# Patient Record
Sex: Male | Born: 1945 | Race: Black or African American | Hispanic: No | Marital: Married | Smoking: Former smoker
Health system: Southern US, Community
[De-identification: ages and names within clinical notes are randomized; demographics above are authoritative.]

## PROBLEM LIST (undated history)

## (undated) DIAGNOSIS — R61 Generalized hyperhidrosis: Secondary | ICD-10-CM

## (undated) DIAGNOSIS — R1013 Epigastric pain: Secondary | ICD-10-CM

## (undated) DIAGNOSIS — C61 Malignant neoplasm of prostate: Secondary | ICD-10-CM

## (undated) DIAGNOSIS — C169 Malignant neoplasm of stomach, unspecified: Secondary | ICD-10-CM

## (undated) DIAGNOSIS — M549 Dorsalgia, unspecified: Secondary | ICD-10-CM

## (undated) DIAGNOSIS — M1990 Unspecified osteoarthritis, unspecified site: Secondary | ICD-10-CM

## (undated) DIAGNOSIS — R112 Nausea with vomiting, unspecified: Secondary | ICD-10-CM

## (undated) DIAGNOSIS — N529 Male erectile dysfunction, unspecified: Secondary | ICD-10-CM

## (undated) DIAGNOSIS — K279 Peptic ulcer, site unspecified, unspecified as acute or chronic, without hemorrhage or perforation: Secondary | ICD-10-CM

## (undated) DIAGNOSIS — N138 Other obstructive and reflux uropathy: Secondary | ICD-10-CM

## (undated) DIAGNOSIS — D649 Anemia, unspecified: Secondary | ICD-10-CM

## (undated) DIAGNOSIS — F329 Major depressive disorder, single episode, unspecified: Secondary | ICD-10-CM

## (undated) DIAGNOSIS — Z923 Personal history of irradiation: Secondary | ICD-10-CM

## (undated) DIAGNOSIS — E7800 Pure hypercholesterolemia, unspecified: Secondary | ICD-10-CM

## (undated) DIAGNOSIS — R12 Heartburn: Secondary | ICD-10-CM

## (undated) DIAGNOSIS — R0902 Hypoxemia: Secondary | ICD-10-CM

## (undated) DIAGNOSIS — F419 Anxiety disorder, unspecified: Secondary | ICD-10-CM

## (undated) DIAGNOSIS — K219 Gastro-esophageal reflux disease without esophagitis: Secondary | ICD-10-CM

## (undated) DIAGNOSIS — I10 Essential (primary) hypertension: Secondary | ICD-10-CM

## (undated) DIAGNOSIS — Z9289 Personal history of other medical treatment: Secondary | ICD-10-CM

## (undated) DIAGNOSIS — IMO0002 Reserved for concepts with insufficient information to code with codable children: Secondary | ICD-10-CM

## (undated) DIAGNOSIS — R0600 Dyspnea, unspecified: Secondary | ICD-10-CM

## (undated) DIAGNOSIS — G4730 Sleep apnea, unspecified: Secondary | ICD-10-CM

## (undated) DIAGNOSIS — N401 Enlarged prostate with lower urinary tract symptoms: Secondary | ICD-10-CM

## (undated) DIAGNOSIS — F32A Depression, unspecified: Secondary | ICD-10-CM

## (undated) DIAGNOSIS — Z9889 Other specified postprocedural states: Secondary | ICD-10-CM

## (undated) DIAGNOSIS — K5900 Constipation, unspecified: Secondary | ICD-10-CM

## (undated) DIAGNOSIS — J189 Pneumonia, unspecified organism: Secondary | ICD-10-CM

## (undated) DIAGNOSIS — N189 Chronic kidney disease, unspecified: Secondary | ICD-10-CM

## (undated) HISTORY — DX: Unspecified osteoarthritis, unspecified site: M19.90

## (undated) HISTORY — DX: Reserved for concepts with insufficient information to code with codable children: IMO0002

## (undated) HISTORY — DX: Hypoxemia: R09.02

## (undated) HISTORY — DX: Malignant neoplasm of prostate: C61

## (undated) HISTORY — DX: Dorsalgia, unspecified: M54.9

## (undated) HISTORY — DX: Male erectile dysfunction, unspecified: N52.9

## (undated) HISTORY — DX: Other obstructive and reflux uropathy: N40.1

## (undated) HISTORY — DX: Major depressive disorder, single episode, unspecified: F32.9

## (undated) HISTORY — DX: Malignant neoplasm of stomach, unspecified: C16.9

## (undated) HISTORY — DX: Generalized hyperhidrosis: R61

## (undated) HISTORY — DX: Anxiety disorder, unspecified: F41.9

## (undated) HISTORY — PX: TONSILLECTOMY: SUR1361

## (undated) HISTORY — DX: Depression, unspecified: F32.A

## (undated) HISTORY — DX: Anemia, unspecified: D64.9

## (undated) HISTORY — DX: Heartburn: R12

## (undated) HISTORY — DX: Other obstructive and reflux uropathy: N13.8

## (undated) HISTORY — DX: Pure hypercholesterolemia, unspecified: E78.00

## (undated) HISTORY — DX: Essential (primary) hypertension: I10

---

## 2003-07-12 ENCOUNTER — Encounter: Admission: RE | Admit: 2003-07-12 | Discharge: 2003-07-12 | Payer: Self-pay | Admitting: Internal Medicine

## 2003-07-12 IMAGING — CR DG FOOT COMPLETE 3+V*R*
3 series · 3 of 3 positions shown · non-contrast
Comparison: none

CLINICAL DATA: Right foot pain in the region of the first metatarsal. 
 COMPLETE RIGHT FOOT 
 Three views of the right foot demonstrate mild inferior calcaneal spur formation.  No fracture or dislocation. 
 IMPRESSION
 Mild inferior calcaneal spur formation.

[view not recorded (1 of 3)]
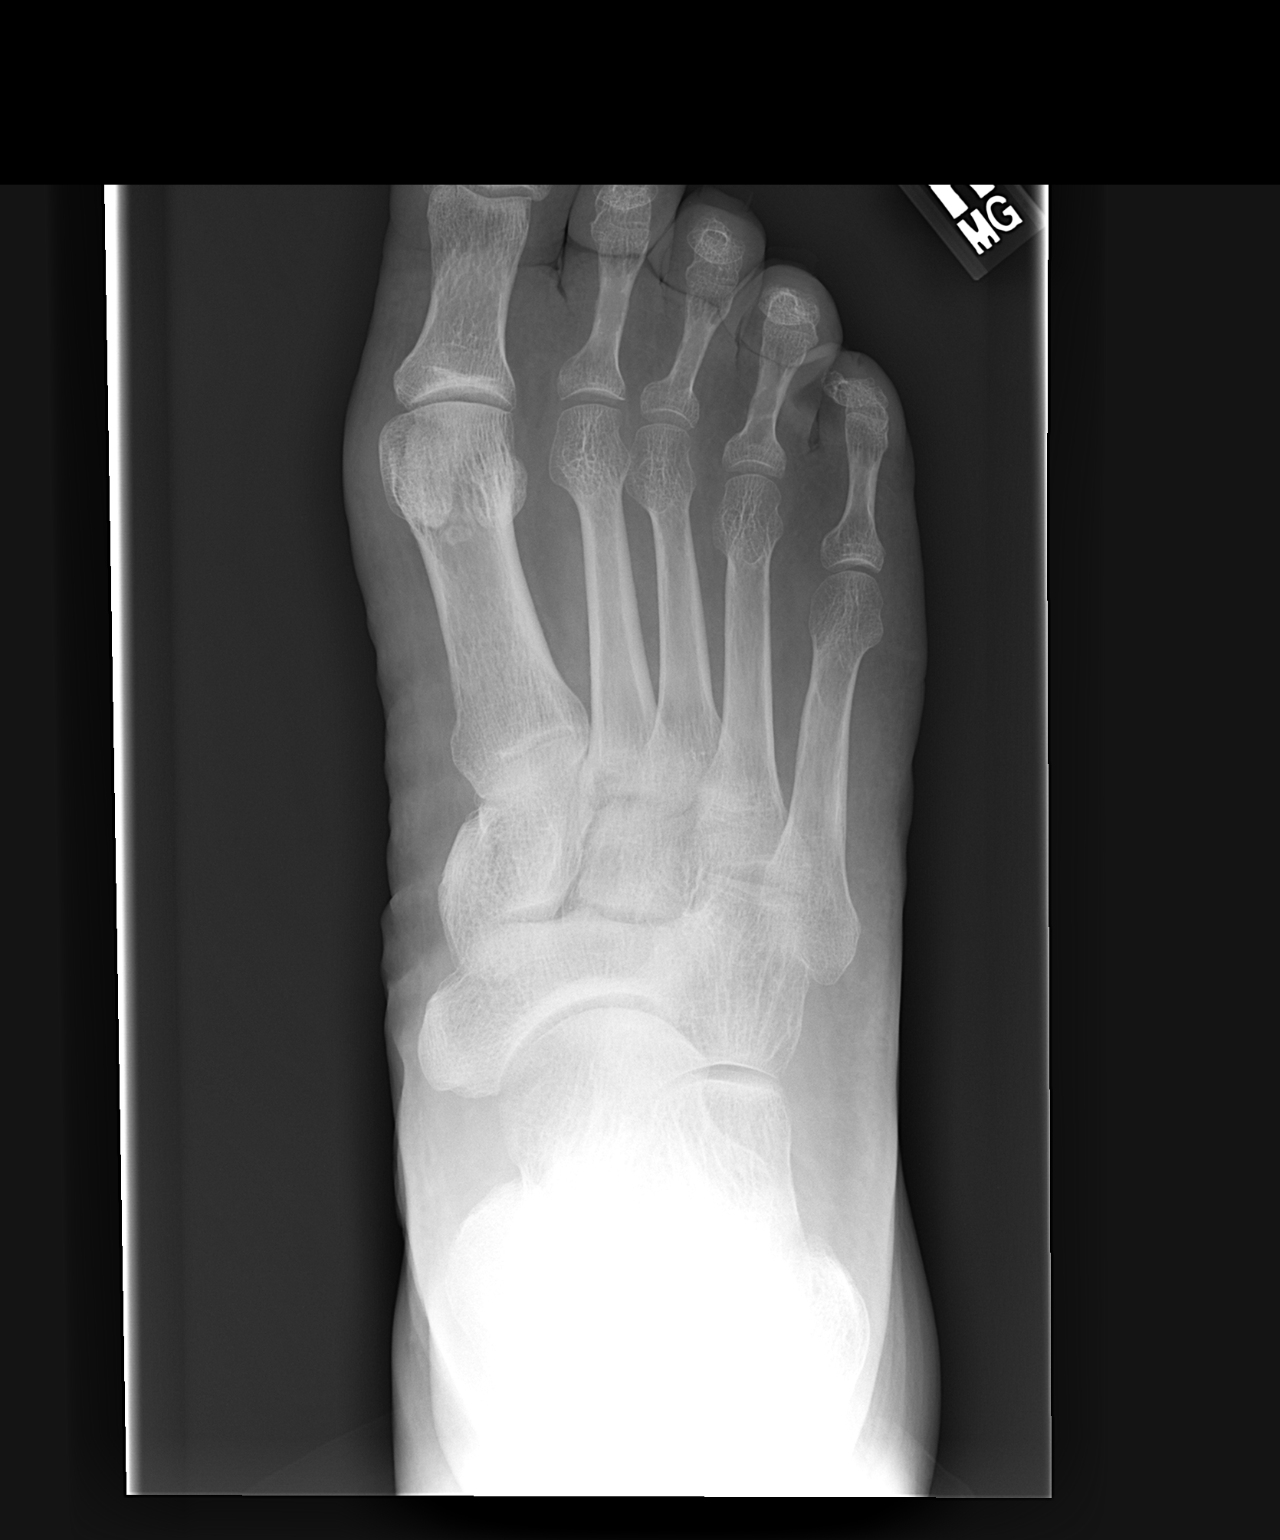

[view not recorded (2 of 3)]
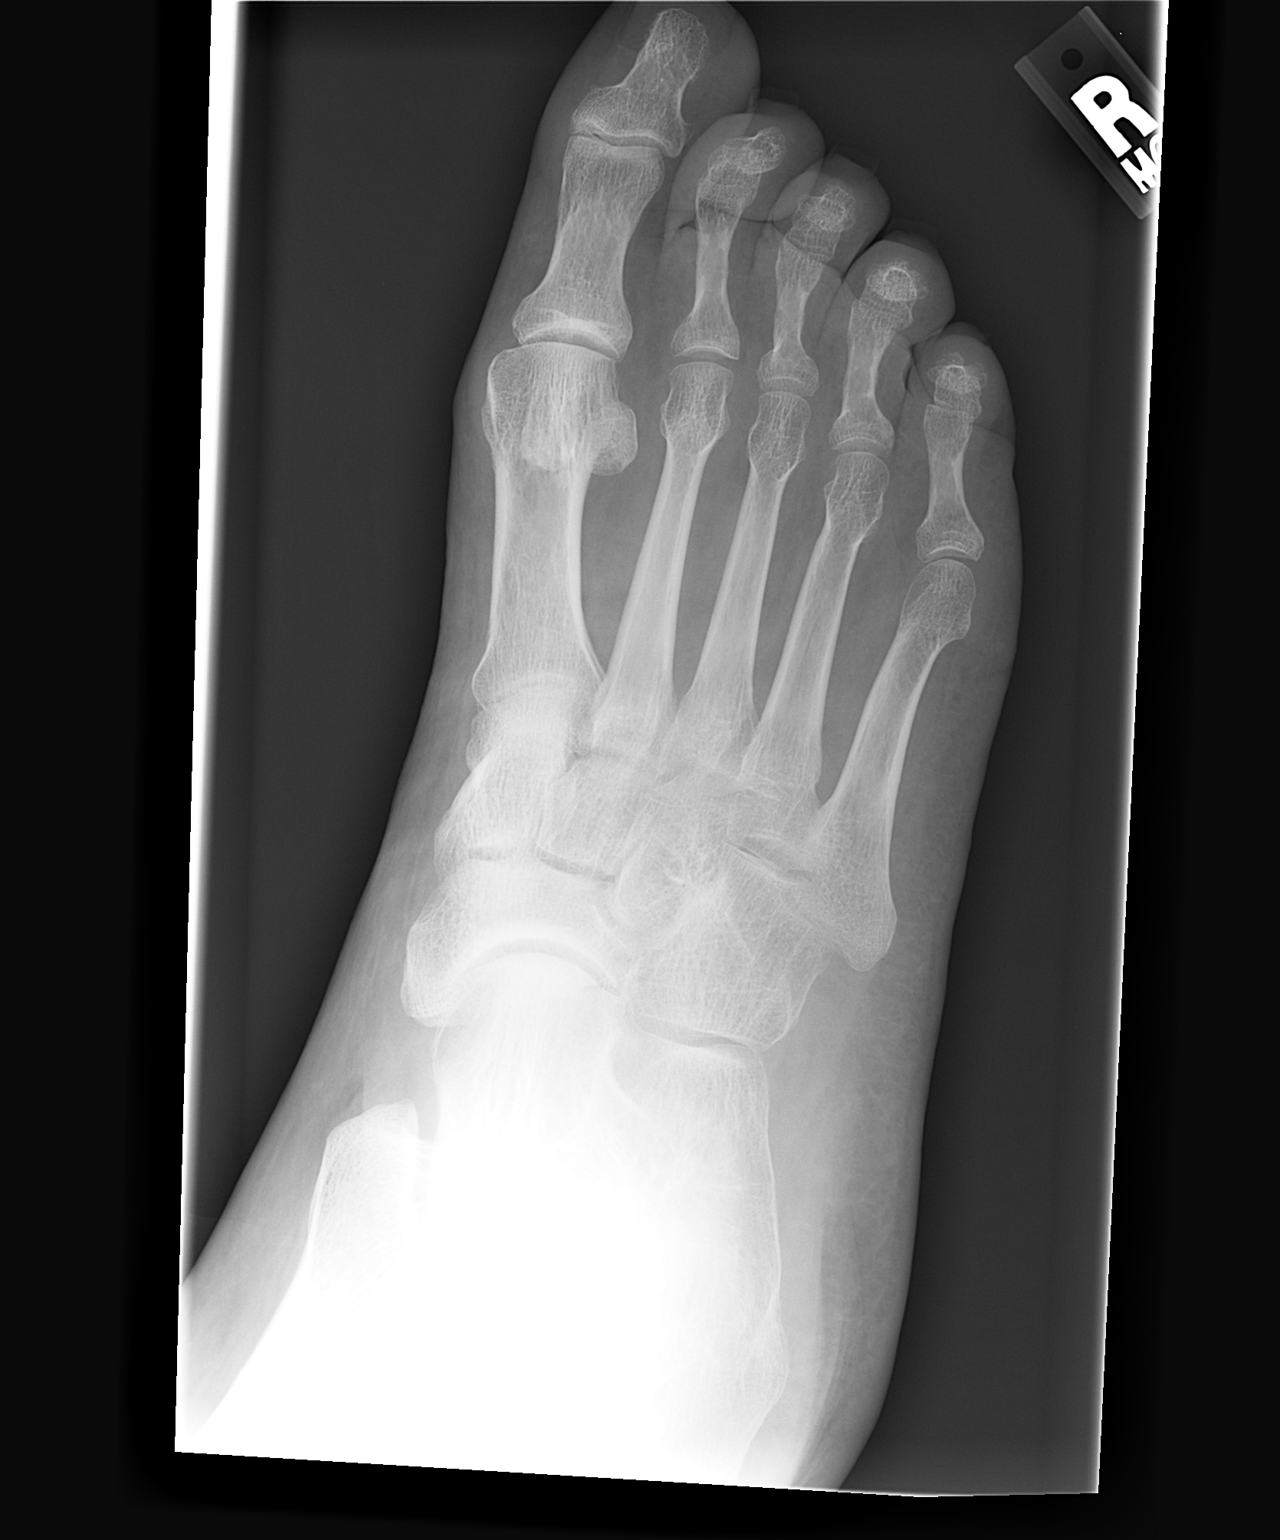

[view not recorded (3 of 3)]
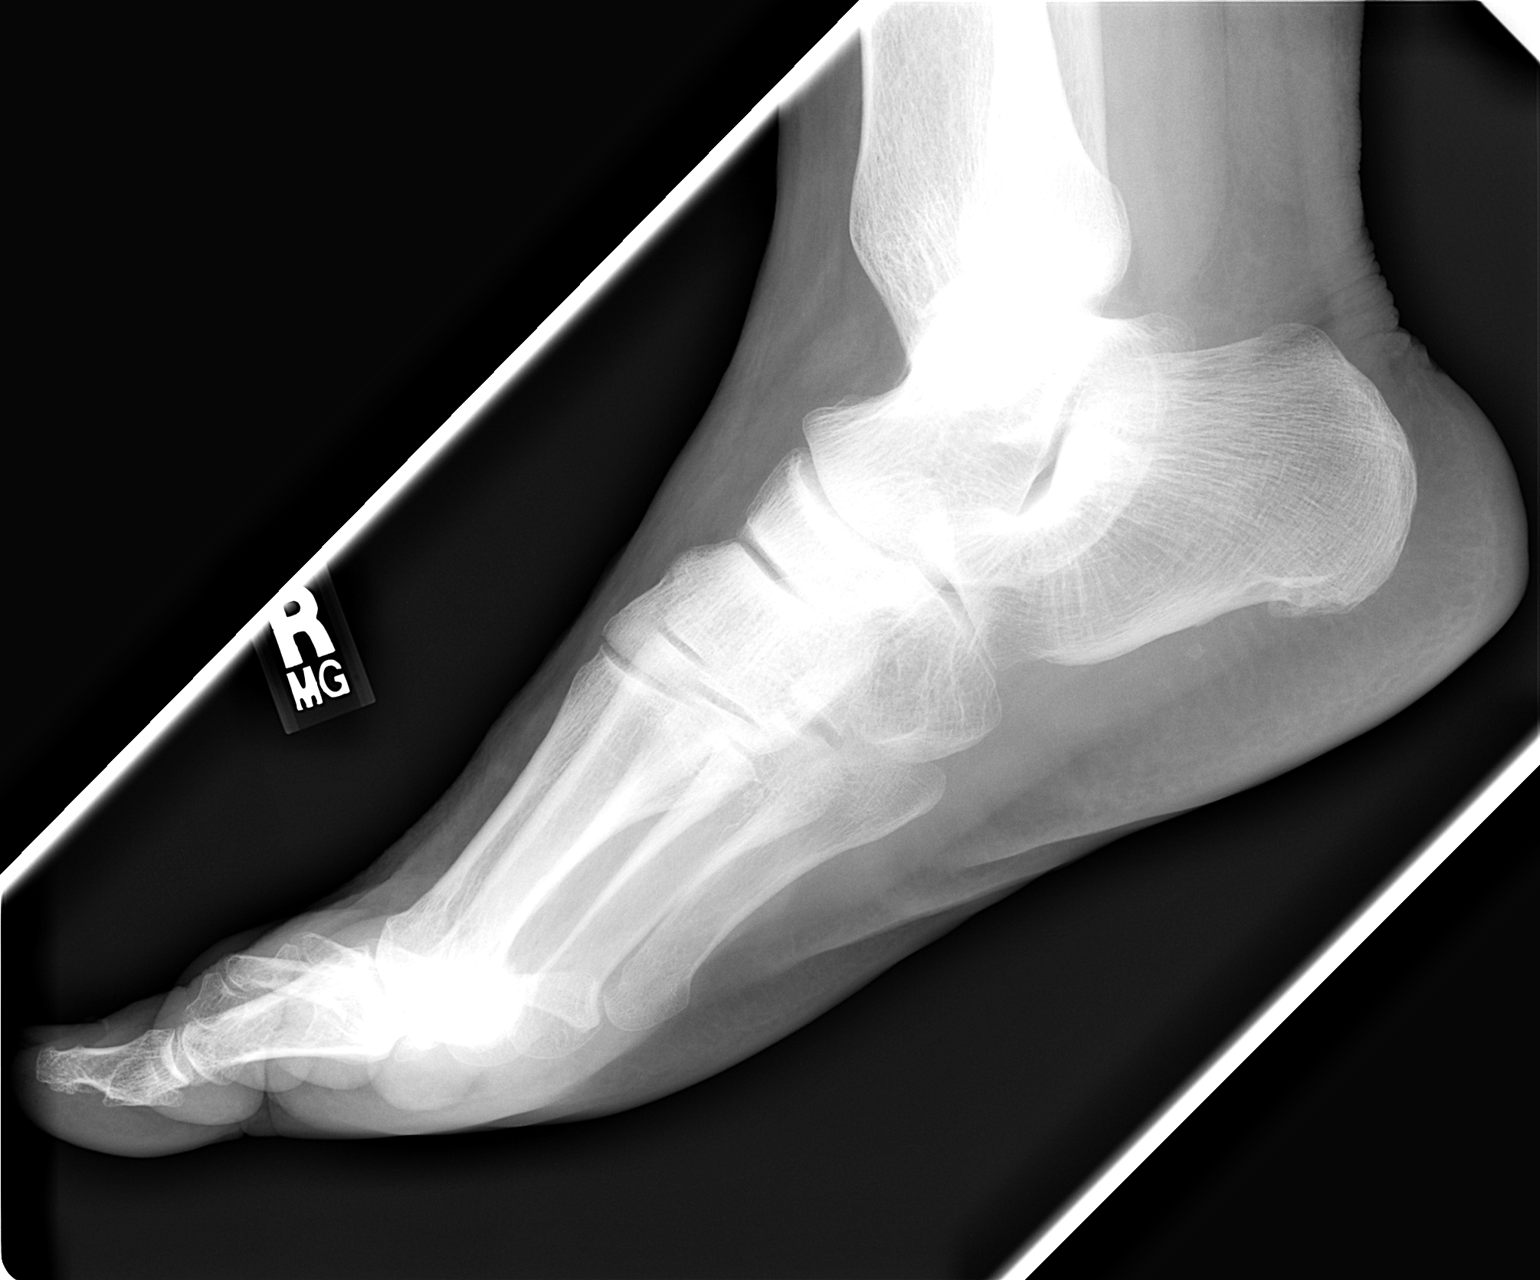

[3 of 3 positions shown; findings below may reference images not displayed]

## 2004-08-09 ENCOUNTER — Emergency Department (HOSPITAL_COMMUNITY): Admission: EM | Admit: 2004-08-09 | Discharge: 2004-08-09 | Payer: Self-pay | Admitting: Emergency Medicine

## 2004-08-09 IMAGING — CT CT HEAD W/O CM
1 of 2 series · 13 of 30 positions shown, 17 images · IV contrast (agent unspecified)
Comparison: None

CLINICAL DATA: Headache.
TECHNIQUE: 5mm collimated images were obtained from the base of the skull
through the vertex according to standard protocol without contrast.

HEAD CT WITHOUT CONTRAST:

[Series 2: brain · axial · 0.47mm/px · z∈[+178,+309]mm · 13 of 32 slices shown, 17 images]
[im 3/32  brain]
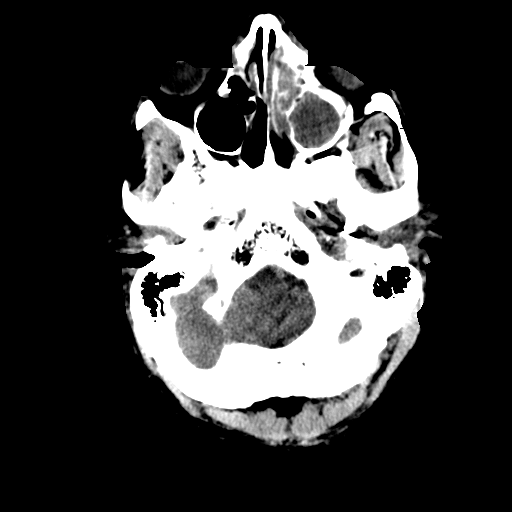
[im 3/32  bone]
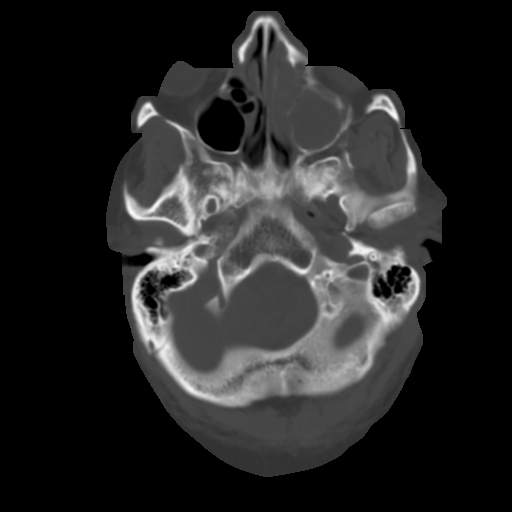
[im 5/32  brain]
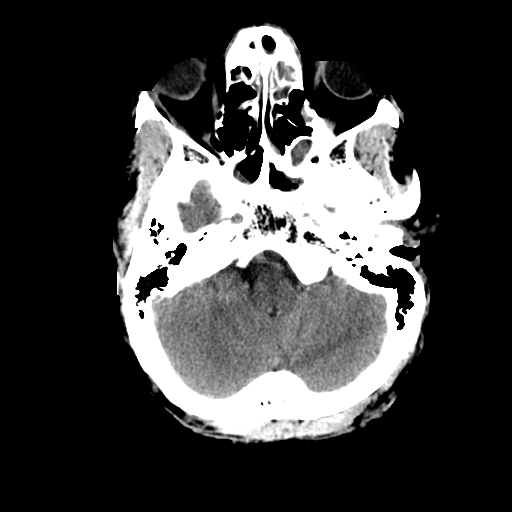
[im 7/32  brain]
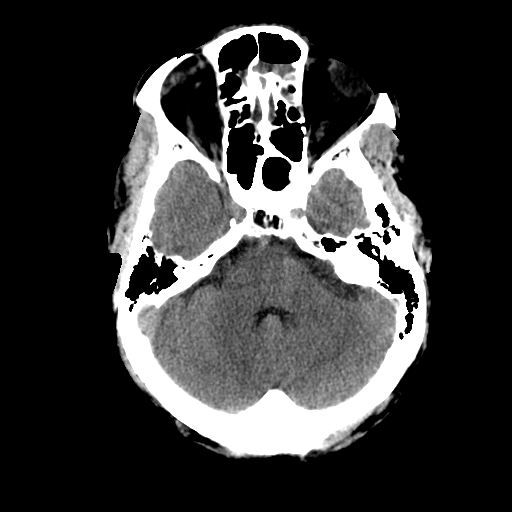
[im 9/32  brain]
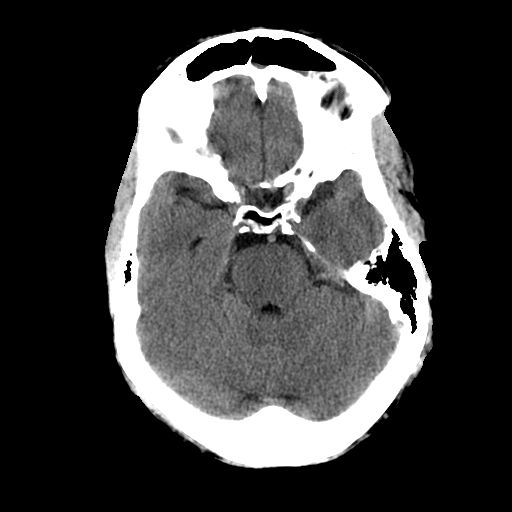
[im 12/32  brain]
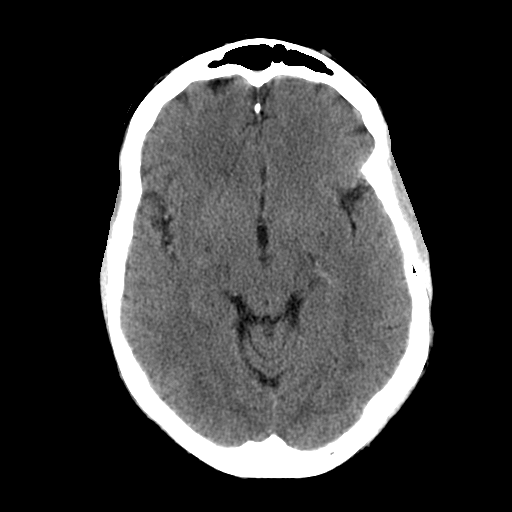
[im 12/32  bone]
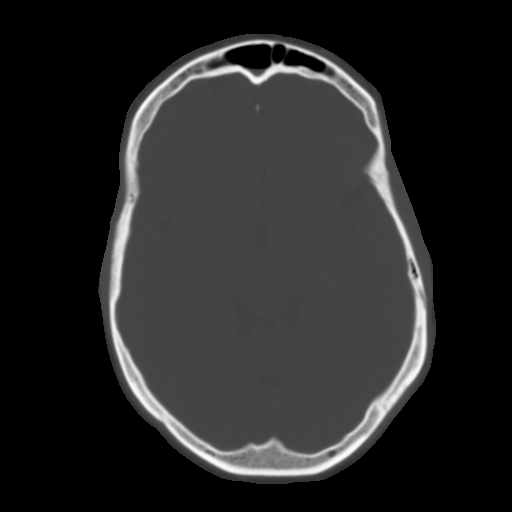
[im 14/32  brain]
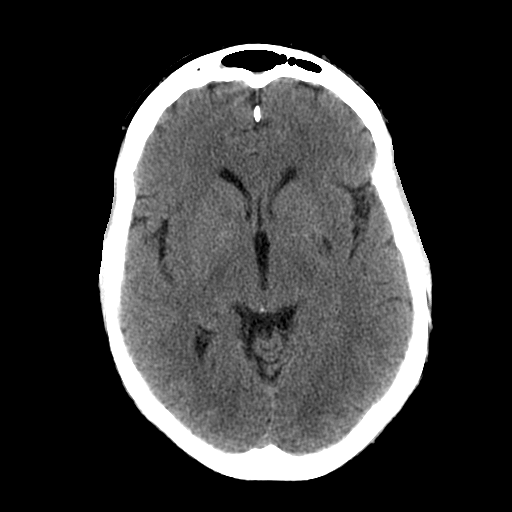
[im 16/32  brain]
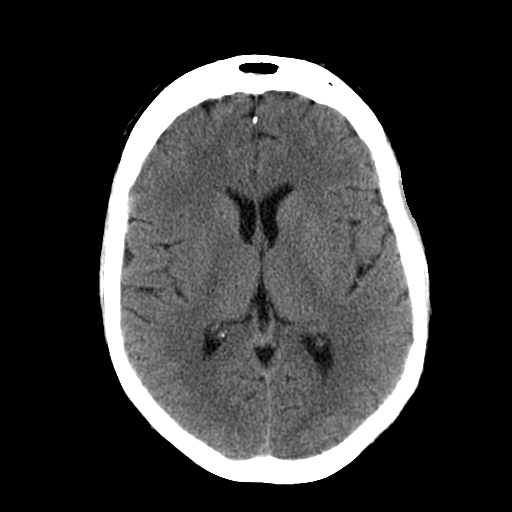
[im 18/32  brain]
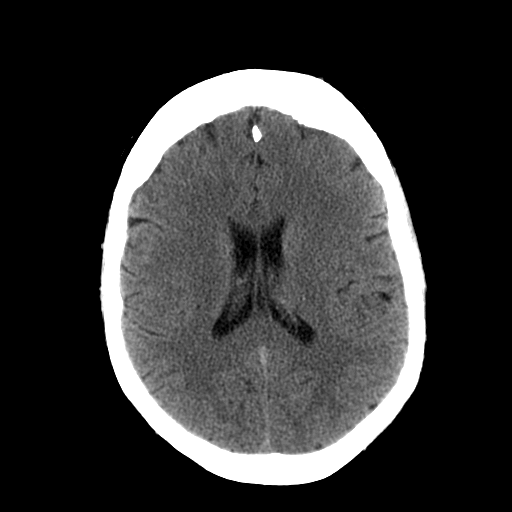
[im 20/32  brain]
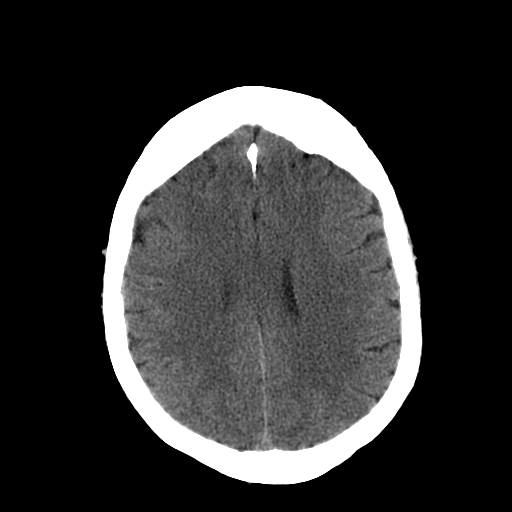
[im 20/32  bone]
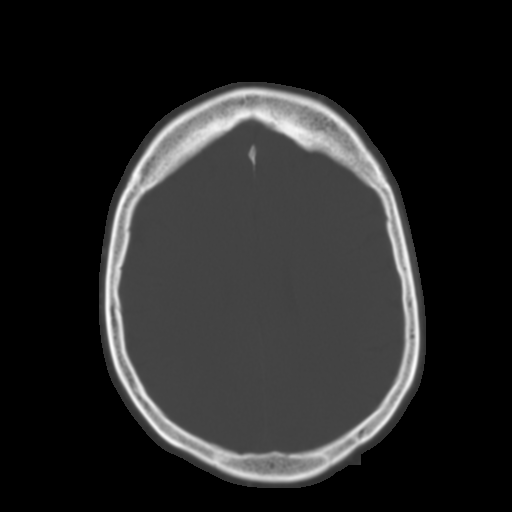
[im 23/32  brain]
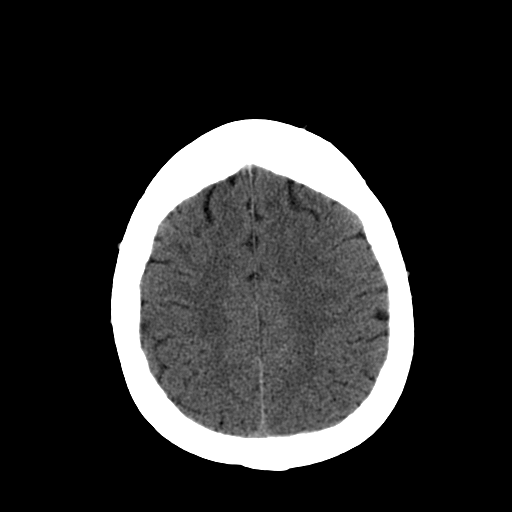
[im 25/32  brain]
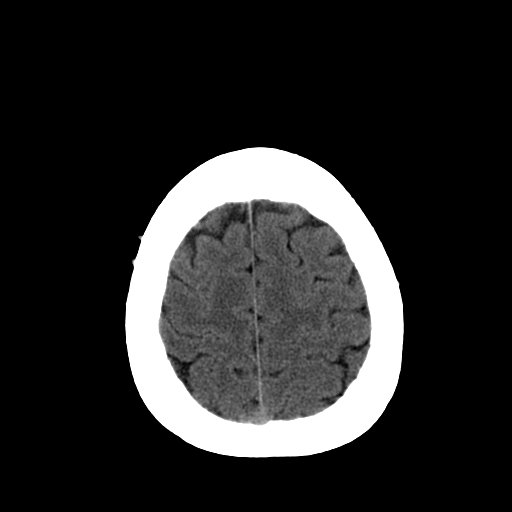
[im 27/32  brain]
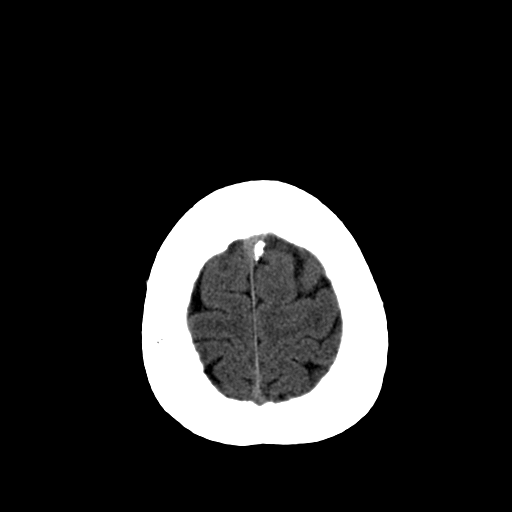
[im 29/32  brain]
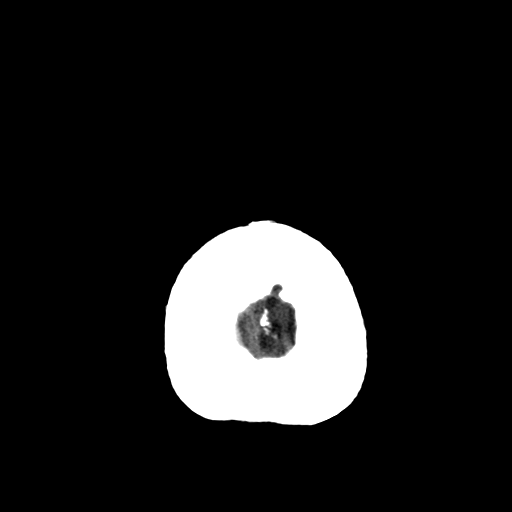
[im 29/32  bone]
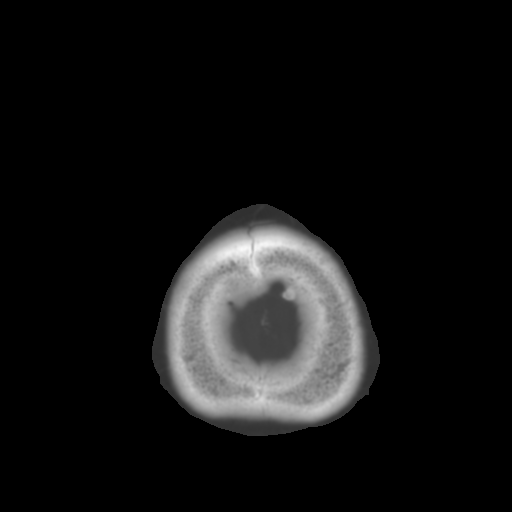

[13 of 30 positions shown; findings below may reference images not displayed]

FINDINGS: There is no evidence for acute hemorrhage, hydrocephalus, mass-effect
or abnormal extra-axial fluid collection.  No definite CT evidence for acute
ischemia. Bone windows demonstrate complete opacification of the visualized
portions of the left maxillary sinus and prominent mucosal thickening in the
left nasal airway. Sinus disease is seen in scattered ethmoid air cells and the
sphenoid sinuses and an air-fluid level is present in the frontal sinuses.
IMPRESSION: No acute intracranial abnormality. 

Substantial paranasal sinus disease with complete opacification of the
visualized left maxillary sinus and an air-fluid level in the frontal sinuses.
Features are consistent with an acute or chronic process.

## 2004-08-11 ENCOUNTER — Emergency Department (HOSPITAL_COMMUNITY): Admission: EM | Admit: 2004-08-11 | Discharge: 2004-08-11 | Payer: Self-pay | Admitting: Family Medicine

## 2005-12-12 ENCOUNTER — Emergency Department (HOSPITAL_COMMUNITY): Admission: EM | Admit: 2005-12-12 | Discharge: 2005-12-12 | Payer: Self-pay | Admitting: Family Medicine

## 2006-11-05 ENCOUNTER — Ambulatory Visit: Payer: Self-pay | Admitting: Gastroenterology

## 2006-11-23 ENCOUNTER — Ambulatory Visit: Payer: Self-pay | Admitting: Gastroenterology

## 2006-11-23 ENCOUNTER — Encounter: Payer: Self-pay | Admitting: Gastroenterology

## 2006-11-23 DIAGNOSIS — K648 Other hemorrhoids: Secondary | ICD-10-CM

## 2006-11-23 DIAGNOSIS — D131 Benign neoplasm of stomach: Secondary | ICD-10-CM | POA: Insufficient documentation

## 2006-11-23 DIAGNOSIS — K640 First degree hemorrhoids: Secondary | ICD-10-CM | POA: Insufficient documentation

## 2006-11-23 DIAGNOSIS — K2990 Gastroduodenitis, unspecified, without bleeding: Secondary | ICD-10-CM

## 2006-11-23 DIAGNOSIS — K2970 Gastritis, unspecified, without bleeding: Secondary | ICD-10-CM | POA: Insufficient documentation

## 2006-11-23 DIAGNOSIS — D126 Benign neoplasm of colon, unspecified: Secondary | ICD-10-CM | POA: Insufficient documentation

## 2006-11-24 HISTORY — PX: OTHER SURGICAL HISTORY: SHX169

## 2007-03-19 DIAGNOSIS — D509 Iron deficiency anemia, unspecified: Secondary | ICD-10-CM | POA: Insufficient documentation

## 2007-03-19 DIAGNOSIS — F329 Major depressive disorder, single episode, unspecified: Secondary | ICD-10-CM

## 2007-03-19 DIAGNOSIS — F3289 Other specified depressive episodes: Secondary | ICD-10-CM | POA: Insufficient documentation

## 2007-03-19 DIAGNOSIS — I10 Essential (primary) hypertension: Secondary | ICD-10-CM | POA: Insufficient documentation

## 2010-05-28 NOTE — Assessment & Plan Note (Signed)
[facility] OFFICE NOTE   CARLE, LAQUE                  MRN:          [id number]  DATE:11/05/2006                            DOB:          1945-11-26    REFERRING PHYSICIAN:  Berneta [name], M.D.   REASON FOR CONSULTATION:  Iron deficiency anemia and weight-loss.   HISTORY OF PRESENT ILLNESS:  Jacob [name] is a 64 year old African-  American male that I previously evaluated in 2005.  He underwent  screening colonoscopy in March of 2005, which showed multiple small  hyperplastic polyps.  He has noted occasional problems with gas and  bloating and some rectal itching that he attributes to hemorrhoids.  He  has lost about 30 pounds over the past eight months.  He also has noted  fatigue and was recently found to have a microcytic anemia with an MCV  of 74, hemoglobin of 9.3 and an iron saturation of 4%.  Fecal occult  blood tests are pending at the time of this dictation.  He notes no  melena, hematochezia, change in stool caliber, abdominal pain, nausea or  vomiting, dysphagia, or odynophagia.   FAMILY HISTORY:  Negative for colon cancer, colon polyps or inflammatory  bowel disease.   PAST MEDICAL HISTORY:  Hypertension  Depression.   CURRENT MEDICATIONS:  Listed on the chart, updated and reviewed.   MEDICATION ALLERGIES:  None known.   SOCIAL HISTORY:  Per the handwritten form.   REVIEW OF SYSTEMS:  Per the handwritten form.   PHYSICAL EXAM:  Well-developed, overweight, in no acute distress.  Height 6 feet 2, weight 288 pounds.  Blood pressure is 150/88, pulse 80  and regular.  HEENT EXAM:  Anicteric sclerae, oropharynx clear.  CHEST:  Clear to auscultation bilaterally.  CARDIAC:  Regular rate and rhythm without murmurs appreciated.  ABDOMEN:  Soft, nontender, nondistended.  Normoactive bowel sounds, no  palpable organomegaly, masses or hernias.  RECTAL EXAMINATION:  Deferred to time of  colonoscopy.  EXTREMITIES:  Without clubbing, cyanosis or edema.  NEUROLOGIC:  Alert and oriented times three.  Grossly nonfocal.   ASSESSMENT AND [name]:  Iron deficiency anemia and weight-loss.  Rule out  colorectal neoplasms, ulcer disease and other disorders.  Continue iron  replacement.  Risks, benefits, and  alternatives to colonoscopy with possible biopsy and possible  polypectomy and upper  endoscopy with possible biopsy were discussed with the patient.  He  consents to proceed.  The procedures will be scheduled electively.     Jacob [name]. Fuller Plan, MD, [facility]  Electronically Signed    MTS/MedQ  DD: 11/16/2006  DT: 11/16/2006  Job #: [id number]

## 2011-08-14 DIAGNOSIS — C61 Malignant neoplasm of prostate: Secondary | ICD-10-CM | POA: Insufficient documentation

## 2011-08-14 HISTORY — DX: Malignant neoplasm of prostate: C61

## 2011-08-14 HISTORY — PX: PROSTATE BIOPSY: SHX241

## 2011-09-24 ENCOUNTER — Encounter: Payer: Self-pay | Admitting: *Deleted

## 2011-09-25 ENCOUNTER — Ambulatory Visit
Admission: RE | Admit: 2011-09-25 | Discharge: 2011-09-25 | Disposition: A | Discharge status: Home / Self Care | Payer: Managed Care, Other (non HMO) | Source: Ambulatory Visit | Attending: Radiation Oncology | Admitting: Radiation Oncology

## 2011-09-25 ENCOUNTER — Encounter: Payer: Self-pay | Admitting: Radiation Oncology

## 2011-09-25 VITALS — BP 136/84 | HR 76 | Temp 98.8°F | Resp 20 | Wt 293.7 lb

## 2011-09-25 DIAGNOSIS — C61 Malignant neoplasm of prostate: Secondary | ICD-10-CM

## 2011-09-25 DIAGNOSIS — Z79899 Other long term (current) drug therapy: Secondary | ICD-10-CM | POA: Insufficient documentation

## 2011-09-25 DIAGNOSIS — I10 Essential (primary) hypertension: Secondary | ICD-10-CM | POA: Diagnosis not present

## 2011-09-25 DIAGNOSIS — E7800 Pure hypercholesterolemia, unspecified: Secondary | ICD-10-CM | POA: Insufficient documentation

## 2011-09-25 DIAGNOSIS — Z87891 Personal history of nicotine dependence: Secondary | ICD-10-CM | POA: Diagnosis not present

## 2011-09-25 DIAGNOSIS — Z510 Encounter for antineoplastic radiation therapy: Secondary | ICD-10-CM | POA: Diagnosis present

## 2011-09-25 DIAGNOSIS — Z800 Family history of malignant neoplasm of digestive organs: Secondary | ICD-10-CM | POA: Diagnosis not present

## 2011-09-25 NOTE — Progress Notes (Addendum)
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 male who is seen today for the courtesy of Dr. Janice Norrie for discussion of possible radiation therapy in the management of his stage TI C. intermediate risk adenocarcinoma prostate. His PSA was 4.46 August of 2012, rising to 5.38 by May 2013. He was sent by Dr. Dagmar Hait to Dr. Janice Norrie for further evaluation. The patient underwent ultrasound-guided biopsies on 08/14/2011. His then have Gleason 7 (3+4) involving 40% of one core from the right mid gland and Gleason 6 (3+3) involving 40% of one core from the right lateral base and 50% of one core from the right lateral mid gland. His gland volume is approximately 59 cc. He is doing reasonably well from a GU and GI standpoint. His I PSS score 7. He does have erectile dysfunction.  PREVIOUS RADIATION THERAPY: No   PAST MEDICAL HISTORY:  has a past medical history of Prostate cancer (08/14/11); Anemia; Hypertension; Depression; BPH with obstruction/lower urinary tract symptoms; ED (erectile dysfunction); Night sweats; Back pain; Heartburn; Ulcer; Hypercholesterolemia; Arthritis; and Anxiety.     PAST SURGICAL HISTORY:  Past Surgical History  Procedure Date  . Prostate biopsy 08/14/11    Adenocarcinoma/volume=58.51cc,gleason=3+3=6 & 3+4=7  . Colon polyps bx 11/24/06    colon,transverse and rectosigmoid polyps:tubular adenomas and hyperplastic polyps,no high grade dysplasia or malignancy   . Gastric bx 11/24/06    chronic active gastritis,with metaplasia and focal changaes of xanthelasma  . Duodenal bx 11/24/06    benign  . Tonsillectomy     66 years  old     FAMILY HISTORY: His father died following a stroke at age 50. His mother died with metastatic colon cancer date 27. No family history of prostate cancer.  SOCIAL HISTORY:  reports that he quit smoking about 6 months ago. His smoking use included Cigarettes. He has a 45 pack-year smoking history. He has never used smokeless tobacco. He reports that he drinks about 1.2 ounces of alcohol per week. He reports that he does not use illicit drugs. Married, 3 children, one stepchild. 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   ALLERGIES: Review of patient's allergies indicates no known allergies.   MEDICATIONS:  Current Outpatient Prescriptions  Medication Sig Dispense Refill  . allopurinol (ZYLOPRIM) 100 MG tablet Take 100 mg by mouth as needed.       Marland Kitchen buPROPion (WELLBUTRIN) 100 MG tablet Take 150 mg by mouth daily.       . diazepam (VALIUM) 10 MG tablet Take 10 mg by mouth every 6 (six) hours as needed. Takes q night to sleep      . furosemide (LASIX) 20 MG tablet Take 20 mg by mouth daily.       Marland Kitchen gabapentin (NEURONTIN) 800 MG tablet Take 800 mg by mouth daily.       . Multiple Vitamins-Iron (ONE-TABLET-DAILY/IRON) TABS Take 1 tablet by mouth daily.      . Multiple Vitamins-Minerals (MULTIVITAMIN PO) Take 1 tablet by mouth daily.      . Nebivolol HCl (BYSTOLIC) 20 MG TABS Take 20  mg by mouth daily. Takes 1/2 tab(10mg )      . valsartan (DIOVAN) 320 MG tablet Take 320 mg by mouth daily.      Marland Kitchen VERAPAMIL HCL ER, CO, PO Take 240 mg by mouth daily.      Marland Kitchen VITAMIN D, CHOLECALCIFEROL, PO Take 1 capsule by mouth daily.         REVIEW OF SYSTEMS:  Pertinent items are noted in HPI.    PHYSICAL EXAM:  weight is 293 lb 11.2 oz (133.221 kg). His temperature is 98.8 F (37.1 C). His blood pressure is 136/84 and his pulse is 76. His respiration is 20.   Head and neck examination: Grossly unremarkable. Nodes: Without palpable cervical or supraclavicular  lymphadenopathy. Chest: Lungs clear. Back: Without spinal or CVA tenderness. Heart: Regular rate and rhythm. Abdomen: Obese, without hepatomegaly. Genitalia: Unremarkable to inspection. Rectal: Prostate gland is slightly enlarged and is without focal induration or nodularity. Extremities: Without edema. Neurologic examination: Grossly nonfocal.   LABORATORY DATA:  No results found for this basename: WBC, HGB, HCT, MCV, PLT   No results found for this basename: NA, K, CL, CO2   No results found for this basename: ALT, AST, GGT, ALKPHOS, BILITOT   PSA 5.66 from 07/10/2011.   IMPRESSION: Stage TI C. intermediate risk adenocarcinoma prostate. I explained to the patient that his prognosis is related to his PSA level, stage, Gleason score. His PSA level and stage are favorable, however his Gleason score of 7 is of intermediate favorability. We discussed surgery versus close surveillance versus radiation therapy. Radiation therapy options include seed implantation alone versus 8 weeks of external beam/IMRT. Since he does have low volume Gleason 7, he would be a candidate for seed implantation alone. He is quite possible that he would require downsizing based on his current volume of 59 cc. We discussed having a CT arch study to make this determination. We also discussed external beam/IMRT which seemed more convenient for him despite having to come in for 8 weeks, 5 days a week. We discussed the potential acute and late toxicities of radiation therapy. After lengthy discussion, he wants to avoid surgery and is most interested in external beam/IMRT. I think this would be a reasonable choice. We talked about the need for placement of 3 gold seeds for image guidance/prostate localization, and we will kindly asked Dr. Janice Norrie to set this up for [place]. He then return for simulation/treatment planning. We also discussed having a comfortably full bladder during his course of treatment and also for his simulation/treatment  planning. Consent is signed today.   PLAN: As discussed above.   I spent 60 minutes minutes face to face with the patient and more than 50% of that time was spent in counseling and/or coordination of care.

## 2011-09-25 NOTE — Progress Notes (Signed)
Please see the Nurse Progress Note in the MD Initial Consult Encounter for this patient. 

## 2011-09-25 NOTE — Addendum Note (Signed)
Encounter addended by: Rexene Edison, MD on: 09/25/2011  8:55 AM<BR>     Documentation filed: Flowsheet VN, Notes Section

## 2011-09-25 NOTE — Progress Notes (Signed)
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

## 2011-09-29 ENCOUNTER — Telehealth: Payer: Self-pay | Admitting: *Deleted

## 2011-09-29 NOTE — Telephone Encounter (Signed)
xxxx 

## 2011-09-29 NOTE — Telephone Encounter (Signed)
Called patient to inform of gold seed placement for 11-06-11 at 9:30 am at Dr. Janice Norrie' Office, lvm for a return call.

## 2011-09-30 ENCOUNTER — Telehealth: Payer: Self-pay | Admitting: Gastroenterology

## 2011-09-30 NOTE — Telephone Encounter (Signed)
He can do colon/egd anytime between now and radiation seed implantation or 3 months afterward. Can check with his urologist and radiation oncologist to see if they have other advice or restrictions.

## 2011-09-30 NOTE — Telephone Encounter (Signed)
Dr. Fuller Plan the colon/endo recall is due in November.  Should the recall procedures be performed prior to seed radiation tx?

## 2011-09-30 NOTE — Telephone Encounter (Signed)
I have left a message for Jacob [name] to return my call

## 2011-10-01 NOTE — Telephone Encounter (Signed)
Jacob [name] is advised of Dr. Lynne Leader recommendations and to get clearance from urology and radiation oncology prior to scheduling the procedures.

## 2011-10-06 ENCOUNTER — Telehealth: Payer: Self-pay | Admitting: *Deleted

## 2011-10-06 NOTE — Telephone Encounter (Signed)
CALLED PATIENT TO INFORM THAT GOLD SEED PLACEMENT HAS BEEN MOVED UP TO 10-14-11 ARRIVAL TIME - 3:30 PM, LVM FOR A RETURN CALL

## 2011-10-10 ENCOUNTER — Telehealth: Payer: Self-pay | Admitting: *Deleted

## 2011-10-10 NOTE — Telephone Encounter (Signed)
Called patient to inform of sim appt. For 10-16-11 at 9:00 am, spoke with patient and he is aware of this appt.

## 2011-10-13 NOTE — Addendum Note (Signed)
Encounter addended by: Deirdre Evener, RN on: 10/13/2011  7:53 PM<BR>     Documentation filed: Charges VN

## 2011-10-16 ENCOUNTER — Ambulatory Visit: Payer: Managed Care, Other (non HMO) | Admitting: Radiation Oncology

## 2011-10-17 ENCOUNTER — Telehealth: Payer: Self-pay | Admitting: *Deleted

## 2011-10-17 NOTE — Telephone Encounter (Signed)
CALLED PATIENT TO REMIND OF APPT. FOR 10-20-11, LVM FOR A RETURN CALL.

## 2011-10-20 ENCOUNTER — Ambulatory Visit
Admission: RE | Admit: 2011-10-20 | Discharge: 2011-10-20 | Disposition: A | Discharge status: Home / Self Care | Payer: Managed Care, Other (non HMO) | Source: Ambulatory Visit | Attending: Radiation Oncology | Admitting: Radiation Oncology

## 2011-10-20 DIAGNOSIS — Z510 Encounter for antineoplastic radiation therapy: Secondary | ICD-10-CM | POA: Diagnosis not present

## 2011-10-20 DIAGNOSIS — C61 Malignant neoplasm of prostate: Secondary | ICD-10-CM

## 2011-10-20 NOTE — Progress Notes (Signed)
Simulation/treatment planning note: The patient was taken to the CT simulator. A Vac Loc custom immobilization device was constructed. A red rubber catheter was placed within the rectal vault. He was then catheterized and contrast instilled into the bladder/urethra. He was then scanned. I chose and isocenter along the Center of the prostate. I contoured the prostate, seminal vesicles, rectum, and bladder. Other avoidance structures including the femoral heads were contoured by dosimetry. I'm prescribing 7800 cGy in 40 sessions to the prostate PTV and 5600 cGy 40 sessions to his seminal vesicles. He is now ready for IMRT simulation/treatment planning.

## 2011-10-20 NOTE — Progress Notes (Signed)
Met with patient to discuss RO billing.  Patient had no concerns today. 

## 2011-10-21 ENCOUNTER — Encounter: Payer: Self-pay | Admitting: Radiation Oncology

## 2011-10-21 DIAGNOSIS — Z510 Encounter for antineoplastic radiation therapy: Secondary | ICD-10-CM | POA: Diagnosis not present

## 2011-10-21 NOTE — Progress Notes (Signed)
IMRT simulation/treatment planning note: The patient underwent IMRT simulation/treatment planning in the management of his carcinoma the prostate. IMRT was chosen to decrease the risk for both acute and late bladder and rectal toxicity compared to conventional or 3-D conformal radiation therapy. Dose volume histograms were obtained for the prostate and seminal vesicles in addition to avoidance structures including the rectum, bladder, and femoral heads. We met our avoidance structure goals, but had to compromise along the PTV margin posteriorly, still allowing the prostate to receive over 95% of the prescribed dose. Please see specific dose volume histograms in the electronic medical record. He will undergo daily KV imaging with a weekly cone beam CT scan to assess his bladder filling.

## 2011-10-27 ENCOUNTER — Encounter: Payer: Self-pay | Admitting: Gastroenterology

## 2011-11-03 ENCOUNTER — Ambulatory Visit
Admission: RE | Admit: 2011-11-03 | Discharge: 2011-11-03 | Disposition: A | Discharge status: Home / Self Care | Payer: Managed Care, Other (non HMO) | Source: Ambulatory Visit | Attending: Radiation Oncology | Admitting: Radiation Oncology

## 2011-11-03 VITALS — BP 159/93 | HR 72 | Temp 98.6°F | Wt 290.4 lb

## 2011-11-03 DIAGNOSIS — C61 Malignant neoplasm of prostate: Secondary | ICD-10-CM

## 2011-11-03 DIAGNOSIS — Z510 Encounter for antineoplastic radiation therapy: Secondary | ICD-10-CM | POA: Diagnosis not present

## 2011-11-03 NOTE — Progress Notes (Signed)
Weekly Management Note:  Site: Prostate Current Dose:  195  cGy Projected Dose: 7800  cGy  Narrative: The patient is seen today for routine under treatment assessment. CBCT/MVCT images/port films were reviewed. The chart was reviewed.   Excellent bladder filling today. No new GU or GI difficulties.  Physical Examination:  Filed Vitals:   11/03/11 1736  BP: 159/93  Pulse: 72  Temp: 98.6 F (37 C)  .  Weight: 290 lb 6.4 oz (131.725 kg). No change .  Impression: Tolerating radiation therapy well.  Plan: Continue radiation therapy as planned.

## 2011-11-03 NOTE — Progress Notes (Signed)
Please see associated weekly management note dictated earlier today.

## 2011-11-04 ENCOUNTER — Ambulatory Visit
Admission: RE | Admit: 2011-11-04 | Discharge: 2011-11-04 | Disposition: A | Discharge status: Home / Self Care | Payer: Managed Care, Other (non HMO) | Source: Ambulatory Visit | Attending: Radiation Oncology | Admitting: Radiation Oncology

## 2011-11-04 DIAGNOSIS — Z510 Encounter for antineoplastic radiation therapy: Secondary | ICD-10-CM | POA: Diagnosis not present

## 2011-11-04 DIAGNOSIS — C61 Malignant neoplasm of prostate: Secondary | ICD-10-CM

## 2011-11-04 NOTE — Progress Notes (Signed)
Post sim teaching, Radiation therapy and you book, my business card given, discussed signs/symptoms to report to staff/MD, sees MD/RN weekly and prn, teach back given

## 2011-11-05 ENCOUNTER — Ambulatory Visit
Admission: RE | Admit: 2011-11-05 | Discharge: 2011-11-05 | Disposition: A | Discharge status: Home / Self Care | Payer: Managed Care, Other (non HMO) | Source: Ambulatory Visit | Attending: Radiation Oncology | Admitting: Radiation Oncology

## 2011-11-05 ENCOUNTER — Ambulatory Visit: Payer: Managed Care, Other (non HMO)

## 2011-11-05 DIAGNOSIS — Z510 Encounter for antineoplastic radiation therapy: Secondary | ICD-10-CM | POA: Diagnosis not present

## 2011-11-06 ENCOUNTER — Ambulatory Visit: Payer: Managed Care, Other (non HMO)

## 2011-11-06 ENCOUNTER — Ambulatory Visit
Admission: RE | Admit: 2011-11-06 | Discharge: 2011-11-06 | Disposition: A | Discharge status: Home / Self Care | Payer: Managed Care, Other (non HMO) | Source: Ambulatory Visit | Attending: Radiation Oncology | Admitting: Radiation Oncology

## 2011-11-06 DIAGNOSIS — Z510 Encounter for antineoplastic radiation therapy: Secondary | ICD-10-CM | POA: Diagnosis not present

## 2011-11-07 ENCOUNTER — Ambulatory Visit: Payer: Managed Care, Other (non HMO)

## 2011-11-07 DIAGNOSIS — Z510 Encounter for antineoplastic radiation therapy: Secondary | ICD-10-CM | POA: Diagnosis not present

## 2011-11-10 ENCOUNTER — Ambulatory Visit
Admission: RE | Admit: 2011-11-10 | Discharge: 2011-11-10 | Disposition: A | Discharge status: Home / Self Care | Payer: Managed Care, Other (non HMO) | Source: Ambulatory Visit | Attending: Radiation Oncology | Admitting: Radiation Oncology

## 2011-11-10 ENCOUNTER — Encounter: Payer: Self-pay | Admitting: Radiation Oncology

## 2011-11-10 ENCOUNTER — Ambulatory Visit: Payer: Managed Care, Other (non HMO)

## 2011-11-10 VITALS — BP 134/80 | HR 74 | Temp 98.3°F | Resp 20 | Ht 74.0 in | Wt 290.7 lb

## 2011-11-10 DIAGNOSIS — Z510 Encounter for antineoplastic radiation therapy: Secondary | ICD-10-CM | POA: Diagnosis not present

## 2011-11-10 DIAGNOSIS — C61 Malignant neoplasm of prostate: Secondary | ICD-10-CM

## 2011-11-10 NOTE — Progress Notes (Signed)
Weekly Management Note:  Site: Prostate Current Dose:  1170  cGy Projected Dose: 7800  cGy  Narrative: The patient is seen today for routine under treatment assessment. CBCT/MVCT images/port films were reviewed. The chart was reviewed.   Bladder filling is satisfactory, but less than ideal. He did not feel that his bladder was particularly full today. No GU or GI difficulties.  Physical Examination:  Filed Vitals:   11/10/11 1107  BP: 134/80  Pulse: 74  Temp: 98.3 F (36.8 C)  Resp: 20  .  Weight: 290 lb 11.2 oz (131.861 kg). No change .  Impression: Tolerating radiation therapy well. We talked about improving his bladder filling.  Plan: Continue radiation therapy as planned.

## 2011-11-10 NOTE — Progress Notes (Signed)
Patient here weekly rad txs prostate,completed 6/40 , no issues stated patient, takes lasix, makes him go frequently 11:11 AM

## 2011-11-11 ENCOUNTER — Ambulatory Visit: Payer: Managed Care, Other (non HMO)

## 2011-11-11 ENCOUNTER — Ambulatory Visit
Admission: RE | Admit: 2011-11-11 | Discharge: 2011-11-11 | Disposition: A | Discharge status: Home / Self Care | Payer: Managed Care, Other (non HMO) | Source: Ambulatory Visit | Attending: Radiation Oncology | Admitting: Radiation Oncology

## 2011-11-11 DIAGNOSIS — Z510 Encounter for antineoplastic radiation therapy: Secondary | ICD-10-CM | POA: Diagnosis not present

## 2011-11-12 ENCOUNTER — Ambulatory Visit
Admission: RE | Admit: 2011-11-12 | Discharge: 2011-11-12 | Disposition: A | Discharge status: Home / Self Care | Payer: Managed Care, Other (non HMO) | Source: Ambulatory Visit | Attending: Radiation Oncology | Admitting: Radiation Oncology

## 2011-11-12 ENCOUNTER — Ambulatory Visit: Payer: Managed Care, Other (non HMO)

## 2011-11-12 DIAGNOSIS — Z510 Encounter for antineoplastic radiation therapy: Secondary | ICD-10-CM | POA: Diagnosis not present

## 2011-11-13 ENCOUNTER — Ambulatory Visit
Admission: RE | Admit: 2011-11-13 | Discharge: 2011-11-13 | Disposition: A | Discharge status: Home / Self Care | Payer: Managed Care, Other (non HMO) | Source: Ambulatory Visit | Attending: Radiation Oncology | Admitting: Radiation Oncology

## 2011-11-13 ENCOUNTER — Ambulatory Visit: Payer: Managed Care, Other (non HMO)

## 2011-11-13 DIAGNOSIS — Z510 Encounter for antineoplastic radiation therapy: Secondary | ICD-10-CM | POA: Diagnosis not present

## 2011-11-14 ENCOUNTER — Ambulatory Visit
Admission: RE | Admit: 2011-11-14 | Discharge: 2011-11-14 | Disposition: A | Discharge status: Home / Self Care | Payer: Managed Care, Other (non HMO) | Source: Ambulatory Visit | Attending: Radiation Oncology | Admitting: Radiation Oncology

## 2011-11-14 ENCOUNTER — Ambulatory Visit: Payer: Managed Care, Other (non HMO)

## 2011-11-14 DIAGNOSIS — Z510 Encounter for antineoplastic radiation therapy: Secondary | ICD-10-CM | POA: Diagnosis not present

## 2011-11-17 ENCOUNTER — Ambulatory Visit
Admission: RE | Admit: 2011-11-17 | Discharge: 2011-11-17 | Disposition: A | Discharge status: Home / Self Care | Payer: Managed Care, Other (non HMO) | Source: Ambulatory Visit | Attending: Radiation Oncology | Admitting: Radiation Oncology

## 2011-11-17 ENCOUNTER — Encounter: Payer: Self-pay | Admitting: Radiation Oncology

## 2011-11-17 ENCOUNTER — Ambulatory Visit: Payer: Managed Care, Other (non HMO)

## 2011-11-17 VITALS — BP 147/82 | HR 73 | Temp 97.8°F | Resp 20 | Wt 299.2 lb

## 2011-11-17 DIAGNOSIS — C61 Malignant neoplasm of prostate: Secondary | ICD-10-CM

## 2011-11-17 DIAGNOSIS — Z510 Encounter for antineoplastic radiation therapy: Secondary | ICD-10-CM | POA: Diagnosis not present

## 2011-11-17 NOTE — Progress Notes (Signed)
Weekly Management Note:  Site: Prostate Current Dose:  2145  cGy Projected Dose: 7800  cGy  Narrative: The patient is seen today for routine under treatment assessment. CBCT/MVCT images/port films were reviewed. The chart was reviewed.   Bladder filling is satisfactory. Some increasing urinary frequency with some loosening of his bowels over the weekend. He took 1 dose of Imodium.  Physical Examination:  Filed Vitals:   11/17/11 1119  BP: 147/82  Pulse: 73  Temp: 97.8 F (36.6 C)  Resp: 20  .  Weight: 299 lb 3.2 oz (135.716 kg). No change.  Impression: Tolerating radiation therapy well.  Plan: Continue radiation therapy as planned.

## 2011-11-17 NOTE — Progress Notes (Signed)
Patient here weekly rad txs prostate:11/40completed , increased frequency voiding , loose stools so took imodium , no c/o pain, eating well,drinking mostly water stated patient 11:21 AM

## 2011-11-18 ENCOUNTER — Ambulatory Visit
Admission: RE | Admit: 2011-11-18 | Discharge: 2011-11-18 | Disposition: A | Discharge status: Home / Self Care | Payer: Managed Care, Other (non HMO) | Source: Ambulatory Visit | Attending: Radiation Oncology | Admitting: Radiation Oncology

## 2011-11-18 ENCOUNTER — Ambulatory Visit: Payer: Managed Care, Other (non HMO)

## 2011-11-18 DIAGNOSIS — Z510 Encounter for antineoplastic radiation therapy: Secondary | ICD-10-CM | POA: Diagnosis not present

## 2011-11-19 ENCOUNTER — Ambulatory Visit
Admission: RE | Admit: 2011-11-19 | Discharge: 2011-11-19 | Disposition: A | Discharge status: Home / Self Care | Payer: Managed Care, Other (non HMO) | Source: Ambulatory Visit | Attending: Radiation Oncology | Admitting: Radiation Oncology

## 2011-11-19 ENCOUNTER — Ambulatory Visit: Payer: Managed Care, Other (non HMO)

## 2011-11-19 DIAGNOSIS — Z510 Encounter for antineoplastic radiation therapy: Secondary | ICD-10-CM | POA: Diagnosis not present

## 2011-11-20 ENCOUNTER — Ambulatory Visit
Admission: RE | Admit: 2011-11-20 | Discharge: 2011-11-20 | Disposition: A | Discharge status: Home / Self Care | Payer: Managed Care, Other (non HMO) | Source: Ambulatory Visit | Attending: Radiation Oncology | Admitting: Radiation Oncology

## 2011-11-20 ENCOUNTER — Other Ambulatory Visit: Payer: Self-pay | Admitting: Radiation Oncology

## 2011-11-20 ENCOUNTER — Encounter: Payer: Self-pay | Admitting: Radiation Oncology

## 2011-11-20 ENCOUNTER — Ambulatory Visit: Payer: Managed Care, Other (non HMO)

## 2011-11-20 VITALS — BP 149/83 | HR 83 | Temp 98.3°F | Resp 20

## 2011-11-20 DIAGNOSIS — Z510 Encounter for antineoplastic radiation therapy: Secondary | ICD-10-CM | POA: Diagnosis not present

## 2011-11-20 DIAGNOSIS — C61 Malignant neoplasm of prostate: Secondary | ICD-10-CM

## 2011-11-20 NOTE — Progress Notes (Signed)
patient had called and left voice message he was voiding every 1/2 hour, to see MD before treatment today, alert,oriented x3, no dysuria, "I just have to go void every 1/2 hour ", "had 2-3 good bowel movements this morning, no other c/o, "I've been drinking 2 of the 16 ounce bottles of water 1 hour before I come for treatments,  10:02 AM  10:01 AM

## 2011-11-20 NOTE — Progress Notes (Signed)
The patient is seen today prior to his radiation therapy treatment complaining of urinary frequency with a week urinary stream. He's had excellent bladder filling during his course of therapy. I suspect he has some degree of urinary outlet obstruction. I am prescribing tamsulosin 0.4 mg to take 1 daily, dispense #30 with 6 refills.

## 2011-11-21 ENCOUNTER — Telehealth: Payer: Self-pay | Admitting: *Deleted

## 2011-11-21 ENCOUNTER — Ambulatory Visit
Admission: RE | Admit: 2011-11-21 | Discharge: 2011-11-21 | Disposition: A | Discharge status: Home / Self Care | Payer: Managed Care, Other (non HMO) | Source: Ambulatory Visit | Attending: Radiation Oncology | Admitting: Radiation Oncology

## 2011-11-21 ENCOUNTER — Ambulatory Visit: Payer: Managed Care, Other (non HMO)

## 2011-11-21 DIAGNOSIS — Z510 Encounter for antineoplastic radiation therapy: Secondary | ICD-10-CM | POA: Diagnosis not present

## 2011-11-21 NOTE — Telephone Encounter (Signed)
error 

## 2011-11-24 ENCOUNTER — Ambulatory Visit
Admission: RE | Admit: 2011-11-24 | Discharge: 2011-11-24 | Disposition: A | Discharge status: Home / Self Care | Payer: Managed Care, Other (non HMO) | Source: Ambulatory Visit | Attending: Radiation Oncology | Admitting: Radiation Oncology

## 2011-11-24 ENCOUNTER — Ambulatory Visit: Payer: Managed Care, Other (non HMO)

## 2011-11-24 ENCOUNTER — Encounter: Payer: Self-pay | Admitting: Radiation Oncology

## 2011-11-24 VITALS — BP 154/82 | HR 89 | Temp 97.8°F | Resp 20 | Wt 293.6 lb

## 2011-11-24 DIAGNOSIS — C61 Malignant neoplasm of prostate: Secondary | ICD-10-CM

## 2011-11-24 DIAGNOSIS — Z510 Encounter for antineoplastic radiation therapy: Secondary | ICD-10-CM | POA: Diagnosis not present

## 2011-11-24 NOTE — Progress Notes (Signed)
Patient here weekly rad txs prostate:16/40 txs completed ,patient alert,oriented x3,patient states "Flomax hes helped a lot, gets up only 2x night to void":, not going every 1/2 hour anymore", holdong off on his fluid pill till after rad txs, took flomax pill this am already , sometimes gets constipated and then goes back on miralax 10:58 AM

## 2011-11-24 NOTE — Progress Notes (Signed)
Weekly Management Note:  Site: prostate Current Dose:  3120  cGy Projected Dose: 7800  cGy  Narrative: The patient is seen today for routine under treatment assessment. CBCT/MVCT images/port films were reviewed. The chart was reviewed.   Bladder filling is satisfactory. His urinary frequency and stream are improved with Flomax.  Physical Examination:  Filed Vitals:   11/24/11 1058  BP: 154/82  Pulse: 89  Temp: 97.8 F (36.6 C)  Resp: 20  .  Weight: 293 lb 9.6 oz (133.176 kg). No change.  Impression: Tolerating radiation therapy well.  Plan: Continue radiation therapy as planned.

## 2011-11-25 ENCOUNTER — Ambulatory Visit: Payer: Managed Care, Other (non HMO)

## 2011-11-25 ENCOUNTER — Ambulatory Visit
Admission: RE | Admit: 2011-11-25 | Discharge: 2011-11-25 | Disposition: A | Discharge status: Home / Self Care | Payer: Managed Care, Other (non HMO) | Source: Ambulatory Visit | Attending: Radiation Oncology | Admitting: Radiation Oncology

## 2011-11-25 DIAGNOSIS — Z510 Encounter for antineoplastic radiation therapy: Secondary | ICD-10-CM | POA: Diagnosis not present

## 2011-11-26 ENCOUNTER — Ambulatory Visit: Payer: Managed Care, Other (non HMO)

## 2011-11-26 ENCOUNTER — Ambulatory Visit
Admission: RE | Admit: 2011-11-26 | Discharge: 2011-11-26 | Disposition: A | Discharge status: Home / Self Care | Payer: Managed Care, Other (non HMO) | Source: Ambulatory Visit | Attending: Radiation Oncology | Admitting: Radiation Oncology

## 2011-11-26 DIAGNOSIS — Z510 Encounter for antineoplastic radiation therapy: Secondary | ICD-10-CM | POA: Diagnosis not present

## 2011-11-27 ENCOUNTER — Ambulatory Visit: Payer: Managed Care, Other (non HMO)

## 2011-11-27 ENCOUNTER — Ambulatory Visit
Admission: RE | Admit: 2011-11-27 | Discharge: 2011-11-27 | Disposition: A | Discharge status: Home / Self Care | Payer: Managed Care, Other (non HMO) | Source: Ambulatory Visit | Attending: Radiation Oncology | Admitting: Radiation Oncology

## 2011-11-27 DIAGNOSIS — Z510 Encounter for antineoplastic radiation therapy: Secondary | ICD-10-CM | POA: Diagnosis not present

## 2011-11-28 ENCOUNTER — Ambulatory Visit
Admission: RE | Admit: 2011-11-28 | Discharge: 2011-11-28 | Disposition: A | Discharge status: Home / Self Care | Payer: Managed Care, Other (non HMO) | Source: Ambulatory Visit | Attending: Radiation Oncology | Admitting: Radiation Oncology

## 2011-11-28 ENCOUNTER — Ambulatory Visit: Payer: Managed Care, Other (non HMO)

## 2011-11-28 DIAGNOSIS — Z510 Encounter for antineoplastic radiation therapy: Secondary | ICD-10-CM | POA: Diagnosis not present

## 2011-12-01 ENCOUNTER — Ambulatory Visit: Payer: Managed Care, Other (non HMO)

## 2011-12-01 ENCOUNTER — Encounter: Payer: Self-pay | Admitting: Radiation Oncology

## 2011-12-01 ENCOUNTER — Ambulatory Visit
Admission: RE | Admit: 2011-12-01 | Discharge: 2011-12-01 | Disposition: A | Discharge status: Home / Self Care | Payer: Managed Care, Other (non HMO) | Source: Ambulatory Visit | Attending: Radiation Oncology | Admitting: Radiation Oncology

## 2011-12-01 VITALS — BP 132/72 | HR 72 | Temp 97.7°F | Resp 20 | Wt 292.2 lb

## 2011-12-01 DIAGNOSIS — Z510 Encounter for antineoplastic radiation therapy: Secondary | ICD-10-CM | POA: Diagnosis not present

## 2011-12-01 DIAGNOSIS — C61 Malignant neoplasm of prostate: Secondary | ICD-10-CM

## 2011-12-01 NOTE — Progress Notes (Signed)
Weekly Management Note:  Site: Prostate Current Dose:  4095  cGy Projected Dose: 7800  cGy  Narrative: The patient is seen today for routine under treatment assessment. CBCT/MVCT images/port films were reviewed. The chart was reviewed.   Bladder filling is satisfactory today. He continues with his Flomax with a good result. No GI difficulties although he takes MiraLax when necessary constipation.  Physical Examination:  Filed Vitals:   12/01/11 1140  BP: 132/72  Pulse: 72  Temp: 97.7 F (36.5 C)  Resp: 20  .  Weight: 292 lb 3.2 oz (132.541 kg). No change.  Impression: Tolerating radiation therapy well.  Plan: Continue radiation therapy as planned.

## 2011-12-01 NOTE — Progress Notes (Signed)
Patient here  Weekly rad tx prostate:21/40 completed, no c/o pain, has some  frequency still, on flomax, no dysuria, 2x up at night, bowels occasionally constipated,takes miralax 11:39 AM

## 2011-12-02 ENCOUNTER — Ambulatory Visit
Admission: RE | Admit: 2011-12-02 | Discharge: 2011-12-02 | Disposition: A | Discharge status: Home / Self Care | Payer: Managed Care, Other (non HMO) | Source: Ambulatory Visit | Attending: Radiation Oncology | Admitting: Radiation Oncology

## 2011-12-02 ENCOUNTER — Ambulatory Visit: Payer: Managed Care, Other (non HMO)

## 2011-12-02 DIAGNOSIS — Z510 Encounter for antineoplastic radiation therapy: Secondary | ICD-10-CM | POA: Diagnosis not present

## 2011-12-03 ENCOUNTER — Ambulatory Visit
Admission: RE | Admit: 2011-12-03 | Discharge: 2011-12-03 | Disposition: A | Discharge status: Home / Self Care | Payer: Managed Care, Other (non HMO) | Source: Ambulatory Visit | Attending: Radiation Oncology | Admitting: Radiation Oncology

## 2011-12-03 ENCOUNTER — Ambulatory Visit: Payer: Managed Care, Other (non HMO)

## 2011-12-03 DIAGNOSIS — Z510 Encounter for antineoplastic radiation therapy: Secondary | ICD-10-CM | POA: Diagnosis not present

## 2011-12-04 ENCOUNTER — Ambulatory Visit
Admission: RE | Admit: 2011-12-04 | Discharge: 2011-12-04 | Disposition: A | Discharge status: Home / Self Care | Payer: Managed Care, Other (non HMO) | Source: Ambulatory Visit | Attending: Radiation Oncology | Admitting: Radiation Oncology

## 2011-12-04 ENCOUNTER — Ambulatory Visit: Payer: Managed Care, Other (non HMO)

## 2011-12-04 DIAGNOSIS — Z510 Encounter for antineoplastic radiation therapy: Secondary | ICD-10-CM | POA: Diagnosis not present

## 2011-12-05 ENCOUNTER — Ambulatory Visit
Admission: RE | Admit: 2011-12-05 | Discharge: 2011-12-05 | Disposition: A | Discharge status: Home / Self Care | Payer: Managed Care, Other (non HMO) | Source: Ambulatory Visit | Attending: Radiation Oncology | Admitting: Radiation Oncology

## 2011-12-05 ENCOUNTER — Ambulatory Visit: Payer: Managed Care, Other (non HMO)

## 2011-12-05 DIAGNOSIS — Z510 Encounter for antineoplastic radiation therapy: Secondary | ICD-10-CM | POA: Diagnosis not present

## 2011-12-06 ENCOUNTER — Ambulatory Visit
Admission: RE | Admit: 2011-12-06 | Discharge: 2011-12-06 | Disposition: A | Discharge status: Home / Self Care | Payer: Managed Care, Other (non HMO) | Source: Ambulatory Visit | Attending: Radiation Oncology | Admitting: Radiation Oncology

## 2011-12-06 DIAGNOSIS — Z510 Encounter for antineoplastic radiation therapy: Secondary | ICD-10-CM | POA: Diagnosis not present

## 2011-12-08 ENCOUNTER — Encounter: Payer: Self-pay | Admitting: Radiation Oncology

## 2011-12-08 ENCOUNTER — Ambulatory Visit: Payer: Managed Care, Other (non HMO)

## 2011-12-08 ENCOUNTER — Ambulatory Visit
Admission: RE | Admit: 2011-12-08 | Discharge: 2011-12-08 | Disposition: A | Discharge status: Home / Self Care | Payer: Managed Care, Other (non HMO) | Source: Ambulatory Visit | Attending: Radiation Oncology | Admitting: Radiation Oncology

## 2011-12-08 VITALS — BP 149/77 | HR 73 | Temp 98.1°F | Resp 20 | Wt 292.2 lb

## 2011-12-08 DIAGNOSIS — C61 Malignant neoplasm of prostate: Secondary | ICD-10-CM

## 2011-12-08 DIAGNOSIS — Z510 Encounter for antineoplastic radiation therapy: Secondary | ICD-10-CM | POA: Diagnosis not present

## 2011-12-08 NOTE — Progress Notes (Signed)
Weekly Management Note:  Site: Prostate Current Dose:  5265  cGy Projected Dose: 7800  cGy  Narrative: The patient is seen today for routine under treatment assessment. CBCT/MVCT images/port films were reviewed. The chart was reviewed.  He continues with his Flomax. Satisfactory bladder filling today. No significant change in his GU or GI habits.  Physical Examination:  Filed Vitals:   12/08/11 1121  BP: 149/77  Pulse: 73  Temp: 98.1 F (36.7 C)  Resp: 20  .  Weight: 292 lb 3.2 oz (132.541 kg). No change.  Impression: Tolerating radiation therapy well.  Plan: Continue radiation therapy as planned.

## 2011-12-08 NOTE — Progress Notes (Signed)
Patient  Here weekly rad txs prostate: 27/40 completed, takes his lasix 6-7pm, , bowel movements good ,takes miralax when constipated, takes flomax in mornings, no dysuria, gets up 2x night to void, no pain 11:20 AM

## 2011-12-09 ENCOUNTER — Ambulatory Visit: Payer: Managed Care, Other (non HMO)

## 2011-12-09 ENCOUNTER — Ambulatory Visit
Admission: RE | Admit: 2011-12-09 | Discharge: 2011-12-09 | Disposition: A | Discharge status: Home / Self Care | Payer: Managed Care, Other (non HMO) | Source: Ambulatory Visit | Attending: Radiation Oncology | Admitting: Radiation Oncology

## 2011-12-09 DIAGNOSIS — Z510 Encounter for antineoplastic radiation therapy: Secondary | ICD-10-CM | POA: Diagnosis not present

## 2011-12-10 ENCOUNTER — Ambulatory Visit
Admission: RE | Admit: 2011-12-10 | Discharge: 2011-12-10 | Disposition: A | Discharge status: Home / Self Care | Payer: Managed Care, Other (non HMO) | Source: Ambulatory Visit | Attending: Radiation Oncology | Admitting: Radiation Oncology

## 2011-12-10 ENCOUNTER — Ambulatory Visit: Payer: Managed Care, Other (non HMO)

## 2011-12-10 DIAGNOSIS — Z510 Encounter for antineoplastic radiation therapy: Secondary | ICD-10-CM | POA: Diagnosis not present

## 2011-12-12 ENCOUNTER — Ambulatory Visit: Payer: Managed Care, Other (non HMO)

## 2011-12-15 ENCOUNTER — Ambulatory Visit
Admission: RE | Admit: 2011-12-15 | Discharge: 2011-12-15 | Disposition: A | Discharge status: Home / Self Care | Payer: Managed Care, Other (non HMO) | Source: Ambulatory Visit | Attending: Radiation Oncology | Admitting: Radiation Oncology

## 2011-12-15 ENCOUNTER — Encounter: Payer: Self-pay | Admitting: Radiation Oncology

## 2011-12-15 ENCOUNTER — Ambulatory Visit: Payer: Managed Care, Other (non HMO)

## 2011-12-15 VITALS — BP 134/69 | HR 70 | Temp 97.8°F | Resp 20 | Wt 299.5 lb

## 2011-12-15 DIAGNOSIS — Z510 Encounter for antineoplastic radiation therapy: Secondary | ICD-10-CM | POA: Diagnosis not present

## 2011-12-15 DIAGNOSIS — C61 Malignant neoplasm of prostate: Secondary | ICD-10-CM

## 2011-12-15 NOTE — Progress Notes (Signed)
Weekly Management Note:  Site: Prostate Current Dose:  5850  cGy Projected Dose: 7800  cGy  Narrative: The patient is seen today for routine under treatment assessment. CBCT/MVCT images/port films were reviewed. The chart was reviewed.   Satisfactory bladder filling today. No new GU or GI difficulties. He does have nocturia x1-2.  Physical Examination:  Filed Vitals:   12/15/11 1122  BP: 134/69  Pulse: 70  Temp: 97.8 F (36.6 C)  Resp: 20  .  Weight: 299 lb 8 oz (135.852 kg). No change.  Impression: Tolerating radiation therapy well.  Plan: Continue radiation therapy as planned.

## 2011-12-15 NOTE — Progress Notes (Signed)
patient here weekly rad txs  Prostate:30/40completed, no dysuria, regular bowel movements, nocturia 1-2x On lasix, can't tell on urgency/frequency,takes this after rad txs 11:22 AM

## 2011-12-16 ENCOUNTER — Ambulatory Visit: Payer: Managed Care, Other (non HMO)

## 2011-12-16 ENCOUNTER — Ambulatory Visit
Admission: RE | Admit: 2011-12-16 | Discharge: 2011-12-16 | Disposition: A | Discharge status: Home / Self Care | Payer: Managed Care, Other (non HMO) | Source: Ambulatory Visit | Attending: Radiation Oncology | Admitting: Radiation Oncology

## 2011-12-16 DIAGNOSIS — Z510 Encounter for antineoplastic radiation therapy: Secondary | ICD-10-CM | POA: Diagnosis not present

## 2011-12-17 ENCOUNTER — Ambulatory Visit: Payer: Managed Care, Other (non HMO)

## 2011-12-17 ENCOUNTER — Ambulatory Visit
Admission: RE | Admit: 2011-12-17 | Discharge: 2011-12-17 | Disposition: A | Discharge status: Home / Self Care | Payer: Managed Care, Other (non HMO) | Source: Ambulatory Visit | Attending: Radiation Oncology | Admitting: Radiation Oncology

## 2011-12-17 DIAGNOSIS — Z510 Encounter for antineoplastic radiation therapy: Secondary | ICD-10-CM | POA: Diagnosis not present

## 2011-12-18 ENCOUNTER — Ambulatory Visit: Payer: Managed Care, Other (non HMO)

## 2011-12-18 ENCOUNTER — Ambulatory Visit
Admission: RE | Admit: 2011-12-18 | Discharge: 2011-12-18 | Disposition: A | Discharge status: Home / Self Care | Payer: Managed Care, Other (non HMO) | Source: Ambulatory Visit | Attending: Radiation Oncology | Admitting: Radiation Oncology

## 2011-12-18 DIAGNOSIS — Z510 Encounter for antineoplastic radiation therapy: Secondary | ICD-10-CM | POA: Diagnosis not present

## 2011-12-19 ENCOUNTER — Ambulatory Visit
Admission: RE | Admit: 2011-12-19 | Discharge: 2011-12-19 | Disposition: A | Discharge status: Home / Self Care | Payer: Managed Care, Other (non HMO) | Source: Ambulatory Visit | Attending: Radiation Oncology | Admitting: Radiation Oncology

## 2011-12-19 ENCOUNTER — Ambulatory Visit: Payer: Managed Care, Other (non HMO)

## 2011-12-19 DIAGNOSIS — Z510 Encounter for antineoplastic radiation therapy: Secondary | ICD-10-CM | POA: Diagnosis not present

## 2011-12-22 ENCOUNTER — Ambulatory Visit: Payer: Managed Care, Other (non HMO)

## 2011-12-22 ENCOUNTER — Ambulatory Visit
Admission: RE | Admit: 2011-12-22 | Discharge: 2011-12-22 | Disposition: A | Discharge status: Home / Self Care | Payer: Managed Care, Other (non HMO) | Source: Ambulatory Visit | Attending: Radiation Oncology | Admitting: Radiation Oncology

## 2011-12-22 ENCOUNTER — Encounter: Payer: Self-pay | Admitting: Radiation Oncology

## 2011-12-22 VITALS — BP 152/79 | HR 70 | Resp 18 | Wt 294.2 lb

## 2011-12-22 DIAGNOSIS — Z510 Encounter for antineoplastic radiation therapy: Secondary | ICD-10-CM | POA: Diagnosis not present

## 2011-12-22 DIAGNOSIS — C61 Malignant neoplasm of prostate: Secondary | ICD-10-CM

## 2011-12-22 NOTE — Progress Notes (Signed)
Weekly Management Note:  Site: Plus Current Dose:  6825  cGy Projected Dose: 7800  cGy  Narrative: The patient is seen today for routine under treatment assessment. CBCT/MVCT images/port films were reviewed. The chart was reviewed.   Filling is satisfactory. He does report fatigue and more urinary frequency along with slowing of the stream.  Physical Examination:  Filed Vitals:   12/22/11 1127  BP: 152/79  Pulse: 70  Resp: 18  .  Weight: 294 lb 3.2 oz (133.448 kg). No change.  Impression: Tolerating radiation therapy well.  Plan: Continue radiation therapy as planned.

## 2011-12-22 NOTE — Progress Notes (Signed)
Patient presents to the clinic today unaccompanied for PUT with Dr. Valere Dross. Patient alert and oriented to person, place, and time. No distress noted. Steady gait noted. Flat affect noted. Patient denies pain at this time. Patient reports fatigue is worse. Patient reports great fatigue the end of last week. Patient curious if Dr. Valere Dross would write him out of work Thursday and Friday of this week to allow him time to rest. Patient denies hematuria. Patient reports on average he gets up twice per night to void. Patient reports this is more frequent than prior to beginning treatment. Patient reports prior to treatment he only would get up once if at all. Patient reports his urine stream alternates between strong and weak. Patient denies burning or pain with urination or defecation. Patient denies diarrhea but, does report soft stools. Reported all findings to Dr. Valere Dross.

## 2011-12-23 ENCOUNTER — Ambulatory Visit: Payer: Managed Care, Other (non HMO)

## 2011-12-23 ENCOUNTER — Ambulatory Visit
Admission: RE | Admit: 2011-12-23 | Discharge: 2011-12-23 | Disposition: A | Discharge status: Home / Self Care | Payer: Managed Care, Other (non HMO) | Source: Ambulatory Visit | Attending: Radiation Oncology | Admitting: Radiation Oncology

## 2011-12-23 DIAGNOSIS — Z510 Encounter for antineoplastic radiation therapy: Secondary | ICD-10-CM | POA: Diagnosis not present

## 2011-12-24 ENCOUNTER — Ambulatory Visit
Admission: RE | Admit: 2011-12-24 | Discharge: 2011-12-24 | Disposition: A | Discharge status: Home / Self Care | Payer: Managed Care, Other (non HMO) | Source: Ambulatory Visit | Attending: Radiation Oncology | Admitting: Radiation Oncology

## 2011-12-24 ENCOUNTER — Ambulatory Visit: Payer: Managed Care, Other (non HMO)

## 2011-12-24 DIAGNOSIS — Z510 Encounter for antineoplastic radiation therapy: Secondary | ICD-10-CM | POA: Insufficient documentation

## 2011-12-24 DIAGNOSIS — C61 Malignant neoplasm of prostate: Secondary | ICD-10-CM | POA: Insufficient documentation

## 2011-12-24 DIAGNOSIS — R350 Frequency of micturition: Secondary | ICD-10-CM | POA: Insufficient documentation

## 2011-12-24 DIAGNOSIS — R39198 Other difficulties with micturition: Secondary | ICD-10-CM | POA: Insufficient documentation

## 2011-12-25 ENCOUNTER — Ambulatory Visit
Admission: RE | Admit: 2011-12-25 | Discharge: 2011-12-25 | Disposition: A | Discharge status: Home / Self Care | Payer: Managed Care, Other (non HMO) | Source: Ambulatory Visit | Attending: Radiation Oncology | Admitting: Radiation Oncology

## 2011-12-25 ENCOUNTER — Ambulatory Visit: Payer: Managed Care, Other (non HMO)

## 2011-12-26 ENCOUNTER — Ambulatory Visit
Admission: RE | Admit: 2011-12-26 | Discharge: 2011-12-26 | Disposition: A | Discharge status: Home / Self Care | Payer: Managed Care, Other (non HMO) | Source: Ambulatory Visit | Attending: Radiation Oncology | Admitting: Radiation Oncology

## 2011-12-26 ENCOUNTER — Ambulatory Visit: Payer: Managed Care, Other (non HMO)

## 2011-12-29 ENCOUNTER — Ambulatory Visit
Admission: RE | Admit: 2011-12-29 | Discharge: 2011-12-29 | Disposition: A | Discharge status: Home / Self Care | Payer: Managed Care, Other (non HMO) | Source: Ambulatory Visit | Attending: Radiation Oncology | Admitting: Radiation Oncology

## 2011-12-29 ENCOUNTER — Ambulatory Visit: Payer: Managed Care, Other (non HMO)

## 2011-12-29 ENCOUNTER — Encounter: Payer: Self-pay | Admitting: Radiation Oncology

## 2011-12-29 VITALS — BP 152/90 | HR 74 | Temp 98.4°F | Resp 20 | Wt 291.9 lb

## 2011-12-29 DIAGNOSIS — C61 Malignant neoplasm of prostate: Secondary | ICD-10-CM

## 2011-12-29 HISTORY — PX: INSERTION PROSTATE RADIATION SEED: SUR718

## 2011-12-29 NOTE — Progress Notes (Signed)
Weekly Management Note:  Site: Prostate  Current Dose:  7800  cGy Projected Dose: 7800  cGy  Narrative: The patient is seen today for routine under treatment assessment. CBCT/MVCT images/port films were reviewed. The chart was reviewed.   Bladder filling is excellent today. No new GU or GI difficulties. Radiation therapy is completed today.  Physical Examination:  Filed Vitals:   12/29/11 1128  BP: 152/90  Pulse: 74  Temp: 98.4 F (36.9 C)  Resp: 20  .  Weight: 291 lb 14.4 oz (132.405 kg). No change  Impression: Radiation therapy well tolerated.  Plan: Treatment completed, followup visit in one month.

## 2011-12-29 NOTE — Progress Notes (Signed)
On 11/03/2011 the patient underwent his first IMRT treatment in the management of his carcinoma the prostate. He was treated with 2 modulated arcs corresponding to one set of IMRT treatment devices, [id number].

## 2011-12-29 NOTE — Progress Notes (Signed)
[facility] End of Treatment Note  Name:Jacob [name]  Date: 12/29/2011 [id number] DOB:[birth year]   Status:outpatient    CC: Tivis Ringer, MD  Dr. Lowella Bandy  REFERRING PHYSICIAN:  Dr. Lowella Bandy    DIAGNOSIS:  Stage TI C. intermediate risk adenocarcinoma prostate  INDICATION FOR TREATMENT: Curative   TREATMENT DATES: 11/03/2011 through 12/29/2011                          SITE/DOSE:  Prostate 7800 cGy 40 sessions, seminal vesicles 5600 cGy 40 sessions                          BEAMS/ENERGY: 6 MV photons dual ARC VMAT IMRT   with daily image guidance.               NARRATIVE:  [name] tolerated his treatment reasonably well  although he developed slowing of his urinary stream with urinary frequency which improved with Flomax. No significant rectal toxicity.                       PLAN: Routine followup in one month. Patient instructed to call if questions or worsening complaints in interim.

## 2011-12-29 NOTE — Progress Notes (Signed)
Patient here weekly  Prostate rad txs, completed 40/40, no dysuria, took imodium ad for diarrhea which resolved, has some hesitancy, urgency  And frequency after taking his lasix no c/o pain,1 month f/u appt card given 11:30 AM

## 2011-12-30 ENCOUNTER — Encounter: Payer: Self-pay | Admitting: Gastroenterology

## 2011-12-30 ENCOUNTER — Ambulatory Visit: Payer: Managed Care, Other (non HMO)

## 2012-01-23 ENCOUNTER — Telehealth: Payer: Self-pay | Admitting: *Deleted

## 2012-01-23 NOTE — Telephone Encounter (Signed)
Pt had 3 seeds placed in his prostate in 10-13.  He finished all treatment 12-28-12.  Per Dr. Lynne Leader phone note on 09-30-11, pt is ok to have his procedure 3 months after seeds were placed.  His procedure in 02-09-12

## 2012-01-26 ENCOUNTER — Encounter: Payer: Self-pay | Admitting: Radiation Oncology

## 2012-01-26 ENCOUNTER — Telehealth: Payer: Self-pay | Admitting: *Deleted

## 2012-01-26 ENCOUNTER — Ambulatory Visit (AMBULATORY_SURGERY_CENTER): Payer: Managed Care, Other (non HMO) | Admitting: *Deleted

## 2012-01-26 ENCOUNTER — Encounter: Payer: Self-pay | Admitting: Gastroenterology

## 2012-01-26 VITALS — Ht 74.0 in | Wt 295.0 lb

## 2012-01-26 DIAGNOSIS — K2970 Gastritis, unspecified, without bleeding: Secondary | ICD-10-CM

## 2012-01-26 DIAGNOSIS — Z860100 Personal history of colon polyps, unspecified: Secondary | ICD-10-CM

## 2012-01-26 DIAGNOSIS — K319 Disease of stomach and duodenum, unspecified: Secondary | ICD-10-CM

## 2012-01-26 DIAGNOSIS — K2990 Gastroduodenitis, unspecified, without bleeding: Secondary | ICD-10-CM

## 2012-01-26 DIAGNOSIS — K31A0 Gastric intestinal metaplasia, unspecified: Secondary | ICD-10-CM

## 2012-01-26 DIAGNOSIS — K3189 Other diseases of stomach and duodenum: Secondary | ICD-10-CM

## 2012-01-26 MED ORDER — MOVIPREP 100 G PO SOLR: ORAL | Status: DC

## 2012-01-26 NOTE — Telephone Encounter (Signed)
Patient is for Recall Colon & EGD on 02-09-12. Prostate seeds placed 10-2011, and finished all radiation treatments on 12-29-11. He is for follow up office visit with Dr.Murray tomorrow, I asked him to make sure he is cleared for this per Dr.Stark note on 09-30-2011. Patient or office will call us before scheduled exam.

## 2012-01-26 NOTE — Progress Notes (Signed)
See phone note. Patient will call us back after he see's Dr.Murray tomorrow with clearance to have procedures.

## 2012-01-27 ENCOUNTER — Encounter: Payer: Self-pay | Admitting: Radiation Oncology

## 2012-01-27 ENCOUNTER — Ambulatory Visit
Admission: RE | Admit: 2012-01-27 | Discharge: 2012-01-27 | Disposition: A | Discharge status: Home / Self Care | Payer: Managed Care, Other (non HMO) | Source: Ambulatory Visit | Attending: Radiation Oncology | Admitting: Radiation Oncology

## 2012-01-27 VITALS — BP 136/72 | HR 73 | Temp 98.7°F | Ht 74.0 in | Wt 293.8 lb

## 2012-01-27 DIAGNOSIS — C61 Malignant neoplasm of prostate: Secondary | ICD-10-CM

## 2012-01-27 HISTORY — DX: Personal history of irradiation: Z92.3

## 2012-01-27 NOTE — Progress Notes (Signed)
CC: Dr. Lowella Bandy  Followup note: [name] returns today, approximately 1 month following completion of external beam/IMRT in the management of his stage TI C. intermediate risk adenocarcinoma prostate. He is without GU or GI difficulties. He still takes Flomax. He expects to have colonoscopy later this month, and also a followup visit with Dr. Janice Norrie.  Physical examination: Alert and oriented. Wt Readings from Last 3 Encounters:  01/27/12 293 lb 12.8 oz (133.267 kg)  01/26/12 295 lb (133.811 kg)  12/29/11 291 lb 14.4 oz (132.405 kg)   Temp Readings from Last 3 Encounters:  01/27/12 98.7 F (37.1 C)   12/29/11 98.4 F (36.9 C) Oral  12/15/11 97.8 F (36.6 C) Oral   BP Readings from Last 3 Encounters:  01/27/12 136/72  12/29/11 152/90  12/22/11 152/79   Pulse Readings from Last 3 Encounters:  01/27/12 73  12/29/11 74  12/22/11 70   Rectal examination not performed today.  Impression: Satisfactory progress. I told Mr. Belisle that he could try to discontinue his Flomax, and resume Flomax if he develops urinary obstructive symptoms.   Plan: He'll see Dr. Janice Norrie later this month. I asked that Dr. Janice Norrie keep me posted on his progress. Dr. Fuller Plan is aware that he's had radiation therapy/IMRT.

## 2012-01-27 NOTE — Progress Notes (Signed)
FU appt.  Denies any urinary burning.  Nocturia x1.  Denies any hesistancy, incontinence, rectal pain, nor loose/diarrheal stools.  VSS. Wants to know if he can take Movi-prep for his pending colonoscopy on 02/09/12. Has App.t with Dr. Janice Norrie on 02/11/12.  He reports he will scheduled to retire in March 2014.

## 2012-01-28 NOTE — Telephone Encounter (Signed)
Talked with pt.  Saw Dr. Valere Dross in office 1/14.  [place] for colonoscopy as scheduled.

## 2012-02-05 ENCOUNTER — Encounter: Payer: Self-pay | Admitting: *Deleted

## 2012-02-05 NOTE — Progress Notes (Signed)
[place] Psychosocial Distress Screening Clinical Social Work  Clinical Social Work was referred by distress screening protocol.  The patient scored a 6 on the Psychosocial Distress Thermometer which indicates moderate distress. Clinical Social Worker contacted patient to assess for distress and other psychosocial needs. [name] feels that he is doing well even though he continues to experience fatigue.  He does not feel that he has any emotional concerns at this time, but plans to contact CSW at a later time to talk further; patient currently at work.   Polo Riley, MSW, [place] Worker [facility] [phone]

## 2012-02-09 ENCOUNTER — Encounter: Payer: Self-pay | Admitting: Gastroenterology

## 2012-02-09 ENCOUNTER — Ambulatory Visit (AMBULATORY_SURGERY_CENTER): Payer: Managed Care, Other (non HMO) | Admitting: Gastroenterology

## 2012-02-09 VITALS — BP 150/93 | HR 63 | Temp 97.0°F | Resp 18 | Ht 74.0 in | Wt 295.0 lb

## 2012-02-09 DIAGNOSIS — Z860100 Personal history of colon polyps, unspecified: Secondary | ICD-10-CM

## 2012-02-09 DIAGNOSIS — Z1211 Encounter for screening for malignant neoplasm of colon: Secondary | ICD-10-CM

## 2012-02-09 DIAGNOSIS — D126 Benign neoplasm of colon, unspecified: Secondary | ICD-10-CM

## 2012-02-09 DIAGNOSIS — K2990 Gastroduodenitis, unspecified, without bleeding: Secondary | ICD-10-CM

## 2012-02-09 DIAGNOSIS — K2970 Gastritis, unspecified, without bleeding: Secondary | ICD-10-CM

## 2012-02-09 DIAGNOSIS — K2960 Other gastritis without bleeding: Secondary | ICD-10-CM

## 2012-02-09 MED ORDER — SODIUM CHLORIDE 0.9 % IV SOLN: 500.0000 mL | INTRAVENOUS | Status: DC

## 2012-02-09 NOTE — Progress Notes (Signed)
Called to room to assist during endoscopic procedure.  Patient ID and intended procedure confirmed with present staff. Received instructions for my participation in the procedure from the performing physician.  

## 2012-02-09 NOTE — Patient Instructions (Addendum)
YOU HAD AN ENDOSCOPIC PROCEDURE TODAY AT THE [facility]: Refer to the procedure report that was given to you for any specific questions about what was found during the examination.  If the procedure report does not answer your questions, please call your gastroenterologist to clarify.  If you requested that your care partner not be given the details of your procedure findings, then the procedure report has been included in a sealed envelope for you to review at your convenience later.  YOU SHOULD EXPECT: Some feelings of bloating in the abdomen. Passage of more gas than usual.  Walking can help get rid of the air that was put into your GI tract during the procedure and reduce the bloating. If you had a lower endoscopy (such as a colonoscopy or flexible sigmoidoscopy) you may notice spotting of blood in your stool or on the toilet paper. If you underwent a bowel prep for your procedure, then you may not have a normal bowel movement for a few days.  DIET: Your first meal following the procedure should be a light meal and then it is ok to progress to your normal diet.  A half-sandwich or bowl of soup is an example of a good first meal.  Heavy or fried foods are harder to digest and may make you feel nauseous or bloated.  Likewise meals heavy in dairy and vegetables can cause extra gas to form and this can also increase the bloating.  Drink plenty of fluids but you should avoid alcoholic beverages for 24 hours.  ACTIVITY: Your care partner should take you home directly after the procedure.  You should plan to take it easy, moving slowly for the rest of the day.  You can resume normal activity the day after the procedure however you should NOT DRIVE or use heavy machinery for 24 hours (because of the sedation medicines used during the test).    SYMPTOMS TO REPORT IMMEDIATELY: A gastroenterologist can be reached at any hour.  During normal business hours, 8:30 AM to 5:00 PM Monday through Friday,  call [phone].  After hours and on weekends, please call the GI answering service at [phone] who will take a message and have the physician on call contact you.   Following lower endoscopy (colonoscopy or flexible sigmoidoscopy):  Excessive amounts of blood in the stool  Significant tenderness or worsening of abdominal pains  Swelling of the abdomen that is new, acute  Fever of 100F or higher  Following upper endoscopy (EGD)  Vomiting of blood or coffee ground material  New chest pain or pain under the shoulder blades  Painful or persistently difficult swallowing  New shortness of breath  Fever of 100F or higher  Black, tarry-looking stools  FOLLOW UP: If any biopsies were taken you will be contacted by phone or by letter within the next 1-3 weeks.  Call your gastroenterologist if you have not heard about the biopsies in 3 weeks.  Our staff will call the home number listed on your records the next business day following your procedure to check on you and address any questions or concerns that you may have at that time regarding the information given to you following your procedure. This is a courtesy call and so if there is no answer at the home number and we have not heard from you through the emergency physician on call, we will assume that you have returned to your regular daily activities without incident.  SIGNATURES/CONFIDENTIALITY: You and/or your care   partner have signed paperwork which will be entered into your electronic medical record.  These signatures attest to the fact that that the information above on your After Visit Summary has been reviewed and is understood.  Full responsibility of the confidentiality of this discharge information lies with you and/or your care-partner.   Gastritis-handout given  Polyps-handout given  Hemorrhoids-handout given  Wait pathology results  Repeat colonoscopy in 5 years

## 2012-02-09 NOTE — Op Note (Signed)
[place]  [address] [place], [address]   ENDOSCOPY PROCEDURE REPORT  PATIENT: Jacob [name], Jacob [name]  MR#: [id number] BIRTHDATE: 12-05-1945 , 71  yrs. old GENDER: Male ENDOSCOPIST: Ladene Artist, MD, [facility] PROCEDURE DATE:  02/09/2012 PROCEDURE:  EGD w/ biopsy ASA CLASS:     Class II INDICATIONS:  F/U of gastric intestinal metaplasia. MEDICATIONS: There was residual sedation effect present from prior procedure and propofol (Diprivan) 200mg  IV TOPICAL ANESTHETIC: none DESCRIPTION OF PROCEDURE: After the risks benefits and alternatives of the procedure were thoroughly explained, informed consent was obtained.  The [facility] GIF-H180 [id number] endoscope was introduced through the mouth and advanced to the second portion of the duodenum without limitations.  The instrument was slowly withdrawn as the mucosa was fully examined.   STOMACH: Mild gastritis (inflammation) was found in the gastric body. It was granular and erythematous.  The stomach otherwise appeared normal. ESOPHAGUS: The mucosa of the esophagus appeared normal. DUODENUM: The duodenal mucosa showed no abnormalities in the bulb and second portion of the duodenum.  Retroflexed views revealed no abnormalities.    The scope was then withdrawn from the patient and the procedure completed.  COMPLICATIONS: There were no complications.  ENDOSCOPIC IMPRESSION: 1.   Gastritis (inflammation) was found in the gastric body  RECOMMENDATIONS: 1.  Await pathology results   eSigned:  Ladene Artist, MD, [facility] 02/09/2012 10:23 AM   [id number] [name], MD

## 2012-02-09 NOTE — Progress Notes (Signed)
Patient did not experience any of the following events: a burn prior to discharge; a fall within the facility; wrong site/side/patient/procedure/implant event; or a hospital transfer or hospital admission upon discharge from the facility. (G8907) Patient did not have preoperative order for IV antibiotic SSI prophylaxis. (G8918)  

## 2012-02-09 NOTE — Progress Notes (Signed)
VSS, Pt sleepy with Spont resp. O2 via n/c at 3 l/min. Pleased with MAC, Report to April RN. DRM

## 2012-02-09 NOTE — Op Note (Signed)
[place]  [address]. [place], [address]   COLONOSCOPY PROCEDURE REPORT  PATIENT: Jacob [name], Jacob [name]  MR#: [id number] BIRTHDATE: August 14, 1945 , 85  yrs. old GENDER: Male ENDOSCOPIST: Ladene Artist, MD, [facility] PROCEDURE DATE:  02/09/2012 PROCEDURE:   Colonoscopy with snare polypectomy and Colonoscopy with biopsy ASA CLASS:   Class II INDICATIONS:Patient's personal history of adenomatous colon polyps.  MEDICATIONS: MAC sedation, administered by CRNA and propofol (Diprivan) 300mg  IV DESCRIPTION OF PROCEDURE:   After the risks benefits and alternatives of the procedure were thoroughly explained, informed consent was obtained.  A digital rectal exam revealed no abnormalities of the rectum.   The [facility] CF-H180AL [id number]  endoscope was introduced through the anus and advanced to the cecum, which was identified by both the appendix and ileocecal valve. No adverse events experienced.   The quality of the prep was good, using MoviPrep  The instrument was then slowly withdrawn as the colon was fully examined.  COLON FINDINGS: Two sessile polyps ranging between 3-57mm in size were found in the transverse colon.  A polypectomy was performed with a cold snare.  The resection was complete and the polyp tissue was completely retrieved.   Two sessile polyps measuring 3 mm in size were found in the sigmoid colon.  A polypectomy was performed with cold forceps.  The resection was complete and the polyp tissue was completely retrieved.   The colon was otherwise normal.  There was no diverticulosis, inflammation, polyps or cancers unless previously stated.  Retroflexed views revealed moderate internal hemorrhoids. The time to cecum=1 minutes 16 seconds.  Withdrawal time=10 minutes 22 seconds.  The scope was withdrawn and the procedure completed.  COMPLICATIONS: There were no complications.  ENDOSCOPIC IMPRESSION: 1.   Two sessile polyps ranging between 3-7 mm in the  transverse colon; polypectomy performed with a cold snare 2.   Two sessile polyps measuring 3 mm in the sigmoid colon; polypectomy performed with cold forceps 3.   Moderate internal hemorrhoids  RECOMMENDATIONS: 1.  Await pathology results 2.  Repeat Colonoscopy in 5 years.   eSigned:  Ladene Artist, MD, [facility] 02/09/2012 10:15 AM   cc: Prince Solian, MD

## 2012-02-10 ENCOUNTER — Telehealth: Payer: Self-pay | Admitting: *Deleted

## 2012-02-10 NOTE — Telephone Encounter (Signed)
  Follow up Call-  Call back number 02/09/2012  Post procedure Call Back phone  # [phone]  Permission to leave phone message Yes     Patient questions:  Do you have a fever, pain , or abdominal swelling? no Pain Score  0 *  Have you tolerated food without any problems? yes  Have you been able to return to your normal activities? yes  Do you have any questions about your discharge instructions: Diet   no Medications  no Follow up visit  no  Do you have questions or concerns about your Care? no  Actions: * If pain score is 4 or above: No action needed, pain <4.

## 2012-02-12 ENCOUNTER — Encounter: Payer: Self-pay | Admitting: Gastroenterology

## 2012-02-16 ENCOUNTER — Telehealth: Payer: Self-pay | Admitting: Gastroenterology

## 2012-02-16 NOTE — Telephone Encounter (Signed)
Patient aware.  He will call back for any additional questions or concerns

## 2012-07-05 ENCOUNTER — Other Ambulatory Visit: Payer: Self-pay | Admitting: Radiation Oncology

## 2012-07-28 ENCOUNTER — Other Ambulatory Visit: Payer: Self-pay | Admitting: Radiation Oncology

## 2012-10-11 ENCOUNTER — Encounter: Payer: Self-pay | Admitting: *Deleted

## 2012-11-08 ENCOUNTER — Emergency Department (INDEPENDENT_AMBULATORY_CARE_PROVIDER_SITE_OTHER)
Admission: EM | Admit: 2012-11-08 | Discharge: 2012-11-08 | Disposition: A | Discharge status: Home / Self Care | Payer: Medicare Other | Source: Home / Self Care | Attending: Family Medicine | Admitting: Family Medicine

## 2012-11-08 ENCOUNTER — Encounter (HOSPITAL_COMMUNITY): Payer: Self-pay | Admitting: Emergency Medicine

## 2012-11-08 DIAGNOSIS — H8309 Labyrinthitis, unspecified ear: Secondary | ICD-10-CM

## 2012-11-08 DIAGNOSIS — H8303 Labyrinthitis, bilateral: Secondary | ICD-10-CM

## 2012-11-08 LAB — POCT I-STAT, CHEM 8
BUN: 25 mg/dL — ABNORMAL HIGH (ref 6–23)
Creatinine, Ser: 1.6 mg/dL — ABNORMAL HIGH (ref 0.50–1.35)
Glucose, Bld: 101 mg/dL — ABNORMAL HIGH (ref 70–99)
HCT: 43 % (ref 39.0–52.0)
Hemoglobin: 14.6 g/dL (ref 13.0–17.0)
Potassium: 4.1 mEq/L (ref 3.5–5.1)
TCO2: 26 mmol/L (ref 0–100)

## 2012-11-08 MED ORDER — MECLIZINE HCL 25 MG PO TABS: 25.0000 mg | ORAL_TABLET | Freq: Three times a day (TID) | ORAL | Status: DC | PRN

## 2012-11-08 NOTE — ED Notes (Signed)
Dizziness for 10 days.  Seen by dr Juventino Slovak prior to this nurse

## 2012-11-08 NOTE — ED Provider Notes (Signed)
CSN: [id number]     Arrival date & time 11/08/12  1549 History   First MD Initiated Contact with Patient 11/08/12 1624     Chief Complaint  Patient presents with  . Dizziness   (Consider location/radiation/quality/duration/timing/severity/associated sxs/prior Treatment) Patient is a 67 y.o. male presenting with neurologic complaint. The history is provided by the patient and the spouse.  Neurologic Problem This is a new problem. The current episode started more than 1 week ago (orthostatic sx when getting up to quickly or lying down to quickly. or turning head.). The problem has been resolved (currently resolved but had a spell this am.). Pertinent negatives include no chest pain, no abdominal pain and no headaches.    Past Medical History  Diagnosis Date  . Prostate cancer 08/14/11    Adenocarcinoma,gleason:3+3=6,& 3+4=7,PSA=5.66  . Anemia     hx iron deficiency  . Hypertension   . Depression   . BPH with obstruction/lower urinary tract symptoms   . ED (erectile dysfunction)   . Night sweats   . Back pain   . Heartburn   . Ulcer     peptic ulcer hx  . Hypercholesterolemia   . Arthritis     gout  . Anxiety   . History of radiation therapy 11/03/11-12/29/11    prostate   Past Surgical History  Procedure Laterality Date  . Prostate biopsy  08/14/11    Adenocarcinoma/volume=58.51cc,gleason=3+3=6 & 3+4=7  . Colon polyps bx  11/24/06    colon,transverse and rectosigmoid polyps:tubular adenomas and hyperplastic polyps,no high grade dysplasia or malignancy   . Gastric bx  11/24/06    chronic active gastritis,with metaplasia and focal changaes of xanthelasma  . Duodenal bx  11/24/06    benign  . Tonsillectomy      67 years old  . Insertion prostate radiation seed  12-29-11   Family History  Problem Relation Age of Onset  . Colon cancer Neg Hx    History  Substance Use Topics  . Smoking status: Former Smoker -- 1.50 packs/day for 30 years    Types: Cigarettes    Quit  date: 04/13/2000  . Smokeless tobacco: Never Used  . Alcohol Use: 3.6 oz/week    6 Cans of beer per week     Comment: 1-2 beers every other night    Review of Systems  Constitutional: Negative.   HENT: Positive for sinus pressure. Negative for tinnitus.   Respiratory: Negative for chest tightness.   Cardiovascular: Negative for chest pain and palpitations.  Gastrointestinal: Negative for abdominal pain.  Neurological: Positive for dizziness. Negative for numbness and headaches.    Allergies  Review of patient's allergies indicates no known allergies.  Home Medications   Current Outpatient Rx  Name  Route  Sig  Dispense  Refill  . furosemide (LASIX) 20 MG tablet   Oral   Take 80 mg by mouth daily.          . Nebivolol HCl (BYSTOLIC) 20 MG TABS   Oral   Take 20 mg by mouth daily. Takes 1/2 tab(10mg )         . VERAPAMIL HCL ER, CO, PO   Oral   Take 240 mg by mouth daily.         Marland Kitchen allopurinol (ZYLOPRIM) 100 MG tablet   Oral   Take 100 mg by mouth as needed.          Marland Kitchen buPROPion (WELLBUTRIN) 100 MG tablet   Oral   Take 150 mg by mouth  daily.          . diazepam (VALIUM) 10 MG tablet   Oral   Take 10 mg by mouth at bedtime as needed. Takes q night to sleep         . gabapentin (NEURONTIN) 800 MG tablet   Oral   Take 1,600 mg by mouth at bedtime.          . meclizine (ANTIVERT) 25 MG tablet   Oral   Take 1 tablet (25 mg total) by mouth 3 (three) times daily as needed for dizziness.   30 tablet   0   . Multiple Vitamins-Minerals (MULTIVITAMIN PO)   Oral   Take 1 tablet by mouth daily.         . tamsulosin (FLOMAX) 0.4 MG CAPS      TAKE 1 CAPSULE BY MOUTH DAILY   30 capsule   3     CYCLE FILL MEDICATION. Authorization is required f ...   . valsartan (DIOVAN) 320 MG tablet   Oral   Take 320 mg by mouth daily.          BP 168/96  Pulse 80  Temp(Src) 98.6 F (37 C) (Oral)  Resp 16  SpO2 96% Physical Exam  Nursing note and vitals  reviewed. Constitutional: He is oriented to person, place, and time. He appears well-developed and well-nourished.  HENT:  Head: Normocephalic.  Right Ear: External ear normal.  Left Ear: External ear normal.  Mouth/Throat: Oropharynx is clear and moist.  Eyes: Conjunctivae and EOM are normal. Pupils are equal, round, and reactive to light. Right eye exhibits no nystagmus. Left eye exhibits no nystagmus.  Neck: Normal range of motion. Neck supple.  Cardiovascular: Normal rate, regular rhythm, normal heart sounds and intact distal pulses.   Pulmonary/Chest: Effort normal and breath sounds normal.  Neurological: He is alert and oriented to person, place, and time.  Skin: Skin is warm and dry.    ED Course  Procedures (including critical care time) Labs Review Labs Reviewed  POCT I-STAT, CHEM 8 - Abnormal; Notable for the following:    BUN 25 (*)    Creatinine, Ser 1.60 (*)    Glucose, Bld 101 (*)    All other components within normal limits   Imaging Review No results found.    MDM  Orthostatic vs and istat 8 wnl.    Billy Fischer, MD 11/08/12 [id number]

## 2013-02-21 ENCOUNTER — Emergency Department (HOSPITAL_COMMUNITY)
Admission: EM | Admit: 2013-02-21 | Discharge: 2013-02-21 | Disposition: A | Discharge status: Home / Self Care | Payer: Medicare Other | Source: Home / Self Care | Attending: Family Medicine | Admitting: Family Medicine

## 2013-02-21 ENCOUNTER — Emergency Department (INDEPENDENT_AMBULATORY_CARE_PROVIDER_SITE_OTHER): Payer: Medicare Other

## 2013-02-21 ENCOUNTER — Encounter (HOSPITAL_COMMUNITY): Payer: Self-pay | Admitting: Emergency Medicine

## 2013-02-21 DIAGNOSIS — J069 Acute upper respiratory infection, unspecified: Secondary | ICD-10-CM

## 2013-02-21 IMAGING — CR DG CHEST 2V
2 series · 2 of 2 positions shown · non-contrast
Comparison: None.

CLINICAL DATA: Cough and congestion for 1 week.

EXAM:
CHEST  2 VIEW

[view not recorded (1 of 2)]
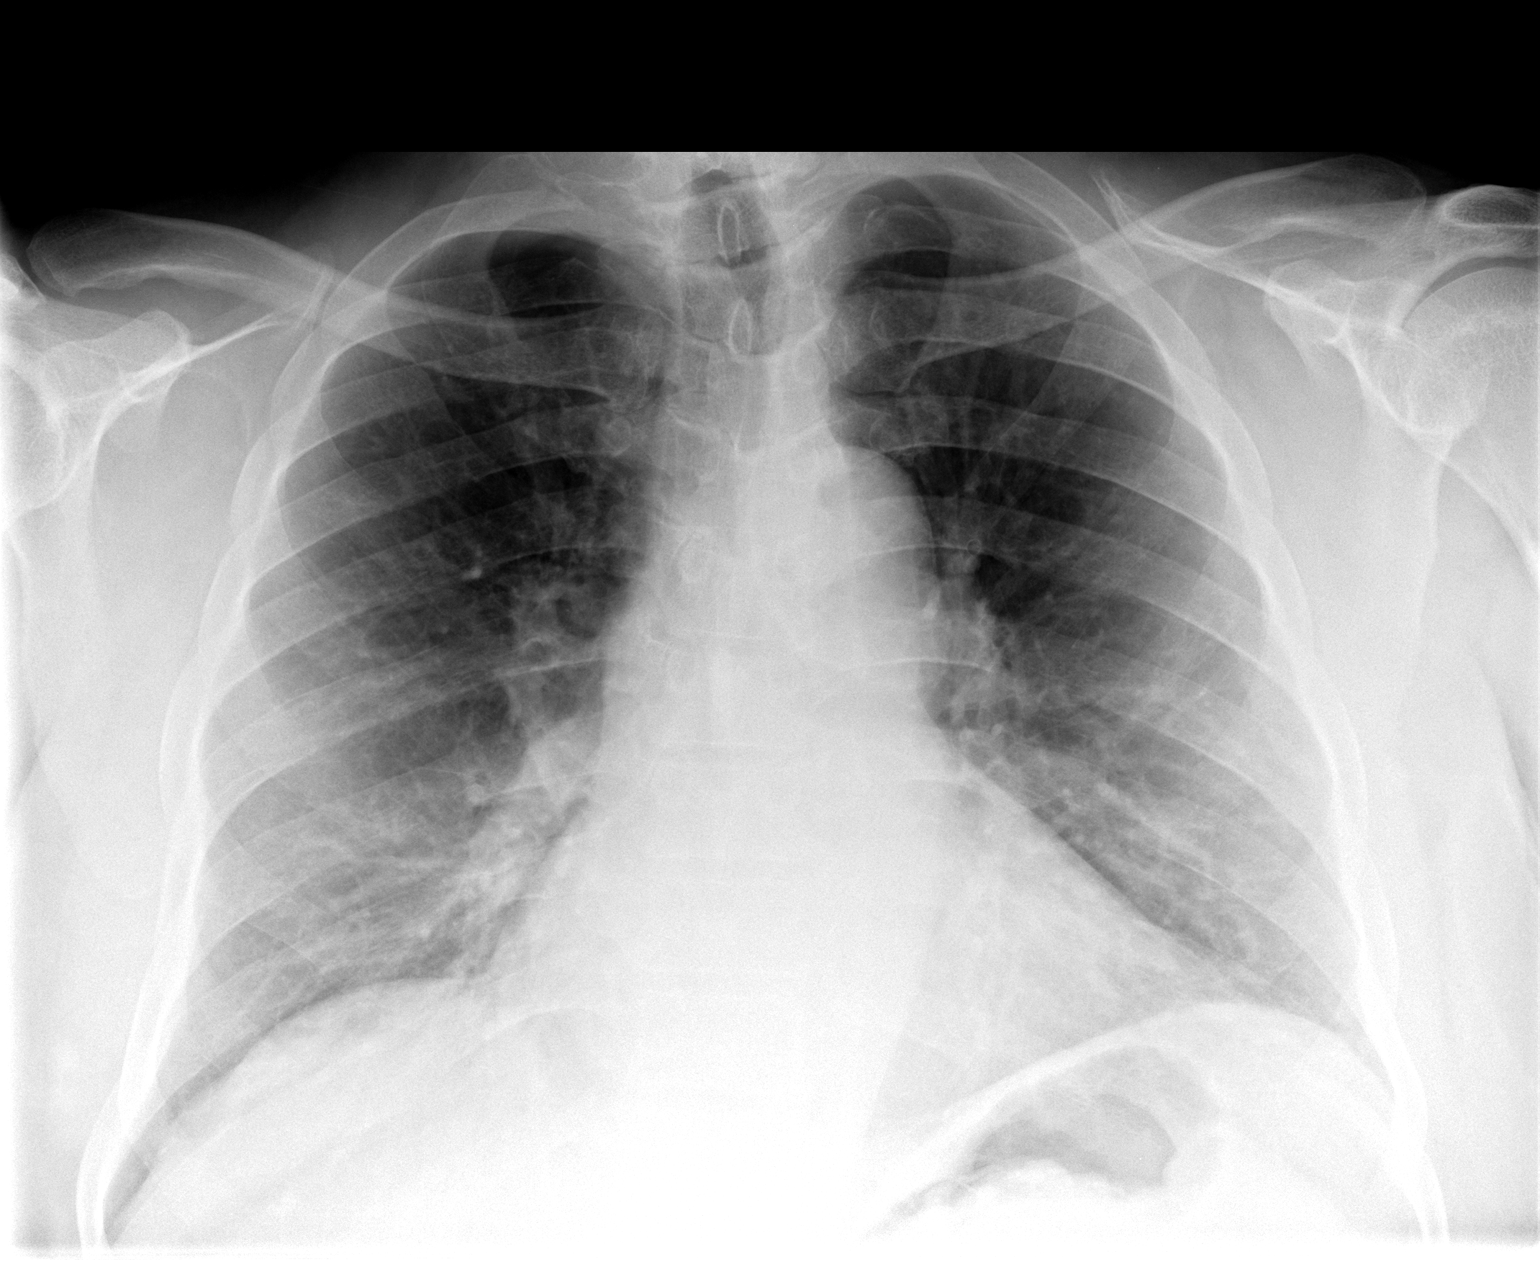

[view not recorded (2 of 2)]
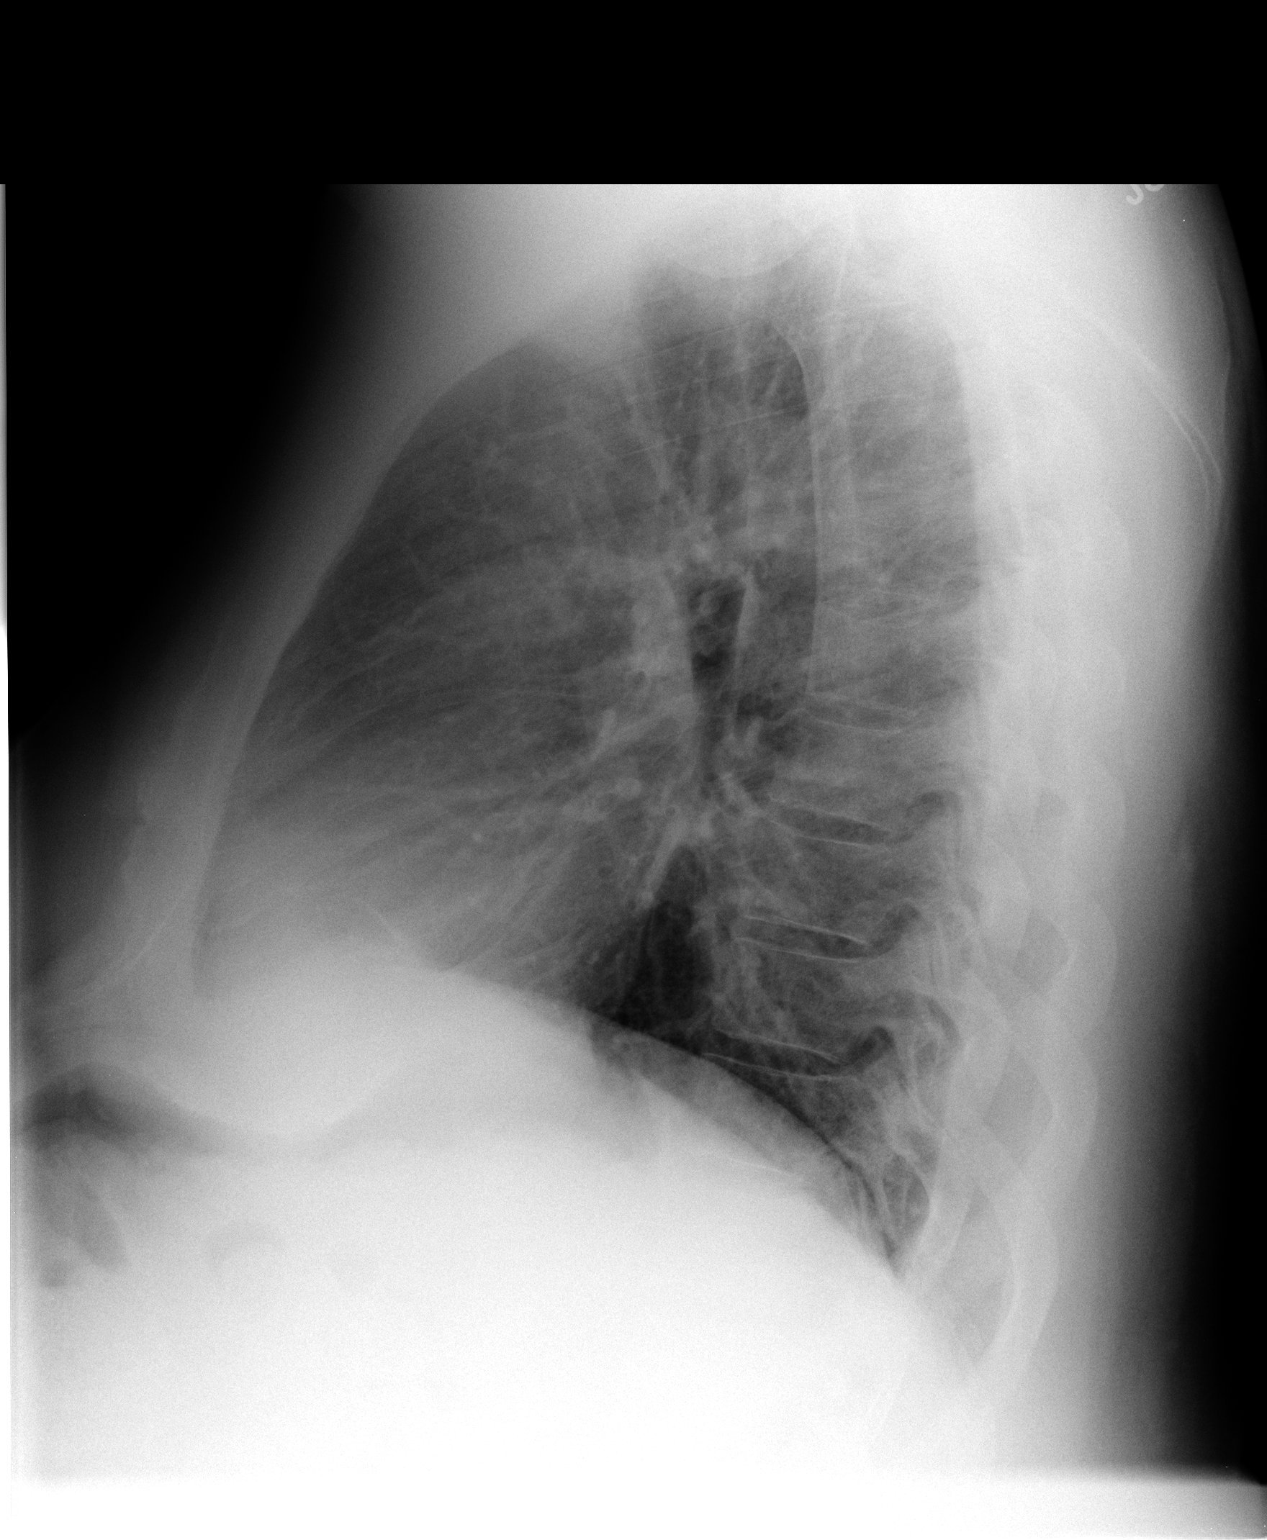

[2 of 2 positions shown; findings below may reference images not displayed]

FINDINGS: The lungs are clear. Heart size is new normal. No pneumothorax or
pleural effusion. No focal bony abnormality.
IMPRESSION: No acute disease.

## 2013-02-21 MED ORDER — MINOCYCLINE HCL 100 MG PO CAPS: 100.0000 mg | ORAL_CAPSULE | Freq: Two times a day (BID) | ORAL | Status: DC

## 2013-02-21 MED ORDER — IPRATROPIUM-ALBUTEROL 0.5-2.5 (3) MG/3ML IN SOLN
6.0000 mL | Freq: Once | RESPIRATORY_TRACT | Status: AC
  Administered 2013-02-21: 6 mL via RESPIRATORY_TRACT

## 2013-02-21 MED ORDER — IPRATROPIUM-ALBUTEROL 0.5-2.5 (3) MG/3ML IN SOLN
RESPIRATORY_TRACT | Status: AC
Start: 2013-02-21 — End: 2013-02-21
  Filled 2013-02-21: qty 6

## 2013-02-21 MED ORDER — METHYLPREDNISOLONE (PAK) 4 MG PO TABS: ORAL_TABLET | ORAL | Status: DC

## 2013-02-21 MED ORDER — ALBUTEROL SULFATE HFA 108 (90 BASE) MCG/ACT IN AERS: 2.0000 | INHALATION_SPRAY | RESPIRATORY_TRACT | Status: DC | PRN

## 2013-02-21 MED ORDER — HYDROCODONE-HOMATROPINE 5-1.5 MG/5ML PO SYRP: 5.0000 mL | ORAL_SOLUTION | Freq: Four times a day (QID) | ORAL | Status: DC | PRN

## 2013-02-21 NOTE — ED Notes (Signed)
C/o productive cough with sob x 1 wk.  Yellow sputum.  Denies fever, nausea vomiting and diarrhea.  No relief with otc meds.

## 2013-02-21 NOTE — ED Provider Notes (Signed)
CSN: [id number]     Arrival date & time 02/21/13  1448 History   First MD Initiated Contact with Patient 02/21/13 1614     Chief Complaint  Patient presents with  . Cough     (Consider location/radiation/quality/duration/timing/severity/associated sxs/prior Treatment) HPI Comments: 68 year old male presents complaining of cough and shortness of breath for one week. His cough has been productive of sputum. He has been taking is over-the-counter medications without relief. He has not had any fever, NVD, chest pain, pleuritic chest pain. No leg swelling. No recent travel, he has close contact with his wife has a similar illness  Patient is a 68 y.o. male presenting with cough.  Cough Associated symptoms: shortness of breath   Associated symptoms: no chest pain, no chills, no ear pain, no fever, no myalgias, no rash, no rhinorrhea and no sore throat     Past Medical History  Diagnosis Date  . Prostate cancer 08/14/11    Adenocarcinoma,gleason:3+3=6,& 3+4=7,PSA=5.66  . Anemia     hx iron deficiency  . Hypertension   . Depression   . BPH with obstruction/lower urinary tract symptoms   . ED (erectile dysfunction)   . Night sweats   . Back pain   . Heartburn   . Ulcer     peptic ulcer hx  . Hypercholesterolemia   . Arthritis     gout  . Anxiety   . History of radiation therapy 11/03/11-12/29/11    prostate   Past Surgical History  Procedure Laterality Date  . Prostate biopsy  08/14/11    Adenocarcinoma/volume=58.51cc,gleason=3+3=6 & 3+4=7  . Colon polyps bx  11/24/06    colon,transverse and rectosigmoid polyps:tubular adenomas and hyperplastic polyps,no high grade dysplasia or malignancy   . Gastric bx  11/24/06    chronic active gastritis,with metaplasia and focal changaes of xanthelasma  . Duodenal bx  11/24/06    benign  . Tonsillectomy      68 years old  . Insertion prostate radiation seed  12-29-11   Family History  Problem Relation Age of Onset  . Colon cancer Neg Hx     History  Substance Use Topics  . Smoking status: Former Smoker -- 1.50 packs/day for 30 years    Types: Cigarettes    Quit date: 04/13/2000  . Smokeless tobacco: Never Used  . Alcohol Use: 3.6 oz/week    6 Cans of beer per week     Comment: 1-2 beers every other night    Review of Systems  Constitutional: Negative for fever, chills and fatigue.  HENT: Positive for congestion. Negative for ear pain, postnasal drip, rhinorrhea, sinus pressure and sore throat.   Eyes: Negative for visual disturbance.  Respiratory: Positive for cough and shortness of breath.   Cardiovascular: Negative for chest pain, palpitations and leg swelling.  Gastrointestinal: Negative for nausea, vomiting, abdominal pain, diarrhea and constipation.  Genitourinary: Negative for dysuria, urgency, frequency and hematuria.  Musculoskeletal: Negative for arthralgias, myalgias, neck pain and neck stiffness.  Skin: Negative for rash.  Neurological: Negative for dizziness, weakness and light-headedness.      Allergies  Review of patient's allergies indicates no known allergies.  Home Medications   Current Outpatient Rx  Name  Route  Sig  Dispense  Refill  . allopurinol (ZYLOPRIM) 100 MG tablet   Oral   Take 100 mg by mouth as needed.          Marland Kitchen buPROPion (WELLBUTRIN) 100 MG tablet   Oral   Take 150 mg by mouth  daily.          . diazepam (VALIUM) 10 MG tablet   Oral   Take 10 mg by mouth at bedtime as needed. Takes q night to sleep         . furosemide (LASIX) 20 MG tablet   Oral   Take 80 mg by mouth daily.          . furosemide (LASIX) 80 MG tablet   Oral   Take 80 mg by mouth.         . gabapentin (NEURONTIN) 800 MG tablet   Oral   Take 1,600 mg by mouth at bedtime.          Marland Kitchen LOSARTAN POTASSIUM-HCTZ PO   Oral   Take by mouth.         . METOPROLOL TARTRATE PO   Oral   Take by mouth.         . OMEPRAZOLE PO   Oral   Take by mouth.         . VERAPAMIL HCL ER, CO,  PO   Oral   Take 240 mg by mouth daily.         Marland Kitchen albuterol (PROVENTIL HFA;VENTOLIN HFA) 108 (90 BASE) MCG/ACT inhaler   Inhalation   Inhale 2 puffs into the lungs every 4 (four) hours as needed for wheezing.   1 Inhaler   0   . HYDROcodone-homatropine (HYCODAN) 5-1.5 MG/5ML syrup   Oral   Take 5 mLs by mouth every 6 (six) hours as needed for cough.   120 mL   0   . meclizine (ANTIVERT) 25 MG tablet   Oral   Take 1 tablet (25 mg total) by mouth 3 (three) times daily as needed for dizziness.   30 tablet   0   . methylPREDNIsolone (MEDROL DOSPACK) 4 MG tablet      follow package directions   21 tablet   0   . minocycline (MINOCIN) 100 MG capsule   Oral   Take 1 capsule (100 mg total) by mouth 2 (two) times daily.   20 capsule   0   . Multiple Vitamins-Minerals (MULTIVITAMIN PO)   Oral   Take 1 tablet by mouth daily.         . Nebivolol HCl (BYSTOLIC) 20 MG TABS   Oral   Take 20 mg by mouth daily. Takes 1/2 tab(10mg )         . tamsulosin (FLOMAX) 0.4 MG CAPS      TAKE 1 CAPSULE BY MOUTH DAILY   30 capsule   3     CYCLE FILL MEDICATION. Authorization is required f ...   . valsartan (DIOVAN) 320 MG tablet   Oral   Take 320 mg by mouth daily.          BP 160/92  Pulse 79  Temp(Src) 98.2 F (36.8 C) (Oral)  Resp 16  SpO2 95% Physical Exam  Nursing note and vitals reviewed. Constitutional: He is oriented to person, place, and time. He appears well-developed and well-nourished. No distress.  HENT:  Head: Normocephalic and atraumatic.  Right Ear: External ear normal.  Left Ear: External ear normal.  Nose: Nose normal.  Mouth/Throat: Oropharynx is clear and moist. No oropharyngeal exudate.  Eyes: Conjunctivae are normal. Right eye exhibits no discharge. Left eye exhibits no discharge.  Neck: Normal range of motion. Neck supple.  Cardiovascular: Normal rate, regular rhythm and normal heart sounds.  Exam reveals no gallop and no  friction rub.   No  murmur heard. Pulmonary/Chest: Effort normal. Not tachypneic. No respiratory distress. He has no decreased breath sounds. He has wheezes (diffuse). He has no rhonchi. He has no rales.  Lymphadenopathy:    He has no cervical adenopathy.  Neurological: He is alert and oriented to person, place, and time. Coordination normal.  Skin: Skin is warm and dry. No rash noted. He is not diaphoretic.  Psychiatric: He has a normal mood and affect. Judgment normal.    ED Course  Procedures (including critical care time) Labs Review Labs Reviewed - No data to display Imaging Review Dg Chest 2 View  02/21/2013   CLINICAL DATA:  Cough and congestion for 1 week.  EXAM: CHEST  2 VIEW  COMPARISON:  None.  FINDINGS: The lungs are clear. Heart size is new normal. No pneumothorax or pleural effusion. No focal bony abnormality.  IMPRESSION: No acute disease.   Electronically Signed   By: Inge Rise M.D.   On: 02/21/2013 16:34    significant improvement symptomatically in to auscultation with a due now, still minimal wheezing in the bases bilaterally, no rales   MDM   Final diagnoses:  URI (upper respiratory infection)    No Radiographic evidence of pneumonia. However, with worsening course over a week, we'll still given antibiotic to cover for atypical infection. Followup when necessary   Meds ordered this encounter  Medications  . ipratropium-albuterol (DUONEB) 0.5-2.5 (3) MG/3ML nebulizer solution 6 mL    Sig:   . minocycline (MINOCIN) 100 MG capsule    Sig: Take 1 capsule (100 mg total) by mouth 2 (two) times daily.    Dispense:  20 capsule    Refill:  0    Order Specific Question:  Supervising Provider    Answer:  Billy Fischer [phone]  . methylPREDNIsolone (MEDROL DOSPACK) 4 MG tablet    Sig: follow package directions    Dispense:  21 tablet    Refill:  0    Order Specific Question:  Supervising Provider    Answer:  Billy Fischer [phone]  . albuterol (PROVENTIL HFA;VENTOLIN HFA) 108  (90 BASE) MCG/ACT inhaler    Sig: Inhale 2 puffs into the lungs every 4 (four) hours as needed for wheezing.    Dispense:  1 Inhaler    Refill:  0    Order Specific Question:  Supervising Provider    Answer:  Ihor Gully D [id number]  . HYDROcodone-homatropine (HYCODAN) 5-1.5 MG/5ML syrup    Sig: Take 5 mLs by mouth every 6 (six) hours as needed for cough.    Dispense:  120 mL    Refill:  0    Order Specific Question:  Supervising Provider    Answer:  Ihor Gully D [[id number]]        Liam Graham, PA-C 02/21/13 1710

## 2013-02-21 NOTE — Discharge Instructions (Signed)

## 2013-02-23 NOTE — ED Provider Notes (Signed)
Medical screening examination/treatment/procedure(s) were performed by resident physician or non-physician practitioner and as supervising physician I was immediately available for consultation/collaboration.   Pauline Good MD.   Billy Fischer, MD 02/23/13 [phone]

## 2013-03-03 ENCOUNTER — Other Ambulatory Visit: Payer: Self-pay | Admitting: Radiation Oncology

## 2013-03-16 ENCOUNTER — Encounter (HOSPITAL_COMMUNITY): Payer: Self-pay | Admitting: Emergency Medicine

## 2013-03-16 ENCOUNTER — Emergency Department (INDEPENDENT_AMBULATORY_CARE_PROVIDER_SITE_OTHER)
Admission: EM | Admit: 2013-03-16 | Discharge: 2013-03-16 | Disposition: A | Discharge status: Home / Self Care | Payer: Medicare Other | Source: Home / Self Care

## 2013-03-16 DIAGNOSIS — R198 Other specified symptoms and signs involving the digestive system and abdomen: Secondary | ICD-10-CM

## 2013-03-16 DIAGNOSIS — Z711 Person with feared health complaint in whom no diagnosis is made: Secondary | ICD-10-CM

## 2013-03-16 NOTE — ED Notes (Signed)
Reports his navel was larger this am . Denies pain . Reporting normal gas and bm's

## 2013-03-16 NOTE — ED Provider Notes (Signed)
CSN: [id number]     Arrival date & time 03/16/13  1803 History   First MD Initiated Contact with Patient 03/16/13 1856     Chief Complaint  Patient presents with  . Umbilical Hernia   (Consider location/radiation/quality/duration/timing/severity/associated sxs/prior Treatment) HPI Comments: 68 year old male presents with a concern regarding the appearance of his umbilicus. He noticed last night that it looked as though it was bulging. He denies any associated symptoms. It is not tender nor is it painful. He has no GI symptoms or abdominal pain whatsoever. He states he would not know it spares her less he had left. States he retired one year ago and since then has gained 10 pounds. He is a rather large, obese abdomen.   Past Medical History  Diagnosis Date  . Prostate cancer 08/14/11    Adenocarcinoma,gleason:3+3=6,& 3+4=7,PSA=5.66  . Anemia     hx iron deficiency  . Hypertension   . Depression   . BPH with obstruction/lower urinary tract symptoms   . ED (erectile dysfunction)   . Night sweats   . Back pain   . Heartburn   . Ulcer     peptic ulcer hx  . Hypercholesterolemia   . Arthritis     gout  . Anxiety   . History of radiation therapy 11/03/11-12/29/11    prostate   Past Surgical History  Procedure Laterality Date  . Prostate biopsy  08/14/11    Adenocarcinoma/volume=58.51cc,gleason=3+3=6 & 3+4=7  . Colon polyps bx  11/24/06    colon,transverse and rectosigmoid polyps:tubular adenomas and hyperplastic polyps,no high grade dysplasia or malignancy   . Gastric bx  11/24/06    chronic active gastritis,with metaplasia and focal changaes of xanthelasma  . Duodenal bx  11/24/06    benign  . Tonsillectomy      68 years old  . Insertion prostate radiation seed  12-29-11   Family History  Problem Relation Age of Onset  . Colon cancer Neg Hx    History  Substance Use Topics  . Smoking status: Former Smoker -- 1.50 packs/day for 30 years    Types: Cigarettes    Quit date:  04/13/2000  . Smokeless tobacco: Never Used  . Alcohol Use: 3.6 oz/week    6 Cans of beer per week     Comment: 1-2 beers every other night    Review of Systems  Constitutional: Negative.   Respiratory: Negative.   Cardiovascular: Negative.   Gastrointestinal: Negative.   Genitourinary: Negative.     Allergies  Review of patient's allergies indicates no known allergies.  Home Medications   Current Outpatient Rx  Name  Route  Sig  Dispense  Refill  . albuterol (PROVENTIL HFA;VENTOLIN HFA) 108 (90 BASE) MCG/ACT inhaler   Inhalation   Inhale 2 puffs into the lungs every 4 (four) hours as needed for wheezing.   1 Inhaler   0   . allopurinol (ZYLOPRIM) 100 MG tablet   Oral   Take 100 mg by mouth as needed.          Marland Kitchen buPROPion (WELLBUTRIN) 100 MG tablet   Oral   Take 150 mg by mouth daily.          . diazepam (VALIUM) 10 MG tablet   Oral   Take 10 mg by mouth at bedtime as needed. Takes q night to sleep         . furosemide (LASIX) 20 MG tablet   Oral   Take 80 mg by mouth daily.          Marland Kitchen  furosemide (LASIX) 80 MG tablet   Oral   Take 80 mg by mouth.         . gabapentin (NEURONTIN) 800 MG tablet   Oral   Take 1,600 mg by mouth at bedtime.          Marland Kitchen HYDROcodone-homatropine (HYCODAN) 5-1.5 MG/5ML syrup   Oral   Take 5 mLs by mouth every 6 (six) hours as needed for cough.   120 mL   0   . LOSARTAN POTASSIUM-HCTZ PO   Oral   Take by mouth.         . meclizine (ANTIVERT) 25 MG tablet   Oral   Take 1 tablet (25 mg total) by mouth 3 (three) times daily as needed for dizziness.   30 tablet   0   . methylPREDNIsolone (MEDROL DOSPACK) 4 MG tablet      follow package directions   21 tablet   0   . METOPROLOL TARTRATE PO   Oral   Take by mouth.         . minocycline (MINOCIN) 100 MG capsule   Oral   Take 1 capsule (100 mg total) by mouth 2 (two) times daily.   20 capsule   0   . Multiple Vitamins-Minerals (MULTIVITAMIN PO)    Oral   Take 1 tablet by mouth daily.         . Nebivolol HCl (BYSTOLIC) 20 MG TABS   Oral   Take 20 mg by mouth daily. Takes 1/2 tab(10mg )         . OMEPRAZOLE PO   Oral   Take by mouth.         . tamsulosin (FLOMAX) 0.4 MG CAPS capsule      TAKE 1 CAPSULE BY MOUTH DAILY   30 capsule   2     CYCLE FILL MEDICATION. Authorization is required f ...   . valsartan (DIOVAN) 320 MG tablet   Oral   Take 320 mg by mouth daily.         Marland Kitchen VERAPAMIL HCL ER, CO, PO   Oral   Take 240 mg by mouth daily.          BP 141/84  Pulse 76  Temp(Src) 98.2 F (36.8 C) (Oral)  Resp 16  SpO2 98% Physical Exam  Nursing note and vitals reviewed. Constitutional: He is oriented to person, place, and time. He appears well-developed and well-nourished. No distress.  Neck: Normal range of motion. Neck supple.  Cardiovascular: Normal rate.   Pulmonary/Chest: Effort normal. No respiratory distress.  Abdominal: Soft. Bowel sounds are normal. He exhibits no distension and no mass. There is no tenderness. There is no rebound and no guarding.  The umbilicus is slightly convex. It is soft and nontender. Deep palpation reveals a 1/2 cm ring within the abdominal wall. There is no palpable hernia. No pain with palpation. No discoloration. Plan sitting up in the exam there is an obvious diastasis recti. This is nontender or symptomatic.  Neurological: He is alert and oriented to person, place, and time. He exhibits normal muscle tone.  Skin: Skin is warm and dry.    ED Course  Procedures (including critical care time) Labs Review Labs Reviewed - No data to display Imaging Review No results found.   MDM   1. Physically well but worried     As of now there is no palpable hernia and he is completely asymptomatic. He will be given information on umbilical hernias in  with Lasix to watch for. Followup with your PCP for recheck. Her symptoms we discussed that may be problematic see your doctor or  the emergency department.    Janne Napoleon, NP 03/16/13 1935

## 2013-03-16 NOTE — Discharge Instructions (Signed)
Emergency Department Screening Exam A medical screening exam helps find the cause of your problem. This exam also helps determine if your problem requires treatment right away. Your exam has shown that you do not require immediate emergency treatment. It is safe for you to go to your caregiver's office or clinic for treatment. Plans may have been made for you to be seen by your regular caregiver today. Patients must be treated in an emergency department regardless of their ability to pay. You can decide to stay and receive continued treatment in the emergency department. Some insurance plans may not cover the cost of this service. In some, but not all, states in the [place] the hospital and/or your caregiver might bill you directly for your evaluation and treatment in the emergency department. If your condition worsens or changes in any way, return for re-evaluation or you may go to your caregiver's office or clinic for treatment. Document Released: 03/28/2008 Document Revised: 03/24/2011 Document Reviewed: 03/28/2008 [facility] Patient Information 2014 [place], [place].

## 2013-03-17 NOTE — ED Provider Notes (Signed)
Medical screening examination/treatment/procedure(s) were performed by a resident physician or non-physician practitioner and as the supervising physician I was immediately available for consultation/collaboration.  Lynne Leader, MD    Gregor Hams, MD 03/17/13 [phone]

## 2013-05-25 ENCOUNTER — Other Ambulatory Visit: Payer: Self-pay | Admitting: Radiation Oncology

## 2013-07-26 DIAGNOSIS — R5383 Other fatigue: Secondary | ICD-10-CM | POA: Insufficient documentation

## 2013-08-16 ENCOUNTER — Other Ambulatory Visit: Payer: Self-pay | Admitting: Radiation Oncology

## 2013-08-30 DIAGNOSIS — N182 Chronic kidney disease, stage 2 (mild): Secondary | ICD-10-CM | POA: Insufficient documentation

## 2013-09-02 ENCOUNTER — Encounter: Payer: Self-pay | Admitting: Neurology

## 2013-09-02 ENCOUNTER — Ambulatory Visit (INDEPENDENT_AMBULATORY_CARE_PROVIDER_SITE_OTHER): Payer: Medicare Other | Admitting: Neurology

## 2013-09-02 VITALS — BP 131/81 | HR 76 | Ht 74.75 in | Wt 292.0 lb

## 2013-09-02 DIAGNOSIS — Z9989 Dependence on other enabling machines and devices: Principal | ICD-10-CM

## 2013-09-02 DIAGNOSIS — Z91199 Patient's noncompliance with other medical treatment and regimen due to unspecified reason: Secondary | ICD-10-CM | POA: Insufficient documentation

## 2013-09-02 DIAGNOSIS — Z9119 Patient's noncompliance with other medical treatment and regimen: Secondary | ICD-10-CM

## 2013-09-02 DIAGNOSIS — G4733 Obstructive sleep apnea (adult) (pediatric): Secondary | ICD-10-CM

## 2013-09-02 NOTE — Progress Notes (Signed)
SLEEP MEDICINE CLINIC   Provider:  Larey Seat, M D  Referring Provider: Tivis Ringer, MD Primary Care Physician:  Jacob Ringer, MD  Chief Complaint  Patient presents with  . New Evaluation    Room 10  . Sleep consult    HPI:  Jacob [name] is a african american , right handed, married  68 y.o. male and  seen here as a referral  from Dr. Dagmar [name] for evaluation of sleep apnea ,treatment options.    The patient  had chiefly complained about not being able to sleep without a sleep aid.  He had a first sleep study in 2005,  And was told he slept fine during the study, but  This was not the case at home.  He would only sleep about 2 hours wake up and then often stays up for a long time the comorbidity was hypertension. He was counseled on weight loss ,  considered obese with a body mass index of 37 at the time of the study and endorsed the Epworth sleepiness scale 15 points. His next diagnostic study was an ambulatory sleep study -home sleep test -dated 08-20-12 , which demonstrated apnea only at an index of 8 per hour and 02 saturation nadir of 78%.  Jacob [name] underwent a sleep study at the [facility] as of Summer last year. This titration  study was dated 10-11-12 and interpreted by a sleep medicine specialist Jacob [name].  The impression was, that the patient had obstructive sleep apnea and required 11 cm water pressure to allow REM and non-REM sleep in the supine and lateral recumbent position. Patient was fitted with a full face mask and has not been able to tolerate this well he often takes his mask of or "loses' it  in the middle of the night- seems to take it off when not aware of it,  in addition he has a full. Mustache and chin beard and a full face mask is not likely to achieve a good seal.   He tried a nasal pillow, but lost this at night ,too. It "fits too easy ", is too lose. He is now frustrated, and looking for a sleep clinic closer to his home.    By now, his data do suggest a low compliance and he may need to undergo a new sleep study in order to have services covered.  The AHI of 8 as seen on his HST is mild, and I would concentrate on seeing him evaluated in the lab over night, possible SPLIT. This was Jacob [name] intention as well.    The patient is a former smoker but lives with a smoker, is continuously exposed to passive smoke.   The patient usually goes to bed between 1 and 2 AM, he takes trazodone, gabapentin  and melatonin to achieve a lower sleep latency. He would usually need about 20 minutes to fall asleep. He will stay asleep for about 4 hours, has one or 2 we'll bathroom breaks at night. He rises in the morning absent between 9 and 10 AM. The patient wakes up spontaneously does not need to rely on alarm usually. He is now retired. This over all nocturnal sleep time is estimated  6-7 hours. He rarely naps in daytime. When he naps, its a 30 minute power nap. 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 He leaves the house infrequently . 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 Retired in  March 2014 .         Review of Systems: Out of a complete 14 system review, the patient complains of only the following symptoms, and all other reviewed systems are negative. Fatigue, sleep quality, unable to tolerate CPAP.  Epworth score  9 , Fatigue severity score  40 , depression score 12    History   Social History  . Marital Status: Married    Spouse Name: Jacob [name]    Number of Children: 3  . Years of Education: 12   Occupational History  .  Jacob [name]   Social History Main Topics  . Smoking status: Former Smoker -- 1.50 packs/day for 30 years    Types: Cigarettes    Quit date: 04/13/2001  . Smokeless tobacco: Never Used  . Alcohol Use: 3.6 oz/week    6 Cans of beer per week     Comment: 1-2 beers every other night  . Drug Use: No  . Sexual Activity: Not Currently   Other Topics  Concern  . Not on file   Social History Narrative   Patient is married Jacob [name]) and lives at home with his wife and one child.   Patient has three children.   Patient is retired.   Patient has a high school education.   Patient is ambi-dextrous.   Patient drinks two cups of coffee about three times a week.     Family History  Problem Relation Age of Onset  . Colon cancer Neg Hx     Past Medical History  Diagnosis Date  . Prostate cancer 08/14/11    Adenocarcinoma,gleason:3+3=6,& 3+4=7,PSA=5.66  . Anemia     hx iron deficiency  . Hypertension   . Depression   . BPH with obstruction/lower urinary tract symptoms   . ED (erectile dysfunction)   . Night sweats   . Back pain   . Heartburn   . Ulcer     peptic ulcer hx  . Hypercholesterolemia   . Arthritis     gout  . Anxiety   . History of radiation therapy 11/03/11-12/29/11    prostate    Past Surgical History  Procedure Laterality Date  . Prostate biopsy  08/14/11    Adenocarcinoma/volume=58.51cc,gleason=3+3=6 & 3+4=7  . Colon polyps bx  11/24/06    colon,transverse and rectosigmoid polyps:tubular adenomas and hyperplastic polyps,no high grade dysplasia or malignancy   . Gastric bx  11/24/06    chronic active gastritis,with metaplasia and focal changaes of xanthelasma  . Duodenal bx  11/24/06    benign  . Tonsillectomy      69 years old  . Insertion prostate radiation seed  12-29-11    Current Outpatient Prescriptions  Medication Sig Dispense Refill  . allopurinol (ZYLOPRIM) 100 MG tablet Take 100 mg by mouth as needed.       Marland Kitchen buPROPion (WELLBUTRIN) 100 MG tablet Take 150 mg by mouth daily.       . furosemide (LASIX) 80 MG tablet Take 80 mg by mouth. Taking 1/2 tablet      . gabapentin (NEURONTIN) 800 MG tablet Take 1,600 mg by mouth at bedtime.       Marland Kitchen LOSARTAN POTASSIUM-HCTZ PO Take by mouth.      . Melatonin 10 MG TABS Take 1 tablet by mouth daily.      . Nebivolol HCl (BYSTOLIC) 20 MG TABS Take 20 mg by  mouth daily. Takes 1/2 tab(10mg )      . OMEPRAZOLE PO Take by mouth.      Marland Kitchen  tamsulosin (FLOMAX) 0.4 MG CAPS capsule TAKE 1 CAPSULE BY MOUTH DAILY  30 capsule  0  . VALERIAN PO Take 1 tablet by mouth at bedtime.      . valsartan (DIOVAN) 320 MG tablet Take 320 mg by mouth daily.      Marland Kitchen VERAPAMIL HCL ER, CO, PO Take 240 mg by mouth daily.       No current facility-administered medications for this visit.    Allergies as of 09/02/2013  . (No Known Allergies)    Vitals: BP 131/81  Pulse 76  Ht 6' 2.75" (1.899 m)  Wt 292 lb (132.45 kg)  BMI 36.73 kg/m2 Last Weight:  Wt Readings from Last 1 Encounters:  09/02/13 292 lb (132.45 kg)       Last Height:   Ht Readings from Last 1 Encounters:  09/02/13 6' 2.75" (1.899 m)    Physical exam:  General: The patient is awake, alert and appears not in acute distress. The patient is well groomed. Head: Normocephalic, atraumatic. Neck is supple. Mallampati 4,  neck circumference: .18.25 Nasal airflow restricted , TMJ is  Not  evident . Retrognathia is not seen. Dentures- a dental device cannot work in this patient.  Cardiovascular:  Regular rate and rhythm, without  murmurs or carotid bruit, and without distended neck veins. Respiratory: Lungs are clear to auscultation. Skin:  Without evidence of edema, or rash Trunk: BMI is  elevated and the patient  has normal posture.  Neurologic exam : The patient is awake and alert, oriented to place and time.   Memory subjective  described as intact. There is a normal attention span & concentration ability.  Speech is fluent with  dysphonia or aphasia. Mood and affect are depressed. The patient appears psychomotor slowed , delayed in reaction time, paperwork took him 30 minutes , etc.  Cranial nerves: Pupils are equal and briskly reactive to light. Funduscopic exam without  evidence of pallor or edema. Extraocular movements  in vertical and horizontal planes intact and without nystagmus. Visual fields by  finger perimetry are intact. Hearing to finger rub intact.  Facial sensation intact to fine touch. Facial motor strength is symmetric and tongue and uvula move midline.  Motor exam:   Normal tone ,muscle bulk and symmetric ,strength in all extremities.  Sensory:  Fine touch, pinprick and vibration were tested in all extremities. Proprioception is normal.  Coordination: Rapid alternating movements , Finger-to-nose maneuver  normal without evidence of ataxia, dysmetria or tremor.  Gait and station: Patient walks without assistive device and is able unassisted to climb up to the exam table. Strength within normal limits. Stance is stable and normal.  Deep tendon reflexes: in the  upper and lower extremities are attenuated, but symmetric . Babinski maneuver downgoing.   Assessment:  After physical and neurologic examination, review of laboratory studies, imaging, neurophysiology testing and pre-existing records, assessment is  1) patient with high BMI, neck size and Mallampati 4, large tongue- all risk factors for apnea.  Yet, I am not convinced that his HST reflected the degree of apnea in a valid fashion. An AHI of 8 could be treated with a dental device if no associated REM desaturation were noted.   2) the cognitive impairment  I witnessed here may be an overhang effect fromTrazodone.  The patient forgot his CPAP machine in his car , was asked to bring it, but didn't correlate this to bringing it into the clinic 3) overweight.  4) depression , High GDS score  of 12 , clinical depression is evident.     The patient was advised of the nature of the diagnosed sleep disorder , the treatment options and risks for general a health and wellness arising from not treating the condition. Visit duration was 60 minutes. education was 30 minutes.   Plan:  Treatment plan and additional workup :  SPLIT study , repeat needed- as his CPAP machine was not used for 3 month- non compliance rules of CMS and  [employer] are similar.  Try nasal mask or Pillow , CO2 retention check, BMI is 35. Advise patient to bring his medications to the lab.  Patient wears dentures , is there a way to fit his mask without , please.  Split at AHI 10 and score at 4 % , get some supine sleep, please. patient is a later sleeper.       Asencion Partridge Shelvia Fojtik MD  09/02/2013

## 2013-09-02 NOTE — Patient Instructions (Signed)
Depression Depression refers to feeling sad, low, down in the dumps, blue, gloomy, or empty. In general, there are two kinds of depression: 1. Normal sadness or normal grief. This kind of depression is one that we all feel from time to time after upsetting life experiences, such as the loss of a job or the ending of a relationship. This kind of depression is considered normal, is short lived, and resolves within a few days to 2 weeks. Depression experienced after the loss of a loved one (bereavement) often lasts longer than 2 weeks but normally gets better with time. 2. Clinical depression. This kind of depression lasts longer than normal sadness or normal grief or interferes with your ability to function at home, at work, and in school. It also interferes with your personal relationships. It affects almost every aspect of your life. Clinical depression is an illness. Symptoms of depression can also be caused by conditions other than those mentioned above, such as:  Physical illness. Some physical illnesses, including underactive thyroid gland (hypothyroidism), severe anemia, specific types of cancer, diabetes, uncontrolled seizures, heart and lung problems, strokes, and chronic pain are commonly associated with symptoms of depression.  Side effects of some prescription medicine. In some people, certain types of medicine can cause symptoms of depression.  Substance abuse. Abuse of alcohol and illicit drugs can cause symptoms of depression. SYMPTOMS Symptoms of normal sadness and normal grief include the following:  Feeling sad or crying for short periods of time.  Not caring about anything (apathy).  Difficulty sleeping or sleeping too much.  No longer able to enjoy the things you used to enjoy.  Desire to be by oneself all the time (social isolation).  Lack of energy or motivation.  Difficulty concentrating or remembering.  Change in appetite or weight.  Restlessness or  agitation. Symptoms of clinical depression include the same symptoms of normal sadness or normal grief and also the following symptoms:  Feeling sad or crying all the time.  Feelings of guilt or worthlessness.  Feelings of hopelessness or helplessness.  Thoughts of suicide or the desire to harm yourself (suicidal ideation).  Loss of touch with reality (psychotic symptoms). Seeing or hearing things that are not real (hallucinations) or having false beliefs about your life or the people around you (delusions and paranoia). DIAGNOSIS  The diagnosis of clinical depression is usually based on how bad the symptoms are and how long they have lasted. Your health care provider will also ask you questions about your medical history and substance use to find out if physical illness, use of prescription medicine, or substance abuse is causing your depression. Your health care provider may also order blood tests. TREATMENT  Often, normal sadness and normal grief do not require treatment. However, sometimes antidepressant medicine is given for bereavement to ease the depressive symptoms until they resolve. The treatment for clinical depression depends on how bad the symptoms are but often includes antidepressant medicine, counseling with a mental health professional, or both. Your health care provider will help to determine what treatment is best for you. Depression caused by physical illness usually goes away with appropriate medical treatment of the illness. If prescription medicine is causing depression, talk with your health care provider about stopping the medicine, decreasing the dose, or changing to another medicine. Depression caused by the abuse of alcohol or illicit drugs goes away when you stop using these substances. Some adults need professional help in order to stop drinking or using drugs. SEEK IMMEDIATE MEDICAL   CARE IF:  You have thoughts about hurting yourself or others.  You lose touch  with reality (have psychotic symptoms).  You are taking medicine for depression and have a serious side effect. FOR MORE INFORMATION  National Alliance on Mental Illness: www.nami.[employer] of [employer]: https://carter.com/ Document Released: 12/28/1999 Document Revised: 05/16/2013 Document Reviewed: 03/31/2011 [facility] Patient Information 2015 [place], [place]. This information is not intended to replace advice given to you by your health care provider. Make sure you discuss any questions you have with your health care provider. Sleep Apnea Sleep apnea is disorder that affects a person's sleep. A person with sleep apnea has abnormal pauses in their breathing when they sleep. It is hard for them to get a good sleep. This makes a person tired during the day. It also can lead to other physical problems. There are three types of sleep apnea. One type is when breathing stops for a short time because your airway is blocked (obstructive sleep apnea). Another type is when the brain sometimes fails to give the normal signal to breathe to the muscles that control your breathing (central sleep apnea). The third type is a combination of the other two types. HOME CARE  Do not sleep on your back. Try to sleep on your side.  Take all medicine as told by your doctor.  Avoid alcohol, calming medicines (sedatives), and depressant drugs.  Try to lose weight if you are overweight. Talk to your doctor about a healthy weight goal. Your doctor may have you use a device that helps to open your airway. It can help you get the air that you need. It is called a positive airway pressure (PAP) device. There are three types of PAP devices:  Continuous positive airway pressure (CPAP) device.  Nasal expiratory positive airway pressure (EPAP) device.  Bilevel positive airway pressure (BPAP) device. MAKE SURE YOU:  Understand these instructions.  Will watch your condition.  Will get help right away  if you are not doing well or get worse. Document Released: 10/09/2007 Document Revised: 12/17/2011 Document Reviewed: 05/03/2011 [facility] Patient Information 2015 [place], [place]. This information is not intended to replace advice given to you by your health care provider. Make sure you discuss any questions you have with your health care provider.

## 2013-09-25 ENCOUNTER — Encounter: Payer: Self-pay | Admitting: Neurology

## 2013-09-25 ENCOUNTER — Ambulatory Visit (INDEPENDENT_AMBULATORY_CARE_PROVIDER_SITE_OTHER): Payer: Medicare Other

## 2013-09-25 VITALS — BP 143/93

## 2013-09-25 DIAGNOSIS — Z9989 Dependence on other enabling machines and devices: Principal | ICD-10-CM

## 2013-09-25 DIAGNOSIS — G4733 Obstructive sleep apnea (adult) (pediatric): Secondary | ICD-10-CM

## 2013-09-25 DIAGNOSIS — G4710 Hypersomnia, unspecified: Secondary | ICD-10-CM

## 2013-09-25 DIAGNOSIS — R0902 Hypoxemia: Secondary | ICD-10-CM

## 2013-10-05 ENCOUNTER — Telehealth: Payer: Self-pay | Admitting: *Deleted

## 2013-10-05 NOTE — Telephone Encounter (Signed)
Called and left patient a detailed message indicating that no sleep apnea was observed during his overnight sleep study.  I informed him that a follow up with a pulmonologist had been recommended and that Dr. Montez Morita could coordinate that referral for him if he does not already see a pulmonologist.  Patient was informed that we would send him a copy of the doctors report and a copy would be provided to Dr. Prince Solian.  Patient was informed if he had further questions to call out office.

## 2013-10-12 ENCOUNTER — Encounter: Payer: Self-pay | Admitting: Gastroenterology

## 2013-11-04 ENCOUNTER — Ambulatory Visit (INDEPENDENT_AMBULATORY_CARE_PROVIDER_SITE_OTHER): Payer: Medicare Other | Admitting: Pulmonary Disease

## 2013-11-04 ENCOUNTER — Encounter: Payer: Self-pay | Admitting: Pulmonary Disease

## 2013-11-04 ENCOUNTER — Encounter (INDEPENDENT_AMBULATORY_CARE_PROVIDER_SITE_OTHER): Payer: Self-pay

## 2013-11-04 ENCOUNTER — Ambulatory Visit (INDEPENDENT_AMBULATORY_CARE_PROVIDER_SITE_OTHER)
Admission: RE | Admit: 2013-11-04 | Discharge: 2013-11-04 | Disposition: A | Discharge status: Home / Self Care | Payer: Medicare Other | Source: Ambulatory Visit | Attending: Pulmonary Disease | Admitting: Pulmonary Disease

## 2013-11-04 VITALS — BP 122/82 | HR 71 | Temp 98.2°F | Ht 74.0 in | Wt 295.2 lb

## 2013-11-04 DIAGNOSIS — G4734 Idiopathic sleep related nonobstructive alveolar hypoventilation: Secondary | ICD-10-CM

## 2013-11-04 IMAGING — CR DG CHEST 2V
2 series · 2 of 2 positions shown · non-contrast
Comparison: [DATE]

CLINICAL DATA: 67-year-old male with a history of hypertension.
History of smoking.

EXAM:
CHEST - 2 VIEW

[view not recorded (1 of 2)]
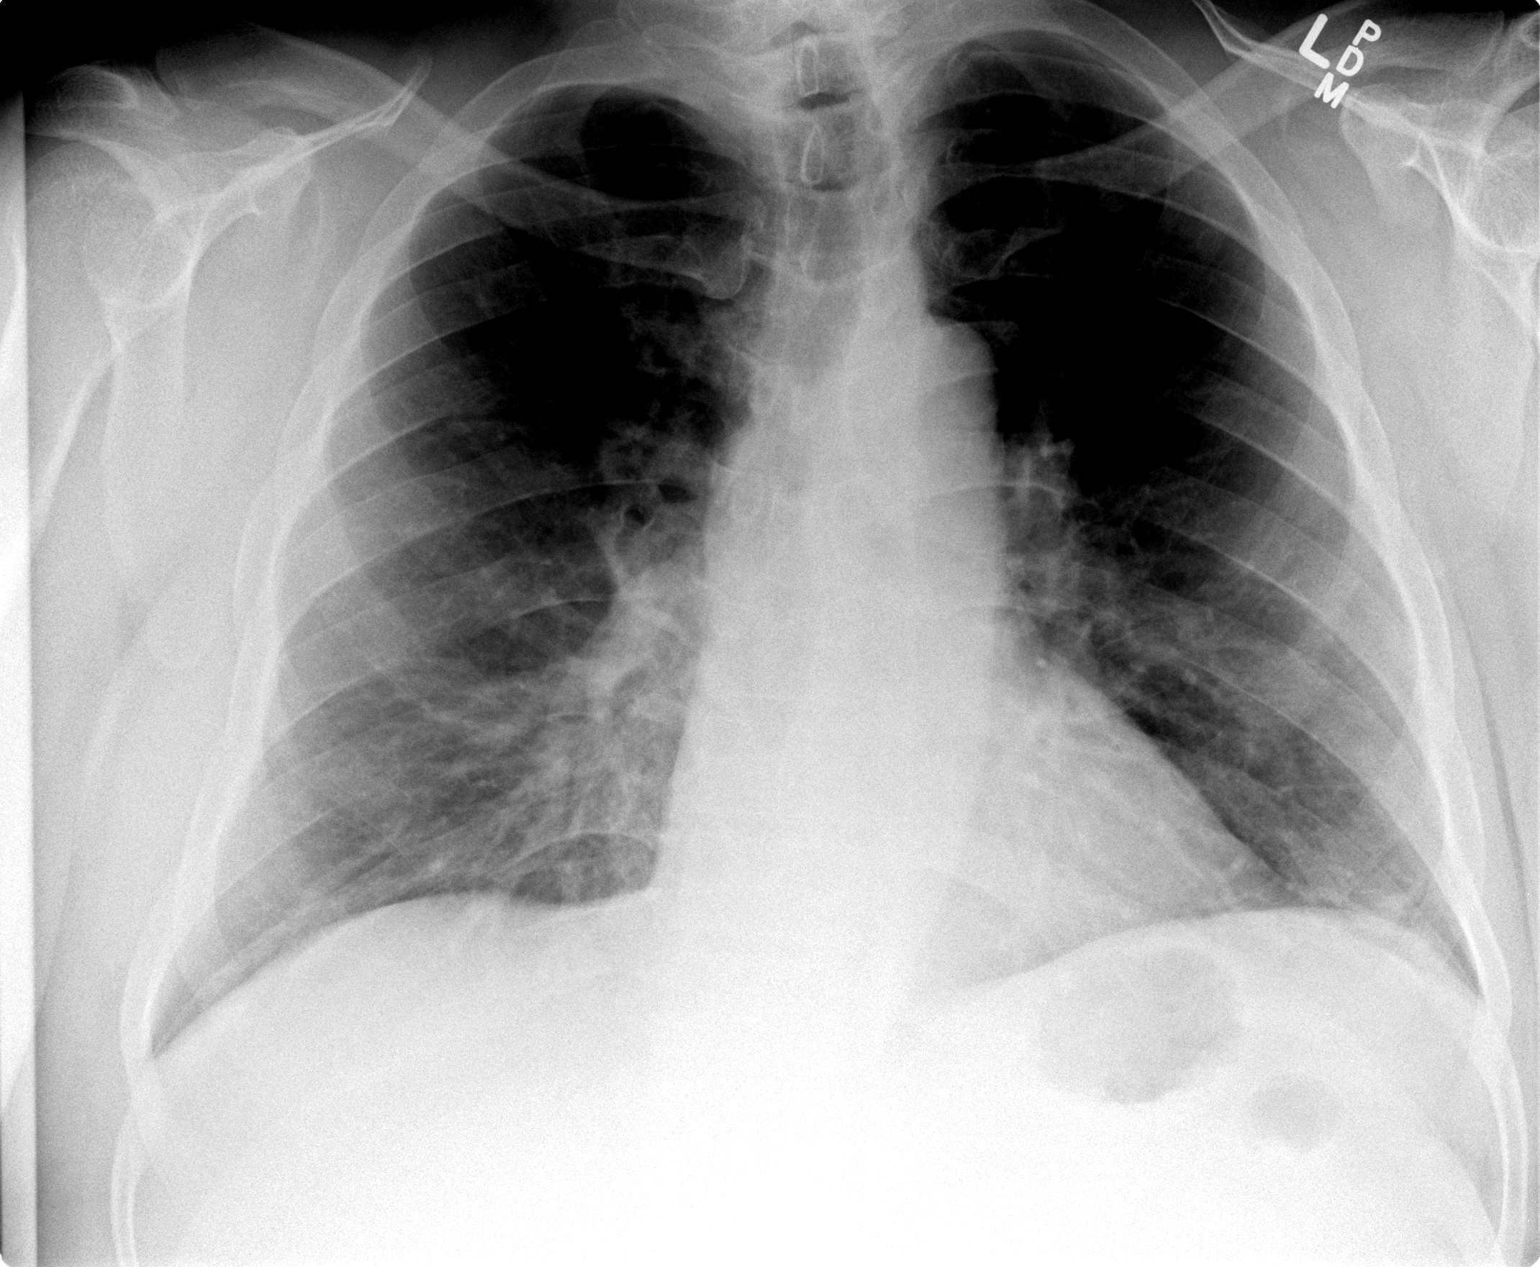

[view not recorded (2 of 2)]
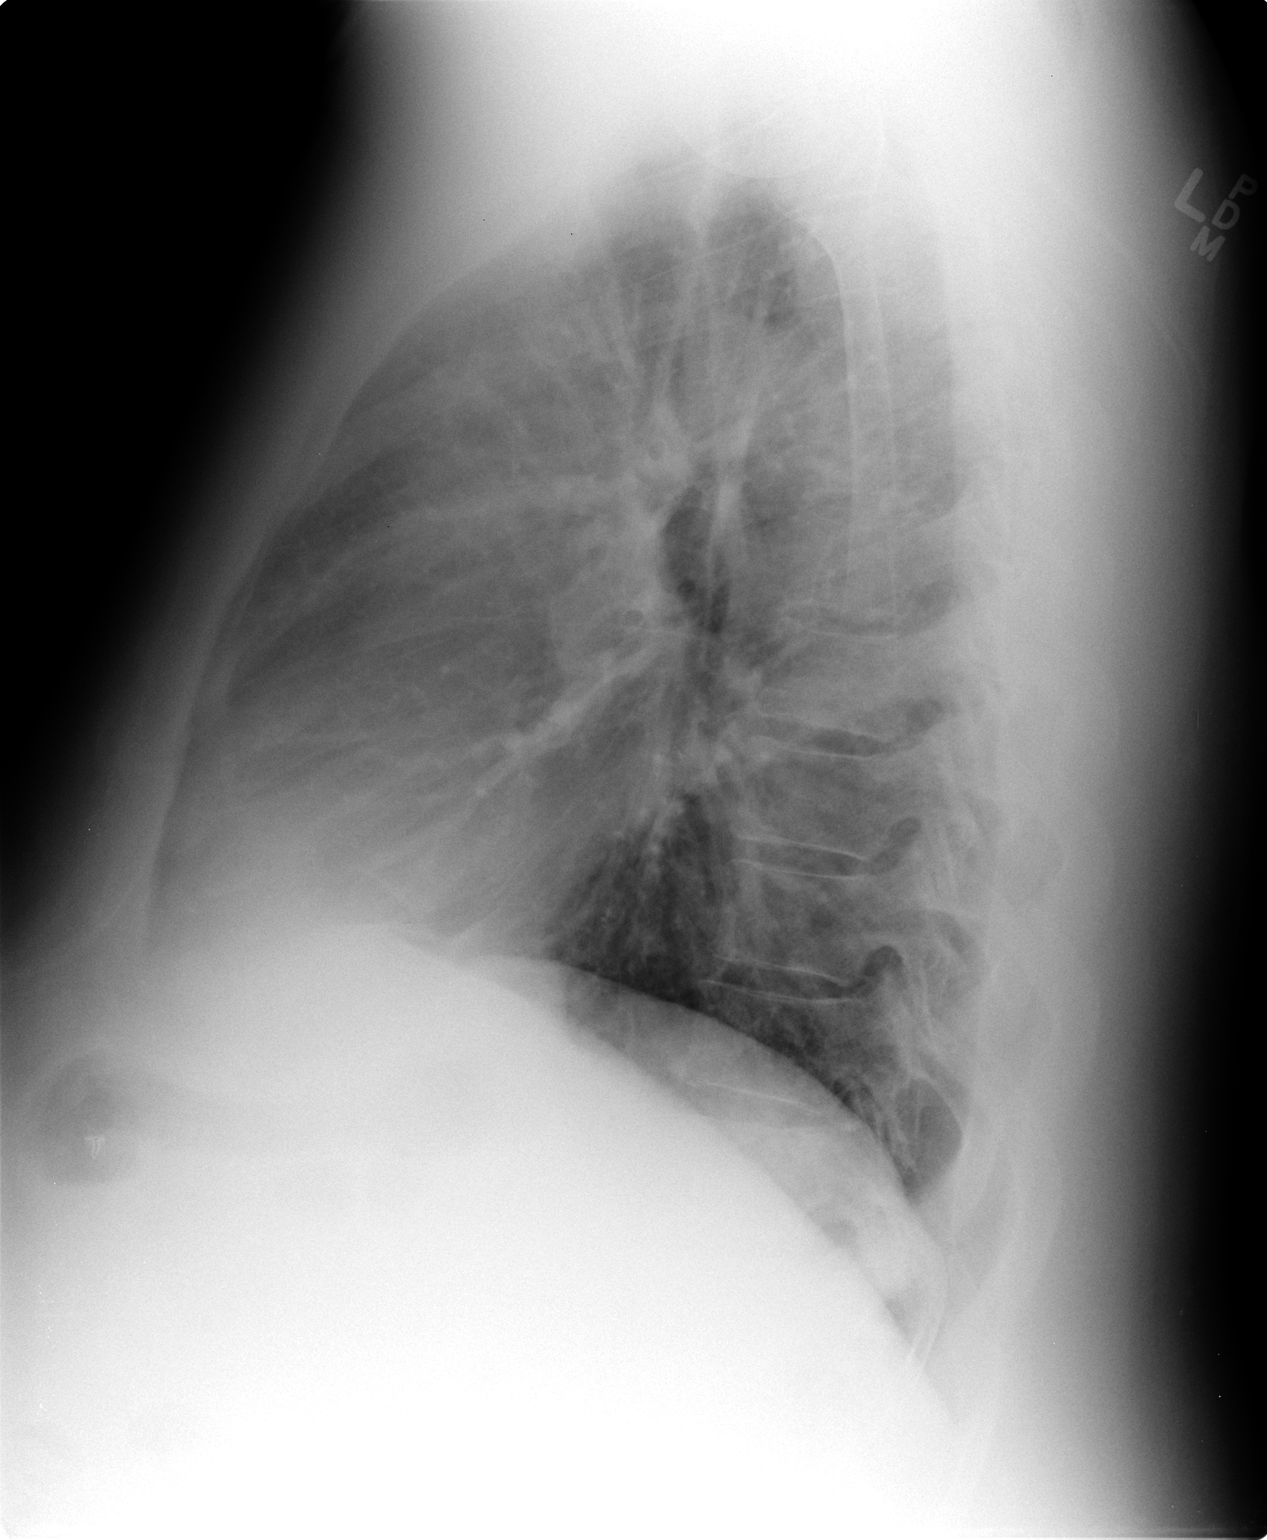

[2 of 2 positions shown; findings below may reference images not displayed]

FINDINGS: Cardiomediastinal silhouette projects within normal limits in size
and contour. No confluent airspace disease, pneumothorax, or pleural
effusion. Mildly coarsening of interstitial markings. These are
favored to be chronic given their presents on the comparison.

No displaced fracture.

Unremarkable appearance of the upper abdomen.
IMPRESSION: No radiographic evidence of acute cardiopulmonary disease.

## 2013-11-04 NOTE — Patient Instructions (Signed)
Stop cpap for now, and will start on oxygen during sleep at 2 liters Will check chest xray today, and call you with results. Will schedule for breathing studies in the next 2-3 weeks, and see you back the same day to review. Work on weight loss, since this is contributing to your sleeping issue at night

## 2013-11-04 NOTE — Assessment & Plan Note (Signed)
The patient has significant nocturnal hypoxemia documented by his recent sleep study, but surprisingly he does not have obstructive sleep apnea. Even his study from a year ago showed only minimal OSA, with an AHI of only 8 events per hour. I suspect his oxygen desaturation and possibly his sleep disruption is related to an underlying breathing issues such as COPD, or possibly related to a thoracic restrictive disorder related to his morbid obesity and possibly hypoventilation. His end-tidal CO2 did increase during the sleep study, however it was not calibrated with an arterial blood gas to be accurate. I do not think it is significant enough to need bilevel for noninvasive positive pressure ventilation. I do think he needs to have pulmonary function studies given his long smoking history to exclude the possibility of obstructive lung disease. He is also describing an element of significant insomnia related to poor sleep hygiene and caring for his ill wife. I have encouraged him to work aggressively on weight loss, since this may help his sleep issues significantly.

## 2013-11-04 NOTE — Progress Notes (Signed)
Subjective:    Patient ID: Jacob [name], male    DOB: July 10, 1945, 68 y.o.   MRN: [id number]  HPI The patient is a 68 year old male who I've been asked to see for management of obstructive sleep apnea. He underwent a home sleep test in July 2014 that showed minimal OSA, with an AHI of only 8 events per hour. He ultimately underwent a CPAP titration study that showed an optimal pressure of 11 cm. He had wanted to change his CPAP treatment to the [facility] area with a local home care company, and recently underwent a formal NPS G. in September of this year, which showed an AHI of only 3 of events per hour. This is not consistent with the diagnosis of obstructive sleep apnea. He did have oxygen desaturation as low as 81%, and also had an increase in his end tidal CO2. However, this was not calibrated with an arterial blood gas. The patient has had sleeping issues for years, but currently it seems to be more related to poor sleep hygiene and insomnia related to caring for his ill spouse.  When he is trying to wear CPAP most recently, he will pull it off after 3 hours or so for unknown reasons. The patient has a long history of smoking, but has not done so since 2002. He tells me that he has never had pulmonary function studies, and his last chest x-ray was a few years ago. He describes more fatigue with exertional activities than actual shortness of breath.  He also complains of a chronic cough with white foamy mucus production for years.   Sleep Questionnaire What time do you typically go to bed?( Between what hours) 1-2AM 1-2AM at 1417 on 11/04/13 by Inge Rise, CMA How long does it take you to fall asleep? 30 min 30 min at 1417 on 11/04/13 by Inge Rise, CMA How many times during the night do you wake up? 2 2 at 1417 on 11/04/13 by Inge Rise, CMA What time do you get out of bed to start your day? 0830 0830 at 1417 on 11/04/13 by Inge Rise, CMA Do you drive or operate heavy  machinery in your occupation? No No at 1417 on 11/04/13 by Inge Rise, CMA How much has your weight changed (up or down) over the past two years? (In pounds) 10 lb (4.536 kg) 10 lb (4.536 kg) at 1417 on 11/04/13 by Inge Rise, CMA Have you ever had a sleep study before? Yes Yes at 1417 on 11/04/13 by Inge Rise, CMA If yes, [facility] [place] [facility] [place] [facility] [place] and [facility] at 1417 on 11/04/13 by Inge Rise, CMA If yes, date of study? 09/2012 and 09/2013 09/2012 and 09/2013 at 1417 on 11/04/13 by Inge Rise, CMA Do you currently use CPAP? Yes Yes at 1417 on 11/04/13 by Inge Rise, Colon If so, what pressure? not sure not sure at 1417 on 11/04/13 by Inge Rise, CMA Do you wear oxygen at any time? No No at 1417 on 11/04/13 by Inge Rise, CMA   Review of Systems  Constitutional: Negative for fever and unexpected weight change.  HENT: Negative for congestion, dental problem, ear pain, nosebleeds, postnasal drip, rhinorrhea, sinus pressure, sneezing, sore throat and trouble swallowing.   Eyes: Negative for redness and itching.  Respiratory: Negative for cough, chest tightness, shortness of breath and wheezing.   Cardiovascular: Negative for palpitations and leg  swelling.  Gastrointestinal: Negative for nausea and vomiting.  Genitourinary: Negative for dysuria.  Musculoskeletal: Negative for joint swelling.  Skin: Negative for rash.  Neurological: Negative for headaches.  Hematological: Does not bruise/bleed easily.  Psychiatric/Behavioral: Negative for dysphoric mood. The patient is not nervous/anxious.        Objective:   Physical Exam Constitutional:  Morbidly obese, no acute distress  HENT:  Nares patent without discharge  Oropharynx without exudate, palate and uvula are mildly elongated  Eyes:  Perrla, eomi, no scleral icterus  Neck:  No JVD, no TMG  Cardiovascular:  Normal rate, regular rhythm, no rubs or gallops.  No  murmurs        Intact distal pulses but decreased  Pulmonary :  Mildly decreased breath sounds, no stridor or respiratory distress   No rales, rhonchi, or wheezing  Abdominal:  Soft, nondistended, bowel sounds present.  No tenderness noted.   Musculoskeletal:  + lower extremity edema noted.  Lymph Nodes:  No cervical lymphadenopathy noted  Skin:  No cyanosis noted  Neurologic:  Alert, appropriate, moves all 4 extremities without obvious deficit.         Assessment & Plan:

## 2013-11-07 ENCOUNTER — Telehealth: Payer: Self-pay | Admitting: Pulmonary Disease

## 2013-11-07 NOTE — Telephone Encounter (Signed)
You do not have to have a titration study if there is NO sleep apnea.  [facility], please find me a dme who will not make this poor pt have an ONO.  Nothing changes in 40 days vs 30 days!!

## 2013-11-07 NOTE — Telephone Encounter (Signed)
Let the pt know and ok to do.

## 2013-11-07 NOTE — Telephone Encounter (Signed)
Called [facility] and spoke with Iris States that per Medicare, qualifying sats have to retrieved within 30 days of O2 order being placed. Sleep Study testing was done outside the 30 day window. Test 09/25/13, order placed 11/04/13 Per Iris, if using sleep study there has to be a noted desat from a titration study, not a PSG.  Also, they need another Resp Dx other than Hypoxemia (this is no longer covered by Medicare)  Dr Gwenette Greet, the patient will need an ONO and different res dx in order for pt to be approved for O2

## 2013-11-07 NOTE — Telephone Encounter (Signed)
[place] I WOULD LOVE TO DO THAT BUT THIS PT IS MEDICARE AND IT HAS A 30DAY WINDOW SATS HAVE TO BE WITHIN THE 30DAYS NOT 40 PT WILL HAVE TO HAVE ONO SORRY Joellen Jersey

## 2013-11-08 NOTE — Telephone Encounter (Signed)
Spoke to pt he is aware of ono to be done Joellen Jersey

## 2013-11-08 NOTE — Telephone Encounter (Signed)
i need an order put in for overnight oximetry for [facility] thanks Joellen Jersey

## 2013-11-08 NOTE — Telephone Encounter (Signed)
Noted by triage Nothing further needed; will sign off.

## 2013-11-17 ENCOUNTER — Ambulatory Visit (HOSPITAL_COMMUNITY)
Admission: RE | Admit: 2013-11-17 | Discharge: 2013-11-17 | Disposition: A | Discharge status: Home / Self Care | Payer: Medicare Other | Source: Ambulatory Visit | Attending: Pulmonary Disease | Admitting: Pulmonary Disease

## 2013-11-17 ENCOUNTER — Ambulatory Visit: Payer: Medicare Other | Admitting: Pulmonary Disease

## 2013-11-17 DIAGNOSIS — R0902 Hypoxemia: Secondary | ICD-10-CM | POA: Diagnosis present

## 2013-11-17 DIAGNOSIS — Z87891 Personal history of nicotine dependence: Secondary | ICD-10-CM | POA: Diagnosis not present

## 2013-11-17 DIAGNOSIS — G4734 Idiopathic sleep related nonobstructive alveolar hypoventilation: Secondary | ICD-10-CM

## 2013-11-17 LAB — PULMONARY FUNCTION TEST
DL/VA % PRED: 97 %
DL/VA: 4.71 ml/min/mmHg/L
DLCO UNC % PRED: 72 %
DLCO UNC: 27.3 ml/min/mmHg
FEF 25-75 Post: 4.14 L/sec
FEF 25-75 Pre: 3.65 L/sec
FEF2575-%Change-Post: 13 %
FEF2575-%PRED-PRE: 123 %
FEF2575-%Pred-Post: 139 %
FEV1-%Change-Post: 2 %
FEV1-%Pred-Post: 93 %
FEV1-%Pred-Pre: 91 %
FEV1-Post: 3.22 L
FEV1-Pre: 3.14 L
FEV1FVC-%Change-Post: 3 %
FEV1FVC-%Pred-Pre: 106 %
FEV6-%CHANGE-POST: 0 %
FEV6-%PRED-PRE: 88 %
FEV6-%Pred-Post: 87 %
FEV6-POST: 3.77 L
FEV6-Pre: 3.8 L
FEV6FVC-%CHANGE-POST: 0 %
FEV6FVC-%PRED-POST: 104 %
FEV6FVC-%Pred-Pre: 103 %
FVC-%CHANGE-POST: -1 %
FVC-%PRED-POST: 84 %
FVC-%PRED-PRE: 84 %
FVC-Post: 3.78 L
FVC-Pre: 3.81 L
PRE FEV1/FVC RATIO: 82 %
Post FEV1/FVC ratio: 85 %
Post FEV6/FVC ratio: 100 %
Pre FEV6/FVC Ratio: 100 %
RV % pred: 84 %
RV: 2.22 L
TLC % pred: 75 %
TLC: 5.91 L

## 2013-11-17 MED ORDER — ALBUTEROL SULFATE (2.5 MG/3ML) 0.083% IN NEBU
2.5000 mg | INHALATION_SOLUTION | Freq: Once | RESPIRATORY_TRACT | Status: AC
  Administered 2013-11-17: 2.5 mg via RESPIRATORY_TRACT

## 2013-11-25 ENCOUNTER — Ambulatory Visit (INDEPENDENT_AMBULATORY_CARE_PROVIDER_SITE_OTHER): Payer: Medicare Other | Admitting: Pulmonary Disease

## 2013-11-25 ENCOUNTER — Encounter: Payer: Self-pay | Admitting: Pulmonary Disease

## 2013-11-25 VITALS — BP 136/82 | HR 68 | Temp 98.6°F | Ht 74.0 in | Wt 296.4 lb

## 2013-11-25 DIAGNOSIS — G4734 Idiopathic sleep related nonobstructive alveolar hypoventilation: Secondary | ICD-10-CM

## 2013-11-25 NOTE — Progress Notes (Signed)
   Subjective:    Patient ID: Jacob [name], male    DOB: May 01, 1945, 68 y.o.   MRN: [id number]  HPI The patient comes in today for follow-up of his recent pulmonary function studies. He was found to have no airflow obstruction, minimal restriction, and a mild reduction in diffusion capacity that corrects with alveolar volume adjustment. I have reviewed the studies with him in detail, and answered all of his questions. He tells me that he still has not received his nocturnal oxygen.   Review of Systems  Constitutional: Negative for fever and unexpected weight change.  HENT: Negative for congestion, dental problem, ear pain, nosebleeds, postnasal drip, rhinorrhea, sinus pressure, sneezing, sore throat and trouble swallowing.   Eyes: Negative for redness and itching.  Respiratory: Positive for cough and wheezing. Negative for chest tightness and shortness of breath.   Cardiovascular: Negative for palpitations and leg swelling.  Gastrointestinal: Negative for nausea and vomiting.  Genitourinary: Negative for dysuria.  Musculoskeletal: Negative for joint swelling.  Skin: Negative for rash.  Neurological: Negative for headaches.  Hematological: Does not bruise/bleed easily.  Psychiatric/Behavioral: Negative for dysphoric mood. The patient is not nervous/anxious.        Objective:   Physical Exam Obese male in no acute distress Nose without purulence or discharge noted Neck without lymphadenopathy or thyromegaly Chest totally clear to auscultation, no wheezing Cardiac exam with regular rate and rhythm Lower extremities with mild edema, no cyanosis Alert and oriented, moves all 4 extremities.       Assessment & Plan:

## 2013-11-25 NOTE — Patient Instructions (Signed)
Your breathing studies are basically normal.  I suspect your oxygen level falls at night while sleeping because of your weight  Will order your overnight oxygen test again.  If you do not hear from them in by next week, please let us know.  Work on weight loss. followup with me again as needed

## 2013-11-25 NOTE — Assessment & Plan Note (Signed)
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 Currently, the insurance company will not let us use his sleep study, and therefore he will need an overnight oximetry so that we can get him started on nocturnal oxygen. The patient has no significant findings on his chest x-ray, and his pulmonary function test showed minimal restriction related to his centripetal obesity. Surprisingly, there is no airflow obstruction. I suspect his nocturnal desaturation is related to hypoaeration which results in V/Q mismatching.  I stressed to him the importance of aggressive weight loss.

## 2013-11-28 ENCOUNTER — Telehealth: Payer: Self-pay | Admitting: Pulmonary Disease

## 2013-11-28 ENCOUNTER — Other Ambulatory Visit: Payer: Self-pay | Admitting: Pulmonary Disease

## 2013-11-28 DIAGNOSIS — G4734 Idiopathic sleep related nonobstructive alveolar hypoventilation: Secondary | ICD-10-CM

## 2013-11-28 NOTE — Telephone Encounter (Signed)
Let pt know that his oxygen study showed a drop in his level.  Orders have been sent to dme for nocturnal oxygen.

## 2013-11-28 NOTE — Telephone Encounter (Signed)
Download received and placed in [place] look at. Please advise thanks 

## 2013-11-28 NOTE — Telephone Encounter (Signed)
I have not received pt ONO results. [facility] and she will fax to me in triage. Will await fax

## 2013-11-29 NOTE — Telephone Encounter (Signed)
Pt informed of ONO results per Dr Gwenette Greet.

## 2014-03-29 ENCOUNTER — Other Ambulatory Visit: Payer: Self-pay | Admitting: Radiation Oncology

## 2014-04-03 ENCOUNTER — Encounter: Payer: Self-pay | Admitting: Pulmonary Disease

## 2014-06-19 ENCOUNTER — Other Ambulatory Visit: Payer: Self-pay | Admitting: Radiation Oncology

## 2015-04-04 ENCOUNTER — Other Ambulatory Visit: Payer: Self-pay | Admitting: Internal Medicine

## 2015-04-04 DIAGNOSIS — D649 Anemia, unspecified: Secondary | ICD-10-CM

## 2015-04-04 DIAGNOSIS — N189 Chronic kidney disease, unspecified: Secondary | ICD-10-CM

## 2015-04-04 DIAGNOSIS — I159 Secondary hypertension, unspecified: Secondary | ICD-10-CM

## 2015-04-09 ENCOUNTER — Ambulatory Visit
Admission: RE | Admit: 2015-04-09 | Discharge: 2015-04-09 | Disposition: A | Discharge status: Home / Self Care | Payer: Medicare Other | Source: Ambulatory Visit | Attending: Internal Medicine | Admitting: Internal Medicine

## 2015-04-09 ENCOUNTER — Encounter: Payer: Self-pay | Admitting: Oncology

## 2015-04-09 DIAGNOSIS — I159 Secondary hypertension, unspecified: Secondary | ICD-10-CM

## 2015-04-09 DIAGNOSIS — N189 Chronic kidney disease, unspecified: Secondary | ICD-10-CM

## 2015-04-09 DIAGNOSIS — D649 Anemia, unspecified: Secondary | ICD-10-CM

## 2015-04-09 IMAGING — US US RENAL
1 series · 13 of 25 positions shown · non-contrast
Comparison: CT scan of the abdomen and pelvis of [DATE]

CLINICAL DATA: Chronic renal insufficiency, hypertension, anemia.
No pain or hematuria

EXAM:
RENAL / URINARY TRACT ULTRASOUND COMPLETE

[Series 1: us renal · 0.28mm/px · 13 of 36 slices shown]
[im 1/36]
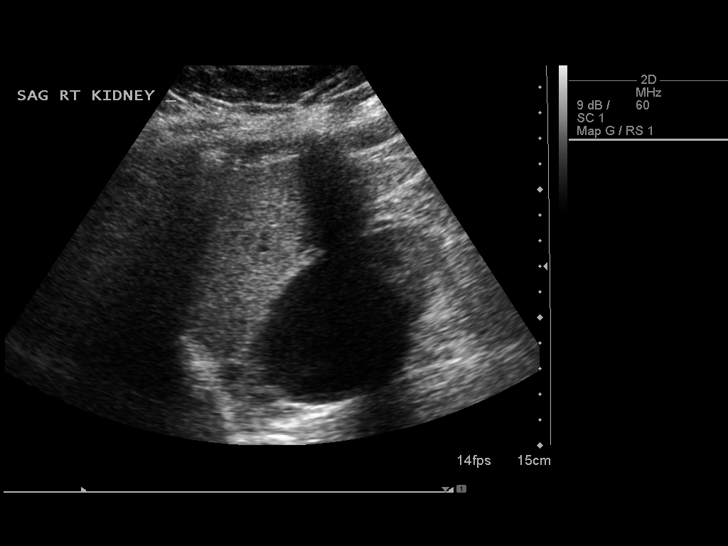
[im 3/36]
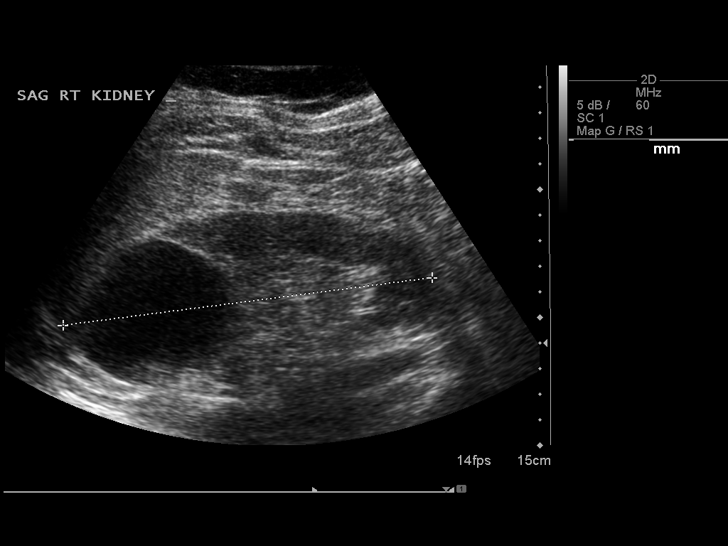
[im 6/36]
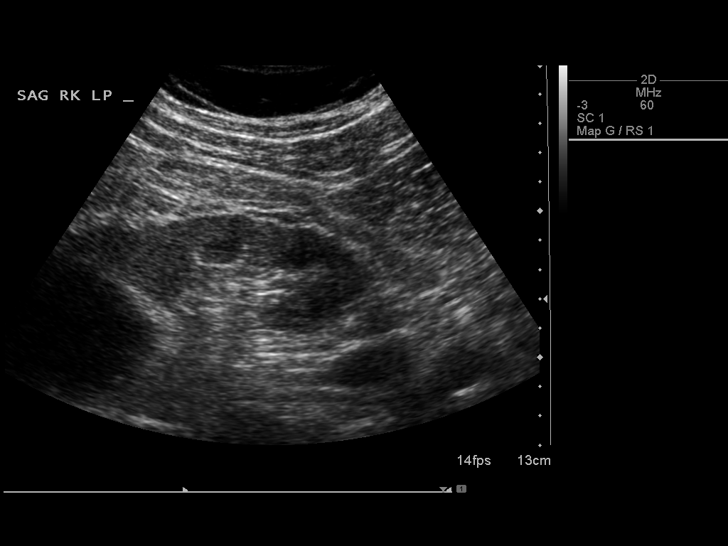
[im 9/36]
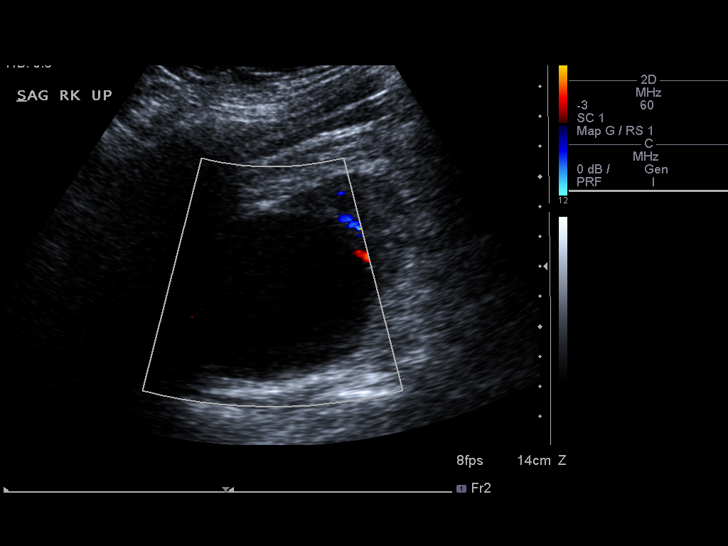
[im 12/36]
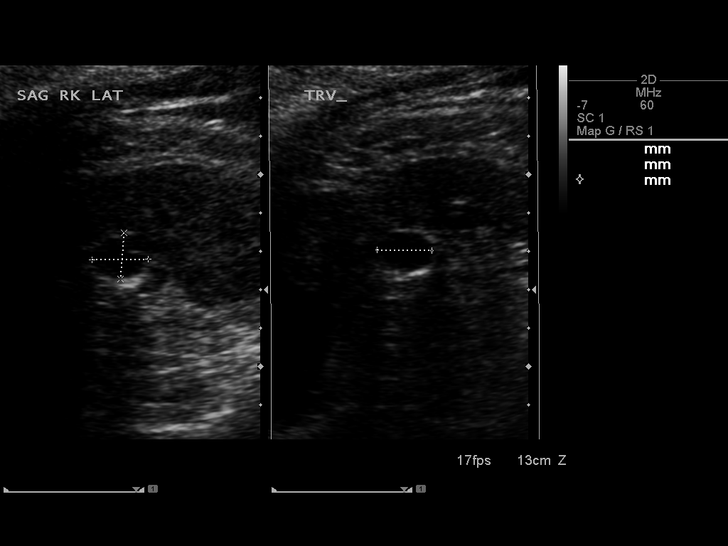
[im 15/36]
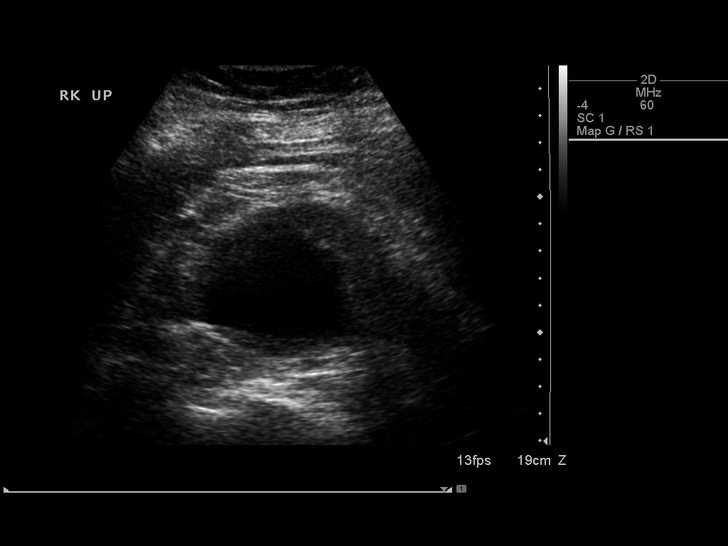
[im 18/36]
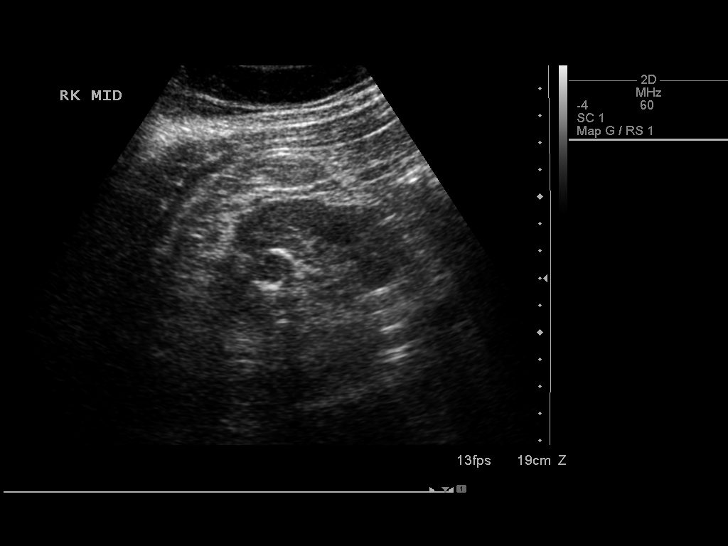
[im 21/36]
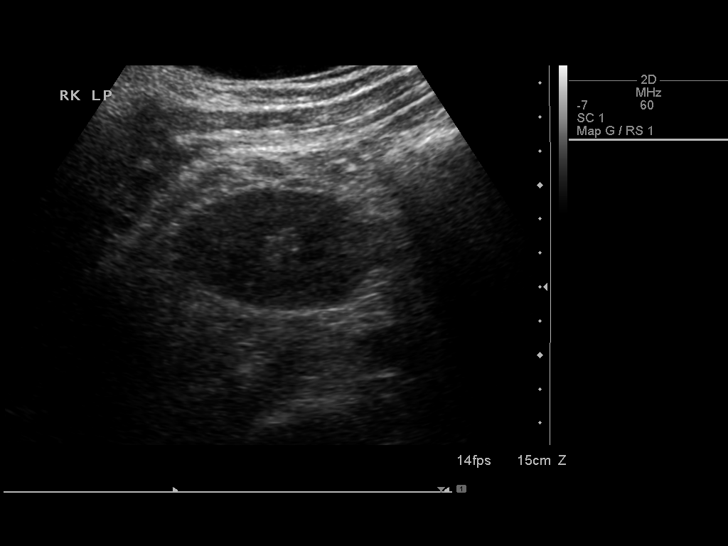
[im 24/36]
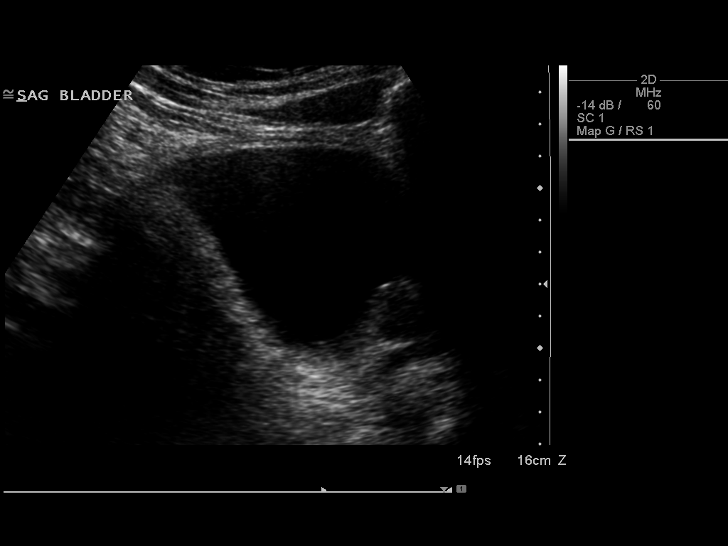
[im 27/36]
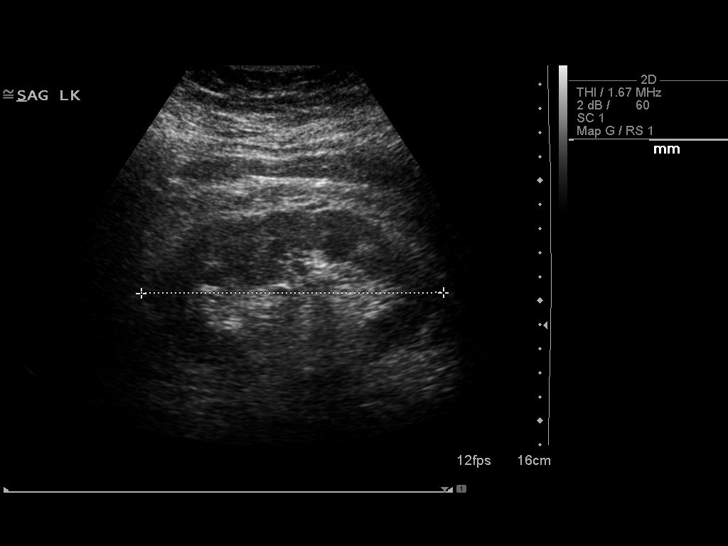
[im 30/36]
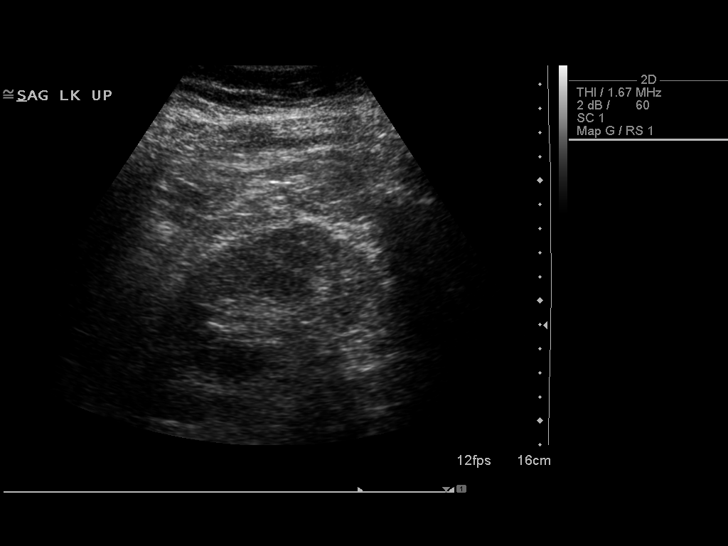
[im 33/36]
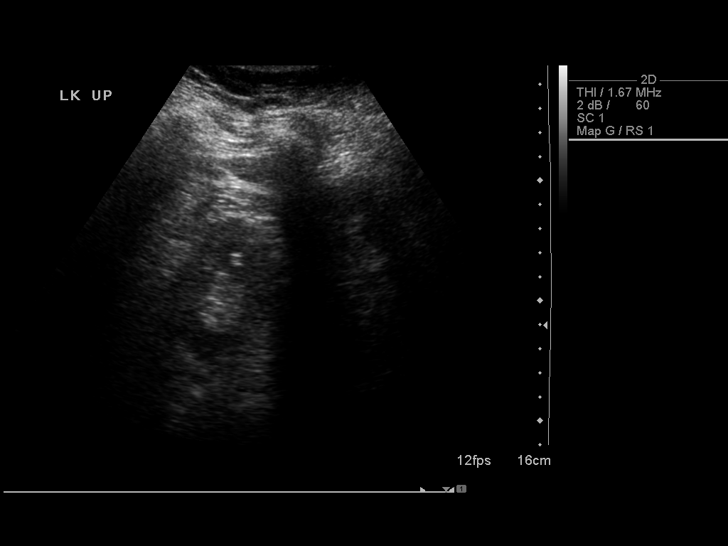
[im 36/36]
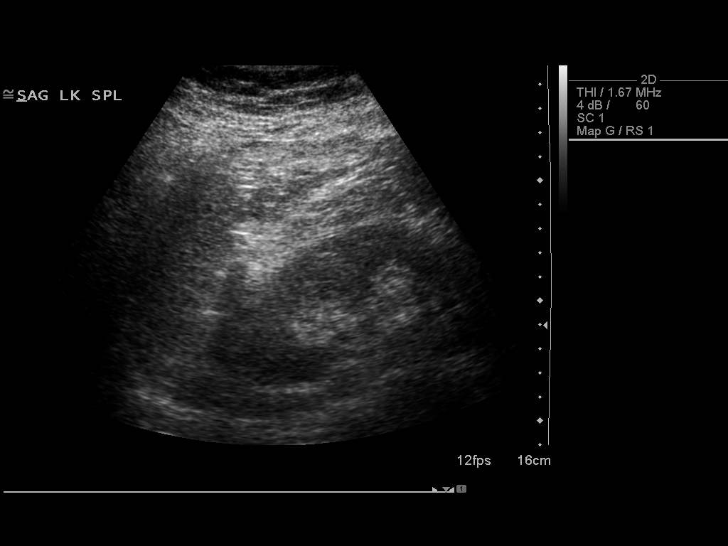

[13 of 25 positions shown; findings below may reference images not displayed]

FINDINGS: Right Kidney:

Length: 15.2 cm. The renal cortical echotexture is normal. There is
a dominant upper pole cyst measuring 6.3 x 5.7 by 7.2 cm. There is a
midpole cystic structure with echogenic walls measuring 1.5 x 1.2 x
1.4 cm. This likely corresponds to curvilinear calcification
observed on the previous CT scan. There is a probable 8 mm diameter
lower pole cortical cyst which appears stable.

Left Kidney:

Length: 13.2 cm. Echogenicity within normal limits. No mass or
hydronephrosis visualized.

Bladder:

Appears normal for degree of bladder distention.
IMPRESSION: 1. Complex cystic structure in the midpole of the right kidney
exhibiting some mural calcification measuring 1.5 x 1.2 x 1.4 cm. It
does not appear to have changed significantly since the previous
study.
2. Dominant upper pole simple appearing cyst measuring up to 7.2 cm
in diameter which has increased in size since the previous study.
Stable 8 mm diameter lower pole cortical cyst.
3. The left kidney and urinary bladder are unremarkable.

## 2015-04-13 ENCOUNTER — Ambulatory Visit (HOSPITAL_BASED_OUTPATIENT_CLINIC_OR_DEPARTMENT_OTHER): Payer: Medicare Other

## 2015-04-13 ENCOUNTER — Telehealth: Payer: Self-pay | Admitting: Oncology

## 2015-04-13 ENCOUNTER — Ambulatory Visit (HOSPITAL_BASED_OUTPATIENT_CLINIC_OR_DEPARTMENT_OTHER): Payer: Medicare Other | Admitting: Oncology

## 2015-04-13 VITALS — BP 129/67 | HR 85 | Temp 97.7°F | Resp 18 | Ht 74.0 in

## 2015-04-13 DIAGNOSIS — N189 Chronic kidney disease, unspecified: Secondary | ICD-10-CM | POA: Diagnosis not present

## 2015-04-13 DIAGNOSIS — Z8546 Personal history of malignant neoplasm of prostate: Secondary | ICD-10-CM

## 2015-04-13 DIAGNOSIS — D631 Anemia in chronic kidney disease: Secondary | ICD-10-CM

## 2015-04-13 LAB — CBC WITH DIFFERENTIAL/PLATELET
BASO%: 0.2 % (ref 0.0–2.0)
BASOS ABS: 0 10*3/uL (ref 0.0–0.1)
EOS%: 5.2 % (ref 0.0–7.0)
Eosinophils Absolute: 0.3 10*3/uL (ref 0.0–0.5)
HCT: 26.3 % — ABNORMAL LOW (ref 38.4–49.9)
HEMOGLOBIN: 8.4 g/dL — AB (ref 13.0–17.1)
LYMPH%: 15.6 % (ref 14.0–49.0)
MCH: 31.5 pg (ref 27.2–33.4)
MCHC: 31.9 g/dL — ABNORMAL LOW (ref 32.0–36.0)
MCV: 98.5 fL — ABNORMAL HIGH (ref 79.3–98.0)
MONO#: 0.5 10*3/uL (ref 0.1–0.9)
MONO%: 9.1 % (ref 0.0–14.0)
NEUT%: 69.9 % (ref 39.0–75.0)
NEUTROS ABS: 4 10*3/uL (ref 1.5–6.5)
Platelets: 209 10*3/uL (ref 140–400)
RBC: 2.67 10*6/uL — ABNORMAL LOW (ref 4.20–5.82)
RDW: 14.5 % (ref 11.0–14.6)
WBC: 5.7 10*3/uL (ref 4.0–10.3)
lymph#: 0.9 10*3/uL (ref 0.9–3.3)

## 2015-04-13 LAB — FERRITIN: FERRITIN: 46 ng/mL (ref 22–316)

## 2015-04-13 LAB — COMPREHENSIVE METABOLIC PANEL
ALT: 13 U/L (ref 0–55)
AST: 21 U/L (ref 5–34)
Albumin: 3.2 g/dL — ABNORMAL LOW (ref 3.5–5.0)
Alkaline Phosphatase: 46 U/L (ref 40–150)
Anion Gap: 7 mEq/L (ref 3–11)
BUN: 45.1 mg/dL — AB (ref 7.0–26.0)
CALCIUM: 9.4 mg/dL (ref 8.4–10.4)
CHLORIDE: 108 meq/L (ref 98–109)
CO2: 24 mEq/L (ref 22–29)
Creatinine: 2.9 mg/dL — ABNORMAL HIGH (ref 0.7–1.3)
EGFR: 24 mL/min/{1.73_m2} — AB (ref >90)
Glucose: 104 mg/dl (ref 70–140)
POTASSIUM: 4.8 meq/L (ref 3.5–5.1)
SODIUM: 139 meq/L (ref 136–145)
Total Bilirubin: <0.3 mg/dL (ref 0.20–1.20)
Total Protein: 7.1 g/dL (ref 6.4–8.3)

## 2015-04-13 LAB — IRON AND TIBC
%SAT: 10 % — ABNORMAL LOW (ref 20–55)
IRON: 35 ug/dL — AB (ref 42–163)
TIBC: 349 ug/dL (ref 202–409)
UIBC: 314 ug/dL (ref 117–376)

## 2015-04-13 LAB — CHCC SMEAR

## 2015-04-13 NOTE — Consult Note (Signed)
Reason for Referral: Anemia.   HPI: 70 year old gentleman currently of [place] where he lived the majority of his life. He is a pleasant gentleman diagnosed with prostate cancer in 2013 he had a Gleason score 3+3 = 6 as well as a another pattern 3+4 = 7. His PSA was 5.66 with staging of T1c. He received definitive therapy with external beam/IMRT completed in December 2013. He also has history of hypertension and hyperlipidemia as well as renal insufficiency. He have noted recently is slight fatigue and occasional exertional dyspnea and his evaluation by his primary care provider; Dr. Dagmar Hait showed his hemoglobin to be 9.2 with MCV 101.8. His platelet count was normal at 236 and his total white cell count is normal at 6.9. It his iron studies all within normal range including serum iron of 69 and saturation of 23%. No ferritin noted at this time. His creatinine is elevated at 2.1 with a creatinine clearance about 38 mL/m. He elected also within normal range including total protein, albumin and electrolytes. Based on these findings he was referred to me for evaluation. Clinically, he reported symptoms of fatigue, tiredness and occasional exertional dyspnea. He denied any bleeding such as hematochezia, melena. He denied any epistaxis. He is up to date on his colonoscopy. He is able to attend to most activities of daily living including driving among other things. He is not debilitated by his anemia.  He does not report any headaches, blurry vision, syncope or seizures. He does not report any fevers, chills, sweats or weight loss. Does not report any chest pain, palpitation, orthopnea or leg edema. He does not report any cough, wheezing or hemoptysis. He does not report any nausea, vomiting, abdominal pain, hematochezia or melena. He does not report any frequency, urgency or hesitancy. He does not report any skeletal complaints of arthralgias or myalgias. Remaining review of systems unremarkable.   Past Medical  History  Diagnosis Date  . Prostate cancer 08/14/11    Adenocarcinoma,gleason:3+3=6,& 3+4=7,PSA=5.66  . Anemia     hx iron deficiency  . Hypertension   . Depression   . BPH with obstruction/lower urinary tract symptoms   . ED (erectile dysfunction)   . Night sweats   . Back pain   . Heartburn   . Ulcer     peptic ulcer hx  . Hypercholesterolemia   . Arthritis     gout  . Anxiety   . History of radiation therapy 11/03/11-12/29/11    prostate  :  Past Surgical History  Procedure Laterality Date  . Prostate biopsy  08/14/11    Adenocarcinoma/volume=58.51cc,gleason=3+3=6 & 3+4=7  . Colon polyps bx  11/24/06    colon,transverse and rectosigmoid polyps:tubular adenomas and hyperplastic polyps,no high grade dysplasia or malignancy   . Gastric bx  11/24/06    chronic active gastritis,with metaplasia and focal changaes of xanthelasma  . Duodenal bx  11/24/06    benign  . Tonsillectomy      70 years old  . Insertion prostate radiation seed  12-29-11  :   Current outpatient prescriptions:  .  allopurinol (ZYLOPRIM) 100 MG tablet, Take 100 mg by mouth as needed. , Disp: , Rfl:  .  buPROPion (WELLBUTRIN) 100 MG tablet, Take 150 mg by mouth daily. , Disp: , Rfl:  .  diazepam (VALIUM) 5 MG tablet, Take 5 mg by mouth at bedtime., Disp: , Rfl:  .  furosemide (LASIX) 80 MG tablet, Take 80 mg by mouth. Taking 1/2 tablet, Disp: , Rfl:  .  gabapentin (NEURONTIN) 800 MG tablet, Take 800 mg by mouth at bedtime. , Disp: , Rfl:  .  LOSARTAN POTASSIUM-HCTZ PO, Take by mouth daily. , Disp: , Rfl:  .  Melatonin 10 MG TABS, Take 1 tablet by mouth daily., Disp: , Rfl:  .  Nebivolol HCl (BYSTOLIC) 20 MG TABS, Take 20 mg by mouth daily. , Disp: , Rfl:  .  OMEPRAZOLE PO, Take 20 mg by mouth daily. , Disp: , Rfl:  .  tamsulosin (FLOMAX) 0.4 MG CAPS capsule, TAKE 1 CAPSULE BY MOUTH DAILY, Disp: 30 capsule, Rfl: 2 .  VALERIAN PO, Take 1 tablet by mouth at bedtime., Disp: , Rfl:  .  valsartan (DIOVAN) 320  MG tablet, Take 320 mg by mouth daily., Disp: , Rfl:  .  VERAPAMIL HCL ER, CO, PO, Take 240 mg by mouth daily., Disp: , Rfl: :  No Known Allergies:  Family History  Problem Relation Age of Onset  . Colon cancer Neg Hx   . Alcohol abuse Brother 50  . Alcohol abuse Father   :  Social History   Social History  . Marital Status: Married    Spouse Name: [name]  . Number of Children: 3  . Years of Education: 12   Occupational History  . retired Public house manager   Social History Main Topics  . Smoking status: Former Smoker -- 1.50 packs/day for 30 years    Types: Cigarettes    Quit date: 04/13/2000  . Smokeless tobacco: Never Used  . Alcohol Use: 3.6 oz/week    6 Cans of beer per week     Comment: 1-2 beers every other night  . Drug Use: No  . Sexual Activity: Not Currently   Other Topics Concern  . Not on file   Social History Narrative   Patient is married [name]) and lives at home with his wife and one child.   Patient has three children.   Patient is retired.   Patient has a high school education.   Patient is ambi-dextrous.   Patient drinks two cups of coffee about three times a week.   :  Pertinent items are noted in HPI.  Exam: Blood pressure 129/67, pulse 85, temperature 97.7 F (36.5 C), temperature source Oral, resp. rate 18, height 6' 2"  (1.88 m), SpO2 95 %.  ECOG 1 General appearance: alert and cooperative.Appeared without distress. Head: Normocephalic, without obvious abnormality Nose: Nares normal. Septum midline. Mucosa normal. No drainage or sinus tenderness. Throat: No oral ulcers or lesions. No thrush noted. Neck: no adenopathy. Without hepatomegaly. Back: negative Resp: clear to auscultation bilaterally no rhonchi, wheezes or dullness to percussion. Chest wall: no tenderness Cardio: regular rate and rhythm, S1, S2 normal, no murmur, click, rub or gallop GI: soft, non-tender; bowel sounds normal; no masses,  no organomegaly Extremities:  extremities normal, atraumatic, no cyanosis or edema Pulses: 2+ and symmetric Skin: Skin color, texture, turgor normal. No rashes or lesions Lymph nodes: Cervical, supraclavicular, and axillary nodes normal.  CBC    Component Value Date/Time   HGB 14.6 11/08/2012 1649   HCT 43.0 11/08/2012 1649      Chemistry      Component Value Date/Time   NA 141 11/08/2012 1649   K 4.1 11/08/2012 1649   CL 105 11/08/2012 1649   BUN 25* 11/08/2012 1649   CREATININE 1.60* 11/08/2012 1649   No results found for: CALCIUM, ALKPHOS, AST, ALT, BILITOT     US Renal  04/09/2015  CLINICAL DATA:  Chronic renal insufficiency, hypertension, anemia. No pain or hematuria EXAM: RENAL / URINARY TRACT ULTRASOUND COMPLETE COMPARISON:  CT scan of the abdomen and pelvis of Jun 07, 2014 FINDINGS: Right Kidney: Length: 15.2 cm. The renal cortical echotexture is normal. There is a dominant upper pole cyst measuring 6.3 x 5.7 by 7.2 cm. There is a midpole cystic structure with echogenic walls measuring 1.5 x 1.2 x 1.4 cm. This likely corresponds to curvilinear calcification observed on the previous CT scan. There is a probable 8 mm diameter lower pole cortical cyst which appears stable. Left Kidney: Length: 13.2 cm. Echogenicity within normal limits. No mass or hydronephrosis visualized. Bladder: Appears normal for degree of bladder distention. IMPRESSION: 1. Complex cystic structure in the midpole of the right kidney exhibiting some mural calcification measuring 1.5 x 1.2 x 1.4 cm. It does not appear to have changed significantly since the previous study. 2. Dominant upper pole simple appearing cyst measuring up to 7.2 cm in diameter which has increased in size since the previous study. Stable 8 mm diameter lower pole cortical cyst. 3. The left kidney and urinary bladder are unremarkable. Electronically Signed   By: David  Martinique M.D.   On: 04/09/2015 14:06    Assessment and Plan:   70 year old gentleman with the following  issues:  1. Anemia diagnosed in March 2017 after presenting with a hemoglobin of 9.2 and slightly elevated MCV. His hemoglobin today is 8.4 with an MCV of 98.5 which is slightly elevated. The differential diagnosis was discussed today with the patient.  Anemia of renal disease is a major contributing factor. He has creatinine clearance of less than 40 mL/m which is certainly contributing. Other causes which include anemia of bone marrow suppression related to previous radiation, anemia of chronic disease, as well as vitamin deficiencies such as M76, folic acid. Other conditions such as MDS, plasma cell disorder are also a possibility but less likely.  To evaluate this finding completely, I will obtain a serum protein electrophoresis, vitamin H20, folic acid, and erythropoietin level, peripheral smear as well as a repeat creatinine. Depending on these findings will dictate the next approach. A bone marrow biopsy could be a possibility as the likelihood of a plasma cell disorder or MDS is high.  If we are dealing with anemia of renal failure, growth factor support in the form of Aranesp will be necessary. Risks and benefits of this approach was discussed and is agreeable to proceed if this is the case. We'll arrange to start Aranesp if his erythropoietin is inappropriately low.  All his questions answered and he satisfaction.  2. Prostate cancer: Appears to be in remission at this time followed by urology.  3. Renal insufficiency: Likely related to long-standing hypertension and less likely a plasma cell disorder.  4. Follow-up: Will be in the next few weeks to discuss the results of his workup.

## 2015-04-13 NOTE — Progress Notes (Signed)
Please see consult note.  

## 2015-04-13 NOTE — Telephone Encounter (Signed)
Patient sent back to lab and given avs report and appointments for April.

## 2015-04-14 LAB — ERYTHROPOIETIN: Erythropoietin: 46.2 m[IU]/mL — ABNORMAL HIGH (ref 2.6–18.5)

## 2015-04-14 LAB — FOLATE: Folate: 14.3 ng/mL (ref >3.0)

## 2015-04-14 LAB — VITAMIN B12: Vitamin B12: 583 pg/mL (ref 211–946)

## 2015-04-17 LAB — MULTIPLE MYELOMA PANEL, SERUM
ALPHA 1: 0.3 g/dL (ref 0.0–0.4)
ALPHA2 GLOB SERPL ELPH-MCNC: 0.7 g/dL (ref 0.4–1.0)
Albumin SerPl Elph-Mcnc: 3.1 g/dL (ref 2.9–4.4)
Albumin/Glob SerPl: 1 (ref 0.7–1.7)
B-Globulin SerPl Elph-Mcnc: 1.2 g/dL (ref 0.7–1.3)
GAMMA GLOB SERPL ELPH-MCNC: 1.1 g/dL (ref 0.4–1.8)
GLOBULIN, TOTAL: 3.3 g/dL (ref 2.2–3.9)
IGA/IMMUNOGLOBULIN A, SERUM: 346 mg/dL (ref 61–437)
IGM (IMMUNOGLOBIN M), SRM: 30 mg/dL (ref 20–172)
IgG, Qn, Serum: 1157 mg/dL (ref 700–1600)
M Protein SerPl Elph-Mcnc: 0.3 g/dL — ABNORMAL HIGH
Total Protein: 6.4 g/dL (ref 6.0–8.5)

## 2015-04-30 ENCOUNTER — Telehealth: Payer: Self-pay | Admitting: Oncology

## 2015-04-30 NOTE — Telephone Encounter (Signed)
Call day - lab/fu/inj moved to 12 pm on 4/21. Spoke with patient he is aware.

## 2015-05-03 ENCOUNTER — Other Ambulatory Visit: Payer: Self-pay | Admitting: *Deleted

## 2015-05-03 DIAGNOSIS — D509 Iron deficiency anemia, unspecified: Secondary | ICD-10-CM

## 2015-05-04 ENCOUNTER — Ambulatory Visit (HOSPITAL_BASED_OUTPATIENT_CLINIC_OR_DEPARTMENT_OTHER): Payer: Medicare Other

## 2015-05-04 ENCOUNTER — Ambulatory Visit (HOSPITAL_COMMUNITY)
Admission: RE | Admit: 2015-05-04 | Discharge: 2015-05-04 | Disposition: A | Discharge status: Home / Self Care | Payer: Medicare Other | Source: Ambulatory Visit | Attending: Oncology | Admitting: Oncology

## 2015-05-04 ENCOUNTER — Other Ambulatory Visit: Payer: Self-pay | Admitting: Medical Oncology

## 2015-05-04 ENCOUNTER — Ambulatory Visit: Payer: Medicare Other

## 2015-05-04 ENCOUNTER — Ambulatory Visit (HOSPITAL_BASED_OUTPATIENT_CLINIC_OR_DEPARTMENT_OTHER): Payer: Medicare Other | Admitting: Oncology

## 2015-05-04 ENCOUNTER — Telehealth: Payer: Self-pay | Admitting: Oncology

## 2015-05-04 ENCOUNTER — Other Ambulatory Visit (HOSPITAL_BASED_OUTPATIENT_CLINIC_OR_DEPARTMENT_OTHER): Payer: Medicare Other

## 2015-05-04 VITALS — BP 126/59 | HR 78 | Temp 98.2°F | Resp 18 | Ht 74.0 in | Wt 281.9 lb

## 2015-05-04 VITALS — BP 107/56 | HR 63 | Temp 97.9°F | Resp 18

## 2015-05-04 DIAGNOSIS — D509 Iron deficiency anemia, unspecified: Secondary | ICD-10-CM | POA: Diagnosis not present

## 2015-05-04 DIAGNOSIS — D631 Anemia in chronic kidney disease: Secondary | ICD-10-CM | POA: Diagnosis not present

## 2015-05-04 DIAGNOSIS — N189 Chronic kidney disease, unspecified: Secondary | ICD-10-CM | POA: Diagnosis present

## 2015-05-04 DIAGNOSIS — Z8546 Personal history of malignant neoplasm of prostate: Secondary | ICD-10-CM | POA: Diagnosis not present

## 2015-05-04 LAB — CBC WITH DIFFERENTIAL/PLATELET
BASO%: 0.2 % (ref 0.0–2.0)
Basophils Absolute: 0 10*3/uL (ref 0.0–0.1)
EOS%: 4.5 % (ref 0.0–7.0)
Eosinophils Absolute: 0.3 10*3/uL (ref 0.0–0.5)
HEMATOCRIT: 19.3 % — AB (ref 38.4–49.9)
HGB: 6 g/dL — CL (ref 13.0–17.1)
LYMPH#: 1.1 10*3/uL (ref 0.9–3.3)
LYMPH%: 17.9 % (ref 14.0–49.0)
MCH: 30.3 pg (ref 27.2–33.4)
MCHC: 31.1 g/dL — ABNORMAL LOW (ref 32.0–36.0)
MCV: 97.5 fL (ref 79.3–98.0)
MONO#: 0.5 10*3/uL (ref 0.1–0.9)
MONO%: 8.5 % (ref 0.0–14.0)
NEUT%: 68.9 % (ref 39.0–75.0)
NEUTROS ABS: 4.3 10*3/uL (ref 1.5–6.5)
PLATELETS: 218 10*3/uL (ref 140–400)
RBC: 1.98 10*6/uL — AB (ref 4.20–5.82)
RDW: 14.9 % — ABNORMAL HIGH (ref 11.0–14.6)
WBC: 6.2 10*3/uL (ref 4.0–10.3)

## 2015-05-04 LAB — PREPARE RBC (CROSSMATCH)

## 2015-05-04 LAB — ABO/RH: ABO/RH(D): O POS

## 2015-05-04 MED ORDER — ACETAMINOPHEN 325 MG PO TABS
ORAL_TABLET | ORAL | Status: AC
  Filled 2015-05-04: qty 2

## 2015-05-04 MED ORDER — DIPHENHYDRAMINE HCL 25 MG PO CAPS
ORAL_CAPSULE | ORAL | Status: AC
  Filled 2015-05-04: qty 1

## 2015-05-04 MED ORDER — ACETAMINOPHEN 325 MG PO TABS
650.0000 mg | ORAL_TABLET | Freq: Once | ORAL | Status: AC
  Administered 2015-05-04: 650 mg via ORAL

## 2015-05-04 MED ORDER — DIPHENHYDRAMINE HCL 25 MG PO CAPS
25.0000 mg | ORAL_CAPSULE | Freq: Once | ORAL | Status: AC
  Administered 2015-05-04: 25 mg via ORAL

## 2015-05-04 MED ORDER — SODIUM CHLORIDE 0.9 % IV SOLN
250.0000 mL | Freq: Once | INTRAVENOUS | Status: AC
  Administered 2015-05-04: 250 mL via INTRAVENOUS

## 2015-05-04 NOTE — Patient Instructions (Signed)

## 2015-05-04 NOTE — Telephone Encounter (Signed)
Gave and printed appt sched and avs for pt for April and May °

## 2015-05-04 NOTE — Progress Notes (Signed)
Hematology and Oncology Follow Up Visit  Jacob [name] [id number] 06-23-1945 70 y.o. 05/04/2015 12:44 PM Jacob Cedar, MD   Principle Diagnosis: 70 year old gentleman with normocytic, normochromic anemia diagnosed March 2017. He presented with a hemoglobin of 9.2 and worsening renal insufficiency with a creatinine of 2.9 creatinine clearance 44 mL/m.  He also was diagnosed with prostate cancer in 2013 with a Gleason score 3+4 = 7 and a PSA 5.66 stage TIc.   Prior Therapy: He is status post radiation therapy for definitive treatment for his prostate cancer. Therapy concluded in December 2013.  Current therapy: Supportive measures including packed red cell transfusion and IV iron.  Interim History: Jacob [name] presents today for a follow-up visit. Since the last visit, he is reporting more fatigue, tiredness and decline in his energy. He also reported exertional dyspnea. He did not report any chest pain or angina with exertion. He is still able to drive and attends to activities of daily living. He denied any hematochezia or melena. He denied any change in his bowel habits. Did not any hematuria or hemoptysis.  He does not report any headaches, blurry vision, syncope or seizures. He does not report any fevers, chills, sweats or weight loss. Does not report any chest pain, palpitation, orthopnea or leg edema. He does not report any cough, wheezing or hemoptysis. He does not report any nausea, vomiting, abdominal pain, hematochezia or melena. He does not report any frequency, urgency or hesitancy. He does not report any skeletal complaints of arthralgias or myalgias. Remaining review of systems unremarkable.   Medications: I have reviewed the patient's current medications.  Current Outpatient Prescriptions  Medication Sig Dispense Refill  . allopurinol (ZYLOPRIM) 100 MG tablet Take 100 mg by mouth as needed.     Marland Kitchen buPROPion (WELLBUTRIN) 100 MG tablet Take 150 mg  by mouth daily.     . diazepam (VALIUM) 5 MG tablet Take 5 mg by mouth at bedtime.    . furosemide (LASIX) 80 MG tablet Take 80 mg by mouth. Taking 1/2 tablet    . gabapentin (NEURONTIN) 800 MG tablet Take 800 mg by mouth at bedtime.     Marland Kitchen LOSARTAN POTASSIUM-HCTZ PO Take by mouth daily.     . Melatonin 10 MG TABS Take 1 tablet by mouth daily.    . Nebivolol HCl (BYSTOLIC) 20 MG TABS Take 20 mg by mouth daily.     Marland Kitchen OMEPRAZOLE PO Take 20 mg by mouth daily.     . tamsulosin (FLOMAX) 0.4 MG CAPS capsule TAKE 1 CAPSULE BY MOUTH DAILY 30 capsule 2  . VALERIAN PO Take 1 tablet by mouth at bedtime.    . valsartan (DIOVAN) 320 MG tablet Take 320 mg by mouth daily.    . Verapamil HCl CR 300 MG CP24 Take 1 tablet by mouth daily.     No current facility-administered medications for this visit.     Allergies: No Known Allergies  Past Medical History, Surgical history, Social history, and Family History were reviewed and updated.   Physical Exam: Blood pressure 126/59, pulse 78, temperature 98.2 F (36.8 C), temperature source Oral, resp. rate 18, height 6\' 2"  (1.88 m), weight 281 lb 14.4 oz (127.869 kg), SpO2 100 %. ECOG: 1 General appearance: alert and cooperative Head: Normocephalic, without obvious abnormality Neck: no adenopathy Lymph nodes: Cervical, supraclavicular, and axillary nodes normal. Heart:regular rate and rhythm, S1, S2 normal, no murmur, click, rub or gallop Lung:chest clear, no wheezing, rales, normal symmetric air entry Abdomin:  soft, non-tender, without masses or organomegaly EXT:no erythema, induration, or nodules   Lab Results: Lab Results  Component Value Date   WBC 6.2 05/04/2015   HGB 6.0* 05/04/2015   HCT 19.3* 05/04/2015   MCV 97.5 05/04/2015   PLT 218 05/04/2015     Chemistry      Component Value Date/Time   NA 139 04/13/2015 1128   NA 141 11/08/2012 1649   K 4.8 04/13/2015 1128   K 4.1 11/08/2012 1649   CL 105 11/08/2012 1649   CO2 24 04/13/2015  1128   BUN 45.1* 04/13/2015 1128   BUN 25* 11/08/2012 1649   CREATININE 2.9* 04/13/2015 1128   CREATININE 1.60* 11/08/2012 1649      Component Value Date/Time   CALCIUM 9.4 04/13/2015 1128   ALKPHOS 46 04/13/2015 1128   AST 21 04/13/2015 1128   ALT 13 04/13/2015 1128   BILITOT <0.30 04/13/2015 1128      Results for Jacob [name], Jacob [name] (MRN [id number]) as of 05/04/2015 12:25  Ref. Range 04/13/2015 11:28  IFE 1 Unknown Comment  Globulin, Total Latest Ref Range: 2.2-3.9 g/dL 3.3  B-Globulin SerPl Elph-Mcnc Latest Ref Range: 0.7-1.3 g/dL 1.2  IgG (Immunoglobin G), Serum Latest Ref Range: [phone] mg/dL 1,157  IgM, Qn, Serum Latest Ref Range: 20-172 mg/dL 30  M Protein SerPl Elph-Mcnc Latest Ref Range: Not Observed g/dL 0.3 (H)   Results for Jacob [name], Jacob [name] (MRN [id number]) as of 05/04/2015 12:25  Ref. Range 04/13/2015 11:28  Iron Latest Ref Range: 42-163 ug/dL 35 (L)  UIBC Latest Ref Range: 117-376 ug/dL 314  TIBC Latest Ref Range: 202-409 ug/dL 349  %SAT Latest Ref Range: 20-55 % 10 (L)  Ferritin Latest Ref Range: 22-316 ng/ml 46  Folate Latest Ref Range: >3.0 ng/mL 14.3  Results for Jacob [name], Jacob [name] (MRN [id number]) as of 05/04/2015 12:25  Ref. Range 04/13/2015 11:28  Erythropoietin Latest Ref Range: 2.6-18.5 mIU/mL 46.2 (H)     Impression and Plan:  70 year old woman with the following issues:  1. Normocytic, normochromic anemia presented initially with a hemoglobin of 9.2 now has drifted down to 6.0. His MCV is normal at 97 but does have an elevated RDW.   His workup so far revealed an M spike on his serum protein electrophoresis of 0.3 g/dL. 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 His IgG level is normal. He has normal quantitative immunoglobulins otherwise. His kidney function is abnormal with a creatinine clearance of around 24 mL/m. His erythropoietin 46.2 which is inappropriate for his level of anemia. His iron studies do  show iron deficiency with iron level of 35, saturation of 10%. Ferritin is 46.  From a management standpoint, he is symptomatic from his anemia so the first up will be to proceed with transfusion. Risks and benefits of transfusion 2 units of packed red cells were reviewed and he is agreeable.  The second step would be to replace his depleted iron stores with intravenous iron. We will schedule that in the next week or so to replace his iron stores in preparation for possible growth factor support.  I do not think his anemia is related to a plasma cell disorder but his abnormal SPEP necessitate a bone marrow examination to rule out infiltrative plasma cell disorder in the bone marrow. His M spike is rather low and likely his anemia is related to renal insufficiency. However, it is important step in determining the etiology of his anemia to rule out plasma cell disorder as well as  myelodysplasia. The risks and benefits of bone marrow examination was discussed and is agreeable to proceed. We will schedule that in the near future.  Further management of his anemia will be depending on the etiology. If we're dealing with anemia of renal disease, grossly factor support in the form of Procrit or Aranesp would be appropriate after adequate replacement of his iron stores. This will be started in the future.  2. Prostate cancer: He is status post radiation therapy which can contribute to bone marrow suppression and contribute to his anemia. Infiltrative prostate cancer into the bone marrow is extremely unlikely at this time.  3. Follow-up in the next few weeks after his workup is complete.   Zola Button, MD 4/21/201712:44 PM

## 2015-05-05 ENCOUNTER — Ambulatory Visit (HOSPITAL_BASED_OUTPATIENT_CLINIC_OR_DEPARTMENT_OTHER): Payer: Medicare Other

## 2015-05-05 VITALS — BP 128/65 | HR 56 | Temp 97.5°F | Resp 18

## 2015-05-05 DIAGNOSIS — N189 Chronic kidney disease, unspecified: Secondary | ICD-10-CM

## 2015-05-05 DIAGNOSIS — D631 Anemia in chronic kidney disease: Secondary | ICD-10-CM

## 2015-05-05 MED ORDER — SODIUM CHLORIDE 0.9 % IV SOLN
250.0000 mL | Freq: Once | INTRAVENOUS | Status: AC
  Administered 2015-05-05: 250 mL via INTRAVENOUS

## 2015-05-05 MED ORDER — ACETAMINOPHEN 325 MG PO TABS
650.0000 mg | ORAL_TABLET | Freq: Once | ORAL | Status: AC
  Administered 2015-05-05: 650 mg via ORAL

## 2015-05-05 MED ORDER — DIPHENHYDRAMINE HCL 25 MG PO CAPS
25.0000 mg | ORAL_CAPSULE | Freq: Once | ORAL | Status: AC
  Administered 2015-05-05: 25 mg via ORAL

## 2015-05-05 MED ORDER — ACETAMINOPHEN 325 MG PO TABS
ORAL_TABLET | ORAL | Status: AC
  Filled 2015-05-05: qty 2

## 2015-05-05 MED ORDER — DIPHENHYDRAMINE HCL 25 MG PO CAPS
ORAL_CAPSULE | ORAL | Status: AC
  Filled 2015-05-05: qty 1

## 2015-05-05 NOTE — Patient Instructions (Signed)

## 2015-05-07 ENCOUNTER — Other Ambulatory Visit: Payer: Self-pay | Admitting: Radiology

## 2015-05-07 ENCOUNTER — Other Ambulatory Visit: Payer: Self-pay | Admitting: Physician Assistant

## 2015-05-07 LAB — TYPE AND SCREEN
ABO/RH(D): O POS
ANTIBODY SCREEN: NEGATIVE
Unit division: 0
Unit division: 0

## 2015-05-08 ENCOUNTER — Other Ambulatory Visit: Payer: Self-pay | Admitting: Radiology

## 2015-05-09 ENCOUNTER — Ambulatory Visit (HOSPITAL_COMMUNITY)
Admission: RE | Admit: 2015-05-09 | Discharge: 2015-05-09 | Disposition: A | Discharge status: Home / Self Care | Payer: Medicare Other | Source: Ambulatory Visit | Attending: Oncology | Admitting: Oncology

## 2015-05-09 ENCOUNTER — Encounter (HOSPITAL_COMMUNITY): Payer: Self-pay

## 2015-05-09 DIAGNOSIS — N529 Male erectile dysfunction, unspecified: Secondary | ICD-10-CM | POA: Insufficient documentation

## 2015-05-09 DIAGNOSIS — D469 Myelodysplastic syndrome, unspecified: Secondary | ICD-10-CM | POA: Insufficient documentation

## 2015-05-09 DIAGNOSIS — N401 Enlarged prostate with lower urinary tract symptoms: Secondary | ICD-10-CM | POA: Diagnosis not present

## 2015-05-09 DIAGNOSIS — D72822 Plasmacytosis: Secondary | ICD-10-CM | POA: Insufficient documentation

## 2015-05-09 DIAGNOSIS — Z8546 Personal history of malignant neoplasm of prostate: Secondary | ICD-10-CM | POA: Insufficient documentation

## 2015-05-09 DIAGNOSIS — Z86010 Personal history of colonic polyps: Secondary | ICD-10-CM | POA: Insufficient documentation

## 2015-05-09 DIAGNOSIS — N189 Chronic kidney disease, unspecified: Secondary | ICD-10-CM | POA: Diagnosis present

## 2015-05-09 DIAGNOSIS — C9000 Multiple myeloma not having achieved remission: Secondary | ICD-10-CM | POA: Insufficient documentation

## 2015-05-09 DIAGNOSIS — M109 Gout, unspecified: Secondary | ICD-10-CM | POA: Diagnosis not present

## 2015-05-09 DIAGNOSIS — D649 Anemia, unspecified: Secondary | ICD-10-CM | POA: Diagnosis not present

## 2015-05-09 DIAGNOSIS — F329 Major depressive disorder, single episode, unspecified: Secondary | ICD-10-CM | POA: Diagnosis not present

## 2015-05-09 DIAGNOSIS — F419 Anxiety disorder, unspecified: Secondary | ICD-10-CM | POA: Insufficient documentation

## 2015-05-09 DIAGNOSIS — D631 Anemia in chronic kidney disease: Secondary | ICD-10-CM | POA: Diagnosis present

## 2015-05-09 DIAGNOSIS — M1990 Unspecified osteoarthritis, unspecified site: Secondary | ICD-10-CM | POA: Insufficient documentation

## 2015-05-09 DIAGNOSIS — Z8711 Personal history of peptic ulcer disease: Secondary | ICD-10-CM | POA: Diagnosis not present

## 2015-05-09 DIAGNOSIS — D509 Iron deficiency anemia, unspecified: Secondary | ICD-10-CM | POA: Insufficient documentation

## 2015-05-09 DIAGNOSIS — I10 Essential (primary) hypertension: Secondary | ICD-10-CM | POA: Diagnosis not present

## 2015-05-09 DIAGNOSIS — E7800 Pure hypercholesterolemia, unspecified: Secondary | ICD-10-CM | POA: Diagnosis not present

## 2015-05-09 DIAGNOSIS — Z923 Personal history of irradiation: Secondary | ICD-10-CM | POA: Diagnosis not present

## 2015-05-09 LAB — BONE MARROW EXAM

## 2015-05-09 LAB — PROTIME-INR
INR: 1.04 (ref 0.00–1.49)
Prothrombin Time: 13.8 seconds (ref 11.6–15.2)

## 2015-05-09 LAB — CBC
HCT: 21.7 % — ABNORMAL LOW (ref 39.0–52.0)
Hemoglobin: 7 g/dL — ABNORMAL LOW (ref 13.0–17.0)
MCH: 29.9 pg (ref 26.0–34.0)
MCHC: 32.3 g/dL (ref 30.0–36.0)
MCV: 92.7 fL (ref 78.0–100.0)
PLATELETS: 243 10*3/uL (ref 150–400)
RBC: 2.34 MIL/uL — ABNORMAL LOW (ref 4.22–5.81)
RDW: 15.3 % (ref 11.5–15.5)
WBC: 8.1 10*3/uL (ref 4.0–10.5)

## 2015-05-09 LAB — APTT: aPTT: 36 seconds (ref 24–37)

## 2015-05-09 IMAGING — CT CT BIOPSY
1 series · 2 of 4 positions shown, 5 images · non-contrast
Comparison: none

INDICATION: 69-year-old male with anemia and fatigue

[Series 4: (hospital) 6.0 b30f · axial · 1.15mm/px · z∈[+1216,+1216]mm · 2 of 4 slices shown, 5 images]
[im 2/4  soft-tissue]
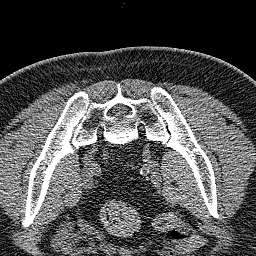
[im 2/4  lung]
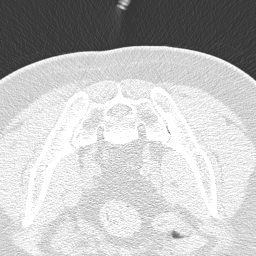
[im 2/4  bone]
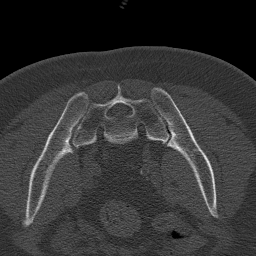
[im 3/4  soft-tissue]
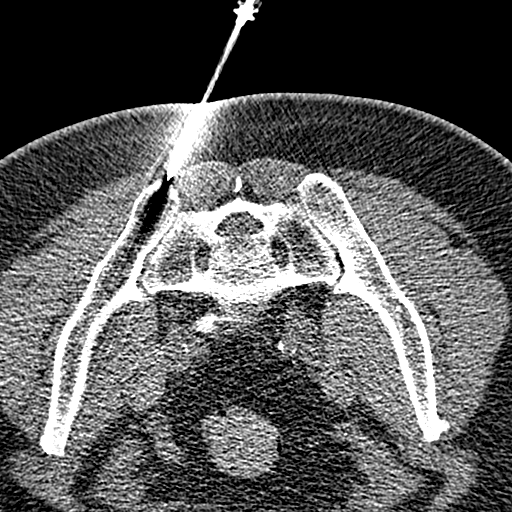
[im 3/4  lung]
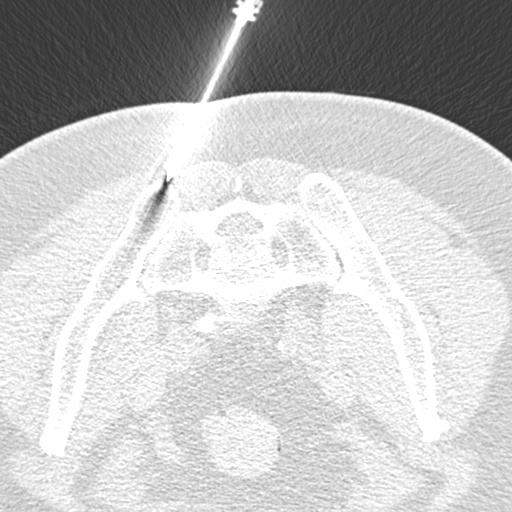

[2 of 4 positions shown; findings below may reference images not displayed]

EXAM:
CT GUIDED BONE MARROW ASPIRATION AND CORE BIOPSY

MEDICATIONS:
None.

ANESTHESIA/SEDATION:
Moderate (conscious) sedation was employed during this procedure. A
total of 2 milligrams versed and 50 micrograms fentanyl were
administered intravenously. The patient's level of consciousness and
vital signs were monitored continuously by radiology nursing
throughout the procedure under my direct supervision.

Total monitored sedation time: 10 minutes

FLUOROSCOPY TIME:  None

COMPLICATIONS:
None immediate.

Estimated blood loss: <25 mL

PROCEDURE:
Informed written consent was obtained from the patient after a
thorough discussion of the procedural risks, benefits and
alternatives. All questions were addressed. Maximal Sterile Barrier
Technique was utilized including caps, mask, sterile gowns, sterile
gloves, sterile drape, hand hygiene and skin antiseptic. A timeout
was performed prior to the initiation of the procedure.

The patient was positioned prone and non-contrast localization CT
was performed of the pelvis to demonstrate the iliac marrow spaces.

Maximal barrier sterile technique utilized including caps, mask,
sterile gowns, sterile gloves, large sterile drape, hand hygiene,
and betadine prep.

Under sterile conditions and local anesthesia, an 11 gauge coaxial
bone biopsy needle was advanced into the right iliac marrow space
using the on Control drill. Needle position was confirmed with CT
imaging. Initially, bone marrow aspiration was performed. Next, the
11 gauge outer cannula was utilized to obtain a right iliac bone
marrow core biopsy. Needle was removed. Hemostasis was obtained with
compression. The patient tolerated the procedure well. Samples were
prepared with the cytotechnologist.
IMPRESSION: CT-guided right iliac bone marrow biopsy.

## 2015-05-09 MED ORDER — FENTANYL CITRATE (PF) 100 MCG/2ML IJ SOLN
INTRAMUSCULAR | Status: AC | PRN
  Administered 2015-05-09 (×2): 25 ug via INTRAVENOUS

## 2015-05-09 MED ORDER — MIDAZOLAM HCL 2 MG/2ML IJ SOLN
INTRAMUSCULAR | Status: AC | PRN
  Administered 2015-05-09: 1 mg via INTRAVENOUS
  Administered 2015-05-09 (×2): 0.5 mg via INTRAVENOUS

## 2015-05-09 MED ORDER — HYDROCODONE-ACETAMINOPHEN 5-325 MG PO TABS
1.0000 | ORAL_TABLET | ORAL | Status: DC | PRN
  Filled 2015-05-09: qty 2

## 2015-05-09 MED ORDER — MIDAZOLAM HCL 2 MG/2ML IJ SOLN
INTRAMUSCULAR | Status: AC
  Filled 2015-05-09: qty 4

## 2015-05-09 MED ORDER — SODIUM CHLORIDE 0.9 % IV SOLN
INTRAVENOUS | Status: DC
  Administered 2015-05-09: 08:00:00 via INTRAVENOUS

## 2015-05-09 MED ORDER — FENTANYL CITRATE (PF) 100 MCG/2ML IJ SOLN
INTRAMUSCULAR | Status: AC
  Filled 2015-05-09: qty 2

## 2015-05-09 NOTE — Sedation Documentation (Signed)
Patient denies pain and is resting comfortably.  

## 2015-05-09 NOTE — H&P (Signed)
Chief Complaint: anemia  Referring Physician:Dr. Zola Button  Supervising Physician: Jacqulynn Cadet  HPI: Jacob [name] is an 70 y.o. male who has been followed by Dr. Alen Blew for anemia.  He has had some changes in the lambda chain and the IgG monoclonal proteins.  Due to his persistent anemia and feelings of fatigue, a request has been made for a bone marrow biopsy.  The patient presents today with no new complaints.  Past Medical History:  Past Medical History  Diagnosis Date  . Prostate cancer ([place]) 08/14/11    Adenocarcinoma,gleason:3+3=6,& 3+4=7,PSA=5.66  . Anemia     hx iron deficiency  . Hypertension   . Depression   . BPH with obstruction/lower urinary tract symptoms   . ED (erectile dysfunction)   . Night sweats   . Back pain   . Heartburn   . Ulcer     peptic ulcer hx  . Hypercholesterolemia   . Arthritis     gout  . Anxiety   . History of radiation therapy 11/03/11-12/29/11    prostate    Past Surgical History:  Past Surgical History  Procedure Laterality Date  . Prostate biopsy  08/14/11    Adenocarcinoma/volume=58.51cc,gleason=3+3=6 & 3+4=7  . Colon polyps bx  11/24/06    colon,transverse and rectosigmoid polyps:tubular adenomas and hyperplastic polyps,no high grade dysplasia or malignancy   . Gastric bx  11/24/06    chronic active gastritis,with metaplasia and focal changaes of xanthelasma  . Duodenal bx  11/24/06    benign  . Tonsillectomy      70 years old  . Insertion prostate radiation seed  12-29-11    Family History:  Family History  Problem Relation Age of Onset  . Colon cancer Neg Hx   . Alcohol abuse Brother 50  . Alcohol abuse Father     Social History:  reports that he quit smoking about 15 years ago. His smoking use included Cigarettes. He has a 45 pack-year smoking history. He has never used smokeless tobacco. He reports that he drinks about 3.6 oz of alcohol per week. He reports that he does not use illicit  drugs.  Allergies: No Known Allergies  Medications:   Medication List    ASK your doctor about these medications        allopurinol 100 MG tablet  Commonly known as:  ZYLOPRIM  Take 100 mg by mouth as needed.     buPROPion 100 MG tablet  Commonly known as:  WELLBUTRIN  Take 150 mg by mouth daily.     BYSTOLIC 20 MG Tabs  Generic drug:  Nebivolol HCl  Take 20 mg by mouth daily.     diazepam 5 MG tablet  Commonly known as:  VALIUM  Take 5 mg by mouth at bedtime.     furosemide 80 MG tablet  Commonly known as:  LASIX  Take 80 mg by mouth. Taking 1/2 tablet     gabapentin 800 MG tablet  Commonly known as:  NEURONTIN  Take 800 mg by mouth at bedtime.     LOSARTAN POTASSIUM-HCTZ PO  Take by mouth daily.     Melatonin 10 MG Tabs  Take 1 tablet by mouth daily.     OMEPRAZOLE PO  Take 20 mg by mouth daily.     tamsulosin 0.4 MG Caps capsule  Commonly known as:  FLOMAX  TAKE 1 CAPSULE BY MOUTH DAILY     VALERIAN PO  Take 1 tablet by mouth at bedtime.  valsartan 320 MG tablet  Commonly known as:  DIOVAN  Take 320 mg by mouth daily.     Verapamil HCl CR 300 MG Cp24  Take 1 tablet by mouth daily.        Please HPI for pertinent positives, otherwise complete 10 system ROS negative.  Mallampati Score: MD Evaluation Airway: WNL Heart: WNL Abdomen: WNL Chest/ Lungs: WNL ASA  Classification: 3 Mallampati/Airway Score: Two  Physical Exam: BP 130/77 mmHg  Pulse 74  Temp(Src) 98.8 F (37.1 C) (Oral)  Resp 18  Ht _0  (1.88 m)  Wt 282 lb (127.914 kg)  BMI 36.19 kg/m2  SpO2 99% Body mass index is 36.19 kg/(m^2). General: pleasant, WD, WN black male who is laying in bed in NAD HEENT: head is normocephalic, atraumatic.  Sclera are noninjected.  PERRL.  Ears and nose without any masses or lesions.  Mouth is pink and moist Heart: regular, rate, and rhythm.  Normal s1,s2. No obvious murmurs, gallops, or rubs noted.  Palpable radial and pedal pulses  bilaterally Lungs: CTAB, no wheezes, rhonchi, or rales noted.  Respiratory effort nonlabored Abd: soft, NT, ND, +BS, no masses, hernias, or organomegaly Psych: A&Ox3 with an appropriate affect.   Labs: Results for orders placed or performed during the hospital encounter of 05/09/15 (from the past 48 hour(s))  APTT upon arrival     Status: None   Collection Time: 05/09/15  7:35 AM  Result Value Ref Range   aPTT 36 24 - 37 seconds  CBC upon arrival     Status: Abnormal   Collection Time: 05/09/15  7:35 AM  Result Value Ref Range   WBC 8.1 4.0 - 10.5 K/uL   RBC 2.34 (L) 4.22 - 5.81 MIL/uL   Hemoglobin 7.0 (L) 13.0 - 17.0 g/dL   HCT 21.7 (L) 39.0 - 52.0 %   MCV 92.7 78.0 - 100.0 fL   MCH 29.9 26.0 - 34.0 pg   MCHC 32.3 30.0 - 36.0 g/dL   RDW 15.3 11.5 - 15.5 %   Platelets 243 150 - 400 K/uL  Protime-INR upon arrival     Status: None   Collection Time: 05/09/15  7:35 AM  Result Value Ref Range   Prothrombin Time 13.8 11.6 - 15.2 seconds   INR 1.04 0.00 - 1.49    Imaging: No results found.  Assessment/Plan 1. Anemia -will plan to proceed with a BMBX today -labs and vitals have been reviewed and are ok to proceed. -Risks and Benefits discussed with the patient including, but not limited to bleeding, infection, damage to adjacent structures or low yield requiring additional tests. All of the patient's questions were answered, patient is agreeable to proceed. Consent signed and in chart.    Thank you for this interesting consult.  I greatly enjoyed meeting Becket Hinderliter and look forward to participating in their care.  A copy of this report was sent to the requesting provider on this date.  Electronically Signed: Henreitta Cea 05/09/2015, 9:16 AM   I spent a total of  30 Minutes  outpatient in face to face in clinical consultation, greater than 50% of which was counseling/coordinating care for BMBX, anemia

## 2015-05-09 NOTE — Procedures (Signed)
Interventional Radiology Procedure Note  Procedure: CT guided aspirate and core biopsy of right iliac bone Complications: None Recommendations: - Bedrest supine x 1 hrs - Hydrocodone PRN  Pain - Follow biopsy results  Signed,  Cliffton Spradley K. Chayna Surratt, MD   

## 2015-05-09 NOTE — Discharge Instructions (Signed)
Bone Marrow Aspiration and Bone Marrow Biopsy, Care After °Refer to this sheet in the next few weeks. These instructions provide you with information about caring for yourself after your procedure. Your health care provider may also give you more specific instructions. Your treatment has been planned according to current medical practices, but problems sometimes occur. Call your health care provider if you have any problems or questions after your procedure. °WHAT TO EXPECT AFTER THE PROCEDURE °After your procedure, it is common to have: °· Soreness or tenderness around the puncture site. °· Bruising. °HOME CARE INSTRUCTIONS °· Take medicines only as directed by your health care provider. °· Follow your health care provider's instructions about: °¨ Puncture site care. °¨ Bandage (dressing) changes and removal. °· Bathe and shower as directed by your health care provider. °· Check your puncture site every day for signs of infection. Watch for: °¨ Redness, swelling, or pain. °¨ Fluid, blood, or pus. °· Return to your normal activities as directed by your health care provider. °· Keep all follow-up visits as directed by your health care provider. This is important. °SEEK MEDICAL CARE IF: °· You have a fever. °· You have uncontrollable bleeding. °· You have redness, swelling, or pain at the site of your puncture. °· You have fluid, blood, or pus coming from your puncture site. °  °This information is not intended to replace advice given to you by your health care provider. Make sure you discuss any questions you have with your health care provider. °  °Document Released: 07/19/2004 Document Revised: 05/16/2014 Document Reviewed: 12/21/2013 °Elsevier Interactive Patient Education ©2016 [employer] °Moderate Conscious Sedation, Adult, Care After °Refer to this sheet in the next few weeks. These instructions provide you with information on caring for yourself after your procedure. Your health care provider may also give  you more specific instructions. Your treatment has been planned according to current medical practices, but problems sometimes occur. Call your health care provider if you have any problems or questions after your procedure. °WHAT TO EXPECT AFTER THE PROCEDURE  °After your procedure: °· You may feel sleepy, clumsy, and have poor balance for several hours. °· Vomiting may occur if you eat too soon after the procedure. °HOME CARE INSTRUCTIONS °· Do not participate in any activities where you could become injured for at least 24 hours. Do not: °¨ Drive. °¨ Swim. °¨ Ride a bicycle. °¨ Operate heavy machinery. °¨ Cook. °¨ Use power tools. °¨ Climb ladders. °¨ Work from a high place. °· Do not make important decisions or sign legal documents until you are improved. °· If you vomit, drink water, juice, or soup when you can drink without vomiting. Make sure you have little or no nausea before eating solid foods. °· Only take over-the-counter or prescription medicines for pain, discomfort, or fever as directed by your health care provider. °· Make sure you and your family fully understand everything about the medicines given to you, including what side effects may occur. °· You should not drink alcohol, take sleeping pills, or take medicines that cause drowsiness for at least 24 hours. °· If you smoke, do not smoke without supervision. °· If you are feeling better, you may resume normal activities 24 hours after you were sedated. °· Keep all appointments with your health care provider. °SEEK MEDICAL CARE IF: °· Your skin is pale or bluish in color. °· You continue to feel nauseous or vomit. °· Your pain is getting worse and is not helped by medicine. °·   You have bleeding or swelling. °· You are still sleepy or feeling clumsy after 24 hours. °SEEK IMMEDIATE MEDICAL CARE IF: °· You develop a rash. °· You have difficulty breathing. °· You develop any type of allergic problem. °· You have a fever. °MAKE SURE YOU: °· Understand  these instructions. °· Will watch your condition. °· Will get help right away if you are not doing well or get worse. °  °This information is not intended to replace advice given to you by your health care provider. Make sure you discuss any questions you have with your health care provider. °  °Document Released: 10/20/2012 Document Revised: 01/20/2014 Document Reviewed: 10/20/2012 °Elsevier Interactive Patient Education ©2016 [employer] ° ° °

## 2015-05-11 ENCOUNTER — Ambulatory Visit (HOSPITAL_BASED_OUTPATIENT_CLINIC_OR_DEPARTMENT_OTHER): Payer: Medicare Other

## 2015-05-11 VITALS — BP 112/67 | HR 62 | Temp 98.2°F | Resp 18

## 2015-05-11 DIAGNOSIS — N189 Chronic kidney disease, unspecified: Secondary | ICD-10-CM | POA: Diagnosis not present

## 2015-05-11 DIAGNOSIS — D631 Anemia in chronic kidney disease: Secondary | ICD-10-CM | POA: Diagnosis not present

## 2015-05-11 MED ORDER — SODIUM CHLORIDE 0.9 % IV SOLN
Freq: Once | INTRAVENOUS | Status: AC
  Administered 2015-05-11: 13:00:00 via INTRAVENOUS

## 2015-05-11 MED ORDER — SODIUM CHLORIDE 0.9 % IV SOLN
510.0000 mg | Freq: Once | INTRAVENOUS | Status: AC
  Administered 2015-05-11: 510 mg via INTRAVENOUS
  Filled 2015-05-11: qty 17

## 2015-05-11 NOTE — Patient Instructions (Signed)

## 2015-05-11 NOTE — Progress Notes (Signed)
ONLY Feraheme today, no Aranesp per Dr. Alen Blew.

## 2015-05-18 ENCOUNTER — Ambulatory Visit (HOSPITAL_BASED_OUTPATIENT_CLINIC_OR_DEPARTMENT_OTHER): Payer: Medicare Other

## 2015-05-18 VITALS — BP 118/66 | HR 69 | Temp 99.0°F | Resp 18

## 2015-05-18 DIAGNOSIS — N189 Chronic kidney disease, unspecified: Secondary | ICD-10-CM | POA: Diagnosis not present

## 2015-05-18 DIAGNOSIS — D631 Anemia in chronic kidney disease: Secondary | ICD-10-CM | POA: Diagnosis not present

## 2015-05-18 MED ORDER — SODIUM CHLORIDE 0.9 % IV SOLN
510.0000 mg | Freq: Once | INTRAVENOUS | Status: AC
  Administered 2015-05-18: 510 mg via INTRAVENOUS
  Filled 2015-05-18: qty 17

## 2015-05-18 MED ORDER — SODIUM CHLORIDE 0.9 % IV SOLN
Freq: Once | INTRAVENOUS | Status: AC
  Administered 2015-05-18: 13:00:00 via INTRAVENOUS

## 2015-05-18 NOTE — Patient Instructions (Signed)

## 2015-05-22 LAB — TISSUE HYBRIDIZATION (BONE MARROW)-NCBH

## 2015-05-22 LAB — CHROMOSOME ANALYSIS, BONE MARROW

## 2015-05-24 ENCOUNTER — Ambulatory Visit (HOSPITAL_BASED_OUTPATIENT_CLINIC_OR_DEPARTMENT_OTHER): Payer: Medicare Other

## 2015-05-24 ENCOUNTER — Telehealth: Payer: Self-pay | Admitting: Oncology

## 2015-05-24 ENCOUNTER — Encounter: Payer: Self-pay | Admitting: Oncology

## 2015-05-24 ENCOUNTER — Other Ambulatory Visit (HOSPITAL_BASED_OUTPATIENT_CLINIC_OR_DEPARTMENT_OTHER): Payer: Medicare Other

## 2015-05-24 ENCOUNTER — Ambulatory Visit (HOSPITAL_BASED_OUTPATIENT_CLINIC_OR_DEPARTMENT_OTHER): Payer: Medicare Other | Admitting: Oncology

## 2015-05-24 VITALS — BP 123/62 | HR 74 | Temp 98.3°F | Resp 19 | Ht 74.0 in | Wt 278.3 lb

## 2015-05-24 DIAGNOSIS — D631 Anemia in chronic kidney disease: Secondary | ICD-10-CM

## 2015-05-24 DIAGNOSIS — D509 Iron deficiency anemia, unspecified: Secondary | ICD-10-CM | POA: Diagnosis not present

## 2015-05-24 DIAGNOSIS — Z8546 Personal history of malignant neoplasm of prostate: Secondary | ICD-10-CM

## 2015-05-24 DIAGNOSIS — N189 Chronic kidney disease, unspecified: Secondary | ICD-10-CM | POA: Diagnosis not present

## 2015-05-24 LAB — CBC WITH DIFFERENTIAL/PLATELET
BASO%: 0.3 % (ref 0.0–2.0)
Basophils Absolute: 0 10*3/uL (ref 0.0–0.1)
EOS%: 4.5 % (ref 0.0–7.0)
Eosinophils Absolute: 0.3 10*3/uL (ref 0.0–0.5)
HCT: 24.8 % — ABNORMAL LOW (ref 38.4–49.9)
HGB: 7.7 g/dL — ABNORMAL LOW (ref 13.0–17.1)
LYMPH%: 17.1 % (ref 14.0–49.0)
MCH: 30.8 pg (ref 27.2–33.4)
MCHC: 31 g/dL — AB (ref 32.0–36.0)
MCV: 99.2 fL — ABNORMAL HIGH (ref 79.3–98.0)
MONO#: 0.5 10*3/uL (ref 0.1–0.9)
MONO%: 6.2 % (ref 0.0–14.0)
NEUT#: 5.4 10*3/uL (ref 1.5–6.5)
NEUT%: 71.9 % (ref 39.0–75.0)
Platelets: 218 10*3/uL (ref 140–400)
RBC: 2.5 10*6/uL — ABNORMAL LOW (ref 4.20–5.82)
RDW: 19.1 % — ABNORMAL HIGH (ref 11.0–14.6)
WBC: 7.5 10*3/uL (ref 4.0–10.3)
lymph#: 1.3 10*3/uL (ref 0.9–3.3)
nRBC: 0 % (ref 0–0)

## 2015-05-24 MED ORDER — DARBEPOETIN ALFA 300 MCG/0.6ML IJ SOSY
300.0000 ug | PREFILLED_SYRINGE | Freq: Once | INTRAMUSCULAR | Status: AC
  Administered 2015-05-24: 300 ug via SUBCUTANEOUS
  Filled 2015-05-24: qty 0.6

## 2015-05-24 NOTE — Progress Notes (Signed)
Hematology and Oncology Follow Up Visit  Jacob [name] [id number] 11-24-1945 70 y.o. 05/24/2015 1:12 PM Jacob Cedar, MD   Principle Diagnosis: 70 year old gentleman with normocytic, normochromic anemia diagnosed March 2017. His anemia related to renal insufficiency with creatinine of 2.9 creatinine clearance 44 mL/m.  He also was diagnosed with prostate cancer in 2013 with a Gleason score 3+4 = 7 and a PSA 5.66 stage TIc.   Prior Therapy:  He is status post radiation therapy for definitive treatment for his prostate cancer. Therapy concluded in December 2013. He is status post packed red cell transfusions in April 2017. He is status post bone marrow biopsy in April 2017. Results did not show any plasma cell disorder or myelodysplasia. He is status post IV iron infusion completed in April 2017.   Current therapy: Aranesp 300 g injection every 2 weeks to get his hemoglobin to 10.  Interim History: Jacob [name] presents today for a follow-up visit. Since the last visit, he is doing slightly better. He received IV iron infusion as well as packed red cell transfusion in April 2017. His performance status and exercise tolerance have improved since that therapy. He also underwent bone marrow biopsy and tolerated the procedure well. He denied any hematochezia or melena. He denied any change in his bowel habits. Did not any hematuria or hemoptysis. He denied any shortness of breath or dyspnea on exertion but still have some fatigue at times. He still able to drive and attends to activities of daily living.  He does not report any headaches, blurry vision, syncope or seizures. He does not report any fevers, chills, sweats or weight loss. Does not report any chest pain, palpitation, orthopnea or leg edema. He does not report any cough, wheezing or hemoptysis. He does not report any nausea, vomiting, abdominal pain, hematochezia or melena. He does not report any  frequency, urgency or hesitancy. He does not report any skeletal complaints of arthralgias or myalgias. Remaining review of systems unremarkable.   Medications: I have reviewed the patient's current medications.  Current Outpatient Prescriptions  Medication Sig Dispense Refill  . allopurinol (ZYLOPRIM) 100 MG tablet Take 100 mg by mouth as needed.     Marland Kitchen allopurinol (ZYLOPRIM) 300 MG tablet     . buPROPion (WELLBUTRIN XL) 300 MG 24 hr tablet     . buPROPion (WELLBUTRIN) 100 MG tablet Take 150 mg by mouth daily.     . diazepam (VALIUM) 5 MG tablet Take 5 mg by mouth at bedtime.    . furosemide (LASIX) 80 MG tablet Take 80 mg by mouth. Taking 1/2 tablet    . gabapentin (NEURONTIN) 800 MG tablet Take 800 mg by mouth at bedtime.     Marland Kitchen LOSARTAN POTASSIUM-HCTZ PO Take by mouth daily.     . Melatonin 10 MG TABS Take 1 tablet by mouth daily.    . Nebivolol HCl (BYSTOLIC) 20 MG TABS Take 20 mg by mouth daily.     Marland Kitchen OMEPRAZOLE PO Take 20 mg by mouth daily.     . tamsulosin (FLOMAX) 0.4 MG CAPS capsule TAKE 1 CAPSULE BY MOUTH DAILY 30 capsule 2  . traZODone (DESYREL) 50 MG tablet     . VALERIAN PO Take 1 tablet by mouth at bedtime.    . valsartan (DIOVAN) 320 MG tablet Take 320 mg by mouth daily.    . Verapamil HCl CR 300 MG CP24 Take 1 tablet by mouth daily.     No current facility-administered medications for this  visit.     Allergies: No Known Allergies  Past Medical History, Surgical history, Social history, and Family History were reviewed and updated.   Physical Exam: Blood pressure 123/62, pulse 74, temperature 98.3 F (36.8 C), temperature source Oral, resp. rate 19, height _0  (1.88 m), weight 278 lb 4.8 oz (126.236 kg), SpO2 98 %. ECOG: 1 General appearance: alert and cooperative appeared without distress. Head: Normocephalic, without obvious abnormality oral ulcers or lesions. Neck: no adenopathy Lymph nodes: Cervical, supraclavicular, and axillary nodes normal. Heart:regular  rate and rhythm, S1, S2 normal, no murmur, click, rub or gallop Lung:chest clear, no wheezing, rales, normal symmetric air entry Abdomin: soft, non-tender, without masses or organomegaly no rebound or guarding. EXT:no erythema, induration, or nodules   Lab Results: Lab Results  Component Value Date   WBC 7.5 05/24/2015   HGB 7.7* 05/24/2015   HCT 24.8* 05/24/2015   MCV 99.2* 05/24/2015   PLT 218 05/24/2015     Chemistry      Component Value Date/Time   NA 139 04/13/2015 1128   NA 141 11/08/2012 1649   K 4.8 04/13/2015 1128   K 4.1 11/08/2012 1649   CL 105 11/08/2012 1649   CO2 24 04/13/2015 1128   BUN 45.1* 04/13/2015 1128   BUN 25* 11/08/2012 1649   CREATININE 2.9* 04/13/2015 1128   CREATININE 1.60* 11/08/2012 1649      Component Value Date/Time   CALCIUM 9.4 04/13/2015 1128   ALKPHOS 46 04/13/2015 1128   AST 21 04/13/2015 1128   ALT 13 04/13/2015 1128   BILITOT <0.30 04/13/2015 1128        Impression and Plan:  70 year old woman with the following issues:  1. Normocytic, normochromic anemia presented initially with a hemoglobin of 9.2 now has drifted down to 6.0. His MCV is normal at 97 but does have an elevated RDW. His hemoglobin drifted down to 6 and required paclitaxel transfusion.  His workup has been completed at this time and his bone marrow biopsy was discussed today. He does not appear to have any bone marrow disease including myelodysplasia or plasma cell disorder. His increased plasma cells in the bone marrow appear to be reactive in nature.  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 His erythropoietin is inappropriately slightly elevated and his iron stores have been repleted with intravenous iron. Risks and benefits of agonist therapy was discussed today and is agreeable to proceed. Complications such as hypertension, thrombosis and injection-related complications were reviewed and he is ready  to proceed.  The plan is to proceed with an Aranesp 300 g every 2 weeks to get his hemoglobin up to 10. We will continue supportive transfusion as needed in the meantime.   2. Prostate cancer: He is status post radiation therapy which can contribute to bone marrow suppression and contribute to his anemia.   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 We will continue to monitor his M spike which is very minuscule at this time in the future.  4. Renal insufficiency: Followed by his primary care provider and my require nephrology follow-up in the future.  5. Follow-up: He will receive injections every 2 weeks and I will assess his response in 6 weeks.   Zola Button, MD 5/11/20171:12 PM

## 2015-05-24 NOTE — Telephone Encounter (Signed)
Gave and printed appt shced and avs for pt for May and JUNE

## 2015-06-01 ENCOUNTER — Encounter (HOSPITAL_COMMUNITY): Payer: Self-pay

## 2015-06-07 ENCOUNTER — Other Ambulatory Visit (HOSPITAL_BASED_OUTPATIENT_CLINIC_OR_DEPARTMENT_OTHER): Payer: Medicare Other

## 2015-06-07 ENCOUNTER — Ambulatory Visit (HOSPITAL_BASED_OUTPATIENT_CLINIC_OR_DEPARTMENT_OTHER): Payer: Medicare Other

## 2015-06-07 DIAGNOSIS — N189 Chronic kidney disease, unspecified: Secondary | ICD-10-CM

## 2015-06-07 DIAGNOSIS — D631 Anemia in chronic kidney disease: Secondary | ICD-10-CM

## 2015-06-07 LAB — COMPREHENSIVE METABOLIC PANEL
ALT: 12 U/L (ref 0–55)
AST: 18 U/L (ref 5–34)
Albumin: 3 g/dL — ABNORMAL LOW (ref 3.5–5.0)
Alkaline Phosphatase: 39 U/L — ABNORMAL LOW (ref 40–150)
Anion Gap: 6 mEq/L (ref 3–11)
BUN: 28.3 mg/dL — AB (ref 7.0–26.0)
CHLORIDE: 112 meq/L — AB (ref 98–109)
CO2: 21 meq/L — AB (ref 22–29)
CREATININE: 1.9 mg/dL — AB (ref 0.7–1.3)
Calcium: 9.2 mg/dL (ref 8.4–10.4)
EGFR: 40 mL/min/{1.73_m2} — ABNORMAL LOW (ref >90)
Glucose: 117 mg/dl (ref 70–140)
Potassium: 4.6 mEq/L (ref 3.5–5.1)
Sodium: 140 mEq/L (ref 136–145)
TOTAL PROTEIN: 6.3 g/dL — AB (ref 6.4–8.3)
Total Bilirubin: 0.3 mg/dL (ref 0.20–1.20)

## 2015-06-07 LAB — CBC WITH DIFFERENTIAL/PLATELET
BASO%: 0.2 % (ref 0.0–2.0)
BASOS ABS: 0 10*3/uL (ref 0.0–0.1)
EOS ABS: 0.4 10*3/uL (ref 0.0–0.5)
EOS%: 6.5 % (ref 0.0–7.0)
HCT: 24.4 % — ABNORMAL LOW (ref 38.4–49.9)
HGB: 7.7 g/dL — ABNORMAL LOW (ref 13.0–17.1)
LYMPH%: 14.6 % (ref 14.0–49.0)
MCH: 31 pg (ref 27.2–33.4)
MCHC: 31.6 g/dL — AB (ref 32.0–36.0)
MCV: 98.4 fL — AB (ref 79.3–98.0)
MONO#: 0.5 10*3/uL (ref 0.1–0.9)
MONO%: 9.5 % (ref 0.0–14.0)
NEUT#: 3.9 10*3/uL (ref 1.5–6.5)
NEUT%: 69.2 % (ref 39.0–75.0)
Platelets: 187 10*3/uL (ref 140–400)
RBC: 2.48 10*6/uL — AB (ref 4.20–5.82)
RDW: 17 % — ABNORMAL HIGH (ref 11.0–14.6)
WBC: 5.7 10*3/uL (ref 4.0–10.3)
lymph#: 0.8 10*3/uL — ABNORMAL LOW (ref 0.9–3.3)

## 2015-06-07 MED ORDER — DARBEPOETIN ALFA 300 MCG/0.6ML IJ SOSY
300.0000 ug | PREFILLED_SYRINGE | Freq: Once | INTRAMUSCULAR | Status: AC
  Administered 2015-06-07: 300 ug via SUBCUTANEOUS
  Filled 2015-06-07: qty 0.6

## 2015-06-07 NOTE — Patient Instructions (Signed)
Darbepoetin Alfa injection What is this medicine? DARBEPOETIN ALFA (dar be POE e tin AL fa) helps your body make more red blood cells. It is used to treat anemia caused by chronic kidney failure and chemotherapy. This medicine may be used for other purposes; ask your health care provider or pharmacist if you have questions. What should I tell my health care provider before I take this medicine? They need to know if you have any of these conditions: -blood clotting disorders or history of blood clots -cancer patient not on chemotherapy -cystic fibrosis -heart disease, such as angina, heart failure, or a history of a heart attack -hemoglobin level of 12 g/dL or greater -high blood pressure -low levels of folate, iron, or vitamin B12 -seizures -an unusual or allergic reaction to darbepoetin, erythropoietin, albumin, hamster proteins, latex, other medicines, foods, dyes, or preservatives -pregnant or trying to get pregnant -breast-feeding How should I use this medicine? This medicine is for injection into a vein or under the skin. It is usually given by a health care professional in a hospital or clinic setting. If you get this medicine at home, you will be taught how to prepare and give this medicine. Do not shake the solution before you withdraw a dose. Use exactly as directed. Take your medicine at regular intervals. Do not take your medicine more often than directed. It is important that you put your used needles and syringes in a special sharps container. Do not put them in a trash can. If you do not have a sharps container, call your pharmacist or healthcare provider to get one. Talk to your pediatrician regarding the use of this medicine in children. While this medicine may be used in children as young as 1 year for selected conditions, precautions do apply. Overdosage: If you think you have taken too much of this medicine contact a poison control center or emergency room at once. NOTE:  This medicine is only for you. Do not share this medicine with others. What if I miss a dose? If you miss a dose, take it as soon as you can. If it is almost time for your next dose, take only that dose. Do not take double or extra doses. What may interact with this medicine? Do not take this medicine with any of the following medications: -epoetin alfa This list may not describe all possible interactions. Give your health care provider a list of all the medicines, herbs, non-prescription drugs, or dietary supplements you use. Also tell them if you smoke, drink alcohol, or use illegal drugs. Some items may interact with your medicine. What should I watch for while using this medicine? Visit your prescriber or health care professional for regular checks on your progress and for the needed blood tests and blood pressure measurements. It is especially important for the doctor to make sure your hemoglobin level is in the desired range, to limit the risk of potential side effects and to give you the best benefit. Keep all appointments for any recommended tests. Check your blood pressure as directed. Ask your doctor what your blood pressure should be and when you should contact him or her. As your body makes more red blood cells, you may need to take iron, folic acid, or vitamin B supplements. Ask your doctor or health care provider which products are right for you. If you have kidney disease continue dietary restrictions, even though this medication can make you feel better. Talk with your doctor or health care professional about the   foods you eat and the vitamins that you take. What side effects may I notice from receiving this medicine? Side effects that you should report to your doctor or health care professional as soon as possible: -allergic reactions like skin rash, itching or hives, swelling of the face, lips, or tongue -breathing problems -changes in vision -chest pain -confusion, trouble speaking  or understanding -feeling faint or lightheaded, falls -high blood pressure -muscle aches or pains -pain, swelling, warmth in the leg -rapid weight gain -severe headaches -sudden numbness or weakness of the face, arm or leg -trouble walking, dizziness, loss of balance or coordination -seizures (convulsions) -swelling of the ankles, feet, hands -unusually weak or tired Side effects that usually do not require medical attention (report to your doctor or health care professional if they continue or are bothersome): -diarrhea -fever, chills (flu-like symptoms) -headaches -nausea, vomiting -redness, stinging, or swelling at site where injected This list may not describe all possible side effects. Call your doctor for medical advice about side effects. You may report side effects to FDA at 1-800-FDA-1088. Where should I keep my medicine? Keep out of the reach of children. Store in a refrigerator between 2 and 8 degrees C (36 and 46 degrees F). Do not freeze. Do not shake. Throw away any unused portion if using a single-dose vial. Throw away any unused medicine after the expiration date. NOTE: This sheet is a summary. It may not cover all possible information. If you have questions about this medicine, talk to your doctor, pharmacist, or health care provider.    2016, Elsevier/Gold Standard. (2007-12-14 10:23:57)  

## 2015-06-21 ENCOUNTER — Ambulatory Visit (HOSPITAL_COMMUNITY)
Admission: RE | Admit: 2015-06-21 | Discharge: 2015-06-21 | Disposition: A | Discharge status: Home / Self Care | Payer: Medicare Other | Source: Ambulatory Visit | Attending: Oncology | Admitting: Oncology

## 2015-06-21 ENCOUNTER — Ambulatory Visit (HOSPITAL_BASED_OUTPATIENT_CLINIC_OR_DEPARTMENT_OTHER): Payer: Medicare Other

## 2015-06-21 ENCOUNTER — Other Ambulatory Visit (HOSPITAL_BASED_OUTPATIENT_CLINIC_OR_DEPARTMENT_OTHER): Payer: Medicare Other

## 2015-06-21 ENCOUNTER — Other Ambulatory Visit: Payer: Self-pay | Admitting: *Deleted

## 2015-06-21 VITALS — BP 136/68 | HR 73 | Temp 98.3°F | Resp 20

## 2015-06-21 DIAGNOSIS — D649 Anemia, unspecified: Secondary | ICD-10-CM | POA: Diagnosis present

## 2015-06-21 DIAGNOSIS — N189 Chronic kidney disease, unspecified: Secondary | ICD-10-CM | POA: Diagnosis not present

## 2015-06-21 DIAGNOSIS — D631 Anemia in chronic kidney disease: Secondary | ICD-10-CM

## 2015-06-21 LAB — CBC WITH DIFFERENTIAL/PLATELET
BASO%: 1 % (ref 0.0–2.0)
BASOS ABS: 0.1 10*3/uL (ref 0.0–0.1)
EOS%: 9.5 % — AB (ref 0.0–7.0)
Eosinophils Absolute: 0.5 10*3/uL (ref 0.0–0.5)
HCT: 24.9 % — ABNORMAL LOW (ref 38.4–49.9)
HGB: 7.8 g/dL — ABNORMAL LOW (ref 13.0–17.1)
LYMPH#: 1 10*3/uL (ref 0.9–3.3)
LYMPH%: 16.9 % (ref 14.0–49.0)
MCH: 29.5 pg (ref 27.2–33.4)
MCHC: 31.3 g/dL — AB (ref 32.0–36.0)
MCV: 94.4 fL (ref 79.3–98.0)
MONO#: 0.7 10*3/uL (ref 0.1–0.9)
MONO%: 12.9 % (ref 0.0–14.0)
NEUT#: 3.4 10*3/uL (ref 1.5–6.5)
NEUT%: 59.7 % (ref 39.0–75.0)
Platelets: 246 10*3/uL (ref 140–400)
RBC: 2.63 10*6/uL — AB (ref 4.20–5.82)
RDW: 17.7 % — ABNORMAL HIGH (ref 11.0–14.6)
WBC: 5.7 10*3/uL (ref 4.0–10.3)

## 2015-06-21 MED ORDER — DARBEPOETIN ALFA 300 MCG/0.6ML IJ SOSY
300.0000 ug | PREFILLED_SYRINGE | Freq: Once | INTRAMUSCULAR | Status: AC
  Administered 2015-06-21: 300 ug via SUBCUTANEOUS
  Filled 2015-06-21: qty 0.6

## 2015-06-21 NOTE — Patient Instructions (Signed)
Darbepoetin Alfa injection What is this medicine? DARBEPOETIN ALFA (dar be POE e tin AL fa) helps your body make more red blood cells. It is used to treat anemia caused by chronic kidney failure and chemotherapy. This medicine may be used for other purposes; ask your health care provider or pharmacist if you have questions. What should I tell my health care provider before I take this medicine? They need to know if you have any of these conditions: -blood clotting disorders or history of blood clots -cancer patient not on chemotherapy -cystic fibrosis -heart disease, such as angina, heart failure, or a history of a heart attack -hemoglobin level of 12 g/dL or greater -high blood pressure -low levels of folate, iron, or vitamin B12 -seizures -an unusual or allergic reaction to darbepoetin, erythropoietin, albumin, hamster proteins, latex, other medicines, foods, dyes, or preservatives -pregnant or trying to get pregnant -breast-feeding How should I use this medicine? This medicine is for injection into a vein or under the skin. It is usually given by a health care professional in a hospital or clinic setting. If you get this medicine at home, you will be taught how to prepare and give this medicine. Do not shake the solution before you withdraw a dose. Use exactly as directed. Take your medicine at regular intervals. Do not take your medicine more often than directed. It is important that you put your used needles and syringes in a special sharps container. Do not put them in a trash can. If you do not have a sharps container, call your pharmacist or healthcare provider to get one. Talk to your pediatrician regarding the use of this medicine in children. While this medicine may be used in children as young as 1 year for selected conditions, precautions do apply. Overdosage: If you think you have taken too much of this medicine contact a poison control center or emergency room at once. NOTE:  This medicine is only for you. Do not share this medicine with others. What if I miss a dose? If you miss a dose, take it as soon as you can. If it is almost time for your next dose, take only that dose. Do not take double or extra doses. What may interact with this medicine? Do not take this medicine with any of the following medications: -epoetin alfa This list may not describe all possible interactions. Give your health care provider a list of all the medicines, herbs, non-prescription drugs, or dietary supplements you use. Also tell them if you smoke, drink alcohol, or use illegal drugs. Some items may interact with your medicine. What should I watch for while using this medicine? Visit your prescriber or health care professional for regular checks on your progress and for the needed blood tests and blood pressure measurements. It is especially important for the doctor to make sure your hemoglobin level is in the desired range, to limit the risk of potential side effects and to give you the best benefit. Keep all appointments for any recommended tests. Check your blood pressure as directed. Ask your doctor what your blood pressure should be and when you should contact him or her. As your body makes more red blood cells, you may need to take iron, folic acid, or vitamin B supplements. Ask your doctor or health care provider which products are right for you. If you have kidney disease continue dietary restrictions, even though this medication can make you feel better. Talk with your doctor or health care professional about the   foods you eat and the vitamins that you take. What side effects may I notice from receiving this medicine? Side effects that you should report to your doctor or health care professional as soon as possible: -allergic reactions like skin rash, itching or hives, swelling of the face, lips, or tongue -breathing problems -changes in vision -chest pain -confusion, trouble speaking  or understanding -feeling faint or lightheaded, falls -high blood pressure -muscle aches or pains -pain, swelling, warmth in the leg -rapid weight gain -severe headaches -sudden numbness or weakness of the face, arm or leg -trouble walking, dizziness, loss of balance or coordination -seizures (convulsions) -swelling of the ankles, feet, hands -unusually weak or tired Side effects that usually do not require medical attention (report to your doctor or health care professional if they continue or are bothersome): -diarrhea -fever, chills (flu-like symptoms) -headaches -nausea, vomiting -redness, stinging, or swelling at site where injected This list may not describe all possible side effects. Call your doctor for medical advice about side effects. You may report side effects to FDA at 1-800-FDA-1088. Where should I keep my medicine? Keep out of the reach of children. Store in a refrigerator between 2 and 8 degrees C (36 and 46 degrees F). Do not freeze. Do not shake. Throw away any unused portion if using a single-dose vial. Throw away any unused medicine after the expiration date. NOTE: This sheet is a summary. It may not cover all possible information. If you have questions about this medicine, talk to your doctor, pharmacist, or health care provider.    2016, Elsevier/Gold Standard. (2007-12-14 10:23:57)  

## 2015-06-22 ENCOUNTER — Ambulatory Visit (HOSPITAL_BASED_OUTPATIENT_CLINIC_OR_DEPARTMENT_OTHER): Payer: Medicare Other

## 2015-06-22 VITALS — BP 129/74 | HR 62 | Temp 97.9°F | Resp 17

## 2015-06-22 DIAGNOSIS — D631 Anemia in chronic kidney disease: Secondary | ICD-10-CM

## 2015-06-22 DIAGNOSIS — N189 Chronic kidney disease, unspecified: Secondary | ICD-10-CM

## 2015-06-22 DIAGNOSIS — D649 Anemia, unspecified: Secondary | ICD-10-CM | POA: Diagnosis not present

## 2015-06-22 LAB — PREPARE RBC (CROSSMATCH)

## 2015-06-22 MED ORDER — DIPHENHYDRAMINE HCL 25 MG PO CAPS
ORAL_CAPSULE | ORAL | Status: AC
  Filled 2015-06-22: qty 1

## 2015-06-22 MED ORDER — SODIUM CHLORIDE 0.9 % IV SOLN
250.0000 mL | Freq: Once | INTRAVENOUS | Status: AC
  Administered 2015-06-22: 250 mL via INTRAVENOUS

## 2015-06-22 MED ORDER — ACETAMINOPHEN 325 MG PO TABS
650.0000 mg | ORAL_TABLET | Freq: Once | ORAL | Status: AC
  Administered 2015-06-22: 650 mg via ORAL

## 2015-06-22 MED ORDER — DIPHENHYDRAMINE HCL 25 MG PO CAPS
25.0000 mg | ORAL_CAPSULE | Freq: Once | ORAL | Status: AC
  Administered 2015-06-22: 25 mg via ORAL

## 2015-06-22 MED ORDER — ACETAMINOPHEN 325 MG PO TABS
ORAL_TABLET | ORAL | Status: AC
  Filled 2015-06-22: qty 2

## 2015-06-22 NOTE — Patient Instructions (Signed)

## 2015-06-25 LAB — TYPE AND SCREEN
ABO/RH(D): O POS
Antibody Screen: NEGATIVE
UNIT DIVISION: 0
UNIT DIVISION: 0

## 2015-07-05 ENCOUNTER — Ambulatory Visit (HOSPITAL_BASED_OUTPATIENT_CLINIC_OR_DEPARTMENT_OTHER): Payer: Medicare Other

## 2015-07-05 ENCOUNTER — Telehealth: Payer: Self-pay | Admitting: Oncology

## 2015-07-05 ENCOUNTER — Other Ambulatory Visit (HOSPITAL_BASED_OUTPATIENT_CLINIC_OR_DEPARTMENT_OTHER): Payer: Medicare Other

## 2015-07-05 ENCOUNTER — Ambulatory Visit (HOSPITAL_BASED_OUTPATIENT_CLINIC_OR_DEPARTMENT_OTHER): Payer: Medicare Other | Admitting: Oncology

## 2015-07-05 VITALS — BP 135/62 | HR 77 | Temp 98.5°F | Resp 18 | Ht 74.0 in | Wt 280.6 lb

## 2015-07-05 DIAGNOSIS — D509 Iron deficiency anemia, unspecified: Secondary | ICD-10-CM

## 2015-07-05 DIAGNOSIS — D631 Anemia in chronic kidney disease: Secondary | ICD-10-CM | POA: Diagnosis not present

## 2015-07-05 DIAGNOSIS — N189 Chronic kidney disease, unspecified: Secondary | ICD-10-CM

## 2015-07-05 LAB — CBC WITH DIFFERENTIAL/PLATELET
BASO%: 0.8 % (ref 0.0–2.0)
BASOS ABS: 0 10*3/uL (ref 0.0–0.1)
EOS%: 5.6 % (ref 0.0–7.0)
Eosinophils Absolute: 0.4 10*3/uL (ref 0.0–0.5)
HEMATOCRIT: 26.9 % — AB (ref 38.4–49.9)
HEMOGLOBIN: 8.5 g/dL — AB (ref 13.0–17.1)
LYMPH#: 0.8 10*3/uL — AB (ref 0.9–3.3)
LYMPH%: 12.8 % — ABNORMAL LOW (ref 14.0–49.0)
MCH: 29.3 pg (ref 27.2–33.4)
MCHC: 31.6 g/dL — AB (ref 32.0–36.0)
MCV: 92.8 fL (ref 79.3–98.0)
MONO#: 0.6 10*3/uL (ref 0.1–0.9)
MONO%: 8.9 % (ref 0.0–14.0)
NEUT#: 4.6 10*3/uL (ref 1.5–6.5)
NEUT%: 71.9 % (ref 39.0–75.0)
PLATELETS: 239 10*3/uL (ref 140–400)
RBC: 2.89 10*6/uL — ABNORMAL LOW (ref 4.20–5.82)
RDW: 18.2 % — AB (ref 11.0–14.6)
WBC: 6.4 10*3/uL (ref 4.0–10.3)

## 2015-07-05 MED ORDER — SODIUM CHLORIDE 0.9 % IV SOLN: Freq: Once | INTRAVENOUS | Status: DC

## 2015-07-05 MED ORDER — DARBEPOETIN ALFA 300 MCG/0.6ML IJ SOSY
300.0000 ug | PREFILLED_SYRINGE | Freq: Once | INTRAMUSCULAR | Status: AC
  Administered 2015-07-05: 300 ug via SUBCUTANEOUS
  Filled 2015-07-05: qty 0.6

## 2015-07-05 NOTE — Progress Notes (Signed)
Hematology and Oncology Follow Up Visit  Jacob [name] [id number] [birth year] 70 y.o. 07/05/2015 1:32 PM [name], MD   Principle Diagnosis: 70 year old gentleman with normocytic, normochromic anemia diagnosed March 2017. His anemia is due to renal insufficiency with creatinine of 2.9 creatinine clearance 44 mL/m.  He also was diagnosed with prostate cancer in 2013 with a Gleason score 3+4 = 7 and a PSA 5.66 stage TIc.   Prior Therapy:  He is status post radiation therapy for definitive treatment for his prostate cancer. Therapy concluded in December 2013. He is status post packed red cell transfusions in April 2017. He is status post bone marrow biopsy in April 2017. Results did not show any plasma cell disorder or myelodysplasia. He is status post IV iron infusion completed in April 2017.   Current therapy: Aranesp 300 g injection every 2 weeks to get his hemoglobin to 10.  Interim History: Jacob [name] presents today for a follow-up visit. Since the last visit, he is reporting some improvement in his overall exercise tolerance. He received packed red cell transfusions and currently on Aranesp. He denied any complications related to those. His performance status and exercise tolerance have improved since the last visit. He denied any hematochezia or melena. He denied any change in his bowel habits. Did not any hematuria or hemoptysis. He denied any shortness of breath or dyspnea on exertion but still have some fatigue at times. He still able to drive and attends to activities of daily living.  He does not report any headaches, blurry vision, syncope or seizures. He does not report any fevers, chills, sweats or weight loss. Does not report any chest pain, palpitation, orthopnea or leg edema. He does not report any cough, wheezing or hemoptysis. He does not report any nausea, vomiting, abdominal pain, hematochezia or melena. He does not report any frequency,  urgency or hesitancy. He does not report any skeletal complaints of arthralgias or myalgias. Remaining review of systems unremarkable.   Medications: I have reviewed the patient's current medications.  Current Outpatient Prescriptions  Medication Sig Dispense Refill  . allopurinol (ZYLOPRIM) 300 MG tablet     . buPROPion (WELLBUTRIN XL) 300 MG 24 hr tablet     . diazepam (VALIUM) 5 MG tablet Take 5 mg by mouth at bedtime.    . furosemide (LASIX) 80 MG tablet Take 80 mg by mouth. Taking 1/2 tablet    . gabapentin (NEURONTIN) 800 MG tablet Take 800 mg by mouth at bedtime.     Marland Kitchen LOSARTAN POTASSIUM-HCTZ PO Take by mouth daily.     . Melatonin 10 MG TABS Take 1 tablet by mouth daily.    . Nebivolol HCl (BYSTOLIC) 20 MG TABS Take 20 mg by mouth daily.     Marland Kitchen OMEPRAZOLE PO Take 20 mg by mouth daily.     . tamsulosin (FLOMAX) 0.4 MG CAPS capsule TAKE 1 CAPSULE BY MOUTH DAILY 30 capsule 2  . traZODone (DESYREL) 50 MG tablet     . valsartan (DIOVAN) 320 MG tablet Take 320 mg by mouth daily.    . Verapamil HCl CR 300 MG CP24 Take 1 tablet by mouth daily.     No current facility-administered medications for this visit.     Allergies: No Known Allergies  Past Medical History, Surgical history, Social history, and Family History were reviewed and updated.   Physical Exam: Blood pressure 135/62, pulse 77, temperature 98.5 F (36.9 C), temperature source Oral, resp. rate 18, height 6' 2"  (1.88  m), weight 280 lb 9.6 oz (127.279 kg), SpO2 96 %. ECOG: 1 General appearance: Pleasant-appearing gentleman without distress. Head: Normocephalic, without obvious abnormality no oral thrush noted. Neck: no adenopathy Lymph nodes: Cervical, supraclavicular, and axillary nodes normal. Heart:regular rate and rhythm, S1, S2 normal, no murmur, click, rub or gallop Lung:chest clear, no wheezing, rales, normal symmetric air entry Abdomin: soft, non-tender, without masses or organomegaly no shifting dullness or  ascites. EXT:no erythema, induration, or nodules   Lab Results: Lab Results  Component Value Date   WBC 6.4 07/05/2015   HGB 8.5* 07/05/2015   HCT 26.9* 07/05/2015   MCV 92.8 07/05/2015   PLT 239 07/05/2015     Chemistry      Component Value Date/Time   NA 140 06/07/2015 1314   NA 141 11/08/2012 1649   K 4.6 06/07/2015 1314   K 4.1 11/08/2012 1649   CL 105 11/08/2012 1649   CO2 21* 06/07/2015 1314   BUN 28.3* 06/07/2015 1314   BUN 25* 11/08/2012 1649   CREATININE 1.9* 06/07/2015 1314   CREATININE 1.60* 11/08/2012 1649      Component Value Date/Time   CALCIUM 9.2 06/07/2015 1314   ALKPHOS 39* 06/07/2015 1314   AST 18 06/07/2015 1314   ALT 12 06/07/2015 1314   BILITOT 0.30 06/07/2015 1314        Impression and Plan:  70 year old woman with the following issues:  1. Normocytic, normochromic anemia presented initially with a hemoglobin of 9.2 now has drifted down to 6.0. His MCV is normal at 97 but does have an elevated RDW. His hemoglobin drifted down to 6 and required Red cell transfusion.  His workup has been completed at this time and his bone marrow biopsy did not show any evidence of myelodysplasia or plasma cell disorder.  His anemia is related to renal insufficiency as well as iron deficiency. He is status post IV iron replacement and iron studies will be repeated in 2 weeks. He will continue to receive Aranesp every 2 weeks to keep his hemoglobin above 10. He will require periodic packed red cell transfusion as well. He is asymptomatic at this time with a hemoglobin of 8.5 and we'll hold off on any transfusion today.  2. Prostate cancer: He is status post radiation therapy which can contribute to bone marrow suppression and contribute to his anemia.   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 We will monitor this and repeat protein studies in 6 months.  4. Renal insufficiency: Followed by his primary  care provider and my require nephrology follow-up in the future.  5. Follow-up: He will receive injections every 2 weeks and I will assess his response in 8 weeks.   Zola Button, MD 6/22/20171:32 PM

## 2015-07-05 NOTE — Telephone Encounter (Signed)
per pof to sch pt appt-gave pt copy of avs °

## 2015-07-05 NOTE — Patient Instructions (Signed)
Darbepoetin Alfa injection What is this medicine? DARBEPOETIN ALFA (dar be POE e tin AL fa) helps your body make more red blood cells. It is used to treat anemia caused by chronic kidney failure and chemotherapy. This medicine may be used for other purposes; ask your health care provider or pharmacist if you have questions. What should I tell my health care provider before I take this medicine? They need to know if you have any of these conditions: -blood clotting disorders or history of blood clots -cancer patient not on chemotherapy -cystic fibrosis -heart disease, such as angina, heart failure, or a history of a heart attack -hemoglobin level of 12 g/dL or greater -high blood pressure -low levels of folate, iron, or vitamin B12 -seizures -an unusual or allergic reaction to darbepoetin, erythropoietin, albumin, hamster proteins, latex, other medicines, foods, dyes, or preservatives -pregnant or trying to get pregnant -breast-feeding How should I use this medicine? This medicine is for injection into a vein or under the skin. It is usually given by a health care professional in a hospital or clinic setting. If you get this medicine at home, you will be taught how to prepare and give this medicine. Do not shake the solution before you withdraw a dose. Use exactly as directed. Take your medicine at regular intervals. Do not take your medicine more often than directed. It is important that you put your used needles and syringes in a special sharps container. Do not put them in a trash can. If you do not have a sharps container, call your pharmacist or healthcare provider to get one. Talk to your pediatrician regarding the use of this medicine in children. While this medicine may be used in children as young as 1 year for selected conditions, precautions do apply. Overdosage: If you think you have taken too much of this medicine contact a poison control center or emergency room at once. NOTE:  This medicine is only for you. Do not share this medicine with others. What if I miss a dose? If you miss a dose, take it as soon as you can. If it is almost time for your next dose, take only that dose. Do not take double or extra doses. What may interact with this medicine? Do not take this medicine with any of the following medications: -epoetin alfa This list may not describe all possible interactions. Give your health care provider a list of all the medicines, herbs, non-prescription drugs, or dietary supplements you use. Also tell them if you smoke, drink alcohol, or use illegal drugs. Some items may interact with your medicine. What should I watch for while using this medicine? Visit your prescriber or health care professional for regular checks on your progress and for the needed blood tests and blood pressure measurements. It is especially important for the doctor to make sure your hemoglobin level is in the desired range, to limit the risk of potential side effects and to give you the best benefit. Keep all appointments for any recommended tests. Check your blood pressure as directed. Ask your doctor what your blood pressure should be and when you should contact him or her. As your body makes more red blood cells, you may need to take iron, folic acid, or vitamin B supplements. Ask your doctor or health care provider which products are right for you. If you have kidney disease continue dietary restrictions, even though this medication can make you feel better. Talk with your doctor or health care professional about the   foods you eat and the vitamins that you take. What side effects may I notice from receiving this medicine? Side effects that you should report to your doctor or health care professional as soon as possible: -allergic reactions like skin rash, itching or hives, swelling of the face, lips, or tongue -breathing problems -changes in vision -chest pain -confusion, trouble speaking  or understanding -feeling faint or lightheaded, falls -high blood pressure -muscle aches or pains -pain, swelling, warmth in the leg -rapid weight gain -severe headaches -sudden numbness or weakness of the face, arm or leg -trouble walking, dizziness, loss of balance or coordination -seizures (convulsions) -swelling of the ankles, feet, hands -unusually weak or tired Side effects that usually do not require medical attention (report to your doctor or health care professional if they continue or are bothersome): -diarrhea -fever, chills (flu-like symptoms) -headaches -nausea, vomiting -redness, stinging, or swelling at site where injected This list may not describe all possible side effects. Call your doctor for medical advice about side effects. You may report side effects to FDA at 1-800-FDA-1088. Where should I keep my medicine? Keep out of the reach of children. Store in a refrigerator between 2 and 8 degrees C (36 and 46 degrees F). Do not freeze. Do not shake. Throw away any unused portion if using a single-dose vial. Throw away any unused medicine after the expiration date. NOTE: This sheet is a summary. It may not cover all possible information. If you have questions about this medicine, talk to your doctor, pharmacist, or health care provider.    2016, Elsevier/Gold Standard. (2007-12-14 10:23:57)  

## 2015-07-19 ENCOUNTER — Ambulatory Visit (HOSPITAL_BASED_OUTPATIENT_CLINIC_OR_DEPARTMENT_OTHER): Payer: Medicare Other

## 2015-07-19 ENCOUNTER — Other Ambulatory Visit (HOSPITAL_BASED_OUTPATIENT_CLINIC_OR_DEPARTMENT_OTHER): Payer: Medicare Other

## 2015-07-19 ENCOUNTER — Ambulatory Visit (HOSPITAL_COMMUNITY)
Admission: RE | Admit: 2015-07-19 | Discharge: 2015-07-19 | Disposition: A | Discharge status: Home / Self Care | Payer: Medicare Other | Source: Ambulatory Visit | Attending: Oncology | Admitting: Oncology

## 2015-07-19 VITALS — BP 124/60 | HR 71 | Temp 98.7°F | Resp 18

## 2015-07-19 DIAGNOSIS — N189 Chronic kidney disease, unspecified: Secondary | ICD-10-CM

## 2015-07-19 DIAGNOSIS — D631 Anemia in chronic kidney disease: Secondary | ICD-10-CM

## 2015-07-19 DIAGNOSIS — D649 Anemia, unspecified: Secondary | ICD-10-CM | POA: Insufficient documentation

## 2015-07-19 DIAGNOSIS — D509 Iron deficiency anemia, unspecified: Secondary | ICD-10-CM | POA: Diagnosis not present

## 2015-07-19 LAB — COMPREHENSIVE METABOLIC PANEL
ALBUMIN: 3.1 g/dL — AB (ref 3.5–5.0)
ALT: 17 U/L (ref 0–55)
ANION GAP: 7 meq/L (ref 3–11)
AST: 22 U/L (ref 5–34)
Alkaline Phosphatase: 47 U/L (ref 40–150)
BILIRUBIN TOTAL: 0.34 mg/dL (ref 0.20–1.20)
BUN: 36.1 mg/dL — AB (ref 7.0–26.0)
CALCIUM: 9.4 mg/dL (ref 8.4–10.4)
CHLORIDE: 109 meq/L (ref 98–109)
CO2: 23 mEq/L (ref 22–29)
CREATININE: 2.5 mg/dL — AB (ref 0.7–1.3)
EGFR: 30 mL/min/{1.73_m2} — ABNORMAL LOW (ref >90)
Glucose: 92 mg/dl (ref 70–140)
Potassium: 5.3 mEq/L — ABNORMAL HIGH (ref 3.5–5.1)
Sodium: 138 mEq/L (ref 136–145)
TOTAL PROTEIN: 6.7 g/dL (ref 6.4–8.3)

## 2015-07-19 LAB — CBC WITH DIFFERENTIAL/PLATELET
BASO%: 0.3 % (ref 0.0–2.0)
Basophils Absolute: 0 10*3/uL (ref 0.0–0.1)
EOS%: 4.5 % (ref 0.0–7.0)
Eosinophils Absolute: 0.3 10*3/uL (ref 0.0–0.5)
HEMATOCRIT: 21.4 % — AB (ref 38.4–49.9)
HEMOGLOBIN: 6.6 g/dL — AB (ref 13.0–17.1)
LYMPH#: 1.1 10*3/uL (ref 0.9–3.3)
LYMPH%: 16.8 % (ref 14.0–49.0)
MCH: 28.2 pg (ref 27.2–33.4)
MCHC: 30.8 g/dL — AB (ref 32.0–36.0)
MCV: 91.5 fL (ref 79.3–98.0)
MONO#: 0.6 10*3/uL (ref 0.1–0.9)
MONO%: 9.8 % (ref 0.0–14.0)
NEUT%: 68.6 % (ref 39.0–75.0)
NEUTROS ABS: 4.3 10*3/uL (ref 1.5–6.5)
NRBC: 0 % (ref 0–0)
PLATELETS: 248 10*3/uL (ref 140–400)
RBC: 2.34 10*6/uL — ABNORMAL LOW (ref 4.20–5.82)
RDW: 16.8 % — AB (ref 11.0–14.6)
WBC: 6.2 10*3/uL (ref 4.0–10.3)

## 2015-07-19 LAB — IRON AND TIBC
%SAT: 4 % — ABNORMAL LOW (ref 20–55)
IRON: 16 ug/dL — AB (ref 42–163)
TIBC: 368 ug/dL (ref 202–409)
UIBC: 351 ug/dL (ref 117–376)

## 2015-07-19 LAB — FERRITIN: FERRITIN: 16 ng/mL — AB (ref 22–316)

## 2015-07-19 MED ORDER — DARBEPOETIN ALFA 300 MCG/0.6ML IJ SOSY
300.0000 ug | PREFILLED_SYRINGE | Freq: Once | INTRAMUSCULAR | Status: AC
  Administered 2015-07-19: 300 ug via SUBCUTANEOUS
  Filled 2015-07-19: qty 0.6

## 2015-07-19 NOTE — Patient Instructions (Signed)
Darbepoetin Alfa injection What is this medicine? DARBEPOETIN ALFA (dar be POE e tin AL fa) helps your body make more red blood cells. It is used to treat anemia caused by chronic kidney failure and chemotherapy. This medicine may be used for other purposes; ask your health care provider or pharmacist if you have questions. What should I tell my health care provider before I take this medicine? They need to know if you have any of these conditions: -blood clotting disorders or history of blood clots -cancer patient not on chemotherapy -cystic fibrosis -heart disease, such as angina, heart failure, or a history of a heart attack -hemoglobin level of 12 g/dL or greater -high blood pressure -low levels of folate, iron, or vitamin B12 -seizures -an unusual or allergic reaction to darbepoetin, erythropoietin, albumin, hamster proteins, latex, other medicines, foods, dyes, or preservatives -pregnant or trying to get pregnant -breast-feeding How should I use this medicine? This medicine is for injection into a vein or under the skin. It is usually given by a health care professional in a hospital or clinic setting. If you get this medicine at home, you will be taught how to prepare and give this medicine. Do not shake the solution before you withdraw a dose. Use exactly as directed. Take your medicine at regular intervals. Do not take your medicine more often than directed. It is important that you put your used needles and syringes in a special sharps container. Do not put them in a trash can. If you do not have a sharps container, call your pharmacist or healthcare provider to get one. Talk to your pediatrician regarding the use of this medicine in children. While this medicine may be used in children as young as 1 year for selected conditions, precautions do apply. Overdosage: If you think you have taken too much of this medicine contact a poison control center or emergency room at once. NOTE:  This medicine is only for you. Do not share this medicine with others. What if I miss a dose? If you miss a dose, take it as soon as you can. If it is almost time for your next dose, take only that dose. Do not take double or extra doses. What may interact with this medicine? Do not take this medicine with any of the following medications: -epoetin alfa This list may not describe all possible interactions. Give your health care provider a list of all the medicines, herbs, non-prescription drugs, or dietary supplements you use. Also tell them if you smoke, drink alcohol, or use illegal drugs. Some items may interact with your medicine. What should I watch for while using this medicine? Visit your prescriber or health care professional for regular checks on your progress and for the needed blood tests and blood pressure measurements. It is especially important for the doctor to make sure your hemoglobin level is in the desired range, to limit the risk of potential side effects and to give you the best benefit. Keep all appointments for any recommended tests. Check your blood pressure as directed. Ask your doctor what your blood pressure should be and when you should contact him or her. As your body makes more red blood cells, you may need to take iron, folic acid, or vitamin B supplements. Ask your doctor or health care provider which products are right for you. If you have kidney disease continue dietary restrictions, even though this medication can make you feel better. Talk with your doctor or health care professional about the   foods you eat and the vitamins that you take. What side effects may I notice from receiving this medicine? Side effects that you should report to your doctor or health care professional as soon as possible: -allergic reactions like skin rash, itching or hives, swelling of the face, lips, or tongue -breathing problems -changes in vision -chest pain -confusion, trouble speaking  or understanding -feeling faint or lightheaded, falls -high blood pressure -muscle aches or pains -pain, swelling, warmth in the leg -rapid weight gain -severe headaches -sudden numbness or weakness of the face, arm or leg -trouble walking, dizziness, loss of balance or coordination -seizures (convulsions) -swelling of the ankles, feet, hands -unusually weak or tired Side effects that usually do not require medical attention (report to your doctor or health care professional if they continue or are bothersome): -diarrhea -fever, chills (flu-like symptoms) -headaches -nausea, vomiting -redness, stinging, or swelling at site where injected This list may not describe all possible side effects. Call your doctor for medical advice about side effects. You may report side effects to FDA at 1-800-FDA-1088. Where should I keep my medicine? Keep out of the reach of children. Store in a refrigerator between 2 and 8 degrees C (36 and 46 degrees F). Do not freeze. Do not shake. Throw away any unused portion if using a single-dose vial. Throw away any unused medicine after the expiration date. NOTE: This sheet is a summary. It may not cover all possible information. If you have questions about this medicine, talk to your doctor, pharmacist, or health care provider.    2016, Elsevier/Gold Standard. (2007-12-14 10:23:57)  

## 2015-07-19 NOTE — Progress Notes (Signed)
Patient's hgb is 6.6.  Will receive aranesp today and 2 units p-rbc's tomorrow. Orders in. Note to dr Hazeline Junker desk

## 2015-07-19 NOTE — Progress Notes (Signed)
Hgb = 6.6 today.  Patient received Aranesp 300 mcg today. Patient told to come in for 2 units of PRBCs tomorrow (07/20/15) at 8:00 am.  Patient verified understanding.

## 2015-07-20 ENCOUNTER — Ambulatory Visit (HOSPITAL_BASED_OUTPATIENT_CLINIC_OR_DEPARTMENT_OTHER): Payer: Medicare Other

## 2015-07-20 VITALS — BP 119/69 | HR 72 | Temp 97.9°F | Resp 17

## 2015-07-20 DIAGNOSIS — N189 Chronic kidney disease, unspecified: Secondary | ICD-10-CM

## 2015-07-20 DIAGNOSIS — D631 Anemia in chronic kidney disease: Secondary | ICD-10-CM | POA: Diagnosis not present

## 2015-07-20 DIAGNOSIS — D649 Anemia, unspecified: Secondary | ICD-10-CM | POA: Diagnosis not present

## 2015-07-20 LAB — PREPARE RBC (CROSSMATCH)

## 2015-07-20 MED ORDER — ACETAMINOPHEN 325 MG PO TABS
ORAL_TABLET | ORAL | Status: AC
Start: 2015-07-20 — End: 2015-07-20
  Filled 2015-07-20: qty 2

## 2015-07-20 MED ORDER — ACETAMINOPHEN 325 MG PO TABS
650.0000 mg | ORAL_TABLET | Freq: Once | ORAL | Status: AC
  Administered 2015-07-20: 650 mg via ORAL

## 2015-07-20 MED ORDER — SODIUM CHLORIDE 0.9 % IV SOLN
250.0000 mL | Freq: Once | INTRAVENOUS | Status: AC
  Administered 2015-07-20: 250 mL via INTRAVENOUS

## 2015-07-20 MED ORDER — DIPHENHYDRAMINE HCL 25 MG PO CAPS
ORAL_CAPSULE | ORAL | Status: AC
  Filled 2015-07-20: qty 1

## 2015-07-20 MED ORDER — DIPHENHYDRAMINE HCL 25 MG PO CAPS
25.0000 mg | ORAL_CAPSULE | Freq: Once | ORAL | Status: AC
  Administered 2015-07-20: 25 mg via ORAL

## 2015-07-20 NOTE — Patient Instructions (Signed)

## 2015-07-23 LAB — TYPE AND SCREEN
ABO/RH(D): O POS
Antibody Screen: NEGATIVE
UNIT DIVISION: 0
Unit division: 0

## 2015-08-01 ENCOUNTER — Other Ambulatory Visit: Payer: Self-pay | Admitting: *Deleted

## 2015-08-01 DIAGNOSIS — C61 Malignant neoplasm of prostate: Secondary | ICD-10-CM

## 2015-08-01 MED ORDER — DARBEPOETIN ALFA 300 MCG/0.6ML IJ SOSY: 300.0000 ug | PREFILLED_SYRINGE | Freq: Once | INTRAMUSCULAR | Status: DC

## 2015-08-02 ENCOUNTER — Other Ambulatory Visit: Payer: Self-pay | Admitting: *Deleted

## 2015-08-02 ENCOUNTER — Ambulatory Visit (HOSPITAL_BASED_OUTPATIENT_CLINIC_OR_DEPARTMENT_OTHER): Payer: Medicare Other

## 2015-08-02 ENCOUNTER — Other Ambulatory Visit (HOSPITAL_BASED_OUTPATIENT_CLINIC_OR_DEPARTMENT_OTHER): Payer: Medicare Other

## 2015-08-02 VITALS — BP 144/71 | HR 70 | Temp 98.4°F | Resp 18

## 2015-08-02 DIAGNOSIS — D631 Anemia in chronic kidney disease: Secondary | ICD-10-CM | POA: Diagnosis not present

## 2015-08-02 DIAGNOSIS — N184 Chronic kidney disease, stage 4 (severe): Secondary | ICD-10-CM

## 2015-08-02 DIAGNOSIS — D509 Iron deficiency anemia, unspecified: Secondary | ICD-10-CM

## 2015-08-02 DIAGNOSIS — N189 Chronic kidney disease, unspecified: Secondary | ICD-10-CM | POA: Diagnosis not present

## 2015-08-02 LAB — CBC WITH DIFFERENTIAL/PLATELET
BASO%: 1 % (ref 0.0–2.0)
BASOS ABS: 0.1 10*3/uL (ref 0.0–0.1)
EOS ABS: 0.3 10*3/uL (ref 0.0–0.5)
EOS%: 5.2 % (ref 0.0–7.0)
HCT: 24.7 % — ABNORMAL LOW (ref 38.4–49.9)
HEMOGLOBIN: 7.8 g/dL — AB (ref 13.0–17.1)
LYMPH%: 15.1 % (ref 14.0–49.0)
MCH: 27.8 pg (ref 27.2–33.4)
MCHC: 31.6 g/dL — ABNORMAL LOW (ref 32.0–36.0)
MCV: 88.2 fL (ref 79.3–98.0)
MONO#: 0.5 10*3/uL (ref 0.1–0.9)
MONO%: 10.2 % (ref 0.0–14.0)
NEUT%: 68.5 % (ref 39.0–75.0)
NEUTROS ABS: 3.7 10*3/uL (ref 1.5–6.5)
PLATELETS: 214 10*3/uL (ref 140–400)
RBC: 2.79 10*6/uL — ABNORMAL LOW (ref 4.20–5.82)
RDW: 17 % — AB (ref 11.0–14.6)
WBC: 5.4 10*3/uL (ref 4.0–10.3)
lymph#: 0.8 10*3/uL — ABNORMAL LOW (ref 0.9–3.3)

## 2015-08-02 MED ORDER — DARBEPOETIN ALFA 300 MCG/0.6ML IJ SOSY
300.0000 ug | PREFILLED_SYRINGE | Freq: Once | INTRAMUSCULAR | Status: AC
Start: 2015-08-02 — End: 2015-08-02
  Administered 2015-08-02: 300 ug via SUBCUTANEOUS
  Filled 2015-08-02: qty 0.6

## 2015-08-02 NOTE — Patient Instructions (Signed)
Darbepoetin Alfa injection What is this medicine? DARBEPOETIN ALFA (dar be POE e tin AL fa) helps your body make more red blood cells. It is used to treat anemia caused by chronic kidney failure and chemotherapy. This medicine may be used for other purposes; ask your health care provider or pharmacist if you have questions. What should I tell my health care provider before I take this medicine? They need to know if you have any of these conditions: -blood clotting disorders or history of blood clots -cancer patient not on chemotherapy -cystic fibrosis -heart disease, such as angina, heart failure, or a history of a heart attack -hemoglobin level of 12 g/dL or greater -high blood pressure -low levels of folate, iron, or vitamin B12 -seizures -an unusual or allergic reaction to darbepoetin, erythropoietin, albumin, hamster proteins, latex, other medicines, foods, dyes, or preservatives -pregnant or trying to get pregnant -breast-feeding How should I use this medicine? This medicine is for injection into a vein or under the skin. It is usually given by a health care professional in a hospital or clinic setting. If you get this medicine at home, you will be taught how to prepare and give this medicine. Do not shake the solution before you withdraw a dose. Use exactly as directed. Take your medicine at regular intervals. Do not take your medicine more often than directed. It is important that you put your used needles and syringes in a special sharps container. Do not put them in a trash can. If you do not have a sharps container, call your pharmacist or healthcare provider to get one. Talk to your pediatrician regarding the use of this medicine in children. While this medicine may be used in children as young as 1 year for selected conditions, precautions do apply. Overdosage: If you think you have taken too much of this medicine contact a poison control center or emergency room at once. NOTE:  This medicine is only for you. Do not share this medicine with others. What if I miss a dose? If you miss a dose, take it as soon as you can. If it is almost time for your next dose, take only that dose. Do not take double or extra doses. What may interact with this medicine? Do not take this medicine with any of the following medications: -epoetin alfa This list may not describe all possible interactions. Give your health care provider a list of all the medicines, herbs, non-prescription drugs, or dietary supplements you use. Also tell them if you smoke, drink alcohol, or use illegal drugs. Some items may interact with your medicine. What should I watch for while using this medicine? Visit your prescriber or health care professional for regular checks on your progress and for the needed blood tests and blood pressure measurements. It is especially important for the doctor to make sure your hemoglobin level is in the desired range, to limit the risk of potential side effects and to give you the best benefit. Keep all appointments for any recommended tests. Check your blood pressure as directed. Ask your doctor what your blood pressure should be and when you should contact him or her. As your body makes more red blood cells, you may need to take iron, folic acid, or vitamin B supplements. Ask your doctor or health care provider which products are right for you. If you have kidney disease continue dietary restrictions, even though this medication can make you feel better. Talk with your doctor or health care professional about the   foods you eat and the vitamins that you take. What side effects may I notice from receiving this medicine? Side effects that you should report to your doctor or health care professional as soon as possible: -allergic reactions like skin rash, itching or hives, swelling of the face, lips, or tongue -breathing problems -changes in vision -chest pain -confusion, trouble speaking  or understanding -feeling faint or lightheaded, falls -high blood pressure -muscle aches or pains -pain, swelling, warmth in the leg -rapid weight gain -severe headaches -sudden numbness or weakness of the face, arm or leg -trouble walking, dizziness, loss of balance or coordination -seizures (convulsions) -swelling of the ankles, feet, hands -unusually weak or tired Side effects that usually do not require medical attention (report to your doctor or health care professional if they continue or are bothersome): -diarrhea -fever, chills (flu-like symptoms) -headaches -nausea, vomiting -redness, stinging, or swelling at site where injected This list may not describe all possible side effects. Call your doctor for medical advice about side effects. You may report side effects to FDA at 1-800-FDA-1088. Where should I keep my medicine? Keep out of the reach of children. Store in a refrigerator between 2 and 8 degrees C (36 and 46 degrees F). Do not freeze. Do not shake. Throw away any unused portion if using a single-dose vial. Throw away any unused medicine after the expiration date. NOTE: This sheet is a summary. It may not cover all possible information. If you have questions about this medicine, talk to your doctor, pharmacist, or health care provider.    2016, Elsevier/Gold Standard. (2007-12-14 10:23:57)  

## 2015-08-14 ENCOUNTER — Encounter: Payer: Self-pay | Admitting: *Deleted

## 2015-08-16 ENCOUNTER — Other Ambulatory Visit (HOSPITAL_BASED_OUTPATIENT_CLINIC_OR_DEPARTMENT_OTHER): Payer: Medicare Other

## 2015-08-16 ENCOUNTER — Other Ambulatory Visit: Payer: Self-pay | Admitting: Oncology

## 2015-08-16 ENCOUNTER — Ambulatory Visit (HOSPITAL_BASED_OUTPATIENT_CLINIC_OR_DEPARTMENT_OTHER): Payer: Medicare Other

## 2015-08-16 DIAGNOSIS — D631 Anemia in chronic kidney disease: Secondary | ICD-10-CM

## 2015-08-16 DIAGNOSIS — N189 Chronic kidney disease, unspecified: Secondary | ICD-10-CM

## 2015-08-16 LAB — CBC WITH DIFFERENTIAL/PLATELET
BASO%: 0.5 % (ref 0.0–2.0)
Basophils Absolute: 0 10*3/uL (ref 0.0–0.1)
EOS%: 5.6 % (ref 0.0–7.0)
Eosinophils Absolute: 0.4 10*3/uL (ref 0.0–0.5)
HEMATOCRIT: 23.9 % — AB (ref 38.4–49.9)
HGB: 7.2 g/dL — ABNORMAL LOW (ref 13.0–17.1)
LYMPH%: 15.5 % (ref 14.0–49.0)
MCH: 26.4 pg — AB (ref 27.2–33.4)
MCHC: 30.1 g/dL — AB (ref 32.0–36.0)
MCV: 87.5 fL (ref 79.3–98.0)
MONO#: 0.8 10*3/uL (ref 0.1–0.9)
MONO%: 12 % (ref 0.0–14.0)
NEUT%: 66.4 % (ref 39.0–75.0)
NEUTROS ABS: 4.3 10*3/uL (ref 1.5–6.5)
Platelets: 228 10*3/uL (ref 140–400)
RBC: 2.73 10*6/uL — AB (ref 4.20–5.82)
RDW: 15.7 % — ABNORMAL HIGH (ref 11.0–14.6)
WBC: 6.4 10*3/uL (ref 4.0–10.3)
lymph#: 1 10*3/uL (ref 0.9–3.3)
nRBC: 0 % (ref 0–0)

## 2015-08-16 MED ORDER — DARBEPOETIN ALFA 300 MCG/0.6ML IJ SOSY
300.0000 ug | PREFILLED_SYRINGE | Freq: Once | INTRAMUSCULAR | Status: AC
  Administered 2015-08-16: 300 ug via SUBCUTANEOUS
  Filled 2015-08-16: qty 0.6

## 2015-08-16 NOTE — Progress Notes (Signed)
Pt's Hgb of 7.2 reviewed with Dr. Alen Blew. No blood orders given. Pt is to be be given Aranesp 300 as per routine. Pt denies difficulty breathing, or overt  Lack of energy.

## 2015-08-30 ENCOUNTER — Other Ambulatory Visit (HOSPITAL_BASED_OUTPATIENT_CLINIC_OR_DEPARTMENT_OTHER): Payer: Medicare Other

## 2015-08-30 ENCOUNTER — Ambulatory Visit (HOSPITAL_BASED_OUTPATIENT_CLINIC_OR_DEPARTMENT_OTHER): Payer: Medicare Other

## 2015-08-30 ENCOUNTER — Other Ambulatory Visit: Payer: Self-pay | Admitting: *Deleted

## 2015-08-30 ENCOUNTER — Ambulatory Visit (HOSPITAL_COMMUNITY)
Admission: RE | Admit: 2015-08-30 | Discharge: 2015-08-30 | Disposition: A | Discharge status: Home / Self Care | Payer: Medicare Other | Source: Ambulatory Visit | Attending: Oncology | Admitting: Oncology

## 2015-08-30 VITALS — BP 128/64 | HR 77 | Temp 98.8°F | Resp 22

## 2015-08-30 DIAGNOSIS — D631 Anemia in chronic kidney disease: Secondary | ICD-10-CM | POA: Diagnosis not present

## 2015-08-30 DIAGNOSIS — D509 Iron deficiency anemia, unspecified: Secondary | ICD-10-CM | POA: Diagnosis not present

## 2015-08-30 DIAGNOSIS — N189 Chronic kidney disease, unspecified: Secondary | ICD-10-CM

## 2015-08-30 DIAGNOSIS — D649 Anemia, unspecified: Secondary | ICD-10-CM

## 2015-08-30 DIAGNOSIS — D62 Acute posthemorrhagic anemia: Secondary | ICD-10-CM

## 2015-08-30 LAB — CBC WITH DIFFERENTIAL/PLATELET
BASO%: 0.8 % (ref 0.0–2.0)
Basophils Absolute: 0 10*3/uL (ref 0.0–0.1)
EOS%: 5.7 % (ref 0.0–7.0)
Eosinophils Absolute: 0.3 10*3/uL (ref 0.0–0.5)
HEMATOCRIT: 18.1 % — AB (ref 38.4–49.9)
HGB: 5.5 g/dL — CL (ref 13.0–17.1)
LYMPH%: 14.7 % (ref 14.0–49.0)
MCH: 26.1 pg — ABNORMAL LOW (ref 27.2–33.4)
MCHC: 30.4 g/dL — AB (ref 32.0–36.0)
MCV: 85.8 fL (ref 79.3–98.0)
MONO#: 0.6 10*3/uL (ref 0.1–0.9)
MONO%: 10.5 % (ref 0.0–14.0)
NEUT%: 68.3 % (ref 39.0–75.0)
NEUTROS ABS: 4 10*3/uL (ref 1.5–6.5)
PLATELETS: 261 10*3/uL (ref 140–400)
RBC: 2.11 10*6/uL — AB (ref 4.20–5.82)
RDW: 18 % — ABNORMAL HIGH (ref 11.0–14.6)
WBC: 5.9 10*3/uL (ref 4.0–10.3)
lymph#: 0.9 10*3/uL (ref 0.9–3.3)

## 2015-08-30 MED ORDER — DARBEPOETIN ALFA 300 MCG/0.6ML IJ SOSY
300.0000 ug | PREFILLED_SYRINGE | Freq: Once | INTRAMUSCULAR | Status: AC
  Administered 2015-08-30: 300 ug via SUBCUTANEOUS
  Filled 2015-08-30: qty 0.6

## 2015-08-30 NOTE — Progress Notes (Signed)
Pt's Hgb at  5.5, MD notified, injection of Aranesp gieven per order. Pt is to receive blood transfusion at [facility] on Saturday. Pt Instructed To Leave Blue Blood ID Band ON.  Pt  Instructed to report to ER at once if having difficulty breathing, chest pain, dizziness,  Pressure at chest, jaw numbness or Left arm tingling sensation. Pt verbalized understanding of instructions.

## 2015-08-30 NOTE — Patient Instructions (Signed)
Darbepoetin Alfa injection What is this medicine? DARBEPOETIN ALFA (dar be POE e tin AL fa) helps your body make more red blood cells. It is used to treat anemia caused by chronic kidney failure and chemotherapy. This medicine may be used for other purposes; ask your health care provider or pharmacist if you have questions. What should I tell my health care provider before I take this medicine? They need to know if you have any of these conditions: -blood clotting disorders or history of blood clots -cancer patient not on chemotherapy -cystic fibrosis -heart disease, such as angina, heart failure, or a history of a heart attack -hemoglobin level of 12 g/dL or greater -high blood pressure -low levels of folate, iron, or vitamin B12 -seizures -an unusual or allergic reaction to darbepoetin, erythropoietin, albumin, hamster proteins, latex, other medicines, foods, dyes, or preservatives -pregnant or trying to get pregnant -breast-feeding How should I use this medicine? This medicine is for injection into a vein or under the skin. It is usually given by a health care professional in a hospital or clinic setting. If you get this medicine at home, you will be taught how to prepare and give this medicine. Do not shake the solution before you withdraw a dose. Use exactly as directed. Take your medicine at regular intervals. Do not take your medicine more often than directed. It is important that you put your used needles and syringes in a special sharps container. Do not put them in a trash can. If you do not have a sharps container, call your pharmacist or healthcare provider to get one. Talk to your pediatrician regarding the use of this medicine in children. While this medicine may be used in children as young as 1 year for selected conditions, precautions do apply. Overdosage: If you think you have taken too much of this medicine contact a poison control center or emergency room at once. NOTE:  This medicine is only for you. Do not share this medicine with others. What if I miss a dose? If you miss a dose, take it as soon as you can. If it is almost time for your next dose, take only that dose. Do not take double or extra doses. What may interact with this medicine? Do not take this medicine with any of the following medications: -epoetin alfa This list may not describe all possible interactions. Give your health care provider a list of all the medicines, herbs, non-prescription drugs, or dietary supplements you use. Also tell them if you smoke, drink alcohol, or use illegal drugs. Some items may interact with your medicine. What should I watch for while using this medicine? Visit your prescriber or health care professional for regular checks on your progress and for the needed blood tests and blood pressure measurements. It is especially important for the doctor to make sure your hemoglobin level is in the desired range, to limit the risk of potential side effects and to give you the best benefit. Keep all appointments for any recommended tests. Check your blood pressure as directed. Ask your doctor what your blood pressure should be and when you should contact him or her. As your body makes more red blood cells, you may need to take iron, folic acid, or vitamin B supplements. Ask your doctor or health care provider which products are right for you. If you have kidney disease continue dietary restrictions, even though this medication can make you feel better. Talk with your doctor or health care professional about the   foods you eat and the vitamins that you take. What side effects may I notice from receiving this medicine? Side effects that you should report to your doctor or health care professional as soon as possible: -allergic reactions like skin rash, itching or hives, swelling of the face, lips, or tongue -breathing problems -changes in vision -chest pain -confusion, trouble speaking  or understanding -feeling faint or lightheaded, falls -high blood pressure -muscle aches or pains -pain, swelling, warmth in the leg -rapid weight gain -severe headaches -sudden numbness or weakness of the face, arm or leg -trouble walking, dizziness, loss of balance or coordination -seizures (convulsions) -swelling of the ankles, feet, hands -unusually weak or tired Side effects that usually do not require medical attention (report to your doctor or health care professional if they continue or are bothersome): -diarrhea -fever, chills (flu-like symptoms) -headaches -nausea, vomiting -redness, stinging, or swelling at site where injected This list may not describe all possible side effects. Call your doctor for medical advice about side effects. You may report side effects to FDA at 1-800-FDA-1088. Where should I keep my medicine? Keep out of the reach of children. Store in a refrigerator between 2 and 8 degrees C (36 and 46 degrees F). Do not freeze. Do not shake. Throw away any unused portion if using a single-dose vial. Throw away any unused medicine after the expiration date. NOTE: This sheet is a summary. It may not cover all possible information. If you have questions about this medicine, talk to your doctor, pharmacist, or health care provider.    2016, Elsevier/Gold Standard. (2007-12-14 10:23:57)  

## 2015-08-31 ENCOUNTER — Other Ambulatory Visit: Payer: Self-pay | Admitting: Medical Oncology

## 2015-09-01 ENCOUNTER — Ambulatory Visit: Payer: Medicare Other

## 2015-09-01 ENCOUNTER — Other Ambulatory Visit: Payer: Self-pay | Admitting: *Deleted

## 2015-09-01 DIAGNOSIS — D509 Iron deficiency anemia, unspecified: Secondary | ICD-10-CM | POA: Diagnosis not present

## 2015-09-01 DIAGNOSIS — D649 Anemia, unspecified: Secondary | ICD-10-CM

## 2015-09-01 LAB — PREPARE RBC (CROSSMATCH)

## 2015-09-01 MED ORDER — SODIUM CHLORIDE 0.9 % IV SOLN: 250.0000 mL | Freq: Once | INTRAVENOUS | Status: DC

## 2015-09-01 NOTE — Patient Instructions (Signed)

## 2015-09-03 LAB — TYPE AND SCREEN
ABO/RH(D): O POS
Antibody Screen: NEGATIVE
UNIT DIVISION: 0
Unit division: 0

## 2015-09-13 ENCOUNTER — Encounter (HOSPITAL_COMMUNITY): Payer: Medicare Other

## 2015-09-13 ENCOUNTER — Ambulatory Visit (HOSPITAL_BASED_OUTPATIENT_CLINIC_OR_DEPARTMENT_OTHER): Payer: Medicare Other | Admitting: Oncology

## 2015-09-13 ENCOUNTER — Other Ambulatory Visit: Payer: Self-pay | Admitting: *Deleted

## 2015-09-13 ENCOUNTER — Ambulatory Visit (HOSPITAL_BASED_OUTPATIENT_CLINIC_OR_DEPARTMENT_OTHER): Payer: Medicare Other

## 2015-09-13 ENCOUNTER — Other Ambulatory Visit (HOSPITAL_BASED_OUTPATIENT_CLINIC_OR_DEPARTMENT_OTHER): Payer: Medicare Other

## 2015-09-13 VITALS — BP 115/65 | HR 74 | Temp 98.5°F | Resp 18 | Ht 74.0 in | Wt 289.6 lb

## 2015-09-13 DIAGNOSIS — N189 Chronic kidney disease, unspecified: Secondary | ICD-10-CM | POA: Diagnosis not present

## 2015-09-13 DIAGNOSIS — D631 Anemia in chronic kidney disease: Secondary | ICD-10-CM

## 2015-09-13 DIAGNOSIS — D62 Acute posthemorrhagic anemia: Secondary | ICD-10-CM

## 2015-09-13 DIAGNOSIS — D509 Iron deficiency anemia, unspecified: Secondary | ICD-10-CM

## 2015-09-13 DIAGNOSIS — Z8546 Personal history of malignant neoplasm of prostate: Secondary | ICD-10-CM

## 2015-09-13 LAB — CBC WITH DIFFERENTIAL/PLATELET
BASO%: 0.7 % (ref 0.0–2.0)
Basophils Absolute: 0 10*3/uL (ref 0.0–0.1)
EOS%: 4.9 % (ref 0.0–7.0)
Eosinophils Absolute: 0.3 10*3/uL (ref 0.0–0.5)
HEMATOCRIT: 21.6 % — AB (ref 38.4–49.9)
HGB: 6.5 g/dL — CL (ref 13.0–17.1)
LYMPH#: 0.9 10*3/uL (ref 0.9–3.3)
LYMPH%: 14.8 % (ref 14.0–49.0)
MCH: 26.3 pg — ABNORMAL LOW (ref 27.2–33.4)
MCHC: 30.1 g/dL — AB (ref 32.0–36.0)
MCV: 87.4 fL (ref 79.3–98.0)
MONO#: 0.6 10*3/uL (ref 0.1–0.9)
MONO%: 9.1 % (ref 0.0–14.0)
NEUT#: 4.5 10*3/uL (ref 1.5–6.5)
NEUT%: 70.5 % (ref 39.0–75.0)
Platelets: 228 10*3/uL (ref 140–400)
RBC: 2.47 10*6/uL — ABNORMAL LOW (ref 4.20–5.82)
RDW: 19.8 % — AB (ref 11.0–14.6)
WBC: 6.4 10*3/uL (ref 4.0–10.3)

## 2015-09-13 MED ORDER — DIPHENHYDRAMINE HCL 25 MG PO CAPS
25.0000 mg | ORAL_CAPSULE | Freq: Once | ORAL | Status: AC
  Administered 2015-09-13: 25 mg via ORAL

## 2015-09-13 MED ORDER — SODIUM CHLORIDE 0.9 % IV SOLN
250.0000 mL | Freq: Once | INTRAVENOUS | Status: AC
  Administered 2015-09-13: 250 mL via INTRAVENOUS

## 2015-09-13 MED ORDER — ACETAMINOPHEN 325 MG PO TABS
ORAL_TABLET | ORAL | Status: AC
  Filled 2015-09-13: qty 2

## 2015-09-13 MED ORDER — DARBEPOETIN ALFA 300 MCG/0.6ML IJ SOSY
300.0000 ug | PREFILLED_SYRINGE | Freq: Once | INTRAMUSCULAR | Status: AC
  Administered 2015-09-13: 300 ug via SUBCUTANEOUS
  Filled 2015-09-13: qty 0.6

## 2015-09-13 MED ORDER — DIPHENHYDRAMINE HCL 25 MG PO CAPS
ORAL_CAPSULE | ORAL | Status: AC
  Filled 2015-09-13: qty 1

## 2015-09-13 MED ORDER — ACETAMINOPHEN 325 MG PO TABS
650.0000 mg | ORAL_TABLET | Freq: Once | ORAL | Status: AC
Start: 2015-09-13 — End: 2015-09-13
  Administered 2015-09-13: 650 mg via ORAL

## 2015-09-13 NOTE — Patient Instructions (Signed)
Darbepoetin Alfa injection What is this medicine? DARBEPOETIN ALFA (dar be POE e tin AL fa) helps your body make more red blood cells. It is used to treat anemia caused by chronic kidney failure and chemotherapy. This medicine may be used for other purposes; ask your health care provider or pharmacist if you have questions. What should I tell my health care provider before I take this medicine? They need to know if you have any of these conditions: -blood clotting disorders or history of blood clots -cancer patient not on chemotherapy -cystic fibrosis -heart disease, such as angina, heart failure, or a history of a heart attack -hemoglobin level of 12 g/dL or greater -high blood pressure -low levels of folate, iron, or vitamin B12 -seizures -an unusual or allergic reaction to darbepoetin, erythropoietin, albumin, hamster proteins, latex, other medicines, foods, dyes, or preservatives -pregnant or trying to get pregnant -breast-feeding How should I use this medicine? This medicine is for injection into a vein or under the skin. It is usually given by a health care professional in a hospital or clinic setting. If you get this medicine at home, you will be taught how to prepare and give this medicine. Do not shake the solution before you withdraw a dose. Use exactly as directed. Take your medicine at regular intervals. Do not take your medicine more often than directed. It is important that you put your used needles and syringes in a special sharps container. Do not put them in a trash can. If you do not have a sharps container, call your pharmacist or healthcare provider to get one. Talk to your pediatrician regarding the use of this medicine in children. While this medicine may be used in children as young as 1 year for selected conditions, precautions do apply. Overdosage: If you think you have taken too much of this medicine contact a poison control center or emergency room at once. NOTE:  This medicine is only for you. Do not share this medicine with others. What if I miss a dose? If you miss a dose, take it as soon as you can. If it is almost time for your next dose, take only that dose. Do not take double or extra doses. What may interact with this medicine? Do not take this medicine with any of the following medications: -epoetin alfa This list may not describe all possible interactions. Give your health care provider a list of all the medicines, herbs, non-prescription drugs, or dietary supplements you use. Also tell them if you smoke, drink alcohol, or use illegal drugs. Some items may interact with your medicine. What should I watch for while using this medicine? Visit your prescriber or health care professional for regular checks on your progress and for the needed blood tests and blood pressure measurements. It is especially important for the doctor to make sure your hemoglobin level is in the desired range, to limit the risk of potential side effects and to give you the best benefit. Keep all appointments for any recommended tests. Check your blood pressure as directed. Ask your doctor what your blood pressure should be and when you should contact him or her. As your body makes more red blood cells, you may need to take iron, folic acid, or vitamin B supplements. Ask your doctor or health care provider which products are right for you. If you have kidney disease continue dietary restrictions, even though this medication can make you feel better. Talk with your doctor or health care professional about the   foods you eat and the vitamins that you take. What side effects may I notice from receiving this medicine? Side effects that you should report to your doctor or health care professional as soon as possible: -allergic reactions like skin rash, itching or hives, swelling of the face, lips, or tongue -breathing problems -changes in vision -chest pain -confusion, trouble speaking  or understanding -feeling faint or lightheaded, falls -high blood pressure -muscle aches or pains -pain, swelling, warmth in the leg -rapid weight gain -severe headaches -sudden numbness or weakness of the face, arm or leg -trouble walking, dizziness, loss of balance or coordination -seizures (convulsions) -swelling of the ankles, feet, hands -unusually weak or tired Side effects that usually do not require medical attention (report to your doctor or health care professional if they continue or are bothersome): -diarrhea -fever, chills (flu-like symptoms) -headaches -nausea, vomiting -redness, stinging, or swelling at site where injected This list may not describe all possible side effects. Call your doctor for medical advice about side effects. You may report side effects to FDA at 1-800-FDA-1088. Where should I keep my medicine? Keep out of the reach of children. Store in a refrigerator between 2 and 8 degrees C (36 and 46 degrees F). Do not freeze. Do not shake. Throw away any unused portion if using a single-dose vial. Throw away any unused medicine after the expiration date. NOTE: This sheet is a summary. It may not cover all possible information. If you have questions about this medicine, talk to your doctor, pharmacist, or health care provider.    2016, Elsevier/Gold Standard. (2007-12-14 10:23:57)  

## 2015-09-13 NOTE — Progress Notes (Signed)
Hematology and Oncology Follow Up Visit  Jacob [name] [id number] 06-Aug-1945 70 y.o. 09/13/2015 1:42 PM Jacob Cedar, MD   Principle Diagnosis: 70 year old gentleman with normocytic, normochromic anemia diagnosed March 2017. His anemia is due to renal insufficiency with creatinine of 2.9 creatinine clearance 44 mL/m.  He also was diagnosed with prostate cancer in 2013 with a Gleason score 3+4 = 7 and a PSA 5.66 stage TIc.   Prior Therapy:  He is status post radiation therapy for definitive treatment for his prostate cancer. Therapy concluded in December 2013. He is status post packed red cell transfusions in April 2017. He is status post bone marrow biopsy in April 2017. Results did not show any plasma cell disorder or myelodysplasia. He is status post IV iron infusion completed in April 2017.   Current therapy: Aranesp 300 g injection every 2 weeks to get his hemoglobin to 10.  Interim History: Jacob [name] presents today for a follow-up visit. Since the last visit, he is reporting a decline in his energy and dyspnea on exertion. He did receive 2 units of packed red cell transfusion 2 weeks ago with improvement in his symptoms. Any pathological fractures or bone pain. He denied any falls or syncope. He denied any hematochezia or melena. He denied any change in his bowel habits. Did not any hematuria or hemoptysis.   He continues to be on Aranesp without any major complications. He is not able to work but able to attend to her activities of daily living is able to drive short distances but limited by his anemia and unable to work.  He does not report any headaches, blurry vision, syncope or seizures. He does not report any fevers, chills, sweats or weight loss. Does not report any chest pain, palpitation, orthopnea or leg edema. He does not report any cough, wheezing or hemoptysis. He does not report any nausea, vomiting, abdominal pain, hematochezia or  melena. He does not report any frequency, urgency or hesitancy. He does not report any skeletal complaints of arthralgias or myalgias. Remaining review of systems unremarkable.   Medications: I have reviewed the patient's current medications.  Current Outpatient Prescriptions  Medication Sig Dispense Refill  . allopurinol (ZYLOPRIM) 300 MG tablet     . buPROPion (WELLBUTRIN XL) 300 MG 24 hr tablet     . diazepam (VALIUM) 5 MG tablet Take 5 mg by mouth at bedtime.    . furosemide (LASIX) 80 MG tablet Take 80 mg by mouth. Taking 1/2 tablet    . gabapentin (NEURONTIN) 800 MG tablet Take 800 mg by mouth at bedtime.     Marland Kitchen LOSARTAN POTASSIUM-HCTZ PO Take by mouth daily.     . Melatonin 10 MG TABS Take 1 tablet by mouth daily.    . Nebivolol HCl (BYSTOLIC) 20 MG TABS Take 20 mg by mouth daily.     Marland Kitchen OMEPRAZOLE PO Take 20 mg by mouth daily.     . tamsulosin (FLOMAX) 0.4 MG CAPS capsule TAKE 1 CAPSULE BY MOUTH DAILY 30 capsule 2  . traZODone (DESYREL) 50 MG tablet     . valsartan (DIOVAN) 320 MG tablet Take 320 mg by mouth daily.    . Verapamil HCl CR 300 MG CP24 Take 1 tablet by mouth daily.     No current facility-administered medications for this visit.      Allergies: No Known Allergies  Past Medical History, Surgical history, Social history, and Family History were reviewed and updated.   Physical Exam: Blood pressure  115/65, pulse 74, temperature 98.5 F (36.9 C), temperature source Oral, resp. rate 18, height 6' 2"  (1.88 m), weight 289 lb 9.6 oz (131.4 kg), SpO2 100 %. ECOG: 1 General appearance: Alert, awake gentleman without distress.. Head: Normocephalic, without obvious abnormality no oral ulcers or lesions. Neck: no adenopathy Lymph nodes: Cervical, supraclavicular, and axillary nodes normal. Heart:regular rate and rhythm, S1, S2 normal, no murmur, click, rub or gallop Lung:chest clear, no wheezing, rales, normal symmetric air entry Abdomin: soft, non-tender, without masses  or organomegaly no shifting dullness or ascites. EXT:no erythema, induration, or nodules   Lab Results: Lab Results  Component Value Date   WBC 6.4 09/13/2015   HGB 6.5 (LL) 09/13/2015   HCT 21.6 (L) 09/13/2015   MCV 87.4 09/13/2015   PLT 228 09/13/2015     Chemistry      Component Value Date/Time   NA 138 07/19/2015 1327   K 5.3 (H) 07/19/2015 1327   CL 105 11/08/2012 1649   CO2 23 07/19/2015 1327   BUN 36.1 (H) 07/19/2015 1327   CREATININE 2.5 (H) 07/19/2015 1327      Component Value Date/Time   CALCIUM 9.4 07/19/2015 1327   ALKPHOS 47 07/19/2015 1327   AST 22 07/19/2015 1327   ALT 17 07/19/2015 1327   BILITOT 0.34 07/19/2015 1327        Impression and Plan:  70 year old woman with the following issues:  1. Normocytic, normochromic anemia presented initially with a hemoglobin of 9.2 now has drifted down to 6.0. His MCV is normal at 97 but does have an elevated RDW. His hemoglobin drifted down to 6 and required Red cell transfusion.  His workup has been completed at this time and his bone marrow biopsy did not show any evidence of myelodysplasia and showed slightly increase of his plasma cells suggestive of plasma cell disorder.  His anemia is related to renal insufficiency as well as iron deficiency. Despite aggressive factor support and aggressive IV iron infusion his hemoglobin remains low.  The plan is to proceed with another 2 units of packed red cell transfusion this week and replace his iron stores again. I will repeat his serum protein electrophoresis make sure this is not progressing and not contributing to his anemia.  2. Prostate cancer: He is status post radiation therapy which can contribute to bone marrow suppression and contribute to his anemia.   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 I will repeat his serum protein electrophoresis to ensure that he is not progressing to myeloma which may be  contributing to his anemia.  4. Renal insufficiency: Followed by his primary care provider and my require nephrology follow-up in the future.  5. Follow-up: He will receive injections every 2 weeks and I will assess his response in 6 weeks.   Zola Button, MD 8/31/20171:42 PM

## 2015-09-13 NOTE — Patient Instructions (Signed)
Blood Transfusion, Care After °Refer to this sheet in the next few weeks. These instructions provide you with information about caring for yourself after your procedure. Your health care provider may also give you more specific instructions. Your treatment has been planned according to current medical practices, but problems sometimes occur. Call your health care provider if you have any problems or questions after your procedure. °WHAT TO EXPECT AFTER THE PROCEDURE °After your procedure, it is common to have: °· Bruising and soreness at the IV site. °· Chills or fever. °· Headache. °HOME CARE INSTRUCTIONS °· Take medicines only as directed by your health care provider. Ask your health care provider if you can take an over-the-counter pain reliever in case you have a fever or headache a day or two after your transfusion. °· Return to your normal activities as directed by your health care provider. °SEEK MEDICAL CARE IF:  °· You develop redness or irritation at your IV site. °· You have persistent fever, chills, or headache. °· Your urine is darker than normal. °· Your urine turns pink, red, or brown.   °· The white part of your eye turns yellow (jaundice).   °· You feel weak after doing your normal activities.   °SEEK IMMEDIATE MEDICAL CARE IF:  °· You have trouble breathing. °· You have fever and chills along with: °¨ Anxiety. °¨ Chest or back pain. °¨ Flushed skin. °¨ Clammy skin. °¨ A rapid heartbeat. °¨ Nausea. °  °This information is not intended to replace advice given to you by your health care provider. Make sure you discuss any questions you have with your health care provider. °  °Document Released: 01/20/2014 Document Reviewed: 01/20/2014 °Elsevier Interactive Patient Education ©2016 Elsevier Inc. ° °

## 2015-09-14 ENCOUNTER — Telehealth: Payer: Self-pay | Admitting: Oncology

## 2015-09-14 ENCOUNTER — Ambulatory Visit (HOSPITAL_COMMUNITY)
Admission: RE | Admit: 2015-09-14 | Discharge: 2015-09-14 | Disposition: A | Discharge status: Home / Self Care | Payer: Medicare Other | Source: Ambulatory Visit | Attending: Oncology | Admitting: Oncology

## 2015-09-14 ENCOUNTER — Other Ambulatory Visit: Payer: Self-pay | Admitting: *Deleted

## 2015-09-14 DIAGNOSIS — D509 Iron deficiency anemia, unspecified: Secondary | ICD-10-CM | POA: Diagnosis present

## 2015-09-14 DIAGNOSIS — D649 Anemia, unspecified: Secondary | ICD-10-CM | POA: Insufficient documentation

## 2015-09-14 LAB — PREPARE RBC (CROSSMATCH)

## 2015-09-14 MED ORDER — ACETAMINOPHEN 325 MG PO TABS
650.0000 mg | ORAL_TABLET | Freq: Once | ORAL | Status: AC
  Administered 2015-09-14: 650 mg via ORAL
  Filled 2015-09-14: qty 2

## 2015-09-14 MED ORDER — DIPHENHYDRAMINE HCL 25 MG PO CAPS
25.0000 mg | ORAL_CAPSULE | Freq: Once | ORAL | Status: AC
  Administered 2015-09-14: 25 mg via ORAL
  Filled 2015-09-14: qty 1

## 2015-09-14 MED ORDER — SODIUM CHLORIDE 0.9 % IV SOLN
250.0000 mL | Freq: Once | INTRAVENOUS | Status: AC
  Administered 2015-09-14: 250 mL via INTRAVENOUS

## 2015-09-14 NOTE — Telephone Encounter (Signed)
Patient stopped by after infusion for scehduled. Appointments complete per 8/31 los. Patient given schedule for September and October

## 2015-09-14 NOTE — Progress Notes (Signed)
Patient ID: Jacob [name], male   DOB: [birth year], 70 y.o.   MRN: [id number] Provider: Zola Button MD  Associated Diagnosis: Symptomatic anemia (D64.9)  Procedure: Transfusion of 1 unit of PRBC via PIV.  Patient tolerated transfusion well. No reactions Went over discharge instructions and copy given to patient. Alert, oriented and ambulatory at time of discharge.

## 2015-09-14 NOTE — Discharge Instructions (Signed)
Blood Transfusion, Care After  These instructions give you information about caring for yourself after your procedure. Your doctor may also give you more specific instructions. Call your doctor if you have any problems or questions after your procedure.   HOME CARE   Take medicines only as told by your doctor. Ask your doctor if you can take an over-the-counter pain reliever if you have a fever or headache a day or two after your procedure.   Return to your normal activities as told by your doctor.  GET HELP IF:    You develop redness or irritation at your IV site.   You have a fever, chills, or a headache that does not go away.   Your pee (urine) is darker than normal.   Your urine turns:    Pink.    Red.    Brown.   The white part of your eye turns yellow (jaundice).   You feel weak after doing your normal activities.  GET HELP RIGHT AWAY IF:    You have trouble breathing.   You have fever and chills and you also have:    Anxiety.    Chest or back pain.    Flushed or pink skin.    Clammy or sweaty skin.    A fast heartbeat.    A sick feeling in your stomach (nausea).     This information is not intended to replace advice given to you by your health care provider. Make sure you discuss any questions you have with your health care provider.     Document Released: 01/20/2014 Document Reviewed: 01/20/2014  Elsevier Interactive Patient Education 2016 [employer]vier Inc.

## 2015-09-17 LAB — TYPE AND SCREEN
ABO/RH(D): O POS
Antibody Screen: NEGATIVE
Unit division: 0
Unit division: 0

## 2015-09-27 ENCOUNTER — Other Ambulatory Visit: Payer: Medicare Other

## 2015-09-27 ENCOUNTER — Ambulatory Visit (HOSPITAL_COMMUNITY)
Admission: RE | Admit: 2015-09-27 | Discharge: 2015-09-27 | Disposition: A | Discharge status: Home / Self Care | Payer: Medicare Other | Source: Ambulatory Visit | Attending: Oncology | Admitting: Oncology

## 2015-09-27 ENCOUNTER — Other Ambulatory Visit: Payer: Self-pay | Admitting: Medical Oncology

## 2015-09-27 ENCOUNTER — Ambulatory Visit (HOSPITAL_BASED_OUTPATIENT_CLINIC_OR_DEPARTMENT_OTHER): Payer: Medicare Other

## 2015-09-27 ENCOUNTER — Ambulatory Visit: Payer: Medicare Other

## 2015-09-27 ENCOUNTER — Other Ambulatory Visit: Payer: Self-pay | Admitting: *Deleted

## 2015-09-27 VITALS — BP 128/71 | HR 74 | Temp 97.8°F | Resp 18

## 2015-09-27 DIAGNOSIS — N189 Chronic kidney disease, unspecified: Secondary | ICD-10-CM | POA: Diagnosis not present

## 2015-09-27 DIAGNOSIS — D631 Anemia in chronic kidney disease: Secondary | ICD-10-CM

## 2015-09-27 DIAGNOSIS — D509 Iron deficiency anemia, unspecified: Secondary | ICD-10-CM | POA: Diagnosis not present

## 2015-09-27 DIAGNOSIS — D649 Anemia, unspecified: Secondary | ICD-10-CM

## 2015-09-27 LAB — COMPREHENSIVE METABOLIC PANEL
ALBUMIN: 2.8 g/dL — AB (ref 3.5–5.0)
ALK PHOS: 49 U/L (ref 40–150)
ALT: 12 U/L (ref 0–55)
ANION GAP: 8 meq/L (ref 3–11)
AST: 18 U/L (ref 5–34)
BUN: 28.3 mg/dL — ABNORMAL HIGH (ref 7.0–26.0)
CALCIUM: 9.1 mg/dL (ref 8.4–10.4)
CHLORIDE: 111 meq/L — AB (ref 98–109)
CO2: 20 mEq/L — ABNORMAL LOW (ref 22–29)
CREATININE: 2.1 mg/dL — AB (ref 0.7–1.3)
EGFR: 36 mL/min/{1.73_m2} — ABNORMAL LOW (ref >90)
Glucose: 106 mg/dl (ref 70–140)
POTASSIUM: 4.7 meq/L (ref 3.5–5.1)
Sodium: 139 mEq/L (ref 136–145)
Total Bilirubin: 0.36 mg/dL (ref 0.20–1.20)
Total Protein: 6.5 g/dL (ref 6.4–8.3)

## 2015-09-27 LAB — IRON AND TIBC
%SAT: 7 % — ABNORMAL LOW (ref 20–55)
Iron: 27 ug/dL — ABNORMAL LOW (ref 42–163)
TIBC: 364 ug/dL (ref 202–409)
UIBC: 337 ug/dL (ref 117–376)

## 2015-09-27 LAB — CBC WITH DIFFERENTIAL/PLATELET
BASO%: 0.2 % (ref 0.0–2.0)
Basophils Absolute: 0 10*3/uL (ref 0.0–0.1)
EOS%: 5 % (ref 0.0–7.0)
Eosinophils Absolute: 0.3 10*3/uL (ref 0.0–0.5)
HCT: 22.9 % — ABNORMAL LOW (ref 38.4–49.9)
HGB: 6.9 g/dL — CL (ref 13.0–17.1)
LYMPH%: 13.5 % — AB (ref 14.0–49.0)
MCH: 27.4 pg (ref 27.2–33.4)
MCHC: 30.1 g/dL — ABNORMAL LOW (ref 32.0–36.0)
MCV: 90.9 fL (ref 79.3–98.0)
MONO#: 0.6 10*3/uL (ref 0.1–0.9)
MONO%: 10.7 % (ref 0.0–14.0)
NEUT%: 70.6 % (ref 39.0–75.0)
NEUTROS ABS: 4 10*3/uL (ref 1.5–6.5)
Platelets: 257 10*3/uL (ref 140–400)
RBC: 2.52 10*6/uL — AB (ref 4.20–5.82)
RDW: 18.8 % — ABNORMAL HIGH (ref 11.0–14.6)
WBC: 5.6 10*3/uL (ref 4.0–10.3)
lymph#: 0.8 10*3/uL — ABNORMAL LOW (ref 0.9–3.3)

## 2015-09-27 LAB — FERRITIN: FERRITIN: 16 ng/mL — AB (ref 22–316)

## 2015-09-27 MED ORDER — DARBEPOETIN ALFA 300 MCG/0.6ML IJ SOSY: 300.0000 ug | PREFILLED_SYRINGE | Freq: Once | INTRAMUSCULAR | Status: DC

## 2015-09-27 MED ORDER — SODIUM CHLORIDE 0.9 % IV SOLN
Freq: Once | INTRAVENOUS | Status: AC
  Administered 2015-09-27: 09:00:00 via INTRAVENOUS

## 2015-09-27 MED ORDER — SODIUM CHLORIDE 0.9 % IV SOLN
510.0000 mg | Freq: Once | INTRAVENOUS | Status: AC
  Administered 2015-09-27: 510 mg via INTRAVENOUS
  Filled 2015-09-27: qty 17

## 2015-09-27 NOTE — Progress Notes (Signed)
Aranesp injection given in Infusion Room

## 2015-09-27 NOTE — Patient Instructions (Signed)

## 2015-09-27 NOTE — Progress Notes (Signed)
Pt arrived today with Hgb 6.9. Experiencing increased fatigue but no CP or SOB. Dr. Eldridge Scot wants to give Feraheme today and 2 units of blood ordered for Saturday. Aranesp postponed b/c it cannot be given with feraheme per Dr. Alen Blew and pharmacy. Pt VSS. Pt agreed to plan and willing to come in on Saturday for 2U of blood and Aranesp.

## 2015-09-28 LAB — PREPARE RBC (CROSSMATCH)

## 2015-09-29 ENCOUNTER — Ambulatory Visit: Payer: Medicare Other

## 2015-09-29 DIAGNOSIS — D649 Anemia, unspecified: Secondary | ICD-10-CM

## 2015-09-29 MED ORDER — ACETAMINOPHEN 325 MG PO TABS
650.0000 mg | ORAL_TABLET | Freq: Once | ORAL | Status: AC
  Administered 2015-09-29: 650 mg via ORAL

## 2015-09-29 MED ORDER — SODIUM CHLORIDE 0.9 % IV SOLN
250.0000 mL | Freq: Once | INTRAVENOUS | Status: AC
  Administered 2015-09-29: 250 mL via INTRAVENOUS

## 2015-09-29 MED ORDER — ACETAMINOPHEN 325 MG PO TABS
ORAL_TABLET | ORAL | Status: AC
  Filled 2015-09-29: qty 2

## 2015-09-29 MED ORDER — DIPHENHYDRAMINE HCL 25 MG PO CAPS
25.0000 mg | ORAL_CAPSULE | Freq: Once | ORAL | Status: AC
  Administered 2015-09-29: 25 mg via ORAL

## 2015-09-29 MED ORDER — DIPHENHYDRAMINE HCL 25 MG PO CAPS
ORAL_CAPSULE | ORAL | Status: AC
  Filled 2015-09-29: qty 1

## 2015-09-29 NOTE — Patient Instructions (Signed)

## 2015-09-30 LAB — TYPE AND SCREEN
ABO/RH(D): O POS
Antibody Screen: NEGATIVE
UNIT DIVISION: 0
Unit division: 0

## 2015-10-02 LAB — MULTIPLE MYELOMA PANEL, SERUM
ALBUMIN SERPL ELPH-MCNC: 3 g/dL (ref 2.9–4.4)
ALPHA 1: 0.3 g/dL (ref 0.0–0.4)
Albumin/Glob SerPl: 1.1 (ref 0.7–1.7)
Alpha2 Glob SerPl Elph-Mcnc: 0.6 g/dL (ref 0.4–1.0)
B-GLOBULIN SERPL ELPH-MCNC: 1.1 g/dL (ref 0.7–1.3)
GAMMA GLOB SERPL ELPH-MCNC: 0.9 g/dL (ref 0.4–1.8)
GLOBULIN, TOTAL: 2.9 g/dL (ref 2.2–3.9)
IgA, Qn, Serum: 286 mg/dL (ref 61–437)
IgG, Qn, Serum: 1042 mg/dL (ref 700–1600)
IgM, Qn, Serum: 26 mg/dL (ref 20–172)
M PROTEIN SERPL ELPH-MCNC: 0.2 g/dL — AB
Total Protein: 5.9 g/dL — ABNORMAL LOW (ref 6.0–8.5)

## 2015-10-04 ENCOUNTER — Ambulatory Visit (HOSPITAL_BASED_OUTPATIENT_CLINIC_OR_DEPARTMENT_OTHER): Payer: Medicare Other

## 2015-10-04 VITALS — BP 113/64 | HR 61 | Temp 98.5°F | Resp 18

## 2015-10-04 DIAGNOSIS — D631 Anemia in chronic kidney disease: Secondary | ICD-10-CM | POA: Diagnosis not present

## 2015-10-04 DIAGNOSIS — N189 Chronic kidney disease, unspecified: Secondary | ICD-10-CM

## 2015-10-04 MED ORDER — SODIUM CHLORIDE 0.9 % IV SOLN
510.0000 mg | Freq: Once | INTRAVENOUS | Status: AC
  Administered 2015-10-04: 510 mg via INTRAVENOUS
  Filled 2015-10-04: qty 17

## 2015-10-04 MED ORDER — SODIUM CHLORIDE 0.9 % IV SOLN
Freq: Once | INTRAVENOUS | Status: AC
  Administered 2015-10-04: 09:00:00 via INTRAVENOUS

## 2015-10-04 NOTE — Patient Instructions (Signed)

## 2015-10-11 ENCOUNTER — Other Ambulatory Visit (HOSPITAL_BASED_OUTPATIENT_CLINIC_OR_DEPARTMENT_OTHER): Payer: Medicare Other

## 2015-10-11 ENCOUNTER — Ambulatory Visit (HOSPITAL_BASED_OUTPATIENT_CLINIC_OR_DEPARTMENT_OTHER): Payer: Medicare Other

## 2015-10-11 VITALS — BP 133/73 | HR 79 | Temp 98.3°F | Resp 18

## 2015-10-11 DIAGNOSIS — N189 Chronic kidney disease, unspecified: Secondary | ICD-10-CM | POA: Diagnosis not present

## 2015-10-11 DIAGNOSIS — D631 Anemia in chronic kidney disease: Secondary | ICD-10-CM | POA: Diagnosis not present

## 2015-10-11 LAB — CBC WITH DIFFERENTIAL/PLATELET
BASO%: 0.4 % (ref 0.0–2.0)
Basophils Absolute: 0 10*3/uL (ref 0.0–0.1)
EOS ABS: 0.3 10*3/uL (ref 0.0–0.5)
EOS%: 4.8 % (ref 0.0–7.0)
HEMATOCRIT: 25.6 % — AB (ref 38.4–49.9)
HEMOGLOBIN: 8.2 g/dL — AB (ref 13.0–17.1)
LYMPH#: 0.8 10*3/uL — AB (ref 0.9–3.3)
LYMPH%: 13.5 % — ABNORMAL LOW (ref 14.0–49.0)
MCH: 30.3 pg (ref 27.2–33.4)
MCHC: 32.1 g/dL (ref 32.0–36.0)
MCV: 94.2 fL (ref 79.3–98.0)
MONO#: 0.7 10*3/uL (ref 0.1–0.9)
MONO%: 12.5 % (ref 0.0–14.0)
NEUT%: 68.8 % (ref 39.0–75.0)
NEUTROS ABS: 4.1 10*3/uL (ref 1.5–6.5)
PLATELETS: 196 10*3/uL (ref 140–400)
RBC: 2.71 10*6/uL — ABNORMAL LOW (ref 4.20–5.82)
RDW: 23.6 % — AB (ref 11.0–14.6)
WBC: 6 10*3/uL (ref 4.0–10.3)

## 2015-10-11 MED ORDER — DARBEPOETIN ALFA 300 MCG/0.6ML IJ SOSY
300.0000 ug | PREFILLED_SYRINGE | Freq: Once | INTRAMUSCULAR | Status: AC
  Administered 2015-10-11: 300 ug via SUBCUTANEOUS
  Filled 2015-10-11: qty 0.6

## 2015-10-11 NOTE — Patient Instructions (Addendum)
Darbepoetin Alfa injection What is this medicine? DARBEPOETIN ALFA (dar be POE e tin AL fa) helps your body make more red blood cells. It is used to treat anemia caused by chronic kidney failure and chemotherapy. This medicine may be used for other purposes; ask your health care provider or pharmacist if you have questions. What should I tell my health care provider before I take this medicine? They need to know if you have any of these conditions: -blood clotting disorders or history of blood clots -cancer patient not on chemotherapy -cystic fibrosis -heart disease, such as angina, heart failure, or a history of a heart attack -hemoglobin level of 12 g/dL or greater -high blood pressure -low levels of folate, iron, or vitamin B12 -seizures -an unusual or allergic reaction to darbepoetin, erythropoietin, albumin, hamster proteins, latex, other medicines, foods, dyes, or preservatives -pregnant or trying to get pregnant -breast-feeding How should I use this medicine? This medicine is for injection into a vein or under the skin. It is usually given by a health care professional in a hospital or clinic setting. If you get this medicine at home, you will be taught how to prepare and give this medicine. Do not shake the solution before you withdraw a dose. Use exactly as directed. Take your medicine at regular intervals. Do not take your medicine more often than directed. It is important that you put your used needles and syringes in a special sharps container. Do not put them in a trash can. If you do not have a sharps container, call your pharmacist or healthcare provider to get one. Talk to your pediatrician regarding the use of this medicine in children. While this medicine may be used in children as young as 1 year for selected conditions, precautions do apply. Overdosage: If you think you have taken too much of this medicine contact a poison control center or emergency room at once. NOTE:  This medicine is only for you. Do not share this medicine with others. What if I miss a dose? If you miss a dose, take it as soon as you can. If it is almost time for your next dose, take only that dose. Do not take double or extra doses. What may interact with this medicine? Do not take this medicine with any of the following medications: -epoetin alfa This list may not describe all possible interactions. Give your health care provider a list of all the medicines, herbs, non-prescription drugs, or dietary supplements you use. Also tell them if you smoke, drink alcohol, or use illegal drugs. Some items may interact with your medicine. What should I watch for while using this medicine? Visit your prescriber or health care professional for regular checks on your progress and for the needed blood tests and blood pressure measurements. It is especially important for the doctor to make sure your hemoglobin level is in the desired range, to limit the risk of potential side effects and to give you the best benefit. Keep all appointments for any recommended tests. Check your blood pressure as directed. Ask your doctor what your blood pressure should be and when you should contact him or her. As your body makes more red blood cells, you may need to take iron, folic acid, or vitamin B supplements. Ask your doctor or health care provider which products are right for you. If you have kidney disease continue dietary restrictions, even though this medication can make you feel better. Talk with your doctor or health care professional about the   foods you eat and the vitamins that you take. What side effects may I notice from receiving this medicine? Side effects that you should report to your doctor or health care professional as soon as possible: -allergic reactions like skin rash, itching or hives, swelling of the face, lips, or tongue -breathing problems -changes in vision -chest pain -confusion, trouble speaking  or understanding -feeling faint or lightheaded, falls -high blood pressure -muscle aches or pains -pain, swelling, warmth in the leg -rapid weight gain -severe headaches -sudden numbness or weakness of the face, arm or leg -trouble walking, dizziness, loss of balance or coordination -seizures (convulsions) -swelling of the ankles, feet, hands -unusually weak or tired Side effects that usually do not require medical attention (report to your doctor or health care professional if they continue or are bothersome): -diarrhea -fever, chills (flu-like symptoms) -headaches -nausea, vomiting -redness, stinging, or swelling at site where injected This list may not describe all possible side effects. Call your doctor for medical advice about side effects. You may report side effects to FDA at 1-800-FDA-1088. Where should I keep my medicine? Keep out of the reach of children. Store in a refrigerator between 2 and 8 degrees C (36 and 46 degrees F). Do not freeze. Do not shake. Throw away any unused portion if using a single-dose vial. Throw away any unused medicine after the expiration date. NOTE: This sheet is a summary. It may not cover all possible information. If you have questions about this medicine, talk to your doctor, pharmacist, or health care provider.    2016, Elsevier/Gold Standard. (2007-12-14 10:23:57)  

## 2015-10-25 ENCOUNTER — Other Ambulatory Visit (HOSPITAL_BASED_OUTPATIENT_CLINIC_OR_DEPARTMENT_OTHER): Payer: Medicare Other

## 2015-10-25 ENCOUNTER — Telehealth: Payer: Self-pay | Admitting: Oncology

## 2015-10-25 ENCOUNTER — Ambulatory Visit (HOSPITAL_BASED_OUTPATIENT_CLINIC_OR_DEPARTMENT_OTHER): Payer: Medicare Other | Admitting: Oncology

## 2015-10-25 ENCOUNTER — Ambulatory Visit (HOSPITAL_BASED_OUTPATIENT_CLINIC_OR_DEPARTMENT_OTHER): Payer: Medicare Other

## 2015-10-25 VITALS — BP 142/68 | HR 73 | Temp 98.8°F | Resp 18 | Ht 74.0 in | Wt 272.7 lb

## 2015-10-25 DIAGNOSIS — Z8546 Personal history of malignant neoplasm of prostate: Secondary | ICD-10-CM | POA: Diagnosis not present

## 2015-10-25 DIAGNOSIS — N189 Chronic kidney disease, unspecified: Secondary | ICD-10-CM

## 2015-10-25 DIAGNOSIS — D631 Anemia in chronic kidney disease: Secondary | ICD-10-CM

## 2015-10-25 DIAGNOSIS — D509 Iron deficiency anemia, unspecified: Secondary | ICD-10-CM

## 2015-10-25 LAB — CBC WITH DIFFERENTIAL/PLATELET
BASO%: 0.1 % (ref 0.0–2.0)
BASOS ABS: 0 10*3/uL (ref 0.0–0.1)
EOS ABS: 0.4 10*3/uL (ref 0.0–0.5)
EOS%: 4.8 % (ref 0.0–7.0)
HCT: 27.5 % — ABNORMAL LOW (ref 38.4–49.9)
HEMOGLOBIN: 8.4 g/dL — AB (ref 13.0–17.1)
LYMPH%: 11.6 % — AB (ref 14.0–49.0)
MCH: 29.6 pg (ref 27.2–33.4)
MCHC: 30.5 g/dL — ABNORMAL LOW (ref 32.0–36.0)
MCV: 96.8 fL (ref 79.3–98.0)
MONO#: 0.8 10*3/uL (ref 0.1–0.9)
MONO%: 10.1 % (ref 0.0–14.0)
NEUT#: 5.5 10*3/uL (ref 1.5–6.5)
NEUT%: 73.4 % (ref 39.0–75.0)
PLATELETS: 216 10*3/uL (ref 140–400)
RBC: 2.84 10*6/uL — ABNORMAL LOW (ref 4.20–5.82)
RDW: 18.8 % — AB (ref 11.0–14.6)
WBC: 7.4 10*3/uL (ref 4.0–10.3)
lymph#: 0.9 10*3/uL (ref 0.9–3.3)

## 2015-10-25 MED ORDER — DARBEPOETIN ALFA 300 MCG/0.6ML IJ SOSY
300.0000 ug | PREFILLED_SYRINGE | Freq: Once | INTRAMUSCULAR | Status: AC
  Administered 2015-10-25: 300 ug via SUBCUTANEOUS
  Filled 2015-10-25: qty 0.6

## 2015-10-25 NOTE — Patient Instructions (Signed)
Darbepoetin Alfa injection What is this medicine? DARBEPOETIN ALFA (dar be POE e tin AL fa) helps your body make more red blood cells. It is used to treat anemia caused by chronic kidney failure and chemotherapy. This medicine may be used for other purposes; ask your health care provider or pharmacist if you have questions. What should I tell my health care provider before I take this medicine? They need to know if you have any of these conditions: -blood clotting disorders or history of blood clots -cancer patient not on chemotherapy -cystic fibrosis -heart disease, such as angina, heart failure, or a history of a heart attack -hemoglobin level of 12 g/dL or greater -high blood pressure -low levels of folate, iron, or vitamin B12 -seizures -an unusual or allergic reaction to darbepoetin, erythropoietin, albumin, hamster proteins, latex, other medicines, foods, dyes, or preservatives -pregnant or trying to get pregnant -breast-feeding How should I use this medicine? This medicine is for injection into a vein or under the skin. It is usually given by a health care professional in a hospital or clinic setting. If you get this medicine at home, you will be taught how to prepare and give this medicine. Do not shake the solution before you withdraw a dose. Use exactly as directed. Take your medicine at regular intervals. Do not take your medicine more often than directed. It is important that you put your used needles and syringes in a special sharps container. Do not put them in a trash can. If you do not have a sharps container, call your pharmacist or healthcare provider to get one. Talk to your pediatrician regarding the use of this medicine in children. While this medicine may be used in children as young as 1 year for selected conditions, precautions do apply. Overdosage: If you think you have taken too much of this medicine contact a poison control center or emergency room at once. NOTE:  This medicine is only for you. Do not share this medicine with others. What if I miss a dose? If you miss a dose, take it as soon as you can. If it is almost time for your next dose, take only that dose. Do not take double or extra doses. What may interact with this medicine? Do not take this medicine with any of the following medications: -epoetin alfa This list may not describe all possible interactions. Give your health care provider a list of all the medicines, herbs, non-prescription drugs, or dietary supplements you use. Also tell them if you smoke, drink alcohol, or use illegal drugs. Some items may interact with your medicine. What should I watch for while using this medicine? Visit your prescriber or health care professional for regular checks on your progress and for the needed blood tests and blood pressure measurements. It is especially important for the doctor to make sure your hemoglobin level is in the desired range, to limit the risk of potential side effects and to give you the best benefit. Keep all appointments for any recommended tests. Check your blood pressure as directed. Ask your doctor what your blood pressure should be and when you should contact him or her. As your body makes more red blood cells, you may need to take iron, folic acid, or vitamin B supplements. Ask your doctor or health care provider which products are right for you. If you have kidney disease continue dietary restrictions, even though this medication can make you feel better. Talk with your doctor or health care professional about the   foods you eat and the vitamins that you take. What side effects may I notice from receiving this medicine? Side effects that you should report to your doctor or health care professional as soon as possible: -allergic reactions like skin rash, itching or hives, swelling of the face, lips, or tongue -breathing problems -changes in vision -chest pain -confusion, trouble speaking  or understanding -feeling faint or lightheaded, falls -high blood pressure -muscle aches or pains -pain, swelling, warmth in the leg -rapid weight gain -severe headaches -sudden numbness or weakness of the face, arm or leg -trouble walking, dizziness, loss of balance or coordination -seizures (convulsions) -swelling of the ankles, feet, hands -unusually weak or tired Side effects that usually do not require medical attention (report to your doctor or health care professional if they continue or are bothersome): -diarrhea -fever, chills (flu-like symptoms) -headaches -nausea, vomiting -redness, stinging, or swelling at site where injected This list may not describe all possible side effects. Call your doctor for medical advice about side effects. You may report side effects to FDA at 1-800-FDA-1088. Where should I keep my medicine? Keep out of the reach of children. Store in a refrigerator between 2 and 8 degrees C (36 and 46 degrees F). Do not freeze. Do not shake. Throw away any unused portion if using a single-dose vial. Throw away any unused medicine after the expiration date. NOTE: This sheet is a summary. It may not cover all possible information. If you have questions about this medicine, talk to your doctor, pharmacist, or health care provider.    2016, Elsevier/Gold Standard. (2007-12-14 10:23:57)  

## 2015-10-25 NOTE — Telephone Encounter (Signed)
Gv pt appts thru 12/20/15.

## 2015-10-25 NOTE — Progress Notes (Signed)
Hematology and Oncology Follow Up Visit  Jacob [name] [id number] 1945/11/20 70 y.o. 10/25/2015 10:02 AM Jacob Cedar, MD   Principle Diagnosis: 70 year old gentleman with normocytic, normochromic anemia diagnosed March 2017. His anemia is due to renal insufficiency with creatinine of 2.9 creatinine clearance 44 mL/m.  He also was diagnosed with prostate cancer in 2013 with a Gleason score 3+4 = 7 and a PSA 5.66 stage TIc.   Prior Therapy:  He is status post radiation therapy for definitive treatment for his prostate cancer. Therapy concluded in December 2013. He is status post packed red cell transfusions in April 2017. He is status post bone marrow biopsy in April 2017. Results did not show any plasma cell disorder or myelodysplasia. He is status post IV iron infusion completed in April 2017. This was repeated in September 2017.   Current therapy: Aranesp 300 g injection every 2 weeks to get his hemoglobin to 10.  Interim History: Jacob [name] presents today for a follow-up visit. Since the last visit, he reports feeling reasonably well. He received packed red cell transfusion 4 weeks ago that was followed by IV iron infusion and his hemoglobin have not dropped since. His hemoglobin maintained around 8.4 range without any other symptoms. He denied any chest pain, dyspnea on exertion or genitourinary bleeding. He denied any hematochezia or melena.  He continues to be on Aranesp without any major complications. He is not able to work but able to attend to her activities of daily living is able to drive short distances. He reports he is able to work further than it has been in the future.  He does not report any headaches, blurry vision, syncope or seizures. He does not report any fevers, chills, sweats or weight loss. Does not report any chest pain, palpitation, orthopnea or leg edema. He does not report any cough, wheezing or hemoptysis. He does not report  any nausea, vomiting, abdominal pain, hematochezia or melena. He does not report any frequency, urgency or hesitancy. He does not report any skeletal complaints of arthralgias or myalgias. Remaining review of systems unremarkable.   Medications: I have reviewed the patient's current medications.  Current Outpatient Prescriptions  Medication Sig Dispense Refill  . allopurinol (ZYLOPRIM) 300 MG tablet     . buPROPion (WELLBUTRIN XL) 300 MG 24 hr tablet     . diazepam (VALIUM) 5 MG tablet Take 5 mg by mouth at bedtime.    . furosemide (LASIX) 80 MG tablet Take 80 mg by mouth. Taking 1/2 tablet    . gabapentin (NEURONTIN) 800 MG tablet Take 800 mg by mouth at bedtime.     Marland Kitchen LOSARTAN POTASSIUM-HCTZ PO Take by mouth daily.     . Melatonin 10 MG TABS Take 1 tablet by mouth daily.    . Nebivolol HCl (BYSTOLIC) 20 MG TABS Take 20 mg by mouth daily.     Marland Kitchen OMEPRAZOLE PO Take 20 mg by mouth daily.     . tamsulosin (FLOMAX) 0.4 MG CAPS capsule TAKE 1 CAPSULE BY MOUTH DAILY 30 capsule 2  . traZODone (DESYREL) 50 MG tablet     . valsartan (DIOVAN) 320 MG tablet Take 320 mg by mouth daily.    . Verapamil HCl CR 300 MG CP24 Take 1 tablet by mouth daily.     No current facility-administered medications for this visit.      Allergies: No Known Allergies  Past Medical History, Surgical history, Social history, and Family History were reviewed and updated.  Physical Exam: Blood pressure (!) 142/68, pulse 73, temperature 98.8 F (37.1 C), temperature source Oral, resp. rate 18, height _0  (1.88 m), weight 272 lb 11.2 oz (123.7 kg), SpO2 98 %. ECOG: 1 General appearance: Well-appearing gentleman without distress. Head: Normocephalic, without obvious abnormality no oral thrush noted. Neck: no adenopathy Lymph nodes: Cervical, supraclavicular, and axillary nodes normal. Heart:regular rate and rhythm, S1, S2 normal, no murmur, click, rub or gallop Lung:chest clear, no wheezing, rales, normal symmetric  air entry Abdomin: soft, non-tender, without masses or organomegaly no rebound or guarding. EXT:no erythema, induration, or nodules   Lab Results: Lab Results  Component Value Date   WBC 7.4 10/25/2015   HGB 8.4 (L) 10/25/2015   HCT 27.5 (L) 10/25/2015   MCV 96.8 10/25/2015   PLT 216 10/25/2015     Chemistry      Component Value Date/Time   NA 139 09/27/2015 0834   K 4.7 09/27/2015 0834   CL 105 11/08/2012 1649   CO2 20 (L) 09/27/2015 0834   BUN 28.3 (H) 09/27/2015 0834   CREATININE 2.1 (H) 09/27/2015 0834      Component Value Date/Time   CALCIUM 9.1 09/27/2015 0834   ALKPHOS 49 09/27/2015 0834   AST 18 09/27/2015 0834   ALT 12 09/27/2015 0834   BILITOT 0.36 09/27/2015 0834        Impression and Plan:  70 year old woman with the following issues:  1. Normocytic, normochromic anemia presented initially with a hemoglobin of 9.2 now has drifted down to 6.0. His MCV is normal at 97 but does have an elevated RDW. His anemia is related to renal insufficiency as well as iron deficiency.  His workup has been completed at this time and his bone marrow biopsy did not show any evidence of myelodysplasia and showed slightly increase of his plasma cells suggestive of plasma cell disorder.  He is currently receiving Aranesp every 2 weeks to keep his hemoglobin above 10. We will also receive intravenous iron intermittently. He does require packet cell transfusion periodically if his hemoglobin drops below 8.  2. Prostate cancer: He is status post radiation therapy which can contribute to bone marrow suppression and contribute to his anemia.   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 His last protein studies obtained in September 2017 showed no major changes and will be repeated annually.  4. Renal insufficiency: Followed by his primary care provider and my require nephrology follow-up in the future.  5. Follow-up: He will  receive injections every 2 weeks and I will assess his response in 8 weeks.   [facility], MD 10/12/201710:02 AM

## 2015-10-26 ENCOUNTER — Observation Stay (HOSPITAL_COMMUNITY)
Admission: EM | Admit: 2015-10-26 | Discharge: 2015-10-29 | Disposition: A | Discharge status: Home / Self Care | Payer: Medicare Other | Attending: Family Medicine | Admitting: Family Medicine

## 2015-10-26 ENCOUNTER — Encounter (HOSPITAL_COMMUNITY): Payer: Self-pay

## 2015-10-26 DIAGNOSIS — Z86010 Personal history of colonic polyps: Secondary | ICD-10-CM | POA: Diagnosis not present

## 2015-10-26 DIAGNOSIS — Z923 Personal history of irradiation: Secondary | ICD-10-CM | POA: Diagnosis not present

## 2015-10-26 DIAGNOSIS — M1990 Unspecified osteoarthritis, unspecified site: Secondary | ICD-10-CM | POA: Diagnosis not present

## 2015-10-26 DIAGNOSIS — D509 Iron deficiency anemia, unspecified: Secondary | ICD-10-CM | POA: Diagnosis not present

## 2015-10-26 DIAGNOSIS — M109 Gout, unspecified: Secondary | ICD-10-CM | POA: Insufficient documentation

## 2015-10-26 DIAGNOSIS — Z8711 Personal history of peptic ulcer disease: Secondary | ICD-10-CM | POA: Diagnosis not present

## 2015-10-26 DIAGNOSIS — F419 Anxiety disorder, unspecified: Secondary | ICD-10-CM | POA: Diagnosis not present

## 2015-10-26 DIAGNOSIS — N529 Male erectile dysfunction, unspecified: Secondary | ICD-10-CM | POA: Insufficient documentation

## 2015-10-26 DIAGNOSIS — K922 Gastrointestinal hemorrhage, unspecified: Secondary | ICD-10-CM | POA: Diagnosis present

## 2015-10-26 DIAGNOSIS — Z23 Encounter for immunization: Secondary | ICD-10-CM | POA: Insufficient documentation

## 2015-10-26 DIAGNOSIS — K279 Peptic ulcer, site unspecified, unspecified as acute or chronic, without hemorrhage or perforation: Secondary | ICD-10-CM | POA: Insufficient documentation

## 2015-10-26 DIAGNOSIS — C163 Malignant neoplasm of pyloric antrum: Principal | ICD-10-CM | POA: Insufficient documentation

## 2015-10-26 DIAGNOSIS — N401 Enlarged prostate with lower urinary tract symptoms: Secondary | ICD-10-CM | POA: Diagnosis not present

## 2015-10-26 DIAGNOSIS — I77811 Abdominal aortic ectasia: Secondary | ICD-10-CM | POA: Insufficient documentation

## 2015-10-26 DIAGNOSIS — C61 Malignant neoplasm of prostate: Secondary | ICD-10-CM | POA: Diagnosis present

## 2015-10-26 DIAGNOSIS — Z87891 Personal history of nicotine dependence: Secondary | ICD-10-CM | POA: Insufficient documentation

## 2015-10-26 DIAGNOSIS — I700 Atherosclerosis of aorta: Secondary | ICD-10-CM | POA: Insufficient documentation

## 2015-10-26 DIAGNOSIS — Z8546 Personal history of malignant neoplasm of prostate: Secondary | ICD-10-CM | POA: Diagnosis not present

## 2015-10-26 DIAGNOSIS — N183 Chronic kidney disease, stage 3 (moderate): Secondary | ICD-10-CM | POA: Insufficient documentation

## 2015-10-26 DIAGNOSIS — K3189 Other diseases of stomach and duodenum: Secondary | ICD-10-CM

## 2015-10-26 DIAGNOSIS — F329 Major depressive disorder, single episode, unspecified: Secondary | ICD-10-CM | POA: Diagnosis not present

## 2015-10-26 DIAGNOSIS — K5909 Other constipation: Secondary | ICD-10-CM | POA: Insufficient documentation

## 2015-10-26 DIAGNOSIS — M47816 Spondylosis without myelopathy or radiculopathy, lumbar region: Secondary | ICD-10-CM | POA: Diagnosis not present

## 2015-10-26 DIAGNOSIS — D631 Anemia in chronic kidney disease: Secondary | ICD-10-CM

## 2015-10-26 DIAGNOSIS — E7800 Pure hypercholesterolemia, unspecified: Secondary | ICD-10-CM | POA: Diagnosis not present

## 2015-10-26 DIAGNOSIS — N189 Chronic kidney disease, unspecified: Secondary | ICD-10-CM

## 2015-10-26 DIAGNOSIS — I129 Hypertensive chronic kidney disease with stage 1 through stage 4 chronic kidney disease, or unspecified chronic kidney disease: Secondary | ICD-10-CM | POA: Insufficient documentation

## 2015-10-26 DIAGNOSIS — I10 Essential (primary) hypertension: Secondary | ICD-10-CM | POA: Diagnosis present

## 2015-10-26 HISTORY — DX: Peptic ulcer, site unspecified, unspecified as acute or chronic, without hemorrhage or perforation: K27.9

## 2015-10-26 HISTORY — DX: Epigastric pain: R10.13

## 2015-10-26 LAB — CBC WITH DIFFERENTIAL/PLATELET
Basophils Absolute: 0 10*3/uL (ref 0.0–0.1)
Basophils Relative: 0 %
Eosinophils Absolute: 0.2 10*3/uL (ref 0.0–0.7)
Eosinophils Relative: 3 %
HCT: 24.2 % — ABNORMAL LOW (ref 39.0–52.0)
Hemoglobin: 7.8 g/dL — ABNORMAL LOW (ref 13.0–17.0)
Lymphocytes Relative: 12 %
Lymphs Abs: 0.8 10*3/uL (ref 0.7–4.0)
MCH: 30.4 pg (ref 26.0–34.0)
MCHC: 32.2 g/dL (ref 30.0–36.0)
MCV: 94.2 fL (ref 78.0–100.0)
Monocytes Absolute: 0.5 10*3/uL (ref 0.1–1.0)
Monocytes Relative: 8 %
Neutro Abs: 5 10*3/uL (ref 1.7–7.7)
Neutrophils Relative %: 77 %
Platelets: 228 10*3/uL (ref 150–400)
RBC: 2.57 MIL/uL — ABNORMAL LOW (ref 4.22–5.81)
RDW: 18.3 % — ABNORMAL HIGH (ref 11.5–15.5)
WBC: 6.5 10*3/uL (ref 4.0–10.5)

## 2015-10-26 LAB — BASIC METABOLIC PANEL
Anion gap: 4 — ABNORMAL LOW (ref 5–15)
BUN: 43 mg/dL — ABNORMAL HIGH (ref 6–20)
CO2: 24 mmol/L (ref 22–32)
Calcium: 9 mg/dL (ref 8.9–10.3)
Chloride: 108 mmol/L (ref 101–111)
Creatinine, Ser: 1.86 mg/dL — ABNORMAL HIGH (ref 0.61–1.24)
GFR calc Af Amer: 41 mL/min — ABNORMAL LOW (ref >60)
GFR calc non Af Amer: 35 mL/min — ABNORMAL LOW (ref >60)
Glucose, Bld: 108 mg/dL — ABNORMAL HIGH (ref 65–99)
Potassium: 4.8 mmol/L (ref 3.5–5.1)
Sodium: 136 mmol/L (ref 135–145)

## 2015-10-26 LAB — HEMOGLOBIN: Hemoglobin: 7.2 g/dL — ABNORMAL LOW (ref 13.0–17.0)

## 2015-10-26 LAB — POC OCCULT BLOOD, ED: Fecal Occult Bld: POSITIVE — AB

## 2015-10-26 LAB — HEMATOCRIT: HEMATOCRIT: 23.2 % — AB (ref 39.0–52.0)

## 2015-10-26 MED ORDER — NEBIVOLOL HCL 20 MG PO TABS
20.0000 mg | ORAL_TABLET | Freq: Every day | ORAL | Status: DC
Start: 2015-10-26 — End: 2015-10-26

## 2015-10-26 MED ORDER — LOPERAMIDE HCL 2 MG PO CAPS
2.0000 mg | ORAL_CAPSULE | Freq: Once | ORAL | Status: AC
  Administered 2015-10-26: 2 mg via ORAL
  Filled 2015-10-26: qty 1

## 2015-10-26 MED ORDER — TAMSULOSIN HCL 0.4 MG PO CAPS
0.4000 mg | ORAL_CAPSULE | Freq: Every day | ORAL | Status: DC
  Administered 2015-10-27 – 2015-10-29 (×3): 0.4 mg via ORAL
  Filled 2015-10-26 (×3): qty 1

## 2015-10-26 MED ORDER — GLUCERNA SHAKE PO LIQD: 237.0000 mL | Freq: Once | ORAL | Status: DC

## 2015-10-26 MED ORDER — DEXTROMETHORPHAN POLISTIREX ER 30 MG/5ML PO SUER
30.0000 mg | Freq: Four times a day (QID) | ORAL | Status: DC | PRN
  Filled 2015-10-26: qty 5

## 2015-10-26 MED ORDER — BUPROPION HCL ER (XL) 150 MG PO TB24
300.0000 mg | ORAL_TABLET | Freq: Every day | ORAL | Status: DC
  Administered 2015-10-27 – 2015-10-29 (×3): 300 mg via ORAL
  Filled 2015-10-26 (×3): qty 2

## 2015-10-26 MED ORDER — TRAZODONE HCL 50 MG PO TABS
50.0000 mg | ORAL_TABLET | Freq: Every day | ORAL | Status: DC
Start: 2015-10-26 — End: 2015-10-29
  Administered 2015-10-26 – 2015-10-28 (×3): 50 mg via ORAL
  Filled 2015-10-26 (×3): qty 1

## 2015-10-26 MED ORDER — PANTOPRAZOLE SODIUM 40 MG PO TBEC
40.0000 mg | DELAYED_RELEASE_TABLET | Freq: Two times a day (BID) | ORAL | Status: DC
  Administered 2015-10-26 – 2015-10-27 (×2): 40 mg via ORAL
  Filled 2015-10-26 (×2): qty 1

## 2015-10-26 MED ORDER — SODIUM CHLORIDE 0.9 % IV SOLN: 250.0000 mL | INTRAVENOUS | Status: DC | PRN

## 2015-10-26 MED ORDER — ALLOPURINOL 300 MG PO TABS
300.0000 mg | ORAL_TABLET | Freq: Every day | ORAL | Status: DC
  Administered 2015-10-27 – 2015-10-29 (×3): 300 mg via ORAL
  Filled 2015-10-26 (×3): qty 1

## 2015-10-26 MED ORDER — SODIUM CHLORIDE 0.9% FLUSH: 3.0000 mL | INTRAVENOUS | Status: DC | PRN

## 2015-10-26 MED ORDER — GLUCERNA SHAKE PO LIQD
237.0000 mL | Freq: Once | ORAL | Status: AC
  Administered 2015-10-26: 237 mL via ORAL
  Filled 2015-10-26: qty 237

## 2015-10-26 MED ORDER — PANTOPRAZOLE SODIUM 40 MG PO TBEC
40.0000 mg | DELAYED_RELEASE_TABLET | Freq: Once | ORAL | Status: AC
  Administered 2015-10-26: 40 mg via ORAL
  Filled 2015-10-26: qty 1

## 2015-10-26 MED ORDER — GABAPENTIN 400 MG PO CAPS
400.0000 mg | ORAL_CAPSULE | Freq: Every day | ORAL | Status: DC
  Administered 2015-10-26 – 2015-10-28 (×3): 400 mg via ORAL
  Filled 2015-10-26: qty 1
  Filled 2015-10-26: qty 2
  Filled 2015-10-26: qty 1

## 2015-10-26 MED ORDER — NEBIVOLOL HCL 10 MG PO TABS
20.0000 mg | ORAL_TABLET | Freq: Every day | ORAL | Status: DC
  Administered 2015-10-27 – 2015-10-29 (×3): 20 mg via ORAL
  Filled 2015-10-26 (×3): qty 2

## 2015-10-26 MED ORDER — BISMUTH SUBSALICYLATE 262 MG/15ML PO SUSP: 30.0000 mL | Freq: Four times a day (QID) | ORAL | Status: DC | PRN

## 2015-10-26 MED ORDER — SODIUM CHLORIDE 0.9% FLUSH
3.0000 mL | Freq: Two times a day (BID) | INTRAVENOUS | Status: DC
  Administered 2015-10-27: 3 mL via INTRAVENOUS

## 2015-10-26 MED ORDER — VERAPAMIL HCL ER 180 MG PO TBCR
300.0000 mg | EXTENDED_RELEASE_TABLET | Freq: Every day | ORAL | Status: DC
  Administered 2015-10-26 – 2015-10-28 (×3): 300 mg via ORAL
  Filled 2015-10-26 (×3): qty 1

## 2015-10-26 MED ORDER — VERAPAMIL HCL ER 300 MG PO CP24: 300.0000 mg | ORAL_CAPSULE | Freq: Every day | ORAL | Status: DC

## 2015-10-26 MED ORDER — LATANOPROST 0.005 % OP SOLN
1.0000 [drp] | Freq: Every day | OPHTHALMIC | Status: DC
  Administered 2015-10-26 – 2015-10-28 (×3): 1 [drp] via OPHTHALMIC
  Filled 2015-10-26: qty 2.5

## 2015-10-26 MED ORDER — MELATONIN 10 MG PO TABS: 1.0000 | ORAL_TABLET | Freq: Every day | ORAL | Status: DC

## 2015-10-26 MED ORDER — ACETAMINOPHEN 500 MG PO TABS
1000.0000 mg | ORAL_TABLET | Freq: Four times a day (QID) | ORAL | Status: DC | PRN
  Administered 2015-10-28: 1000 mg via ORAL
  Filled 2015-10-26: qty 2

## 2015-10-26 MED ORDER — DM-DOXYLAMINE-ACETAMINOPHEN 30-12.5-1000 MG/30ML PO LIQD: 5.0000 mL | Freq: Four times a day (QID) | ORAL | Status: DC | PRN

## 2015-10-26 MED ORDER — FUROSEMIDE 40 MG PO TABS
40.0000 mg | ORAL_TABLET | Freq: Every day | ORAL | Status: DC
  Administered 2015-10-28 – 2015-10-29 (×2): 40 mg via ORAL
  Filled 2015-10-26 (×3): qty 1

## 2015-10-26 MED ORDER — SODIUM CHLORIDE 0.9% FLUSH
3.0000 mL | Freq: Two times a day (BID) | INTRAVENOUS | Status: DC
  Administered 2015-10-26 – 2015-10-28 (×4): 3 mL via INTRAVENOUS

## 2015-10-26 MED ORDER — DIAZEPAM 5 MG PO TABS
5.0000 mg | ORAL_TABLET | Freq: Every day | ORAL | Status: DC
  Administered 2015-10-26 – 2015-10-28 (×3): 5 mg via ORAL
  Filled 2015-10-26 (×3): qty 1

## 2015-10-26 NOTE — H&P (Signed)
History and Physical    Jacob [name]:[id number] DOB: [birth year] DOA: 10/26/2015  PCP: Tivis Ringer, MD  Patient coming from:  home   Chief Complaint:   Dark stools  HPI: Jacob [name] is a 70 y.o. male with medical history significant of CKD, chronic iron def anemia, PUD comes in with one day of melanotic stools.  Pt normally has darker stools (he is on iron infusions along with aranesp with hematology) but this am he had a large very dark smelly BM then subsequent 4 more looser stools that were also dark.  For the last day he has had worsening epigastric abdominal pain,  He has h/o PUD in the past with last scoping done in 2014.  He has not vomited.  He reports on/off epigastric pain for months, relieved with tums sometimes.  He takes otc prilosec but has not taken this in about a week.  No fevers.  His hgb has been around 8 despite infusions/aranesp for several months.  He sometimes requires blood transfusions.  Pt denies any weakness, dizziness, sob.  Pt referred for admission for possible GIB.  [name] called GI on call also for consultation for possible EGD.  Review of Systems: As per HPI otherwise 10 point review of systems negative.   Past Medical History:  Diagnosis Date  . Anemia    hx iron deficiency  . Anxiety   . Arthritis    gout  . Back pain   . BPH with obstruction/lower urinary tract symptoms   . Depression   . ED (erectile dysfunction)   . Epigastric pain   . Heartburn   . History of radiation therapy 11/03/11-12/29/11   prostate  . Hypercholesterolemia   . Hypertension   . Night sweats   . Prostate cancer ([place]) 08/14/11   Adenocarcinoma,gleason:3+3=6,& 3+4=7,PSA=5.66  . PUD (peptic ulcer disease)   . Ulcer ([place])    peptic ulcer hx    Past Surgical History:  Procedure Laterality Date  . colon polyps bx  11/24/06   colon,transverse and rectosigmoid polyps:tubular adenomas and hyperplastic polyps,no high grade dysplasia or malignancy     . duodenal bx  11/24/06   benign  . gastric bx  11/24/06   chronic active gastritis,with metaplasia and focal changaes of xanthelasma  . INSERTION PROSTATE RADIATION SEED  12-29-11  . PROSTATE BIOPSY  08/14/11   Adenocarcinoma/volume=58.51cc,gleason=3+3=6 & 3+4=7  . TONSILLECTOMY     70 years old     reports that he quit smoking about 15 years ago. His smoking use included Cigarettes. He has a 45.00 pack-year smoking history. He has never used smokeless tobacco. He reports that he drinks about 3.6 oz of alcohol per week . He reports that he does not use drugs.  No Known Allergies  Family History  Problem Relation Age of Onset  . Alcohol abuse Father   . Alcohol abuse Brother 50  . Colon cancer Neg Hx     Prior to Admission medications   Medication Sig Start Date End Date Taking? Authorizing Provider  allopurinol (ZYLOPRIM) 300 MG tablet Take 300 mg by mouth daily.  05/10/15  Yes Historical Provider, MD  bismuth subsalicylate (PEPTO BISMOL) 262 MG/15ML suspension Take 30 mLs by mouth every 6 (six) hours as needed for indigestion or diarrhea or loose stools.   Yes Historical Provider, MD  buPROPion (WELLBUTRIN XL) 300 MG 24 hr tablet Take 300 mg by mouth daily.  05/10/15  Yes Historical Provider, MD  diazepam (VALIUM) 5 MG tablet Take  5 mg by mouth at bedtime.   Yes Historical Provider, MD  DM-Doxylamine-Acetaminophen (TYLENOL COUGH/SORE THROAT) 30-12.05-998 MG/30ML LIQD Take 5-10 mLs by mouth every 6 (six) hours as needed (for cough).   Yes Historical Provider, MD  feeding supplement, GLUCERNA SHAKE, (GLUCERNA SHAKE) LIQD Take 237 mLs by mouth once.   Yes Historical Provider, MD  furosemide (LASIX) 80 MG tablet Take 40 mg by mouth daily.    Yes Historical Provider, MD  gabapentin (NEURONTIN) 800 MG tablet Take 400-800 mg by mouth at bedtime.    Yes Historical Provider, MD  influenza vac recom quadrivalent (FLUBLOK) 0.5 ML injection Inject 0.5 mLs into the muscle once.   Yes Historical  Provider, MD  latanoprost (XALATAN) 0.005 % ophthalmic solution Place 1 drop into both eyes at bedtime.   Yes Historical Provider, MD  loperamide (IMODIUM A-D) 2 MG tablet Take 2 mg by mouth once.   Yes Historical Provider, MD  Melatonin 10 MG TABS Take 1 tablet by mouth at bedtime.    Yes Historical Provider, MD  Nebivolol HCl (BYSTOLIC) 20 MG TABS Take 20 mg by mouth daily.    Yes Historical Provider, MD  omeprazole (PRILOSEC) 20 MG capsule Take 20 mg by mouth daily.   Yes Historical Provider, MD  OXYGEN Inhale 2 L into the lungs at bedtime as needed (for shortness of breath).   Yes Historical Provider, MD  oxymetazoline (VICKS SINEX MOISTURIZING) 0.05 % nasal spray Place 1 spray into both nostrils 2 (two) times daily as needed for congestion.   Yes Historical Provider, MD  tamsulosin (FLOMAX) 0.4 MG CAPS capsule TAKE 1 CAPSULE BY MOUTH DAILY 06/19/14  Yes Arloa Koh, MD  traZODone (DESYREL) 50 MG tablet Take 50 mg by mouth at bedtime.  05/10/15  Yes Historical Provider, MD  valsartan (DIOVAN) 320 MG tablet Take 320 mg by mouth daily.   Yes Historical Provider, MD  Verapamil HCl CR 300 MG CP24 Take 300 mg by mouth at bedtime.  04/01/15  Yes Historical Provider, MD    Physical Exam: Vitals:   10/26/15 1507 10/26/15 1708 10/26/15 1815 10/26/15 1935  BP:  128/71 106/60 115/70  Pulse:  75 72 77  Resp:  18 18 18   Temp:      SpO2: 95% 98% 100% 100%    Constitutional: NAD, calm, comfortable Vitals:   10/26/15 1507 10/26/15 1708 10/26/15 1815 10/26/15 1935  BP:  128/71 106/60 115/70  Pulse:  75 72 77  Resp:  18 18 18   Temp:      SpO2: 95% 98% 100% 100%   Eyes: PERRL, lids and conjunctivae normal ENMT: Mucous membranes are moist. Posterior pharynx clear of any exudate or lesions.Normal dentition.  Neck: normal, supple, no masses, no thyromegaly Respiratory: clear to auscultation bilaterally, no wheezing, no crackles. Normal respiratory effort. No accessory muscle use.  Cardiovascular:  Regular rate and rhythm, no murmurs / rubs / gallops. No extremity edema. 2+ pedal pulses. No carotid bruits.  Abdomen: no tenderness, no masses palpated. No hepatosplenomegaly. Bowel sounds positive.  Musculoskeletal: no clubbing / cyanosis. No joint deformity upper and lower extremities. Good ROM, no contractures. Normal muscle tone.  Skin: no rashes, lesions, ulcers. No induration Neurologic: CN 2-12 grossly intact. Sensation intact, DTR normal. Strength 5/5 in all 4.  Psychiatric: Normal judgment and insight. Alert and oriented x 3. Normal mood.    Labs on Admission: I have personally reviewed following labs and imaging studies  CBC:  Recent Labs Lab 10/25/15 0925 10/26/15  1551  WBC 7.4 6.5  NEUTROABS 5.5 5.0  HGB 8.4* 7.8*  HCT 27.5* 24.2*  MCV 96.8 94.2  PLT 216 646   Basic Metabolic Panel:  Recent Labs Lab 10/26/15 1551  NA 136  K 4.8  CL 108  CO2 24  GLUCOSE 108*  BUN 43*  CREATININE 1.86*  CALCIUM 9.0   GFR: Estimated Creatinine Clearance: 52.4 mL/min (by C-G formula based on SCr of 1.86 mg/dL (H)).    Radiological Exams on Admission: No results found.   Assessment/Plan 70 yo male with h/o CKD/anemia comes in with melena, heme positive stools with epigastric pain and h/o PUD  Principal Problem:   R/o chronic Gastrointestinal hemorrhage- place on protonix 40mg  po bid.  GI consulted.  Keep npo after midnight for possible EGD in the am.  Serial h/h, hold off on transfusing at this time.  Pt has had no BM or evidence of active bleeding for over 6 hours since prior to arrival.   Active Problems:   Iron deficiency anemia- noted   Essential hypertension- stable   Prostate cancer ([place])- in remission   CKD - cr at baseline     DVT prophylaxis:  scds Code Status:    full Family Communication:  Son at bedside Disposition Plan:  Per day team Consults called:   GI Admission status:  admit   Yossef Gilkison A MD [facility]  If 7PM-7AM, please  contact night-coverage [email or web address] Password [email or web address]  10/26/2015, 8:13 PM

## 2015-10-26 NOTE — Progress Notes (Signed)
   Patient of Dr. Fuller Plan - being admitted with abd pain and black stools - suspected melena Not much change in Hgb  Will see tomorrow  Can keep NPO after midnight - not sure I would scope him tomorrow - we will see him when able or sooner if clinical scenario demands  [name], MD, [facility] Gastroenterology [phone] (pager) [phone] after 5 PM, weekends and holidays  10/26/2015 7:16 PM

## 2015-10-26 NOTE — ED Notes (Signed)
Bed: WA18 Expected date:  Expected time:  Means of arrival:  Comments: 

## 2015-10-26 NOTE — ED Triage Notes (Signed)
Per EMS pt c/o loose dark stools onset of 9 am today. Pt has prostate cancer and has been on medication Darbepoetin Alfa for his anemia. Side effect is diarrhea however pt states being on this medication for about 6 months but diarrhea just started today. Pt denies abdominal pain, rectal bleeding or any urinary symptoms.

## 2015-10-26 NOTE — ED Provider Notes (Signed)
[facility] DEPT Provider Note   CSN: [id number] Arrival date & time: 10/26/15  1443     History   Chief Complaint Chief Complaint  Patient presents with  . Diarrhea    HPI Enrigue Willmore is a 70 y.o. male.  HPI   70 year old male presents today with complaints of diarrhea. Patient has a history of normocytic, normal chromic anemia diagnosed in March 2017. His anemia is due to renal insufficiency with a creatinine of 2.9. Patient was also diagnosed with prostate cancer status post radiation. Patient has had transfusions in April and September 2017 due to anemia. Pt currently taking Aranesp q 2 weeks. Pt reports that this morning He had a normal bowel movement that was dark. He reports over the last 2-3 months he's had really dark BMs. He notes following that he had approximately 4 episodes of watery diarrhea that was also very dark. He denies any bright red blood in his bowel movements. Patient notes some minor abdominal cramping in the epigastric region, but notes this is past and has no abdominal discomfort. Patient notes he hasn't had a bowel movement 3-4 hours. Patient notes that he took Pepto-Bismol this morning and Kaopectate yesterday. He reports that he does not use these on a regular basis.   Past Medical History:  Diagnosis Date  . Anemia    hx iron deficiency  . Anxiety   . Arthritis    gout  . Back pain   . BPH with obstruction/lower urinary tract symptoms   . Depression   . ED (erectile dysfunction)   . Epigastric pain   . Heartburn   . History of radiation therapy 11/03/11-12/29/11   prostate  . Hypercholesterolemia   . Hypertension   . Night sweats   . Prostate cancer ([place]) 08/14/11   Adenocarcinoma,gleason:3+3=6,& 3+4=7,PSA=5.66  . PUD (peptic ulcer disease)   . Ulcer ([place])    peptic ulcer hx    Patient Active Problem List   Diagnosis Date Noted  . GI bleed 10/26/2015  . Gastrointestinal hemorrhage   . PUD (peptic ulcer disease)   . Anemia in  chronic kidney disease 04/13/2015  . Nocturnal hypoxemia 11/04/2013  . Noncompliance with treatment 09/02/2013  . Prostate cancer ([place]) 08/14/2011  . Iron deficiency anemia 03/19/2007  . DEPRESSION 03/19/2007  . Essential hypertension 03/19/2007  . NEOPLASM, BENIGN, STOMACH 11/23/2006  . POLYP, COLON 11/23/2006  . INTERNAL HEMORRHOIDS 11/23/2006  . GASTRITIS 11/23/2006    Past Surgical History:  Procedure Laterality Date  . colon polyps bx  11/24/06   colon,transverse and rectosigmoid polyps:tubular adenomas and hyperplastic polyps,no high grade dysplasia or malignancy   . duodenal bx  11/24/06   benign  . gastric bx  11/24/06   chronic active gastritis,with metaplasia and focal changaes of xanthelasma  . INSERTION PROSTATE RADIATION SEED  12-29-11  . PROSTATE BIOPSY  08/14/11   Adenocarcinoma/volume=58.51cc,gleason=3+3=6 & 3+4=7  . TONSILLECTOMY     70 years old       Home Medications    Prior to Admission medications   Medication Sig Start Date End Date Taking? Authorizing Provider  allopurinol (ZYLOPRIM) 300 MG tablet Take 300 mg by mouth daily.  05/10/15  Yes Historical Provider, MD  bismuth subsalicylate (PEPTO BISMOL) 262 MG/15ML suspension Take 30 mLs by mouth every 6 (six) hours as needed for indigestion or diarrhea or loose stools.   Yes Historical Provider, MD  buPROPion (WELLBUTRIN XL) 300 MG 24 hr tablet Take 300 mg by mouth daily.  05/10/15  Yes Historical Provider, MD  diazepam (VALIUM) 5 MG tablet Take 5 mg by mouth at bedtime.   Yes Historical Provider, MD  DM-Doxylamine-Acetaminophen (TYLENOL COUGH/SORE THROAT) 30-12.05-998 MG/30ML LIQD Take 5-10 mLs by mouth every 6 (six) hours as needed (for cough).   Yes Historical Provider, MD  feeding supplement, GLUCERNA SHAKE, (GLUCERNA SHAKE) LIQD Take 237 mLs by mouth once.   Yes Historical Provider, MD  furosemide (LASIX) 80 MG tablet Take 40 mg by mouth daily.    Yes Historical Provider, MD  gabapentin (NEURONTIN) 800  MG tablet Take 400-800 mg by mouth at bedtime.    Yes Historical Provider, MD  influenza vac recom quadrivalent (FLUBLOK) 0.5 ML injection Inject 0.5 mLs into the muscle once.   Yes Historical Provider, MD  latanoprost (XALATAN) 0.005 % ophthalmic solution Place 1 drop into both eyes at bedtime.   Yes Historical Provider, MD  loperamide (IMODIUM A-D) 2 MG tablet Take 2 mg by mouth once.   Yes Historical Provider, MD  Melatonin 10 MG TABS Take 1 tablet by mouth at bedtime.    Yes Historical Provider, MD  Nebivolol HCl (BYSTOLIC) 20 MG TABS Take 20 mg by mouth daily.    Yes Historical Provider, MD  omeprazole (PRILOSEC) 20 MG capsule Take 20 mg by mouth daily.   Yes Historical Provider, MD  OXYGEN Inhale 2 L into the lungs at bedtime as needed (for shortness of breath).   Yes Historical Provider, MD  oxymetazoline (VICKS SINEX MOISTURIZING) 0.05 % nasal spray Place 1 spray into both nostrils 2 (two) times daily as needed for congestion.   Yes Historical Provider, MD  tamsulosin (FLOMAX) 0.4 MG CAPS capsule TAKE 1 CAPSULE BY MOUTH DAILY 06/19/14  Yes Arloa Koh, MD  traZODone (DESYREL) 50 MG tablet Take 50 mg by mouth at bedtime.  05/10/15  Yes Historical Provider, MD  valsartan (DIOVAN) 320 MG tablet Take 320 mg by mouth daily.   Yes Historical Provider, MD  Verapamil HCl CR 300 MG CP24 Take 300 mg by mouth at bedtime.  04/01/15  Yes Historical Provider, MD    Family History Family History  Problem Relation Age of Onset  . Alcohol abuse Father   . Alcohol abuse Brother 50  . Colon cancer Neg Hx     Social History Social History  Substance Use Topics  . Smoking status: Former Smoker    Packs/day: 1.50    Years: 30.00    Types: Cigarettes    Quit date: 04/13/2000  . Smokeless tobacco: Never Used  . Alcohol use 3.6 oz/week    6 Cans of beer per week     Comment: 1-2 beers every other night     Allergies   Review of patient's allergies indicates no known allergies.   Review of  Systems Review of Systems  All other systems reviewed and are negative.   Physical Exam Updated Vital Signs BP 116/69   Pulse 78   Temp 98.1 F (36.7 C)   Resp 18   SpO2 98%   Physical Exam  Constitutional: He is oriented to person, place, and time. He appears well-developed and well-nourished.  HENT:  Head: Normocephalic and atraumatic.  Eyes: Conjunctivae are normal. Pupils are equal, round, and reactive to light. Right eye exhibits no discharge. Left eye exhibits no discharge. No scleral icterus.  Neck: Normal range of motion. No JVD present. No tracheal deviation present.  Pulmonary/Chest: Effort normal. No stridor.  Abdominal: Soft. He exhibits no distension and no mass.  There is no tenderness. There is no rebound and no guarding. No hernia.  Genitourinary:  Genitourinary Comments: Black stool, no bright red blood  Neurological: He is alert and oriented to person, place, and time. Coordination normal.  Psychiatric: He has a normal mood and affect. His behavior is normal. Judgment and thought content normal.  Nursing note and vitals reviewed.   ED Treatments / Results  Labs (all labs ordered are listed, but only abnormal results are displayed) Labs Reviewed  CBC WITH DIFFERENTIAL/PLATELET - Abnormal; Notable for the following:       Result Value   RBC 2.57 (*)    Hemoglobin 7.8 (*)    HCT 24.2 (*)    RDW 18.3 (*)    All other components within normal limits  BASIC METABOLIC PANEL - Abnormal; Notable for the following:    Glucose, Bld 108 (*)    BUN 43 (*)    Creatinine, Ser 1.86 (*)    GFR calc non Af Amer 35 (*)    GFR calc Af Amer 41 (*)    Anion gap 4 (*)    All other components within normal limits  POC OCCULT BLOOD, ED - Abnormal; Notable for the following:    Fecal Occult Bld POSITIVE (*)    All other components within normal limits  TYPE AND SCREEN    EKG  EKG Interpretation None       Radiology No results found.  Procedures Procedures  (including critical care time)  Medications Ordered in ED Medications  pantoprazole (PROTONIX) EC tablet 40 mg (40 mg Oral Given 10/26/15 1859)     Initial Impression / Assessment and Plan / ED Course  I have reviewed the triage vital signs and the nursing notes.  Pertinent labs & imaging results that were available during my care of the patient were reviewed by me and considered in my medical decision making (see chart for details).  Clinical Course     Final Clinical Impressions(s) / ED Diagnoses   Final diagnoses:  Gastrointestinal hemorrhage, unspecified gastrointestinal hemorrhage type    Labs:  Imaging:  Consults:  Therapeutics:  Discharge Meds:   Assessment/Plan:    70 year old male presents today with GI bleed. This is likely upper GI, has had dark stools for several months with several episodes of dark diarrhea today. Patients hemoglobin 7.8 down from 8.4 yesterday. Patient's vital signs remained stable. Due to patient's low hemoglobin and likely GI source Hospital consult at for admission. Gastroenterology consultation, I spoke with Dr. Carlean Purl who would evaluate the patient tomorrow for endoscopy.   New Prescriptions New Prescriptions   No medications on file     Okey Regal, PA-C 10/26/15 2036    Isla Pence, MD 10/27/15 [phone]

## 2015-10-27 ENCOUNTER — Encounter (HOSPITAL_COMMUNITY)
Admission: EM | Disposition: A | Discharge status: Home / Self Care | Payer: Self-pay | Source: Home / Self Care | Attending: Emergency Medicine

## 2015-10-27 ENCOUNTER — Observation Stay (HOSPITAL_COMMUNITY): Payer: Medicare Other

## 2015-10-27 ENCOUNTER — Encounter (HOSPITAL_COMMUNITY): Payer: Self-pay | Admitting: Internal Medicine

## 2015-10-27 DIAGNOSIS — R710 Precipitous drop in hematocrit: Secondary | ICD-10-CM | POA: Diagnosis not present

## 2015-10-27 DIAGNOSIS — K922 Gastrointestinal hemorrhage, unspecified: Secondary | ICD-10-CM | POA: Diagnosis not present

## 2015-10-27 DIAGNOSIS — N183 Chronic kidney disease, stage 3 (moderate): Secondary | ICD-10-CM | POA: Diagnosis not present

## 2015-10-27 DIAGNOSIS — I10 Essential (primary) hypertension: Secondary | ICD-10-CM | POA: Diagnosis not present

## 2015-10-27 DIAGNOSIS — D509 Iron deficiency anemia, unspecified: Secondary | ICD-10-CM

## 2015-10-27 DIAGNOSIS — K921 Melena: Secondary | ICD-10-CM

## 2015-10-27 DIAGNOSIS — K319 Disease of stomach and duodenum, unspecified: Secondary | ICD-10-CM | POA: Diagnosis not present

## 2015-10-27 HISTORY — PX: ESOPHAGOGASTRODUODENOSCOPY: SHX5428

## 2015-10-27 LAB — CBC
HEMATOCRIT: 21.7 % — AB (ref 39.0–52.0)
HEMATOCRIT: 24.3 % — AB (ref 39.0–52.0)
HEMOGLOBIN: 7.3 g/dL — AB (ref 13.0–17.0)
Hemoglobin: 7.2 g/dL — ABNORMAL LOW (ref 13.0–17.0)
MCH: 31.3 pg (ref 26.0–34.0)
MCH: 30.2 pg (ref 26.0–34.0)
MCHC: 33.2 g/dL (ref 30.0–36.0)
MCHC: 30 g/dL (ref 30.0–36.0)
MCV: 94.3 fL (ref 78.0–100.0)
MCV: 100.4 fL — AB (ref 78.0–100.0)
PLATELETS: 192 10*3/uL (ref 150–400)
Platelets: 188 10*3/uL (ref 150–400)
RBC: 2.3 MIL/uL — ABNORMAL LOW (ref 4.22–5.81)
RBC: 2.42 MIL/uL — ABNORMAL LOW (ref 4.22–5.81)
RDW: 18.1 % — AB (ref 11.5–15.5)
RDW: 18.2 % — ABNORMAL HIGH (ref 11.5–15.5)
WBC: 5.8 10*3/uL (ref 4.0–10.5)
WBC: 5.6 10*3/uL (ref 4.0–10.5)

## 2015-10-27 LAB — PROTIME-INR
INR: 1.1
Prothrombin Time: 14.2 seconds (ref 11.4–15.2)

## 2015-10-27 LAB — PREPARE RBC (CROSSMATCH)

## 2015-10-27 IMAGING — CT CT ABD-PELV W/O CM
2 of 4 series · 16 of 46 positions shown, 18 images · non-contrast
Comparison: CT abdomen pelvis [DATE]

CLINICAL DATA: Patient with chronic renal disease. Melanotic
stools.

EXAM:
CT CHEST, ABDOMEN AND PELVIS WITHOUT CONTRAST
TECHNIQUE: Multidetector CT imaging of the chest, abdomen and pelvis was
performed following the standard protocol without IV contrast.

[Series 2: cap w/o w/o st · axial · non-contrast · 0.98mm/px · z∈[-702,-98]mm · 13 of 143 slices shown, 15 images]
[im 11/143  soft-tissue]
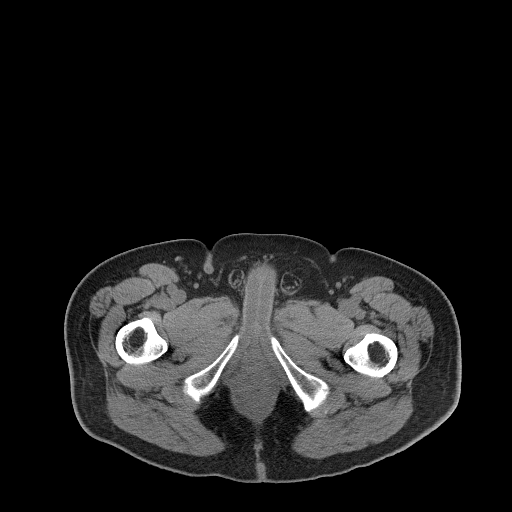
[im 11/143  bone]
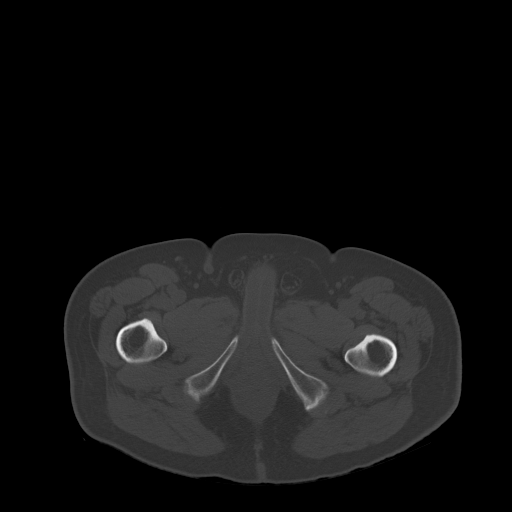
[im 21/143  soft-tissue]
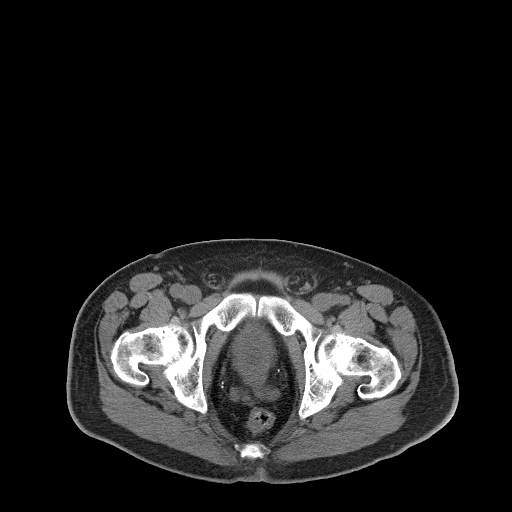
[im 31/143  soft-tissue]
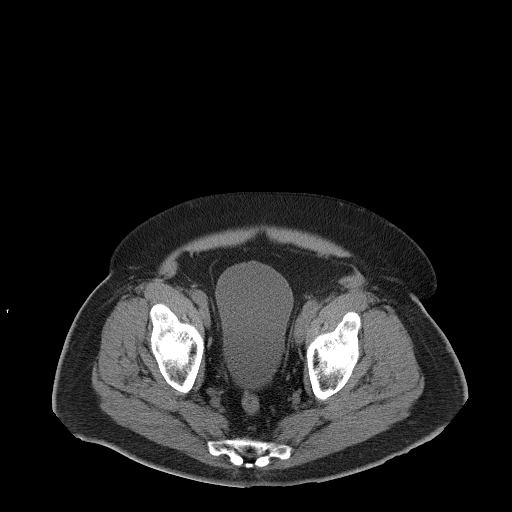
[im 41/143  soft-tissue]
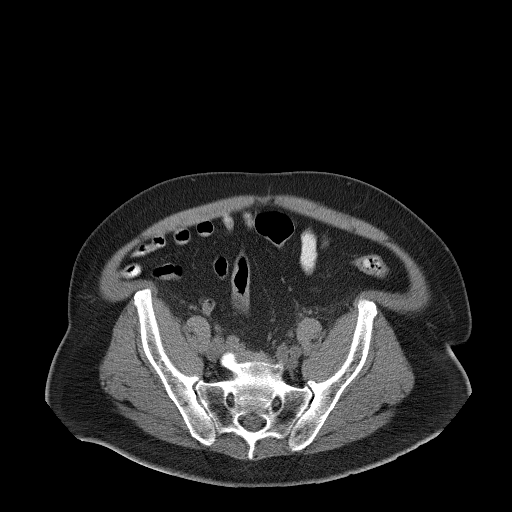
[im 51/143  soft-tissue]
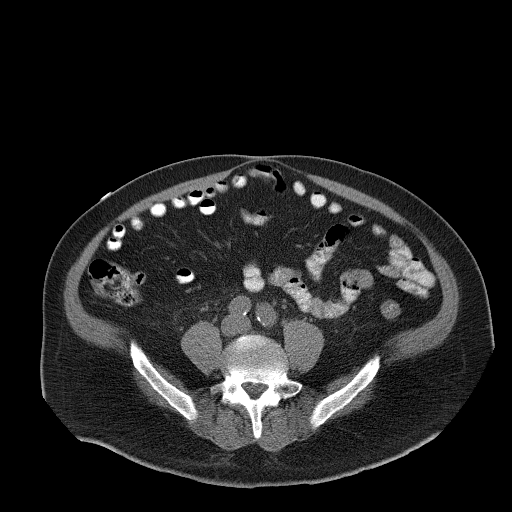
[im 61/143  soft-tissue]
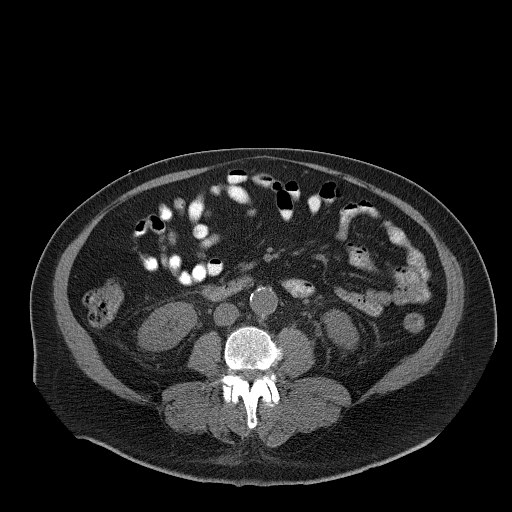
[im 72/143  soft-tissue]
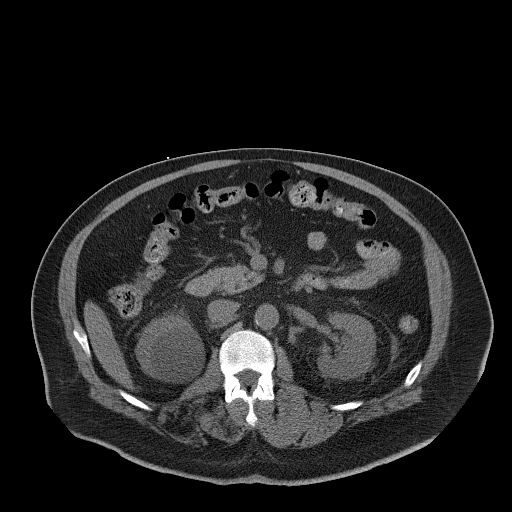
[im 82/143  soft-tissue]
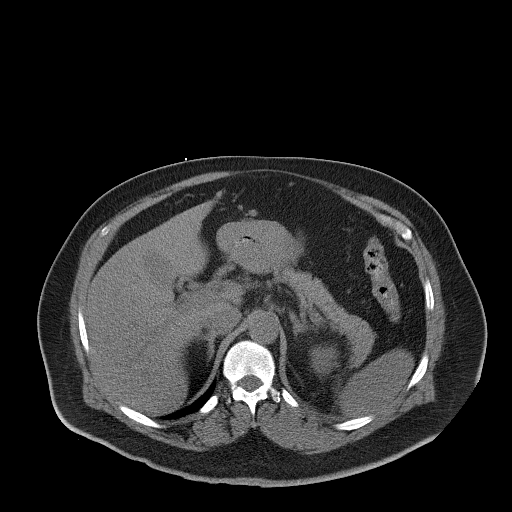
[im 92/143  soft-tissue]
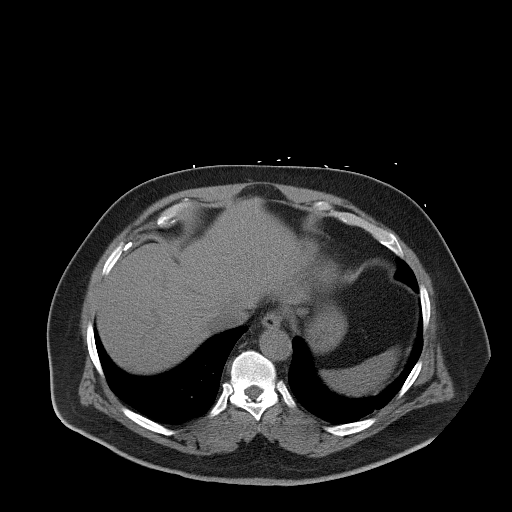
[im 92/143  bone]
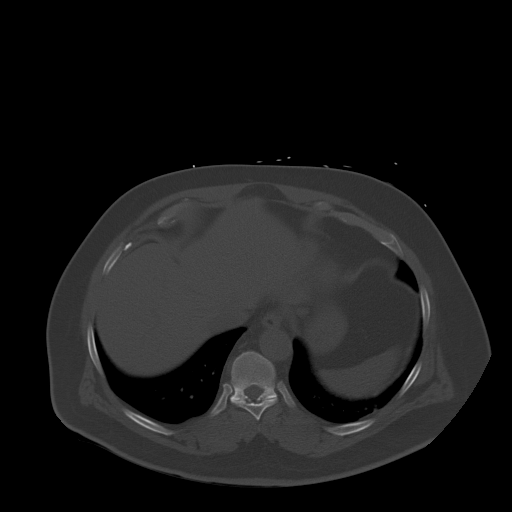
[im 102/143  soft-tissue]
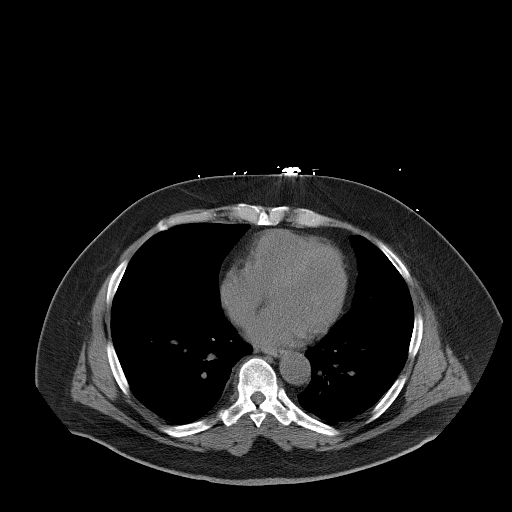
[im 112/143  soft-tissue]
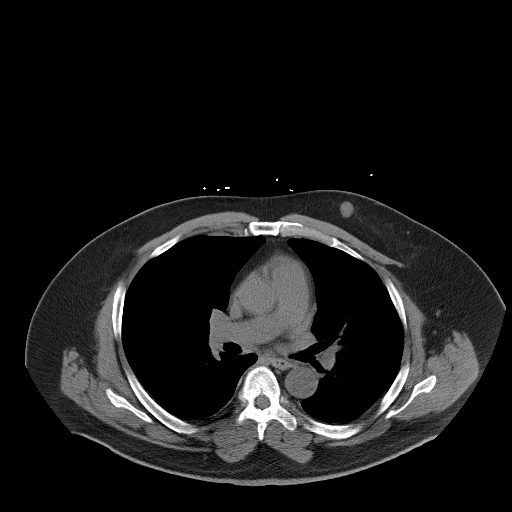
[im 122/143  soft-tissue]
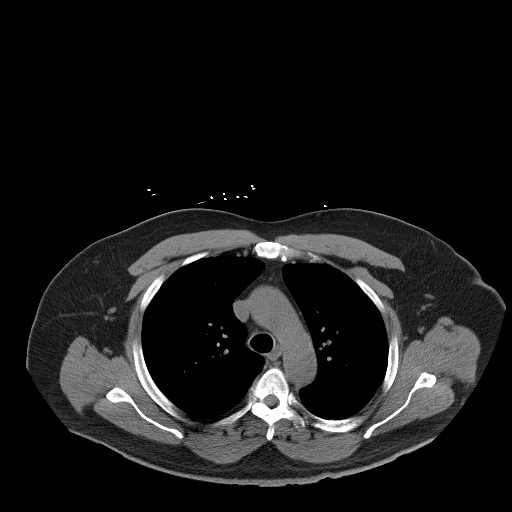
[im 132/143  soft-tissue]
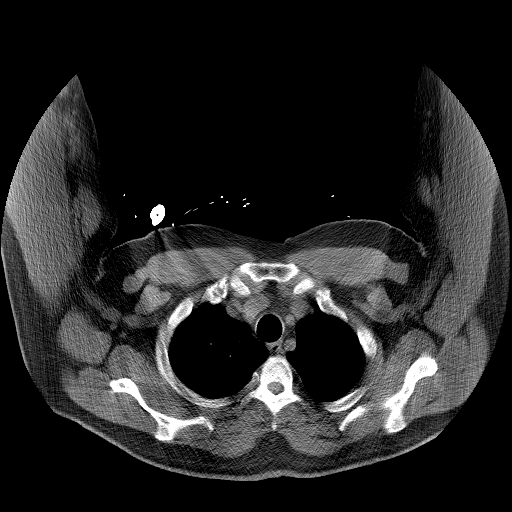

[Series 602: <mpr thick range> · coronal · 1.40mm/px · 3 of 179 slices shown]
[im 60/179  soft-tissue]
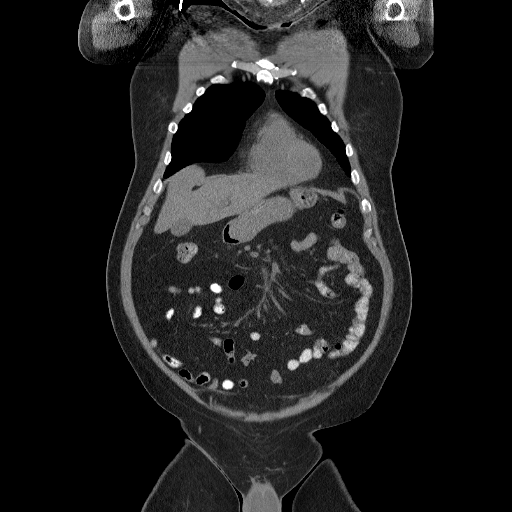
[im 80/179  soft-tissue]
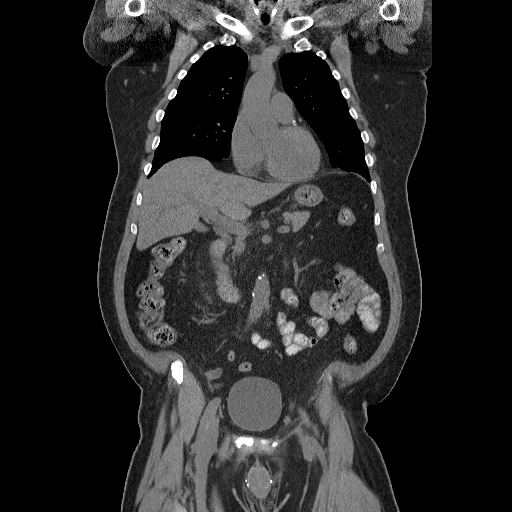
[im 99/179  soft-tissue]
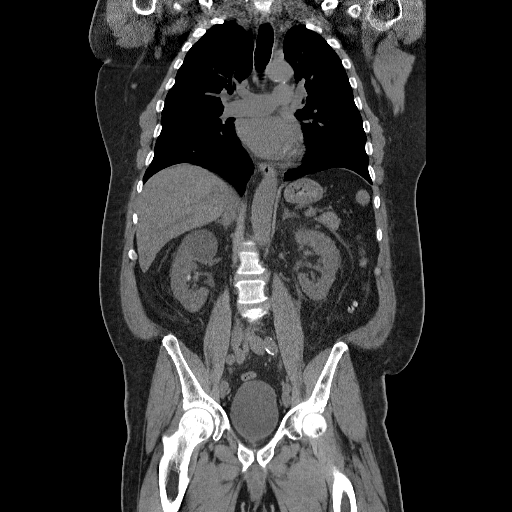

[16 of 46 positions shown; findings below may reference images not displayed]

FINDINGS: CT CHEST FINDINGS

Cardiovascular: Normal heart size. Coronary artery vascular
calcifications. Low attenuation of the blood pool suggestive of
anemia.

Mediastinum/Nodes: No enlarged axillary, mediastinal or hilar
lymphadenopathy. Esophagus is unremarkable.

Lungs/Pleura: Central airways are patent. Minimal atelectasis within
the lower lobes bilaterally. No large area pulmonary consolidation.
No pleural effusion or pneumothorax.

Musculoskeletal: No aggressive or acute appearing osseous lesions.
There is a nonspecific 1.6 cm rounded mass within the subcutaneous
fat overlying the left chest wall (image 32; series 2).

CT ABDOMEN PELVIS FINDINGS

Hepatobiliary: Liver is normal in size and contour. Gallbladder is
unremarkable.

Pancreas: Unremarkable

Spleen: Unremarkable

Adrenals/Urinary Tract: Normal adrenal glands. Grossly unchanged
bilateral perinephric fat stranding. Unchanged 6.2 cm cyst within
the superior pole of the right kidney. Unchanged low-attenuation
lesion within the interpolar region of the right kidney with smooth
rim calcification. No hydronephrosis. No nephroureterolithiasis.
Urinary bladder is unremarkable.

Stomach/Bowel: Descending and sigmoid colonic diverticulosis. No CT
evidence for acute diverticulitis. The appendix is normal. There is
suggestion of circumferential wall thickening of the gastric antrum
without surrounding fat stranding. No evidence for bowel
obstruction.

Vascular/Lymphatic: Infrarenal abdominal aortic ectasia measuring
2.9 cm. Peripheral calcified atherosclerotic plaque of the abdominal
aorta. No retroperitoneal lymphadenopathy.

Reproductive: Prostate unremarkable.

Other: Left-greater-than-right bilateral fat containing inguinal
hernias.

Musculoskeletal: Lumbar spine degenerative changes. No aggressive or
acute appearing osseous lesions.
IMPRESSION: Circumferential wall thickening of the gastric antrum which is
nonspecific and may be secondary to an infectious/inflammatory
process or gastric malignancy/mass. Recommend correlation with upper
endoscopy.

Nonspecific mass within the subcutaneous fat overlying the left
chest wall. Recommend dedicated evaluation physical exam and
ultrasound as clinically indicated.

Aortic atherosclerosis.

These results will be called to the ordering clinician or
representative by the Radiologist Assistant, and communication
documented in the PACS or zVision Dashboard.

## 2015-10-27 SURGERY — EGD (ESOPHAGOGASTRODUODENOSCOPY)
Anesthesia: Moderate Sedation

## 2015-10-27 MED ORDER — FENTANYL CITRATE (PF) 100 MCG/2ML IJ SOLN
INTRAMUSCULAR | Status: AC
  Filled 2015-10-27: qty 2

## 2015-10-27 MED ORDER — SODIUM CHLORIDE 0.9 % IV SOLN
Freq: Once | INTRAVENOUS | Status: AC
  Administered 2015-10-27: 18:00:00 via INTRAVENOUS

## 2015-10-27 MED ORDER — PANTOPRAZOLE SODIUM 40 MG PO TBEC
40.0000 mg | DELAYED_RELEASE_TABLET | Freq: Every day | ORAL | Status: DC
Start: 2015-10-28 — End: 2015-10-29
  Administered 2015-10-28 – 2015-10-29 (×2): 40 mg via ORAL
  Filled 2015-10-27 (×2): qty 1

## 2015-10-27 MED ORDER — MIDAZOLAM HCL 5 MG/ML IJ SOLN
INTRAMUSCULAR | Status: AC
  Filled 2015-10-27: qty 2

## 2015-10-27 MED ORDER — MIDAZOLAM HCL 10 MG/2ML IJ SOLN
INTRAMUSCULAR | Status: DC | PRN
  Administered 2015-10-27: 1 mg via INTRAVENOUS
  Administered 2015-10-27 (×2): 2 mg via INTRAVENOUS

## 2015-10-27 MED ORDER — SODIUM CHLORIDE 0.9 % IV SOLN
INTRAVENOUS | Status: DC
  Administered 2015-10-27: 500 mL via INTRAVENOUS

## 2015-10-27 MED ORDER — FENTANYL CITRATE (PF) 100 MCG/2ML IJ SOLN
INTRAMUSCULAR | Status: DC | PRN
  Administered 2015-10-27 (×2): 25 ug via INTRAVENOUS

## 2015-10-27 MED ORDER — IOPAMIDOL (ISOVUE-300) INJECTION 61%
30.0000 mL | Freq: Once | INTRAVENOUS | Status: AC | PRN
  Administered 2015-10-27: 30 mL via ORAL

## 2015-10-27 MED ORDER — BUTAMBEN-TETRACAINE-BENZOCAINE 2-2-14 % EX AERO
INHALATION_SPRAY | CUTANEOUS | Status: DC | PRN
  Administered 2015-10-27: 2 via TOPICAL

## 2015-10-27 NOTE — Care Management Note (Signed)
Case Management Note  Patient Details  Name: Jacob [name] MRN: [id number] Date of Birth: [birth year]  Subjective/Objective:    Gastrointestinal hemorrhage,                Action/Plan: Discharge Planning: NCM spoke to pt and wife at bedside. Pt states he is independent at home. He reports he does have [employer] benefits. He goes to the [place]. Pt to follow up with [employer] to see if he has secondary coverage.    PCP Prince Solian MD  Expected Discharge Date:                 Expected Discharge Plan:  Home/Self Care  In-House Referral:  NA  Discharge planning Services  CM Consult  Post Acute Care Choice:  NA Choice offered to:  NA  DME Arranged:  N/A DME Agency:  NA  HH Arranged:  NA HH Agency:  NA  Status of Service:  Completed, signed off  If discussed at [place] of Stay Meetings, dates discussed:    Additional Comments:  Erenest Rasher, RN 10/27/2015, 6:26 PM

## 2015-10-27 NOTE — Care Management Obs Status (Signed)
[facility] NOTIFICATION   Patient Details  Name: Lou Loewe MRN: [id number] Date of Birth: July 13, 1945   Medicare Observation Status Notification Given:  Yes    Erenest Rasher, RN 10/27/2015, 6:32 PM

## 2015-10-27 NOTE — Op Note (Addendum)
[facility] Patient Name: Jacob [name] Procedure Date: 10/27/2015 MRN: [id number] Attending MD: [name] , MD Date of Birth: 02-22-1945 CSN: [id number] Age: 70 Admit Type: Inpatient Procedure:                Upper GI endoscopy Indications:              Melena Providers:                [name], MD, Cleda Daub, RN, Corliss Parish, Technician Referring MD:              Medicines:                Midazolam 5 mg IV, Fentanyl 50 micrograms IV,                            Cetacaine spray Complications:            No immediate complications. Estimated Blood Loss:     Estimated blood loss was minimal. Procedure:                Pre-Anesthesia Assessment:                           - Prior to the procedure, a History and Physical                            was performed, and patient medications and                            allergies were reviewed. The patient's tolerance of                            previous anesthesia was also reviewed. The risks                            and benefits of the procedure and the sedation                            options and risks were discussed with the patient.                            All questions were answered, and informed consent                            was obtained. Prior Anticoagulants: The patient has                            taken no previous anticoagulant or antiplatelet                            agents. ASA Grade Assessment: III - A patient with  severe systemic disease. After reviewing the risks                            and benefits, the patient was deemed in                            satisfactory condition to undergo the procedure.                           After obtaining informed consent, the endoscope was                            passed under direct vision. Throughout the                            procedure, the patient's blood  pressure, pulse, and                            oxygen saturations were monitored continuously. The                            Endoscope was introduced through the mouth, and                            advanced to the second part of duodenum. The upper                            GI endoscopy was accomplished without difficulty.                            The patient tolerated the procedure well. Scope In: Scope Out: Findings:      A large, fungating and ulcerated, partially circumferential (involving       one-half of the lumen circumference) mass with no bleeding and stigmata       of recent bleeding was found in the gastric antrum and on the posterior       wall of the gastric antrum. Biopsies were taken with a cold forceps for       histology. Verification of patient identification for the specimen was       done. Estimated blood loss was minimal.      The exam was otherwise without abnormality.      The cardia and gastric fundus were normal on retroflexion. 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 Biopsied.                           - The examination was otherwise normal.                           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 The following parameters were       monitored: oxygen saturation, heart rate, blood pressure, respiratory       rate, EKG, adequacy of pulmonary ventilation, and response to care.       Total physician intraservice time was 13 minutes. Recommendation:           - Await pathology results.                           - Return patient to hospital ward for ongoing care.                           - Resume  previous diet.                           - Continue present medications.                           -                           WILL GO AHEAD AND IMAGE CHEST/ABD/PELVIS W/ CT                            WHILE WAITING FOR PATHOLOGY                           CC: TO DRS. [place] START. FIRAS SHADAD, RAVI [name] Procedure Code(s):        --- Professional ---                           [phone], Esophagogastroduodenoscopy, flexible,                            transoral; with biopsy, single or multiple                           G0500, Moderate sedation services provided by the                            same physician or other qualified health care                            professional performing a gastrointestinal                            endoscopic service that sedation supports,                            requiring the presence of an independent trained  observer to assist in the monitoring of the                            patient's level of consciousness and physiological                            status; initial 15 minutes of intra-service time;                            patient age 59 years or older (additional time may                            be reported with [phone], as appropriate) Diagnosis Code(s):        --- Professional ---                           D49.0, Neoplasm of unspecified behavior of                            digestive system                           K92.1, Melena (includes Hematochezia) CPT copyright 2016 American Medical Association. All rights reserved. The codes documented in this report are preliminary and upon coder review may  be revised to meet current compliance requirements. [name], MD 10/27/2015 1:37:20 PM This report has been signed electronically. Number of Addenda: 0

## 2015-10-27 NOTE — Progress Notes (Signed)
PROGRESS NOTE    Jacob [name]  IOX:[id number]  DOB: 08-20-1945  DOA: 10/26/2015 PCP: Jacob Ringer, MD Outpatient Specialists:   Hospital course:  HPI: Jacob [name] is a 70 y.o. male with medical history significant of CKD, chronic iron def anemia, PUD comes in with one day of melanotic stools.  Pt normally has darker stools (he is on iron infusions along with aranesp with hematology) but this am he had a large very dark smelly BM then subsequent 4 more looser stools that were also dark.  For the last day he has had worsening epigastric abdominal pain,  He has h/o PUD in the past with last scoping done in 2014.  He has not vomited.  He reports on/off epigastric pain for months, relieved with tums sometimes.  He takes otc prilosec but has not taken this in about a week.  No fevers.  His hgb has been around 8 despite infusions/aranesp for several months.  He sometimes requires blood transfusions.  Pt denies any weakness, dizziness, sob.  Pt referred for admission for possible GIB.  Jacob [name] called GI on call also for consultation for possible EGD.  Assessment & Plan:   Principal Problem:   Gastrointestinal hemorrhage Active Problems:   Iron deficiency anemia   Essential hypertension   Prostate cancer ([place])   Anemia in chronic kidney disease   GI bleed   GI hemorrhage - Appreciate GI consultation - Pt to have endoscopy later today.    Anemia- multifactorial from iron deficiency, chronic blood loss, CKD - Hg held stable since admission at 7.2.  Following.  GI eval later today.    Essential Hypertension - stable on home meds, following.  History of prostate cancer - in remission  CKD - stage 3 - stable at baseline  DVT prophylaxis: SCDs Code Status: full Family Communication: son updated  Disposition Plan: TBD   Consultants:  GI   Procedures:  Pending   Subjective: Pt says he gets weak with ambulation, otherwise no further black BMs.    Objective: Vitals:   10/26/15 2108 10/26/15 2120 10/27/15 0439 10/27/15 0629  BP: 119/76 (!) 141/68 (!) 97/51 111/63  Pulse: 76 79 66 64  Resp: 18 16 16    Temp:  98.6 F (37 C) 98.6 F (37 C)   TempSrc:  Oral Oral   SpO2: 98% 100% 95%   Weight:  119.9 kg (264 lb 5.3 oz)    Height:  6\' 2"  (1.88 m)      Intake/Output Summary (Last 24 hours) at 10/27/15 0756 Last data filed at 10/27/15 0748  Gross per 24 hour  Intake              120 ml  Output             1025 ml  Net             -905 ml   Filed Weights   10/26/15 2120  Weight: 119.9 kg (264 lb 5.3 oz)    Exam:  General exam: awake, alert, no distress, cooperative Respiratory system: Clear. No increased work of breathing. Cardiovascular system: S1 & S2 heard.  Gastrointestinal system: Abdomen is nondistended, soft and nontender. Normal bowel sounds heard. Central nervous system: Alert and oriented. No focal neurological deficits. Extremities: no CCE.  Data Reviewed: Basic Metabolic Panel:  Recent Labs Lab 10/26/15 1551  NA 136  K 4.8  CL 108  CO2 24  GLUCOSE 108*  BUN 43*  CREATININE 1.86*  CALCIUM 9.0  Liver Function Tests: No results for input(s): AST, ALT, ALKPHOS, BILITOT, PROT, ALBUMIN in the last 168 hours. No results for input(s): LIPASE, AMYLASE in the last 168 hours. No results for input(s): AMMONIA in the last 168 hours. CBC:  Recent Labs Lab 10/25/15 0925 10/26/15 1551 10/26/15 2319 10/27/15 0541  WBC 7.4 6.5  --  5.8  NEUTROABS 5.5 5.0  --   --   HGB 8.4* 7.8* 7.2* 7.2*  HCT 27.5* 24.2* 23.2* 21.7*  MCV 96.8 94.2  --  94.3  PLT 216 228  --  192   Cardiac Enzymes: No results for input(s): CKTOTAL, CKMB, CKMBINDEX, TROPONINI in the last 168 hours. CBG (last 3)  No results for input(s): GLUCAP in the last 72 hours. No results found for this or any previous visit (from the past 240 hour(s)).   Studies: No results found.   Scheduled Meds: . allopurinol  300 mg Oral Daily  .  buPROPion  300 mg Oral Daily  . diazepam  5 mg Oral QHS  . furosemide  40 mg Oral Daily  . gabapentin  400-800 mg Oral QHS  . latanoprost  1 drop Both Eyes QHS  . nebivolol  20 mg Oral Daily  . pantoprazole  40 mg Oral BID  . sodium chloride flush  3 mL Intravenous Q12H  . sodium chloride flush  3 mL Intravenous Q12H  . tamsulosin  0.4 mg Oral Daily  . traZODone  50 mg Oral QHS  . verapamil  300 mg Oral QHS   Continuous Infusions:   Principal Problem:   Gastrointestinal hemorrhage Active Problems:   Iron deficiency anemia   Essential hypertension   Prostate cancer ([facility])   Anemia in chronic kidney disease   GI bleed   Time spent:   Jacob Brakeman, MD, FAAFP [facility] Pager [phone]  If 7PM-7AM, please contact night-coverage [email or web address] Password [email or web address] 10/27/2015, 7:56 AM    LOS: 0 days

## 2015-10-27 NOTE — Consult Note (Signed)
Consultation  Referring Provider:     [facility] Dr. Shanon Brow Primary Care Physician:  Tivis Ringer, MD Primary Gastroenterologist:      Fuller Plan   Reason for Consultation:     GI bleeding     Impression / Plan:   Melena and 1 g decrease in Hgb  Diarrhea - dark - ? If black stools before or after Pepto bismol - he is heme + Epigastric pain Anemia of chronic disease - kidney failure - also iron tx chronic low iron sat  Will evaluate w/ EGD - ? Gastroenteritis/diarrhea syndrome and black stools from Pepto bismol or a low-grade acute UGI bleed? The risks and benefits as well as alternatives of endoscopic procedure(s) have been discussed and reviewed. All questions answered. The patient agrees to proceed.         HPI:   Jacob [name] is a 70 y.o. male in [facility] until few days pre-admit when he started having abdominal pain in epigastrium off and on helped somewhat by Tums. Diarrhea turned dark black 1 d PTA - somewhere in theer he began [place]. Heme + in ED. CBC Latest Ref Rng & Units 10/27/2015 10/26/2015 10/26/2015  WBC 4.0 - 10.5 K/uL 5.8 - 6.5  Hemoglobin 13.0 - 17.0 g/dL 7.2(L) 7.2(L) 7.8(L)  Hematocrit 39.0 - 52.0 % 21.7(L) 23.2(L) 24.2(L)  Platelets 150 - 400 K/uL 192 - 228   No vomiting, ? Mild nausea.  Was taking his omeprazole and no ASA/NSAIDS. Feels ok this am  EGD 2014 intestinal metaplasia in antrum as before Colonoscopy 2014 - 2 hyperplastic polyps max 7 mm  Past Medical History:  Diagnosis Date  . Anemia    hx iron deficiency  . Anxiety   . Arthritis    gout  . Back pain   . BPH with obstruction/lower urinary tract symptoms   . Depression   . ED (erectile dysfunction)   . Epigastric pain   . Heartburn   . History of radiation therapy 11/03/11-12/29/11   prostate  . Hypercholesterolemia   . Hypertension   . Night sweats   . Prostate cancer ([place]) 08/14/11   Adenocarcinoma,gleason:3+3=6,& 3+4=7,PSA=5.66  . PUD (peptic ulcer disease)   . Ulcer  ([place])    peptic ulcer hx    Past Surgical History:  Procedure Laterality Date  . colon polyps bx  11/24/06   colon,transverse and rectosigmoid polyps:tubular adenomas and hyperplastic polyps,no high grade dysplasia or malignancy   . duodenal bx  11/24/06   benign  . gastric bx  11/24/06   chronic active gastritis,with metaplasia and focal changaes of xanthelasma  . INSERTION PROSTATE RADIATION SEED  12-29-11  . PROSTATE BIOPSY  08/14/11   Adenocarcinoma/volume=58.51cc,gleason=3+3=6 & 3+4=7  . TONSILLECTOMY     70 years old    Family History  Problem Relation Age of Onset  . Alcohol abuse Father   . Alcohol abuse Brother 50  . Colon cancer Neg Hx     Social History  Substance Use Topics  . Smoking status: Former Smoker    Packs/day: 1.50    Years: 30.00    Types: Cigarettes    Quit date: 04/13/2000  . Smokeless tobacco: Never Used  . Alcohol use 3.6 oz/week    6 Cans of beer per week     Comment: 1-2 beers every other night    Prior to Admission medications   Medication Sig Start Date End Date Taking? Authorizing Provider  allopurinol (ZYLOPRIM) 300 MG tablet Take 300 mg  by mouth daily.  05/10/15  Yes Historical Provider, MD  bismuth subsalicylate (PEPTO BISMOL) 262 MG/15ML suspension Take 30 mLs by mouth every 6 (six) hours as needed for indigestion or diarrhea or loose stools.   Yes Historical Provider, MD  buPROPion (WELLBUTRIN XL) 300 MG 24 hr tablet Take 300 mg by mouth daily.  05/10/15  Yes Historical Provider, MD  diazepam (VALIUM) 5 MG tablet Take 5 mg by mouth at bedtime.   Yes Historical Provider, MD  DM-Doxylamine-Acetaminophen (TYLENOL COUGH/SORE THROAT) 30-12.05-998 MG/30ML LIQD Take 5-10 mLs by mouth every 6 (six) hours as needed (for cough).   Yes Historical Provider, MD  feeding supplement, GLUCERNA SHAKE, (GLUCERNA SHAKE) LIQD Take 237 mLs by mouth once.   Yes Historical Provider, MD  furosemide (LASIX) 80 MG tablet Take 40 mg by mouth daily.    Yes Historical  Provider, MD  gabapentin (NEURONTIN) 800 MG tablet Take 400-800 mg by mouth at bedtime.    Yes Historical Provider, MD  influenza vac recom quadrivalent (FLUBLOK) 0.5 ML injection Inject 0.5 mLs into the muscle once.   Yes Historical Provider, MD  latanoprost (XALATAN) 0.005 % ophthalmic solution Place 1 drop into both eyes at bedtime.   Yes Historical Provider, MD  loperamide (IMODIUM A-D) 2 MG tablet Take 2 mg by mouth once.   Yes Historical Provider, MD  Melatonin 10 MG TABS Take 1 tablet by mouth at bedtime.    Yes Historical Provider, MD  Nebivolol HCl (BYSTOLIC) 20 MG TABS Take 20 mg by mouth daily.    Yes Historical Provider, MD  omeprazole (PRILOSEC) 20 MG capsule Take 20 mg by mouth daily.   Yes Historical Provider, MD  OXYGEN Inhale 2 L into the lungs at bedtime as needed (for shortness of breath).   Yes Historical Provider, MD  oxymetazoline (VICKS SINEX MOISTURIZING) 0.05 % nasal spray Place 1 spray into both nostrils 2 (two) times daily as needed for congestion.   Yes Historical Provider, MD  tamsulosin (FLOMAX) 0.4 MG CAPS capsule TAKE 1 CAPSULE BY MOUTH DAILY 06/19/14  Yes Arloa Koh, MD  traZODone (DESYREL) 50 MG tablet Take 50 mg by mouth at bedtime.  05/10/15  Yes Historical Provider, MD  valsartan (DIOVAN) 320 MG tablet Take 320 mg by mouth daily.   Yes Historical Provider, MD  Verapamil HCl CR 300 MG CP24 Take 300 mg by mouth at bedtime.  04/01/15  Yes Historical Provider, MD    Current Facility-Administered Medications  Medication Dose Route Frequency Provider Last Rate Last Dose  . 0.9 %  sodium chloride infusion  250 mL Intravenous PRN Phillips Grout, MD      . 0.9 %  sodium chloride infusion   Intravenous Continuous [name], MD      . dextromethorphan (DELSYM) 30 MG/5ML liquid 30 mg  30 mg Oral Q6H PRN Phillips Grout, MD       And  . acetaminophen (TYLENOL) tablet 1,000 mg  1,000 mg Oral Q6H PRN Phillips Grout, MD      . allopurinol (ZYLOPRIM) tablet 300 mg  300  mg Oral Daily Phillips Grout, MD   300 mg at 10/27/15 0902  . bismuth subsalicylate (PEPTO BISMOL) 262 MG/15ML suspension 30 mL  30 mL Oral Q6H PRN Phillips Grout, MD      . buPROPion (WELLBUTRIN XL) 24 hr tablet 300 mg  300 mg Oral Daily Phillips Grout, MD   300 mg at 10/27/15 0900  . diazepam (  VALIUM) tablet 5 mg  5 mg Oral QHS Phillips Grout, MD   5 mg at 10/26/15 2253  . furosemide (LASIX) tablet 40 mg  40 mg Oral Daily Phillips Grout, MD      . gabapentin (NEURONTIN) capsule 400-800 mg  400-800 mg Oral QHS Phillips Grout, MD   400 mg at 10/26/15 2252  . latanoprost (XALATAN) 0.005 % ophthalmic solution 1 drop  1 drop Both Eyes QHS Phillips Grout, MD   1 drop at 10/26/15 2307  . nebivolol (BYSTOLIC) tablet 20 mg  20 mg Oral Daily Phillips Grout, MD   20 mg at 10/27/15 0900  . pantoprazole (PROTONIX) EC tablet 40 mg  40 mg Oral BID Phillips Grout, MD   40 mg at 10/27/15 0902  . sodium chloride flush (NS) 0.9 % injection 3 mL  3 mL Intravenous Q12H Phillips Grout, MD      . sodium chloride flush (NS) 0.9 % injection 3 mL  3 mL Intravenous Q12H Phillips Grout, MD   3 mL at 10/27/15 0902  . sodium chloride flush (NS) 0.9 % injection 3 mL  3 mL Intravenous PRN Phillips Grout, MD      . tamsulosin (FLOMAX) capsule 0.4 mg  0.4 mg Oral Daily Phillips Grout, MD   0.4 mg at 10/27/15 0902  . traZODone (DESYREL) tablet 50 mg  50 mg Oral QHS Phillips Grout, MD   50 mg at 10/26/15 2252  . verapamil (CALAN-SR) CR tablet 300 mg  300 mg Oral QHS Phillips Grout, MD   300 mg at 10/26/15 2253    Allergies as of 10/26/2015  . (No Known Allergies)     Review of Systems:    This is positive for those things mentioned in the HPI, also positive for weakness All other review of systems are negative.       Physical Exam:  Vital signs in last 24 hours: Temp:  [98.1 F (36.7 C)-98.6 F (37 C)] 98.6 F (37 C) (10/14 0439) Pulse Rate:  [64-80] 68 (10/14 0849) Resp:  [16-18] 16 (10/14 0439) BP:  (97-141)/(51-83) 119/59 (10/14 0849) SpO2:  [95 %-100 %] 95 % (10/14 0439) Weight:  [264 lb 5.3 oz (119.9 kg)] 264 lb 5.3 oz (119.9 kg) (10/13 2120) Last BM Date: 10/26/15  General:  Well-developed, well-nourished and in no acute distress Eyes:  anicteric. Neck:   supple w/o thyromegaly or mass.  Lungs: Clear to auscultation bilaterally. Heart:  S1S2, no rubs, murmurs, gallops. Abdomen:  soft, non-tender, no hepatosplenomegaly,  or mass and BS+. Small reducible umbilical hernia and + diastasis recti   Extremities:   no edema, clubbing Skin   no rash. Neuro:  A&O x 3.  Psych:  appropriate mood and  Affect.   Data Reviewed:   LAB RESULTS:  Recent Labs  10/25/15 0925 10/26/15 1551 10/26/15 2319 10/27/15 0541  WBC 7.4 6.5  --  5.8  HGB 8.4* 7.8* 7.2* 7.2*  HCT 27.5* 24.2* 23.2* 21.7*  PLT 216 228  --  192   BMET  Recent Labs  10/26/15 1551  NA 136  K 4.8  CL 108  CO2 24  GLUCOSE 108*  BUN 43*  CREATININE 1.86*  CALCIUM 9.0    PREVIOUS ENDOSCOPIES:            Multiple see HPI   Thanks   LOS: 0 days   @Shakeila Pfarr  Simonne Maffucci, MD, [facility] @  10/27/2015,  9:59 AM

## 2015-10-28 DIAGNOSIS — N183 Chronic kidney disease, stage 3 (moderate): Secondary | ICD-10-CM | POA: Diagnosis not present

## 2015-10-28 DIAGNOSIS — I10 Essential (primary) hypertension: Secondary | ICD-10-CM | POA: Diagnosis not present

## 2015-10-28 DIAGNOSIS — K319 Disease of stomach and duodenum, unspecified: Secondary | ICD-10-CM | POA: Diagnosis not present

## 2015-10-28 DIAGNOSIS — D509 Iron deficiency anemia, unspecified: Secondary | ICD-10-CM | POA: Diagnosis not present

## 2015-10-28 DIAGNOSIS — K5900 Constipation, unspecified: Secondary | ICD-10-CM

## 2015-10-28 DIAGNOSIS — K922 Gastrointestinal hemorrhage, unspecified: Secondary | ICD-10-CM | POA: Diagnosis not present

## 2015-10-28 DIAGNOSIS — K3189 Other diseases of stomach and duodenum: Secondary | ICD-10-CM

## 2015-10-28 LAB — CBC
HEMATOCRIT: 26.1 % — AB (ref 39.0–52.0)
Hemoglobin: 8.4 g/dL — ABNORMAL LOW (ref 13.0–17.0)
MCH: 29.5 pg (ref 26.0–34.0)
MCHC: 32.2 g/dL (ref 30.0–36.0)
MCV: 91.6 fL (ref 78.0–100.0)
PLATELETS: 194 10*3/uL (ref 150–400)
RBC: 2.85 MIL/uL — ABNORMAL LOW (ref 4.22–5.81)
RDW: 19 % — AB (ref 11.5–15.5)
WBC: 6 10*3/uL (ref 4.0–10.5)

## 2015-10-28 LAB — HEMOGLOBIN AND HEMATOCRIT, BLOOD
HCT: 29.5 % — ABNORMAL LOW (ref 39.0–52.0)
Hemoglobin: 9.6 g/dL — ABNORMAL LOW (ref 13.0–17.0)

## 2015-10-28 LAB — CEA: CEA: 100.9 ng/mL — AB (ref 0.0–4.7)

## 2015-10-28 LAB — PREPARE RBC (CROSSMATCH)

## 2015-10-28 MED ORDER — ENSURE ENLIVE PO LIQD
237.0000 mL | Freq: Two times a day (BID) | ORAL | Status: DC
  Administered 2015-10-28 – 2015-10-29 (×2): 237 mL via ORAL

## 2015-10-28 MED ORDER — POLYETHYLENE GLYCOL 3350 17 G PO PACK
17.0000 g | PACK | Freq: Every day | ORAL | Status: DC
  Administered 2015-10-28 – 2015-10-29 (×2): 17 g via ORAL
  Filled 2015-10-28 (×2): qty 1

## 2015-10-28 MED ORDER — SODIUM CHLORIDE 0.9 % IV SOLN
Freq: Once | INTRAVENOUS | Status: AC
  Administered 2015-10-28: 09:00:00 via INTRAVENOUS

## 2015-10-28 NOTE — Progress Notes (Signed)
PROGRESS NOTE   Jacob [name]  GXQ:[id number]  DOB: [birth year]  DOA: 10/26/2015 PCP: Tivis Ringer, MD   Hospital course:  HPI: Jacob [name] is a 70 y.o. male with medical history significant of CKD, chronic iron def anemia, PUD comes in with one day of melanotic stools.  Pt normally has darker stools (he is on iron infusions along with aranesp with hematology) but this am he had a large very dark smelly BM then subsequent 4 more looser stools that were also dark.  For the last day he has had worsening epigastric abdominal pain,  He has h/o PUD in the past with last scoping done in 2014.  He has not vomited.  He reports on/off epigastric pain for months, relieved with tums sometimes.  He takes otc prilosec but has not taken this in about a week.  No fevers.  His hgb has been around 8 despite infusions/aranesp for several months.  He sometimes requires blood transfusions.  Pt denies any weakness, dizziness, sob.  Pt referred for admission for possible GIB.  [name] called GI on call also for consultation for possible EGD.  Assessment & Plan:   Principal Problem:   Gastrointestinal hemorrhage Active Problems:   Iron deficiency anemia   Essential hypertension   Prostate cancer ([place])   Anemia in chronic kidney disease   GI bleed   GI hemorrhage - Appreciate GI consultation - Pt had upper endoscopy and findings concerning for gastric malignancy (biopsies pending). Surveillance CT reviewed see results below.      Anemia- multifactorial from iron deficiency, chronic blood loss, CKD - Pt is s/p 2 units PRBC, Hg 8.4 this morning, will order 1 additional unit of PRBC today. Appreciate GI eval.      Essential Hypertension - stable on home meds, following.  History of prostate cancer - in remission.  Chronic constipation- adding miralax daily.  CKD - stage 3 - stable at baseline.   DVT prophylaxis: SCDs Code Status: full Disposition Plan: TBD  Consultants:  GI    Procedures:  EGD 10/14   Subjective: Pt says he has not had a BM since being admitted.   Objective: Vitals:   10/27/15 2102 10/27/15 2135 10/28/15 0015 10/28/15 0610  BP: 133/76 131/78 115/69 113/62  Pulse: 72 70 70 62  Resp: 16 16 16 18   Temp: 98.4 F (36.9 C) 98.2 F (36.8 C) 98.4 F (36.9 C) 98.2 F (36.8 C)  TempSrc: Oral Oral Oral Oral  SpO2: 100% 98% 97% 94%  Weight:      Height:        Intake/Output Summary (Last 24 hours) at 10/28/15 0819 Last data filed at 10/28/15 0600  Gross per 24 hour  Intake           841.17 ml  Output                0 ml  Net           841.17 ml   Filed Weights   10/26/15 2120  Weight: 119.9 kg (264 lb 5.3 oz)    Exam:  General exam: awake, alert, no distress, cooperative Respiratory system: Clear. No increased work of breathing. Cardiovascular system: S1 & S2 heard.  Gastrointestinal system: Abdomen is nondistended, soft and nontender. Normal bowel sounds heard. Central nervous system: Alert and oriented. No focal neurological deficits. Extremities: no CCE.  Data Reviewed: Basic Metabolic Panel:  Recent Labs Lab 10/26/15 1551  NA 136  K 4.8  CL 108  CO2 24  GLUCOSE 108*  BUN 43*  CREATININE 1.86*  CALCIUM 9.0   Liver Function Tests: No results for input(s): AST, ALT, ALKPHOS, BILITOT, PROT, ALBUMIN in the last 168 hours. No results for input(s): LIPASE, AMYLASE in the last 168 hours. No results for input(s): AMMONIA in the last 168 hours. CBC:  Recent Labs Lab 10/25/15 0925 10/26/15 1551 10/26/15 2319 10/27/15 0541 10/27/15 1510 10/28/15 0448  WBC 7.4 6.5  --  5.8 5.6 6.0  NEUTROABS 5.5 5.0  --   --   --   --   HGB 8.4* 7.8* 7.2* 7.2* 7.3* 8.4*  HCT 27.5* 24.2* 23.2* 21.7* 24.3* 26.1*  MCV 96.8 94.2  --  94.3 100.4* 91.6  PLT 216 228  --  192 188 194   Cardiac Enzymes: No results for input(s): CKTOTAL, CKMB, CKMBINDEX, TROPONINI in the last 168 hours. CBG (last 3)  No results for input(s): GLUCAP  in the last 72 hours. No results found for this or any previous visit (from the past 240 hour(s)).   Studies: Ct Abdomen Pelvis Wo Contrast  Result Date: 10/27/2015 CLINICAL DATA:  Patient with chronic renal disease. Melanotic stools. EXAM: CT CHEST, ABDOMEN AND PELVIS WITHOUT CONTRAST TECHNIQUE: Multidetector CT imaging of the chest, abdomen and pelvis was performed following the standard protocol without IV contrast. COMPARISON:  CT abdomen pelvis 06/07/2014 FINDINGS: CT CHEST FINDINGS Cardiovascular: Normal heart size. Coronary artery vascular calcifications. Low attenuation of the blood pool suggestive of anemia. Mediastinum/Nodes: No enlarged axillary, mediastinal or hilar lymphadenopathy. Esophagus is unremarkable. Lungs/Pleura: Central airways are patent. Minimal atelectasis within the lower lobes bilaterally. No large area pulmonary consolidation. No pleural effusion or pneumothorax. Musculoskeletal: No aggressive or acute appearing osseous lesions. There is a nonspecific 1.6 cm rounded mass within the subcutaneous fat overlying the left chest wall (image 32; series 2). CT ABDOMEN PELVIS FINDINGS Hepatobiliary: Liver is normal in size and contour. Gallbladder is unremarkable. Pancreas: Unremarkable Spleen: Unremarkable Adrenals/Urinary Tract: Normal adrenal glands. Grossly unchanged bilateral perinephric fat stranding. Unchanged 6.2 cm cyst within the superior pole of the right kidney. Unchanged low-attenuation lesion within the interpolar region of the right kidney with smooth rim calcification. No hydronephrosis. No nephroureterolithiasis. Urinary bladder is unremarkable. Stomach/Bowel: Descending and sigmoid colonic diverticulosis. No CT evidence for acute diverticulitis. The appendix is normal. There is suggestion of circumferential wall thickening of the gastric antrum without surrounding fat stranding. No evidence for bowel obstruction. Vascular/Lymphatic: Infrarenal abdominal aortic ectasia  measuring 2.9 cm. Peripheral calcified atherosclerotic plaque of the abdominal aorta. No retroperitoneal lymphadenopathy. Reproductive: Prostate unremarkable. Other: Left-greater-than-right bilateral fat containing inguinal hernias. Musculoskeletal: Lumbar spine degenerative changes. No aggressive or acute appearing osseous lesions. IMPRESSION: Circumferential wall thickening of the gastric antrum which is nonspecific and may be secondary to an infectious/inflammatory process or gastric malignancy/mass. Recommend correlation with upper endoscopy. Nonspecific mass within the subcutaneous fat overlying the left chest wall. Recommend dedicated evaluation physical exam and ultrasound as clinically indicated. Aortic atherosclerosis. These results will be called to the ordering clinician or representative by the Radiologist Assistant, and communication documented in the PACS or zVision Dashboard. Electronically Signed   By: Lovey Newcomer M.D.   On: 10/27/2015 18:55   Ct Chest Wo Contrast  Result Date: 10/27/2015 CLINICAL DATA:  Patient with chronic renal disease. Melanotic stools. EXAM: CT CHEST, ABDOMEN AND PELVIS WITHOUT CONTRAST TECHNIQUE: Multidetector CT imaging of the chest, abdomen and pelvis was performed following the standard protocol without IV contrast. COMPARISON:  CT abdomen  pelvis 06/07/2014 FINDINGS: CT CHEST FINDINGS Cardiovascular: Normal heart size. Coronary artery vascular calcifications. Low attenuation of the blood pool suggestive of anemia. Mediastinum/Nodes: No enlarged axillary, mediastinal or hilar lymphadenopathy. Esophagus is unremarkable. Lungs/Pleura: Central airways are patent. Minimal atelectasis within the lower lobes bilaterally. No large area pulmonary consolidation. No pleural effusion or pneumothorax. Musculoskeletal: No aggressive or acute appearing osseous lesions. There is a nonspecific 1.6 cm rounded mass within the subcutaneous fat overlying the left chest wall (image 32;  series 2). CT ABDOMEN PELVIS FINDINGS Hepatobiliary: Liver is normal in size and contour. Gallbladder is unremarkable. Pancreas: Unremarkable Spleen: Unremarkable Adrenals/Urinary Tract: Normal adrenal glands. Grossly unchanged bilateral perinephric fat stranding. Unchanged 6.2 cm cyst within the superior pole of the right kidney. Unchanged low-attenuation lesion within the interpolar region of the right kidney with smooth rim calcification. No hydronephrosis. No nephroureterolithiasis. Urinary bladder is unremarkable. Stomach/Bowel: Descending and sigmoid colonic diverticulosis. No CT evidence for acute diverticulitis. The appendix is normal. There is suggestion of circumferential wall thickening of the gastric antrum without surrounding fat stranding. No evidence for bowel obstruction. Vascular/Lymphatic: Infrarenal abdominal aortic ectasia measuring 2.9 cm. Peripheral calcified atherosclerotic plaque of the abdominal aorta. No retroperitoneal lymphadenopathy. Reproductive: Prostate unremarkable. Other: Left-greater-than-right bilateral fat containing inguinal hernias. Musculoskeletal: Lumbar spine degenerative changes. No aggressive or acute appearing osseous lesions. IMPRESSION: Circumferential wall thickening of the gastric antrum which is nonspecific and may be secondary to an infectious/inflammatory process or gastric malignancy/mass. Recommend correlation with upper endoscopy. Nonspecific mass within the subcutaneous fat overlying the left chest wall. Recommend dedicated evaluation physical exam and ultrasound as clinically indicated. Aortic atherosclerosis. These results will be called to the ordering clinician or representative by the Radiologist Assistant, and communication documented in the PACS or zVision Dashboard. Electronically Signed   By: Lovey Newcomer M.D.   On: 10/27/2015 18:55   Scheduled Meds: . sodium chloride   Intravenous Once  . allopurinol  300 mg Oral Daily  . buPROPion  300 mg Oral  Daily  . diazepam  5 mg Oral QHS  . furosemide  40 mg Oral Daily  . gabapentin  400-800 mg Oral QHS  . latanoprost  1 drop Both Eyes QHS  . nebivolol  20 mg Oral Daily  . pantoprazole  40 mg Oral QAC breakfast  . polyethylene glycol  17 g Oral Daily  . sodium chloride flush  3 mL Intravenous Q12H  . sodium chloride flush  3 mL Intravenous Q12H  . tamsulosin  0.4 mg Oral Daily  . traZODone  50 mg Oral QHS  . verapamil  300 mg Oral QHS   Continuous Infusions:   Principal Problem:   Gastrointestinal hemorrhage Active Problems:   Iron deficiency anemia   Essential hypertension   Prostate cancer ([facility])   Anemia in chronic kidney disease   GI bleed  Time spent:   Irwin Brakeman, MD, FAAFP [facility] Pager [phone]  If 7PM-7AM, please contact night-coverage [email or web address] Password [email or web address] 10/28/2015, 8:19 AM    LOS: 0 days

## 2015-10-28 NOTE — Progress Notes (Signed)
Initial Nutrition Assessment  DOCUMENTATION CODES:   Obesity unspecified  INTERVENTION:   Provide Ensure Enlive po BID, each supplement provides 350 kcal and 20 grams of protein Encourage PO intake RD to continue to monitor  NUTRITION DIAGNOSIS:   Increased nutrient needs related to acute illness as evidenced by estimated needs.  GOAL:   Patient will meet greater than or equal to 90% of their needs  MONITOR:   PO intake, Supplement acceptance, Labs, Weight trends, I & O's  REASON FOR ASSESSMENT:   Malnutrition Screening Tool    ASSESSMENT:   70 y.o. male with medical history significant of CKD, chronic iron def anemia, PUD comes in with one day of melanotic stools.  Pt normally has darker stools (he is on iron infusions along with aranesp with hematology) but this am he had a large very dark smelly BM then subsequent 4 more looser stools that were also dark.  For the last day he has had worsening epigastric abdominal pain,  He has h/o PUD in the past with last scoping done in 2014.  He has not vomited.  Patient in room eating lunch tray. Observed ~80% of lunch tray. Pt states his appetite has improved since admission. He states his appetite has decreased over the past month or so. 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 Pt tried a protein supplement during the past month given his weight loss but was unable to state what brand he was drinking. He is willing to try Ensure supplements, RD to order. Per chart review, pt has lost 25 lb since 8/31 (9% wt loss x 1.5 months ,significant for time frame). Nutrition focused physical exam shows no sign of depletion of muscle mass or body fat.  Labs reviewed. Medications: Lasix tablet daily, Miralax daily  Diet Order:  DIET SOFT Room service appropriate? Yes; Fluid consistency: Thin  Skin:  Reviewed, no issues  Last BM:  10/13  Height:   Ht Readings from Last 1 Encounters:  10/26/15 6\' 2"  (1.88 m)    Weight:   Wt Readings from Last  1 Encounters:  10/26/15 264 lb 5.3 oz (119.9 kg)    Ideal Body Weight:  86.3 kg  BMI:  Body mass index is 33.94 kg/m.  Estimated Nutritional Needs:   Kcal:  3612-2449  Protein:  105-115g  Fluid:  2.3L/day  EDUCATION NEEDS:   Education needs addressed  Clayton Bibles, MS, RD, LDN Pager: [phone] After Hours Pager: [phone]

## 2015-10-28 NOTE — Progress Notes (Signed)
   Reviewed situation with patient and wife re: gastric mass suspicious for cancer.  Unenhanced CT survey is negative for mets but lack of IV contrast limits.  Wife tells me she is legally blind and needs his help at home.  He may not really need to stay much longer - he is established in cancer center with Dr. Alen Blew - so think he could go tomorrow and get outpatient appt Dr. Alen Blew or other oncologist if necessary and go from there.  Could need an EUS which we can arrange outpatient.  Do not think he is an up front surgical candidate unless it would be diagnostic staging laparoscopy first.  [name], MD, [facility] [phone] (pager) [phone] after 5 PM, weekends and holidays  10/28/2015 5:52 PM

## 2015-10-28 NOTE — Progress Notes (Signed)
Patient ambulated 180 feet with only standby assist. Tolerated well. Denied SOB or dizziness. While walking back to his room pt stated "it's starting to get to my legs." Pt able to walk back to his room with no assist.

## 2015-10-28 NOTE — Plan of Care (Signed)
Problem: Physical Regulation: Goal: Ability to maintain clinical measurements within normal limits will improve Outcome: Progressing Hgb increased to 9.6 after 3 units PRBCs since admission

## 2015-10-29 ENCOUNTER — Encounter (HOSPITAL_COMMUNITY): Payer: Self-pay | Admitting: Internal Medicine

## 2015-10-29 ENCOUNTER — Telehealth: Payer: Self-pay

## 2015-10-29 DIAGNOSIS — K922 Gastrointestinal hemorrhage, unspecified: Secondary | ICD-10-CM | POA: Diagnosis not present

## 2015-10-29 DIAGNOSIS — I10 Essential (primary) hypertension: Secondary | ICD-10-CM | POA: Diagnosis not present

## 2015-10-29 DIAGNOSIS — K319 Disease of stomach and duodenum, unspecified: Secondary | ICD-10-CM | POA: Diagnosis not present

## 2015-10-29 DIAGNOSIS — N183 Chronic kidney disease, stage 3 (moderate): Secondary | ICD-10-CM | POA: Diagnosis not present

## 2015-10-29 DIAGNOSIS — D500 Iron deficiency anemia secondary to blood loss (chronic): Secondary | ICD-10-CM

## 2015-10-29 LAB — BASIC METABOLIC PANEL
ANION GAP: 5 (ref 5–15)
BUN: 29 mg/dL — ABNORMAL HIGH (ref 6–20)
CHLORIDE: 109 mmol/L (ref 101–111)
CO2: 24 mmol/L (ref 22–32)
Calcium: 9.4 mg/dL (ref 8.9–10.3)
Creatinine, Ser: 1.92 mg/dL — ABNORMAL HIGH (ref 0.61–1.24)
GFR, EST AFRICAN AMERICAN: 39 mL/min — AB (ref >60)
GFR, EST NON AFRICAN AMERICAN: 34 mL/min — AB (ref >60)
Glucose, Bld: 113 mg/dL — ABNORMAL HIGH (ref 65–99)
POTASSIUM: 4.6 mmol/L (ref 3.5–5.1)
SODIUM: 138 mmol/L (ref 135–145)

## 2015-10-29 LAB — CBC
HCT: 27.3 % — ABNORMAL LOW (ref 39.0–52.0)
HEMOGLOBIN: 8.7 g/dL — AB (ref 13.0–17.0)
MCH: 29.1 pg (ref 26.0–34.0)
MCHC: 31.9 g/dL (ref 30.0–36.0)
MCV: 91.3 fL (ref 78.0–100.0)
PLATELETS: 196 10*3/uL (ref 150–400)
RBC: 2.99 MIL/uL — AB (ref 4.22–5.81)
RDW: 18.7 % — ABNORMAL HIGH (ref 11.5–15.5)
WBC: 6.6 10*3/uL (ref 4.0–10.5)

## 2015-10-29 LAB — PREPARE RBC (CROSSMATCH)

## 2015-10-29 MED ORDER — SODIUM CHLORIDE 0.9 % IV SOLN
Freq: Once | INTRAVENOUS | Status: AC
  Administered 2015-10-29: 09:00:00 via INTRAVENOUS

## 2015-10-29 NOTE — Discharge Summary (Signed)
Physician Discharge Summary  Jacob [name] LDJ:[id number] DOB: 01/10/1946 DOA: 10/26/2015  PCP: Tivis Ringer, MD  Admit date: 10/26/2015 Discharge date: 10/29/2015  Admitted From: Home Disposition:  Home  Recommendations for Outpatient Follow-up:  1. Follow up final pathology results from gastric biopsy 2. Please obtain CBC in 4-5 days on followup  3. Please follow up with hematology  Discharge Condition: Stable CODE STATUS: FULL  Brief/Interim Summary: HPI: Jacob Williamsonis a 70 y.o.malewith medical history significant of CKD, chronic iron def anemia, PUD comes in with one day of melanotic stools. Pt normally has darker stools (he is on iron infusions along with aranesp with hematology) but this am he had a large very dark smelly BM then subsequent 4 more looser stools that were also dark. For the last day he has had worsening epigastric abdominal pain, He has h/o PUD in the past with last scoping done in 2014. He has not vomited. He reports on/off epigastric pain for months, relieved with tums sometimes. He takes otc prilosec but has not taken this in about a week. No fevers. His hgb has been around 8 despite infusions/aranesp for several months. He sometimes requires blood transfusions. Pt denies any weakness, dizziness, sob. Pt referred for admission for possible GIB. Jacob [name] called GI on call also for consultation for possible EGD.  Assessment & Plan:   Principal Problem:   Gastrointestinal hemorrhage suspected from gastric cancer (biopsies pending) Active Problems:   Iron deficiency anemia   Essential hypertension   Prostate cancer ([place])   Anemia in chronic kidney disease   GI bleed   GI hemorrhage - Appreciate GI consultation - Pt had upper endoscopy and findings concerning for gastric malignancy (biopsies pending). Surveillance CT reviewed see results below.      Anemia- chronic blood loss, CKD - Pt is s/p 4 units PRBC in hospital. Recheck  hemoglobin outpatient with hematologist. Pt to follow up with Dr. Alen [name] in 4-5 days.       Essential Hypertension - stable, did not resume diovan at discharge as he had soft BPs.  Reassess outpatient.   History of prostate cancer - in remission.  Chronic constipation- resume home stool softeners.   CKD - stage 3 - stable at baseline.   DVT prophylaxis: SCDs Code Status: full Disposition Plan: Home   Consultants:  GI   Discharge Diagnoses:  Principal Problem:   Gastrointestinal hemorrhage Active Problems:   Iron deficiency anemia   Essential hypertension   Prostate cancer ([place])   Anemia in chronic kidney disease   GI bleed   Gastric mass  Discharge Instructions  Discharge Instructions    Increase activity slowly    Complete by:  As directed        Medication List    STOP taking these medications   bismuth subsalicylate 030 SP/23RA suspension Commonly known as:  PEPTO BISMOL   loperamide 2 MG tablet Commonly known as:  IMODIUM A-D   TYLENOL COUGH/SORE THROAT 30-12.05-998 MG/30ML Liqd Generic drug:  DM-Doxylamine-Acetaminophen   valsartan 320 MG tablet Commonly known as:  DIOVAN   VICKS SINEX MOISTURIZING 0.05 % nasal spray Generic drug:  oxymetazoline     TAKE these medications   allopurinol 300 MG tablet Commonly known as:  ZYLOPRIM Take 300 mg by mouth daily.   buPROPion 300 MG 24 hr tablet Commonly known as:  WELLBUTRIN XL Take 300 mg by mouth daily.   BYSTOLIC 20 MG Tabs Generic drug:  Nebivolol HCl Take 20 mg by mouth daily.  diazepam 5 MG tablet Commonly known as:  VALIUM Take 5 mg by mouth at bedtime.   feeding supplement (GLUCERNA SHAKE) Liqd Take 237 mLs by mouth once.   furosemide 80 MG tablet Commonly known as:  LASIX Take 40 mg by mouth daily.   gabapentin 800 MG tablet Commonly known as:  NEURONTIN Take 400-800 mg by mouth at bedtime.   influenza vac recom quadrivalent 0.5 ML injection Commonly known as:   FLUBLOK Inject 0.5 mLs into the muscle once.   latanoprost 0.005 % ophthalmic solution Commonly known as:  XALATAN Place 1 drop into both eyes at bedtime.   Melatonin 10 MG Tabs Take 1 tablet by mouth at bedtime.   omeprazole 20 MG capsule Commonly known as:  PRILOSEC Take 20 mg by mouth daily.   OXYGEN Inhale 2 L into the lungs at bedtime as needed (for shortness of breath).   tamsulosin 0.4 MG Caps capsule Commonly known as:  FLOMAX TAKE 1 CAPSULE BY MOUTH DAILY   traZODone 50 MG tablet Commonly known as:  DESYREL Take 50 mg by mouth at bedtime.   Verapamil HCl CR 300 MG Cp24 Take 300 mg by mouth at bedtime.      Follow-up Information    SHADAD,FIRAS, MD. Schedule an appointment as soon as possible for a visit in 4 day(s).   Specialty:  Oncology Why:  Hospital Follow Up Contact information: [address] [place] [address] [phone]          No Known Allergies  Procedures/Studies: Ct Abdomen Pelvis Wo Contrast  Result Date: 10/27/2015 CLINICAL DATA:  Patient with chronic renal disease. Melanotic stools. EXAM: CT CHEST, ABDOMEN AND PELVIS WITHOUT CONTRAST TECHNIQUE: Multidetector CT imaging of the chest, abdomen and pelvis was performed following the standard protocol without IV contrast. COMPARISON:  CT abdomen pelvis 06/07/2014 FINDINGS: CT CHEST FINDINGS Cardiovascular: Normal heart size. Coronary artery vascular calcifications. Low attenuation of the blood pool suggestive of anemia. Mediastinum/Nodes: No enlarged axillary, mediastinal or hilar lymphadenopathy. Esophagus is unremarkable. Lungs/Pleura: Central airways are patent. Minimal atelectasis within the lower lobes bilaterally. No large area pulmonary consolidation. No pleural effusion or pneumothorax. Musculoskeletal: No aggressive or acute appearing osseous lesions. There is a nonspecific 1.6 cm rounded mass within the subcutaneous fat overlying the left chest wall (image 32; series 2). CT ABDOMEN  PELVIS FINDINGS Hepatobiliary: Liver is normal in size and contour. Gallbladder is unremarkable. Pancreas: Unremarkable Spleen: Unremarkable Adrenals/Urinary Tract: Normal adrenal glands. Grossly unchanged bilateral perinephric fat stranding. Unchanged 6.2 cm cyst within the superior pole of the right kidney. Unchanged low-attenuation lesion within the interpolar region of the right kidney with smooth rim calcification. No hydronephrosis. No nephroureterolithiasis. Urinary bladder is unremarkable. Stomach/Bowel: Descending and sigmoid colonic diverticulosis. No CT evidence for acute diverticulitis. The appendix is normal. There is suggestion of circumferential wall thickening of the gastric antrum without surrounding fat stranding. No evidence for bowel obstruction. Vascular/Lymphatic: Infrarenal abdominal aortic ectasia measuring 2.9 cm. Peripheral calcified atherosclerotic plaque of the abdominal aorta. No retroperitoneal lymphadenopathy. Reproductive: Prostate unremarkable. Other: Left-greater-than-right bilateral fat containing inguinal hernias. Musculoskeletal: Lumbar spine degenerative changes. No aggressive or acute appearing osseous lesions. IMPRESSION: Circumferential wall thickening of the gastric antrum which is nonspecific and may be secondary to an infectious/inflammatory process or gastric malignancy/mass. Recommend correlation with upper endoscopy. Nonspecific mass within the subcutaneous fat overlying the left chest wall. Recommend dedicated evaluation physical exam and ultrasound as clinically indicated. Aortic atherosclerosis. These results will be called to the ordering clinician  or representative by the Radiologist Assistant, and communication documented in the PACS or zVision Dashboard. Electronically Signed   By: Lovey Newcomer M.D.   On: 10/27/2015 18:55   Ct Chest Wo Contrast  Result Date: 10/27/2015 CLINICAL DATA:  Patient with chronic renal disease. Melanotic stools. EXAM: CT CHEST,  ABDOMEN AND PELVIS WITHOUT CONTRAST TECHNIQUE: Multidetector CT imaging of the chest, abdomen and pelvis was performed following the standard protocol without IV contrast. COMPARISON:  CT abdomen pelvis 06/07/2014 FINDINGS: CT CHEST FINDINGS Cardiovascular: Normal heart size. Coronary artery vascular calcifications. Low attenuation of the blood pool suggestive of anemia. Mediastinum/Nodes: No enlarged axillary, mediastinal or hilar lymphadenopathy. Esophagus is unremarkable. Lungs/Pleura: Central airways are patent. Minimal atelectasis within the lower lobes bilaterally. No large area pulmonary consolidation. No pleural effusion or pneumothorax. Musculoskeletal: No aggressive or acute appearing osseous lesions. There is a nonspecific 1.6 cm rounded mass within the subcutaneous fat overlying the left chest wall (image 32; series 2). CT ABDOMEN PELVIS FINDINGS Hepatobiliary: Liver is normal in size and contour. Gallbladder is unremarkable. Pancreas: Unremarkable Spleen: Unremarkable Adrenals/Urinary Tract: Normal adrenal glands. Grossly unchanged bilateral perinephric fat stranding. Unchanged 6.2 cm cyst within the superior pole of the right kidney. Unchanged low-attenuation lesion within the interpolar region of the right kidney with smooth rim calcification. No hydronephrosis. No nephroureterolithiasis. Urinary bladder is unremarkable. Stomach/Bowel: Descending and sigmoid colonic diverticulosis. No CT evidence for acute diverticulitis. The appendix is normal. There is suggestion of circumferential wall thickening of the gastric antrum without surrounding fat stranding. No evidence for bowel obstruction. Vascular/Lymphatic: Infrarenal abdominal aortic ectasia measuring 2.9 cm. Peripheral calcified atherosclerotic plaque of the abdominal aorta. No retroperitoneal lymphadenopathy. Reproductive: Prostate unremarkable. Other: Left-greater-than-right bilateral fat containing inguinal hernias. Musculoskeletal: Lumbar  spine degenerative changes. No aggressive or acute appearing osseous lesions. IMPRESSION: Circumferential wall thickening of the gastric antrum which is nonspecific and may be secondary to an infectious/inflammatory process or gastric malignancy/mass. Recommend correlation with upper endoscopy. Nonspecific mass within the subcutaneous fat overlying the left chest wall. Recommend dedicated evaluation physical exam and ultrasound as clinically indicated. Aortic atherosclerosis. These results will be called to the ordering clinician or representative by the Radiologist Assistant, and communication documented in the PACS or zVision Dashboard. Electronically Signed   By: Lovey Newcomer M.D.   On: 10/27/2015 18:55     Subjective: Pt without complaints,  He ambulated without problems with nurse.  Wants to go home to care for his wife who is blind.  Says he will follow up with Dr. Osker Mason as we have recommended.   Discharge Exam: Vitals:   10/29/15 0839 10/29/15 0913  BP: 124/68 123/72  Pulse: 65 66  Resp: 18 16  Temp: 98.3 F (36.8 C) 97.5 F (36.4 C)   Vitals:   10/28/15 2104 10/29/15 0523 10/29/15 0839 10/29/15 0913  BP: 135/76 108/61 124/68 123/72  Pulse: 76 63 65 66  Resp: 20 18 18 16   Temp: 98.5 F (36.9 C) 98.3 F (36.8 C) 98.3 F (36.8 C) 97.5 F (36.4 C)  TempSrc: Oral Oral Oral Oral  SpO2: 96% 96% 95% 98%  Weight:      Height:       General: Pt is alert, awake, not in acute distress Cardiovascular: RRR, S1/S2 +, no rubs, no gallops Respiratory: CTA bilaterally, no wheezing, no rhonchi Abdominal: Soft, NT, ND, bowel sounds + Extremities: no edema, no cyanosis  The results of significant diagnostics from this hospitalization (including imaging, microbiology, ancillary and laboratory) are listed below  for reference.     Microbiology: No results found for this or any previous visit (from the past 240 hour(s)).   Labs: BNP (last 3 results) No results for input(s): BNP in the  last 8760 hours. Basic Metabolic Panel:  Recent Labs Lab 10/26/15 1551 10/29/15 0457  NA 136 138  K 4.8 4.6  CL 108 109  CO2 24 24  GLUCOSE 108* 113*  BUN 43* 29*  CREATININE 1.86* 1.92*  CALCIUM 9.0 9.4   Liver Function Tests: No results for input(s): AST, ALT, ALKPHOS, BILITOT, PROT, ALBUMIN in the last 168 hours. No results for input(s): LIPASE, AMYLASE in the last 168 hours. No results for input(s): AMMONIA in the last 168 hours. CBC:  Recent Labs Lab 10/25/15 0925 10/26/15 1551  10/27/15 0541 10/27/15 1510 10/28/15 0448 10/28/15 1419 10/29/15 0457  WBC 7.4 6.5  --  5.8 5.6 6.0  --  6.6  NEUTROABS 5.5 5.0  --   --   --   --   --   --   HGB 8.4* 7.8*  < > 7.2* 7.3* 8.4* 9.6* 8.7*  HCT 27.5* 24.2*  < > 21.7* 24.3* 26.1* 29.5* 27.3*  MCV 96.8 94.2  --  94.3 100.4* 91.6  --  91.3  PLT 216 228  --  192 188 194  --  196  < > = values in this interval not displayed. Cardiac Enzymes: No results for input(s): CKTOTAL, CKMB, CKMBINDEX, TROPONINI in the last 168 hours. BNP: Invalid input(s): POCBNP CBG: No results for input(s): GLUCAP in the last 168 hours. D-Dimer No results for input(s): DDIMER in the last 72 hours. Hgb A1c No results for input(s): HGBA1C in the last 72 hours. Lipid Profile No results for input(s): CHOL, HDL, LDLCALC, TRIG, CHOLHDL, LDLDIRECT in the last 72 hours. Thyroid function studies No results for input(s): TSH, T4TOTAL, T3FREE, THYROIDAB in the last 72 hours.  Invalid input(s): FREET3 Anemia work up No results for input(s): VITAMINB12, FOLATE, FERRITIN, TIBC, IRON, RETICCTPCT in the last 72 hours. Urinalysis No results found for: COLORURINE, APPEARANCEUR, LABSPEC, [place], GLUCOSEU, HGBUR, BILIRUBINUR, KETONESUR, PROTEINUR, UROBILINOGEN, NITRITE, LEUKOCYTESUR Sepsis Labs Invalid input(s): PROCALCITONIN,  WBC,  LACTICIDVEN Microbiology No results found for this or any previous visit (from the past 240 hour(s)).  Time coordinating  discharge: 31 minutes  SIGNED:  Irwin Brakeman, MD  [facility] 10/29/2015, 10:42 AM Pager   If 7PM-7AM, please contact night-coverage [email or web address] Password [email or web address]

## 2015-10-29 NOTE — Telephone Encounter (Signed)
EGD report faxed to Dr Berneta Sages for his records. Fax # [phone].

## 2015-10-29 NOTE — Discharge Instructions (Signed)
Gastrointestinal Bleeding °Gastrointestinal bleeding is bleeding somewhere along the path that food travels through the body (digestive tract). This path is anywhere between the mouth and the opening of the butt (anus). You may have blood in your throw up (vomit) or in your poop (stools). If there is a lot of bleeding, you may need to stay in the hospital. °HOME CARE °· Only take medicine as told by your doctor. °· Eat foods with fiber such as whole grains, fruits, and vegetables. You can also try eating 1 to 3 prunes a day. °· Drink enough fluids to keep your pee (urine) clear or pale yellow. °GET HELP RIGHT AWAY IF:  °· Your bleeding gets worse. °· You feel dizzy, weak, or you pass out (faint). °· You have bad cramps in your back or belly (abdomen). °· You have large blood clumps (clots) in your poop. °· Your problems are getting worse. °MAKE SURE YOU:  °· Understand these instructions. °· Will watch your condition. °· Will get help right away if you are not doing well or get worse. °  °This information is not intended to replace advice given to you by your health care provider. Make sure you discuss any questions you have with your health care provider. °  °Document Released: 10/09/2007 Document Revised: 12/17/2011 Document Reviewed: 06/19/2014 °Elsevier Interactive Patient Education ©2016 [employer] ° °

## 2015-10-29 NOTE — Progress Notes (Signed)
Spoke with Dr. Wynetta Emery, plan is to give patient one more u prbcs and then have him follow up outpatient with Dr. Alen Blew at the cancer center later this week. We will sign off. Please let us know if we may be of any further assistance. Ellouise Newer, PA-C

## 2015-10-29 NOTE — Telephone Encounter (Signed)
-----   Message from [name], MD sent at 10/27/2015  1:48 PM EDT ----- Regarding: fax please Please fax egd report to dr. Joyce Copa

## 2015-10-30 ENCOUNTER — Telehealth: Payer: Self-pay | Admitting: *Deleted

## 2015-10-30 LAB — TYPE AND SCREEN
ABO/RH(D): O POS
Antibody Screen: NEGATIVE
Unit division: 0
Unit division: 0
Unit division: 0
Unit division: 0

## 2015-10-30 NOTE — Progress Notes (Signed)
I told him biopsies confirm stomach cancer. He will be seeing Dr. Alen Blew 10/20 at 130 PM Am ccing Dr. Ardis Hughs to be aware in case EUS is necessary  Am ccing Merceda Elks re: GI oncology conference

## 2015-10-30 NOTE — Telephone Encounter (Signed)
Please let him know that we will see him on 1/20 1:30 pm.

## 2015-10-30 NOTE — Telephone Encounter (Signed)
"  I need an appointment within the next 3 - 4 days with Dr.Shadad according to my hospital discharge instructions.  A tumor was found in my stomach.  I can be reached at home [phone]."

## 2015-10-30 NOTE — Telephone Encounter (Signed)
Dr Alen Blew to see patient 11/02/15 @ 1:30. Note to schedulers

## 2015-10-30 NOTE — Progress Notes (Signed)
I explained about EUS and that we would be calling to set it up

## 2015-10-31 ENCOUNTER — Other Ambulatory Visit: Payer: Self-pay

## 2015-10-31 DIAGNOSIS — C169 Malignant neoplasm of stomach, unspecified: Secondary | ICD-10-CM

## 2015-11-01 NOTE — Telephone Encounter (Signed)
No appointment scheduled.  This nurse sent second message to scheduling.

## 2015-11-02 ENCOUNTER — Encounter (HOSPITAL_COMMUNITY): Payer: Self-pay | Admitting: *Deleted

## 2015-11-02 ENCOUNTER — Ambulatory Visit (HOSPITAL_BASED_OUTPATIENT_CLINIC_OR_DEPARTMENT_OTHER): Payer: Medicare Other | Admitting: Oncology

## 2015-11-02 VITALS — BP 124/71 | HR 72 | Temp 98.0°F | Resp 18 | Ht 74.0 in | Wt 265.4 lb

## 2015-11-02 DIAGNOSIS — C163 Malignant neoplasm of pyloric antrum: Secondary | ICD-10-CM | POA: Diagnosis not present

## 2015-11-02 DIAGNOSIS — D631 Anemia in chronic kidney disease: Secondary | ICD-10-CM | POA: Diagnosis not present

## 2015-11-02 DIAGNOSIS — Z8546 Personal history of malignant neoplasm of prostate: Secondary | ICD-10-CM

## 2015-11-02 DIAGNOSIS — D509 Iron deficiency anemia, unspecified: Secondary | ICD-10-CM | POA: Diagnosis not present

## 2015-11-02 DIAGNOSIS — N189 Chronic kidney disease, unspecified: Secondary | ICD-10-CM | POA: Diagnosis not present

## 2015-11-02 NOTE — Progress Notes (Signed)
Hematology and Oncology Follow Up Visit  Raimundo Khiev [id number] 08/20/1945 70 y.o. 11/02/2015 1:27 PM [name],RAVISANKAR R, MDAvva, Ravisankar, MD   Principle Diagnosis:   70-year-old gentleman with: 1.Normocytic, normochromic anemia diagnosed March 2017. His anemia is due to renal insufficiency with creatinine of 2.9 creatinine clearance 44 mL/m.  2. Prostate cancer in 2013 with a Gleason score 3+4 = 7 and a PSA 5.66 stage TIc.  3. 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   Prior Therapy:  He is status post radiation therapy for definitive treatment for his prostate cancer. Therapy concluded in December 2013. He is status post packed red cell transfusions in April 2017. He is status post bone marrow biopsy in April 2017. Results did not show any plasma cell disorder or myelodysplasia. He is status post IV iron infusion completed in April 2017. This was repeated in September 2017.   Current therapy: Aranesp 300 g injection every 2 weeks to get his hemoglobin to 10.  Interim History: Mr. Trindade presents today for a follow-up visit. Since the last visit, he was hospitalized on 10/27/2015 after presenting with melena. His workup revealed positive Hemoccult testing and an elevated CEA of 100.9. CT scan of the abdomen and pelvis on 10/27/2015 showed a and total mass suspicious for malignancy. He underwent an endoscopy and a biopsy which confirmed the presence of adenocarcinoma. He is scheduled to have an EUS next week.  Since his discharge, he normal reporting any melena, hematochezia or epigastric pain. He has resumed all activities of daily living without any decline. He denied any nausea, vomiting or abdominal discomfort. He presented with a hemoglobin of 7.2 and received packed red cell transfusion and his hemoglobin upon discharge was 8.7.  He does not report any headaches, blurry vision, syncope or seizures. He does not report any fevers, chills, sweats  or weight loss. Does not report any chest pain, palpitation, orthopnea or leg edema. He does not report any cough, wheezing or hemoptysis. He does not report any frequency, urgency or hesitancy. He does not report any skeletal complaints of arthralgias or myalgias. Remaining review of systems unremarkable.   Medications: I have reviewed the patient's current medications.  Current Outpatient Prescriptions  Medication Sig Dispense Refill  . allopurinol (ZYLOPRIM) 300 MG tablet Take 300 mg by mouth daily.     . buPROPion (WELLBUTRIN XL) 300 MG 24 hr tablet Take 300 mg by mouth daily.     . diazepam (VALIUM) 5 MG tablet Take 5 mg by mouth at bedtime.    . feeding supplement, GLUCERNA SHAKE, (GLUCERNA SHAKE) LIQD Take 237 mLs by mouth once.    . furosemide (LASIX) 80 MG tablet Take 40 mg by mouth daily.     . gabapentin (NEURONTIN) 800 MG tablet Take 400-800 mg by mouth at bedtime.     . influenza vac recom quadrivalent (FLUBLOK) 0.5 ML injection Inject 0.5 mLs into the muscle once.    . latanoprost (XALATAN) 0.005 % ophthalmic solution Place 1 drop into both eyes at bedtime.    . Melatonin 10 MG TABS Take 1 tablet by mouth at bedtime.     . Nebivolol HCl (BYSTOLIC) 20 MG TABS Take 20 mg by mouth daily.     . omeprazole (PRILOSEC) 20 MG capsule Take 20 mg by mouth daily.    . OXYGEN Inhale 2 L into the lungs at bedtime as needed (for shortness of breath).    . tamsulosin (FLOMAX) 0.4 MG CAPS capsule TAKE 1 CAPSULE   BY MOUTH DAILY 30 capsule 2  . traZODone (DESYREL) 50 MG tablet Take 50 mg by mouth at bedtime.     . Verapamil HCl CR 300 MG CP24 Take 300 mg by mouth at bedtime.      No current facility-administered medications for this visit.      Allergies: No Known Allergies  Past Medical History, Surgical history, Social history, and Family History were reviewed and updated.   Physical Exam: Blood pressure 124/71, pulse 72, temperature 98 F (36.7 C), temperature source Oral, resp. rate  18, height 6' 2" (1.88 m), weight 265 lb 6.4 oz (120.4 kg), SpO2 97 %. ECOG: 1 General appearance: Alert, awake gentleman without distress.. Head: Normocephalic, without obvious abnormality no oral thrush noted. Neck: no adenopathy Lymph nodes: Cervical, supraclavicular, and axillary nodes normal. Heart:regular rate and rhythm, S1, S2 normal, no murmur, click, rub or gallop Lung:chest clear, no wheezing, rales, normal symmetric air entry Abdomin: soft, non-tender, without masses or organomegaly no shifting dullness or ascites.. EXT:no erythema, induration, or nodules   Lab Results: Lab Results  Component Value Date   WBC 6.6 10/29/2015   HGB 8.7 (L) 10/29/2015   HCT 27.3 (L) 10/29/2015   MCV 91.3 10/29/2015   PLT 196 10/29/2015     Chemistry      Component Value Date/Time   NA 138 10/29/2015 0457   NA 139 09/27/2015 0834   K 4.6 10/29/2015 0457   K 4.7 09/27/2015 0834   CL 109 10/29/2015 0457   CO2 24 10/29/2015 0457   CO2 20 (L) 09/27/2015 0834   BUN 29 (H) 10/29/2015 0457   BUN 28.3 (H) 09/27/2015 0834   CREATININE 1.92 (H) 10/29/2015 0457   CREATININE 2.1 (H) 09/27/2015 0834      Component Value Date/Time   CALCIUM 9.4 10/29/2015 0457   CALCIUM 9.1 09/27/2015 0834   ALKPHOS 49 09/27/2015 0834   AST 18 09/27/2015 0834   ALT 12 09/27/2015 0834   BILITOT 0.36 09/27/2015 0834     EXAM: CT CHEST, ABDOMEN AND PELVIS WITHOUT CONTRAST  TECHNIQUE: Multidetector CT imaging of the chest, abdomen and pelvis was performed following the standard protocol without IV contrast.  COMPARISON:  CT abdomen pelvis 06/07/2014  FINDINGS: CT CHEST FINDINGS  Cardiovascular: Normal heart size. Coronary artery vascular calcifications. Low attenuation of the blood pool suggestive of anemia.  Mediastinum/Nodes: No enlarged axillary, mediastinal or hilar lymphadenopathy. Esophagus is unremarkable.  Lungs/Pleura: Central airways are patent. Minimal atelectasis within the  lower lobes bilaterally. No large area pulmonary consolidation. No pleural effusion or pneumothorax.  Musculoskeletal: No aggressive or acute appearing osseous lesions. There is a nonspecific 1.6 cm rounded mass within the subcutaneous fat overlying the left chest wall (image 32; series 2).  CT ABDOMEN PELVIS FINDINGS  Hepatobiliary: Liver is normal in size and contour. Gallbladder is unremarkable.  Pancreas: Unremarkable  Spleen: Unremarkable  Adrenals/Urinary Tract: Normal adrenal glands. Grossly unchanged bilateral perinephric fat stranding. Unchanged 6.2 cm cyst within the superior pole of the right kidney. Unchanged low-attenuation lesion within the interpolar region of the right kidney with smooth rim calcification. No hydronephrosis. No nephroureterolithiasis. Urinary bladder is unremarkable.  Stomach/Bowel: Descending and sigmoid colonic diverticulosis. No CT evidence for acute diverticulitis. The appendix is normal. There is suggestion of circumferential wall thickening of the gastric antrum without surrounding fat stranding. No evidence for bowel obstruction.  Vascular/Lymphatic: Infrarenal abdominal aortic ectasia measuring 2.9 cm. Peripheral calcified atherosclerotic plaque of the abdominal aorta. No retroperitoneal lymphadenopathy.  Reproductive:   Prostate unremarkable.  Other: Left-greater-than-right bilateral fat containing inguinal hernias.  Musculoskeletal: Lumbar spine degenerative changes. No aggressive or acute appearing osseous lesions.  IMPRESSION: Circumferential wall thickening of the gastric antrum which is nonspecific and may be secondary to an infectious/inflammatory process or gastric malignancy/mass. Recommend correlation with upper endoscopy.  Nonspecific mass within the subcutaneous fat overlying the left chest wall. Recommend dedicated evaluation physical exam and ultrasound as clinically indicated.   Impression and  Plan:  69-year-old woman with the following issues:  1. Normocytic, normochromic anemia presented initially with a hemoglobin of 9.2 now has drifted down to 6.0. His MCV is normal at 97 but does have an elevated RDW. His anemia is related to renal insufficiency as well as iron deficiency.   He is currently receiving Aranesp every 2 weeks to keep his hemoglobin above 10. We will also receive intravenous iron intermittently. He does require packet cell transfusion periodically if his hemoglobin drops below 8.  His anemia could also be related to chronic blood losses from his newly found gastric adenocarcinoma.  2. Antral mass that is biopsy proven to be adenocarcinoma. He is scheduled to have an EUS and a PET scan will be scheduled as well to complete the staging work up. The natural course of this disease was discussed today with the patient as well as different treatment strategies. Before concrete plan is formulated, he will require complete staging workup including EUS and PET scan. Treatment strategy would include surgical resection and possibly chemotherapy with radiation. He will be treated entire multidisciplinary fashion once his staging workup is complete.  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 His last protein studies obtained in September 2017 showed no major changes and will be repeated annually.  4. Renal insufficiency: Followed by his primary care provider and my require nephrology follow-up in the future.  5. Follow-up: He follow-up in 2 weeks after completing his workup.   Le Faulcon, MD 10/20/20171:27 PM 

## 2015-11-07 ENCOUNTER — Encounter (HOSPITAL_COMMUNITY): Payer: Self-pay | Admitting: Anesthesiology

## 2015-11-07 NOTE — Anesthesia Preprocedure Evaluation (Signed)
Anesthesia Evaluation  Patient identified by MRN, date of birth, ID band Patient awake    Reviewed: Allergy & Precautions, NPO status , Patient's Chart, lab work & pertinent test results  Airway Mallampati: II  TM Distance: >3 FB Neck ROM: Full    Dental no notable dental hx.    Pulmonary neg pulmonary ROS, former smoker,    Pulmonary exam normal breath sounds clear to auscultation       Cardiovascular hypertension, Normal cardiovascular exam Rhythm:Regular Rate:Normal     Neuro/Psych negative neurological ROS  negative psych ROS   GI/Hepatic Neg liver ROS, PUD,   Endo/Other  negative endocrine ROS  Renal/GU Renal disease  negative genitourinary   Musculoskeletal  (+) Arthritis ,   Abdominal (+) + obese,   Peds negative pediatric ROS (+)  Hematology  (+) anemia ,   Anesthesia Other Findings   Reproductive/Obstetrics negative OB ROS                             Anesthesia Physical Anesthesia Plan  ASA: III  Anesthesia Plan: MAC   Post-op Pain Management:    Induction: Intravenous  Airway Management Planned: Natural Airway  Additional Equipment:   Intra-op Plan:   Post-operative Plan:   Informed Consent: I have reviewed the patients History and Physical, chart, labs and discussed the procedure including the risks, benefits and alternatives for the proposed anesthesia with the patient or authorized representative who has indicated his/her understanding and acceptance.   Dental advisory given  Plan Discussed with: CRNA  Anesthesia Plan Comments:         Anesthesia Quick Evaluation

## 2015-11-08 ENCOUNTER — Encounter (HOSPITAL_COMMUNITY)
Admission: RE | Disposition: A | Discharge status: Home / Self Care | Payer: Self-pay | Source: Ambulatory Visit | Attending: Gastroenterology

## 2015-11-08 ENCOUNTER — Ambulatory Visit (HOSPITAL_COMMUNITY): Payer: Medicare Other | Admitting: Anesthesiology

## 2015-11-08 ENCOUNTER — Ambulatory Visit (HOSPITAL_COMMUNITY)
Admission: RE | Admit: 2015-11-08 | Discharge: 2015-11-08 | Disposition: A | Discharge status: Home / Self Care | Payer: Medicare Other | Source: Ambulatory Visit | Attending: Gastroenterology | Admitting: Gastroenterology

## 2015-11-08 ENCOUNTER — Encounter (HOSPITAL_COMMUNITY): Payer: Self-pay | Admitting: Anesthesiology

## 2015-11-08 ENCOUNTER — Other Ambulatory Visit: Payer: Medicare Other

## 2015-11-08 ENCOUNTER — Ambulatory Visit: Payer: Medicare Other

## 2015-11-08 DIAGNOSIS — C169 Malignant neoplasm of stomach, unspecified: Secondary | ICD-10-CM

## 2015-11-08 DIAGNOSIS — I77811 Abdominal aortic ectasia: Secondary | ICD-10-CM | POA: Diagnosis not present

## 2015-11-08 DIAGNOSIS — I700 Atherosclerosis of aorta: Secondary | ICD-10-CM | POA: Diagnosis not present

## 2015-11-08 DIAGNOSIS — C163 Malignant neoplasm of pyloric antrum: Secondary | ICD-10-CM

## 2015-11-08 DIAGNOSIS — Z79899 Other long term (current) drug therapy: Secondary | ICD-10-CM | POA: Insufficient documentation

## 2015-11-08 DIAGNOSIS — Z8546 Personal history of malignant neoplasm of prostate: Secondary | ICD-10-CM | POA: Insufficient documentation

## 2015-11-08 DIAGNOSIS — D472 Monoclonal gammopathy: Secondary | ICD-10-CM | POA: Insufficient documentation

## 2015-11-08 DIAGNOSIS — D509 Iron deficiency anemia, unspecified: Secondary | ICD-10-CM | POA: Diagnosis not present

## 2015-11-08 DIAGNOSIS — K5730 Diverticulosis of large intestine without perforation or abscess without bleeding: Secondary | ICD-10-CM | POA: Diagnosis not present

## 2015-11-08 DIAGNOSIS — Z87891 Personal history of nicotine dependence: Secondary | ICD-10-CM | POA: Insufficient documentation

## 2015-11-08 DIAGNOSIS — K3189 Other diseases of stomach and duodenum: Secondary | ICD-10-CM | POA: Diagnosis not present

## 2015-11-08 DIAGNOSIS — Z9981 Dependence on supplemental oxygen: Secondary | ICD-10-CM | POA: Diagnosis not present

## 2015-11-08 DIAGNOSIS — K4020 Bilateral inguinal hernia, without obstruction or gangrene, not specified as recurrent: Secondary | ICD-10-CM | POA: Insufficient documentation

## 2015-11-08 DIAGNOSIS — M1990 Unspecified osteoarthritis, unspecified site: Secondary | ICD-10-CM | POA: Insufficient documentation

## 2015-11-08 DIAGNOSIS — N289 Disorder of kidney and ureter, unspecified: Secondary | ICD-10-CM | POA: Insufficient documentation

## 2015-11-08 DIAGNOSIS — Z923 Personal history of irradiation: Secondary | ICD-10-CM | POA: Diagnosis not present

## 2015-11-08 HISTORY — PX: EUS: SHX5427

## 2015-11-08 SURGERY — UPPER ENDOSCOPIC ULTRASOUND (EUS) RADIAL
Anesthesia: Monitor Anesthesia Care

## 2015-11-08 MED ORDER — PROPOFOL 10 MG/ML IV BOLUS
INTRAVENOUS | Status: AC
  Filled 2015-11-08: qty 40

## 2015-11-08 MED ORDER — LACTATED RINGERS IV SOLN
INTRAVENOUS | Status: DC | PRN
  Administered 2015-11-08: 07:00:00 via INTRAVENOUS

## 2015-11-08 MED ORDER — PROPOFOL 10 MG/ML IV BOLUS
INTRAVENOUS | Status: DC | PRN
  Administered 2015-11-08 (×4): 20 mg via INTRAVENOUS
  Administered 2015-11-08 (×2): 40 mg via INTRAVENOUS
  Administered 2015-11-08 (×2): 10 mg via INTRAVENOUS
  Administered 2015-11-08: 40 mg via INTRAVENOUS

## 2015-11-08 MED ORDER — LACTATED RINGERS IV SOLN
INTRAVENOUS | Status: DC
  Administered 2015-11-08: 1000 mL via INTRAVENOUS

## 2015-11-08 MED ORDER — SODIUM CHLORIDE 0.9 % IV SOLN: INTRAVENOUS | Status: DC

## 2015-11-08 NOTE — Op Note (Addendum)
[facility] Patient Name: Jacob [name] Procedure Date: 11/08/2015 MRN: [id number] Attending MD: [name] , MD Date of Birth: 1945-02-26 CSN: [id number] Age: 70 Admit Type: Outpatient Procedure:                Upper EUS Indications:              Pre-treatment staging of gastric adenocarcinoma Providers:                [name], MD, Laverta Baltimore RN, RN,                            William Dalton, Technician Referring MD:             Silvano Rusk, MD; Zola Button, MD Medicines:                Monitored Anesthesia Care Complications:            No immediate complications. Estimated blood loss:                            None. Estimated Blood Loss:     Estimated blood loss: none. Procedure:                Pre-Anesthesia Assessment:                           - Prior to the procedure, a History and Physical                            was performed, and patient medications and                            allergies were reviewed. The patient's tolerance of                            previous anesthesia was also reviewed. The risks                            and benefits of the procedure and the sedation                            options and risks were discussed with the patient.                            All questions were answered, and informed consent                            was obtained. Prior Anticoagulants: The patient has                            taken no previous anticoagulant or antiplatelet                            agents. ASA Grade Assessment: III - A patient with  severe systemic disease. After reviewing the risks                            and benefits, the patient was deemed in                            satisfactory condition to undergo the procedure.                           After obtaining informed consent, the endoscope was                            passed under direct vision. Throughout the                         procedure, the patient's blood pressure, pulse, and                            oxygen saturations were monitored continuously. The                            was introduced through the mouth, and advanced to                            the duodenal bulb. The upper EUS was accomplished                            without difficulty. The patient tolerated the                            procedure well. Scope In: Scope Out: Findings:      Endoscopic Finding :      1. There was a large, fungating and ulcerated, partially circumferential       (involving two-thirds of the lumen circumference) mass with no bleeding       was found in the gastric antrum extending into the proximal duodenal       bulb. The mass was 6-7cm in length. Previous biopsies proved       adenocarcinoma.      2. Medium amount of retained solid food in the stomach.      Endosonographic Finding :      1. The distal gastric mass above correlates with a hypoechoic and       heterogenous lesion which measured 25 mm in thickness. The       endosonographic borders were poorly-defined. There was sonographic       evidence suggesting invasion into the serosa (Layer 5, uT3).      2. There were three small (3-45mm) but suspicious perigastric lymphnodes       near the mass. These are presumed to be positive for malignant       involvement. (uN2)      3. No ascites.      4. Limited view of left lobe of liver, pancreas, spleen were normal [facility]) Impression:               - [id number] non-circumerfential, distal gastric  adenocarcinoma which extends into the proximal                            duodenal bulb. The mass is 6-7cm in cranial caudal                            length and 2.5cm in maximal thickness.                           - EUS is not optimal for assessing malignant                            involvement of perigastric adenopathy, but the                            nodes that I saw  are suspicious for malignant                            involvement. Moderate Sedation:      N/A- Per Anesthesia Care Recommendation:           - Discharge patient to home (ambulatory).                           - He may benefit from neoadjuvant therapy prior to                            surgical attempt. Procedure Code(s):        --- Professional ---                           [phone], Esophagogastroduodenoscopy, flexible,                            transoral; with endoscopic ultrasound examination                            limited to the esophagus, stomach or duodenum, and                            adjacent structures Diagnosis Code(s):        --- Professional ---                           C16.3, Malignant neoplasm of pyloric antrum                           K31.89, Other diseases of stomach and duodenum CPT copyright 2016 [employer]. All rights reserved. The codes documented in this report are preliminary and upon coder review may  be revised to meet current compliance requirements. [name], MD 11/08/2015 8:06:35 AM This report has been signed electronically. Number of Addenda: 0

## 2015-11-08 NOTE — Transfer of Care (Signed)
Immediate Anesthesia Transfer of Care Note  Patient: Jacob [name]  Procedure(s) Performed: Procedure(s): UPPER ENDOSCOPIC ULTRASOUND (EUS) RADIAL (N/A)  Patient Location: PACU and Endoscopy Unit  Anesthesia Type:MAC  Level of Consciousness: awake, oriented, patient cooperative, lethargic and responds to stimulation  Airway & Oxygen Therapy: Patient Spontanous Breathing and Patient connected to nasal cannula oxygen  Post-op Assessment: Report given to RN, Post -op Vital signs reviewed and stable and Patient moving all extremities  Post vital signs: Reviewed and stable  Last Vitals:  Vitals:   11/08/15 0658  BP: (!) 145/80  Pulse: 76  Resp: 18  Temp: 36.8 C    Last Pain:  Vitals:   11/08/15 0658  TempSrc: Oral         Complications: No apparent anesthesia complications

## 2015-11-08 NOTE — Interval H&P Note (Signed)
History and Physical Interval Note:  11/08/2015 7:18 AM  Jacob [name]  has presented today for surgery, with the diagnosis of gastric adenocarcinoma staging  The various methods of treatment have been discussed with the patient and family. After consideration of risks, benefits and other options for treatment, the patient has consented to  Procedure(s): UPPER ENDOSCOPIC ULTRASOUND (EUS) RADIAL (N/A) as a surgical intervention .  The patient's history has been reviewed, patient examined, no change in status, stable for surgery.  I have reviewed the patient's chart and labs.  Questions were answered to the patient's satisfaction.     [name]

## 2015-11-08 NOTE — Anesthesia Postprocedure Evaluation (Signed)
Anesthesia Post Note  Patient: Janthony Holleman  Procedure(s) Performed: Procedure(s) (LRB): UPPER ENDOSCOPIC ULTRASOUND (EUS) RADIAL (N/A)  Patient location during evaluation: PACU Anesthesia Type: MAC Level of consciousness: awake and alert Pain management: pain level controlled Vital Signs Assessment: post-procedure vital signs reviewed and stable Respiratory status: spontaneous breathing, nonlabored ventilation, respiratory function stable and patient connected to nasal cannula oxygen Cardiovascular status: stable and blood pressure returned to baseline Anesthetic complications: no    Last Vitals:  Vitals:   11/08/15 0810 11/08/15 0820  BP: (!) 148/86 (!) 152/78  Pulse:    Resp:    Temp:      Last Pain:  Vitals:   11/08/15 0758  TempSrc: Oral                 Jahmal Dunavant J

## 2015-11-08 NOTE — H&P (View-Only) (Signed)
Hematology and Oncology Follow Up Visit  Jacob [name] [id number] 06/24/1945 70 y.o. 11/02/2015 1:27 PM [name],RAVISANKAR R, MDAvva, Ravisankar, MD   Principle Diagnosis:   70-year-old gentleman with: 1.Normocytic, normochromic anemia diagnosed March 2017. His anemia is due to renal insufficiency with creatinine of 2.9 creatinine clearance 44 mL/m.  2. Prostate cancer in 2013 with a Gleason score 3+4 = 7 and a PSA 5.66 stage TIc.  3. 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   Prior Therapy:  He is status post radiation therapy for definitive treatment for his prostate cancer. Therapy concluded in December 2013. He is status post packed red cell transfusions in April 2017. He is status post bone marrow biopsy in April 2017. Results did not show any plasma cell disorder or myelodysplasia. He is status post IV iron infusion completed in April 2017. This was repeated in September 2017.   Current therapy: Aranesp 300 g injection every 2 weeks to get his hemoglobin to 10.  Interim History: Jacob [name] presents today for a follow-up visit. Since the last visit, he was hospitalized on 10/27/2015 after presenting with melena. His workup revealed positive Hemoccult testing and an elevated CEA of 100.9. CT scan of the abdomen and pelvis on 10/27/2015 showed a and total mass suspicious for malignancy. He underwent an endoscopy and a biopsy which confirmed the presence of adenocarcinoma. He is scheduled to have an EUS next week.  Since his discharge, he normal reporting any melena, hematochezia or epigastric pain. He has resumed all activities of daily living without any decline. He denied any nausea, vomiting or abdominal discomfort. He presented with a hemoglobin of 7.2 and received packed red cell transfusion and his hemoglobin upon discharge was 8.7.  He does not report any headaches, blurry vision, syncope or seizures. He does not report any fevers, chills, sweats  or weight loss. Does not report any chest pain, palpitation, orthopnea or leg edema. He does not report any cough, wheezing or hemoptysis. He does not report any frequency, urgency or hesitancy. He does not report any skeletal complaints of arthralgias or myalgias. Remaining review of systems unremarkable.   Medications: I have reviewed the patient's current medications.  Current Outpatient Prescriptions  Medication Sig Dispense Refill  . allopurinol (ZYLOPRIM) 300 MG tablet Take 300 mg by mouth daily.     . buPROPion (WELLBUTRIN XL) 300 MG 24 hr tablet Take 300 mg by mouth daily.     . diazepam (VALIUM) 5 MG tablet Take 5 mg by mouth at bedtime.    . feeding supplement, GLUCERNA SHAKE, (GLUCERNA SHAKE) LIQD Take 237 mLs by mouth once.    . furosemide (LASIX) 80 MG tablet Take 40 mg by mouth daily.     . gabapentin (NEURONTIN) 800 MG tablet Take 400-800 mg by mouth at bedtime.     . influenza vac recom quadrivalent (FLUBLOK) 0.5 ML injection Inject 0.5 mLs into the muscle once.    . latanoprost (XALATAN) 0.005 % ophthalmic solution Place 1 drop into both eyes at bedtime.    . Melatonin 10 MG TABS Take 1 tablet by mouth at bedtime.     . Nebivolol HCl (BYSTOLIC) 20 MG TABS Take 20 mg by mouth daily.     . omeprazole (PRILOSEC) 20 MG capsule Take 20 mg by mouth daily.    . OXYGEN Inhale 2 L into the lungs at bedtime as needed (for shortness of breath).    . tamsulosin (FLOMAX) 0.4 MG CAPS capsule TAKE 1 CAPSULE   BY MOUTH DAILY 30 capsule 2  . traZODone (DESYREL) 50 MG tablet Take 50 mg by mouth at bedtime.     . Verapamil HCl CR 300 MG CP24 Take 300 mg by mouth at bedtime.      No current facility-administered medications for this visit.      Allergies: No Known Allergies  Past Medical History, Surgical history, Social history, and Family History were reviewed and updated.   Physical Exam: Blood pressure 124/71, pulse 72, temperature 98 F (36.7 C), temperature source Oral, resp. rate  18, height 6' 2" (1.88 m), weight 265 lb 6.4 oz (120.4 kg), SpO2 97 %. ECOG: 1 General appearance: Alert, awake gentleman without distress.. Head: Normocephalic, without obvious abnormality no oral thrush noted. Neck: no adenopathy Lymph nodes: Cervical, supraclavicular, and axillary nodes normal. Heart:regular rate and rhythm, S1, S2 normal, no murmur, click, rub or gallop Lung:chest clear, no wheezing, rales, normal symmetric air entry Abdomin: soft, non-tender, without masses or organomegaly no shifting dullness or ascites.. EXT:no erythema, induration, or nodules   Lab Results: Lab Results  Component Value Date   WBC 6.6 10/29/2015   HGB 8.7 (L) 10/29/2015   HCT 27.3 (L) 10/29/2015   MCV 91.3 10/29/2015   PLT 196 10/29/2015     Chemistry      Component Value Date/Time   NA 138 10/29/2015 0457   NA 139 09/27/2015 0834   K 4.6 10/29/2015 0457   K 4.7 09/27/2015 0834   CL 109 10/29/2015 0457   CO2 24 10/29/2015 0457   CO2 20 (L) 09/27/2015 0834   BUN 29 (H) 10/29/2015 0457   BUN 28.3 (H) 09/27/2015 0834   CREATININE 1.92 (H) 10/29/2015 0457   CREATININE 2.1 (H) 09/27/2015 0834      Component Value Date/Time   CALCIUM 9.4 10/29/2015 0457   CALCIUM 9.1 09/27/2015 0834   ALKPHOS 49 09/27/2015 0834   AST 18 09/27/2015 0834   ALT 12 09/27/2015 0834   BILITOT 0.36 09/27/2015 0834     EXAM: CT CHEST, ABDOMEN AND PELVIS WITHOUT CONTRAST  TECHNIQUE: Multidetector CT imaging of the chest, abdomen and pelvis was performed following the standard protocol without IV contrast.  COMPARISON:  CT abdomen pelvis 06/07/2014  FINDINGS: CT CHEST FINDINGS  Cardiovascular: Normal heart size. Coronary artery vascular calcifications. Low attenuation of the blood pool suggestive of anemia.  Mediastinum/Nodes: No enlarged axillary, mediastinal or hilar lymphadenopathy. Esophagus is unremarkable.  Lungs/Pleura: Central airways are patent. Minimal atelectasis within the  lower lobes bilaterally. No large area pulmonary consolidation. No pleural effusion or pneumothorax.  Musculoskeletal: No aggressive or acute appearing osseous lesions. There is a nonspecific 1.6 cm rounded mass within the subcutaneous fat overlying the left chest wall (image 32; series 2).  CT ABDOMEN PELVIS FINDINGS  Hepatobiliary: Liver is normal in size and contour. Gallbladder is unremarkable.  Pancreas: Unremarkable  Spleen: Unremarkable  Adrenals/Urinary Tract: Normal adrenal glands. Grossly unchanged bilateral perinephric fat stranding. Unchanged 6.2 cm cyst within the superior pole of the right kidney. Unchanged low-attenuation lesion within the interpolar region of the right kidney with smooth rim calcification. No hydronephrosis. No nephroureterolithiasis. Urinary bladder is unremarkable.  Stomach/Bowel: Descending and sigmoid colonic diverticulosis. No CT evidence for acute diverticulitis. The appendix is normal. There is suggestion of circumferential wall thickening of the gastric antrum without surrounding fat stranding. No evidence for bowel obstruction.  Vascular/Lymphatic: Infrarenal abdominal aortic ectasia measuring 2.9 cm. Peripheral calcified atherosclerotic plaque of the abdominal aorta. No retroperitoneal lymphadenopathy.  Reproductive:   Prostate unremarkable.  Other: Left-greater-than-right bilateral fat containing inguinal hernias.  Musculoskeletal: Lumbar spine degenerative changes. No aggressive or acute appearing osseous lesions.  IMPRESSION: Circumferential wall thickening of the gastric antrum which is nonspecific and may be secondary to an infectious/inflammatory process or gastric malignancy/mass. Recommend correlation with upper endoscopy.  Nonspecific mass within the subcutaneous fat overlying the left chest wall. Recommend dedicated evaluation physical exam and ultrasound as clinically indicated.   Impression and  Plan:  69-year-old woman with the following issues:  1. Normocytic, normochromic anemia presented initially with a hemoglobin of 9.2 now has drifted down to 6.0. His MCV is normal at 97 but does have an elevated RDW. His anemia is related to renal insufficiency as well as iron deficiency.   He is currently receiving Aranesp every 2 weeks to keep his hemoglobin above 10. We will also receive intravenous iron intermittently. He does require packet cell transfusion periodically if his hemoglobin drops below 8.  His anemia could also be related to chronic blood losses from his newly found gastric adenocarcinoma.  2. Antral mass that is biopsy proven to be adenocarcinoma. He is scheduled to have an EUS and a PET scan will be scheduled as well to complete the staging work up. The natural course of this disease was discussed today with the patient as well as different treatment strategies. Before concrete plan is formulated, he will require complete staging workup including EUS and PET scan. Treatment strategy would include surgical resection and possibly chemotherapy with radiation. He will be treated entire multidisciplinary fashion once his staging workup is complete.  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 His last protein studies obtained in September 2017 showed no major changes and will be repeated annually.  4. Renal insufficiency: Followed by his primary care provider and my require nephrology follow-up in the future.  5. Follow-up: He follow-up in 2 weeks after completing his workup.   ,, MD 10/20/20171:27 PM 

## 2015-11-08 NOTE — Discharge Instructions (Signed)
YOU HAD AN ENDOSCOPIC PROCEDURE TODAY: Refer to the procedure report and other information in the discharge instructions given to you for any specific questions about what was found during the examination. If this information does not answer your questions, please call [place] office at [phone] to clarify.  ° °YOU SHOULD EXPECT: Some feelings of bloating in the abdomen. Passage of more gas than usual. Walking can help get rid of the air that was put into your GI tract during the procedure and reduce the bloating. If you had a lower endoscopy (such as a colonoscopy or flexible sigmoidoscopy) you may notice spotting of blood in your stool or on the toilet paper. Some abdominal soreness may be present for a day or two, also. ° °DIET: Your first meal following the procedure should be a light meal and then it is ok to progress to your normal diet. A half-sandwich or bowl of soup is an example of a good first meal. Heavy or fried foods are harder to digest and may make you feel nauseous or bloated. Drink plenty of fluids but you should avoid alcoholic beverages for 24 hours. If you had a esophageal dilation, please see attached instructions for diet.   ° °ACTIVITY: Your care partner should take you home directly after the procedure. You should plan to take it easy, moving slowly for the rest of the day. You can resume normal activity the day after the procedure however YOU SHOULD NOT DRIVE, use power tools, machinery or perform tasks that involve climbing or major physical exertion for 24 hours (because of the sedation medicines used during the test).  ° °SYMPTOMS TO REPORT IMMEDIATELY: °A gastroenterologist can be reached at any hour. Please call [phone]  for any of the following symptoms:  °Following lower endoscopy (colonoscopy, flexible sigmoidoscopy) °Excessive amounts of blood in the stool  °Significant tenderness, worsening of abdominal pains  °Swelling of the abdomen that is new, acute  °Fever of 100° or  higher  °Following upper endoscopy (EGD, EUS, ERCP, esophageal dilation) °Vomiting of blood or coffee ground material  °New, significant abdominal pain  °New, significant chest pain or pain under the shoulder blades  °Painful or persistently difficult swallowing  °New shortness of breath  °Black, tarry-looking or red, bloody stools ° °FOLLOW UP:  °If any biopsies were taken you will be contacted by phone or by letter within the next 1-3 weeks. Call [phone]  if you have not heard about the biopsies in 3 weeks.  °Please also call with any specific questions about appointments or follow up tests. ° °

## 2015-11-09 ENCOUNTER — Encounter (HOSPITAL_COMMUNITY): Payer: Self-pay | Admitting: Gastroenterology

## 2015-11-12 ENCOUNTER — Ambulatory Visit (HOSPITAL_COMMUNITY)
Admission: RE | Admit: 2015-11-12 | Discharge: 2015-11-12 | Disposition: A | Discharge status: Home / Self Care | Payer: Medicare Other | Source: Ambulatory Visit | Attending: Oncology | Admitting: Oncology

## 2015-11-12 DIAGNOSIS — C163 Malignant neoplasm of pyloric antrum: Secondary | ICD-10-CM

## 2015-11-12 DIAGNOSIS — I2510 Atherosclerotic heart disease of native coronary artery without angina pectoris: Secondary | ICD-10-CM | POA: Diagnosis not present

## 2015-11-12 DIAGNOSIS — I723 Aneurysm of iliac artery: Secondary | ICD-10-CM | POA: Diagnosis not present

## 2015-11-12 DIAGNOSIS — I700 Atherosclerosis of aorta: Secondary | ICD-10-CM | POA: Insufficient documentation

## 2015-11-12 LAB — GLUCOSE, CAPILLARY: Glucose-Capillary: 93 mg/dL (ref 65–99)

## 2015-11-12 IMAGING — CT NM PET TUM IMG INITIAL (PI) SKULL BASE T - THIGH
8 series · 25 of 25 positions shown · non-contrast
Comparison: CT chest abdomen pelvis [DATE].

CLINICAL DATA: Initial treatment strategy for gastric cancer.

EXAM:
NUCLEAR MEDICINE PET SKULL BASE TO THIGH
TECHNIQUE: 12.9 mCi F-18 FDG was injected intravenously. Full-ring PET imaging
was performed from the skull base to thigh after the radiotracer. CT
data was obtained and used for attenuation correction and anatomic
localization.
FASTING BLOOD GLUCOSE:  Value: 93 mg/dl

[Series 3: pet sk_thigh ac · axial · 5.0mm · 4.07mm/px · z∈[-1380,-448]mm · 5 of 234 slices shown]
[im 1/234]
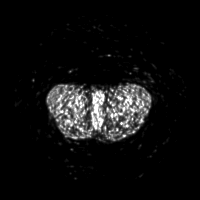
[im 59/234]
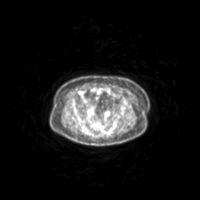
[im 117/234]
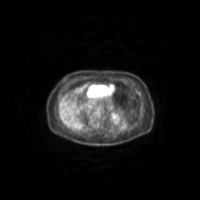
[im 175/234]
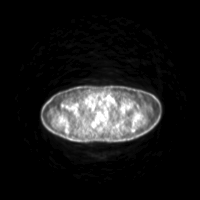
[im 234/234]
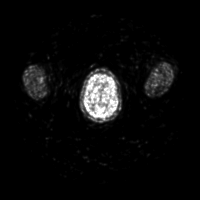

[Series 4: ct sk_thigh 5.0 hd_fov · axial · 5.0mm · 1.03mm/px · z∈[-1380,-448]mm · 5 of 234 slices shown]
[im 1/234]
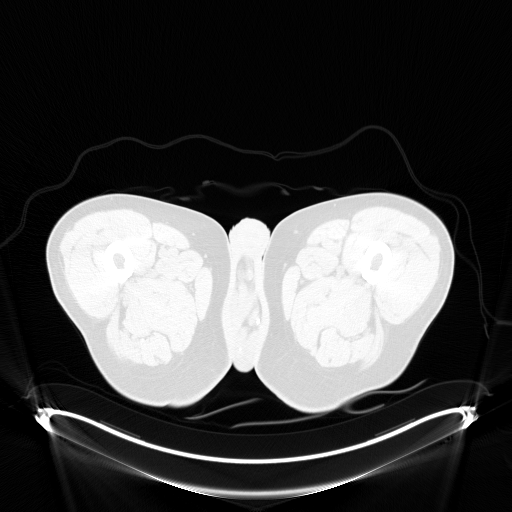
[im 59/234]
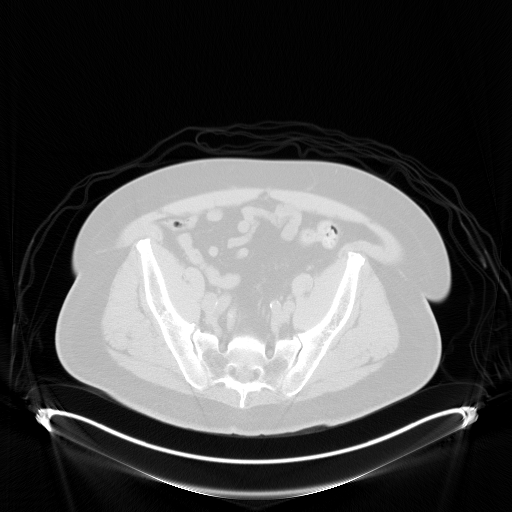
[im 117/234]
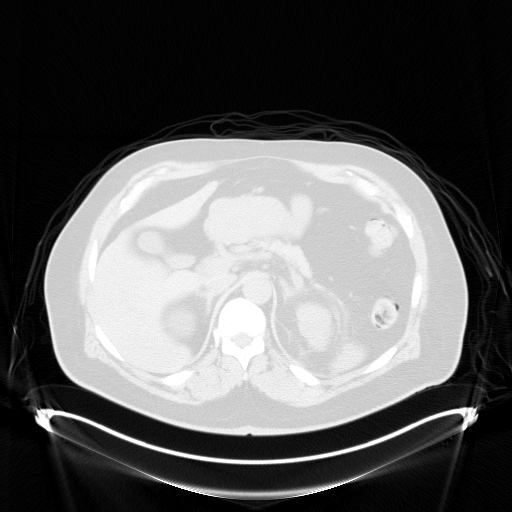
[im 175/234]
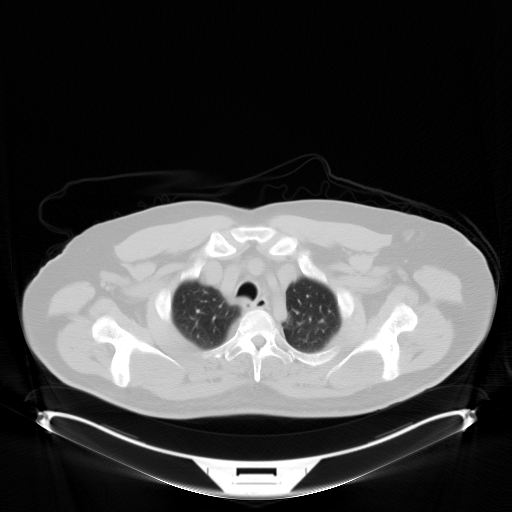
[im 234/234  brain]
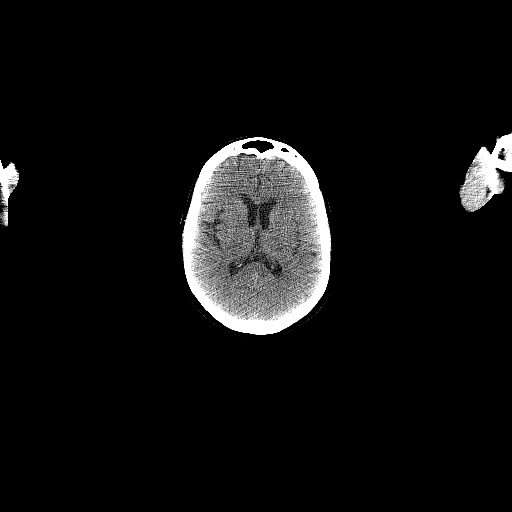

[Series 7: pet sk_thigh nac · axial · 5.0mm · 4.07mm/px · z∈[-1380,-448]mm · 5 of 234 slices shown]
[im 1/234]
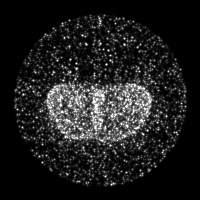
[im 59/234]
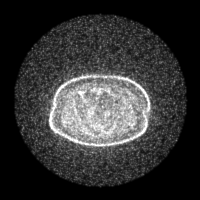
[im 117/234]
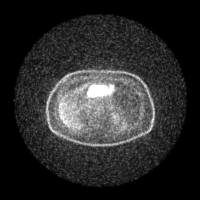
[im 175/234]
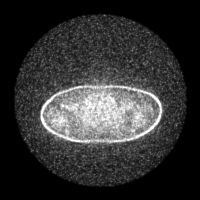
[im 234/234]
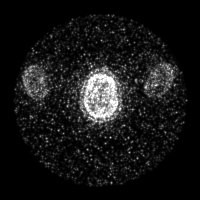

[Series 8: ct sk_thigh 5.0 b70f lung_bone · axial · 5.0mm · 0.68mm/px · 1 of 57 slices shown]
[im 1/57  bone]
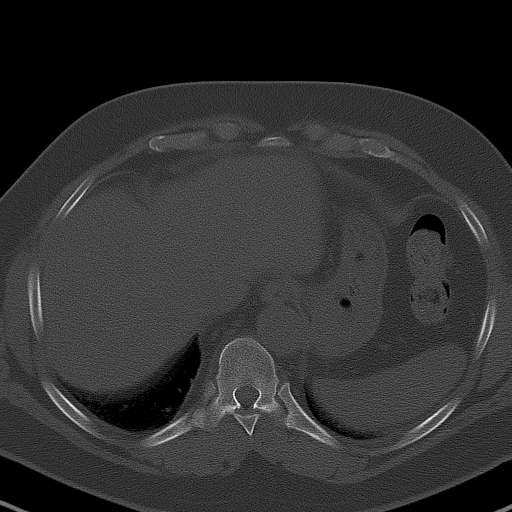

[Series 604: range-ct sk_thigh 5.0 hd_fov-cor-<alpha range> · 2 of 80 slices shown]
[im 1/80]
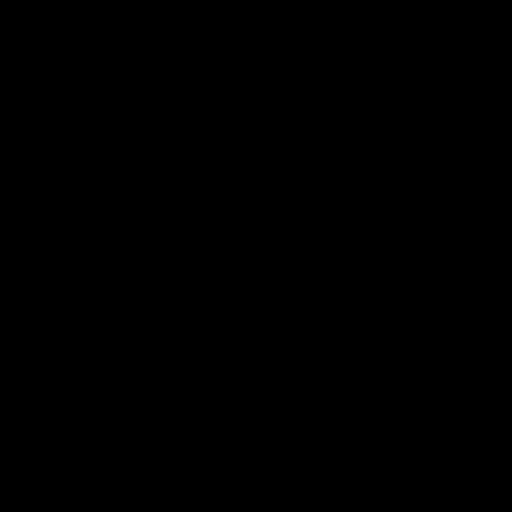
[im 80/80]
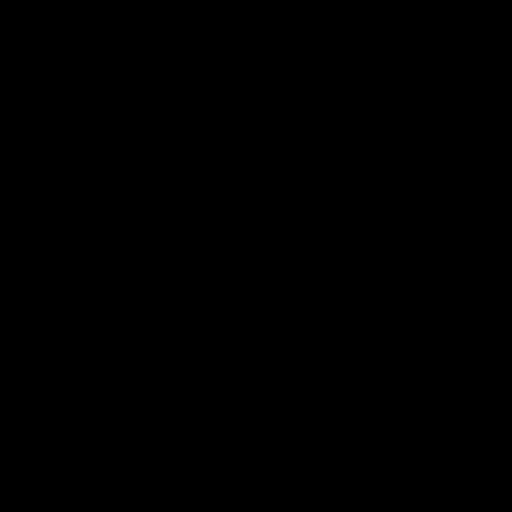

[Series 605: mip collection · coronal · 1.93mm/px · 1 of 32 slices shown]
[im 1/32]
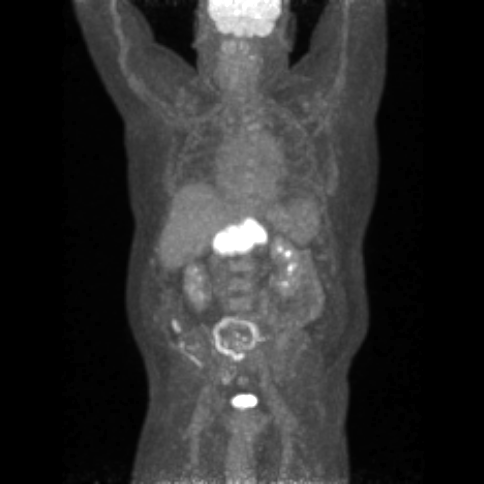

[Series 606: range-ct sk_thigh 5.0 hd_fov-tra-<alpha range> · 5 of 221 slices shown]
[im 1/221]
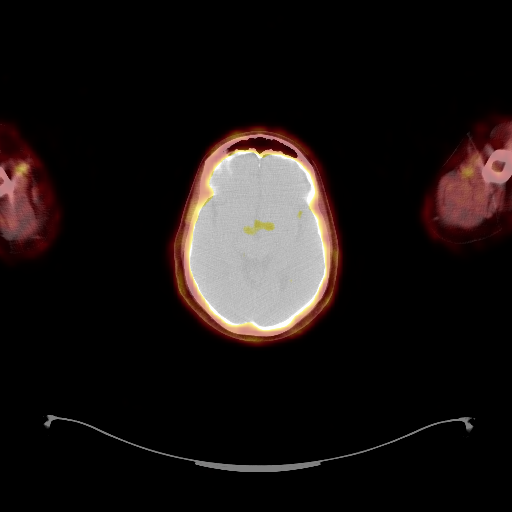
[im 56/221]
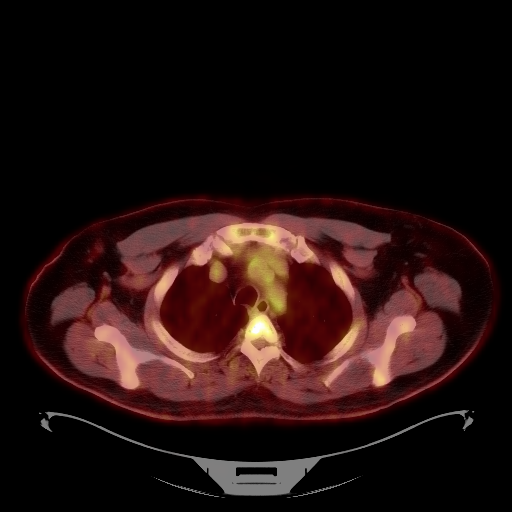
[im 111/221]
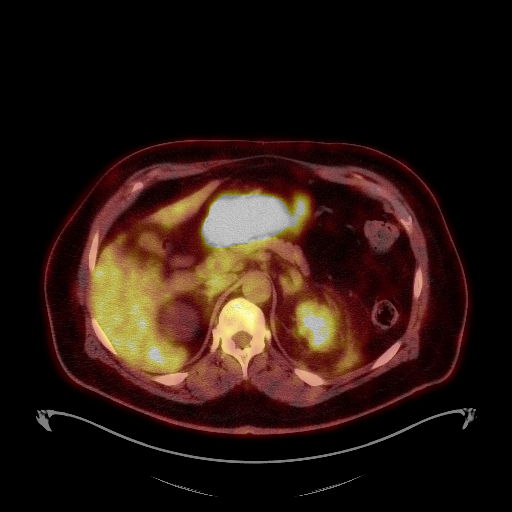
[im 166/221]
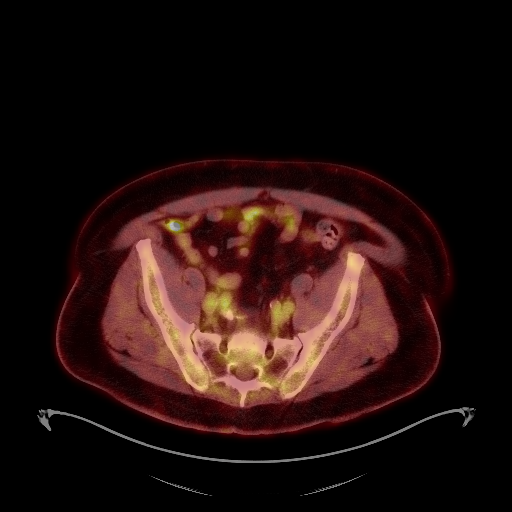
[im 221/221]
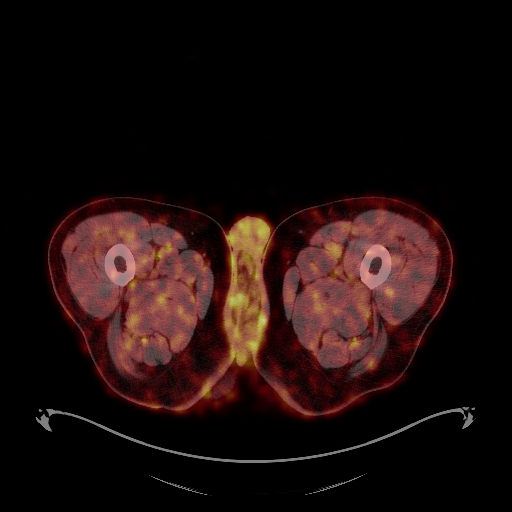

[Series 1032: results mm oncology reading · 0.94mm/px · 1 of 2 slices shown]
[im 1/2]
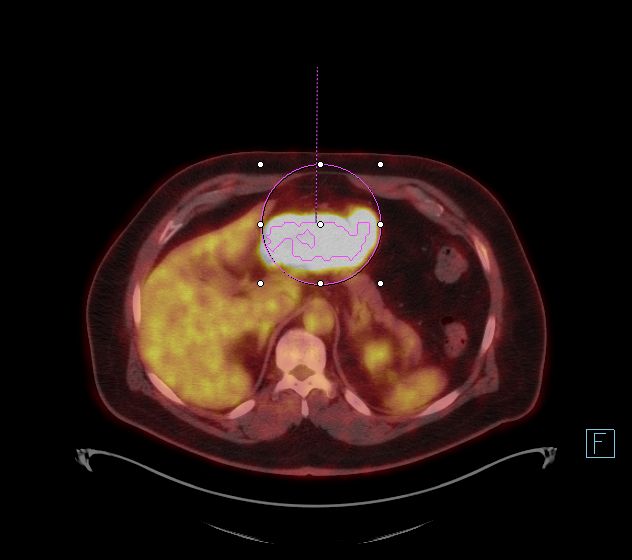

[25 of 25 positions shown; findings below may reference images not displayed]

FINDINGS: NECK

No hypermetabolic lymph nodes in the neck. CT images show no acute
findings.

CHEST

No hypermetabolic mediastinal, hilar or axillary lymph nodes. No
hypermetabolic pulmonary nodules. Coronary artery calcification. No
pericardial or pleural effusion. 1.5 cm soft tissue nodule in the
anteromedial left chest wall (CT image 84) does not show abnormal
metabolism above blood pool.

ABDOMEN/PELVIS

Marked thickening of the gastric antrum is difficult to measure but
has an SUV max of 49.7. 12 mm lymph node adjacent to the proximal
duodenum (CT image 124) has an SUV max of 5.4. No abnormal
hypermetabolism in the liver, adrenal glands, spleen or pancreas. No
additional hypermetabolic lymph nodes. Liver, gallbladder and
adrenal glands are unremarkable. Low-attenuation lesions in the
kidneys measure up to 6.3 cm on the right and are difficult to
further characterize without post-contrast imaging. A 1.8 cm
low-attenuation lesion in the lower pole right kidney has peripheral
calcification. Spleen, pancreas, stomach and bowel are otherwise
grossly unremarkable. Atherosclerotic calcification of the arterial
vasculature. Right common iliac artery measures 2.2 cm and left
common iliac artery measures 2.1 cm. No free fluid. Bilateral
inguinal hernias contain fat. Brachytherapy seeds are seen in the
prostate.

SKELETON

No abnormal osseous hypermetabolism. Degenerative changes are seen
in the spine.
IMPRESSION: 1. Markedly hypermetabolic mass in the gastric antrum with a
hypermetabolic lymph node adjacent to the proximal duodenum, most
indicative of malignancy. No distant metastatic disease.
2. Aortic atherosclerosis ([3T]-170.0). Coronary artery
calcification. Bilateral common iliac artery aneurysms.
3. Soft tissue nodule in the anteromedial left chest wall does not
exhibit malignant range hypermetabolism.

## 2015-11-12 MED ORDER — FLUDEOXYGLUCOSE F - 18 (FDG) INJECTION
12.8600 | Freq: Once | INTRAVENOUS | Status: AC | PRN
  Administered 2015-11-12: 12.86 via INTRAVENOUS

## 2015-11-13 ENCOUNTER — Telehealth: Payer: Self-pay | Admitting: *Deleted

## 2015-11-13 NOTE — Telephone Encounter (Signed)
FYI "Could I get Dr Alen Blew to give me the results of the radiology test.  I'm worrying and would like a call.  I was in the hospital recently for problems with my stomach."

## 2015-11-14 ENCOUNTER — Telehealth: Payer: Self-pay | Admitting: *Deleted

## 2015-11-14 NOTE — Telephone Encounter (Signed)
The results of the scan and GI tumor board discussion was shared with the patient today over the phone.

## 2015-11-14 NOTE — Telephone Encounter (Signed)
Oncology Nurse Navigator Documentation  Oncology Nurse Navigator Flowsheets 11/14/2015  Navigator Location [facility]  Navigator Encounter Type Telephone  Telephone Outgoing Call;Appt Confirmation/Clarification  Abnormal Finding Date 10/27/2015  Confirmed Diagnosis Date 10/27/2015  Spoke with patient and provided new patient appointment for 11/16/15 at 0945 in [facility] [place] to see Dr. Barry Dienes. His case was discussed in tumor board today and his cancer is potentially resectable after neoadjuvant chemotherapy. He will also need a port. He is aware of location of [place] [place] service. Informed him to check in with the front desk and let them know he is here to see the surgeon and navigator will come escort him to exam room.

## 2015-11-16 ENCOUNTER — Encounter: Payer: Self-pay | Admitting: *Deleted

## 2015-11-16 ENCOUNTER — Ambulatory Visit: Payer: Medicare Other | Attending: General Surgery | Admitting: Physical Therapy

## 2015-11-16 ENCOUNTER — Other Ambulatory Visit: Payer: Self-pay | Admitting: General Surgery

## 2015-11-16 DIAGNOSIS — C169 Malignant neoplasm of stomach, unspecified: Secondary | ICD-10-CM

## 2015-11-16 DIAGNOSIS — M6281 Muscle weakness (generalized): Secondary | ICD-10-CM | POA: Insufficient documentation

## 2015-11-16 DIAGNOSIS — R262 Difficulty in walking, not elsewhere classified: Secondary | ICD-10-CM

## 2015-11-16 DIAGNOSIS — R293 Abnormal posture: Secondary | ICD-10-CM

## 2015-11-16 NOTE — Therapy (Addendum)
[facility] [address], [place], [place], [address] Phone: [phone]   Fax:  [phone]  Physical Therapy Evaluation  Patient Details  Name: Jacob [name] MRN: [id number] Date of Birth: 06-04-1945 Referring Provider: Dr. Atha Starks  Encounter Date: 11/16/2015      PT End of Session - 11/16/15 1228    Visit Number 1   Number of Visits 10   Date for PT Re-Evaluation 01/11/16   Authorization Type KX modifier needed at visit 15    PT Start Time 1119   PT Stop Time 1201   PT Time Calculation (min) 42 min   Activity Tolerance Patient tolerated treatment well;No increased pain   Behavior During Therapy WFL for tasks assessed/performed      Past Medical History:  Diagnosis Date  . Anemia    hx iron deficiency  . Anxiety   . Arthritis    gout  . Back pain   . BPH with obstruction/lower urinary tract symptoms   . Depression   . ED (erectile dysfunction)   . Epigastric pain   . Heartburn   . History of radiation therapy 11/03/11-12/29/11   prostate  . Hypercholesterolemia   . Hypertension   . Night sweats   . Prostate cancer ([place]) 08/14/11   Adenocarcinoma,gleason:3+3=6,& 3+4=7,PSA=5.66  . PUD (peptic ulcer disease)   . Ulcer ([place])    peptic ulcer hx    Past Surgical History:  Procedure Laterality Date  . colon polyps bx  11/24/06   colon,transverse and rectosigmoid polyps:tubular adenomas and hyperplastic polyps,no high grade dysplasia or malignancy   . duodenal bx  11/24/06   benign  . ESOPHAGOGASTRODUODENOSCOPY N/A 10/27/2015   Procedure: ESOPHAGOGASTRODUODENOSCOPY (EGD);  Surgeon: [name], MD;  Location: [facility];  Service: Endoscopy;  Laterality: N/A;  . EUS N/A 11/08/2015   Procedure: UPPER ENDOSCOPIC ULTRASOUND (EUS) RADIAL;  Surgeon: [name], MD;  Location: [facility];  Service: Endoscopy;  Laterality: N/A;  . gastric bx  11/24/06   chronic active gastritis,with  metaplasia and focal changaes of xanthelasma  . INSERTION PROSTATE RADIATION SEED  12-29-11  . PROSTATE BIOPSY  08/14/11   Adenocarcinoma/volume=58.51cc,gleason=3+3=6 & 3+4=7  . TONSILLECTOMY     70 years old    There were no vitals filed for this visit.       Subjective Assessment - 11/16/15 1207    Subjective Pt presents with gastric cancer and recent onset of instability.  Wife is in room with patient during evaluation.  Pt states he has not fallen but has noticed being a little less steady lately.  He reports that he does not walk very much unless he is going to the grocery store and then he uses the cart to lean on.  Pt was referred to PT by Dr. Atha Starks for eval and treat of impairments in order to ensure the best outcomes post surgery. Pt was seen by PT for a low complexity eval.   Patient is accompained by: Family member   Limitations Walking   How long can you walk comfortably? No increased pain with walking, but feels less steady   Patient Stated Goals To feel stronger and more steady   Currently in Pain? No/denies            [facility] PT Assessment - 11/16/15 0001      Assessment   Medical Diagnosis gastric cancer   Referring Provider Dr. 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     Cognition   Overall Cognitive Status Within Functional Limits for tasks assessed   Attention Focused     Observation/Other Assessments-Edema    Edema --  distended abdomen     Strength   Overall Strength Within functional limits for tasks performed  5/5 BLE     Transfers   Five time sit to stand comments  22.3 s     Ambulation/Gait   Ambulation/Gait Yes   Ambulation/Gait Assistance 7: Independent    Assistive device None   Gait Pattern Trendelenburg;Lateral trunk lean to right;Lateral trunk lean to left;Decreased trunk rotation   Ambulation Surface Level     Standardized Balance Assessment   Five times sit to stand comments  22.3 sec     Berg Balance Test   Sit to Stand Able to stand without using hands and stabilize independently   Standing Unsupported Able to stand safely 2 minutes   Sitting with Back Unsupported but Feet Supported on Floor or Stool Able to sit safely and securely 2 minutes   Stand to Sit Sits safely with minimal use of hands   Transfers Able to transfer safely, minor use of hands   Standing Unsupported with Eyes Closed Able to stand 10 seconds safely   Standing Ubsupported with Feet Together Able to place feet together independently and stand 1 minute safely   From Standing, Reach Forward with Outstretched Arm Can reach forward >5 cm safely (2")   From Standing Position, Pick up Object from Floor Able to pick up shoe safely and easily   From Standing Position, Turn to Look Behind Over each Shoulder Turn sideways only but maintains balance   Turn 360 Degrees Able to turn 360 degrees safely but slowly   Standing Unsupported, Alternately Place Feet on Step/Stool Able to stand independently and complete 8 steps >20 seconds   Standing Unsupported, One Foot in Front Able to plae foot ahead of the other independently and hold 30 seconds   Standing on One Leg Able to lift leg independently and hold equal to or more than 3 seconds   Total Score 46   Berg comment: Pt has significant to high fall risk                           PT Education - 11/16/15 1226    Education provided Yes   Education Details reviewed initial HEP and energy conservation techiques handouts   Person(s) Educated Patient;Spouse   Methods Explanation;Demonstration;Verbal cues;Handout   Comprehension Verbalized understanding;Returned demonstration          PT Short Term Goals -  11/16/15 1237      PT SHORT TERM GOAL #1   Title pt will be independent with initial HEP   Time 2   Period Weeks   Status New     PT SHORT TERM GOAL #2   Title Pt will demonstrate improved gait with increased stride length, decreased Trendelenburg, and able to demonstrate some arm swing.   Time 4   Period Weeks   Status New           PT Long Term Goals -  11/16/15 1239      PT LONG TERM GOAL #1   Title Pt will be independent with final HEP   Time 8   Period Weeks   Status New     PT LONG TERM GOAL #2   Title Pt will improve Berg balance score to >50   Time 8   Period Weeks   Status New     PT LONG TERM GOAL #3   Title Patient will increase postural strength and endurance in order to be able to walk around grocery store without leaning onto the cart.   Time 8   Period Weeks   Status New     PT LONG TERM GOAL #4   Title Pt will report >50% improvement of his stability   Time 8   Period Weeks   Status New               Plan - 11/16/15 1230    Clinical Impression Statement Pt presents with a moderate/high fall risk according to berg balance screening.  Pt also demonstrates some weakness apparent in gait abnormalities which show him to have some hip tightness and weakness in hip and core.  Pt demonstrates rounding forwards of the shoulders.  Pt also reports lack of endurance as he needs to lean more on the grocery cart as he does the grocery shopping.  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  PT will address increased strength and endurance as well as ways to improve efficiency of gait and posture and other energy conservation techniques.   Rehab Potential Good   Clinical Impairments Affecting Rehab Potential cancer treatments (chemo), ability to make it to PT appointments   PT Frequency 2x /  week   PT Duration 8 weeks   PT Treatment/Interventions ADLs/Self Care Home Management;Moist Heat;Therapeutic activities;Therapeutic exercise;Gait training;Balance training;Neuromuscular re-education;Patient/family education;Manual techniques   PT Next Visit Plan review HEP, add glute med strengthening and progress balance as tolerated   PT Home Exercise Plan single leg standing, tandem standing   Consulted and Agree with Plan of Care Patient;Family member/caregiver   Family Member Consulted wife      Patient will benefit from skilled therapeutic intervention in order to improve the following deficits and impairments:  Abnormal gait, Difficulty walking, Decreased balance, Decreased mobility, Decreased strength, Decreased activity tolerance  Visit Diagnosis: Muscle weakness (generalized) - Plan: PT plan of care cert/re-cert  Difficulty in walking, not elsewhere classified - Plan: PT plan of care cert/re-cert  Abnormal posture - Plan: PT plan of care cert/re-cert      G-Codes - 68/12/75 1247    Functional Limitation Mobility: Walking and moving around   Mobility: Walking and Moving Around Current Status [phone]) At least 20 percent but less than 40 percent impaired, limited or restricted   Mobility: Walking and Moving Around Goal Status [phone]) At least 1 percent but less than 20 percent impaired, limited or restricted       Problem List Patient Active Problem List   Diagnosis Date Noted  . Gastric adenocarcinoma ([place])   . Gastric mass   . GI bleed 10/26/2015  . Gastrointestinal hemorrhage   . PUD (peptic ulcer disease)   . 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 Nocturnal hypoxemia 11/04/2013  . Noncompliance with treatment 09/02/2013  . Prostate  cancer ([place]) 08/14/2011  . Iron deficiency anemia 03/19/2007  . DEPRESSION 03/19/2007  . Essential hypertension 03/19/2007  . NEOPLASM, BENIGN, STOMACH 11/23/2006  . POLYP, COLON 11/23/2006  . INTERNAL HEMORRHOIDS  11/23/2006  . GASTRITIS 11/23/2006    Zannie Cove, PT 11/16/2015, 1:03 PM  [facility] [address], [place], [place], [address] Phone: [phone]   Fax:  [phone]  Name: Aiven Kampe MRN: [id number] Date of Birth: June 13, 1945  PHYSICAL THERAPY DISCHARGE SUMMARY  Visits from Start of Care: 1  Current functional level related to goals / functional outcomes: See above.   Remaining deficits: See above.    Education / Equipment: HEP Plan:                                                    Patient goals were not met. Patient is being discharged due to not returning since the last visit.  Thank you for the referral. Earlie Counts, PT 03/17/17 2:30 PM  ?????

## 2015-11-16 NOTE — Patient Instructions (Signed)
Care Plan Summary- 11/16/2015 Name: Jacob [name]    DOB:  1945-03-09 Your Medical Team: Medical Oncologist:  Dr. Zola Button Radiation Oncologist:   Surgeon:   Dr. [name] Type of Cancer: Gastric Cancer-Pyloric antrum Stage/Grade:  Undetermined *Exact staging of your cancer is based on size of the tumor, depth of invasion, involvement of lymph nodes or not, and whether or not the cancer has spread beyond the primary site   Recommendations: Based on information available as of today's consult. Recommendations may change depending on the results of further tests or exams. 1) Neoadjuvant chemotherapy, then. 2) Surgery  3) Referral to dietician to improve nutrition Next Steps: 1) See Dr. Alen Blew as scheduled 2) Dr. Marlowe Aschoff office will call you with the port appointment (11/14 or 11/15) 3) Work on high protein/high calorie diet Questions? Merceda Elks, RN, BSN at [phone]. Manuela Schwartz is your Oncology Nurse Navigator and is available to assist you while you're receiving your medical care at [facility].

## 2015-11-16 NOTE — Progress Notes (Signed)
[facility] Psychosocial Distress Screening Clinical Social Work  Clinical Social Work met with pt and his wife at [facility] to introduce self, explain role of CSW and to review the distress screening protocol.  The patient scored a 2 on the Psychosocial Distress Thermometer which indicates no distress. Clinical Social Worker reviewed distress screen to assess for distress and other psychosocial needs. Both pt and wife have vision challenges and do not drive. 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 CSW inquired if they needed larger type for info sheets and pt declined. They report to have 3 sons and 1 daughter that come to their home to assist and help with transportation. Pt reports to "not walk real good" and was going to follow up with PT today. Wife was asking about help in the home after pt's surgery as he is her caregiver. After further exploration, family can assist. However, pt may need additional support or higher level of care based on PT eval at that time due to limited support in home.   CSW explored coping with pt and he reports he has struggled with depression for many years. He has seen psychiatrist in the past and reports his Wellbutrin works well for him. Pt reports he is quiet and does not have lots of friends. He often watches TV for fun and does not appear to engage in other activities to aide in his coping. CSW reviewed common emotions, coping techniques and support programs to assist.  Pt and wife appreciated support and were made aware their regular CSW is Polo Riley. They plan to reach out as needed.   ONCBCN DISTRESS SCREENING 11/16/2015  Screening Type Initial Screening  Distress experienced in past week (1-10) 2  Practical problem type Food  Emotional problem type Depression;Nervousness/Anxiety;Adjusting to illness;Boredom;Adjusting to appearance changes  Spiritual/Religous concerns type Facing my mortality  Information Concerns Type  Lack of info about treatment;Lack of info about maintaining fitness  Physical Problem type Getting around;Constipation/diarrhea;Loss of appetitie  Physician notified of physical symptoms Yes  Referral to clinical social work Yes  Referral to dietition Yes  Referral to support programs Yes    Clinical Social Worker follow up needed: No.  If yes, follow up plan:  Loren Racer, [place]  [facility] Phone: [phone] Fax: [phone]

## 2015-11-16 NOTE — H&P (Signed)
Jacob [name] 11/16/2015 11:00 AM Location: [facility] Patient #: [id number] DOB: 02/27/1945 Undefined / Language: [name] / Race: Black or African American Male   History of Present Illness [name] MD; 11/16/2015 11:29 AM) The patient is a 70 year old male who presents with gastric cancer. This is a 70 year old male referred by Dr. Alen Blew with a new diagnosis of gastric cancer. He has been being followed for prostate cancer. That was diagnosed in 2013. He received radiation treatment as definitive therapy for that. He was seen to be anemic and early 2017 and had a bone marrow biopsy. 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 He got iron infusion in April and September. He was hospitalized on October 14 after presenting to the emergency department with melena. EGD was performed which showed a partially circumferential large antral mass. Biopsies were positive for adenocarcinoma. Endoscopic ultrasound was also performed which did not show any evidence of linitis plastica. It was felt to be an uT3N2 cancer.  The patient has noticed a decrease in his appetite over the last 3 months. He has lost somewhere between 15 and 20 pounds during that time period. He intermittently will have some diarrhea. He has been very strong for many years and has not had to have any significant medical involvement other than his prostate cancer until this year. He has started feeling weak and shaky over the past few weeks.  Other than his personal history of prostate cancer, the only cancer history he is aware of in his family is some type of stomach cancer in his mother. He does not know that it was in the organ of the stomach, but does know is was something in her abdomen.   Pathology Diagnosis Stomach, biopsy, antrum mass - ADENOCARCINOMA  EGD A large, fungating and ulcerated, partially circumferential (involving one-half of the lumen circumference) mass with no bleeding and  stigmata of recent bleeding was found in the gastric antrum and on the posterior wall of the gastric antrum. Biopsies were taken with a cold forceps for histology. Verification of patient identification for the specimen was done. Estimated blood loss was minimal.  EUS 1. The distal gastric mass above correlates with a hypoechoic and heterogenous lesion which measured 25 mm in thickness. The endosonographic borders were poorly-defined. There was sonographic evidence suggesting invasion into the serosa (Layer 5, uT3). 2. There were three small (3-40m) but suspicious perigastric lymphnodes near the mass. These are presumed to be positive for malignant involvement. (uN2) 3. No ascites. 4. Limited view of left lobe of liver, pancreas, spleen were normal (uMX) - uT3N2MX non-circumerfential, distal gastric adenocarcinoma which extends into the proximal duodenal bulb. The mass is 6-7cm in cranial caudal length and 2.5cm in maximal thickness. - EUS is not optimal for assessing malignant involvement of perigastric adenopathy, but the nodes that I saw are suspicious for malignant involvement.  PET IMPRESSION: 1. Markedly hypermetabolic mass in the gastric antrum with a hypermetabolic lymph node adjacent to the proximal duodenum, most indicative of malignancy. No distant metastatic disease. 2. Aortic atherosclerosis (ICD10-170.0). Coronary artery calcification. Bilateral common iliac artery aneurysms. 3. Soft tissue nodule in the anteromedial left chest wall does not exhibit malignant range hypermetabolism  CT C/A/P IMPRESSION: Circumferential wall thickening of the gastric antrum which is nonspecific and may be secondary to an infectious/inflammatory process or gastric malignancy/mass. Recommend correlation with upper endoscopy.  Nonspecific mass within the subcutaneous fat overlying the left chest wall. Recommend dedicated evaluation physical exam and ultrasound as  clinically  indicated.  Aortic atherosclerosis.  LABS:  BMET Cr 1.92, GFR 39 CBC HCT 27.3 CEA 100.9  Medications Allopurinol Wellbutrin XL Diazepam Lasix Gabapentin Xalatan eye drops Nebivolol prilosec flomax desyrel verapamil    Other Problems (Jericho Cieslik, MD; 11/16/2015 11:27 AM) Anxiety Disorder Back Pain Depression Enlarged Prostate Gastroesophageal Reflux Disease High blood pressure Home Oxygen Use Hypercholesterolemia Sleep Apnea  Past Surgical History (Karyl Sharrar, MD; 11/16/2015 11:27 AM) Tonsillectomy  Diagnostic Studies History (Stellar Gensel, MD; 11/16/2015 11:27 AM) Colonoscopy 1-5 years ago  Social History (Treshon Stannard, MD; 11/16/2015 11:27 AM) Alcohol use Moderate alcohol use. Caffeine use Carbonated beverages. Illicit drug use Remotely quit drug use. Tobacco use Former smoker.  Family History (Alexy Heldt, MD; 11/16/2015 11:27 AM) Alcohol Abuse Father. Hypertension Brother, Sister. Malignant Neoplasm Of Pancreas Mother.    Review of Systems (Judas Mohammad MD; 11/16/2015 11:27 AM) General Present- Night Sweats and Weight Loss. Not Present- Appetite Loss, Chills, Fatigue, Fever and Weight Gain. Skin Not Present- Change in Wart/Mole, Dryness, Hives, Jaundice, New Lesions, Non-Healing Wounds, Rash and Ulcer. HEENT Present- Wears glasses/contact lenses. Not Present- Earache, Hearing Loss, Hoarseness, Nose Bleed, Oral Ulcers, Ringing in the Ears, Seasonal Allergies, Sinus Pain, Sore Throat, Visual Disturbances and Yellow Eyes. Breast Not Present- Breast Mass, Breast Pain, Nipple Discharge and Skin Changes. Cardiovascular Not Present- Chest Pain, Difficulty Breathing Lying Down, Leg Cramps, Palpitations, Rapid Heart Rate, Shortness of Breath and Swelling of Extremities. Gastrointestinal Present- Chronic diarrhea and Indigestion. Not Present- Abdominal Pain, Bloating, Bloody Stool, Change in Bowel Habits, Constipation, Difficulty Swallowing,  Excessive gas, Gets full quickly at meals, Hemorrhoids, Nausea, Rectal Pain and Vomiting. Male Genitourinary Not Present- Blood in Urine, Change in Urinary Stream, Frequency, Impotence, Nocturia, Painful Urination, Urgency and Urine Leakage. Musculoskeletal Present- Back Pain. Not Present- Joint Pain, Joint Stiffness, Muscle Pain, Muscle Weakness and Swelling of Extremities. Neurological Present- Weakness. Not Present- Decreased Memory, Fainting, Headaches, Numbness, Seizures, Tingling, Tremor and Trouble walking. Psychiatric Present- Depression. Not Present- Anxiety, Bipolar, Change in Sleep Pattern, Fearful and Frequent crying. Endocrine Present- Excessive Hunger. Not Present- Cold Intolerance, Hair Changes, Heat Intolerance, Hot flashes and New Diabetes. Hematology Not Present- Blood Thinners, Easy Bruising, Excessive bleeding, Gland problems, HIV and Persistent Infections.  Vitals (Araminta Zorn MD; 11/16/2015 11:08 AM) 11/16/2015 11:07 AM Weight: 264.2 lb Temp.: 99.02F  Pulse: 70 (Regular)  P.OX: 96% (Room air) BP: 118/63 (Sitting, Left Arm, Standard)       Physical Exam (Markanthony Gedney MD; 11/16/2015 11:30 AM) General Mental Status-Alert. General Appearance-Consistent with stated age. Hydration-Well hydrated. Voice-Normal.  Head and Neck Head-normocephalic, atraumatic with no lesions or palpable masses. Trachea-midline. Thyroid Gland Characteristics - normal size and consistency.  Eye Eyeball - Bilateral-Extraocular movements intact. Sclera/Conjunctiva - Bilateral-No scleral icterus.  Chest and Lung Exam Chest and lung exam reveals -quiet, even and easy respiratory effort with no use of accessory muscles and on auscultation, normal breath sounds, no adventitious sounds and normal vocal resonance. Inspection Chest Wall - Normal. Back - normal.  Cardiovascular Cardiovascular examination reveals -normal heart sounds, regular rate and rhythm with no  murmurs and normal pedal pulses bilaterally.  Abdomen Inspection Inspection of the abdomen reveals - Note: small umbilical hernia. Palpation/Percussion Palpation and Percussion of the abdomen reveal - Soft, Non Tender, No Rebound tenderness, No Rigidity (guarding) and No hepatosplenomegaly. Auscultation Auscultation of the abdomen reveals - Bowel sounds normal.  Neurologic Neurologic evaluation reveals -alert and oriented x 3 with no impairment of recent or remote memory. Mental Status-Normal.    Musculoskeletal Global Assessment -Note: no gross deformities.  Normal Exam - Left-Upper Extremity Strength Normal and Lower Extremity Strength Normal. Normal Exam - Right-Upper Extremity Strength Normal and Lower Extremity Strength Normal.  Lymphatic Head & Neck  General Head & Neck Lymphatics: Bilateral - Description - Normal. Axillary  General Axillary Region: Bilateral - Description - Normal. Tenderness - Non Tender. Femoral & Inguinal  Generalized Femoral & Inguinal Lymphatics: Bilateral - Description - No Generalized lymphadenopathy.    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 Because of the size of tumor in the presence of the small lymph nodes, I recommend neoadjuvant chemotherapy. We discussed him at our tumor board and he is going to receive FOLFOX. He has a borderline performance status and we are going to send him to physical therapy and nutrition to support his ability to receive chemotherapy. I will place a Port-A-Cath. Following chemotherapy, our plan would be to do diagnostic laparoscopy and probable distal gastrectomy. He will also receive restaging studies after chemotherapy. He has been strong for most of his life, I think the recent anemia and weight loss are the primary source of his decreased performance status. I suspect  that we can improve this by preoperative treatment. He will get a nutritional consult next week when he comes back to see Dr. Shadad. He is seeing physical therapy today. Patient is in agreement with the plan. His wife is present at this visit as well.  I reviewed the risks of surgery including bleeding, infection, pneumothorax, port malfunction, blood clot, possible need for additional procedures or surgeries.  60 min spent in evaluation, examination, counseling, and coordination of care. >50% spent in counseling. Current Plans Pt Education - ccs port insertion education Pt Education - Stomach Cancer (Gastric Cancer): cancer Referred to Genetic Counseling, for evaluation and follow up (Medical Genetics). Routine. 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 HISTORY OF PROSTATE CANCER (Z85.46) Impression: referral to genetic counseling with two primary cancers. CHRONIC KIDNEY DISEASE (N18.9) HYPERTENSION, ESSENTIAL (I10) Impression: appears well controlled.    Signed by Markise Haymer, MD (11/16/2015 11:38 AM)  

## 2015-11-16 NOTE — Patient Instructions (Signed)
Tandem Stance    Can hold onto counter if not feeling steady. Right foot in front of left, heel touching toe both feet "straight ahead". Stand on Foot Triangle of Support with both feet. Balance in this position _30__ seconds. Do with left foot in front of right. 3 times each  Copyright  VHI. All rights reserved.    Stance: single leg on floor. Raise leg. Hold _15__ seconds. Repeat with other leg. __3_ reps per set, _1__ sets per day .  Can hold onto counter to keep steady  Copyright  VHI. All rights reserved.    Tips for Energy Conservation for Activities of Daily Living . Plan ahead to avoid rushing. . Sit down to bathe and dry off. Wear a terry robe instead of drying off. . Use a shower/bath organizer to decrease leaning and reaching. . Use extension handles on sponges and brushes. Susa Simmonds grab rails in the bathroom or use an elevated toilet seat. Hoyle Barr out clothes and toiletries before dressing. . Minimize leaning over to put on clothes and shoes. Bring your foot to your knee to apply socks and shoes. . Wear comfortable shoes and low-heeled, slip on shoes. Wear button front shirts rather than pullovers. Housekeeping . Schedule household tasks throughout the week. . Do housework sitting down when possible. . Delegate heavy housework, shopping, laundry and child care when possible. . Drag or slide objects rather than lifting. . Sit when ironing and take rest periods. . Stop working before becoming overly tired. Shopping . Organize list by aisle. . Use a grocery cart for support. Marland Kitchen Shop at less busy times. . Ask for help with getting to the car. Meal Preparation . Use convenience and easy-to-prepare foods. . Use small appliances that take less effort to use. Marland Kitchen Prepare meals sitting down. . Soak dishes instead of scrubbing and let dishes air dry. . Prepare double portions and freeze half. Child Care . Plan activities that can be done sitting down, such as drawing  pictures, playing games, reading, and computer games. . Encourage children to climb up onto your lap or into the highchair instead of being lifted. . Make a game of the household chores so that children will want to help. . Delegate child care when possible.  [facility]; [address] [place], [place] [address]

## 2015-11-16 NOTE — Progress Notes (Signed)
Oncology Nurse Navigator Documentation  Oncology Nurse Navigator Flowsheets 11/16/2015  Navigator Location [facility]  Navigator Encounter Type Clinic/[facility]C  Telephone -  Abnormal Finding Date -  Confirmed Diagnosis Date -  Multidisiplinary Clinic Date 11/16/2015  Patient Visit Type Surgery  Treatment Phase Pre-Tx/Tx Discussion  Barriers/Navigation Needs Education  Education Newly Diagnosed Cancer Education;Preparing for Upcoming Surgery/ Treatment  Interventions Coordination of Care;Education;Referrals  Referrals Nutrition/dietician--LOS sent to scheduler for asap referral  Education Method Verbal;Written  Support Groups/Services GI Support Group;[facility];Other--[facility] Support services  Acuity Level 2  Time Spent with Patient 83  Met with patient and wife during new patient visit. Explained the role of the GI Nurse Navigator and provided New Patient Packet with information on: 1. Gastric cancer--anatomy of stomach, PAC information 2. Support groups 3. Advanced Directives 4. Fall Safety Plan Answered questions, reviewed current treatment plan using TEACH back and provided emotional support. Provided copy of current treatment plan. He will see Dr. Alen Blew next week. Dr. Barry Dienes will be placing a port for him on 11/14 or 11/15.   Merceda Elks, RN, BSN GI Oncology [place]

## 2015-11-21 ENCOUNTER — Telehealth: Payer: Self-pay | Admitting: Genetic Counselor

## 2015-11-21 NOTE — Telephone Encounter (Signed)
Added genetics to his appointment schedule, lt vm for pt to call and confirm appt.

## 2015-11-22 ENCOUNTER — Telehealth: Payer: Self-pay | Admitting: Oncology

## 2015-11-22 ENCOUNTER — Telehealth: Payer: Self-pay | Admitting: *Deleted

## 2015-11-22 ENCOUNTER — Other Ambulatory Visit (HOSPITAL_BASED_OUTPATIENT_CLINIC_OR_DEPARTMENT_OTHER): Payer: Medicare Other

## 2015-11-22 ENCOUNTER — Ambulatory Visit (HOSPITAL_BASED_OUTPATIENT_CLINIC_OR_DEPARTMENT_OTHER): Payer: Medicare Other | Admitting: Oncology

## 2015-11-22 ENCOUNTER — Ambulatory Visit (HOSPITAL_COMMUNITY)
Admission: RE | Admit: 2015-11-22 | Discharge: 2015-11-22 | Disposition: A | Discharge status: Home / Self Care | Payer: Medicare Other | Source: Ambulatory Visit | Attending: Oncology | Admitting: Oncology

## 2015-11-22 ENCOUNTER — Ambulatory Visit: Payer: Medicare Other | Admitting: Nurse Practitioner

## 2015-11-22 ENCOUNTER — Other Ambulatory Visit: Payer: Self-pay | Admitting: *Deleted

## 2015-11-22 VITALS — BP 125/61 | HR 74 | Temp 97.8°F | Resp 18 | Ht 74.0 in | Wt 260.9 lb

## 2015-11-22 DIAGNOSIS — D509 Iron deficiency anemia, unspecified: Secondary | ICD-10-CM

## 2015-11-22 DIAGNOSIS — C169 Malignant neoplasm of stomach, unspecified: Secondary | ICD-10-CM | POA: Diagnosis not present

## 2015-11-22 DIAGNOSIS — D649 Anemia, unspecified: Secondary | ICD-10-CM

## 2015-11-22 DIAGNOSIS — N189 Chronic kidney disease, unspecified: Secondary | ICD-10-CM

## 2015-11-22 DIAGNOSIS — Z8546 Personal history of malignant neoplasm of prostate: Secondary | ICD-10-CM

## 2015-11-22 DIAGNOSIS — D631 Anemia in chronic kidney disease: Secondary | ICD-10-CM | POA: Diagnosis not present

## 2015-11-22 LAB — CBC WITH DIFFERENTIAL/PLATELET
BASO%: 0.2 % (ref 0.0–2.0)
Basophils Absolute: 0 10*3/uL (ref 0.0–0.1)
EOS%: 2.8 % (ref 0.0–7.0)
Eosinophils Absolute: 0.2 10*3/uL (ref 0.0–0.5)
HEMATOCRIT: 23.1 % — AB (ref 38.4–49.9)
HGB: 7.3 g/dL — ABNORMAL LOW (ref 13.0–17.1)
LYMPH#: 0.7 10*3/uL — AB (ref 0.9–3.3)
LYMPH%: 10.6 % — ABNORMAL LOW (ref 14.0–49.0)
MCH: 29.7 pg (ref 27.2–33.4)
MCHC: 31.6 g/dL — AB (ref 32.0–36.0)
MCV: 93.9 fL (ref 79.3–98.0)
MONO#: 0.6 10*3/uL (ref 0.1–0.9)
MONO%: 9.1 % (ref 0.0–14.0)
NEUT%: 77.3 % — AB (ref 39.0–75.0)
NEUTROS ABS: 5 10*3/uL (ref 1.5–6.5)
Platelets: 192 10*3/uL (ref 140–400)
RBC: 2.46 10*6/uL — ABNORMAL LOW (ref 4.20–5.82)
RDW: 18 % — AB (ref 11.0–14.6)
WBC: 6.5 10*3/uL (ref 4.0–10.3)

## 2015-11-22 LAB — PREPARE RBC (CROSSMATCH)

## 2015-11-22 MED ORDER — LIDOCAINE-PRILOCAINE 2.5-2.5 % EX CREA: 1.0000 "application " | TOPICAL_CREAM | CUTANEOUS | 0 refills | Status: DC | PRN

## 2015-11-22 MED ORDER — PROCHLORPERAZINE MALEATE 10 MG PO TABS: 10.0000 mg | ORAL_TABLET | Freq: Four times a day (QID) | ORAL | 0 refills | Status: DC | PRN

## 2015-11-22 NOTE — Telephone Encounter (Signed)
Per Dr. Alen Blew, patient needs two units of blood for a hemoglobin of 7.3. This nurse has arranged for him to go to [facility] tomorrow, 11/23/15 at 9:00 am for the transfusion. This message was left on his home answering machine and cell phone. Instructed patient to call [phone] and let us know that he got this message.

## 2015-11-22 NOTE — Progress Notes (Signed)
Hematology and Oncology Follow Up Visit  Jacob [name] [id number] [birth year] 70 y.o. 11/22/2015 12:29 PM Jacob Cedar, MD   Principle Diagnosis:   70 year old gentleman with: 1.Normocytic, normochromic anemia diagnosed March 2017. His anemia is due to renal insufficiency with creatinine of 2.9 creatinine clearance 44 mL/m.  2. Prostate cancer in 2013 with a Gleason score 3+4 = 7 and a PSA 5.66 stage TIc.  3. 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 His staging by EUS showed T3 N2 disease without any evidence of metastatic disease.   Prior Therapy:  He is status post radiation therapy for definitive treatment for his prostate cancer. Therapy concluded in December 2013. He is status post packed red cell transfusions in April 2017. He is status post bone marrow biopsy in April 2017. Results did not show any plasma cell disorder or myelodysplasia. He is status post IV iron infusion completed in April 2017. This was repeated in September 2017.   Current therapy:  Aranesp 300 g injection every 2 weeks to get his hemoglobin to 10. He also receives intermittent packed red cell transfusions. Under consideration to start neoadjuvant chemotherapy before consideration for surgical resection.  Interim History: [name] presents today for a follow-up visit. Since the last visit, he underwent EUS to complete his staging also underwent a PET CT scan. He reports feeling reasonably well without any recent complaints. He does not report any abdominal pain, dyspepsia, hematochezia or melena. He denied any excessive fatigue or tiredness. Although he is continuing to lose weight.   He does not report any headaches, blurry vision, syncope or seizures. He does not report any fevers, chills, sweats or weight loss. Does not report any chest pain, palpitation, orthopnea or leg edema. He does not report any cough, wheezing or hemoptysis. He does not  report any frequency, urgency or hesitancy. He does not report any skeletal complaints of arthralgias or myalgias. Remaining review of systems unremarkable.   Medications: I have reviewed the patient's current medications.  Current Outpatient Prescriptions  Medication Sig Dispense Refill  . acetaminophen (TYLENOL) 500 MG tablet Take 1,000 mg by mouth daily as needed for moderate pain or headache.    . allopurinol (ZYLOPRIM) 300 MG tablet Take 300 mg by mouth daily.     Marland Kitchen buPROPion (WELLBUTRIN XL) 300 MG 24 hr tablet Take 300 mg by mouth daily.     . calcium carbonate (TUMS - DOSED IN MG ELEMENTAL CALCIUM) 500 MG chewable tablet Chew 3 tablets by mouth 2 (two) times daily as needed for indigestion or heartburn.    . diazepam (VALIUM) 5 MG tablet Take 5 mg by mouth at bedtime as needed (sleep).     . feeding supplement, GLUCERNA SHAKE, (GLUCERNA SHAKE) LIQD Take 237 mLs by mouth daily.     . furosemide (LASIX) 80 MG tablet Take 40 mg by mouth daily.     Marland Kitchen gabapentin (NEURONTIN) 800 MG tablet Take 400 mg by mouth at bedtime.     . influenza vac recom quadrivalent (FLUBLOK) 0.5 ML injection Inject 0.5 mLs into the muscle once.    . latanoprost (XALATAN) 0.005 % ophthalmic solution Place 1 drop into both eyes at bedtime.    . Melatonin 10 MG TABS Take 1 tablet by mouth at bedtime as needed (sleep).     . Nebivolol HCl (BYSTOLIC) 20 MG TABS Take 20 mg by mouth daily.     Marland Kitchen omeprazole (PRILOSEC) 20 MG capsule Take 20 mg by mouth daily.    Marland Kitchen  OXYGEN Inhale 2 L into the lungs at bedtime as needed (for shortness of breath).    . tamsulosin (FLOMAX) 0.4 MG CAPS capsule TAKE 1 CAPSULE BY MOUTH DAILY 30 capsule 2  . traZODone (DESYREL) 50 MG tablet Take 50 mg by mouth at bedtime.     . Verapamil HCl CR 300 MG CP24 Take 300 mg by mouth at bedtime.     . lidocaine-prilocaine (EMLA) cream Apply 1 application topically as needed. Apply to port before chemotherapy. 30 g 0  . prochlorperazine (COMPAZINE) 10 MG  tablet Take 1 tablet (10 mg total) by mouth every 6 (six) hours as needed for nausea or vomiting. 30 tablet 0   No current facility-administered medications for this visit.      Allergies: No Known Allergies  Past Medical History, Surgical history, Social history, and Family History were reviewed and updated.   Physical Exam: Blood pressure 125/61, pulse 74, temperature 97.8 F (36.6 C), temperature source Oral, resp. rate 18, height _0  (1.88 m), weight 260 lb 14.4 oz (118.3 kg), SpO2 99 %. ECOG: 1 General appearance: Well-appearing gentleman without distress. Head: Normocephalic, without obvious abnormality no oral thrush noted. Neck: no adenopathy Lymph nodes: Cervical, supraclavicular, and axillary nodes normal. Heart:regular rate and rhythm, S1, S2 normal, no murmur, click, rub or gallop Lung:chest clear, no wheezing, rales, normal symmetric air entry Abdomin: soft, non-tender, without masses or organomegaly no rebound or guarding. EXT:no erythema, induration, or nodules   Lab Results: Lab Results  Component Value Date   WBC 6.5 11/22/2015   HGB 7.3 (L) 11/22/2015   HCT 23.1 (L) 11/22/2015   MCV 93.9 11/22/2015   PLT 192 11/22/2015     Chemistry      Component Value Date/Time   NA 138 10/29/2015 0457   NA 139 09/27/2015 0834   K 4.6 10/29/2015 0457   K 4.7 09/27/2015 0834   CL 109 10/29/2015 0457   CO2 24 10/29/2015 0457   CO2 20 (L) 09/27/2015 0834   BUN 29 (H) 10/29/2015 0457   BUN 28.3 (H) 09/27/2015 0834   CREATININE 1.92 (H) 10/29/2015 0457   CREATININE 2.1 (H) 09/27/2015 0834      Component Value Date/Time   CALCIUM 9.4 10/29/2015 0457   CALCIUM 9.1 09/27/2015 0834   ALKPHOS 49 09/27/2015 0834   AST 18 09/27/2015 0834   ALT 12 09/27/2015 0834   BILITOT 0.36 09/27/2015 0834     EXAM: NUCLEAR MEDICINE PET SKULL BASE TO THIGH  TECHNIQUE: 12.9 mCi F-18 FDG was injected intravenously. Full-ring PET imaging was performed from the skull base to  thigh after the radiotracer. CT data was obtained and used for attenuation correction and anatomic localization.  FASTING BLOOD GLUCOSE:  Value: 93 mg/dl  COMPARISON:  CT chest abdomen pelvis 10/27/2015.  FINDINGS: NECK  No hypermetabolic lymph nodes in the neck. CT images show no acute findings.  CHEST  No hypermetabolic mediastinal, hilar or axillary lymph nodes. No hypermetabolic pulmonary nodules. Coronary artery calcification. No pericardial or pleural effusion. 1.5 cm soft tissue nodule in the anteromedial left chest wall (CT image 84) does not show abnormal metabolism above blood pool.  ABDOMEN/PELVIS  Marked thickening of the gastric antrum is difficult to measure but has an SUV max of 49.7. 12 mm lymph node adjacent to the proximal duodenum (CT image 124) has an SUV max of 5.4. No abnormal hypermetabolism in the liver, adrenal glands, spleen or pancreas. No additional hypermetabolic lymph nodes. Liver, gallbladder and adrenal  glands are unremarkable. Low-attenuation lesions in the kidneys measure up to 6.3 cm on the right and are difficult to further characterize without post-contrast imaging. A 1.8 cm low-attenuation lesion in the lower pole right kidney has peripheral calcification. Spleen, pancreas, stomach and bowel are otherwise grossly unremarkable. Atherosclerotic calcification of the arterial vasculature. Right common iliac artery measures 2.2 cm and left common iliac artery measures 2.1 cm. No free fluid. Bilateral inguinal hernias contain fat. Brachytherapy seeds are seen in the prostate.  SKELETON  No abnormal osseous hypermetabolism. Degenerative changes are seen in the spine.  IMPRESSION: 1. Markedly hypermetabolic mass in the gastric antrum with a hypermetabolic lymph node adjacent to the proximal duodenum, most indicative of malignancy. No distant metastatic disease. 2. Aortic atherosclerosis (ICD10-170.0). Coronary  artery calcification. Bilateral common iliac artery aneurysms. 3. Soft tissue nodule in the anteromedial left chest wall does not exhibit malignant range hypermetabolism.     Impression and Plan:  70 year old woman with the following issues:  1. Normocytic, normochromic anemia presented initially with a hemoglobin of 9.2 now has drifted down to 6.0. His MCV is normal at 97 but does have an elevated RDW. His anemia is related to renal insufficiency as well as iron deficiency.   He is currently receiving Aranesp every 2 weeks to keep his hemoglobin above 10. We will also receive intravenous iron intermittently. He does require packet cell transfusion periodically if his hemoglobin drops below 8.  In anticipation for further therapy he will have 2 units of packed red cell transfusion immediate future.  2. Antral mass that is biopsy proven to be adenocarcinoma. He is status post EUS and PET CT scan which confirmed a staging of about T3 N2 disease. No evidence of distant metastasis noted.  His case was discussed and the GI tumor Board and he is a reasonable candidate for neoadjuvant chemotherapy and consideration for surgical resection. Different regimens were reviewed and FOLFOX utilizing infusional 5-FU, leucovorin and Oxalliplatin may be a reasonable option.  Risks and benefits of chemotherapy were reviewed complications include nausea, vomiting, myelosuppression and peripheral neuropathy are the major ones. Infusion related complications were also reviewed. I anticipate he will require 2 months of therapy which is about 4 cycles and repeat imaging before surgical considerations. He will be referred for chemotherapy education class.   Patient's already been evaluated by Dr. Barry Dienes and steps made towards boosting his nutritional status in anticipation for possible surgery.  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 His last protein  studies obtained in September 2017 showed no major changes and will be repeated annually.  4. Renal insufficiency: His creatinine remains stable at this time.  5. Antiemetics: Prescription for Compazine as made available to the patient today.  6. IV access: Port-A-Cath will be inserted by Dr. Barry Dienes next week and prescription for EMLA cream it available to the patient.  7. Follow-up: He follow-up in one week to start this first cycle of chemotherapy.   [place], MD 11/9/201712:29 PM

## 2015-11-22 NOTE — Telephone Encounter (Signed)
This RN called patient again to remind him of his appointment at [facility] tomorrow at 9:00 am. Patient verbalized understanding.

## 2015-11-22 NOTE — Telephone Encounter (Signed)
Per LOS I have scheduled appts ans notified the scheduler

## 2015-11-22 NOTE — Telephone Encounter (Signed)
Per Dr. Alen Blew, patient needs two units of blood for a hemoglobin of 7.3. I have arranged for him to go to [facility] tomorrow, 11/23/15 at 9:00 am for the transfusion. This message was left on his cell phone and home answering machine. Instructed patient to call [phone] and let us know that he got this message.

## 2015-11-22 NOTE — Telephone Encounter (Signed)
LVM confirming future scheduled appointments.

## 2015-11-23 ENCOUNTER — Encounter: Payer: Self-pay | Admitting: Pharmacist

## 2015-11-23 ENCOUNTER — Ambulatory Visit (HOSPITAL_COMMUNITY)
Admission: RE | Admit: 2015-11-23 | Discharge: 2015-11-23 | Disposition: A | Discharge status: Home / Self Care | Payer: Medicare Other | Source: Ambulatory Visit | Attending: Oncology | Admitting: Oncology

## 2015-11-23 DIAGNOSIS — C169 Malignant neoplasm of stomach, unspecified: Secondary | ICD-10-CM | POA: Diagnosis not present

## 2015-11-23 DIAGNOSIS — D649 Anemia, unspecified: Secondary | ICD-10-CM

## 2015-11-23 LAB — PREPARE RBC (CROSSMATCH)

## 2015-11-23 MED ORDER — SODIUM CHLORIDE 0.9 % IV SOLN
250.0000 mL | Freq: Once | INTRAVENOUS | Status: AC
  Administered 2015-11-23: 250 mL via INTRAVENOUS

## 2015-11-23 MED ORDER — ACETAMINOPHEN 325 MG PO TABS
650.0000 mg | ORAL_TABLET | Freq: Once | ORAL | Status: AC
  Administered 2015-11-23: 650 mg via ORAL
  Filled 2015-11-23: qty 2

## 2015-11-23 MED ORDER — DIPHENHYDRAMINE HCL 25 MG PO CAPS
50.0000 mg | ORAL_CAPSULE | Freq: Once | ORAL | Status: AC
  Administered 2015-11-23: 25 mg via ORAL
  Filled 2015-11-23: qty 2

## 2015-11-23 NOTE — Discharge Instructions (Signed)
Blood Transfusion, Care After  These instructions give you information about caring for yourself after your procedure. Your doctor may also give you more specific instructions. Call your doctor if you have any problems or questions after your procedure.   HOME CARE   Take medicines only as told by your doctor. Ask your doctor if you can take an over-the-counter pain reliever if you have a fever or headache a day or two after your procedure.   Return to your normal activities as told by your doctor.  GET HELP IF:    You develop redness or irritation at your IV site.   You have a fever, chills, or a headache that does not go away.   Your pee (urine) is darker than normal.   Your urine turns:    Pink.    Red.    Brown.   The white part of your eye turns yellow (jaundice).   You feel weak after doing your normal activities.  GET HELP RIGHT AWAY IF:    You have trouble breathing.   You have fever and chills and you also have:    Anxiety.    Chest or back pain.    Flushed or pink skin.    Clammy or sweaty skin.    A fast heartbeat.    A sick feeling in your stomach (nausea).     This information is not intended to replace advice given to you by your health care provider. Make sure you discuss any questions you have with your health care provider.     Document Released: 01/20/2014 Document Reviewed: 01/20/2014  Elsevier Interactive Patient Education 2016 [employer]vier Inc.

## 2015-11-23 NOTE — Progress Notes (Signed)
Patient ID: Jacob [name], male   DOB: Apr 03, 1945, 70 y.o.   MRN: [id number]    Provider: Zola Button MD  Associated Diagnosis:Anemia, unspecified type (D64.9)  Procedure: Transfusion of 2 units PRBC via PIV as ordered.  Patient tolerated transfusion well. No reactions. Went over discharge instructions and copy given to patient. Alert, oriented and ambulatory at time of discharge.

## 2015-11-25 LAB — TYPE AND SCREEN
ABO/RH(D): O POS
Antibody Screen: NEGATIVE
UNIT DIVISION: 0
Unit division: 0

## 2015-11-26 ENCOUNTER — Other Ambulatory Visit: Payer: Medicare Other

## 2015-11-26 ENCOUNTER — Encounter (HOSPITAL_COMMUNITY)
Admission: RE | Admit: 2015-11-26 | Discharge: 2015-11-26 | Disposition: A | Discharge status: Home / Self Care | Payer: Medicare Other | Source: Ambulatory Visit | Attending: General Surgery | Admitting: General Surgery

## 2015-11-26 ENCOUNTER — Other Ambulatory Visit: Payer: Self-pay | Admitting: General Surgery

## 2015-11-26 ENCOUNTER — Ambulatory Visit (HOSPITAL_COMMUNITY): Payer: Medicare Other | Admitting: Emergency Medicine

## 2015-11-26 ENCOUNTER — Encounter (HOSPITAL_COMMUNITY): Payer: Self-pay

## 2015-11-26 DIAGNOSIS — C163 Malignant neoplasm of pyloric antrum: Secondary | ICD-10-CM | POA: Insufficient documentation

## 2015-11-26 DIAGNOSIS — D500 Iron deficiency anemia secondary to blood loss (chronic): Secondary | ICD-10-CM | POA: Diagnosis not present

## 2015-11-26 DIAGNOSIS — Z8546 Personal history of malignant neoplasm of prostate: Secondary | ICD-10-CM | POA: Insufficient documentation

## 2015-11-26 DIAGNOSIS — Z01812 Encounter for preprocedural laboratory examination: Secondary | ICD-10-CM | POA: Diagnosis not present

## 2015-11-26 DIAGNOSIS — N189 Chronic kidney disease, unspecified: Secondary | ICD-10-CM | POA: Insufficient documentation

## 2015-11-26 DIAGNOSIS — E440 Moderate protein-calorie malnutrition: Secondary | ICD-10-CM | POA: Insufficient documentation

## 2015-11-26 DIAGNOSIS — I129 Hypertensive chronic kidney disease with stage 1 through stage 4 chronic kidney disease, or unspecified chronic kidney disease: Secondary | ICD-10-CM | POA: Insufficient documentation

## 2015-11-26 HISTORY — DX: Constipation, unspecified: K59.00

## 2015-11-26 HISTORY — DX: Personal history of other medical treatment: Z92.89

## 2015-11-26 HISTORY — DX: Chronic kidney disease, unspecified: N18.9

## 2015-11-26 LAB — COMPREHENSIVE METABOLIC PANEL
ALBUMIN: 3 g/dL — AB (ref 3.5–5.0)
ALT: 22 U/L (ref 17–63)
AST: 31 U/L (ref 15–41)
Alkaline Phosphatase: 43 U/L (ref 38–126)
Anion gap: 4 — ABNORMAL LOW (ref 5–15)
BUN: 26 mg/dL — AB (ref 6–20)
CHLORIDE: 107 mmol/L (ref 101–111)
CO2: 25 mmol/L (ref 22–32)
CREATININE: 2.03 mg/dL — AB (ref 0.61–1.24)
Calcium: 9.6 mg/dL (ref 8.9–10.3)
GFR calc Af Amer: 37 mL/min — ABNORMAL LOW (ref >60)
GFR, EST NON AFRICAN AMERICAN: 32 mL/min — AB (ref >60)
GLUCOSE: 85 mg/dL (ref 65–99)
POTASSIUM: 6.1 mmol/L — AB (ref 3.5–5.1)
Sodium: 136 mmol/L (ref 135–145)
Total Bilirubin: 0.3 mg/dL (ref 0.3–1.2)
Total Protein: 6.1 g/dL — ABNORMAL LOW (ref 6.5–8.1)

## 2015-11-26 LAB — CBC
HCT: 28 % — ABNORMAL LOW (ref 39.0–52.0)
Hemoglobin: 8.8 g/dL — ABNORMAL LOW (ref 13.0–17.0)
MCH: 29.1 pg (ref 26.0–34.0)
MCHC: 31.4 g/dL (ref 30.0–36.0)
MCV: 92.7 fL (ref 78.0–100.0)
PLATELETS: 217 10*3/uL (ref 150–400)
RBC: 3.02 MIL/uL — AB (ref 4.22–5.81)
RDW: 17.1 % — AB (ref 11.5–15.5)
WBC: 7.3 10*3/uL (ref 4.0–10.5)

## 2015-11-26 MED ORDER — CEFAZOLIN SODIUM-DEXTROSE 2-4 GM/100ML-% IV SOLN
2.0000 g | INTRAVENOUS | Status: DC
  Filled 2015-11-26: qty 100

## 2015-11-26 NOTE — Progress Notes (Signed)
Hemoglobin is 8.8, Potassium 6.1 and Creatine is 2.03.  I called these labs to [facility] [place].  Raquel Sarna informed Dr Barry Dienes, who said that they would recheck labs in am and surgery may be cancelled.

## 2015-11-26 NOTE — Anesthesia Preprocedure Evaluation (Deleted)
Anesthesia Evaluation  Patient identified by MRN, date of birth, ID band Patient awake    Reviewed: Allergy & Precautions, H&P , NPO status , Patient's Chart, lab work & pertinent test results, reviewed documented beta blocker date and time   Airway Mallampati: II  TM Distance: >3 FB Neck ROM: Full    Dental no notable dental hx. (+) Edentulous Upper, Edentulous Lower, Dental Advisory Given   Pulmonary neg pulmonary ROS, former smoker,    Pulmonary exam normal breath sounds clear to auscultation       Cardiovascular hypertension, Pt. on medications and Pt. on home beta blockers  Rhythm:Regular Rate:Normal     Neuro/Psych Anxiety Depression negative neurological ROS  negative psych ROS   GI/Hepatic Neg liver ROS, PUD,   Endo/Other  negative endocrine ROS  Renal/GU Renal InsufficiencyRenal disease  negative genitourinary   Musculoskeletal  (+) Arthritis , Osteoarthritis,    Abdominal   Peds  Hematology negative hematology ROS (+) anemia ,   Anesthesia Other Findings   Reproductive/Obstetrics negative OB ROS                            Anesthesia Physical Anesthesia Plan  ASA: III  Anesthesia Plan: General   Post-op Pain Management:    Induction: Intravenous  Airway Management Planned: LMA  Additional Equipment:   Intra-op Plan:   Post-operative Plan: Extubation in OR  Informed Consent: I have reviewed the patients History and Physical, chart, labs and discussed the procedure including the risks, benefits and alternatives for the proposed anesthesia with the patient or authorized representative who has indicated his/her understanding and acceptance.   Dental advisory given  Plan Discussed with: CRNA  Anesthesia Plan Comments:         Anesthesia Quick Evaluation

## 2015-11-26 NOTE — Pre-Procedure Instructions (Signed)
    Ryun Velez  11/26/2015     Your procedure is scheduled on Tuesday, November 14.  Report to [facility] Admitting at 0530 AM                 Your surgery or procedure is scheduled for 7:30 AM    Call this number if you have problems the morning of [phone]   Remember:  Do not eat food or drink liquids after midnight.  Take these medicines the morning of surgery with A SIP OF WATER :buPROPion (WELLBUTRIN XL), Nebivolol HCl (BYSTOLIC), tamsulosin (FLOMAX).  May use eye drops.               May take Tylenol if needed.   Do not wear jewelry, make-up or nail polish.  Do not wear lotions, powders, or perfumes, or deodorant.  Men may shave face and neck.  Do not bring valuables to the hospital.  [facility] is not responsible for any belongings or valuables.  Contacts, dentures or bridgework may not be worn into surgery.  Leave your suitcase in the car.  After surgery it may be brought to your room.  For patients admitted to the hospital, discharge time will be determined by your treatment team.  Patients discharged the day of surgery will not be allowed to drive home.   Special instructions:  Review  [facility] - Preparing For Surgery.  Please read over the following fact sheets that you were given. [facility]- Preparing For Surgery and Patient Instructions for Mupirocin Application, Incentive Spirometry, Pain Booklet

## 2015-11-27 ENCOUNTER — Other Ambulatory Visit: Payer: Self-pay

## 2015-11-27 ENCOUNTER — Encounter (HOSPITAL_COMMUNITY)
Admission: RE | Disposition: A | Discharge status: Home / Self Care | Payer: Self-pay | Source: Ambulatory Visit | Attending: Emergency Medicine

## 2015-11-27 ENCOUNTER — Emergency Department (HOSPITAL_COMMUNITY)
Admission: EM | Admit: 2015-11-27 | Discharge: 2015-11-27 | Discharge status: Absent without leave | Payer: Medicare Other

## 2015-11-27 ENCOUNTER — Emergency Department (HOSPITAL_COMMUNITY)
Admission: RE | Admit: 2015-11-27 | Discharge: 2015-11-27 | Disposition: A | Discharge status: Home / Self Care | Payer: Medicare Other | Source: Ambulatory Visit | Attending: Emergency Medicine | Admitting: Emergency Medicine

## 2015-11-27 ENCOUNTER — Encounter (HOSPITAL_COMMUNITY): Payer: Self-pay | Admitting: Urology

## 2015-11-27 DIAGNOSIS — N189 Chronic kidney disease, unspecified: Secondary | ICD-10-CM | POA: Insufficient documentation

## 2015-11-27 DIAGNOSIS — I129 Hypertensive chronic kidney disease with stage 1 through stage 4 chronic kidney disease, or unspecified chronic kidney disease: Secondary | ICD-10-CM | POA: Insufficient documentation

## 2015-11-27 DIAGNOSIS — Z87891 Personal history of nicotine dependence: Secondary | ICD-10-CM | POA: Insufficient documentation

## 2015-11-27 DIAGNOSIS — E875 Hyperkalemia: Secondary | ICD-10-CM

## 2015-11-27 DIAGNOSIS — R600 Localized edema: Secondary | ICD-10-CM | POA: Insufficient documentation

## 2015-11-27 DIAGNOSIS — Z8546 Personal history of malignant neoplasm of prostate: Secondary | ICD-10-CM | POA: Diagnosis not present

## 2015-11-27 DIAGNOSIS — Z85028 Personal history of other malignant neoplasm of stomach: Secondary | ICD-10-CM | POA: Insufficient documentation

## 2015-11-27 LAB — CBC WITH DIFFERENTIAL/PLATELET
BASOS PCT: 0 %
Basophils Absolute: 0 10*3/uL (ref 0.0–0.1)
EOS ABS: 0.2 10*3/uL (ref 0.0–0.7)
EOS PCT: 3 %
HCT: 26.7 % — ABNORMAL LOW (ref 39.0–52.0)
Hemoglobin: 8.3 g/dL — ABNORMAL LOW (ref 13.0–17.0)
Lymphocytes Relative: 17 %
Lymphs Abs: 1.1 10*3/uL (ref 0.7–4.0)
MCH: 29 pg (ref 26.0–34.0)
MCHC: 31.1 g/dL (ref 30.0–36.0)
MCV: 93.4 fL (ref 78.0–100.0)
MONO ABS: 0.6 10*3/uL (ref 0.1–1.0)
MONOS PCT: 9 %
NEUTROS PCT: 71 %
Neutro Abs: 4.8 10*3/uL (ref 1.7–7.7)
PLATELETS: 187 10*3/uL (ref 150–400)
RBC: 2.86 MIL/uL — ABNORMAL LOW (ref 4.22–5.81)
RDW: 17.3 % — AB (ref 11.5–15.5)
WBC: 6.8 10*3/uL (ref 4.0–10.5)

## 2015-11-27 LAB — BASIC METABOLIC PANEL
ANION GAP: 4 — AB (ref 5–15)
BUN: 28 mg/dL — ABNORMAL HIGH (ref 6–20)
CALCIUM: 9.3 mg/dL (ref 8.9–10.3)
CHLORIDE: 111 mmol/L (ref 101–111)
CO2: 23 mmol/L (ref 22–32)
CREATININE: 2.09 mg/dL — AB (ref 0.61–1.24)
GFR calc Af Amer: 35 mL/min — ABNORMAL LOW (ref >60)
GFR calc non Af Amer: 30 mL/min — ABNORMAL LOW (ref >60)
GLUCOSE: 80 mg/dL (ref 65–99)
Potassium: 5.3 mmol/L — ABNORMAL HIGH (ref 3.5–5.1)
Sodium: 138 mmol/L (ref 135–145)

## 2015-11-27 LAB — COMPREHENSIVE METABOLIC PANEL
ALBUMIN: 2.9 g/dL — AB (ref 3.5–5.0)
ALT: 20 U/L (ref 17–63)
ANION GAP: 4 — AB (ref 5–15)
AST: 28 U/L (ref 15–41)
Alkaline Phosphatase: 41 U/L (ref 38–126)
BUN: 32 mg/dL — ABNORMAL HIGH (ref 6–20)
CO2: 24 mmol/L (ref 22–32)
Calcium: 9.3 mg/dL (ref 8.9–10.3)
Chloride: 108 mmol/L (ref 101–111)
Creatinine, Ser: 2.15 mg/dL — ABNORMAL HIGH (ref 0.61–1.24)
GFR calc Af Amer: 34 mL/min — ABNORMAL LOW (ref >60)
GFR calc non Af Amer: 29 mL/min — ABNORMAL LOW (ref >60)
GLUCOSE: 83 mg/dL (ref 65–99)
POTASSIUM: 6 mmol/L — AB (ref 3.5–5.1)
SODIUM: 136 mmol/L (ref 135–145)
Total Bilirubin: 0.6 mg/dL (ref 0.3–1.2)
Total Protein: 5.8 g/dL — ABNORMAL LOW (ref 6.5–8.1)

## 2015-11-27 LAB — PROTIME-INR
INR: 1.06
PROTHROMBIN TIME: 13.9 s (ref 11.4–15.2)

## 2015-11-27 LAB — POCT I-STAT 4, (NA,K, GLUC, HGB,HCT)
Glucose, Bld: 81 mg/dL (ref 65–99)
HEMATOCRIT: 25 % — AB (ref 39.0–52.0)
HEMOGLOBIN: 8.5 g/dL — AB (ref 13.0–17.0)
Potassium: 6.2 mmol/L — ABNORMAL HIGH (ref 3.5–5.1)
Sodium: 139 mmol/L (ref 135–145)

## 2015-11-27 LAB — ABO/RH: ABO/RH(D): O POS

## 2015-11-27 LAB — TYPE AND SCREEN
ABO/RH(D): O POS
ANTIBODY SCREEN: NEGATIVE

## 2015-11-27 LAB — APTT: APTT: 33 s (ref 24–36)

## 2015-11-27 LAB — LIPASE, BLOOD: Lipase: 49 U/L (ref 11–51)

## 2015-11-27 SURGERY — INSERTION, TUNNELED CENTRAL VENOUS DEVICE, WITH PORT
Anesthesia: General

## 2015-11-27 MED ORDER — CHLORHEXIDINE GLUCONATE CLOTH 2 % EX PADS: 6.0000 | MEDICATED_PAD | Freq: Once | CUTANEOUS | Status: DC

## 2015-11-27 MED ORDER — DEXTROSE 50 % IV SOLN
50.0000 mL | Freq: Once | INTRAVENOUS | Status: AC
  Administered 2015-11-27: 50 mL via INTRAVENOUS
  Filled 2015-11-27: qty 50

## 2015-11-27 MED ORDER — CEFAZOLIN SODIUM-DEXTROSE 2-4 GM/100ML-% IV SOLN
INTRAVENOUS | Status: AC
  Filled 2015-11-27: qty 100

## 2015-11-27 MED ORDER — SODIUM CHLORIDE 0.9 % IV SOLN
1.0000 g | Freq: Once | INTRAVENOUS | Status: AC
  Administered 2015-11-27: 1 g via INTRAVENOUS
  Filled 2015-11-27: qty 10

## 2015-11-27 MED ORDER — FENTANYL CITRATE (PF) 100 MCG/2ML IJ SOLN
INTRAMUSCULAR | Status: AC
  Filled 2015-11-27: qty 2

## 2015-11-27 MED ORDER — SODIUM CHLORIDE 0.9 % IV BOLUS (SEPSIS)
500.0000 mL | Freq: Once | INTRAVENOUS | Status: AC
  Administered 2015-11-27: 500 mL via INTRAVENOUS

## 2015-11-27 MED ORDER — INSULIN ASPART 100 UNIT/ML IV SOLN: 10.0000 [IU] | Freq: Once | INTRAVENOUS | Status: DC

## 2015-11-27 MED ORDER — PROPOFOL 10 MG/ML IV BOLUS
INTRAVENOUS | Status: AC
  Filled 2015-11-27: qty 20

## 2015-11-27 MED ORDER — SODIUM POLYSTYRENE SULFONATE 15 GM/60ML PO SUSP
30.0000 g | Freq: Once | ORAL | Status: AC
  Administered 2015-11-27: 30 g via RECTAL
  Filled 2015-11-27: qty 120

## 2015-11-27 MED ORDER — INSULIN ASPART 100 UNIT/ML IV SOLN
5.0000 [IU] | Freq: Once | INTRAVENOUS | Status: AC
  Administered 2015-11-27: 5 [IU] via INTRAVENOUS
  Filled 2015-11-27: qty 1

## 2015-11-27 NOTE — ED Triage Notes (Signed)
Pt was sent down from per op for a potassium level of 6.2. Pt was having a port placed for stomach cancer today and has had a potassium greater than 6 and creatine of 2 per nurse that transported pt to ED.

## 2015-11-27 NOTE — ED Provider Notes (Signed)
[facility] DEPT Provider Note   CSN: [id number] Arrival date & time: 11/27/15  0810     History   Chief Complaint Chief Complaint  Patient presents with  . Abnormal Lab    HPI Jacob [name] is a 70 y.o. male.  HPI Patient with history of gastric cancer and history of upper GI bleed presents today for port placement. Lab work showed elevated potassium. Patient does have history of chronic kidney disease but is not on hemodialysis. States he has ongoing melena but this is unchanged for the past few months. Denies any nausea or vomiting. Denies any pain including chest pain or abdominal pain at this time. Does not believe there is been any recent changes to his medications. Past Medical History:  Diagnosis Date  . Anemia    hx iron deficiency  . Anxiety   . Arthritis    gout  . Back pain   . BPH with obstruction/lower urinary tract symptoms   . Chronic kidney disease    "they said I do"  . Constipation   . Depression   . ED (erectile dysfunction)   . Epigastric pain   . Heartburn   . History of blood transfusion   . History of radiation therapy 11/03/11-12/29/11   prostate  . Hypercholesterolemia   . Hypertension   . Night sweats   . Prostate cancer ([place]) 08/14/11   Adenocarcinoma,gleason:3+3=6,& 3+4=7,PSA=5.66  . PUD (peptic ulcer disease)   . Ulcer ([place])    peptic ulcer hx    Patient Active Problem List   Diagnosis Date Noted  . Gastric adenocarcinoma ([place])   . Gastric mass   . GI bleed 10/26/2015  . Gastrointestinal hemorrhage   . PUD (peptic ulcer disease)   . 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 Nocturnal hypoxemia 11/04/2013  . Noncompliance with treatment 09/02/2013  . Prostate cancer ([place]) 08/14/2011  . Iron deficiency anemia 03/19/2007  . DEPRESSION 03/19/2007  . Essential hypertension 03/19/2007  . NEOPLASM, BENIGN, STOMACH 11/23/2006  . POLYP, COLON 11/23/2006  . INTERNAL HEMORRHOIDS 11/23/2006  . GASTRITIS 11/23/2006     Past Surgical History:  Procedure Laterality Date  . colon polyps bx  11/24/06   colon,transverse and rectosigmoid polyps:tubular adenomas and hyperplastic polyps,no high grade dysplasia or malignancy   . duodenal bx  11/24/06   benign  . ESOPHAGOGASTRODUODENOSCOPY N/A 10/27/2015   Procedure: ESOPHAGOGASTRODUODENOSCOPY (EGD);  Surgeon: [name], MD;  Location: [facility];  Service: Endoscopy;  Laterality: N/A;  . EUS N/A 11/08/2015   Procedure: UPPER ENDOSCOPIC ULTRASOUND (EUS) RADIAL;  Surgeon: [name], MD;  Location: [facility];  Service: Endoscopy;  Laterality: N/A;  . gastric bx  11/24/06   chronic active gastritis,with metaplasia and focal changaes of xanthelasma  . INSERTION PROSTATE RADIATION SEED  12-29-11  . PROSTATE BIOPSY  08/14/11   Adenocarcinoma/volume=58.51cc,gleason=3+3=6 & 3+4=7  . TONSILLECTOMY     70 years old       Home Medications    Prior to Admission medications   Medication Sig Start Date End Date Taking? Authorizing Provider  acetaminophen (TYLENOL) 500 MG tablet Take 1,000 mg by mouth daily as needed for moderate pain or headache.   Yes Historical Provider, MD  allopurinol (ZYLOPRIM) 300 MG tablet Take 300 mg by mouth daily.  05/10/15  Yes Historical Provider, MD  buPROPion (WELLBUTRIN XL) 300 MG 24 hr tablet Take 300 mg by mouth daily.  05/10/15  Yes Historical Provider, MD  calcium carbonate (TUMS - DOSED IN  MG ELEMENTAL CALCIUM) 500 MG chewable tablet Chew 3 tablets by mouth 2 (two) times daily as needed for indigestion or heartburn.   Yes Historical Provider, MD  diazepam (VALIUM) 5 MG tablet Take 5 mg by mouth at bedtime as needed (sleep).    Yes Historical Provider, MD  feeding supplement, GLUCERNA SHAKE, (GLUCERNA SHAKE) LIQD Take 237 mLs by mouth daily.    Yes Historical Provider, MD  furosemide (LASIX) 80 MG tablet Take 40 mg by mouth daily.    Yes Historical Provider, MD  gabapentin (NEURONTIN) 800 MG tablet Take 400 mg by mouth  at bedtime.    Yes Historical Provider, MD  latanoprost (XALATAN) 0.005 % ophthalmic solution Place 1 drop into both eyes at bedtime.   Yes Historical Provider, MD  Nebivolol HCl (BYSTOLIC) 20 MG TABS Take 20 mg by mouth daily.    Yes Historical Provider, MD  omeprazole (PRILOSEC) 20 MG capsule Take 20 mg by mouth daily.   Yes Historical Provider, MD  tamsulosin (FLOMAX) 0.4 MG CAPS capsule TAKE 1 CAPSULE BY MOUTH DAILY 06/19/14  Yes Arloa Koh, MD  traZODone (DESYREL) 50 MG tablet Take 50 mg by mouth at bedtime.  05/10/15  Yes Historical Provider, MD  Verapamil HCl CR 300 MG CP24 Take 300 mg by mouth at bedtime.  04/01/15  Yes Historical Provider, MD  influenza vac recom quadrivalent (FLUBLOK) 0.5 ML injection Inject 0.5 mLs into the muscle once.    Historical Provider, MD  lidocaine-prilocaine (EMLA) cream Apply 1 application topically as needed. Apply to port before chemotherapy. 11/22/15   Wyatt Portela, MD  Melatonin 10 MG TABS Take 1 tablet by mouth at bedtime as needed (sleep).     Historical Provider, MD  OXYGEN Inhale 2 L into the lungs at bedtime as needed (for shortness of breath).    Historical Provider, MD  prochlorperazine (COMPAZINE) 10 MG tablet Take 1 tablet (10 mg total) by mouth every 6 (six) hours as needed for nausea or vomiting. 11/22/15   Wyatt Portela, MD    Family History Family History  Problem Relation Age of Onset  . Alcohol abuse Father   . Alcohol abuse Brother 50  . Colon cancer Neg Hx     Social History Social History  Substance Use Topics  . Smoking status: Former Smoker    Packs/day: 1.50    Years: 30.00    Types: Cigarettes    Quit date: 04/13/2000  . Smokeless tobacco: Never Used  . Alcohol use 0.6 oz/week    1 Cans of beer per week     Comment: 1-2 beers every other night     Allergies   Patient has no known allergies.   Review of Systems Review of Systems  Constitutional: Negative for chills, fatigue and fever.  Respiratory: Negative for  shortness of breath.   Cardiovascular: Negative for chest pain.  Gastrointestinal: Negative for abdominal pain, constipation, diarrhea, nausea and vomiting.  Genitourinary: Negative for dysuria and frequency.  Musculoskeletal: Negative for back pain, myalgias, neck pain and neck stiffness.  Skin: Negative for rash and wound.  Neurological: Negative for dizziness, weakness, light-headedness, numbness and headaches.  All other systems reviewed and are negative.    Physical Exam Updated Vital Signs BP 129/70   Pulse 69   Temp 98.4 F (36.9 C) (Oral)   Resp 21   Ht 6\' 2"  (1.88 m)   Wt 260 lb (117.9 kg)   SpO2 97%   BMI 33.38 kg/m  Physical Exam  Constitutional: He is oriented to person, place, and time. He appears well-developed and well-nourished.  HENT:  Head: Normocephalic and atraumatic.  Mouth/Throat: Oropharynx is clear and moist. No oropharyngeal exudate.  Eyes: EOM are normal. Pupils are equal, round, and reactive to light.  Neck: Normal range of motion. Neck supple.  Cardiovascular: Normal rate and regular rhythm.   Pulmonary/Chest: Effort normal and breath sounds normal. No respiratory distress. He has no wheezes. He has no rales. He exhibits no tenderness.  Abdominal: Soft. Bowel sounds are normal. There is no tenderness. There is no rebound and no guarding.  Musculoskeletal: Normal range of motion. He exhibits edema. He exhibits no tenderness.  1+ bilateral lower extremity pitting edema.  Neurological: He is alert and oriented to person, place, and time.  Skin: Skin is warm and dry. Capillary refill takes less than 2 seconds. No rash noted. No erythema.  Psychiatric: He has a normal mood and affect. His behavior is normal.  Nursing note and vitals reviewed.    ED Treatments / Results  Labs (all labs ordered are listed, but only abnormal results are displayed) Labs Reviewed  CBC WITH DIFFERENTIAL/PLATELET - Abnormal; Notable for the following:       Result  Value   RBC 2.86 (*)    Hemoglobin 8.3 (*)    HCT 26.7 (*)    RDW 17.3 (*)    All other components within normal limits  COMPREHENSIVE METABOLIC PANEL - Abnormal; Notable for the following:    Potassium 6.0 (*)    BUN 32 (*)    Creatinine, Ser 2.15 (*)    Total Protein 5.8 (*)    Albumin 2.9 (*)    GFR calc non Af Amer 29 (*)    GFR calc Af Amer 34 (*)    Anion gap 4 (*)    All other components within normal limits  BASIC METABOLIC PANEL - Abnormal; Notable for the following:    Potassium 5.3 (*)    BUN 28 (*)    Creatinine, Ser 2.09 (*)    GFR calc non Af Amer 30 (*)    GFR calc Af Amer 35 (*)    Anion gap 4 (*)    All other components within normal limits  POCT I-STAT 4, (NA,K, GLUC, HGB,HCT) - Abnormal; Notable for the following:    Potassium 6.2 (*)    HCT 25.0 (*)    Hemoglobin 8.5 (*)    All other components within normal limits  LIPASE, BLOOD  PROTIME-INR  APTT  TYPE AND SCREEN  ABO/RH    EKG  EKG Interpretation None       Radiology No results found.  Procedures Procedures (including critical care time)  Medications Ordered in ED Medications  sodium chloride 0.9 % bolus 500 mL (0 mLs Intravenous Stopped 11/27/15 1256)  sodium chloride 0.9 % bolus 500 mL (0 mLs Intravenous Stopped 11/27/15 1256)  dextrose 50 % solution 50 mL (50 mLs Intravenous Given 11/27/15 1015)  calcium gluconate 1 g in sodium chloride 0.9 % 100 mL IVPB (0 g Intravenous Stopped 11/27/15 1256)  sodium polystyrene (KAYEXALATE) 15 GM/60ML suspension 30 g (30 g Rectal Given 11/27/15 1015)  insulin aspart (novoLOG) injection 5 Units (5 Units Intravenous Given 11/27/15 1015)     Initial Impression / Assessment and Plan / ED Course  I have reviewed the triage vital signs and the nursing notes.  Pertinent labs & imaging results that were available during my care of the patient  were reviewed by me and considered in my medical decision making (see chart for details).  Clinical Course      Repeat potassium is 5.3. Vital signs stable. We'll discharge home to follow-up with private physician to 3 days to have potassium rechecked. Return precautions given.   Final Clinical Impressions(s) / ED Diagnoses   Final diagnoses:  Hyperkalemia    New Prescriptions New Prescriptions   No medications on file     Julianne Rice, MD 11/27/15 1520

## 2015-11-27 NOTE — ED Notes (Signed)
Attempted to obtain BMP through IV but unable to draw back blood.  Contacted phlebotomy for blood draw request.

## 2015-11-27 NOTE — Progress Notes (Signed)
Pt potassium 6.2 this AM, Dr. Ola Spurr notified, Dr. Barry Dienes aware. Pt surgery cancelled and pt transported to ER per Dr. Marlowe Aschoff order.

## 2015-11-27 NOTE — Consult Note (Signed)
[facility] Consult Note     Date: 11/27/2015                  Patient Name:  Jacob [name]  MRN: [id number]  DOB: [birth year]  Age / Sex: 70 y.o., male         PCP: Tivis Ringer, MD                 Service Requesting Consult: Gen Surg/ ED                  Reason for Consult: hyperkalemia            Chief Complaint: hyperkalemia  HPI: Pt is a 53M with a PMH significant for recently diagnosed gastric adenocarcinoma and CKD G3b; labile creatinines of 1.6-2.9, and iron deficiency anemia who is now seen in consultation at the request of Dr. Barry Dienes of Gen Surg for evaluation and management of hyperkalemia.  Pt was supposed to have a port placed to start chemo for gastric cancer and was noted to have a K of 6.2.    He has been thirsty for several days.  Has not been drinking a lot of water; has been actually drinking exceesive amounts of orange juice (whole jug in one day) and tomato juice.  Had previously been on valsartan but this has been stopped for one month.    Feeling "fine" today.  Past Medical History:  Diagnosis Date  . Anemia    hx iron deficiency  . Anxiety   . Arthritis    gout  . Back pain   . BPH with obstruction/lower urinary tract symptoms   . Chronic kidney disease    "they said I do"  . Constipation   . Depression   . ED (erectile dysfunction)   . Epigastric pain   . Heartburn   . History of blood transfusion   . History of radiation therapy 11/03/11-12/29/11   prostate  . Hypercholesterolemia   . Hypertension   . Night sweats   . Prostate cancer ([place]) 08/14/11   Adenocarcinoma,gleason:3+3=6,& 3+4=7,PSA=5.66  . PUD (peptic ulcer disease)   . Ulcer ([place])    peptic ulcer hx    Past Surgical History:  Procedure Laterality Date  . colon polyps bx  11/24/06   colon,transverse and rectosigmoid polyps:tubular adenomas and hyperplastic polyps,no high grade dysplasia or malignancy   . duodenal bx  11/24/06   benign  .  ESOPHAGOGASTRODUODENOSCOPY N/A 10/27/2015   Procedure: ESOPHAGOGASTRODUODENOSCOPY (EGD);  Surgeon: [name], MD;  Location: [facility];  Service: Endoscopy;  Laterality: N/A;  . EUS N/A 11/08/2015   Procedure: UPPER ENDOSCOPIC ULTRASOUND (EUS) RADIAL;  Surgeon: [name], MD;  Location: [facility];  Service: Endoscopy;  Laterality: N/A;  . gastric bx  11/24/06   chronic active gastritis,with metaplasia and focal changaes of xanthelasma  . INSERTION PROSTATE RADIATION SEED  12-29-11  . PROSTATE BIOPSY  08/14/11   Adenocarcinoma/volume=58.51cc,gleason=3+3=6 & 3+4=7  . TONSILLECTOMY     70 years old    Family History  Problem Relation Age of Onset  . Alcohol abuse Father   . Alcohol abuse Brother 50  . Colon cancer Neg Hx    Social History:  reports that he quit smoking about 15 years ago. His smoking use included Cigarettes. He has a 45.00 pack-year smoking history. He has never used smokeless tobacco. He reports that he drinks about 0.6 oz of alcohol per week . He reports that he does not  use drugs.  Allergies: No Known Allergies   (Not in a hospital admission)  Results for orders placed or performed during the hospital encounter of 11/27/15 (from the past 48 hour(s))  I-STAT 4, (NA,K, GLUC, HGB,HCT)     Status: Abnormal   Collection Time: 11/27/15  7:05 AM  Result Value Ref Range   Sodium 139 135 - 145 mmol/L   Potassium 6.2 (H) 3.5 - 5.1 mmol/L   Glucose, Bld 81 65 - 99 mg/dL   HCT 25.0 (L) 39.0 - 52.0 %   Hemoglobin 8.5 (L) 13.0 - 17.0 g/dL  CBC with Differential/Platelet     Status: Abnormal   Collection Time: 11/27/15  8:25 AM  Result Value Ref Range   WBC 6.8 4.0 - 10.5 K/uL   RBC 2.86 (L) 4.22 - 5.81 MIL/uL   Hemoglobin 8.3 (L) 13.0 - 17.0 g/dL   HCT 26.7 (L) 39.0 - 52.0 %   MCV 93.4 78.0 - 100.0 fL   MCH 29.0 26.0 - 34.0 pg   MCHC 31.1 30.0 - 36.0 g/dL   RDW 17.3 (H) 11.5 - 15.5 %   Platelets 187 150 - 400 K/uL   Neutrophils Relative % 71 %    Neutro Abs 4.8 1.7 - 7.7 K/uL   Lymphocytes Relative 17 %   Lymphs Abs 1.1 0.7 - 4.0 K/uL   Monocytes Relative 9 %   Monocytes Absolute 0.6 0.1 - 1.0 K/uL   Eosinophils Relative 3 %   Eosinophils Absolute 0.2 0.0 - 0.7 K/uL   Basophils Relative 0 %   Basophils Absolute 0.0 0.0 - 0.1 K/uL  Protime-INR     Status: None   Collection Time: 11/27/15  8:25 AM  Result Value Ref Range   Prothrombin Time 13.9 11.4 - 15.2 seconds   INR 1.06   APTT     Status: None   Collection Time: 11/27/15  8:25 AM  Result Value Ref Range   aPTT 33 24 - 36 seconds   No results found.  ROS: all other systems reviewed and are negative.  Blood pressure 134/86, pulse 69, temperature 98.4 F (36.9 C), temperature source Oral, resp. rate 19, height 6\' 2"  (1.88 m), weight 117.9 kg (260 lb), SpO2 98 %. Physical Exam   GEN: NAD HEENT: NECK EOMI, PERRL, dry MM PULM: CTAB no c/w/r CV RRR, no peaked T waves on monitor ABD soft, TTP epigastrum, no rebound/ guarding EXT: trace LE edema NEURO: AAO x 3  Assessment/Plan Pt is a 35M with a PMH significant for recently diagnosed gastric adenocarcinoma and CKD G3b; labile creatinines of 1.6-2.9, and iron deficiency anemia p/w hyperkalemia.  1.  Hyperkalemia: likely a combination of mild vol depletion, excessive potassium intake from orange and tomato juice, and chronic blood loss from gastric adenocarcinoma.   - would give IV NS bolus of at least 500 cc, allow to drink to thirst if not going to be NPO.  If he will be NPO still, give 1L NS. - recommend treating with Ca gluconate 1 g IV  - recommend giving PR kayexelate (would avoid PO in setting of gastric cancer) to eliminate K; insulin/dextrose would not eliminate excess K - low potassium diet - recheck K in 4-6 hours from giving treatment; if normalized, can discharge from a nephrology perspective with close PCP or nephrology followup with repeat labs in ~3-4 days (I will notify pt's primary nephrologist he is  here)  2.  CKD: labile creatinines and fluctuates with volume status.   -  would recommend holding Lasix when not eating/ drinking appropriately - not on ARB  3.  Gastric cancer: still will need port placed.     Madelon Lips, MD [place] [phone] 11/27/2015, 8:58 AM

## 2015-11-27 NOTE — Progress Notes (Signed)
Patient ID: Jacob [name], male   DOB: 28-Jun-1945, 70 y.o.   MRN: [id number] Pt had K 6.1 in pre op yesterday.  Was encouraged to drink more, but K this AM 6.2.  Has chronic renal insufficiency seen by Dr. Joelyn Oms in September.  HCT was up to 27-29 and was 28 in pre op yesterday, but looking back he had a HCT of 23.1 5 days ago.  Got 2 units of blood at the cancer center last week.    Discussed briefly with medicine and renal.  Possible need for admit, but may be able to be hydrated and get kayexalate and go home.  If so, may be able to reschedule port this week.    Will send to ED for assessment and tx of hyperkalemia/dehydration.

## 2015-11-28 ENCOUNTER — Ambulatory Visit: Payer: Medicare Other | Admitting: Nutrition

## 2015-11-28 ENCOUNTER — Ambulatory Visit (HOSPITAL_BASED_OUTPATIENT_CLINIC_OR_DEPARTMENT_OTHER): Payer: Medicare Other | Admitting: Genetic Counselor

## 2015-11-28 ENCOUNTER — Other Ambulatory Visit: Payer: Medicare Other

## 2015-11-28 ENCOUNTER — Encounter: Payer: Self-pay | Admitting: Genetic Counselor

## 2015-11-28 ENCOUNTER — Other Ambulatory Visit (HOSPITAL_BASED_OUTPATIENT_CLINIC_OR_DEPARTMENT_OTHER): Payer: Medicare Other

## 2015-11-28 ENCOUNTER — Ambulatory Visit: Payer: Medicare Other

## 2015-11-28 DIAGNOSIS — C169 Malignant neoplasm of stomach, unspecified: Secondary | ICD-10-CM | POA: Diagnosis not present

## 2015-11-28 DIAGNOSIS — C61 Malignant neoplasm of prostate: Secondary | ICD-10-CM | POA: Diagnosis not present

## 2015-11-28 DIAGNOSIS — D126 Benign neoplasm of colon, unspecified: Secondary | ICD-10-CM | POA: Diagnosis not present

## 2015-11-28 LAB — CBC WITH DIFFERENTIAL/PLATELET
BASO%: 0.6 % (ref 0.0–2.0)
BASOS ABS: 0 10*3/uL (ref 0.0–0.1)
EOS%: 3.7 % (ref 0.0–7.0)
Eosinophils Absolute: 0.2 10*3/uL (ref 0.0–0.5)
HCT: 26.6 % — ABNORMAL LOW (ref 38.4–49.9)
HGB: 8.4 g/dL — ABNORMAL LOW (ref 13.0–17.1)
LYMPH%: 10.5 % — AB (ref 14.0–49.0)
MCH: 29.1 pg (ref 27.2–33.4)
MCHC: 31.6 g/dL — AB (ref 32.0–36.0)
MCV: 92.2 fL (ref 79.3–98.0)
MONO#: 0.5 10*3/uL (ref 0.1–0.9)
MONO%: 7.9 % (ref 0.0–14.0)
NEUT#: 5.1 10*3/uL (ref 1.5–6.5)
NEUT%: 77.3 % — AB (ref 39.0–75.0)
PLATELETS: 209 10*3/uL (ref 140–400)
RBC: 2.89 10*6/uL — AB (ref 4.20–5.82)
RDW: 18 % — ABNORMAL HIGH (ref 11.0–14.6)
WBC: 6.5 10*3/uL (ref 4.0–10.3)
lymph#: 0.7 10*3/uL — ABNORMAL LOW (ref 0.9–3.3)

## 2015-11-28 LAB — COMPREHENSIVE METABOLIC PANEL
ALT: 25 U/L (ref 0–55)
ANION GAP: 7 meq/L (ref 3–11)
AST: 35 U/L — ABNORMAL HIGH (ref 5–34)
Albumin: 2.8 g/dL — ABNORMAL LOW (ref 3.5–5.0)
Alkaline Phosphatase: 51 U/L (ref 40–150)
BUN: 24.5 mg/dL (ref 7.0–26.0)
CALCIUM: 9.2 mg/dL (ref 8.4–10.4)
CHLORIDE: 108 meq/L (ref 98–109)
CO2: 23 meq/L (ref 22–29)
Creatinine: 1.9 mg/dL — ABNORMAL HIGH (ref 0.7–1.3)
EGFR: 40 mL/min/{1.73_m2} — AB (ref >90)
Glucose: 89 mg/dl (ref 70–140)
POTASSIUM: 4.9 meq/L (ref 3.5–5.1)
Sodium: 137 mEq/L (ref 136–145)
Total Bilirubin: 0.36 mg/dL (ref 0.20–1.20)
Total Protein: 6.3 g/dL — ABNORMAL LOW (ref 6.4–8.3)

## 2015-11-28 NOTE — Progress Notes (Signed)
REFERRING PROVIDER: Prince Solian, MD [name], [name], MD  PRIMARY PROVIDER:  Tivis Ringer, MD  PRIMARY REASON FOR VISIT:  1. Gastric adenocarcinoma ([place])   2. Prostate cancer ([place])   3. Benign neoplasm of colon, unspecified part of colon      HISTORY OF PRESENT ILLNESS:   Jacob [name], a 70 y.o. male, was seen for a [facility] cancer genetics consultation at the request of Dr. Alen [name] due to a personal and family history of cancer.  Jacob [name] presents to clinic today to discuss the possibility of a hereditary predisposition to cancer, genetic testing, and to further clarify his future cancer risks, as well as potential cancer risks for family members.   In 2013, at the age of 85, Jacob [name] was diagnosed with prostate cancer.  The Gleason score was 7.  In 2017, at the age of 77, Jacob [name] was diagnosed with stomach cancer.  He was supposed to receive a port last week, but he had elevated potassium levels.  He was treated with a blood transfusion on 11/23/2015 due to anemia.  This was with packed RBC.  Jacob [name] has a history of colon polyps, with a colonoscopy in 2008 having 10 tubular adenoma's in the colon, transverse and rectosigmoid area, and >30 smaller, hyperplastic appearing rectal and distal sigmoid colon polyps.     CANCER HISTORY:   No history exists.       Past Medical History:  Diagnosis Date  . Anemia    hx iron deficiency  . Anxiety   . Arthritis    gout  . Back pain   . BPH with obstruction/lower urinary tract symptoms   . Chronic kidney disease    "they said I do"  . Constipation   . Depression   . ED (erectile dysfunction)   . Epigastric pain   . Heartburn   . History of blood transfusion   . History of radiation therapy 11/03/11-12/29/11   prostate  . Hypercholesterolemia   . Hypertension   . Night sweats   . Prostate cancer ([place]) 08/14/11   Adenocarcinoma,gleason:3+3=6,&  3+4=7,PSA=5.66  . PUD (peptic ulcer disease)   . Stomach cancer ([place])   . Ulcer ([place])    peptic ulcer hx    Past Surgical History:  Procedure Laterality Date  . colon polyps bx  11/24/06   colon,transverse and rectosigmoid polyps:tubular adenomas and hyperplastic polyps,no high grade dysplasia or malignancy   . duodenal bx  11/24/06   benign  . ESOPHAGOGASTRODUODENOSCOPY N/A 10/27/2015   Procedure: ESOPHAGOGASTRODUODENOSCOPY (EGD);  Surgeon: [name], MD;  Location: [facility];  Service: Endoscopy;  Laterality: N/A;  . EUS N/A 11/08/2015   Procedure: UPPER ENDOSCOPIC ULTRASOUND (EUS) RADIAL;  Surgeon: [name], MD;  Location: [facility];  Service: Endoscopy;  Laterality: N/A;  . gastric bx  11/24/06   chronic active gastritis,with metaplasia and focal changaes of xanthelasma  . INSERTION PROSTATE RADIATION SEED  12-29-11  . PROSTATE BIOPSY  08/14/11   Adenocarcinoma/volume=58.51cc,gleason=3+3=6 & 3+4=7  . TONSILLECTOMY     70 years old    Social History   Social History  . Marital status: Married    Spouse name: [name]  . Number of children: 3  . Years of education: 12   Occupational History  . retired Public house manager   Social History Main Topics  . Smoking status: Former Smoker    Packs/day: 1.50    Years: 30.00  Types: Cigarettes    Quit date: 04/13/2000  . Smokeless tobacco: Never Used  . Alcohol use 0.6 oz/week    1 Cans of beer per week     Comment: 1-2 beers every other night  . Drug use: No  . Sexual activity: Not Currently   Other Topics Concern  . None   Social History Narrative   Patient is married Loss adjuster, chartered) and lives at home with his wife and one child.   Patient has three children.   Patient is retired.   Patient has a high school education.   Patient is ambi-dextrous.   Patient drinks two cups of coffee about three times a week.      FAMILY HISTORY:  We obtained a detailed, 4-generation family history.  Significant diagnoses  are listed below: Family History  Problem Relation Age of Onset  . Cancer Mother     NOS  . Alcohol abuse Father   . Alcohol abuse Brother 50  . Cancer Paternal Aunt     NOS  . Colon cancer Neg Hx     The patient has three sons who are cancer free. He has four full brothers and a full sister, and a maternal half brother who are cancer free.  His mother may have died from cancer.  She had a brother and sister, and th patient is not aware of them having cancer.  His father died of a heart attack.  He had four brothers and two sisters.  One sister may have had cancer.  Patient's maternal ancestors are of African American descent, and paternal ancestors are of African American descent. There is no reported Ashkenazi Jewish ancestry. There Is no known consanguinity.  GENETIC COUNSELING ASSESSMENT: Jacob [name] is a 70 y.o. male with a personal history of multiple colon polyps, and prostate and gastric cancer which is somewhat suggestive of a hereditary cancer syndromes and predisposition to cancer. We, therefore, discussed and recommended the following at today's visit.   DISCUSSION: We discussed that about 5-10% of prostate cancer and about 3-5% of gastric cancer is due to hereditary cancer syndromes.  When we see multiple colon polyps and gastric cancer we lean toward Lynch syndrome.  Prostate cancer can also be seen in Lynch syndrome.  There are other hereditary colon cancer syndromes that are associated with other genes.  We reviewed the characteristics, features and inheritance patterns of hereditary cancer syndromes. We also discussed genetic testing, including the appropriate family members to test, the process of testing, insurance coverage and turn-around-time for results. We discussed the implications of a negative, positive and/or variant of uncertain significant result. We recommended Jacob [name] pursue genetic testing for the colorectal cancer gene panel. The Colorectal Cancer Panel  offered by [facility] includes sequencing and/or duplication/deletion testing of the following 19 genes: APC, ATM, AXIN2, BMPR1A, CDH1, CHEK2, EPCAM, MLH1, MSH2, MSH6, MUTYH, PMS2, POLD1, POLE, PTEN, SCG5/GREM1, SMAD4, STK11, and TP53.   Based on Jacob [name] personal and family history of cancer, he meets medical criteria for genetic testing. Despite that he meets criteria, he may still have an out of pocket cost. We discussed that if his out of pocket cost for testing is over $100, the laboratory will call and confirm whether he wants to proceed with testing.  If the out of pocket cost of testing is less than $100 he will be billed by the genetic testing laboratory.   PLAN: After considering the risks, benefits, and limitations, Jacob [name]  provided informed consent to pursue  genetic testing and the blood sample was sent to [facility] for analysis of the colorectal cancer panel. Results should be available within approximately 2-3 weeks' time, at which point they will be disclosed by telephone to Jacob [name], as will any additional recommendations warranted by these results. Jacob [name] will receive a summary of his genetic counseling visit and a copy of his results once available. This information will also be available in Epic. We encouraged Jacob [name] to remain in contact with cancer genetics annually so that we can continuously update the family history and inform him of any changes in cancer genetics and testing that may be of benefit for his family. Jacob [name] questions were answered to his satisfaction today. Our contact information was provided should additional questions or concerns arise.  Lastly, we encouraged Jacob [name] to remain in contact with cancer genetics annually so that we can continuously update the family history and inform him of any changes in cancer genetics and testing that may be of benefit for this family.   Mr.  [name] questions were  answered to his satisfaction today. Our contact information was provided should additional questions or concerns arise. Thank you for the referral and allowing [place] to share in the care of your patient.   Jacob [name] P. Florene Glen, [place], [facility] Certified Genetic Counselor [email or web address] phone: [phone]  The patient was seen for a total of 45 minutes in face-to-face genetic counseling.  This patient was discussed with Drs. Magrinat, Lindi Adie and/or Burr Medico who agrees with the above.    _______________________________________________________________________ For Office Staff:  Number of people involved in session: 1 Was an Intern/ student involved with case: no

## 2015-11-28 NOTE — Progress Notes (Signed)
70 year old male diagnosed with gastric cancer in October 2017.  He is a patient of Dr. Alen Blew.  Past medical history includes prostate cancer in 2013, PUD, renal insufficiency, hypertension, radiation therapy, depression, and anxiety.  Medications include Wellbutrin, Tums, Lasix, Prilosec, and Compazine.  Labs include potassium 5.3, BUN 28, creatinine 2.09, and albumin 2.9.  Height: 6 feet 2 inches. Weight: 260.9 pounds on November 9. Usual body weight: 280 pounds June 2017. BMI: 33.5.  Patient endorses a 20 pound weight loss since June 2017. He has abdominal pain. Reports he typically does not consume breakfast but eats lunch and dinner and snacks on foods like cake chips. Patient denies nausea and vomiting but reports occasional constipation/diarrhea. Reports he drinks one Glucerna every day because this is the supplement his wife consumes  Nutrition diagnosis:  Unintentional weight loss related to gastric cancer as evidenced by 20 pound weight loss in 5 months.  Intervention: Educated patient to consume 3 small meals a day with snacks in between. Provided fact sheets on increasing calories and protein.  Encouraged patient to consume Ensure Plus once daily. Provided samples and coupons. Educated patient to consume increased water. Questions were answered.  Teach back method used.  Fact sheets were provided and contact information was given.  Monitoring, evaluation, goals:  Patient will tolerate increased calories and protein to promote weight maintenance.  Next visit: Wednesday, December 13, during infusion.  **Disclaimer: This note was dictated with voice recognition software. Similar sounding words can inadvertently be transcribed and this note may contain transcription errors which may not have been corrected upon publication of note.**

## 2015-11-29 ENCOUNTER — Other Ambulatory Visit: Payer: Self-pay | Admitting: General Surgery

## 2015-12-03 NOTE — Progress Notes (Signed)
Informed patient to follow preop instructions dated 11/26/2015. Verbalized understanding and reports he still had instructions. Patient reports that he had CHG at home requested he [place] it tonight and in the morning before surgery

## 2015-12-04 ENCOUNTER — Ambulatory Visit (HOSPITAL_COMMUNITY): Payer: Medicare Other | Admitting: Certified Registered Nurse Anesthetist

## 2015-12-04 ENCOUNTER — Encounter (HOSPITAL_COMMUNITY): Payer: Self-pay | Admitting: *Deleted

## 2015-12-04 ENCOUNTER — Ambulatory Visit (HOSPITAL_COMMUNITY): Payer: Medicare Other

## 2015-12-04 ENCOUNTER — Ambulatory Visit (HOSPITAL_COMMUNITY)
Admission: RE | Admit: 2015-12-04 | Discharge: 2015-12-04 | Disposition: A | Discharge status: Home / Self Care | Payer: Medicare Other | Source: Ambulatory Visit | Attending: General Surgery | Admitting: General Surgery

## 2015-12-04 ENCOUNTER — Encounter (HOSPITAL_COMMUNITY)
Admission: RE | Disposition: A | Discharge status: Home / Self Care | Payer: Self-pay | Source: Ambulatory Visit | Attending: General Surgery

## 2015-12-04 DIAGNOSIS — E440 Moderate protein-calorie malnutrition: Secondary | ICD-10-CM | POA: Insufficient documentation

## 2015-12-04 DIAGNOSIS — Z8546 Personal history of malignant neoplasm of prostate: Secondary | ICD-10-CM | POA: Diagnosis not present

## 2015-12-04 DIAGNOSIS — I2510 Atherosclerotic heart disease of native coronary artery without angina pectoris: Secondary | ICD-10-CM | POA: Diagnosis not present

## 2015-12-04 DIAGNOSIS — I700 Atherosclerosis of aorta: Secondary | ICD-10-CM | POA: Diagnosis not present

## 2015-12-04 DIAGNOSIS — C169 Malignant neoplasm of stomach, unspecified: Secondary | ICD-10-CM | POA: Diagnosis present

## 2015-12-04 DIAGNOSIS — D500 Iron deficiency anemia secondary to blood loss (chronic): Secondary | ICD-10-CM | POA: Diagnosis not present

## 2015-12-04 DIAGNOSIS — Z452 Encounter for adjustment and management of vascular access device: Secondary | ICD-10-CM | POA: Insufficient documentation

## 2015-12-04 DIAGNOSIS — N189 Chronic kidney disease, unspecified: Secondary | ICD-10-CM | POA: Insufficient documentation

## 2015-12-04 DIAGNOSIS — I129 Hypertensive chronic kidney disease with stage 1 through stage 4 chronic kidney disease, or unspecified chronic kidney disease: Secondary | ICD-10-CM | POA: Diagnosis not present

## 2015-12-04 DIAGNOSIS — I723 Aneurysm of iliac artery: Secondary | ICD-10-CM | POA: Insufficient documentation

## 2015-12-04 DIAGNOSIS — Z95828 Presence of other vascular implants and grafts: Secondary | ICD-10-CM

## 2015-12-04 DIAGNOSIS — Z419 Encounter for procedure for purposes other than remedying health state, unspecified: Secondary | ICD-10-CM

## 2015-12-04 HISTORY — PX: PORTACATH PLACEMENT: SHX2246

## 2015-12-04 LAB — BASIC METABOLIC PANEL
ANION GAP: 5 (ref 5–15)
BUN: 23 mg/dL — AB (ref 6–20)
CALCIUM: 9.1 mg/dL (ref 8.9–10.3)
CO2: 22 mmol/L (ref 22–32)
CREATININE: 1.79 mg/dL — AB (ref 0.61–1.24)
Chloride: 109 mmol/L (ref 101–111)
GFR calc Af Amer: 43 mL/min — ABNORMAL LOW (ref >60)
GFR, EST NON AFRICAN AMERICAN: 37 mL/min — AB (ref >60)
GLUCOSE: 89 mg/dL (ref 65–99)
Potassium: 4.2 mmol/L (ref 3.5–5.1)
Sodium: 136 mmol/L (ref 135–145)

## 2015-12-04 LAB — CBC WITH DIFFERENTIAL/PLATELET
BASOS ABS: 0 10*3/uL (ref 0.0–0.1)
BASOS PCT: 0 %
EOS ABS: 0.2 10*3/uL (ref 0.0–0.7)
EOS PCT: 3 %
HCT: 24.7 % — ABNORMAL LOW (ref 39.0–52.0)
Hemoglobin: 7.8 g/dL — ABNORMAL LOW (ref 13.0–17.0)
Lymphocytes Relative: 10 %
Lymphs Abs: 0.7 10*3/uL (ref 0.7–4.0)
MCH: 29 pg (ref 26.0–34.0)
MCHC: 31.6 g/dL (ref 30.0–36.0)
MCV: 91.8 fL (ref 78.0–100.0)
MONO ABS: 0.8 10*3/uL (ref 0.1–1.0)
MONOS PCT: 11 %
NEUTROS ABS: 5.5 10*3/uL (ref 1.7–7.7)
Neutrophils Relative %: 76 %
PLATELETS: 220 10*3/uL (ref 150–400)
RBC: 2.69 MIL/uL — ABNORMAL LOW (ref 4.22–5.81)
RDW: 16.6 % — AB (ref 11.5–15.5)
WBC: 7.2 10*3/uL (ref 4.0–10.5)

## 2015-12-04 IMAGING — CR DG CHEST 1V PORT
1 series · 1 of 1 positions shown · non-contrast
Comparison: [DATE]

CLINICAL DATA: Port-A-Cath

EXAM:
PORTABLE CHEST 1 VIEW

[portable]
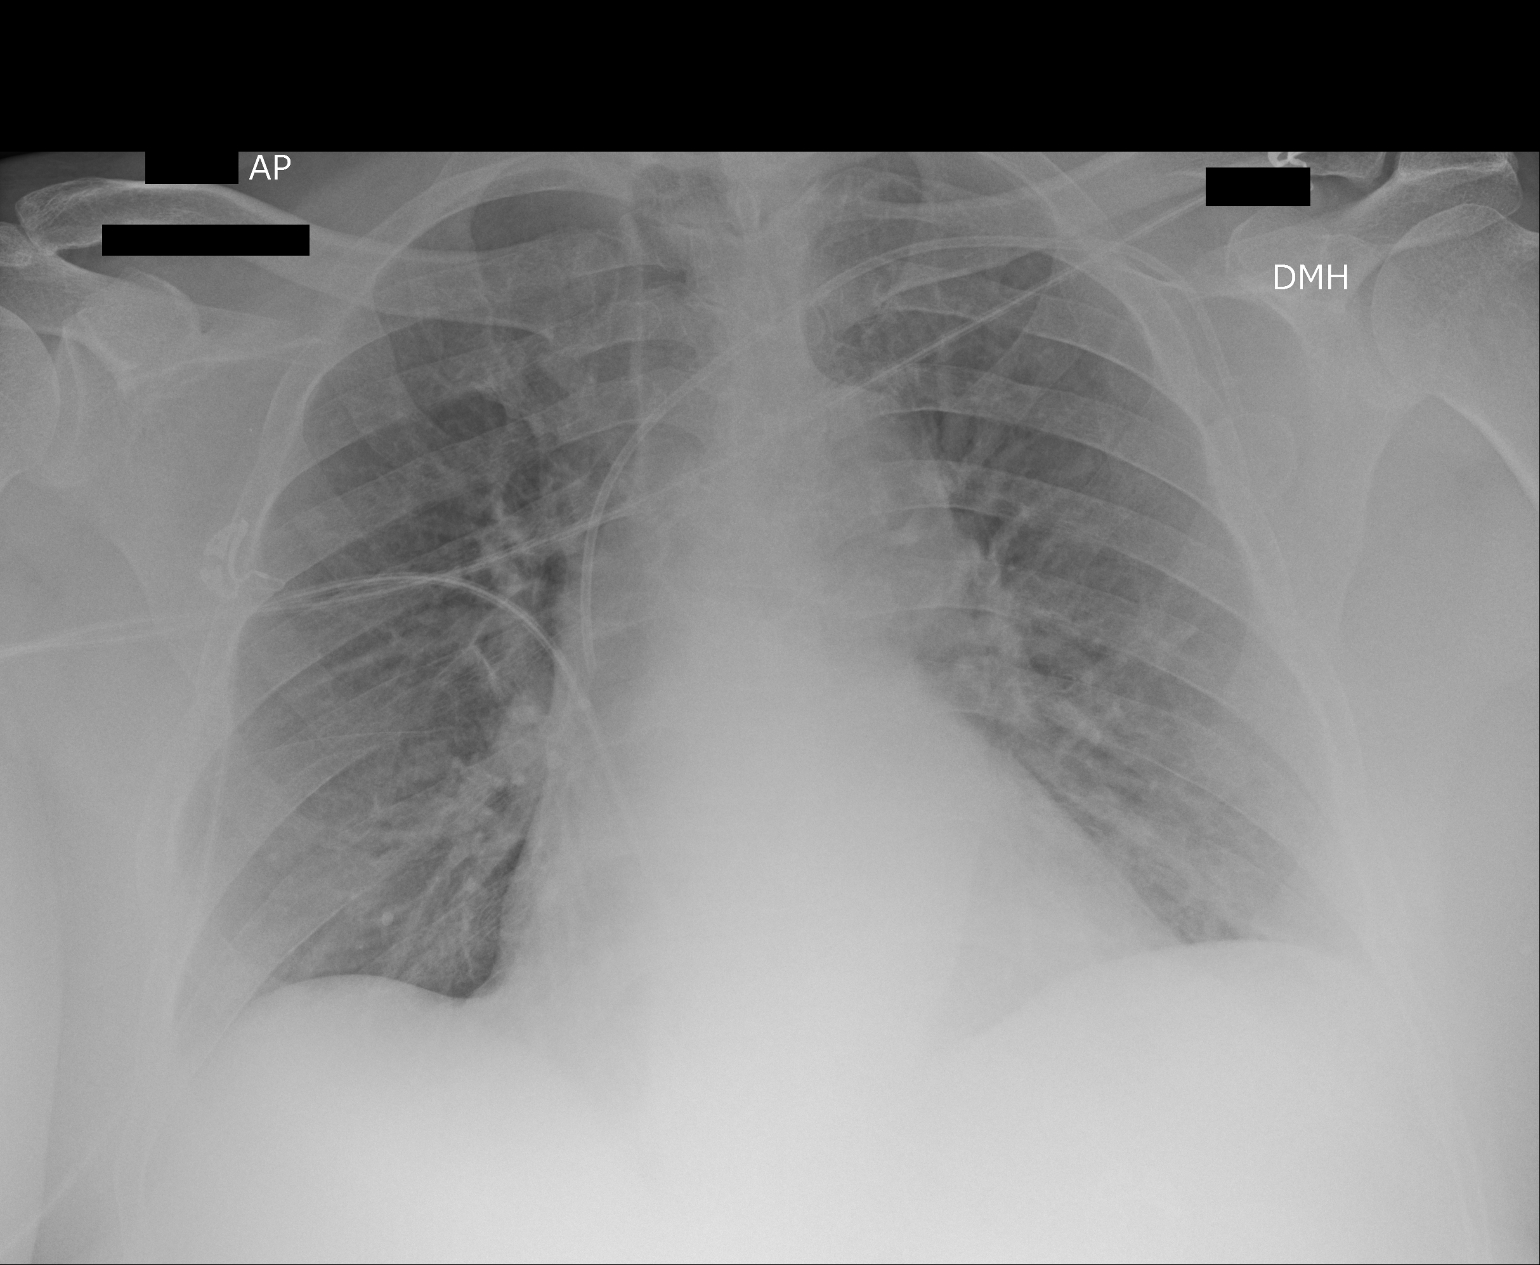

[1 of 1 positions shown; findings below may reference images not displayed]

FINDINGS: A left subclavian vein Port-A-Cath has been placed. The tip is in
the distal SVC. There is no pneumothorax. The heart is moderately
enlarged. Normal vascularity. No consolidation or mass.
IMPRESSION: Left subclavian vein Port-A-Cath placement with its tip in the SVC
and no pneumothorax.

## 2015-12-04 SURGERY — INSERTION, TUNNELED CENTRAL VENOUS DEVICE, WITH PORT
Anesthesia: General | Site: Chest | Laterality: Left

## 2015-12-04 MED ORDER — LIDOCAINE HCL 1 % IJ SOLN
INTRAMUSCULAR | Status: DC | PRN
  Administered 2015-12-04: 30 mL

## 2015-12-04 MED ORDER — SODIUM CHLORIDE 0.9 % IV SOLN
INTRAVENOUS | Status: DC
  Administered 2015-12-04: 07:00:00 via INTRAVENOUS

## 2015-12-04 MED ORDER — FENTANYL CITRATE (PF) 100 MCG/2ML IJ SOLN: 25.0000 ug | INTRAMUSCULAR | Status: DC | PRN

## 2015-12-04 MED ORDER — ROCURONIUM BROMIDE 10 MG/ML (PF) SYRINGE
PREFILLED_SYRINGE | INTRAVENOUS | Status: AC
Start: 2015-12-04 — End: 2015-12-04
  Filled 2015-12-04: qty 10

## 2015-12-04 MED ORDER — SODIUM CHLORIDE 0.9% FLUSH: 3.0000 mL | INTRAVENOUS | Status: DC | PRN

## 2015-12-04 MED ORDER — LIDOCAINE 2% (20 MG/ML) 5 ML SYRINGE
INTRAMUSCULAR | Status: DC | PRN
  Administered 2015-12-04: 100 mg via INTRAVENOUS

## 2015-12-04 MED ORDER — OXYCODONE HCL 5 MG/5ML PO SOLN: 5.0000 mg | Freq: Once | ORAL | Status: DC | PRN

## 2015-12-04 MED ORDER — CHLORHEXIDINE GLUCONATE CLOTH 2 % EX PADS: 6.0000 | MEDICATED_PAD | Freq: Once | CUTANEOUS | Status: DC

## 2015-12-04 MED ORDER — PROPOFOL 10 MG/ML IV BOLUS
INTRAVENOUS | Status: AC
  Filled 2015-12-04: qty 20

## 2015-12-04 MED ORDER — CEFAZOLIN SODIUM-DEXTROSE 2-4 GM/100ML-% IV SOLN
2.0000 g | INTRAVENOUS | Status: AC
  Administered 2015-12-04: 2 g via INTRAVENOUS
  Filled 2015-12-04: qty 100

## 2015-12-04 MED ORDER — ACETAMINOPHEN 650 MG RE SUPP: 650.0000 mg | RECTAL | Status: DC | PRN

## 2015-12-04 MED ORDER — OXYCODONE HCL 5 MG PO TABS: 5.0000 mg | ORAL_TABLET | ORAL | Status: DC | PRN

## 2015-12-04 MED ORDER — SODIUM CHLORIDE 0.9 % IV SOLN: 250.0000 mL | INTRAVENOUS | Status: DC | PRN

## 2015-12-04 MED ORDER — ONDANSETRON HCL 4 MG/2ML IJ SOLN
INTRAMUSCULAR | Status: AC
Start: 2015-12-04 — End: 2015-12-04
  Filled 2015-12-04: qty 2

## 2015-12-04 MED ORDER — EPHEDRINE SULFATE-NACL 50-0.9 MG/10ML-% IV SOSY
PREFILLED_SYRINGE | INTRAVENOUS | Status: DC | PRN
  Administered 2015-12-04 (×2): 5 mg via INTRAVENOUS

## 2015-12-04 MED ORDER — 0.9 % SODIUM CHLORIDE (POUR BTL) OPTIME
TOPICAL | Status: DC | PRN
  Administered 2015-12-04: 1000 mL

## 2015-12-04 MED ORDER — LIDOCAINE 2% (20 MG/ML) 5 ML SYRINGE
INTRAMUSCULAR | Status: AC
  Filled 2015-12-04: qty 5

## 2015-12-04 MED ORDER — FENTANYL CITRATE (PF) 100 MCG/2ML IJ SOLN
INTRAMUSCULAR | Status: DC | PRN
  Administered 2015-12-04: 100 ug via INTRAVENOUS

## 2015-12-04 MED ORDER — OXYCODONE HCL 5 MG PO TABS: 5.0000 mg | ORAL_TABLET | Freq: Once | ORAL | Status: DC | PRN

## 2015-12-04 MED ORDER — DEXAMETHASONE SODIUM PHOSPHATE 10 MG/ML IJ SOLN
INTRAMUSCULAR | Status: AC
  Filled 2015-12-04: qty 1

## 2015-12-04 MED ORDER — SODIUM CHLORIDE 0.9% FLUSH: 3.0000 mL | Freq: Two times a day (BID) | INTRAVENOUS | Status: DC

## 2015-12-04 MED ORDER — BUPIVACAINE HCL (PF) 0.5 % IJ SOLN
INTRAMUSCULAR | Status: DC | PRN
  Administered 2015-12-04: 30 mL

## 2015-12-04 MED ORDER — ACETAMINOPHEN 325 MG PO TABS: 650.0000 mg | ORAL_TABLET | ORAL | Status: DC | PRN

## 2015-12-04 MED ORDER — LIDOCAINE HCL (PF) 1 % IJ SOLN
INTRAMUSCULAR | Status: AC
Start: 2015-12-04 — End: 2015-12-04
  Filled 2015-12-04: qty 30

## 2015-12-04 MED ORDER — SODIUM CHLORIDE 0.9 % IV SOLN
INTRAVENOUS | Status: DC | PRN
  Administered 2015-12-04: 08:00:00

## 2015-12-04 MED ORDER — EPHEDRINE 5 MG/ML INJ
INTRAVENOUS | Status: AC
  Filled 2015-12-04: qty 10

## 2015-12-04 MED ORDER — HEPARIN SOD (PORK) LOCK FLUSH 100 UNIT/ML IV SOLN
INTRAVENOUS | Status: DC | PRN
  Administered 2015-12-04: 500 [IU]

## 2015-12-04 MED ORDER — PROPOFOL 10 MG/ML IV BOLUS
INTRAVENOUS | Status: DC | PRN
  Administered 2015-12-04: 200 mg via INTRAVENOUS

## 2015-12-04 MED ORDER — DEXAMETHASONE SODIUM PHOSPHATE 10 MG/ML IJ SOLN
INTRAMUSCULAR | Status: DC | PRN
  Administered 2015-12-04: 10 mg via INTRAVENOUS

## 2015-12-04 MED ORDER — HEPARIN SOD (PORK) LOCK FLUSH 100 UNIT/ML IV SOLN
INTRAVENOUS | Status: AC
  Filled 2015-12-04: qty 5

## 2015-12-04 MED ORDER — OXYCODONE HCL 5 MG PO TABS: 5.0000 mg | ORAL_TABLET | Freq: Four times a day (QID) | ORAL | 0 refills | Status: DC | PRN

## 2015-12-04 MED ORDER — ONDANSETRON HCL 4 MG/2ML IJ SOLN
INTRAMUSCULAR | Status: DC | PRN
  Administered 2015-12-04: 4 mg via INTRAVENOUS

## 2015-12-04 MED ORDER — FENTANYL CITRATE (PF) 250 MCG/5ML IJ SOLN
INTRAMUSCULAR | Status: AC
  Filled 2015-12-04: qty 5

## 2015-12-04 MED ORDER — BUPIVACAINE HCL (PF) 0.5 % IJ SOLN
INTRAMUSCULAR | Status: AC
  Filled 2015-12-04: qty 30

## 2015-12-04 SURGICAL SUPPLY — 53 items
ADH SKN CLS APL DERMABOND .7 (GAUZE/BANDAGES/DRESSINGS) ×1
BAG DECANTER FOR FLEXI CONT (MISCELLANEOUS) ×2 IMPLANT
BLADE SURG 11 STRL SS (BLADE) ×2 IMPLANT
BLADE SURG 15 STRL LF DISP TIS (BLADE) ×1 IMPLANT
BLADE SURG 15 STRL SS (BLADE)
CANISTER SUCTION 2500CC (MISCELLANEOUS) IMPLANT
CHLORAPREP W/TINT 10.5 ML (MISCELLANEOUS) ×2 IMPLANT
COVER SURGICAL LIGHT HANDLE (MISCELLANEOUS) ×2 IMPLANT
COVER TRANSDUCER ULTRASND GEL (DRAPE) IMPLANT
CRADLE DONUT ADULT HEAD (MISCELLANEOUS) ×2 IMPLANT
DECANTER SPIKE VIAL GLASS SM (MISCELLANEOUS) ×4 IMPLANT
DERMABOND ADVANCED (GAUZE/BANDAGES/DRESSINGS) ×1
DERMABOND ADVANCED .7 DNX12 (GAUZE/BANDAGES/DRESSINGS) ×1 IMPLANT
DRAPE C-ARM 42X72 X-RAY (DRAPES) ×2 IMPLANT
DRAPE CHEST BREAST 15X10 FENES (DRAPES) ×1 IMPLANT
DRAPE UTILITY XL STRL (DRAPES) ×3 IMPLANT
DRAPE WARM FLUID 44X44 (DRAPE) IMPLANT
ELECT COATED BLADE 2.86 ST (ELECTRODE) ×2 IMPLANT
ELECT REM PT RETURN 9FT ADLT (ELECTROSURGICAL) ×2
ELECTRODE REM PT RTRN 9FT ADLT (ELECTROSURGICAL) ×1 IMPLANT
GAUZE SPONGE 4X4 16PLY XRAY LF (GAUZE/BANDAGES/DRESSINGS) ×2 IMPLANT
GEL ULTRASOUND 20GR AQUASONIC (MISCELLANEOUS) IMPLANT
GLOVE BIO SURGEON STRL SZ 6 (GLOVE) ×2 IMPLANT
GLOVE BIO SURGEON STRL SZ7 (GLOVE) ×2 IMPLANT
GLOVE BIOGEL PI IND STRL 6.5 (GLOVE) ×1 IMPLANT
GLOVE BIOGEL PI IND STRL 8 (GLOVE) IMPLANT
GLOVE BIOGEL PI INDICATOR 6.5 (GLOVE) ×1
GLOVE BIOGEL PI INDICATOR 8 (GLOVE) ×1
GLOVE INDICATOR 7.0 STRL GRN (GLOVE) ×4 IMPLANT
GOWN STRL REUS W/ TWL LRG LVL3 (GOWN DISPOSABLE) ×1 IMPLANT
GOWN STRL REUS W/TWL 2XL LVL3 (GOWN DISPOSABLE) ×2 IMPLANT
GOWN STRL REUS W/TWL LRG LVL3 (GOWN DISPOSABLE) ×4
KIT BASIN OR (CUSTOM PROCEDURE TRAY) ×2 IMPLANT
KIT PORT POWER 8FR ISP CVUE (Catheter) ×1 IMPLANT
KIT ROOM TURNOVER OR (KITS) ×2 IMPLANT
NDL HYPO 25GX1X1/2 BEV (NEEDLE) ×1 IMPLANT
NEEDLE HYPO 25GX1X1/2 BEV (NEEDLE) ×2 IMPLANT
NS IRRIG 1000ML POUR BTL (IV SOLUTION) ×2 IMPLANT
PACK GENERAL/GYN (CUSTOM PROCEDURE TRAY) ×1 IMPLANT
PACK SURGICAL SETUP 50X90 (CUSTOM PROCEDURE TRAY) ×1 IMPLANT
PAD ARMBOARD 7.5X6 YLW CONV (MISCELLANEOUS) ×2 IMPLANT
PENCIL BUTTON HOLSTER BLD 10FT (ELECTRODE) ×1 IMPLANT
SUT MON AB 4-0 PC3 18 (SUTURE) ×2 IMPLANT
SUT PROLENE 2 0 SH DA (SUTURE) ×4 IMPLANT
SUT VIC AB 3-0 SH 27 (SUTURE) ×2
SUT VIC AB 3-0 SH 27X BRD (SUTURE) ×1 IMPLANT
SYR 20ML ECCENTRIC (SYRINGE) ×4 IMPLANT
SYR 5ML LUER SLIP (SYRINGE) ×2 IMPLANT
SYR CONTROL 10ML LL (SYRINGE) ×2 IMPLANT
TOWEL OR 17X24 6PK STRL BLUE (TOWEL DISPOSABLE) ×2 IMPLANT
TOWEL OR 17X26 10 PK STRL BLUE (TOWEL DISPOSABLE) ×1 IMPLANT
TUBE CONNECTING 12X1/4 (SUCTIONS) IMPLANT
YANKAUER SUCT BULB TIP NO VENT (SUCTIONS) IMPLANT

## 2015-12-04 NOTE — Anesthesia Procedure Notes (Signed)
Procedure Name: LMA Insertion Date/Time: 12/04/2015 8:44 AM Performed by: Noralyn Pick D Pre-anesthesia Checklist: Patient identified, Emergency Drugs available, Suction available and Patient being monitored Patient Re-evaluated:Patient Re-evaluated prior to inductionOxygen Delivery Method: Circle system utilized Preoxygenation: Pre-oxygenation with 100% oxygen Intubation Type: IV induction Ventilation: Mask ventilation without difficulty LMA Size: 5.0 Tube type: Oral Number of attempts: 1 Placement Confirmation: ETT inserted through vocal cords under direct vision,  positive ETCO2 and breath sounds checked- equal and bilateral Tube secured with: Tape Dental Injury: Teeth and Oropharynx as per pre-operative assessment

## 2015-12-04 NOTE — Interval H&P Note (Signed)
History and Physical Interval Note:  12/04/2015 8:24 AM  Jacob [name]  has presented today for surgery, with the diagnosis of GASTRIC CANCER  The various methods of treatment have been discussed with the patient and family. After consideration of risks, benefits and other options for treatment, the patient has consented to  Procedure(s): INSERTION PORT-A-CATH (N/A) as a surgical intervention .  The patient's history has been reviewed, patient examined, no change in status, stable for surgery.  I have reviewed the patient's chart and labs.  Questions were answered to the patient's satisfaction.     Kashina Mecum

## 2015-12-04 NOTE — Op Note (Signed)
PREOPERATIVE DIAGNOSIS:  Gastric cancer     POSTOPERATIVE DIAGNOSIS:  Same     PROCEDURE: Left subclavian port placement, Bard ClearVue  Power Port, MRI safe, 8-French.      SURGEON:  [name], MD      ANESTHESIA:  General and local   FINDINGS:  Good venous return, easy flush, and tip of the catheter and   SVC 27 cm.      SPECIMEN:  None.      ESTIMATED BLOOD LOSS:  Minimal.      COMPLICATIONS:  None known.      PROCEDURE:  Pt was identified in the holding area and taken to   the operating room, where patient was placed supine on the operating room   table.  General anesthesia was induced.  Patient's arms were tucked and the upper   chest and neck were prepped and draped in sterile fashion.  Time-out was   performed according to the surgical safety check list.  When all was   correct, we continued.   Local anesthetic was administered over this   area at the angle of the clavicle.  The vein was accessed with 2 passes of the needle. There was good venous return and the wire passed easily with no ectopy.   Fluoroscopy was used to confirm that the wire was in the vena cava.      The patient was placed back level and the area for the pocket was anethetized   with local anesthetic.  A 3-cm transverse incision was made with a #15   blade.  Cautery was used to divide the subcutaneous tissues down to the   pectoralis muscle.  An Army-Navy retractor was used to elevate the skin   while a pocket was created on top of the pectoralis fascia.  The port   was placed into the pocket to confirm that it was of adequate size.  The   catheter was preattached to the port.  The port was then secured to the   pectoralis fascia with four 2-0 Prolene sutures.  These were clamped and   not tied down yet.    The catheter was tunneled through to the wire exit   site.  The catheter was placed along the wire to determine what length it should be to be in the SVC.  The catheter was cut at 27 cm.  The  tunneler sheath and dilator were passed over the wire and the dilator and wire were removed.  The catheter was advanced through the tunneler sheath and the tunneler sheath was pulled away.  Care was taken to keep the catheter in the tunneler sheath as this occurred. This was advanced and the tunneler sheath was removed.  There was good venous   return and easy flush of the catheter.  The Prolene sutures were tied   down to the pectoral fascia.  The skin was reapproximated using 3-0   Vicryl interrupted deep dermal sutures.    Fluoroscopy was used to re-confirm good position of the catheter.  The skin   was then closed using 4-0 Monocryl in a subcuticular fashion.  The port was flushed with concentrated heparin flush as well.  The wounds were then cleaned, dried, and dressed with Dermabond.  The patient was awakened from anesthesia and taken to the PACU in stable condition.  Needle, sponge, and instrument counts were correct.               [name], MD

## 2015-12-04 NOTE — Anesthesia Postprocedure Evaluation (Addendum)
Anesthesia Post Note  Patient: Jacob [name]  Procedure(s) Performed: Procedure(s) (LRB): INSERTION PORT-A-CATH (Left)  Patient location during evaluation: PACU Anesthesia Type: General Level of consciousness: awake Pain management: pain level controlled Vital Signs Assessment: post-procedure vital signs reviewed and stable Respiratory status: spontaneous breathing Cardiovascular status: stable Postop Assessment: no signs of nausea or vomiting Anesthetic complications: no    Last Vitals:  Vitals:   12/04/15 1043 12/04/15 1050  BP: 131/78 128/81  Pulse: 65 70  Resp: 15 16  Temp:      Last Pain:  Vitals:   12/04/15 0943  TempSrc:   PainSc: 0-No pain                 Jahnessa Vanduyn

## 2015-12-04 NOTE — Discharge Instructions (Addendum)
[facility] °Office Phone Number [phone] ° ° POST OP INSTRUCTIONS ° °Always review your discharge instruction sheet given to you by the facility where your surgery was performed. ° °IF YOU HAVE DISABILITY OR FAMILY LEAVE FORMS, YOU MUST BRING THEM TO THE OFFICE FOR PROCESSING.  DO NOT GIVE THEM TO YOUR DOCTOR. ° °1. A prescription for pain medication may be given to you upon discharge.  Take your pain medication as prescribed, if needed.  If narcotic pain medicine is not needed, then you may take acetaminophen (Tylenol) or ibuprofen (Advil) as needed. °2. Take your usually prescribed medications unless otherwise directed °3. If you need a refill on your pain medication, please contact your pharmacy.  They will contact our office to request authorization.  Prescriptions will not be filled after 5pm or on week-ends. °4. You should eat very light the first 24 hours after surgery, such as soup, crackers, pudding, etc.  Resume your normal diet the day after surgery °5. It is common to experience some constipation if taking pain medication after surgery.  Increasing fluid intake and taking a stool softener will usually help or prevent this problem from occurring.  A mild laxative (Milk of Magnesia or Miralax) should be taken according to package directions if there are no bowel movements after 48 hours. °6. You may shower in 48 hours.  The surgical glue will flake off in 2-3 weeks.   °7. ACTIVITIES:  No strenuous activity or heavy lifting for 1 week.   °a. You may drive when you no longer are taking prescription pain medication, you can comfortably wear a seatbelt, and you can safely maneuver your car and apply brakes. °b. RETURN TO WORK:  __________to be determined._______________ °You should see your doctor in the office for a follow-up appointment approximately three-four weeks after your surgery.   ° °WHEN TO CALL YOUR DOCTOR: °1. Fever over 101.0 °2. Nausea and/or vomiting. °3. Extreme swelling  or bruising. °4. Continued bleeding from incision. °5. Increased pain, redness, or drainage from the incision. ° °The clinic staff is available to answer your questions during regular business hours.  Please don’t hesitate to call and ask to speak to one of the nurses for clinical concerns.  If you have a medical emergency, go to the nearest emergency room or call 911.  A surgeon from [facility] is always on call at the hospital. ° °For further questions, please visit [email or web address]  ° °

## 2015-12-04 NOTE — Anesthesia Preprocedure Evaluation (Addendum)
Anesthesia Evaluation  Patient identified by MRN, date of birth, ID band  Reviewed: Allergy & Precautions, NPO status , Patient's Chart, lab work & pertinent test results  History of Anesthesia Complications Negative for: history of anesthetic complications  Airway Mallampati: II  TM Distance: >3 FB Neck ROM: Full    Dental  (+) Dental Advisory Given   Pulmonary former smoker,    breath sounds clear to auscultation       Cardiovascular hypertension, Pt. on medications and Pt. on home beta blockers  Rhythm:Regular     Neuro/Psych PSYCHIATRIC DISORDERS Anxiety Depression negative neurological ROS     GI/Hepatic PUD,   Endo/Other    Renal/GU Renal InsufficiencyRenal disease     Musculoskeletal  (+) Arthritis ,   Abdominal   Peds  Hematology  (+) anemia ,   Anesthesia Other Findings   Reproductive/Obstetrics                            Anesthesia Physical Anesthesia Plan  ASA: III  Anesthesia Plan: General   Post-op Pain Management:    Induction: Intravenous  Airway Management Planned: LMA  Additional Equipment: None  Intra-op Plan:   Post-operative Plan: Extubation in OR  Informed Consent: I have reviewed the patients History and Physical, chart, labs and discussed the procedure including the risks, benefits and alternatives for the proposed anesthesia with the patient or authorized representative who has indicated his/her understanding and acceptance.   Dental advisory given  Plan Discussed with: CRNA, Anesthesiologist and Surgeon  Anesthesia Plan Comments:        Anesthesia Quick Evaluation

## 2015-12-04 NOTE — H&P (View-Only) (Signed)
Jacob [name] 11/16/2015 11:00 AM Location: [facility] Patient #: [id number] DOB: Feb 15, 1945 Undefined / Language: Jacob [name] / Race: Black or African American Male   History of Present Illness Jacob Klein MD; 11/16/2015 11:29 AM) The patient is a 70 year old male who presents with gastric cancer. This is a 70 year old male referred by Dr. Alen [name] with a new diagnosis of gastric cancer. He has been being followed for prostate cancer. That was diagnosed in 2013. He received radiation treatment as definitive therapy for that. He was seen to be anemic and early 2017 and had a bone marrow biopsy. 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 He got iron infusion in April and September. He was hospitalized on October 14 after presenting to the emergency department with melena. EGD was performed which showed a partially circumferential large antral mass. Biopsies were positive for adenocarcinoma. Endoscopic ultrasound was also performed which did not show any evidence of linitis plastica. It was felt to be an uT3N2 cancer.  The patient has noticed a decrease in his appetite over the last 3 months. He has lost somewhere between 15 and 20 pounds during that time period. He intermittently will have some diarrhea. He has been very strong for many years and has not had to have any significant medical involvement other than his prostate cancer until this year. He has started feeling weak and shaky over the past few weeks.  Other than his personal history of prostate cancer, the only cancer history he is aware of in his family is some type of stomach cancer in his mother. He does not know that it was in the organ of the stomach, but does know is was something in her abdomen.   Pathology Diagnosis Stomach, biopsy, antrum mass - ADENOCARCINOMA  EGD A large, fungating and ulcerated, partially circumferential (involving one-half of the lumen circumference) mass with no bleeding and  stigmata of recent bleeding was found in the gastric antrum and on the posterior wall of the gastric antrum. Biopsies were taken with a cold forceps for histology. Verification of patient identification for the specimen was done. Estimated blood loss was minimal.  EUS 1. The distal gastric mass above correlates with a hypoechoic and heterogenous lesion which measured 25 mm in thickness. The endosonographic borders were poorly-defined. There was sonographic evidence suggesting invasion into the serosa (Layer 5, uT3). 2. There were three small (3-75m) but suspicious perigastric lymphnodes near the mass. These are presumed to be positive for malignant involvement. (uN2) 3. No ascites. 4. Limited view of left lobe of liver, pancreas, spleen were normal (uMX) - uT3N2MX non-circumerfential, distal gastric adenocarcinoma which extends into the proximal duodenal bulb. The mass is 6-7cm in cranial caudal length and 2.5cm in maximal thickness. - EUS is not optimal for assessing malignant involvement of perigastric adenopathy, but the nodes that I saw are suspicious for malignant involvement.  PET IMPRESSION: 1. Markedly hypermetabolic mass in the gastric antrum with a hypermetabolic lymph node adjacent to the proximal duodenum, most indicative of malignancy. No distant metastatic disease. 2. Aortic atherosclerosis (ICD10-170.0). Coronary artery calcification. Bilateral common iliac artery aneurysms. 3. Soft tissue nodule in the anteromedial left chest wall does not exhibit malignant range hypermetabolism  CT C/A/P IMPRESSION: Circumferential wall thickening of the gastric antrum which is nonspecific and may be secondary to an infectious/inflammatory process or gastric malignancy/mass. Recommend correlation with upper endoscopy.  Nonspecific mass within the subcutaneous fat overlying the left chest wall. Recommend dedicated evaluation physical exam and ultrasound as  clinically  indicated.  Aortic atherosclerosis.  LABS:  BMET Cr 1.92, GFR 39 CBC HCT 27.3 CEA 100.9  Medications Allopurinol Wellbutrin XL Diazepam Lasix Gabapentin Xalatan eye drops Nebivolol prilosec flomax desyrel verapamil    Other Problems ( , MD; 11/16/2015 11:27 AM) Anxiety Disorder Back Pain Depression Enlarged Prostate Gastroesophageal Reflux Disease High blood pressure Home Oxygen Use Hypercholesterolemia Sleep Apnea  Past Surgical History ( , MD; 11/16/2015 11:27 AM) Tonsillectomy  Diagnostic Studies History ( , MD; 11/16/2015 11:27 AM) Colonoscopy 1-5 years ago  Social History ( , MD; 11/16/2015 11:27 AM) Alcohol use Moderate alcohol use. Caffeine use Carbonated beverages. Illicit drug use Remotely quit drug use. Tobacco use Former smoker.  Family History ( , MD; 11/16/2015 11:27 AM) Alcohol Abuse Father. Hypertension Brother, Sister. Malignant Neoplasm Of Pancreas Mother.    Review of Systems (  MD; 11/16/2015 11:27 AM) General Present- Night Sweats and Weight Loss. Not Present- Appetite Loss, Chills, Fatigue, Fever and Weight Gain. Skin Not Present- Change in Wart/Mole, Dryness, Hives, Jaundice, New Lesions, Non-Healing Wounds, Rash and Ulcer. HEENT Present- Wears glasses/contact lenses. Not Present- Earache, Hearing Loss, Hoarseness, Nose Bleed, Oral Ulcers, Ringing in the Ears, Seasonal Allergies, Sinus Pain, Sore Throat, Visual Disturbances and Yellow Eyes. Breast Not Present- Breast Mass, Breast Pain, Nipple Discharge and Skin Changes. Cardiovascular Not Present- Chest Pain, Difficulty Breathing Lying Down, Leg Cramps, Palpitations, Rapid Heart Rate, Shortness of Breath and Swelling of Extremities. Gastrointestinal Present- Chronic diarrhea and Indigestion. Not Present- Abdominal Pain, Bloating, Bloody Stool, Change in Bowel Habits, Constipation, Difficulty Swallowing,  Excessive gas, Gets full quickly at meals, Hemorrhoids, Nausea, Rectal Pain and Vomiting. Male Genitourinary Not Present- Blood in Urine, Change in Urinary Stream, Frequency, Impotence, Nocturia, Painful Urination, Urgency and Urine Leakage. Musculoskeletal Present- Back Pain. Not Present- Joint Pain, Joint Stiffness, Muscle Pain, Muscle Weakness and Swelling of Extremities. Neurological Present- Weakness. Not Present- Decreased Memory, Fainting, Headaches, Numbness, Seizures, Tingling, Tremor and Trouble walking. Psychiatric Present- Depression. Not Present- Anxiety, Bipolar, Change in Sleep Pattern, Fearful and Frequent crying. Endocrine Present- Excessive Hunger. Not Present- Cold Intolerance, Hair Changes, Heat Intolerance, Hot flashes and New Diabetes. Hematology Not Present- Blood Thinners, Easy Bruising, Excessive bleeding, Gland problems, HIV and Persistent Infections.  Vitals (  MD; 11/16/2015 11:08 AM) 11/16/2015 11:07 AM Weight: 264.2 lb Temp.: 99.02F  Pulse: 70 (Regular)  P.OX: 96% (Room air) BP: 118/63 (Sitting, Left Arm, Standard)       Physical Exam (  MD; 11/16/2015 11:30 AM) General Mental Status-Alert. General Appearance-Consistent with stated age. Hydration-Well hydrated. Voice-Normal.  Head and Neck Head-normocephalic, atraumatic with no lesions or palpable masses. Trachea-midline. Thyroid Gland Characteristics - normal size and consistency.  Eye Eyeball - Bilateral-Extraocular movements intact. Sclera/Conjunctiva - Bilateral-No scleral icterus.  Chest and Lung Exam Chest and lung exam reveals -quiet, even and easy respiratory effort with no use of accessory muscles and on auscultation, normal breath sounds, no adventitious sounds and normal vocal resonance. Inspection Chest Wall - Normal. Back - normal.  Cardiovascular Cardiovascular examination reveals -normal heart sounds, regular rate and rhythm with no  murmurs and normal pedal pulses bilaterally.  Abdomen Inspection Inspection of the abdomen reveals - Note: small umbilical hernia. Palpation/Percussion Palpation and Percussion of the abdomen reveal - Soft, Non Tender, No Rebound tenderness, No Rigidity (guarding) and No hepatosplenomegaly. Auscultation Auscultation of the abdomen reveals - Bowel sounds normal.  Neurologic Neurologic evaluation reveals -alert and oriented x 3 with no impairment of recent or remote memory. Mental Status-Normal.    Musculoskeletal Global Assessment -Note: no gross deformities.  Normal Exam - Left-Upper Extremity Strength Normal and Lower Extremity Strength Normal. Normal Exam - Right-Upper Extremity Strength Normal and Lower Extremity Strength Normal.  Lymphatic Head & Neck  General Head & Neck Lymphatics: Bilateral - Description - Normal. Axillary  General Axillary Region: Bilateral - Description - Normal. Tenderness - Non Tender. Femoral & Inguinal  Generalized Femoral & Inguinal Lymphatics: Bilateral - Description - No Generalized lymphadenopathy.    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 Because of the size of tumor in the presence of the small lymph nodes, I recommend neoadjuvant chemotherapy. We discussed him at our tumor board and he is going to receive FOLFOX. He has a borderline performance status and we are going to send him to physical therapy and nutrition to support his ability to receive chemotherapy. I will place a Port-A-Cath. Following chemotherapy, our plan would be to do diagnostic laparoscopy and probable distal gastrectomy. He will also receive restaging studies after chemotherapy. He has been strong for most of his life, I think the recent anemia and weight loss are the primary source of his decreased performance status. I suspect  that we can improve this by preoperative treatment. He will get a nutritional consult next week when he comes back to see Dr. Shadad. He is seeing physical therapy today. Patient is in agreement with the plan. His wife is present at this visit as well.  I reviewed the risks of surgery including bleeding, infection, pneumothorax, port malfunction, blood clot, possible need for additional procedures or surgeries.  60 min spent in evaluation, examination, counseling, and coordination of care. >50% spent in counseling. Current Plans Pt Education - ccs port insertion education Pt Education - Stomach Cancer (Gastric Cancer): cancer Referred to Genetic Counseling, for evaluation and follow up (Medical Genetics). Routine. 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 HISTORY OF PROSTATE CANCER (Z85.46) Impression: referral to genetic counseling with two primary cancers. CHRONIC KIDNEY DISEASE (N18.9) HYPERTENSION, ESSENTIAL (I10) Impression: appears well controlled.    Signed by  , MD (11/16/2015 11:38 AM)  

## 2015-12-04 NOTE — Transfer of Care (Signed)
Immediate Anesthesia Transfer of Care Note  Patient: Jacob [name]  Procedure(s) Performed: Procedure(s): INSERTION PORT-A-CATH (Left)  Patient Location: PACU  Anesthesia Type:General  Level of Consciousness: awake, alert  and oriented  Airway & Oxygen Therapy: Patient Spontanous Breathing and Patient connected to face mask oxygen  Post-op Assessment: Report given to RN and Post -op Vital signs reviewed and stable  Post vital signs: Reviewed and stable  Last Vitals:  Vitals:   12/04/15 0636  BP: (!) 145/77  Pulse: 77  Resp: 18  Temp: 36.7 C    Last Pain:  Vitals:   12/04/15 0636  TempSrc: Oral         Complications: No apparent anesthesia complications

## 2015-12-05 ENCOUNTER — Encounter (HOSPITAL_COMMUNITY): Payer: Self-pay | Admitting: General Surgery

## 2015-12-05 ENCOUNTER — Other Ambulatory Visit: Payer: Medicare Other

## 2015-12-05 ENCOUNTER — Ambulatory Visit: Payer: Medicare Other

## 2015-12-12 ENCOUNTER — Other Ambulatory Visit (HOSPITAL_BASED_OUTPATIENT_CLINIC_OR_DEPARTMENT_OTHER): Payer: Medicare Other

## 2015-12-12 ENCOUNTER — Encounter: Payer: Self-pay | Admitting: *Deleted

## 2015-12-12 ENCOUNTER — Ambulatory Visit (HOSPITAL_BASED_OUTPATIENT_CLINIC_OR_DEPARTMENT_OTHER): Payer: Medicare Other

## 2015-12-12 ENCOUNTER — Other Ambulatory Visit: Payer: Self-pay | Admitting: *Deleted

## 2015-12-12 ENCOUNTER — Telehealth: Payer: Self-pay | Admitting: Oncology

## 2015-12-12 ENCOUNTER — Encounter: Payer: Self-pay | Admitting: Oncology

## 2015-12-12 ENCOUNTER — Ambulatory Visit (HOSPITAL_BASED_OUTPATIENT_CLINIC_OR_DEPARTMENT_OTHER): Payer: Medicare Other | Admitting: Oncology

## 2015-12-12 VITALS — BP 122/63 | HR 77 | Temp 98.1°F | Resp 18 | Ht 74.0 in | Wt 268.1 lb

## 2015-12-12 DIAGNOSIS — C163 Malignant neoplasm of pyloric antrum: Secondary | ICD-10-CM

## 2015-12-12 DIAGNOSIS — D509 Iron deficiency anemia, unspecified: Secondary | ICD-10-CM | POA: Diagnosis not present

## 2015-12-12 DIAGNOSIS — Z8546 Personal history of malignant neoplasm of prostate: Secondary | ICD-10-CM

## 2015-12-12 DIAGNOSIS — D649 Anemia, unspecified: Secondary | ICD-10-CM

## 2015-12-12 DIAGNOSIS — D631 Anemia in chronic kidney disease: Secondary | ICD-10-CM

## 2015-12-12 DIAGNOSIS — C169 Malignant neoplasm of stomach, unspecified: Secondary | ICD-10-CM

## 2015-12-12 DIAGNOSIS — Z5111 Encounter for antineoplastic chemotherapy: Secondary | ICD-10-CM

## 2015-12-12 DIAGNOSIS — N289 Disorder of kidney and ureter, unspecified: Secondary | ICD-10-CM | POA: Diagnosis not present

## 2015-12-12 LAB — CBC WITH DIFFERENTIAL/PLATELET
BASO%: 0.7 % (ref 0.0–2.0)
Basophils Absolute: 0 10*3/uL (ref 0.0–0.1)
EOS ABS: 0.2 10*3/uL (ref 0.0–0.5)
EOS%: 3.1 % (ref 0.0–7.0)
HEMATOCRIT: 22.7 % — AB (ref 38.4–49.9)
HEMOGLOBIN: 7.3 g/dL — AB (ref 13.0–17.1)
LYMPH#: 0.7 10*3/uL — AB (ref 0.9–3.3)
LYMPH%: 9.4 % — ABNORMAL LOW (ref 14.0–49.0)
MCH: 29.6 pg (ref 27.2–33.4)
MCHC: 32 g/dL (ref 32.0–36.0)
MCV: 92.5 fL (ref 79.3–98.0)
MONO#: 0.7 10*3/uL (ref 0.1–0.9)
MONO%: 9.1 % (ref 0.0–14.0)
NEUT%: 77.7 % — ABNORMAL HIGH (ref 39.0–75.0)
NEUTROS ABS: 5.7 10*3/uL (ref 1.5–6.5)
Platelets: 216 10*3/uL (ref 140–400)
RBC: 2.45 10*6/uL — ABNORMAL LOW (ref 4.20–5.82)
RDW: 17.9 % — AB (ref 11.0–14.6)
WBC: 7.3 10*3/uL (ref 4.0–10.3)

## 2015-12-12 LAB — COMPREHENSIVE METABOLIC PANEL
ALBUMIN: 2.8 g/dL — AB (ref 3.5–5.0)
ALK PHOS: 53 U/L (ref 40–150)
ALT: 10 U/L (ref 0–55)
AST: 16 U/L (ref 5–34)
Anion Gap: 7 mEq/L (ref 3–11)
BILIRUBIN TOTAL: 0.28 mg/dL (ref 0.20–1.20)
BUN: 26.1 mg/dL — AB (ref 7.0–26.0)
CO2: 24 mEq/L (ref 22–29)
CREATININE: 2 mg/dL — AB (ref 0.7–1.3)
Calcium: 9.4 mg/dL (ref 8.4–10.4)
Chloride: 109 mEq/L (ref 98–109)
EGFR: 38 mL/min/{1.73_m2} — ABNORMAL LOW (ref >90)
GLUCOSE: 118 mg/dL (ref 70–140)
Potassium: 4.7 mEq/L (ref 3.5–5.1)
SODIUM: 139 meq/L (ref 136–145)
TOTAL PROTEIN: 6.3 g/dL — AB (ref 6.4–8.3)

## 2015-12-12 MED ORDER — PALONOSETRON HCL INJECTION 0.25 MG/5ML
INTRAVENOUS | Status: AC
  Filled 2015-12-12: qty 5

## 2015-12-12 MED ORDER — SODIUM CHLORIDE 0.9 % IV SOLN
2400.0000 mg/m2 | INTRAVENOUS | Status: DC
  Administered 2015-12-12: 6000 mg via INTRAVENOUS
  Filled 2015-12-12: qty 120

## 2015-12-12 MED ORDER — FLUOROURACIL CHEMO INJECTION 2.5 GM/50ML
400.0000 mg/m2 | Freq: Once | INTRAVENOUS | Status: AC
  Administered 2015-12-12: 1000 mg via INTRAVENOUS
  Filled 2015-12-12: qty 20

## 2015-12-12 MED ORDER — DEXTROSE 5 % IV SOLN
Freq: Once | INTRAVENOUS | Status: AC
  Administered 2015-12-12: 10:00:00 via INTRAVENOUS

## 2015-12-12 MED ORDER — OXALIPLATIN CHEMO INJECTION 100 MG/20ML
81.0000 mg/m2 | Freq: Once | INTRAVENOUS | Status: AC
  Administered 2015-12-12: 200 mg via INTRAVENOUS
  Filled 2015-12-12: qty 40

## 2015-12-12 MED ORDER — DEXAMETHASONE SODIUM PHOSPHATE 10 MG/ML IJ SOLN
INTRAMUSCULAR | Status: AC
  Filled 2015-12-12: qty 1

## 2015-12-12 MED ORDER — LEUCOVORIN CALCIUM INJECTION 350 MG
400.0000 mg/m2 | Freq: Once | INTRAVENOUS | Status: AC
  Administered 2015-12-12: 996 mg via INTRAVENOUS
  Filled 2015-12-12: qty 49.8

## 2015-12-12 MED ORDER — PALONOSETRON HCL INJECTION 0.25 MG/5ML
0.2500 mg | Freq: Once | INTRAVENOUS | Status: AC
  Administered 2015-12-12: 0.25 mg via INTRAVENOUS

## 2015-12-12 MED ORDER — DEXAMETHASONE SODIUM PHOSPHATE 10 MG/ML IJ SOLN
10.0000 mg | Freq: Once | INTRAMUSCULAR | Status: AC
  Administered 2015-12-12: 10 mg via INTRAVENOUS

## 2015-12-12 NOTE — Patient Instructions (Signed)
[facility] Discharge Instructions for Patients Receiving Chemotherapy  Today you received the following chemotherapy agents:  Leucovorin, Oxaliplatin, Fluorouracil  To help prevent nausea and vomiting after your treatment, we encourage you to take your nausea medication as prescribed.   If you develop nausea and vomiting that is not controlled by your nausea medication, call the clinic.   BELOW ARE SYMPTOMS THAT SHOULD BE REPORTED IMMEDIATELY:  *FEVER GREATER THAN 100.5 F  *CHILLS WITH OR WITHOUT FEVER  NAUSEA AND VOMITING THAT IS NOT CONTROLLED WITH YOUR NAUSEA MEDICATION  *UNUSUAL SHORTNESS OF BREATH  *UNUSUAL BRUISING OR BLEEDING  TENDERNESS IN MOUTH AND THROAT WITH OR WITHOUT PRESENCE OF ULCERS  *URINARY PROBLEMS  *BOWEL PROBLEMS  UNUSUAL RASH Items with * indicate a potential emergency and should be followed up as soon as possible.  Feel free to call the clinic you have any questions or concerns. The clinic phone number is [phone].  Please show the [place] at check-in to the Emergency Department and triage nurse.  Fluorouracil, 5-FU injection What is this medicine? FLUOROURACIL, 5-FU (flure oh YOOR a sil) is a chemotherapy drug. It slows the growth of cancer cells. This medicine is used to treat many types of cancer like breast cancer, colon or rectal cancer, pancreatic cancer, and stomach cancer. This medicine may be used for other purposes; ask your health care provider or pharmacist if you have questions. COMMON BRAND NAME(S): Adrucil What should I tell my health care provider before I take this medicine? They need to know if you have any of these conditions: -blood disorders -dihydropyrimidine dehydrogenase (DPD) deficiency -infection (especially a virus infection such as chickenpox, cold sores, or herpes) -kidney disease -liver disease -malnourished, poor nutrition -recent or ongoing radiation therapy -an unusual or allergic  reaction to fluorouracil, other chemotherapy, other medicines, foods, dyes, or preservatives -pregnant or trying to get pregnant -breast-feeding How should I use this medicine? This drug is given as an infusion or injection into a vein. It is administered in a hospital or clinic by a specially trained health care professional. Talk to your pediatrician regarding the use of this medicine in children. Special care may be needed. Overdosage: If you think you have taken too much of this medicine contact a poison control center or emergency room at once. NOTE: This medicine is only for you. Do not share this medicine with others. What if I miss a dose? It is important not to miss your dose. Call your doctor or health care professional if you are unable to keep an appointment. What may interact with this medicine? -allopurinol -cimetidine -dapsone -digoxin -hydroxyurea -leucovorin -levamisole -medicines for seizures like ethotoin, fosphenytoin, phenytoin -medicines to increase blood counts like filgrastim, pegfilgrastim, sargramostim -medicines that treat or prevent blood clots like warfarin, enoxaparin, and dalteparin -methotrexate -metronidazole -pyrimethamine -some other chemotherapy drugs like busulfan, cisplatin, estramustine, vinblastine -trimethoprim -trimetrexate -vaccines Talk to your doctor or health care professional before taking any of these medicines: -acetaminophen -aspirin -ibuprofen -ketoprofen -naproxen This list may not describe all possible interactions. Give your health care provider a list of all the medicines, herbs, non-prescription drugs, or dietary supplements you use. Also tell them if you smoke, drink alcohol, or use illegal drugs. Some items may interact with your medicine. What should I watch for while using this medicine? Visit your doctor for checks on your progress. This drug may make you feel generally unwell. This is not uncommon, as chemotherapy can  affect healthy cells as well  as cancer cells. Report any side effects. Continue your course of treatment even though you feel ill unless your doctor tells you to stop. In some cases, you may be given additional medicines to help with side effects. Follow all directions for their use. Call your doctor or health care professional for advice if you get a fever, chills or sore throat, or other symptoms of a cold or flu. Do not treat yourself. This drug decreases your body's ability to fight infections. Try to avoid being around people who are sick. This medicine may increase your risk to bruise or bleed. Call your doctor or health care professional if you notice any unusual bleeding. Be careful brushing and flossing your teeth or using a toothpick because you may get an infection or bleed more easily. If you have any dental work done, tell your dentist you are receiving this medicine. Avoid taking products that contain aspirin, acetaminophen, ibuprofen, naproxen, or ketoprofen unless instructed by your doctor. These medicines may hide a fever. Do not become pregnant while taking this medicine. Women should inform their doctor if they wish to become pregnant or think they might be pregnant. There is a potential for serious side effects to an unborn child. Talk to your health care professional or pharmacist for more information. Do not breast-feed an infant while taking this medicine. Men should inform their doctor if they wish to father a child. This medicine may lower sperm counts. Do not treat diarrhea with over the counter products. Contact your doctor if you have diarrhea that lasts more than 2 days or if it is severe and watery. This medicine can make you more sensitive to the sun. Keep out of the sun. If you cannot avoid being in the sun, wear protective clothing and use sunscreen. Do not use sun lamps or tanning beds/booths. What side effects may I notice from receiving this medicine? Side effects that  you should report to your doctor or health care professional as soon as possible: -allergic reactions like skin rash, itching or hives, swelling of the face, lips, or tongue -low blood counts - this medicine may decrease the number of white blood cells, red blood cells and platelets. You may be at increased risk for infections and bleeding. -signs of infection - fever or chills, cough, sore throat, pain or difficulty passing urine -signs of decreased platelets or bleeding - bruising, pinpoint red spots on the skin, black, tarry stools, blood in the urine -signs of decreased red blood cells - unusually weak or tired, fainting spells, lightheadedness -breathing problems -changes in vision -chest pain -mouth sores -nausea and vomiting -pain, swelling, redness at site where injected -pain, tingling, numbness in the hands or feet -redness, swelling, or sores on hands or feet -stomach pain -unusual bleeding Side effects that usually do not require medical attention (report to your doctor or health care professional if they continue or are bothersome): -changes in finger or toe nails -diarrhea -dry or itchy skin -hair loss -headache -loss of appetite -sensitivity of eyes to the light -stomach upset -unusually teary eyes This list may not describe all possible side effects. Call your doctor for medical advice about side effects. You may report side effects to FDA at 1-800-FDA-1088. Where should I keep my medicine? This drug is given in a hospital or clinic and will not be stored at home. NOTE: This sheet is a summary. It may not cover all possible information. If you have questions about this medicine, talk to your doctor, pharmacist,  or health care provider.  2017 [employer]/Gold Standard (2007-05-05 13:53:16) Oxaliplatin Injection What is this medicine? OXALIPLATIN (ox AL i PLA tin) is a chemotherapy drug. It targets fast dividing cells, like cancer cells, and causes these cells to die.  This medicine is used to treat cancers of the colon and rectum, and many other cancers. This medicine may be used for other purposes; ask your health care provider or pharmacist if you have questions. COMMON BRAND NAME(S): Eloxatin What should I tell my health care provider before I take this medicine? They need to know if you have any of these conditions: -kidney disease -an unusual or allergic reaction to oxaliplatin, other chemotherapy, other medicines, foods, dyes, or preservatives -pregnant or trying to get pregnant -breast-feeding How should I use this medicine? This drug is given as an infusion into a vein. It is administered in a hospital or clinic by a specially trained health care professional. Talk to your pediatrician regarding the use of this medicine in children. Special care may be needed. Overdosage: If you think you have taken too much of this medicine contact a poison control center or emergency room at once. NOTE: This medicine is only for you. Do not share this medicine with others. What if I miss a dose? It is important not to miss a dose. Call your doctor or health care professional if you are unable to keep an appointment. What may interact with this medicine? -medicines to increase blood counts like filgrastim, pegfilgrastim, sargramostim -probenecid -some antibiotics like amikacin, gentamicin, neomycin, polymyxin B, streptomycin, tobramycin -zalcitabine Talk to your doctor or health care professional before taking any of these medicines: -acetaminophen -aspirin -ibuprofen -ketoprofen -naproxen This list may not describe all possible interactions. Give your health care provider a list of all the medicines, herbs, non-prescription drugs, or dietary supplements you use. Also tell them if you smoke, drink alcohol, or use illegal drugs. Some items may interact with your medicine. What should I watch for while using this medicine? Your condition will be monitored  carefully while you are receiving this medicine. You will need important blood work done while you are taking this medicine. This medicine can make you more sensitive to cold. Do not drink cold drinks or use ice. Cover exposed skin before coming in contact with cold temperatures or cold objects. When out in cold weather wear warm clothing and cover your mouth and nose to warm the air that goes into your lungs. Tell your doctor if you get sensitive to the cold. This drug may make you feel generally unwell. This is not uncommon, as chemotherapy can affect healthy cells as well as cancer cells. Report any side effects. Continue your course of treatment even though you feel ill unless your doctor tells you to stop. In some cases, you may be given additional medicines to help with side effects. Follow all directions for their use. Call your doctor or health care professional for advice if you get a fever, chills or sore throat, or other symptoms of a cold or flu. Do not treat yourself. This drug decreases your body's ability to fight infections. Try to avoid being around people who are sick. This medicine may increase your risk to bruise or bleed. Call your doctor or health care professional if you notice any unusual bleeding. Be careful brushing and flossing your teeth or using a toothpick because you may get an infection or bleed more easily. If you have any dental work done, tell your dentist you are receiving  this medicine. Avoid taking products that contain aspirin, acetaminophen, ibuprofen, naproxen, or ketoprofen unless instructed by your doctor. These medicines may hide a fever. Do not become pregnant while taking this medicine. Women should inform their doctor if they wish to become pregnant or think they might be pregnant. There is a potential for serious side effects to an unborn child. Talk to your health care professional or pharmacist for more information. Do not breast-feed an infant while taking  this medicine. Call your doctor or health care professional if you get diarrhea. Do not treat yourself. What side effects may I notice from receiving this medicine? Side effects that you should report to your doctor or health care professional as soon as possible: -allergic reactions like skin rash, itching or hives, swelling of the face, lips, or tongue -low blood counts - This drug may decrease the number of white blood cells, red blood cells and platelets. You may be at increased risk for infections and bleeding. -signs of infection - fever or chills, cough, sore throat, pain or difficulty passing urine -signs of decreased platelets or bleeding - bruising, pinpoint red spots on the skin, black, tarry stools, nosebleeds -signs of decreased red blood cells - unusually weak or tired, fainting spells, lightheadedness -breathing problems -chest pain, pressure -cough -diarrhea -jaw tightness -mouth sores -nausea and vomiting -pain, swelling, redness or irritation at the injection site -pain, tingling, numbness in the hands or feet -problems with balance, talking, walking -redness, blistering, peeling or loosening of the skin, including inside the mouth -trouble passing urine or change in the amount of urine Side effects that usually do not require medical attention (report to your doctor or health care professional if they continue or are bothersome): -changes in vision -constipation -hair loss -loss of appetite -metallic taste in the mouth or changes in taste -stomach pain This list may not describe all possible side effects. Call your doctor for medical advice about side effects. You may report side effects to FDA at 1-800-FDA-1088. Where should I keep my medicine? This drug is given in a hospital or clinic and will not be stored at home. NOTE: This sheet is a summary. It may not cover all possible information. If you have questions about this medicine, talk to your doctor, pharmacist,  or health care provider.  2017 [employer]/Gold Standard (2007-07-27 17:22:47) Leucovorin injection What is this medicine? LEUCOVORIN (loo koe VOR in) is used to prevent or treat the harmful effects of some medicines. This medicine is used to treat anemia caused by a low amount of folic acid in the body. It is also used with 5-fluorouracil (5-FU) to treat colon cancer. This medicine may be used for other purposes; ask your health care provider or pharmacist if you have questions. What should I tell my health care provider before I take this medicine? They need to know if you have any of these conditions: -anemia from low levels of vitamin B-12 in the blood -an unusual or allergic reaction to leucovorin, folic acid, other medicines, foods, dyes, or preservatives -pregnant or trying to get pregnant -breast-feeding How should I use this medicine? This medicine is for injection into a muscle or into a vein. It is given by a health care professional in a hospital or clinic setting. Talk to your pediatrician regarding the use of this medicine in children. Special care may be needed. Overdosage: If you think you have taken too much of this medicine contact a poison control center or emergency room at once. NOTE:  This medicine is only for you. Do not share this medicine with others. What if I miss a dose? This does not apply. What may interact with this medicine? -capecitabine -fluorouracil -phenobarbital -phenytoin -primidone -trimethoprim-sulfamethoxazole This list may not describe all possible interactions. Give your health care provider a list of all the medicines, herbs, non-prescription drugs, or dietary supplements you use. Also tell them if you smoke, drink alcohol, or use illegal drugs. Some items may interact with your medicine. What should I watch for while using this medicine? Your condition will be monitored carefully while you are receiving this medicine. This medicine may increase  the side effects of 5-fluorouracil, 5-FU. Tell your doctor or health care professional if you have diarrhea or mouth sores that do not get better or that get worse. What side effects may I notice from receiving this medicine? Side effects that you should report to your doctor or health care professional as soon as possible: -allergic reactions like skin rash, itching or hives, swelling of the face, lips, or tongue -breathing problems -fever, infection -mouth sores -unusual bleeding or bruising -unusually weak or tired Side effects that usually do not require medical attention (report to your doctor or health care professional if they continue or are bothersome): -constipation or diarrhea -loss of appetite -nausea, vomiting This list may not describe all possible side effects. Call your doctor for medical advice about side effects. You may report side effects to FDA at 1-800-FDA-1088. Where should I keep my medicine? This drug is given in a hospital or clinic and will not be stored at home. NOTE: This sheet is a summary. It may not cover all possible information. If you have questions about this medicine, talk to your doctor, pharmacist, or health care provider.  2017 [employer]/Gold Standard (2007-07-06 16:50:29)

## 2015-12-12 NOTE — Progress Notes (Signed)
Hematology and Oncology Follow Up Visit  Jacob [name] [id number] 1945-05-04 70 y.o. 12/12/2015 9:01 AM Jacob Cedar, MD   Principle Diagnosis:   70 year old gentleman with: 1.Normocytic, normochromic anemia diagnosed March 2017. His anemia is due to renal insufficiency with creatinine of 2.9 creatinine clearance 44 mL/m.  2. Prostate cancer in 2013 with a Gleason score 3+4 = 7 and a PSA 5.66 stage TIc.  3. 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 His staging by EUS showed T3 N2 disease without any evidence of metastatic disease.   Prior Therapy:  He is status post radiation therapy for definitive treatment for his prostate cancer. Therapy concluded in December 2013. He is status post packed red cell transfusions in April 2017. He is status post bone marrow biopsy in April 2017. Results did not show any plasma cell disorder or myelodysplasia. He is status post IV iron infusion completed in April 2017. This was repeated in September 2017.   Current therapy:  FOLFOX chemotherapy with cycle 1 to be given on 12/12/2015. The plan is to receive 4 cycles neoadjuvant many for potential surgical resection.  Interim History: Jacob [name] presents today for a follow-up visit. Since the last visit, he had a Port-A-Cath placed after slight delay because of hyperkalemia. Port-A-Cath insertion was done without complications and his potassium have normalized. He is ready to proceed with chemotherapy. He reports he is eating well and have gained weight since last visit. He denied any abdominal pain or dyspepsia. He denied any hematochezia or melena.   He does not report any headaches, blurry vision, syncope or seizures. He does not report any fevers, chills, sweats or weight loss. Does not report any chest pain, palpitation, orthopnea or leg edema. He does not report any cough, wheezing or hemoptysis. He does not report any frequency, urgency or  hesitancy. He does not report any skeletal complaints of arthralgias or myalgias. Remaining review of systems unremarkable.   Medications: I have reviewed the patient's current medications.  Current Outpatient Prescriptions  Medication Sig Dispense Refill  . acetaminophen (TYLENOL) 500 MG tablet Take 1,000 mg by mouth daily as needed for moderate pain or headache.    . allopurinol (ZYLOPRIM) 300 MG tablet Take 300 mg by mouth daily.     Marland Kitchen buPROPion (WELLBUTRIN XL) 300 MG 24 hr tablet Take 300 mg by mouth daily.     . calcium carbonate (TUMS - DOSED IN MG ELEMENTAL CALCIUM) 500 MG chewable tablet Chew 3 tablets by mouth 2 (two) times daily as needed for indigestion or heartburn.    . diazepam (VALIUM) 5 MG tablet Take 5 mg by mouth at bedtime as needed (sleep).     . feeding supplement, GLUCERNA SHAKE, (GLUCERNA SHAKE) LIQD Take 237 mLs by mouth daily.     . furosemide (LASIX) 80 MG tablet Take 40 mg by mouth daily.     Marland Kitchen gabapentin (NEURONTIN) 800 MG tablet Take 400 mg by mouth at bedtime.     Marland Kitchen latanoprost (XALATAN) 0.005 % ophthalmic solution Place 1 drop into both eyes at bedtime.    . lidocaine-prilocaine (EMLA) cream Apply 1 application topically as needed. Apply to port before chemotherapy. 30 g 0  . Melatonin 10 MG TABS Take 1 tablet by mouth at bedtime as needed (sleep).     . Nebivolol HCl (BYSTOLIC) 20 MG TABS Take 20 mg by mouth daily.     Marland Kitchen omeprazole (PRILOSEC) 20 MG capsule Take 20 mg by mouth daily.    Marland Kitchen  oxyCODONE (OXY IR/ROXICODONE) 5 MG immediate release tablet Take 1-2 tablets (5-10 mg total) by mouth every 6 (six) hours as needed for moderate pain, severe pain or breakthrough pain. 15 tablet 0  . OXYGEN Inhale 2 L into the lungs at bedtime as needed (for shortness of breath).    . prochlorperazine (COMPAZINE) 10 MG tablet Take 1 tablet (10 mg total) by mouth every 6 (six) hours as needed for nausea or vomiting. 30 tablet 0  . tamsulosin (FLOMAX) 0.4 MG CAPS capsule TAKE 1  CAPSULE BY MOUTH DAILY 30 capsule 2  . traZODone (DESYREL) 50 MG tablet Take 50 mg by mouth at bedtime.     . Verapamil HCl CR 300 MG CP24 Take 300 mg by mouth at bedtime.      No current facility-administered medications for this visit.      Allergies:  Allergies  Allergen Reactions  . No Known Allergies     Past Medical History, Surgical history, Social history, and Family History were reviewed and updated.   Physical Exam: Blood pressure 122/63, pulse 77, temperature 98.1 F (36.7 C), temperature source Oral, resp. rate 18, height _0  (1.88 m), weight 268 lb 1.6 oz (121.6 kg), SpO2 97 %. ECOG: 1 General appearance: Alert, awake gentleman appeared without distress. Head: Normocephalic, without obvious abnormality no oral ulcers or lesions. Neck: no adenopathy Lymph nodes: Cervical, supraclavicular, and axillary nodes normal. Heart:regular rate and rhythm, S1, S2 normal, no murmur, click, rub or gallop Lung:chest clear, no wheezing, rales, normal symmetric air entry Abdomin: soft, non-tender, without masses or organomegaly no shifting dullness or ascites. EXT:no erythema, induration, or nodules   Lab Results: Lab Results  Component Value Date   WBC 7.3 12/12/2015   HGB 7.3 (L) 12/12/2015   HCT 22.7 (L) 12/12/2015   MCV 92.5 12/12/2015   PLT 216 12/12/2015     Chemistry      Component Value Date/Time   NA 136 12/04/2015 0646   NA 137 11/28/2015 1143   K 4.2 12/04/2015 0646   K 4.9 11/28/2015 1143   CL 109 12/04/2015 0646   CO2 22 12/04/2015 0646   CO2 23 11/28/2015 1143   BUN 23 (H) 12/04/2015 0646   BUN 24.5 11/28/2015 1143   CREATININE 1.79 (H) 12/04/2015 0646   CREATININE 1.9 (H) 11/28/2015 1143      Component Value Date/Time   CALCIUM 9.1 12/04/2015 0646   CALCIUM 9.2 11/28/2015 1143   ALKPHOS 51 11/28/2015 1143   AST 35 (H) 11/28/2015 1143   ALT 25 11/28/2015 1143   BILITOT 0.36 11/28/2015 1143      IMPRESSION: 1. Markedly hypermetabolic mass  in the gastric antrum with a hypermetabolic lymph node adjacent to the proximal duodenum, most indicative of malignancy. No distant metastatic disease. 2. Aortic atherosclerosis (ICD10-170.0). Coronary artery calcification. Bilateral common iliac artery aneurysms. 3. Soft tissue nodule in the anteromedial left chest wall does not exhibit malignant range hypermetabolism.     Impression and Plan:  70 year old woman with the following issues:  1. Normocytic, normochromic anemia presented initially with a hemoglobin of 9.2 now has drifted down to 6.0. His MCV is normal at 97 but does have an elevated RDW. His anemia is related to renal insufficiency as well as iron deficiency.   He will continue to receive supportive transfusion for the time being. His hemoglobin is 7.3 and I anticipate he will need transfusion before the end of the week.  2. Antral mass that is biopsy proven  to be adenocarcinoma. He is status post EUS and PET CT scan which confirmed a staging of about T3 N2 disease. No evidence of distant metastasis noted.  His case was discussed and the GI tumor Board and he is a reasonable candidate for neoadjuvant chemotherapy and consideration for surgical resection.   Risks and benefits of FOLFOX chemotherapy was reviewed with the patient again today. Nausea, vomiting, myelosuppression and peripheral neuropathy other major side effects and were highlighted today. He is agreeable to proceed. The plan is to proceed with total 4 cycles before possible resection if he has an excellent response to therapy.   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 His last protein studies obtained in September 2017 showed no major changes and will be repeated annually.  4. Renal insufficiency: His creatinine remains stable at this time.  5. Antiemetics: Prescription for Compazine as made available to the patient today.  6. IV access:  Port-A-Cath inserted without complications.  7. Follow-up: He follow-up in 2 weeks for cycle 2 of therapy.   [facility], MD 11/29/20179:01 AM

## 2015-12-12 NOTE — Progress Notes (Signed)
Oncology Nurse Navigator Documentation  Oncology Nurse Navigator Flowsheets 12/12/2015  Navigator Location [facility]  Navigator Encounter Type Treatment  Telephone -  Abnormal Finding Date -  Confirmed Diagnosis Date -  Multidisiplinary Clinic Date -  Treatment Initiated Date 12/12/2015  Patient Visit Type MedOnc  Treatment Phase First Chemo Tx--FOLFOX  Barriers/Navigation Needs Education  Education  Symptom Management--discussed diet and management of cold sensitivity. Reviewed proper application of EMLA cream at next treatment  Interventions Education  Referrals -  Education Method Verbal;Teach-back  Support Groups/Services -  Acuity Level 1  Time Spent with Patient 15  Provided contact information for navigator with any questions or navigation needs.

## 2015-12-12 NOTE — Progress Notes (Signed)
Per Dr. Alen Blew, [facility] to treat with Creatinine of 2.0. Patient scheduled for blood transfusion on Saturday for Hgb of 7.3.

## 2015-12-12 NOTE — Progress Notes (Signed)
Met w/ pt regarding copay assistance.  I applied to the [employer] and faxed completed application along w/ required docs needed for processing today.  Also discussed the [place], went over what it covers and gave him an expense sheet.  Pt is approved for the $400 [place].

## 2015-12-12 NOTE — Telephone Encounter (Signed)
Message sent to chemo scheduler to be added. Appointments scheduled per 12/12/15 los. A copy of the AVS report and appointment schedule was given to patient, per 12/12/15 los. °

## 2015-12-14 ENCOUNTER — Ambulatory Visit (HOSPITAL_COMMUNITY)
Admission: RE | Admit: 2015-12-14 | Discharge: 2015-12-14 | Disposition: A | Discharge status: Home / Self Care | Payer: Medicare Other | Source: Ambulatory Visit | Attending: Oncology | Admitting: Oncology

## 2015-12-14 ENCOUNTER — Ambulatory Visit (HOSPITAL_BASED_OUTPATIENT_CLINIC_OR_DEPARTMENT_OTHER): Payer: Medicare Other

## 2015-12-14 DIAGNOSIS — C169 Malignant neoplasm of stomach, unspecified: Secondary | ICD-10-CM | POA: Diagnosis not present

## 2015-12-14 DIAGNOSIS — D649 Anemia, unspecified: Secondary | ICD-10-CM | POA: Diagnosis not present

## 2015-12-14 DIAGNOSIS — D630 Anemia in neoplastic disease: Secondary | ICD-10-CM | POA: Diagnosis present

## 2015-12-14 MED ORDER — DIPHENHYDRAMINE HCL 25 MG PO CAPS
25.0000 mg | ORAL_CAPSULE | Freq: Once | ORAL | Status: AC
  Administered 2015-12-14: 25 mg via ORAL

## 2015-12-14 MED ORDER — SODIUM CHLORIDE 0.9% FLUSH
10.0000 mL | INTRAVENOUS | Status: AC | PRN
  Administered 2015-12-14: 10 mL
  Filled 2015-12-14: qty 10

## 2015-12-14 MED ORDER — HEPARIN SOD (PORK) LOCK FLUSH 100 UNIT/ML IV SOLN
500.0000 [IU] | Freq: Every day | INTRAVENOUS | Status: AC | PRN
  Administered 2015-12-14: 500 [IU]
  Filled 2015-12-14: qty 5

## 2015-12-14 MED ORDER — DIPHENHYDRAMINE HCL 25 MG PO CAPS
ORAL_CAPSULE | ORAL | Status: AC
  Filled 2015-12-14: qty 1

## 2015-12-14 MED ORDER — ACETAMINOPHEN 325 MG PO TABS
ORAL_TABLET | ORAL | Status: AC
  Filled 2015-12-14: qty 2

## 2015-12-14 MED ORDER — ACETAMINOPHEN 325 MG PO TABS
650.0000 mg | ORAL_TABLET | Freq: Once | ORAL | Status: AC
  Administered 2015-12-14: 650 mg via ORAL

## 2015-12-14 NOTE — Patient Instructions (Signed)
Blood Transfusion , Adult A blood transfusion is a procedure in which you receive donated blood, including plasma, platelets, and red blood cells, through an IV tube. You may need a blood transfusion because of illness, surgery, or injury. The blood may come from a donor. You may also be able to donate blood for yourself (autologous blood donation) before a surgery if you know that you might require a blood transfusion. The blood given in a transfusion is made up of different types of cells. You may receive:  Red blood cells. These carry oxygen to the cells in the body.  White blood cells. These help you fight infections.  Platelets. These help your blood to clot.  Plasma. This is the liquid part of your blood and it helps with fluid imbalances. If you have hemophilia or another clotting disorder, you may also receive other types of blood products. Tell a health care provider about:  Any allergies you have.  All medicines you are taking, including vitamins, herbs, eye drops, creams, and over-the-counter medicines.  Any problems you or family members have had with anesthetic medicines.  Any blood disorders you have.  Any surgeries you have had.  Any medical conditions you have, including any recent fever or cold symptoms.  Whether you are pregnant or may be pregnant.  Any previous reactions you have had during a blood transfusion. What are the risks? Generally, this is a safe procedure. However, problems may occur, including:  Having an allergic reaction to something in the donated blood. Hives and itching may be symptoms of this type of reaction.  Fever. This may be a reaction to the white blood cells in the transfused blood. Nausea or chest pain may accompany a fever.  Iron overload. This can happen from having many transfusions.  Transfusion-related acute lung injury (TRALI). This is a rare reaction that causes lung damage. The cause is not known.TRALI can occur within hours  of a transfusion or several days later.  Sudden (acute) or delayed hemolytic reactions. This happens if your blood does not match the cells in your transfusion. Your body's defense system (immune system) may try to attack the new cells. This complication is rare. The symptoms include fever, chills, nausea, and low back pain or chest pain.  Infection or disease transmission. This is rare. What happens before the procedure?  You will have a blood test to determine your blood type. This is necessary to know what kind of blood your body will accept and to match it to the donor blood.  If you are going to have a planned surgery, you may be able to do an autologous blood donation. This may be done in case you need to have a transfusion.  If you have had an allergic reaction to a transfusion in the past, you may be given medicine to help prevent a reaction. This medicine may be given to you by mouth or through an IV tube.  You will have your temperature, blood pressure, and pulse monitored before the transfusion.  Follow instructions from your health care provider about eating and drinking restrictions.  Ask your health care provider about:  Changing or stopping your regular medicines. This is especially important if you are taking diabetes medicines or blood thinners.  Taking medicines such as aspirin and ibuprofen. These medicines can thin your blood. Do not take these medicines before your procedure if your health care provider instructs you not to. What happens during the procedure?  An IV tube will be   inserted into one of your veins.  The bag of donated blood will be attached to your IV tube. The blood will then enter through your vein.  Your temperature, blood pressure, and pulse will be monitored regularly during the transfusion. This monitoring is done to detect early signs of a transfusion reaction.  If you have any signs or symptoms of a reaction, your transfusion will be stopped and  you may be given medicine.  When the transfusion is complete, your IV tube will be removed.  Pressure may be applied to the IV site for a few minutes.  A bandage (dressing) will be applied. The procedure may vary among health care providers and hospitals. What happens after the procedure?  Your temperature, blood pressure, heart rate, breathing rate, and blood oxygen level will be monitored often.  Your blood may be tested to see how you are responding to the transfusion.  You may be warmed with fluids or blankets to maintain a normal body temperature. Summary  A blood transfusion is a procedure in which you receive donated blood, including plasma, platelets, and red blood cells, through an IV tube.  Your temperature, blood pressure, and pulse will be monitored before, during, and after the transfusion.  Your blood may be tested after the transfusion to see how your body has responded. This information is not intended to replace advice given to you by your health care provider. Make sure you discuss any questions you have with your health care provider. Document Released: 12/28/1999 Document Revised: 09/27/2015 Document Reviewed: 09/27/2015 Elsevier Interactive Patient Education  2017 Elsevier Inc.  

## 2015-12-15 LAB — PREPARE RBC (CROSSMATCH)

## 2015-12-17 LAB — TYPE AND SCREEN
BLOOD PRODUCT EXPIRATION DATE: 201712072359
Blood Product Expiration Date: 201712072359
ISSUE DATE / TIME: 201712011136
ISSUE DATE / TIME: 201712011136
UNIT TYPE AND RH: 5100
Unit Type and Rh: 5100

## 2015-12-19 ENCOUNTER — Encounter: Payer: Self-pay | Admitting: Genetic Counselor

## 2015-12-19 ENCOUNTER — Telehealth: Payer: Self-pay | Admitting: Genetic Counselor

## 2015-12-19 ENCOUNTER — Ambulatory Visit: Payer: Self-pay | Admitting: Genetic Counselor

## 2015-12-19 DIAGNOSIS — Z1379 Encounter for other screening for genetic and chromosomal anomalies: Secondary | ICD-10-CM

## 2015-12-19 DIAGNOSIS — D126 Benign neoplasm of colon, unspecified: Secondary | ICD-10-CM

## 2015-12-19 DIAGNOSIS — C61 Malignant neoplasm of prostate: Secondary | ICD-10-CM

## 2015-12-19 DIAGNOSIS — C169 Malignant neoplasm of stomach, unspecified: Secondary | ICD-10-CM

## 2015-12-19 NOTE — Telephone Encounter (Signed)
Revealed negative genetic testing.  Discussed that there was a VUS identified.  We still treat this as a negative test.  We would not change his medical management or test other family members based on the VUS.  Patient voiced understanding.

## 2015-12-19 NOTE — Progress Notes (Signed)
HPI: [name] was previously seen in the [facility] clinic due to a personal and family history of cancer and concerns regarding a hereditary predisposition to cancer. Please refer to our prior cancer genetics clinic note for more information regarding [name] medical, social and family histories, and our assessment and recommendations, at the time. [name] recent genetic test results were disclosed to him, as were recommendations warranted by these results. These results and recommendations are discussed in more detail below.  FAMILY HISTORY:  We obtained a detailed, 4-generation family history.  Significant diagnoses are listed below: Family History  Problem Relation Age of Onset  . Cancer Mother     NOS  . Alcohol abuse Father   . Alcohol abuse Brother 50  . Cancer Paternal Aunt     NOS  . Colon cancer Neg Hx     The patient has three sons who are cancer free. He has four full brothers and a full sister, and a maternal half brother who are cancer free.  His mother may have died from cancer.  She had a brother and sister, and th patient is not aware of them having cancer.  His father died of a heart attack.  He had four brothers and two sisters.  One sister may have had cancer.  Patient's maternal ancestors are of African American descent, and paternal ancestors are of African American descent. There is no reported Ashkenazi Jewish ancestry. There Is no known consanguinity.  GENETIC TEST RESULTS: Genetic testing reported out on December 14, 2015 through the Colorectal cancer panel found no deleterious mutations.  The Colorectal Cancer Panel offered by [facility] includes sequencing and/or duplication/deletion testing of the following 19 genes: APC, ATM, AXIN2, BMPR1A, CDH1, CHEK2, EPCAM, MLH1, MSH2, MSH6, MUTYH, PMS2, POLD1, POLE, PTEN, SCG5/GREM1, SMAD4, STK11, and TP53. Negative genetic testing for the MSH2 inversion analysis ([place] inversion).  The test  report has been scanned into EPIC and is located under the Molecular Pathology section of the Results Review tab.   We discussed with [name] that since the current genetic testing is not perfect, it is possible there may be a gene mutation in one of these genes that current testing cannot detect, but that chance is small. We also discussed, that it is possible that another gene that has not yet been discovered, or that we have not yet tested, is responsible for the cancer diagnoses in the family, and it is, therefore, important to remain in touch with cancer genetics in the future so that we can continue to offer [name] the most up to date genetic testing.   Genetic testing did detect a Variant of Unknown Significance in the MSH6 gene called [id number].  At this time, it is unknown if this variant is associated with increased cancer risk or if this is a normal finding, but most variants such as this get reclassified to being inconsequential. It should not be used to make medical management decisions. With time, we suspect the lab will determine the significance of this variant, if any. If we do learn more about it, we will try to contact [name] to discuss it further. However, it is important to stay in touch with [place] periodically and keep the address and phone number up to date.   CANCER SCREENING RECOMMENDATIONS: This result is reassuring and indicates that [name] likely does not have an increased risk for a future cancer due to a mutation in one of these genes. This  normal test also suggests that [name] cancer was most likely not due to an inherited predisposition associated with one of these genes.  Most cancers happen by chance and this negative test suggests that his cancer falls into this category.  We, therefore, recommended he continue to follow the cancer management and screening guidelines provided by his oncology and primary healthcare provider.    RECOMMENDATIONS FOR FAMILY MEMBERS: Women in this family might be at some increased risk of developing cancer, over the general population risk, simply due to the family history of cancer. We recommended women in this family have a yearly mammogram beginning at age 50, or 74 years younger than the earliest onset of cancer, an annual clinical breast exam, and perform monthly breast self-exams. Women in this family should also have a gynecological exam as recommended by their primary provider. All family members should have a colonoscopy by age 28.  FOLLOW-UP: Lastly, we discussed with [name] that cancer genetics is a rapidly advancing field and it is possible that new genetic tests will be appropriate for him and/or his family members in the future. We encouraged him to remain in contact with cancer genetics on an annual basis so we can update his personal and family histories and let him know of advances in cancer genetics that may benefit this family.   Our contact number was provided. [name] questions were answered to his satisfaction, and he knows he is welcome to call us at anytime with additional questions or concerns.   Roma Kayser, MS, [facility] Certified Genetic Counselor [email or web address]

## 2015-12-20 ENCOUNTER — Ambulatory Visit: Payer: Medicare Other | Admitting: Oncology

## 2015-12-20 ENCOUNTER — Other Ambulatory Visit: Payer: Medicare Other

## 2015-12-20 ENCOUNTER — Ambulatory Visit: Payer: Medicare Other

## 2015-12-24 ENCOUNTER — Encounter: Payer: Self-pay | Admitting: Oncology

## 2015-12-24 NOTE — Progress Notes (Signed)
Pt is approved w/ the [employer] from 12/12/15 to 12/11/16 w/ a 60 day look back period for $4,000.  Emailed copy of approval to Pearletha Furl, Arrie Aran and Elmyra Ricks in the billing department and to HIM to be scanned in pt's chart.

## 2015-12-26 ENCOUNTER — Ambulatory Visit (HOSPITAL_BASED_OUTPATIENT_CLINIC_OR_DEPARTMENT_OTHER): Payer: Medicare Other

## 2015-12-26 ENCOUNTER — Ambulatory Visit (HOSPITAL_BASED_OUTPATIENT_CLINIC_OR_DEPARTMENT_OTHER): Payer: Medicare Other | Admitting: Oncology

## 2015-12-26 ENCOUNTER — Other Ambulatory Visit (HOSPITAL_BASED_OUTPATIENT_CLINIC_OR_DEPARTMENT_OTHER): Payer: Medicare Other

## 2015-12-26 ENCOUNTER — Ambulatory Visit: Payer: Medicare Other | Admitting: Nutrition

## 2015-12-26 ENCOUNTER — Telehealth: Payer: Self-pay | Admitting: Oncology

## 2015-12-26 VITALS — BP 136/72 | HR 66 | Temp 98.1°F | Resp 18 | Ht 74.0 in | Wt 267.0 lb

## 2015-12-26 DIAGNOSIS — C163 Malignant neoplasm of pyloric antrum: Secondary | ICD-10-CM

## 2015-12-26 DIAGNOSIS — N289 Disorder of kidney and ureter, unspecified: Secondary | ICD-10-CM | POA: Diagnosis not present

## 2015-12-26 DIAGNOSIS — Z5111 Encounter for antineoplastic chemotherapy: Secondary | ICD-10-CM | POA: Diagnosis not present

## 2015-12-26 DIAGNOSIS — D649 Anemia, unspecified: Secondary | ICD-10-CM | POA: Diagnosis not present

## 2015-12-26 DIAGNOSIS — D509 Iron deficiency anemia, unspecified: Secondary | ICD-10-CM | POA: Diagnosis not present

## 2015-12-26 DIAGNOSIS — C169 Malignant neoplasm of stomach, unspecified: Secondary | ICD-10-CM | POA: Diagnosis not present

## 2015-12-26 DIAGNOSIS — Z8546 Personal history of malignant neoplasm of prostate: Secondary | ICD-10-CM

## 2015-12-26 LAB — COMPREHENSIVE METABOLIC PANEL
ALT: 12 U/L (ref 0–55)
AST: 21 U/L (ref 5–34)
Albumin: 2.8 g/dL — ABNORMAL LOW (ref 3.5–5.0)
Alkaline Phosphatase: 47 U/L (ref 40–150)
Anion Gap: 6 mEq/L (ref 3–11)
BUN: 17.3 mg/dL (ref 7.0–26.0)
CHLORIDE: 110 meq/L — AB (ref 98–109)
CO2: 23 meq/L (ref 22–29)
CREATININE: 1.8 mg/dL — AB (ref 0.7–1.3)
Calcium: 9.2 mg/dL (ref 8.4–10.4)
EGFR: 43 mL/min/{1.73_m2} — ABNORMAL LOW (ref >90)
GLUCOSE: 90 mg/dL (ref 70–140)
Potassium: 4.5 mEq/L (ref 3.5–5.1)
SODIUM: 138 meq/L (ref 136–145)
Total Bilirubin: 0.38 mg/dL (ref 0.20–1.20)
Total Protein: 6.1 g/dL — ABNORMAL LOW (ref 6.4–8.3)

## 2015-12-26 LAB — CBC WITH DIFFERENTIAL/PLATELET
BASO%: 0 % (ref 0.0–2.0)
Basophils Absolute: 0 10*3/uL (ref 0.0–0.1)
EOS%: 5 % (ref 0.0–7.0)
Eosinophils Absolute: 0.2 10*3/uL (ref 0.0–0.5)
HCT: 26.3 % — ABNORMAL LOW (ref 38.4–49.9)
HGB: 8.5 g/dL — ABNORMAL LOW (ref 13.0–17.1)
LYMPH%: 18.6 % (ref 14.0–49.0)
MCH: 29.1 pg (ref 27.2–33.4)
MCHC: 32.3 g/dL (ref 32.0–36.0)
MCV: 90.1 fL (ref 79.3–98.0)
MONO#: 0.5 10*3/uL (ref 0.1–0.9)
MONO%: 12.8 % (ref 0.0–14.0)
NEUT#: 2.4 10*3/uL (ref 1.5–6.5)
NEUT%: 63.6 % (ref 39.0–75.0)
Platelets: 119 10*3/uL — ABNORMAL LOW (ref 140–400)
RBC: 2.92 10*6/uL — AB (ref 4.20–5.82)
RDW: 15.8 % — ABNORMAL HIGH (ref 11.0–14.6)
WBC: 3.8 10*3/uL — AB (ref 4.0–10.3)
lymph#: 0.7 10*3/uL — ABNORMAL LOW (ref 0.9–3.3)

## 2015-12-26 MED ORDER — FLUOROURACIL CHEMO INJECTION 2.5 GM/50ML
400.0000 mg/m2 | Freq: Once | INTRAVENOUS | Status: AC
  Administered 2015-12-26: 1000 mg via INTRAVENOUS
  Filled 2015-12-26: qty 20

## 2015-12-26 MED ORDER — PALONOSETRON HCL INJECTION 0.25 MG/5ML
0.2500 mg | Freq: Once | INTRAVENOUS | Status: AC
  Administered 2015-12-26: 0.25 mg via INTRAVENOUS

## 2015-12-26 MED ORDER — LEUCOVORIN CALCIUM INJECTION 350 MG
400.0000 mg/m2 | Freq: Once | INTRAVENOUS | Status: AC
  Administered 2015-12-26: 996 mg via INTRAVENOUS
  Filled 2015-12-26: qty 49.8

## 2015-12-26 MED ORDER — OXALIPLATIN CHEMO INJECTION 100 MG/20ML
81.0000 mg/m2 | Freq: Once | INTRAVENOUS | Status: AC
  Administered 2015-12-26: 200 mg via INTRAVENOUS
  Filled 2015-12-26: qty 40

## 2015-12-26 MED ORDER — PALONOSETRON HCL INJECTION 0.25 MG/5ML
INTRAVENOUS | Status: AC
Start: 2015-12-26 — End: 2015-12-26
  Filled 2015-12-26: qty 5

## 2015-12-26 MED ORDER — DEXAMETHASONE SODIUM PHOSPHATE 10 MG/ML IJ SOLN
INTRAMUSCULAR | Status: AC
  Filled 2015-12-26: qty 1

## 2015-12-26 MED ORDER — DEXTROSE 5 % IV SOLN
Freq: Once | INTRAVENOUS | Status: AC
  Administered 2015-12-26: 11:00:00 via INTRAVENOUS

## 2015-12-26 MED ORDER — FLUOROURACIL CHEMO INJECTION 5 GM/100ML
2400.0000 mg/m2 | INTRAVENOUS | Status: DC
  Administered 2015-12-26: 6000 mg via INTRAVENOUS
  Filled 2015-12-26: qty 120

## 2015-12-26 MED ORDER — DEXAMETHASONE SODIUM PHOSPHATE 10 MG/ML IJ SOLN
10.0000 mg | Freq: Once | INTRAMUSCULAR | Status: AC
  Administered 2015-12-26: 10 mg via INTRAVENOUS

## 2015-12-26 NOTE — Patient Instructions (Signed)
[facility] Discharge Instructions for Patients Receiving Chemotherapy  Today you received the following chemotherapy agents Oxaliplatin, Adrucil, Leucovorin.   To help prevent nausea and vomiting after your treatment, we encourage you to take your nausea medication as directed.  NO ZOFRAN FOR 3 DAYS.   If you develop nausea and vomiting that is not controlled by your nausea medication, call the clinic.   BELOW ARE SYMPTOMS THAT SHOULD BE REPORTED IMMEDIATELY:  *FEVER GREATER THAN 100.5 F  *CHILLS WITH OR WITHOUT FEVER  NAUSEA AND VOMITING THAT IS NOT CONTROLLED WITH YOUR NAUSEA MEDICATION  *UNUSUAL SHORTNESS OF BREATH  *UNUSUAL BRUISING OR BLEEDING  TENDERNESS IN MOUTH AND THROAT WITH OR WITHOUT PRESENCE OF ULCERS  *URINARY PROBLEMS  *BOWEL PROBLEMS  UNUSUAL RASH Items with * indicate a potential emergency and should be followed up as soon as possible.  Feel free to call the clinic you have any questions or concerns. The clinic phone number is [phone].  Please show the [facility] at check-in to the Emergency Department and triage nurse.

## 2015-12-26 NOTE — Progress Notes (Signed)
Nutrition follow-up completed with patient diagnosed with gastric cancer. Patient reports he feels pretty good. Weight improved and documented as 267 pounds on December 13. Patient reports he has not been drinking any oral nutrition supplements but enjoys them when he has some. Reports he drank prune juice to help with constipation.  Nutrition diagnosis: Unintentional weight loss improved.  Intervention: Educated patient to continue small frequent meals and snacks with high protein foods. Educated patient that he does not require oral nutrition supplements unless his oral intake is decreased. Encouraged patient to increase water consumption. Teach back method was used.  Monitoring, evaluation, goals:  Patient will tolerate adequate calories and protein to promote weight maintenance.  Next visit: Wednesday, January 10, during infusion.  **Disclaimer: This note was dictated with voice recognition software. Similar sounding words can inadvertently be transcribed and this note may contain transcription errors which may not have been corrected upon publication of note.**

## 2015-12-26 NOTE — Progress Notes (Signed)
Per Dr. Alen Blew, it's OK to treat today with a creatinine of 1.8. Treating nurse notified.

## 2015-12-26 NOTE — Telephone Encounter (Signed)
Appointments complete per 12/13 los - patient aware and will get printout in infsuion.

## 2015-12-26 NOTE — Progress Notes (Signed)
Hematology and Oncology Follow Up Visit  Jacob [name] [id number] 13-Apr-1945 70 y.o. 12/26/2015 9:30 AM Jacob Cedar, MD   Principle Diagnosis:   70 year old gentleman with: 1.Normocytic, normochromic anemia diagnosed March 2017. His anemia is due to renal insufficiency with creatinine of 2.9 creatinine clearance 44 mL/m.  2. Prostate cancer in 2013 with a Gleason score 3+4 = 7 and a PSA 5.66 stage TIc.  3. 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 His staging by EUS showed T3 N2 disease without any evidence of metastatic disease.   Prior Therapy:  He is status post radiation therapy for definitive treatment for his prostate cancer. Therapy concluded in December 2013. He is status post packed red cell transfusions in April 2017. He is status post bone marrow biopsy in April 2017. Results did not show any plasma cell disorder or myelodysplasia. He is status post IV iron infusion completed in April 2017. This was repeated in September 2017.   Current therapy:  FOLFOX chemotherapy with cycle 1 to be given on 12/12/2015.  He is here for cycle 2 of therapy.  Interim History: Jacob [name] presents today for a follow-up visit. Since the last visit, he received his first cycle of chemotherapy without complications. He denied any nausea, vomiting or peripheral neuropathy. His appetite has improved and his weight is stable. He denied any abdominal pain or dyspepsia. He denied any hematochezia or melena. He continues to show reasonable performance status and attends to activities of daily living. He received 2 units of packed red cell transfusion and will do so after each cycle.   He does not report any headaches, blurry vision, syncope or seizures. He does not report any fevers, chills, sweats or weight loss. Does not report any chest pain, palpitation, orthopnea or leg edema. He does not report any cough, wheezing or hemoptysis. He does not  report any frequency, urgency or hesitancy. He does not report any skeletal complaints of arthralgias or myalgias. Remaining review of systems unremarkable.   Medications: I have reviewed the patient's current medications.  Current Outpatient Prescriptions  Medication Sig Dispense Refill  . acetaminophen (TYLENOL) 500 MG tablet Take 1,000 mg by mouth daily as needed for moderate pain or headache.    . allopurinol (ZYLOPRIM) 300 MG tablet Take 300 mg by mouth daily.     Marland Kitchen buPROPion (WELLBUTRIN XL) 300 MG 24 hr tablet Take 300 mg by mouth daily.     . calcium carbonate (TUMS - DOSED IN MG ELEMENTAL CALCIUM) 500 MG chewable tablet Chew 3 tablets by mouth 2 (two) times daily as needed for indigestion or heartburn.    . diazepam (VALIUM) 5 MG tablet Take 5 mg by mouth at bedtime as needed (sleep).     . feeding supplement, GLUCERNA SHAKE, (GLUCERNA SHAKE) LIQD Take 237 mLs by mouth daily.     . furosemide (LASIX) 80 MG tablet Take 40 mg by mouth daily.     Marland Kitchen gabapentin (NEURONTIN) 800 MG tablet Take 400 mg by mouth at bedtime.     Marland Kitchen latanoprost (XALATAN) 0.005 % ophthalmic solution Place 1 drop into both eyes at bedtime.    . lidocaine-prilocaine (EMLA) cream Apply 1 application topically as needed. Apply to port before chemotherapy. 30 g 0  . Melatonin 10 MG TABS Take 1 tablet by mouth at bedtime as needed (sleep).     . Nebivolol HCl (BYSTOLIC) 20 MG TABS Take 20 mg by mouth daily.     Marland Kitchen omeprazole (PRILOSEC)  20 MG capsule Take 20 mg by mouth daily.    Marland Kitchen oxyCODONE (OXY IR/ROXICODONE) 5 MG immediate release tablet Take 1-2 tablets (5-10 mg total) by mouth every 6 (six) hours as needed for moderate pain, severe pain or breakthrough pain. 15 tablet 0  . OXYGEN Inhale 2 L into the lungs at bedtime as needed (for shortness of breath).    . prochlorperazine (COMPAZINE) 10 MG tablet Take 1 tablet (10 mg total) by mouth every 6 (six) hours as needed for nausea or vomiting. 30 tablet 0  . tamsulosin  (FLOMAX) 0.4 MG CAPS capsule TAKE 1 CAPSULE BY MOUTH DAILY 30 capsule 2  . traZODone (DESYREL) 50 MG tablet Take 50 mg by mouth at bedtime.     . Verapamil HCl CR 300 MG CP24 Take 300 mg by mouth at bedtime.      No current facility-administered medications for this visit.      Allergies:  No Known Allergies  Past Medical History, Surgical history, Social history, and Family History were reviewed and updated.   Physical Exam: Blood pressure 136/72, pulse 66, temperature 98.1 F (36.7 C), temperature source Oral, resp. rate 18, height 6' 2"  (1.88 m), weight 267 lb (121.1 kg), SpO2 99 %. ECOG: 1 General appearance: Alert, oriented gentleman without distress. Appeared well. Head: Normocephalic, without obvious abnormality no oral thrush note. Neck: no adenopathy Lymph nodes: Cervical, supraclavicular, and axillary nodes normal. Heart:regular rate and rhythm, S1, S2 normal, no murmur, click, rub or gallop Lung:chest clear, no wheezing, rales, normal symmetric air entry Abdomin: soft, non-tender, without masses or organomegaly no rebound or guarding. EXT:no erythema, induration, or nodules   Lab Results: Lab Results  Component Value Date   WBC 3.8 (L) 12/26/2015   HGB 8.5 (L) 12/26/2015   HCT 26.3 (L) 12/26/2015   MCV 90.1 12/26/2015   PLT 119 (L) 12/26/2015     Chemistry      Component Value Date/Time   NA 139 12/12/2015 0817   K 4.7 12/12/2015 0817   CL 109 12/04/2015 0646   CO2 24 12/12/2015 0817   BUN 26.1 (H) 12/12/2015 0817   CREATININE 2.0 (H) 12/12/2015 0817      Component Value Date/Time   CALCIUM 9.4 12/12/2015 0817   ALKPHOS 53 12/12/2015 0817   AST 16 12/12/2015 0817   ALT 10 12/12/2015 0817   BILITOT 0.28 12/12/2015 0817      IMPRESSION: 1. Markedly hypermetabolic mass in the gastric antrum with a hypermetabolic lymph node adjacent to the proximal duodenum, most indicative of malignancy. No distant metastatic disease. 2. Aortic atherosclerosis  (ICD10-170.0). Coronary artery calcification. Bilateral common iliac artery aneurysms. 3. Soft tissue nodule in the anteromedial left chest wall does not exhibit malignant range hypermetabolism.     Impression and Plan:  70 year old woman with the following issues:  1. Normocytic, normochromic anemia presented initially with a hemoglobin of 9.2 now has drifted down to 6.0. His MCV is normal at 97 but does have an elevated RDW. His anemia is related to renal insufficiency as well as iron deficiency.   He will continue to receive supportive transfusion after each cycle of chemotherapy in preparation for possible surgical resection.  2. Antral mass that is biopsy proven to be adenocarcinoma. He is status post EUS and PET CT scan which confirmed a staging of about T3 N2 disease. No evidence of distant metastasis noted.  His case was discussed and the GI tumor Board and he is a reasonable candidate for neoadjuvant  chemotherapy and consideration for surgical resection.    He is currently receiving FOLFOX and tolerated the first cycle well. The plan is to proceed with cycle 2 without any dose reduction or delay. Repeat imaging studies will be arranged after 4 cycles.  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 His last protein studies obtained in September 2017 showed no major changes and will be repeated annually.  4. Renal insufficiency: His creatinine remains stable at this time. no major changes noted in his kidney function.  5. Antiemetics: Prescription for Compazine as made available to the patient today.  6. IV access: Port-A-Cath inserted without complications.  7. Follow-up: He follow-up in 2 weeks for cycle 3 of therapy.   [facility], MD 12/13/20179:30 AM

## 2015-12-28 ENCOUNTER — Ambulatory Visit (HOSPITAL_BASED_OUTPATIENT_CLINIC_OR_DEPARTMENT_OTHER): Payer: Medicare Other

## 2015-12-28 DIAGNOSIS — D649 Anemia, unspecified: Secondary | ICD-10-CM | POA: Diagnosis not present

## 2015-12-28 DIAGNOSIS — C169 Malignant neoplasm of stomach, unspecified: Secondary | ICD-10-CM | POA: Diagnosis not present

## 2015-12-28 LAB — PREPARE RBC (CROSSMATCH)

## 2015-12-28 MED ORDER — DIPHENHYDRAMINE HCL 25 MG PO CAPS
25.0000 mg | ORAL_CAPSULE | Freq: Once | ORAL | Status: AC
  Administered 2015-12-28: 25 mg via ORAL

## 2015-12-28 MED ORDER — SODIUM CHLORIDE 0.9 % IV SOLN
250.0000 mL | Freq: Once | INTRAVENOUS | Status: AC
  Administered 2015-12-28: 250 mL via INTRAVENOUS

## 2015-12-28 MED ORDER — HEPARIN SOD (PORK) LOCK FLUSH 100 UNIT/ML IV SOLN
500.0000 [IU] | Freq: Every day | INTRAVENOUS | Status: AC | PRN
  Administered 2015-12-28: 500 [IU]
  Filled 2015-12-28: qty 5

## 2015-12-28 MED ORDER — ACETAMINOPHEN 325 MG PO TABS
650.0000 mg | ORAL_TABLET | Freq: Once | ORAL | Status: AC
  Administered 2015-12-28: 650 mg via ORAL

## 2015-12-28 MED ORDER — ACETAMINOPHEN 325 MG PO TABS
ORAL_TABLET | ORAL | Status: AC
  Filled 2015-12-28: qty 2

## 2015-12-28 MED ORDER — DIPHENHYDRAMINE HCL 25 MG PO CAPS
ORAL_CAPSULE | ORAL | Status: AC
  Filled 2015-12-28: qty 1

## 2015-12-28 MED ORDER — SODIUM CHLORIDE 0.9% FLUSH
10.0000 mL | INTRAVENOUS | Status: AC | PRN
  Administered 2015-12-28: 10 mL
  Filled 2015-12-28: qty 10

## 2015-12-28 NOTE — Patient Instructions (Signed)
Blood Transfusion , Adult A blood transfusion is a procedure in which you receive donated blood, including plasma, platelets, and red blood cells, through an IV tube. You may need a blood transfusion because of illness, surgery, or injury. The blood may come from a donor. You may also be able to donate blood for yourself (autologous blood donation) before a surgery if you know that you might require a blood transfusion. The blood given in a transfusion is made up of different types of cells. You may receive:  Red blood cells. These carry oxygen to the cells in the body.  White blood cells. These help you fight infections.  Platelets. These help your blood to clot.  Plasma. This is the liquid part of your blood and it helps with fluid imbalances. If you have hemophilia or another clotting disorder, you may also receive other types of blood products. Tell a health care provider about:  Any allergies you have.  All medicines you are taking, including vitamins, herbs, eye drops, creams, and over-the-counter medicines.  Any problems you or family members have had with anesthetic medicines.  Any blood disorders you have.  Any surgeries you have had.  Any medical conditions you have, including any recent fever or cold symptoms.  Whether you are pregnant or may be pregnant.  Any previous reactions you have had during a blood transfusion. What are the risks? Generally, this is a safe procedure. However, problems may occur, including:  Having an allergic reaction to something in the donated blood. Hives and itching may be symptoms of this type of reaction.  Fever. This may be a reaction to the white blood cells in the transfused blood. Nausea or chest pain may accompany a fever.  Iron overload. This can happen from having many transfusions.  Transfusion-related acute lung injury (TRALI). This is a rare reaction that causes lung damage. The cause is not known.TRALI can occur within hours  of a transfusion or several days later.  Sudden (acute) or delayed hemolytic reactions. This happens if your blood does not match the cells in your transfusion. Your body's defense system (immune system) may try to attack the new cells. This complication is rare. The symptoms include fever, chills, nausea, and low back pain or chest pain.  Infection or disease transmission. This is rare. What happens before the procedure?  You will have a blood test to determine your blood type. This is necessary to know what kind of blood your body will accept and to match it to the donor blood.  If you are going to have a planned surgery, you may be able to do an autologous blood donation. This may be done in case you need to have a transfusion.  If you have had an allergic reaction to a transfusion in the past, you may be given medicine to help prevent a reaction. This medicine may be given to you by mouth or through an IV tube.  You will have your temperature, blood pressure, and pulse monitored before the transfusion.  Follow instructions from your health care provider about eating and drinking restrictions.  Ask your health care provider about:  Changing or stopping your regular medicines. This is especially important if you are taking diabetes medicines or blood thinners.  Taking medicines such as aspirin and ibuprofen. These medicines can thin your blood. Do not take these medicines before your procedure if your health care provider instructs you not to. What happens during the procedure?  An IV tube will be   inserted into one of your veins.  The bag of donated blood will be attached to your IV tube. The blood will then enter through your vein.  Your temperature, blood pressure, and pulse will be monitored regularly during the transfusion. This monitoring is done to detect early signs of a transfusion reaction.  If you have any signs or symptoms of a reaction, your transfusion will be stopped and  you may be given medicine.  When the transfusion is complete, your IV tube will be removed.  Pressure may be applied to the IV site for a few minutes.  A bandage (dressing) will be applied. The procedure may vary among health care providers and hospitals. What happens after the procedure?  Your temperature, blood pressure, heart rate, breathing rate, and blood oxygen level will be monitored often.  Your blood may be tested to see how you are responding to the transfusion.  You may be warmed with fluids or blankets to maintain a normal body temperature. Summary  A blood transfusion is a procedure in which you receive donated blood, including plasma, platelets, and red blood cells, through an IV tube.  Your temperature, blood pressure, and pulse will be monitored before, during, and after the transfusion.  Your blood may be tested after the transfusion to see how your body has responded. This information is not intended to replace advice given to you by your health care provider. Make sure you discuss any questions you have with your health care provider. Document Released: 12/28/1999 Document Revised: 09/27/2015 Document Reviewed: 09/27/2015 Elsevier Interactive Patient Education  2017 Elsevier Inc.  

## 2015-12-31 LAB — TYPE AND SCREEN
ABO/RH(D): O POS
Antibody Screen: NEGATIVE
UNIT DIVISION: 0
UNIT DIVISION: 0

## 2016-01-09 ENCOUNTER — Other Ambulatory Visit (HOSPITAL_BASED_OUTPATIENT_CLINIC_OR_DEPARTMENT_OTHER): Payer: Medicare Other

## 2016-01-09 ENCOUNTER — Ambulatory Visit (HOSPITAL_BASED_OUTPATIENT_CLINIC_OR_DEPARTMENT_OTHER): Payer: Medicare Other | Admitting: Oncology

## 2016-01-09 ENCOUNTER — Telehealth: Payer: Self-pay | Admitting: Oncology

## 2016-01-09 ENCOUNTER — Telehealth: Payer: Self-pay | Admitting: *Deleted

## 2016-01-09 ENCOUNTER — Ambulatory Visit (HOSPITAL_BASED_OUTPATIENT_CLINIC_OR_DEPARTMENT_OTHER): Payer: Medicare Other

## 2016-01-09 VITALS — BP 150/85 | HR 69 | Temp 98.3°F | Resp 18 | Ht 74.0 in | Wt 262.7 lb

## 2016-01-09 DIAGNOSIS — Z5111 Encounter for antineoplastic chemotherapy: Secondary | ICD-10-CM | POA: Diagnosis not present

## 2016-01-09 DIAGNOSIS — N289 Disorder of kidney and ureter, unspecified: Secondary | ICD-10-CM | POA: Diagnosis not present

## 2016-01-09 DIAGNOSIS — C169 Malignant neoplasm of stomach, unspecified: Secondary | ICD-10-CM

## 2016-01-09 DIAGNOSIS — C163 Malignant neoplasm of pyloric antrum: Secondary | ICD-10-CM | POA: Diagnosis not present

## 2016-01-09 DIAGNOSIS — D6959 Other secondary thrombocytopenia: Secondary | ICD-10-CM

## 2016-01-09 DIAGNOSIS — D509 Iron deficiency anemia, unspecified: Secondary | ICD-10-CM | POA: Diagnosis not present

## 2016-01-09 DIAGNOSIS — D649 Anemia, unspecified: Secondary | ICD-10-CM

## 2016-01-09 DIAGNOSIS — Z8546 Personal history of malignant neoplasm of prostate: Secondary | ICD-10-CM

## 2016-01-09 LAB — CBC WITH DIFFERENTIAL/PLATELET
BASO%: 0.2 % (ref 0.0–2.0)
BASOS ABS: 0 10*3/uL (ref 0.0–0.1)
EOS ABS: 0.1 10*3/uL (ref 0.0–0.5)
EOS%: 2.1 % (ref 0.0–7.0)
HEMATOCRIT: 30.5 % — AB (ref 38.4–49.9)
HEMOGLOBIN: 10.1 g/dL — AB (ref 13.0–17.1)
LYMPH#: 0.7 10*3/uL — AB (ref 0.9–3.3)
LYMPH%: 16.4 % (ref 14.0–49.0)
MCH: 29 pg (ref 27.2–33.4)
MCHC: 33.1 g/dL (ref 32.0–36.0)
MCV: 87.6 fL (ref 79.3–98.0)
MONO#: 0.7 10*3/uL (ref 0.1–0.9)
MONO%: 16 % — AB (ref 0.0–14.0)
NEUT%: 65.3 % (ref 39.0–75.0)
NEUTROS ABS: 2.8 10*3/uL (ref 1.5–6.5)
PLATELETS: 95 10*3/uL — AB (ref 140–400)
RBC: 3.48 10*6/uL — ABNORMAL LOW (ref 4.20–5.82)
RDW: 16.7 % — AB (ref 11.0–14.6)
WBC: 4.3 10*3/uL (ref 4.0–10.3)

## 2016-01-09 LAB — COMPREHENSIVE METABOLIC PANEL
ALBUMIN: 3.1 g/dL — AB (ref 3.5–5.0)
ALK PHOS: 49 U/L (ref 40–150)
ALT: 19 U/L (ref 0–55)
ANION GAP: 7 meq/L (ref 3–11)
AST: 35 U/L — ABNORMAL HIGH (ref 5–34)
BILIRUBIN TOTAL: 0.53 mg/dL (ref 0.20–1.20)
BUN: 20.2 mg/dL (ref 7.0–26.0)
CALCIUM: 9.8 mg/dL (ref 8.4–10.4)
CO2: 24 mEq/L (ref 22–29)
Chloride: 108 mEq/L (ref 98–109)
Creatinine: 1.8 mg/dL — ABNORMAL HIGH (ref 0.7–1.3)
EGFR: 45 mL/min/{1.73_m2} — AB (ref >90)
GLUCOSE: 99 mg/dL (ref 70–140)
POTASSIUM: 4.7 meq/L (ref 3.5–5.1)
SODIUM: 140 meq/L (ref 136–145)
TOTAL PROTEIN: 6.6 g/dL (ref 6.4–8.3)

## 2016-01-09 MED ORDER — OXALIPLATIN CHEMO INJECTION 100 MG/20ML
80.0000 mg/m2 | Freq: Once | INTRAVENOUS | Status: AC
  Administered 2016-01-09: 200 mg via INTRAVENOUS
  Filled 2016-01-09: qty 40

## 2016-01-09 MED ORDER — LEUCOVORIN CALCIUM INJECTION 350 MG
400.0000 mg/m2 | Freq: Once | INTRAVENOUS | Status: AC
  Administered 2016-01-09: 996 mg via INTRAVENOUS
  Filled 2016-01-09: qty 49.8

## 2016-01-09 MED ORDER — PALONOSETRON HCL INJECTION 0.25 MG/5ML
INTRAVENOUS | Status: AC
  Filled 2016-01-09: qty 5

## 2016-01-09 MED ORDER — PALONOSETRON HCL INJECTION 0.25 MG/5ML
0.2500 mg | Freq: Once | INTRAVENOUS | Status: AC
  Administered 2016-01-09: 0.25 mg via INTRAVENOUS

## 2016-01-09 MED ORDER — DEXAMETHASONE SODIUM PHOSPHATE 10 MG/ML IJ SOLN
10.0000 mg | Freq: Once | INTRAMUSCULAR | Status: AC
  Administered 2016-01-09: 10 mg via INTRAVENOUS

## 2016-01-09 MED ORDER — DEXAMETHASONE SODIUM PHOSPHATE 10 MG/ML IJ SOLN
INTRAMUSCULAR | Status: AC
  Filled 2016-01-09: qty 1

## 2016-01-09 MED ORDER — SODIUM CHLORIDE 0.9 % IV SOLN
2400.0000 mg/m2 | INTRAVENOUS | Status: DC
  Administered 2016-01-09: 6000 mg via INTRAVENOUS
  Filled 2016-01-09: qty 120

## 2016-01-09 MED ORDER — FLUOROURACIL CHEMO INJECTION 2.5 GM/50ML
400.0000 mg/m2 | Freq: Once | INTRAVENOUS | Status: AC
  Administered 2016-01-09: 1000 mg via INTRAVENOUS
  Filled 2016-01-09: qty 20

## 2016-01-09 MED ORDER — DEXTROSE 5 % IV SOLN
Freq: Once | INTRAVENOUS | Status: AC
  Administered 2016-01-09: 10:00:00 via INTRAVENOUS

## 2016-01-09 NOTE — Patient Instructions (Signed)
[facility] Discharge Instructions for Patients Receiving Chemotherapy  Today you received the following chemotherapy agents oxaliplatin/lecovorin/florouracil  To help prevent nausea and vomiting after your treatment, we encourage you to take your nausea medication as directed  If you develop nausea and vomiting that is not controlled by your nausea medication, call the clinic.   BELOW ARE SYMPTOMS THAT SHOULD BE REPORTED IMMEDIATELY:  *FEVER GREATER THAN 100.5 F  *CHILLS WITH OR WITHOUT FEVER  NAUSEA AND VOMITING THAT IS NOT CONTROLLED WITH YOUR NAUSEA MEDICATION  *UNUSUAL SHORTNESS OF BREATH  *UNUSUAL BRUISING OR BLEEDING  TENDERNESS IN MOUTH AND THROAT WITH OR WITHOUT PRESENCE OF ULCERS  *URINARY PROBLEMS  *BOWEL PROBLEMS  UNUSUAL RASH Items with * indicate a potential emergency and should be followed up as soon as possible.  Feel free to call the clinic you have any questions or concerns. The clinic phone number is [phone].

## 2016-01-09 NOTE — Progress Notes (Signed)
Hematology and Oncology Follow Up Visit  Jacob [name] [id number] 1945-02-20 70 y.o. 01/09/2016 8:43 AM [name], MD   Principle Diagnosis:   70 year old gentleman with: 1.Normocytic, normochromic anemia diagnosed March 2017. His anemia is due to renal insufficiency with creatinine of 2.9 creatinine clearance 44 mL/m.  2. Prostate cancer in 2013 with a Gleason score 3+4 = 7 and a PSA 5.66 stage TIc.  3. 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 His staging by EUS showed T3 N2 disease without any evidence of metastatic disease.   Prior Therapy:  He is status post radiation therapy for definitive treatment for his prostate cancer. Therapy concluded in December 2013. He is status post packed red cell transfusions in April 2017. He is status post bone marrow biopsy in April 2017. Results did not show any plasma cell disorder or myelodysplasia. He is status post IV iron infusion completed in April 2017. This was repeated in September 2017.   Current therapy:  FOLFOX chemotherapy with cycle 1 to be given on 12/12/2015.  He is here for cycle 3 of therapy.  Interim History:  [name] presents today for a follow-up visit. Since the last visit, he continues to tolerate systemic chemotherapy without complications. He denied any nausea, vomiting or peripheral neuropathy. His appetite has improved but did lose a few pounds since last visit. He denied any abdominal pain or dyspepsia. He denied any hematochezia or melena. He continues to show reasonable performance status and attends to activities of daily living. He does report unsteadiness with ambulation although did not have any falls or syncope.   He does not report any headaches, blurry vision, syncope or seizures. He does not report any fevers, chills, sweats or weight loss. Does not report any chest pain, palpitation, orthopnea or leg edema. He does not report any cough, wheezing  or hemoptysis. He does not report any frequency, urgency or hesitancy. He does not report any skeletal complaints of arthralgias or myalgias. Remaining review of systems unremarkable.   Medications: I have reviewed the patient's current medications.  Current Outpatient Prescriptions  Medication Sig Dispense Refill  . acetaminophen (TYLENOL) 500 MG tablet Take 1,000 mg by mouth daily as needed for moderate pain or headache.    . allopurinol (ZYLOPRIM) 300 MG tablet Take 300 mg by mouth daily.     Marland Kitchen buPROPion (WELLBUTRIN XL) 300 MG 24 hr tablet Take 300 mg by mouth daily.     . calcium carbonate (TUMS - DOSED IN MG ELEMENTAL CALCIUM) 500 MG chewable tablet Chew 3 tablets by mouth 2 (two) times daily as needed for indigestion or heartburn.    . diazepam (VALIUM) 5 MG tablet Take 5 mg by mouth at bedtime as needed (sleep).     . feeding supplement, GLUCERNA SHAKE, (GLUCERNA SHAKE) LIQD Take 237 mLs by mouth daily.     . furosemide (LASIX) 80 MG tablet Take 40 mg by mouth daily.     Marland Kitchen gabapentin (NEURONTIN) 800 MG tablet Take 400 mg by mouth at bedtime.     Marland Kitchen latanoprost (XALATAN) 0.005 % ophthalmic solution Place 1 drop into both eyes at bedtime.    . lidocaine-prilocaine (EMLA) cream Apply 1 application topically as needed. Apply to port before chemotherapy. 30 g 0  . Melatonin 10 MG TABS Take 1 tablet by mouth at bedtime as needed (sleep).     . Nebivolol HCl (BYSTOLIC) 20 MG TABS Take 20 mg by mouth daily.     Marland Kitchen  omeprazole (PRILOSEC) 20 MG capsule Take 20 mg by mouth daily.    Marland Kitchen oxyCODONE (OXY IR/ROXICODONE) 5 MG immediate release tablet Take 1-2 tablets (5-10 mg total) by mouth every 6 (six) hours as needed for moderate pain, severe pain or breakthrough pain. 15 tablet 0  . OXYGEN Inhale 2 L into the lungs at bedtime as needed (for shortness of breath).    . prochlorperazine (COMPAZINE) 10 MG tablet Take 1 tablet (10 mg total) by mouth every 6 (six) hours as needed for nausea or vomiting. 30  tablet 0  . tamsulosin (FLOMAX) 0.4 MG CAPS capsule TAKE 1 CAPSULE BY MOUTH DAILY 30 capsule 2  . traZODone (DESYREL) 50 MG tablet Take 50 mg by mouth at bedtime.     . Verapamil HCl CR 300 MG CP24 Take 300 mg by mouth at bedtime.      No current facility-administered medications for this visit.      Allergies:  No Known Allergies  Past Medical History, Surgical history, Social history, and Family History were reviewed and updated.   Physical Exam: Blood pressure (!) 150/85, pulse 69, temperature 98.3 F (36.8 C), temperature source Oral, resp. rate 18, height 6' 2"  (1.88 m), weight 262 lb 11.2 oz (119.2 kg), SpO2 100 %. ECOG: 1 General appearance: Alert, awake gentleman without distress. Head: Normocephalic, without obvious abnormality no oral ulcers or lesions. Neck: no adenopathy Lymph nodes: Cervical, supraclavicular, and axillary nodes normal. Heart:regular rate and rhythm, S1, S2 normal, no murmur, click, rub or gallop Lung:chest clear, no wheezing, rales, normal symmetric air entry Abdomin: soft, non-tender, without masses or organomegaly no shifting dose or ascites. EXT:no erythema, induration, or nodules   Lab Results: Lab Results  Component Value Date   WBC 4.3 01/09/2016   HGB 10.1 (L) 01/09/2016   HCT 30.5 (L) 01/09/2016   MCV 87.6 01/09/2016   PLT 95 (L) 01/09/2016     Chemistry      Component Value Date/Time   NA 138 12/26/2015 0853   K 4.5 12/26/2015 0853   CL 109 12/04/2015 0646   CO2 23 12/26/2015 0853   BUN 17.3 12/26/2015 0853   CREATININE 1.8 (H) 12/26/2015 0853      Component Value Date/Time   CALCIUM 9.2 12/26/2015 0853   ALKPHOS 47 12/26/2015 0853   AST 21 12/26/2015 0853   ALT 12 12/26/2015 0853   BILITOT 0.38 12/26/2015 0853          Impression and Plan:  70 year old woman with the following issues:  1. Normocytic, normochromic anemia presented initially with a hemoglobin of 9.2 now has drifted down to 6.0. His MCV is normal  at 97 but does have an elevated RDW. His anemia is related to renal insufficiency as well as iron deficiency.   He will continue to receive supportive transfusion after each cycle of chemotherapy in preparation for possible surgical resection. His hemoglobin today appears adequate and does not require any transfusion. He'll likely require transfusion after the next treatment.  2. Antral mass that is biopsy proven to be adenocarcinoma. He is status post EUS and PET CT scan which confirmed a staging of about T3 N2 disease. No evidence of distant metastasis noted.  His case was discussed and the GI tumor Board and he is a reasonable candidate for neoadjuvant chemotherapy and consideration for surgical resection.    He is currently receiving FOLFOX and tolerated the first 2 cycles without complications. The plan is to proceed with cycle 3 without any dose  reduction or delay. The plan is to proceed with total of 4 cycles and repeat PET scan at that time.  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 His last protein studies obtained in September 2017 showed no major changes and will be repeated annually.  4. Renal insufficiency: His creatinine remains stable at this time. no major changes noted in his kidney function.  5. Antiemetics: Prescription for Compazine as made available to the patient today.  6. IV access: Port-A-Cath is visualized without complications.  7. 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 Oxaliplatin will be given as long as his platelet count above 9000.  8. Follow-up: He follow-up in 2 weeks for cycle 4 of therapy.   [facility], MD 12/27/20178:43 AM

## 2016-01-09 NOTE — Progress Notes (Signed)
Ok to treat with SCR 1.8 per Dr. Alen Blew

## 2016-01-09 NOTE — Telephone Encounter (Signed)
Per LOS I have scheduled appts and notified the scheduler 

## 2016-01-09 NOTE — Telephone Encounter (Signed)
Appointments scheduled per 12/27 LOS. Patient given AVS report and calendars with future scheduled appointments. °

## 2016-01-11 ENCOUNTER — Ambulatory Visit (HOSPITAL_BASED_OUTPATIENT_CLINIC_OR_DEPARTMENT_OTHER): Payer: Medicare Other

## 2016-01-11 VITALS — BP 132/77 | HR 66 | Temp 98.4°F | Resp 18

## 2016-01-11 DIAGNOSIS — C169 Malignant neoplasm of stomach, unspecified: Secondary | ICD-10-CM

## 2016-01-11 MED ORDER — HEPARIN SOD (PORK) LOCK FLUSH 100 UNIT/ML IV SOLN
500.0000 [IU] | Freq: Once | INTRAVENOUS | Status: AC | PRN
  Administered 2016-01-11: 500 [IU]
  Filled 2016-01-11: qty 5

## 2016-01-11 MED ORDER — SODIUM CHLORIDE 0.9% FLUSH
10.0000 mL | INTRAVENOUS | Status: DC | PRN
  Administered 2016-01-11: 10 mL
  Filled 2016-01-11: qty 10

## 2016-01-15 ENCOUNTER — Ambulatory Visit (HOSPITAL_COMMUNITY)
Admission: RE | Admit: 2016-01-15 | Discharge: 2016-01-15 | Disposition: A | Discharge status: Home / Self Care | Payer: Medicare Other | Source: Ambulatory Visit | Attending: Oncology | Admitting: Oncology

## 2016-01-23 ENCOUNTER — Telehealth: Payer: Self-pay | Admitting: Oncology

## 2016-01-23 ENCOUNTER — Ambulatory Visit (HOSPITAL_BASED_OUTPATIENT_CLINIC_OR_DEPARTMENT_OTHER): Payer: Medicare Other | Admitting: Oncology

## 2016-01-23 ENCOUNTER — Other Ambulatory Visit (HOSPITAL_BASED_OUTPATIENT_CLINIC_OR_DEPARTMENT_OTHER): Payer: Medicare Other

## 2016-01-23 ENCOUNTER — Ambulatory Visit: Payer: Medicare Other | Admitting: Nutrition

## 2016-01-23 ENCOUNTER — Ambulatory Visit (HOSPITAL_BASED_OUTPATIENT_CLINIC_OR_DEPARTMENT_OTHER): Payer: Medicare Other

## 2016-01-23 VITALS — BP 147/81 | HR 78 | Temp 98.3°F | Resp 18 | Ht 74.0 in | Wt 263.3 lb

## 2016-01-23 DIAGNOSIS — C169 Malignant neoplasm of stomach, unspecified: Secondary | ICD-10-CM

## 2016-01-23 DIAGNOSIS — N289 Disorder of kidney and ureter, unspecified: Secondary | ICD-10-CM

## 2016-01-23 DIAGNOSIS — Z5111 Encounter for antineoplastic chemotherapy: Secondary | ICD-10-CM

## 2016-01-23 DIAGNOSIS — C163 Malignant neoplasm of pyloric antrum: Secondary | ICD-10-CM

## 2016-01-23 DIAGNOSIS — D509 Iron deficiency anemia, unspecified: Secondary | ICD-10-CM

## 2016-01-23 DIAGNOSIS — D649 Anemia, unspecified: Secondary | ICD-10-CM

## 2016-01-23 DIAGNOSIS — Z8546 Personal history of malignant neoplasm of prostate: Secondary | ICD-10-CM

## 2016-01-23 LAB — CBC WITH DIFFERENTIAL/PLATELET
BASO%: 1.8 % (ref 0.0–2.0)
Basophils Absolute: 0.1 10*3/uL (ref 0.0–0.1)
EOS ABS: 0.1 10*3/uL (ref 0.0–0.5)
EOS%: 3.7 % (ref 0.0–7.0)
HCT: 31 % — ABNORMAL LOW (ref 38.4–49.9)
HEMOGLOBIN: 10.1 g/dL — AB (ref 13.0–17.1)
LYMPH#: 0.6 10*3/uL — AB (ref 0.9–3.3)
LYMPH%: 15.6 % (ref 14.0–49.0)
MCH: 28.8 pg (ref 27.2–33.4)
MCHC: 32.6 g/dL (ref 32.0–36.0)
MCV: 88.3 fL (ref 79.3–98.0)
MONO#: 0.7 10*3/uL (ref 0.1–0.9)
MONO%: 18 % — ABNORMAL HIGH (ref 0.0–14.0)
NEUT%: 60.9 % (ref 39.0–75.0)
NEUTROS ABS: 2.4 10*3/uL (ref 1.5–6.5)
PLATELETS: 97 10*3/uL — AB (ref 140–400)
RBC: 3.51 10*6/uL — ABNORMAL LOW (ref 4.20–5.82)
RDW: 18.2 % — AB (ref 11.0–14.6)
WBC: 4 10*3/uL (ref 4.0–10.3)

## 2016-01-23 LAB — COMPREHENSIVE METABOLIC PANEL
ALT: 23 U/L (ref 0–55)
ANION GAP: 9 meq/L (ref 3–11)
AST: 33 U/L (ref 5–34)
Albumin: 3 g/dL — ABNORMAL LOW (ref 3.5–5.0)
Alkaline Phosphatase: 49 U/L (ref 40–150)
BILIRUBIN TOTAL: 0.55 mg/dL (ref 0.20–1.20)
BUN: 16.5 mg/dL (ref 7.0–26.0)
CHLORIDE: 108 meq/L (ref 98–109)
CO2: 23 meq/L (ref 22–29)
CREATININE: 1.8 mg/dL — AB (ref 0.7–1.3)
Calcium: 9.9 mg/dL (ref 8.4–10.4)
EGFR: 42 mL/min/{1.73_m2} — ABNORMAL LOW (ref >90)
GLUCOSE: 109 mg/dL (ref 70–140)
Potassium: 4.5 mEq/L (ref 3.5–5.1)
SODIUM: 140 meq/L (ref 136–145)
TOTAL PROTEIN: 6.6 g/dL (ref 6.4–8.3)

## 2016-01-23 MED ORDER — PALONOSETRON HCL INJECTION 0.25 MG/5ML
0.2500 mg | Freq: Once | INTRAVENOUS | Status: AC
  Administered 2016-01-23: 0.25 mg via INTRAVENOUS

## 2016-01-23 MED ORDER — DEXTROSE 5 % IV SOLN
Freq: Once | INTRAVENOUS | Status: AC
  Administered 2016-01-23: 10:00:00 via INTRAVENOUS

## 2016-01-23 MED ORDER — SODIUM CHLORIDE 0.9 % IV SOLN
2400.0000 mg/m2 | INTRAVENOUS | Status: DC
  Administered 2016-01-23: 6000 mg via INTRAVENOUS
  Filled 2016-01-23: qty 120

## 2016-01-23 MED ORDER — PALONOSETRON HCL INJECTION 0.25 MG/5ML
INTRAVENOUS | Status: AC
  Filled 2016-01-23: qty 5

## 2016-01-23 MED ORDER — LEUCOVORIN CALCIUM INJECTION 350 MG
400.0000 mg/m2 | Freq: Once | INTRAVENOUS | Status: AC
  Administered 2016-01-23: 996 mg via INTRAVENOUS
  Filled 2016-01-23: qty 49.8

## 2016-01-23 MED ORDER — OXALIPLATIN CHEMO INJECTION 100 MG/20ML
80.0000 mg/m2 | Freq: Once | INTRAVENOUS | Status: AC
  Administered 2016-01-23: 200 mg via INTRAVENOUS
  Filled 2016-01-23: qty 40

## 2016-01-23 MED ORDER — DEXAMETHASONE SODIUM PHOSPHATE 10 MG/ML IJ SOLN
10.0000 mg | Freq: Once | INTRAMUSCULAR | Status: AC
Start: 2016-01-23 — End: 2016-01-23
  Administered 2016-01-23: 10 mg via INTRAVENOUS

## 2016-01-23 MED ORDER — DEXAMETHASONE SODIUM PHOSPHATE 10 MG/ML IJ SOLN
INTRAMUSCULAR | Status: AC
Start: 2016-01-23 — End: 2016-01-23
  Filled 2016-01-23: qty 1

## 2016-01-23 MED ORDER — FLUOROURACIL CHEMO INJECTION 2.5 GM/50ML
400.0000 mg/m2 | Freq: Once | INTRAVENOUS | Status: AC
  Administered 2016-01-23: 1000 mg via INTRAVENOUS
  Filled 2016-01-23: qty 20

## 2016-01-23 NOTE — Progress Notes (Signed)
Hematology and Oncology Follow Up Visit  Jacob [name] [id number] [birth year] 71 y.o. 01/23/2016 9:35 AM Jacob Cedar, MD   Principle Diagnosis:   71 year old gentleman with:  1.Normocytic, normochromic anemia diagnosed March 2017. His anemia is due to renal insufficiency with creatinine of 2.9 creatinine clearance 44 mL/m.  2. Prostate cancer in 2013 with a Gleason score 3+4 = 7 and a PSA 5.66 stage TIc.  3. 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 His staging by EUS showed T3 N2 disease without any evidence of metastatic disease.   Prior Therapy:  He is status post radiation therapy for definitive treatment for his prostate cancer. Therapy concluded in December 2013. He is status post packed red cell transfusions in April 2017. He is status post bone marrow biopsy in April 2017. Results did not show any plasma cell disorder or myelodysplasia. He is status post IV iron infusion completed in April 2017. This was repeated in September 2017.   Current therapy:  FOLFOX chemotherapy with cycle 1 to be given on 12/12/2015.  He is here for cycle 4 of therapy.  Interim History:  Jacob [name] presents today for a follow-up visit. Since the last visit, he reports no major changes in his health. He is reporting some increased fatigue and tiredness associated with chemotherapy. He denied any nausea, vomiting or peripheral neuropathy. His appetite has remained unchanged and his weight is stable. He denied any abdominal pain or dyspepsia. He denied any hematochezia or melena. He denied any falls or syncope. He has not required any transfusion in the last 4 weeks.   He does not report any headaches, blurry vision, syncope or seizures. He does not report any fevers, chills, sweats or weight loss. Does not report any chest pain, palpitation, orthopnea or leg edema. He does not report any cough, wheezing or hemoptysis. He does not report any  frequency, urgency or hesitancy. He does not report any skeletal complaints of arthralgias or myalgias. Remaining review of systems unremarkable.   Medications: I have reviewed the patient's current medications.  Current Outpatient Prescriptions  Medication Sig Dispense Refill  . acetaminophen (TYLENOL) 500 MG tablet Take 1,000 mg by mouth daily as needed for moderate pain or headache.    . allopurinol (ZYLOPRIM) 300 MG tablet Take 300 mg by mouth daily.     Marland Kitchen buPROPion (WELLBUTRIN XL) 300 MG 24 hr tablet Take 300 mg by mouth daily.     . calcium carbonate (TUMS - DOSED IN MG ELEMENTAL CALCIUM) 500 MG chewable tablet Chew 3 tablets by mouth 2 (two) times daily as needed for indigestion or heartburn.    . diazepam (VALIUM) 5 MG tablet Take 5 mg by mouth at bedtime as needed (sleep).     . feeding supplement, GLUCERNA SHAKE, (GLUCERNA SHAKE) LIQD Take 237 mLs by mouth daily.     . furosemide (LASIX) 80 MG tablet Take 40 mg by mouth daily.     Marland Kitchen gabapentin (NEURONTIN) 800 MG tablet Take 400 mg by mouth at bedtime.     Marland Kitchen latanoprost (XALATAN) 0.005 % ophthalmic solution Place 1 drop into both eyes at bedtime.    . lidocaine-prilocaine (EMLA) cream Apply 1 application topically as needed. Apply to port before chemotherapy. 30 g 0  . Melatonin 10 MG TABS Take 1 tablet by mouth at bedtime as needed (sleep).     . Nebivolol HCl (BYSTOLIC) 20 MG TABS Take 20 mg by mouth daily.     Marland Kitchen omeprazole (PRILOSEC)  20 MG capsule Take 20 mg by mouth daily.    Marland Kitchen oxyCODONE (OXY IR/ROXICODONE) 5 MG immediate release tablet Take 1-2 tablets (5-10 mg total) by mouth every 6 (six) hours as needed for moderate pain, severe pain or breakthrough pain. 15 tablet 0  . OXYGEN Inhale 2 L into the lungs at bedtime as needed (for shortness of breath).    . prochlorperazine (COMPAZINE) 10 MG tablet Take 1 tablet (10 mg total) by mouth every 6 (six) hours as needed for nausea or vomiting. 30 tablet 0  . tamsulosin (FLOMAX) 0.4 MG  CAPS capsule TAKE 1 CAPSULE BY MOUTH DAILY 30 capsule 2  . traZODone (DESYREL) 50 MG tablet Take 50 mg by mouth at bedtime.     . Verapamil HCl CR 300 MG CP24 Take 300 mg by mouth at bedtime.      No current facility-administered medications for this visit.      Allergies:  No Known Allergies  Past Medical History, Surgical history, Social history, and Family History were reviewed and updated.   Physical Exam: Blood pressure (!) 147/81, pulse 78, temperature 98.3 F (36.8 C), temperature source Oral, resp. rate 18, height 6' 2" (1.88 m), weight 263 lb 4.8 oz (119.4 kg), SpO2 95 %. ECOG: 1 General appearance: Chronically ill-appearing gentleman appeared comfortable. Head: Normocephalic, without obvious abnormality no oral thrush noted. Neck: no adenopathy Lymph nodes: Cervical, supraclavicular, and axillary nodes normal. Heart:regular rate and rhythm, S1, S2 normal, no murmur, click, rub or gallop Lung:chest clear, no wheezing, rales, normal symmetric air entry Abdomin: soft, non-tender, without masses or organomegaly no rebound or guarding. EXT:no erythema, induration, or nodules   Lab Results: Lab Results  Component Value Date   WBC 4.0 01/23/2016   HGB 10.1 (L) 01/23/2016   HCT 31.0 (L) 01/23/2016   MCV 88.3 01/23/2016   PLT 97 (L) 01/23/2016     Chemistry      Component Value Date/Time   NA 140 01/09/2016 0817   K 4.7 01/09/2016 0817   CL 109 12/04/2015 0646   CO2 24 01/09/2016 0817   BUN 20.2 01/09/2016 0817   CREATININE 1.8 (H) 01/09/2016 0817      Component Value Date/Time   CALCIUM 9.8 01/09/2016 0817   ALKPHOS 49 01/09/2016 0817   AST 35 (H) 01/09/2016 0817   ALT 19 01/09/2016 0817   BILITOT 0.53 01/09/2016 0817          Impression and Plan:  71 year old woman with the following issues:  1. Normocytic, normochromic anemia presented initially with a hemoglobin of 9.2 now has drifted down to 6.0. His MCV is normal at 97 but does have an elevated  RDW. His anemia is related to renal insufficiency as well as iron deficiency.  His hemoglobin appears stable at this time and will require transfusion as needed to keep his hemoglobin above 10.  2. Antral mass that is biopsy proven to be adenocarcinoma. He is status post EUS and PET CT scan which confirmed a staging of about T3 N2 disease. No evidence of distant metastasis noted.   He is currently receiving FOLFOX and tolerated the first 3 cycles without complications.   The plan is to proceed with cycle 4 without any dose reduction or delay. He will have a staging workup after cycle 4 to determine the extent of his tumor and plan for curative surgical resection possibly after that.  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 His  last protein studies obtained in September 2017 showed no major changes and will be repeated annually.  4. Renal insufficiency: His creatinine remains stable at this time. no major changes noted in his kidney function.  5. Antiemetics: Prescription for Compazine is available to him. Nausea has not been an issue at this time.  6. IV access: Port-A-Cath is visualized without complications.  7. 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 Oxaliplatin will be given as long as his platelet count above 90000.  8. Follow-up: He follow-up in 2 weeks for cycle 5 of therapy if needed after repeat imaging studies.   [facility], MD 1/10/20189:35 AM

## 2016-01-23 NOTE — Telephone Encounter (Signed)
[facility] called to schedule PET scan appointment before 1/24 Follow up appointment. The only availability at the moment is on 1/25 at 10 am however, the manager for radiology, Kenney Houseman is working to schedule an appointment before 1/24 if possible.

## 2016-01-23 NOTE — Progress Notes (Signed)
OK to treat per MD [facility] with PLT and Creatinine values today

## 2016-01-23 NOTE — Telephone Encounter (Signed)
Called patient to inform him of New PET scan appointment Date and time for 1/15 @ 7AM. Informed patient that he cannot have anything at all food, beverages, gum, insulin/meds, etc. After midnight.

## 2016-01-23 NOTE — Progress Notes (Signed)
Nutrition follow-up completed with patient diagnosed with gastric cancer. Patient denies nutrition impact symptoms. Weight decreased slightly and documented as 263.3 pounds down from 267 pounds December 13. Continues to drink prune juice for constipation.  Nutrition diagnosis: Unintentional weight loss continues.  Intervention: Enforced the importance of small frequent meals and snacks with adequate calories and protein to maintain lean muscle mass. Recommended patient continue increased water consumption. Teach back method used.  Monitoring, evaluation, goals:  Patient will tolerate adequate calories and protein for weight maintenance/maintain lean body mass.  Next visit: Scheduled as needed.  **Disclaimer: This note was dictated with voice recognition software. Similar sounding words can inadvertently be transcribed and this note may contain transcription errors which may not have been corrected upon publication of note.**

## 2016-01-25 ENCOUNTER — Ambulatory Visit (HOSPITAL_BASED_OUTPATIENT_CLINIC_OR_DEPARTMENT_OTHER): Payer: Medicare Other

## 2016-01-25 VITALS — BP 138/86 | HR 66 | Temp 98.6°F | Resp 18

## 2016-01-25 DIAGNOSIS — D631 Anemia in chronic kidney disease: Secondary | ICD-10-CM | POA: Diagnosis not present

## 2016-01-25 DIAGNOSIS — Z452 Encounter for adjustment and management of vascular access device: Secondary | ICD-10-CM | POA: Diagnosis not present

## 2016-01-25 DIAGNOSIS — N189 Chronic kidney disease, unspecified: Secondary | ICD-10-CM

## 2016-01-25 MED ORDER — HEPARIN SOD (PORK) LOCK FLUSH 100 UNIT/ML IV SOLN
500.0000 [IU] | Freq: Once | INTRAVENOUS | Status: AC | PRN
  Administered 2016-01-25: 500 [IU]
  Filled 2016-01-25: qty 5

## 2016-01-25 MED ORDER — DARBEPOETIN ALFA 300 MCG/0.6ML IJ SOSY: 300.0000 ug | PREFILLED_SYRINGE | Freq: Once | INTRAMUSCULAR | Status: DC

## 2016-01-25 MED ORDER — SODIUM CHLORIDE 0.9 % IJ SOLN
10.0000 mL | INTRAMUSCULAR | Status: DC | PRN
  Administered 2016-01-25: 10 mL
  Filled 2016-01-25: qty 10

## 2016-01-25 NOTE — Patient Instructions (Signed)

## 2016-02-06 ENCOUNTER — Other Ambulatory Visit (HOSPITAL_BASED_OUTPATIENT_CLINIC_OR_DEPARTMENT_OTHER): Payer: Medicare Other

## 2016-02-06 ENCOUNTER — Telehealth: Payer: Self-pay | Admitting: Oncology

## 2016-02-06 ENCOUNTER — Ambulatory Visit (HOSPITAL_BASED_OUTPATIENT_CLINIC_OR_DEPARTMENT_OTHER): Payer: Medicare Other | Admitting: Oncology

## 2016-02-06 ENCOUNTER — Ambulatory Visit: Payer: Medicare Other

## 2016-02-06 ENCOUNTER — Encounter: Payer: Medicare Other | Admitting: Nutrition

## 2016-02-06 VITALS — BP 171/88 | HR 68 | Temp 99.6°F | Resp 18 | Ht 74.0 in | Wt 269.3 lb

## 2016-02-06 DIAGNOSIS — D6959 Other secondary thrombocytopenia: Secondary | ICD-10-CM

## 2016-02-06 DIAGNOSIS — C163 Malignant neoplasm of pyloric antrum: Secondary | ICD-10-CM

## 2016-02-06 DIAGNOSIS — N289 Disorder of kidney and ureter, unspecified: Secondary | ICD-10-CM

## 2016-02-06 DIAGNOSIS — C169 Malignant neoplasm of stomach, unspecified: Secondary | ICD-10-CM

## 2016-02-06 DIAGNOSIS — D509 Iron deficiency anemia, unspecified: Secondary | ICD-10-CM

## 2016-02-06 DIAGNOSIS — D649 Anemia, unspecified: Secondary | ICD-10-CM | POA: Diagnosis not present

## 2016-02-06 DIAGNOSIS — Z8546 Personal history of malignant neoplasm of prostate: Secondary | ICD-10-CM

## 2016-02-06 LAB — CBC WITH DIFFERENTIAL/PLATELET
BASO%: 0.4 % (ref 0.0–2.0)
BASOS ABS: 0 10*3/uL (ref 0.0–0.1)
EOS%: 2.9 % (ref 0.0–7.0)
Eosinophils Absolute: 0.1 10*3/uL (ref 0.0–0.5)
HCT: 29.4 % — ABNORMAL LOW (ref 38.4–49.9)
HGB: 9.5 g/dL — ABNORMAL LOW (ref 13.0–17.1)
LYMPH%: 23.5 % (ref 14.0–49.0)
MCH: 29.1 pg (ref 27.2–33.4)
MCHC: 32.3 g/dL (ref 32.0–36.0)
MCV: 90.2 fL (ref 79.3–98.0)
MONO#: 0.7 10*3/uL (ref 0.1–0.9)
MONO%: 14.9 % — AB (ref 0.0–14.0)
NEUT#: 2.7 10*3/uL (ref 1.5–6.5)
NEUT%: 58.3 % (ref 39.0–75.0)
Platelets: 60 10*3/uL — ABNORMAL LOW (ref 140–400)
RBC: 3.26 10*6/uL — ABNORMAL LOW (ref 4.20–5.82)
RDW: 19.8 % — AB (ref 11.0–14.6)
WBC: 4.6 10*3/uL (ref 4.0–10.3)
lymph#: 1.1 10*3/uL (ref 0.9–3.3)

## 2016-02-06 LAB — COMPREHENSIVE METABOLIC PANEL
ALT: 21 U/L (ref 0–55)
AST: 31 U/L (ref 5–34)
Albumin: 2.9 g/dL — ABNORMAL LOW (ref 3.5–5.0)
Alkaline Phosphatase: 49 U/L (ref 40–150)
Anion Gap: 9 mEq/L (ref 3–11)
BUN: 17.6 mg/dL (ref 7.0–26.0)
CHLORIDE: 111 meq/L — AB (ref 98–109)
CO2: 22 meq/L (ref 22–29)
Calcium: 10.3 mg/dL (ref 8.4–10.4)
Creatinine: 1.8 mg/dL — ABNORMAL HIGH (ref 0.7–1.3)
EGFR: 44 mL/min/{1.73_m2} — ABNORMAL LOW (ref >90)
GLUCOSE: 111 mg/dL (ref 70–140)
POTASSIUM: 4.6 meq/L (ref 3.5–5.1)
SODIUM: 142 meq/L (ref 136–145)
Total Bilirubin: 0.6 mg/dL (ref 0.20–1.20)
Total Protein: 6.4 g/dL (ref 6.4–8.3)

## 2016-02-06 NOTE — Telephone Encounter (Signed)
Gave patient avs report and appointments for February.  °

## 2016-02-06 NOTE — Progress Notes (Signed)
Hematology and Oncology Follow Up Visit  Jacob [name] [id number] 01-01-1946 71 y.o. 02/06/2016 8:57 AM Jacob Cedar, MD   Principle Diagnosis:   71 year old gentleman with:  1.Normocytic, normochromic anemia diagnosed March 2017. His anemia is due to renal insufficiency with creatinine of 2.9 creatinine clearance 44 mL/m.  2. Prostate cancer in 2013 with a Gleason score 3+4 = 7 and a PSA 5.66 stage TIc.  3. 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 His staging by EUS showed T3 N2 disease without any evidence of metastatic disease.   Prior Therapy:  He is status post radiation therapy for definitive treatment for his prostate cancer. Therapy concluded in December 2013. He is status post packed red cell transfusions in April 2017. He is status post bone marrow biopsy in April 2017. Results did not show any plasma cell disorder or myelodysplasia. He is status post IV iron infusion completed in April 2017. This was repeated in September 2017.   Current therapy:  FOLFOX chemotherapy with cycle 1 to be given on 12/12/2015.  He is here for cycle 5 of therapy.  Interim History:  Jacob [name] presents today for a follow-up visit. Since the last visit, he had any major complaints. He continues to tolerate chemotherapy without any major complications. He denied any nausea, vomiting or peripheral neuropathy. His appetite is improved and gained more weight. He denied any abdominal pain or dyspepsia. He denied any hematochezia or melena. He is still reporting some dizziness and unsteadiness and unable to ambulate long distances. Did not report any falls or syncope. He is limited in his mobility.   He does not report any headaches, blurry vision, syncope or seizures. He does not report any fevers, chills, sweats or weight loss. Does not report any chest pain, palpitation, orthopnea or leg edema. He does not report any cough, wheezing or  hemoptysis. He does not report any frequency, urgency or hesitancy. He does not report any skeletal complaints of arthralgias or myalgias. Remaining review of systems unremarkable.   Medications: I have reviewed the patient's current medications.  Current Outpatient Prescriptions  Medication Sig Dispense Refill  . acetaminophen (TYLENOL) 500 MG tablet Take 1,000 mg by mouth daily as needed for moderate pain or headache.    . allopurinol (ZYLOPRIM) 300 MG tablet Take 300 mg by mouth daily.     Marland Kitchen buPROPion (WELLBUTRIN XL) 300 MG 24 hr tablet Take 300 mg by mouth daily.     . calcium carbonate (TUMS - DOSED IN MG ELEMENTAL CALCIUM) 500 MG chewable tablet Chew 3 tablets by mouth 2 (two) times daily as needed for indigestion or heartburn.    . diazepam (VALIUM) 5 MG tablet Take 5 mg by mouth at bedtime as needed (sleep).     . feeding supplement, GLUCERNA SHAKE, (GLUCERNA SHAKE) LIQD Take 237 mLs by mouth daily.     . furosemide (LASIX) 80 MG tablet Take 40 mg by mouth daily.     Marland Kitchen gabapentin (NEURONTIN) 800 MG tablet Take 400 mg by mouth at bedtime.     Marland Kitchen latanoprost (XALATAN) 0.005 % ophthalmic solution Place 1 drop into both eyes at bedtime.    . lidocaine-prilocaine (EMLA) cream Apply 1 application topically as needed. Apply to port before chemotherapy. 30 g 0  . Melatonin 10 MG TABS Take 1 tablet by mouth at bedtime as needed (sleep).     . Nebivolol HCl (BYSTOLIC) 20 MG TABS Take 20 mg by mouth daily.     Marland Kitchen  omeprazole (PRILOSEC) 20 MG capsule Take 20 mg by mouth daily.    Marland Kitchen oxyCODONE (OXY IR/ROXICODONE) 5 MG immediate release tablet Take 1-2 tablets (5-10 mg total) by mouth every 6 (six) hours as needed for moderate pain, severe pain or breakthrough pain. 15 tablet 0  . OXYGEN Inhale 2 L into the lungs at bedtime as needed (for shortness of breath).    . prochlorperazine (COMPAZINE) 10 MG tablet Take 1 tablet (10 mg total) by mouth every 6 (six) hours as needed for nausea or vomiting. 30 tablet  0  . tamsulosin (FLOMAX) 0.4 MG CAPS capsule TAKE 1 CAPSULE BY MOUTH DAILY 30 capsule 2  . traZODone (DESYREL) 50 MG tablet Take 50 mg by mouth at bedtime.     . Verapamil HCl CR 300 MG CP24 Take 300 mg by mouth at bedtime.      No current facility-administered medications for this visit.      Allergies:  No Known Allergies  Past Medical History, Surgical history, Social history, and Family History were reviewed and updated.   Physical Exam: Blood pressure (!) 171/88, pulse 68, temperature 99.6 F (37.6 C), temperature source Oral, resp. rate 18, height 6' 2" (1.88 m), weight 269 lb 4.8 oz (122.2 kg), SpO2 94 %. ECOG: 1 General appearance: Alert, awake comfortable gentleman appeared without distress. Head: Normocephalic, without obvious abnormality no oral ulcers or lesions. Neck: no adenopathy Lymph nodes: Cervical, supraclavicular, and axillary nodes normal. Heart:regular rate and rhythm, S1, S2 normal, no murmur, click, rub or gallop Lung:chest clear, no wheezing, rales, normal symmetric air entry Abdomin: soft, non-tender, without masses or organomegaly no shifting dullness or ascites. EXT:no erythema, induration, or nodules   Lab Results: Lab Results  Component Value Date   WBC 4.6 02/06/2016   HGB 9.5 (L) 02/06/2016   HCT 29.4 (L) 02/06/2016   MCV 90.2 02/06/2016   PLT 60 (L) 02/06/2016     Chemistry      Component Value Date/Time   NA 140 01/23/2016 0905   K 4.5 01/23/2016 0905   CL 109 12/04/2015 0646   CO2 23 01/23/2016 0905   BUN 16.5 01/23/2016 0905   CREATININE 1.8 (H) 01/23/2016 0905      Component Value Date/Time   CALCIUM 9.9 01/23/2016 0905   ALKPHOS 49 01/23/2016 0905   AST 33 01/23/2016 0905   ALT 23 01/23/2016 0905   BILITOT 0.55 01/23/2016 0905          Impression and Plan:  71 year old woman with the following issues:  1. Normocytic, normochromic anemia presented initially with a hemoglobin of 9.2 now has drifted down to 6.0. His  MCV is normal at 97 but does have an elevated RDW. His anemia is related to renal insufficiency as well as iron deficiency.  His hemoglobin Continues to be close to 10 and does not require transfusion at this time.  2. Antral mass that is biopsy proven to be adenocarcinoma. He is status post EUS and PET CT scan which confirmed a staging of about T3 N2 disease. No evidence of distant metastasis noted.   He is currently receiving FOLFOX and tolerated the first 4 cycles without complications.   The plan is to obtain PET scan which is scheduled on 02/07/2016 and determine whether more chemotherapy is needed. If his tumor is adequately controls, primary surgical therapy might be his next up.  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 His last protein studies obtained  in September 2017 showed no major changes and will be repeated annually.  4. Renal insufficiency: His creatinine remains stable at this time. no major changes noted in his kidney function.  5. Antiemetics: Prescription for Compazine is available to him. No reported issues since last visit.  6. IV access: Port-A-Cath is visualized without complications.  7. 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 Chemotherapy will be withheld today given his thrombocytopenia..  8. Follow-up: He follow-up in 2 weeks for cycle 5 of therapy if needed after repeat imaging studies.   ,, MD 1/24/20188:57 AM 

## 2016-02-07 ENCOUNTER — Ambulatory Visit (HOSPITAL_COMMUNITY)
Admission: RE | Admit: 2016-02-07 | Discharge: 2016-02-07 | Disposition: A | Discharge status: Home / Self Care | Payer: Medicare Other | Source: Ambulatory Visit | Attending: Oncology | Admitting: Oncology

## 2016-02-07 DIAGNOSIS — C169 Malignant neoplasm of stomach, unspecified: Secondary | ICD-10-CM | POA: Diagnosis present

## 2016-02-07 DIAGNOSIS — N281 Cyst of kidney, acquired: Secondary | ICD-10-CM | POA: Insufficient documentation

## 2016-02-07 DIAGNOSIS — J320 Chronic maxillary sinusitis: Secondary | ICD-10-CM | POA: Insufficient documentation

## 2016-02-07 DIAGNOSIS — I700 Atherosclerosis of aorta: Secondary | ICD-10-CM | POA: Diagnosis not present

## 2016-02-07 DIAGNOSIS — J323 Chronic sphenoidal sinusitis: Secondary | ICD-10-CM | POA: Diagnosis not present

## 2016-02-07 DIAGNOSIS — I517 Cardiomegaly: Secondary | ICD-10-CM | POA: Insufficient documentation

## 2016-02-07 LAB — GLUCOSE, CAPILLARY: Glucose-Capillary: 109 mg/dL — ABNORMAL HIGH (ref 65–99)

## 2016-02-07 IMAGING — CT NM PET TUM IMG RESTAG (PS) SKULL BASE T - THIGH
8 series · 25 of 25 positions shown · non-contrast
Comparison: [DATE]

CLINICAL DATA: Subsequent treatment strategy for gastric
adenocarcinoma.

EXAM:
NUCLEAR MEDICINE PET SKULL BASE TO THIGH
TECHNIQUE: 13.5 mCi F-18 FDG was injected intravenously. Full-ring PET imaging
was performed from the skull base to thigh after the radiotracer. CT
data was obtained and used for attenuation correction and anatomic
localization.
FASTING BLOOD GLUCOSE:  Value: 109 mg/dl

[Series 3: pet sk_thigh ac · axial · 5.0mm · 4.07mm/px · z∈[-838,+86]mm · 6 of 232 slices shown]
[im 1/232]
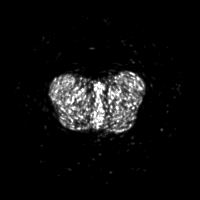
[im 47/232]
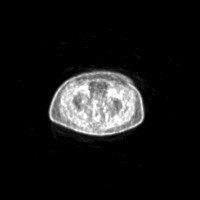
[im 93/232]
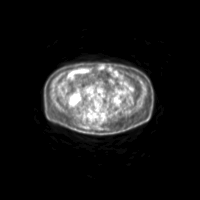
[im 139/232]
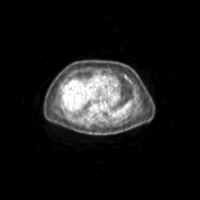
[im 185/232]
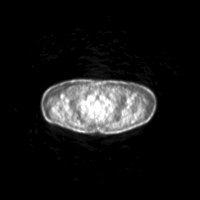
[im 232/232]
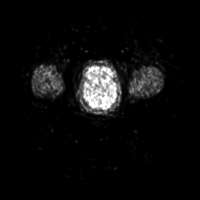

[Series 4: ct sk_thigh 5.0 b31f · axial · 5.0mm · 0.98mm/px · z∈[-838,+86]mm · 5 of 232 slices shown]
[im 1/232]
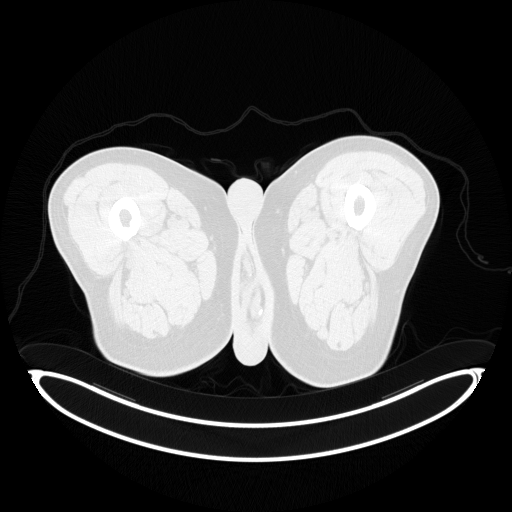
[im 58/232]
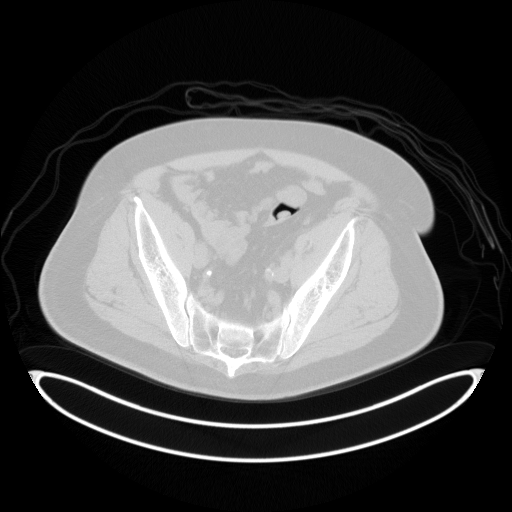
[im 116/232]
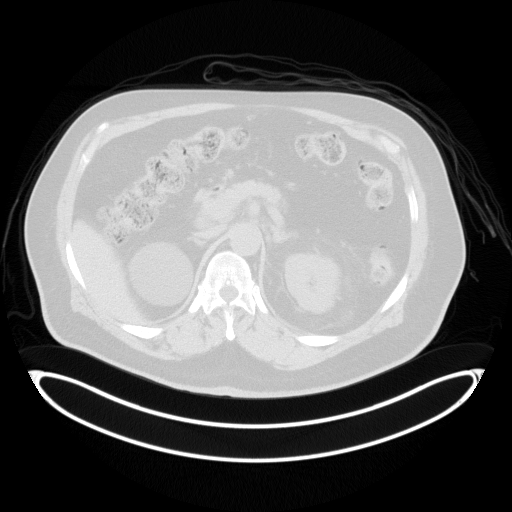
[im 174/232]
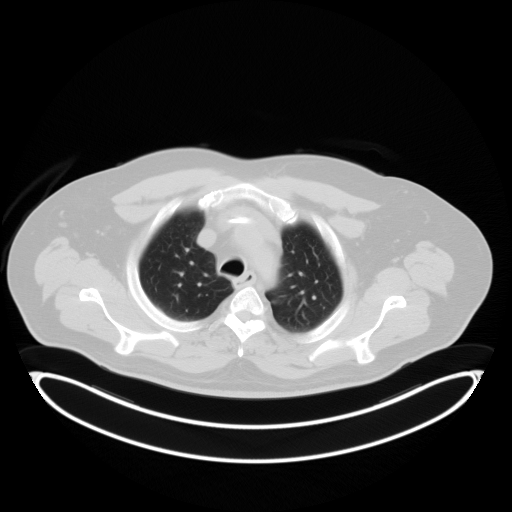
[im 232/232  brain]
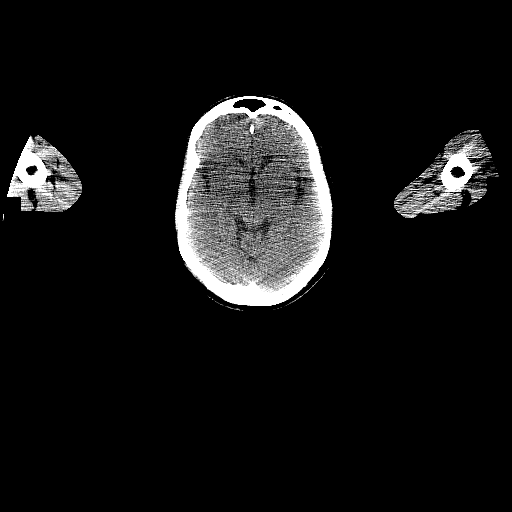

[Series 7: pet sk_thigh nac · axial · 5.0mm · 4.07mm/px · z∈[-838,+86]mm · 5 of 232 slices shown]
[im 1/232]
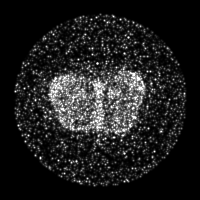
[im 58/232]
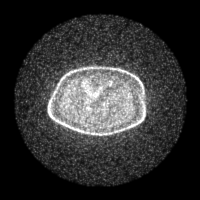
[im 116/232]
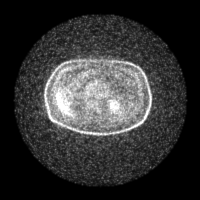
[im 174/232]
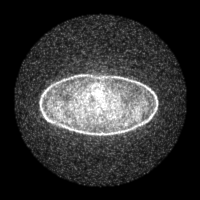
[im 232/232]
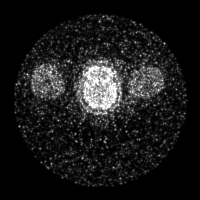

[Series 8: ct sk_thigh 5.0 b70f (id)_bone · axial · 5.0mm · 0.64mm/px · 1 of 59 slices shown]
[im 1/59  bone]
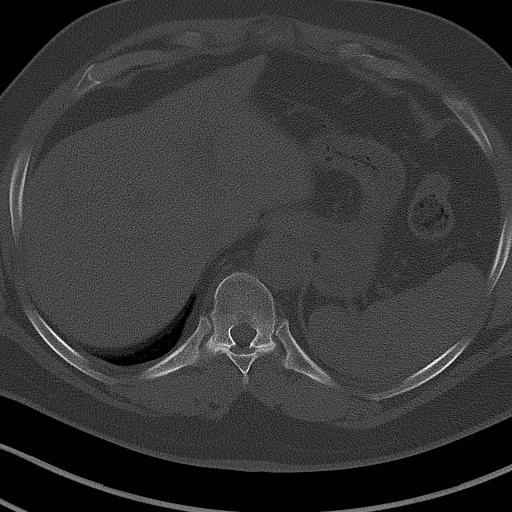

[Series 604: range-ct sk_thigh 5.0 (id)<alpha range> · 1 of 56 slices shown (1 of 2)]
[im 1/56]
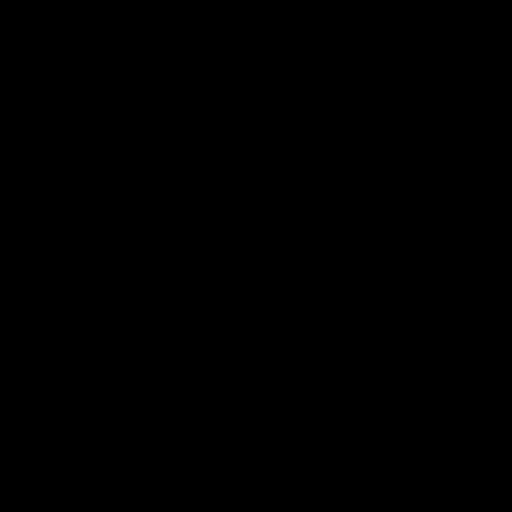

[Series 605: mip collection · coronal · 1.92mm/px · 1 of 32 slices shown]
[im 1/32]
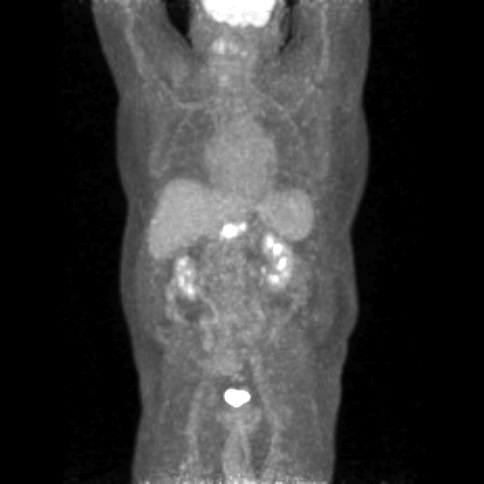

[Series 606: range-ct sk_thigh 5.0 (id)<alpha range> · 5 of 223 slices shown (2 of 2)]
[im 1/223]
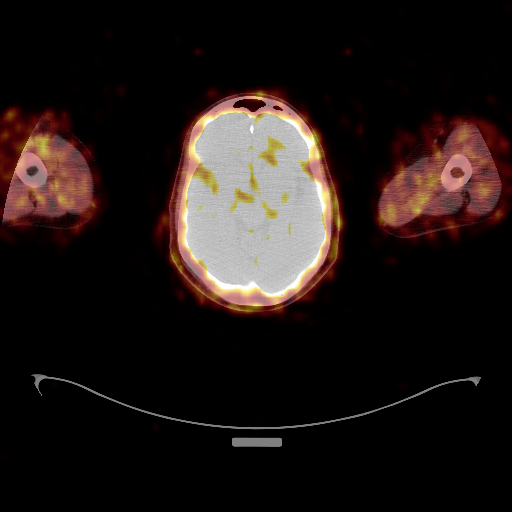
[im 56/223]
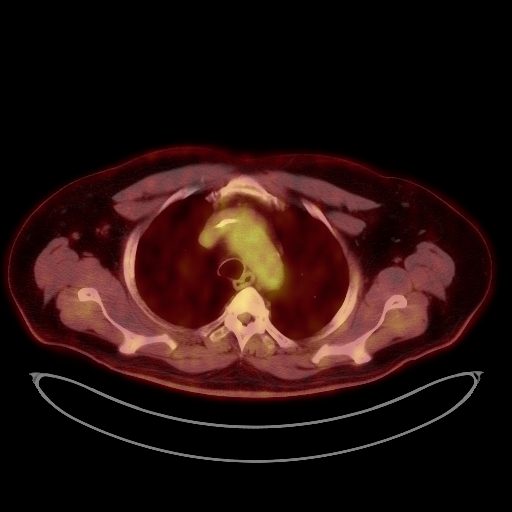
[im 112/223]
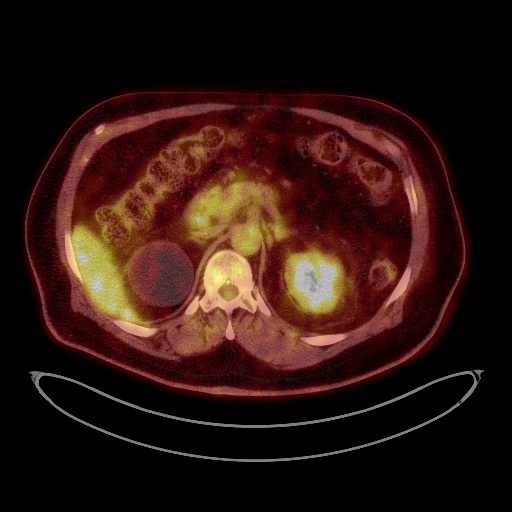
[im 167/223]
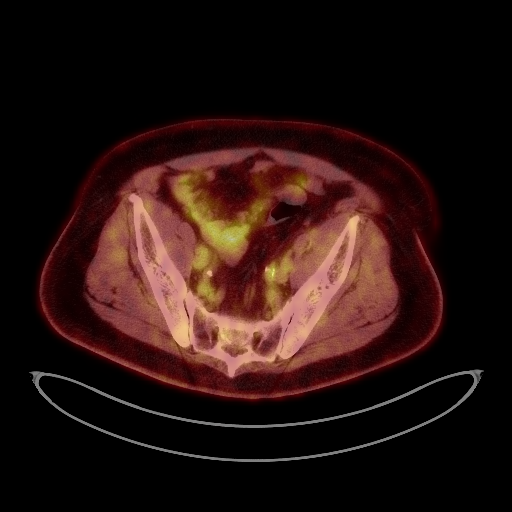
[im 223/223]
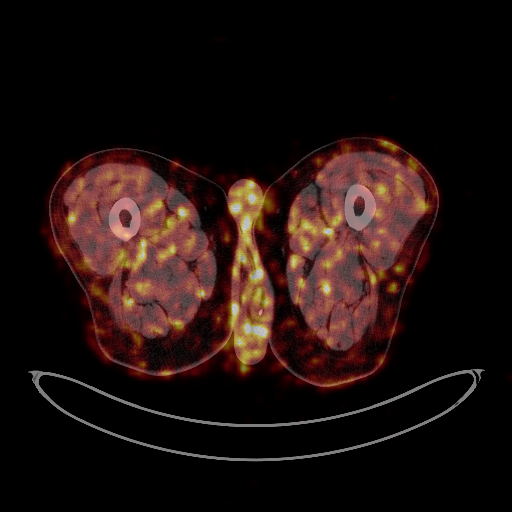

[Series 1079: results mm oncology reading · 5.0mm · 1.04mm/px · 1 of 5 slices shown]
[im 1/5]
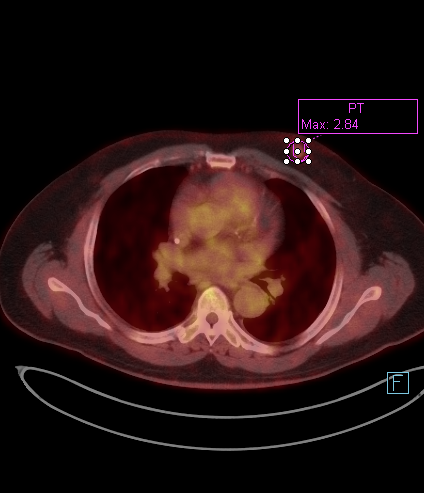

[25 of 25 positions shown; findings below may reference images not displayed]

FINDINGS: NECK

No hypermetabolic lymph nodes in the neck.

Chronic bilateral maxillary and bilateral sphenoid sinusitis.

CHEST

No hypermetabolic mediastinal or hilar nodes. No suspicious
pulmonary nodules on the CT scan.

Coronary and aortic arch atherosclerotic calcification. Stable
by 1.6 cm structure (image 75/4) in the subcutaneous tissues the
left breast is faintly metabolic with maximum SUV 2.8, likely a
sebaceous cyst or similar benign lesion. Mild cardiomegaly.

ABDOMEN/PELVIS

The hypermetabolic activity in the stomach associated with the
antral mass is significantly reduced in size compared to the prior
exam, currently about 5.7 by 3.0 by 2.6 cm (volume = 23 cm^3) and
with maximum SUV 27.7, previously when measured in the same fashion
this measures 11.4 by 6.5 by 4.4 cm (volume = 170 cm^3) and with
maximum SUV 49.7. And indistinct portacaval node measures about
cm in short axis on image 116/4, previously 1.4 cm, and not
currently hypermetabolic.

Photopenic right kidney upper pole cyst.

Aortoiliac atherosclerotic vascular disease. Fiducials are noted
along the margins of the prostate gland.

There is a rim calcified lesion posteriorly in the right mid kidney
shown on image [DATE] corresponding hypermetabolic activity, this
lesion measures about 1.7 cm in diameter based on the calcification.
Not appreciably changed from prior and not changed from [DATE],
previously classified Bosniak category 2.

SKELETON

No focal hypermetabolic activity to suggest skeletal metastasis.
IMPRESSION: 1. Reduction in size and activity associated with the mass of the
gastric antrum. Size measurements are obtained by the region of
abnormal SUV, rather than a direct anatomic measurement of the
lesion, and as such may not be highly accurate, but seen to indicate
that the lesion measures about 14% of the volume that it previously
measured. SUV decreased from 49.7 to 27.7.
2. No regional adenopathy identified.
3. Other imaging findings of potential clinical significance:
Chronic bilateral maxillary and sphenoid sinusitis. Coronary and
aortic arch atherosclerosis. Right renal cysts including a Bosniak
category 2 cyst. Aortoiliac atherosclerotic vascular disease. Mild
cardiomegaly.
4. Stable small nodular structure in the subcutaneous tissues the
left breast is faintly metabolic with maximum SUV 2.8, likely a
sebaceous cyst or similar benign lesion.

## 2016-02-07 MED ORDER — FLUDEOXYGLUCOSE F - 18 (FDG) INJECTION
13.5000 | Freq: Once | INTRAVENOUS | Status: AC | PRN
  Administered 2016-02-07: 13.5 via INTRAVENOUS

## 2016-02-14 ENCOUNTER — Ambulatory Visit (HOSPITAL_COMMUNITY)
Admission: RE | Admit: 2016-02-14 | Discharge: 2016-02-14 | Disposition: A | Discharge status: Home / Self Care | Payer: Medicare Other | Source: Ambulatory Visit | Attending: Oncology | Admitting: Oncology

## 2016-02-20 ENCOUNTER — Other Ambulatory Visit (HOSPITAL_BASED_OUTPATIENT_CLINIC_OR_DEPARTMENT_OTHER): Payer: Medicare Other

## 2016-02-20 ENCOUNTER — Telehealth: Payer: Self-pay | Admitting: *Deleted

## 2016-02-20 ENCOUNTER — Ambulatory Visit (HOSPITAL_BASED_OUTPATIENT_CLINIC_OR_DEPARTMENT_OTHER): Payer: Medicare Other

## 2016-02-20 ENCOUNTER — Ambulatory Visit (HOSPITAL_BASED_OUTPATIENT_CLINIC_OR_DEPARTMENT_OTHER): Payer: Medicare Other | Admitting: Oncology

## 2016-02-20 VITALS — BP 143/79 | HR 72 | Temp 98.4°F | Resp 18 | Ht 74.0 in | Wt 274.7 lb

## 2016-02-20 DIAGNOSIS — Z5111 Encounter for antineoplastic chemotherapy: Secondary | ICD-10-CM

## 2016-02-20 DIAGNOSIS — D649 Anemia, unspecified: Secondary | ICD-10-CM

## 2016-02-20 DIAGNOSIS — D509 Iron deficiency anemia, unspecified: Secondary | ICD-10-CM | POA: Diagnosis not present

## 2016-02-20 DIAGNOSIS — C169 Malignant neoplasm of stomach, unspecified: Secondary | ICD-10-CM

## 2016-02-20 DIAGNOSIS — C163 Malignant neoplasm of pyloric antrum: Secondary | ICD-10-CM | POA: Diagnosis not present

## 2016-02-20 DIAGNOSIS — N289 Disorder of kidney and ureter, unspecified: Secondary | ICD-10-CM

## 2016-02-20 LAB — COMPREHENSIVE METABOLIC PANEL
ALK PHOS: 46 U/L (ref 40–150)
ALT: 20 U/L (ref 0–55)
ANION GAP: 6 meq/L (ref 3–11)
AST: 30 U/L (ref 5–34)
Albumin: 2.9 g/dL — ABNORMAL LOW (ref 3.5–5.0)
BUN: 20.5 mg/dL (ref 7.0–26.0)
CO2: 25 mEq/L (ref 22–29)
CREATININE: 1.7 mg/dL — AB (ref 0.7–1.3)
Calcium: 9.6 mg/dL (ref 8.4–10.4)
Chloride: 107 mEq/L (ref 98–109)
EGFR: 46 mL/min/{1.73_m2} — AB (ref >90)
Glucose: 92 mg/dl (ref 70–140)
Potassium: 4.7 mEq/L (ref 3.5–5.1)
SODIUM: 139 meq/L (ref 136–145)
Total Bilirubin: 0.6 mg/dL (ref 0.20–1.20)
Total Protein: 6.9 g/dL (ref 6.4–8.3)

## 2016-02-20 LAB — CBC WITH DIFFERENTIAL/PLATELET
BASO%: 1.2 % (ref 0.0–2.0)
Basophils Absolute: 0.1 10*3/uL (ref 0.0–0.1)
EOS ABS: 0.3 10*3/uL (ref 0.0–0.5)
EOS%: 3.8 % (ref 0.0–7.0)
HCT: 28.5 % — ABNORMAL LOW (ref 38.4–49.9)
HGB: 9.4 g/dL — ABNORMAL LOW (ref 13.0–17.1)
LYMPH%: 13.8 % — AB (ref 14.0–49.0)
MCH: 31.2 pg (ref 27.2–33.4)
MCHC: 32.9 g/dL (ref 32.0–36.0)
MCV: 94.8 fL (ref 79.3–98.0)
MONO#: 0.8 10*3/uL (ref 0.1–0.9)
MONO%: 11.8 % (ref 0.0–14.0)
NEUT#: 4.5 10*3/uL (ref 1.5–6.5)
NEUT%: 69.4 % (ref 39.0–75.0)
PLATELETS: 131 10*3/uL — AB (ref 140–400)
RBC: 3.01 10*6/uL — AB (ref 4.20–5.82)
RDW: 26.2 % — ABNORMAL HIGH (ref 11.0–14.6)
WBC: 6.5 10*3/uL (ref 4.0–10.3)
lymph#: 0.9 10*3/uL (ref 0.9–3.3)

## 2016-02-20 MED ORDER — SODIUM CHLORIDE 0.9 % IV SOLN
2400.0000 mg/m2 | INTRAVENOUS | Status: DC
  Administered 2016-02-20: 6000 mg via INTRAVENOUS
  Filled 2016-02-20: qty 120

## 2016-02-20 MED ORDER — DEXAMETHASONE SODIUM PHOSPHATE 10 MG/ML IJ SOLN
INTRAMUSCULAR | Status: AC
  Filled 2016-02-20: qty 1

## 2016-02-20 MED ORDER — OXALIPLATIN CHEMO INJECTION 100 MG/20ML
81.0000 mg/m2 | Freq: Once | INTRAVENOUS | Status: AC
  Administered 2016-02-20: 200 mg via INTRAVENOUS
  Filled 2016-02-20: qty 40

## 2016-02-20 MED ORDER — DEXAMETHASONE SODIUM PHOSPHATE 10 MG/ML IJ SOLN
10.0000 mg | Freq: Once | INTRAMUSCULAR | Status: AC
  Administered 2016-02-20: 10 mg via INTRAVENOUS

## 2016-02-20 MED ORDER — DEXTROSE 5 % IV SOLN
Freq: Once | INTRAVENOUS | Status: AC
  Administered 2016-02-20: 11:00:00 via INTRAVENOUS

## 2016-02-20 MED ORDER — PALONOSETRON HCL INJECTION 0.25 MG/5ML
0.2500 mg | Freq: Once | INTRAVENOUS | Status: AC
  Administered 2016-02-20: 0.25 mg via INTRAVENOUS

## 2016-02-20 MED ORDER — FLUOROURACIL CHEMO INJECTION 2.5 GM/50ML
400.0000 mg/m2 | Freq: Once | INTRAVENOUS | Status: AC
  Administered 2016-02-20: 1000 mg via INTRAVENOUS
  Filled 2016-02-20: qty 20

## 2016-02-20 MED ORDER — LEUCOVORIN CALCIUM INJECTION 350 MG
400.0000 mg/m2 | Freq: Once | INTRAVENOUS | Status: AC
  Administered 2016-02-20: 996 mg via INTRAVENOUS
  Filled 2016-02-20: qty 49.8

## 2016-02-20 MED ORDER — PALONOSETRON HCL INJECTION 0.25 MG/5ML
INTRAVENOUS | Status: AC
  Filled 2016-02-20: qty 5

## 2016-02-20 NOTE — Progress Notes (Signed)
Hematology and Oncology Follow Up Visit  Jacob [name] [id number] 03-31-1945 71 y.o. 02/20/2016 9:25 AM [name], MD   Principle Diagnosis:   71 year old gentleman with:  1.Normocytic, normochromic anemia diagnosed March 2017. His anemia is due to renal insufficiency with creatinine of 2.9 creatinine clearance 44 mL/m.  2. Prostate cancer in 2013 with a Gleason score 3+4 = 7 and a PSA 5.66 stage TIc.  3. 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 His staging by EUS showed T3 N2 disease without any evidence of metastatic disease.   Prior Therapy:  He is status post radiation therapy for definitive treatment for his prostate cancer. Therapy concluded in December 2013. He is status post packed red cell transfusions in April 2017. He is status post bone marrow biopsy in April 2017. Results did not show any plasma cell disorder or myelodysplasia. He is status post IV iron infusion completed in April 2017. This was repeated in September 2017.   Current therapy:  FOLFOX chemotherapy with cycle 1 to be given on 12/12/2015.  He is here for cycle 5 of therapy.  Interim History:  [name] presents today for a follow-up visit. Since the last visit, his chemotherapy cycle was delayed because of thrombocytopenia. He denied any bleeding including hematochezia or melena. He continues to tolerate chemotherapy on previous occasions without complications. He denied any nausea, vomiting or peripheral neuropathy. His appetite is improved and gained more weight. He denied any abdominal pain or dyspepsia. He denied any hematochezia or melena. He is still reporting some dizziness and unsteadiness and unable to ambulate long distances. He still not able to drive and was brought in today by his son.   He does not report any headaches, blurry vision, syncope or seizures. He does not report any fevers, chills, sweats or weight loss. Does not report any  chest pain, palpitation, orthopnea or leg edema. He does not report any cough, wheezing or hemoptysis. He does not report any frequency, urgency or hesitancy. He does not report any skeletal complaints of arthralgias or myalgias. Remaining review of systems unremarkable.   Medications: I have reviewed the patient's current medications.  Current Outpatient Prescriptions  Medication Sig Dispense Refill  . acetaminophen (TYLENOL) 500 MG tablet Take 1,000 mg by mouth daily as needed for moderate pain or headache.    . allopurinol (ZYLOPRIM) 300 MG tablet Take 300 mg by mouth daily.     Marland Kitchen buPROPion (WELLBUTRIN XL) 300 MG 24 hr tablet Take 300 mg by mouth daily.     . calcium carbonate (TUMS - DOSED IN MG ELEMENTAL CALCIUM) 500 MG chewable tablet Chew 3 tablets by mouth 2 (two) times daily as needed for indigestion or heartburn.    . diazepam (VALIUM) 5 MG tablet Take 5 mg by mouth at bedtime as needed (sleep).     . feeding supplement, GLUCERNA SHAKE, (GLUCERNA SHAKE) LIQD Take 237 mLs by mouth daily.     . furosemide (LASIX) 80 MG tablet Take 40 mg by mouth daily.     Marland Kitchen gabapentin (NEURONTIN) 800 MG tablet Take 400 mg by mouth at bedtime.     Marland Kitchen latanoprost (XALATAN) 0.005 % ophthalmic solution Place 1 drop into both eyes at bedtime.    . lidocaine-prilocaine (EMLA) cream Apply 1 application topically as needed. Apply to port before chemotherapy. 30 g 0  . Melatonin 10 MG TABS Take 1 tablet by mouth at bedtime as needed (sleep).     . Nebivolol HCl (BYSTOLIC)  20 MG TABS Take 20 mg by mouth daily.     Marland Kitchen omeprazole (PRILOSEC) 20 MG capsule Take 20 mg by mouth daily.    Marland Kitchen oxyCODONE (OXY IR/ROXICODONE) 5 MG immediate release tablet Take 1-2 tablets (5-10 mg total) by mouth every 6 (six) hours as needed for moderate pain, severe pain or breakthrough pain. 15 tablet 0  . OXYGEN Inhale 2 L into the lungs at bedtime as needed (for shortness of breath).    . prochlorperazine (COMPAZINE) 10 MG tablet Take 1  tablet (10 mg total) by mouth every 6 (six) hours as needed for nausea or vomiting. 30 tablet 0  . tamsulosin (FLOMAX) 0.4 MG CAPS capsule TAKE 1 CAPSULE BY MOUTH DAILY 30 capsule 2  . traZODone (DESYREL) 50 MG tablet Take 50 mg by mouth at bedtime.     . Verapamil HCl CR 300 MG CP24 Take 300 mg by mouth at bedtime.      No current facility-administered medications for this visit.      Allergies:  No Known Allergies  Past Medical History, Surgical history, Social history, and Family History were reviewed and updated.   Physical Exam: Blood pressure (!) 143/79, pulse 72, temperature 98.4 F (36.9 C), temperature source Oral, resp. rate 18, height _0  (1.88 m), weight 274 lb 11.2 oz (124.6 kg), SpO2 97 %. ECOG: 1 General appearance: Well-appearing gentleman appeared without distress. Head: Normocephalic, without obvious abnormality no oral thrush noted. Neck: no adenopathy Lymph nodes: Cervical, supraclavicular, and axillary nodes normal. Heart:regular rate and rhythm, S1, S2 normal, no murmur, click, rub or gallop Lung:chest clear, no wheezing, rales, normal symmetric air entry Abdomin: soft, non-tender, without masses or organomegaly no rebound or guarding. EXT:no erythema, induration, or nodules   Lab Results: Lab Results  Component Value Date   WBC 6.5 02/20/2016   HGB 9.4 (L) 02/20/2016   HCT 28.5 (L) 02/20/2016   MCV 94.8 02/20/2016   PLT 131 (L) 02/20/2016     Chemistry      Component Value Date/Time   NA 142 02/06/2016 0824   K 4.6 02/06/2016 0824   CL 109 12/04/2015 0646   CO2 22 02/06/2016 0824   BUN 17.6 02/06/2016 0824   CREATININE 1.8 (H) 02/06/2016 0824      Component Value Date/Time   CALCIUM 10.3 02/06/2016 0824   ALKPHOS 49 02/06/2016 0824   AST 31 02/06/2016 0824   ALT 21 02/06/2016 0824   BILITOT 0.60 02/06/2016 0824      EXAM: CT CHEST, ABDOMEN AND PELVIS WITHOUT CONTRAST  TECHNIQUE: Multidetector CT imaging of the chest, abdomen and  pelvis was performed following the standard protocol without IV contrast.  COMPARISON:  CT abdomen pelvis 06/07/2014  FINDINGS: CT CHEST FINDINGS  Cardiovascular: Normal heart size. Coronary artery vascular calcifications. Low attenuation of the blood pool suggestive of anemia.  Mediastinum/Nodes: No enlarged axillary, mediastinal or hilar lymphadenopathy. Esophagus is unremarkable.  Lungs/Pleura: Central airways are patent. Minimal atelectasis within the lower lobes bilaterally. No large area pulmonary consolidation. No pleural effusion or pneumothorax.  Musculoskeletal: No aggressive or acute appearing osseous lesions. There is a nonspecific 1.6 cm rounded mass within the subcutaneous fat overlying the left chest wall (image 32; series 2).  CT ABDOMEN PELVIS FINDINGS  Hepatobiliary: Liver is normal in size and contour. Gallbladder is unremarkable.  Pancreas: Unremarkable  Spleen: Unremarkable  Adrenals/Urinary Tract: Normal adrenal glands. Grossly unchanged bilateral perinephric fat stranding. Unchanged 6.2 cm cyst within the superior pole of the right  kidney. Unchanged low-attenuation lesion within the interpolar region of the right kidney with smooth rim calcification. No hydronephrosis. No nephroureterolithiasis. Urinary bladder is unremarkable.  Stomach/Bowel: Descending and sigmoid colonic diverticulosis. No CT evidence for acute diverticulitis. The appendix is normal. There is suggestion of circumferential wall thickening of the gastric antrum without surrounding fat stranding. No evidence for bowel obstruction.  Vascular/Lymphatic: Infrarenal abdominal aortic ectasia measuring 2.9 cm. Peripheral calcified atherosclerotic plaque of the abdominal aorta. No retroperitoneal lymphadenopathy.  Reproductive: Prostate unremarkable.  Other: Left-greater-than-right bilateral fat containing inguinal hernias.  Musculoskeletal: Lumbar spine  degenerative changes. No aggressive or acute appearing osseous lesions.  IMPRESSION: Circumferential wall thickening of the gastric antrum which is nonspecific and may be secondary to an infectious/inflammatory process or gastric malignancy/mass. Recommend correlation with upper endoscopy.  Nonspecific mass within the subcutaneous fat overlying the left chest wall. Recommend dedicated evaluation physical exam and ultrasound as clinically indicated.  Aortic atherosclerosis.  These results will be called to the ordering clinician or representative by the Radiologist Assistant, and communication documented in the PACS or zVision Dashboard.    Impression and Plan:  71 year old woman with the following issues:  1. Normocytic, normochromic anemia presented initially with a hemoglobin of 9.2 now has drifted down to 6.0. His MCV is normal at 97 but does have an elevated RDW. His anemia is related to renal insufficiency as well as iron deficiency.  His hemoglobin is close to 10 and does not require transfusion at this time.  2. Antral mass that is biopsy proven to be adenocarcinoma. He is status post EUS and PET CT scan which confirmed a staging of about T3 N2 disease. No evidence of distant metastasis noted.   He is currently receiving FOLFOX and tolerated the first 4 cycles without complications.   PET CT scan obtained on 02/07/2016 which showed excellent response to therapy but clearly with residual tumor. The plan is to proceed with the next cycle of chemotherapy at this time. I will refer him back to Dr. Barry Dienes for evaluation for primary surgical therapy at this time. He has tolerated chemotherapy reasonably well but she is started to develop cumulative toxicities including thrombocytopenia and required delayed chemotherapy.  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 His last protein studies obtained in September 2017  showed no major changes and will be repeated annually.  4. Renal insufficiency: His creatinine remains stable at this time. no major changes noted in his kidney function.  5. Antiemetics: Prescription for Compazine is available to him. Nonetheless reported since last visit.  6. IV access: Port-A-Cath is visualized without complications.  7. 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 Platelet count recovered after withholding chemotherapy.  8. Follow-up: He follow-up in 3 weeks to recheck his hemoglobin and transfuse as needed.   [facility], MD 2/7/20189:25 AM

## 2016-02-20 NOTE — Telephone Encounter (Signed)
Per 2/7 LOS and staff message I have scheduled appt and gave calendar. Notified the scheduler 

## 2016-02-20 NOTE — Progress Notes (Signed)
Per Dr. Hazeline Junker office note,  Creatinine/renal function stable at this time.

## 2016-02-20 NOTE — Patient Instructions (Signed)
[facility] Discharge Instructions for Patients Receiving Chemotherapy  Today you received the following chemotherapy agents oxaliplatin/leucovorin/florouracil   To help prevent nausea and vomiting after your treatment, we encourage you to take your nausea medication as directed.   If you develop nausea and vomiting that is not controlled by your nausea medication, call the clinic.   BELOW ARE SYMPTOMS THAT SHOULD BE REPORTED IMMEDIATELY:  *FEVER GREATER THAN 100.5 F  *CHILLS WITH OR WITHOUT FEVER  NAUSEA AND VOMITING THAT IS NOT CONTROLLED WITH YOUR NAUSEA MEDICATION  *UNUSUAL SHORTNESS OF BREATH  *UNUSUAL BRUISING OR BLEEDING  TENDERNESS IN MOUTH AND THROAT WITH OR WITHOUT PRESENCE OF ULCERS  *URINARY PROBLEMS  *BOWEL PROBLEMS  UNUSUAL RASH Items with * indicate a potential emergency and should be followed up as soon as possible.  Feel free to call the clinic you have any questions or concerns. The clinic phone number is [phone].  

## 2016-02-20 NOTE — Telephone Encounter (Signed)
Cancelled blood on Friday 02-22-16, called home left message on answering machine.

## 2016-02-22 ENCOUNTER — Ambulatory Visit (HOSPITAL_BASED_OUTPATIENT_CLINIC_OR_DEPARTMENT_OTHER): Payer: Medicare Other

## 2016-02-22 ENCOUNTER — Encounter: Payer: Medicare Other | Admitting: Nutrition

## 2016-02-22 VITALS — BP 124/72 | HR 64 | Temp 97.9°F | Resp 18

## 2016-02-22 DIAGNOSIS — C169 Malignant neoplasm of stomach, unspecified: Secondary | ICD-10-CM

## 2016-02-22 MED ORDER — HEPARIN SOD (PORK) LOCK FLUSH 100 UNIT/ML IV SOLN
500.0000 [IU] | Freq: Once | INTRAVENOUS | Status: AC | PRN
Start: 2016-02-22 — End: 2016-02-22
  Administered 2016-02-22: 500 [IU]
  Filled 2016-02-22: qty 5

## 2016-02-22 MED ORDER — SODIUM CHLORIDE 0.9% FLUSH
10.0000 mL | INTRAVENOUS | Status: DC | PRN
  Administered 2016-02-22: 10 mL
  Filled 2016-02-22: qty 10

## 2016-02-22 NOTE — Progress Notes (Signed)
bolused remaining amount  of 4.1 mL

## 2016-02-22 NOTE — Patient Instructions (Signed)

## 2016-02-26 ENCOUNTER — Other Ambulatory Visit: Payer: Self-pay | Admitting: General Surgery

## 2016-03-10 ENCOUNTER — Other Ambulatory Visit: Payer: Self-pay | Admitting: General Surgery

## 2016-03-12 ENCOUNTER — Telehealth: Payer: Self-pay | Admitting: Oncology

## 2016-03-12 ENCOUNTER — Other Ambulatory Visit (HOSPITAL_BASED_OUTPATIENT_CLINIC_OR_DEPARTMENT_OTHER): Payer: Medicare Other

## 2016-03-12 ENCOUNTER — Ambulatory Visit (HOSPITAL_BASED_OUTPATIENT_CLINIC_OR_DEPARTMENT_OTHER): Payer: Medicare Other | Admitting: Oncology

## 2016-03-12 VITALS — BP 139/76 | HR 64 | Temp 98.2°F | Resp 18 | Ht 74.0 in | Wt 274.2 lb

## 2016-03-12 DIAGNOSIS — D509 Iron deficiency anemia, unspecified: Secondary | ICD-10-CM

## 2016-03-12 DIAGNOSIS — D649 Anemia, unspecified: Secondary | ICD-10-CM

## 2016-03-12 DIAGNOSIS — N289 Disorder of kidney and ureter, unspecified: Secondary | ICD-10-CM

## 2016-03-12 DIAGNOSIS — C163 Malignant neoplasm of pyloric antrum: Secondary | ICD-10-CM

## 2016-03-12 DIAGNOSIS — C169 Malignant neoplasm of stomach, unspecified: Secondary | ICD-10-CM

## 2016-03-12 DIAGNOSIS — Z8546 Personal history of malignant neoplasm of prostate: Secondary | ICD-10-CM

## 2016-03-12 LAB — CBC WITH DIFFERENTIAL/PLATELET
BASO%: 0.2 % (ref 0.0–2.0)
Basophils Absolute: 0 10*3/uL (ref 0.0–0.1)
EOS%: 5.2 % (ref 0.0–7.0)
Eosinophils Absolute: 0.2 10*3/uL (ref 0.0–0.5)
HCT: 28.3 % — ABNORMAL LOW (ref 38.4–49.9)
HEMOGLOBIN: 9.1 g/dL — AB (ref 13.0–17.1)
LYMPH%: 22.9 % (ref 14.0–49.0)
MCH: 31.1 pg (ref 27.2–33.4)
MCHC: 32.2 g/dL (ref 32.0–36.0)
MCV: 96.6 fL (ref 79.3–98.0)
MONO#: 1 10*3/uL — AB (ref 0.1–0.9)
MONO%: 25.1 % — ABNORMAL HIGH (ref 0.0–14.0)
NEUT%: 46.6 % (ref 39.0–75.0)
NEUTROS ABS: 1.9 10*3/uL (ref 1.5–6.5)
PLATELETS: 181 10*3/uL (ref 140–400)
RBC: 2.93 10*6/uL — AB (ref 4.20–5.82)
RDW: 22.2 % — AB (ref 11.0–14.6)
WBC: 4.1 10*3/uL (ref 4.0–10.3)
lymph#: 0.9 10*3/uL (ref 0.9–3.3)

## 2016-03-12 LAB — COMPREHENSIVE METABOLIC PANEL
ALT: 21 U/L (ref 0–55)
AST: 30 U/L (ref 5–34)
Albumin: 2.9 g/dL — ABNORMAL LOW (ref 3.5–5.0)
Alkaline Phosphatase: 48 U/L (ref 40–150)
Anion Gap: 8 mEq/L (ref 3–11)
BILIRUBIN TOTAL: 0.42 mg/dL (ref 0.20–1.20)
BUN: 20.7 mg/dL (ref 7.0–26.0)
CO2: 21 meq/L — AB (ref 22–29)
CREATININE: 1.6 mg/dL — AB (ref 0.7–1.3)
Calcium: 9.9 mg/dL (ref 8.4–10.4)
Chloride: 110 mEq/L — ABNORMAL HIGH (ref 98–109)
EGFR: 50 mL/min/{1.73_m2} — ABNORMAL LOW (ref >90)
GLUCOSE: 96 mg/dL (ref 70–140)
Potassium: 4.5 mEq/L (ref 3.5–5.1)
SODIUM: 138 meq/L (ref 136–145)
TOTAL PROTEIN: 6.9 g/dL (ref 6.4–8.3)

## 2016-03-12 NOTE — Telephone Encounter (Signed)
Gave patient avs report and appointments for April. Per patient he is no longer receiving injections.

## 2016-03-12 NOTE — Telephone Encounter (Signed)
Patient also given existing appointment for March.

## 2016-03-12 NOTE — Progress Notes (Signed)
Hematology and Oncology Follow Up Visit  Jacob [name] [id number] February 07, 1945 71 y.o. 03/12/2016 10:29 AM Jacob Cedar, MD   Principle Diagnosis:  71 year old gentleman with:  1.Normocytic, normochromic anemia diagnosed March 2017. His anemia is due to renal insufficiency with creatinine of 2.9 creatinine clearance 44 mL/m.  2. Prostate cancer in 2013 with a Gleason score 3+4 = 7 and a PSA 5.66 stage TIc.  3. 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 His staging by EUS showed T3 N2 disease without any evidence of metastatic disease.   Prior Therapy:  He is status post radiation therapy for definitive treatment for his prostate cancer. Therapy concluded in December 2013. He is status post packed red cell transfusions in April 2017. He is status post bone marrow biopsy in April 2017. Results did not show any plasma cell disorder or myelodysplasia. He is status post IV iron infusion completed in April 2017. This was repeated in September 2017. FOLFOX chemotherapy with cycle 1 to be given on 12/12/2015.  He is S/P cycle 5 of therapy given on 02/20/2016.  Current therapy: He is under evaluation for primary surgical therapy.   Interim History:  Jacob [name] presents today for a follow-up visit. Since the last visit, he completed neoadjuvant chemotherapy with total 5 cycles of FOLFOX completed 10/19/2016. He tolerated chemotherapy well without any delayed complications. He denied any nausea, fatigue or peripheral neuropathy. He is able to ambulate without any major difficulties and had not reported any falls or syncope. He was evaluated by Jacob [name] is scheduled to have surgery in the next 2 weeks.   He does not report any headaches, blurry vision, syncope or seizures. He does not report any fevers, chills, sweats or weight loss. Does not report any chest pain, palpitation, orthopnea or leg edema. He does not report any cough, wheezing or  hemoptysis. He does not report any frequency, urgency or hesitancy. He does not report any skeletal complaints of arthralgias or myalgias. Remaining review of systems unremarkable.   Medications: I have reviewed the patient's current medications.  Current Outpatient Prescriptions  Medication Sig Dispense Refill  . acetaminophen (TYLENOL) 500 MG tablet Take 1,000 mg by mouth daily as needed for moderate pain or headache.    . allopurinol (ZYLOPRIM) 300 MG tablet Take 300 mg by mouth daily.     Marland Kitchen buPROPion (WELLBUTRIN XL) 300 MG 24 hr tablet Take 300 mg by mouth daily.     . calcium carbonate (TUMS - DOSED IN MG ELEMENTAL CALCIUM) 500 MG chewable tablet Chew 3 tablets by mouth 2 (two) times daily as needed for indigestion or heartburn.    . diazepam (VALIUM) 5 MG tablet Take 5 mg by mouth at bedtime as needed (sleep).     . feeding supplement, GLUCERNA SHAKE, (GLUCERNA SHAKE) LIQD Take 237 mLs by mouth daily.     . furosemide (LASIX) 80 MG tablet Take 40 mg by mouth daily.     Marland Kitchen gabapentin (NEURONTIN) 800 MG tablet Take 400 mg by mouth at bedtime.     Marland Kitchen latanoprost (XALATAN) 0.005 % ophthalmic solution Place 1 drop into both eyes at bedtime.    . lidocaine-prilocaine (EMLA) cream Apply 1 application topically as needed. Apply to port before chemotherapy. 30 g 0  . Melatonin 10 MG TABS Take 1 tablet by mouth at bedtime as needed (sleep).     . Nebivolol HCl (BYSTOLIC) 20 MG TABS Take 20 mg by mouth daily.     Marland Kitchen  omeprazole (PRILOSEC) 20 MG capsule Take 20 mg by mouth daily.    Marland Kitchen oxyCODONE (OXY IR/ROXICODONE) 5 MG immediate release tablet Take 1-2 tablets (5-10 mg total) by mouth every 6 (six) hours as needed for moderate pain, severe pain or breakthrough pain. 15 tablet 0  . OXYGEN Inhale 2 L into the lungs at bedtime as needed (for shortness of breath).    . prochlorperazine (COMPAZINE) 10 MG tablet Take 1 tablet (10 mg total) by mouth every 6 (six) hours as needed for nausea or vomiting. 30 tablet  0  . tamsulosin (FLOMAX) 0.4 MG CAPS capsule TAKE 1 CAPSULE BY MOUTH DAILY 30 capsule 2  . traZODone (DESYREL) 50 MG tablet Take 50 mg by mouth at bedtime.     . Verapamil HCl CR 300 MG CP24 Take 300 mg by mouth at bedtime.      No current facility-administered medications for this visit.      Allergies:  No Known Allergies  Past Medical History, Surgical history, Social history, and Family History were reviewed and updated.   Physical Exam: Blood pressure 139/76, pulse 64, temperature 98.2 F (36.8 C), temperature source Oral, resp. rate 18, height 6' 2"  (1.88 m), weight 274 lb 3.2 oz (124.4 kg), SpO2 97 %. ECOG: 1 General appearance: Alert, awake gentleman without distress. Head: Normocephalic, without obvious abnormality no oral ulcers or lesions. Neck: no adenopathy Lymph nodes: Cervical, supraclavicular, and axillary nodes normal. Heart:regular rate and rhythm, S1, S2 normal, no murmur, click, rub or gallop Lung:chest clear, no wheezing, rales, normal symmetric air entry Abdomin: soft, non-tender, without masses or organomegaly no shifting dullness or ascites. EXT:no erythema, induration, or nodules   Lab Results: Lab Results  Component Value Date   WBC 4.1 03/12/2016   HGB 9.1 (L) 03/12/2016   HCT 28.3 (L) 03/12/2016   MCV 96.6 03/12/2016   PLT 181 03/12/2016     Chemistry      Component Value Date/Time   NA 139 02/20/2016 0853   K 4.7 02/20/2016 0853   CL 109 12/04/2015 0646   CO2 25 02/20/2016 0853   BUN 20.5 02/20/2016 0853   CREATININE 1.7 (H) 02/20/2016 0853      Component Value Date/Time   CALCIUM 9.6 02/20/2016 0853   ALKPHOS 46 02/20/2016 0853   AST 30 02/20/2016 0853   ALT 20 02/20/2016 0853   BILITOT 0.60 02/20/2016 0853       Impression and Plan:  71 year old woman with the following issues:  1. Normocytic, normochromic anemia presented initially with a hemoglobin of 9.2 now has drifted down to 6.0. His MCV is normal at 97 but does have  an elevated RDW. His anemia is related to renal insufficiency as well as iron deficiency.  His hemoglobin is stable at this time and does not require packed red cell transfusion.  2. Antral mass that is biopsy proven to be adenocarcinoma. He is status post EUS and PET CT scan which confirmed a staging of about T3 N2 disease. No evidence of distant metastasis noted.  He is S/P 5 cycles of FOLFOX and tolerated well.  PET CT scan obtained on 02/07/2016 which showed excellent response to therapy but clearly with residual tumor.   He is planning to have primary surgical therapy scheduled by Jacob [name] on 03/25/2016. Following his surgery, we'll arrange follow-up at that time to assess whether he will need adjuvant chemotherapy after that.   3. 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 His last protein studies obtained in September 2017 showed no major changes and will be repeated annually.  4. Renal insufficiency: His creatinine remains stable at this time.   5. IV access: Port-A-Cath remains in place at the time being.  7. Thrombocytopenia: This is chemotherapy related she has resolved at this time.  8. Follow-up: One month after surgery to assess his recovery.   [facility], MD 2/28/201810:29 AM

## 2016-03-13 ENCOUNTER — Ambulatory Visit (HOSPITAL_COMMUNITY)
Admission: RE | Admit: 2016-03-13 | Discharge: 2016-03-13 | Disposition: A | Discharge status: Home / Self Care | Payer: Medicare Other | Source: Ambulatory Visit | Attending: Oncology | Admitting: Oncology

## 2016-03-19 ENCOUNTER — Encounter (HOSPITAL_COMMUNITY)
Admission: RE | Admit: 2016-03-19 | Discharge: 2016-03-19 | Disposition: A | Discharge status: Home / Self Care | Payer: Medicare Other | Source: Ambulatory Visit | Attending: General Surgery | Admitting: General Surgery

## 2016-03-19 ENCOUNTER — Encounter (HOSPITAL_COMMUNITY): Payer: Self-pay

## 2016-03-19 DIAGNOSIS — Z01812 Encounter for preprocedural laboratory examination: Secondary | ICD-10-CM | POA: Insufficient documentation

## 2016-03-19 DIAGNOSIS — C169 Malignant neoplasm of stomach, unspecified: Secondary | ICD-10-CM | POA: Insufficient documentation

## 2016-03-19 HISTORY — DX: Gastro-esophageal reflux disease without esophagitis: K21.9

## 2016-03-19 HISTORY — DX: Sleep apnea, unspecified: G47.30

## 2016-03-19 LAB — BASIC METABOLIC PANEL WITH GFR
Anion gap: 8 (ref 5–15)
BUN: 19 mg/dL (ref 6–20)
CO2: 22 mmol/L (ref 22–32)
Calcium: 9.5 mg/dL (ref 8.9–10.3)
Chloride: 106 mmol/L (ref 101–111)
Creatinine, Ser: 1.71 mg/dL — ABNORMAL HIGH (ref 0.61–1.24)
GFR calc Af Amer: 45 mL/min — ABNORMAL LOW
GFR calc non Af Amer: 39 mL/min — ABNORMAL LOW
Glucose, Bld: 92 mg/dL (ref 65–99)
Potassium: 4.6 mmol/L (ref 3.5–5.1)
Sodium: 136 mmol/L (ref 135–145)

## 2016-03-19 LAB — CBC
HCT: 30.2 % — ABNORMAL LOW (ref 39.0–52.0)
Hemoglobin: 9.6 g/dL — ABNORMAL LOW (ref 13.0–17.0)
MCH: 31.1 pg (ref 26.0–34.0)
MCHC: 31.8 g/dL (ref 30.0–36.0)
MCV: 97.7 fL (ref 78.0–100.0)
PLATELETS: 175 10*3/uL (ref 150–400)
RBC: 3.09 MIL/uL — ABNORMAL LOW (ref 4.22–5.81)
RDW: 20.9 % — AB (ref 11.5–15.5)
WBC: 6.9 10*3/uL (ref 4.0–10.5)

## 2016-03-19 LAB — URINALYSIS, ROUTINE W REFLEX MICROSCOPIC
Bacteria, UA: NONE SEEN
Bilirubin Urine: NEGATIVE
Glucose, UA: NEGATIVE mg/dL
Hgb urine dipstick: NEGATIVE
Ketones, ur: NEGATIVE mg/dL
Leukocytes, UA: NEGATIVE
Nitrite: NEGATIVE
Protein, ur: 100 mg/dL — AB
Specific Gravity, Urine: 1.017 (ref 1.005–1.030)
Squamous Epithelial / HPF: NONE SEEN
pH: 5 (ref 5.0–8.0)

## 2016-03-19 NOTE — Pre-Procedure Instructions (Signed)
Jacob [name]  03/19/2016      [place], [facility] [address] [place] [address] Phone: [phone] Fax: [phone]    Your procedure is scheduled on 03-25-2016  Tuesday   Report to [facility] Admitting at 7:00 A.M.   Call this number if you have problems the morning of surgery:  [phone]   Remember:  Do not eat food or drink liquids after midnight.   Take these medicines the morning of surgery with A SIP OF WATER Tylenol if needed,allopurinol(Zyloprim),Bupropion(Wellbutrin XL),Nebivolol(Bystolic),Prochlorperazine(Compazine) if needed,Tamsulosin(Flomax),   TWO HOURS PRIOR TO ARRIVAL AT SHORT STAY FOR SURGERY DRINK ONE BOX OF BOOST  5:00 am           STOP ASPIRIN,ANTIINFLAMATORIES (IBUPROFEN,ALEVE,MOTRIN,ADVIL,GOODY'S POWDERS),HERBAL SUPPLEMENTS,FISH OIL,AND VITAMINS 5-7 DAYS PRIOR TO SURGERY                  Do not wear jewelry, .  Do not wear lotions, powders, or perfumes, or deoderant.  Do not shave 48 hours prior to surgery.  Men may shave face and neck.   Do not bring valuables to the hospital.  [facility] is not responsible for any belongings or valuables.  Contacts, dentures or bridgework may not be worn into surgery.  Leave your suitcase in the car.  After surgery it may be brought to your room.  For patients admitted to the hospital, discharge time will be determined by your treatment team.  Patients discharged the day of surgery will not be allowed to drive home.     Special Instructions: [facility] - Preparing for Surgery  Before surgery, you can play an important role.  Because skin is not sterile, your skin needs to be as free of germs as possible.  You can reduce the number of germs on you skin by washing with CHG (chlorahexidine gluconate) soap before surgery.  CHG is an antiseptic cleaner which kills germs and bonds with the skin to continue killing germs even after  washing.  Please DO NOT use if you have an allergy to CHG or antibacterial soaps.  If your skin becomes reddened/irritated stop using the CHG and inform your nurse when you arrive at Short Stay.  Do not shave (including legs and underarms) for at least 48 hours prior to the first CHG shower.  You may shave your face.  Please follow these instructions carefully:   1.  Shower with CHG Soap the night before surgery and the   morning of Surgery.  2.  If you choose to wash your hair, wash your hair first as usual with your normal shampoo.  3.  After you shampoo, rinse your hair and body thoroughly to remove the  Shampoo.  4.  Use CHG as you would any other liquid soap.  You can apply chg directly  to the skin and wash gently with scrungie or a clean washcloth.  5.  Apply the CHG Soap to your body ONLY FROM THE NECK DOWN.   Do not use on open wounds or open sores.  Avoid contact with your eyes,  ears, mouth and genitals (private parts).  Wash genitals (private parts) with your normal soap.  6.  Wash thoroughly, paying special attention to the area where your surgery will be performed.  7.  Thoroughly rinse your body with warm water from the neck down.  8.  DO NOT shower/wash with your normal soap after using and rinsing o  the  CHG Soap.  9.  Pat yourself dry with a clean towel.            10.  Wear clean pajamas.            11.  Place clean sheets on your bed the night of your first shower and do not sleep with pets.  Day of Surgery  Do not apply any lotions/deodorants the morning of surgery.  Please wear clean clothes to the hospital/surgery center.

## 2016-03-24 MED ORDER — DEXTROSE 5 % IV SOLN
3.0000 g | INTRAVENOUS | Status: AC
  Administered 2016-03-25: 3 g via INTRAVENOUS
  Filled 2016-03-24: qty 3000

## 2016-03-25 ENCOUNTER — Inpatient Hospital Stay (HOSPITAL_COMMUNITY): Payer: Medicare Other | Admitting: Certified Registered Nurse Anesthetist

## 2016-03-25 ENCOUNTER — Encounter (HOSPITAL_COMMUNITY)
Admission: RE | Disposition: A | Discharge status: Home Health | Payer: Self-pay | Source: Ambulatory Visit | Attending: General Surgery

## 2016-03-25 ENCOUNTER — Encounter (HOSPITAL_COMMUNITY): Payer: Self-pay | Admitting: *Deleted

## 2016-03-25 ENCOUNTER — Inpatient Hospital Stay (HOSPITAL_COMMUNITY)
Admission: RE | Admit: 2016-03-25 | Discharge: 2016-04-07 | DRG: 327 | Disposition: A | Discharge status: Home Health | Payer: Medicare Other | Source: Ambulatory Visit | Attending: General Surgery | Admitting: General Surgery

## 2016-03-25 DIAGNOSIS — D62 Acute posthemorrhagic anemia: Secondary | ICD-10-CM | POA: Diagnosis not present

## 2016-03-25 DIAGNOSIS — N401 Enlarged prostate with lower urinary tract symptoms: Secondary | ICD-10-CM | POA: Diagnosis present

## 2016-03-25 DIAGNOSIS — N179 Acute kidney failure, unspecified: Secondary | ICD-10-CM | POA: Diagnosis present

## 2016-03-25 DIAGNOSIS — Z9221 Personal history of antineoplastic chemotherapy: Secondary | ICD-10-CM | POA: Diagnosis not present

## 2016-03-25 DIAGNOSIS — T451X5A Adverse effect of antineoplastic and immunosuppressive drugs, initial encounter: Secondary | ICD-10-CM | POA: Diagnosis present

## 2016-03-25 DIAGNOSIS — Z87891 Personal history of nicotine dependence: Secondary | ICD-10-CM

## 2016-03-25 DIAGNOSIS — R627 Adult failure to thrive: Secondary | ICD-10-CM | POA: Diagnosis present

## 2016-03-25 DIAGNOSIS — K219 Gastro-esophageal reflux disease without esophagitis: Secondary | ICD-10-CM | POA: Diagnosis present

## 2016-03-25 DIAGNOSIS — T85598A Other mechanical complication of other gastrointestinal prosthetic devices, implants and grafts, initial encounter: Secondary | ICD-10-CM

## 2016-03-25 DIAGNOSIS — Z923 Personal history of irradiation: Secondary | ICD-10-CM | POA: Diagnosis not present

## 2016-03-25 DIAGNOSIS — K9413 Enterostomy malfunction: Secondary | ICD-10-CM

## 2016-03-25 DIAGNOSIS — G4733 Obstructive sleep apnea (adult) (pediatric): Secondary | ICD-10-CM | POA: Diagnosis present

## 2016-03-25 DIAGNOSIS — M1990 Unspecified osteoarthritis, unspecified site: Secondary | ICD-10-CM | POA: Diagnosis present

## 2016-03-25 DIAGNOSIS — Y833 Surgical operation with formation of external stoma as the cause of abnormal reaction of the patient, or of later complication, without mention of misadventure at the time of the procedure: Secondary | ICD-10-CM | POA: Diagnosis not present

## 2016-03-25 DIAGNOSIS — M109 Gout, unspecified: Secondary | ICD-10-CM | POA: Diagnosis present

## 2016-03-25 DIAGNOSIS — C163 Malignant neoplasm of pyloric antrum: Secondary | ICD-10-CM | POA: Diagnosis present

## 2016-03-25 DIAGNOSIS — K30 Functional dyspepsia: Secondary | ICD-10-CM | POA: Diagnosis present

## 2016-03-25 DIAGNOSIS — Z800 Family history of malignant neoplasm of digestive organs: Secondary | ICD-10-CM

## 2016-03-25 DIAGNOSIS — E7800 Pure hypercholesterolemia, unspecified: Secondary | ICD-10-CM | POA: Diagnosis present

## 2016-03-25 DIAGNOSIS — R112 Nausea with vomiting, unspecified: Secondary | ICD-10-CM | POA: Diagnosis present

## 2016-03-25 DIAGNOSIS — N138 Other obstructive and reflux uropathy: Secondary | ICD-10-CM | POA: Diagnosis present

## 2016-03-25 DIAGNOSIS — I129 Hypertensive chronic kidney disease with stage 1 through stage 4 chronic kidney disease, or unspecified chronic kidney disease: Secondary | ICD-10-CM | POA: Diagnosis present

## 2016-03-25 DIAGNOSIS — D631 Anemia in chronic kidney disease: Secondary | ICD-10-CM | POA: Diagnosis present

## 2016-03-25 DIAGNOSIS — D630 Anemia in neoplastic disease: Secondary | ICD-10-CM | POA: Diagnosis present

## 2016-03-25 DIAGNOSIS — F329 Major depressive disorder, single episode, unspecified: Secondary | ICD-10-CM | POA: Diagnosis present

## 2016-03-25 DIAGNOSIS — Z8546 Personal history of malignant neoplasm of prostate: Secondary | ICD-10-CM | POA: Diagnosis not present

## 2016-03-25 DIAGNOSIS — E860 Dehydration: Secondary | ICD-10-CM | POA: Diagnosis present

## 2016-03-25 DIAGNOSIS — D500 Iron deficiency anemia secondary to blood loss (chronic): Secondary | ICD-10-CM | POA: Diagnosis present

## 2016-03-25 DIAGNOSIS — D696 Thrombocytopenia, unspecified: Secondary | ICD-10-CM | POA: Diagnosis present

## 2016-03-25 DIAGNOSIS — D6481 Anemia due to antineoplastic chemotherapy: Secondary | ICD-10-CM | POA: Diagnosis present

## 2016-03-25 DIAGNOSIS — Z79899 Other long term (current) drug therapy: Secondary | ICD-10-CM | POA: Diagnosis not present

## 2016-03-25 DIAGNOSIS — N183 Chronic kidney disease, stage 3 (moderate): Secondary | ICD-10-CM | POA: Diagnosis present

## 2016-03-25 DIAGNOSIS — F419 Anxiety disorder, unspecified: Secondary | ICD-10-CM | POA: Diagnosis present

## 2016-03-25 HISTORY — PX: LAPAROSCOPIC GASTRECTOMY: SHX5894

## 2016-03-25 HISTORY — PX: GASTRECTOMY: SHX58

## 2016-03-25 HISTORY — PX: GASTROSTOMY: SHX5249

## 2016-03-25 HISTORY — PX: LAPAROSCOPY: SHX197

## 2016-03-25 LAB — PREPARE RBC (CROSSMATCH)

## 2016-03-25 LAB — CBC
HEMATOCRIT: 28 % — AB (ref 39.0–52.0)
Hemoglobin: 9 g/dL — ABNORMAL LOW (ref 13.0–17.0)
MCH: 32.4 pg (ref 26.0–34.0)
MCHC: 32.1 g/dL (ref 30.0–36.0)
MCV: 100.7 fL — AB (ref 78.0–100.0)
PLATELETS: 164 10*3/uL (ref 150–400)
RBC: 2.78 MIL/uL — AB (ref 4.22–5.81)
RDW: 20.8 % — ABNORMAL HIGH (ref 11.5–15.5)
WBC: 16.7 10*3/uL — AB (ref 4.0–10.5)

## 2016-03-25 LAB — CREATININE, SERUM
Creatinine, Ser: 1.77 mg/dL — ABNORMAL HIGH (ref 0.61–1.24)
GFR calc non Af Amer: 37 mL/min — ABNORMAL LOW (ref >60)
GFR, EST AFRICAN AMERICAN: 43 mL/min — AB (ref >60)

## 2016-03-25 SURGERY — LAPAROSCOPY, DIAGNOSTIC
Anesthesia: General | Site: Abdomen

## 2016-03-25 MED ORDER — BUPIVACAINE ON-Q PAIN PUMP (FOR ORDER SET NO CHG)
INJECTION | Status: AC
  Filled 2016-03-25: qty 1

## 2016-03-25 MED ORDER — LIDOCAINE-EPINEPHRINE (PF) 1 %-1:200000 IJ SOLN
INTRAMUSCULAR | Status: DC | PRN
  Administered 2016-03-25: 4 mL via INTRAMUSCULAR

## 2016-03-25 MED ORDER — LIDOCAINE-EPINEPHRINE (PF) 1 %-1:200000 IJ SOLN
INTRAMUSCULAR | Status: AC
  Filled 2016-03-25: qty 30

## 2016-03-25 MED ORDER — DEXAMETHASONE SODIUM PHOSPHATE 10 MG/ML IJ SOLN
INTRAMUSCULAR | Status: AC
  Filled 2016-03-25: qty 1

## 2016-03-25 MED ORDER — HYDROMORPHONE 1 MG/ML IV SOLN
INTRAVENOUS | Status: DC
  Administered 2016-03-25: 0.6 mg via INTRAVENOUS
  Administered 2016-03-25: 13:00:00 via INTRAVENOUS
  Administered 2016-03-26: 0.8 mg via INTRAVENOUS
  Administered 2016-03-26: 1.8 mg via INTRAVENOUS
  Administered 2016-03-26: 1.2 mg via INTRAVENOUS
  Administered 2016-03-26: 1 mg via INTRAVENOUS
  Administered 2016-03-26 (×2): 0.8 mg via INTRAVENOUS
  Administered 2016-03-27: 0.6 mg via INTRAVENOUS
  Administered 2016-03-27: 0.4 mg via INTRAVENOUS
  Administered 2016-03-27: 1.2 mg via INTRAVENOUS
  Administered 2016-03-27: 1.4 mg via INTRAVENOUS
  Administered 2016-03-27: 1.8 mg via INTRAVENOUS
  Administered 2016-03-27 – 2016-03-28 (×2): 5 mg via INTRAVENOUS
  Administered 2016-03-28: 16:00:00 via INTRAVENOUS
  Administered 2016-03-28: 6.4 mL via INTRAVENOUS
  Administered 2016-03-28: 1.6 mg via INTRAVENOUS
  Administered 2016-03-28: 2.65 mg via INTRAVENOUS
  Administered 2016-03-28: 8 mg via INTRAVENOUS
  Administered 2016-03-29: 0.599 mg via INTRAVENOUS
  Administered 2016-03-29 (×2): 0.8 mg via INTRAVENOUS
  Filled 2016-03-25: qty 25

## 2016-03-25 MED ORDER — HYDRALAZINE HCL 20 MG/ML IJ SOLN: 20.0000 mg | INTRAMUSCULAR | Status: DC | PRN

## 2016-03-25 MED ORDER — CHLORHEXIDINE GLUCONATE CLOTH 2 % EX PADS: 6.0000 | MEDICATED_PAD | Freq: Once | CUTANEOUS | Status: DC

## 2016-03-25 MED ORDER — FENTANYL CITRATE (PF) 100 MCG/2ML IJ SOLN: 25.0000 ug | INTRAMUSCULAR | Status: DC | PRN

## 2016-03-25 MED ORDER — STERILE WATER FOR IRRIGATION IR SOLN
Status: DC | PRN
  Administered 2016-03-25: 1000 mL

## 2016-03-25 MED ORDER — FUROSEMIDE 10 MG/ML IJ SOLN
40.0000 mg | Freq: Every day | INTRAMUSCULAR | Status: AC
  Administered 2016-03-26 – 2016-03-28 (×3): 40 mg via INTRAVENOUS
  Filled 2016-03-25 (×3): qty 4

## 2016-03-25 MED ORDER — PANTOPRAZOLE SODIUM 40 MG IV SOLR
40.0000 mg | Freq: Every day | INTRAVENOUS | Status: DC
  Administered 2016-03-25 – 2016-03-29 (×5): 40 mg via INTRAVENOUS
  Filled 2016-03-25 (×5): qty 40

## 2016-03-25 MED ORDER — HYDROMORPHONE HCL 1 MG/ML IJ SOLN
INTRAMUSCULAR | Status: DC | PRN
  Administered 2016-03-25 (×2): .5 mg via INTRAVENOUS

## 2016-03-25 MED ORDER — METHOCARBAMOL 1000 MG/10ML IJ SOLN
500.0000 mg | Freq: Four times a day (QID) | INTRAVENOUS | Status: DC | PRN
  Administered 2016-03-30 – 2016-03-31 (×2): 500 mg via INTRAVENOUS
  Filled 2016-03-25 (×4): qty 5

## 2016-03-25 MED ORDER — OXYCODONE HCL 5 MG/5ML PO SOLN: 5.0000 mg | Freq: Once | ORAL | Status: DC | PRN

## 2016-03-25 MED ORDER — ROCURONIUM BROMIDE 100 MG/10ML IV SOLN
INTRAVENOUS | Status: DC | PRN
  Administered 2016-03-25: 30 mg via INTRAVENOUS
  Administered 2016-03-25: 50 mg via INTRAVENOUS
  Administered 2016-03-25: 20 mg via INTRAVENOUS

## 2016-03-25 MED ORDER — OXYCODONE HCL 5 MG PO TABS: 5.0000 mg | ORAL_TABLET | Freq: Once | ORAL | Status: DC | PRN

## 2016-03-25 MED ORDER — BUPIVACAINE 0.25 % ON-Q PUMP DUAL CATH 300 ML
INJECTION | Status: DC | PRN
  Administered 2016-03-25: 300 mL

## 2016-03-25 MED ORDER — ONDANSETRON HCL 4 MG/2ML IJ SOLN
4.0000 mg | Freq: Four times a day (QID) | INTRAMUSCULAR | Status: DC | PRN
  Administered 2016-04-02 – 2016-04-03 (×2): 4 mg via INTRAVENOUS
  Filled 2016-03-25 (×2): qty 2

## 2016-03-25 MED ORDER — ONDANSETRON HCL 4 MG/2ML IJ SOLN: 4.0000 mg | Freq: Four times a day (QID) | INTRAMUSCULAR | Status: DC | PRN

## 2016-03-25 MED ORDER — DIPHENHYDRAMINE HCL 12.5 MG/5ML PO ELIX
12.5000 mg | ORAL_SOLUTION | Freq: Four times a day (QID) | ORAL | Status: DC | PRN
  Administered 2016-03-31 – 2016-04-03 (×2): 12.5 mg via ORAL
  Filled 2016-03-25 (×2): qty 10

## 2016-03-25 MED ORDER — SUCCINYLCHOLINE CHLORIDE 20 MG/ML IJ SOLN
INTRAMUSCULAR | Status: DC | PRN
  Administered 2016-03-25: 140 mg via INTRAVENOUS

## 2016-03-25 MED ORDER — HYDROMORPHONE HCL 2 MG/ML IJ SOLN
0.5000 mg | INTRAMUSCULAR | Status: DC | PRN
  Administered 2016-03-29: 1 mg via INTRAVENOUS
  Administered 2016-03-30: 0.5 mg via INTRAVENOUS
  Administered 2016-03-31 – 2016-04-02 (×4): 1 mg via INTRAVENOUS
  Filled 2016-03-25 (×6): qty 1

## 2016-03-25 MED ORDER — ONDANSETRON 4 MG PO TBDP
4.0000 mg | ORAL_TABLET | Freq: Four times a day (QID) | ORAL | Status: DC | PRN
Start: 2016-03-25 — End: 2016-04-07

## 2016-03-25 MED ORDER — BUPIVACAINE 0.25 % ON-Q PUMP DUAL CATH 300 ML
300.0000 mL | INJECTION | Status: DC
  Filled 2016-03-25: qty 300

## 2016-03-25 MED ORDER — SODIUM CHLORIDE 0.9 % IR SOLN
Status: DC | PRN
  Administered 2016-03-25: 1000 mL

## 2016-03-25 MED ORDER — FENTANYL CITRATE (PF) 100 MCG/2ML IJ SOLN
INTRAMUSCULAR | Status: AC
  Filled 2016-03-25: qty 4

## 2016-03-25 MED ORDER — CEFAZOLIN SODIUM-DEXTROSE 2-4 GM/100ML-% IV SOLN
2.0000 g | Freq: Three times a day (TID) | INTRAVENOUS | Status: AC
  Administered 2016-03-25: 2 g via INTRAVENOUS
  Filled 2016-03-25: qty 100

## 2016-03-25 MED ORDER — DILTIAZEM HCL 100 MG IV SOLR
5.0000 mg/h | INTRAVENOUS | Status: DC
  Filled 2016-03-25: qty 100

## 2016-03-25 MED ORDER — METOPROLOL TARTRATE 5 MG/5ML IV SOLN
5.0000 mg | Freq: Four times a day (QID) | INTRAVENOUS | Status: DC
  Administered 2016-03-25: 5 mg via INTRAVENOUS
  Filled 2016-03-25: qty 5

## 2016-03-25 MED ORDER — DIPHENHYDRAMINE HCL 50 MG/ML IJ SOLN: 12.5000 mg | Freq: Four times a day (QID) | INTRAMUSCULAR | Status: DC | PRN

## 2016-03-25 MED ORDER — DIPHENHYDRAMINE HCL 50 MG/ML IJ SOLN
12.5000 mg | Freq: Four times a day (QID) | INTRAMUSCULAR | Status: DC | PRN
  Administered 2016-03-30: 12.5 mg via INTRAVENOUS
  Filled 2016-03-25: qty 1

## 2016-03-25 MED ORDER — ONDANSETRON HCL 4 MG/2ML IJ SOLN
INTRAMUSCULAR | Status: AC
  Filled 2016-03-25: qty 4

## 2016-03-25 MED ORDER — LORAZEPAM BOLUS VIA INFUSION
0.5000 mg | Freq: Every evening | INTRAVENOUS | Status: DC | PRN
  Filled 2016-03-25: qty 1

## 2016-03-25 MED ORDER — METOPROLOL TARTRATE 5 MG/5ML IV SOLN
7.5000 mg | Freq: Four times a day (QID) | INTRAVENOUS | Status: DC
  Administered 2016-03-26 – 2016-03-28 (×10): 7.5 mg via INTRAVENOUS
  Filled 2016-03-25 (×11): qty 10

## 2016-03-25 MED ORDER — FENTANYL CITRATE (PF) 100 MCG/2ML IJ SOLN
INTRAMUSCULAR | Status: DC | PRN
  Administered 2016-03-25: 100 ug via INTRAVENOUS
  Administered 2016-03-25 (×2): 50 ug via INTRAVENOUS
  Administered 2016-03-25: 100 ug via INTRAVENOUS
  Administered 2016-03-25 (×2): 50 ug via INTRAVENOUS

## 2016-03-25 MED ORDER — SODIUM CHLORIDE 0.9% FLUSH: 9.0000 mL | INTRAVENOUS | Status: DC | PRN

## 2016-03-25 MED ORDER — ACETAMINOPHEN 10 MG/ML IV SOLN
1000.0000 mg | Freq: Four times a day (QID) | INTRAVENOUS | Status: AC
  Administered 2016-03-25 – 2016-03-26 (×4): 1000 mg via INTRAVENOUS
  Filled 2016-03-25 (×4): qty 100

## 2016-03-25 MED ORDER — SUGAMMADEX SODIUM 200 MG/2ML IV SOLN
INTRAVENOUS | Status: DC | PRN
  Administered 2016-03-25: 300 mg via INTRAVENOUS

## 2016-03-25 MED ORDER — BUPIVACAINE HCL (PF) 0.25 % IJ SOLN
INTRAMUSCULAR | Status: AC
  Filled 2016-03-25: qty 30

## 2016-03-25 MED ORDER — SODIUM CHLORIDE 0.9 % IR SOLN
Status: DC | PRN
  Administered 2016-03-25 (×2): 1000 mL

## 2016-03-25 MED ORDER — SODIUM CHLORIDE 0.9 % IV SOLN
INTRAVENOUS | Status: DC
  Administered 2016-03-25: 08:00:00 via INTRAVENOUS

## 2016-03-25 MED ORDER — LIDOCAINE 2% (20 MG/ML) 5 ML SYRINGE
INTRAMUSCULAR | Status: AC
  Filled 2016-03-25: qty 10

## 2016-03-25 MED ORDER — ONDANSETRON HCL 4 MG/2ML IJ SOLN: 4.0000 mg | Freq: Once | INTRAMUSCULAR | Status: DC | PRN

## 2016-03-25 MED ORDER — LATANOPROST 0.005 % OP SOLN: 1.0000 [drp] | Freq: Every day | OPHTHALMIC | Status: DC

## 2016-03-25 MED ORDER — HYDROMORPHONE 1 MG/ML IV SOLN
INTRAVENOUS | Status: AC
  Filled 2016-03-25: qty 25

## 2016-03-25 MED ORDER — NALOXONE HCL 0.4 MG/ML IJ SOLN: 0.4000 mg | INTRAMUSCULAR | Status: DC | PRN

## 2016-03-25 MED ORDER — LACTATED RINGERS IV SOLN
INTRAVENOUS | Status: DC | PRN
  Administered 2016-03-25: 11:00:00 via INTRAVENOUS

## 2016-03-25 MED ORDER — DIPHENHYDRAMINE HCL 12.5 MG/5ML PO ELIX: 12.5000 mg | ORAL_SOLUTION | Freq: Four times a day (QID) | ORAL | Status: DC | PRN

## 2016-03-25 MED ORDER — LIDOCAINE HCL (CARDIAC) 20 MG/ML IV SOLN
INTRAVENOUS | Status: DC | PRN
  Administered 2016-03-25: 100 mg via INTRAVENOUS

## 2016-03-25 MED ORDER — HYDROMORPHONE HCL 1 MG/ML IJ SOLN
INTRAMUSCULAR | Status: AC
  Filled 2016-03-25: qty 1

## 2016-03-25 MED ORDER — HEPARIN SODIUM (PORCINE) 5000 UNIT/ML IJ SOLN
5000.0000 [IU] | Freq: Three times a day (TID) | INTRAMUSCULAR | Status: DC
  Administered 2016-03-25 – 2016-03-31 (×19): 5000 [IU] via SUBCUTANEOUS
  Filled 2016-03-25 (×19): qty 1

## 2016-03-25 MED ORDER — PROPOFOL 10 MG/ML IV BOLUS
INTRAVENOUS | Status: DC | PRN
  Administered 2016-03-25: 180 mg via INTRAVENOUS

## 2016-03-25 MED ORDER — DILTIAZEM HCL 25 MG/5ML IV SOLN: 20.0000 mg | Freq: Four times a day (QID) | INTRAVENOUS | Status: DC

## 2016-03-25 MED ORDER — POTASSIUM CHLORIDE IN NACL 20-0.9 MEQ/L-% IV SOLN
INTRAVENOUS | Status: DC
  Administered 2016-03-25 – 2016-03-26 (×2): via INTRAVENOUS
  Filled 2016-03-25 (×2): qty 1000

## 2016-03-25 SURGICAL SUPPLY — 89 items
ADH SKN CLS APL DERMABOND .7 (GAUZE/BANDAGES/DRESSINGS)
BAG URINE DRAINAGE (UROLOGICAL SUPPLIES) ×1 IMPLANT
BLADE CLIPPER SURG (BLADE) ×1 IMPLANT
CANISTER SUCT 3000ML PPV (MISCELLANEOUS) ×3 IMPLANT
CATH KIT ON-Q SILVERSOAK 7.5 (CATHETERS) IMPLANT
CATH KIT ON-Q SILVERSOAK 7.5IN (CATHETERS) ×4 IMPLANT
CHLORAPREP W/TINT 26ML (MISCELLANEOUS) ×2 IMPLANT
CLIP LIGATING HEM O LOK PURPLE (MISCELLANEOUS) ×1 IMPLANT
CLIP LIGATING HEMO O LOK GREEN (MISCELLANEOUS) ×1 IMPLANT
CLIP LIGATING HEMOLOK MED (MISCELLANEOUS) ×1 IMPLANT
CLIP TI LARGE 6 (CLIP) ×1 IMPLANT
CLIP TI MEDIUM 24 (CLIP) IMPLANT
CLIP TI MEDIUM 6 (CLIP) ×1 IMPLANT
COVER SURGICAL LIGHT HANDLE (MISCELLANEOUS) ×2 IMPLANT
DECANTER SPIKE VIAL GLASS SM (MISCELLANEOUS) ×2 IMPLANT
DERMABOND ADVANCED (GAUZE/BANDAGES/DRESSINGS)
DERMABOND ADVANCED .7 DNX12 (GAUZE/BANDAGES/DRESSINGS) ×1 IMPLANT
DRAIN PENROSE 1/2X36 STERILE (WOUND CARE) IMPLANT
DRAPE LAPAROSCOPIC ABDOMINAL (DRAPES) ×2 IMPLANT
DRAPE UTILITY XL STRL (DRAPES) ×2 IMPLANT
DRAPE WARM FLUID 44X44 (DRAPE) ×2 IMPLANT
ELECT BLADE 6.5 EXT (BLADE) ×1 IMPLANT
ELECT REM PT RETURN 9FT ADLT (ELECTROSURGICAL) ×2
ELECTRODE REM PT RTRN 9FT ADLT (ELECTROSURGICAL) ×1 IMPLANT
GAUZE SPONGE 4X4 12PLY STRL (GAUZE/BANDAGES/DRESSINGS) ×1 IMPLANT
GLOVE BIO SURGEON STRL SZ 6 (GLOVE) ×2 IMPLANT
GLOVE BIO SURGEON STRL SZ7 (GLOVE) ×2 IMPLANT
GLOVE BIO SURGEON STRL SZ8 (GLOVE) ×1 IMPLANT
GLOVE BIOGEL PI IND STRL 6.5 (GLOVE) ×1 IMPLANT
GLOVE BIOGEL PI IND STRL 7.5 (GLOVE) IMPLANT
GLOVE BIOGEL PI IND STRL 8.5 (GLOVE) IMPLANT
GLOVE BIOGEL PI INDICATOR 6.5 (GLOVE) ×2
GLOVE BIOGEL PI INDICATOR 7.5 (GLOVE) ×1
GLOVE BIOGEL PI INDICATOR 8.5 (GLOVE) ×1
GLOVE ECLIPSE 6.5 STRL STRAW (GLOVE) ×1 IMPLANT
GLOVE ECLIPSE 7.5 STRL STRAW (GLOVE) ×1 IMPLANT
GOWN STRL REUS W/ TWL LRG LVL3 (GOWN DISPOSABLE) ×2 IMPLANT
GOWN STRL REUS W/ TWL XL LVL3 (GOWN DISPOSABLE) IMPLANT
GOWN STRL REUS W/TWL 2XL LVL3 (GOWN DISPOSABLE) ×3 IMPLANT
GOWN STRL REUS W/TWL LRG LVL3 (GOWN DISPOSABLE) ×4
GOWN STRL REUS W/TWL XL LVL3 (GOWN DISPOSABLE) ×2
KIT BASIN OR (CUSTOM PROCEDURE TRAY) ×2 IMPLANT
KIT ROOM TURNOVER OR (KITS) ×2 IMPLANT
L-HOOK LAP DISP 36CM (ELECTROSURGICAL) ×2
LHOOK LAP DISP 36CM (ELECTROSURGICAL) ×1 IMPLANT
NDL HYPO 25GX1X1/2 BEV (NEEDLE) IMPLANT
NEEDLE HYPO 25GX1X1/2 BEV (NEEDLE) ×2 IMPLANT
NS IRRIG 1000ML POUR BTL (IV SOLUTION) ×3 IMPLANT
PACK GENERAL/GYN (CUSTOM PROCEDURE TRAY) ×2 IMPLANT
PAD ARMBOARD 7.5X6 YLW CONV (MISCELLANEOUS) ×4 IMPLANT
PENCIL BUTTON HOLSTER BLD 10FT (ELECTRODE) ×1 IMPLANT
RELOAD PROXIMATE 75MM BLUE (ENDOMECHANICALS) ×2 IMPLANT
RELOAD STAPLE 75 3.8 BLU REG (ENDOMECHANICALS) IMPLANT
RELOAD STAPLER LINEAR PROX 30 (STAPLE) ×1 IMPLANT
SCISSORS LAP 5X35 DISP (ENDOMECHANICALS) IMPLANT
SET IRRIG TUBING LAPAROSCOPIC (IRRIGATION / IRRIGATOR) ×1 IMPLANT
SHEARS FOC LG CVD HARMONIC 17C (MISCELLANEOUS) ×1 IMPLANT
SLEEVE ENDOPATH XCEL 5M (ENDOMECHANICALS) ×2 IMPLANT
SPECIMEN JAR LARGE (MISCELLANEOUS) ×1 IMPLANT
SPECIMEN JAR X LARGE (MISCELLANEOUS) ×1 IMPLANT
SPONGE LAP 18X18 X RAY DECT (DISPOSABLE) ×3 IMPLANT
STAPLER PROXIMATE 75MM BLUE (STAPLE) ×1 IMPLANT
STAPLER RELOAD LINEAR PROX 30 (STAPLE) ×2
STAPLER RELOADABLE 30 BLU REG (STAPLE) IMPLANT
STAPLER VISISTAT 35W (STAPLE) ×2 IMPLANT
SUCTION POOLE TIP (SUCTIONS) ×1 IMPLANT
SUT ETHILON 2 0 FS 18 (SUTURE) ×4 IMPLANT
SUT MNCRL AB 4-0 PS2 18 (SUTURE) ×1 IMPLANT
SUT PDS AB 1 TP1 96 (SUTURE) ×2 IMPLANT
SUT PDS AB 3-0 SH 27 (SUTURE) ×4 IMPLANT
SUT SILK 2 0 SH CR/8 (SUTURE) ×3 IMPLANT
SUT SILK 2 0 TIES 10X30 (SUTURE) ×2 IMPLANT
SUT SILK 3 0 SH CR/8 (SUTURE) ×1 IMPLANT
SUT SILK 3 0 TIES 10X30 (SUTURE) ×1 IMPLANT
SYR CONTROL 10ML LL (SYRINGE) ×1 IMPLANT
SYRINGE IRR TOOMEY STRL 70CC (SYRINGE) ×1 IMPLANT
TOWEL OR 17X24 6PK STRL BLUE (TOWEL DISPOSABLE) ×1 IMPLANT
TOWEL OR 17X26 10 PK STRL BLUE (TOWEL DISPOSABLE) ×2 IMPLANT
TRAY FOLEY CATH 14FRSI W/METER (CATHETERS) ×1 IMPLANT
TRAY FOLEY W/METER SILVER 16FR (SET/KITS/TRAYS/PACK) ×1 IMPLANT
TRAY LAPAROSCOPIC MC (CUSTOM PROCEDURE TRAY) ×1 IMPLANT
TROCAR XCEL BLUNT TIP 100MML (ENDOMECHANICALS) IMPLANT
TROCAR XCEL NON-BLD 11X100MML (ENDOMECHANICALS) IMPLANT
TROCAR XCEL NON-BLD 5MMX100MML (ENDOMECHANICALS) ×2 IMPLANT
TUBE MOSS GAS 18FR (TUBING) ×1 IMPLANT
TUBING INSUFFLATION (TUBING) ×2 IMPLANT
TUNNELER SHEATH ON-Q 16GX12 DP (PAIN MANAGEMENT) ×1 IMPLANT
WATER STERILE IRR 1000ML POUR (IV SOLUTION) ×1 IMPLANT
YANKAUER SUCT BULB TIP NO VENT (SUCTIONS) ×1 IMPLANT

## 2016-03-25 NOTE — H&P (Signed)
Jacob [name] 03/10/2016 12:03 PM Location: [facility] Patient #: [id number] DOB: 10-10-1945 Married / Language: English / Race: Black or African American Male   History of Present Illness [name] MD; 03/21/2016 12:12 PM) The patient is a 71 year old male who presents for a follow-up for Gastric cancer. [name] referred by Dr. Alen Blew with a diagnosis of gastric cancer found October 2017. He was been being followed for prostate cancer diagnosed in 2013. He received radiation treatment as definitive therapy for that. He was seen to be anemic and early 2017 and had a bone marrow biopsy. 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 He got iron infusion in April and September. He was hospitalized on October 14 after presenting to the emergency department with melena. EGD was performed which showed a partially circumferential large antral mass. Biopsies were positive for adenocarcinoma. Endoscopic ultrasound was also performed which did not show any evidence of linitis plastica. It was staged as a uT3N2 cancer. He did have some weight loss of 15-20 pounds during that time.   Family history includes some type of "stomach cancer" in his mother. He does not know that it was in the organ of the stomach, but does know is was something in her abdomen.  He presents for follow up after FOLFOX neoadjuvant chemotherapy. He had some thrombocytopenia and had a delay for a cycle. His appetite improved while on chemotherapy. His dizziness has still been a problem. He has not had neuropathy. Restaging scans do not show metastatic disease.   Restaging PET 02/07/2016 IMPRESSION: 1. Reduction in size and activity associated with the mass of the gastric antrum. Size measurements are obtained by the region of abnormal SUV, rather than a direct anatomic measurement of the lesion, and as such may not be highly accurate, but seen to indicate that the lesion measures  about 14% of the volume that it previously measured. SUV decreased from 49.7 to 27.7. 2. No regional adenopathy identified. 3. 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 Right renal cysts including a Bosniak category 2 cyst. Aortoiliac atherosclerotic vascular disease. Mild cardiomegaly. 4. Stable small nodular structure in the subcutaneous tissues the left breast is faintly metabolic with maximum SUV 2.8, likely a sebaceous cyst or similar benign lesion.    Allergies Malachy Moan, [place]; 03/10/2016 12:04 PM) No Known Allergies 03/10/2016  Medication History Malachy Moan, [place]; 03/10/2016 12:04 PM) Allopurinol (300MG Tablet, Oral) Active. BuPROPion HCl ER (XL) (300MG Tablet ER 24HR, Oral) Active. DiazePAM (5MG Tablet, Oral) Active. Furosemide (80MG Tablet, Oral) Active. Gabapentin (800MG Tablet, Oral) Active. Bystolic (35WY Tablet, Oral) Active. Tamsulosin HCl (0.4MG Capsule, Oral) Active. TraZODone HCl (50MG Tablet, Oral) Active. Verapamil HCl ER (300MG Capsule ER 24HR, Oral) Active. Glucerna 1.0 Cal (Oral) Specific strength unknown - Active. Xalatan (0.005% Solution, Ophthalmic) Active. Melatonin ER (10MG Tablet ER, Oral) Active. Omeprazole (Oral) Specific strength unknown - Active. Medications Reconciled    Review of Systems [name] MD; 03/21/2016 12:12 PM) All other systems negative  Vitals Malachy Moan RMA; 03/10/2016 12:04 PM) 03/10/2016 12:04 PM Weight: 268 lb Height: 74in Body Surface Area: 2.46 m Body Mass Index: 34.41 kg/m  Temp.: 98.6F  Pulse: 67 (Regular)  BP: 160/80 (Sitting, Left Arm, Standard)       Physical Exam [name] MD; 03/21/2016 12:12 PM) General Mental Status-Alert. General Appearance-Consistent with stated age. Hydration-Well hydrated. Voice-Normal.  Head and  Neck Head-normocephalic, atraumatic  with no lesions or palpable masses.  Eye Sclera/Conjunctiva - Bilateral-No scleral icterus.  Chest and Lung Exam Chest and lung exam reveals -quiet, even and easy respiratory effort with no use of accessory muscles. Inspection Chest Wall - Normal. Back - normal.  Breast - Did not examine.  Cardiovascular Cardiovascular examination reveals -normal pedal pulses bilaterally. Note: regular rate and rhythm  Abdomen Inspection-Inspection Normal. Palpation/Percussion Palpation and Percussion of the abdomen reveal - Soft, Non Tender, No Rebound tenderness, No Rigidity (guarding) and No hepatosplenomegaly.  Peripheral Vascular Upper Extremity Inspection - Bilateral - Normal - No Clubbing, No Cyanosis, No Edema, Pulses Intact. Lower Extremity Palpation - Edema - Bilateral - No edema.  Neurologic Neurologic evaluation reveals -alert and oriented x 3 with no impairment of recent or remote memory. Mental Status-Normal.  Musculoskeletal Global Assessment -Note: no gross deformities.  Normal Exam - Left-Upper Extremity Strength Normal and Lower Extremity Strength Normal. Normal Exam - Right-Upper Extremity Strength Normal and Lower Extremity Strength Normal.  Lymphatic Head & Neck  General Head & Neck Lymphatics: Bilateral - Description - Normal. Axillary  General Axillary Region: Bilateral - Description - Normal. Tenderness - Non Tender.    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 Current Plans Started Iron (Ferrous Sulfate) 142 (45 Fe)MG, 2 (two) Tablet bid, #120, 03/10/2016, Ref. x2. 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  Will set up for dx laparoscopy, distal gastrectomy, feeding tube. Discussed surgery extensively. Reviewed risks  including bleeding, infection, damage to adjacent structures, heart/lung complications, delayed gastric emptying, death, blood clot, etc.  Reviewed post op restrictions and recovery time as well as time in hospital.  28 min spent in evaluation, examination, counseling, and coordination of care. >50% spent in counseling. Current Plans Schedule for Surgery   Signed by [name], MD (03/21/2016 12:16 PM)

## 2016-03-25 NOTE — Progress Notes (Signed)
Received pt from PACU, drowsy, responsive. Abd dressings dry and intact. G-J tube connected to drainage bag, w/ scant bloody output noted. Instructed pt how to use PCA for pain.

## 2016-03-25 NOTE — Anesthesia Postprocedure Evaluation (Signed)
Anesthesia Post Note  Patient: Saed Hudlow  Procedure(s) Performed: Procedure(s) (LRB): DIAGNOSTIC LAPAROSCOPY (N/A) INSERTION OF FEEDING TUBE (N/A) DISTAL GASTRECTOMY (N/A)  Patient location during evaluation: PACU Anesthesia Type: General Level of consciousness: awake, awake and alert and oriented Pain management: pain level controlled Vital Signs Assessment: post-procedure vital signs reviewed and stable Respiratory status: spontaneous breathing, nonlabored ventilation, respiratory function stable and patient connected to nasal cannula oxygen Cardiovascular status: blood pressure returned to baseline Anesthetic complications: no       Last Vitals:  Vitals:   03/25/16 1412 03/25/16 1449  BP: 140/82 140/80  Pulse: 73 72  Resp: 13   Temp:  36.5 C    Last Pain:  Vitals:   03/25/16 1449  TempSrc: Oral  PainSc:                  Lorian Yaun COKER

## 2016-03-25 NOTE — Transfer of Care (Signed)
Immediate Anesthesia Transfer of Care Note  Patient: Jacob [name]  Procedure(s) Performed: Procedure(s): DIAGNOSTIC LAPAROSCOPY (N/A) INSERTION OF FEEDING TUBE (N/A) DISTAL GASTRECTOMY (N/A)  Patient Location: PACU  Anesthesia Type:General  Level of Consciousness: lethargic and responds to stimulation, resting comfortably  Airway & Oxygen Therapy: Patient Spontanous Breathing and Patient connected to nasal cannula oxygen  Post-op Assessment: Report given to RN and Post -op Vital signs reviewed and stable  Post vital signs: Reviewed and stable  Last Vitals:  Vitals:   03/25/16 0719 03/25/16 1229  BP: (!) 160/84   Pulse: 76   Resp: 18   Temp: 36.9 C 36.6 C    Last Pain:  Vitals:   03/25/16 0719  TempSrc: Oral         Complications: No apparent anesthesia complications

## 2016-03-25 NOTE — Anesthesia Preprocedure Evaluation (Addendum)
Anesthesia Evaluation  Patient identified by MRN, date of birth, ID band Patient awake    Reviewed: Allergy & Precautions, NPO status , Patient's Chart, lab work & pertinent test results  Airway Mallampati: II  TM Distance: >3 FB Neck ROM: Full    Dental  (+) Edentulous Upper   Pulmonary former smoker,    breath sounds clear to auscultation       Cardiovascular hypertension,  Rhythm:Regular Rate:Normal     Neuro/Psych    GI/Hepatic   Endo/Other    Renal/GU      Musculoskeletal   Abdominal (+) + obese,   Peds  Hematology   Anesthesia Other Findings   Reproductive/Obstetrics                           Anesthesia Physical Anesthesia Plan  ASA: III  Anesthesia Plan: General   Post-op Pain Management:    Induction: Intravenous  Airway Management Planned: Oral ETT  Additional Equipment:   Intra-op Plan:   Post-operative Plan: Extubation in OR  Informed Consent: I have reviewed the patients History and Physical, chart, labs and discussed the procedure including the risks, benefits and alternatives for the proposed anesthesia with the patient or authorized representative who has indicated his/her understanding and acceptance.     Plan Discussed with: CRNA and Anesthesiologist  Anesthesia Plan Comments: ( Gastric cancer Chronic anemia Renal insufficiency Cr. 1.71 Hypertension  Plan GA with oral ETT  Roberts Gaudy)       Anesthesia Quick Evaluation

## 2016-03-25 NOTE — Anesthesia Procedure Notes (Signed)
Procedure Name: Intubation Date/Time: 03/25/2016 10:03 AM Performed by: Salli Quarry Zettie Gootee Pre-anesthesia Checklist: Patient identified, Emergency Drugs available, Suction available and Patient being monitored Patient Re-evaluated:Patient Re-evaluated prior to inductionOxygen Delivery Method: Circle System Utilized Preoxygenation: Pre-oxygenation with 100% oxygen Intubation Type: IV induction and Cricoid Pressure applied Ventilation: Mask ventilation without difficulty and Oral airway inserted - appropriate to patient size Laryngoscope Size: Mac and 4 Grade View: Grade I Tube type: Oral Tube size: 7.5 mm Number of attempts: 1 Airway Equipment and Method: Stylet and Oral airway Placement Confirmation: ETT inserted through vocal cords under direct vision,  positive ETCO2 and breath sounds checked- equal and bilateral Secured at: 24 cm Tube secured with: Tape Dental Injury: Teeth and Oropharynx as per pre-operative assessment

## 2016-03-25 NOTE — Op Note (Signed)
PRE-OPERATIVE DIAGNOSIS: adenocarcinoma of the gastric antrum, uT3N2cM0, s/p neoadjuvant chemotherapy.    POST-OPERATIVE DIAGNOSIS:  Same  PROCEDURE:  Procedure(s): Diagnostic laparoscopy, distal gastrectomy with Billroth 2 reconstruction and gastrojejunostomy tube  SURGEON:  Surgeon(s): [name], MD  ASSISTANT:   Judyann Munson, RNFA  ANESTHESIA:   local and general  DRAINS: OnQ pain pump , Moss GJ tube, 18 Fr  LOCAL MEDICATIONS USED:  BUPIVICAINE  and LIDOCAINE   SPECIMEN:  Source of Specimen:  distal stomach  DISPOSITION OF SPECIMEN:  PATHOLOGY  COUNTS:  YES  DICTATION: .Dragon Dictation  PLAN OF CARE: Admit to inpatient   PATIENT DISPOSITION:  PACU - hemodynamically stable.  FINDINGS:  No evidence of carcinomatosis, 1 cm hemangioma in left lateral segment, mass in antrum posteriorly, several enlarged LN.    EBL: 300 mL  PROCEDURE:    The patient was identified in the holding area and was taken to the OR where he was placed supine on the operating room table.  General anesthesia was induced.  The left arm was tucked, and foley catheter was placed.  Abdomen was prepped and draped in sterile fashion.  Timeout was performed according to the surgical safety checklist.  When all was correct, we continued.    The patient was rotated to the left and placed into reverse trendelenburg position.  A 5 mm incision was made at the costal margin and a 5 mm Optiview trocar was placed under direct visualization.  Pneumoperitoneum was achieved to a pressure of 15 mm Hg.  The abdomen was examined with the camera and no evidence of carcinomatosis was seen.  An upper midline incision was made in the abdomen and the abdomen was then manually explored.  The stomach was mobile, and the pelvis and undersurface of the liver was palpated.  Again, no carcinomatosis was identified.    The Bookwalter was used to assist with visualization.  The stomach was mobilized.  The omentum was taken off  the colon and the lesser sac was entered.  This was difficult as the omentum was very large and fused in part to the mesocolon.  The duodenum was Kocherized.  The Harmonic was used to take down the gastroepiploics and the edge of the lesser curve and greater curve were exposed.  Two blue loads of the GIA 75 mm stapler were used to divide the stomach.   The harmonic was then used to dissect superiorly and inferiorly.  Once the duodenal bulb was identified, it was divided with a blue load of the TA 30.  The specimen side was transected with a #10 blade.  The margins were grossly negative and 3-5 cm from the mucosal margins.     The specimen was sent to pathology.  The staple line of the duodenum and the stomach were oversewn with 3-0 PDS suture.  An appropriate site for a GJ tube was identified and an incision made with the Bovie.  The feeding tube was advanced through the abdominal wall.  Two purse string sutures were made on the anterior stomach.    The gastrojejunostomy was then performed.  This was antecolic and retrogastric.  The jejunum was secured to the posterior wall of the stomach with two 3-0 silk sutures.  The stomach and jejunum were opened, and the 75 mm stapler was used to create a stapled linear anastamosis.  The gastrotomy was performed an the feeding tube threaded into the stomach.  The balloon was inflated in the stomach and the feeding portion advanced into  the jejunum.   The defect was closed with two 3-0 PDS sutures in connell fashion.  The stomach was pexed to the abdominal wall posteriorly with five 2-0 PDS sutures.  The flange of the GJ tube was secured to the abdominal wall with 2-0 nylon.    The abdomen was then irrigated with saline and water.  A sponge count was performed.  The OnQ tunnelers were placed on either side of the incision in the preperitoneal space.  The fascia was then closed with two #1 looped PDS sutures.  The skin was irrigated and closed with staples.  The OnQ  catheters were advanced into the tunneler sheaths and the sheaths were removed.  The abdomen was then cleaned, dried, and dressed with dry sterile dressings.    Needle, sponge, and instrument counts were correct x 2.  The patient was allowed to emerge from anesthesia and taken to the PACU in stable condition.

## 2016-03-26 ENCOUNTER — Encounter (HOSPITAL_COMMUNITY): Payer: Self-pay | Admitting: General Surgery

## 2016-03-26 LAB — CBC
HCT: 26.2 % — ABNORMAL LOW (ref 39.0–52.0)
Hemoglobin: 8.3 g/dL — ABNORMAL LOW (ref 13.0–17.0)
MCH: 31.8 pg (ref 26.0–34.0)
MCHC: 31.7 g/dL (ref 30.0–36.0)
MCV: 100.4 fL — AB (ref 78.0–100.0)
PLATELETS: 169 10*3/uL (ref 150–400)
RBC: 2.61 MIL/uL — AB (ref 4.22–5.81)
RDW: 20.6 % — ABNORMAL HIGH (ref 11.5–15.5)
WBC: 15.2 10*3/uL — ABNORMAL HIGH (ref 4.0–10.5)

## 2016-03-26 LAB — BASIC METABOLIC PANEL
Anion gap: 5 (ref 5–15)
BUN: 28 mg/dL — AB (ref 6–20)
CHLORIDE: 111 mmol/L (ref 101–111)
CO2: 22 mmol/L (ref 22–32)
Calcium: 8.7 mg/dL — ABNORMAL LOW (ref 8.9–10.3)
Creatinine, Ser: 1.94 mg/dL — ABNORMAL HIGH (ref 0.61–1.24)
GFR calc Af Amer: 39 mL/min — ABNORMAL LOW (ref >60)
GFR, EST NON AFRICAN AMERICAN: 33 mL/min — AB (ref >60)
GLUCOSE: 99 mg/dL (ref 65–99)
POTASSIUM: 5.6 mmol/L — AB (ref 3.5–5.1)
Sodium: 138 mmol/L (ref 135–145)

## 2016-03-26 LAB — GLUCOSE, CAPILLARY: Glucose-Capillary: 88 mg/dL (ref 65–99)

## 2016-03-26 MED ORDER — ORAL CARE MOUTH RINSE
15.0000 mL | Freq: Two times a day (BID) | OROMUCOSAL | Status: DC
  Administered 2016-03-26 – 2016-04-07 (×23): 15 mL via OROMUCOSAL

## 2016-03-26 MED ORDER — ALBUMIN HUMAN 5 % IV SOLN
25.0000 g | Freq: Once | INTRAVENOUS | Status: AC
  Administered 2016-03-26: 25 g via INTRAVENOUS
  Filled 2016-03-26: qty 500

## 2016-03-26 MED ORDER — JEVITY 1.5 CAL/FIBER PO LIQD
1000.0000 mL | ORAL | Status: DC
  Administered 2016-03-26: 1000 mL
  Filled 2016-03-26 (×3): qty 1000

## 2016-03-26 MED ORDER — SODIUM CHLORIDE 0.9 % IV SOLN
INTRAVENOUS | Status: DC
  Administered 2016-03-26 – 2016-03-28 (×4): via INTRAVENOUS

## 2016-03-26 MED ORDER — VITAL HIGH PROTEIN PO LIQD: 1000.0000 mL | ORAL | Status: DC

## 2016-03-26 NOTE — Progress Notes (Signed)
Initial Nutrition Assessment  DOCUMENTATION CODES:   Obesity unspecified  INTERVENTION:   -Initiate trickle feeds of Jevity 1.5 @ 10 ml/hr via j-port   Tube feeding regimen provides 360 kcal (18% of needs), 15 grams of protein, and 274 ml of H2O.   When pt is able to tolerate advancement of TF, recommend:  -Increase by 10 ml every 12 hours to goal rate of 50 ml/hr.   60 ml Prostat BID.    Tube feeding regimen provides 2200 kcal (100% of needs), 137 grams of protein, and 912 ml of H2O.   NUTRITION DIAGNOSIS:   Inadequate oral intake related to altered GI function as evidenced by NPO status.  GOAL:   Patient will meet greater than or equal to 90% of their needs  MONITOR:   Labs, Weight trends, TF tolerance, Skin, I & O's  REASON FOR ASSESSMENT:   Consult Enteral/tube feeding initiation and management  ASSESSMENT:   [name] referred by Dr. Alen Blew with a diagnosis of gastric cancer found October 2017. He was been being followed for prostate cancer diagnosed in 2013. He received radiation treatment as definitive therapy for that. He was seen to be anemic and early 2017 and had a bone marrow biopsy. 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   Pt admitted with gastric cancer.  S/p Procedure(s) on 03/25/16: Diagnostic laparoscopy, distal gastrectomy with Billroth 2 reconstruction and gastrojejunostomy tube  Spoke with pt at bedside, who reports he is feeling well after surgery. He reports good appetite PTA, generally consuming 5-6 small meals daily (such as sandwiches). He denies any recent changes to intake over the past several months. Pt shares his UBW is around 260#. He denies any recent wt loss, however, shares that he has experienced a 30# intentional wt loss over the past few years since retirement, which he attributes to cessation of drinking beer and eating more healthfully.   Nutrition-Focused physical exam completed. Findings are no fat  depletion, no muscle depletion, and no edema.   Reviewed surgeon notes from this AM; plan for g-port to gravity and start trickle feeds via j-port at 10 ml/hr. Pt aware of plan.   Labs reviewed.  Diet Order:  Diet NPO time specified  Skin:   (closed abdominal incision)  Last BM:  PTA  Height:   Ht Readings from Last 1 Encounters:  03/25/16 _0  (1.88 m)    Weight:   Wt Readings from Last 1 Encounters:  03/25/16 274 lb (124.3 kg)    Ideal Body Weight:  86.4 kg  BMI:  Body mass index is 35.18 kg/m.  Estimated Nutritional Needs:   Kcal:  2000-2200  Protein:  125-150 grams  Fluid:  1.9-2.1 L  EDUCATION NEEDS:   No education needs identified at this time  Jacob [name] A. Jimmye Norman, RD, LDN, CDE Pager: [phone] After hours Pager: [phone]

## 2016-03-26 NOTE — Progress Notes (Signed)
1 Day Post-Op  Subjective: Sore.  No n/v.  BP ok overnight.    Objective: Vital signs in last 24 hours: Temp:  [97.7 F (36.5 C)-98.4 F (36.9 C)] 98.4 F (36.9 C) (03/14 0614) Pulse Rate:  [68-81] 78 (03/14 0614) Resp:  [11-20] 20 (03/14 0752) BP: (131-154)/(74-86) 131/74 (03/14 0614) SpO2:  [91 %-100 %] 97 % (03/14 0752) Weight:  [124.3 kg (274 lb)] 124.3 kg (274 lb) (03/13 2212)    Intake/Output from previous day: 03/13 0701 - 03/14 0700 In: 2200 [I.V.:2200] Out: 1710 [Urine:1410; Blood:300] Intake/Output this shift: No intake/output data recorded.  General appearance: alert, cooperative and no distress Resp: breathing comfortably GI: soft, non distended, approp tender.    Lab Results:   Recent Labs  03/25/16 1552  WBC 16.7*  HGB 9.0*  HCT 28.0*  PLT 164   BMET  Recent Labs  03/25/16 1552  CREATININE 1.77*   PT/INR No results for input(s): LABPROT, INR in the last 72 hours. ABG No results for input(s): PHART, HCO3 in the last 72 hours.  Invalid input(s): PCO2, PO2  Studies/Results: No results found.  Anti-infectives: Anti-infectives    Start     Dose/Rate Route Frequency Ordered Stop   03/25/16 1800  ceFAZolin (ANCEF) IVPB 2g/100 mL premix     2 g 200 mL/hr over 30 Minutes Intravenous Every 8 hours 03/25/16 1450 03/25/16 1824   03/25/16 0830  ceFAZolin (ANCEF) 3 g in dextrose 5 % 50 mL IVPB     3 g 130 mL/hr over 30 Minutes Intravenous To ShortStay Surgical 03/24/16 1205 03/25/16 1012      Assessment/Plan: s/p Procedure(s): DIAGNOSTIC LAPAROSCOPY (N/A) INSERTION OF FEEDING TUBE (N/A) DISTAL GASTRECTOMY (N/A) leave foley until tomorrow.  NPO/G tube to gravity. Nutrition consult Start tube feeds later today at 10 ml/hr. IV metoprolol and hydralazine PRN for BP.  IV lasix as replacement for home PO. Slight increase in creatinine.  Add albumin.   Await pathology   LOS: 1 day    [facility] 03/26/2016

## 2016-03-27 LAB — GLUCOSE, CAPILLARY
GLUCOSE-CAPILLARY: 99 mg/dL (ref 65–99)
GLUCOSE-CAPILLARY: 100 mg/dL — AB (ref 65–99)
GLUCOSE-CAPILLARY: 90 mg/dL (ref 65–99)
GLUCOSE-CAPILLARY: 104 mg/dL — AB (ref 65–99)
Glucose-Capillary: 113 mg/dL — ABNORMAL HIGH (ref 65–99)

## 2016-03-27 LAB — CBC
HCT: 24.3 % — ABNORMAL LOW (ref 39.0–52.0)
HEMOGLOBIN: 7.6 g/dL — AB (ref 13.0–17.0)
MCH: 31.9 pg (ref 26.0–34.0)
MCHC: 31.3 g/dL (ref 30.0–36.0)
MCV: 102.1 fL — ABNORMAL HIGH (ref 78.0–100.0)
PLATELETS: 151 10*3/uL (ref 150–400)
RBC: 2.38 MIL/uL — ABNORMAL LOW (ref 4.22–5.81)
RDW: 21 % — ABNORMAL HIGH (ref 11.5–15.5)
WBC: 9.9 10*3/uL (ref 4.0–10.5)

## 2016-03-27 LAB — BASIC METABOLIC PANEL
Anion gap: 5 (ref 5–15)
BUN: 29 mg/dL — AB (ref 6–20)
CALCIUM: 9.1 mg/dL (ref 8.9–10.3)
CHLORIDE: 112 mmol/L — AB (ref 101–111)
CO2: 23 mmol/L (ref 22–32)
CREATININE: 1.88 mg/dL — AB (ref 0.61–1.24)
GFR, EST AFRICAN AMERICAN: 40 mL/min — AB (ref >60)
GFR, EST NON AFRICAN AMERICAN: 35 mL/min — AB (ref >60)
Glucose, Bld: 90 mg/dL (ref 65–99)
Potassium: 5 mmol/L (ref 3.5–5.1)
SODIUM: 140 mmol/L (ref 135–145)

## 2016-03-27 MED ORDER — SODIUM BICARBONATE 650 MG PO TABS
650.0000 mg | ORAL_TABLET | Freq: Once | ORAL | Status: DC
  Filled 2016-03-27: qty 1

## 2016-03-27 MED ORDER — PANCRELIPASE (LIP-PROT-AMYL) 12000-38000 UNITS PO CPEP
12000.0000 [IU] | ORAL_CAPSULE | Freq: Once | ORAL | Status: DC
  Filled 2016-03-27: qty 1

## 2016-03-27 NOTE — Progress Notes (Addendum)
2 Days Post-Op  Subjective: Had n/v yesterday.  J tube clogged for unclear reasons.    Objective: Vital signs in last 24 hours: Temp:  [97.6 F (36.4 C)-98.7 F (37.1 C)] 98.2 F (36.8 C) (03/15 0505) Pulse Rate:  [86-96] 96 (03/15 0505) Resp:  [16-20] 16 (03/15 0835) BP: (150-162)/(78-96) 158/78 (03/15 0505) SpO2:  [1 %-100 %] 94 % (03/15 0835) Weight:  [123 kg (271 lb 2.7 oz)] 123 kg (271 lb 2.7 oz) (03/15 0505) Last BM Date: 03/24/16  Intake/Output from previous day: 03/14 0701 - 03/15 0700 In: 1150 [I.V.:1150] Out: 3700 [Urine:3375; Drains:325] Intake/Output this shift: No intake/output data recorded.  General appearance: alert, cooperative and no distress Resp: breathing comfortably GI: soft, non distended, approp tender.    Lab Results:   Recent Labs  03/26/16 0808 03/27/16 0417  WBC 15.2* 9.9  HGB 8.3* 7.6*  HCT 26.2* 24.3*  PLT 169 151   BMET  Recent Labs  03/26/16 0808 03/27/16 0417  NA 138 140  K 5.6* 5.0  CL 111 112*  CO2 22 23  GLUCOSE 99 90  BUN 28* 29*  CREATININE 1.94* 1.88*  CALCIUM 8.7* 9.1   PT/INR No results for input(s): LABPROT, INR in the last 72 hours. ABG No results for input(s): PHART, HCO3 in the last 72 hours.  Invalid input(s): PCO2, PO2  Studies/Results: No results found.  Anti-infectives: Anti-infectives    Start     Dose/Rate Route Frequency Ordered Stop   03/25/16 1800  ceFAZolin (ANCEF) IVPB 2g/100 mL premix     2 g 200 mL/hr over 30 Minutes Intravenous Every 8 hours 03/25/16 1450 03/25/16 1824   03/25/16 0830  ceFAZolin (ANCEF) 3 g in dextrose 5 % 50 mL IVPB     3 g 130 mL/hr over 30 Minutes Intravenous To ShortStay Surgical 03/24/16 1205 03/25/16 1012      Assessment/Plan: s/p Procedure(s): DIAGNOSTIC LAPAROSCOPY (N/A) INSERTION OF FEEDING TUBE (N/A) DISTAL GASTRECTOMY (N/A) leave foley until tomorrow.  Class III chronic kidney disease.  NPO/G tube to gravity. Nutrition consult Start tube feeds  later today at 10 ml/hr if we can get J tube unclogged.   IV metoprolol and hydralazine PRN for BP.  IV lasix as replacement for home PO. Creatinine stable. Await pathology.   LOS: 2 days    [facility] 03/27/2016

## 2016-03-27 NOTE — Progress Notes (Signed)
Tube feeding heldf because of clog in J port,unable to flush and irrigate  per day shift,  tube feeding pump continues to indicate clog in tubing.

## 2016-03-27 NOTE — Progress Notes (Signed)
1100 J tube is clogged. Starting to do the unclogging protocol with Creon. First flush of lukewarm water, the side of the tube broke. Dr Barry Dienes notified, tape applied. Tube feeding on hold.

## 2016-03-28 LAB — BASIC METABOLIC PANEL
ANION GAP: 5 (ref 5–15)
BUN: 33 mg/dL — ABNORMAL HIGH (ref 6–20)
CALCIUM: 9 mg/dL (ref 8.9–10.3)
CO2: 23 mmol/L (ref 22–32)
Chloride: 112 mmol/L — ABNORMAL HIGH (ref 101–111)
Creatinine, Ser: 2 mg/dL — ABNORMAL HIGH (ref 0.61–1.24)
GFR calc non Af Amer: 32 mL/min — ABNORMAL LOW (ref >60)
GFR, EST AFRICAN AMERICAN: 37 mL/min — AB (ref >60)
GLUCOSE: 98 mg/dL (ref 65–99)
POTASSIUM: 4.7 mmol/L (ref 3.5–5.1)
SODIUM: 140 mmol/L (ref 135–145)

## 2016-03-28 LAB — GLUCOSE, CAPILLARY
GLUCOSE-CAPILLARY: 103 mg/dL — AB (ref 65–99)
GLUCOSE-CAPILLARY: 108 mg/dL — AB (ref 65–99)
GLUCOSE-CAPILLARY: 111 mg/dL — AB (ref 65–99)
GLUCOSE-CAPILLARY: 113 mg/dL — AB (ref 65–99)
Glucose-Capillary: 138 mg/dL — ABNORMAL HIGH (ref 65–99)
Glucose-Capillary: 141 mg/dL — ABNORMAL HIGH (ref 65–99)

## 2016-03-28 LAB — CBC
HCT: 23.7 % — ABNORMAL LOW (ref 39.0–52.0)
Hemoglobin: 7.5 g/dL — ABNORMAL LOW (ref 13.0–17.0)
MCH: 31.8 pg (ref 26.0–34.0)
MCHC: 31.6 g/dL (ref 30.0–36.0)
MCV: 100.4 fL — AB (ref 78.0–100.0)
Platelets: 159 10*3/uL (ref 150–400)
RBC: 2.36 MIL/uL — AB (ref 4.22–5.81)
RDW: 20.2 % — ABNORMAL HIGH (ref 11.5–15.5)
WBC: 13.1 10*3/uL — AB (ref 4.0–10.5)

## 2016-03-28 MED ORDER — VERAPAMIL HCL ER 300 MG PO CP24: 300.0000 mg | ORAL_CAPSULE | Freq: Every day | ORAL | Status: DC

## 2016-03-28 MED ORDER — GABAPENTIN 800 MG PO TABS
800.0000 mg | ORAL_TABLET | Freq: Every day | ORAL | Status: DC
  Filled 2016-03-28: qty 1

## 2016-03-28 MED ORDER — FUROSEMIDE 40 MG PO TABS
40.0000 mg | ORAL_TABLET | ORAL | Status: DC
  Administered 2016-03-28 – 2016-04-07 (×5): 40 mg via ORAL
  Filled 2016-03-28 (×7): qty 1

## 2016-03-28 MED ORDER — NEBIVOLOL HCL 10 MG PO TABS
20.0000 mg | ORAL_TABLET | Freq: Every day | ORAL | Status: DC
Start: 2016-03-28 — End: 2016-04-07
  Administered 2016-03-28 – 2016-04-07 (×10): 20 mg via ORAL
  Filled 2016-03-28 (×11): qty 2

## 2016-03-28 MED ORDER — TRAZODONE HCL 50 MG PO TABS
50.0000 mg | ORAL_TABLET | Freq: Every day | ORAL | Status: DC
Start: 2016-03-28 — End: 2016-04-07
  Administered 2016-03-28 – 2016-04-06 (×10): 50 mg via ORAL
  Filled 2016-03-28 (×10): qty 1

## 2016-03-28 MED ORDER — VERAPAMIL HCL ER 180 MG PO TBCR
300.0000 mg | EXTENDED_RELEASE_TABLET | Freq: Every day | ORAL | Status: DC
  Administered 2016-03-28 – 2016-04-06 (×10): 300 mg via ORAL
  Filled 2016-03-28 (×11): qty 1

## 2016-03-28 MED ORDER — GABAPENTIN 400 MG PO CAPS
800.0000 mg | ORAL_CAPSULE | Freq: Every day | ORAL | Status: DC
  Administered 2016-03-28 – 2016-04-06 (×10): 800 mg via ORAL
  Filled 2016-03-28 (×10): qty 2

## 2016-03-28 MED ORDER — BUPROPION HCL ER (XL) 150 MG PO TB24
300.0000 mg | ORAL_TABLET | Freq: Every day | ORAL | Status: DC
  Administered 2016-03-28 – 2016-04-07 (×10): 300 mg via ORAL
  Filled 2016-03-28 (×11): qty 2

## 2016-03-28 MED ORDER — ALLOPURINOL 300 MG PO TABS
300.0000 mg | ORAL_TABLET | Freq: Every day | ORAL | Status: DC
  Administered 2016-03-28 – 2016-04-07 (×10): 300 mg via ORAL
  Filled 2016-03-28 (×11): qty 1

## 2016-03-28 MED ORDER — TAMSULOSIN HCL 0.4 MG PO CAPS
0.4000 mg | ORAL_CAPSULE | Freq: Every day | ORAL | Status: DC
  Administered 2016-03-28 – 2016-04-07 (×10): 0.4 mg via ORAL
  Filled 2016-03-28 (×11): qty 1

## 2016-03-28 NOTE — Progress Notes (Signed)
Temperature (100.3) rechecked, incentive spirometry encouraged.

## 2016-03-28 NOTE — Progress Notes (Signed)
Patient sitting up in chair watching TV.  States that pain is 2/10, patient has Dilaudid PCA pump.  O2 level stable, no signs or symptoms of distress noted.

## 2016-03-28 NOTE — Progress Notes (Signed)
3 Days Post-Op  Subjective: J tube side port blew out in attempts to unclog.  Pt denies nausea/vomiting.  Pain well controlled.  No flatus yet.    Objective: Vital signs in last 24 hours: Temp:  [97 F (36.1 C)-100.9 F (38.3 C)] 98.8 F (37.1 C) (03/16 1049) Pulse Rate:  [97-106] 100 (03/16 1049) Resp:  [11-20] 11 (03/16 0923) BP: (137-175)/(78-96) 164/96 (03/16 1049) SpO2:  [93 %-97 %] 96 % (03/16 1049) Weight:  [120.8 kg (266 lb 5.1 oz)] 120.8 kg (266 lb 5.1 oz) (03/16 0449) Last BM Date: 03/24/16  Intake/Output from previous day: 03/15 0701 - 03/16 0700 In: 600 [I.V.:600] Out: 2775 [Urine:2550; Drains:225] Intake/Output this shift: Total I/O In: -  Out: 475 [Urine:375; Drains:100]  General appearance: alert, cooperative and no distress Resp: breathing comfortably GI: soft, non distended, approp tender.   G tube slightly bilious.  Low volume.  Lab Results:   Recent Labs  03/27/16 0417 03/28/16 0528  WBC 9.9 13.1*  HGB 7.6* 7.5*  HCT 24.3* 23.7*  PLT 151 159   BMET  Recent Labs  03/27/16 0417 03/28/16 0528  NA 140 140  K 5.0 4.7  CL 112* 112*  CO2 23 23  GLUCOSE 90 98  BUN 29* 33*  CREATININE 1.88* 2.00*  CALCIUM 9.1 9.0   PT/INR No results for input(s): LABPROT, INR in the last 72 hours. ABG No results for input(s): PHART, HCO3 in the last 72 hours.  Invalid input(s): PCO2, PO2  Studies/Results: No results found.  Anti-infectives: Anti-infectives    Start     Dose/Rate Route Frequency Ordered Stop   03/25/16 1800  ceFAZolin (ANCEF) IVPB 2g/100 mL premix     2 g 200 mL/hr over 30 Minutes Intravenous Every 8 hours 03/25/16 1450 03/25/16 1824   03/25/16 0830  ceFAZolin (ANCEF) 3 g in dextrose 5 % 50 mL IVPB     3 g 130 mL/hr over 30 Minutes Intravenous To ShortStay Surgical 03/24/16 1205 03/25/16 1012      Assessment/Plan: s/p Procedure(s): DIAGNOSTIC LAPAROSCOPY (N/A) INSERTION OF FEEDING TUBE (N/A) DISTAL GASTRECTOMY (N/A)  Class  III chronic kidney disease.  Clamp g tube. Clears  Will have to wait to switch out feeding tube for 2 weeks post op. Start oral BP meds today.   D/c foley  Creatinine up a bit more.    Await pathology.   LOS: 3 days    [facility] 03/28/2016

## 2016-03-29 LAB — GLUCOSE, CAPILLARY
GLUCOSE-CAPILLARY: 110 mg/dL — AB (ref 65–99)
GLUCOSE-CAPILLARY: 135 mg/dL — AB (ref 65–99)
Glucose-Capillary: 109 mg/dL — ABNORMAL HIGH (ref 65–99)
Glucose-Capillary: 132 mg/dL — ABNORMAL HIGH (ref 65–99)
Glucose-Capillary: 135 mg/dL — ABNORMAL HIGH (ref 65–99)
Glucose-Capillary: 97 mg/dL (ref 65–99)

## 2016-03-29 LAB — BPAM RBC
BLOOD PRODUCT EXPIRATION DATE: 201804062359
Blood Product Expiration Date: 201804062359
Unit Type and Rh: 5100
Unit Type and Rh: 5100

## 2016-03-29 LAB — URINALYSIS, ROUTINE W REFLEX MICROSCOPIC
Bacteria, UA: NONE SEEN
Bilirubin Urine: NEGATIVE
GLUCOSE, UA: NEGATIVE mg/dL
HGB URINE DIPSTICK: NEGATIVE
KETONES UR: NEGATIVE mg/dL
LEUKOCYTES UA: NEGATIVE
Nitrite: NEGATIVE
PROTEIN: 100 mg/dL — AB
Specific Gravity, Urine: 1.016 (ref 1.005–1.030)
Squamous Epithelial / LPF: NONE SEEN
pH: 5 (ref 5.0–8.0)

## 2016-03-29 LAB — TYPE AND SCREEN
ABO/RH(D): O POS
Antibody Screen: NEGATIVE
UNIT DIVISION: 0
Unit division: 0

## 2016-03-29 LAB — BASIC METABOLIC PANEL
ANION GAP: 4 — AB (ref 5–15)
BUN: 36 mg/dL — ABNORMAL HIGH (ref 6–20)
CALCIUM: 9.2 mg/dL (ref 8.9–10.3)
CO2: 24 mmol/L (ref 22–32)
CREATININE: 2.1 mg/dL — AB (ref 0.61–1.24)
Chloride: 111 mmol/L (ref 101–111)
GFR, EST AFRICAN AMERICAN: 35 mL/min — AB (ref >60)
GFR, EST NON AFRICAN AMERICAN: 30 mL/min — AB (ref >60)
GLUCOSE: 118 mg/dL — AB (ref 65–99)
Potassium: 4.2 mmol/L (ref 3.5–5.1)
Sodium: 139 mmol/L (ref 135–145)

## 2016-03-29 LAB — CBC
HCT: 21.9 % — ABNORMAL LOW (ref 39.0–52.0)
Hemoglobin: 7.1 g/dL — ABNORMAL LOW (ref 13.0–17.0)
MCH: 32.6 pg (ref 26.0–34.0)
MCHC: 32.4 g/dL (ref 30.0–36.0)
MCV: 100.5 fL — ABNORMAL HIGH (ref 78.0–100.0)
PLATELETS: 156 10*3/uL (ref 150–400)
RBC: 2.18 MIL/uL — ABNORMAL LOW (ref 4.22–5.81)
RDW: 20.3 % — AB (ref 11.5–15.5)
WBC: 11.1 10*3/uL — AB (ref 4.0–10.5)

## 2016-03-29 LAB — IRON AND TIBC
Iron: 12 ug/dL — ABNORMAL LOW (ref 45–182)
Saturation Ratios: 4 % — ABNORMAL LOW (ref 17.9–39.5)
TIBC: 274 ug/dL (ref 250–450)
UIBC: 262 ug/dL

## 2016-03-29 LAB — CREATININE, URINE, RANDOM: CREATININE, URINE: 149.46 mg/dL

## 2016-03-29 LAB — PREPARE RBC (CROSSMATCH)

## 2016-03-29 LAB — SODIUM, URINE, RANDOM: Sodium, Ur: 47 mmol/L

## 2016-03-29 LAB — FERRITIN: Ferritin: 305 ng/mL (ref 24–336)

## 2016-03-29 MED ORDER — SODIUM CHLORIDE 0.9% FLUSH
10.0000 mL | INTRAVENOUS | Status: DC | PRN
  Administered 2016-03-30 – 2016-04-01 (×2): 10 mL
  Filled 2016-03-29 (×2): qty 40

## 2016-03-29 MED ORDER — DIPHENHYDRAMINE HCL 50 MG/ML IJ SOLN
25.0000 mg | Freq: Once | INTRAMUSCULAR | Status: AC
  Administered 2016-03-29: 25 mg via INTRAVENOUS
  Filled 2016-03-29: qty 1

## 2016-03-29 MED ORDER — OXYCODONE HCL 5 MG PO TABS
5.0000 mg | ORAL_TABLET | ORAL | Status: DC | PRN
  Administered 2016-03-29 – 2016-04-06 (×12): 5 mg via ORAL
  Filled 2016-03-29 (×12): qty 1

## 2016-03-29 MED ORDER — SODIUM CHLORIDE 0.9 % IV SOLN
Freq: Once | INTRAVENOUS | Status: AC
  Administered 2016-03-29: 16:00:00 via INTRAVENOUS

## 2016-03-29 NOTE — Progress Notes (Signed)
4 Days Post-Op  Subjective: Patient sitting up in chair - minimal discomfort; good spirits G-tube was apparently never clamped yesterday - still to straight drain Tolerating clears, but most of it seems to be coming out of his G-tube Not really using much of his PCA  Objective: Vital signs in last 24 hours: Temp:  [98.4 F (36.9 C)-99.5 F (37.5 C)] 99.5 F (37.5 C) (03/17 0553) Pulse Rate:  [84-108] 84 (03/17 0553) Resp:  [11-20] 19 (03/17 0553) BP: (127-164)/(69-96) 127/69 (03/17 0553) SpO2:  [95 %-97 %] 96 % (03/17 0553) FiO2 (%):  [12 %] 12 % (03/16 1200) Weight:  [119.7 kg (263 lb 14.3 oz)] 119.7 kg (263 lb 14.3 oz) (03/17 0500) Last BM Date: 03/24/16  Intake/Output from previous day: 03/16 0701 - 03/17 0700 In: 850 [P.O.:200; I.V.:650] Out: 2750 [Urine:1400; Drains:1350] Intake/Output this shift: No intake/output data recorded.  General appearance: alert, cooperative and no distress Resp: clear to auscultation bilaterally Cardio: regular rate and rhythm, S1, S2 normal, no murmur, click, rub or gallop GI: soft; minimally distended; + BS; g-tube with clear green-tinted drainage Incision c/d/i  Lab Results:   Recent Labs  03/28/16 0528 03/29/16 0503  WBC 13.1* 11.1*  HGB 7.5* 7.1*  HCT 23.7* 21.9*  PLT 159 156   BMET  Recent Labs  03/28/16 0528 03/29/16 0503  NA 140 139  K 4.7 4.2  CL 112* 111  CO2 23 24  GLUCOSE 98 118*  BUN 33* 36*  CREATININE 2.00* 2.10*  CALCIUM 9.0 9.2   PT/INR No results for input(s): LABPROT, INR in the last 72 hours. ABG No results for input(s): PHART, HCO3 in the last 72 hours.  Invalid input(s): PCO2, PO2  Studies/Results: No results found.  Anti-infectives: Anti-infectives    Start     Dose/Rate Route Frequency Ordered Stop   03/25/16 1800  ceFAZolin (ANCEF) IVPB 2g/100 mL premix     2 g 200 mL/hr over 30 Minutes Intravenous Every 8 hours 03/25/16 1450 03/25/16 1824   03/25/16 0830  ceFAZolin (ANCEF) 3 g in  dextrose 5 % 50 mL IVPB     3 g 130 mL/hr over 30 Minutes Intravenous To ShortStay Surgical 03/24/16 1205 03/25/16 1012      Assessment/Plan: s/p Procedure(s): DIAGNOSTIC LAPAROSCOPY (N/A) INSERTION OF FEEDING TUBE (N/A) DISTAL GASTRECTOMY (N/A)   POD 4 distal gastrectomy with Billroth 2 gastrojejunostomy for antral cancer, s/p neoadjuvant chemotherapy. Gastrojejunostomy tube - j-tube portion clogged and non-functional.Will have to wait until 2 weeks to change out tube.  Clamp G tube port 03/29/16 and starting clears. Will D/C PCA and start PRN PO pain meds with IV back-up Renal consult to evaluate increasing creatinine and baseline chronic kidney disease May need transfusion soon if Hgb continues to slowly decrease - will recheck tomorrow.     LOS: 4 days    Jacob Lorenzetti K. 03/29/2016

## 2016-03-29 NOTE — Consult Note (Signed)
Reason for Consult: Acute kidney injury on chronic kidney disease stage III Referring Physician: Donnie Mesa M.D. ([facility])  HPI:  71 year old African-American man with past medical history significant for hypertension for more than 30 years, obesity, obstructive sleep apnea, dyslipidemia, gout and history of prostate cancer status post XRT in 2013 with chronic kidney disease stage III at baseline (creatinine usually 1.6-1.8 and periodically as high as 2.0). He follows up with Dr. Joelyn Oms (last seen September, 2017) for his chronic kidney disease.  He was diagnosed with gastric cancer in October 2017 and underwent FOLFOX neoadjuvant chemotherapy with reduction in size and activity of the mass and no evidence of metastasis in January and 3 days ago, underwent diagnostic laparoscopy with distal gastrectomy and Billroth II reconstruction with GJ tube placement. The jejunal tube portion apparently is clogged and nonfunctional and will be replaced in about 2 weeks.  Concern is raised with his gradually rising creatinine. His urine output has been fair at best at 1.4 L over the past 24 hours and he is net -2.0 liters since admission. His hemoglobin has decreased from 9.0 on admission to 7.1 3 days postop.  Past Medical History:  Diagnosis Date  . Anemia    hx iron deficiency  . Anxiety   . Arthritis    gout  . Back pain   . BPH with obstruction/lower urinary tract symptoms   . Chronic kidney disease    "they said I do"  . Constipation   . Depression   . ED (erectile dysfunction)   . Epigastric pain   . GERD (gastroesophageal reflux disease)   . Heartburn   . History of blood transfusion   . History of radiation therapy 11/03/11-12/29/11   prostate  . Hypercholesterolemia   . Hypertension   . Night sweats   . Prostate cancer ([place]) 08/14/11   Adenocarcinoma,gleason:3+3=6,& 3+4=7,PSA=5.66  . PUD (peptic ulcer disease)   . Sleep apnea    does not use Cpap but does use O2 @ 2l   . Stomach  cancer ([place])   . Ulcer ([place])    peptic ulcer hx    Past Surgical History:  Procedure Laterality Date  . colon polyps bx  11/24/06   colon,transverse and rectosigmoid polyps:tubular adenomas and hyperplastic polyps,no high grade dysplasia or malignancy   . duodenal bx  11/24/06   benign  . ESOPHAGOGASTRODUODENOSCOPY N/A 10/27/2015   Procedure: ESOPHAGOGASTRODUODENOSCOPY (EGD);  Surgeon: [name], MD;  Location: [facility];  Service: Endoscopy;  Laterality: N/A;  . EUS N/A 11/08/2015   Procedure: UPPER ENDOSCOPIC ULTRASOUND (EUS) RADIAL;  Surgeon: [name], MD;  Location: [facility];  Service: Endoscopy;  Laterality: N/A;  . GASTRECTOMY N/A 03/25/2016   Procedure: DISTAL GASTRECTOMY;  Surgeon: [name], MD;  Location: [facility];  Service: General;  Laterality: N/A;  . gastric bx  11/24/06   chronic active gastritis,with metaplasia and focal changaes of xanthelasma  . GASTROSTOMY N/A 03/25/2016   Procedure: INSERTION OF FEEDING TUBE;  Surgeon: [name], MD;  Location: [facility];  Service: General;  Laterality: N/A;  . INSERTION PROSTATE RADIATION SEED  12-29-11  . LAPAROSCOPIC GASTRECTOMY  03/25/2016   Diagnostic laparoscopy, distal gastrectomy with Billroth 2 reconstruction and gastrojejunostomy tube  . LAPAROSCOPY N/A 03/25/2016   Procedure: DIAGNOSTIC LAPAROSCOPY;  Surgeon: [name], MD;  Location: [facility];  Service: General;  Laterality: N/A;  . PORTACATH PLACEMENT Left 12/04/2015   Procedure: INSERTION PORT-A-CATH;  Surgeon: [name], MD;  Location: [facility];  Service: General;  Laterality: Left;  . PROSTATE BIOPSY  08/14/11   Adenocarcinoma/volume=58.51cc,gleason=3+3=6 & 3+4=7  . TONSILLECTOMY     71 years old    Family History  Problem Relation Age of Onset  . Cancer Mother     NOS  . Alcohol abuse Father   . Alcohol abuse Brother 50  . Cancer Paternal Aunt     NOS  . Colon cancer Neg Hx     Social History:  reports that he quit smoking about 15 years  ago. His smoking use included Cigarettes. He has a 45.00 pack-year smoking history. He has never used smokeless tobacco. He reports that he drinks about 0.6 oz of alcohol per week . He reports that he does not use drugs.  Allergies:  Allergies  Allergen Reactions  . No Known Allergies     Medications:  Scheduled: . allopurinol  300 mg Oral Daily  . buPROPion  300 mg Oral Daily  . furosemide  40 mg Oral QODAY  . gabapentin  800 mg Oral QHS  . heparin  5,000 Units Subcutaneous Q8H  . lipase/protease/amylase  12,000 Units Oral Once   And  . sodium bicarbonate  650 mg Oral Once  . mouth rinse  15 mL Mouth Rinse BID  . nebivolol  20 mg Oral Daily  . pantoprazole (PROTONIX) IV  40 mg Intravenous QHS  . tamsulosin  0.4 mg Oral Daily  . traZODone  50 mg Oral QHS  . verapamil  300 mg Oral QHS    BMP Latest Ref Rng & Units 03/29/2016 03/28/2016 03/27/2016  Glucose 65 - 99 mg/dL 118(H) 98 90  BUN 6 - 20 mg/dL 36(H) 33(H) 29(H)  Creatinine 0.61 - 1.24 mg/dL 2.10(H) 2.00(H) 1.88(H)  Sodium 135 - 145 mmol/L 139 140 140  Potassium 3.5 - 5.1 mmol/L 4.2 4.7 5.0  Chloride 101 - 111 mmol/L 111 112(H) 112(H)  CO2 22 - 32 mmol/L 24 23 23   Calcium 8.9 - 10.3 mg/dL 9.2 9.0 9.1   CBC Latest Ref Rng & Units 03/29/2016 03/28/2016 03/27/2016  WBC 4.0 - 10.5 K/uL 11.1(H) 13.1(H) 9.9  Hemoglobin 13.0 - 17.0 g/dL 7.1(L) 7.5(L) 7.6(L)  Hematocrit 39.0 - 52.0 % 21.9(L) 23.7(L) 24.3(L)  Platelets 150 - 400 K/uL 156 159 151     No results found.  ROS Blood pressure 127/69, pulse 84, temperature 99.5 F (37.5 C), temperature source Oral, resp. rate (!) 21, height 6\' 2"  (1.88 m), weight 119.7 kg (263 lb 14.3 oz), SpO2 96 %. Physical Exam  Nursing note and vitals reviewed. Constitutional: He is oriented to person, place, and time. He appears well-developed and well-nourished. No distress.  HENT:  Head: Normocephalic and atraumatic.  Dry oral mucosa  Eyes: EOM are normal. Pupils are equal, round, and  reactive to light. No scleral icterus.  Neck: Normal range of motion. Neck supple. No JVD present. No thyromegaly present.  Cardiovascular: Normal rate, regular rhythm and normal heart sounds.  Exam reveals no friction rub.   No murmur heard. Respiratory:  Poor inspiratory effort with diminished breath sounds over bases. No distinct rales or rhonchi  GI: Soft. He exhibits distension.  Minimally distended. Gastric tube in situ with greenish tinge fluid  Musculoskeletal: Normal range of motion. He exhibits no edema.  Neurological: He is alert and oriented to person, place, and time. No cranial nerve deficit.  Skin: Skin is warm and dry. No rash noted. No erythema.  Psychiatric: He has a normal mood and affect.  Assessment/Plan: 1.Acute kidney injury on chronic kidney disease stage III: This is most likely hemodynamically mediated given that negative fluid balance with limited intake and current GI/insensible losses. Restarted back on furosemide 40 mg every other day yesterday and medications reviewed-not on any additional agents impairing renal blood flow. He is on maintenance fluid with normal saline 50 mL per hour and I would recommend 1 unit packed red cell transfusion that I have ordered to try and improve oncotic pressure/intravascular volume/renal blood flow. I will check urinalysis and urine electrolytes today to further corroborate my suspicion. 2. Status post diagnostic laparoscopy and distal gastrectomy for gastric adenocarcinoma: Management per surgery. 3. Anemia: Secondary to gastric adenocarcinoma and possibly chronic kidney disease. Check iron studies today and give 1 unit PRBC transfusion. 4. Hypertension: Blood pressure is well controlled at this time, continue to follow  Loralei Radcliffe K. 03/29/2016, 12:29 PM

## 2016-03-30 LAB — HEMOGLOBIN AND HEMATOCRIT, BLOOD
HEMATOCRIT: 25.6 % — AB (ref 39.0–52.0)
Hemoglobin: 8.3 g/dL — ABNORMAL LOW (ref 13.0–17.0)

## 2016-03-30 LAB — CBC
HCT: 22 % — ABNORMAL LOW (ref 39.0–52.0)
HEMOGLOBIN: 7.1 g/dL — AB (ref 13.0–17.0)
MCH: 32 pg (ref 26.0–34.0)
MCHC: 32.3 g/dL (ref 30.0–36.0)
MCV: 99.1 fL (ref 78.0–100.0)
PLATELETS: 167 10*3/uL (ref 150–400)
RBC: 2.22 MIL/uL — ABNORMAL LOW (ref 4.22–5.81)
RDW: 20.3 % — ABNORMAL HIGH (ref 11.5–15.5)
WBC: 9.7 10*3/uL (ref 4.0–10.5)

## 2016-03-30 LAB — BASIC METABOLIC PANEL
Anion gap: 7 (ref 5–15)
BUN: 31 mg/dL — AB (ref 6–20)
CHLORIDE: 109 mmol/L (ref 101–111)
CO2: 23 mmol/L (ref 22–32)
CREATININE: 1.92 mg/dL — AB (ref 0.61–1.24)
Calcium: 9.1 mg/dL (ref 8.9–10.3)
GFR calc Af Amer: 39 mL/min — ABNORMAL LOW (ref >60)
GFR, EST NON AFRICAN AMERICAN: 34 mL/min — AB (ref >60)
GLUCOSE: 103 mg/dL — AB (ref 65–99)
Potassium: 3.8 mmol/L (ref 3.5–5.1)
Sodium: 139 mmol/L (ref 135–145)

## 2016-03-30 LAB — GLUCOSE, CAPILLARY
GLUCOSE-CAPILLARY: 95 mg/dL (ref 65–99)
GLUCOSE-CAPILLARY: 82 mg/dL (ref 65–99)
GLUCOSE-CAPILLARY: 85 mg/dL (ref 65–99)
Glucose-Capillary: 108 mg/dL — ABNORMAL HIGH (ref 65–99)
Glucose-Capillary: 112 mg/dL — ABNORMAL HIGH (ref 65–99)

## 2016-03-30 LAB — PREPARE RBC (CROSSMATCH)

## 2016-03-30 MED ORDER — PANTOPRAZOLE SODIUM 40 MG PO TBEC
40.0000 mg | DELAYED_RELEASE_TABLET | Freq: Every day | ORAL | Status: DC
  Administered 2016-03-30 – 2016-04-06 (×8): 40 mg via ORAL
  Filled 2016-03-30 (×8): qty 1

## 2016-03-30 MED ORDER — SODIUM CHLORIDE 0.9 % IV SOLN
Freq: Once | INTRAVENOUS | Status: AC
  Administered 2016-03-30: 08:00:00 via INTRAVENOUS

## 2016-03-30 NOTE — Progress Notes (Signed)
Dilaudid 1mg  wasted in sharps container.

## 2016-03-30 NOTE — Progress Notes (Signed)
5 Days Post-Op  Subjective: Resting comfortably No nausea - tolerating clears No BM yet. Appreciate Renal consult - 1 u PRBC transfused yesterday with no change in Hgb - Creatinine slightly improved  Objective: Vital signs in last 24 hours: Temp:  [98.9 F (37.2 C)-100 F (37.8 C)] 99.9 F (37.7 C) (03/18 0401) Pulse Rate:  [88-92] 88 (03/18 0401) Resp:  [18-20] 19 (03/18 0401) BP: (137-163)/(68-84) 138/68 (03/18 0401) SpO2:  [93 %-99 %] 99 % (03/18 0401) Weight:  [119.3 kg (263 lb 1.7 oz)] 119.3 kg (263 lb 1.7 oz) (03/18 0401) Last BM Date: 03/24/16  Intake/Output from previous day: 03/17 0701 - 03/18 0700 In: 1267 [P.O.:600; I.V.:400; Blood:267] Out: 900 [Urine:900] Intake/Output this shift: No intake/output data recorded.  General appearance: alert, cooperative and no distress Resp: clear to auscultation bilaterally Cardio: regular rate and rhythm, S1, S2 normal, no murmur, click, rub or gallop GI: soft; minimally distended; + BS; g-tube clamped; Incision c/d/I; some greenish drainage around G-tube site Skin cleaned - dressing changed  Lab Results:   Recent Labs  03/29/16 0503 03/30/16 0408  WBC 11.1* 9.7  HGB 7.1* 7.1*  HCT 21.9* 22.0*  PLT 156 167   BMET  Recent Labs  03/29/16 0503 03/30/16 0408  NA 139 139  K 4.2 3.8  CL 111 109  CO2 24 23  GLUCOSE 118* 103*  BUN 36* 31*  CREATININE 2.10* 1.92*  CALCIUM 9.2 9.1   PT/INR No results for input(s): LABPROT, INR in the last 72 hours. ABG No results for input(s): PHART, HCO3 in the last 72 hours.  Invalid input(s): PCO2, PO2  Studies/Results: No results found.  Anti-infectives: Anti-infectives    Start     Dose/Rate Route Frequency Ordered Stop   03/25/16 1800  ceFAZolin (ANCEF) IVPB 2g/100 mL premix     2 g 200 mL/hr over 30 Minutes Intravenous Every 8 hours 03/25/16 1450 03/25/16 1824   03/25/16 0830  ceFAZolin (ANCEF) 3 g in dextrose 5 % 50 mL IVPB     3 g 130 mL/hr over 30 Minutes  Intravenous To ShortStay Surgical 03/24/16 1205 03/25/16 1012      Assessment/Plan:  POD 5 distal gastrectomy with Billroth 2 gastrojejunostomy for antral cancer, s/p neoadjuvant chemotherapy. Gastrojejunostomy tube - j-tube portion clogged and non-functional.Will have to wait until 2 weeks to change out tube.  Clamp G tube port 03/29/16 and starting clears.  Some leak around G-tube site - will observe Will D/C PCA and start PRN PO pain meds with IV back-up Renal consult to evaluate increasing creatinine and baseline chronic kidney disease Will transfuse 2 u PRBC today to try to push hgb above 10. Encourage ambulation   LOS: 5 days     Aviah Sorci K. 03/30/2016

## 2016-03-30 NOTE — Progress Notes (Signed)
Patient ID: Jacob [name], male   DOB: 16-Feb-1945, 71 y.o.   MRN: [id number]  [facility] Progress Note   Assessment/ Plan:   1.Acute kidney injury on chronic kidney disease stage III: Suspected to be hemodynamically mediated following surgery/increased insensible losses and diminished oral intake. Continue alternate day furosemide and discontinue intravenous fluids based on physical exam findings. 2. Status post diagnostic laparoscopy and distal gastrectomy for gastric adenocarcinoma: Management per surgery. 3. Anemia: Secondary to gastric adenocarcinoma and possibly chronic kidney disease. Underwent 1 unit PRBC transfusion yesterday with no change in hemoglobin-2 more units PRBCs ordered by surgery today 4. Hypertension: Blood pressure is well controlled at this time, continue to follow  Subjective:   Reports to be feeling fair and concerned about his low hemoglobin    Objective:   BP 136/70   Pulse 73   Temp 98.7 F (37.1 C) (Oral)   Resp 18   Ht 6\' 2"  (1.88 m)   Wt 119.3 kg (263 lb 1.7 oz)   SpO2 95%   BMI 33.78 kg/m   Intake/Output Summary (Last 24 hours) at 03/30/16 1048 Last data filed at 03/30/16 0700  Gross per 24 hour  Intake              667 ml  Output              500 ml  Net              167 ml   Weight change: -0.355 kg (-12.5 oz)  Physical Exam: [id number] resting in bed, watching television CVS: Pulse regular rhythm, normal rate, 3/6 ejection systolic murmur over apex Resp: Diminished breath sounds over bases-poor inspiratory effort Abd: Soft, slightly distended, G-tube drainage noted Ext: Trace ankle edema  Imaging: No results found.  Labs: BMET  Recent Labs Lab 03/25/16 1552 03/26/16 0808 03/27/16 0417 03/28/16 0528 03/29/16 0503 03/30/16 0408  NA  --  138 140 140 139 139  K  --  5.6* 5.0 4.7 4.2 3.8  CL  --  111 112* 112* 111 109  CO2  --  22 23 23 24 23   GLUCOSE  --  99 90 98 118* 103*  BUN  --  28* 29* 33* 36*  31*  CREATININE 1.77* 1.94* 1.88* 2.00* 2.10* 1.92*  CALCIUM  --  8.7* 9.1 9.0 9.2 9.1   CBC  Recent Labs Lab 03/27/16 0417 03/28/16 0528 03/29/16 0503 03/30/16 0408  WBC 9.9 13.1* 11.1* 9.7  HGB 7.6* 7.5* 7.1* 7.1*  HCT 24.3* 23.7* 21.9* 22.0*  MCV 102.1* 100.4* 100.5* 99.1  PLT 151 159 156 167    Medications:    . sodium chloride   Intravenous Once  . allopurinol  300 mg Oral Daily  . buPROPion  300 mg Oral Daily  . furosemide  40 mg Oral QODAY  . gabapentin  800 mg Oral QHS  . heparin  5,000 Units Subcutaneous Q8H  . mouth rinse  15 mL Mouth Rinse BID  . nebivolol  20 mg Oral Daily  . pantoprazole (PROTONIX) IV  40 mg Intravenous QHS  . tamsulosin  0.4 mg Oral Daily  . traZODone  50 mg Oral QHS  . verapamil  300 mg Oral QHS      Elmarie Shiley, MD 03/30/2016, 10:48 AM

## 2016-03-30 NOTE — Progress Notes (Signed)
Was called into room from NT stating she needed help. Upon entering, I saw patient standing up with a steady stream of liquids coming from abdomen at the G/J tube insertion site onto floor. There was a pool of fluid on the floor and this happened right after he consumed his clear liquid breakfast tray. It seemed like what was coming out was exactly what he consumed. The color was greenish/brown, I changed his gauze around the G/J tube and there looked like small particles of pills coming out along with clear greenish fluid. I notified MD of what happened, he said okay make him NPO and hook the foley cath bag back up to the tube. Notified patient to now not eat/drink anything, he understood.

## 2016-03-31 LAB — BPAM RBC
BLOOD PRODUCT EXPIRATION DATE: 201804062359
BLOOD PRODUCT EXPIRATION DATE: 201804092359
Blood Product Expiration Date: 201804072359
ISSUE DATE / TIME: 201803180941
ISSUE DATE / TIME: 201803171557
ISSUE DATE / TIME: 201803181311
UNIT TYPE AND RH: 5100
Unit Type and Rh: 5100
Unit Type and Rh: 5100

## 2016-03-31 LAB — TYPE AND SCREEN
ABO/RH(D): O POS
Antibody Screen: NEGATIVE
UNIT DIVISION: 0
UNIT DIVISION: 0
Unit division: 0

## 2016-03-31 LAB — GLUCOSE, CAPILLARY
GLUCOSE-CAPILLARY: 82 mg/dL (ref 65–99)
GLUCOSE-CAPILLARY: 90 mg/dL (ref 65–99)
GLUCOSE-CAPILLARY: 89 mg/dL (ref 65–99)
Glucose-Capillary: 93 mg/dL (ref 65–99)

## 2016-03-31 NOTE — Progress Notes (Addendum)
6 Days Post-Op  Subjective: Pt had clears yesterday and did not have nausea/vomiting, but he did have significant leakage of stomach contents around G tube.   Objective: Vital signs in last 24 hours: Temp:  [98.8 F (37.1 C)-99.4 F (37.4 C)] 98.8 F (37.1 C) (03/19 1558) Pulse Rate:  [73-88] 75 (03/19 1558) Resp:  [18-19] 18 (03/19 0548) BP: (133-172)/(77-91) 172/91 (03/19 1558) SpO2:  [97 %-98 %] 97 % (03/19 1558) Last BM Date: 03/24/16  Intake/Output from previous day: 03/18 0701 - 03/19 0700 In: 712 [Blood:662] Out: 2050 [Urine:900; Drains:1150] Intake/Output this shift: Total I/O In: 330 [P.O.:220; IV Piggyback:110] Out: 300 [Urine:300]  General appearance: alert, cooperative and no distress Resp: breathing comfortably GI: soft; non distended.  Bilious output around feeding tube.  Pt with tenderness when balloon attempted to be inflated.    Lab Results:   Recent Labs  03/29/16 0503 03/30/16 0408 03/30/16 2120  WBC 11.1* 9.7  --   HGB 7.1* 7.1* 8.3*  HCT 21.9* 22.0* 25.6*  PLT 156 167  --    BMET  Recent Labs  03/29/16 0503 03/30/16 0408  NA 139 139  K 4.2 3.8  CL 111 109  CO2 24 23  GLUCOSE 118* 103*  BUN 36* 31*  CREATININE 2.10* 1.92*  CALCIUM 9.2 9.1   PT/INR No results for input(s): LABPROT, INR in the last 72 hours. ABG No results for input(s): PHART, HCO3 in the last 72 hours.  Invalid input(s): PCO2, PO2  Studies/Results: No results found.  Anti-infectives: Anti-infectives    Start     Dose/Rate Route Frequency Ordered Stop   03/25/16 1800  ceFAZolin (ANCEF) IVPB 2g/100 mL premix     2 g 200 mL/hr over 30 Minutes Intravenous Every 8 hours 03/25/16 1450 03/25/16 1824   03/25/16 0830  ceFAZolin (ANCEF) 3 g in dextrose 5 % 50 mL IVPB     3 g 130 mL/hr over 30 Minutes Intravenous To ShortStay Surgical 03/24/16 1205 03/25/16 1012      Assessment/Plan:  POD 6 distal gastrectomy with Billroth 2 gastrojejunostomy for antral cancer,  s/p neoadjuvant chemotherapy. Gastrojejunostomy tube - j-tube portion clogged and non-functional.Will have to wait until 2 weeks to change out tube.  Will try full liquids with tube clamped and then open to gravity after around 30 minutes.   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  Complicated by acute blood loss anemia.   Discussed with IR possibility for manipulation of tube to get either current tube in farther in order to blow up retention balloon more, or change out of tube. They will evaluate and target manipulation later in week.     LOS: 6 days     [facility] 03/31/2016

## 2016-03-31 NOTE — Progress Notes (Addendum)
Nutrition Follow-up  DOCUMENTATION CODES:   Obesity unspecified  INTERVENTION:   -RD will follow for resumption of TF; recommend:   Initiate trickle feeds of Jevity 1.5 @ 10 ml/hr via j-port  And increase (per MD discretion) by 10 ml every 12 hours to goal rate of 50 ml/hr.   60 ml Prostat BID.    Tube feeding regimen provides 2200 kcal (100% of needs), 137 grams of protein, and 912 ml of H2O.   -Mighty Shake II TID with meals  NUTRITION DIAGNOSIS:   Inadequate oral intake related to altered GI function as evidenced by  (G/J tube dependent).  Ongoing  GOAL:   Patient will meet greater than or equal to 90% of their needs  Progressing  MONITOR:   PO intake, Supplement acceptance, Labs, Weight trends, Skin, I & O's, Diet advancement, TF tolerance  REASON FOR ASSESSMENT:   Consult Enteral/tube feeding initiation and management  ASSESSMENT:   [name] referred by Dr. Alen Blew with a diagnosis of gastric cancer found October 2017. He was been being followed for prostate cancer diagnosed in 2013. He received radiation treatment as definitive therapy for that. He was seen to be anemic and early 2017 and had a bone marrow biopsy. 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   S/p Procedure(s) on 03/25/16: Diagnostic laparoscopy, distal gastrectomy with Billroth 2 reconstruction and gastrojejunostomy tube  Case discussed with RN; j-tube became clogged on 03/27/16 and was unable to successfully unclog. Pt was started on clear liquids on 03/30/16 and RN reported fluid (that appeared to be look food contents) started leaking around incision and j-tube site after consuming lunch. Pt with g-tube to gravity. Orders to clamp g-tube 30 minutes after meals and then hook back to suction. Per RN, unable to replace j-tube until 2 weeks post-op.   Labs reviewed: CBGS: 85-95.   Diet Order:  DIET SOFT Room service appropriate? Yes; Fluid consistency: Thin  Skin:    (closed abdominal incision)  Last BM:  04/01/16  Height:   Ht Readings from Last 1 Encounters:  03/25/16 6' 2"  (1.88 m)    Weight:   Wt Readings from Last 1 Encounters:  03/30/16 263 lb 1.7 oz (119.3 kg)    Ideal Body Weight:  86.4 kg  BMI:  Body mass index is 33.78 kg/m.  Estimated Nutritional Needs:   Kcal:  2000-2200  Protein:  125-150 grams  Fluid:  1.9-2.1 L  EDUCATION NEEDS:   No education needs identified at this time  Cashton Hosley A. Jimmye Norman, RD, LDN, CDE Pager: [phone] After hours Pager: [phone]

## 2016-03-31 NOTE — Care Management Important Message (Signed)
Important Message  Patient Details  Name: Jacob [name] MRN: [id number] Date of Birth: 1945-08-07   Medicare Important Message Given:  Yes    Thi Klich Montine Circle 03/31/2016, 12:34 PM

## 2016-03-31 NOTE — Progress Notes (Signed)
Patient OOB ambulatory 1 unit lap this morning; patient tolerated well. G-J tube dressing changed twice this shift due to soiling; 434ml shift total output noted.

## 2016-03-31 NOTE — Progress Notes (Signed)
Patient ID: Jacob [name], male   DOB: 04-Nov-1945, 71 y.o.   MRN: [id number]  [facility] Progress Note   Assessment/ Plan:   1.Acute kidney injury on chronic kidney disease stage III: Suspected to be hemodynamically mediated following surgery/increased insensible losses and diminished oral intake. Continue alternate day furosemide and discontinue intravenous fluids based on physical exam findings. Creatinine improved from yesterday  2. Status post diagnostic laparoscopy and distal gastrectomy for gastric adenocarcinoma: POD #6  Management per surgery. He thinks he will need to stay inpatient for another week  3. Anemia: Secondary to gastric adenocarcinoma and possibly chronic kidney disease. Underwent 1 unit PRBC transfusion 3/17 with no change in hemoglobin-2 more units PRBCs ordered by surgery yesterday- hgb slightly better 4. Hypertension: Blood pressure is well controlled at this time, continue to follow- bystolic and verapamil 5. Need to be careful with pretty high doses of neurontin/robaxin/oxy/ativan  Subjective:   Reports to be feeling fair - good UOP , creatinine down to close to baseline   Objective:   BP 138/77 (BP Location: Left Arm)   Pulse 73   Temp 99.4 F (37.4 C) (Oral)   Resp 18   Ht 6\' 2"  (1.88 m)   Wt 119.3 kg (263 lb 1.7 oz)   SpO2 97%   BMI 33.78 kg/m   Intake/Output Summary (Last 24 hours) at 03/31/16 1138 Last data filed at 03/31/16 1022  Gross per 24 hour  Intake             1042 ml  Output             2050 ml  Net            -1008 ml   Weight change:   Physical Exam: [id number] resting in bed, watching television CVS: Pulse regular rhythm, normal rate, 3/6 ejection systolic murmur over apex Resp: Diminished breath sounds over bases-poor inspiratory effort Abd: Soft, slightly distended, G-tube drainage noted Ext: no edema  Imaging: No results found.  Labs: BMET  Recent Labs Lab 03/25/16 1552 03/26/16 0808  03/27/16 0417 03/28/16 0528 03/29/16 0503 03/30/16 0408  NA  --  138 140 140 139 139  K  --  5.6* 5.0 4.7 4.2 3.8  CL  --  111 112* 112* 111 109  CO2  --  22 23 23 24 23   GLUCOSE  --  99 90 98 118* 103*  BUN  --  28* 29* 33* 36* 31*  CREATININE 1.77* 1.94* 1.88* 2.00* 2.10* 1.92*  CALCIUM  --  8.7* 9.1 9.0 9.2 9.1   CBC  Recent Labs Lab 03/27/16 0417 03/28/16 0528 03/29/16 0503 03/30/16 0408 03/30/16 2120  WBC 9.9 13.1* 11.1* 9.7  --   HGB 7.6* 7.5* 7.1* 7.1* 8.3*  HCT 24.3* 23.7* 21.9* 22.0* 25.6*  MCV 102.1* 100.4* 100.5* 99.1  --   PLT 151 159 156 167  --     Medications:    . allopurinol  300 mg Oral Daily  . buPROPion  300 mg Oral Daily  . furosemide  40 mg Oral QODAY  . gabapentin  800 mg Oral QHS  . heparin  5,000 Units Subcutaneous Q8H  . mouth rinse  15 mL Mouth Rinse BID  . nebivolol  20 mg Oral Daily  . pantoprazole  40 mg Oral QHS  . tamsulosin  0.4 mg Oral Daily  . traZODone  50 mg Oral QHS  . verapamil  300 mg Oral QHS  Jacob [name] A  03/31/2016, 11:38 AM

## 2016-04-01 ENCOUNTER — Inpatient Hospital Stay (HOSPITAL_COMMUNITY): Payer: Medicare Other

## 2016-04-01 ENCOUNTER — Encounter (HOSPITAL_COMMUNITY): Payer: Self-pay | Admitting: General Surgery

## 2016-04-01 HISTORY — PX: IR GENERIC HISTORICAL: IMG1180011

## 2016-04-01 LAB — RENAL FUNCTION PANEL
ALBUMIN: 2.2 g/dL — AB (ref 3.5–5.0)
Anion gap: 9 (ref 5–15)
BUN: 24 mg/dL — AB (ref 6–20)
CO2: 22 mmol/L (ref 22–32)
CREATININE: 1.69 mg/dL — AB (ref 0.61–1.24)
Calcium: 9.6 mg/dL (ref 8.9–10.3)
Chloride: 108 mmol/L (ref 101–111)
GFR calc Af Amer: 46 mL/min — ABNORMAL LOW (ref >60)
GFR, EST NON AFRICAN AMERICAN: 39 mL/min — AB (ref >60)
GLUCOSE: 81 mg/dL (ref 65–99)
PHOSPHORUS: 3 mg/dL (ref 2.5–4.6)
Potassium: 3.8 mmol/L (ref 3.5–5.1)
SODIUM: 139 mmol/L (ref 135–145)

## 2016-04-01 LAB — PROTIME-INR
INR: 1.11
PROTHROMBIN TIME: 14.3 s (ref 11.4–15.2)

## 2016-04-01 LAB — GLUCOSE, CAPILLARY
GLUCOSE-CAPILLARY: 97 mg/dL (ref 65–99)
Glucose-Capillary: 102 mg/dL — ABNORMAL HIGH (ref 65–99)
Glucose-Capillary: 108 mg/dL — ABNORMAL HIGH (ref 65–99)
Glucose-Capillary: 82 mg/dL (ref 65–99)
Glucose-Capillary: 82 mg/dL (ref 65–99)

## 2016-04-01 IMAGING — XA IR REPLACE G/J TUBE W/ FLUORO
6 series · 6 of 6 positions shown · non-contrast
Comparison: none

INDICATION: History of distal gastrectomy with Billroth 2 reconstitution.
Request for a tube exchange.

[Series 1: fl (-) angio · 1 of 1 slices shown (1 of 4)]
[im 1/1]
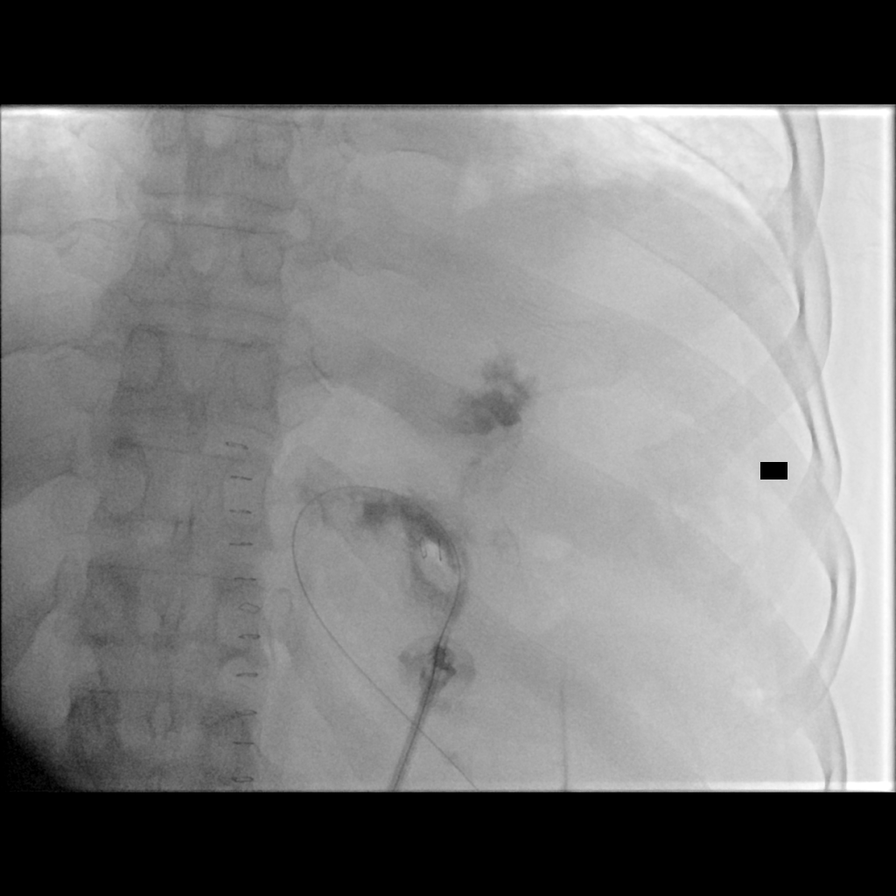

[Series 2: fl (-) angio · 1 of 1 slices shown (2 of 4)]
[im 1/1]
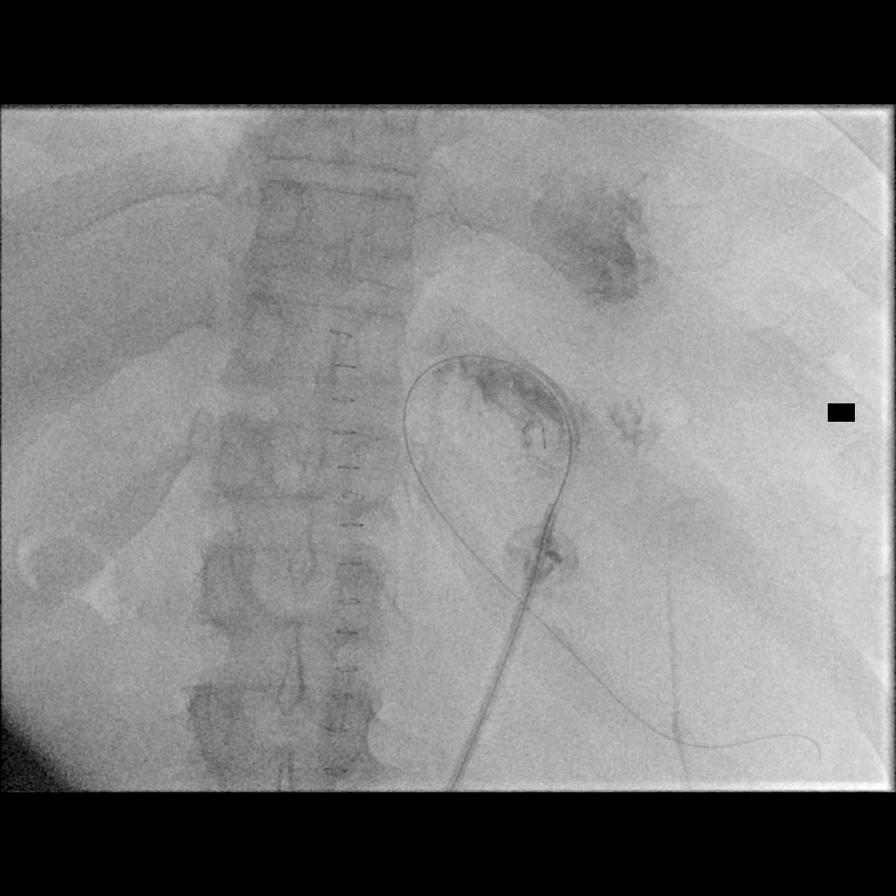

[Series 3: fl (-) angio · 1 of 1 slices shown (3 of 4)]
[im 1/1]
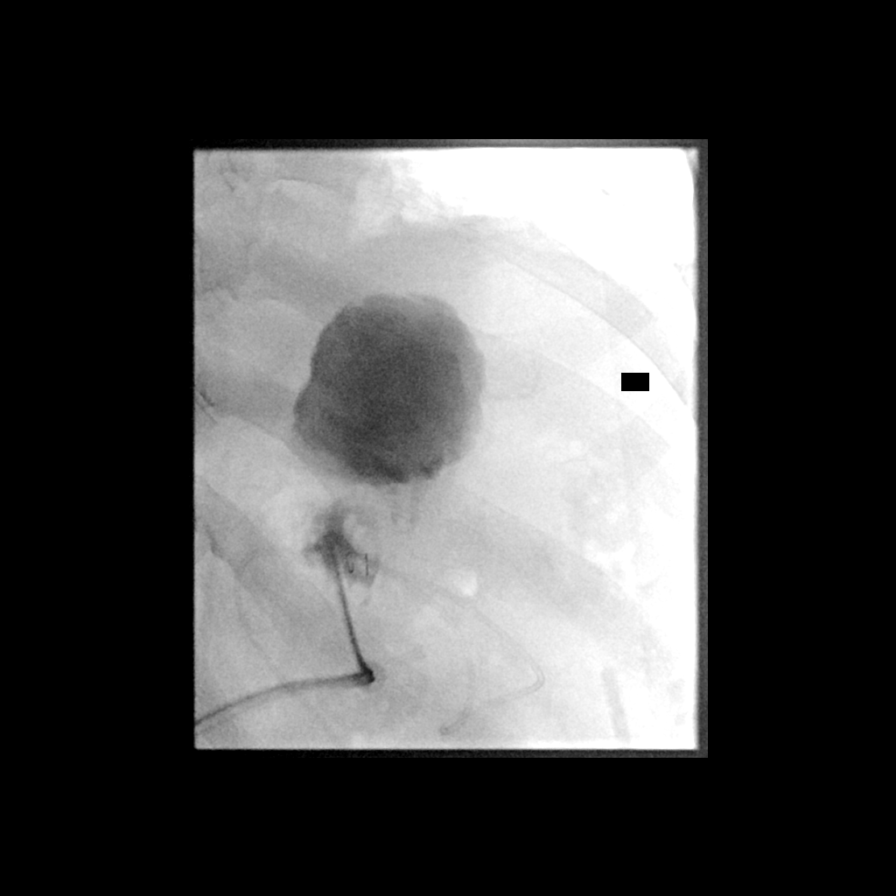

[Series 4: single · 1 of 1 slices shown (1 of 2)]
[im 1/1]
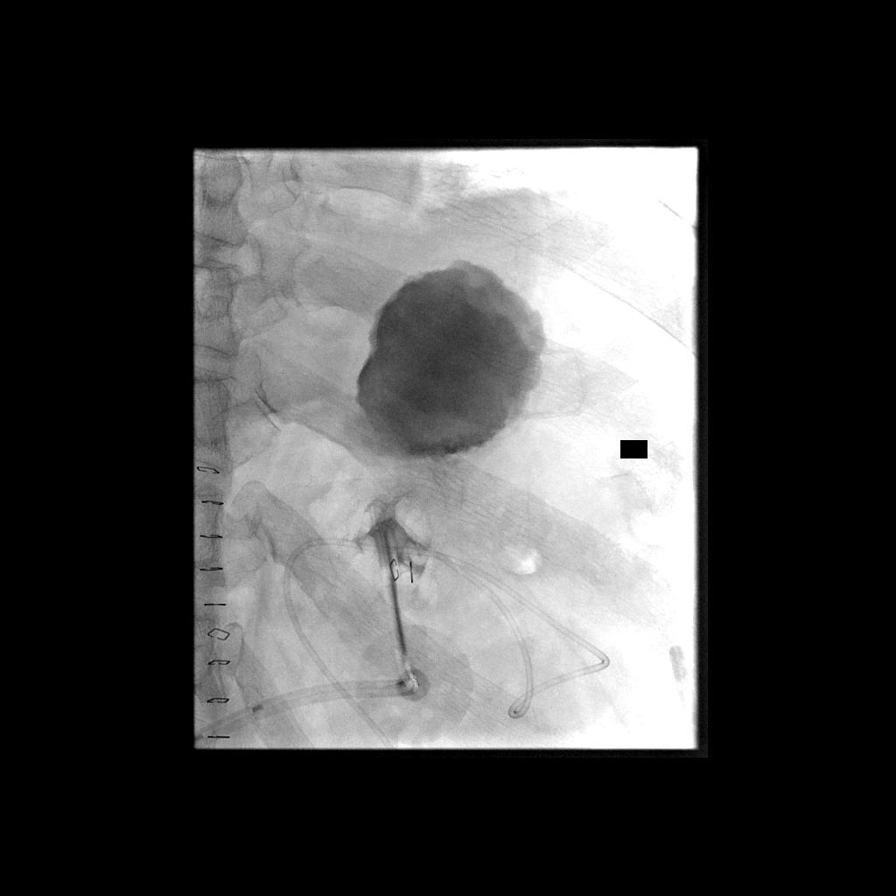

[Series 5: fl (-) angio · 1 of 1 slices shown (4 of 4)]
[im 1/1]
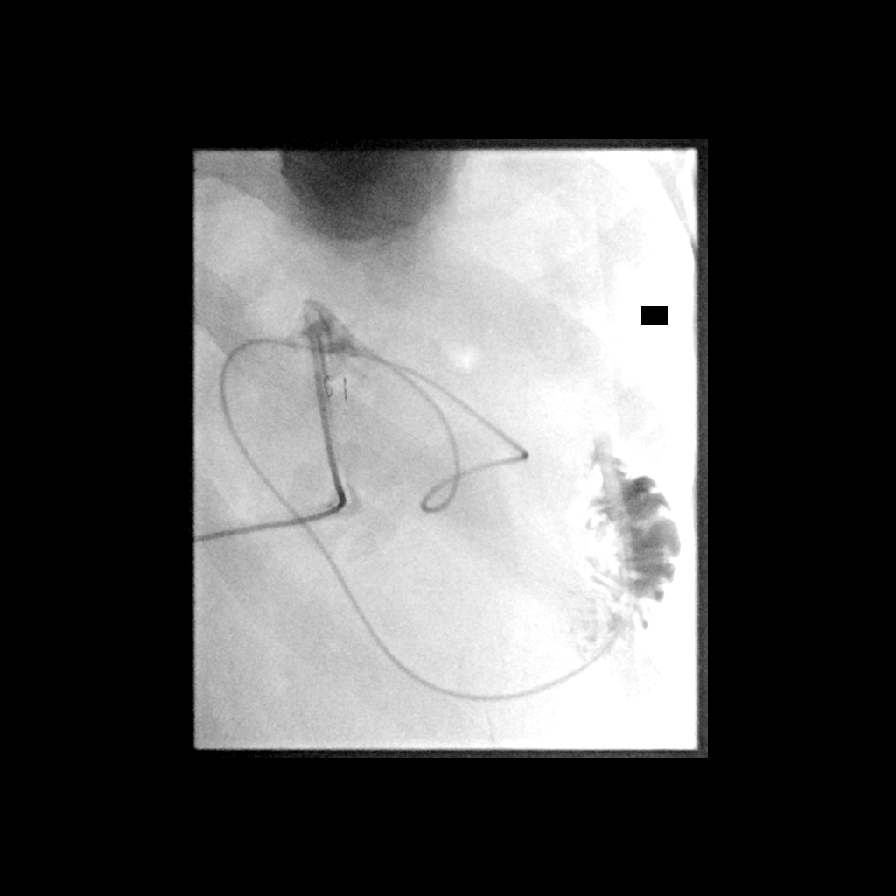

[Series 6: single · 1 of 1 slices shown (2 of 2)]
[im 1/1]
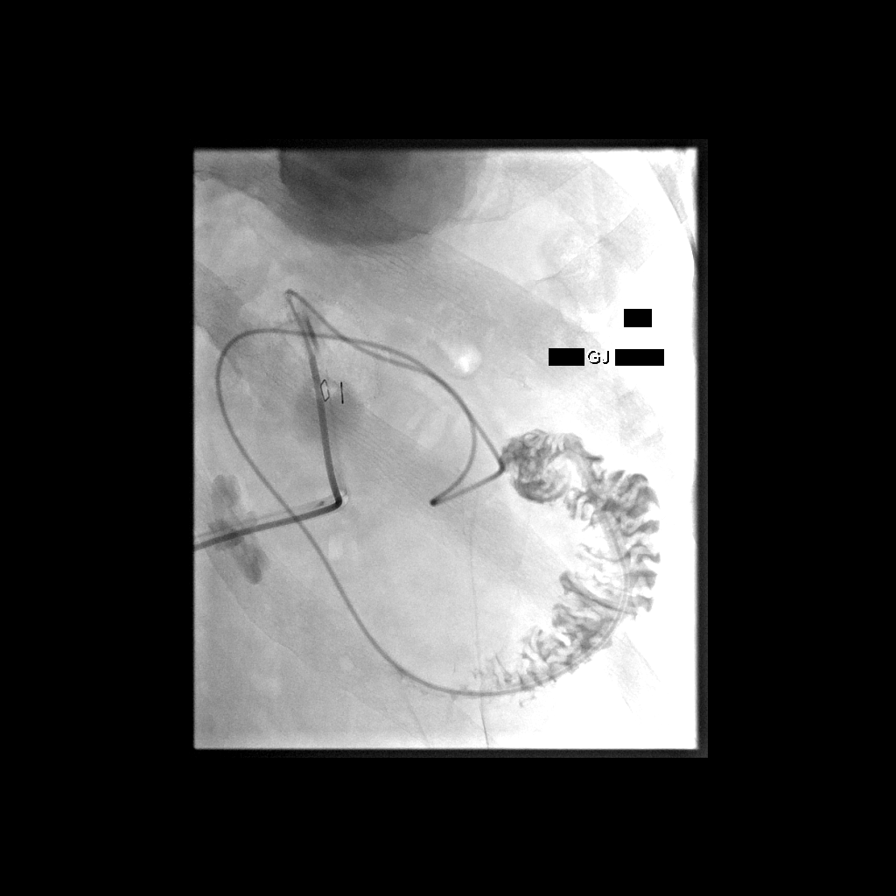

[6 of 6 positions shown; findings below may reference images not displayed]

EXAM:
GASTROJEJUNAL CATHETER REPLACEMENT WITH FLUOROSCOPY

MEDICATIONS:
None

ANESTHESIA/SEDATION:
None

CONTRAST:  25 mL [WK]

FLUOROSCOPY TIME:  Fluoroscopy Time: 2 minutes and 24 seconds, 21
mGy

COMPLICATIONS:
None immediate.

PROCEDURE:
Informed written consent was obtained from the patient after a
thorough discussion of the procedural risks, benefits and
alternatives. All questions were addressed. Maximal Sterile Barrier
Technique was utilized including caps, mask, sterile gowns, sterile
gloves, sterile drape, hand hygiene and skin antiseptic. A timeout
was performed prior to the initiation of the procedure.

The GJ tube catheter was prepped and draped in a sterile fashion.
Glidewire was able to be advanced through the existing jejunal
catheter. The skin retention sutures were removed. The existing tube
was removed over the wire. A new 18 French GJ feeding tube was
advanced over the wire. The distal aspect of the tube advanced into
the small bowel. Proximal portion of the jejunal catheter was coiled
in the stomach. The retention balloon was inflated with 8 mL of
dilute contrast in the stomach. Contrast was injected through the
gastric and jejunal lumens to confirm appropriate placement. Both
lumens were then flushed with saline.
FINDINGS: The new jejunal tube is in the small bowel.
IMPRESSION: Successful exchange of the GJ feeding tube.  Tube is ready for use.

## 2016-04-01 MED ORDER — HEPARIN SODIUM (PORCINE) 5000 UNIT/ML IJ SOLN
5000.0000 [IU] | Freq: Three times a day (TID) | INTRAMUSCULAR | Status: DC
  Administered 2016-04-02 – 2016-04-07 (×15): 5000 [IU] via SUBCUTANEOUS
  Filled 2016-04-01 (×12): qty 1

## 2016-04-01 MED ORDER — IOPAMIDOL (ISOVUE-300) INJECTION 61%
INTRAVENOUS | Status: AC
  Administered 2016-04-01: 25 mL
  Filled 2016-04-01: qty 50

## 2016-04-01 NOTE — Consult Note (Signed)
Chief Complaint: GJ tube dysfunction  Referring Physician:Dr. [name]  Supervising Physician: Markus Daft  Patient Status: [facility] - In-pt  HPI: Jacob [name] is an 71 y.o. male who was diagnosed with antral cancer and underwent a distal gastrectomy with Billroth 2 gastrojejunostomy 7 days ago.  Apparently on POD 1, the j-tube portion became clogged. The RN tried to unclog it and at that time blew out the side wall of the j-tube.  Unfortunately, it has been leaking gastric contents since that time.  Different things have been done to try and salvage the tube, but with no avail.  We have been asked to the patient to consider a GJ tube exchange.  Past Medical History:  Past Medical History:  Diagnosis Date  . Anemia    hx iron deficiency  . Anxiety   . Arthritis    gout  . Back pain   . BPH with obstruction/lower urinary tract symptoms   . Chronic kidney disease    "they said I do"  . Constipation   . Depression   . ED (erectile dysfunction)   . Epigastric pain   . GERD (gastroesophageal reflux disease)   . Heartburn   . History of blood transfusion   . History of radiation therapy 11/03/11-12/29/11   prostate  . Hypercholesterolemia   . Hypertension   . Night sweats   . 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 PUD (peptic ulcer disease)   . Sleep apnea    does not use Cpap but does use O2 @ 2l   . Stomach cancer ([place])   . Ulcer ([place])    peptic ulcer hx    Past Surgical History:  Past Surgical History:  Procedure Laterality Date  . colon polyps bx  11/24/06   colon,transverse and rectosigmoid polyps:tubular adenomas and hyperplastic polyps,no high grade dysplasia or malignancy   . duodenal bx  11/24/06   benign  . ESOPHAGOGASTRODUODENOSCOPY N/A 10/27/2015   Procedure: ESOPHAGOGASTRODUODENOSCOPY (EGD);  Surgeon: [name], MD;  Location: [facility];  Service: Endoscopy;  Laterality: N/A;  . EUS N/A 11/08/2015     Procedure: UPPER ENDOSCOPIC ULTRASOUND (EUS) RADIAL;  Surgeon: [name], MD;  Location: [facility];  Service: Endoscopy;  Laterality: N/A;  . GASTRECTOMY N/A 03/25/2016   Procedure: DISTAL GASTRECTOMY;  Surgeon: [name], MD;  Location: [place];  Service: General;  Laterality: N/A;  . gastric bx  11/24/06   chronic active gastritis,with metaplasia and focal changaes of xanthelasma  . GASTROSTOMY N/A 03/25/2016   Procedure: INSERTION OF FEEDING TUBE;  Surgeon: [name], MD;  Location: [place];  Service: General;  Laterality: N/A;  . INSERTION PROSTATE RADIATION SEED  12-29-11  . LAPAROSCOPIC GASTRECTOMY  03/25/2016   Diagnostic laparoscopy, distal gastrectomy with Billroth 2 reconstruction and gastrojejunostomy tube  . LAPAROSCOPY N/A 03/25/2016   Procedure: DIAGNOSTIC LAPAROSCOPY;  Surgeon: [name], MD;  Location: [place];  Service: General;  Laterality: N/A;  . PORTACATH PLACEMENT Left 12/04/2015   Procedure: INSERTION PORT-A-CATH;  Surgeon: [name], MD;  Location: [place];  Service: General;  Laterality: Left;  . PROSTATE BIOPSY  08/14/11   Adenocarcinoma/volume=58.51cc,gleason=3+3=6 & 3+4=7  . TONSILLECTOMY     71 years old    Family History:  Family History  Problem Relation Age of Onset  . Cancer Mother     NOS  . Alcohol abuse Father   . Alcohol abuse Brother 50  . Cancer Paternal Aunt  NOS  . Colon cancer Neg Hx     Social History:  reports that he quit smoking about 15 years ago. His smoking use included Cigarettes. He has a 45.00 pack-year smoking history. He has never used smokeless tobacco. He reports that he drinks about 0.6 oz of alcohol per week . He reports that he does not use drugs.  Allergies:  Allergies  Allergen Reactions  . No Known Allergies     Medications: Medications reviewed in epic  Please HPI for pertinent positives, otherwise complete 10 system ROS negative.  Mallampati Score: MD Evaluation Airway: WNL Heart:  WNL Abdomen: WNL Chest/ Lungs: WNL ASA  Classification: 3 Mallampati/Airway Score: One  Physical Exam: BP 133/72 (BP Location: Left Arm)   Pulse 74   Temp 99.5 F (37.5 C) (Oral)   Resp 18   Ht 6' 2"  (1.88 m)   Wt 263 lb 1.7 oz (119.3 kg)   SpO2 95%   BMI 33.78 kg/m  Body mass index is 33.78 kg/m. General: pleasant, WD, WN black male who is laying in bed in NAD HEENT: head is normocephalic, atraumatic.  Sclera are noninjected.  PERRL.  Ears and nose without any masses or lesions.  Mouth is pink and moist Heart: regular, rate, and rhythm.  Normal s1,s2. No obvious murmurs, gallops, or rubs noted.  Palpable radial pulses bilaterally Lungs: CTAB, no wheezes, rhonchi, or rales noted.  Respiratory effort nonlabored Abd: soft, appropriately tender,  Midline incision with staples intact, ND, +BS, no masses, hernias, or organomegaly.  GJ tube in place with copious dressings in place to help with gastric leakage. Psych: A&Ox3 with an appropriate affect.   Labs: Results for orders placed or performed during the hospital encounter of 03/25/16 (from the past 48 hour(s))  Glucose, capillary     Status: None   Collection Time: 03/30/16 12:21 PM  Result Value Ref Range   Glucose-Capillary 95 65 - 99 mg/dL  Glucose, capillary     Status: None   Collection Time: 03/30/16  4:04 PM  Result Value Ref Range   Glucose-Capillary 82 65 - 99 mg/dL  Glucose, capillary     Status: None   Collection Time: 03/30/16  7:52 PM  Result Value Ref Range   Glucose-Capillary 85 65 - 99 mg/dL  Hemoglobin and hematocrit, blood     Status: Abnormal   Collection Time: 03/30/16  9:20 PM  Result Value Ref Range   Hemoglobin 8.3 (L) 13.0 - 17.0 g/dL   HCT 25.6 (L) 39.0 - 52.0 %  Glucose, capillary     Status: None   Collection Time: 03/31/16  8:02 AM  Result Value Ref Range   Glucose-Capillary 82 65 - 99 mg/dL  Glucose, capillary     Status: None   Collection Time: 03/31/16 12:32 PM  Result Value Ref Range    Glucose-Capillary 90 65 - 99 mg/dL   Comment 1 Notify RN   Glucose, capillary     Status: None   Collection Time: 03/31/16  4:36 PM  Result Value Ref Range   Glucose-Capillary 89 65 - 99 mg/dL   Comment 1 Notify RN   Glucose, capillary     Status: None   Collection Time: 03/31/16 10:09 PM  Result Value Ref Range   Glucose-Capillary 93 65 - 99 mg/dL  Glucose, capillary     Status: Abnormal   Collection Time: 04/01/16 12:07 AM  Result Value Ref Range   Glucose-Capillary 108 (H) 65 - 99 mg/dL  Renal function  panel     Status: Abnormal   Collection Time: 04/01/16  4:07 AM  Result Value Ref Range   Sodium 139 135 - 145 mmol/L   Potassium 3.8 3.5 - 5.1 mmol/L   Chloride 108 101 - 111 mmol/L   CO2 22 22 - 32 mmol/L   Glucose, Bld 81 65 - 99 mg/dL   BUN 24 (H) 6 - 20 mg/dL   Creatinine, Ser 1.69 (H) 0.61 - 1.24 mg/dL   Calcium 9.6 8.9 - 10.3 mg/dL   Phosphorus 3.0 2.5 - 4.6 mg/dL   Albumin 2.2 (L) 3.5 - 5.0 g/dL   GFR calc non Af Amer 39 (L) >60 mL/min   GFR calc Af Amer 46 (L) >60 mL/min    Comment: (NOTE) The eGFR has been calculated using the CKD EPI equation. This calculation has not been validated in all clinical situations. eGFR's persistently <60 mL/min signify possible Chronic Kidney Disease.    Anion gap 9 5 - 15  Glucose, capillary     Status: None   Collection Time: 04/01/16  8:08 AM  Result Value Ref Range   Glucose-Capillary 82 65 - 99 mg/dL   Comment 1 Notify RN     Imaging: No results found.  Assessment/Plan 1. GJ tube malfunction -The patient has a Moss GJ tube, 53F in place. -I have made the patient NPO and held his blood thinners.  If we can proceed with this exchange today we will.  If timing does not allow, given his late NPO, then we will plan to do this tomorrow. -I have spoken with Dr. Barry Dienes.  Ideally, if able, she would like a new GJ tube.  If we are unable to thread the J-tube at the time, then just a g-tube is ok for now.  I have relayed this to  Dr. Anselm Pancoast. -labs and vitals reviewed. -Risks and Benefits discussed with the patient including, but not limited to bleeding, infection, peritonitis, or damage to adjacent structures. All of the patient's questions were answered, patient is agreeable to proceed. Consent signed and in chart.  Thank you for this interesting consult.  I greatly enjoyed meeting Harrison Hnat and look forward to participating in their care.  A copy of this report was sent to the requesting provider on this date.  Electronically Signed: Henreitta Cea 04/01/2016, 11:01 AM   I spent a total of 40 Minutes    in face to face in clinical consultation, greater than 50% of which was counseling/coordinating care for GJ tube dysfunction

## 2016-04-01 NOTE — Progress Notes (Signed)
Patient ID: Jacob [name], male   DOB: Oct 21, 1945, 71 y.o.   MRN: [id number]  [facility] Progress Note   Assessment/ Plan:   1.Acute kidney injury on chronic kidney disease stage III: Suspected to be hemodynamically mediated following surgery/increased insensible losses and diminished oral intake. Continue alternate day furosemide and discontinue intravenous fluids based on physical exam findings. Creatinine improved from yesterday again- down to his baseline 2. Status post diagnostic laparoscopy and distal gastrectomy for gastric adenocarcinoma: POD #7  Management per surgery. He thinks he will need to stay inpatient for another week ? 3. Anemia: Secondary to gastric adenocarcinoma and possibly chronic kidney disease. Underwent 1 unit PRBC transfusion 3/17 with no change in hemoglobin-2 more units PRBCs ordered by surgery 3/19- hgb slightly better 4. Hypertension: Blood pressure is well controlled at this time, continue to follow- bystolic and verapamil 5. Need to be careful with pretty high doses of neurontin/robaxin/oxy/ativan  Kidney function back down to baseline- renal will sign off - he will get appropriate renal follow up after hospiitalizaion  Subjective:   Reports to be feeling fair - good UOP , creatinine down to close to baseline   Objective:   BP (!) 144/80 (BP Location: Right Arm)   Pulse 76   Temp 98.7 F (37.1 C) (Oral)   Resp 18   Ht 6\' 2"  (1.88 m)   Wt 119.3 kg (263 lb 1.7 oz)   SpO2 96%   BMI 33.78 kg/m   Intake/Output Summary (Last 24 hours) at 04/01/16 1606 Last data filed at 04/01/16 0920  Gross per 24 hour  Intake              428 ml  Output             3000 ml  Net            -2572 ml   Weight change:   Physical Exam: QMG:[id number] resting in bed, watching television CVS: Pulse regular rhythm, normal rate, 3/6 ejection systolic murmur over apex Resp: Diminished breath sounds over bases-poor inspiratory effort Abd: Soft, slightly  distended, G-tube drainage noted Ext: no edema  Imaging: No results found.  Labs: BMET  Recent Labs Lab 03/26/16 0808 03/27/16 0417 03/28/16 0528 03/29/16 0503 03/30/16 0408 04/01/16 0407  NA 138 140 140 139 139 139  K 5.6* 5.0 4.7 4.2 3.8 3.8  CL 111 112* 112* 111 109 108  CO2 22 23 23 24 23 22   GLUCOSE 99 90 98 118* 103* 81  BUN 28* 29* 33* 36* 31* 24*  CREATININE 1.94* 1.88* 2.00* 2.10* 1.92* 1.69*  CALCIUM 8.7* 9.1 9.0 9.2 9.1 9.6  PHOS  --   --   --   --   --  3.0   CBC  Recent Labs Lab 03/27/16 0417 03/28/16 0528 03/29/16 0503 03/30/16 0408 03/30/16 2120  WBC 9.9 13.1* 11.1* 9.7  --   HGB 7.6* 7.5* 7.1* 7.1* 8.3*  HCT 24.3* 23.7* 21.9* 22.0* 25.6*  MCV 102.1* 100.4* 100.5* 99.1  --   PLT 151 159 156 167  --     Medications:    . allopurinol  300 mg Oral Daily  . buPROPion  300 mg Oral Daily  . furosemide  40 mg Oral QODAY  . gabapentin  800 mg Oral QHS  . [START ON 04/02/2016] heparin  5,000 Units Subcutaneous Q8H  . mouth rinse  15 mL Mouth Rinse BID  . nebivolol  20 mg Oral Daily  . pantoprazole  40 mg Oral QHS  . tamsulosin  0.4 mg Oral Daily  . traZODone  50 mg Oral QHS  . verapamil  300 mg Oral QHS      Jacob [name] A  04/01/2016, 4:06 PM

## 2016-04-01 NOTE — Progress Notes (Signed)
7 Days Post-Op  Subjective: PT doing well today.  States that he tol CLD well  Objective: Vital signs in last 24 hours: Temp:  [98 F (36.7 C)-99.5 F (37.5 C)] 99.5 F (37.5 C) (03/20 0529) Pulse Rate:  [74-82] 74 (03/20 0529) Resp:  [18] 18 (03/20 0529) BP: (133-172)/(72-91) 133/72 (03/20 0529) SpO2:  [95 %-97 %] 95 % (03/20 0529) Last BM Date: 03/24/16  Intake/Output from previous day: 03/19 0701 - 03/20 0700 In: 700 [P.O.:580; I.V.:10; IV Piggyback:110] Out: 3300 [Urine:2000; Drains:1300] Intake/Output this shift: No intake/output data recorded.  General appearance: alert and cooperative Cardio: regular rate and rhythm, S1, S2 normal, no murmur, click, rub or gallop GI: soft, non-tender; bowel sounds normal; no masses,  no organomegaly and Gtube bilious output  Lab Results:   Recent Labs  03/30/16 0408 03/30/16 2120  WBC 9.7  --   HGB 7.1* 8.3*  HCT 22.0* 25.6*  PLT 167  --    BMET  Recent Labs  03/30/16 0408 04/01/16 0407  NA 139 139  K 3.8 3.8  CL 109 108  CO2 23 22  GLUCOSE 103* 81  BUN 31* 24*  CREATININE 1.92* 1.69*  CALCIUM 9.1 9.6   PT/INR No results for input(s): LABPROT, INR in the last 72 hours. ABG No results for input(s): PHART, HCO3 in the last 72 hours.  Invalid input(s): PCO2, PO2  Studies/Results: No results found.  Anti-infectives: Anti-infectives    Start     Dose/Rate Route Frequency Ordered Stop   03/25/16 1800  ceFAZolin (ANCEF) IVPB 2g/100 mL premix     2 g 200 mL/hr over 30 Minutes Intravenous Every 8 hours 03/25/16 1450 03/25/16 1824   03/25/16 0830  ceFAZolin (ANCEF) 3 g in dextrose 5 % 50 mL IVPB     3 g 130 mL/hr over 30 Minutes Intravenous To ShortStay Surgical 03/24/16 1205 03/25/16 1012      Assessment/Plan: s/p Procedure(s): DIAGNOSTIC LAPAROSCOPY (N/A) INSERTION OF FEEDING TUBE (N/A) DISTAL GASTRECTOMY (N/A) -cont' FLD today.  If con't to tol PO well will adv diet -Await IR and repositioning of G/J  tube -mobilize -IS   LOS: 7 days    Rosario Jacks., [facility] 04/01/2016

## 2016-04-01 NOTE — Procedures (Signed)
Successful exchange of 18 Fr GJ tube.  J-tube is coiled in stomach but the tip is in the small bowel and ready to be used.

## 2016-04-02 LAB — GLUCOSE, CAPILLARY
GLUCOSE-CAPILLARY: 112 mg/dL — AB (ref 65–99)
GLUCOSE-CAPILLARY: 87 mg/dL (ref 65–99)
GLUCOSE-CAPILLARY: 88 mg/dL (ref 65–99)
Glucose-Capillary: 87 mg/dL (ref 65–99)
Glucose-Capillary: 104 mg/dL — ABNORMAL HIGH (ref 65–99)
Glucose-Capillary: 103 mg/dL — ABNORMAL HIGH (ref 65–99)

## 2016-04-02 MED ORDER — PRO-STAT SUGAR FREE PO LIQD
30.0000 mL | Freq: Every day | ORAL | Status: DC
  Administered 2016-04-02 – 2016-04-07 (×6): 30 mL
  Filled 2016-04-02 (×6): qty 30

## 2016-04-02 MED ORDER — JEVITY 1.5 CAL/FIBER PO LIQD
1000.0000 mL | ORAL | Status: DC
  Filled 2016-04-02 (×2): qty 1000

## 2016-04-02 MED ORDER — JEVITY 1.5 CAL/FIBER PO LIQD
1000.0000 mL | ORAL | Status: DC
  Administered 2016-04-02 – 2016-04-06 (×3): 1000 mL
  Filled 2016-04-02 (×9): qty 1000

## 2016-04-02 MED ORDER — HYDROMORPHONE HCL 1 MG/ML IJ SOLN
0.5000 mg | INTRAMUSCULAR | Status: DC | PRN
  Administered 2016-04-03: 1 mg via INTRAVENOUS
  Filled 2016-04-02: qty 1

## 2016-04-02 MED ORDER — JEVITY 1.5 CAL/FIBER PO LIQD
1000.0000 mL | ORAL | Status: DC
  Filled 2016-04-02: qty 1000

## 2016-04-02 NOTE — Progress Notes (Signed)
8 Days Post-Op  Subjective: Pt doing well this AM G/J tube replaced yesterday per IR   Objective: Vital signs in last 24 hours: Temp:  [97.9 F (36.6 C)-98.7 F (37.1 C)] 97.9 F (36.6 C) (03/20 1933) Pulse Rate:  [70-77] 77 (03/20 1933) Resp:  [18-19] 19 (03/20 1933) BP: (140-168)/(80-88) 168/88 (03/20 2328) SpO2:  [96 %-99 %] 99 % (03/20 1933) Last BM Date: 04/01/16  Intake/Output from previous day: 03/20 0701 - 03/21 0700 In: 1548 [P.O.:1478] Out: 1326 [Urine:851; Drains:475] Intake/Output this shift: No intake/output data recorded.  General appearance: alert and cooperative Cardio: regular rate and rhythm, S1, S2 normal, no murmur, click, rub or gallop GI: soft, non-tender; bowel sounds normal; no masses,  no organomegaly and Gtube in place and min drainage around tube  Lab Results:   Recent Labs  03/30/16 2120  HGB 8.3*  HCT 25.6*   BMET  Recent Labs  04/01/16 0407  NA 139  K 3.8  CL 108  CO2 22  GLUCOSE 81  BUN 24*  CREATININE 1.69*  CALCIUM 9.6   PT/INR  Recent Labs  04/01/16 1133  LABPROT 14.3  INR 1.11   ABG No results for input(s): PHART, HCO3 in the last 72 hours.  Invalid input(s): PCO2, PO2  Studies/Results: Ir Gj Tube Change  Result Date: 04/01/2016 INDICATION: History of distal gastrectomy with Billroth 2 reconstitution. Patient has a GJ feeding tube. The jejunal tube is not functioning. Request for a tube exchange. EXAM: GASTROJEJUNAL CATHETER REPLACEMENT WITH FLUOROSCOPY MEDICATIONS: None ANESTHESIA/SEDATION: None CONTRAST:  25 mL Isovue-300 FLUOROSCOPY TIME:  Fluoroscopy Time: 2 minutes and 24 seconds, 21 mGy COMPLICATIONS: None immediate. PROCEDURE: Informed written consent was obtained from the patient after a thorough discussion of the procedural risks, benefits and alternatives. All questions were addressed. Maximal Sterile Barrier Technique was utilized including caps, mask, sterile gowns, sterile gloves, sterile drape, hand  hygiene and skin antiseptic. A timeout was performed prior to the initiation of the procedure. The GJ tube catheter was prepped and draped in a sterile fashion. Glidewire was able to be advanced through the existing jejunal catheter. The skin retention sutures were removed. The existing tube was removed over the wire. A new 18 French GJ feeding tube was advanced over the wire. The distal aspect of the tube advanced into the small bowel. Proximal portion of the jejunal catheter was coiled in the stomach. The retention balloon was inflated with 8 mL of dilute contrast in the stomach. Contrast was injected through the gastric and jejunal lumens to confirm appropriate placement. Both lumens were then flushed with saline. FINDINGS: The new jejunal tube is in the small bowel. IMPRESSION: Successful exchange of the GJ feeding tube.  Tube is ready for use. Electronically Signed   By: Markus Daft M.D.   On: 04/01/2016 16:56    Anti-infectives: Anti-infectives    Start     Dose/Rate Route Frequency Ordered Stop   03/25/16 1800  ceFAZolin (ANCEF) IVPB 2g/100 mL premix     2 g 200 mL/hr over 30 Minutes Intravenous Every 8 hours 03/25/16 1450 03/25/16 1824   03/25/16 0830  ceFAZolin (ANCEF) 3 g in dextrose 5 % 50 mL IVPB     3 g 130 mL/hr over 30 Minutes Intravenous To ShortStay Surgical 03/24/16 1205 03/25/16 1012      Assessment/Plan: s/p Procedure(s): DIAGNOSTIC LAPAROSCOPY (N/A) INSERTION OF FEEDING TUBE (N/A) DISTAL GASTRECTOMY (N/A) -Will adv diet and restart TFs -Pt will require home TFs -ambulate -Hopefully home in  next 1-2d  LOS: 8 days    Rosario Jacks., [facility] 04/02/2016

## 2016-04-02 NOTE — Progress Notes (Signed)
Clarified with [place], dietician, feedings to start tonight 04/02/2016 @ 2200 and to run over 12 hours

## 2016-04-02 NOTE — Care Management Note (Signed)
Case Management Note  Patient Details  Name: Jacob [name] MRN: [id number] Date of Birth: 1945/08/25  Subjective/Objective:                    Action/Plan: Confirmed face sheet information with patient.  Patient lives with wife and has adult children checking on them daily.   Explained bedside nurse will begin teaching here before discharge.   Spoke with [employer] from [facility]. [facility] will deliver tube feeding and pump to patient's home and providing teaching at delivery and a nutritionist with [facility] will follow tube tubing , no need for [name] for tube feeds.   Patient aware of above.   Expected Discharge Date:                  Expected Discharge Plan:  Home/Self Care  In-House Referral:     Discharge planning Services  CM Consult  Post Acute Care Choice:  Durable Medical Equipment Choice offered to:  Patient  DME Arranged:  Tube feeding pump, Tube feeding DME Agency:  [place]:    [place] Agency:     Status of Service:  Completed, signed off  If discussed at [place] of Stay Meetings, dates discussed:    Additional Comments:  Marilu Favre, RN 04/02/2016, 8:43 AM

## 2016-04-02 NOTE — Progress Notes (Addendum)
Nutrition Follow-up  DOCUMENTATION CODES:   Obesity unspecified  INTERVENTION:   -Initiate Jevity 1.5 @ 20 ml/hr via j-port and increase by 10 ml every 4 hours to goal rate of 60 ml/hr over 12 hour period.   30 ml Prostat (or equivalent) daily.    Tube feeding regimen provides 1180 kcal (59% of needs), 61 grams of protein, and 547 ml of H2O.   NUTRITION DIAGNOSIS:   Inadequate oral intake related to altered GI function as evidenced by  (G/J tube dependent).  Progressing  GOAL:   Patient will meet greater than or equal to 90% of their needs  Progressing  MONITOR:   PO intake, Supplement acceptance, Labs, Weight trends, Skin, I & O's, Diet advancement, TF tolerance  REASON FOR ASSESSMENT:   Consult Enteral/tube feeding initiation and management  ASSESSMENT:   [name] referred by Dr. Alen Blew with a diagnosis of gastric cancer found October 2017. He was been being followed for prostate cancer diagnosed in 2013. He received radiation treatment as definitive therapy for that. He was seen to be anemic and early 2017 and had a bone marrow biopsy. 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   S/p Procedure(s) on 03/25/16: Diagnostic laparoscopy, distal gastrectomy with Billroth 2 reconstruction and gastrojejunostomy tube  3/15- j-tube clogged; unable to successfully unclog 3/20- underwent G-J tube exchange by IR  G-port to gravity; noted 75 ml output over the past 24 hours per doc flowsheets.   Spoke with pt at bedside, who was sitting in recliner chair at time of visit. He shares that he is not particularly hungry and does not recall eating breakfast this AM. Pt advanced to soft diet this AM. Noted meal completion 75-100%. When questioned about tolerance of full liquids, pt stated "I ate it, but couldn't eat it all".   Per surgery notes, plan to d/c home on TF. Pt will be followed by [place]. MD requesting TF meet 50% of pt needs. Will  initiate nocturnal feedings to enhance pt mobility and quality of life. Recommend feeding regimen will provide 59% of estimated kcal needs and 51% of protein needs at goal rate.   Labs reviewed.   Diet Order:  DIET SOFT Room service appropriate? Yes; Fluid consistency: Thin  Skin:   (closed abdominal incision)  Last BM:  04/01/16  Height:   Ht Readings from Last 1 Encounters:  03/25/16 _0  (1.88 m)    Weight:   Wt Readings from Last 1 Encounters:  03/30/16 263 lb 1.7 oz (119.3 kg)    Ideal Body Weight:  86.4 kg  BMI:  Body mass index is 33.78 kg/m.  Estimated Nutritional Needs:   Kcal:  2000-2200  Protein:  125-150 grams  Fluid:  1.9-2.1 L  EDUCATION NEEDS:   No education needs identified at this time  Vibhav Waddill A. Jimmye Norman, RD, LDN, CDE Pager: [phone] After hours Pager: [phone]

## 2016-04-03 LAB — GLUCOSE, CAPILLARY
GLUCOSE-CAPILLARY: 102 mg/dL — AB (ref 65–99)
Glucose-Capillary: 103 mg/dL — ABNORMAL HIGH (ref 65–99)
Glucose-Capillary: 107 mg/dL — ABNORMAL HIGH (ref 65–99)
Glucose-Capillary: 110 mg/dL — ABNORMAL HIGH (ref 65–99)
Glucose-Capillary: 117 mg/dL — ABNORMAL HIGH (ref 65–99)
Glucose-Capillary: 120 mg/dL — ABNORMAL HIGH (ref 65–99)

## 2016-04-03 MED ORDER — METOCLOPRAMIDE HCL 5 MG/ML IJ SOLN
10.0000 mg | Freq: Four times a day (QID) | INTRAMUSCULAR | Status: DC
  Administered 2016-04-03 – 2016-04-07 (×17): 10 mg via INTRAVENOUS
  Filled 2016-04-03 (×17): qty 2

## 2016-04-03 MED ORDER — ALUM & MAG HYDROXIDE-SIMETH 200-200-20 MG/5ML PO SUSP
15.0000 mL | Freq: Once | ORAL | Status: AC
  Administered 2016-04-03: 15 mL via ORAL
  Filled 2016-04-03: qty 30

## 2016-04-03 NOTE — Progress Notes (Signed)
Unable to start tube feeding at 2200 due to clogged J tube. On call Dr Ninfa Linden made aware with no new orders.

## 2016-04-03 NOTE — Progress Notes (Signed)
9 Days Post-Op  Subjective: Pt had some n/v last night with TF and they were stopped. Pt tol lunch but was full fast  Objective: Vital signs in last 24 hours: Temp:  [97.5 F (36.4 C)-98.3 F (36.8 C)] 98.1 F (36.7 C) (03/22 0426) Pulse Rate:  [68-88] 88 (03/22 0426) Resp:  [10-19] 10 (03/22 0426) BP: (148-150)/(74-92) 150/77 (03/22 0426) SpO2:  [97 %-98 %] 98 % (03/22 0426) Weight:  [115.5 kg (254 lb 10.1 oz)] 115.5 kg (254 lb 10.1 oz) (03/22 0426) Last BM Date: 04/01/16  Intake/Output from previous day: 03/21 0701 - 03/22 0700 In: 512 [P.O.:322; NG/GT:110] Out: 500 [Urine:325; Drains:175] Intake/Output this shift: No intake/output data recorded.  General appearance: alert and cooperative Cardio: regular rate and rhythm, S1, S2 normal, no murmur, click, rub or gallop GI: soft, non-tender; bowel sounds normal; no masses,  no organomegaly  Lab Results:  No results for input(s): WBC, HGB, HCT, PLT in the last 72 hours. BMET  Recent Labs  04/01/16 0407  NA 139  K 3.8  CL 108  CO2 22  GLUCOSE 81  BUN 24*  CREATININE 1.69*  CALCIUM 9.6   PT/INR  Recent Labs  04/01/16 1133  LABPROT 14.3  INR 1.11   ABG No results for input(s): PHART, HCO3 in the last 72 hours.  Invalid input(s): PCO2, PO2  Studies/Results: Ir Gj Tube Change  Result Date: 04/01/2016 INDICATION: History of distal gastrectomy with Billroth 2 reconstitution. Patient has a GJ feeding tube. The jejunal tube is not functioning. Request for a tube exchange. EXAM: GASTROJEJUNAL CATHETER REPLACEMENT WITH FLUOROSCOPY MEDICATIONS: None ANESTHESIA/SEDATION: None CONTRAST:  25 mL Isovue-300 FLUOROSCOPY TIME:  Fluoroscopy Time: 2 minutes and 24 seconds, 21 mGy COMPLICATIONS: None immediate. PROCEDURE: Informed written consent was obtained from the patient after a thorough discussion of the procedural risks, benefits and alternatives. All questions were addressed. Maximal Sterile Barrier Technique was utilized  including caps, mask, sterile gowns, sterile gloves, sterile drape, hand hygiene and skin antiseptic. A timeout was performed prior to the initiation of the procedure. The GJ tube catheter was prepped and draped in a sterile fashion. Glidewire was able to be advanced through the existing jejunal catheter. The skin retention sutures were removed. The existing tube was removed over the wire. A new 18 French GJ feeding tube was advanced over the wire. The distal aspect of the tube advanced into the small bowel. Proximal portion of the jejunal catheter was coiled in the stomach. The retention balloon was inflated with 8 mL of dilute contrast in the stomach. Contrast was injected through the gastric and jejunal lumens to confirm appropriate placement. Both lumens were then flushed with saline. FINDINGS: The new jejunal tube is in the small bowel. IMPRESSION: Successful exchange of the GJ feeding tube.  Tube is ready for use. Electronically Signed   By: Markus Daft M.D.   On: 04/01/2016 16:56    Anti-infectives: Anti-infectives    Start     Dose/Rate Route Frequency Ordered Stop   03/25/16 1800  ceFAZolin (ANCEF) IVPB 2g/100 mL premix     2 g 200 mL/hr over 30 Minutes Intravenous Every 8 hours 03/25/16 1450 03/25/16 1824   03/25/16 0830  ceFAZolin (ANCEF) 3 g in dextrose 5 % 50 mL IVPB     3 g 130 mL/hr over 30 Minutes Intravenous To ShortStay Surgical 03/24/16 1205 03/25/16 1012      Assessment/Plan: s/p Procedure(s): DIAGNOSTIC LAPAROSCOPY (N/A) INSERTION OF FEEDING TUBE (N/A) DISTAL GASTRECTOMY (N/A) Will  add Reglan to his regimen  Mobilize in hall with assistance Will reattempt TFs overnight as ordered and see if tolerates it better with reglan.    LOS: 9 days    Rosario Jacks., [facility] 04/03/2016

## 2016-04-03 NOTE — Progress Notes (Signed)
TF was started per order @20 /mLx 4hrs, and pt tolerated well without any N/V or pain. After 4hrs TF was increased to 30/mL and after a hour pt started to have N/V and pain in their abdomen, so TF was stop. RN will continue to monitor pt for N/V , and pain.

## 2016-04-03 NOTE — Evaluation (Signed)
Physical Therapy Evaluation Patient Details Name: Jacob [name] MRN: [id number] DOB: 02/07/1945 Today's Date: 04/03/2016   History of Present Illness  Pt admitted Adenocarcinoma of stomach with distal gastrectomy,stomach drain. Also got  feeding tube.PMH:  STage 3 kidney disease, HTN.    Clinical Impression  Pt admitted with above diagnosis. Pt currently with functional limitations due to the deficits listed below (see PT Problem List). Pt was able to ambulate with RW with good safety overall.  Will follow acutely.  Pt will benefit from skilled PT to increase their independence and safety with mobility to allow discharge to the venue listed below.      Follow Up Recommendations Home health PT;Supervision/Assistance - 24 hour    Equipment Recommendations  Rolling walker with 5" wheels (Needs Tall RW)    Recommendations for Other Services       Precautions / Restrictions Precautions Precautions: Fall Restrictions Weight Bearing Restrictions: No      Mobility  Bed Mobility Overal bed mobility: Independent                Transfers Overall transfer level: Needs assistance Equipment used: Rolling walker (2 wheeled) Transfers: Sit to/from Stand Sit to Stand: Supervision            Ambulation/Gait Ambulation/Gait assistance: Min guard Ambulation Distance (Feet): 380 Feet Assistive device: Rolling walker (2 wheeled) Gait Pattern/deviations: Step-through pattern;Decreased stride length;Wide base of support   Gait velocity interpretation: Below normal speed for age/gender General Gait Details: Cues to stay close to RW as pt pushes too far at times.  Pt steady overall withRW.   Stairs            Wheelchair Mobility    Modified Rankin (Stroke Patients Only)       Balance Overall balance assessment: Needs assistance Sitting-balance support: No upper extremity supported;Feet supported Sitting balance-Leahy Scale: Fair     Standing balance support:  Bilateral upper extremity supported;During functional activity Standing balance-Leahy Scale: Poor Standing balance comment: relies on RW for support                             Pertinent Vitals/Pain Pain Assessment: No/denies pain  VSS  Home Living Family/patient expects to be discharged to:: Private residence Living Arrangements: Spouse/significant other Available Help at Discharge: Family Type of Home: House Home Access: Stairs to enter Entrance Stairs-Rails: Right;Left;Can reach both Technical brewer of Steps: 4 Home Layout: One level Home Equipment: Bedside commode      Prior Function Level of Independence: Independent               Hand Dominance        Extremity/Trunk Assessment   Upper Extremity Assessment Upper Extremity Assessment: Defer to OT evaluation    Lower Extremity Assessment Lower Extremity Assessment: Generalized weakness    Cervical / Trunk Assessment Cervical / Trunk Assessment: Normal  Communication   Communication: No difficulties  Cognition Arousal/Alertness: Awake/alert Behavior During Therapy: WFL for tasks assessed/performed Overall Cognitive Status: Within Functional Limits for tasks assessed                      General Comments      Exercises     Assessment/Plan    PT Assessment Patient needs continued PT services  PT Problem List Decreased activity tolerance;Decreased balance;Decreased mobility;Decreased safety awareness;Decreased knowledge of precautions;Cardiopulmonary status limiting activity       PT Treatment Interventions DME instruction;Gait training;Stair  training;Functional mobility training;Therapeutic activities;Therapeutic exercise;Balance training;Patient/family education    PT Goals (Current goals can be found in the Care Plan section)  Acute Rehab PT Goals Patient Stated Goal: to go home PT Goal Formulation: With patient Time For Goal Achievement: 04/10/16 Potential to  Achieve Goals: Good    Frequency Min 3X/week   Barriers to discharge Decreased caregiver support (wife cant help pt as she uses walker)      Co-evaluation               End of Session Equipment Utilized During Treatment: Gait belt Activity Tolerance: Patient limited by fatigue Patient left: in chair;with call bell/phone within reach;with chair alarm set Nurse Communication: Mobility status PT Visit Diagnosis: Unsteadiness on feet (R26.81);Muscle weakness (generalized) (M62.81)         Time: 3570-1779 PT Time Calculation (min) (ACUTE ONLY): 15 min   Charges:   PT Evaluation $PT Eval Moderate Complexity: 1 Procedure     PT G Codes:         Trenten Watchman F Merril Nagy 09-Apr-2016, 2:09 PM [facility] Acute Rehabilitation [phone] (pager)

## 2016-04-04 LAB — GLUCOSE, CAPILLARY
GLUCOSE-CAPILLARY: 98 mg/dL (ref 65–99)
GLUCOSE-CAPILLARY: 106 mg/dL — AB (ref 65–99)
GLUCOSE-CAPILLARY: 96 mg/dL (ref 65–99)
Glucose-Capillary: 119 mg/dL — ABNORMAL HIGH (ref 65–99)
Glucose-Capillary: 83 mg/dL (ref 65–99)
Glucose-Capillary: 96 mg/dL (ref 65–99)

## 2016-04-04 LAB — RENAL FUNCTION PANEL
ALBUMIN: 2.2 g/dL — AB (ref 3.5–5.0)
ANION GAP: 9 (ref 5–15)
BUN: 28 mg/dL — ABNORMAL HIGH (ref 6–20)
CALCIUM: 9.5 mg/dL (ref 8.9–10.3)
CHLORIDE: 105 mmol/L (ref 101–111)
CO2: 22 mmol/L (ref 22–32)
CREATININE: 1.98 mg/dL — AB (ref 0.61–1.24)
GFR calc Af Amer: 38 mL/min — ABNORMAL LOW (ref >60)
GFR, EST NON AFRICAN AMERICAN: 32 mL/min — AB (ref >60)
Glucose, Bld: 89 mg/dL (ref 65–99)
PHOSPHORUS: 3.3 mg/dL (ref 2.5–4.6)
Potassium: 4.1 mmol/L (ref 3.5–5.1)
Sodium: 136 mmol/L (ref 135–145)

## 2016-04-04 MED ORDER — SODIUM BICARBONATE 650 MG PO TABS
650.0000 mg | ORAL_TABLET | Freq: Once | ORAL | Status: AC
  Administered 2016-04-04: 650 mg via ORAL
  Filled 2016-04-04: qty 1

## 2016-04-04 MED ORDER — PANCRELIPASE (LIP-PROT-AMYL) 12000-38000 UNITS PO CPEP
12000.0000 [IU] | ORAL_CAPSULE | Freq: Once | ORAL | Status: AC
  Administered 2016-04-04: 12000 [IU] via ORAL
  Filled 2016-04-04: qty 1

## 2016-04-04 NOTE — Progress Notes (Signed)
10 Days Post-Op  Subjective: Con't with Jtube problems.  TFs were not able to be started last night due to clogged Jtube.  Pt only had a few hours of TFs after it was changed by IR and has had nothing else since then. Pt o/w tol soft diet well with no n/v  Objective: Vital signs in last 24 hours: Temp:  [98.1 F (36.7 C)-99 F (37.2 C)] 98.6 F (37 C) (03/23 0435) Pulse Rate:  [70-81] 70 (03/23 0435) Resp:  [17-18] 18 (03/23 0435) BP: (128-156)/(71-90) 128/71 (03/23 0435) SpO2:  [95 %-99 %] 97 % (03/23 0435) Weight:  [111 kg (244 lb 11.4 oz)] 111 kg (244 lb 11.4 oz) (03/23 0435) Last BM Date: 04/01/16  Intake/Output from previous day: 03/22 0701 - 03/23 0700 In: 840 [P.O.:780] Out: 1800 [Urine:1295; Drains:505] Intake/Output this shift: No intake/output data recorded.  General appearance: alert and cooperative Cardio: regular rate and rhythm, S1, S2 normal, no murmur, click, rub or gallop GI: soft, non-tender; bowel sounds normal; no masses,  no organomegaly and Jtube in place  Lab Results:  No results for input(s): WBC, HGB, HCT, PLT in the last 72 hours. BMET  Recent Labs  04/04/16 0442  NA 136  K 4.1  CL 105  CO2 22  GLUCOSE 89  BUN 28*  CREATININE 1.98*  CALCIUM 9.5   PT/INR  Recent Labs  04/01/16 1133  LABPROT 14.3  INR 1.11   ABG No results for input(s): PHART, HCO3 in the last 72 hours.  Invalid input(s): PCO2, PO2  Studies/Results: No results found.  Anti-infectives: Anti-infectives    Start     Dose/Rate Route Frequency Ordered Stop   03/25/16 1800  ceFAZolin (ANCEF) IVPB 2g/100 mL premix     2 g 200 mL/hr over 30 Minutes Intravenous Every 8 hours 03/25/16 1450 03/25/16 1824   03/25/16 0830  ceFAZolin (ANCEF) 3 g in dextrose 5 % 50 mL IVPB     3 g 130 mL/hr over 30 Minutes Intravenous To ShortStay Surgical 03/24/16 1205 03/25/16 1012      Assessment/Plan: s/p Procedure(s): DIAGNOSTIC LAPAROSCOPY (N/A) INSERTION OF FEEDING TUBE  (N/A) DISTAL GASTRECTOMY (N/A) WIll ask IR to eval tube and possible change tube if unable to cannulate as patient will need to sent home with TFs. MObilize with PT.  Will order [place] soon if can tolerate 1-2 nights with TFs without n/v  LOS: 10 days    Rosario Jacks., [facility] 04/04/2016

## 2016-04-04 NOTE — Care Management Important Message (Signed)
Important Message  Patient Details  Name: Jacob [name] MRN: [id number] Date of Birth: 11-12-1945   Medicare Important Message Given:  Yes    [name] [address] 04/04/2016, 10:32 AM

## 2016-04-04 NOTE — Care Management Note (Addendum)
Case Management Note  Patient Details  Name: Mazin Emma MRN: [id number] Date of Birth: 1945/06/30  Subjective/Objective:                    Action/Plan:   Expected Discharge Date:                  Expected Discharge Plan:  [place]  In-House Referral:     Discharge planning Services  CM Consult  Post Acute Care Choice:  Durable Medical Equipment Choice offered to:  Patient  DME Arranged:  Tube feeding pump, Tube feeding DME Agency:  [place]:  PT, RN [facility] Agency:  [place]  Status of Service:  Completed, signed off  If discussed at [place] of Stay Meetings, dates discussed:    Additional Comments:  Marilu Favre, RN 04/04/2016, 10:44 AM

## 2016-04-05 ENCOUNTER — Encounter (HOSPITAL_COMMUNITY): Payer: Self-pay | Admitting: Diagnostic Radiology

## 2016-04-05 ENCOUNTER — Inpatient Hospital Stay (HOSPITAL_COMMUNITY): Payer: Medicare Other

## 2016-04-05 HISTORY — PX: IR GENERIC HISTORICAL: IMG1180011

## 2016-04-05 LAB — GLUCOSE, CAPILLARY
GLUCOSE-CAPILLARY: 93 mg/dL (ref 65–99)
GLUCOSE-CAPILLARY: 82 mg/dL (ref 65–99)
GLUCOSE-CAPILLARY: 100 mg/dL — AB (ref 65–99)
Glucose-Capillary: 110 mg/dL — ABNORMAL HIGH (ref 65–99)
Glucose-Capillary: 110 mg/dL — ABNORMAL HIGH (ref 65–99)
Glucose-Capillary: 96 mg/dL (ref 65–99)

## 2016-04-05 IMAGING — XA IR REPLACE G/J TUBE W/ FLUORO
1 series · 6 of 6 positions shown · non-contrast
Comparison: none

INDICATION: History of distal pancreatectomy and Billroth 2 reconstruction.
Patient has a GJ tube for tube feeds and gastric decompression.
Recurrent occlusion of the jejunal tube. Patient continues to need
tube feeds.

[Series 300: ir gastric tube perc chg w/o img guide · non-contrast · 6 of 6 slices shown]
[im 1/6]
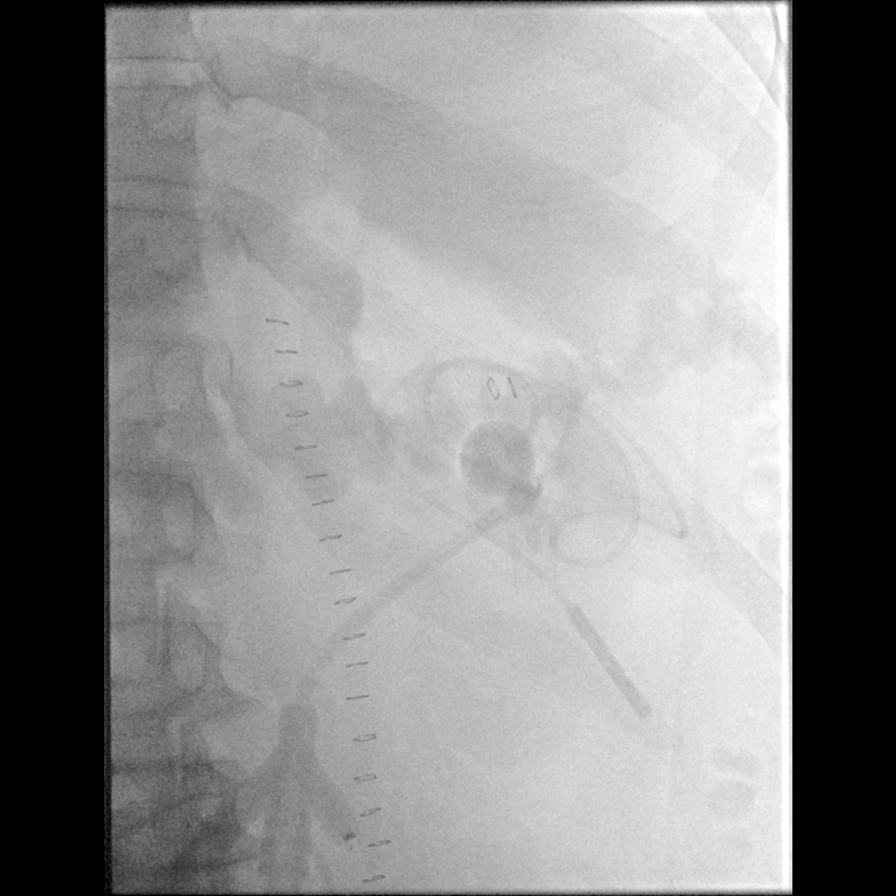
[im 2/6]
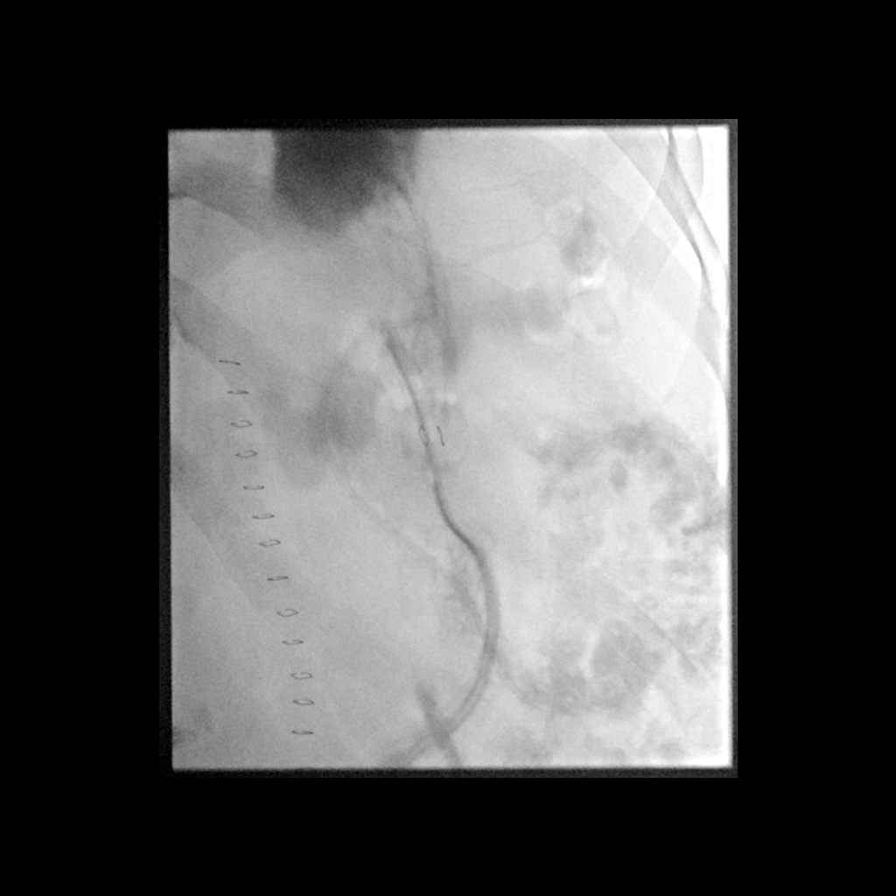
[im 3/6]
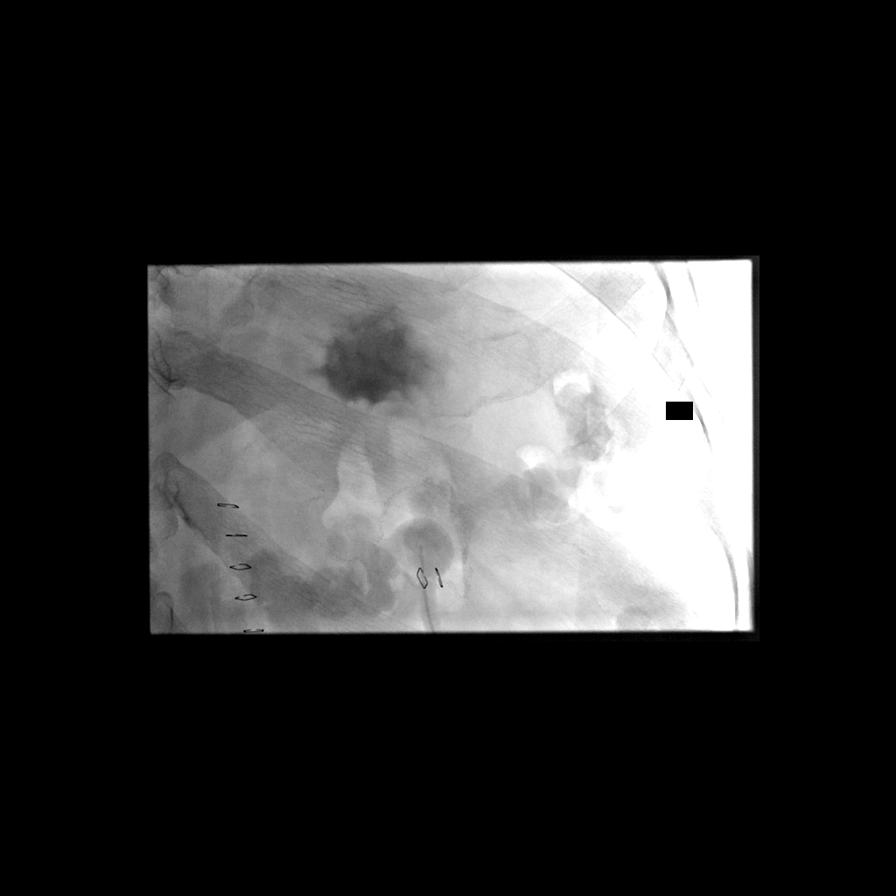
[im 4/6]
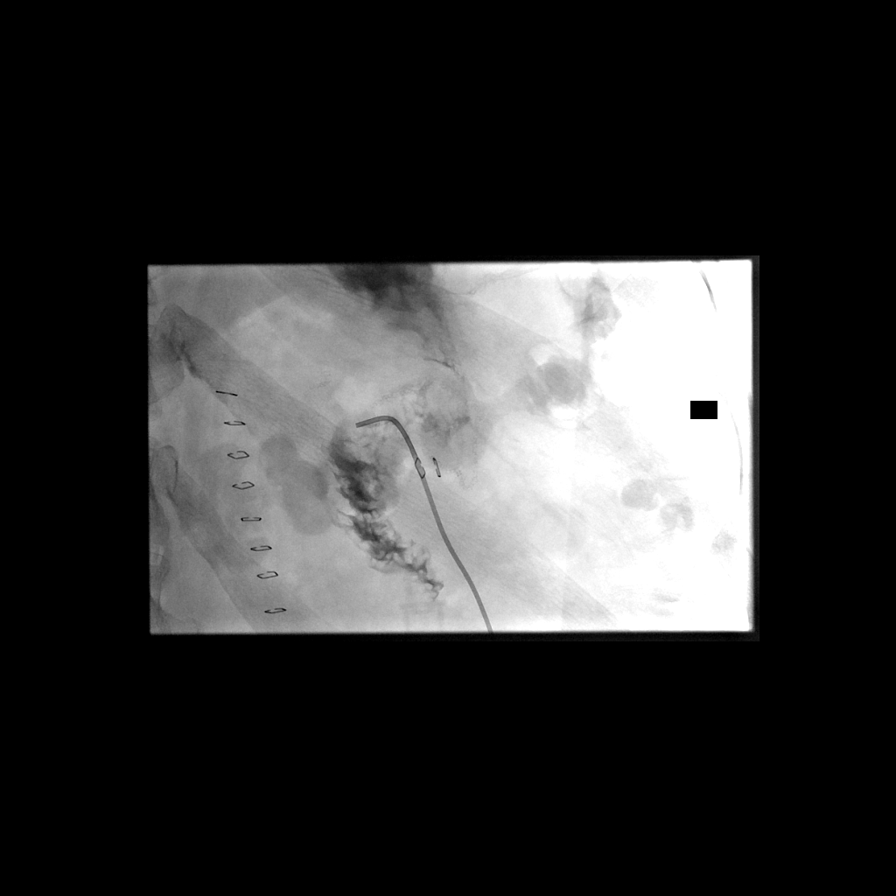
[im 5/6]
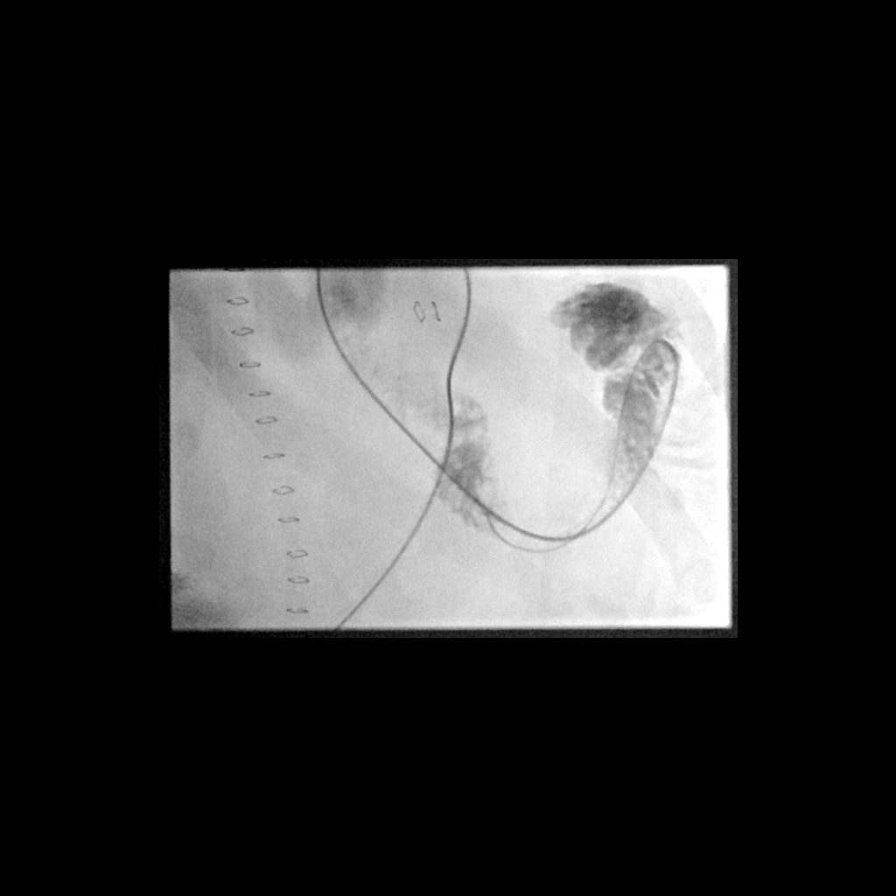
[im 6/6]
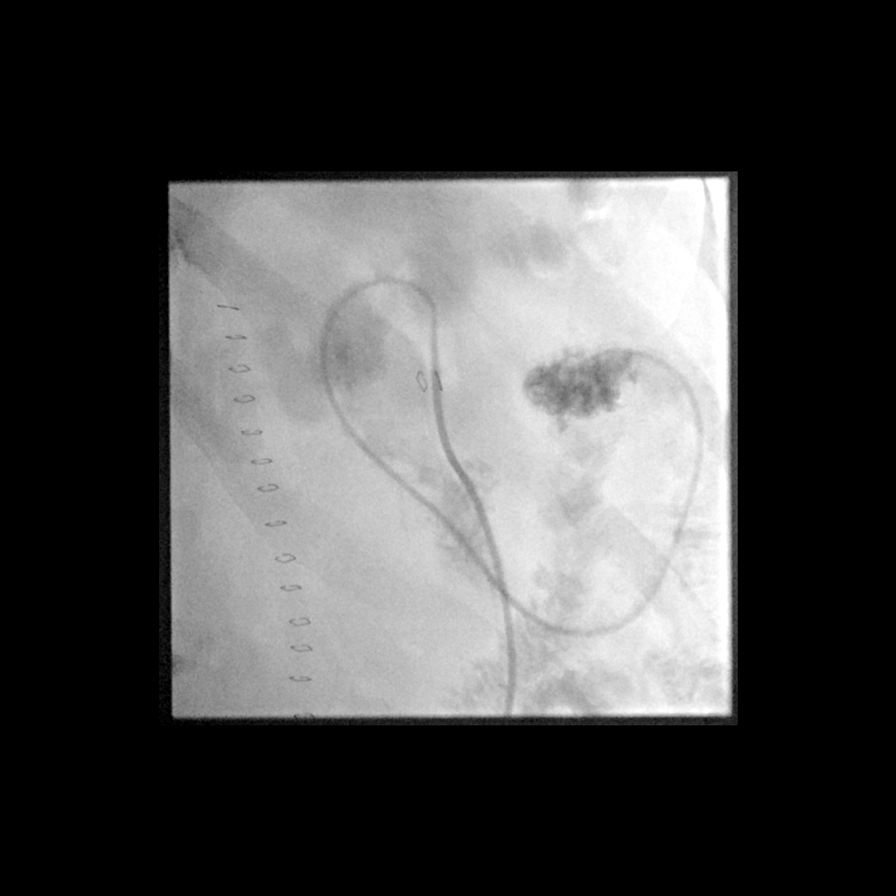

[6 of 6 positions shown; findings below may reference images not displayed]

EXAM:
EXCHANGE OF GJ TUBE WITH FLUOROSCOPY

MEDICATIONS:
None

ANESTHESIA/SEDATION:
None

CONTRAST:  30 mL [XR] - administered into the gastric lumen.

FLUOROSCOPY TIME:  Fluoroscopy Time: 9 minutes 18 seconds (61 mGy).

COMPLICATIONS:
None immediate.

PROCEDURE:
Informed written consent was obtained from the patient after a
thorough discussion of the procedural risks, benefits and
alternatives. All questions were addressed. Maximal Sterile Barrier
Technique was utilized including caps, mask, sterile gowns, sterile
gloves, sterile drape, hand hygiene and skin antiseptic. A timeout
was performed prior to the initiation of the procedure.

The jejunal tube was completely occluded and unable to flush
anything through it. The retention balloon was decompressed and the
gastric portion of the tube was removed. Immediately, a large amount
of dark reddish colored fluid was draining from the stomach. The
jejunal tube was partially pulled out and then cut. A Glidewire was
successfully advanced through the jejunal tube with much difficulty.
The tube was occluded with tube feeds. However, the jejunal tube
retracted into the stomach during wire placement. Five French
catheter was advanced in the stomach and contrast injection
demonstrated gastric distention and the small bowel anastomosis was
not easily identified. Therefore, an 18 French gastrostomy tube was
temporally placed for decompression. 350 mL of dark gastric fluid
was removed. After stomach decompression, the jejunal anastomosis
was easily visualized. Five French Kumpe catheter was advanced into
the small bowel. Bentson wire was placed. A 20 French GJ tube was
selected. The end of the tube was cut off in order to shorten the
length. The tube was easily advanced over the Bentson wire in the
tip was well positioned in the jejunum. Contrast injection confirmed
placement in the small bowel. The retention balloon was inflated
with 7 mL of dilute contrast. Gastric lumen was injected to confirm
placement in the stomach.

Fluoroscopic images were taken and saved for this procedure.
IMPRESSION: Successful replacement of the GJ feeding tube with fluoroscopy. The
jejunal tube is well positioned in the small bowel.

The stomach was markedly distended. Approximately 350 mL of fluid
was aspirated from the stomach during this procedure.

## 2016-04-05 MED ORDER — IOPAMIDOL (ISOVUE-300) INJECTION 61%
INTRAVENOUS | Status: AC
  Administered 2016-04-05: 30 mL
  Filled 2016-04-05: qty 50

## 2016-04-05 NOTE — Progress Notes (Signed)
Received pt back from IR, post G-J tube replacement. Connected G port to dependent drainage bag, J port flushed and tube feeding restarted at 71ml/hr.

## 2016-04-05 NOTE — Procedures (Signed)
  Pre-operative Diagnosis: Occluded J-tube.  History partial gastrectomy and needs GJ tube for decompression and tube feeds       Post-operative Diagnosis:  Occluded J-tube.  History partial gastrectomy and needs GJ tube for decompression and tube feeds   Indications: Occluded feeding tube  Procedure:   Findings: J-tube was occluded with tube feeds.  Marked stomach distention, removed 350 ml of fluid from stomach.  Placed new 20 Fr GJ tube.  J-tube is in small bowel and well positioned.    Complications: None     EBL: Minimal  Plan: May use tube for decompression and tube feeds.

## 2016-04-05 NOTE — Progress Notes (Signed)
[facility] Progress Note  11 Days Post-Op  Subjective: No acute events overnight. Pt having flatus and had a BM yesterday morning. No nausea or vomiting. Anxious to go home.  Objective: Vital signs in last 24 hours: Temp:  [98.1 F (36.7 C)-99 F (37.2 C)] 99 F (37.2 C) (03/24 0407) Pulse Rate:  [74-81] 74 (03/24 0407) Resp:  [18-19] 18 (03/24 0407) BP: (141-152)/(75-85) 141/75 (03/24 0407) SpO2:  [95 %-97 %] 95 % (03/24 0407) Weight:  [244 lb 7.8 oz (110.9 kg)] 244 lb 7.8 oz (110.9 kg) (03/24 0407) Last BM Date: 04/04/16  Intake/Output from previous day: 03/23 0701 - 03/24 0700 In: 120 [P.O.:120] Out: 2700 [Urine:1400; Drains:1300] Intake/Output this shift: No intake/output data recorded.  PE: Gen:  Alert, NAD, pleasant, cooperative, well appearing Card:  RRR, no M/G/R heard Pulm:  CTA, no W/R/R, effort normal Abd: Soft, not distended, +BS, incision with staples C/D/I without drainage or surrounding erythema, JG tube in place Skin: no rashes noted, warm and dry  Lab Results:  No results for input(s): WBC, HGB, HCT, PLT in the last 72 hours. BMET  Recent Labs  04/04/16 0442  NA 136  K 4.1  CL 105  CO2 22  GLUCOSE 89  BUN 28*  CREATININE 1.98*  CALCIUM 9.5   PT/INR No results for input(s): LABPROT, INR in the last 72 hours. CMP     Component Value Date/Time   NA 136 04/04/2016 0442   NA 138 03/12/2016 1002   K 4.1 04/04/2016 0442   K 4.5 03/12/2016 1002   CL 105 04/04/2016 0442   CO2 22 04/04/2016 0442   CO2 21 (L) 03/12/2016 1002   GLUCOSE 89 04/04/2016 0442   GLUCOSE 96 03/12/2016 1002   BUN 28 (H) 04/04/2016 0442   BUN 20.7 03/12/2016 1002   CREATININE 1.98 (H) 04/04/2016 0442   CREATININE 1.6 (H) 03/12/2016 1002   CALCIUM 9.5 04/04/2016 0442   CALCIUM 9.9 03/12/2016 1002   PROT 6.9 03/12/2016 1002   ALBUMIN 2.2 (L) 04/04/2016 0442   ALBUMIN 2.9 (L) 03/12/2016 1002   AST 30 03/12/2016 1002   ALT 21 03/12/2016 1002   ALKPHOS  48 03/12/2016 1002   BILITOT 0.42 03/12/2016 1002   GFRNONAA 32 (L) 04/04/2016 0442   GFRAA 38 (L) 04/04/2016 0442   Lipase     Component Value Date/Time   LIPASE 49 11/27/2015 0825       Studies/Results: No results found.  Anti-infectives: Anti-infectives    Start     Dose/Rate Route Frequency Ordered Stop   03/25/16 1800  ceFAZolin (ANCEF) IVPB 2g/100 mL premix     2 g 200 mL/hr over 30 Minutes Intravenous Every 8 hours 03/25/16 1450 03/25/16 1824   03/25/16 0830  ceFAZolin (ANCEF) 3 g in dextrose 5 % 50 mL IVPB     3 g 130 mL/hr over 30 Minutes Intravenous To ShortStay Surgical 03/24/16 1205 03/25/16 1012       Assessment/Plan s/p Procedure(s): DIAGNOSTIC LAPAROSCOPY (N/A) INSERTION OF FEEDING TUBE (N/A) DISTAL GASTRECTOMY (N/A)  Consulted IR yesterday and asked them to eval tube and change tube if unable to cannulate as patient will need to sent home with TFs. They stated they would likely not have time yesterday and if not will see pt today. Mobilize with PT. [place] soon if can tolerate 1-2 nights with TFs without n/v   LOS: 11 days    Kalman Drape , [facility] 04/05/2016, 7:49 AM  Pager: [phone] Consults: [phone] Mon-Fri 7:00 am-4:30 pm Sat-Sun 7:00 am-11:30 am

## 2016-04-06 LAB — BASIC METABOLIC PANEL
ANION GAP: 9 (ref 5–15)
BUN: 27 mg/dL — ABNORMAL HIGH (ref 6–20)
CO2: 22 mmol/L (ref 22–32)
Calcium: 9.7 mg/dL (ref 8.9–10.3)
Chloride: 102 mmol/L (ref 101–111)
Creatinine, Ser: 2.12 mg/dL — ABNORMAL HIGH (ref 0.61–1.24)
GFR calc Af Amer: 35 mL/min — ABNORMAL LOW (ref >60)
GFR calc non Af Amer: 30 mL/min — ABNORMAL LOW (ref >60)
GLUCOSE: 103 mg/dL — AB (ref 65–99)
POTASSIUM: 4.2 mmol/L (ref 3.5–5.1)
SODIUM: 133 mmol/L — AB (ref 135–145)

## 2016-04-06 LAB — GLUCOSE, CAPILLARY
GLUCOSE-CAPILLARY: 110 mg/dL — AB (ref 65–99)
GLUCOSE-CAPILLARY: 104 mg/dL — AB (ref 65–99)
GLUCOSE-CAPILLARY: 107 mg/dL — AB (ref 65–99)
Glucose-Capillary: 104 mg/dL — ABNORMAL HIGH (ref 65–99)
Glucose-Capillary: 118 mg/dL — ABNORMAL HIGH (ref 65–99)

## 2016-04-06 LAB — CBC
HCT: 28.6 % — ABNORMAL LOW (ref 39.0–52.0)
Hemoglobin: 9.2 g/dL — ABNORMAL LOW (ref 13.0–17.0)
MCH: 30.4 pg (ref 26.0–34.0)
MCHC: 32.2 g/dL (ref 30.0–36.0)
MCV: 94.4 fL (ref 78.0–100.0)
PLATELETS: 274 10*3/uL (ref 150–400)
RBC: 3.03 MIL/uL — AB (ref 4.22–5.81)
RDW: 17.9 % — ABNORMAL HIGH (ref 11.5–15.5)
WBC: 7.5 10*3/uL (ref 4.0–10.5)

## 2016-04-06 NOTE — Progress Notes (Signed)
Had been following Jacob [name] at a distance from a renal standpoint.  He has a history of CKD with creatinine runs mostly from the 1.6-1.8 range- occasionally into the 2's.  He had some A on CRF this hosp which trended better but now seems to be trending up again.  I think he is still in a reasonable range as far as kidney function and think the elevations is due to procedures and just recovering from major surgery.  I see plan is for discharge soon.  I will make sure has appropriate follow up from [place].  If concerns during rest of admission, please call   Bertin Inabinet A

## 2016-04-06 NOTE — Progress Notes (Signed)
J port flushed with water w/o difficulty every 4hrs.

## 2016-04-06 NOTE — Progress Notes (Signed)
[facility] Progress Note  12 Days Post-Op  Subjective: Pt tolerated tube feeds last night. No nausea or vomiting, fever or chills. No acute events overnight.   Objective: Vital signs in last 24 hours: Temp:  [98.8 F (37.1 C)-100.5 F (38.1 C)] 99.5 F (37.5 C) (03/25 0416) Pulse Rate:  [80-89] 80 (03/25 0416) Resp:  [18] 18 (03/25 0416) BP: (136-155)/(75-86) 136/86 (03/25 0416) SpO2:  [92 %-95 %] 94 % (03/25 0416) Last BM Date: 04/05/16  Intake/Output from previous day: 03/24 0701 - 03/25 0700 In: 84 [P.O.:170] Out: 2825 [Urine:575; Drains:2250] Intake/Output this shift: Total I/O In: -  Out: 525 [Urine:225; Drains:300]  PE: Gen:  Alert, NAD, pleasant, cooperative, well appearing Card:  RRR, no M/G/R heard Pulm:  CTA, no W/R/R, effort normal Abd: Soft, not distended, +BS, incision with staples C/D/I without drainage or surrounding erythema, JG tube in place Skin: no rashes noted, warm and dry  Lab Results:   Recent Labs  04/06/16 0434  WBC 7.5  HGB 9.2*  HCT 28.6*  PLT 274   BMET  Recent Labs  04/04/16 0442 04/06/16 0434  NA 136 133*  K 4.1 4.2  CL 105 102  CO2 22 22  GLUCOSE 89 103*  BUN 28* 27*  CREATININE 1.98* 2.12*  CALCIUM 9.5 9.7   PT/INR No results for input(s): LABPROT, INR in the last 72 hours. CMP     Component Value Date/Time   NA 133 (L) 04/06/2016 0434   NA 138 03/12/2016 1002   K 4.2 04/06/2016 0434   K 4.5 03/12/2016 1002   CL 102 04/06/2016 0434   CO2 22 04/06/2016 0434   CO2 21 (L) 03/12/2016 1002   GLUCOSE 103 (H) 04/06/2016 0434   GLUCOSE 96 03/12/2016 1002   BUN 27 (H) 04/06/2016 0434   BUN 20.7 03/12/2016 1002   CREATININE 2.12 (H) 04/06/2016 0434   CREATININE 1.6 (H) 03/12/2016 1002   CALCIUM 9.7 04/06/2016 0434   CALCIUM 9.9 03/12/2016 1002   PROT 6.9 03/12/2016 1002   ALBUMIN 2.2 (L) 04/04/2016 0442   ALBUMIN 2.9 (L) 03/12/2016 1002   AST 30 03/12/2016 1002   ALT 21 03/12/2016 1002   ALKPHOS  48 03/12/2016 1002   BILITOT 0.42 03/12/2016 1002   GFRNONAA 30 (L) 04/06/2016 0434   GFRAA 35 (L) 04/06/2016 0434   Lipase     Component Value Date/Time   LIPASE 49 11/27/2015 0825       Studies/Results: Ir Gj Tube Change  Result Date: 04/05/2016 INDICATION: History of distal pancreatectomy and Billroth 2 reconstruction. Patient has a GJ tube for tube feeds and gastric decompression. Recurrent occlusion of the jejunal tube. Patient continues to need tube feeds. EXAM: EXCHANGE OF GJ TUBE WITH FLUOROSCOPY MEDICATIONS: None ANESTHESIA/SEDATION: None CONTRAST:  30 mL Isovue-300 - administered into the gastric lumen. FLUOROSCOPY TIME:  Fluoroscopy Time: 9 minutes 18 seconds (61 mGy). COMPLICATIONS: None immediate. PROCEDURE: Informed written consent was obtained from the patient after a thorough discussion of the procedural risks, benefits and alternatives. All questions were addressed. Maximal Sterile Barrier Technique was utilized including caps, mask, sterile gowns, sterile gloves, sterile drape, hand hygiene and skin antiseptic. A timeout was performed prior to the initiation of the procedure. The jejunal tube was completely occluded and unable to flush anything through it. The retention balloon was decompressed and the gastric portion of the tube was removed. Immediately, a large amount of dark reddish colored fluid was draining from the stomach. The jejunal tube  was partially pulled out and then cut. A Glidewire was successfully advanced through the jejunal tube with much difficulty. The tube was occluded with tube feeds. However, the jejunal tube retracted into the stomach during wire placement. Five French catheter was advanced in the stomach and contrast injection demonstrated gastric distention and the small bowel anastomosis was not easily identified. Therefore, an 2 French gastrostomy tube was temporally placed for decompression. 350 mL of dark gastric fluid was removed. After stomach  decompression, the jejunal anastomosis was easily visualized. Five [place] Kumpe catheter was advanced into the small bowel. Bentson wire was placed. A 20 French GJ tube was selected. The end of the tube was cut off in order to shorten the length. The tube was easily advanced over the Bentson wire in the tip was well positioned in the jejunum. Contrast injection confirmed placement in the small bowel. The retention balloon was inflated with 7 mL of dilute contrast. Gastric lumen was injected to confirm placement in the stomach. Fluoroscopic images were taken and saved for this procedure. IMPRESSION: Successful replacement of the GJ feeding tube with fluoroscopy. The jejunal tube is well positioned in the small bowel. The stomach was markedly distended. Approximately 350 mL of fluid was aspirated from the stomach during this procedure. Electronically Signed   By: Markus Daft M.D.   On: 04/05/2016 14:08    Anti-infectives: Anti-infectives    Start     Dose/Rate Route Frequency Ordered Stop   03/25/16 1800  ceFAZolin (ANCEF) IVPB 2g/100 mL premix     2 g 200 mL/hr over 30 Minutes Intravenous Every 8 hours 03/25/16 1450 03/25/16 1824   03/25/16 0830  ceFAZolin (ANCEF) 3 g in dextrose 5 % 50 mL IVPB     3 g 130 mL/hr over 30 Minutes Intravenous To ShortStay Surgical 03/24/16 1205 03/25/16 1012       Assessment/Plan s/p Procedure(s): DIAGNOSTIC LAPAROSCOPY (N/A) INSERTION OF FEEDING TUBE (N/A) DISTAL GASTRECTOMY (N/A)  IR replaced tube yesterday, tolerated tube feeds overnight. If tolerates again tonight pt can be discharged tomorrow. Appreciate IR's help with this pt Mobilize with PT. HHPT   LOS: 12 days    Kalman Drape , [facility] 04/06/2016, 8:48 AM Pager: [phone] Consults: [phone] Mon-Fri 7:00 am-4:30 pm Sat-Sun 7:00 am-11:30 am

## 2016-04-07 LAB — GLUCOSE, CAPILLARY
GLUCOSE-CAPILLARY: 103 mg/dL — AB (ref 65–99)
Glucose-Capillary: 104 mg/dL — ABNORMAL HIGH (ref 65–99)
Glucose-Capillary: 117 mg/dL — ABNORMAL HIGH (ref 65–99)
Glucose-Capillary: 135 mg/dL — ABNORMAL HIGH (ref 65–99)

## 2016-04-07 MED ORDER — JEVITY 1.5 CAL/FIBER PO LIQD: 1000.0000 mL | ORAL | 12 refills | Status: DC

## 2016-04-07 MED ORDER — OXYCODONE HCL 5 MG PO TABS: 5.0000 mg | ORAL_TABLET | ORAL | 0 refills | Status: DC | PRN

## 2016-04-07 MED ORDER — HEPARIN SOD (PORK) LOCK FLUSH 100 UNIT/ML IV SOLN
500.0000 [IU] | INTRAVENOUS | Status: AC | PRN
  Administered 2016-04-07: 500 [IU]

## 2016-04-07 NOTE — Progress Notes (Signed)
Pt stated he feels a little weak.  Silvestre Gunner, [place]

## 2016-04-07 NOTE — Progress Notes (Signed)
Patient discharged to home with instructions and prescription, advance home care nurse will meet the family at home to give the tube feeding instructions, all equipment sent with patient.

## 2016-04-07 NOTE — Progress Notes (Signed)
qPhysical Therapy Treatment Patient Details Name: Jacob [name] MRN: [id number] DOB: 1945/01/27 Today's Date: 04/07/2016    History of Present Illness Pt admitted Adenocarcinoma of stomach with distal gastrectomy,stomach drain. Also got  feeding tube.PMH:  STage 3 kidney disease, HTN.      PT Comments    Pt admitted with above diagnosis. Pt currently with functional limitations due to balance and endurance deficits. Pt was able to ambulate with min guard assist.  When pt fatigues, he loses his posture and needs cues to stand tall as well as cues for safety with RW.  Pt is progressing.   Pt will benefit from skilled PT to increase their independence and safety with mobility to allow discharge to the venue listed below.     Follow Up Recommendations  Home health PT;Supervision/Assistance - 24 hour     Equipment Recommendations  Rolling walker with 5" wheels (Needs Tall RW)    Recommendations for Other Services       Precautions / Restrictions Precautions Precautions: Fall Restrictions Weight Bearing Restrictions: No    Mobility  Bed Mobility Overal bed mobility: Needs Assistance Bed Mobility: Supine to Sit     Supine to sit: Supervision     General bed mobility comments: Took incr time.  Pt states he didnt get up over weekend and he is stiff.   Transfers Overall transfer level: Needs assistance Equipment used: Rolling walker (2 wheeled) Transfers: Sit to/from Stand Sit to Stand: Supervision            Ambulation/Gait Ambulation/Gait assistance: Min guard Ambulation Distance (Feet): 220 Feet Assistive device: Rolling walker (2 wheeled) Gait Pattern/deviations: Step-through pattern;Decreased stride length;Wide base of support   Gait velocity interpretation: Below normal speed for age/gender General Gait Details: Cues to stay close to RW as pt pushes too far at times.  Pt steady overall with RW except he needs incr assist and cues when he fatigues.     Stairs            Wheelchair Mobility    Modified Rankin (Stroke Patients Only)       Balance Overall balance assessment: Needs assistance Sitting-balance support: No upper extremity supported;Feet supported Sitting balance-Leahy Scale: Fair     Standing balance support: Bilateral upper extremity supported;During functional activity Standing balance-Leahy Scale: Poor Standing balance comment: relies on RW for support                            Cognition Arousal/Alertness: Awake/alert Behavior During Therapy: WFL for tasks assessed/performed Overall Cognitive Status: Within Functional Limits for tasks assessed                                        Exercises General Exercises - Lower Extremity Ankle Circles/Pumps: AROM;Both;10 reps;Supine Long Arc Quad: AROM;Both;10 reps;Seated    General Comments        Pertinent Vitals/Pain Pain Assessment: No/denies pain  VSS  Home Living                      Prior Function            PT Goals (current goals can now be found in the care plan section) Progress towards PT goals: Progressing toward goals    Frequency    Min 3X/week      PT Plan Current plan remains appropriate  Co-evaluation             End of Session Equipment Utilized During Treatment: Gait belt Activity Tolerance: Patient limited by fatigue Patient left: in chair;with call bell/phone within reach;with chair alarm set Nurse Communication: Mobility status PT Visit Diagnosis: Unsteadiness on feet (R26.81);Muscle weakness (generalized) (M62.81)     Time: 2409-7353 PT Time Calculation (min) (ACUTE ONLY): 13 min  Charges:  $Gait Training: 8-22 mins                    G Codes:       Jacob [name],PT Acute Rehabilitation [phone] (pager)    Jacob [name] 04/07/2016, 11:46 AM

## 2016-04-07 NOTE — Progress Notes (Signed)
[facility] Progress Note  13 Days Post-Op  Subjective: Pt feeling good. Ready to go home. No nausea, vomiting, fever or chills overnight. Tolerating diet and tube feeds.   Objective: Vital signs in last 24 hours: Temp:  [98.1 F (36.7 C)-99.5 F (37.5 C)] 98.4 F (36.9 C) (03/26 1346) Pulse Rate:  [76-88] 83 (03/26 1346) Resp:  [18-19] 18 (03/26 0402) BP: (137-150)/(76-84) 137/84 (03/26 1346) SpO2:  [97 %] 97 % (03/26 1346) Weight:  [246 lb 0.5 oz (111.6 kg)] 246 lb 0.5 oz (111.6 kg) (03/26 0003) Last BM Date: 04/06/16  Intake/Output from previous day: 03/25 0701 - 03/26 0700 In: 180  Out: 3075 [Urine:1225; Drains:1850] Intake/Output this shift: Total I/O In: 87 [P.O.:60] Out: 300 [Urine:300]  PE: Gen: Alert, NAD, pleasant, cooperative, well appearing Card: RRR, no M/G/R heard Pulm: CTA, no W/R/R, effort normal Abd: Soft, not distended,+BS, incision with staples C/D/I without drainage or surrounding erythema, JG tube in place, mild TTP in LUQ Skin: no rashes noted, warm and dry  Lab Results:   Recent Labs  04/06/16 0434  WBC 7.5  HGB 9.2*  HCT 28.6*  PLT 274   BMET  Recent Labs  04/06/16 0434  NA 133*  K 4.2  CL 102  CO2 22  GLUCOSE 103*  BUN 27*  CREATININE 2.12*  CALCIUM 9.7   PT/INR No results for input(s): LABPROT, INR in the last 72 hours. CMP     Component Value Date/Time   NA 133 (L) 04/06/2016 0434   NA 138 03/12/2016 1002   K 4.2 04/06/2016 0434   K 4.5 03/12/2016 1002   CL 102 04/06/2016 0434   CO2 22 04/06/2016 0434   CO2 21 (L) 03/12/2016 1002   GLUCOSE 103 (H) 04/06/2016 0434   GLUCOSE 96 03/12/2016 1002   BUN 27 (H) 04/06/2016 0434   BUN 20.7 03/12/2016 1002   CREATININE 2.12 (H) 04/06/2016 0434   CREATININE 1.6 (H) 03/12/2016 1002   CALCIUM 9.7 04/06/2016 0434   CALCIUM 9.9 03/12/2016 1002   PROT 6.9 03/12/2016 1002   ALBUMIN 2.2 (L) 04/04/2016 0442   ALBUMIN 2.9 (L) 03/12/2016 1002   AST 30 03/12/2016  1002   ALT 21 03/12/2016 1002   ALKPHOS 48 03/12/2016 1002   BILITOT 0.42 03/12/2016 1002   GFRNONAA 30 (L) 04/06/2016 0434   GFRAA 35 (L) 04/06/2016 0434   Lipase     Component Value Date/Time   LIPASE 49 11/27/2015 0825       Studies/Results: No results found.  Anti-infectives: Anti-infectives    Start     Dose/Rate Route Frequency Ordered Stop   03/25/16 1800  ceFAZolin (ANCEF) IVPB 2g/100 mL premix     2 g 200 mL/hr over 30 Minutes Intravenous Every 8 hours 03/25/16 1450 03/25/16 1824   03/25/16 0830  ceFAZolin (ANCEF) 3 g in dextrose 5 % 50 mL IVPB     3 g 130 mL/hr over 30 Minutes Intravenous To ShortStay Surgical 03/24/16 1205 03/25/16 1012       Assessment/Plan s/p Procedure(s): DIAGNOSTIC LAPAROSCOPY (N/A) INSERTION OF FEEDING TUBE (N/A) DISTAL GASTRECTOMY (N/A)  IR replaced tube saturday, tolerating tube feeds. Mobilize with PT. HHPT Discharge today   LOS: 13 days    Kalman Drape , [facility] 04/07/2016, 4:00 PM Pager: [phone] Consults: [phone] Mon-Fri 7:00 am-4:30 pm Sat-Sun 7:00 am-11:30 am

## 2016-04-07 NOTE — Care Management Note (Addendum)
Case Management Note  Patient Details  Name: Jacob [name] MRN: [id number] Date of Birth: Apr 15, 1945  Subjective/Objective:                    Action/Plan:   Expected Discharge Date:  04/07/16               Expected Discharge Plan:  [place]  In-House Referral:     Discharge planning Services  CM Consult  Post Acute Care Choice:  Durable Medical Equipment Choice offered to:  Patient  DME Arranged:  Tube feeding pump, Tube feeding DME Agency:  [place]:  PT, RN [facility] Agency:  [place]  Status of Service:  Completed, signed off  If discussed at [place] of Stay Meetings, dates discussed:    Additional Comments:  Marilu Favre, RN 04/07/2016, 4:09 PM

## 2016-04-08 ENCOUNTER — Encounter (HOSPITAL_COMMUNITY): Payer: Self-pay

## 2016-04-08 ENCOUNTER — Telehealth: Payer: Self-pay | Admitting: *Deleted

## 2016-04-08 ENCOUNTER — Inpatient Hospital Stay (HOSPITAL_COMMUNITY)
Admission: EM | Admit: 2016-04-08 | Discharge: 2016-04-21 | DRG: 391 | Disposition: A | Discharge status: Skilled Nursing Facility | Payer: Medicare Other | Attending: General Surgery | Admitting: General Surgery

## 2016-04-08 ENCOUNTER — Emergency Department (HOSPITAL_COMMUNITY): Payer: Medicare Other

## 2016-04-08 DIAGNOSIS — Y833 Surgical operation with formation of external stoma as the cause of abnormal reaction of the patient, or of later complication, without mention of misadventure at the time of the procedure: Secondary | ICD-10-CM | POA: Diagnosis present

## 2016-04-08 DIAGNOSIS — K910 Vomiting following gastrointestinal surgery: Principal | ICD-10-CM | POA: Diagnosis present

## 2016-04-08 DIAGNOSIS — N179 Acute kidney failure, unspecified: Secondary | ICD-10-CM | POA: Diagnosis present

## 2016-04-08 DIAGNOSIS — Z87891 Personal history of nicotine dependence: Secondary | ICD-10-CM

## 2016-04-08 DIAGNOSIS — R110 Nausea: Secondary | ICD-10-CM

## 2016-04-08 DIAGNOSIS — C169 Malignant neoplasm of stomach, unspecified: Secondary | ICD-10-CM | POA: Diagnosis present

## 2016-04-08 DIAGNOSIS — R112 Nausea with vomiting, unspecified: Secondary | ICD-10-CM | POA: Diagnosis present

## 2016-04-08 DIAGNOSIS — Z809 Family history of malignant neoplasm, unspecified: Secondary | ICD-10-CM

## 2016-04-08 DIAGNOSIS — Z9889 Other specified postprocedural states: Secondary | ICD-10-CM | POA: Diagnosis present

## 2016-04-08 DIAGNOSIS — N183 Chronic kidney disease, stage 3 (moderate): Secondary | ICD-10-CM | POA: Diagnosis present

## 2016-04-08 DIAGNOSIS — N138 Other obstructive and reflux uropathy: Secondary | ICD-10-CM | POA: Diagnosis present

## 2016-04-08 DIAGNOSIS — Z86010 Personal history of colonic polyps: Secondary | ICD-10-CM

## 2016-04-08 DIAGNOSIS — R627 Adult failure to thrive: Secondary | ICD-10-CM

## 2016-04-08 DIAGNOSIS — F419 Anxiety disorder, unspecified: Secondary | ICD-10-CM | POA: Diagnosis present

## 2016-04-08 DIAGNOSIS — K219 Gastro-esophageal reflux disease without esophagitis: Secondary | ICD-10-CM | POA: Diagnosis present

## 2016-04-08 DIAGNOSIS — K55069 Acute infarction of intestine, part and extent unspecified: Secondary | ICD-10-CM

## 2016-04-08 DIAGNOSIS — N401 Enlarged prostate with lower urinary tract symptoms: Secondary | ICD-10-CM | POA: Diagnosis present

## 2016-04-08 DIAGNOSIS — E860 Dehydration: Secondary | ICD-10-CM | POA: Diagnosis present

## 2016-04-08 DIAGNOSIS — Z931 Gastrostomy status: Secondary | ICD-10-CM

## 2016-04-08 DIAGNOSIS — Z79899 Other long term (current) drug therapy: Secondary | ICD-10-CM

## 2016-04-08 DIAGNOSIS — F329 Major depressive disorder, single episode, unspecified: Secondary | ICD-10-CM | POA: Diagnosis present

## 2016-04-08 HISTORY — DX: Nausea with vomiting, unspecified: R11.2

## 2016-04-08 HISTORY — DX: Other specified postprocedural states: Z98.890

## 2016-04-08 LAB — CBC WITH DIFFERENTIAL/PLATELET
BASOS PCT: 0 %
Basophils Absolute: 0 10*3/uL (ref 0.0–0.1)
EOS ABS: 0 10*3/uL (ref 0.0–0.7)
Eosinophils Relative: 0 %
HCT: 30.7 % — ABNORMAL LOW (ref 39.0–52.0)
HEMOGLOBIN: 10 g/dL — AB (ref 13.0–17.0)
LYMPHS ABS: 0.9 10*3/uL (ref 0.7–4.0)
Lymphocytes Relative: 9 %
MCH: 30.3 pg (ref 26.0–34.0)
MCHC: 32.6 g/dL (ref 30.0–36.0)
MCV: 93 fL (ref 78.0–100.0)
Monocytes Absolute: 1.1 10*3/uL — ABNORMAL HIGH (ref 0.1–1.0)
Monocytes Relative: 11 %
NEUTROS ABS: 7.8 10*3/uL — AB (ref 1.7–7.7)
NEUTROS PCT: 80 %
Platelets: 268 10*3/uL (ref 150–400)
RBC: 3.3 MIL/uL — AB (ref 4.22–5.81)
RDW: 17.4 % — ABNORMAL HIGH (ref 11.5–15.5)
WBC: 9.8 10*3/uL (ref 4.0–10.5)

## 2016-04-08 LAB — COMPREHENSIVE METABOLIC PANEL
ALK PHOS: 46 U/L (ref 38–126)
ALT: 24 U/L (ref 17–63)
AST: 42 U/L — AB (ref 15–41)
Albumin: 2.8 g/dL — ABNORMAL LOW (ref 3.5–5.0)
Anion gap: 10 (ref 5–15)
BUN: 39 mg/dL — AB (ref 6–20)
CALCIUM: 10.1 mg/dL (ref 8.9–10.3)
CHLORIDE: 104 mmol/L (ref 101–111)
CO2: 16 mmol/L — ABNORMAL LOW (ref 22–32)
CREATININE: 2.69 mg/dL — AB (ref 0.61–1.24)
GFR, EST AFRICAN AMERICAN: 26 mL/min — AB (ref >60)
GFR, EST NON AFRICAN AMERICAN: 22 mL/min — AB (ref >60)
Glucose, Bld: 102 mg/dL — ABNORMAL HIGH (ref 65–99)
Potassium: 4.3 mmol/L (ref 3.5–5.1)
Sodium: 130 mmol/L — ABNORMAL LOW (ref 135–145)
Total Bilirubin: 0.5 mg/dL (ref 0.3–1.2)
Total Protein: 7.2 g/dL (ref 6.5–8.1)

## 2016-04-08 LAB — LIPASE, BLOOD: Lipase: 128 U/L — ABNORMAL HIGH (ref 11–51)

## 2016-04-08 LAB — I-STAT CG4 LACTIC ACID, ED: Lactic Acid, Venous: 0.37 mmol/L — ABNORMAL LOW (ref 0.5–1.9)

## 2016-04-08 IMAGING — CT CT ABD-PELV W/O CM
2 of 4 series · 10 of 46 positions shown, 11 images · non-contrast
Comparison: PET/CT performed [DATE]

CLINICAL DATA: Acute onset of generalized abdominal pain. Initial
encounter.

EXAM:
CT ABDOMEN AND PELVIS WITHOUT CONTRAST
TECHNIQUE: Multidetector CT imaging of the abdomen and pelvis was performed
following the standard protocol without IV contrast.

[Series 201: routine, idose (2) · axial · 0.78mm/px · z∈[+2,+407]mm · 7 of 97 slices shown, 8 images]
[im 8/97  soft-tissue]
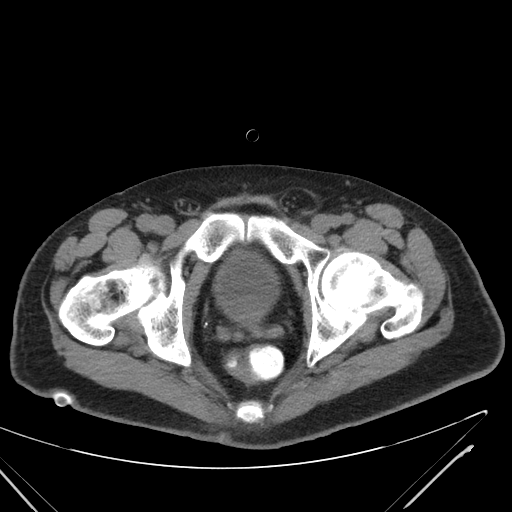
[im 8/97  bone]
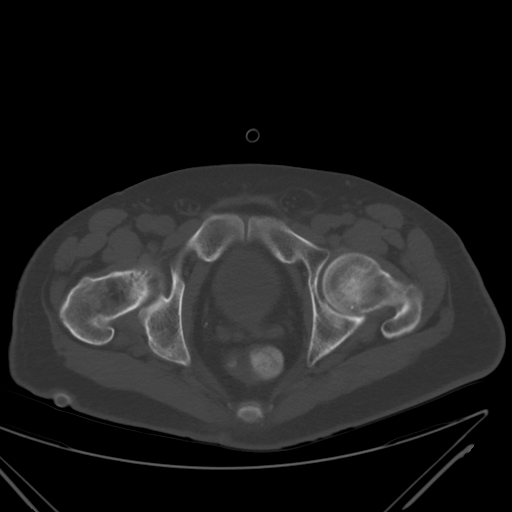
[im 23/97  soft-tissue]
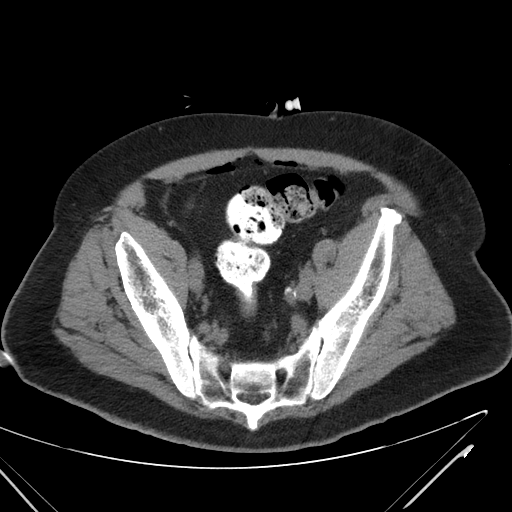
[im 34/97  soft-tissue]
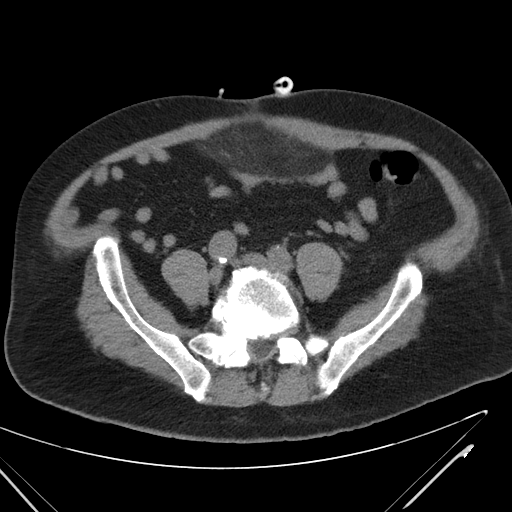
[im 49/97  soft-tissue]
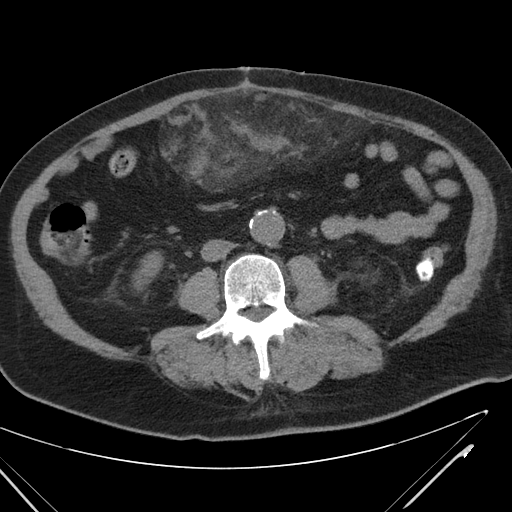
[im 63/97  soft-tissue]
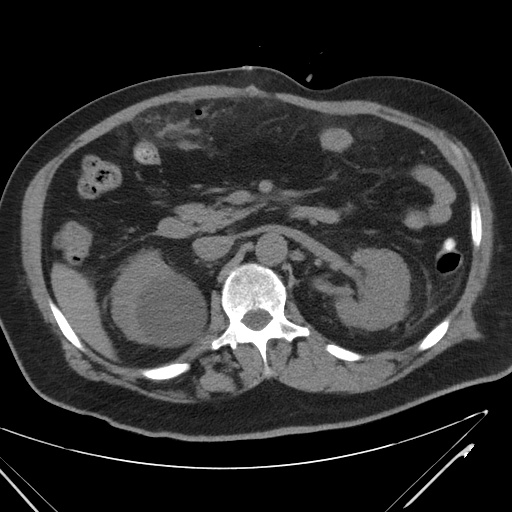
[im 74/97  soft-tissue]
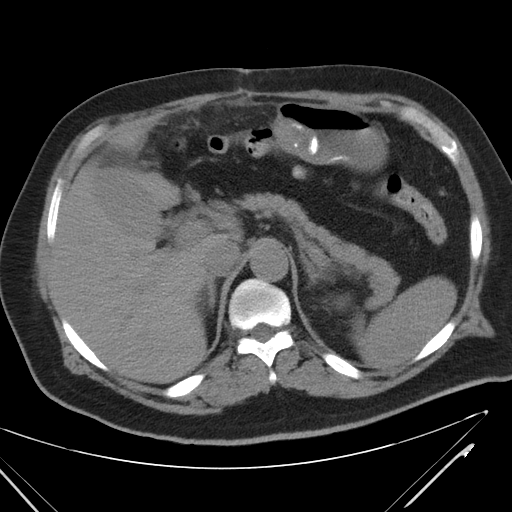
[im 89/97  soft-tissue]
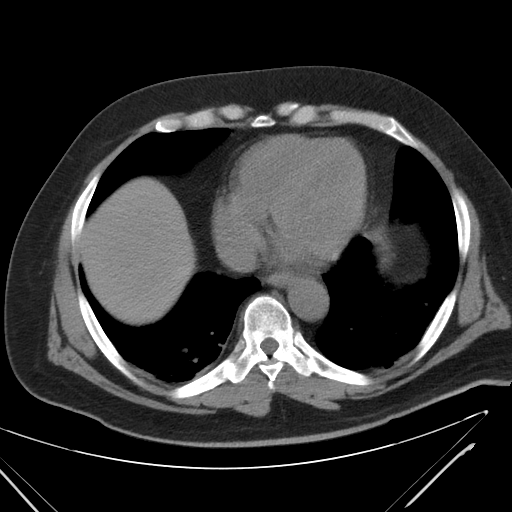

[Series 203: coronals, idose (2) · coronal · 0.45mm/px · 3 of 119 slices shown]
[im 40/119  soft-tissue]
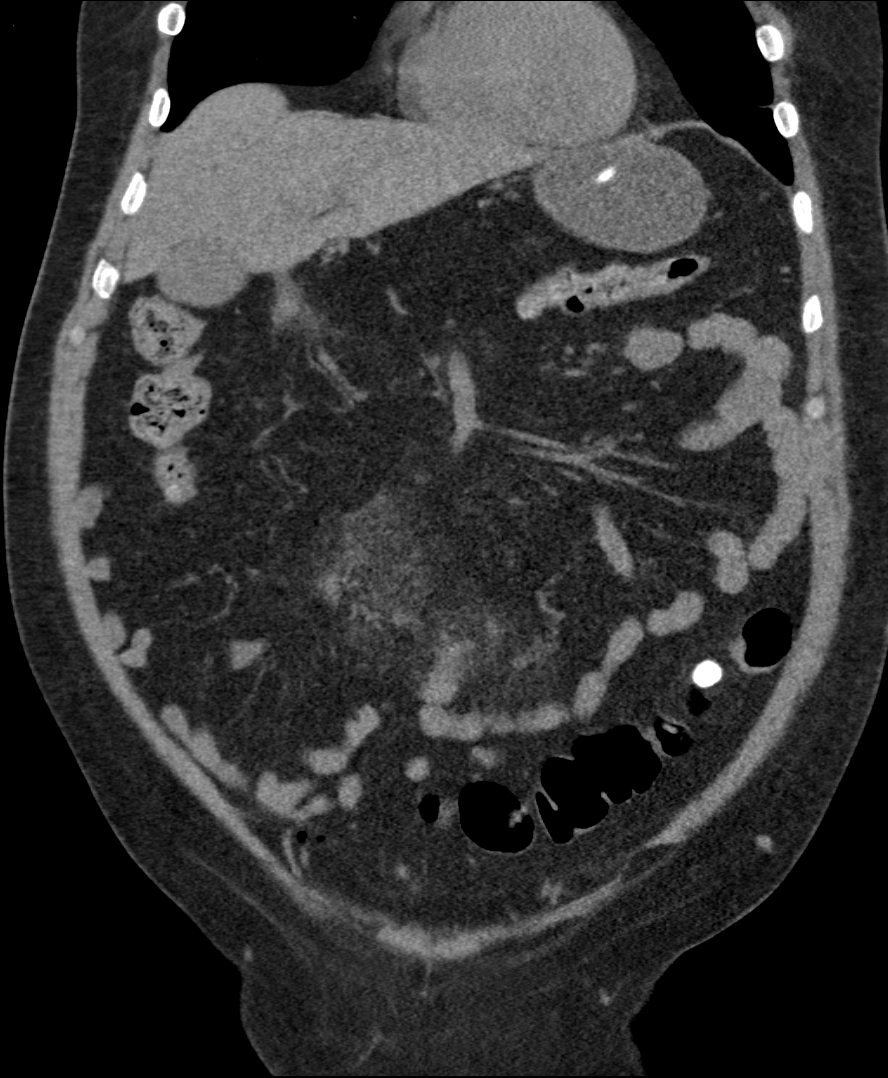
[im 53/119  soft-tissue]
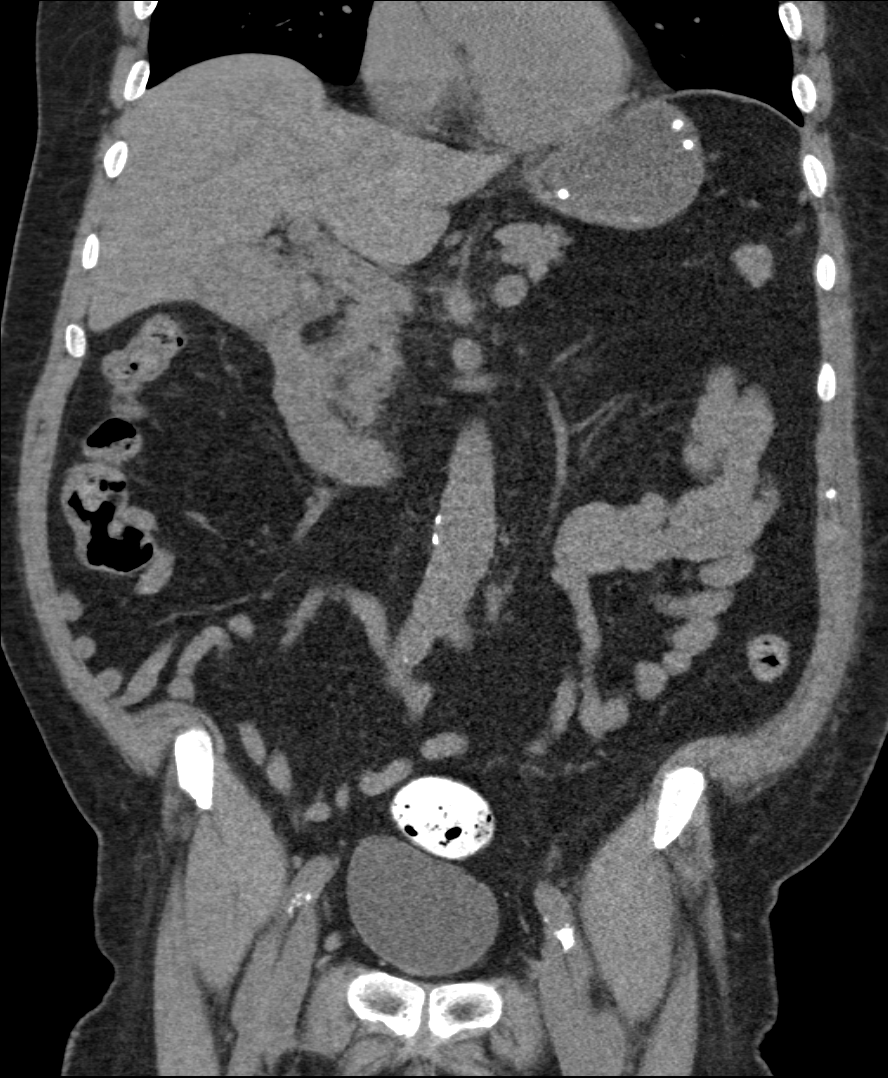
[im 66/119  soft-tissue]
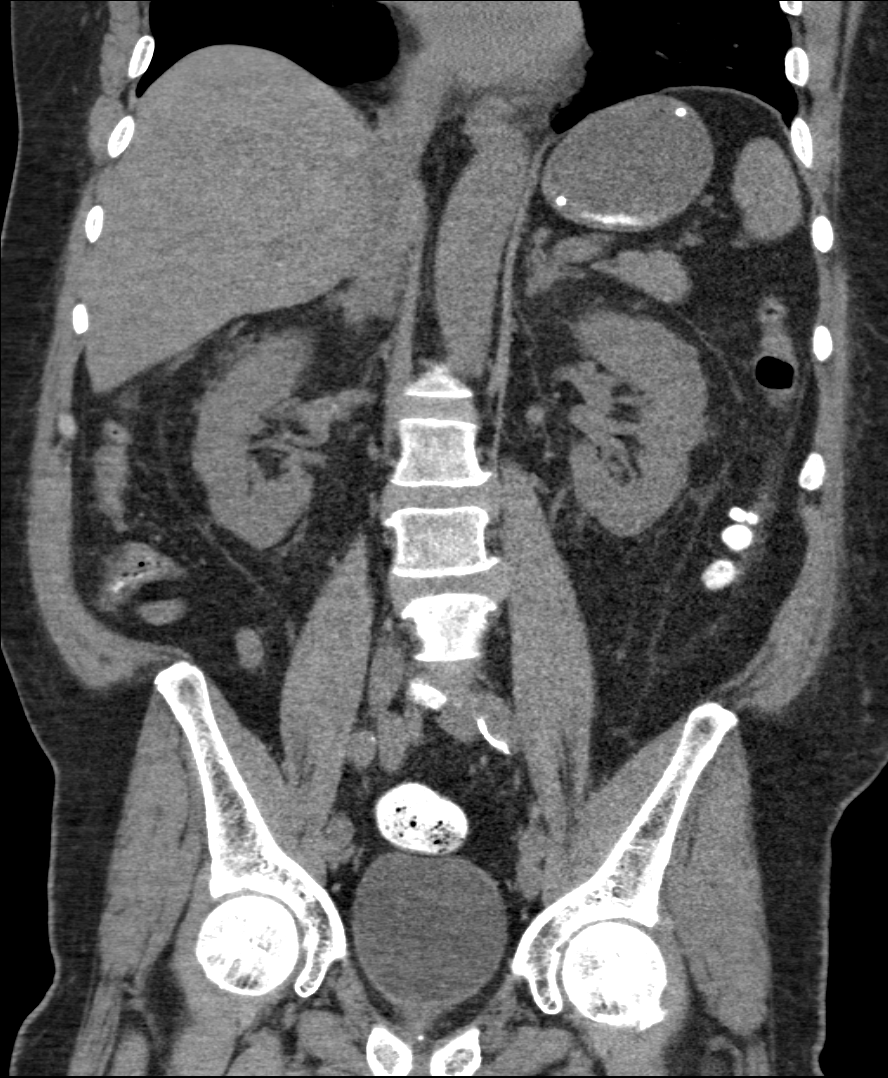

[10 of 46 positions shown; findings below may reference images not displayed]

FINDINGS: Lower chest: Mild bibasilar atelectasis is noted. Mild calcification
is seen at the mitral valve.

Hepatobiliary: The liver is unremarkable in appearance. The
gallbladder is unremarkable in appearance. The common bile duct
remains normal in caliber.

Pancreas: The pancreas is within normal limits.

Spleen: The spleen is unremarkable in appearance.

Adrenals/Urinary Tract: The adrenal glands are unremarkable in
appearance. The kidneys are within normal limits. There is no
evidence of hydronephrosis. No renal or ureteral stones are
identified. No perinephric stranding is seen.

Stomach/Bowel: The patient's G-tube is noted coiled within the
stomach. The patient is status post partial gastrectomy. The distal
duodenum is unremarkable in appearance. No focal small bowel
abnormalities are seen. The small bowel is largely decompressed.

The appendix is normal in caliber, without evidence of appendicitis.
Scattered diverticulosis is noted along the descending and proximal
sigmoid colon, without evidence of diverticulitis.

Vascular/Lymphatic: Scattered calcification is seen along the
abdominal aorta and its branches. The abdominal aorta is otherwise
grossly unremarkable. The inferior vena cava is grossly
unremarkable. No retroperitoneal lymphadenopathy is seen. No pelvic
sidewall lymphadenopathy is identified.

Reproductive: The bladder is mildly distended and contains trace
air, raising question for recent Foley catheter placement. The
prostate is borderline enlarged, measuring 4.9 cm in transverse
dimension.

Other: A large 22.0 x 8.0 x 15.7 cm area of edema is noted within
the omentum, with associated fluid and scattered foci of air,
concerning for a very large acute omental infarct.

Musculoskeletal: No acute osseous abnormalities are identified. The
visualized musculature is unremarkable in appearance.
IMPRESSION: 1. Large 22.0 x 8.0 x 15.7 cm area of edema within the omentum, with
associated fluid and scattered foci of air, concerning for a very
large acute evolving omental infarct.
2. Status post partial gastrectomy. Remaining stomach is grossly
unremarkable. G-tube is noted coiled within the stomach.
3. Borderline enlarged prostate.
4. Scattered aortic atherosclerosis.
5. Mild bibasilar atelectasis.
6. Mild calcification at the mitral valve.
7. Scattered diverticulosis along the descending and proximal
sigmoid colon, without evidence of diverticulitis.

## 2016-04-08 MED ORDER — SODIUM CHLORIDE 0.9 % IV BOLUS (SEPSIS)
1000.0000 mL | Freq: Once | INTRAVENOUS | Status: AC
  Administered 2016-04-08: 1000 mL via INTRAVENOUS

## 2016-04-08 NOTE — ED Triage Notes (Signed)
1/2 of stomach removed for Stomach CA and was d/c'd yesterday no issues no pain no s/s of infection family and pt are concerned due to pt being cared for by wife who is legally blind pt can eat PO but pt states that he is not hungry pt here for eval of failure of to thrive

## 2016-04-08 NOTE — ED Provider Notes (Signed)
[facility] DEPT Provider Note   CSN: [id number] Arrival date & time: 04/08/16  1911     History   Chief Complaint Chief Complaint  Patient presents with  . Failure To Thrive    pt sent in for failure to thrive after being discharged a few days ago post having surgery on abdomen     HPI Viliami Whittlesey is a 71 y.o. male.  HPI  71 year old male with history of adenocarcinoma of the gastric antrum, recently hospitalized with general surgery for distal gastrectomy with Billroth II reconstruction and GJ tube insertion, who presents to the ED with vomiting, abdominal pain, and diarrhea. Patient states that he had an episode of vomiting yesterday when discharged from the hospital, and has not vomited since, but states that "I haven't really eaten anything since I've gotten home because I don't have an appetite." States that he's had 2 episodes of semisolid diarrhea daily since the surgery that does not seem to be getting better. Reports generalized abdominal pain that is improving but is still distressing to him.  Both the patient and his family at bedside expressed concern that they're unable to adequately care for him at home. They state that they thought that they would be able to administer tube feeds and help him with his activities of daily living, but after getting home have realized that the patient requires more care than they are able to safely provide.    Past Medical History:  Diagnosis Date  . Anemia    hx iron deficiency  . Anxiety   . Arthritis    gout  . Back pain   . BPH with obstruction/lower urinary tract symptoms   . Chronic kidney disease    "they said I do"  . Constipation   . Depression   . ED (erectile dysfunction)   . Epigastric pain   . GERD (gastroesophageal reflux disease)   . Heartburn   . History of blood transfusion   . History of radiation therapy 11/03/11-12/29/11   prostate  . Hypercholesterolemia   . Hypertension   . Night sweats   .  Prostate cancer ([place]) 08/14/11   Adenocarcinoma,gleason:3+3=6,& 3+4=7,PSA=5.66  . PUD (peptic ulcer disease)   . Sleep apnea    does not use Cpap but does use O2 @ 2l   . Stomach cancer ([place])   . Ulcer ([place])    peptic ulcer hx    Patient Active Problem List   Diagnosis Date Noted  . 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 Genetic testing 12/19/2015  . Stomach cancer ([place])   . Gastric adenocarcinoma ([place])   . Gastric mass   . GI bleed 10/26/2015  . Gastrointestinal hemorrhage   . PUD (peptic ulcer disease)   . 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 Nocturnal hypoxemia 11/04/2013  . Noncompliance with treatment 09/02/2013  . Prostate cancer ([place]) 08/14/2011  . Iron deficiency anemia 03/19/2007  . DEPRESSION 03/19/2007  . Essential hypertension 03/19/2007  . NEOPLASM, BENIGN, STOMACH 11/23/2006  . POLYP, COLON 11/23/2006  . INTERNAL HEMORRHOIDS 11/23/2006  . GASTRITIS 11/23/2006    Past Surgical History:  Procedure Laterality Date  . colon polyps bx  11/24/06   colon,transverse and rectosigmoid polyps:tubular adenomas and hyperplastic polyps,no high grade dysplasia or malignancy   . duodenal bx  11/24/06   benign  . ESOPHAGOGASTRODUODENOSCOPY N/A 10/27/2015   Procedure: ESOPHAGOGASTRODUODENOSCOPY (EGD);  Surgeon: [name], MD;  Location: [facility];  Service: Endoscopy;  Laterality: N/A;  .  EUS N/A 11/08/2015   Procedure: UPPER ENDOSCOPIC ULTRASOUND (EUS) RADIAL;  Surgeon: [name], MD;  Location: [facility];  Service: Endoscopy;  Laterality: N/A;  . GASTRECTOMY N/A 03/25/2016   Procedure: DISTAL GASTRECTOMY;  Surgeon: [name], MD;  Location: [facility];  Service: General;  Laterality: N/A;  . gastric bx  11/24/06   chronic active gastritis,with metaplasia and focal changaes of xanthelasma  . GASTROSTOMY N/A 03/25/2016   Procedure: INSERTION OF FEEDING TUBE;  Surgeon: [name], MD;  Location: [facility];  Service: General;   Laterality: N/A;  . INSERTION PROSTATE RADIATION SEED  12-29-11  . IR GENERIC HISTORICAL  04/01/2016   IR GJ TUBE CHANGE 04/01/2016 Markus Daft, MD [facility]  . IR GENERIC HISTORICAL  04/05/2016   IR GJ TUBE CHANGE 04/05/2016 Markus Daft, MD [facility]  . LAPAROSCOPIC GASTRECTOMY  03/25/2016   Diagnostic laparoscopy, distal gastrectomy with Billroth 2 reconstruction and gastrojejunostomy tube  . LAPAROSCOPY N/A 03/25/2016   Procedure: DIAGNOSTIC LAPAROSCOPY;  Surgeon: [name], MD;  Location: [facility];  Service: General;  Laterality: N/A;  . PORTACATH PLACEMENT Left 12/04/2015   Procedure: INSERTION PORT-A-CATH;  Surgeon: [name], MD;  Location: [facility];  Service: General;  Laterality: Left;  . PROSTATE BIOPSY  08/14/11   Adenocarcinoma/volume=58.51cc,gleason=3+3=6 & 3+4=7  . TONSILLECTOMY     71 years old       Home Medications    Prior to Admission medications   Medication Sig Start Date End Date Taking? Authorizing Provider  acetaminophen (TYLENOL) 500 MG tablet Take 1,000 mg by mouth daily as needed for moderate pain or headache.   Yes Historical Provider, MD  allopurinol (ZYLOPRIM) 300 MG tablet Take 300 mg by mouth daily.  05/10/15  Yes Historical Provider, MD  buPROPion (WELLBUTRIN XL) 300 MG 24 hr tablet Take 300 mg by mouth daily.  05/10/15  Yes Historical Provider, MD  calcium carbonate (TUMS - DOSED IN MG ELEMENTAL CALCIUM) 500 MG chewable tablet Chew 3 tablets by mouth 2 (two) times daily as needed for indigestion or heartburn.   Yes Historical Provider, MD  diazepam (VALIUM) 5 MG tablet Take 5 mg by mouth at bedtime as needed (sleep).    Yes Historical Provider, MD  doxylamine, Sleep, (GNP SLEEP AID) 25 MG tablet Take 25-50 mg by mouth at bedtime as needed for sleep.   Yes Historical Provider, MD  furosemide (LASIX) 80 MG tablet Take 40 mg by mouth every other day.    Yes Historical Provider, MD  gabapentin (NEURONTIN) 800 MG tablet Take 800 mg by mouth at bedtime.    Yes  Historical Provider, MD  lidocaine-prilocaine (EMLA) cream Apply 1 application topically as needed. Apply to port before chemotherapy. Patient taking differently: Apply 1 application topically daily as needed (prior to port access). Apply to port before chemotherapy. 11/22/15  Yes Wyatt Portela, MD  Melatonin 10 MG TABS Take 1 tablet by mouth at bedtime as needed (sleep).    Yes Historical Provider, MD  Nebivolol HCl (BYSTOLIC) 20 MG TABS Take 20 mg by mouth daily.    Yes Historical Provider, MD  Nutritional Supplements (FEEDING SUPPLEMENT, JEVITY 1.5 CAL/FIBER,) LIQD Place 1,000 mLs into feeding tube continuous. 60 mL/hr over 18 hours. 04/07/16  Yes Jessica L Focht, PA  oxyCODONE (OXY IR/ROXICODONE) 5 MG immediate release tablet Take 1 tablet (5 mg total) by mouth every 4 (four) hours as needed for moderate pain. 04/07/16  Yes Kalman Drape, PA  prochlorperazine (COMPAZINE) 10  MG tablet Take 1 tablet (10 mg total) by mouth every 6 (six) hours as needed for nausea or vomiting. 11/22/15  Yes Wyatt Portela, MD  tamsulosin (FLOMAX) 0.4 MG CAPS capsule TAKE 1 CAPSULE BY MOUTH DAILY 06/19/14  Yes Arloa Koh, MD  traZODone (DESYREL) 50 MG tablet Take 50 mg by mouth at bedtime.  05/10/15  Yes Historical Provider, MD  Verapamil HCl CR 300 MG CP24 Take 300 mg by mouth at bedtime.  04/01/15  Yes Historical Provider, MD  oxyCODONE (OXY IR/ROXICODONE) 5 MG immediate release tablet Take 1-2 tablets (5-10 mg total) by mouth every 6 (six) hours as needed for moderate pain, severe pain or breakthrough pain. Patient not taking: Reported on 03/14/2016 12/04/15   [name], MD  OXYGEN Inhale 2 L into the lungs at bedtime as needed (for shortness of breath).    Historical Provider, MD    Family History Family History  Problem Relation Age of Onset  . Cancer Mother     NOS  . Alcohol abuse Father   . Alcohol abuse Brother 50  . Cancer Paternal Aunt     NOS  . Colon cancer Neg Hx     Social History Social  History  Substance Use Topics  . Smoking status: Former Smoker    Packs/day: 1.50    Years: 30.00    Types: Cigarettes    Quit date: 04/13/2000  . Smokeless tobacco: Never Used  . Alcohol use 0.6 oz/week    1 Cans of beer per week     Comment: 1-2 beers every other night     Allergies   No known allergies   Review of Systems Review of Systems  Constitutional: Positive for appetite change. Negative for chills and fever.  HENT: Negative for congestion.   Eyes: Negative for visual disturbance.  Respiratory: Negative for cough and shortness of breath.   Cardiovascular: Negative for chest pain.  Gastrointestinal: Positive for abdominal pain, diarrhea, nausea and vomiting. Negative for abdominal distention, blood in stool and constipation.  Genitourinary: Negative for difficulty urinating and hematuria.  Musculoskeletal: Negative for arthralgias and back pain.  Skin: Negative for color change and rash.  Neurological: Negative for dizziness, light-headedness and headaches.  Psychiatric/Behavioral: Negative for agitation, behavioral problems and confusion.     Physical Exam Updated Vital Signs BP (!) 142/81 (BP Location: Right Arm)   Pulse 75   Resp 16   Ht 6' (1.829 m)   Wt 111.6 kg   SpO2 95%   BMI 33.36 kg/m   Vitals:   04/08/16 2225 04/08/16 2230 04/08/16 2300 04/08/16 2350  BP: 140/86 (!) 141/84 136/82 (!) 142/81  Pulse:  80 80 75  Resp: 16 16  16   SpO2:  95% 97% 95%  Weight:      Height:         Physical Exam  Constitutional: He is oriented to person, place, and time. He appears well-developed and well-nourished.  HENT:  Head: Normocephalic and atraumatic.  Mouth/Throat: Oropharynx is clear and moist.  Eyes: Conjunctivae are normal.  Neck: Normal range of motion. Neck supple.  Cardiovascular: Normal rate, regular rhythm, normal heart sounds and intact distal pulses.   No murmur heard. Pulmonary/Chest: Effort normal and breath sounds normal. No respiratory  distress.  Abdominal: Soft. Bowel sounds are normal. He exhibits no distension. There is tenderness (generalized). There is no guarding.  Midline surgical incision is healing well, no surrounding erythema or drainage. GJ tube with green/yellow drainage in the  bag  Musculoskeletal: He exhibits no edema.  Neurological: He is alert and oriented to person, place, and time. He exhibits normal muscle tone.  Skin: Skin is warm and dry.  Psychiatric: He has a normal mood and affect.  Nursing note and vitals reviewed.    ED Treatments / Results  Labs (all labs ordered are listed, but only abnormal results are displayed) Labs Reviewed  CBC WITH DIFFERENTIAL/PLATELET - Abnormal; Notable for the following:       Result Value   RBC 3.30 (*)    Hemoglobin 10.0 (*)    HCT 30.7 (*)    RDW 17.4 (*)    Neutro Abs 7.8 (*)    Monocytes Absolute 1.1 (*)    All other components within normal limits  COMPREHENSIVE METABOLIC PANEL - Abnormal; Notable for the following:    Sodium 130 (*)    CO2 16 (*)    Glucose, Bld 102 (*)    BUN 39 (*)    Creatinine, Ser 2.69 (*)    Albumin 2.8 (*)    AST 42 (*)    GFR calc non Af Amer 22 (*)    GFR calc Af Amer 26 (*)    All other components within normal limits  LIPASE, BLOOD - Abnormal; Notable for the following:    Lipase 128 (*)    All other components within normal limits  I-STAT CG4 LACTIC ACID, ED - Abnormal; Notable for the following:    Lactic Acid, Venous 0.37 (*)    All other components within normal limits    EKG  EKG Interpretation None       Radiology Ct Abdomen Pelvis Wo Contrast  Result Date: 04/08/2016 CLINICAL DATA:  Acute onset of generalized abdominal pain. Initial encounter. EXAM: CT ABDOMEN AND PELVIS WITHOUT CONTRAST TECHNIQUE: Multidetector CT imaging of the abdomen and pelvis was performed following the standard protocol without IV contrast. COMPARISON:  PET/CT performed 02/07/2016 FINDINGS: Lower chest: Mild bibasilar  atelectasis is noted. Mild calcification is seen at the mitral valve. Hepatobiliary: The liver is unremarkable in appearance. The gallbladder is unremarkable in appearance. The common bile duct remains normal in caliber. Pancreas: The pancreas is within normal limits. Spleen: The spleen is unremarkable in appearance. Adrenals/Urinary Tract: The adrenal glands are unremarkable in appearance. The kidneys are within normal limits. There is no evidence of hydronephrosis. No renal or ureteral stones are identified. No perinephric stranding is seen. Stomach/Bowel: The patient's G-tube is noted coiled within the stomach. The patient is status post partial gastrectomy. The distal duodenum is unremarkable in appearance. No focal small bowel abnormalities are seen. The small bowel is largely decompressed. The appendix is normal in caliber, without evidence of appendicitis. Scattered diverticulosis is noted along the descending and proximal sigmoid colon, without evidence of diverticulitis. Vascular/Lymphatic: Scattered calcification is seen along the abdominal aorta and its branches. The abdominal aorta is otherwise grossly unremarkable. The inferior vena cava is grossly unremarkable. No retroperitoneal lymphadenopathy is seen. No pelvic sidewall lymphadenopathy is identified. Reproductive: The bladder is mildly distended and contains trace air, raising question for recent Foley catheter placement. The prostate is borderline enlarged, measuring 4.9 cm in transverse dimension. 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 Musculoskeletal: No acute osseous abnormalities are identified. The visualized musculature is unremarkable in appearance. IMPRESSION: 1. 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 2. Status post partial gastrectomy. Remaining stomach is grossly unremarkable. G-tube is noted coiled within the stomach. 3. Borderline enlarged prostate. 4. Scattered aortic atherosclerosis. 5. Mild bibasilar atelectasis. 6. Mild calcification at the mitral valve. 7. Scattered diverticulosis along the descending and proximal sigmoid colon, without evidence of diverticulitis. Electronically Signed   By: Garald Balding M.D.   On: 04/08/2016 23:48    Procedures Procedures (including critical care time)  Medications Ordered in ED Medications  sodium chloride 0.9 % bolus 1,000 mL (0 mLs Intravenous Stopped 04/08/16 2351)  sodium chloride 0.9 % bolus 1,000 mL (1,000 mLs Intravenous New Bag/Given 04/08/16 2351)     Initial Impression / Assessment and Plan / ED Course  I have reviewed the triage vital signs and the nursing notes.  Pertinent labs & imaging results that were available during my care of the patient were reviewed by me and considered in my medical decision making (see chart for details).     Hemodynamically stable. Patient endorses generalized abdominal tenderness, but states that this is improving since the surgery. He continues to endorse diarrhea and vomiting. CT abdomen pelvis without contrast obtained, that shows a very large evolving omental infarct. CBC with no leukocytosis, hemoglobin improved from recent baseline, and no lactic acidosis. BMP reveals steadily increasing creatinine, with creatinine of 2.69 today from 2.16 at discharge. Suspect that the patient is dry as he reports poor oral intake. 2 L of normal saline given.  Findings discussed with general surgery, who will be consulting and providing recommendations.   Care of patient overseen by my attending, Dr. Jeneen Rinks.  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

## 2016-04-08 NOTE — Telephone Encounter (Signed)
This is related to his surgery and they need to contact Dr. Barry Dienes ASAP or take him to the ED.

## 2016-04-08 NOTE — Telephone Encounter (Signed)
"  This is  Warden/ranger with [facility] [phone]).  Calling to get help for home placement for this patient.  Was discharged home yesterday after laparoscopic surgery and should not be home.   Symptoms are extreme weakness, has a new G-Tube that's draining, having pain, nausea, vomiting and the family can't take care of him.  Since cancer diagnosis he has not seen primary provider.  I can ask surgeon but they surgeons don't really do this.

## 2016-04-08 NOTE — Telephone Encounter (Signed)
Called Arbie Cookey RN, provided these orders.

## 2016-04-09 ENCOUNTER — Encounter (HOSPITAL_COMMUNITY): Payer: Self-pay | Admitting: Surgery

## 2016-04-09 DIAGNOSIS — C169 Malignant neoplasm of stomach, unspecified: Secondary | ICD-10-CM | POA: Diagnosis present

## 2016-04-09 DIAGNOSIS — Z9889 Other specified postprocedural states: Secondary | ICD-10-CM | POA: Diagnosis present

## 2016-04-09 DIAGNOSIS — F329 Major depressive disorder, single episode, unspecified: Secondary | ICD-10-CM | POA: Diagnosis present

## 2016-04-09 DIAGNOSIS — N138 Other obstructive and reflux uropathy: Secondary | ICD-10-CM | POA: Diagnosis present

## 2016-04-09 DIAGNOSIS — K55069 Acute infarction of intestine, part and extent unspecified: Secondary | ICD-10-CM | POA: Diagnosis present

## 2016-04-09 DIAGNOSIS — E860 Dehydration: Secondary | ICD-10-CM | POA: Diagnosis present

## 2016-04-09 DIAGNOSIS — R627 Adult failure to thrive: Secondary | ICD-10-CM | POA: Diagnosis present

## 2016-04-09 DIAGNOSIS — R112 Nausea with vomiting, unspecified: Secondary | ICD-10-CM | POA: Diagnosis present

## 2016-04-09 DIAGNOSIS — K219 Gastro-esophageal reflux disease without esophagitis: Secondary | ICD-10-CM | POA: Diagnosis present

## 2016-04-09 DIAGNOSIS — Y833 Surgical operation with formation of external stoma as the cause of abnormal reaction of the patient, or of later complication, without mention of misadventure at the time of the procedure: Secondary | ICD-10-CM | POA: Diagnosis present

## 2016-04-09 DIAGNOSIS — F419 Anxiety disorder, unspecified: Secondary | ICD-10-CM | POA: Diagnosis present

## 2016-04-09 DIAGNOSIS — Z86010 Personal history of colonic polyps: Secondary | ICD-10-CM | POA: Diagnosis not present

## 2016-04-09 DIAGNOSIS — Z79899 Other long term (current) drug therapy: Secondary | ICD-10-CM | POA: Diagnosis not present

## 2016-04-09 DIAGNOSIS — K910 Vomiting following gastrointestinal surgery: Secondary | ICD-10-CM | POA: Diagnosis present

## 2016-04-09 DIAGNOSIS — N179 Acute kidney failure, unspecified: Secondary | ICD-10-CM | POA: Diagnosis present

## 2016-04-09 DIAGNOSIS — Z87891 Personal history of nicotine dependence: Secondary | ICD-10-CM | POA: Diagnosis not present

## 2016-04-09 DIAGNOSIS — Z931 Gastrostomy status: Secondary | ICD-10-CM | POA: Diagnosis not present

## 2016-04-09 DIAGNOSIS — N183 Chronic kidney disease, stage 3 (moderate): Secondary | ICD-10-CM | POA: Diagnosis present

## 2016-04-09 DIAGNOSIS — N401 Enlarged prostate with lower urinary tract symptoms: Secondary | ICD-10-CM | POA: Diagnosis present

## 2016-04-09 DIAGNOSIS — Z809 Family history of malignant neoplasm, unspecified: Secondary | ICD-10-CM | POA: Diagnosis not present

## 2016-04-09 HISTORY — DX: Other specified postprocedural states: Z98.890

## 2016-04-09 HISTORY — DX: Other specified postprocedural states: R11.2

## 2016-04-09 LAB — CBC
HEMATOCRIT: 28.3 % — AB (ref 39.0–52.0)
HEMOGLOBIN: 9.6 g/dL — AB (ref 13.0–17.0)
MCH: 31.4 pg (ref 26.0–34.0)
MCHC: 33.9 g/dL (ref 30.0–36.0)
MCV: 92.5 fL (ref 78.0–100.0)
Platelets: 247 10*3/uL (ref 150–400)
RBC: 3.06 MIL/uL — ABNORMAL LOW (ref 4.22–5.81)
RDW: 17.8 % — ABNORMAL HIGH (ref 11.5–15.5)
WBC: 9.3 10*3/uL (ref 4.0–10.5)

## 2016-04-09 LAB — CREATININE, SERUM
Creatinine, Ser: 2.48 mg/dL — ABNORMAL HIGH (ref 0.61–1.24)
GFR calc Af Amer: 29 mL/min — ABNORMAL LOW (ref >60)
GFR calc non Af Amer: 25 mL/min — ABNORMAL LOW (ref >60)

## 2016-04-09 MED ORDER — ALLOPURINOL 300 MG PO TABS
300.0000 mg | ORAL_TABLET | Freq: Every day | ORAL | Status: DC
  Administered 2016-04-09 – 2016-04-21 (×13): 300 mg via ORAL
  Filled 2016-04-09 (×13): qty 1

## 2016-04-09 MED ORDER — VERAPAMIL HCL ER 180 MG PO TBCR
300.0000 mg | EXTENDED_RELEASE_TABLET | Freq: Every day | ORAL | Status: DC
  Administered 2016-04-09 – 2016-04-20 (×12): 300 mg via ORAL
  Filled 2016-04-09 (×12): qty 1

## 2016-04-09 MED ORDER — OSMOLITE 1.5 CAL PO LIQD
1000.0000 mL | ORAL | Status: DC
  Administered 2016-04-09 – 2016-04-17 (×9): 1000 mL
  Filled 2016-04-09 (×15): qty 1000

## 2016-04-09 MED ORDER — ONDANSETRON 4 MG PO TBDP
4.0000 mg | ORAL_TABLET | Freq: Four times a day (QID) | ORAL | Status: DC | PRN
  Administered 2016-04-13 – 2016-04-15 (×3): 4 mg via ORAL
  Filled 2016-04-09 (×3): qty 1

## 2016-04-09 MED ORDER — VERAPAMIL HCL ER 300 MG PO CP24: 300.0000 mg | ORAL_CAPSULE | Freq: Every day | ORAL | Status: DC

## 2016-04-09 MED ORDER — GABAPENTIN 400 MG PO CAPS
800.0000 mg | ORAL_CAPSULE | Freq: Every day | ORAL | Status: DC
  Administered 2016-04-09 – 2016-04-20 (×10): 800 mg via ORAL
  Filled 2016-04-09 (×13): qty 2

## 2016-04-09 MED ORDER — ONDANSETRON HCL 4 MG/2ML IJ SOLN
4.0000 mg | Freq: Four times a day (QID) | INTRAMUSCULAR | Status: DC | PRN
  Administered 2016-04-12 – 2016-04-15 (×3): 4 mg via INTRAVENOUS
  Filled 2016-04-09 (×3): qty 2

## 2016-04-09 MED ORDER — DIPHENHYDRAMINE HCL 50 MG/ML IJ SOLN
12.5000 mg | Freq: Four times a day (QID) | INTRAMUSCULAR | Status: DC | PRN
  Administered 2016-04-14: 12.5 mg via INTRAVENOUS
  Filled 2016-04-09: qty 1

## 2016-04-09 MED ORDER — NEBIVOLOL HCL 10 MG PO TABS
20.0000 mg | ORAL_TABLET | Freq: Every day | ORAL | Status: DC
  Administered 2016-04-09 – 2016-04-21 (×13): 20 mg via ORAL
  Filled 2016-04-09 (×13): qty 2

## 2016-04-09 MED ORDER — BUPROPION HCL ER (XL) 300 MG PO TB24
300.0000 mg | ORAL_TABLET | Freq: Every day | ORAL | Status: DC
  Administered 2016-04-09 – 2016-04-21 (×12): 300 mg via ORAL
  Filled 2016-04-09 (×13): qty 1

## 2016-04-09 MED ORDER — DIPHENHYDRAMINE HCL 12.5 MG/5ML PO ELIX
12.5000 mg | ORAL_SOLUTION | Freq: Four times a day (QID) | ORAL | Status: DC | PRN
  Filled 2016-04-09: qty 5

## 2016-04-09 MED ORDER — ENOXAPARIN SODIUM 40 MG/0.4ML ~~LOC~~ SOLN
40.0000 mg | SUBCUTANEOUS | Status: DC
  Administered 2016-04-09 – 2016-04-15 (×7): 40 mg via SUBCUTANEOUS
  Filled 2016-04-09 (×7): qty 0.4

## 2016-04-09 MED ORDER — MORPHINE SULFATE (PF) 4 MG/ML IV SOLN
2.0000 mg | INTRAVENOUS | Status: DC | PRN
  Administered 2016-04-09 – 2016-04-10 (×5): 2 mg via INTRAVENOUS
  Filled 2016-04-09 (×4): qty 1

## 2016-04-09 MED ORDER — DIAZEPAM 5 MG PO TABS: 5.0000 mg | ORAL_TABLET | Freq: Every evening | ORAL | Status: DC | PRN

## 2016-04-09 MED ORDER — TRAZODONE HCL 50 MG PO TABS
50.0000 mg | ORAL_TABLET | Freq: Every day | ORAL | Status: DC
  Administered 2016-04-09 – 2016-04-20 (×11): 50 mg via ORAL
  Filled 2016-04-09 (×12): qty 1

## 2016-04-09 MED ORDER — POTASSIUM CHLORIDE IN NACL 20-0.9 MEQ/L-% IV SOLN
INTRAVENOUS | Status: DC
  Administered 2016-04-09 – 2016-04-10 (×5): via INTRAVENOUS
  Filled 2016-04-09 (×5): qty 1000

## 2016-04-09 MED ORDER — PRO-STAT SUGAR FREE PO LIQD
30.0000 mL | Freq: Two times a day (BID) | ORAL | Status: DC
  Administered 2016-04-09 – 2016-04-19 (×18): 30 mL
  Filled 2016-04-09 (×20): qty 30

## 2016-04-09 MED ORDER — OXYCODONE HCL 5 MG PO TABS
5.0000 mg | ORAL_TABLET | ORAL | Status: DC | PRN
  Administered 2016-04-09 – 2016-04-18 (×7): 5 mg via ORAL
  Filled 2016-04-09 (×7): qty 1

## 2016-04-09 MED ORDER — PIVOT 1.5 CAL PO LIQD
1000.0000 mL | ORAL | Status: DC
  Administered 2016-04-09: 1000 mL
  Filled 2016-04-09 (×2): qty 1000

## 2016-04-09 MED ORDER — TAMSULOSIN HCL 0.4 MG PO CAPS
0.4000 mg | ORAL_CAPSULE | Freq: Every day | ORAL | Status: DC
Start: 2016-04-09 — End: 2016-04-21
  Administered 2016-04-09 – 2016-04-21 (×13): 0.4 mg via ORAL
  Filled 2016-04-09 (×13): qty 1

## 2016-04-09 NOTE — ED Provider Notes (Signed)
Pt seen and examined. D/W Dr. Adela Glimpse. Pt with recent admit for distal gastric resection. GJ tube placed. DC'd  Yesterday. Family has had difficulty "caring for patient" at home. Vomited yesterday after Dc. Has drainage from g tube site. Worsening AP today.  W/U shows slight SKI above baseline. CT with + Omental infarct. Discussed with Dr. Gershon Crane, Pt will be evaluated IN ER.   Tanna Furry, MD 04/09/16 [phone]

## 2016-04-09 NOTE — Progress Notes (Signed)
Removed 20 staples from midline abdominal incision per order. When changing G/J-tube dressing, noticed that there are 2 staples near the tube underneath the drain sponge. RN did not remove those 2 staples, as unsure if MD wanted them removed or not.

## 2016-04-09 NOTE — Progress Notes (Signed)
Subjective: Feeling a bit better, but still having a lot of reflux.    Objective: Vital signs in last 24 hours: Temp:  [98.7 F (37.1 C)-100.2 F (37.9 C)] 98.7 F (37.1 C) (03/28 1400) Pulse Rate:  [75-86] 77 (03/28 1400) Resp:  [16-18] 18 (03/28 1400) BP: (134-149)/(68-92) 139/68 (03/28 1400) SpO2:  [94 %-99 %] 98 % (03/28 1400) Weight:  [111.6 kg (246 lb)] 111.6 kg (246 lb) (03/27 1916) Last BM Date: 04/08/16  Intake/Output from previous day: 03/27 0701 - 03/28 0700 In: 2565.8 [P.O.:90; I.V.:291.7; NG/GT:124.2; IV Piggyback:2000] Out: 1490 [Urine:300; Drains:290] Intake/Output this shift: Total I/O In: 1000 [I.V.:1000] Out: 800 [Urine:300; Drains:500]  General appearance: alert, cooperative and no distress GI: incision c/d/i.  staples in, no erythema.  tube functional. G tube to gravity, J tube to feeding. No evidence of tube feeds in gastric contents.   Extremities: extremities normal, atraumatic, no cyanosis or edema  Lab Results:   Recent Labs  04/08/16 2219 04/09/16 0348  WBC 9.8 9.3  HGB 10.0* 9.6*  HCT 30.7* 28.3*  PLT 268 247   BMET  Recent Labs  04/08/16 2219 04/09/16 0348  NA 130*  --   K 4.3  --   CL 104  --   CO2 16*  --   GLUCOSE 102*  --   BUN 39*  --   CREATININE 2.69* 2.48*  CALCIUM 10.1  --    PT/INR No results for input(s): LABPROT, INR in the last 72 hours. ABG No results for input(s): PHART, HCO3 in the last 72 hours.  Invalid input(s): PCO2, PO2  Studies/Results: Ct Abdomen Pelvis Wo Contrast  Result Date: 04/08/2016 CLINICAL DATA:  Acute onset of generalized abdominal pain. Initial encounter. EXAM: CT ABDOMEN AND PELVIS WITHOUT CONTRAST TECHNIQUE: Multidetector CT imaging of the abdomen and pelvis was performed following the standard protocol without IV contrast. COMPARISON:  PET/CT performed 02/07/2016 FINDINGS: Lower chest: Mild bibasilar atelectasis is noted. Mild calcification is seen at the mitral valve. Hepatobiliary:  The liver is unremarkable in appearance. The gallbladder is unremarkable in appearance. The common bile duct remains normal in caliber. Pancreas: The pancreas is within normal limits. Spleen: The spleen is unremarkable in appearance. Adrenals/Urinary Tract: The adrenal glands are unremarkable in appearance. The kidneys are within normal limits. There is no evidence of hydronephrosis. No renal or ureteral stones are identified. No perinephric stranding is seen. Stomach/Bowel: The patient's G-tube is noted coiled within the stomach. The patient is status post partial gastrectomy. The distal duodenum is unremarkable in appearance. No focal small bowel abnormalities are seen. The small bowel is largely decompressed. The appendix is normal in caliber, without evidence of appendicitis. Scattered diverticulosis is noted along the descending and proximal sigmoid colon, without evidence of diverticulitis. Vascular/Lymphatic: Scattered calcification is seen along the abdominal aorta and its branches. The abdominal aorta is otherwise grossly unremarkable. The inferior vena cava is grossly unremarkable. No retroperitoneal lymphadenopathy is seen. No pelvic sidewall lymphadenopathy is identified. Reproductive: The bladder is mildly distended and contains trace air, raising question for recent Foley catheter placement. The prostate is borderline enlarged, measuring 4.9 cm in transverse dimension. 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 Musculoskeletal: No acute osseous abnormalities are identified. The visualized musculature is unremarkable in appearance. IMPRESSION: 1. Large 22.0 x 8.0 x 15.7 cm area of edema within the omentum,  with associated fluid and scattered foci of air, concerning for a very large acute evolving omental infarct. 2. Status post partial gastrectomy. Remaining stomach is  grossly unremarkable. G-tube is noted coiled within the stomach. 3. Borderline enlarged prostate. 4. Scattered aortic atherosclerosis. 5. Mild bibasilar atelectasis. 6. Mild calcification at the mitral valve. 7. Scattered diverticulosis along the descending and proximal sigmoid colon, without evidence of diverticulitis. Electronically Signed   By: Garald Balding M.D.   On: 04/08/2016 23:48    Anti-infectives: Anti-infectives    None      Assessment/Plan: s/p distal gastrectomy with B2 reconstruction and gastrojejunostomy tube, readmitted for acute kidney injury  Dehydration.  Continue IV fluids today.  Continue tube feeds. Advance to goal. Keep G tube to gravity.  Palliative clear liquids.     LOS: 0 days    [facility] 04/09/2016

## 2016-04-09 NOTE — H&P (Signed)
Jacob [name] is an 71 y.o. male.   Chief Complaint: Nausea, dehydration HPI: 71 yo male - patient of Dr. 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  He had a G-J tube placed at surgery.  There have been problems with the tube post-op and the tube was changed recently.  He was discharged on 04/07/16 with a functioning tube, but had persistent nausea at home.  He had some vomiting, diarrhea, and some abdominal pain.  The family is concerned that they are not able to manage him at home, so they returned to the ED.  He may need SNF placement.  Past Medical History:  Diagnosis Date  . Anemia    hx iron deficiency  . Anxiety   . Arthritis    gout  . Back pain   . BPH with obstruction/lower urinary tract symptoms   . Chronic kidney disease    "they said I do"  . Constipation   . Depression   . ED (erectile dysfunction)   . Epigastric pain   . GERD (gastroesophageal reflux disease)   . Heartburn   . History of blood transfusion   . History of radiation therapy 11/03/11-12/29/11   prostate  . Hypercholesterolemia   . Hypertension   . Night sweats   . Post-operative nausea and vomiting 04/09/2016  . Prostate cancer ([place]) 08/14/11   Adenocarcinoma,gleason:3+3=6,& 3+4=7,PSA=5.66  . PUD (peptic ulcer disease)   . Sleep apnea    does not use Cpap but does use O2 @ 2l   . Stomach cancer ([place])   . Ulcer ([place])    peptic ulcer hx    Past Surgical History:  Procedure Laterality Date  . colon polyps bx  11/24/06   colon,transverse and rectosigmoid polyps:tubular adenomas and hyperplastic polyps,no high grade dysplasia or malignancy   . duodenal bx  11/24/06   benign  . ESOPHAGOGASTRODUODENOSCOPY N/A 10/27/2015   Procedure: ESOPHAGOGASTRODUODENOSCOPY (EGD);  Surgeon: [name], MD;  Location: [facility];  Service: Endoscopy;  Laterality: N/A;  . EUS N/A 11/08/2015   Procedure: UPPER ENDOSCOPIC ULTRASOUND (EUS) RADIAL;  Surgeon: [name], MD;   Location: [facility];  Service: Endoscopy;  Laterality: N/A;  . GASTRECTOMY N/A 03/25/2016   Procedure: DISTAL GASTRECTOMY;  Surgeon: [name], MD;  Location: [place];  Service: General;  Laterality: N/A;  . gastric bx  11/24/06   chronic active gastritis,with metaplasia and focal changaes of xanthelasma  . GASTROSTOMY N/A 03/25/2016   Procedure: INSERTION OF FEEDING TUBE;  Surgeon: [name], MD;  Location: [place];  Service: General;  Laterality: N/A;  . INSERTION PROSTATE RADIATION SEED  12-29-11  . IR GENERIC HISTORICAL  04/01/2016   IR GJ TUBE CHANGE 04/01/2016 Markus Daft, MD [facility]  . IR GENERIC HISTORICAL  04/05/2016   IR GJ TUBE CHANGE 04/05/2016 Markus Daft, MD [facility]  . LAPAROSCOPIC GASTRECTOMY  03/25/2016   Diagnostic laparoscopy, distal gastrectomy with Billroth 2 reconstruction and gastrojejunostomy tube  . LAPAROSCOPY N/A 03/25/2016   Procedure: DIAGNOSTIC LAPAROSCOPY;  Surgeon: [name], MD;  Location: [place];  Service: General;  Laterality: N/A;  . PORTACATH PLACEMENT Left 12/04/2015   Procedure: INSERTION PORT-A-CATH;  Surgeon: [name], MD;  Location: [place];  Service: General;  Laterality: Left;  . PROSTATE BIOPSY  08/14/11   Adenocarcinoma/volume=58.51cc,gleason=3+3=6 & 3+4=7  . TONSILLECTOMY     71 years old    Family History  Problem Relation Age of Onset  . Cancer Mother  NOS  . Alcohol abuse Father   . Alcohol abuse Brother 50  . Cancer Paternal Aunt     NOS  . Colon cancer Neg Hx    Social History:  reports that he quit smoking about 16 years ago. His smoking use included Cigarettes. He has a 45.00 pack-year smoking history. He has never used smokeless tobacco. He reports that he drinks about 0.6 oz of alcohol per week . He reports that he does not use drugs.  Allergies:  Allergies  Allergen Reactions  . No Known Allergies     Prior to Admission medications   Medication Sig Start Date End Date Taking? Authorizing Provider   acetaminophen (TYLENOL) 500 MG tablet Take 1,000 mg by mouth daily as needed for moderate pain or headache.   Yes Historical Provider, MD  allopurinol (ZYLOPRIM) 300 MG tablet Take 300 mg by mouth daily.  05/10/15  Yes Historical Provider, MD  buPROPion (WELLBUTRIN XL) 300 MG 24 hr tablet Take 300 mg by mouth daily.  05/10/15  Yes Historical Provider, MD  calcium carbonate (TUMS - DOSED IN MG ELEMENTAL CALCIUM) 500 MG chewable tablet Chew 3 tablets by mouth 2 (two) times daily as needed for indigestion or heartburn.   Yes Historical Provider, MD  diazepam (VALIUM) 5 MG tablet Take 5 mg by mouth at bedtime as needed (sleep).    Yes Historical Provider, MD  doxylamine, Sleep, (GNP SLEEP AID) 25 MG tablet Take 25-50 mg by mouth at bedtime as needed for sleep.   Yes Historical Provider, MD  furosemide (LASIX) 80 MG tablet Take 40 mg by mouth every other day.    Yes Historical Provider, MD  gabapentin (NEURONTIN) 800 MG tablet Take 800 mg by mouth at bedtime.    Yes Historical Provider, MD  lidocaine-prilocaine (EMLA) cream Apply 1 application topically as needed. Apply to port before chemotherapy. Patient taking differently: Apply 1 application topically daily as needed (prior to port access). Apply to port before chemotherapy. 11/22/15  Yes Wyatt Portela, MD  Melatonin 10 MG TABS Take 1 tablet by mouth at bedtime as needed (sleep).    Yes Historical Provider, MD  Nebivolol HCl (BYSTOLIC) 20 MG TABS Take 20 mg by mouth daily.    Yes Historical Provider, MD  Nutritional Supplements (FEEDING SUPPLEMENT, JEVITY 1.5 CAL/FIBER,) LIQD Place 1,000 mLs into feeding tube continuous. 60 mL/hr over 18 hours. 04/07/16  Yes Jessica L Focht, PA  oxyCODONE (OXY IR/ROXICODONE) 5 MG immediate release tablet Take 1 tablet (5 mg total) by mouth every 4 (four) hours as needed for moderate pain. 04/07/16  Yes Kalman Drape, PA  prochlorperazine (COMPAZINE) 10 MG tablet Take 1 tablet (10 mg total) by mouth every 6 (six) hours  as needed for nausea or vomiting. 11/22/15  Yes Wyatt Portela, MD  tamsulosin (FLOMAX) 0.4 MG CAPS capsule TAKE 1 CAPSULE BY MOUTH DAILY 06/19/14  Yes Arloa Koh, MD  traZODone (DESYREL) 50 MG tablet Take 50 mg by mouth at bedtime.  05/10/15  Yes Historical Provider, MD  Verapamil HCl CR 300 MG CP24 Take 300 mg by mouth at bedtime.  04/01/15  Yes Historical Provider, MD  oxyCODONE (OXY IR/ROXICODONE) 5 MG immediate release tablet Take 1-2 tablets (5-10 mg total) by mouth every 6 (six) hours as needed for moderate pain, severe pain or breakthrough pain. Patient not taking: Reported on 03/14/2016 12/04/15   [name], MD  OXYGEN Inhale 2 L into the lungs at bedtime as needed (for shortness of  breath).    Historical Provider, MD     Results for orders placed or performed during the hospital encounter of 04/08/16 (from the past 48 hour(s))  CBC with Differential     Status: Abnormal   Collection Time: 04/08/16 10:19 PM  Result Value Ref Range   WBC 9.8 4.0 - 10.5 K/uL   RBC 3.30 (L) 4.22 - 5.81 MIL/uL   Hemoglobin 10.0 (L) 13.0 - 17.0 g/dL   HCT 30.7 (L) 39.0 - 52.0 %   MCV 93.0 78.0 - 100.0 fL   MCH 30.3 26.0 - 34.0 pg   MCHC 32.6 30.0 - 36.0 g/dL   RDW 17.4 (H) 11.5 - 15.5 %   Platelets 268 150 - 400 K/uL   Neutrophils Relative % 80 %   Neutro Abs 7.8 (H) 1.7 - 7.7 K/uL   Lymphocytes Relative 9 %   Lymphs Abs 0.9 0.7 - 4.0 K/uL   Monocytes Relative 11 %   Monocytes Absolute 1.1 (H) 0.1 - 1.0 K/uL   Eosinophils Relative 0 %   Eosinophils Absolute 0.0 0.0 - 0.7 K/uL   Basophils Relative 0 %   Basophils Absolute 0.0 0.0 - 0.1 K/uL  Comprehensive metabolic panel     Status: Abnormal   Collection Time: 04/08/16 10:19 PM  Result Value Ref Range   Sodium 130 (L) 135 - 145 mmol/L   Potassium 4.3 3.5 - 5.1 mmol/L   Chloride 104 101 - 111 mmol/L   CO2 16 (L) 22 - 32 mmol/L   Glucose, Bld 102 (H) 65 - 99 mg/dL   BUN 39 (H) 6 - 20 mg/dL   Creatinine, Ser 2.69 (H) 0.61 - 1.24 mg/dL    Calcium 10.1 8.9 - 10.3 mg/dL   Total Protein 7.2 6.5 - 8.1 g/dL   Albumin 2.8 (L) 3.5 - 5.0 g/dL   AST 42 (H) 15 - 41 U/L   ALT 24 17 - 63 U/L   Alkaline Phosphatase 46 38 - 126 U/L   Total Bilirubin 0.5 0.3 - 1.2 mg/dL   GFR calc non Af Amer 22 (L) >60 mL/min   GFR calc Af Amer 26 (L) >60 mL/min    Comment: (NOTE) The eGFR has been calculated using the CKD EPI equation. This calculation has not been validated in all clinical situations. eGFR's persistently <60 mL/min signify possible Chronic Kidney Disease.    Anion gap 10 5 - 15  Lipase, blood     Status: Abnormal   Collection Time: 04/08/16 10:19 PM  Result Value Ref Range   Lipase 128 (H) 11 - 51 U/L  I-Stat CG4 Lactic Acid, ED     Status: Abnormal   Collection Time: 04/08/16 11:58 PM  Result Value Ref Range   Lactic Acid, Venous 0.37 (L) 0.5 - 1.9 mmol/L   Ct Abdomen Pelvis Wo Contrast  Result Date: 04/08/2016 CLINICAL DATA:  Acute onset of generalized abdominal pain. Initial encounter. EXAM: CT ABDOMEN AND PELVIS WITHOUT CONTRAST TECHNIQUE: Multidetector CT imaging of the abdomen and pelvis was performed following the standard protocol without IV contrast. COMPARISON:  PET/CT performed 02/07/2016 FINDINGS: Lower chest: Mild bibasilar atelectasis is noted. Mild calcification is seen at the mitral valve. Hepatobiliary: The liver is unremarkable in appearance. The gallbladder is unremarkable in appearance. The common bile duct remains normal in caliber. Pancreas: The pancreas is within normal limits. Spleen: The spleen is unremarkable in appearance. Adrenals/Urinary Tract: The adrenal glands are unremarkable in appearance. The kidneys are within normal limits. There is  no evidence of hydronephrosis. No renal or ureteral stones are identified. No perinephric stranding is seen. Stomach/Bowel: The patient's G-tube is noted coiled within the stomach. The patient is status post partial gastrectomy. The distal duodenum is unremarkable in  appearance. No focal small bowel abnormalities are seen. The small bowel is largely decompressed. The appendix is normal in caliber, without evidence of appendicitis. Scattered diverticulosis is noted along the descending and proximal sigmoid colon, without evidence of diverticulitis. Vascular/Lymphatic: Scattered calcification is seen along the abdominal aorta and its branches. The abdominal aorta is otherwise grossly unremarkable. The inferior vena cava is grossly unremarkable. No retroperitoneal lymphadenopathy is seen. No pelvic sidewall lymphadenopathy is identified. Reproductive: The bladder is mildly distended and contains trace air, raising question for recent Foley catheter placement. The prostate is borderline enlarged, measuring 4.9 cm in transverse dimension. 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 Musculoskeletal: No acute osseous abnormalities are identified. The visualized musculature is unremarkable in appearance. IMPRESSION: 1. 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 2. Status post partial gastrectomy. Remaining stomach is grossly unremarkable. G-tube is noted coiled within the stomach. 3. Borderline enlarged prostate. 4. Scattered aortic atherosclerosis. 5. Mild bibasilar atelectasis. 6. Mild calcification at the mitral valve. 7. Scattered diverticulosis along the descending and proximal sigmoid colon, without evidence of diverticulitis. Electronically Signed   By: Garald Balding M.D.   On: 04/08/2016 23:48    Review of Systems  Constitutional: Negative for weight loss.  HENT: Negative for ear discharge, ear pain, hearing loss and tinnitus.   Eyes: Negative for blurred vision, double vision, photophobia and pain.  Respiratory:  Negative for cough, sputum production and shortness of breath.   Cardiovascular: Negative for chest pain.  Gastrointestinal: Positive for abdominal pain, diarrhea, nausea and vomiting.  Genitourinary: Negative for dysuria, flank pain, frequency and urgency.  Musculoskeletal: Negative for back pain, falls, joint pain, myalgias and neck pain.  Neurological: Negative for dizziness, tingling, sensory change, focal weakness, loss of consciousness and headaches.  Endo/Heme/Allergies: Does not bruise/bleed easily.  Psychiatric/Behavioral: Negative for depression, memory loss and substance abuse. The patient is not nervous/anxious.     Blood pressure (!) 142/81, pulse 75, resp. rate 16, height 6' (1.829 m), weight 111.6 kg (246 lb), SpO2 95 %. Physical Exam  WDWN in NAD Eyes:  Pupils equal, round; sclera anicteric HENT:  Oral mucosa moist; good dentition  Neck:  No masses palpated, no thyromegaly Lungs:  CTA bilaterally; normal respiratory effort CV:  Regular rate and rhythm; no murmurs; extremities well-perfused with no edema Abd:  +bowel sounds, soft, mild generalized tenderness; healing midline incision. G-J tube intact with greenish drainage in bag Skin:  Warm, dry; no sign of jaundice Psychiatric - alert and oriented x 4; calm mood and affect  Assessment/Plan Post-op nausea, vomiting, mild dehydration after gastric resection  Will admit to Dr. Barry Dienes at [facility] for IV hydration Will explore possible SNF placement until family is able to care for the patient at home.  Maia Petties., MD 04/09/2016, 12:22 AM

## 2016-04-09 NOTE — ED Notes (Signed)
[facility] called for transport. 

## 2016-04-09 NOTE — Progress Notes (Signed)
Initial Nutrition Assessment  DOCUMENTATION CODES:   Obesity unspecified  INTERVENTION:  Tube feeds via PEJ- Change to Osmolite 1.5 @ goal rate of 34m/hr.   Prostat BID via PEJ  Regimen provides 2360kcal/day, 120g protein, 0 fiber, and 10930mfree water  Recommend water flushes of 20064mvery 4 hours   NUTRITION DIAGNOSIS:   Inadequate oral intake related to altered GI function as evidenced by other (see comment), meal completion < 25% (patient with PEJ on tube feeds).  GOAL:   Patient will meet greater than or equal to 90% of their needs  MONITOR:   PO intake, TF tolerance, Labs, Weight trends  REASON FOR ASSESSMENT:   Malnutrition Screening Tool    ASSESSMENT:   [age] male - patient of Dr. 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  He had a G-J tube placed at surgery.  There have been problems with the tube post-op and the tube was changed recently.  He was discharged on 04/07/16 with a functioning tube, but had persistent nausea at home.  He had some vomiting, diarrhea, and some abdominal pain.  The family is concerned that they are not able to manage him at home, so they returned to the ED.  He may need SNF placement.   Met with pt in room today. Pt confused; RD unable to obtain nutrition related history. Pt s/p gastric resection with PEJ tube placed 3/15 and then revised 3/20 r/t the tube clogging. Per chart, pt has lost 24lbs(9%) in 3 weeks which is severe. Pt on clear liquid diet but reports abdominal discomfort and not eating or drinking by mouth. [facility] there are some concerns of increased feelings of fullness and abdominal discomfort. Pt initiated on [facility] this admit. RD will change tube feeds over to Osmolite 1.5. Osmolite is lower in fiber and will be a better formula for pt to discharge home with. Will start tube feeds over 24hr time frame; if pt tolerates can cycle down to 14 hr overnight feedings to  encourage increased oral intake during the day. RD will continue to follow for nutritional needs.   Medications reviewed and include: allopurinol, lovenox, NaCl w/ KCl, morphine, oxycodone  Labs reviewed: Na 130(L), BUN 39(H), creat 2.48(H), alb 2.8(L), Lipase 128(H), AST 42(H) Hgb 9.6(L), Hct 28.3(L)  Nutrition-Focused physical exam completed. Findings are no fat depletion, no muscle depletion, and mild edema.   Diet Order:  Diet clear liquid Room service appropriate? Yes  Skin:  Wound (see comment) (recent abdominal incision)  Last BM:  3/27  Height:   Ht Readings from Last 1 Encounters:  04/08/16 6' (1.829 m)    Weight:   Wt Readings from Last 1 Encounters:  04/08/16 246 lb (111.6 kg)    Ideal Body Weight:  80.9 kg  BMI:  Body mass index is 33.36 kg/m.  Estimated Nutritional Needs:   Kcal:  2200-2500kcal/day   Protein:  123-145g/day   Fluid:  >2.2L/day   EDUCATION NEEDS:   No education needs identified at this time  CasKoleen DistanceD, LDN Pager #- [phone]

## 2016-04-10 LAB — BASIC METABOLIC PANEL
ANION GAP: 4 — AB (ref 5–15)
BUN: 31 mg/dL — ABNORMAL HIGH (ref 6–20)
CALCIUM: 9.6 mg/dL (ref 8.9–10.3)
CO2: 16 mmol/L — ABNORMAL LOW (ref 22–32)
CREATININE: 2.03 mg/dL — AB (ref 0.61–1.24)
Chloride: 115 mmol/L — ABNORMAL HIGH (ref 101–111)
GFR calc non Af Amer: 32 mL/min — ABNORMAL LOW (ref >60)
GFR, EST AFRICAN AMERICAN: 37 mL/min — AB (ref >60)
Glucose, Bld: 143 mg/dL — ABNORMAL HIGH (ref 65–99)
Potassium: 5 mmol/L (ref 3.5–5.1)
SODIUM: 135 mmol/L (ref 135–145)

## 2016-04-10 MED ORDER — DEXTROSE-NACL 5-0.45 % IV SOLN
INTRAVENOUS | Status: DC
  Administered 2016-04-10 – 2016-04-12 (×6): via INTRAVENOUS

## 2016-04-10 MED ORDER — SODIUM CHLORIDE 0.9% FLUSH
10.0000 mL | INTRAVENOUS | Status: DC | PRN
  Administered 2016-04-11 – 2016-04-19 (×6): 10 mL
  Filled 2016-04-10 (×6): qty 40

## 2016-04-11 LAB — BASIC METABOLIC PANEL
ANION GAP: 3 — AB (ref 5–15)
BUN: 31 mg/dL — ABNORMAL HIGH (ref 6–20)
CHLORIDE: 116 mmol/L — AB (ref 101–111)
CO2: 16 mmol/L — AB (ref 22–32)
Calcium: 9.1 mg/dL (ref 8.9–10.3)
Creatinine, Ser: 1.65 mg/dL — ABNORMAL HIGH (ref 0.61–1.24)
GFR calc non Af Amer: 40 mL/min — ABNORMAL LOW (ref >60)
GFR, EST AFRICAN AMERICAN: 47 mL/min — AB (ref >60)
GLUCOSE: 229 mg/dL — AB (ref 65–99)
POTASSIUM: 5.1 mmol/L (ref 3.5–5.1)
Sodium: 135 mmol/L (ref 135–145)

## 2016-04-11 LAB — CBC
HEMATOCRIT: 24.6 % — AB (ref 39.0–52.0)
HEMOGLOBIN: 8.2 g/dL — AB (ref 13.0–17.0)
MCH: 31.7 pg (ref 26.0–34.0)
MCHC: 33.3 g/dL (ref 30.0–36.0)
MCV: 95 fL (ref 78.0–100.0)
Platelets: 186 10*3/uL (ref 150–400)
RBC: 2.59 MIL/uL — AB (ref 4.22–5.81)
RDW: 17.9 % — AB (ref 11.5–15.5)
WBC: 6.1 10*3/uL (ref 4.0–10.5)

## 2016-04-11 MED ORDER — MORPHINE SULFATE (PF) 2 MG/ML IV SOLN
2.0000 mg | INTRAVENOUS | Status: DC | PRN
  Administered 2016-04-11 – 2016-04-12 (×2): 2 mg via INTRAVENOUS
  Filled 2016-04-11 (×2): qty 1

## 2016-04-11 NOTE — Care Management Note (Signed)
Case Management Note  Patient Details  Name: Jacob [name] MRN: [id number] Date of Birth: June 20, 1945  Subjective/Objective:  71 y/o m admitted w/p/o nausea. From home.                  Action/Plan:d/c plan home.   Expected Discharge Date:                  Expected Discharge Plan:  Home/Self Care  In-House Referral:     Discharge planning Services  CM Consult  Post Acute Care Choice:    Choice offered to:     DME Arranged:    DME Agency:     HH Arranged:    [place] Agency:     Status of Service:  Completed, signed off  If discussed at [employer] of Stay Meetings, dates discussed:    Additional Comments:  Dessa Phi, RN 04/11/2016, 1:22 PM

## 2016-04-11 NOTE — Progress Notes (Signed)
  Subjective: Feeling a bit better, did not tolerate clamping trial  Objective: Vital signs in last 24 hours: Temp:  [98.1 F (36.7 C)-98.9 F (37.2 C)] 98.2 F (36.8 C) (03/30 0503) Pulse Rate:  [66-76] 73 (03/30 0503) Resp:  [16] 16 (03/30 0503) BP: (116-136)/(67-75) 132/74 (03/30 0503) SpO2:  [96 %-98 %] 96 % (03/30 0503) Last BM Date: 04/10/16 (Per pt report)  Intake/Output from previous day: 03/29 0701 - 03/30 0700 In: 4505 [P.O.:740; I.V.:2235; NG/GT:1380] Out: 2825 [Urine:1475; Emesis/NG output:500; Drains:850] Intake/Output this shift: Total I/O In: -  Out: 350 [Urine:350]  General appearance: alert, cooperative and no distress GI: incision c/d/i.   no erythema.  tube functional. G tube to gravity, J tube to feeding. No evidence of tube feeds in gastric contents.   Extremities: extremities normal, atraumatic, no cyanosis or edema  Lab Results:   Recent Labs  04/09/16 0348 04/11/16 0505  WBC 9.3 6.1  HGB 9.6* 8.2*  HCT 28.3* 24.6*  PLT 247 186   BMET  Recent Labs  04/10/16 0842 04/11/16 0505  NA 135 135  K 5.0 5.1  CL 115* 116*  CO2 16* 16*  GLUCOSE 143* 229*  BUN 31* 31*  CREATININE 2.03* 1.65*  CALCIUM 9.6 9.1   PT/INR No results for input(s): LABPROT, INR in the last 72 hours. ABG No results for input(s): PHART, HCO3 in the last 72 hours.  Invalid input(s): PCO2, PO2  Studies/Results: No results found.  Anti-infectives: Anti-infectives    None      Assessment/Plan: s/p distal gastrectomy with B2 reconstruction and gastrojejunostomy tube, readmitted for acute kidney injury  Dehydration.  Continue IV fluids today.  Continue tube feeds. Advance to goal. Keep G tube to gravity.  Palliative clear liquids.     LOS: 2 days    Latima Hamza C. 09/06/537

## 2016-04-12 MED ORDER — FREE WATER
200.0000 mL | Freq: Four times a day (QID) | Status: DC
  Administered 2016-04-12 – 2016-04-14 (×8): 200 mL

## 2016-04-12 NOTE — Progress Notes (Signed)
  Subjective: No changes from yesterday.  Did no ambulate  Objective: Vital signs in last 24 hours: Temp:  [97.7 F (36.5 C)-98.7 F (37.1 C)] 98 F (36.7 C) (03/31 0511) Pulse Rate:  [72-76] 76 (03/31 0511) Resp:  [16] 16 (03/31 0511) BP: (140-148)/(81-87) 146/82 (03/31 0511) SpO2:  [98 %-100 %] 98 % (03/31 0511) Last BM Date: 04/11/16  Intake/Output from previous day: 03/30 0701 - 03/31 0700 In: 9614.5 [P.O.:300; I.V.:7449.5; VO/HK:0677] Out: 2950 [Urine:1600; Drains:1350] Intake/Output this shift: No intake/output data recorded.  General appearance: alert, cooperative and no distress GI: incision c/d/i.   no erythema.  tube functional. G tube to gravity, J tube to feeding. No evidence of tube feeds in gastric contents.   Extremities: extremities normal, atraumatic, no cyanosis or edema  Lab Results:   Recent Labs  04/11/16 0505  WBC 6.1  HGB 8.2*  HCT 24.6*  PLT 186   BMET  Recent Labs  04/10/16 0842 04/11/16 0505  NA 135 135  K 5.0 5.1  CL 115* 116*  CO2 16* 16*  GLUCOSE 143* 229*  BUN 31* 31*  CREATININE 2.03* 1.65*  CALCIUM 9.6 9.1   PT/INR No results for input(s): LABPROT, INR in the last 72 hours. ABG No results for input(s): PHART, HCO3 in the last 72 hours.  Invalid input(s): PCO2, PO2  Studies/Results: No results found.  Anti-infectives: Anti-infectives    None      Assessment/Plan: s/p distal gastrectomy with B2 reconstruction and gastrojejunostomy tube, readmitted for acute kidney injury  Dehydration.  Decrease IV fluids today.  Continue tube feeds. Add free water bolus to J tube.   Recheck labs in AM Keep G tube to gravity.  Palliative clear liquids.     LOS: 3 days    Orit Sanville C. 0/34/0352

## 2016-04-13 LAB — BASIC METABOLIC PANEL
Anion gap: 4 — ABNORMAL LOW (ref 5–15)
BUN: 19 mg/dL (ref 6–20)
CALCIUM: 9.7 mg/dL (ref 8.9–10.3)
CHLORIDE: 112 mmol/L — AB (ref 101–111)
CO2: 16 mmol/L — ABNORMAL LOW (ref 22–32)
Creatinine, Ser: 1.49 mg/dL — ABNORMAL HIGH (ref 0.61–1.24)
GFR, EST AFRICAN AMERICAN: 53 mL/min — AB (ref >60)
GFR, EST NON AFRICAN AMERICAN: 46 mL/min — AB (ref >60)
Glucose, Bld: 105 mg/dL — ABNORMAL HIGH (ref 65–99)
POTASSIUM: 4.9 mmol/L (ref 3.5–5.1)
SODIUM: 132 mmol/L — AB (ref 135–145)

## 2016-04-13 NOTE — Progress Notes (Signed)
  Subjective: No changes from yesterday.  Feels ok  Objective: Vital signs in last 24 hours: Temp:  [97.7 F (36.5 C)-98.3 F (36.8 C)] 98.3 F (36.8 C) (04/01 0500) Pulse Rate:  [69-75] 69 (04/01 0500) Resp:  [14-18] 16 (04/01 0500) BP: (130-143)/(77-85) 140/77 (04/01 0500) SpO2:  [99 %-100 %] 99 % (04/01 0500) Last BM Date: 04/11/16  Intake/Output from previous day: 03/31 0701 - 04/01 0700 In: 4830.4 [P.O.:440; I.V.:1939.4; NG/GT:2451] Out: 2500 [Urine:1425; Drains:1075] Intake/Output this shift: No intake/output data recorded.  General appearance: alert, cooperative and no distress GI: incision c/d/i.   no erythema.  tube functional. G tube to gravity, J tube to feeding. No evidence of tube feeds in gastric contents.   Extremities: extremities normal, atraumatic, no cyanosis or edema  Lab Results:   Recent Labs  04/11/16 0505  WBC 6.1  HGB 8.2*  HCT 24.6*  PLT 186   BMET  Recent Labs  04/11/16 0505 04/13/16 0500  NA 135 132*  K 5.1 4.9  CL 116* 112*  CO2 16* 16*  GLUCOSE 229* 105*  BUN 31* 19  CREATININE 1.65* 1.49*  CALCIUM 9.1 9.7   PT/INR No results for input(s): LABPROT, INR in the last 72 hours. ABG No results for input(s): PHART, HCO3 in the last 72 hours.  Invalid input(s): PCO2, PO2  Studies/Results: No results found.  Anti-infectives: Anti-infectives    None      Assessment/Plan: s/p distal gastrectomy with B2 reconstruction and gastrojejunostomy tube, readmitted for acute kidney injury  Dehydration.  Saline lock IV fluids today.  Continue tube feeds and free water bolus to J tube.   Recheck labs in AM Keep G tube to gravity.  Palliative clear liquids.     LOS: 4 days    Ronnette Rump C. 09/17/766

## 2016-04-13 NOTE — Evaluation (Signed)
Physical Therapy Evaluation Patient Details Name: Jacob [name] MRN: [id number] DOB: 31-Mar-1945 Today's Date: 04/13/2016   History of Present Illness  Pt recently admitted Adenocarcinoma of stomach with distal gastrectomy,stomach drain. Also got  feeding tube.  Pt readmitted at suggestion of HHRN 2* FTT and with AKI and dehydration. PMH:  STage 3 kidney disease, HTN.    Clinical Impression  Pt admitted as above and presenting with functional mobility limitations 2* generalized weakness, nausea and mild ambulatory balance deficits.  Dependent on acute stay progress, pt could benefit from follow up rehab at SNF level to maximize IND and safety prior to return home with limited assist.    Follow Up Recommendations SNF;Home health PT    Equipment Recommendations  None recommended by PT    Recommendations for Other Services       Precautions / Restrictions Precautions Precautions: Fall Restrictions Weight Bearing Restrictions: No      Mobility  Bed Mobility Overal bed mobility: Needs Assistance Bed Mobility: Supine to Sit     Supine to sit: Supervision     General bed mobility comments: Increased time with assist to manage lines  Transfers Overall transfer level: Needs assistance Equipment used: Rolling walker (2 wheeled) Transfers: Sit to/from Stand Sit to Stand: Min guard         General transfer comment: to steady with initial standing  Ambulation/Gait Ambulation/Gait assistance: Min guard Ambulation Distance (Feet): 5 Feet Assistive device: Rolling walker (2 wheeled) Gait Pattern/deviations: Step-to pattern;Decreased step length - right;Decreased step length - left;Shuffle     General Gait Details: pt side stepped up side of bed - ltd by increasing nausea with mobility.  RN aware  Stairs            Wheelchair Mobility    Modified Rankin (Stroke Patients Only)       Balance Overall balance assessment: Needs assistance Sitting-balance  support: No upper extremity supported;Feet supported Sitting balance-Leahy Scale: Good     Standing balance support: Bilateral upper extremity supported;During functional activity Standing balance-Leahy Scale: Fair                               Pertinent Vitals/Pain Pain Assessment: No/denies pain    Home Living Family/patient expects to be discharged to:: Skilled nursing facility Living Arrangements: Spouse/significant other Available Help at Discharge: Family Type of Home: House Home Access: Stairs to enter Entrance Stairs-Rails: Right;Left;Can reach both Technical brewer of Steps: 4 Home Layout: One level Home Equipment: Bedside commode;Walker - 2 wheels      Prior Function Level of Independence: Needs assistance   Gait / Transfers Assistance Needed: RW as needed  ADL's / Homemaking Assistance Needed: Pt spouse if legally blind and uses a walker so very limited in ability to assist        Hand Dominance        Extremity/Trunk Assessment   Upper Extremity Assessment Upper Extremity Assessment: Overall WFL for tasks assessed    Lower Extremity Assessment Lower Extremity Assessment: Generalized weakness       Communication   Communication: No difficulties  Cognition Arousal/Alertness: Awake/alert Behavior During Therapy: WFL for tasks assessed/performed Overall Cognitive Status: Within Functional Limits for tasks assessed                                        General Comments  Exercises     Assessment/Plan    PT Assessment Patient needs continued PT services  PT Problem List Decreased strength;Decreased activity tolerance;Decreased balance;Decreased mobility;Decreased knowledge of use of DME       PT Treatment Interventions DME instruction;Gait training;Stair training;Functional mobility training;Therapeutic activities;Therapeutic exercise;Balance training;Patient/family education    PT Goals (Current goals  can be found in the Care Plan section)  Acute Rehab PT Goals Patient Stated Goal: Less nausea, regain IND PT Goal Formulation: With patient Time For Goal Achievement: 04/10/16 Potential to Achieve Goals: Good    Frequency Min 3X/week   Barriers to discharge Decreased caregiver support Spouse is legally blind and on walker    Co-evaluation               End of Session   Activity Tolerance: Patient limited by fatigue;Other (comment) (nausea) Patient left: in bed;with call bell/phone within reach Nurse Communication: Mobility status (nausea) PT Visit Diagnosis: Unsteadiness on feet (R26.81)    Time: 7517-0017 PT Time Calculation (min) (ACUTE ONLY): 32 min   Charges:   PT Evaluation $PT Eval Low Complexity: 1 Procedure PT Treatments $Therapeutic Activity: 8-22 mins   PT G Codes:         Turner Kunzman 04/13/2016, 1:53 PM

## 2016-04-14 LAB — BASIC METABOLIC PANEL
Anion gap: 4 — ABNORMAL LOW (ref 5–15)
BUN: 22 mg/dL — ABNORMAL HIGH (ref 6–20)
CALCIUM: 10.1 mg/dL (ref 8.9–10.3)
CO2: 17 mmol/L — ABNORMAL LOW (ref 22–32)
Chloride: 111 mmol/L (ref 101–111)
Creatinine, Ser: 1.6 mg/dL — ABNORMAL HIGH (ref 0.61–1.24)
GFR calc Af Amer: 49 mL/min — ABNORMAL LOW (ref >60)
GFR, EST NON AFRICAN AMERICAN: 42 mL/min — AB (ref >60)
GLUCOSE: 107 mg/dL — AB (ref 65–99)
Potassium: 4.9 mmol/L (ref 3.5–5.1)
SODIUM: 132 mmol/L — AB (ref 135–145)

## 2016-04-14 MED ORDER — FREE WATER
100.0000 mL | Freq: Four times a day (QID) | Status: DC
  Administered 2016-04-14 – 2016-04-17 (×9): 100 mL

## 2016-04-14 NOTE — Progress Notes (Signed)
qPhysical Therapy Treatment Patient Details Name: Jacob [name] MRN: [id number] DOB: 03/04/1945 Today's Date: 04/14/2016    History of Present Illness Pt recently admitted Adenocarcinoma of stomach with distal gastrectomy,stomach drain. Also got  feeding tube.  Pt readmitted at suggestion of HHRN 2* FTT and with AKI and dehydration. PMH:  STage 3 kidney disease, HTN.      PT Comments    The patient  Reports not feeling well, is nauseated. Did ambulate x 47' w/ RW. needed recliner to sit and rest, c/o feeling weak.. Assisted back into bed.  Follow Up Recommendations  SNF     Equipment Recommendations  None recommended by PT    Recommendations for Other Services       Precautions / Restrictions Precautions Precautions: Fall Precaution Comments: PEG with drain and TF Restrictions Weight Bearing Restrictions: No    Mobility  Bed Mobility Overal bed mobility: Needs Assistance Bed Mobility: Supine to Sit     Supine to sit: Supervision  Sit to supine: supervision     General bed mobility comments: Increased time with assist to manage lines  Transfers Overall transfer level: Needs assistance Equipment used: Rolling walker (2 wheeled) Transfers: Sit to/from Stand Sit to Stand: Min guard         General transfer comment: to steady with initial standing  Ambulation/Gait Ambulation/Gait assistance: Min guard Ambulation Distance (Feet): 80 Feet Assistive device: Rolling walker (2 wheeled) Gait Pattern/deviations: Step-through pattern     General Gait Details: slow speed and c/o nausea   Stairs            Wheelchair Mobility    Modified Rankin (Stroke Patients Only)       Balance                                            Cognition Arousal/Alertness: Awake/alert                                            Exercises      General Comments        Pertinent Vitals/Pain Pain Assessment: No/denies pain     Home Living                      Prior Function            PT Goals (current goals can now be found in the care plan section) Progress towards PT goals: Progressing toward goals    Frequency    Min 3X/week      PT Plan Current plan remains appropriate    Co-evaluation             End of Session Equipment Utilized During Treatment: Gait belt Activity Tolerance: Patient limited by fatigue;Other (comment) Patient left: in bed;with call bell/phone within reach, bed alarm on Nurse Communication: Mobility status PT Visit Diagnosis: Unsteadiness on feet (R26.81)     Time: 7681-1572 PT Time Calculation (min) (ACUTE ONLY): 25 min  Charges:  $Gait Training: 23-37 mins                    G CodesTresa Endo PT [phone]   Claretha Cooper 04/14/2016, 10:42 AM
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
## 2016-04-14 NOTE — Care Management Note (Signed)
Case Management Note  Patient Details  Name: Jacob [name] MRN: [id number] Date of Birth: 1945/08/31  Subjective/Objective: Transferred to 5W-has Gastrojejunostomy tube-surgery following. PT-recc SNF.CSW following for SNF.                   Action/Plan:d/c plan SNF.   Expected Discharge Date:                  Expected Discharge Plan:  Skilled Nursing Facility  In-House Referral:  Clinical Social Work  Discharge planning Services  CM Consult  Post Acute Care Choice:    Choice offered to:     DME Arranged:    DME Agency:     HH Arranged:    [place] Agency:     Status of Service:  In process, will continue to follow  If discussed at Long Length of Stay Meetings, dates discussed:    Additional Comments:  Dessa Phi, RN 04/14/2016, 1:05 PM

## 2016-04-14 NOTE — Progress Notes (Addendum)
  Progress Note: General Surgery Service   Subjective: +BM today, nauseated overnight  Objective: Vital signs in last 24 hours: Temp:  [98 F (36.7 C)-98.7 F (37.1 C)] 98 F (36.7 C) (04/02 0544) Pulse Rate:  [66-70] 70 (04/02 0544) Resp:  [15-16] 16 (04/02 0544) BP: (139-152)/(73-85) 147/77 (04/02 0544) SpO2:  [98 %-100 %] 98 % (04/02 0544) Last BM Date: 04/14/16  Intake/Output from previous day: 04/01 0701 - 04/02 0700 In: 1739 [P.O.:660; NG/GT:1079] Out: 2200 [Urine:1350; Drains:850] Intake/Output this shift: Total I/O In: 0  Out: 500 [Urine:300; Drains:200]  Lungs: CTAB  Cardiovascular: RRR  Abd: soft, NT, ND, incisions c/d/I, clear drainage in bag  Extremities: no edema  Neuro: slow, AOx4  Lab Results: CBC  No results for input(s): WBC, HGB, HCT, PLT in the last 72 hours. BMET  Recent Labs  04/13/16 0500 04/14/16 0449  NA 132* 132*  K 4.9 4.9  CL 112* 111  CO2 16* 17*  GLUCOSE 105* 107*  BUN 19 22*  CREATININE 1.49* 1.60*  CALCIUM 9.7 10.1   PT/INR No results for input(s): LABPROT, INR in the last 72 hours. ABG No results for input(s): PHART, HCO3 in the last 72 hours.  Invalid input(s): PCO2, PO2  Studies/Results:  Anti-infectives: Anti-infectives    None      Medications: Scheduled Meds: . allopurinol  300 mg Oral Daily  . buPROPion  300 mg Oral Daily  . enoxaparin (LOVENOX) injection  40 mg Subcutaneous Q24H  . feeding supplement (PRO-STAT SUGAR FREE 64)  30 mL Per Tube BID  . free water  100 mL Per Tube Q6H  . gabapentin  800 mg Oral QHS  . nebivolol  20 mg Oral Daily  . tamsulosin  0.4 mg Oral Daily  . traZODone  50 mg Oral QHS  . verapamil  300 mg Oral QHS   Continuous Infusions: . feeding supplement (OSMOLITE 1.5 CAL) 1,000 mL (04/13/16 1940)   PRN Meds:.diazepam, diphenhydrAMINE **OR** diphenhydrAMINE, morphine injection, ondansetron **OR** ondansetron (ZOFRAN) IV, oxyCODONE, sodium chloride  flush  Assessment/Plan: Patient Active Problem List   Diagnosis Date Noted  . Post-operative nausea and vomiting 04/09/2016  . 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 Genetic testing 12/19/2015  . Stomach cancer ([place])   . Gastric adenocarcinoma ([place])   . Gastric mass   . GI bleed 10/26/2015  . Gastrointestinal hemorrhage   . PUD (peptic ulcer disease)   . 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 Nocturnal hypoxemia 11/04/2013  . Noncompliance with treatment 09/02/2013  . Prostate cancer ([place]) 08/14/2011  . Iron deficiency anemia 03/19/2007  . DEPRESSION 03/19/2007  . Essential hypertension 03/19/2007  . NEOPLASM, BENIGN, STOMACH 11/23/2006  . POLYP, COLON 11/23/2006  . INTERNAL HEMORRHOIDS 11/23/2006  . GASTRITIS 11/23/2006  hyponatremia noted today, creatinine slightly increased  CT scan shows J portion of GJ tube in fundus of stomach -decrease free water flow today -recheck BMP in am -clamp G tube portion due to dislodgement of J tube portion to limit fluid loss     LOS: 5 days   Mickeal Skinner, MD Pg# [phone] [facility]

## 2016-04-14 NOTE — Progress Notes (Signed)
Initial Nutrition Assessment  DOCUMENTATION CODES:   Obesity unspecified  INTERVENTION:  Tube feeds via PEJ- Continue Osmolite 1.5 @ goal rate of 64ml/hr.   Prostat BID via PEJ  Regimen provides 2360kcal/day, 120g protein, 0 fiber, and 1081ml free water  Water flushes of 111ml every 6 hours per MD   NUTRITION DIAGNOSIS:   Inadequate oral intake related to altered GI function as evidenced by other (see comment), meal completion < 25% (patient with PEJ on tube feeds).  GOAL:   Patient will meet greater than or equal to 90% of their needs  MONITOR:   PO intake, TF tolerance, Labs, Weight trends  ASSESSMENT:   71 yo male - patient of Dr. 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  He had a G-J tube placed at surgery.  There have been problems with the tube post-op and the tube was changed recently.  He was discharged on 04/07/16 with a functioning tube, but had persistent nausea at home.  He had some vomiting, diarrhea, and some abdominal pain.  The family is concerned that they are not able to manage him at home, so they returned to the ED.  He may need SNF placement.   Pt continues to do well on tube feeds. Pt complaining of nausea overnight but no vomiting. Pt had BM today. Pt initiated on water flushes at 237ml every six hours on 3/31. Pt became hyponatremic and flushes were reduced to 183ml every six hours today. Per MD note, "CT scan shows J portion of GJ tube in fundus of stomach". Plan is to clamp G-tube to limit fluid loses. Plan is for this patient to eventually cycle down to nocturnal feeds x 14 hrs per pt and family request to stimulate appetite and give pt freedom during the day. RD will hold off on cycling tube feeds d/t dislodgment of J-tube and pt with nausea. Pt eating 0% of clear liquid diet but is drinking some water. RD will continue to monitor and will cycle tube feeds when appropriate.    Medications reviewed and include: allopurinol,  lovenox  Labs reviewed: Na 132(L), BUN 22(H), creat 1.6(H) Hgb 8.2(L), Hct 24.6(L)  Diet Order:  Diet clear liquid Room service appropriate? Yes  Skin:  Wound (see comment) (recent abdominal incision)  Last BM:  4/2  Height:   Ht Readings from Last 1 Encounters:  04/08/16 6' (1.829 m)    Weight:   Wt Readings from Last 1 Encounters:  04/08/16 246 lb (111.6 kg)    Ideal Body Weight:  80.9 kg  BMI:  Body mass index is 33.36 kg/m.  Estimated Nutritional Needs:   Kcal:  2200-2500kcal/day   Protein:  123-145g/day   Fluid:  >2.2L/day   EDUCATION NEEDS:   No education needs identified at this time  Koleen Distance, RD, LDN Pager #[phone]

## 2016-04-15 LAB — BASIC METABOLIC PANEL
Anion gap: 3 — ABNORMAL LOW (ref 5–15)
BUN: 26 mg/dL — AB (ref 6–20)
CHLORIDE: 111 mmol/L (ref 101–111)
CO2: 19 mmol/L — ABNORMAL LOW (ref 22–32)
CREATININE: 1.59 mg/dL — AB (ref 0.61–1.24)
Calcium: 10.2 mg/dL (ref 8.9–10.3)
GFR calc Af Amer: 49 mL/min — ABNORMAL LOW (ref >60)
GFR, EST NON AFRICAN AMERICAN: 42 mL/min — AB (ref >60)
GLUCOSE: 136 mg/dL — AB (ref 65–99)
Potassium: 4.9 mmol/L (ref 3.5–5.1)
SODIUM: 133 mmol/L — AB (ref 135–145)

## 2016-04-15 MED ORDER — KCL IN DEXTROSE-NACL 20-5-0.45 MEQ/L-%-% IV SOLN
INTRAVENOUS | Status: DC
  Administered 2016-04-15: 20:00:00 via INTRAVENOUS
  Filled 2016-04-15 (×2): qty 1000

## 2016-04-15 MED ORDER — MORPHINE SULFATE (PF) 4 MG/ML IV SOLN
2.0000 mg | INTRAVENOUS | Status: DC | PRN
  Administered 2016-04-15: 4 mg via INTRAVENOUS
  Filled 2016-04-15 (×2): qty 1

## 2016-04-15 NOTE — Progress Notes (Signed)
Dr. Kieth Brightly aware via phone pt recently nauseated. Zofran given IV. Md ordered to keep GT clamped x 2 hours and resume TF as ordered.

## 2016-04-15 NOTE — Progress Notes (Signed)
Nutrition Brief Follow-up  Per surgery note, patient now to receive TF via G-tube portion of G-J tube. J-tube portion was clogged. Osmolite 1.5 to continue at 60 ml/hr. 30 ml Prostat BID.  Will monitor for tolerance before initiating night feeds.   If nutrition issues arise, please consult RD.   Clayton Bibles, MS, RD, LDN Pager: [phone] After Hours Pager: [phone]

## 2016-04-15 NOTE — Progress Notes (Signed)
Dr. Lucia Gaskins aware via phone pt nauseated and having abd pain in lower lt side. Pt medicated. Md ordered to clamp GT overnight and begin IVF's. See new orders entered into EPIC.

## 2016-04-15 NOTE — NC FL2 (Signed)
[place] LEVEL OF CARE SCREENING TOOL     IDENTIFICATION  Patient Name: Jacob [name] Birthdate: 08-17-1945 Sex: male Admission Date (Current Location): 04/08/2016  [facility] and [place] Number:  Herbalist and Address:  [facility],  [place], [place]      Provider Number: [id number]  Attending Physician Name and Address:  [name], MD  Relative Name and Phone Number:    [place]    Current Level of Care: Hospital Recommended Level of Care: [place] Prior Approval Number:    Date Approved/Denied:   PASRR Number:    [id number]  Discharge Plan: SNF    Current Diagnoses: Patient Active Problem List   Diagnosis Date Noted  . Post-operative nausea and vomiting 04/09/2016  . 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 Genetic testing 12/19/2015  . Stomach cancer ([place])   . Gastric adenocarcinoma ([place])   . Gastric mass   . GI bleed 10/26/2015  . Gastrointestinal hemorrhage   . PUD (peptic ulcer disease)   . 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 Nocturnal hypoxemia 11/04/2013  . Noncompliance with treatment 09/02/2013  . Prostate cancer ([place]) 08/14/2011  . Iron deficiency anemia 03/19/2007  . DEPRESSION 03/19/2007  . Essential hypertension 03/19/2007  . NEOPLASM, BENIGN, STOMACH 11/23/2006  . POLYP, COLON 11/23/2006  . INTERNAL HEMORRHOIDS 11/23/2006  . GASTRITIS 11/23/2006    Orientation RESPIRATION BLADDER Height & Weight     Self, Time, Situation, Place   (1 L) Continent Weight: 246 lb (111.6 kg) Height:  6' (182.9 cm)  BEHAVIORAL SYMPTOMS/MOOD NEUROLOGICAL BOWEL NUTRITION STATUS      Continent Diet (Tube feeds via PEJ- Change to Osmolite 1.5 @ goal rate of 72ml/hr. )  AMBULATORY STATUS COMMUNICATION OF NEEDS Skin   Extensive Assist Verbally Normal                       Personal Care Assistance Level of Assistance   Bathing, Feeding, Dressing Bathing Assistance: Limited assistance   Dressing Assistance: Limited assistance     Functional Limitations Info  Sight, Hearing, Speech Sight Info: Adequate Hearing Info: Adequate Speech Info: Adequate    SPECIAL CARE FACTORS FREQUENCY  PT (By licensed PT)     PT Frequency: 5              Contractures Contractures Info: Not present    Additional Factors Info  Code Status, Allergies Code Status Info: Fullcode Allergies Info: No Known Allergies           Current Medications (04/15/2016):  This is the current hospital active medication list Current Facility-Administered Medications  Medication Dose Route Frequency Provider Last Rate Last Dose  . allopurinol (ZYLOPRIM) tablet 300 mg  300 mg Oral Daily Donnie Mesa, MD   300 mg at 04/14/16 0917  . buPROPion (WELLBUTRIN XL) 24 hr tablet 300 mg  300 mg Oral Daily Donnie Mesa, MD   300 mg at 04/13/16 1054  . diazepam (VALIUM) tablet 5 mg  5 mg Oral QHS PRN Donnie Mesa, MD      . diphenhydrAMINE (BENADRYL) 12.5 MG/5ML elixir 12.5 mg  12.5 mg Oral Q6H PRN Donnie Mesa, MD       Or  . diphenhydrAMINE (BENADRYL) injection 12.5 mg  12.5 mg Intravenous Q6H PRN Donnie Mesa, MD   12.5 mg at 04/14/16 0917  . enoxaparin (LOVENOX) injection 40 mg  40 mg Subcutaneous Q24H Rodman Key  Tsuei, MD   40 mg at 04/14/16 0918  . feeding supplement (OSMOLITE 1.5 CAL) liquid 1,000 mL  1,000 mL Per Tube Continuous [name], MD 60 mL/hr at 04/15/16 0600 1,000 mL at 04/15/16 0600  . feeding supplement (PRO-STAT SUGAR FREE 64) liquid 30 mL  30 mL Per Tube BID [name], MD   30 mL at 04/13/16 2339  . free water 100 mL  100 mL Per Tube Q6H Mickeal Skinner, MD   100 mL at 04/15/16 0600  . gabapentin (NEURONTIN) capsule 800 mg  800 mg Oral QHS Donnie Mesa, MD   800 mg at 04/12/16 2156  . morphine 4 MG/ML injection 2-4 mg  2-4 mg Intravenous Q2H PRN [name], MD      . nebivolol (BYSTOLIC) tablet 20 mg  20  mg Oral Daily Donnie Mesa, MD   20 mg at 04/14/16 0917  . ondansetron (ZOFRAN-ODT) disintegrating tablet 4 mg  4 mg Oral Q6H PRN Donnie Mesa, MD   4 mg at 04/15/16 0535   Or  . ondansetron [facility]) injection 4 mg  4 mg Intravenous Q6H PRN Donnie Mesa, MD   4 mg at 04/12/16 2145  . oxyCODONE (Oxy IR/ROXICODONE) immediate release tablet 5 mg  5 mg Oral Q4H PRN Donnie Mesa, MD   5 mg at 04/15/16 0535  . sodium chloride flush (NS) 0.9 % injection 10-40 mL  10-40 mL Intracatheter PRN [name], MD   10 mL at 04/15/16 0403  . tamsulosin (FLOMAX) capsule 0.4 mg  0.4 mg Oral Daily Donnie Mesa, MD   0.4 mg at 04/14/16 0917  . traZODone (DESYREL) tablet 50 mg  50 mg Oral QHS Donnie Mesa, MD   50 mg at 04/14/16 2348  . verapamil (CALAN-SR) CR tablet 300 mg  300 mg Oral QHS [name], MD   300 mg at 04/14/16 2349     Discharge Medications: Please see discharge summary for a list of discharge medications.  Relevant Imaging Results:  Relevant Lab Results:   Additional Information [phone]  Lia Hopping, LCSW

## 2016-04-15 NOTE — Progress Notes (Signed)
CSW discussed facility option with patient, spouse and daughter. Family is agreeable to [facility], the facility says it is close to their home. CSW confirmed placement with Florentina Jenny at [facility]. Facility will be able to manage patient feeding tubes.  Patient family has requested patient be transport by [facility]. CSW will continue to follow and assist with discharge, once medically stable.   Kathrin Greathouse, Latanya Presser, MSW Clinical Social Worker 5E and Psychiatric Service Line [phone] 04/15/2016  2:04 PM

## 2016-04-15 NOTE — Progress Notes (Signed)
Notified Dr. Zella Richer that nurse was unable to flush with free water this am, feeding continues with no problems and was flushed at midnight by nurse with no problem. Dr Zella Richer stated to keep running and inform Dr Kieth Brightly in am.

## 2016-04-15 NOTE — Progress Notes (Signed)
  Progress Note: General Surgery Service   Subjective: Nausea slightly improved, J tube clogged now, G tube left to drainage yesterday  Objective: Vital signs in last 24 hours: Temp:  [98.1 F (36.7 C)-98.5 F (36.9 C)] 98.1 F (36.7 C) (04/03 0515) Pulse Rate:  [70-79] 79 (04/03 0515) Resp:  [16-17] 17 (04/03 0515) BP: (129-141)/(76-82) 129/80 (04/03 0515) SpO2:  [92 %-100 %] 94 % (04/03 0515) Last BM Date: 04/14/16  Intake/Output from previous day: 04/02 0701 - 04/03 0700 In: 370 [P.O.:120; I.V.:10; NG/GT:240] Out: 2250 [Urine:1200; Drains:1050] Intake/Output this shift: No intake/output data recorded.  Lungs: CTAB  Cardiovascular: RRR  Abd: soft, NT, ND  Extremities: no edema  Neuro: AOx4  Lab Results: CBC  No results for input(s): WBC, HGB, HCT, PLT in the last 72 hours. BMET  Recent Labs  04/14/16 0449 04/15/16 0400  NA 132* 133*  K 4.9 4.9  CL 111 111  CO2 17* 19*  GLUCOSE 107* 136*  BUN 22* 26*  CREATININE 1.60* 1.59*  CALCIUM 10.1 10.2   PT/INR No results for input(s): LABPROT, INR in the last 72 hours. ABG No results for input(s): PHART, HCO3 in the last 72 hours.  Invalid input(s): PCO2, PO2  Studies/Results:  Anti-infectives: Anti-infectives    None      Medications: Scheduled Meds: . allopurinol  300 mg Oral Daily  . buPROPion  300 mg Oral Daily  . enoxaparin (LOVENOX) injection  40 mg Subcutaneous Q24H  . feeding supplement (PRO-STAT SUGAR FREE 64)  30 mL Per Tube BID  . free water  100 mL Per Tube Q6H  . gabapentin  800 mg Oral QHS  . nebivolol  20 mg Oral Daily  . tamsulosin  0.4 mg Oral Daily  . traZODone  50 mg Oral QHS  . verapamil  300 mg Oral QHS   Continuous Infusions: . feeding supplement (OSMOLITE 1.5 CAL) 1,000 mL (04/15/16 0600)   PRN Meds:.diazepam, diphenhydrAMINE **OR** diphenhydrAMINE, morphine injection, ondansetron **OR** ondansetron (ZOFRAN) IV, oxyCODONE, sodium chloride  flush  Assessment/Plan: Patient Active Problem List   Diagnosis Date Noted  . Post-operative nausea and vomiting 04/09/2016  . 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 Genetic testing 12/19/2015  . Stomach cancer ([place])   . Gastric adenocarcinoma ([place])   . Gastric mass   . GI bleed 10/26/2015  . Gastrointestinal hemorrhage   . PUD (peptic ulcer disease)   . 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 Nocturnal hypoxemia 11/04/2013  . Noncompliance with treatment 09/02/2013  . Prostate cancer ([place]) 08/14/2011  . Iron deficiency anemia 03/19/2007  . DEPRESSION 03/19/2007  . Essential hypertension 03/19/2007  . NEOPLASM, BENIGN, STOMACH 11/23/2006  . POLYP, COLON 11/23/2006  . INTERNAL HEMORRHOIDS 11/23/2006  . GASTRITIS 11/23/2006   -ok to attempt to flush J tube -start feedings through G tube at previous rate with no plans to vent unless nauseated    LOS: 6 days   Mickeal Skinner, MD Pg# [phone] [facility]

## 2016-04-16 ENCOUNTER — Inpatient Hospital Stay (HOSPITAL_COMMUNITY): Payer: Medicare Other

## 2016-04-16 LAB — BASIC METABOLIC PANEL
Anion gap: 5 (ref 5–15)
BUN: 34 mg/dL — AB (ref 6–20)
CHLORIDE: 107 mmol/L (ref 101–111)
CO2: 19 mmol/L — ABNORMAL LOW (ref 22–32)
CREATININE: 1.82 mg/dL — AB (ref 0.61–1.24)
Calcium: 10 mg/dL (ref 8.9–10.3)
GFR calc non Af Amer: 36 mL/min — ABNORMAL LOW (ref >60)
GFR, EST AFRICAN AMERICAN: 42 mL/min — AB (ref >60)
Glucose, Bld: 126 mg/dL — ABNORMAL HIGH (ref 65–99)
Potassium: 5.3 mmol/L — ABNORMAL HIGH (ref 3.5–5.1)
SODIUM: 131 mmol/L — AB (ref 135–145)

## 2016-04-16 LAB — CREATININE, SERUM
CREATININE: 1.83 mg/dL — AB (ref 0.61–1.24)
GFR calc non Af Amer: 36 mL/min — ABNORMAL LOW (ref >60)
GFR, EST AFRICAN AMERICAN: 41 mL/min — AB (ref >60)

## 2016-04-16 IMAGING — DX DG ABD PORTABLE 1V
2 series · 2 of 2 positions shown · non-contrast
Comparison: CT [DATE].

CLINICAL DATA: Nausea.

EXAM:
PORTABLE ABDOMEN - 1 VIEW

[abdomen kub (1 of 2)]
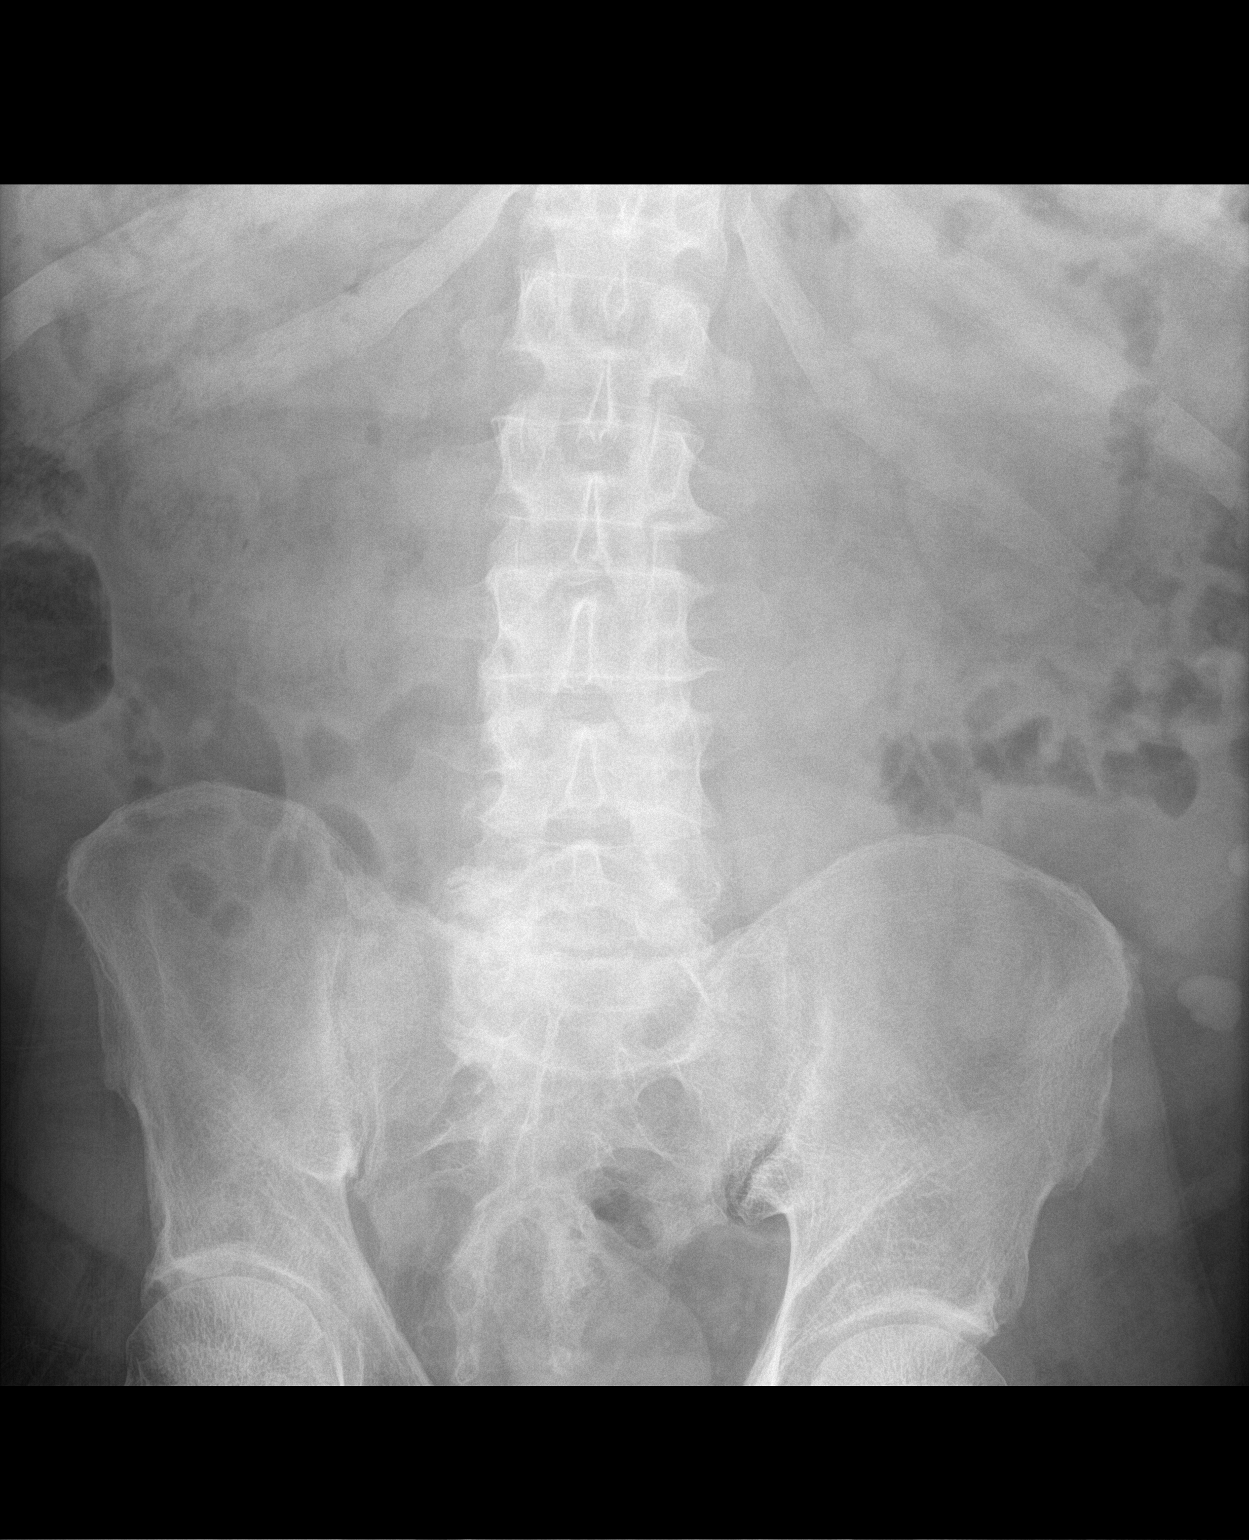

[abdomen kub (2 of 2)]
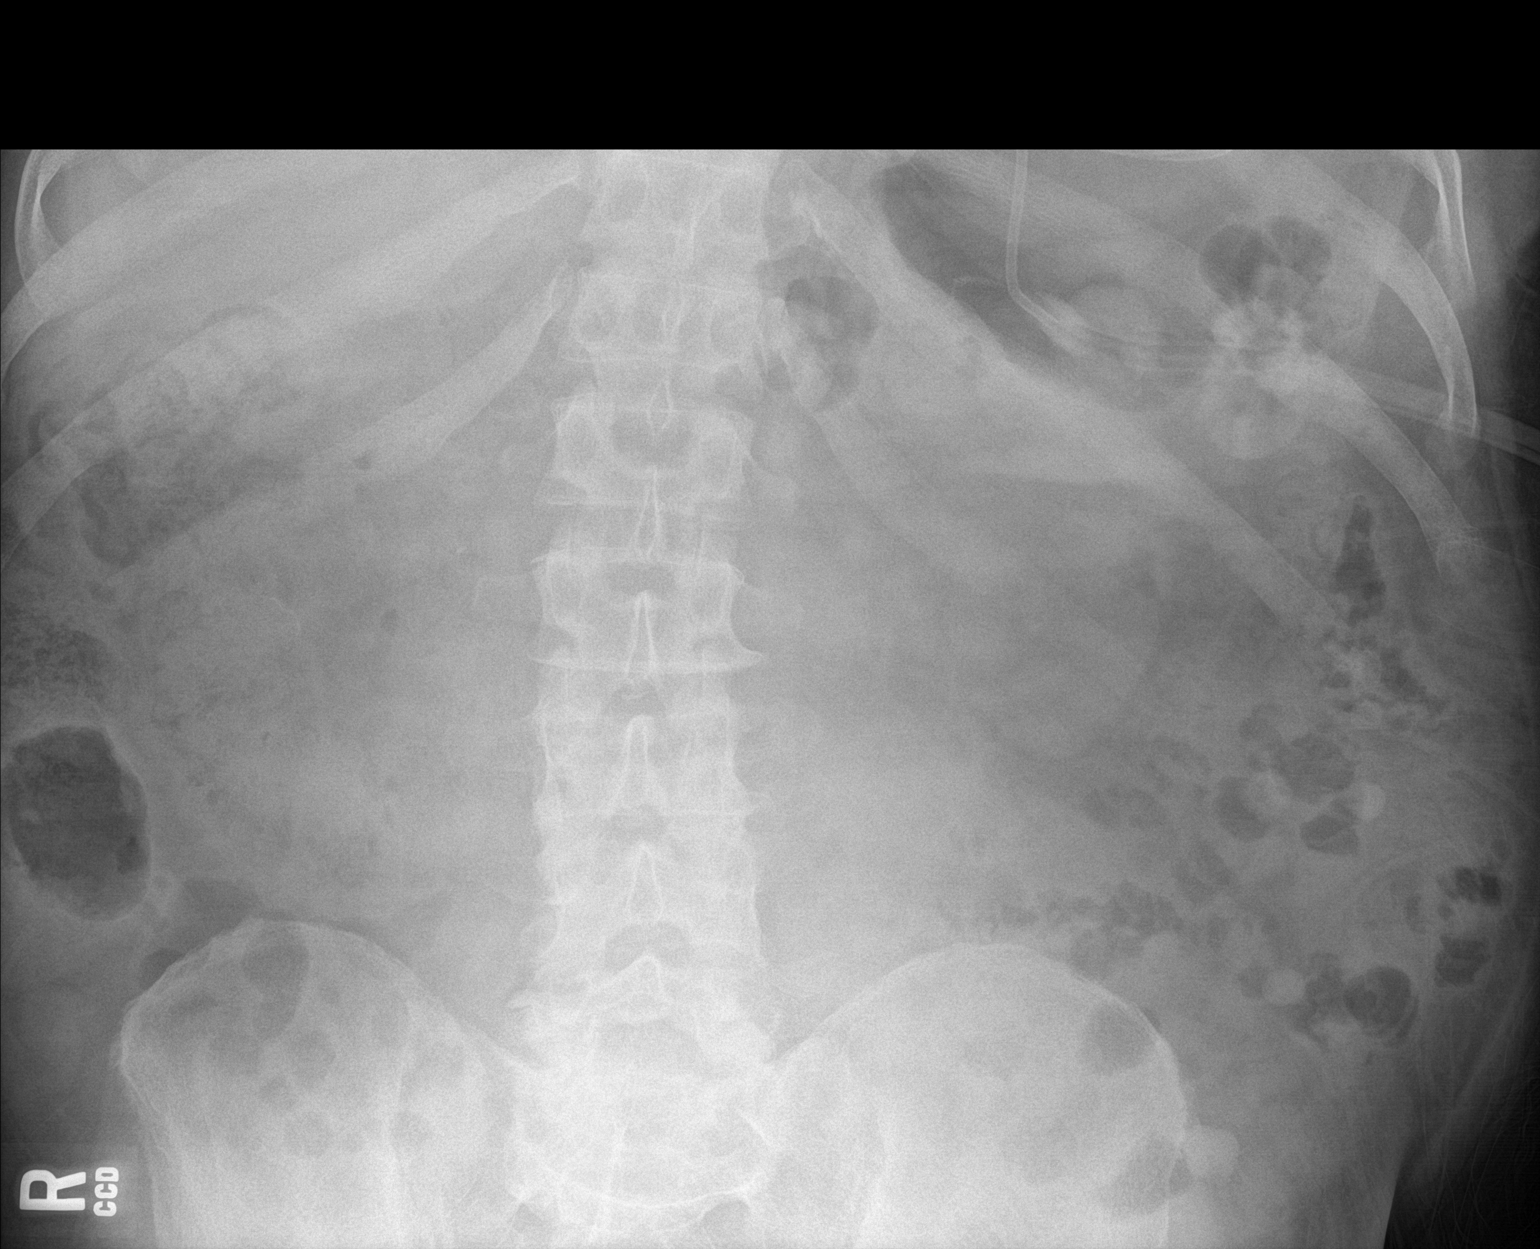

[2 of 2 positions shown; findings below may reference images not displayed]

FINDINGS: [CS] hours. Two views are submitted. Percutaneous gastrojejunostomy
feeding tube is looped in the gastric fundus, no longer extending
into the small bowel. This is unchanged from recent CT. The bowel
gas pattern is nonobstructive. Relative displacement of bowel from
the mid abdomen corresponds with omental infarct demonstrated on
recent CT. Probable contrast retained within descending colonic
diverticula as correlated with prior CT. No suspicious
calcifications. The bones appear unremarkable.
IMPRESSION: No acute findings. The GJ tube remains coiled in the stomach status
post partial gastrectomy.

## 2016-04-16 MED ORDER — ENOXAPARIN SODIUM 30 MG/0.3ML ~~LOC~~ SOLN
30.0000 mg | SUBCUTANEOUS | Status: DC
  Administered 2016-04-16 – 2016-04-21 (×6): 30 mg via SUBCUTANEOUS
  Filled 2016-04-16 (×6): qty 0.3

## 2016-04-16 MED ORDER — ALUM & MAG HYDROXIDE-SIMETH 200-200-20 MG/5ML PO SUSP: 15.0000 mL | Freq: Four times a day (QID) | ORAL | Status: DC | PRN

## 2016-04-16 MED ORDER — DEXTROSE 5 % IV SOLN
20.0000 mg | Freq: Three times a day (TID) | INTRAVENOUS | Status: DC
  Administered 2016-04-16 – 2016-04-21 (×15): 20 mg via INTRAVENOUS
  Filled 2016-04-16 (×7): qty 4
  Filled 2016-04-16 (×2): qty 2
  Filled 2016-04-16 (×2): qty 4
  Filled 2016-04-16: qty 2
  Filled 2016-04-16: qty 4
  Filled 2016-04-16: qty 2
  Filled 2016-04-16 (×2): qty 4

## 2016-04-16 MED ORDER — MORPHINE SULFATE (PF) 2 MG/ML IV SOLN: 2.0000 mg | INTRAVENOUS | Status: DC | PRN

## 2016-04-16 MED ORDER — SODIUM CHLORIDE 0.9 % IV SOLN
INTRAVENOUS | Status: DC
Start: 2016-04-16 — End: 2016-04-20
  Administered 2016-04-16 – 2016-04-17 (×5): via INTRAVENOUS
  Administered 2016-04-19: 1000 mL via INTRAVENOUS
  Administered 2016-04-19: 21:00:00 via INTRAVENOUS

## 2016-04-16 MED ORDER — PANTOPRAZOLE SODIUM 40 MG IV SOLR
40.0000 mg | INTRAVENOUS | Status: DC
Start: 2016-04-16 — End: 2016-04-18
  Administered 2016-04-16 – 2016-04-17 (×2): 40 mg via INTRAVENOUS
  Filled 2016-04-16 (×2): qty 40

## 2016-04-16 NOTE — Clinical Social Work Placement (Addendum)
   CLINICAL SOCIAL WORK PLACEMENT  NOTE  Date:  04/16/2016  Patient Details  Name: Jacob [name] MRN: [id number] Date of Birth: 10/19/1945  Clinical Social Work is seeking post-discharge placement for this patient at the [place] level of care (*CSW will initial, date and re-position this form in  chart as items are completed):  Yes   Patient/family provided with [facility] Work Department's list of facilities offering this level of care within the geographic area requested by the patient (or if unable, by the patient's family).  Yes   Patient/family informed of their freedom to choose among providers that offer the needed level of care, that participate in Medicare, Medicaid or managed care program needed by the patient, have an available bed and are willing to accept the patient.  Yes   Patient/family informed of [facility]'s ownership interest in [facility] and [facility], as well as of the fact that they are under no obligation to receive care at these facilities.  PASRR submitted to EDS on 04/15/16     PASRR number received on 04/15/16     Existing PASRR number confirmed on       FL2 transmitted to all facilities in geographic area requested by pt/family on 04/14/16     FL2 transmitted to all facilities within larger geographic area on       Patient informed that his/her managed care company has contracts with or will negotiate with certain facilities, including the following:  [facility]     Yes   Patient/family informed of bed offers received.  Patient chooses bed at [facility]     Physician recommends and patient chooses bed at      Patient to be transferred to [facility] on  .  Patient to be transferred to facility by [facility]     Patient family notified on   of transfer.  Name of family member notified:        PHYSICIAN Please prepare priority discharge summary, including medications, Please sign FL2      Additional Comment:    _______________________________________________ Lia Hopping, LCSW 04/16/2016, 10:52 AM

## 2016-04-16 NOTE — Care Management Important Message (Signed)
Important Message  Patient Details  Name: Jacob [name] MRN: [id number] Date of Birth: March 24, 1945   Medicare Important Message Given:  Yes    Kerin Salen 04/16/2016, 10:50 AMImportant Message  Patient Details  Name: Jacob [name] MRN: [id number] Date of Birth: 1945-04-21   Medicare Important Message Given:  Yes    Kerin Salen 04/16/2016, 10:50 AM

## 2016-04-16 NOTE — Progress Notes (Signed)
Physical Therapy Treatment Patient Details Name: Jacob [name] MRN: [id number] DOB: [birth year] Today's Date: 04/16/2016    History of Present Illness Pt recently admitted Adenocarcinoma of stomach with distal gastrectomy,stomach drain. Also got  feeding tube.  Pt readmitted at suggestion of HHRN 2* FTT and with AKI and dehydration. PMH:  STage 3 kidney disease, HTN.      PT Comments    Pt able to increase ambulation distance today with o2 sat 91% on room air.  He continues to have some unsteadiness with mobility and con't to recommend SNF.   Follow Up Recommendations  SNF     Equipment Recommendations  None recommended by PT    Recommendations for Other Services       Precautions / Restrictions Precautions Precautions: Fall Precaution Comments: PEG with drain and TF Restrictions Weight Bearing Restrictions: No    Mobility  Bed Mobility Overal bed mobility: Needs Assistance Bed Mobility: Supine to Sit     Supine to sit: Min assist     General bed mobility comments: MIN A to get trunk upright. slow processing at times  Transfers Overall transfer level: Needs assistance Equipment used: Rolling walker (2 wheeled) Transfers: Sit to/from Stand Sit to Stand: Min guard         General transfer comment: good use of UE with standing  Ambulation/Gait Ambulation/Gait assistance: Min guard Ambulation Distance (Feet): 95 Feet (plus 90 after sitting break) Assistive device: Rolling walker (2 wheeled) Gait Pattern/deviations: Step-through pattern;Staggering right Gait velocity: decreased   General Gait Details: Occasional stagger with RW, but overall steady with decreased cadence. o2 91% on room air   Stairs            Wheelchair Mobility    Modified Rankin (Stroke Patients Only)       Balance   Sitting-balance support: No upper extremity supported;Feet supported Sitting balance-Leahy Scale: Good     Standing balance support: Bilateral upper  extremity supported Standing balance-Leahy Scale: Poor Standing balance comment: required UE support                            Cognition Arousal/Alertness: Awake/alert Behavior During Therapy: WFL for tasks assessed/performed Overall Cognitive Status: Within Functional Limits for tasks assessed                                        Exercises      General Comments        Pertinent Vitals/Pain Pain Assessment: No/denies pain    Home Living                      Prior Function            PT Goals (current goals can now be found in the care plan section) Acute Rehab PT Goals PT Goal Formulation: With patient Time For Goal Achievement: 04/23/16 Potential to Achieve Goals: Good Progress towards PT goals: Progressing toward goals    Frequency    Min 3X/week      PT Plan Current plan remains appropriate    Co-evaluation             End of Session Equipment Utilized During Treatment: Gait belt Activity Tolerance: Patient tolerated treatment well;Patient limited by fatigue Patient left: in chair;with call bell/phone within reach Nurse Communication: Mobility status PT Visit Diagnosis: Unsteadiness on feet (  R26.81);Difficulty in walking, not elsewhere classified (R26.2)     Time: 1210-1227 PT Time Calculation (min) (ACUTE ONLY): 17 min  Charges:  $Gait Training: 8-22 mins                    G Codes:       Zelma Mazariego L. Tamala Julian, [place] Pager [phone] 04/16/2016    Galen Manila 04/16/2016, 1:02 PM

## 2016-04-16 NOTE — Progress Notes (Signed)
Patient in bed resting this morning.  Tube feeding restarted this morning.  Patient has no complaints of pain.

## 2016-04-16 NOTE — Progress Notes (Signed)
Patient back in bed resting.  Patient had been up with PT today and then up in chair. Patient had visits from family members.  States that he has no complaints of pain.  Tube feedings via G-Tube continuine to infuse with no problems noted.

## 2016-04-16 NOTE — Progress Notes (Signed)
  Progress Note: General Surgery Service   Subjective: Nauseated overnight, G tube clamped and TF stopped, J tube continues to be clamped.  Objective: Vital signs in last 24 hours: Temp:  [97.4 F (36.3 C)-98.9 F (37.2 C)] 98.9 F (37.2 C) (04/04 0601) Pulse Rate:  [74-78] 74 (04/04 0601) Resp:  [16-18] 18 (04/04 0601) BP: (126-138)/(71-79) 126/71 (04/04 0601) SpO2:  [93 %-98 %] 93 % (04/04 0601) Last BM Date: 04/14/16  Intake/Output from previous day: 04/03 0701 - 04/04 0700 In: 2196.3 [P.O.:240; I.V.:756.3; NG/GT:1200] Out: 1075 [Urine:775; Drains:300] Intake/Output this shift: No intake/output data recorded.  Lungs: CTAB  Cardiovascular: RRR  Abd: soft, NT, ND  Extremities: no edema  Neuro: AOx4  Lab Results: CBC  No results for input(s): WBC, HGB, HCT, PLT in the last 72 hours. BMET  Recent Labs  04/14/16 0449 04/15/16 0400 04/16/16 0453  NA 132* 133*  --   K 4.9 4.9  --   CL 111 111  --   CO2 17* 19*  --   GLUCOSE 107* 136*  --   BUN 22* 26*  --   CREATININE 1.60* 1.59* 1.83*  CALCIUM 10.1 10.2  --    PT/INR No results for input(s): LABPROT, INR in the last 72 hours. ABG No results for input(s): PHART, HCO3 in the last 72 hours.  Invalid input(s): PCO2, PO2  Studies/Results:  Anti-infectives: Anti-infectives    None      Medications: Scheduled Meds: . allopurinol  300 mg Oral Daily  . buPROPion  300 mg Oral Daily  . enoxaparin (LOVENOX) injection  40 mg Subcutaneous Q24H  . feeding supplement (PRO-STAT SUGAR FREE 64)  30 mL Per Tube BID  . free water  100 mL Per Tube Q6H  . gabapentin  800 mg Oral QHS  . nebivolol  20 mg Oral Daily  . tamsulosin  0.4 mg Oral Daily  . traZODone  50 mg Oral QHS  . verapamil  300 mg Oral QHS   Continuous Infusions: . dextrose 5 % and 0.45 % NaCl with KCl 20 mEq/L 75 mL/hr at 04/16/16 0554  . feeding supplement (OSMOLITE 1.5 CAL) Stopped (04/15/16 1841)   PRN Meds:.alum & mag hydroxide-simeth,  diazepam, diphenhydrAMINE **OR** diphenhydrAMINE, morphine injection, ondansetron **OR** ondansetron (ZOFRAN) IV, oxyCODONE, sodium chloride flush  Assessment/Plan: Patient Active Problem List   Diagnosis Date Noted  . Post-operative nausea and vomiting 04/09/2016  . 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 Genetic testing 12/19/2015  . Stomach cancer ([place])   . Gastric adenocarcinoma ([place])   . Gastric mass   . GI bleed 10/26/2015  . Gastrointestinal hemorrhage   . PUD (peptic ulcer disease)   . 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 Nocturnal hypoxemia 11/04/2013  . Noncompliance with treatment 09/02/2013  . Prostate cancer ([place]) 08/14/2011  . Iron deficiency anemia 03/19/2007  . DEPRESSION 03/19/2007  . Essential hypertension 03/19/2007  . NEOPLASM, BENIGN, STOMACH 11/23/2006  . POLYP, COLON 11/23/2006  . INTERNAL HEMORRHOIDS 11/23/2006  . GASTRITIS 11/23/2006   Continued intermittent nausea, +bowel movements, J tube portion coiled in stomach and clogged -continue to feed via G tube -restart IV fluids due to Cr elevation    LOS: 7 days   Mickeal Skinner, MD Pg# [phone] [facility]

## 2016-04-17 LAB — BASIC METABOLIC PANEL
ANION GAP: 4 — AB (ref 5–15)
BUN: 43 mg/dL — ABNORMAL HIGH (ref 6–20)
CALCIUM: 10 mg/dL (ref 8.9–10.3)
CO2: 18 mmol/L — ABNORMAL LOW (ref 22–32)
Chloride: 109 mmol/L (ref 101–111)
Creatinine, Ser: 2.05 mg/dL — ABNORMAL HIGH (ref 0.61–1.24)
GFR calc non Af Amer: 31 mL/min — ABNORMAL LOW (ref >60)
GFR, EST AFRICAN AMERICAN: 36 mL/min — AB (ref >60)
GLUCOSE: 109 mg/dL — AB (ref 65–99)
POTASSIUM: 5.4 mmol/L — AB (ref 3.5–5.1)
Sodium: 131 mmol/L — ABNORMAL LOW (ref 135–145)

## 2016-04-17 LAB — C DIFFICILE QUICK SCREEN W PCR REFLEX
C DIFFICILE (CDIFF) INTERP: NOT DETECTED
C Diff antigen: NEGATIVE
C Diff toxin: NEGATIVE

## 2016-04-17 NOTE — Progress Notes (Signed)
Physical Therapy Treatment Patient Details Name: Jacob [name] MRN: [id number] DOB: 11-26-1945 Today's Date: 04/17/2016    History of Present Illness Pt recently admitted Adenocarcinoma of stomach with distal gastrectomy,stomach drain. Also got  feeding tube.  Pt readmitted at suggestion of HHRN 2* FTT and with AKI and dehydration. PMH:  STage 3 kidney disease, HTN.      PT Comments    Pt limited by diarrhea today and unable to ambulate outside of room.  Con't to recommend SNF.  Follow Up Recommendations  SNF     Equipment Recommendations  None recommended by PT    Recommendations for Other Services       Precautions / Restrictions Precautions Precautions: Fall Precaution Comments: PEG with drain and TF Restrictions Weight Bearing Restrictions: No    Mobility  Bed Mobility Overal bed mobility: Needs Assistance Bed Mobility: Supine to Sit     Supine to sit: Min guard     General bed mobility comments: min/guard to EOB  Transfers Overall transfer level: Needs assistance Equipment used: Rolling walker (2 wheeled) Transfers: Sit to/from Stand Sit to Stand: Min guard         General transfer comment: good use of UE with standing  Ambulation/Gait Ambulation/Gait assistance: Min guard Ambulation Distance (Feet): 10 Feet (x 2) Assistive device: Rolling walker (2 wheeled) Gait Pattern/deviations: Step-through pattern     General Gait Details: Pt stood to ambulate, but then needed to use toilet due to diarrhea. Ambulated to BR and then back afterwards feeling very weak afterwards. Deferred further gait.   Stairs            Wheelchair Mobility    Modified Rankin (Stroke Patients Only)       Balance Overall balance assessment: Needs assistance Sitting-balance support: No upper extremity supported;Feet supported       Standing balance support: Bilateral upper extremity supported Standing balance-Leahy Scale: Poor                               Cognition Arousal/Alertness: Awake/alert Behavior During Therapy: WFL for tasks assessed/performed Overall Cognitive Status: Within Functional Limits for tasks assessed                                        Exercises General Exercises - Lower Extremity Ankle Circles/Pumps: AROM;Both;15 reps;Seated Long Arc Quad: AROM;Both;10 reps;Seated Hip ABduction/ADduction: AROM;Both;15 reps;Seated Hip Flexion/Marching: AROM;Both;15 reps;Seated    General Comments        Pertinent Vitals/Pain Pain Assessment: No/denies pain    Home Living                      Prior Function            PT Goals (current goals can now be found in the care plan section) Acute Rehab PT Goals PT Goal Formulation: With patient Time For Goal Achievement: 04/23/16 Potential to Achieve Goals: Good Progress towards PT goals: Progressing toward goals    Frequency    Min 3X/week      PT Plan Current plan remains appropriate    Co-evaluation             End of Session Equipment Utilized During Treatment: Gait belt Activity Tolerance: Patient tolerated treatment well;Treatment limited secondary to medical complications (Comment);Other (comment) (diarrhea) Patient left: in chair;with call bell/phone within reach  Nurse Communication: Mobility status PT Visit Diagnosis: Unsteadiness on feet (R26.81);Difficulty in walking, not elsewhere classified (R26.2)     Time: 6767-2094 PT Time Calculation (min) (ACUTE ONLY): 29 min  Charges:  $Gait Training: 8-22 mins $Therapeutic Exercise: 8-22 mins                    G Codes:       Ren Grasse L. Tamala Julian, [place] Pager [phone] 04/17/2016    Galen Manila 04/17/2016, 3:36 PM

## 2016-04-17 NOTE — Progress Notes (Signed)
Nutrition Follow-up  DOCUMENTATION CODES:   Obesity unspecified  INTERVENTION:   Once patient is able to tolerate feeds via G-tube portion of PEG-J for 24 hours, will initiate night feed regimen. Recommend infusing Osmolite 1.5 @ 70 ml/hr over 14 hours to begin transition. Increase by 10 ml every 24 hours.   Recommended night feed regimen: Osmolite 1.5 @ 105 ml/hr x 14 hours with 30 ml Prostat BID to provide 2405 kcal, 122g protein and 1120 ml H2O.  RD will continue to monitor  NUTRITION DIAGNOSIS:   Inadequate oral intake related to altered GI function as evidenced by other (see comment), meal completion < 25% (patient with PEJ on tube feeds).  Ongoing.  GOAL:   Patient will meet greater than or equal to 90% of their needs  Inconsistently met.  MONITOR:   PO intake, TF tolerance, Labs, Weight trends  ASSESSMENT:   71 yo male - patient of Dr. 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  He had a G-J tube placed at surgery.  There have been problems with the tube post-op and the tube was changed recently.  He was discharged on 04/07/16 with a functioning tube, but had persistent nausea at home.  He had some vomiting, diarrhea, and some abdominal pain.  The family is concerned that they are not able to manage him at home, so they returned to the ED.  He may need SNF placement.  RD planned to transition patient to night feed regimen. Per RN, pt did not receive Osmolite 1.5 via G-tube yesterday despite note being written that feeds had been started. TF was turned off. TF was restarted this morning. Reviewed plan to have TF run all day today at 60 ml/hr with 30 ml Prostat BID.  If tolerates, will initiate night feeds tomorrow 4/6. Will increase Osmolite 1.5 to 70 ml/hr over 14 hours. Goal will be to increase every 24 hours to 105 ml/hr x 14 hours.   Will continue to monitor for needs.  Medications: IV Reglan every 8 hours, IV Protonix daily Labs  reviewed: Low Na Elevated K GFR: 36  Diet Order:  Diet full liquid Room service appropriate? Yes; Fluid consistency: Thin  Skin:  Wound (see comment) (recent abdominal incision)  Last BM:  4/5  Height:   Ht Readings from Last 1 Encounters:  04/08/16 6' (1.829 m)    Weight:   Wt Readings from Last 1 Encounters:  04/08/16 246 lb (111.6 kg)    Ideal Body Weight:  80.9 kg  BMI:  Body mass index is 33.36 kg/m.  Estimated Nutritional Needs:   Kcal:  2200-2500kcal/day   Protein:  123-145g/day   Fluid:  >2.2L/day   EDUCATION NEEDS:   No education needs identified at this time  Jacob Bibles, MS, RD, LDN Pager: [phone] After Hours Pager: [phone]

## 2016-04-17 NOTE — Care Management Note (Signed)
Case Management Note  Patient Details  Name: Jacob [name] MRN: [id number] Date of Birth: 1945-02-09  Subjective/Objective:Concerns are  if patient is tolerating tube feeds. On GT-Tube feeds. J tube clogged. ivf,iv protonix,iv reglan. D/c plan SNF.                   Action/Plan:d/c SNF.   Expected Discharge Date:                  Expected Discharge Plan:  Skilled Nursing Facility  In-House Referral:  Clinical Social Work  Discharge planning Services  CM Consult  Post Acute Care Choice:    Choice offered to:     DME Arranged:    DME Agency:     HH Arranged:    [place] Agency:     Status of Service:  In process, will continue to follow  If discussed at Long Length of Stay Meetings, dates discussed:    Additional Comments:  Dessa Phi, RN 04/17/2016, 3:38 PM

## 2016-04-17 NOTE — Progress Notes (Signed)
Dr. Kieth Brightly called this am 8:20 to check on patient and tube feed. I was in patients room and gave him update from night shift and my assessment. Dr expressed his frustration about tube feeding stopped and gave instructions to start tube feed and flushes through G tube as J tube was clogged. Tube feed started and orders put in appropriately.

## 2016-04-17 NOTE — Progress Notes (Signed)
Pt on C diff precautions due to frequent diarrheal stools. Sent stool sample to lab to be tested, it was neg for cdiff.  Removed pt from precautions.

## 2016-04-18 LAB — BASIC METABOLIC PANEL
Anion gap: 3 — ABNORMAL LOW (ref 5–15)
BUN: 41 mg/dL — AB (ref 6–20)
CHLORIDE: 112 mmol/L — AB (ref 101–111)
CO2: 18 mmol/L — ABNORMAL LOW (ref 22–32)
Calcium: 9.7 mg/dL (ref 8.9–10.3)
Creatinine, Ser: 1.73 mg/dL — ABNORMAL HIGH (ref 0.61–1.24)
GFR, EST AFRICAN AMERICAN: 44 mL/min — AB (ref >60)
GFR, EST NON AFRICAN AMERICAN: 38 mL/min — AB (ref >60)
Glucose, Bld: 126 mg/dL — ABNORMAL HIGH (ref 65–99)
POTASSIUM: 5 mmol/L (ref 3.5–5.1)
SODIUM: 133 mmol/L — AB (ref 135–145)

## 2016-04-18 LAB — GASTROINTESTINAL PANEL BY PCR, STOOL (REPLACES STOOL CULTURE)
ASTROVIRUS: NOT DETECTED
Adenovirus F40/41: NOT DETECTED
CAMPYLOBACTER SPECIES: NOT DETECTED
Cryptosporidium: NOT DETECTED
Cyclospora cayetanensis: NOT DETECTED
ENTAMOEBA HISTOLYTICA: NOT DETECTED
ENTEROTOXIGENIC E COLI (ETEC): NOT DETECTED
Enteroaggregative E coli (EAEC): NOT DETECTED
Enteropathogenic E coli (EPEC): NOT DETECTED
Giardia lamblia: NOT DETECTED
NOROVIRUS GI/GII: NOT DETECTED
Plesimonas shigelloides: NOT DETECTED
Rotavirus A: NOT DETECTED
SALMONELLA SPECIES: NOT DETECTED
SHIGELLA/ENTEROINVASIVE E COLI (EIEC): NOT DETECTED
Sapovirus (I, II, IV, and V): NOT DETECTED
Shiga like toxin producing E coli (STEC): NOT DETECTED
VIBRIO CHOLERAE: NOT DETECTED
Vibrio species: NOT DETECTED
Yersinia enterocolitica: NOT DETECTED

## 2016-04-18 MED ORDER — FREE WATER
100.0000 mL | Status: DC
  Administered 2016-04-18 – 2016-04-21 (×17): 100 mL

## 2016-04-18 MED ORDER — OSMOLITE 1.5 CAL PO LIQD
1000.0000 mL | ORAL | Status: AC
  Administered 2016-04-18: 1000 mL
  Filled 2016-04-18: qty 1000

## 2016-04-18 MED ORDER — CALCIUM POLYCARBOPHIL 625 MG PO TABS
625.0000 mg | ORAL_TABLET | Freq: Every day | ORAL | Status: DC
  Administered 2016-04-18 – 2016-04-21 (×4): 625 mg via ORAL
  Filled 2016-04-18 (×4): qty 1

## 2016-04-18 MED ORDER — PANTOPRAZOLE SODIUM 40 MG PO PACK
40.0000 mg | PACK | Freq: Every day | ORAL | Status: DC
Start: 2016-04-18 — End: 2016-04-19
  Administered 2016-04-18: 40 mg
  Filled 2016-04-18 (×3): qty 20

## 2016-04-18 NOTE — Progress Notes (Addendum)
Nutrition Follow-up  DOCUMENTATION CODES:   Obesity unspecified  INTERVENTION:   Begin transition to overnight tube feeds via G-tube portion of PEG-J at 1800 tonight  Recommend infusing Osmolite 1.5 @ 70 ml/hr over 14 hours to begin transition. Increase by 10 ml every 24 hours.   Recommended night feed regimen at goal: Osmolite 1.5 @ 105 ml/hr x 14 hours with 30 ml Prostat BID to provide 2405 kcal, 122g protein and 1120 ml H2O.  Free water flushes 165m every 4 hrs to help prevent tube clog: Flushes along with tube feeds at goal will provide patient with 17251mday water  RD will continue to monitor  NUTRITION DIAGNOSIS:   Inadequate oral intake related to altered GI function as evidenced by other (see comment), meal completion < 25% (patient with PEJ on tube feeds).  Ongoing.  GOAL:   Patient will meet greater than or equal to 90% of their needs  Inconsistently met.  MONITOR:   PO intake, TF tolerance, Labs, Weight trends  ASSESSMENT:   Jacob [name] male - patient of Dr. ByBarry Dienes s/p Billroth 2 gastrojejunostomy on 03/25/16 for antral cancer.  He had a G-J tube placed at surgery.  There have been problems with the tube post-op and the tube was changed recently.  He was discharged on 04/07/16 with a functioning tube, but had persistent nausea at home.  He had some vomiting, diarrhea, and some abdominal pain.  The family is concerned that they are not able to manage him at home, so they returned to the ED.  He may need SNF placement.  Pt tolerated tube feeds well overnight via G-tube portion of PEG-J. RD will begin transition to nocturnal feeds and initiate tube feeds via PEG at 7068mr x 14 hrs from 1800-0800. RD spoke with RN about transition of tube feeds. Pt eating 25% of full liquid diet. Pt continues to have diarrhea but reports that it is less frequent. Pt may benefit from fiber supplement. RD weighed pt in bed today; pt appears to be weight stable. RD discussed with pt transition  to nocturnal feeds. Pt excited to transition to nocturnal feeds as he hopes this will increase his appetite during the day. Pt will need to follow up with [facility] LDN at the cancer center after discharge to manage tube feeds.   Will increase Osmolite 1.5 to 70 ml/hr over 14 hours. Goal will be to increase every 24 hours to 105 ml/hr x 14 hours.   Will continue to monitor for needs.  Medications reviewed and include: allopurinol, lovenox, reglan, protonix, oxycodone   Labs reviewed: Na 133(L), Cl 112(H), BUN 41(H), creat 1.73(H) Hgb 8.2(L), Hct 24.6(L)  Diet Order:  Diet full liquid Room service appropriate? Yes; Fluid consistency: Thin  Skin:  Wound (see comment) (recent abdominal incision)  Last BM:  4/6- diarrhea   Height:   Ht Readings from Last 1 Encounters:  04/08/16 6' (1.829 m)    Weight:   Wt Readings from Last 1 Encounters:  04/18/16 246 lb 8 oz (111.8 kg)    Ideal Body Weight:  80.9 kg  BMI:  Body mass index is 33.43 kg/m.  Estimated Nutritional Needs:   Kcal:  2200-2500kcal/day   Protein:  123-145g/day   Fluid:  >2.2L/day   EDUCATION NEEDS:   No education needs identified at this time  CasKoleen DistanceD, LDN Pager #- [phone]

## 2016-04-18 NOTE — Progress Notes (Signed)
  Progress Note: General Surgery Service   Subjective: Tube feeds again turned off overnight, no nausea this morning, diarrhea 4x overnight  Objective: Vital signs in last 24 hours: Temp:  [97.8 F (36.6 C)-98.1 F (36.7 C)] 98 F (36.7 C) (04/06 0507) Pulse Rate:  [67-79] 75 (04/06 0507) Resp:  [18] 18 (04/06 0507) BP: (115-154)/(62-79) 138/66 (04/06 0507) SpO2:  [98 %-100 %] 99 % (04/06 0507) Last BM Date: 04/17/16  Intake/Output from previous day: 04/05 0701 - 04/06 0700 In: 4820 [P.O.:480; I.V.:2410; UT/ML:4650; IV Piggyback:324] Out: 1776 [Urine:1775; Stool:1] Intake/Output this shift: No intake/output data recorded.  Lungs: CTAB  Cardiovascular: RRR  Abd: soft, Nt, ND  Extremities: no edema  Neuro: AOx4  Lab Results: CBC  No results for input(s): WBC, HGB, HCT, PLT in the last 72 hours. BMET  Recent Labs  04/17/16 0834 04/18/16 0544  NA 131* 133*  K 5.4* 5.0  CL 109 112*  CO2 18* 18*  GLUCOSE 109* 126*  BUN 43* 41*  CREATININE 2.05* 1.73*  CALCIUM 10.0 9.7   PT/INR No results for input(s): LABPROT, INR in the last 72 hours. ABG No results for input(s): PHART, HCO3 in the last 72 hours.  Invalid input(s): PCO2, PO2  Studies/Results:  Anti-infectives: Anti-infectives    None      Medications: Scheduled Meds: . allopurinol  300 mg Oral Daily  . buPROPion  300 mg Oral Daily  . enoxaparin (LOVENOX) injection  30 mg Subcutaneous Q24H  . feeding supplement (PRO-STAT SUGAR FREE 64)  30 mL Per Tube BID  . gabapentin  800 mg Oral QHS  . metoCLOPramide (REGLAN) injection  20 mg Intravenous Q8H  . nebivolol  20 mg Oral Daily  . pantoprazole (PROTONIX) IV  40 mg Intravenous Q24H  . tamsulosin  0.4 mg Oral Daily  . traZODone  50 mg Oral QHS  . verapamil  300 mg Oral QHS   Continuous Infusions: . sodium chloride 100 mL/hr at 04/17/16 2150  . feeding supplement (OSMOLITE 1.5 CAL) 1,000 mL (04/17/16 2238)   PRN Meds:.diazepam, diphenhydrAMINE  **OR** diphenhydrAMINE, morphine injection, ondansetron **OR** ondansetron (ZOFRAN) IV, oxyCODONE, sodium chloride flush  Assessment/Plan: Patient Active Problem List   Diagnosis Date Noted  . Post-operative nausea and vomiting 04/09/2016  . 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 Genetic testing 12/19/2015  . Stomach cancer ([place])   . Gastric adenocarcinoma ([place])   . Gastric mass   . GI bleed 10/26/2015  . Gastrointestinal hemorrhage   . PUD (peptic ulcer disease)   . 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 Nocturnal hypoxemia 11/04/2013  . Noncompliance with treatment 09/02/2013  . Prostate cancer ([place]) 08/14/2011  . Iron deficiency anemia 03/19/2007  . DEPRESSION 03/19/2007  . Essential hypertension 03/19/2007  . NEOPLASM, BENIGN, STOMACH 11/23/2006  . POLYP, COLON 11/23/2006  . INTERNAL HEMORRHOIDS 11/23/2006  . GASTRITIS 11/23/2006   Bilroth II, GJ tube coiled in stomach, seems to tolerate G tube feeds -continue feeding through G tube -stool studies for diarrhea     LOS: 9 days   Mickeal Skinner, MD Pg# [phone] [facility]

## 2016-04-18 NOTE — Progress Notes (Signed)
The patient is receiving Protonix by the intravenous route.  Based on criteria approved by the Pharmacy and [place], the medication is being converted to the equivalent oral dose form.  These criteria include: -No active GI bleeding -Able to tolerate diet of full liquids (or better) or tube feeding -Able to tolerate other medications by the oral or enteral route  If you have any questions about this conversion, please contact the Pharmacy Department (phone [phone]  Thank you.  Minda Ditto PharmD Pager [phone] 04/18/2016, 10:33 AM

## 2016-04-18 NOTE — Progress Notes (Signed)
  Progress Note: General Surgery Service   Subjective: No events overnight, diarrhea multiple times overnight  Objective: Vital signs in last 24 hours: Temp:  [97.8 F (36.6 C)-98.1 F (36.7 C)] 98 F (36.7 C) (04/06 0507) Pulse Rate:  [67-79] 75 (04/06 0507) Resp:  [18] 18 (04/06 0507) BP: (115-154)/(62-79) 138/66 (04/06 0507) SpO2:  [98 %-100 %] 99 % (04/06 0507) Last BM Date: 04/17/16  Intake/Output from previous day: 04/05 0701 - 04/06 0700 In: 4820 [P.O.:480; I.V.:2410; AF/BX:0383; IV Piggyback:324] Out: 1776 [Urine:1775; Stool:1] Intake/Output this shift: No intake/output data recorded.  Lungs: CTAB  Cardiovascular: RRR  Abd: soft, NT, ND  Extremities: no edema  Neuro: AOx4  Lab Results: CBC  No results for input(s): WBC, HGB, HCT, PLT in the last 72 hours. BMET  Recent Labs  04/17/16 0834 04/18/16 0544  NA 131* 133*  K 5.4* 5.0  CL 109 112*  CO2 18* 18*  GLUCOSE 109* 126*  BUN 43* 41*  CREATININE 2.05* 1.73*  CALCIUM 10.0 9.7   PT/INR No results for input(s): LABPROT, INR in the last 72 hours. ABG No results for input(s): PHART, HCO3 in the last 72 hours.  Invalid input(s): PCO2, PO2  Studies/Results:  Anti-infectives: Anti-infectives    None      Medications: Scheduled Meds: . allopurinol  300 mg Oral Daily  . buPROPion  300 mg Oral Daily  . enoxaparin (LOVENOX) injection  30 mg Subcutaneous Q24H  . feeding supplement (PRO-STAT SUGAR FREE 64)  30 mL Per Tube BID  . gabapentin  800 mg Oral QHS  . metoCLOPramide (REGLAN) injection  20 mg Intravenous Q8H  . nebivolol  20 mg Oral Daily  . pantoprazole (PROTONIX) IV  40 mg Intravenous Q24H  . tamsulosin  0.4 mg Oral Daily  . traZODone  50 mg Oral QHS  . verapamil  300 mg Oral QHS   Continuous Infusions: . sodium chloride 100 mL/hr at 04/17/16 2150  . feeding supplement (OSMOLITE 1.5 CAL) 1,000 mL (04/17/16 2238)   PRN Meds:.diazepam, diphenhydrAMINE **OR** diphenhydrAMINE,  morphine injection, ondansetron **OR** ondansetron (ZOFRAN) IV, oxyCODONE, sodium chloride flush  Assessment/Plan: Patient Active Problem List   Diagnosis Date Noted  . Post-operative nausea and vomiting 04/09/2016  . 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 Genetic testing 12/19/2015  . Stomach cancer ([place])   . Gastric adenocarcinoma ([place])   . Gastric mass   . GI bleed 10/26/2015  . Gastrointestinal hemorrhage   . PUD (peptic ulcer disease)   . 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 Nocturnal hypoxemia 11/04/2013  . Noncompliance with treatment 09/02/2013  . Prostate cancer ([place]) 08/14/2011  . Iron deficiency anemia 03/19/2007  . DEPRESSION 03/19/2007  . Essential hypertension 03/19/2007  . NEOPLASM, BENIGN, STOMACH 11/23/2006  . POLYP, COLON 11/23/2006  . INTERNAL HEMORRHOIDS 11/23/2006  . GASTRITIS 11/23/2006   Tolerating G tube feeds, Cr down, Na up, C diff negative -add benefiber to supplementation -half rate IV fluids    LOS: 9 days   Mickeal Skinner, MD Pg# [phone] [facility]

## 2016-04-19 LAB — BASIC METABOLIC PANEL
Anion gap: 3 — ABNORMAL LOW (ref 5–15)
BUN: 32 mg/dL — ABNORMAL HIGH (ref 6–20)
CHLORIDE: 112 mmol/L — AB (ref 101–111)
CO2: 20 mmol/L — AB (ref 22–32)
Calcium: 9.9 mg/dL (ref 8.9–10.3)
Creatinine, Ser: 1.63 mg/dL — ABNORMAL HIGH (ref 0.61–1.24)
GFR calc non Af Amer: 41 mL/min — ABNORMAL LOW (ref >60)
GFR, EST AFRICAN AMERICAN: 48 mL/min — AB (ref >60)
Glucose, Bld: 116 mg/dL — ABNORMAL HIGH (ref 65–99)
POTASSIUM: 5 mmol/L (ref 3.5–5.1)
SODIUM: 135 mmol/L (ref 135–145)

## 2016-04-19 MED ORDER — PANTOPRAZOLE SODIUM 40 MG PO TBEC
40.0000 mg | DELAYED_RELEASE_TABLET | Freq: Every day | ORAL | Status: DC
  Administered 2016-04-19 – 2016-04-21 (×3): 40 mg via ORAL
  Filled 2016-04-19 (×3): qty 1

## 2016-04-19 MED ORDER — OSMOLITE 1.5 CAL PO LIQD
1000.0000 mL | ORAL | Status: DC
  Administered 2016-04-19: 1000 mL
  Filled 2016-04-19 (×2): qty 1000

## 2016-04-19 NOTE — Progress Notes (Signed)
[facility] Office:  [phone] General Surgery Progress Note   LOS: 10 days  POD -     Chief Complaint: Nausea better  Assessment and Plan: 1.  Diagnostic laparoscopy, distal gastrectomy with Billroth 2 reconstruction and gastrojejunostomy tube - 03/25/2016 - [facility]  For T3N0 adenoca of stomach  Plan to advance to reg diet and see how he does  He will continue night TF   2.  DVT prophylaxis - Lovenox 3.  Elevated Creatinine  Creatinine - 1.63 - 04/19/2016   Active Problems:   Post-operative nausea and vomiting  Subjective:  Feeling better  Objective:   Vitals:   04/18/16 2034 04/19/16 0612  BP: (!) 146/83 130/72  Pulse: 77 71  Resp: 20 18  Temp: 97.6 F (36.4 C) 97.7 F (36.5 C)     Intake/Output from previous day:  04/06 0701 - 04/07 0700 In: 4098 [P.O.:840; I.V.:1339.2; OZ/HY:8657.8; IV Piggyback:163] Out: 4696 [Urine:3075]  Intake/Output this shift:  No intake/output data recorded.   Physical Exam:   General: Older AA M who is alert and oriented.    HEENT: Normal. Pupils equal. .   Lungs: Clear   Abdomen: Soft - gastrostomy tube in LUQ - incision okay   Lab Results:   No results for input(s): WBC, HGB, HCT, PLT in the last 72 hours.  BMET   Recent Labs  04/18/16 0544 04/19/16 0451  NA 133* 135  K 5.0 5.0  CL 112* 112*  CO2 18* 20*  GLUCOSE 126* 116*  BUN 41* 32*  CREATININE 1.73* 1.63*  CALCIUM 9.7 9.9    PT/INR  No results for input(s): LABPROT, INR in the last 72 hours.  ABG  No results for input(s): PHART, HCO3 in the last 72 hours.  Invalid input(s): PCO2, PO2   Studies/Results:  No results found.   Anti-infectives:   Anti-infectives    None      Alphonsa Overall, MD, FACS Pager: [phone] Surgery Office: [phone] 04/19/2016

## 2016-04-20 LAB — BASIC METABOLIC PANEL
ANION GAP: 3 — AB (ref 5–15)
BUN: 30 mg/dL — ABNORMAL HIGH (ref 6–20)
CHLORIDE: 111 mmol/L (ref 101–111)
CO2: 20 mmol/L — ABNORMAL LOW (ref 22–32)
Calcium: 10.1 mg/dL (ref 8.9–10.3)
Creatinine, Ser: 1.59 mg/dL — ABNORMAL HIGH (ref 0.61–1.24)
GFR calc Af Amer: 49 mL/min — ABNORMAL LOW (ref >60)
GFR, EST NON AFRICAN AMERICAN: 42 mL/min — AB (ref >60)
Glucose, Bld: 117 mg/dL — ABNORMAL HIGH (ref 65–99)
POTASSIUM: 5 mmol/L (ref 3.5–5.1)
SODIUM: 134 mmol/L — AB (ref 135–145)

## 2016-04-20 MED ORDER — ENSURE ENLIVE PO LIQD
237.0000 mL | Freq: Three times a day (TID) | ORAL | Status: DC
  Administered 2016-04-21 (×2): 237 mL via ORAL

## 2016-04-20 NOTE — Progress Notes (Addendum)
.  [facility] Surgery Office:  [phone] General Surgery Progress Note   LOS: 11 days  POD -     Chief Complaint: Tube feedings are giving him diarrhea  Assessment and Plan: 1.  Diagnostic laparoscopy, distal gastrectomy with Billroth 2 reconstruction and gastrojejunostomy tube - 03/25/2016 - [facility]  For T3N0 adenoca of stomach  Stop tube feedings - in that it looks like he is doing well enough with reg food.  On IV reglan - to switch to po   2.  DVT prophylaxis - Lovenox 3.  Elevated Creatinine  Creatinine - 1.63 - 04/19/2016   Active Problems:   Post-operative nausea and vomiting  Subjective:  Some diarrhea from tube feedings. Unsure where he is gong from here - he is married, but his wife is blind, so she has her issues.  There has been mention of [facility] for rehab.  Objective:   Vitals:   04/19/16 2110 04/20/16 0511  BP: (!) 154/85 (!) 160/86  Pulse: 81 81  Resp: 17 16  Temp: 98.2 F (36.8 C) 98.5 F (36.9 C)     Intake/Output from previous day:  04/07 0701 - 04/08 0700 In: 2820.5 [P.O.:660; I.V.:1210; NG/GT:950.5] Out: 1625 [Urine:1625]  Intake/Output this shift:  No intake/output data recorded.   Physical Exam:   General: Older AA M who is alert and oriented.    HEENT: Normal. Pupils equal. .   Lungs: Clear   Abdomen: Soft, BS present - gastrostomy tube in LUQ - incision okay   Lab Results:   No results for input(s): WBC, HGB, HCT, PLT in the last 72 hours.  BMET    Recent Labs  04/19/16 0451 04/20/16 0422  NA 135 134*  K 5.0 5.0  CL 112* 111  CO2 20* 20*  GLUCOSE 116* 117*  BUN 32* 30*  CREATININE 1.63* 1.59*  CALCIUM 9.9 10.1    PT/INR  No results for input(s): LABPROT, INR in the last 72 hours.  ABG  No results for input(s): PHART, HCO3 in the last 72 hours.  Invalid input(s): PCO2, PO2   Studies/Results:  No results found.   Anti-infectives:   Anti-infectives    None      Alphonsa Overall, MD, FACS Pager:  [phone] Surgery Office: [phone] 04/20/2016

## 2016-04-20 NOTE — Progress Notes (Signed)
Nutrition Follow-up  DOCUMENTATION CODES:   Obesity unspecified  INTERVENTION:   TF stopped per surgery  Provide Ensure Enlive po TID, each supplement provides 350 kcal and 20 grams of protein Encourage PO intake RD to continue to monitor  NUTRITION DIAGNOSIS:   Inadequate oral intake related to altered GI function as evidenced by other (see comment), meal completion < 25% (patient with PEJ on tube feeds).  Improving.  GOAL:   Patient will meet greater than or equal to 90% of their needs  Progressing.  MONITOR:   PO intake, TF tolerance, Labs, Weight trends  ASSESSMENT:   71 yo male - patient of Dr. 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  He had a G-J tube placed at surgery.  There have been problems with the tube post-op and the tube was changed recently.  He was discharged on 04/07/16 with a functioning tube, but had persistent nausea at home.  He had some vomiting, diarrhea, and some abdominal pain.  The family is concerned that they are not able to manage him at home, so they returned to the ED.  He may need SNF placement.  Per surgery note, TF stopped d/t improved PO intake.  Pt states he has not eaten much in the past 24 hours d/t diarrhea. Pt ordered some lunch (chicken breast, mashed potatoes and tea). Will monitor PO intakes. If PO intakes do not improve, will need to restart night feeds. Pt is willing to drink Ensure supplements to provide additional kcal and protein.  Medications: Free water flushes every 4 hours, IV Reglan every 8 hours, Protonix tablet daily, Fibercon tablet daily Labs reviewed: Low Na GFR: 49  Diet Order:  Diet regular Room service appropriate? Yes; Fluid consistency: Thin  Skin:  Wound (see comment) (recent abdominal incision)  Last BM:  4/6- diarrhea   Height:   Ht Readings from Last 1 Encounters:  04/08/16 6' (1.829 m)    Weight:   Wt Readings from Last 1 Encounters:  04/18/16 246 lb 8 oz  (111.8 kg)    Ideal Body Weight:  80.9 kg  BMI:  Body mass index is 33.43 kg/m.  Estimated Nutritional Needs:   Kcal:  2200-2500kcal/day   Protein:  123-145g/day   Fluid:  >2.2L/day   EDUCATION NEEDS:   No education needs identified at this time  Clayton Bibles, MS, RD, LDN Pager: [phone] After Hours Pager: [phone]

## 2016-04-21 MED ORDER — CALCIUM POLYCARBOPHIL 625 MG PO TABS: 625.0000 mg | ORAL_TABLET | Freq: Every day | ORAL | 3 refills | Status: DC

## 2016-04-21 MED ORDER — ONDANSETRON 4 MG PO TBDP: 4.0000 mg | ORAL_TABLET | Freq: Four times a day (QID) | ORAL | 3 refills | Status: DC | PRN

## 2016-04-21 MED ORDER — OXYCODONE HCL 5 MG PO TABS: 5.0000 mg | ORAL_TABLET | Freq: Four times a day (QID) | ORAL | 0 refills | Status: DC | PRN

## 2016-04-21 MED ORDER — FREE WATER: 100.0000 mL | 0 refills | Status: DC

## 2016-04-21 MED ORDER — PANTOPRAZOLE SODIUM 40 MG PO TBEC: 40.0000 mg | DELAYED_RELEASE_TABLET | Freq: Every day | ORAL | 3 refills | Status: DC

## 2016-04-21 MED ORDER — METOCLOPRAMIDE HCL 5 MG/5ML PO SOLN: 10.0000 mg | Freq: Three times a day (TID) | ORAL | 0 refills | Status: DC

## 2016-04-21 MED ORDER — ENSURE ENLIVE PO LIQD: 237.0000 mL | Freq: Three times a day (TID) | ORAL | 12 refills | Status: DC

## 2016-04-21 MED ORDER — HEPARIN SOD (PORK) LOCK FLUSH 100 UNIT/ML IV SOLN
500.0000 [IU] | INTRAVENOUS | Status: AC | PRN
  Administered 2016-04-21: 500 [IU]

## 2016-04-21 MED ORDER — METOCLOPRAMIDE HCL 5 MG/5ML PO SOLN
10.0000 mg | Freq: Three times a day (TID) | ORAL | Status: DC
  Administered 2016-04-21: 10 mg via ORAL
  Filled 2016-04-21 (×2): qty 10

## 2016-04-21 NOTE — Progress Notes (Signed)
Report given to [facility] room [phone]. All questions were answered.

## 2016-04-21 NOTE — Progress Notes (Signed)
Nutrition Brief Follow-up  Patient's PO intake continues to improve. 4/8: PO intakes ranged from 30-50% (~300 kcal, 11g protein) 4/9: Ate 100% of breakfast meal of boiled egg, toast and coffee (241 kcal, 8g protein). Drinking Ensure supplements today. Ordered for TID.   Noted that pt may discharge to SNF today.  Clayton Bibles, MS, RD, LDN Pager: [phone] After Hours Pager: [phone]

## 2016-04-21 NOTE — Discharge Instructions (Signed)
[facility]      [facility] °[phone] ° °ABDOMINAL SURGERY: POST OP INSTRUCTIONS ° °Always review your discharge instruction sheet given to you by the facility where your surgery was performed. ° °IF YOU HAVE DISABILITY OR FAMILY LEAVE FORMS, YOU MUST BRING THEM TO THE OFFICE FOR PROCESSING.  PLEASE DO NOT GIVE THEM TO YOUR DOCTOR. ° °1. A prescription for pain medication may be given to you upon discharge.  Take your pain medication as prescribed, if needed.  If narcotic pain medicine is not needed, then you may take acetaminophen (Tylenol) or ibuprofen (Advil) as needed. °2. Take your usually prescribed medications unless otherwise directed. °3. If you need a refill on your pain medication, please contact your pharmacy. They will contact our office to request authorization.  Prescriptions will not be filled after 5pm or on week-ends. °4. You should follow a light diet the first few days after arrival home, such as soup and crackers, pudding, etc.unless your doctor has advised otherwise. A high-fiber, low fat diet can be resumed as tolerated.   Be sure to include lots of fluids daily. Most patients will experience some swelling and bruising on the chest and neck area.  Ice packs will help.  Swelling and bruising can take several days to resolve °5. Most patients will experience some swelling and bruising in the area of the incision. Ice pack will help. Swelling and bruising can take several days to resolve..  °6. It is common to experience some constipation if taking pain medication after surgery.  Increasing fluid intake and taking a stool softener will usually help or prevent this problem from occurring.  A mild laxative (Milk of Magnesia or Miralax) should be taken according to package directions if there are no bowel movements after 48 hours. °7.  You may have steri-strips (small skin tapes) in place directly over the incision.  These strips should be left on the skin for 10-14 days.  If your  surgeon used skin glue on the incision, you may shower in 48 hours.  The glue will flake off over the next 2-3 weeks.  Any sutures or staples will be removed at the office during your follow-up visit. You may find that a light gauze bandage over your incision may keep your staples from being rubbed or pulled. You may shower and replace the bandage daily. °8. ACTIVITIES:  You may resume regular (light) daily activities beginning the next day--such as daily self-care, walking, climbing stairs--gradually increasing activities as tolerated.  You may have sexual intercourse when it is comfortable.  Refrain from any heavy lifting or straining until approved by your doctor. °a. You may drive when you no longer are taking prescription pain medication, you can comfortably wear a seatbelt, and you can safely maneuver your car and apply brakes °b. Return to Work: __________8 weeks if applicable_________________________ °9. You should see your doctor in the office for a follow-up appointment approximately two weeks after your surgery.  Make sure that you call for this appointment within a day or two after you arrive home to insure a convenient appointment time. °OTHER INSTRUCTIONS:  °_____________________________________________________________ °_____________________________________________________________ ° °WHEN TO CALL YOUR DOCTOR: °1. Fever over 101.0 °2. Inability to urinate °3. Nausea and/or vomiting °4. Extreme swelling or bruising °5. Continued bleeding from incision. °6. Increased pain, redness, or drainage from the incision. °7. Difficulty swallowing or breathing °8. Muscle cramping or spasms. °9. Numbness or tingling in hands or feet or around lips. ° °The clinic staff is   available to answer your questions during regular business hours.  Please don’t hesitate to call and ask to speak to one of the nurses if you have concerns. ° °For further questions, please visit [email or web address] ° ° ° °

## 2016-04-21 NOTE — Progress Notes (Addendum)
CSW called [facility] office to inform MD, patient has bed at [facility] and is ready for discharge.  Patient clinical information sent to facility. Facility liaison will complete paperwork with patient today.  Paperwork completed. D/C summary sent. Patient updated family. [facility] called for transport.  Transport packet on chart w/ report number   Kathrin Greathouse, Latanya Presser, MSW Clinical Social Worker [address] [place] [phone] 04/21/2016  12:49 PM

## 2016-04-21 NOTE — Discharge Summary (Addendum)
Physician Discharge Summary  Patient ID: Jacob [name] MRN: [id number] DOB/AGE: [birth year] 71 y.o.  Admit date: 04/08/2016 Discharge date: 04/21/2016  Admission Diagnoses: Patient Active Problem List   Diagnosis Date Noted  . Post-operative nausea and vomiting 04/09/2016  . 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 Genetic testing 12/19/2015  . Stomach cancer ([place])   . Gastric adenocarcinoma ([place])   . Gastric mass   . GI bleed 10/26/2015  . Gastrointestinal hemorrhage   . PUD (peptic ulcer disease)   . 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 Nocturnal hypoxemia 11/04/2013  . Noncompliance with treatment 09/02/2013  . Prostate cancer ([place]) 08/14/2011  . Iron deficiency anemia 03/19/2007  . DEPRESSION 03/19/2007  . Essential hypertension 03/19/2007  . NEOPLASM, BENIGN, STOMACH 11/23/2006  . POLYP, COLON 11/23/2006  . INTERNAL HEMORRHOIDS 11/23/2006  . GASTRITIS 11/23/2006     Discharge Diagnoses:  Active Problems:   Post-operative nausea and vomiting same  Discharged Condition: stable  Hospital Course:  Pt was readmitted for nausea/vomiting after being discharged post operatively from a distal gastrectomy with feeding tube placement.  He had issues tolerating tube feeds and continued to retch.  His g tube port was placed to gravity.  He also had some acute kidney injury on top of chronic kidney disease (stage 3).  He was rehydrated and had nausea controlled with IV antiemetics.  His tube feeds were adjusted.  He improved significantly.  He had difficulty initially tolerating g tube clamping.  However, his J tube clogged.  He was then able to tolerate G tube feeds for a bit.  At that point, he developed diarrhea.    He was then able to convert to a soft diet and at that point was able to get adequate calories.    It was apparent that he had issues at home with his care due to social situation. He and family thought it best that he at that point  go to a short term skilled nursing facility in order to get stronger prior to d/c home.    Consults: nutrition  Significant Diagnostic Studies: labs: see epic.  Cr back to below baseline.    Treatments: IV hydration  Discharge Exam: Blood pressure 127/74, pulse 69, temperature 98.5 F (36.9 C), temperature source Oral, resp. rate 18, height 6' (1.829 m), weight 111.8 kg (246 lb 8 oz), SpO2 98 %. General appearance: alert, cooperative and no distress Resp: breathing comfortably GI: soft, non tender, non distended.  Disposition: SNF  Discharge Instructions    Call MD for:  persistant dizziness or light-headedness    Complete by:  As directed    Call MD for:  persistant nausea and vomiting    Complete by:  As directed    Call MD for:  redness, tenderness, or signs of infection (pain, swelling, redness, odor or green/yellow discharge around incision site)    Complete by:  As directed    Call MD for:  severe uncontrolled pain    Complete by:  As directed    Call MD for:  temperature >100.4    Complete by:  As directed    Diet - low sodium heart healthy    Complete by:  As directed    Increase activity slowly    Complete by:  As directed      Allergies as of 04/21/2016      Reactions   No Known Allergies       Medication List    TAKE these  medications   acetaminophen 500 MG tablet Commonly known as:  TYLENOL Take 1,000 mg by mouth daily as needed for moderate pain or headache.   allopurinol 300 MG tablet Commonly known as:  ZYLOPRIM Take 300 mg by mouth daily.   buPROPion 300 MG 24 hr tablet Commonly known as:  WELLBUTRIN XL Take 300 mg by mouth daily.   BYSTOLIC 20 MG Tabs Generic drug:  Nebivolol HCl Take 20 mg by mouth daily.   calcium carbonate 500 MG chewable tablet Commonly known as:  TUMS - dosed in mg elemental calcium Chew 3 tablets by mouth 2 (two) times daily as needed for indigestion or heartburn.   diazepam 5 MG tablet Commonly known as:   VALIUM Take 5 mg by mouth at bedtime as needed (sleep).   feeding supplement (ENSURE ENLIVE) Liqd Take 237 mLs by mouth 3 (three) times daily between meals. What changed:  how much to take  how to take this  when to take this  additional instructions   free water Soln Place 100 mLs into feeding tube every 4 (four) hours.   furosemide 80 MG tablet Commonly known as:  LASIX Take 40 mg by mouth every other day.   gabapentin 800 MG tablet Commonly known as:  NEURONTIN Take 800 mg by mouth at bedtime.   GNP SLEEP AID 25 MG tablet Generic drug:  doxylamine (Sleep) Take 25-50 mg by mouth at bedtime as needed for sleep.   lidocaine-prilocaine cream Commonly known as:  EMLA Apply 1 application topically as needed. Apply to port before chemotherapy. What changed:  when to take this  reasons to take this  additional instructions   Melatonin 10 MG Tabs Take 1 tablet by mouth at bedtime as needed (sleep).   metoCLOPramide 5 MG/5ML solution Commonly known as:  REGLAN Take 10 mLs (10 mg total) by mouth 4 (four) times daily -  before meals and at bedtime.   ondansetron 4 MG disintegrating tablet Commonly known as:  ZOFRAN-ODT Take 1 tablet (4 mg total) by mouth every 6 (six) hours as needed for nausea.   oxyCODONE 5 MG immediate release tablet Commonly known as:  Oxy IR/ROXICODONE Take 1-2 tablets (5-10 mg total) by mouth every 6 (six) hours as needed for moderate pain, severe pain or breakthrough pain. What changed:  Another medication with the same name was removed. Continue taking this medication, and follow the directions you see here.   OXYGEN Inhale 2 L into the lungs at bedtime as needed (for shortness of breath).   pantoprazole 40 MG tablet Commonly known as:  PROTONIX Take 1 tablet (40 mg total) by mouth daily.   polycarbophil 625 MG tablet Commonly known as:  FIBERCON Take 1 tablet (625 mg total) by mouth daily.   prochlorperazine 10 MG tablet Commonly  known as:  COMPAZINE Take 1 tablet (10 mg total) by mouth every 6 (six) hours as needed for nausea or vomiting.   tamsulosin 0.4 MG Caps capsule Commonly known as:  FLOMAX TAKE 1 CAPSULE BY MOUTH DAILY   traZODone 50 MG tablet Commonly known as:  DESYREL Take 50 mg by mouth at bedtime.   Verapamil HCl CR 300 MG Cp24 Take 300 mg by mouth at bedtime.      Contact information for after-discharge care    Destination    [facility] .   Specialty:  [place] information: [address] [place] [phone]  SignedStark Klein 04/21/2016, 2:01 PM

## 2016-04-21 NOTE — Care Management Important Message (Signed)
Important Message  Patient Details  Name: Jacob [name] MRN: [id number] Date of Birth: [birth year]   Medicare Important Message Given:  Yes    Kerin Salen 04/21/2016, 12:56 [place] Message  Patient Details  Name: Jacob [name] MRN: [id number] Date of Birth: [birth year]   Medicare Important Message Given:  Yes    Kerin Salen 04/21/2016, 12:56 PM

## 2016-04-21 NOTE — Progress Notes (Signed)
.  Jac[name] Office:  [phone] General Surgery Progress Note   LOS: 12 days  POD -     Chief Complaint: Feels weak, but getting better.   Assessment and Plan: 1.  Diagnostic laparoscopy, distal gastrectomy with Billroth 2 reconstruction and gastrojejunostomy tube - 03/25/2016 - Kenn Rekowski  For T3N0 adenoca of stomach  PO reglan  Work on increasing PO intake.    OK for d/c to SNF when bed available.     2.  DVT prophylaxis - Lovenox 3.  Elevated Creatinine  Creatinine - 1.63 - 04/19/2016  Baseline around 1.8.     Active Problems:   Post-operative nausea and vomiting  Subjective:  Improving appetite and strength daily.    Objective:   Vitals:   04/20/16 2145 04/21/16 0600  BP: (!) 158/87 127/74  Pulse: 77 69  Resp: 18 18  Temp: 99.3 F (37.4 C) 98.5 F (36.9 C)     Intake/Output from previous day:  04/08 0701 - 04/09 0700 In: 1344 [P.O.:720; NG/GT:200; IV Piggyback:324] Out: 2200 [Urine:2200]  Intake/Output this shift:  Total I/O In: 340 [P.O.:240; Other:100] Out: -    Physical Exam:   General: Older AA M who is alert and oriented.    HEENT: Normal. Pupils equal. .   Lungs: Clear   Abdomen: Soft, BS present - gastrostomy tube in LUQ - incision okay   Lab Results:   No results for input(s): WBC, HGB, HCT, PLT in the last 72 hours.  BMET    Recent Labs  04/19/16 0451 04/20/16 0422  NA 135 134*  K 5.0 5.0  CL 112* 111  CO2 20* 20*  GLUCOSE 116* 117*  BUN 32* 30*  CREATININE 1.63* 1.59*  CALCIUM 9.9 10.1    PT/INR  No results for input(s): LABPROT, INR in the last 72 hours.  ABG  No results for input(s): PHART, HCO3 in the last 72 hours.  Invalid input(s): PCO2, PO2   Studies/Results:  No results found.   Anti-infectives:   [place] [facility] Office: [phone] 04/21/2016

## 2016-04-22 ENCOUNTER — Other Ambulatory Visit: Payer: Medicare Other

## 2016-04-22 ENCOUNTER — Ambulatory Visit: Payer: Medicare Other | Admitting: Oncology

## 2016-04-22 NOTE — Discharge Summary (Signed)
Physician Discharge Summary  Patient ID: Jacob [name] MRN: [id number] DOB/AGE: [birth year] 71 y.o.  Admit date: 03/25/2016 Discharge date: 04/22/2016  Admission Diagnoses: Patient Active Problem List   Diagnosis Date Noted  . Post-operative nausea and vomiting 04/09/2016  . 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 Genetic testing 12/19/2015  . Stomach cancer ([place])   . Gastric adenocarcinoma ([place])   . Gastric mass   . GI bleed 10/26/2015  . Gastrointestinal hemorrhage   . PUD (peptic ulcer disease)   . 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 Nocturnal hypoxemia 11/04/2013  . Noncompliance with treatment 09/02/2013  . Prostate cancer ([place]) 08/14/2011  . Iron deficiency anemia 03/19/2007  . DEPRESSION 03/19/2007  . Essential hypertension 03/19/2007  . NEOPLASM, BENIGN, STOMACH 11/23/2006  . POLYP, COLON 11/23/2006  . INTERNAL HEMORRHOIDS 11/23/2006  . 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 His creatinine was about a point higher than baseline.  He responded to hydration and placing G tube port to gravity.  Tube feeds were reinitiated.  His J tube flipped up into the stomach.  This was changed out, but then new J port of the GJ tube clogged.  By this time, his PO intake had improved to the point that his tube feeds were stopped.  He was discharged to SNF in stable condition.  Pain was controlled with oral pain medication.      Consults: IR consult  Significant Diagnostic Studies: labs: creatinine at discharge 1.59.    Treatments: IV hydration and therapies: PT  Discharge Exam: Blood pressure 137/84, pulse 83, temperature  98.4 F (36.9 C), temperature source Oral, resp. rate 18, height 6\' 2"  (1.88 m), weight 111.6 kg (246 lb 0.5 oz), SpO2 97 %. General appearance: alert, cooperative and no distress GI: soft, non distended, GJ in place  Disposition: 03-Skilled Nursing Facility  Discharge Instructions    Call MD for:  persistant nausea and vomiting    Complete by:  As directed    Call MD for:  redness, tenderness, or signs of infection (pain, swelling, redness, odor or green/yellow discharge around incision site)    Complete by:  As directed    Call MD for:  severe uncontrolled pain    Complete by:  As directed    Call MD for:  temperature >100.4    Complete by:  As directed    Diet - low sodium heart healthy    Complete by:  As directed    Increase activity slowly    Complete by:  As directed      Allergies as of 04/07/2016      Reactions   No Known Allergies       Medication List    TAKE these medications   acetaminophen 500 MG tablet Commonly known as:  TYLENOL Take 1,000 mg by mouth daily as needed for moderate pain or headache.   allopurinol 300 MG tablet Commonly known as:  ZYLOPRIM Take 300 mg by mouth daily.   buPROPion 300 MG 24 hr tablet Commonly known as:  WELLBUTRIN XL Take 300 mg by mouth daily.   BYSTOLIC 20 MG Tabs Generic drug:  Nebivolol HCl Take 20 mg by mouth daily.  calcium carbonate 500 MG chewable tablet Commonly known as:  TUMS - dosed in mg elemental calcium Chew 3 tablets by mouth 2 (two) times daily as needed for indigestion or heartburn.   diazepam 5 MG tablet Commonly known as:  VALIUM Take 5 mg by mouth at bedtime as needed (sleep).   furosemide 80 MG tablet Commonly known as:  LASIX Take 40 mg by mouth every other day.   gabapentin 800 MG tablet Commonly known as:  NEURONTIN Take 800 mg by mouth at bedtime.   GNP SLEEP AID 25 MG tablet Generic drug:  doxylamine (Sleep) Take 25-50 mg by mouth at bedtime as needed for sleep.    lidocaine-prilocaine cream Commonly known as:  EMLA Apply 1 application topically as needed. Apply to port before chemotherapy. What changed:  when to take this  reasons to take this  additional instructions   Melatonin 10 MG Tabs Take 1 tablet by mouth at bedtime as needed (sleep).   OXYGEN Inhale 2 L into the lungs at bedtime as needed (for shortness of breath).   prochlorperazine 10 MG tablet Commonly known as:  COMPAZINE Take 1 tablet (10 mg total) by mouth every 6 (six) hours as needed for nausea or vomiting.   tamsulosin 0.4 MG Caps capsule Commonly known as:  FLOMAX TAKE 1 CAPSULE BY MOUTH DAILY   traZODone 50 MG tablet Commonly known as:  DESYREL Take 50 mg by mouth at bedtime.   Verapamil HCl CR 300 MG Cp24 Take 300 mg by mouth at bedtime.        SignedStark Klein 04/22/2016, 5:14 PM

## 2016-04-23 ENCOUNTER — Encounter: Payer: Self-pay | Admitting: Internal Medicine

## 2016-04-23 ENCOUNTER — Non-Acute Institutional Stay (SKILLED_NURSING_FACILITY): Payer: Medicare Other | Admitting: Internal Medicine

## 2016-04-23 DIAGNOSIS — R531 Weakness: Secondary | ICD-10-CM | POA: Diagnosis not present

## 2016-04-23 DIAGNOSIS — N189 Chronic kidney disease, unspecified: Secondary | ICD-10-CM | POA: Diagnosis not present

## 2016-04-23 DIAGNOSIS — C163 Malignant neoplasm of pyloric antrum: Secondary | ICD-10-CM | POA: Diagnosis not present

## 2016-04-23 DIAGNOSIS — I10 Essential (primary) hypertension: Secondary | ICD-10-CM

## 2016-04-23 DIAGNOSIS — G629 Polyneuropathy, unspecified: Secondary | ICD-10-CM | POA: Diagnosis not present

## 2016-04-23 DIAGNOSIS — F329 Major depressive disorder, single episode, unspecified: Secondary | ICD-10-CM | POA: Diagnosis not present

## 2016-04-23 DIAGNOSIS — K30 Functional dyspepsia: Secondary | ICD-10-CM | POA: Diagnosis not present

## 2016-04-23 DIAGNOSIS — Z9889 Other specified postprocedural states: Secondary | ICD-10-CM

## 2016-04-23 DIAGNOSIS — R112 Nausea with vomiting, unspecified: Secondary | ICD-10-CM | POA: Diagnosis not present

## 2016-04-23 DIAGNOSIS — D500 Iron deficiency anemia secondary to blood loss (chronic): Secondary | ICD-10-CM

## 2016-04-23 DIAGNOSIS — R1310 Dysphagia, unspecified: Secondary | ICD-10-CM | POA: Diagnosis not present

## 2016-04-23 DIAGNOSIS — F32A Depression, unspecified: Secondary | ICD-10-CM

## 2016-04-23 NOTE — Progress Notes (Signed)
LOCATION: [name]  PCP: Tivis Ringer, MD   Code Status: Full Code  Goals of care: Advanced Directive information Advanced Directives 04/09/2016  Does Patient Have a Medical Advance Directive? -  Would patient like information on creating a medical advance directive? No - Patient declined       Extended Emergency Contact Information Primary Emergency Contact: [facility] Address: [address]          [place], [place] [address] [place] Phone: [phone] Relation: Spouse Secondary Emergency Contact: Celesta Gentile [place] Phone: [phone] Mobile Phone: [phone] Relation: Son   Allergies  Allergen Reactions  . No Known Allergies     Chief Complaint  Patient presents with  . New Admit To SNF    admission visit     HPI:  Patient is a 71 y.o. male seen today for short term rehabilitation post hospital admission from 04/08/2016-04/21/2016 with postoperative intractable nausea and vomiting. Of note patient was in the hospital from 03/25/16-04/07/16 with primary adenocarcinoma of pyloric antrum and underwent distal gastrectomy with feeding tube placement and was discharged home. He was at his place for 1 day and because of ongoing nausea and vomiting he was readmitted to the hospital. He was noted to have acute on chronic renal failure. He required IV fluids and IV antiemetics. He is seen in his room today. He has PMH of CKD stage 3, depression, GERD among others.    Review of Systems:  Constitutional: Negative for fever, chills, diaphoresis. Energy level is slowly coming back. HENT: Negative for headache, congestion, nasal discharge, sore throat. Positive for difficulty swallowing with solid food with feeling of food getting stuck.    Eyes: Negative for eye pain, blurred vision, double vision and discharge.  Respiratory: Negative for cough, shortness of breath and wheezing.   Cardiovascular: Negative  for chest pain, palpitations, leg swelling.  Gastrointestinal: Negative for nausea, vomiting, abdominal pain, melena, diarrhea and constipation. Positive for occasional heartburn. Last bowel movement was on Monday. At home had a bowel movement every 2-3 days. His appetite is slowly picking up.  Genitourinary: Negative for dysuria and flank pain.  Musculoskeletal: Negative for back pain, fall in the facility.  Skin: Negative for itching, rash.  Neurological: Negative for dizziness. Psychiatric/Behavioral: Negative for depression.   Past Medical History:  Diagnosis Date  . Anemia    hx iron deficiency  . Anxiety   . Arthritis    gout  . Back pain   . BPH with obstruction/lower urinary tract symptoms   . Chronic kidney disease    "they said I do"  . Constipation   . Depression   . ED (erectile dysfunction)   . Epigastric pain   . GERD (gastroesophageal reflux disease)   . Heartburn   . History of blood transfusion   . History of radiation therapy 11/03/11-12/29/11   prostate  . Hypercholesterolemia   . Hypertension   . Night sweats   . Post-operative nausea and vomiting 04/09/2016  . Prostate cancer ([place]) 08/14/11   Adenocarcinoma,gleason:3+3=6,& 3+4=7,PSA=5.66  . PUD (peptic ulcer disease)   . Sleep apnea    does not use Cpap but does use O2 @ 2l   . Stomach cancer ([place])   . Ulcer ([place])    peptic ulcer hx   Past Surgical History:  Procedure Laterality Date  . colon polyps bx  11/24/06   colon,transverse and rectosigmoid polyps:tubular adenomas and hyperplastic polyps,no high grade dysplasia or malignancy   .  duodenal bx  11/24/06   benign  . ESOPHAGOGASTRODUODENOSCOPY N/A 10/27/2015   Procedure: ESOPHAGOGASTRODUODENOSCOPY (EGD);  Surgeon: [name], MD;  Location: [facility];  Service: Endoscopy;  Laterality: N/A;  . EUS N/A 11/08/2015   Procedure: UPPER ENDOSCOPIC ULTRASOUND (EUS) RADIAL;  Surgeon: [name], MD;  Location: [facility];  Service:  Endoscopy;  Laterality: N/A;  . GASTRECTOMY N/A 03/25/2016   Procedure: DISTAL GASTRECTOMY;  Surgeon: [name], MD;  Location: [facility];  Service: General;  Laterality: N/A;  . gastric bx  11/24/06   chronic active gastritis,with metaplasia and focal changaes of xanthelasma  . GASTROSTOMY N/A 03/25/2016   Procedure: INSERTION OF FEEDING TUBE;  Surgeon: [name], MD;  Location: [facility];  Service: General;  Laterality: N/A;  . INSERTION PROSTATE RADIATION SEED  12-29-11  . IR GENERIC HISTORICAL  04/01/2016   IR GJ TUBE CHANGE 04/01/2016 Markus Daft, MD [facility]  . IR GENERIC HISTORICAL  04/05/2016   IR GJ TUBE CHANGE 04/05/2016 Markus Daft, MD [facility]  . LAPAROSCOPIC GASTRECTOMY  03/25/2016   Diagnostic laparoscopy, distal gastrectomy with Billroth 2 reconstruction and gastrojejunostomy tube  . LAPAROSCOPY N/A 03/25/2016   Procedure: DIAGNOSTIC LAPAROSCOPY;  Surgeon: [name], MD;  Location: [facility];  Service: General;  Laterality: N/A;  . PORTACATH PLACEMENT Left 12/04/2015   Procedure: INSERTION PORT-A-CATH;  Surgeon: [name], MD;  Location: [facility];  Service: General;  Laterality: Left;  . PROSTATE BIOPSY  08/14/11   Adenocarcinoma/volume=58.51cc,gleason=3+3=6 & 3+4=7  . TONSILLECTOMY     71 years old   Social History:   reports that he quit smoking about 16 years ago. His smoking use included Cigarettes. He has a 45.00 pack-year smoking history. He has never used smokeless tobacco. He reports that he drinks about 0.6 oz of alcohol per week . He reports that he does not use drugs.  Family History  Problem Relation Age of Onset  . Cancer Mother     NOS  . Alcohol abuse Father   . Alcohol abuse Brother 50  . Cancer Paternal Aunt     NOS  . Colon cancer Neg Hx     Medications: Allergies as of 04/23/2016      Reactions   No Known Allergies       Medication List       Accurate as of 04/23/16  1:22 PM. Always use your most recent med list.          acetaminophen 500  MG tablet Commonly known as:  TYLENOL Take 1,000 mg by mouth daily as needed for moderate pain or headache.   allopurinol 300 MG tablet Commonly known as:  ZYLOPRIM Take 300 mg by mouth daily.   buPROPion 300 MG 24 hr tablet Commonly known as:  WELLBUTRIN XL Take 300 mg by mouth daily.   BYSTOLIC 20 MG Tabs Generic drug:  Nebivolol HCl Take 20 mg by mouth daily.   calcium carbonate 500 MG chewable tablet Commonly known as:  TUMS - dosed in mg elemental calcium Chew 3 tablets by mouth 2 (two) times daily as needed for indigestion or heartburn.   diazepam 5 MG tablet Commonly known as:  VALIUM Take 5 mg by mouth at bedtime as needed (sleep).   ENSURE ENLIVE PO Take by mouth.   free water Soln Place 100 mLs into feeding tube every 4 (four) hours.   furosemide 40 MG tablet Commonly known as:  LASIX Take 40 mg by mouth every other day.  gabapentin 800 MG tablet Commonly known as:  NEURONTIN Take 800 mg by mouth at bedtime.   lidocaine-prilocaine cream Commonly known as:  EMLA Apply 1 application topically as needed. Apply to port before chemotherapy.   Melatonin 10 MG Tabs Take 1 tablet by mouth at bedtime as needed (sleep).   metoCLOPramide 5 MG/5ML solution Commonly known as:  REGLAN Take 10 mLs (10 mg total) by mouth 4 (four) times daily -  before meals and at bedtime.   ondansetron 4 MG disintegrating tablet Commonly known as:  ZOFRAN-ODT Take 1 tablet (4 mg total) by mouth every 6 (six) hours as needed for nausea.   oxyCODONE 5 MG immediate release tablet Commonly known as:  Oxy IR/ROXICODONE Take 1-2 tablets (5-10 mg total) by mouth every 6 (six) hours as needed for moderate pain, severe pain or breakthrough pain.   OXYGEN Inhale 2 L into the lungs at bedtime as needed (for shortness of breath).   pantoprazole 40 MG tablet Commonly known as:  PROTONIX Take 1 tablet (40 mg total) by mouth daily.   polycarbophil 625 MG tablet Commonly known as:   FIBERCON Take 1 tablet (625 mg total) by mouth daily.   prochlorperazine 10 MG tablet Commonly known as:  COMPAZINE Take 1 tablet (10 mg total) by mouth every 6 (six) hours as needed for nausea or vomiting.   tamsulosin 0.4 MG Caps capsule Commonly known as:  FLOMAX TAKE 1 CAPSULE BY MOUTH DAILY   traZODone 50 MG tablet Commonly known as:  DESYREL Take 50 mg by mouth at bedtime.   Verapamil HCl CR 300 MG Cp24 Take 300 mg by mouth at bedtime.       Immunizations: Immunization History  Administered Date(s) Administered  . Influenza Split 11/01/2013  . Influenza, High Dose Seasonal PF 10/22/2015  . PPD Test 04/21/2016     Physical Exam: Vitals:   04/23/16 1127  BP: (!) 155/93  Pulse: 81  Resp: 20  Temp: 98.4 F (36.9 C)  TempSrc: Oral  SpO2: 94%  Weight: 242 lb 3.2 oz (109.9 kg)  Height: 6\' 2"  (1.88 m)   Body mass index is 31.1 kg/m.  General- elderly male, frail, in no acute distress Head- normocephalic, atraumatic Nose- no maxillary or frontal sinus tenderness, no nasal discharge Throat- moist mucus membrane, normal oropharynx, edentulous Eyes- PERRLA, EOMI, no pallor, no icterus, no discharge, normal conjunctiva, normal sclera Neck- no cervical lymphadenopathy Cardiovascular- normal s1,s2, no murmur Respiratory- bilateral clear to auscultation, no wheeze, no rhonchi, no crackles, no use of accessory muscles Abdomen- bowel sounds present, soft, non tender, no guarding or rigidity, PEG tube in place with dressing clean and dry Musculoskeletal- able to move all 4 extremities, generalized weakness, trace leg edema Neurological- alert and oriented to person, place and time Skin- warm and dry, well-healed surgical incision to mid abdominal wall Psychiatry- normal mood and affect    Labs reviewed: Basic Metabolic Panel:  Recent Labs  04/01/16 0407 04/04/16 0442  04/18/16 0544 04/19/16 0451 04/20/16 0422  NA 139 136  < > 133* 135 134*  K 3.8 4.1  < >  5.0 5.0 5.0  CL 108 105  < > 112* 112* 111  CO2 22 22  < > 18* 20* 20*  GLUCOSE 81 89  < > 126* 116* 117*  BUN 24* 28*  < > 41* 32* 30*  CREATININE 1.69* 1.98*  < > 1.73* 1.63* 1.59*  CALCIUM 9.6 9.5  < > 9.7 9.9 10.1  PHOS  3.0 3.3  --   --   --   --   < > = values in this interval not displayed. Liver Function Tests:  Recent Labs  02/20/16 0853 03/12/16 1002 04/01/16 0407 04/04/16 0442 04/08/16 2219  AST 30 30  --   --  42*  ALT 20 21  --   --  24  ALKPHOS 46 48  --   --  46  BILITOT 0.60 0.42  --   --  0.5  PROT 6.9 6.9  --   --  7.2  ALBUMIN 2.9* 2.9* 2.2* 2.2* 2.8*    Recent Labs  11/27/15 0825 04/08/16 2219  LIPASE 49 128*   No results for input(s): AMMONIA in the last 8760 hours. CBC:  Recent Labs  02/20/16 0853 03/12/16 1001  04/08/16 2219 04/09/16 0348 04/11/16 0505  WBC 6.5 4.1  < > 9.8 9.3 6.1  NEUTROABS 4.5 1.9  --  7.8*  --   --   HGB 9.4* 9.1*  < > 10.0* 9.6* 8.2*  HCT 28.5* 28.3*  < > 30.7* 28.3* 24.6*  MCV 94.8 96.6  < > 93.0 92.5 95.0  PLT 131* 181  < > 268 247 186  < > = values in this interval not displayed. Cardiac Enzymes: No results for input(s): CKTOTAL, CKMB, CKMBINDEX, TROPONINI in the last 8760 hours. BNP: Invalid input(s): POCBNP CBG:  Recent Labs  04/07/16 0402 04/07/16 0819 04/07/16 1159  GLUCAP 103* 135* 117*    Radiological Exams: Ct Abdomen Pelvis Wo Contrast  Result Date: 04/08/2016 CLINICAL DATA:  Acute onset of generalized abdominal pain. Initial encounter. EXAM: CT ABDOMEN AND PELVIS WITHOUT CONTRAST TECHNIQUE: Multidetector CT imaging of the abdomen and pelvis was performed following the standard protocol without IV contrast. COMPARISON:  PET/CT performed 02/07/2016 FINDINGS: Lower chest: Mild bibasilar atelectasis is noted. Mild calcification is seen at the mitral valve. Hepatobiliary: The liver is unremarkable in appearance. The gallbladder is unremarkable in appearance. The common bile duct remains normal in  caliber. Pancreas: The pancreas is within normal limits. Spleen: The spleen is unremarkable in appearance. Adrenals/Urinary Tract: The adrenal glands are unremarkable in appearance. The kidneys are within normal limits. There is no evidence of hydronephrosis. No renal or ureteral stones are identified. No perinephric stranding is seen. Stomach/Bowel: The patient's G-tube is noted coiled within the stomach. The patient is status post partial gastrectomy. The distal duodenum is unremarkable in appearance. No focal small bowel abnormalities are seen. The small bowel is largely decompressed. The appendix is normal in caliber, without evidence of appendicitis. Scattered diverticulosis is noted along the descending and proximal sigmoid colon, without evidence of diverticulitis. Vascular/Lymphatic: Scattered calcification is seen along the abdominal aorta and its branches. The abdominal aorta is otherwise grossly unremarkable. The inferior vena cava is grossly unremarkable. No retroperitoneal lymphadenopathy is seen. No pelvic sidewall lymphadenopathy is identified. Reproductive: The bladder is mildly distended and contains trace air, raising question for recent Foley catheter placement. The prostate is borderline enlarged, measuring 4.9 cm in transverse dimension. 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 Musculoskeletal: No acute osseous abnormalities are identified. The visualized musculature is unremarkable in appearance. IMPRESSION: 1. 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  2. Status post partial gastrectomy. Remaining stomach is grossly unremarkable. G-tube is noted coiled within the stomach. 3. Borderline enlarged prostate. 4. Scattered aortic  atherosclerosis. 5. Mild bibasilar atelectasis. 6. Mild calcification at the mitral valve. 7. Scattered diverticulosis along the descending and proximal sigmoid colon, without evidence of diverticulitis. Electronically Signed   By: Garald Balding M.D.   On: 04/08/2016 23:48   Ir Gj Tube Change  Result Date: 04/05/2016 INDICATION: History of distal pancreatectomy and Billroth 2 reconstruction. Patient has a GJ tube for tube feeds and gastric decompression. Recurrent occlusion of the jejunal tube. Patient continues to need tube feeds. EXAM: EXCHANGE OF GJ TUBE WITH FLUOROSCOPY MEDICATIONS: None ANESTHESIA/SEDATION: None CONTRAST:  30 mL Isovue-300 - administered into the gastric lumen. FLUOROSCOPY TIME:  Fluoroscopy Time: 9 minutes 18 seconds (61 mGy). COMPLICATIONS: None immediate. PROCEDURE: Informed written consent was obtained from the patient after a thorough discussion of the procedural risks, benefits and alternatives. All questions were addressed. Maximal Sterile Barrier Technique was utilized including caps, mask, sterile gowns, sterile gloves, sterile drape, hand hygiene and skin antiseptic. A timeout was performed prior to the initiation of the procedure. The jejunal tube was completely occluded and unable to flush anything through it. The retention balloon was decompressed and the gastric portion of the tube was removed. Immediately, a large amount of dark reddish colored fluid was draining from the stomach. The jejunal tube was partially pulled out and then cut. A Glidewire was successfully advanced through the jejunal tube with much difficulty. The tube was occluded with tube feeds. However, the jejunal tube retracted into the stomach during wire placement. Five French catheter was advanced in the stomach and contrast injection demonstrated gastric distention and the small bowel anastomosis was not easily identified. Therefore, an 22 French gastrostomy tube was temporally placed for decompression.  350 mL of dark gastric fluid was removed. After stomach decompression, the jejunal anastomosis was easily visualized. Five [place] Kumpe catheter was advanced into the small bowel. Bentson wire was placed. A 20 French GJ tube was selected. The end of the tube was cut off in order to shorten the length. The tube was easily advanced over the Bentson wire in the tip was well positioned in the jejunum. Contrast injection confirmed placement in the small bowel. The retention balloon was inflated with 7 mL of dilute contrast. Gastric lumen was injected to confirm placement in the stomach. Fluoroscopic images were taken and saved for this procedure. IMPRESSION: Successful replacement of the GJ feeding tube with fluoroscopy. The jejunal tube is well positioned in the small bowel. The stomach was markedly distended. Approximately 350 mL of fluid was aspirated from the stomach during this procedure. Electronically Signed   By: Markus Daft M.D.   On: 04/05/2016 14:08   Ir Gj Tube Change  Result Date: 04/01/2016 INDICATION: History of distal gastrectomy with Billroth 2 reconstitution. Patient has a GJ feeding tube. The jejunal tube is not functioning. Request for a tube exchange. EXAM: GASTROJEJUNAL CATHETER REPLACEMENT WITH FLUOROSCOPY MEDICATIONS: None ANESTHESIA/SEDATION: None CONTRAST:  25 mL Isovue-300 FLUOROSCOPY TIME:  Fluoroscopy Time: 2 minutes and 24 seconds, 21 mGy COMPLICATIONS: None immediate. PROCEDURE: Informed written consent was obtained from the patient after a thorough discussion of the procedural risks, benefits and alternatives. All questions were addressed. Maximal Sterile Barrier Technique was utilized including caps, mask, sterile gowns, sterile gloves, sterile drape, hand hygiene and skin antiseptic. A timeout was performed prior to the initiation of the procedure. The GJ tube catheter was  prepped and draped in a sterile fashion. Glidewire was able to be advanced through the existing jejunal catheter.  The skin retention sutures were removed. The existing tube was removed over the wire. A new 18 French GJ feeding tube was advanced over the wire. The distal aspect of the tube advanced into the small bowel. Proximal portion of the jejunal catheter was coiled in the stomach. The retention balloon was inflated with 8 mL of dilute contrast in the stomach. Contrast was injected through the gastric and jejunal lumens to confirm appropriate placement. Both lumens were then flushed with saline. FINDINGS: The new jejunal tube is in the small bowel. IMPRESSION: Successful exchange of the GJ feeding tube.  Tube is ready for use. Electronically Signed   By: Markus Daft M.D.   On: 04/01/2016 16:56   Dg Abd Portable 1v  Result Date: 04/16/2016 CLINICAL DATA:  Nausea. EXAM: PORTABLE ABDOMEN - 1 VIEW COMPARISON:  CT 04/08/2016. FINDINGS: 0812 hours. Two views are submitted. Percutaneous gastrojejunostomy feeding tube is looped in the gastric fundus, no longer extending into the small bowel. This is unchanged from recent CT. The bowel gas pattern is nonobstructive. Relative displacement of bowel from the mid abdomen corresponds with omental infarct demonstrated on recent CT. Probable contrast retained within descending colonic diverticula as correlated with prior CT. No suspicious calcifications. The bones appear unremarkable. IMPRESSION: No acute findings. The GJ tube remains coiled in the stomach status post partial gastrectomy. Electronically Signed   By: Richardean Sale M.D.   On: 04/16/2016 08:47    Assessment/Plan  Generalized weakness From physical deconditioning.Will have him work with physical therapy and occupational therapy team to help with gait training and muscle strengthening exercises.fall precautions. Skin out of bed.   Adenocarcinoma of stomach Status post distal gastrectomy and feeding tube placement. Has been tolerating oral feeds well. Encourage oral intake. Provide feeding supplement as needed.  Monitor weekly wait for now. Will need follow-up with oncology and general surgery. Continue oxycodone 5 mg 1-2 tablet every 6 hours as needed for pain.  Dysphagia Will get speech evaluation. Aspiration precautions to be taken. Currently on mechanical soft diet.  Chronic kidney disease unspecified With acute renal failure in the hospital, responded well to IV fluids. Check BMP.  Delayed gastric emptying Continue Reglan 4 times a day with meals. Small but frequent meals encouraged. Continue pantoprazole 40 mg daily  Blood loss anemia Postoperative, monitor CBC  Nausea and vomiting Denies any nausea or vomiting today. Likely post op with delayed gastric emptying. Currently on Zofran 4 mg every 6 hours as needed. Monitor clinically and oral intake.  Chronic depression Mood appears stable. Continue bupropion 300 mg daily  Essential hypertension Continue Bystolic 20 mg daily, verapamil 300 mg daily and Lasix 40 mg every other day and monitor blood pressure reading. Check BMP.  Neuropathy  currently on gabapentin 800 mg daily, monitor clinically    Goals of care: short term rehabilitation   Labs/tests ordered: cbc, cmp 04/28/16  Family/ staff Communication: reviewed care plan with patient and nursing supervisor  I have spent greater than 50 minutes for this encounter which includes reviewing hospital records, addressing above mentioned concerns, reviewing care plan with patient, answering patient's concerns and counseling.     Blanchie Serve, MD Internal Medicine [facility] [place], [place] [address] Cell Phone (Monday-Friday 8 am - 5 pm): [phone] On Call: [phone] and follow prompts after 5 pm and on weekends Office Phone:  [phone] Office Fax: [phone]

## 2016-05-01 ENCOUNTER — Telehealth: Payer: Self-pay | Admitting: Oncology

## 2016-05-01 NOTE — Telephone Encounter (Signed)
A scheduling message sent.

## 2016-05-01 NOTE — Telephone Encounter (Signed)
Monique's number is [phone]  [id number]

## 2016-05-01 NOTE — Telephone Encounter (Signed)
Patient needs to reschedule an appointment that he missed on 4/10.  Patient is also at [facility] (rehabilitation facility) Beckie Busing called to make the appointment for the patient.

## 2016-05-02 ENCOUNTER — Telehealth: Payer: Self-pay | Admitting: Oncology

## 2016-05-02 NOTE — Telephone Encounter (Signed)
R/s appt per sch message from 4/19 los. [facility] ( transportation ) aware.

## 2016-05-06 ENCOUNTER — Non-Acute Institutional Stay (SKILLED_NURSING_FACILITY): Payer: Medicare Other | Admitting: Family

## 2016-05-06 ENCOUNTER — Encounter: Payer: Self-pay | Admitting: Family

## 2016-05-06 DIAGNOSIS — R600 Localized edema: Secondary | ICD-10-CM | POA: Diagnosis not present

## 2016-05-06 DIAGNOSIS — G4700 Insomnia, unspecified: Secondary | ICD-10-CM

## 2016-05-06 DIAGNOSIS — R2681 Unsteadiness on feet: Secondary | ICD-10-CM | POA: Diagnosis not present

## 2016-05-06 DIAGNOSIS — F329 Major depressive disorder, single episode, unspecified: Secondary | ICD-10-CM

## 2016-05-06 DIAGNOSIS — C163 Malignant neoplasm of pyloric antrum: Secondary | ICD-10-CM

## 2016-05-06 DIAGNOSIS — F32A Depression, unspecified: Secondary | ICD-10-CM

## 2016-05-06 NOTE — Progress Notes (Signed)
Location:  [facility] Room Number: 161 Place of Service:  SNF (31)  Provider: Marlowe Sax FNP-C   PCP: Tivis Ringer, MD Patient Care Team: Prince Solian, MD as PCP - General (Internal Medicine)  Extended Emergency Contact Information Primary Emergency Contact: [facility] Address: [address]          [place], [place] [address] [place] Phone: [phone] Relation: Spouse Secondary Emergency Contact: Celesta Gentile [place] Phone: [phone] Mobile Phone: [phone] Relation: Son  Code Status:Full code  Goals of care:  Advanced Directive information Advanced Directives 04/09/2016  Does Patient Have a Medical Advance Directive? -  Would patient like information on creating a medical advance directive? No - Patient declined     Allergies  Allergen Reactions  . No Known Allergies     Chief Complaint  Patient presents with  . Discharge Note    Discharge    HPI:  71 y.o. male seen today at [facility] for discharge home. He was here for short term rehabilitation for post hospital admission from 04/08/2016-04/21/2016 with postoperative intractable nausea and vomiting. Of note patient was in the hospital from 03/2016/18-3/26/2018 with primary adenocarcinoma of pyloric antrum and underwent distal gastrectomy with feeding tube placement and was discharged home. He was at home for one day and due to worsening nausea and vomiting he was readmitted to the hospital. He was noted to have acute on chronic renal failure. He required IV fluids and IV antiemetics.He has a medical HTN, HTN, CKD stage 3, GERD,Depression among other conditions. He is seen in his room today. He denies any acute issues. He states pain under control. He has had unremarkable stay here in rehab.   He has worked well with PT/OT now stable for discharge home.She will be discharged home with Home  health PT/OT to continue with ROM, Exercise, Gait stability and muscle strengthening. He does not require any DME. Home health services will be arranged by facility social worker prior to discharge.He will discharge home with medication from the facility. Prescription medication will be written x 1 month then patient to follow up with PCP in 1-2 weeks. He has a post-op follow up appointment with [facility] 05/13/2016 for possible PEG tube removal per patient.He also has upcoming appointments with his oncologist for follow up adenocarcinoma of the stomach.Facility staff report no new concerns.    Past Medical History:  Diagnosis Date  . Anemia    hx iron deficiency  . Anxiety   . Arthritis    gout  . Back pain   . BPH with obstruction/lower urinary tract symptoms   . Chronic kidney disease    "they said I do"  . Constipation   . Depression   . ED (erectile dysfunction)   . Epigastric pain   . GERD (gastroesophageal reflux disease)   . Heartburn   . History of blood transfusion   . History of radiation therapy 11/03/11-12/29/11   prostate  . Hypercholesterolemia   . Hypertension   . Night sweats   . Post-operative nausea and vomiting 04/09/2016  . Prostate cancer ([place]) 08/14/11   Adenocarcinoma,gleason:3+3=6,& 3+4=7,PSA=5.66  . PUD (peptic ulcer disease)   . Sleep apnea    does not use Cpap but does use O2 @ 2l   . Stomach cancer ([place])   . Ulcer    peptic ulcer hx    Past Surgical History:  Procedure Laterality Date  . colon  polyps bx  11/24/06   colon,transverse and rectosigmoid polyps:tubular adenomas and hyperplastic polyps,no high grade dysplasia or malignancy   . duodenal bx  11/24/06   benign  . ESOPHAGOGASTRODUODENOSCOPY N/A 10/27/2015   Procedure: ESOPHAGOGASTRODUODENOSCOPY (EGD);  Surgeon: [name], MD;  Location: [facility];  Service: Endoscopy;  Laterality: N/A;  . EUS N/A 11/08/2015   Procedure: UPPER ENDOSCOPIC ULTRASOUND (EUS) RADIAL;  Surgeon:  [name], MD;  Location: [facility];  Service: Endoscopy;  Laterality: N/A;  . GASTRECTOMY N/A 03/25/2016   Procedure: DISTAL GASTRECTOMY;  Surgeon: [name], MD;  Location: [facility];  Service: General;  Laterality: N/A;  . gastric bx  11/24/06   chronic active gastritis,with metaplasia and focal changaes of xanthelasma  . GASTROSTOMY N/A 03/25/2016   Procedure: INSERTION OF FEEDING TUBE;  Surgeon: [name], MD;  Location: [facility];  Service: General;  Laterality: N/A;  . INSERTION PROSTATE RADIATION SEED  12-29-11  . IR GENERIC HISTORICAL  04/01/2016   IR GJ TUBE CHANGE 04/01/2016 Markus Daft, MD [facility]  . IR GENERIC HISTORICAL  04/05/2016   IR GJ TUBE CHANGE 04/05/2016 Markus Daft, MD [facility]  . LAPAROSCOPIC GASTRECTOMY  03/25/2016   Diagnostic laparoscopy, distal gastrectomy with Billroth 2 reconstruction and gastrojejunostomy tube  . LAPAROSCOPY N/A 03/25/2016   Procedure: DIAGNOSTIC LAPAROSCOPY;  Surgeon: [name], MD;  Location: [facility];  Service: General;  Laterality: N/A;  . PORTACATH PLACEMENT Left 12/04/2015   Procedure: INSERTION PORT-A-CATH;  Surgeon: [name], MD;  Location: [facility];  Service: General;  Laterality: Left;  . PROSTATE BIOPSY  08/14/11   Adenocarcinoma/volume=58.51cc,gleason=3+3=6 & 3+4=7  . TONSILLECTOMY     71 years old      reports that he quit smoking about 16 years ago. His smoking use included Cigarettes. He has a 45.00 pack-year smoking history. He has never used smokeless tobacco. He reports that he drinks about 0.6 oz of alcohol per week . He reports that he does not use drugs. Social History   Social History  . Marital status: Married    Spouse name: [name]  . Number of children: 3  . Years of education: 12   Occupational History  . retired Public house manager   Social History Main Topics  . Smoking status: Former Smoker    Packs/day: 1.50    Years: 30.00    Types: Cigarettes    Quit date: 04/13/2000  . Smokeless tobacco: Never  Used  . Alcohol use 0.6 oz/week    1 Cans of beer per week     Comment: 1-2 beers every other night  . Drug use: No  . Sexual activity: Not Currently   Other Topics Concern  . Not on file   Social History Narrative   Patient is married [name]) and lives at home with his wife and one child.   Patient has three children.   Patient is retired.   Patient has a high school education.   Patient is ambi-dextrous.   Patient drinks two cups of coffee about three times a week.     Allergies  Allergen Reactions  . No Known Allergies     Pertinent  Health Maintenance Due  Topic Date Due  . PNA vac Low Risk Adult (1 of 2 - PCV13) 11/10/2010  . INFLUENZA VACCINE  08/13/2016  . COLONOSCOPY  02/08/2017    Medications: Allergies as of 05/06/2016      Reactions   No Known Allergies  Medication List       Accurate as of 05/06/16  3:20 PM. Always use your most recent med list.          acetaminophen 500 MG tablet Commonly known as:  TYLENOL Take 1,000 mg by mouth daily as needed for moderate pain or headache.   allopurinol 300 MG tablet Commonly known as:  ZYLOPRIM Take 300 mg by mouth daily.   buPROPion 300 MG 24 hr tablet Commonly known as:  WELLBUTRIN XL Take 300 mg by mouth daily.   BYSTOLIC 20 MG Tabs Generic drug:  Nebivolol HCl Take 20 mg by mouth daily.   calcium carbonate 500 MG chewable tablet Commonly known as:  TUMS - dosed in mg elemental calcium Chew 3 tablets by mouth 2 (two) times daily as needed for indigestion or heartburn.   diazepam 5 MG tablet Commonly known as:  VALIUM Take 5 mg by mouth at bedtime as needed (sleep).   ENSURE ENLIVE PO Take by mouth.   free water Soln Place 100 mLs into feeding tube every 4 (four) hours.   furosemide 40 MG tablet Commonly known as:  LASIX Take 40 mg by mouth every other day.   gabapentin 800 MG tablet Commonly known as:  NEURONTIN Take 800 mg by mouth at bedtime.   lidocaine-prilocaine  cream Commonly known as:  EMLA Apply 1 application topically as needed. Apply to port before chemotherapy.   Melatonin 10 MG Tabs Take 1 tablet by mouth at bedtime as needed (sleep).   metoCLOPramide 5 MG/5ML solution Commonly known as:  REGLAN Take 10 mLs (10 mg total) by mouth 4 (four) times daily -  before meals and at bedtime.   ondansetron 4 MG disintegrating tablet Commonly known as:  ZOFRAN-ODT Take 1 tablet (4 mg total) by mouth every 6 (six) hours as needed for nausea.   oxyCODONE 5 MG immediate release tablet Commonly known as:  Oxy IR/ROXICODONE Take 1-2 tablets (5-10 mg total) by mouth every 6 (six) hours as needed for moderate pain, severe pain or breakthrough pain.   OXYGEN Inhale 2 L into the lungs at bedtime as needed (for shortness of breath).   pantoprazole 40 MG tablet Commonly known as:  PROTONIX Take 1 tablet (40 mg total) by mouth daily.   polycarbophil 625 MG tablet Commonly known as:  FIBERCON Take 1 tablet (625 mg total) by mouth daily.   prochlorperazine 10 MG tablet Commonly known as:  COMPAZINE Take 1 tablet (10 mg total) by mouth every 6 (six) hours as needed for nausea or vomiting.   tamsulosin 0.4 MG Caps capsule Commonly known as:  FLOMAX TAKE 1 CAPSULE BY MOUTH DAILY   traZODone 50 MG tablet Commonly known as:  DESYREL Take 50 mg by mouth at bedtime.   Verapamil HCl CR 300 MG Cp24 Take 300 mg by mouth at bedtime.       Review of Systems  Constitutional: Negative for activity change, appetite change, chills, fatigue and fever.  HENT: Negative for congestion, rhinorrhea, sinus pain, sinus pressure, sneezing and sore throat.   Eyes: Negative.   Respiratory: Negative for cough, chest tightness, shortness of breath and wheezing.   Cardiovascular: Positive for leg swelling. Negative for chest pain and palpitations.  Gastrointestinal: Negative for abdominal distention, abdominal pain, constipation, diarrhea, nausea and vomiting.   Endocrine: Negative.   Genitourinary: Negative for dysuria, flank pain, frequency and urgency.  Musculoskeletal: Positive for gait problem.  Skin: Negative for color change, pallor and rash.  Neurological:  Negative for dizziness, seizures, syncope, light-headedness and headaches.  Hematological: Does not bruise/bleed easily.  Psychiatric/Behavioral: Negative for agitation, confusion, hallucinations and sleep disturbance. The patient is not nervous/anxious.     Vitals:   05/06/16 1028  BP: 134/87  Pulse: 73  Resp: 20  Temp: 99.4 F (37.4 C)  TempSrc: Oral  SpO2: 95%  Weight: 229 lb 3.2 oz (104 kg)  Height: 6\' 4"  (1.93 m)   Body mass index is 27.9 kg/m. Physical Exam  Constitutional: He is oriented to person, place, and time.  Thin tall elderly in no acute distress  HENT:  Head: Normocephalic.  Mouth/Throat: Oropharynx is clear and moist. No oropharyngeal exudate.  Eyes: Conjunctivae and EOM are normal. Right eye exhibits no discharge. Left eye exhibits no discharge. No scleral icterus.  Neck: Normal range of motion. No JVD present. No thyromegaly present.  Cardiovascular: Normal rate, regular rhythm, normal heart sounds and intact distal pulses.  Exam reveals no gallop and no friction rub.   No murmur heard. Pulmonary/Chest: Effort normal and breath sounds normal. No respiratory distress. He has no wheezes. He has no rales.  Abdominal: Soft. Bowel sounds are normal. He exhibits no distension. There is no tenderness. There is no rebound and no guarding.  Musculoskeletal:  Moves x 4 extremities. Unsteady gait uses FWW. Bilateral lower extremities trace -1+ edema.   Lymphadenopathy:    He has no cervical adenopathy.  Neurological: He is oriented to person, place, and time.  Skin: Skin is warm and dry. No rash noted. No erythema. No pallor.  Peg Tube site dry, clean and intact without any signs of infections.   Psychiatric: He has a normal mood and affect.    Labs  reviewed: Basic Metabolic Panel:  Recent Labs  04/01/16 0407 04/04/16 0442  04/18/16 0544 04/19/16 0451 04/20/16 0422  NA 139 136  < > 133* 135 134*  K 3.8 4.1  < > 5.0 5.0 5.0  CL 108 105  < > 112* 112* 111  CO2 22 22  < > 18* 20* 20*  GLUCOSE 81 89  < > 126* 116* 117*  BUN 24* 28*  < > 41* 32* 30*  CREATININE 1.69* 1.98*  < > 1.73* 1.63* 1.59*  CALCIUM 9.6 9.5  < > 9.7 9.9 10.1  PHOS 3.0 3.3  --   --   --   --   < > = values in this interval not displayed. Liver Function Tests:  Recent Labs  02/20/16 0853 03/12/16 1002 04/01/16 0407 04/04/16 0442 04/08/16 2219  AST 30 30  --   --  42*  ALT 20 21  --   --  24  ALKPHOS 46 48  --   --  46  BILITOT 0.60 0.42  --   --  0.5  PROT 6.9 6.9  --   --  7.2  ALBUMIN 2.9* 2.9* 2.2* 2.2* 2.8*    Recent Labs  11/27/15 0825 04/08/16 2219  LIPASE 49 128*   No results for input(s): AMMONIA in the last 8760 hours. CBC:  Recent Labs  02/20/16 0853 03/12/16 1001  04/08/16 2219 04/09/16 0348 04/11/16 0505  WBC 6.5 4.1  < > 9.8 9.3 6.1  NEUTROABS 4.5 1.9  --  7.8*  --   --   HGB 9.4* 9.1*  < > 10.0* 9.6* 8.2*  HCT 28.5* 28.3*  < > 30.7* 28.3* 24.6*  MCV 94.8 96.6  < > 93.0 92.5 95.0  PLT 131* 181  < >  268 247 186  < > = values in this interval not displayed.  Recent Labs  04/07/16 0402 04/07/16 0819 04/07/16 1159  GLUCAP 103* 135* 117*   Procedures and Imaging Studies: Ct Abdomen Pelvis Wo Contrast  Result Date: 04/08/2016 CLINICAL DATA:  Acute onset of generalized abdominal pain. Initial encounter. EXAM: CT ABDOMEN AND PELVIS WITHOUT CONTRAST TECHNIQUE: Multidetector CT imaging of the abdomen and pelvis was performed following the standard protocol without IV contrast. COMPARISON:  PET/CT performed 02/07/2016 FINDINGS: Lower chest: Mild bibasilar atelectasis is noted. Mild calcification is seen at the mitral valve. Hepatobiliary: The liver is unremarkable in appearance. The gallbladder is unremarkable in appearance.  The common bile duct remains normal in caliber. Pancreas: The pancreas is within normal limits. Spleen: The spleen is unremarkable in appearance. Adrenals/Urinary Tract: The adrenal glands are unremarkable in appearance. The kidneys are within normal limits. There is no evidence of hydronephrosis. No renal or ureteral stones are identified. No perinephric stranding is seen. Stomach/Bowel: The patient's G-tube is noted coiled within the stomach. The patient is status post partial gastrectomy. The distal duodenum is unremarkable in appearance. No focal small bowel abnormalities are seen. The small bowel is largely decompressed. The appendix is normal in caliber, without evidence of appendicitis. Scattered diverticulosis is noted along the descending and proximal sigmoid colon, without evidence of diverticulitis. Vascular/Lymphatic: Scattered calcification is seen along the abdominal aorta and its branches. The abdominal aorta is otherwise grossly unremarkable. The inferior vena cava is grossly unremarkable. No retroperitoneal lymphadenopathy is seen. No pelvic sidewall lymphadenopathy is identified. Reproductive: The bladder is mildly distended and contains trace air, raising question for recent Foley catheter placement. The prostate is borderline enlarged, measuring 4.9 cm in transverse dimension. 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 Musculoskeletal: No acute osseous abnormalities are identified. The visualized musculature is unremarkable in appearance. IMPRESSION: 1. 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 2. Status post partial gastrectomy. Remaining stomach is grossly unremarkable. G-tube is noted coiled within the stomach. 3. Borderline enlarged  prostate. 4. Scattered aortic atherosclerosis. 5. Mild bibasilar atelectasis. 6. Mild calcification at the mitral valve. 7. Scattered diverticulosis along the descending and proximal sigmoid colon, without evidence of diverticulitis. Electronically Signed   By: Garald Balding M.D.   On: 04/08/2016 23:48   Dg Abd Portable 1v  Result Date: 04/16/2016 CLINICAL DATA:  Nausea. EXAM: PORTABLE ABDOMEN - 1 VIEW COMPARISON:  CT 04/08/2016. FINDINGS: 0812 hours. Two views are submitted. Percutaneous gastrojejunostomy feeding tube is looped in the gastric fundus, no longer extending into the small bowel. This is unchanged from recent CT. The bowel gas pattern is nonobstructive. Relative displacement of bowel from the mid abdomen corresponds with omental infarct demonstrated on recent CT. Probable contrast retained within descending colonic diverticula as correlated with prior CT. No suspicious calcifications. The bones appear unremarkable. IMPRESSION: No acute findings. The GJ tube remains coiled in the stomach status post partial gastrectomy. Electronically Signed   By: Richardean Sale M.D.   On: 04/16/2016 08:47   Assessment/Plan:   1. Unsteady gait  Has worked well with PT/ OT. Will discharge home PT/OT to continue with ROM, Exercise, Gait stability and muscle strengthening. No DME required has own walker  at home.Fall and safety precautions.  2. Primary adenocarcinoma of pyloric antrum  post hospital from 03/2016/18-3/26/2018 with primary adenocarcinoma of pyloric antrum and underwent distal gastrectomy with feeding tube placement. Tolerating oral intake. Continue on Zofran, Compazine and Metoclopramide for nausea and vomiting. Will discharge with [facility] for Peg tube care management. Follow up with [facility] for post hospital follow up with possible removal of PEG tube. CBC in 1-2 weeks with PCP. Follow up with Oncology as directed.      3. Depression Stable. Continue on trazodone and Wellbutrin. Monitor  for mood changes.   4. Localized edema Bilateral lower extremities trace-1+ edema though has improved per patient. Continue on Furosemide 40 mg Tablet every other day. BMP in 1-2 weeks with PCP.   5. Insomnia Continue on Melatonin and Trazodone.   6. HTN  B/p stable. Continue on Verapamil, Furosemide and Bystolic. Continue to monitor. BMP in 1-2 weeks with PCP.     Patient is being discharged with the following home health services:   -PT/OT for ROM, exercise, gait stability and muscle strengthening  -  HH RN for PEG tube care management   Patient is being discharged with the following durable medical equipment:   - None required.  Patient has been advised to f/u with their PCP in 1-2 weeks to for a transitions of care visit.Social services at their facility was responsible for arranging this appointment.  Pt was provided with adequate prescriptions of noncontrolled medications to reach the scheduled appointment.For controlled substances, a limited supply was provided as appropriate for the individual patient. If the pt normally receives these medications from a pain clinic or has a contract with another physician, these medications should be received from that clinic or physician only).    Future labs/tests needed:  CBC, BMP in 1-2 weeks PCP

## 2016-05-15 ENCOUNTER — Ambulatory Visit (HOSPITAL_BASED_OUTPATIENT_CLINIC_OR_DEPARTMENT_OTHER): Payer: Medicare Other | Admitting: Oncology

## 2016-05-15 ENCOUNTER — Telehealth: Payer: Self-pay | Admitting: Oncology

## 2016-05-15 VITALS — BP 134/75 | HR 72 | Temp 97.8°F | Resp 18 | Ht 76.0 in | Wt 228.6 lb

## 2016-05-15 DIAGNOSIS — D500 Iron deficiency anemia secondary to blood loss (chronic): Secondary | ICD-10-CM | POA: Diagnosis not present

## 2016-05-15 DIAGNOSIS — D649 Anemia, unspecified: Secondary | ICD-10-CM | POA: Diagnosis not present

## 2016-05-15 DIAGNOSIS — C163 Malignant neoplasm of pyloric antrum: Secondary | ICD-10-CM | POA: Diagnosis not present

## 2016-05-15 DIAGNOSIS — C169 Malignant neoplasm of stomach, unspecified: Secondary | ICD-10-CM

## 2016-05-15 DIAGNOSIS — N289 Disorder of kidney and ureter, unspecified: Secondary | ICD-10-CM

## 2016-05-15 NOTE — Progress Notes (Signed)
Hematology and Oncology Follow Up Visit  Jacob [name] [id number] [birth year] 71 y.o. 05/15/2016 12:46 PM Jacob Cedar, MD   Principle Diagnosis:  71 year old gentleman with:  1.Normocytic, normochromic anemia diagnosed March 2017. His anemia is due to renal insufficiency with creatinine of 2.9 creatinine clearance 44 mL/m.  2. Prostate cancer in 2013 with a Gleason score 3+4 = 7 and a PSA 5.66 stage TIc.  3. 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 His staging by EUS showed T3 N2 disease without any evidence of metastatic disease.   Prior Therapy:  He is status post radiation therapy for definitive treatment for his prostate cancer. Therapy concluded in December 2013. He is status post packed red cell transfusions in April 2017. He is status post bone marrow biopsy in April 2017. Results did not show any plasma cell disorder or myelodysplasia. He is status post IV iron infusion completed in April 2017. This was repeated in September 2017. FOLFOX chemotherapy with cycle 1 to be given on 12/12/2015.  He is S/P cycle 5 of therapy given on 02/20/2016. He is status post distal gastrectomy and Billroth II reconstruction completed on 03/25/2016. The final pathology revealed a T3 N0 residual tumor without any lymphadenopathy involved.  Current therapy: Postoperative care and observation and surveillance.   Interim History:  Jacob [name] presents today for a follow-up visit. Since the last visit, he underwent a primary surgical therapy on 03/25/2016. He had a slow recovery process that required readmission and hospitalization. He was finally discharged to skilled nursing facility on 04/21/2016. He subsequently was discharged home last week. He is still recovering slowly but still able to ambulate with the help of home cane. He denied any falls or syncope. He is eating most foods at this time although struggling with certain meats. He still has  a PEG tube in place and has been flushed periodically but not used her nutrition.  He denied any nausea, vomiting or abdominal pain. He denied any dyspnea on exertion. His performance status is slowly improving.  He does not report any headaches, blurry vision, syncope or seizures. He does not report any fevers, chills, sweats or weight loss. Does not report any chest pain, palpitation, orthopnea or leg edema. He does not report any cough, wheezing or hemoptysis. He does not report any frequency, urgency or hesitancy. He does not report any skeletal complaints of arthralgias or myalgias. Remaining review of systems unremarkable.   Medications: I have reviewed the patient's current medications.  Current Outpatient Prescriptions  Medication Sig Dispense Refill  . acetaminophen (TYLENOL) 500 MG tablet Take 1,000 mg by mouth daily as needed for moderate pain or headache.    . allopurinol (ZYLOPRIM) 300 MG tablet Take 300 mg by mouth daily.     Marland Kitchen buPROPion (WELLBUTRIN XL) 300 MG 24 hr tablet Take 300 mg by mouth daily.     . calcium carbonate (TUMS - DOSED IN MG ELEMENTAL CALCIUM) 500 MG chewable tablet Chew 3 tablets by mouth 2 (two) times daily as needed for indigestion or heartburn.    . diazepam (VALIUM) 5 MG tablet Take 5 mg by mouth at bedtime as needed (sleep).     . furosemide (LASIX) 40 MG tablet Take 40 mg by mouth every other day.    . gabapentin (NEURONTIN) 800 MG tablet Take 800 mg by mouth at bedtime.     . lidocaine-prilocaine (EMLA) cream Apply 1 application topically as needed. Apply to port before chemotherapy. 30 g 0  .  Melatonin 10 MG TABS Take 1 tablet by mouth at bedtime as needed (sleep).     . metoCLOPramide (REGLAN) 5 MG/5ML solution Take 10 mLs (10 mg total) by mouth 4 (four) times daily -  before meals and at bedtime. 120 mL 0  . Nebivolol HCl (BYSTOLIC) 20 MG TABS Take 20 mg by mouth daily.     . Nutritional Supplements (ENSURE ENLIVE PO) Take by mouth.    . ondansetron  (ZOFRAN-ODT) 4 MG disintegrating tablet Take 1 tablet (4 mg total) by mouth every 6 (six) hours as needed for nausea. 20 tablet 3  . oxyCODONE (OXY IR/ROXICODONE) 5 MG immediate release tablet Take 1-2 tablets (5-10 mg total) by mouth every 6 (six) hours as needed for moderate pain, severe pain or breakthrough pain. 15 tablet 0  . OXYGEN Inhale 2 L into the lungs at bedtime as needed (for shortness of breath).    . pantoprazole (PROTONIX) 40 MG tablet Take 1 tablet (40 mg total) by mouth daily. 30 tablet 3  . polycarbophil (FIBERCON) 625 MG tablet Take 1 tablet (625 mg total) by mouth daily. 30 tablet 3  . prochlorperazine (COMPAZINE) 10 MG tablet Take 1 tablet (10 mg total) by mouth every 6 (six) hours as needed for nausea or vomiting. 30 tablet 0  . tamsulosin (FLOMAX) 0.4 MG CAPS capsule TAKE 1 CAPSULE BY MOUTH DAILY 30 capsule 2  . traZODone (DESYREL) 50 MG tablet Take 50 mg by mouth at bedtime.     . Verapamil HCl CR 300 MG CP24 Take 300 mg by mouth at bedtime.     . Water For Irrigation, Sterile (FREE WATER) SOLN Place 100 mLs into feeding tube every 4 (four) hours. 1000 mL 0   No current facility-administered medications for this visit.      Allergies:  No Known Allergies  Past Medical History, Surgical history, Social history, and Family History were reviewed and updated.   Physical Exam: Blood pressure 134/75, pulse 72, temperature 97.8 F (36.6 C), temperature source Oral, resp. rate 18, height 6' 4"  (1.93 m), weight 228 lb 9.6 oz (103.7 kg), SpO2 99 %. ECOG: 1 General appearance: Chronically ill-appearing gentleman appeared without major distress. Head: Normocephalic, without obvious abnormality no oral thrush or ulcers. Neck: no adenopathy Lymph nodes: Cervical, supraclavicular, and axillary nodes normal. Heart:regular rate and rhythm, S1, S2 normal, no murmur, click, rub or gallop Lung:chest clear, no wheezing, rales, normal symmetric air entry Abdomin: soft, non-tender,  without masses or organomegaly no rebound or guarding. Well-healed scar noted with a PEG tube in place. EXT:no erythema, induration, or nodules   Lab Results: Lab Results  Component Value Date   WBC 6.1 04/11/2016   HGB 8.2 (L) 04/11/2016   HCT 24.6 (L) 04/11/2016   MCV 95.0 04/11/2016   PLT 186 04/11/2016     Chemistry      Component Value Date/Time   NA 134 (L) 04/20/2016 0422   NA 138 03/12/2016 1002   K 5.0 04/20/2016 0422   K 4.5 03/12/2016 1002   CL 111 04/20/2016 0422   CO2 20 (L) 04/20/2016 0422   CO2 21 (L) 03/12/2016 1002   BUN 30 (H) 04/20/2016 0422   BUN 20.7 03/12/2016 1002   CREATININE 1.59 (H) 04/20/2016 0422   CREATININE 1.6 (H) 03/12/2016 1002      Component Value Date/Time   CALCIUM 10.1 04/20/2016 0422   CALCIUM 9.9 03/12/2016 1002   ALKPHOS 46 04/08/2016 2219   ALKPHOS 48 03/12/2016 1002  AST 42 (H) 04/08/2016 2219   AST 30 03/12/2016 1002   ALT 24 04/08/2016 2219   ALT 21 03/12/2016 1002   BILITOT 0.5 04/08/2016 2219   BILITOT 0.42 03/12/2016 1002       Impression and Plan:  71 year old woman with the following issues:  1. Normocytic, normochromic anemia presented initially with a hemoglobin of 9.2. These findings are multifactorial related to anemia of blood loss related to his tumor, malignancy and renal insufficiency. His hemoglobin is stable at this time and does not require packed red cell transfusion. We will continue to check periodically.  2. Antral mass that is biopsy proven to be adenocarcinoma. He is status post EUS and PET CT scan which confirmed a staging of about T3 N2 disease. No evidence of distant metastasis noted.  He is S/P 5 cycles of FOLFOX and tolerated well.  PET CT scan obtained on 02/07/2016 which showed excellent response to therapy but clearly with residual tumor.   He is status post distal gastrectomy which completed in 03/25/2016. He still has a large tumor without any lymph node involvement. The final  pathology showed T3 N0.   The risks and benefits of using adjuvant chemotherapy in this setting was discussed today. 3 more cycles of FOLFOX will be the ideal course of action although I feel that he is still rather weak and debilitated and I do not think he can withstand restarting chemotherapy.  I have recommended continued observation and surveillance for the time being and repeat imaging studies in 2 months. He'll have a PET scan at that time.   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 His last protein studies obtained in September 2017 showed no major changes and will be repeated annually.  4. Renal insufficiency: His creatinine post surgery have improved to baseline. His creatinine was 1.59 on April 8.  5. IV access: Port-A-Cath remains in place at the time being. It will be flushed every 8 weeks.  6. Nutrition: He appears to be doing reasonably well with oral intake. PEG tube remains in place and likely will be removed per directions from Dr. Barry Dienes  7. Thrombocytopenia: This is chemotherapy related she has resolved at this time. Normal platelet counts noted on 04/11/2016.  8. Follow-up: In the next 2 months to discuss the results of his scan.   Zola Button, MD 5/3/201812:46 PM

## 2016-05-15 NOTE — Telephone Encounter (Signed)
Gave patient AVS and calender per 5/3 los.  

## 2016-05-16 ENCOUNTER — Encounter: Payer: Self-pay | Admitting: *Deleted

## 2016-05-23 ENCOUNTER — Encounter: Payer: Self-pay | Admitting: *Deleted

## 2016-05-31 ENCOUNTER — Emergency Department (HOSPITAL_COMMUNITY)
Admission: EM | Admit: 2016-05-31 | Discharge: 2016-05-31 | Disposition: A | Discharge status: Home / Self Care | Payer: Medicare Other | Attending: Emergency Medicine | Admitting: Emergency Medicine

## 2016-05-31 ENCOUNTER — Encounter (HOSPITAL_COMMUNITY): Payer: Self-pay | Admitting: Emergency Medicine

## 2016-05-31 DIAGNOSIS — Z87891 Personal history of nicotine dependence: Secondary | ICD-10-CM | POA: Insufficient documentation

## 2016-05-31 DIAGNOSIS — I129 Hypertensive chronic kidney disease with stage 1 through stage 4 chronic kidney disease, or unspecified chronic kidney disease: Secondary | ICD-10-CM | POA: Diagnosis not present

## 2016-05-31 DIAGNOSIS — N189 Chronic kidney disease, unspecified: Secondary | ICD-10-CM | POA: Insufficient documentation

## 2016-05-31 DIAGNOSIS — Z8546 Personal history of malignant neoplasm of prostate: Secondary | ICD-10-CM | POA: Insufficient documentation

## 2016-05-31 DIAGNOSIS — Z79899 Other long term (current) drug therapy: Secondary | ICD-10-CM | POA: Diagnosis not present

## 2016-05-31 DIAGNOSIS — T85598A Other mechanical complication of other gastrointestinal prosthetic devices, implants and grafts, initial encounter: Secondary | ICD-10-CM

## 2016-05-31 DIAGNOSIS — K9423 Gastrostomy malfunction: Secondary | ICD-10-CM | POA: Diagnosis not present

## 2016-05-31 DIAGNOSIS — Z85028 Personal history of other malignant neoplasm of stomach: Secondary | ICD-10-CM | POA: Diagnosis not present

## 2016-05-31 NOTE — ED Triage Notes (Signed)
Per EMS, pt was changing dressing around feeding tube and accidentally pulled it out. Pt denies pain at this time and says it feels better now that it's out. Pt presents with broken skin around stoma. No bleeding or exudate.  Pt has no other complaint and is not in distress at this time.

## 2016-05-31 NOTE — Discharge Instructions (Signed)
Keep area clean and bandaged.  You will have drainage for a couple of weeks.

## 2016-05-31 NOTE — ED Notes (Signed)
Bed: WA12 Expected date:  Expected time:  Means of arrival:  Comments: 

## 2016-05-31 NOTE — ED Provider Notes (Signed)
[facility] DEPT Provider Note   CSN: [id number] Arrival date & time: 05/31/16  1441     History   Chief Complaint Chief Complaint  Patient presents with  . Accidental Feeding Tube Removal    HPI Jacob [name] is a 71 y.o. male.  Pt reports his feeding tube pulled out.  Pt reports it was stuck to dressing and he pulled dressing off.  Pt reports he does not use feeding tube.  He wants to leave it out.  Pt has a GJ tube at bedside,  balloon is still inflated, suture still around tube.  Pt has a history of stomach cancer.  No fever, no chills, no nausea or vomiting.    The history is provided by the patient. No language interpreter was used.    Past Medical History:  Diagnosis Date  . Anemia    hx iron deficiency  . Anxiety   . Arthritis    gout  . Back pain   . BPH with obstruction/lower urinary tract symptoms   . Chronic kidney disease    "they said I do"  . Constipation   . Depression   . ED (erectile dysfunction)   . Epigastric pain   . GERD (gastroesophageal reflux disease)   . Heartburn   . History of blood transfusion   . History of radiation therapy 11/03/11-12/29/11   prostate  . Hypercholesterolemia   . Hypertension   . Night sweats   . Post-operative nausea and vomiting 04/09/2016  . Prostate cancer ([place]) 08/14/11   Adenocarcinoma,gleason:3+3=6,& 3+4=7,PSA=5.66  . PUD (peptic ulcer disease)   . Sleep apnea    does not use Cpap but does use O2 @ 2l   . Stomach cancer ([place])   . Ulcer    peptic ulcer hx    Patient Active Problem List   Diagnosis Date Noted  . Post-operative nausea and vomiting 04/09/2016  . 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 Genetic testing 12/19/2015  . Stomach cancer ([place])   . Gastric adenocarcinoma ([place])   . Gastric mass   . GI bleed 10/26/2015  . Gastrointestinal hemorrhage   . PUD (peptic ulcer disease)   . 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 Nocturnal hypoxemia 11/04/2013  .  Noncompliance with treatment 09/02/2013  . Prostate cancer ([place]) 08/14/2011  . Iron deficiency anemia 03/19/2007  . DEPRESSION 03/19/2007  . Essential hypertension 03/19/2007  . NEOPLASM, BENIGN, STOMACH 11/23/2006  . POLYP, COLON 11/23/2006  . INTERNAL HEMORRHOIDS 11/23/2006  . GASTRITIS 11/23/2006    Past Surgical History:  Procedure Laterality Date  . colon polyps bx  11/24/06   colon,transverse and rectosigmoid polyps:tubular adenomas and hyperplastic polyps,no high grade dysplasia or malignancy   . duodenal bx  11/24/06   benign  . ESOPHAGOGASTRODUODENOSCOPY N/A 10/27/2015   Procedure: ESOPHAGOGASTRODUODENOSCOPY (EGD);  Surgeon: [name], MD;  Location: [facility];  Service: Endoscopy;  Laterality: N/A;  . EUS N/A 11/08/2015   Procedure: UPPER ENDOSCOPIC ULTRASOUND (EUS) RADIAL;  Surgeon: [name], MD;  Location: [facility];  Service: Endoscopy;  Laterality: N/A;  . GASTRECTOMY N/A 03/25/2016   Procedure: DISTAL GASTRECTOMY;  Surgeon: [name], MD;  Location: [facility];  Service: General;  Laterality: N/A;  . gastric bx  11/24/06   chronic active gastritis,with metaplasia and focal changaes of xanthelasma  . GASTROSTOMY N/A 03/25/2016   Procedure: INSERTION OF FEEDING TUBE;  Surgeon: [name], MD;  Location: [facility];  Service: General;  Laterality: N/A;  .  INSERTION PROSTATE RADIATION SEED  12-29-11  . IR GENERIC HISTORICAL  04/01/2016   IR GJ TUBE CHANGE 04/01/2016 Markus Daft, MD [facility]  . IR GENERIC HISTORICAL  04/05/2016   IR GJ TUBE CHANGE 04/05/2016 Markus Daft, MD [facility]  . LAPAROSCOPIC GASTRECTOMY  03/25/2016   Diagnostic laparoscopy, distal gastrectomy with Billroth 2 reconstruction and gastrojejunostomy tube  . LAPAROSCOPY N/A 03/25/2016   Procedure: DIAGNOSTIC LAPAROSCOPY;  Surgeon: [name], MD;  Location: [facility];  Service: General;  Laterality: N/A;  . PORTACATH PLACEMENT Left 12/04/2015   Procedure: INSERTION PORT-A-CATH;  Surgeon:  [name], MD;  Location: [facility];  Service: General;  Laterality: Left;  . PROSTATE BIOPSY  08/14/11   Adenocarcinoma/volume=58.51cc,gleason=3+3=6 & 3+4=7  . TONSILLECTOMY     71 years old       Home Medications    Prior to Admission medications   Medication Sig Start Date End Date Taking? Authorizing Provider  acetaminophen (TYLENOL) 500 MG tablet Take 1,000 mg by mouth daily as needed for moderate pain or headache.    [provider]  allopurinol (ZYLOPRIM) 300 MG tablet Take 300 mg by mouth daily.  05/10/15   [provider]  buPROPion (WELLBUTRIN XL) 300 MG 24 hr tablet Take 300 mg by mouth daily.  05/10/15   [provider]  calcium carbonate (TUMS - DOSED IN MG ELEMENTAL CALCIUM) 500 MG chewable tablet Chew 3 tablets by mouth 2 (two) times daily as needed for indigestion or heartburn.    [provider]  diazepam (VALIUM) 5 MG tablet Take 5 mg by mouth at bedtime as needed (sleep).     [provider]  furosemide (LASIX) 40 MG tablet Take 40 mg by mouth every other day.    [provider]  gabapentin (NEURONTIN) 800 MG tablet Take 800 mg by mouth at bedtime.     [provider]  lidocaine-prilocaine (EMLA) cream Apply 1 application topically as needed. Apply to port before chemotherapy. 11/22/15   Wyatt Portela, MD  Melatonin 10 MG TABS Take 1 tablet by mouth at bedtime as needed (sleep).     [provider]  metoCLOPramide (REGLAN) 5 MG/5ML solution Take 10 mLs (10 mg total) by mouth 4 (four) times daily -  before meals and at bedtime. 04/21/16   [name], MD  Nebivolol HCl (BYSTOLIC) 20 MG TABS Take 20 mg by mouth daily.     [provider]  Nutritional Supplements (ENSURE ENLIVE PO) Take by mouth.    [provider]  ondansetron (ZOFRAN-ODT) 4 MG disintegrating tablet Take 1 tablet (4 mg total) by mouth every 6 (six) hours as needed for nausea. 04/21/16   [name], MD  oxyCODONE (OXY  IR/ROXICODONE) 5 MG immediate release tablet Take 1-2 tablets (5-10 mg total) by mouth every 6 (six) hours as needed for moderate pain, severe pain or breakthrough pain. 04/21/16   [name], MD  OXYGEN Inhale 2 L into the lungs at bedtime as needed (for shortness of breath).    [provider]  pantoprazole (PROTONIX) 40 MG tablet Take 1 tablet (40 mg total) by mouth daily. 04/21/16   [name], MD  polycarbophil (FIBERCON) 625 MG tablet Take 1 tablet (625 mg total) by mouth daily. 04/21/16   [name], MD  prochlorperazine (COMPAZINE) 10 MG tablet Take 1 tablet (10 mg total) by mouth every 6 (six) hours as needed for nausea or vomiting. 11/22/15   Wyatt Portela, MD  tamsulosin (  FLOMAX) 0.4 MG CAPS capsule TAKE 1 CAPSULE BY MOUTH DAILY 06/19/14   Arloa Koh, MD  traZODone (DESYREL) 50 MG tablet Take 50 mg by mouth at bedtime.  05/10/15   [provider]  Verapamil HCl CR 300 MG CP24 Take 300 mg by mouth at bedtime.  04/01/15   [provider]  Water For Irrigation, Sterile (FREE WATER) SOLN Place 100 mLs into feeding tube every 4 (four) hours. 04/21/16   [name], MD    Family History Family History  Problem Relation Age of Onset  . Cancer Mother        NOS  . Alcohol abuse Father   . Alcohol abuse Brother 50  . Cancer Paternal Aunt        NOS  . Colon cancer Neg Hx     Social History Social History  Substance Use Topics  . Smoking status: Former Smoker    Packs/day: 1.50    Years: 30.00    Types: Cigarettes    Quit date: 04/13/2000  . Smokeless tobacco: Never Used  . Alcohol use 0.6 oz/week    1 Cans of beer per week     Comment: 1-2 beers every other night     Allergies   Patient has no known allergies.   Review of Systems Review of Systems  Constitutional: Negative for chills and fever.  Gastrointestinal: Negative for abdominal distention, nausea and vomiting.  Skin: Negative for color change.  Neurological: Negative for speech  difficulty.  All other systems reviewed and are negative.    Physical Exam Updated Vital Signs BP (!) 136/93 (BP Location: Left Arm)   Pulse 86   Temp 97.9 F (36.6 C) (Oral)   Resp 16   Ht 6\' 2"  (1.88 m)   Wt 225 lb (102.1 kg)   SpO2 100%   BMI 28.89 kg/m   Physical Exam  Constitutional: He appears well-developed and well-nourished.  HENT:  Head: Normocephalic and atraumatic.  Eyes: Conjunctivae are normal.  Neck: Neck supple.  Cardiovascular: Normal rate and regular rhythm.   No murmur heard. Pulmonary/Chest: Effort normal and breath sounds normal. No respiratory distress.  Abdominal: Soft. There is no tenderness.  Erythema around site,  Dried blood   Musculoskeletal: He exhibits no edema.  Neurological: He is alert.  Skin: Skin is warm and dry.  Psychiatric: He has a normal mood and affect.  Nursing note and vitals reviewed.    ED Treatments / Results  Labs (all labs ordered are listed, but only abnormal results are displayed) Labs Reviewed - No data to display  EKG  EKG Interpretation None       Radiology No results found.  Procedures Procedures (including critical care time)  Medications Ordered in ED Medications - No data to display   Initial Impression / Assessment and Plan / ED Course  I have reviewed the triage vital signs and the nursing notes.  Pertinent labs & imaging results that were available during my care of the patient were reviewed by me and considered in my medical decision making (see chart for details).     Dr. Rex Kras in to see.   I reviewed notes,  Dr. Barry Dienes and oncologist had discussed removing tube at some point.   I spoke with Dr. Donne Hazel,  He advised can replace with foley,  He reports area will heal if left out.   He advised area will close up.  I spoke with pt and his family.   He does not  want tube replaced. I advised him tract will close.  He understands he could have to have another tube placed in the future. He  reports he has had problems with tube and it feels better out.  Pt counseled on drainage and wound care  Final Clinical Impressions(s) / ED Diagnoses   Final diagnoses:  Feeding tube dysfunction, initial encounter    New Prescriptions New Prescriptions   No medications on file  An After Visit Summary was printed and given to the patient.   Fransico Meadow, [place] 05/31/16 [place], Wenda Overland, MD 06/02/16 [phone]

## 2016-05-31 NOTE — ED Notes (Signed)
Bed: SK87 Expected date:  Expected time:  Means of arrival: Ambulance Comments: Feeding Tube out

## 2016-06-26 ENCOUNTER — Telehealth: Payer: Self-pay | Admitting: Oncology

## 2016-06-26 NOTE — Telephone Encounter (Signed)
Scheduled appt per message from [place] to [place]. - Left message with appt date and time and sent reminder letter.

## 2016-06-30 ENCOUNTER — Encounter: Payer: Self-pay | Admitting: Nutrition

## 2016-06-30 ENCOUNTER — Encounter: Payer: Medicare Other | Admitting: Nutrition

## 2016-06-30 NOTE — Progress Notes (Signed)
Patient did not show up for scheduled nutrition appointment. 

## 2016-07-09 ENCOUNTER — Other Ambulatory Visit (HOSPITAL_BASED_OUTPATIENT_CLINIC_OR_DEPARTMENT_OTHER): Payer: Medicare Other

## 2016-07-09 ENCOUNTER — Ambulatory Visit (HOSPITAL_BASED_OUTPATIENT_CLINIC_OR_DEPARTMENT_OTHER): Payer: Medicare Other

## 2016-07-09 DIAGNOSIS — Z452 Encounter for adjustment and management of vascular access device: Secondary | ICD-10-CM | POA: Diagnosis not present

## 2016-07-09 DIAGNOSIS — N189 Chronic kidney disease, unspecified: Principal | ICD-10-CM

## 2016-07-09 DIAGNOSIS — C169 Malignant neoplasm of stomach, unspecified: Secondary | ICD-10-CM

## 2016-07-09 DIAGNOSIS — D631 Anemia in chronic kidney disease: Secondary | ICD-10-CM

## 2016-07-09 LAB — COMPREHENSIVE METABOLIC PANEL
ALT: 26 U/L (ref 0–55)
AST: 25 U/L (ref 5–34)
Albumin: 3.2 g/dL — ABNORMAL LOW (ref 3.5–5.0)
Alkaline Phosphatase: 44 U/L (ref 40–150)
Anion Gap: 8 mEq/L (ref 3–11)
BUN: 28.7 mg/dL — ABNORMAL HIGH (ref 7.0–26.0)
CHLORIDE: 103 meq/L (ref 98–109)
CO2: 26 meq/L (ref 22–29)
Calcium: 10.8 mg/dL — ABNORMAL HIGH (ref 8.4–10.4)
Creatinine: 2.1 mg/dL — ABNORMAL HIGH (ref 0.7–1.3)
EGFR: 35 mL/min/{1.73_m2} — AB (ref >90)
GLUCOSE: 96 mg/dL (ref 70–140)
POTASSIUM: 4.7 meq/L (ref 3.5–5.1)
SODIUM: 137 meq/L (ref 136–145)
Total Bilirubin: 0.46 mg/dL (ref 0.20–1.20)
Total Protein: 7.4 g/dL (ref 6.4–8.3)

## 2016-07-09 LAB — CBC WITH DIFFERENTIAL/PLATELET
BASO%: 0.4 % (ref 0.0–2.0)
Basophils Absolute: 0 10*3/uL (ref 0.0–0.1)
EOS ABS: 0.4 10*3/uL (ref 0.0–0.5)
EOS%: 5.3 % (ref 0.0–7.0)
HCT: 28.6 % — ABNORMAL LOW (ref 38.4–49.9)
HGB: 9.2 g/dL — ABNORMAL LOW (ref 13.0–17.1)
LYMPH%: 13 % — AB (ref 14.0–49.0)
MCH: 30.8 pg (ref 27.2–33.4)
MCHC: 32.2 g/dL (ref 32.0–36.0)
MCV: 95.7 fL (ref 79.3–98.0)
MONO#: 0.6 10*3/uL (ref 0.1–0.9)
MONO%: 8.4 % (ref 0.0–14.0)
NEUT#: 5.6 10*3/uL (ref 1.5–6.5)
NEUT%: 72.9 % (ref 39.0–75.0)
Platelets: 176 10*3/uL (ref 140–400)
RBC: 2.99 10*6/uL — AB (ref 4.20–5.82)
RDW: 17.5 % — ABNORMAL HIGH (ref 11.0–14.6)
WBC: 7.6 10*3/uL (ref 4.0–10.3)
lymph#: 1 10*3/uL (ref 0.9–3.3)

## 2016-07-09 MED ORDER — SODIUM CHLORIDE 0.9 % IJ SOLN
10.0000 mL | INTRAMUSCULAR | Status: DC | PRN
  Administered 2016-07-09: 10 mL
  Filled 2016-07-09: qty 10

## 2016-07-09 MED ORDER — HEPARIN SOD (PORK) LOCK FLUSH 100 UNIT/ML IV SOLN
500.0000 [IU] | Freq: Once | INTRAVENOUS | Status: AC | PRN
  Administered 2016-07-09: 500 [IU]
  Filled 2016-07-09: qty 5

## 2016-07-09 NOTE — Patient Instructions (Signed)

## 2016-07-11 ENCOUNTER — Ambulatory Visit (HOSPITAL_BASED_OUTPATIENT_CLINIC_OR_DEPARTMENT_OTHER): Payer: Medicare Other | Admitting: Oncology

## 2016-07-11 ENCOUNTER — Telehealth: Payer: Self-pay | Admitting: Oncology

## 2016-07-11 VITALS — BP 126/78 | HR 72 | Temp 98.8°F | Resp 18 | Ht 74.0 in | Wt 226.1 lb

## 2016-07-11 DIAGNOSIS — Z8546 Personal history of malignant neoplasm of prostate: Secondary | ICD-10-CM

## 2016-07-11 DIAGNOSIS — C61 Malignant neoplasm of prostate: Secondary | ICD-10-CM

## 2016-07-11 DIAGNOSIS — N289 Disorder of kidney and ureter, unspecified: Secondary | ICD-10-CM

## 2016-07-11 DIAGNOSIS — Z85028 Personal history of other malignant neoplasm of stomach: Secondary | ICD-10-CM

## 2016-07-11 DIAGNOSIS — D649 Anemia, unspecified: Secondary | ICD-10-CM

## 2016-07-11 DIAGNOSIS — C169 Malignant neoplasm of stomach, unspecified: Secondary | ICD-10-CM

## 2016-07-11 NOTE — Telephone Encounter (Signed)
Scheduled appt per 6/29 los - Gave patient AVS and calender per los.  

## 2016-07-11 NOTE — Progress Notes (Signed)
Hematology and Oncology Follow Up Visit  Jacob [name] [id number] October 31, 1945 71 y.o. 07/11/2016 11:09 AM [name], Jacob Em, MD   Principle Diagnosis:  71 year old gentleman with:  1.Normocytic, normochromic anemia diagnosed March 2017. His anemia is due to renal insufficiency with creatinine of 2.9 creatinine clearance 44 mL/m.  2. Prostate cancer in 2013 with a Gleason score 3+4 = 7 and a PSA 5.66 stage TIc.  3. 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 His staging by EUS showed T3 N2 disease without any evidence of metastatic disease.   Prior Therapy:  He is status post radiation therapy for definitive treatment for his prostate cancer. Therapy concluded in December 2013. He is status post packed red cell transfusions in April 2017. He is status post bone marrow biopsy in April 2017. Results did not show any plasma cell disorder or myelodysplasia. He is status post IV iron infusion completed in April 2017. This was repeated in September 2017. FOLFOX chemotherapy with cycle 1 to be given on 12/12/2015.  He is S/P cycle 5 of therapy given on 02/20/2016. He is status post distal gastrectomy and Billroth II reconstruction completed on 03/25/2016. The final pathology revealed a T3 N0 residual tumor without any lymphadenopathy involved.  Current therapy: Postoperative care and observation and surveillance.   Interim History:  Jacob [name] presents today for a follow-up visit. Since the last visit, he he continues to recover from his operation including gaining weight. He is ambulating without difficulties and had not had any falls or syncope. He gained most activities of daily living. He denied any abdominal pain or distention but does report occasional constipation. He is able to drive and make it to his appointment at this time. He denied any hematochezia or melena. He denied progressive fatigue or tiredness.  He does not report any  headaches, blurry vision, syncope or seizures. He does not report any fevers, chills, sweats or weight loss. Does not report any chest pain, palpitation, orthopnea or leg edema. He does not report any cough, wheezing or hemoptysis. He does not report any frequency, urgency or hesitancy. He does not report any skeletal complaints of arthralgias or myalgias. Remaining review of systems unremarkable.   Medications: I have reviewed the patient's current medications.  Current Outpatient Prescriptions  Medication Sig Dispense Refill  . acetaminophen (TYLENOL) 500 MG tablet Take 1,000 mg by mouth daily as needed for moderate pain or headache.    . allopurinol (ZYLOPRIM) 300 MG tablet Take 300 mg by mouth daily.     Marland Kitchen buPROPion (WELLBUTRIN XL) 300 MG 24 hr tablet Take 300 mg by mouth daily.     . calcium carbonate (TUMS - DOSED IN MG ELEMENTAL CALCIUM) 500 MG chewable tablet Chew 3 tablets by mouth 2 (two) times daily as needed for indigestion or heartburn.    . diazepam (VALIUM) 5 MG tablet Take 5 mg by mouth at bedtime as needed (sleep).     . furosemide (LASIX) 40 MG tablet Take 40 mg by mouth every other day.    . gabapentin (NEURONTIN) 800 MG tablet Take 800 mg by mouth at bedtime.     . lidocaine-prilocaine (EMLA) cream Apply 1 application topically as needed. Apply to port before chemotherapy. 30 g 0  . Melatonin 10 MG TABS Take 1 tablet by mouth at bedtime as needed (sleep).     . metoCLOPramide (REGLAN) 5 MG/5ML solution Take 10 mLs (10 mg total) by mouth 4 (four) times daily -  before meals  and at bedtime. 120 mL 0  . Nebivolol HCl (BYSTOLIC) 20 MG TABS Take 20 mg by mouth daily.     . Nutritional Supplements (ENSURE ENLIVE PO) Take 237 mLs by mouth 2 (two) times daily.     . ondansetron (ZOFRAN-ODT) 4 MG disintegrating tablet Take 1 tablet (4 mg total) by mouth every 6 (six) hours as needed for nausea. 20 tablet 3  . oxyCODONE (OXY IR/ROXICODONE) 5 MG immediate release tablet Take 1-2 tablets  (5-10 mg total) by mouth every 6 (six) hours as needed for moderate pain, severe pain or breakthrough pain. 15 tablet 0  . OXYGEN Inhale 2 L into the lungs at bedtime as needed (for shortness of breath).    . pantoprazole (PROTONIX) 40 MG tablet Take 1 tablet (40 mg total) by mouth daily. 30 tablet 3  . polycarbophil (FIBERCON) 625 MG tablet Take 1 tablet (625 mg total) by mouth daily. 30 tablet 3  . prochlorperazine (COMPAZINE) 10 MG tablet Take 1 tablet (10 mg total) by mouth every 6 (six) hours as needed for nausea or vomiting. 30 tablet 0  . tamsulosin (FLOMAX) 0.4 MG CAPS capsule TAKE 1 CAPSULE BY MOUTH DAILY 30 capsule 2  . traZODone (DESYREL) 50 MG tablet Take 50 mg by mouth at bedtime.     . Verapamil HCl CR 300 MG CP24 Take 300 mg by mouth at bedtime.     . Water For Irrigation, Sterile (FREE WATER) SOLN Place 100 mLs into feeding tube every 4 (four) hours. 1000 mL 0   No current facility-administered medications for this visit.      Allergies:  No Known Allergies  Past Medical History, Surgical history, Social history, and Family History were reviewed and updated.   Physical Exam: Blood pressure 126/78, pulse 72, temperature 98.8 F (37.1 C), temperature source Oral, resp. rate 18, height _0  (1.88 m), weight 226 lb 1.6 oz (102.6 kg), SpO2 97 %. ECOG: 1 General appearance: Alert, awake gentleman without distress. Head: Normocephalic, without obvious abnormality no oral ulcers or thrush. Neck: no adenopathy Lymph nodes: Cervical, supraclavicular, and axillary nodes normal. Heart:regular rate and rhythm, S1, S2 normal, no murmur, click, rub or gallop Lung:chest clear, no wheezing, rales, normal symmetric air entry Abdomin: soft, non-tender, without masses or organomegaly no rebound or guarding. Feeding tube has been removed. EXT:no erythema, induration, or nodules   Lab Results: Lab Results  Component Value Date   WBC 7.6 07/09/2016   HGB 9.2 (L) 07/09/2016   HCT 28.6  (L) 07/09/2016   MCV 95.7 07/09/2016   PLT 176 07/09/2016     Chemistry      Component Value Date/Time   NA 137 07/09/2016 1013   K 4.7 07/09/2016 1013   CL 111 04/20/2016 0422   CO2 26 07/09/2016 1013   BUN 28.7 (H) 07/09/2016 1013   CREATININE 2.1 (H) 07/09/2016 1013      Component Value Date/Time   CALCIUM 10.8 (H) 07/09/2016 1013   ALKPHOS 44 07/09/2016 1013   AST 25 07/09/2016 1013   ALT 26 07/09/2016 1013   BILITOT 0.46 07/09/2016 1013       Impression and Plan:  71 year old woman with the following issues:  1. Normocytic, normochromic anemia presented initially with a hemoglobin of 9.2. These findings are multifactorial related to anemia of blood loss related to his tumor, malignancy and renal insufficiency. His hemoglobin is stable at this time and does not require packed red cell transfusion. We will continue to check  periodically.  2. Antral mass that is biopsy proven to be adenocarcinoma. He is status post EUS and PET CT scan which confirmed a staging of about T3 N2 disease. No evidence of distant metastasis noted.  He is S/P 5 cycles of FOLFOX and tolerated well.  PET CT scan obtained on 02/07/2016 which showed excellent response to therapy but clearly with residual tumor.   He is status post distal gastrectomy which completed in 03/25/2016. He still has a large tumor without any lymph node involvement. The final pathology showed T3 N0.   He is scheduled for a staging workup including a PET scan on July 2. If he continues to be disease-free, we will continue with observation and surveillance. We will reinstitute chemotherapy develop some systemic disease.   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 His last protein studies obtained in September 2017 showed no major changes and will be repeated annually.  4. Renal insufficiency: His creatinine post surgery have improved to baseline.   5. IV access:  Port-A-Cath remains in place at the time being. It will be flushed every 8 weeks.  6. Nutrition: he is eating better at without feeding tube.  7. Anemia: Appears to be related to renal insufficiency at this time. We'll continue to monitor him and restart growth factor support if needed to.  8. Follow-up: In the next 2 months to follow his progress.   [facility], MD 6/29/201811:09 AM

## 2016-07-14 ENCOUNTER — Ambulatory Visit (HOSPITAL_COMMUNITY)
Admission: RE | Admit: 2016-07-14 | Discharge: 2016-07-14 | Disposition: A | Discharge status: Home / Self Care | Payer: Medicare Other | Source: Ambulatory Visit | Attending: Oncology | Admitting: Oncology

## 2016-07-14 DIAGNOSIS — I2510 Atherosclerotic heart disease of native coronary artery without angina pectoris: Secondary | ICD-10-CM | POA: Diagnosis not present

## 2016-07-14 DIAGNOSIS — R918 Other nonspecific abnormal finding of lung field: Secondary | ICD-10-CM | POA: Diagnosis not present

## 2016-07-14 DIAGNOSIS — R935 Abnormal findings on diagnostic imaging of other abdominal regions, including retroperitoneum: Secondary | ICD-10-CM | POA: Insufficient documentation

## 2016-07-14 DIAGNOSIS — C169 Malignant neoplasm of stomach, unspecified: Secondary | ICD-10-CM | POA: Diagnosis present

## 2016-07-14 DIAGNOSIS — I700 Atherosclerosis of aorta: Secondary | ICD-10-CM | POA: Insufficient documentation

## 2016-07-14 DIAGNOSIS — Z903 Acquired absence of stomach [part of]: Secondary | ICD-10-CM | POA: Diagnosis not present

## 2016-07-14 LAB — GLUCOSE, CAPILLARY: GLUCOSE-CAPILLARY: 82 mg/dL (ref 65–99)

## 2016-07-14 IMAGING — PT NM PET TUM IMG RESTAG (PS) SKULL BASE T - THIGH
1 of 8 series · 1 of 25 positions shown · non-contrast
Comparison: CT abdomen pelvis [DATE] and PET [DATE].

CLINICAL DATA: Subsequent treatment strategy for gastric cancer in.

EXAM:
NUCLEAR MEDICINE PET SKULL BASE TO THIGH
TECHNIQUE: 11.2 mCi F-18 FDG was injected intravenously. Full-ring PET imaging
was performed from the skull base to thigh after the radiotracer. CT
data was obtained and used for attenuation correction and anatomic
localization.
FASTING BLOOD GLUCOSE:  Value: 8 2 mg/dl

[Series 4: ct sk_thigh 5.0 b31f · axial · 5.0mm · 0.98mm/px · 1 of 237 slices shown]
[im 237/237  brain]
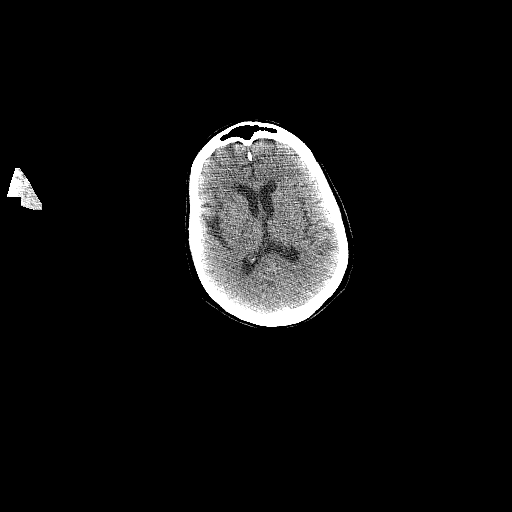

[1 of 25 positions shown; findings below may reference images not displayed]

FINDINGS: NECK

No hypermetabolic lymph nodes in the neck. Retention cyst or polyp
in the left maxillary sinus.

CHEST

No hypermetabolic mediastinal, hilar or axillary lymph nodes. No
hypermetabolic pulmonary nodules. A 1.4 cm nodule in the anterior
left chest wall is stable in size but is no longer hypermetabolic,
with an SUV max of 1.4, compared to 2.8 previously. Left subclavian
Port-A-Cath terminates in the SVC. Coronary artery calcification. No
pericardial or pleural effusion. Mild patchy peribronchovascular
ground-glass in the right upper lobe is new and likely infectious or
inflammatory in etiology.

ABDOMEN/PELVIS

There is hypermetabolism within the proximal stomach, which may be
physiologic. Partial gastrectomy. Peripheral hypermetabolism is seen
around a large area of circumscribed omental fat and haziness, deep
to a midline incisional scar measuring roughly 7.3 x 13.9 cm,
decreased slightly from 8.0 x 15.7 cm on [DATE]. No abnormal
hypermetabolism in the liver, adrenal glands, spleen or pancreas. No
hypermetabolic lymph nodes. Liver, gallbladder and adrenal glands
are unremarkable. Low-attenuation lesions in the kidneys measure up
to 5.9 cm on the right. At least 1 has peripheral calcification on
the right. Lesions are difficult to further characterize due to size
and/or lack of post-contrast imaging. Spleen, pancreas, stomach and
bowel are grossly unremarkable. Atherosclerotic calcification of the
arterial vasculature. Left inguinal hernia contains fat.

SKELETON

No abnormal osseous hypermetabolism. Degenerative changes in the
spine.
IMPRESSION: 1. Partial gastrectomy. Proximal gastric hypermetabolism may be
physiologic. No evidence of metastatic disease.
2. Soft tissue nodule in the anterior left chest wall is no longer
hypermetabolic.
3. Probable large area of omental infarction in the ventral abdomen,
deep to an incisional scar, with associated peripheral
hypermetabolism. Lesion has decreased minimally in size from
[DATE].
4. Aortic atherosclerosis ([V9]-170.0). Coronary artery
calcification.
5. Mild patchy peribronchovascular ground-glass in the right upper
lobe is new and likely infectious or inflammatory in etiology.

## 2016-07-14 MED ORDER — FLUDEOXYGLUCOSE F - 18 (FDG) INJECTION
11.2000 | Freq: Once | INTRAVENOUS | Status: AC | PRN
  Administered 2016-07-14: 11.2 via INTRAVENOUS

## 2016-07-15 ENCOUNTER — Encounter: Payer: Self-pay | Admitting: Podiatry

## 2016-07-15 ENCOUNTER — Ambulatory Visit (INDEPENDENT_AMBULATORY_CARE_PROVIDER_SITE_OTHER): Payer: Medicare Other | Admitting: Podiatry

## 2016-07-15 ENCOUNTER — Telehealth: Payer: Self-pay | Admitting: *Deleted

## 2016-07-15 VITALS — BP 139/86 | HR 70 | Resp 18

## 2016-07-15 DIAGNOSIS — G609 Hereditary and idiopathic neuropathy, unspecified: Secondary | ICD-10-CM | POA: Diagnosis not present

## 2016-07-15 DIAGNOSIS — B351 Tinea unguium: Secondary | ICD-10-CM | POA: Diagnosis not present

## 2016-07-15 DIAGNOSIS — M79674 Pain in right toe(s): Secondary | ICD-10-CM

## 2016-07-15 DIAGNOSIS — M79675 Pain in left toe(s): Secondary | ICD-10-CM | POA: Diagnosis not present

## 2016-07-15 NOTE — Progress Notes (Signed)
   Subjective:    Patient ID: Jacob [name], male    DOB: 01/01/1946, 71 y.o.   MRN: [id number]  HPI Patient presents today complaining that his toenails are thick and long and uncomfortable walking wearing shoes and he is not able to trim the toenails himself. He says that the nails have become progressively thicker and longer especially the last several months when he was hospitalized. Patient denies any previous professional care.  Patient denies history of diabetes Patient is a former smoker   Review of Systems  All other systems reviewed and are negative.      Objective:   Physical Exam  Orientated 3  Vascular: DP and PT pulses 2/4 bilaterally Capillary reflex immediate bilaterally  Neurological: Sensation to 10 g monofilament wire intact 2/5 right 3/5 left Vibratory sensation nonreactive bilaterally Ankle reflexes reactive bilaterally  Dermatological: No open skin lesions bilaterally Dry, atrophic, skin with absent hair growth bilaterally The toenails are elongated, hypertrophic, discolored, deformed and tender direct palpation 6-10  Musculoskeletal: Pes planus and HAV bilaterally Manual motor testing dorsi flexion, plantar flexion 5/5 bilaterally       Assessment & Plan:   Assessment: Idiopathic peripheral sensory neuropathy Neglected symptomatic mycotic toenails 6-10  Plan: Debridement toenails 6-10 mechanically and electronically without any bleeding  Reappoint 3 months

## 2016-07-15 NOTE — Telephone Encounter (Signed)
Spoke with patient, last scan was normal.

## 2016-07-15 NOTE — Telephone Encounter (Signed)
-----   Message from Wyatt Portela, MD sent at 07/15/2016  8:41 AM EDT ----- Please let him know his scan is normal now.

## 2016-09-02 ENCOUNTER — Other Ambulatory Visit: Payer: Medicare Other

## 2016-09-02 ENCOUNTER — Ambulatory Visit: Payer: Medicare Other | Admitting: Oncology

## 2016-09-16 ENCOUNTER — Ambulatory Visit (HOSPITAL_BASED_OUTPATIENT_CLINIC_OR_DEPARTMENT_OTHER): Payer: Medicare Other | Admitting: Oncology

## 2016-09-16 ENCOUNTER — Telehealth: Payer: Self-pay | Admitting: Oncology

## 2016-09-16 VITALS — BP 148/81 | HR 78 | Temp 98.7°F | Resp 18 | Ht 74.0 in | Wt 234.5 lb

## 2016-09-16 DIAGNOSIS — Z8546 Personal history of malignant neoplasm of prostate: Secondary | ICD-10-CM

## 2016-09-16 DIAGNOSIS — D649 Anemia, unspecified: Secondary | ICD-10-CM

## 2016-09-16 DIAGNOSIS — C163 Malignant neoplasm of pyloric antrum: Secondary | ICD-10-CM

## 2016-09-16 DIAGNOSIS — N289 Disorder of kidney and ureter, unspecified: Secondary | ICD-10-CM | POA: Diagnosis not present

## 2016-09-16 DIAGNOSIS — C169 Malignant neoplasm of stomach, unspecified: Secondary | ICD-10-CM

## 2016-09-16 NOTE — Telephone Encounter (Signed)
Gave patient AVS and calendar of upcoming January appointments °

## 2016-09-16 NOTE — Progress Notes (Signed)
Hematology and Oncology Follow Up Visit  Jacob [name] [id number] 1945-07-31 71 y.o. 09/16/2016 3:48 PM [name], Jacob Em, MD   Principle Diagnosis:  71 year old gentleman with:  1.Normocytic, normochromic anemia diagnosed March 2017. His anemia is due to renal insufficiency with creatinine of 2.9 creatinine clearance 44 mL/m.  2. Prostate cancer in 2013 with a Gleason score 3+4 = 7 and a PSA 5.66 stage TIc.  3. 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 His staging by EUS showed T3 N2 disease without any evidence of metastatic disease.   Prior Therapy:  He is status post radiation therapy for definitive treatment for his prostate cancer. Therapy concluded in December 2013. He is status post packed red cell transfusions in April 2017. He is status post bone marrow biopsy in April 2017. Results did not show any plasma cell disorder or myelodysplasia. He is status post IV iron infusion completed in April 2017. This was repeated in September 2017. FOLFOX chemotherapy with cycle 1 to be given on 12/12/2015.  He is S/P cycle 5 of therapy given on 02/20/2016. He is status post distal gastrectomy and Billroth II reconstruction completed on 03/25/2016. The final pathology revealed a T3 N0 residual tumor without any lymphadenopathy involved.  Current therapy: Postoperative care and observation and surveillance.   Interim History:  Jacob [name] presents today for a follow-up visit. Since the last visit, he reports still recent complaints. He has resumed most activities of daily living including short distance driving. His health continues to improve since his operation. His stamina still limited but able to perform certain activities of daily living. He denied any abdominal pain, hematochezia or melena.  He does not report any headaches, blurry vision, syncope or seizures. He does not report any fevers, chills, sweats or weight loss. Does not report  any chest pain, palpitation, orthopnea or leg edema. He does not report any cough, wheezing or hemoptysis. He does not report any frequency, urgency or hesitancy. He does not report any skeletal complaints of arthralgias or myalgias. Remaining review of systems unremarkable.   Medications: I have reviewed the patient's current medications.  Current Outpatient Prescriptions  Medication Sig Dispense Refill  . acetaminophen (TYLENOL) 500 MG tablet Take 1,000 mg by mouth daily as needed for moderate pain or headache.    . allopurinol (ZYLOPRIM) 300 MG tablet Take 300 mg by mouth daily.     Marland Kitchen buPROPion (WELLBUTRIN XL) 300 MG 24 hr tablet Take 300 mg by mouth daily.     . calcium carbonate (TUMS - DOSED IN MG ELEMENTAL CALCIUM) 500 MG chewable tablet Chew 3 tablets by mouth 2 (two) times daily as needed for indigestion or heartburn.    . diazepam (VALIUM) 5 MG tablet Take 5 mg by mouth at bedtime as needed (sleep).     . furosemide (LASIX) 40 MG tablet Take 40 mg by mouth every other day.    . furosemide (LASIX) 80 MG tablet     . gabapentin (NEURONTIN) 800 MG tablet Take 800 mg by mouth at bedtime.     . lidocaine-prilocaine (EMLA) cream Apply 1 application topically as needed. Apply to port before chemotherapy. 30 g 0  . Melatonin 10 MG TABS Take 1 tablet by mouth at bedtime as needed (sleep).     . metoCLOPramide (REGLAN) 5 MG/5ML solution Take 10 mLs (10 mg total) by mouth 4 (four) times daily -  before meals and at bedtime. 120 mL 0  . Nebivolol HCl (BYSTOLIC) 20 MG  TABS Take 20 mg by mouth daily.     . Nutritional Supplements (ENSURE ENLIVE PO) Take 237 mLs by mouth 2 (two) times daily.     . ondansetron (ZOFRAN-ODT) 4 MG disintegrating tablet Take 1 tablet (4 mg total) by mouth every 6 (six) hours as needed for nausea. 20 tablet 3  . OXYGEN Inhale 2 L into the lungs at bedtime as needed (for shortness of breath).    . pantoprazole (PROTONIX) 40 MG tablet Take 1 tablet (40 mg total) by mouth  daily. 30 tablet 3  . polycarbophil (FIBERCON) 625 MG tablet Take 1 tablet (625 mg total) by mouth daily. 30 tablet 3  . prochlorperazine (COMPAZINE) 10 MG tablet Take 1 tablet (10 mg total) by mouth every 6 (six) hours as needed for nausea or vomiting. 30 tablet 0  . tamsulosin (FLOMAX) 0.4 MG CAPS capsule TAKE 1 CAPSULE BY MOUTH DAILY 30 capsule 2  . traZODone (DESYREL) 50 MG tablet Take 50 mg by mouth at bedtime.     . Verapamil HCl CR 300 MG CP24 Take 300 mg by mouth at bedtime.     . Water For Irrigation, Sterile (FREE WATER) SOLN Place 100 mLs into feeding tube every 4 (four) hours. 1000 mL 0  . oxyCODONE (OXY IR/ROXICODONE) 5 MG immediate release tablet Take 1-2 tablets (5-10 mg total) by mouth every 6 (six) hours as needed for moderate pain, severe pain or breakthrough pain. (Patient not taking: Reported on 09/16/2016) 15 tablet 0   No current facility-administered medications for this visit.      Allergies:  No Known Allergies  Past Medical History, Surgical history, Social history, and Family History were reviewed and updated.   Physical Exam: Blood pressure (!) 148/81, pulse 78, temperature 98.7 F (37.1 C), temperature source Oral, resp. rate 18, height 6' 2"  (1.88 m), weight 234 lb 8 oz (106.4 kg), SpO2 100 %. ECOG: 1 General appearance: A comfortable-appearing gentleman without distress. Head: Normocephalic, without obvious abnormality no oral ulcers or thrush. Neck: no adenopathy Lymph nodes: Cervical, supraclavicular, and axillary nodes normal. Heart:regular rate and rhythm, S1, S2 normal, no murmur, click, rub or gallop Lung:chest clear, no wheezing, rales, normal symmetric air entry Abdomin: soft, non-tender, without masses or organomegaly no shifting dullness or ascites. EXT:no erythema, induration, or nodules   Lab Results: Lab Results  Component Value Date   WBC 7.6 07/09/2016   HGB 9.2 (L) 07/09/2016   HCT 28.6 (L) 07/09/2016   MCV 95.7 07/09/2016   PLT 176  07/09/2016     Chemistry      Component Value Date/Time   NA 137 07/09/2016 1013   K 4.7 07/09/2016 1013   CL 111 04/20/2016 0422   CO2 26 07/09/2016 1013   BUN 28.7 (H) 07/09/2016 1013   CREATININE 2.1 (H) 07/09/2016 1013      Component Value Date/Time   CALCIUM 10.8 (H) 07/09/2016 1013   ALKPHOS 44 07/09/2016 1013   AST 25 07/09/2016 1013   ALT 26 07/09/2016 1013   BILITOT 0.46 07/09/2016 1013     EXAM: NUCLEAR MEDICINE PET SKULL BASE TO THIGH  TECHNIQUE: 11.2 mCi F-18 FDG was injected intravenously. Full-ring PET imaging was performed from the skull base to thigh after the radiotracer. CT data was obtained and used for attenuation correction and anatomic localization.  FASTING BLOOD GLUCOSE:  Value: 8 2 mg/dl  COMPARISON:  CT abdomen pelvis 04/08/2016 and PET 02/07/2016.  FINDINGS: NECK  No hypermetabolic lymph nodes in the  neck. Retention cyst or polyp in the left maxillary sinus.  CHEST  No hypermetabolic mediastinal, hilar or axillary lymph nodes. No hypermetabolic pulmonary nodules. A 1.4 cm nodule in the anterior left chest wall is stable in size but is no longer hypermetabolic, with an SUV max of 1.4, compared to 2.8 previously. Left subclavian Port-A-Cath terminates in the SVC. Coronary artery calcification. No pericardial or pleural effusion. Mild patchy peribronchovascular ground-glass in the right upper lobe is new and likely infectious or inflammatory in etiology.  ABDOMEN/PELVIS  There is hypermetabolism within the proximal stomach, which may be physiologic. Partial gastrectomy. Peripheral hypermetabolism is seen around a large area of circumscribed omental fat and haziness, deep to a midline incisional scar measuring roughly 7.3 x 13.9 cm, decreased slightly from 8.0 x 15.7 cm on 04/08/2016. No abnormal hypermetabolism in the liver, adrenal glands, spleen or pancreas. No hypermetabolic lymph nodes. Liver, gallbladder and adrenal  glands are unremarkable. Low-attenuation lesions in the kidneys measure up to 5.9 cm on the right. At least 1 has peripheral calcification on the right. Lesions are difficult to further characterize due to size and/or lack of post-contrast imaging. Spleen, pancreas, stomach and bowel are grossly unremarkable. Atherosclerotic calcification of the arterial vasculature. Left inguinal hernia contains fat.  SKELETON  No abnormal osseous hypermetabolism. Degenerative changes in the spine.  IMPRESSION: 1. Partial gastrectomy. Proximal gastric hypermetabolism may be physiologic. No evidence of metastatic disease. 2. Soft tissue nodule in the anterior left chest wall is no longer hypermetabolic. 3. 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 Lesion has decreased minimally in size from 04/08/2016. 4. Aortic atherosclerosis (ICD10-170.0). Coronary artery calcification. 5. Mild patchy peribronchovascular ground-glass in the right upper lobe is new and likely infectious or inflammatory in etiology.   Impression and Plan:  71 year old woman with the following issues:  1. Normocytic, normochromic anemia presented initially with a hemoglobin of 9.2. These findings are multifactorial related to anemia of blood loss related to his tumor, malignancy and renal insufficiency. His hemoglobin is stable at this time and does not require packed red cell transfusion. We will continue to check periodically.  2. Antral mass that is biopsy proven to be adenocarcinoma. He is status post EUS and PET CT scan which confirmed a staging of about T3 N2 disease. No evidence of distant metastasis noted.  He is S/P 5 cycles of FOLFOX and tolerated well.  PET CT scan obtained on 02/07/2016 which showed excellent response to therapy but clearly with residual tumor.   He is status post distal gastrectomy which completed  in 03/25/2016. He still has a large tumor without any lymph node involvement. The final pathology showed T3 N0.   PET CT scan on 07/14/2016 showed no evidence of recurrent disease. The plan is to continue active surveillance and repeat imaging studies in January 2019.   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 His last protein studies obtained in September 2017 showed no major changes and will be repeated annually.  4. Renal insufficiency: His creatinine post surgery have improved to baseline.   5. IV access: Port-A-Cath remains in place at the time being. It will be flushed every 8 weeks.  6. Follow-up: In 4 months to follow his progress.   Zola Button, MD 9/4/20183:48 PM

## 2016-10-14 ENCOUNTER — Encounter: Payer: Self-pay | Admitting: Podiatry

## 2016-10-14 ENCOUNTER — Ambulatory Visit (INDEPENDENT_AMBULATORY_CARE_PROVIDER_SITE_OTHER): Payer: Medicare Other | Admitting: Podiatry

## 2016-10-14 DIAGNOSIS — G609 Hereditary and idiopathic neuropathy, unspecified: Secondary | ICD-10-CM

## 2016-10-14 DIAGNOSIS — B351 Tinea unguium: Secondary | ICD-10-CM | POA: Diagnosis not present

## 2016-10-14 DIAGNOSIS — M79675 Pain in left toe(s): Secondary | ICD-10-CM | POA: Diagnosis not present

## 2016-10-14 DIAGNOSIS — M79674 Pain in right toe(s): Secondary | ICD-10-CM | POA: Diagnosis not present

## 2016-10-14 NOTE — Progress Notes (Signed)
Patient ID: Suraj Ramdass, male   DOB: [birth year], 71 y.o.   MRN: [id number]   Subjective: Patient presents today complaining that his toenails are thick and long and uncomfortable walking wearing shoes and he is not able to trim the toenails himself and request toenail debridement   Orientated 3  Vascular: DP and PT pulses 2/4 bilaterally Capillary reflex immediate bilaterally  Neurological: Sensation to 10 g monofilament wire intact 2/5 right 3/5 left Vibratory sensation nonreactive bilaterally Ankle reflexes reactive bilaterally  Dermatological: No open skin lesions bilaterally Dry, atrophic, skin with absent hair growth bilaterally The toenails are elongated, hypertrophic, discolored, deformed and tender direct palpation 6-10  Musculoskeletal: Pes planus and HAV bilaterally Manual motor testing dorsi flexion, plantar flexion 5/5 bilaterally   Assessment: Idiopathic peripheral sensory neuropathy Neglected symptomatic mycotic toenails 6-10  Plan: Debridement toenails 6-10 mechanically and electronically without any bleeding  Reappoint 3 months

## 2017-01-16 ENCOUNTER — Other Ambulatory Visit (HOSPITAL_BASED_OUTPATIENT_CLINIC_OR_DEPARTMENT_OTHER): Payer: Medicare Other

## 2017-01-16 DIAGNOSIS — C169 Malignant neoplasm of stomach, unspecified: Secondary | ICD-10-CM

## 2017-01-16 LAB — CBC WITH DIFFERENTIAL/PLATELET
BASO%: 0.3 % (ref 0.0–2.0)
Basophils Absolute: 0 10*3/uL (ref 0.0–0.1)
EOS%: 7.7 % — ABNORMAL HIGH (ref 0.0–7.0)
Eosinophils Absolute: 0.5 10*3/uL (ref 0.0–0.5)
HEMATOCRIT: 32.6 % — AB (ref 38.4–49.9)
HEMOGLOBIN: 10.4 g/dL — AB (ref 13.0–17.1)
LYMPH#: 1.1 10*3/uL (ref 0.9–3.3)
LYMPH%: 17.7 % (ref 14.0–49.0)
MCH: 32.5 pg (ref 27.2–33.4)
MCHC: 31.9 g/dL — AB (ref 32.0–36.0)
MCV: 101.9 fL — ABNORMAL HIGH (ref 79.3–98.0)
MONO#: 0.7 10*3/uL (ref 0.1–0.9)
MONO%: 10.8 % (ref 0.0–14.0)
NEUT%: 63.5 % (ref 39.0–75.0)
NEUTROS ABS: 4 10*3/uL (ref 1.5–6.5)
PLATELETS: 191 10*3/uL (ref 140–400)
RBC: 3.2 10*6/uL — ABNORMAL LOW (ref 4.20–5.82)
RDW: 15.1 % — AB (ref 11.0–14.6)
WBC: 6.2 10*3/uL (ref 4.0–10.3)

## 2017-01-16 LAB — COMPREHENSIVE METABOLIC PANEL
ALBUMIN: 3.2 g/dL — AB (ref 3.5–5.0)
ALK PHOS: 54 U/L (ref 40–150)
ALT: 10 U/L (ref 0–55)
AST: 16 U/L (ref 5–34)
Anion Gap: 6 mEq/L (ref 3–11)
BUN: 24 mg/dL (ref 7.0–26.0)
CO2: 25 mEq/L (ref 22–29)
Calcium: 9.8 mg/dL (ref 8.4–10.4)
Chloride: 110 mEq/L — ABNORMAL HIGH (ref 98–109)
Creatinine: 1.9 mg/dL — ABNORMAL HIGH (ref 0.7–1.3)
EGFR: 40 mL/min/{1.73_m2} — AB (ref >60)
GLUCOSE: 80 mg/dL (ref 70–140)
POTASSIUM: 4.8 meq/L (ref 3.5–5.1)
SODIUM: 141 meq/L (ref 136–145)
Total Bilirubin: 0.54 mg/dL (ref 0.20–1.20)
Total Protein: 7.1 g/dL (ref 6.4–8.3)

## 2017-01-20 ENCOUNTER — Inpatient Hospital Stay: Payer: Medicare Other | Attending: Oncology | Admitting: Oncology

## 2017-01-20 ENCOUNTER — Telehealth: Payer: Self-pay | Admitting: Oncology

## 2017-01-20 ENCOUNTER — Ambulatory Visit: Payer: Medicare Other

## 2017-01-20 VITALS — BP 162/90 | HR 76 | Temp 99.0°F | Resp 18 | Ht 74.0 in | Wt 253.5 lb

## 2017-01-20 DIAGNOSIS — I10 Essential (primary) hypertension: Secondary | ICD-10-CM | POA: Diagnosis not present

## 2017-01-20 DIAGNOSIS — N289 Disorder of kidney and ureter, unspecified: Secondary | ICD-10-CM | POA: Diagnosis not present

## 2017-01-20 DIAGNOSIS — Z923 Personal history of irradiation: Secondary | ICD-10-CM

## 2017-01-20 DIAGNOSIS — Z85028 Personal history of other malignant neoplasm of stomach: Secondary | ICD-10-CM | POA: Diagnosis not present

## 2017-01-20 DIAGNOSIS — Z79899 Other long term (current) drug therapy: Secondary | ICD-10-CM | POA: Insufficient documentation

## 2017-01-20 DIAGNOSIS — C169 Malignant neoplasm of stomach, unspecified: Secondary | ICD-10-CM

## 2017-01-20 DIAGNOSIS — D649 Anemia, unspecified: Secondary | ICD-10-CM | POA: Insufficient documentation

## 2017-01-20 DIAGNOSIS — Z8546 Personal history of malignant neoplasm of prostate: Secondary | ICD-10-CM | POA: Insufficient documentation

## 2017-01-20 DIAGNOSIS — Z903 Acquired absence of stomach [part of]: Secondary | ICD-10-CM | POA: Insufficient documentation

## 2017-01-20 NOTE — Progress Notes (Addendum)
Hematology and Oncology Follow Up Visit  Jacob [name] [id number] 1945-04-18 73 y.o. 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 His anemia is due to renal insufficiency with creatinine of 2.9 creatinine clearance 44 mL/m.  As well as malignancy and anemia of blood loss.  2. Prostate cancer in 2013 with a Gleason score 3+4 = 7 and a PSA 5.66 stage TIc.  His disease status has been stable since the time of diagnosis.  3. 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 His staging by EUS showed T3 N2 disease without any evidence of metastatic disease.  Continues to be without any evidence of disease.   Prior Therapy:  He is status post radiation therapy for definitive treatment for his prostate cancer. Therapy concluded in December 2013. He is status post packed red cell transfusions in April 2017. He is status post bone marrow biopsy in April 2017. Results did not show any plasma cell disorder or myelodysplasia. He is status post IV iron infusion completed in April 2017. This was repeated in September 2017. FOLFOX chemotherapy with cycle 1 to be given on 12/12/2015.  He is S/P cycle 5 of therapy given on 02/20/2016. He is status post distal gastrectomy and Billroth II reconstruction completed on 03/25/2016. The final pathology revealed a T3 N0 residual tumor without any lymphadenopathy involved.  Current therapy: Observation and surveillance.   Interim History:  Jacob [name] here for a follow-up visit by himself.  He continues to report significant improvement in his overall health and continues to report recovery from his surgery.  His appetite is excellent and have gained most of his weight back.  He is able to attend to activities of daily living including driving himself.  He does report issues with blood pressure being elevated periodically.  He denies any  abdominal pain, dysphagia or hematochezia.  He denies any excessive fatigue or tiredness.  He denies any pathological fractures or bone pain.  He is no longer receiving nutrition via feeding tube.  This has been removed permanently.  His urine output has been excellent without any dysuria or hematuria.  He does not report any headaches, blurry vision, syncope or seizures. He does not report any fevers, chills, sweats or weight loss. Does not report any chest pain, palpitation, orthopnea or leg edema. He does not report any cough, wheezing or hemoptysis. He does not report any frequency, urgency or hesitancy. He does not report any skeletal complaints of arthralgias or myalgias.  He denies any depression or anxiety.  Remaining review of systems is negative.  Medications: I have reviewed the patient's current medications.  Current Outpatient Medications  Medication Sig Dispense Refill  . acetaminophen (TYLENOL) 500 MG tablet Take 1,000 mg by mouth daily as needed for moderate pain or headache.    . allopurinol (ZYLOPRIM) 300 MG tablet Take 300 mg by mouth daily.     Marland Kitchen buPROPion (WELLBUTRIN XL) 300 MG 24 hr tablet Take 300 mg by mouth daily.     . calcium carbonate (TUMS - DOSED IN MG ELEMENTAL CALCIUM) 500 MG chewable tablet Chew 3 tablets by mouth 2 (two) times daily as needed for indigestion or heartburn.    . diazepam (VALIUM) 5 MG tablet Take 5 mg by mouth at bedtime as needed (sleep).     . Empagliflozin (JARDIANCE PO) Take by mouth daily.    . furosemide (LASIX) 40 MG tablet Take 40 mg by  mouth every other day.    . furosemide (LASIX) 80 MG tablet     . gabapentin (NEURONTIN) 800 MG tablet Take 800 mg by mouth at bedtime.     . lidocaine-prilocaine (EMLA) cream Apply 1 application topically as needed. Apply to port before chemotherapy. 30 g 0  . Melatonin 10 MG TABS Take 1 tablet by mouth at bedtime as needed (sleep).     . metoCLOPramide (REGLAN) 5 MG/5ML solution Take 10 mLs (10 mg total)  by mouth 4 (four) times daily -  before meals and at bedtime. 120 mL 0  . Nebivolol HCl (BYSTOLIC) 20 MG TABS Take 20 mg by mouth daily.     . Nutritional Supplements (ENSURE ENLIVE PO) Take 237 mLs by mouth 2 (two) times daily.     . ondansetron (ZOFRAN-ODT) 4 MG disintegrating tablet Take 1 tablet (4 mg total) by mouth every 6 (six) hours as needed for nausea. 20 tablet 3  . oxyCODONE (OXY IR/ROXICODONE) 5 MG immediate release tablet Take 1-2 tablets (5-10 mg total) by mouth every 6 (six) hours as needed for moderate pain, severe pain or breakthrough pain. 15 tablet 0  . OXYGEN Inhale 2 L into the lungs at bedtime as needed (for shortness of breath).    . pantoprazole (PROTONIX) 40 MG tablet Take 1 tablet (40 mg total) by mouth daily. 30 tablet 3  . polycarbophil (FIBERCON) 625 MG tablet Take 1 tablet (625 mg total) by mouth daily. 30 tablet 3  . prochlorperazine (COMPAZINE) 10 MG tablet Take 1 tablet (10 mg total) by mouth every 6 (six) hours as needed for nausea or vomiting. 30 tablet 0  . tamsulosin (FLOMAX) 0.4 MG CAPS capsule TAKE 1 CAPSULE BY MOUTH DAILY 30 capsule 2  . traZODone (DESYREL) 50 MG tablet Take 50 mg by mouth at bedtime.     . Verapamil HCl CR 300 MG CP24 Take 300 mg by mouth at bedtime.     . Water For Irrigation, Sterile (FREE WATER) SOLN Place 100 mLs into feeding tube every 4 (four) hours. 1000 mL 0   No current facility-administered medications for this visit.      Allergies:  No Known Allergies  Past Medical History, Surgical history, Social history, and Family History were reviewed and updated today.  He denied any alcohol or tobacco use.   Physical Exam: Blood pressure (!) 162/90, pulse 76, temperature 99 F (37.2 C), temperature source Oral, resp. rate 18, height _0  (1.88 m), weight 253 lb 8 oz (115 kg), SpO2 99 %. ECOG: 1 General appearance: Well-appearing gentleman appeared comfortable. Eyes: Pupils are equal and react to light appropriately.  Sclera  anicteric. Oral mucosa: No oral ulcers, thrush or lesions. Lymph nodes: No lymphadenopathy palpated in the cervical, axillary or supraclavicular area. Heart: No murmurs or gallops.  Regular rate and rhythm. Lung: Clear to auscultation without rhonchi or wheezes. Abdomin: no rebound or guarding noted.  Soft nontender. Skeletal: Good range of motion in all extremities. Skin: No rashes or lesions. Psych: Mood appeared appropriate   Lab Results: Lab Results  Component Value Date   WBC 6.2 01/16/2017   HGB 10.4 (L) 01/16/2017   HCT 32.6 (L) 01/16/2017   MCV 101.9 (H) 01/16/2017   PLT 191 01/16/2017     Chemistry      Component Value Date/Time   NA 141 01/16/2017 0924   K 4.8 01/16/2017 0924   CL 111 04/20/2016 0422   CO2 25 01/16/2017 0924   BUN  24.0 01/16/2017 0924   CREATININE 1.9 (H) 01/16/2017 0924      Component Value Date/Time   CALCIUM 9.8 01/16/2017 0924   ALKPHOS 54 01/16/2017 0924   AST 16 01/16/2017 0924   ALT 10 01/16/2017 0924   BILITOT 0.54 01/16/2017 0924      IMPRESSION: 1. Partial gastrectomy. Proximal gastric hypermetabolism may be physiologic. No evidence of metastatic disease. 2. Soft tissue nodule in the anterior left chest wall is no longer hypermetabolic. 3. 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 Lesion has decreased minimally in size from 04/08/2016. 4. Aortic atherosclerosis (ICD10-170.0). Coronary artery calcification. 5. Mild patchy peribronchovascular ground-glass in the right upper lobe is new and likely infectious or inflammatory in etiology.   Impression and Plan:  72 year old woman with the following issues:  1.  Multifactorial anemia presented initially with a hemoglobin of 9.2.  His anemia is related to renal insufficiency as well as malignancy.   His hemoglobin on January 17, 2016 was reviewed today showed 10.4 with an MCV  101.9.  No intervention is needed at this time and the plan is to continue with surveillance.  Growth factor support in the form of Aranesp can be reintroduced in the future if needed 2.  2. Adenocarcinoma of the stomach. He is status post EUS and PET CT scan which confirmed a staging of about T3 N2 disease. No evidence of distant metastasis noted.  He is S/P 5 cycles of FOLFOX neoadjuvant only that are well tolerated.  Marland Kitchen  PET CT scan obtained on 02/07/2016 showed no evidence of disease.  He is status post distal gastrectomy which completed in 03/25/2016. He still has a large tumor without any lymph node involvement. The final pathology showed T3 N0.   PET CT scan in July 2018 was personally reviewed and showed no evidence of cancer.  This was discussed with the patient again on today's visit.  The plan is to continue with active surveillance and repeat imaging studies periodically to monitor for cancer recurrence.  He will be scheduled for CT scan in the immediate future.  He was supposed to have his scan done before today's visit but it was not completed.   3. 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  No evidence of multiple myeloma has been diagnosed on previous visits.  See no evidence to suggested on today's visit.  Plan is to continue to repeat serum protein electrophoresis and quantitative immunoglobulins annually.  This will be repeated with the next visit.  4. Renal insufficiency: Kidney function appears at baseline at this time.  5. IV access: Port-A-Cath presents no issues for him at this time.  Risks and benefits of removing it was reviewed and is agreeable to keep it.  This will be flushed periodically.  6.  Hypertension: Continues to follow with his primary care physician regarding this issue.  7. Follow-up: In 4 months to follow his progress.  This will be sooner if his CT scan showed any abnormalities.  15 minutes was spent on today's visit.  More than 50% spent  face-to-face providing counseling, education and planning treatment for his conditions.   Zola Button, MD 1/8/20199:13 AM

## 2017-01-20 NOTE — Telephone Encounter (Signed)
Gave avs and calendar for march and may

## 2017-01-20 NOTE — Progress Notes (Signed)
Nutrition Assessment  Patient with gastric adenocarcinoma of the antrum dx in October 2017, no recurrence at this time. Patient also with prostate cancer in 2013, stable since time of diagnosis  And multifactorial anemia due to renal insufficency.  Pt s/p radiation therapy for prostate cancer, chemotherapy stopped on 02/20/2016 and s/p distal gastrectomy and billroth II on 03/2016. Noted patient had feeding tube but has since been removed. Patient currently under observation and surveillance.  Past medical history of HTN as well.    Patient walked into clinic this am saying that he needed to see a dietitian as he couldn't make other appointment.  Noted patient was scheduled for appointment in June 2018.  Patient seen as add on.  Patient reports good appetite.  Only 2 episodes of nausea since having surgery and that has been when he went out to eat.  Reports 2 episodes of diarrhea as well.  Reports typically takes fiber supplement and has bowel movement about every 2 days then will have bowel movement for several days then not have one for couple days.  Reports typically gets up around 11am and has lunch at noon of sandwich or bacon and eggs, then nibbles until supper time.  Supper is usually hamburger helper, hamburger or hot dog, spaghetti, grilled cheese.  Reports likes to eat sweets.  Has stopped drinking supplements (ensure/boost), "I don't think I need them."    Nutrition Focused Physical Exam: deferred  Medications: reviewed  Labs: creatinine 1.9, hgb 10.4  Anthropometrics:   Height: 74 inches Weight: 253 lb 8 oz (increased) Noted on 9/4 234 lb. Noted wt in June 226 lb BMI: 32  Weight increase over the past few months   INTERVENTION:   Encouraged well balanced diet (including good sources of protein) Encouraged small frequent meals Encouraged less fried foods and sweets.   Agree at this time patient does not need oral nutrition supplement. Contact information provided    MONITORING,  EVALUATION, GOAL: patient meeting nutritional needs with weight gain and not having nutrition impact symptoms   NEXT VISIT: no further follow-up planned  Levetta Bognar B. Zenia Resides, [place], [place] Registered Dietitian [phone] (pager)

## 2017-01-26 ENCOUNTER — Ambulatory Visit: Payer: Medicare Other | Admitting: Podiatry

## 2017-01-27 ENCOUNTER — Ambulatory Visit (HOSPITAL_COMMUNITY)
Admission: RE | Admit: 2017-01-27 | Discharge: 2017-01-27 | Disposition: A | Discharge status: Home / Self Care | Payer: Medicare Other | Source: Ambulatory Visit | Attending: Oncology | Admitting: Oncology

## 2017-01-27 ENCOUNTER — Encounter (HOSPITAL_COMMUNITY): Payer: Self-pay

## 2017-01-27 DIAGNOSIS — I2510 Atherosclerotic heart disease of native coronary artery without angina pectoris: Secondary | ICD-10-CM | POA: Insufficient documentation

## 2017-01-27 DIAGNOSIS — I700 Atherosclerosis of aorta: Secondary | ICD-10-CM | POA: Diagnosis not present

## 2017-01-27 DIAGNOSIS — C169 Malignant neoplasm of stomach, unspecified: Secondary | ICD-10-CM | POA: Insufficient documentation

## 2017-01-27 IMAGING — CT CT ABD-PELV W/O CM
2 of 4 series · 13 of 36 positions shown, 16 images · non-contrast
Comparison: [DATE]

CLINICAL DATA: Gastric adenocarcinoma.

EXAM:
CT CHEST, ABDOMEN AND PELVIS WITHOUT CONTRAST
TECHNIQUE: Multidetector CT imaging of the chest, abdomen and pelvis was
performed following the standard protocol without IV contrast.

[Series 2: cap w/o · axial · non-contrast · 0.93mm/px · z∈[-662,-107]mm · 10 of 135 slices shown, 13 images]
[im 12/135  mediastinal]
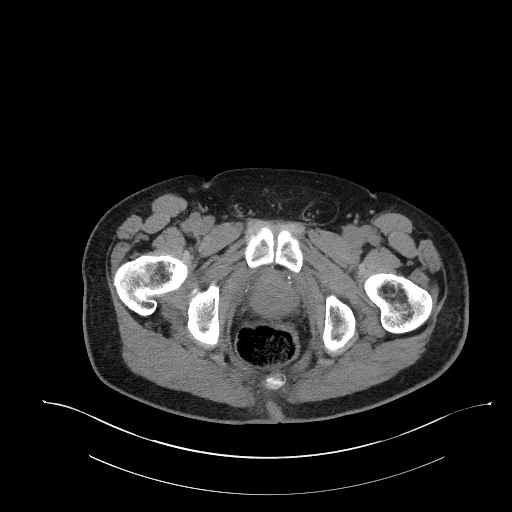
[im 12/135  lung]
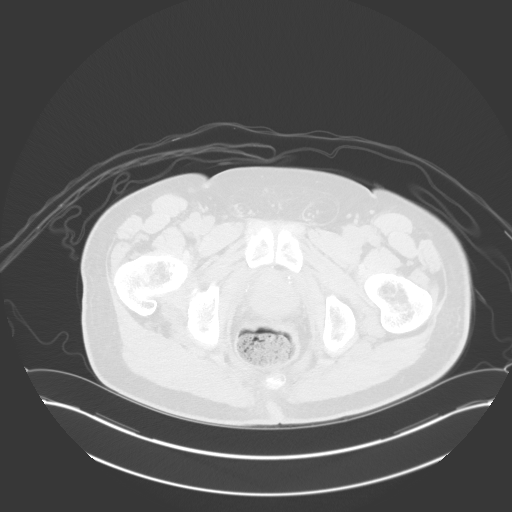
[im 23/135  lung]
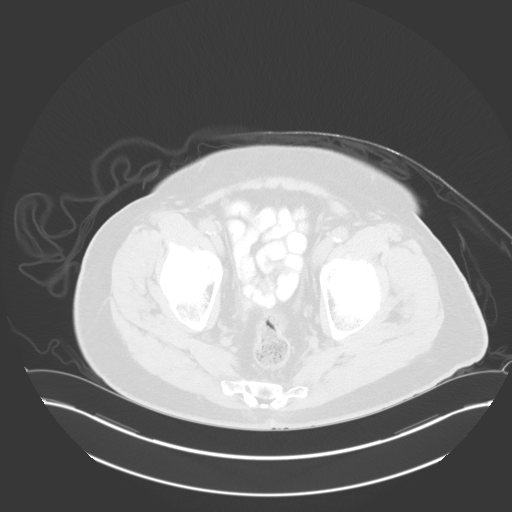
[im 34/135  lung]
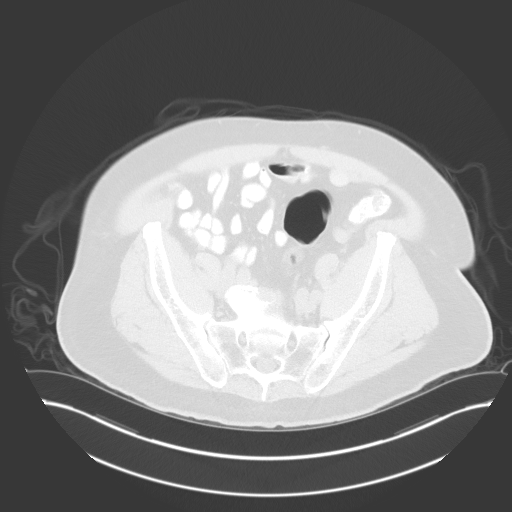
[im 45/135  lung]
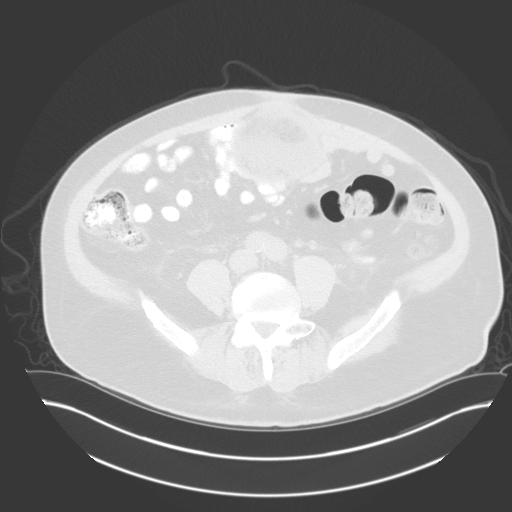
[im 56/135  mediastinal]
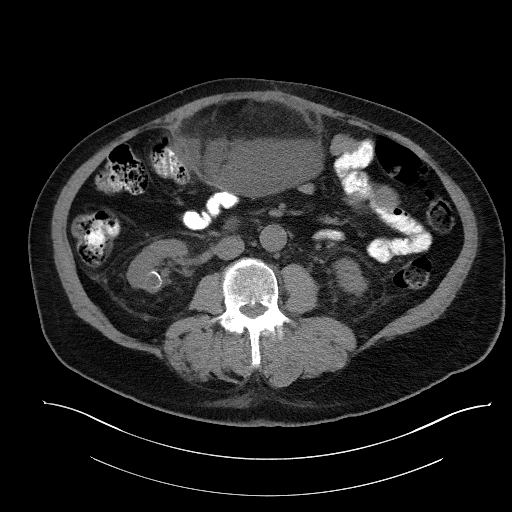
[im 56/135  lung]
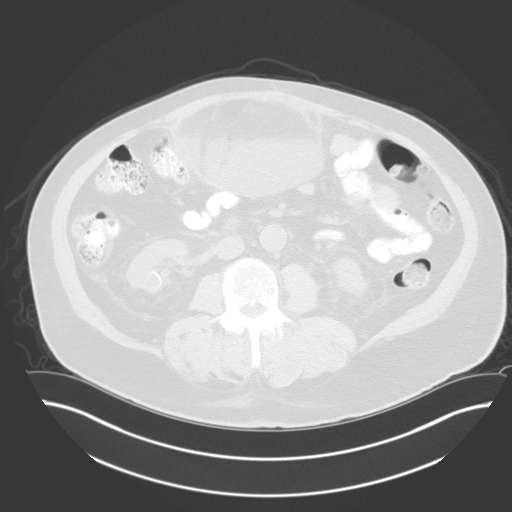
[im 79/135  lung]
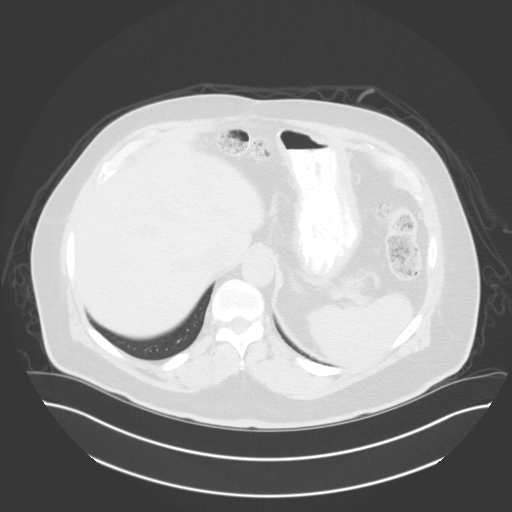
[im 90/135  lung]
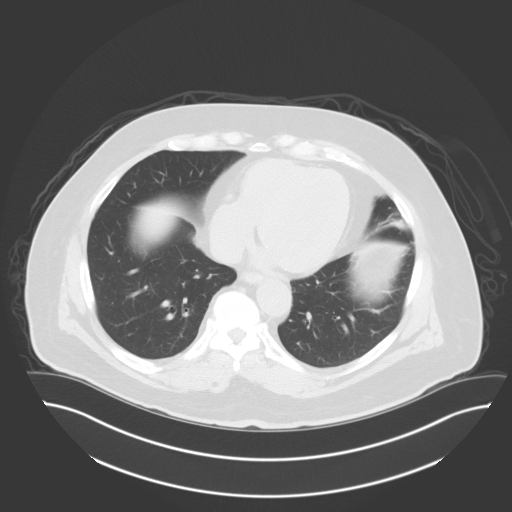
[im 101/135  lung]
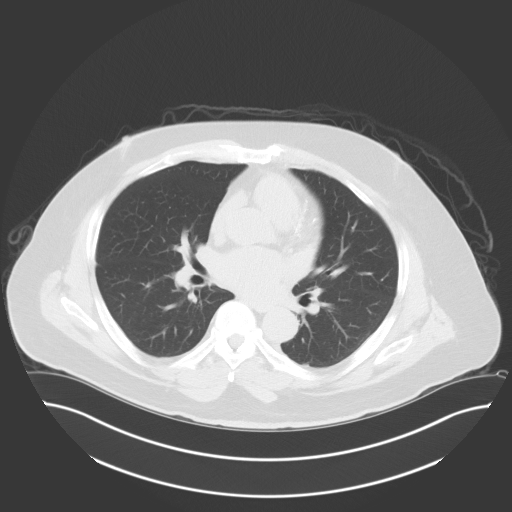
[im 112/135  mediastinal]
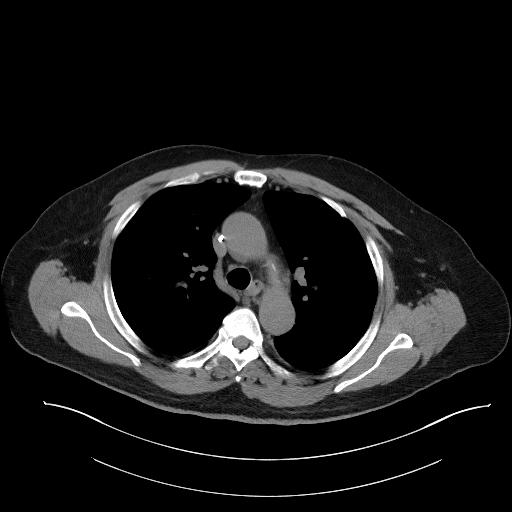
[im 112/135  lung]
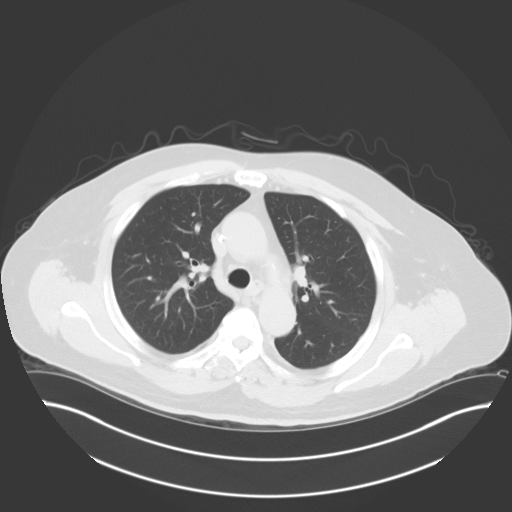
[im 123/135  lung]
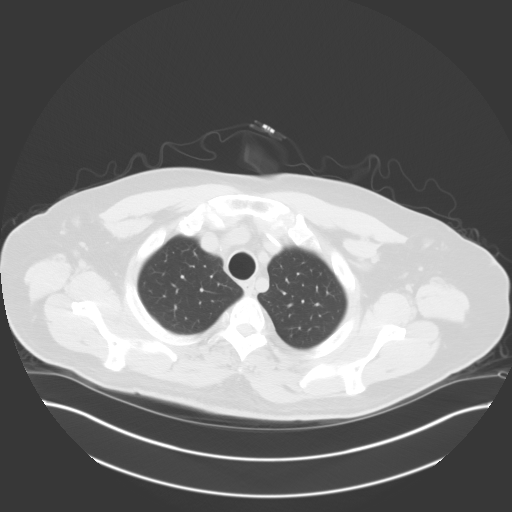

[Series 4: coronals · coronal · 0.94mm/px · 3 of 161 slices shown]
[im 33/161  lung]
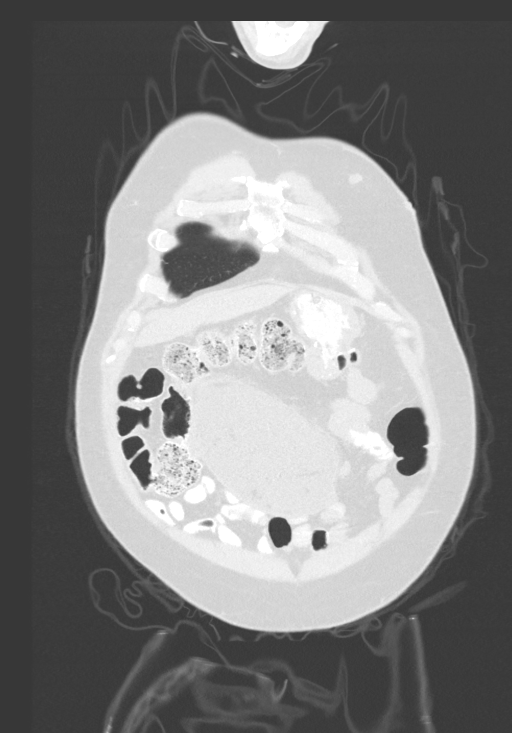
[im 65/161  lung]
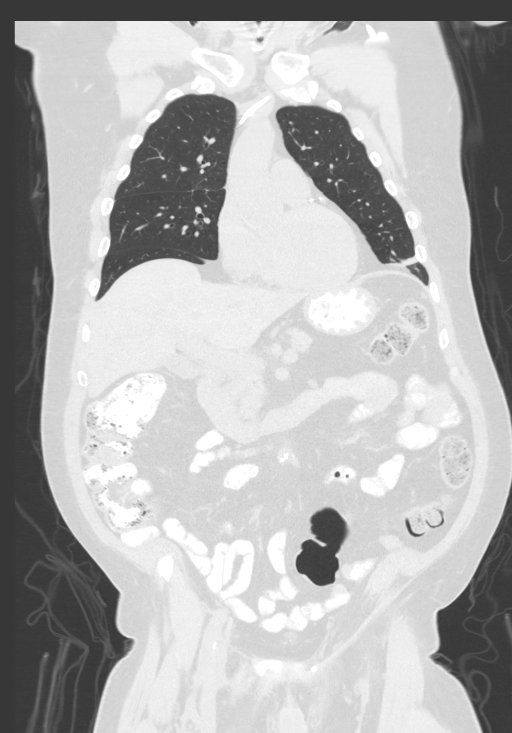
[im 97/161  lung]
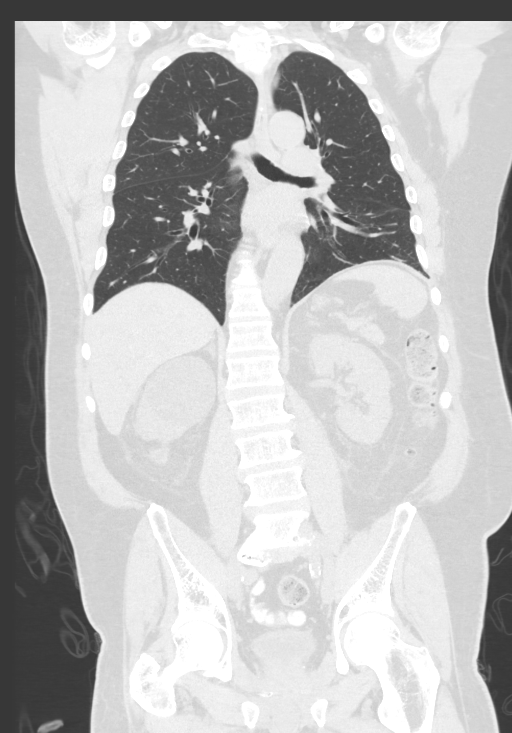

[13 of 36 positions shown; findings below may reference images not displayed]

FINDINGS: CT CHEST FINDINGS

Cardiovascular: The normal heart size. Aortic atherosclerosis.
Calcification in the LAD coronary artery noted. No pericardial
effusion.

Mediastinum/Nodes: No enlarged mediastinal, hilar, or axillary lymph
nodes. Thyroid gland, trachea, and esophagus demonstrate no
significant findings.

Lungs/Pleura: Lungs are clear. No pleural effusion or pneumothorax.
Small nodule within the posterolateral right upper lobe measures 4
mm and is stable from prior exam. Calcified granuloma identified
within the lingula.

Musculoskeletal: No chest wall mass or suspicious bone lesions
identified.

CT ABDOMEN PELVIS FINDINGS

Hepatobiliary: No focal liver abnormality is seen. No gallstones,
gallbladder wall thickening, or biliary dilatation.Small stones
identified within the dependent portion of the gallbladder. No
gallbladder wall thickening.

Pancreas: Unremarkable. No pancreatic ductal dilatation or
surrounding inflammatory changes.

Spleen: Normal in size without focal abnormality.

Adrenals/Urinary Tract: The adrenal glands are normal. Right kidney
cyst appears peripherally calcified and is incompletely
characterized without IV contrast measuring 1.4 cm. Large cyst
arising from upper pole of the right kidney measures 6.8 cm. Urinary
bladder is normal.

Stomach/Bowel: Postoperative changes from partial gastrectomy and
gastrojejunostomy identified. No gastric outlet obstruction. No
abnormal small or large bowel dilatation. The appendix is visualized
and appears normal.

Vascular/Lymphatic: Aortic atherosclerosis. Infrarenal abdominal
aortic ectasia measures 2.8 cm, image 83 of series 2. No adenopathy
within the abdomen or pelvis. No inguinal adenopathy.

Reproductive: Seed implants identified within the prostate gland.

Other: Probable large area of omental infarction is again identified
within the ventral abdomen deep to incisional scar. This measures
16.8 by 9.1 by 9.1 cm (volume = 730 cm^3). This appears more well
encapsulated compared with previous exam. No free fluid or fluid
collection

Musculoskeletal: Degenerative disc disease and mild scoliosis
identified within the thoracic and lumbar spine. Stable left ventral
chest wall soft tissue nodule measuring 1.2 cm, image 31 of series
2.
IMPRESSION: 1. Status post partial gastrectomy and gastrojejunostomy without
evidence for residual or recurrence of tumor. No evidence for
distant metastatic disease.
2. Continued evolutionary changes of omental infarct which appears
increasingly encapsulated compared with previous exam.
3. Aortic Atherosclerosis ([T7]-[T7]). LAD coronary artery
calcification.

## 2017-02-03 ENCOUNTER — Ambulatory Visit: Payer: Medicare Other | Admitting: Podiatry

## 2017-02-03 DIAGNOSIS — M79675 Pain in left toe(s): Secondary | ICD-10-CM

## 2017-02-03 DIAGNOSIS — M79674 Pain in right toe(s): Secondary | ICD-10-CM | POA: Diagnosis not present

## 2017-02-03 DIAGNOSIS — G609 Hereditary and idiopathic neuropathy, unspecified: Secondary | ICD-10-CM

## 2017-02-03 DIAGNOSIS — B351 Tinea unguium: Secondary | ICD-10-CM

## 2017-02-04 NOTE — Progress Notes (Signed)
Subjective: 72 y.o. returns the office today for painful, elongated, thickened toenails which he cannot trim himself. Denies any redness or drainage around the nails. Denies any acute changes since last appointment and no new complaints today. Denies any systemic complaints such as fevers, chills, nausea, vomiting.   PCP: Prince Solian, MD Objective: AAO 3, NAD DP/PT pulses palpable, CRT less than 3 seconds Protective sensation decreased with Simms Weinstein monofilament Nails hypertrophic, dystrophic, elongated, brittle, discolored 10. There is tenderness overlying the nails 1-5 bilaterally. There is no surrounding erythema or drainage along the nail sites. No open lesions or pre-ulcerative lesions are identified. No other areas of tenderness bilateral lower extremities. No overlying edema, erythema, increased warmth. No pain with calf compression, swelling, warmth, erythema.  Assessment: Patient presents with symptomatic onychomycosis  Plan: -Treatment options including alternatives, risks, complications were discussed -Nails sharply debrided 10 without complication/bleeding. -Discussed daily foot inspection. If there are any changes, to call the office immediately.  -Follow-up in 3 months or sooner if any problems are to arise. In the meantime, encouraged to call the office with any questions, concerns, changes symptoms.  Celesta Gentile, DPM

## 2017-02-17 ENCOUNTER — Encounter: Payer: Self-pay | Admitting: Gastroenterology

## 2017-03-20 ENCOUNTER — Inpatient Hospital Stay: Payer: Medicare Other | Attending: Oncology

## 2017-03-20 ENCOUNTER — Other Ambulatory Visit: Payer: Self-pay | Admitting: Family

## 2017-03-20 DIAGNOSIS — Z452 Encounter for adjustment and management of vascular access device: Secondary | ICD-10-CM | POA: Diagnosis present

## 2017-03-20 DIAGNOSIS — Z85028 Personal history of other malignant neoplasm of stomach: Secondary | ICD-10-CM | POA: Diagnosis present

## 2017-03-20 DIAGNOSIS — D631 Anemia in chronic kidney disease: Secondary | ICD-10-CM

## 2017-03-20 DIAGNOSIS — N189 Chronic kidney disease, unspecified: Secondary | ICD-10-CM

## 2017-03-20 MED ORDER — SODIUM CHLORIDE 0.9 % IJ SOLN
10.0000 mL | INTRAMUSCULAR | Status: DC | PRN
  Administered 2017-03-20: 10 mL
  Filled 2017-03-20: qty 10

## 2017-03-20 MED ORDER — HEPARIN SOD (PORK) LOCK FLUSH 100 UNIT/ML IV SOLN
500.0000 [IU] | Freq: Once | INTRAVENOUS | Status: AC | PRN
  Administered 2017-03-20: 500 [IU]
  Filled 2017-03-20: qty 5

## 2017-03-20 NOTE — Patient Instructions (Signed)

## 2017-04-14 ENCOUNTER — Encounter: Payer: Self-pay | Admitting: Gastroenterology

## 2017-05-05 ENCOUNTER — Ambulatory Visit: Payer: Medicare Other | Admitting: Podiatry

## 2017-05-15 ENCOUNTER — Telehealth: Payer: Self-pay | Admitting: *Deleted

## 2017-05-15 NOTE — Telephone Encounter (Signed)
Spoke with patient. Made OV with Dr.Stark May 7 at 2:30. Pt aware to bring list of meds, ID and insurance card.  PV cancelled, colonoscopy is on 06/16/17.

## 2017-05-15 NOTE — Telephone Encounter (Signed)
Please schedule office appt with me or APP to evaluate and decide about colon, egd.

## 2017-05-15 NOTE — Telephone Encounter (Signed)
Dr.Stark, this patient is for a recall colon with you on 06/16/17. Hx polyps. He had colon and EGD 2014. Then he had EGD 2017, DX with stomach CA. He has had Partial Gastrectomy 03/25/16. Please review [facility] [place] notes under media on 01/23/17. Is patient okay for his colon at this time? Does he also need EGD?  Please advise. Thank you,Hong Moring pv

## 2017-05-19 ENCOUNTER — Ambulatory Visit: Payer: Medicare Other | Admitting: Gastroenterology

## 2017-05-19 ENCOUNTER — Encounter: Payer: Self-pay | Admitting: Gastroenterology

## 2017-05-19 VITALS — BP 126/84 | HR 72 | Ht 75.0 in | Wt 240.1 lb

## 2017-05-19 DIAGNOSIS — R634 Abnormal weight loss: Secondary | ICD-10-CM

## 2017-05-19 DIAGNOSIS — R935 Abnormal findings on diagnostic imaging of other abdominal regions, including retroperitoneum: Secondary | ICD-10-CM | POA: Diagnosis not present

## 2017-05-19 DIAGNOSIS — R6881 Early satiety: Secondary | ICD-10-CM

## 2017-05-19 DIAGNOSIS — Z86010 Personal history of colonic polyps: Secondary | ICD-10-CM | POA: Diagnosis not present

## 2017-05-19 DIAGNOSIS — Z85028 Personal history of other malignant neoplasm of stomach: Secondary | ICD-10-CM | POA: Diagnosis not present

## 2017-05-19 MED ORDER — NA SULFATE-K SULFATE-MG SULF 17.5-3.13-1.6 GM/177ML PO SOLN: 1.0000 | Freq: Once | ORAL | 0 refills | Status: AC

## 2017-05-19 NOTE — Progress Notes (Signed)
History of Present Illness: This is a 72 year old male referred by Prince Solian, MD for the evaluation of personal history of gastric cancer in 2017, personal history of adenomatous colon polyps in 2008, weight loss, early satiety, abnormal CT of abdomen. 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  He relates early satiety, able to eat less at each meal and a steady weight loss since his gastric surgery.  Office notes from Dr. Barry Dienes and Dr. Alen Blew in January reviewed. Denies abdominal pain, constipation, diarrhea, change in stool caliber, melena, hematochezia, nausea, vomiting, dysphagia, reflux symptoms, chest pain.   Abd/pelvic CT 01/2017 1. Status post partial gastrectomy and gastrojejunostomy without evidence for residual or recurrence of tumor. No evidence for distant metastatic disease. 2. 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 3. Aortic Atherosclerosis (ICD10-I70.0). LAD coronary artery calcification.    No Known Allergies   Current Outpatient Medications on File Prior to Visit  Medication Sig Dispense Refill  . acetaminophen (TYLENOL) 500 MG tablet Take 1,000 mg by mouth daily as needed for moderate pain or headache.    . allopurinol (ZYLOPRIM) 300 MG tablet Take 300 mg by mouth daily.     Marland Kitchen buPROPion (WELLBUTRIN XL) 300 MG 24 hr tablet Take 300 mg by mouth daily.     . calcium carbonate (TUMS - DOSED IN MG ELEMENTAL CALCIUM) 500 MG chewable tablet Chew 3 tablets by mouth 2 (two) times daily as needed for indigestion or heartburn.    . diazepam (VALIUM) 5 MG tablet Take 5 mg by mouth at bedtime as needed (sleep).     . Empagliflozin (JARDIANCE PO) Take by mouth daily.    . furosemide (LASIX) 40 MG tablet Take 40 mg by mouth every other day.    . furosemide (LASIX) 80 MG tablet     . gabapentin (NEURONTIN) 800 MG tablet Take 800 mg by mouth at bedtime.     .  lidocaine-prilocaine (EMLA) cream Apply 1 application topically as needed. Apply to port before chemotherapy. 30 g 0  . Melatonin 10 MG TABS Take 1 tablet by mouth at bedtime as needed (sleep).     . metoCLOPramide (REGLAN) 5 MG/5ML solution Take 10 mLs (10 mg total) by mouth 4 (four) times daily -  before meals and at bedtime. 120 mL 0  . Nebivolol HCl (BYSTOLIC) 20 MG TABS Take 20 mg by mouth daily.     . Nutritional Supplements (ENSURE ENLIVE PO) Take 237 mLs by mouth 2 (two) times daily.     . ondansetron (ZOFRAN-ODT) 4 MG disintegrating tablet Take 1 tablet (4 mg total) by mouth every 6 (six) hours as needed for nausea. 20 tablet 3  . pantoprazole (PROTONIX) 40 MG tablet Take 1 tablet (40 mg total) by mouth daily. 30 tablet 3  . polycarbophil (FIBERCON) 625 MG tablet Take 1 tablet (625 mg total) by mouth daily. 30 tablet 3  . prochlorperazine (COMPAZINE) 10 MG tablet Take 1 tablet (10 mg total) by mouth every 6 (six) hours as needed for nausea or vomiting. 30 tablet 0  . tamsulosin (FLOMAX) 0.4 MG CAPS capsule TAKE 1 CAPSULE BY MOUTH DAILY 30 capsule 2  . traZODone (DESYREL) 50 MG tablet Take 50 mg by mouth at bedtime.     . Verapamil HCl CR 300 MG CP24 Take 300 mg by mouth at bedtime.     . Water For Irrigation, Sterile (FREE WATER) SOLN  Place 100 mLs into feeding tube every 4 (four) hours. 1000 mL 0   No current facility-administered medications on file prior to visit.    Past Medical History:  Diagnosis Date  . Anemia    hx iron deficiency  . Anxiety   . Arthritis    gout  . Back pain   . BPH with obstruction/lower urinary tract symptoms   . Chronic kidney disease    "they said I do"  . Constipation   . Depression   . ED (erectile dysfunction)   . Epigastric pain   . GERD (gastroesophageal reflux disease)   . Heartburn   . History of blood transfusion   . History of radiation therapy 11/03/11-12/29/11   prostate  . Hypercholesterolemia   . Hypertension   . Night sweats     . Post-operative nausea and vomiting 04/09/2016  . Prostate cancer ([place]) 08/14/11   Adenocarcinoma,gleason:3+3=6,& 3+4=7,PSA=5.66  . PUD (peptic ulcer disease)   . Sleep apnea    does not use Cpap but does use O2 @ 2l   . Stomach cancer ([place])   . Ulcer    peptic ulcer hx   Past Surgical History:  Procedure Laterality Date  . colon polyps bx  11/24/06   colon,transverse and rectosigmoid polyps:tubular adenomas and hyperplastic polyps,no high grade dysplasia or malignancy   . duodenal bx  11/24/06   benign  . ESOPHAGOGASTRODUODENOSCOPY N/A 10/27/2015   Procedure: ESOPHAGOGASTRODUODENOSCOPY (EGD);  Surgeon: [name], MD;  Location: [facility];  Service: Endoscopy;  Laterality: N/A;  . EUS N/A 11/08/2015   Procedure: UPPER ENDOSCOPIC ULTRASOUND (EUS) RADIAL;  Surgeon: [name], MD;  Location: [facility];  Service: Endoscopy;  Laterality: N/A;  . GASTRECTOMY N/A 03/25/2016   Procedure: DISTAL GASTRECTOMY;  Surgeon:  Klein, MD;  Location: [facility];  Service: General;  Laterality: N/A;  . gastric bx  11/24/06   chronic active gastritis,with metaplasia and focal changaes of xanthelasma  . GASTROSTOMY N/A 03/25/2016   Procedure: INSERTION OF FEEDING TUBE;  Surgeon:  Klein, MD;  Location: [facility];  Service: General;  Laterality: N/A;  . INSERTION PROSTATE RADIATION SEED  12-29-11  . IR GENERIC HISTORICAL  04/01/2016   IR GJ TUBE CHANGE 04/01/2016 Markus Daft, MD [facility]  . IR GENERIC HISTORICAL  04/05/2016   IR GJ TUBE CHANGE 04/05/2016 Markus Daft, MD [facility]  . LAPAROSCOPIC GASTRECTOMY  03/25/2016   Diagnostic laparoscopy, distal gastrectomy with Billroth 2 reconstruction and gastrojejunostomy tube  . LAPAROSCOPY N/A 03/25/2016   Procedure: DIAGNOSTIC LAPAROSCOPY;  Surgeon:  Klein, MD;  Location: [facility];  Service: General;  Laterality: N/A;  . PORTACATH PLACEMENT Left 12/04/2015   Procedure: INSERTION PORT-A-CATH;  Surgeon:  Klein, MD;  Location: [facility];   Service: General;  Laterality: Left;  . PROSTATE BIOPSY  08/14/11   Adenocarcinoma/volume=58.51cc,gleason=3+3=6 & 3+4=7  . TONSILLECTOMY     72 years old   Social History   Socioeconomic History  . Marital status: Married    Spouse name: [name]  . Number of children: 3  . Years of education: 87  . Highest education level: Not on file  Occupational History  . Occupation: retired    Fish farm manager: Public house manager  Social Needs  . Financial resource strain: Not on file  . Food insecurity:    Worry: Not on file    Inability: Not on file  . Transportation needs:    Medical: Not on file    Non-medical: Not on file  Tobacco Use  . Smoking status: Former Smoker    Packs/day: 1.50    Years: 30.00    Pack years: 45.00    Types: Cigarettes    Last attempt to quit: 04/13/2000    Years since quitting: 17.1  . Smokeless tobacco: Never Used  Substance and Sexual Activity  . Alcohol use: Yes    Alcohol/week: 0.6 oz    Types: 1 Cans of beer per week    Comment: 1-2 beers every other night  . Drug use: No  . Sexual activity: Not Currently  Lifestyle  . Physical activity:    Days per week: Not on file    Minutes per session: Not on file  . Stress: Not on file  Relationships  . Social connections:    Talks on phone: Not on file    Gets together: Not on file    Attends religious service: Not on file    Active member of club or organization: Not on file    Attends meetings of clubs or organizations: Not on file    Relationship status: Not on file  Other Topics Concern  . Not on file  Social History Narrative   Patient is married [name]) and lives at home with his wife and one child.   Patient has three children.   Patient is retired.   Patient has a high school education.   Patient is ambi-dextrous.   Patient drinks two cups of coffee about three times a week.    Family History  Problem Relation Age of Onset  . Cancer Mother        NOS  . Alcohol abuse Father   . Alcohol abuse  Brother 50  . Cancer Paternal Aunt        NOS  . Colon cancer Neg Hx       Review of Systems: Pertinent positive and negative review of systems were noted in the above HPI section. All other review of systems were otherwise negative.    Physical Exam: General: Well developed, well nourished, no acute distress Head: Normocephalic and atraumatic Eyes:  sclerae anicteric, EOMI Ears: Normal auditory acuity Mouth: No deformity or lesions Neck: Supple, no masses or thyromegaly Lungs: Clear throughout to auscultation Heart: Regular rate and rhythm; no murmurs, rubs or bruits Abdomen: Soft, non tender and non distended. Firm mass at incision ~ 15 cm x  8 cm. No hepatosplenomegaly or hernias noted. Normal Bowel sounds Rectal: Deferred to colonoscopy Musculoskeletal: Symmetrical with no gross deformities  Skin: No lesions on visible extremities Pulses:  Normal pulses noted Extremities: No clubbing, cyanosis, edema or deformities noted Neurological: Alert oriented x 4, grossly nonfocal Cervical Nodes:  No significant cervical adenopathy Inguinal Nodes: No significant inguinal adenopathy Psychological:  Alert and cooperative. Normal mood and affect  Assessment and Recommendations:  1. Personal history of gastric adenocarcinoma. T3, N2. S/P Bilroth II. Early satiety and weight loss likely post BII. R/O ulcer, recurrent carcinoma, other causes. Schedule EGD. The risks (including bleeding, perforation, infection, missed lesions, medication reactions and possible hospitalization or surgery if complications occur), benefits, and alternatives to endoscopy with possible biopsy and possible dilation were discussed with the patient and they consent to proceed.   2. Personal history of adenomatous colon polyps. Schedule colonoscopy. The risks (including bleeding, perforation, infection, missed lesions, medication reactions and possible hospitalization or surgery if complications occur), benefits, and  alternatives to colonoscopy with possible biopsy and possible polypectomy were discussed with the patient and they consent to  proceed.   3. 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     cc: Prince Solian, MD [address] [place], [place] [address]

## 2017-05-19 NOTE — Patient Instructions (Signed)
You have been scheduled for an endoscopy and colonoscopy. Please follow the written instructions given to you at your visit today. Please pick up your prep supplies at the pharmacy within the next 1-3 days. If you use inhalers (even only as needed), please bring them with you on the day of your procedure. Your physician has requested that you go to www.startemmi.com and enter the access code given to you at your visit today. This web site gives a general overview about your procedure. However, you should still follow specific instructions given to you by our office regarding your preparation for the procedure.  Normal BMI (Body Mass Index- based on height and weight) is between 23 and 30. Your BMI today is Body mass index is 30.01 kg/m. Marland Kitchen Please consider follow up  regarding your BMI with your Primary Care Provider.  Thank you for choosing me and [facility].  Pricilla Riffle. Dagoberto Ligas., MD., Marval Regal

## 2017-05-20 ENCOUNTER — Inpatient Hospital Stay: Payer: Medicare Other

## 2017-05-20 ENCOUNTER — Inpatient Hospital Stay: Payer: Medicare Other | Attending: Oncology | Admitting: Oncology

## 2017-05-20 ENCOUNTER — Other Ambulatory Visit: Payer: Self-pay | Admitting: *Deleted

## 2017-05-20 VITALS — BP 153/94 | HR 65 | Temp 98.7°F | Resp 16 | Wt 244.1 lb

## 2017-05-20 DIAGNOSIS — N289 Disorder of kidney and ureter, unspecified: Secondary | ICD-10-CM | POA: Insufficient documentation

## 2017-05-20 DIAGNOSIS — D649 Anemia, unspecified: Secondary | ICD-10-CM | POA: Insufficient documentation

## 2017-05-20 DIAGNOSIS — D472 Monoclonal gammopathy: Secondary | ICD-10-CM | POA: Diagnosis not present

## 2017-05-20 DIAGNOSIS — N189 Chronic kidney disease, unspecified: Principal | ICD-10-CM

## 2017-05-20 DIAGNOSIS — Z923 Personal history of irradiation: Secondary | ICD-10-CM | POA: Diagnosis not present

## 2017-05-20 DIAGNOSIS — C169 Malignant neoplasm of stomach, unspecified: Secondary | ICD-10-CM

## 2017-05-20 DIAGNOSIS — Z9221 Personal history of antineoplastic chemotherapy: Secondary | ICD-10-CM | POA: Diagnosis not present

## 2017-05-20 DIAGNOSIS — D631 Anemia in chronic kidney disease: Secondary | ICD-10-CM

## 2017-05-20 DIAGNOSIS — Z8546 Personal history of malignant neoplasm of prostate: Secondary | ICD-10-CM | POA: Diagnosis not present

## 2017-05-20 DIAGNOSIS — Z79899 Other long term (current) drug therapy: Secondary | ICD-10-CM | POA: Diagnosis not present

## 2017-05-20 DIAGNOSIS — Z85028 Personal history of other malignant neoplasm of stomach: Secondary | ICD-10-CM | POA: Diagnosis not present

## 2017-05-20 LAB — COMPREHENSIVE METABOLIC PANEL
ALK PHOS: 55 U/L (ref 40–150)
ALT: 13 U/L (ref 0–55)
AST: 18 U/L (ref 5–34)
Albumin: 3.2 g/dL — ABNORMAL LOW (ref 3.5–5.0)
Anion gap: 3 (ref 3–11)
BUN: 25 mg/dL (ref 7–26)
CALCIUM: 10 mg/dL (ref 8.4–10.4)
CO2: 28 mmol/L (ref 22–29)
CREATININE: 2.02 mg/dL — AB (ref 0.70–1.30)
Chloride: 107 mmol/L (ref 98–109)
GFR, EST AFRICAN AMERICAN: 36 mL/min — AB (ref >60)
GFR, EST NON AFRICAN AMERICAN: 31 mL/min — AB (ref >60)
Glucose, Bld: 81 mg/dL (ref 70–140)
Potassium: 4.4 mmol/L (ref 3.5–5.1)
SODIUM: 138 mmol/L (ref 136–145)
Total Bilirubin: 0.3 mg/dL (ref 0.2–1.2)
Total Protein: 6.9 g/dL (ref 6.4–8.3)

## 2017-05-20 LAB — CBC WITH DIFFERENTIAL/PLATELET
BASOS PCT: 1 %
Basophils Absolute: 0.1 10*3/uL (ref 0.0–0.1)
EOS PCT: 6 %
Eosinophils Absolute: 0.3 10*3/uL (ref 0.0–0.5)
HCT: 30.5 % — ABNORMAL LOW (ref 38.4–49.9)
Hemoglobin: 10.2 g/dL — ABNORMAL LOW (ref 13.0–17.1)
Lymphocytes Relative: 24 %
Lymphs Abs: 1.2 10*3/uL (ref 0.9–3.3)
MCH: 32.9 pg (ref 27.2–33.4)
MCHC: 33.3 g/dL (ref 32.0–36.0)
MCV: 98.6 fL — ABNORMAL HIGH (ref 79.3–98.0)
MONOS PCT: 9 %
Monocytes Absolute: 0.5 10*3/uL (ref 0.1–0.9)
NEUTROS PCT: 60 %
Neutro Abs: 3.2 10*3/uL (ref 1.5–6.5)
PLATELETS: 189 10*3/uL (ref 140–400)
RBC: 3.09 MIL/uL — AB (ref 4.20–5.82)
RDW: 15.3 % — ABNORMAL HIGH (ref 11.0–14.6)
WBC: 5.3 10*3/uL (ref 4.0–10.3)

## 2017-05-20 MED ORDER — HEPARIN SOD (PORK) LOCK FLUSH 100 UNIT/ML IV SOLN
500.0000 [IU] | Freq: Once | INTRAVENOUS | Status: AC | PRN
  Administered 2017-05-20: 500 [IU]
  Filled 2017-05-20: qty 5

## 2017-05-20 MED ORDER — PROCHLORPERAZINE MALEATE 10 MG PO TABS: 10.0000 mg | ORAL_TABLET | Freq: Four times a day (QID) | ORAL | 0 refills | Status: DC | PRN

## 2017-05-20 MED ORDER — SODIUM CHLORIDE 0.9 % IJ SOLN
10.0000 mL | INTRAMUSCULAR | Status: DC | PRN
  Administered 2017-05-20: 10 mL
  Filled 2017-05-20: qty 10

## 2017-05-20 NOTE — Progress Notes (Signed)
Hematology and Oncology Follow Up Visit  Jacob [name] [id number] June 28, 1945 72 y.o. 05/20/2017 10:27 AM Jacob [name], Jacob [name], MDAvva, Ravisankar, MD   Principle Diagnosis:  72 year old with:   1. 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  2. Prostate cancer in 2013 with a Gleason score 3+4 = 7 and a PSA 5.66 stage TIc.  He remains disease-free at this time.  3. 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 His staging by EUS showed T3 N2 disease without any evidence of metastatic disease.  Status post surgical resection and remains disease free.   Prior Therapy:  He is status post radiation therapy for definitive treatment for his prostate cancer. Therapy concluded in December 2013. He is status post packed red cell transfusions in April 2017. He is status post bone marrow biopsy in April 2017. Results did not show any plasma cell disorder or myelodysplasia. He is status post IV iron infusion completed in April 2017. This was repeated in September 2017. FOLFOX chemotherapy with cycle 1 to be given on 12/12/2015.  He is S/P cycle 5 of therapy given on 02/20/2016. He is status post distal gastrectomy and Billroth II reconstruction completed on 03/25/2016. The final pathology revealed a T3 N0 residual tumor without any lymphadenopathy involved.  Current therapy: Observation and surveillance.   Interim History:  Jacob [name] presents today for a follow-up visit.  Since the last visit, he reports no major changes in his health.  Continues to recover from his operation and have regained most activities of daily living.  He is able to drive short distances and was able to do it today without difficulties.  He denies any hematochezia or melena.  He denies any excessive fatigue or tiredness.  His appetite remained reasonable without any GI complaints.   He does not report any headaches, blurry vision, syncope or seizures. He does  not report any fevers, chills, sweats or weight loss. Does not report any chest pain, palpitation, orthopnea or leg edema. He does not report any cough, wheezing or hemoptysis. He does not report any frequency, urgency or hesitancy. He does not report any skeletal complaints of arthralgias or myalgias.  He reports no changes in his mood.  He denies any petechiae or ecchymosis.  Remaining review of systems is negative.  Medications: I have reviewed the patient's current medications.  Current Outpatient Medications  Medication Sig Dispense Refill  . acetaminophen (TYLENOL) 500 MG tablet Take 1,000 mg by mouth daily as needed for moderate pain or headache.    . allopurinol (ZYLOPRIM) 300 MG tablet Take 300 mg by mouth daily.     Marland Kitchen buPROPion (WELLBUTRIN XL) 300 MG 24 hr tablet Take 300 mg by mouth daily.     . calcium carbonate (TUMS - DOSED IN MG ELEMENTAL CALCIUM) 500 MG chewable tablet Chew 3 tablets by mouth 2 (two) times daily as needed for indigestion or heartburn.    . diazepam (VALIUM) 5 MG tablet Take 5 mg by mouth at bedtime as needed (sleep).     . Empagliflozin (JARDIANCE PO) Take by mouth daily.    . furosemide (LASIX) 40 MG tablet Take 40 mg by mouth every other day.    . furosemide (LASIX) 80 MG tablet     . gabapentin (NEURONTIN) 800 MG tablet Take 800 mg by mouth at bedtime.     . lidocaine-prilocaine (EMLA) cream Apply 1 application topically as needed. Apply to port before chemotherapy. 30 g 0  . Melatonin  10 MG TABS Take 1 tablet by mouth at bedtime as needed (sleep).     . metoCLOPramide (REGLAN) 5 MG/5ML solution Take 10 mLs (10 mg total) by mouth 4 (four) times daily -  before meals and at bedtime. 120 mL 0  . Nebivolol HCl (BYSTOLIC) 20 MG TABS Take 20 mg by mouth daily.     . Nutritional Supplements (ENSURE ENLIVE PO) Take 237 mLs by mouth 2 (two) times daily.     . ondansetron (ZOFRAN-ODT) 4 MG disintegrating tablet Take 1 tablet (4 mg total) by mouth every 6 (six) hours as  needed for nausea. 20 tablet 3  . pantoprazole (PROTONIX) 40 MG tablet Take 1 tablet (40 mg total) by mouth daily. 30 tablet 3  . polycarbophil (FIBERCON) 625 MG tablet Take 1 tablet (625 mg total) by mouth daily. 30 tablet 3  . prochlorperazine (COMPAZINE) 10 MG tablet Take 1 tablet (10 mg total) by mouth every 6 (six) hours as needed for nausea or vomiting. 30 tablet 0  . tamsulosin (FLOMAX) 0.4 MG CAPS capsule TAKE 1 CAPSULE BY MOUTH DAILY 30 capsule 2  . traZODone (DESYREL) 50 MG tablet Take 50 mg by mouth at bedtime.     . Verapamil HCl CR 300 MG CP24 Take 300 mg by mouth at bedtime.     . Water For Irrigation, Sterile (FREE WATER) SOLN Place 100 mLs into feeding tube every 4 (four) hours. 1000 mL 0   No current facility-administered medications for this visit.    Facility-Administered Medications Ordered in Other Visits  Medication Dose Route Frequency Provider Last Rate Last Dose  . sodium chloride 0.9 % injection 10 mL  10 mL Intracatheter PRN Wyatt Portela, MD   10 mL at 05/20/17 1025     Allergies:  No Known Allergies  Past Medical History, Surgical history, Social history, and Family History updated without any changes..   Physical Exam: Blood pressure (!) 153/94, pulse 65, temperature 98.7 F (37.1 C), temperature source Oral, resp. rate 16, weight 244 lb 1 oz (110.7 kg), SpO2 97 %.   ECOG: 1 General appearance: Alert, awake gentleman without distress. Eyes: Sclera anicteric. Oral mucosa: His membranes are moist and pink. Lymph nodes: No lymphadenopathy noted in the cervical, axillary or supraclavicular regions. Heart: Regular rate and rhythm without murmurs. Lung: Clear in all lung fields without any rhonchi, wheezes or dullness to percussion. Abdomin: Soft, nontender without any rebound or guarding. Musculoskeletal: No clubbing or cyanosis. Skin: No ecchymosis or petechiae.    Lab Results: Lab Results  Component Value Date   WBC 6.2 01/16/2017   HGB 10.4  (L) 01/16/2017   HCT 32.6 (L) 01/16/2017   MCV 101.9 (H) 01/16/2017   PLT 191 01/16/2017     Chemistry      Component Value Date/Time   NA 141 01/16/2017 0924   K 4.8 01/16/2017 0924   CL 111 04/20/2016 0422   CO2 25 01/16/2017 0924   BUN 24.0 01/16/2017 0924   CREATININE 1.9 (H) 01/16/2017 0924      Component Value Date/Time   CALCIUM 9.8 01/16/2017 0924   ALKPHOS 54 01/16/2017 0924   AST 16 01/16/2017 0924   ALT 10 01/16/2017 0924   BILITOT 0.54 01/16/2017 0924      IMPRESSION: 1. Partial gastrectomy. Proximal gastric hypermetabolism may be physiologic. No evidence of metastatic disease. 2. Soft tissue nodule in the anterior left chest wall is no longer hypermetabolic. 3. 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 Lesion has decreased minimally in size from 04/08/2016. 4. Aortic atherosclerosis (ICD10-170.0). Coronary artery calcification. 5. Mild patchy peribronchovascular ground-glass in the right upper lobe is new and likely infectious or inflammatory in etiology.   Impression and Plan:  72 year old woman with the following issues:    1. Adenocarcinoma of the stomach presented with T3 N2 disease by EUS criteria.  He received neoadjuvant chemotherapy followed by distal gastrectomy in March 2018.  The final pathological staging was T3N0.  He remains on active surveillance at this time without any evidence of recurrent disease.  Imaging studies obtained in January 2019 showed no evidence of recurrent disease.  Plan is to continue with active surveillance and repeat imaging in 4 months.  2.  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  His hemoglobin remained stable without any need for intervention.  The plan is to repeat iron studies to make sure adequate iron stores and consider growth factor support in the form of Aranesp in the future if his hemoglobin drops  further.   3.  IgG lambda MGUS.  Protein studies showed an M spike of 0.2 g/dL which have not changed dramatically.  No evidence to suggest active myeloma at this time.  And will continue to monitor protein studies annually.  4. Renal insufficiency: Kidney function remains stable at this time followed by nephrology.  5. IV access: Port-A-Cath will be flushed periodically.  Risks and benefits of removing his Port-A-Cath was discussed today and the plan is to continue to maintain it for the time being.  6. Follow-up: In 4 months after repeat CT scan.  15 minutes was spent on today's visit.  More than 50% spent face-to-face providing counseling, education and answering questions regarding future line of care.  Zola Button, MD 5/8/201910:27 AM

## 2017-06-08 ENCOUNTER — Encounter: Payer: Self-pay | Admitting: Gastroenterology

## 2017-06-16 ENCOUNTER — Encounter: Payer: Self-pay | Admitting: Gastroenterology

## 2017-06-16 ENCOUNTER — Ambulatory Visit (AMBULATORY_SURGERY_CENTER): Payer: Medicare Other | Admitting: Gastroenterology

## 2017-06-16 ENCOUNTER — Other Ambulatory Visit: Payer: Self-pay

## 2017-06-16 VITALS — BP 152/80 | HR 61 | Temp 97.3°F | Resp 13 | Ht 75.0 in | Wt 244.0 lb

## 2017-06-16 DIAGNOSIS — Z86010 Personal history of colonic polyps: Secondary | ICD-10-CM

## 2017-06-16 DIAGNOSIS — D124 Benign neoplasm of descending colon: Secondary | ICD-10-CM | POA: Diagnosis not present

## 2017-06-16 DIAGNOSIS — Z85028 Personal history of other malignant neoplasm of stomach: Secondary | ICD-10-CM | POA: Diagnosis not present

## 2017-06-16 DIAGNOSIS — D123 Benign neoplasm of transverse colon: Secondary | ICD-10-CM

## 2017-06-16 DIAGNOSIS — R634 Abnormal weight loss: Secondary | ICD-10-CM

## 2017-06-16 DIAGNOSIS — R6881 Early satiety: Secondary | ICD-10-CM

## 2017-06-16 DIAGNOSIS — D120 Benign neoplasm of cecum: Secondary | ICD-10-CM

## 2017-06-16 MED ORDER — SODIUM CHLORIDE 0.9 % IV SOLN: 500.0000 mL | Freq: Once | INTRAVENOUS | Status: DC

## 2017-06-16 NOTE — Patient Instructions (Signed)
Handouts given:  Polyps, Hemorrhoids, Diverticulosis, and Gastritis.     YOU HAD AN ENDOSCOPIC PROCEDURE TODAY AT [facility]:   Refer to the procedure report that was given to you for any specific questions about what was found during the examination.  If the procedure report does not answer your questions, please call your gastroenterologist to clarify.  If you requested that your care partner not be given the details of your procedure findings, then the procedure report has been included in a sealed envelope for you to review at your convenience later.  YOU SHOULD EXPECT: Some feelings of bloating in the abdomen. Passage of more gas than usual.  Walking can help get rid of the air that was put into your GI tract during the procedure and reduce the bloating. If you had a lower endoscopy (such as a colonoscopy or flexible sigmoidoscopy) you may notice spotting of blood in your stool or on the toilet paper. If you underwent a bowel prep for your procedure, you may not have a normal bowel movement for a few days.  Please Note:  You might notice some irritation and congestion in your nose or some drainage.  This is from the oxygen used during your procedure.  There is no need for concern and it should clear up in a day or so.  SYMPTOMS TO REPORT IMMEDIATELY:   Following lower endoscopy (colonoscopy or flexible sigmoidoscopy):  Excessive amounts of blood in the stool  Significant tenderness or worsening of abdominal pains  Swelling of the abdomen that is new, acute  Fever of 100F or higher   Following upper endoscopy (EGD)  Vomiting of blood or coffee ground material  New chest pain or pain under the shoulder blades  Painful or persistently difficult swallowing  New shortness of breath  Fever of 100F or higher  Black, tarry-looking stools  For urgent or emergent issues, a gastroenterologist can be reached at any hour by calling [phone].   DIET:  We do recommend a  small meal at first, but then you may proceed to your regular diet.  Drink plenty of fluids but you should avoid alcoholic beverages for 24 hours.  ACTIVITY:  You should plan to take it easy for the rest of today and you should NOT DRIVE or use heavy machinery until tomorrow (because of the sedation medicines used during the test).    FOLLOW UP: Our staff will call the number listed on your records the next business day following your procedure to check on you and address any questions or concerns that you may have regarding the information given to you following your procedure. If we do not reach you, we will leave a message.  However, if you are feeling well and you are not experiencing any problems, there is no need to return our call.  We will assume that you have returned to your regular daily activities without incident.  If any biopsies were taken you will be contacted by phone or by letter within the next 1-3 weeks.  Please call us at [phone] if you have not heard about the biopsies in 3 weeks.    SIGNATURES/CONFIDENTIALITY: You and/or your care partner have signed paperwork which will be entered into your electronic medical record.  These signatures attest to the fact that that the information above on your After Visit Summary has been reviewed and is understood.  Full responsibility of the confidentiality of this discharge information lies with you and/or your care-partner.

## 2017-06-16 NOTE — Progress Notes (Signed)
Called to room to assist during endoscopic procedure.  Patient ID and intended procedure confirmed with present staff. Received instructions for my participation in the procedure from the performing physician.  

## 2017-06-16 NOTE — Op Note (Signed)
Jacob [name]: Jacob [name] Procedure Date: 06/16/2017 10:28 AM MRN: [id number] Endoscopist: Ladene Artist , MD Age: 72 Referring MD:  Date of Birth: March 17, 1945 Gender: Male Account #: [id number] Procedure:                Colonoscopy Indications:              Surveillance: Personal history of adenomatous                            polyps on last colonoscopy 5 years ago Medicines:                Monitored Anesthesia Care Procedure:                Pre-Anesthesia Assessment:                           - Prior to the procedure, a History and Physical                            was performed, and patient medications and                            allergies were reviewed. The patient's tolerance of                            previous anesthesia was also reviewed. The risks                            and benefits of the procedure and the sedation                            options and risks were discussed with the patient.                            All questions were answered, and informed consent                            was obtained. Prior Anticoagulants: The patient has                            taken no previous anticoagulant or antiplatelet                            agents. ASA Grade Assessment: II - A patient with                            mild systemic disease. After reviewing the risks                            and benefits, the patient was deemed in                            satisfactory condition to undergo the procedure.  After obtaining informed consent, the colonoscope                            was passed under direct vision. Throughout the                            procedure, the patient's blood pressure, pulse, and                            oxygen saturations were monitored continuously. The                            Colonoscope was introduced through the anus and                            advanced to the the  cecum, identified by                            appendiceal orifice and ileocecal valve. The                            ileocecal valve, appendiceal orifice, and rectum                            were photographed. The quality of the bowel                            preparation was fair. Several areas of solid stool                            could not be cleared. The colonoscopy was performed                            without difficulty. The patient tolerated the                            procedure well. Scope In: 10:39:42 AM Scope Out: 10:55:36 AM Scope Withdrawal Time: 0 hours 12 minutes 10 seconds  Total Procedure Duration: 0 hours 15 minutes 54 seconds  Findings:                 The perianal and digital rectal examinations were                            normal.                           A 4 mm polyp was found in the cecum. The polyp was                            sessile. The polyp was removed with a cold biopsy                            forceps. Resection and retrieval were complete.  Two sessile and semi-pedunculated polyps were found                            in the descending colon and transverse colon. The                            polyps were 6 to 8 mm in size. These polyps were                            removed with a cold snare. Resection and retrieval                            were complete.                           Multiple medium-mouthed diverticula were found in                            the left colon. There was no evidence of                            diverticular bleeding.                           Internal hemorrhoids were found during                            retroflexion. The hemorrhoids were small and Grade                            I (internal hemorrhoids that do not prolapse).                           The exam was otherwise without abnormality on                            direct and retroflexion views. Complications:             No immediate complications. Estimated blood loss:                            None. Estimated Blood Loss:     Estimated blood loss: none. Impression:               - Preparation of the colon was fair.                           - One 4 mm polyp in the cecum, removed with a cold                            biopsy forceps. Resected and retrieved.                           - Two 6 to 8 mm polyps in the descending colon and  in the transverse colon, removed with a cold snare.                            Resected and retrieved.                           - Moderate diverticulosis in the left colon. There                            was no evidence of diverticular bleeding.                           - Internal hemorrhoids.                           - The examination was otherwise normal on direct                            and retroflexion views. Recommendation:           - Repeat colonoscopy in 2 years because the bowel                            preparation was suboptimal with a 2 day bowel prep.                           - Patient has a contact number available for                            emergencies. The signs and symptoms of potential                            delayed complications were discussed with the                            patient. Return to normal activities tomorrow.                            Written discharge instructions were provided to the                            patient.                           - Resume previous diet.                           - Continue present medications.                           - Await pathology results. Ladene Artist, MD 06/16/2017 11:09:35 AM This report has been signed electronically.

## 2017-06-16 NOTE — Progress Notes (Signed)
A and O x3. Report to RN. Tolerated MAC anesthesia well.Gums unchanged after procedure. 

## 2017-06-16 NOTE — Op Note (Signed)
[facility] Patient Name: Jacob [name] Procedure Date: 06/16/2017 10:28 AM MRN: [id number] Endoscopist: Ladene Artist , MD Age: 72 Referring MD:  Date of Birth: Oct 05, 1945 Gender: Male Account #: [id number] Procedure:                Upper GI endoscopy Indications:              Early satiety, Weight loss Medicines:                Monitored Anesthesia Care Procedure:                Pre-Anesthesia Assessment:                           - Prior to the procedure, a History and Physical                            was performed, and patient medications and                            allergies were reviewed. The patient's tolerance of                            previous anesthesia was also reviewed. The risks                            and benefits of the procedure and the sedation                            options and risks were discussed with the patient.                            All questions were answered, and informed consent                            was obtained. Prior Anticoagulants: The patient has                            taken no previous anticoagulant or antiplatelet                            agents. ASA Grade Assessment: II - A patient with                            mild systemic disease. After reviewing the risks                            and benefits, the patient was deemed in                            satisfactory condition to undergo the procedure.                           After obtaining informed consent, the endoscope was  passed under direct vision. Throughout the                            procedure, the patient's blood pressure, pulse, and                            oxygen saturations were monitored continuously. The                            Endoscope was introduced through the mouth, and                            advanced to the efferent jejunal loop. The upper GI                            endoscopy was  accomplished without difficulty. The                            patient tolerated the procedure well. Scope In: Scope Out: Findings:                 The examined esophagus was normal.                           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 The gastrojejunal anastomosis was                            characterized by healthy appearing mucosa. This was                            traversed. The efferent limb was examined 15 cm                            from anastomosis and was characterized by healthy                            appearing mucosa. The afferent limb was examined 15                            cm from anastomosis and was characterized by                            healthy appearing mucosa.                           A single 8 mm sessile polyp with no bleeding and no                            stigmata of recent bleeding was found in the  gastric body at the anastomosis. Biopsies were                            taken with a cold forceps for histology.                           Diffuse moderate inflammation characterized by                            erythema, friability and granularity was found in                            the gastric fundus and in the gastric body.                            Biopsies were taken with a cold forceps for                            histology.                           The exam of the stomach was otherwise normal. Complications:            No immediate complications. Estimated Blood Loss:     Estimated blood loss was minimal. Impression:               - Normal esophagus.                           - Patent Billroth II gastrojejunostomy was found,                            characterized by healthy appearing mucosa.                           - A single gastric polyp at anastomosis. Biopsied.                           - Gastritis. Biopsied.                            - Normal appearing afferent and efferent limbs. Recommendation:           - Patient has a contact number available for                            emergencies. The signs and symptoms of potential                            delayed complications were discussed with the                            patient. Return to normal activities tomorrow.                            Written discharge instructions were provided to the  patient.                           - Resume previous diet.                           - Continue present medications.                           - Await pathology results. Ladene Artist, MD 06/16/2017 11:17:07 AM This report has been signed electronically.

## 2017-06-17 ENCOUNTER — Telehealth: Payer: Self-pay

## 2017-06-17 NOTE — Telephone Encounter (Signed)
Called [phone] and left a messaged we tried to reach pt for a follow up call. maw

## 2017-06-17 NOTE — Telephone Encounter (Signed)
  Follow up Call-  Call back number 06/16/2017  Post procedure Call Back phone  # [phone]  Permission to leave phone message Yes  Some recent data might be hidden     Patient questions:  Do you have a fever, pain , or abdominal swelling? No. Pain Score  0 *  Have you tolerated food without any problems? Yes.    Have you been able to return to your normal activities? Yes.    Do you have any questions about your discharge instructions: Diet   No. Medications  No. Follow up visit  No.  Do you have questions or concerns about your Care? No.  Actions: * If pain score is 4 or above: No action needed, pain <4.  No problems noted per pt. maw

## 2017-06-18 ENCOUNTER — Encounter: Payer: Self-pay | Admitting: Gastroenterology

## 2017-07-15 ENCOUNTER — Inpatient Hospital Stay: Payer: Medicare Other | Attending: Oncology

## 2017-07-15 VITALS — BP 139/83 | HR 69 | Temp 98.0°F | Resp 18

## 2017-07-15 DIAGNOSIS — N289 Disorder of kidney and ureter, unspecified: Secondary | ICD-10-CM | POA: Diagnosis present

## 2017-07-15 DIAGNOSIS — D631 Anemia in chronic kidney disease: Secondary | ICD-10-CM | POA: Insufficient documentation

## 2017-07-15 DIAGNOSIS — N189 Chronic kidney disease, unspecified: Secondary | ICD-10-CM

## 2017-07-15 DIAGNOSIS — Z452 Encounter for adjustment and management of vascular access device: Secondary | ICD-10-CM | POA: Diagnosis not present

## 2017-07-15 MED ORDER — SODIUM CHLORIDE 0.9 % IJ SOLN
3.0000 mL | Freq: Once | INTRAMUSCULAR | Status: AC | PRN
  Administered 2017-07-15: 3 mL via INTRAVENOUS
  Filled 2017-07-15: qty 10

## 2017-07-15 MED ORDER — HEPARIN SOD (PORK) LOCK FLUSH 100 UNIT/ML IV SOLN
250.0000 [IU] | Freq: Once | INTRAVENOUS | Status: AC | PRN
  Administered 2017-07-15: 250 [IU]
  Filled 2017-07-15: qty 5

## 2017-07-15 NOTE — Patient Instructions (Signed)
Implanted Port Home Guide An implanted port is a type of central line that is placed under the skin. Central lines are used to provide IV access when treatment or nutrition needs to be given through a person's veins. Implanted ports are used for long-term IV access. An implanted port may be placed because:  You need IV medicine that would be irritating to the small veins in your hands or arms.  You need long-term IV medicines, such as antibiotics.  You need IV nutrition for a long period.  You need frequent blood draws for lab tests.  You need dialysis.  Implanted ports are usually placed in the chest area, but they can also be placed in the upper arm, the abdomen, or the leg. An implanted port has two main parts:  Reservoir. The reservoir is round and will appear as a small, raised area under your skin. The reservoir is the part where a needle is inserted to give medicines or draw blood.  Catheter. The catheter is a thin, flexible tube that extends from the reservoir. The catheter is placed into a large vein. Medicine that is inserted into the reservoir goes into the catheter and then into the vein.  How will I care for my incision site? Do not get the incision site wet. Bathe or shower as directed by your health care provider. How is my port accessed? Special steps must be taken to access the port:  Before the port is accessed, a numbing cream can be placed on the skin. This helps numb the skin over the port site.  Your health care provider uses a sterile technique to access the port. ? Your health care provider must put on a mask and sterile gloves. ? The skin over your port is cleaned carefully with an antiseptic and allowed to dry. ? The port is gently pinched between sterile gloves, and a needle is inserted into the port.  Only "non-coring" port needles should be used to access the port. Once the port is accessed, a blood return should be checked. This helps ensure that the port  is in the vein and is not clogged.  If your port needs to remain accessed for a constant infusion, a clear (transparent) bandage will be placed over the needle site. The bandage and needle will need to be changed every week, or as directed by your health care provider.  Keep the bandage covering the needle clean and dry. Do not get it wet. Follow your health care provider's instructions on how to take a shower or bath while the port is accessed.  If your port does not need to stay accessed, no bandage is needed over the port.  What is flushing? Flushing helps keep the port from getting clogged. Follow your health care provider's instructions on how and when to flush the port. Ports are usually flushed with saline solution or a medicine called heparin. The need for flushing will depend on how the port is used.  If the port is used for intermittent medicines or blood draws, the port will need to be flushed: ? After medicines have been given. ? After blood has been drawn. ? As part of routine maintenance.  If a constant infusion is running, the port may not need to be flushed.  How long will my port stay implanted? The port can stay in for as long as your health care provider thinks it is needed. When it is time for the port to come out, surgery will be   done to remove it. The procedure is similar to the one performed when the port was put in. When should I seek immediate medical care? When you have an implanted port, you should seek immediate medical care if:  You notice a bad smell coming from the incision site.  You have swelling, redness, or drainage at the incision site.  You have more swelling or pain at the port site or the surrounding area.  You have a fever that is not controlled with medicine.  This information is not intended to replace advice given to you by your health care provider. Make sure you discuss any questions you have with your health care provider. Document  Released: 12/30/2004 Document Revised: 06/07/2015 Document Reviewed: 09/06/2012 Elsevier Interactive Patient Education  2017 [employer]  

## 2017-07-24 ENCOUNTER — Ambulatory Visit: Payer: Medicare Other | Admitting: Podiatry

## 2017-07-31 ENCOUNTER — Encounter: Payer: Self-pay | Admitting: Podiatry

## 2017-07-31 ENCOUNTER — Ambulatory Visit: Payer: Medicare Other | Admitting: Podiatry

## 2017-07-31 DIAGNOSIS — M79674 Pain in right toe(s): Secondary | ICD-10-CM | POA: Diagnosis not present

## 2017-07-31 DIAGNOSIS — G609 Hereditary and idiopathic neuropathy, unspecified: Secondary | ICD-10-CM | POA: Diagnosis not present

## 2017-07-31 DIAGNOSIS — B351 Tinea unguium: Secondary | ICD-10-CM | POA: Diagnosis not present

## 2017-07-31 DIAGNOSIS — M79675 Pain in left toe(s): Secondary | ICD-10-CM

## 2017-07-31 DIAGNOSIS — L84 Corns and callosities: Secondary | ICD-10-CM | POA: Diagnosis not present

## 2017-08-02 NOTE — Progress Notes (Signed)
Subjective: [name] is seen today for preventative foot care. He presents today with peripheral neuropathy and management of painful, discolored, thick toenails and painful calluses b/l feet.  Pain is aggravated when wearing enclosed shoe gear. Pain is relieved with periodic professional debridement.  He voices no new concerns on today's visit.  Objective: There were no vitals filed for this visit. Vascular Examination: Capillary refill time <3 seconds x 10 digits Dorsalis pedis and Posterior tibial pulses present b/l No digital hair x 10 digits Skin temperature warm to cool b/l  Dermatological Examination: Skin with normal turgor, texture and tone b/l Toenails 1-5 b/l discolored, thick, dystrophic with subungual debris and pain with palpation to nailbeds due to thickness of nails. Hyperkeratotic lesion submetatarsal head 4 b/l and submetatarsal head 3 right foot.  Musculoskeletal: Muscle strength 5/5 to all LE muscle groups  Neurological: Sensation diminished with 10 gram monofilament. Vibratory sensation diminished  Assessment: 1. Painful onychomycosis toenails 1-5 b/l 2. Calluses submet 4 b/l and submet 3 right foot 3. Peripheral neuropathy  Plan:  1. Toenails 1-5 b/l were debrided in length and girth without iatrogenic bleeding. 2. Hyperkeratotic lesion pared with sterile chisel blade submet 4 b/l and submet 3 right foot 3. Patient to continue soft, supportive shoe gear 4. Patient to report any pedal injuries to medical professional  5. Follow up 3 months.  6. Patient to call should there be a concern in the interim.

## 2017-09-11 ENCOUNTER — Telehealth: Payer: Self-pay | Admitting: *Deleted

## 2017-09-11 NOTE — Telephone Encounter (Signed)
"  I need to know when to drink the contrast for CT scan September 3rd.  I've had it for two months."  Instructed to drink first bottle at 10:00 am; second bottle at 11:00 am.  [facility] at 11:45 am for CT A/P. Arrive to [facility] at 10:30 am for registation for Lab/Flush before scans.  Denies further needs or questions at this time.

## 2017-09-15 ENCOUNTER — Inpatient Hospital Stay: Payer: Medicare Other | Attending: Oncology

## 2017-09-15 ENCOUNTER — Ambulatory Visit (HOSPITAL_COMMUNITY)
Admission: RE | Admit: 2017-09-15 | Discharge: 2017-09-15 | Disposition: A | Discharge status: Home / Self Care | Payer: Medicare Other | Source: Ambulatory Visit | Attending: Oncology | Admitting: Oncology

## 2017-09-15 ENCOUNTER — Inpatient Hospital Stay: Payer: Medicare Other

## 2017-09-15 DIAGNOSIS — Z923 Personal history of irradiation: Secondary | ICD-10-CM | POA: Insufficient documentation

## 2017-09-15 DIAGNOSIS — D472 Monoclonal gammopathy: Secondary | ICD-10-CM | POA: Insufficient documentation

## 2017-09-15 DIAGNOSIS — D631 Anemia in chronic kidney disease: Secondary | ICD-10-CM | POA: Diagnosis present

## 2017-09-15 DIAGNOSIS — C169 Malignant neoplasm of stomach, unspecified: Secondary | ICD-10-CM

## 2017-09-15 DIAGNOSIS — N189 Chronic kidney disease, unspecified: Secondary | ICD-10-CM

## 2017-09-15 DIAGNOSIS — Z452 Encounter for adjustment and management of vascular access device: Secondary | ICD-10-CM | POA: Diagnosis present

## 2017-09-15 DIAGNOSIS — Z8546 Personal history of malignant neoplasm of prostate: Secondary | ICD-10-CM | POA: Diagnosis not present

## 2017-09-15 DIAGNOSIS — Z85028 Personal history of other malignant neoplasm of stomach: Secondary | ICD-10-CM | POA: Diagnosis not present

## 2017-09-15 DIAGNOSIS — Z9221 Personal history of antineoplastic chemotherapy: Secondary | ICD-10-CM | POA: Insufficient documentation

## 2017-09-15 DIAGNOSIS — Z903 Acquired absence of stomach [part of]: Secondary | ICD-10-CM | POA: Insufficient documentation

## 2017-09-15 DIAGNOSIS — Z95828 Presence of other vascular implants and grafts: Secondary | ICD-10-CM

## 2017-09-15 DIAGNOSIS — Z9889 Other specified postprocedural states: Secondary | ICD-10-CM | POA: Insufficient documentation

## 2017-09-15 DIAGNOSIS — Z79899 Other long term (current) drug therapy: Secondary | ICD-10-CM | POA: Insufficient documentation

## 2017-09-15 DIAGNOSIS — K55069 Acute infarction of intestine, part and extent unspecified: Secondary | ICD-10-CM | POA: Diagnosis not present

## 2017-09-15 LAB — CMP (CANCER CENTER ONLY)
ALT: <6 U/L (ref 0–44)
AST: 12 U/L — ABNORMAL LOW (ref 15–41)
Albumin: 3.4 g/dL — ABNORMAL LOW (ref 3.5–5.0)
Alkaline Phosphatase: 58 U/L (ref 38–126)
Anion gap: 8 (ref 5–15)
BILIRUBIN TOTAL: 0.5 mg/dL (ref 0.3–1.2)
BUN: 23 mg/dL (ref 8–23)
CO2: 24 mmol/L (ref 22–32)
Calcium: 9.9 mg/dL (ref 8.9–10.3)
Chloride: 106 mmol/L (ref 98–111)
Creatinine: 1.99 mg/dL — ABNORMAL HIGH (ref 0.61–1.24)
GFR, EST AFRICAN AMERICAN: 37 mL/min — AB (ref >60)
GFR, EST NON AFRICAN AMERICAN: 32 mL/min — AB (ref >60)
Glucose, Bld: 83 mg/dL (ref 70–99)
Potassium: 4.2 mmol/L (ref 3.5–5.1)
Sodium: 138 mmol/L (ref 135–145)
TOTAL PROTEIN: 7.2 g/dL (ref 6.5–8.1)

## 2017-09-15 LAB — CBC WITH DIFFERENTIAL (CANCER CENTER ONLY)
BASOS ABS: 0.1 10*3/uL (ref 0.0–0.1)
Basophils Relative: 1 %
EOS PCT: 6 %
Eosinophils Absolute: 0.3 10*3/uL (ref 0.0–0.5)
HCT: 31.4 % — ABNORMAL LOW (ref 38.4–49.9)
HEMOGLOBIN: 10.4 g/dL — AB (ref 13.0–17.1)
LYMPHS ABS: 0.9 10*3/uL (ref 0.9–3.3)
Lymphocytes Relative: 21 %
MCH: 32.3 pg (ref 27.2–33.4)
MCHC: 33.1 g/dL (ref 32.0–36.0)
MCV: 97.5 fL (ref 79.3–98.0)
Monocytes Absolute: 0.4 10*3/uL (ref 0.1–0.9)
Monocytes Relative: 10 %
NEUTROS PCT: 62 %
Neutro Abs: 2.9 10*3/uL (ref 1.5–6.5)
PLATELETS: 179 10*3/uL (ref 140–400)
RBC: 3.23 MIL/uL — AB (ref 4.20–5.82)
RDW: 15 % — ABNORMAL HIGH (ref 11.0–14.6)
WBC: 4.6 10*3/uL (ref 4.0–10.3)

## 2017-09-15 LAB — FERRITIN: FERRITIN: 87 ng/mL (ref 24–336)

## 2017-09-15 LAB — IRON AND TIBC
IRON: 78 ug/dL (ref 42–163)
SATURATION RATIOS: 26 % — AB (ref 42–163)
TIBC: 295 ug/dL (ref 202–409)
UIBC: 217 ug/dL

## 2017-09-15 IMAGING — CT CT ABD-PELV W/O CM
2 of 4 series · 14 of 36 positions shown, 17 images · non-contrast
Comparison: CT [DATE], PET-CT [DATE]

CLINICAL DATA: Gastric carcinoma. Post partial gastrectomy. Omental
infarction noted on prior exams.

EXAM:
CT CHEST, ABDOMEN AND PELVIS WITHOUT CONTRAST
TECHNIQUE: Multidetector CT imaging of the chest, abdomen and pelvis was
performed following the standard protocol without IV contrast.

[Series 2: cap w/o · axial · non-contrast · 0.98mm/px · z∈[-497,+73]mm · 11 of 138 slices shown, 14 images]
[im 12/138  mediastinal]
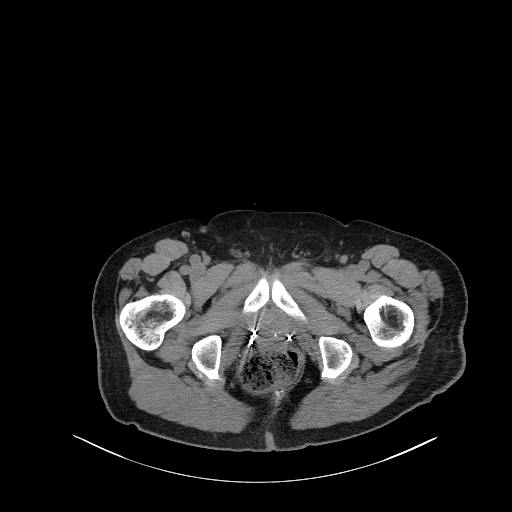
[im 12/138  lung]
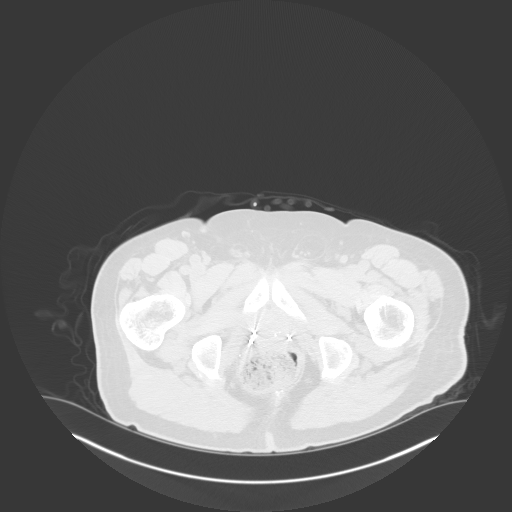
[im 23/138  lung]
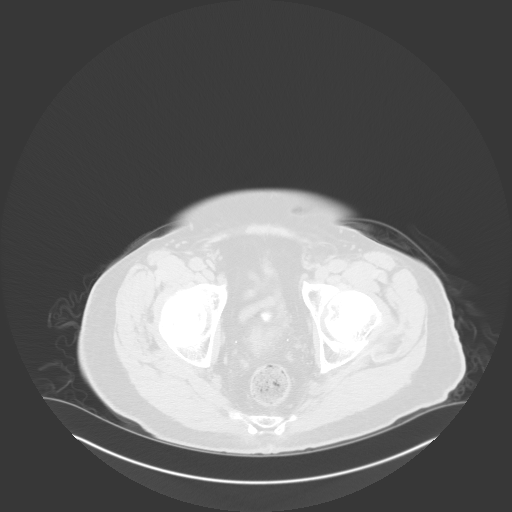
[im 35/138  lung]
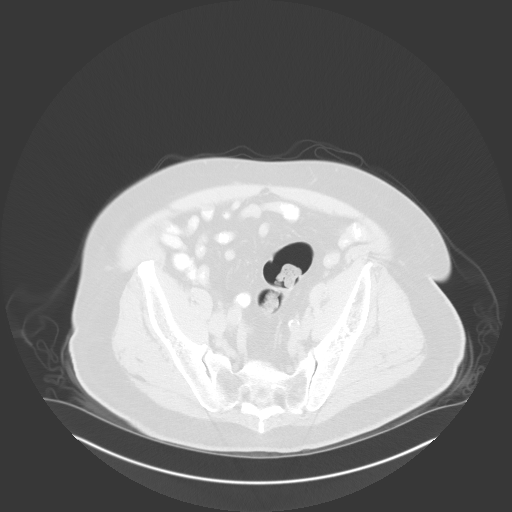
[im 46/138  lung]
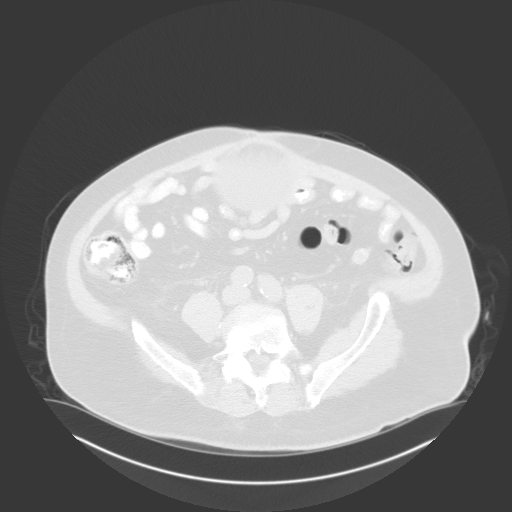
[im 58/138  mediastinal]
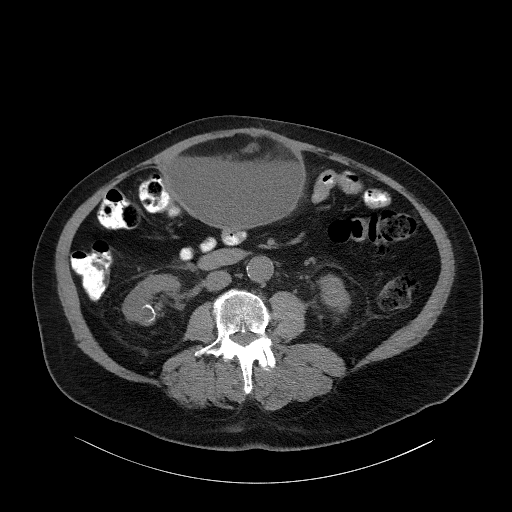
[im 58/138  lung]
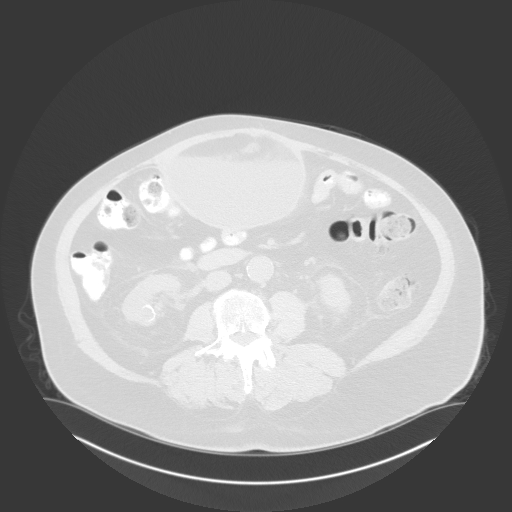
[im 69/138  lung]
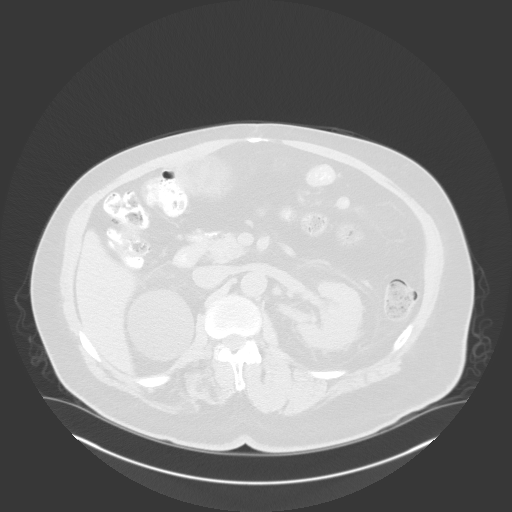
[im 80/138  lung]
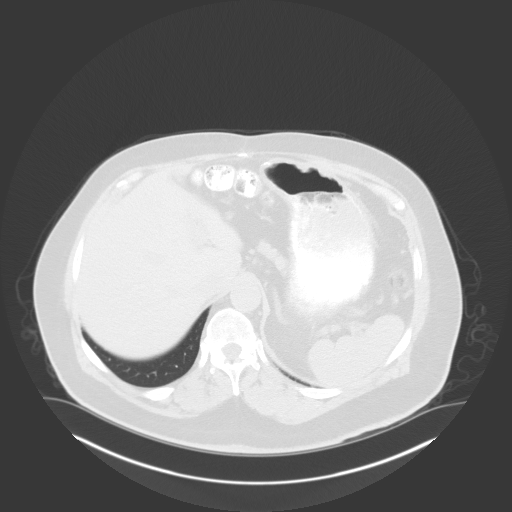
[im 92/138  lung]
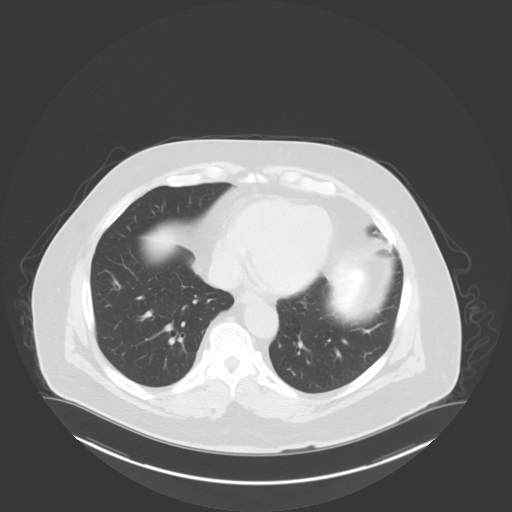
[im 103/138  mediastinal]
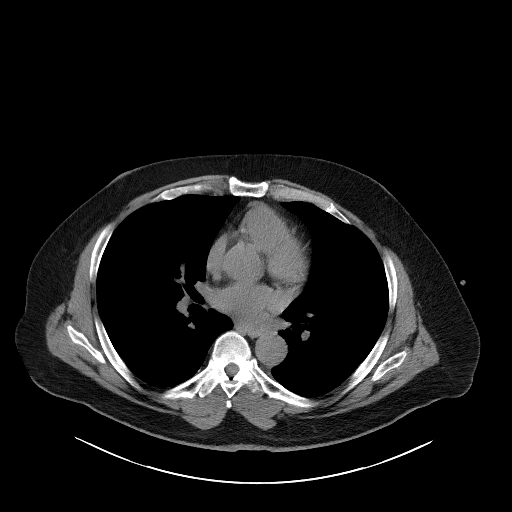
[im 103/138  lung]
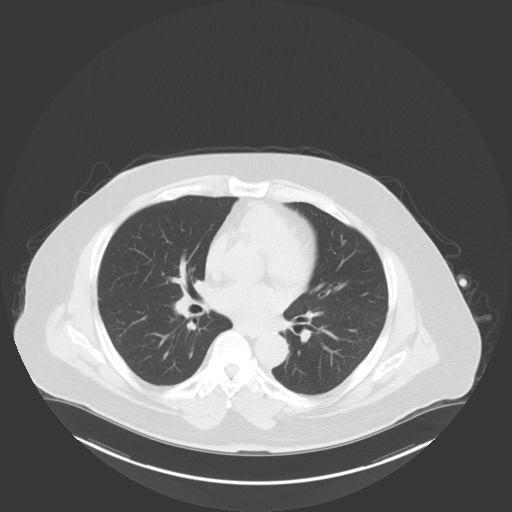
[im 115/138  lung]
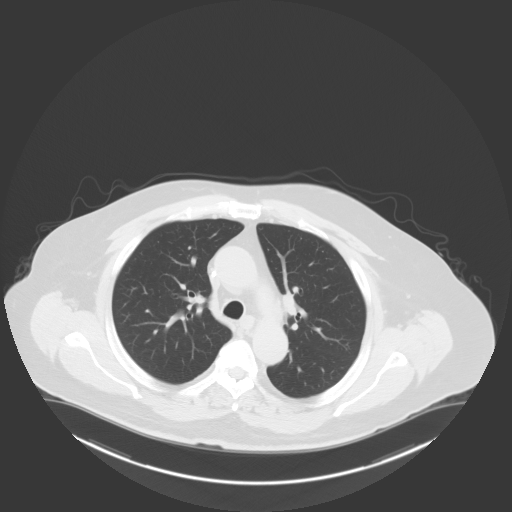
[im 126/138  lung]
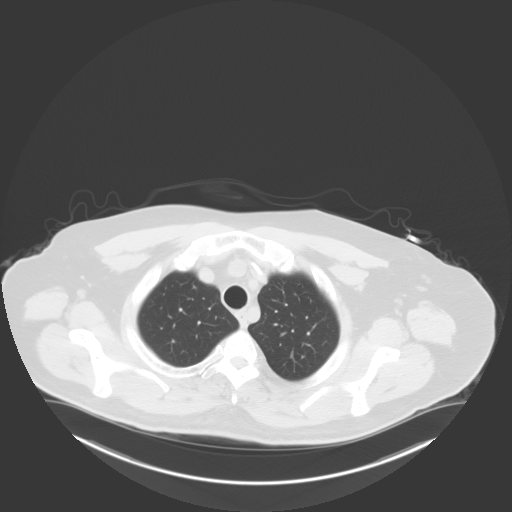

[Series 4: coronals · coronal · 1.06mm/px · 3 of 160 slices shown]
[im 32/160  lung]
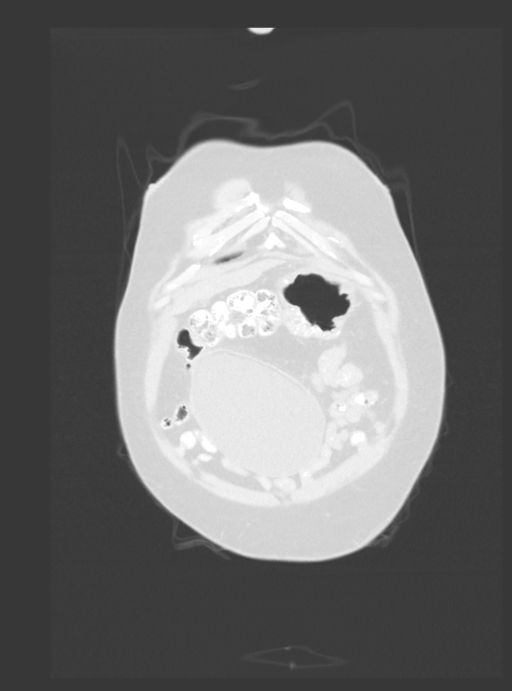
[im 64/160  lung]
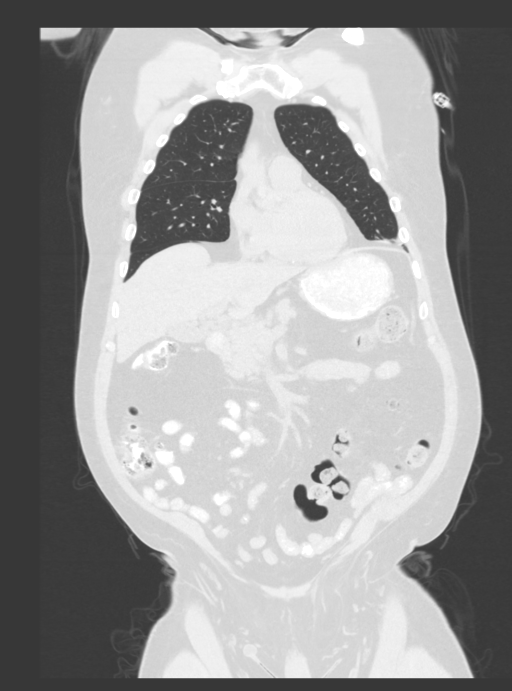
[im 96/160  lung]
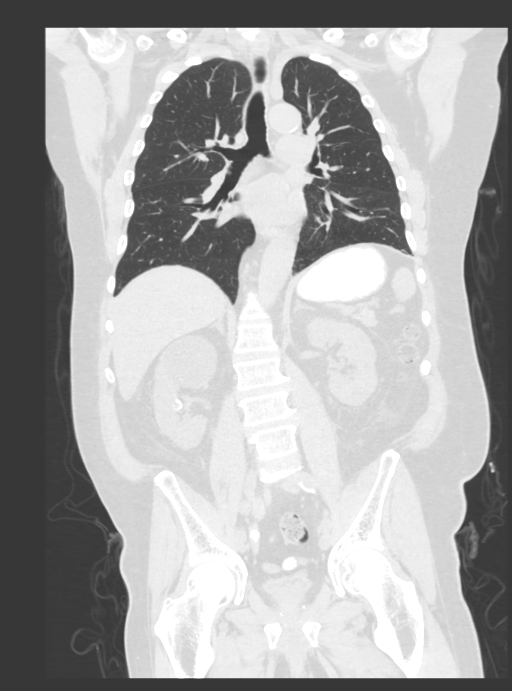

[14 of 36 positions shown; findings below may reference images not displayed]

FINDINGS: CT CHEST FINDINGS

Cardiovascular: Port in the anterior chest wall with tip in distal
SVC.. Coronary artery calcification and aortic atherosclerotic
calcification.

Mediastinum/Nodes: No axillary supraclavicular adenopathy. No
mediastinal hilar adenopathy. No pericardial effusion.

Lungs/Pleura: No suspicious pulmonary nodules.

Musculoskeletal: Within the medial LEFT chest wall 15 mm rounded
nodules unchanged significantly from prior. No aggressive osseous
lesion.

CT ABDOMEN AND PELVIS FINDINGS

Hepatobiliary: No focal hepatic lesion on noncontrast exam.
Gallbladder normal.

Pancreas: Pancreas is normal. No ductal dilatation. No pancreatic
inflammation.

Spleen: Normal spleen

Adrenals/urinary tract: Adrenal glands normal. Large simple fluid
attenuation cyst of the RIGHT kidney. Dystrophic calcification upper
pole RIGHT kidney additionally. No change from prior. Ureters and
bladder normal.

Stomach/Bowel: Stomach is distended by oral contrast. Partial
gastrectomy anatomy with gastro intact or ache anastomosis noted. No
mass lesion is stomach. The small bowel normal. Appendix and cecum
normal. Colon rectosigmoid colon normal.

Vascular/Lymphatic: No upper abdominal adenopathy. Aorta normal
caliber. No pelvic adenopathy.

Reproductive: Prostate small with brachytherapy seeds.

Other: Large ovoid central mass in the ventral peritoneal space
measuring 13 mm by 10 cm not changed from comparison exam and
consistent with large omental infarction.

Musculoskeletal: No aggressive osseous lesion.
IMPRESSION: Chest Impression:

1. No evidence of metastasis in thorax.
2. Stable small nodule in the medial LEFT chest wall.

Abdomen / Pelvis Impression:

1. No evidence of gastric carcinoma recurrence.
2. Post partial gastrectomy without complication.
3. Large central omental infarction not changed from comparison
exam.

## 2017-09-15 MED ORDER — HEPARIN SOD (PORK) LOCK FLUSH 100 UNIT/ML IV SOLN: 500.0000 [IU] | Freq: Once | INTRAVENOUS | Status: DC

## 2017-09-15 MED ORDER — SODIUM CHLORIDE 0.9% FLUSH
10.0000 mL | INTRAVENOUS | Status: DC | PRN
  Administered 2017-09-15: 10 mL via INTRAVENOUS
  Filled 2017-09-15: qty 10

## 2017-09-15 MED ORDER — HEPARIN SOD (PORK) LOCK FLUSH 100 UNIT/ML IV SOLN
INTRAVENOUS | Status: AC
  Administered 2017-09-15: 500 [IU]
  Filled 2017-09-15: qty 5

## 2017-09-15 MED ORDER — HEPARIN SOD (PORK) LOCK FLUSH 100 UNIT/ML IV SOLN
500.0000 [IU] | Freq: Once | INTRAVENOUS | Status: AC
  Administered 2017-09-15: 500 [IU] via INTRAVENOUS
  Filled 2017-09-15: qty 5

## 2017-09-16 LAB — KAPPA/LAMBDA LIGHT CHAINS
KAPPA FREE LGHT CHN: 134.7 mg/L — AB (ref 3.3–19.4)
Kappa, lambda light chain ratio: 3.52 — ABNORMAL HIGH (ref 0.26–1.65)
LAMDA FREE LIGHT CHAINS: 38.3 mg/L — AB (ref 5.7–26.3)

## 2017-09-16 LAB — MULTIPLE MYELOMA PANEL, SERUM
ALPHA 1: 0.2 g/dL (ref 0.0–0.4)
ALPHA2 GLOB SERPL ELPH-MCNC: 0.8 g/dL (ref 0.4–1.0)
Albumin SerPl Elph-Mcnc: 3.4 g/dL (ref 2.9–4.4)
Albumin/Glob SerPl: 1.1 (ref 0.7–1.7)
B-GLOBULIN SERPL ELPH-MCNC: 1.1 g/dL (ref 0.7–1.3)
Gamma Glob SerPl Elph-Mcnc: 1.1 g/dL (ref 0.4–1.8)
Globulin, Total: 3.3 g/dL (ref 2.2–3.9)
IGG (IMMUNOGLOBIN G), SERUM: 1181 mg/dL (ref 700–1600)
IgA: 414 mg/dL (ref 61–437)
IgM (Immunoglobulin M), Srm: 43 mg/dL (ref 15–143)
M PROTEIN SERPL ELPH-MCNC: 0.3 g/dL — AB
TOTAL PROTEIN ELP: 6.7 g/dL (ref 6.0–8.5)

## 2017-09-22 ENCOUNTER — Other Ambulatory Visit: Payer: Self-pay | Admitting: *Deleted

## 2017-09-22 ENCOUNTER — Telehealth: Payer: Self-pay | Admitting: Oncology

## 2017-09-22 ENCOUNTER — Inpatient Hospital Stay (HOSPITAL_BASED_OUTPATIENT_CLINIC_OR_DEPARTMENT_OTHER): Payer: Medicare Other | Admitting: Oncology

## 2017-09-22 VITALS — BP 144/81 | HR 68 | Temp 98.3°F | Resp 18 | Ht 75.0 in | Wt 243.9 lb

## 2017-09-22 DIAGNOSIS — N189 Chronic kidney disease, unspecified: Secondary | ICD-10-CM

## 2017-09-22 DIAGNOSIS — D631 Anemia in chronic kidney disease: Secondary | ICD-10-CM | POA: Diagnosis not present

## 2017-09-22 DIAGNOSIS — Z85028 Personal history of other malignant neoplasm of stomach: Secondary | ICD-10-CM

## 2017-09-22 DIAGNOSIS — C169 Malignant neoplasm of stomach, unspecified: Secondary | ICD-10-CM

## 2017-09-22 DIAGNOSIS — D472 Monoclonal gammopathy: Secondary | ICD-10-CM

## 2017-09-22 DIAGNOSIS — Z8546 Personal history of malignant neoplasm of prostate: Secondary | ICD-10-CM

## 2017-09-22 DIAGNOSIS — Z923 Personal history of irradiation: Secondary | ICD-10-CM

## 2017-09-22 DIAGNOSIS — Z79899 Other long term (current) drug therapy: Secondary | ICD-10-CM

## 2017-09-22 DIAGNOSIS — Z9221 Personal history of antineoplastic chemotherapy: Secondary | ICD-10-CM

## 2017-09-22 DIAGNOSIS — Z9889 Other specified postprocedural states: Secondary | ICD-10-CM

## 2017-09-22 MED ORDER — PROCHLORPERAZINE MALEATE 10 MG PO TABS: 10.0000 mg | ORAL_TABLET | Freq: Four times a day (QID) | ORAL | 0 refills | Status: DC | PRN

## 2017-09-22 NOTE — Telephone Encounter (Signed)
Scheduled appt per 9/10 los - gave patient AVS and calender per los.   

## 2017-09-22 NOTE — Progress Notes (Signed)
Hematology and Oncology Follow Up Visit  Jacob [name] [id number] July 15, 1945 72 y.o. 09/22/2017 10:26 AM Jacob [name], MDAvva, Ravisankar, [name]   Principle Diagnosis:  72 year old with:     1.  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  He is disease-free after surgical resection.  2.  Anemia: Related to chronic renal insufficiency and chronic disease.  3.  T1c prostate cancer in 2013 with a Gleason score 3+4 = 7 and a PSA 5.66 any evidence of recurrent disease.  4.  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   Prior Therapy:  He is status post radiation therapy for definitive treatment for his prostate cancer. Therapy concluded in December 2013. He is status post packed red cell transfusions in April 2017. He is status post bone marrow biopsy in April 2017. Results did not show any plasma cell disorder or myelodysplasia. He is status post IV iron infusion completed in April 2017. This was repeated in September 2017. FOLFOX chemotherapy with cycle 1 to be given on 12/12/2015.  He is S/P cycle 5 of therapy given on 02/20/2016. He is status post distal gastrectomy and Billroth II reconstruction completed on 03/25/2016. The final pathology revealed a T3 N0 residual tumor without any lymphadenopathy involved.  Current therapy: Active surveillance.   Interim History:  [name] is here for a follow-up visit.  He reports no major changes since the last visit.  He continues to ambulate without any difficulties without any changes in his GI habits.  He denies any abdominal distention or early satiety.  Has resumed most activities of daily living.  He denies any falls or changes in his mentation.  His appetite remain excellent and has maintained all his weight.  He does not report any headaches, blurry vision, syncope or seizures.  He denies any dizziness or confusion.  He does not report any fevers, chills, sweats or weight loss. Does not  report any chest pain, palpitation, orthopnea or leg edema. He does not report any cough, wheezing or hemoptysis.  He denies any change in his bowel habits.  He denies any hematochezia or melena.  He does not report any frequency, urgency or hesitancy. He does not report any bone pain or pathological fractures.  He reports no anxiety or depression.  He denies any petechiae or ecchymosis.  Remaining review of systems is negative.  Medications: I have reviewed the patient's current medications.  Current Outpatient Medications  Medication Sig Dispense Refill  . acetaminophen (TYLENOL) 500 MG tablet Take 1,000 mg by mouth daily as needed for moderate pain or headache.    . allopurinol (ZYLOPRIM) 300 MG tablet Take 300 mg by mouth daily.     Marland Kitchen buPROPion (WELLBUTRIN XL) 300 MG 24 hr tablet Take 300 mg by mouth daily.     . calcium carbonate (TUMS - DOSED IN MG ELEMENTAL CALCIUM) 500 MG chewable tablet Chew 3 tablets by mouth 2 (two) times daily as needed for indigestion or heartburn.    . diazepam (VALIUM) 5 MG tablet Take 5 mg by mouth at bedtime as needed (sleep).     . Empagliflozin (JARDIANCE PO) Take by mouth daily.    . furosemide (LASIX) 40 MG tablet Take 40 mg by mouth every other day.    . furosemide (LASIX) 80 MG tablet     . gabapentin (NEURONTIN) 800 MG tablet Take 800 mg by mouth at bedtime.     . lidocaine-prilocaine (EMLA) cream Apply 1 application topically as  needed. Apply to port before chemotherapy. 30 g 0  . Melatonin 10 MG TABS Take 1 tablet by mouth at bedtime as needed (sleep).     . metoCLOPramide (REGLAN) 5 MG/5ML solution Take 10 mLs (10 mg total) by mouth 4 (four) times daily -  before meals and at bedtime. 120 mL 0  . Nebivolol HCl (BYSTOLIC) 20 MG TABS Take 20 mg by mouth daily.     . Nutritional Supplements (ENSURE ENLIVE PO) Take 237 mLs by mouth 2 (two) times daily.     . ondansetron (ZOFRAN-ODT) 4 MG disintegrating tablet Take 1 tablet (4 mg total) by mouth every 6 (six)  hours as needed for nausea. 20 tablet 3  . pantoprazole (PROTONIX) 40 MG tablet Take 1 tablet (40 mg total) by mouth daily. 30 tablet 3  . polycarbophil (FIBERCON) 625 MG tablet Take 1 tablet (625 mg total) by mouth daily. 30 tablet 3  . prochlorperazine (COMPAZINE) 10 MG tablet Take 1 tablet (10 mg total) by mouth every 6 (six) hours as needed for nausea or vomiting. 30 tablet 0  . tamsulosin (FLOMAX) 0.4 MG CAPS capsule TAKE 1 CAPSULE BY MOUTH DAILY 30 capsule 2  . traZODone (DESYREL) 50 MG tablet Take 50 mg by mouth at bedtime.     . Verapamil HCl CR 300 MG CP24 Take 300 mg by mouth at bedtime.     . Water For Irrigation, Sterile (FREE WATER) SOLN Place 100 mLs into feeding tube every 4 (four) hours. 1000 mL 0   Current Facility-Administered Medications  Medication Dose Route Frequency Provider Last Rate Last Dose  . 0.9 %  sodium chloride infusion  500 mL Intravenous Once Ladene Artist, [name]         Allergies:  No Known Allergies  Past Medical History, Surgical history, Social history, and Family History updated without any changes..   Physical Exam: Blood pressure (!) 144/81, pulse 68, temperature 98.3 F (36.8 C), temperature source Oral, resp. rate 18, height 6' 3"  (1.905 m), weight 243 lb 14.4 oz (110.6 kg), SpO2 96 %.    ECOG: 1    General appearance: Comfortable appearing without any discomfort Head: Normocephalic without any trauma Oropharynx: Mucous membranes are moist and pink without any thrush or ulcers. Eyes: Pupils are equal and round reactive to light. Lymph nodes: No cervical, supraclavicular, inguinal or axillary lymphadenopathy.   Heart:regular rate and rhythm.  S1 and S2 without leg edema. Lung: Clear without any rhonchi or wheezes.  No dullness to percussion. Abdomin: Soft, nontender, nondistended with good bowel sounds.  No hepatosplenomegaly. Musculoskeletal: No joint deformity or effusion.  Full range of motion noted. Neurological: No deficits noted  on motor, sensory and deep tendon reflex exam. Skin: No petechial rash or dryness.  Appeared moist.      Lab Results: Lab Results  Component Value Date   WBC 4.6 09/15/2017   HGB 10.4 (L) 09/15/2017   HCT 31.4 (L) 09/15/2017   MCV 97.5 09/15/2017   PLT 179 09/15/2017     Chemistry      Component Value Date/Time   NA 138 09/15/2017 1051   NA 141 01/16/2017 0924   K 4.2 09/15/2017 1051   K 4.8 01/16/2017 0924   CL 106 09/15/2017 1051   CO2 24 09/15/2017 1051   CO2 25 01/16/2017 0924   BUN 23 09/15/2017 1051   BUN 24.0 01/16/2017 0924   CREATININE 1.99 (H) 09/15/2017 1051   CREATININE 1.9 (H) 01/16/2017 1610  Component Value Date/Time   CALCIUM 9.9 09/15/2017 1051   CALCIUM 9.8 01/16/2017 0924   ALKPHOS 58 09/15/2017 1051   ALKPHOS 54 01/16/2017 0924   AST 12 (L) 09/15/2017 1051   AST 16 01/16/2017 0924   ALT <6 09/15/2017 1051   ALT 10 01/16/2017 0924   BILITOT 0.5 09/15/2017 1051   BILITOT 0.54 01/16/2017 0924     IMPRESSION: Chest Impression:  1. No evidence of metastasis in thorax. 2. Stable small nodule in the medial LEFT chest wall.  Abdomen / Pelvis Impression:  1. No evidence of gastric carcinoma recurrence. 2. Post partial gastrectomy without complication. 3. 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   Impression and Plan:  72 year old man with:    1.  T3N0 adenocarcinoma of the of the distal stomach.  He is status post neoadjuvant chemotherapy followed by surgical resection.   CT scan obtained on 09/15/2017 was personally reviewed and showed no evidence of recurrent disease.  The natural course of this disease was personally reviewed today and management options were reviewed.  I recommended continued monitoring at this time and repeat imaging studies in 8 months.  2.  Anemia: Related to chronic disease and renal insufficiency.  His hemoglobin appears adequate without any need for any growth factor support or  transfusion.   3.  IgG lambda MGUS.  09/15/2017 were personally reviewed and continues to show stable M spike of 0.3 g/dL.  4. Renal insufficiency: Unrelated to a plasma cell disorder with kidney function remains stable.  5. IV access: Cath will be flushed every 2 months.  6. Follow-up: In 8 months for evaluation and repeat imaging studies..  15 minutes was spent on today's visit.  More than 50% spent face-to-face providing counseling, education and answering questions regarding future line of care.  Zola Button, [name] 9/10/201910:26 AM

## 2017-10-30 ENCOUNTER — Ambulatory Visit: Payer: Medicare Other | Admitting: Podiatry

## 2017-10-30 ENCOUNTER — Encounter: Payer: Self-pay | Admitting: Podiatry

## 2017-10-30 DIAGNOSIS — B351 Tinea unguium: Secondary | ICD-10-CM | POA: Diagnosis not present

## 2017-10-30 DIAGNOSIS — L84 Corns and callosities: Secondary | ICD-10-CM | POA: Diagnosis not present

## 2017-10-30 DIAGNOSIS — M79675 Pain in left toe(s): Secondary | ICD-10-CM

## 2017-10-30 DIAGNOSIS — M79674 Pain in right toe(s): Secondary | ICD-10-CM | POA: Diagnosis not present

## 2017-10-30 DIAGNOSIS — G609 Hereditary and idiopathic neuropathy, unspecified: Secondary | ICD-10-CM | POA: Diagnosis not present

## 2017-11-03 NOTE — Progress Notes (Signed)
Subjective: Jacob [name] presents for preventative foot care on today. He has h/o painful mycotic toenails, calluses and neuropathy. He voices no new problems on today's visit.  Objective:  Neurovascular status unchanged b/l  Dermatological Examination: Skin with normal turgor, texture and tone b/l Toenails 1-5 b/l discolored, thick, dystrophic with subungual debris and pain with palpation to nailbeds due to thickness of nails. Hyperkeratotic lesion submetatarsal head 4 b/l and submetatarsal head 3 right foot.  Musculoskeletal: Muscle strength 5/5 to all LE muscle groups  Neurological: Sensation intact with 10 gram monofilament. Vibratory sensation intact.  Assessment: 1.  Painful mycotic toenails 2.  Calluses submet 4 b/l and submet 3 right foot 3.  Peripheral neuropathy  Plan: 1. Toenails 1-5 b/l were debrided in length and girth without iatrogenic bleeding. 2. Hyperkeratotic lesion pared with sterile chisel blade submet 4 b/l and submet 3 right foot 3. Patient to continue soft, supportive shoe gear 4. Patient to report any pedal injuries to medical professional  5. Follow up 3 months.  6. Patient to call should there be a concern in the interim.

## 2017-11-23 ENCOUNTER — Inpatient Hospital Stay: Payer: Medicare Other | Attending: Oncology

## 2017-11-23 DIAGNOSIS — N189 Chronic kidney disease, unspecified: Secondary | ICD-10-CM | POA: Insufficient documentation

## 2017-11-23 DIAGNOSIS — Z452 Encounter for adjustment and management of vascular access device: Secondary | ICD-10-CM | POA: Diagnosis present

## 2017-11-23 DIAGNOSIS — D631 Anemia in chronic kidney disease: Secondary | ICD-10-CM | POA: Insufficient documentation

## 2017-11-23 DIAGNOSIS — Z95828 Presence of other vascular implants and grafts: Secondary | ICD-10-CM | POA: Insufficient documentation

## 2017-11-23 MED ORDER — HEPARIN SOD (PORK) LOCK FLUSH 100 UNIT/ML IV SOLN
500.0000 [IU] | Freq: Once | INTRAVENOUS | Status: AC
  Administered 2017-11-23: 500 [IU]
  Filled 2017-11-23: qty 5

## 2017-11-23 MED ORDER — SODIUM CHLORIDE 0.9% FLUSH
10.0000 mL | Freq: Once | INTRAVENOUS | Status: AC
  Administered 2017-11-23: 10 mL
  Filled 2017-11-23: qty 10

## 2017-12-29 ENCOUNTER — Other Ambulatory Visit: Payer: Self-pay | Admitting: *Deleted

## 2017-12-29 MED ORDER — PROCHLORPERAZINE MALEATE 10 MG PO TABS: 10.0000 mg | ORAL_TABLET | Freq: Four times a day (QID) | ORAL | 1 refills | Status: DC | PRN

## 2018-01-22 ENCOUNTER — Inpatient Hospital Stay: Payer: Medicare Other | Attending: Oncology

## 2018-01-22 DIAGNOSIS — D631 Anemia in chronic kidney disease: Secondary | ICD-10-CM | POA: Diagnosis present

## 2018-01-22 DIAGNOSIS — Z452 Encounter for adjustment and management of vascular access device: Secondary | ICD-10-CM | POA: Insufficient documentation

## 2018-01-22 DIAGNOSIS — N189 Chronic kidney disease, unspecified: Secondary | ICD-10-CM | POA: Diagnosis present

## 2018-01-22 DIAGNOSIS — Z95828 Presence of other vascular implants and grafts: Secondary | ICD-10-CM

## 2018-01-22 MED ORDER — HEPARIN SOD (PORK) LOCK FLUSH 100 UNIT/ML IV SOLN
500.0000 [IU] | Freq: Once | INTRAVENOUS | Status: AC
  Administered 2018-01-22: 500 [IU]
  Filled 2018-01-22: qty 5

## 2018-01-22 MED ORDER — SODIUM CHLORIDE 0.9% FLUSH
10.0000 mL | Freq: Once | INTRAVENOUS | Status: AC
  Administered 2018-01-22: 10 mL
  Filled 2018-01-22: qty 10

## 2018-01-29 ENCOUNTER — Encounter: Payer: Self-pay | Admitting: Podiatry

## 2018-01-29 ENCOUNTER — Ambulatory Visit: Payer: Medicare Other | Admitting: Podiatry

## 2018-01-29 DIAGNOSIS — M79675 Pain in left toe(s): Secondary | ICD-10-CM | POA: Diagnosis not present

## 2018-01-29 DIAGNOSIS — M79674 Pain in right toe(s): Secondary | ICD-10-CM

## 2018-01-29 DIAGNOSIS — B351 Tinea unguium: Secondary | ICD-10-CM

## 2018-01-29 DIAGNOSIS — L84 Corns and callosities: Secondary | ICD-10-CM

## 2018-01-29 DIAGNOSIS — G609 Hereditary and idiopathic neuropathy, unspecified: Secondary | ICD-10-CM

## 2018-01-29 NOTE — Patient Instructions (Signed)

## 2018-02-14 ENCOUNTER — Encounter: Payer: Self-pay | Admitting: Podiatry

## 2018-02-14 NOTE — Progress Notes (Signed)
Subjective: Jacob [name] presents today with history of neuropathy with cc of painful, mycotic toenails.  Pain is aggravated when wearing enclosed shoe gear and relieved with periodic professional debridement.  Patient has peripheral neuropathy managed with gabapentin.  Prince Solian, MD is his PCP.   Current Outpatient Medications:  .  acetaminophen (TYLENOL) 500 MG tablet, Take 1,000 mg by mouth daily as needed for moderate pain or headache., Disp: , Rfl:  .  allopurinol (ZYLOPRIM) 300 MG tablet, Take 300 mg by mouth daily. , Disp: , Rfl:  .  buPROPion (WELLBUTRIN XL) 300 MG 24 hr tablet, Take 300 mg by mouth daily. , Disp: , Rfl:  .  calcium carbonate (TUMS - DOSED IN MG ELEMENTAL CALCIUM) 500 MG chewable tablet, Chew 3 tablets by mouth 2 (two) times daily as needed for indigestion or heartburn., Disp: , Rfl:  .  diazepam (VALIUM) 5 MG tablet, Take 5 mg by mouth at bedtime as needed (sleep). , Disp: , Rfl:  .  Empagliflozin (JARDIANCE PO), Take by mouth daily., Disp: , Rfl:  .  furosemide (LASIX) 40 MG tablet, Take 40 mg by mouth every other day., Disp: , Rfl:  .  furosemide (LASIX) 80 MG tablet, , Disp: , Rfl:  .  gabapentin (NEURONTIN) 800 MG tablet, Take 800 mg by mouth at bedtime. , Disp: , Rfl:  .  lidocaine-prilocaine (EMLA) cream, Apply 1 application topically as needed. Apply to port before chemotherapy., Disp: 30 g, Rfl: 0 .  Melatonin 10 MG TABS, Take 1 tablet by mouth at bedtime as needed (sleep). , Disp: , Rfl:  .  metoCLOPramide (REGLAN) 5 MG/5ML solution, Take 10 mLs (10 mg total) by mouth 4 (four) times daily -  before meals and at bedtime., Disp: 120 mL, Rfl: 0 .  Nebivolol HCl (BYSTOLIC) 20 MG TABS, Take 20 mg by mouth daily. , Disp: , Rfl:  .  Nutritional Supplements (ENSURE ENLIVE PO), Take 237 mLs by mouth 2 (two) times daily. , Disp: , Rfl:  .  ondansetron (ZOFRAN-ODT) 4 MG disintegrating tablet, Take 1 tablet (4 mg total) by mouth every 6 (six) hours as  needed for nausea., Disp: 20 tablet, Rfl: 3 .  pantoprazole (PROTONIX) 40 MG tablet, Take 1 tablet (40 mg total) by mouth daily., Disp: 30 tablet, Rfl: 3 .  polycarbophil (FIBERCON) 625 MG tablet, Take 1 tablet (625 mg total) by mouth daily., Disp: 30 tablet, Rfl: 3 .  prochlorperazine (COMPAZINE) 10 MG tablet, Take 1 tablet (10 mg total) by mouth every 6 (six) hours as needed for nausea or vomiting., Disp: 30 tablet, Rfl: 1 .  tamsulosin (FLOMAX) 0.4 MG CAPS capsule, TAKE 1 CAPSULE BY MOUTH DAILY, Disp: 30 capsule, Rfl: 2 .  traZODone (DESYREL) 50 MG tablet, Take 50 mg by mouth at bedtime. , Disp: , Rfl:  .  Verapamil HCl CR 300 MG CP24, Take 300 mg by mouth at bedtime. , Disp: , Rfl:  .  Water For Irrigation, Sterile (FREE WATER) SOLN, Place 100 mLs into feeding tube every 4 (four) hours., Disp: 1000 mL, Rfl: 0  Current Facility-Administered Medications:  .  0.9 %  sodium chloride infusion, 500 mL, Intravenous, Once, Ladene Artist, MD  No Known Allergies  Objective:  Vascular status intact and unchaged b/l  Dermatological Examination: Skin with normal turgor, texture and tone b/l  Toenails 1-5 b/l discolored, thick, dystrophic with subungual debris and pain with palpation to nailbeds due to thickness of nails.  Hyperkeratotic lesion submetatarsal  head 4 b/l and submetatarsal head 4 b/l and submetatarsal head 3 right foot  Musculoskeletal: Muscle strength 5/5 to all muscle groups b/l  Neurological: Sensation with 10 gram monofilament is intact b/l Vibratory sensation intact b/l  Assessment: 1. Painful onychomycosis toenails 1-5 b/l 2. Calluses submet head 4 b/l and submet head 3 right foot 3. Peripheral neuropathy  Plan: 1. Toenails 1-5 b/l were debrided in length and girth without iatrogenic bleeding. 2. Calluses pared ssubmet head 4 b/l and submet head 3 right foot 3. Patient to continue soft, supportive shoe gear 4. Patient to report any pedal injuries to medical  professional  5. Follow up 3 months. Patient/POA to call should there be a concern in the interim.

## 2018-03-23 ENCOUNTER — Inpatient Hospital Stay: Payer: Medicare Other | Attending: Oncology

## 2018-03-23 DIAGNOSIS — Z95828 Presence of other vascular implants and grafts: Secondary | ICD-10-CM

## 2018-03-23 DIAGNOSIS — D631 Anemia in chronic kidney disease: Secondary | ICD-10-CM | POA: Insufficient documentation

## 2018-03-23 DIAGNOSIS — Z452 Encounter for adjustment and management of vascular access device: Secondary | ICD-10-CM | POA: Diagnosis present

## 2018-03-23 DIAGNOSIS — N189 Chronic kidney disease, unspecified: Secondary | ICD-10-CM | POA: Insufficient documentation

## 2018-03-23 MED ORDER — SODIUM CHLORIDE 0.9% FLUSH
10.0000 mL | Freq: Once | INTRAVENOUS | Status: AC
  Administered 2018-03-23: 10 mL
  Filled 2018-03-23: qty 10

## 2018-03-23 MED ORDER — HEPARIN SOD (PORK) LOCK FLUSH 100 UNIT/ML IV SOLN
500.0000 [IU] | Freq: Once | INTRAVENOUS | Status: AC
  Administered 2018-03-23: 500 [IU]
  Filled 2018-03-23: qty 5

## 2018-04-15 ENCOUNTER — Inpatient Hospital Stay: Payer: Medicare Other

## 2018-04-15 ENCOUNTER — Inpatient Hospital Stay: Payer: Medicare Other | Attending: Oncology

## 2018-04-15 ENCOUNTER — Ambulatory Visit (HOSPITAL_COMMUNITY)
Admission: RE | Admit: 2018-04-15 | Discharge: 2018-04-15 | Disposition: A | Discharge status: Home / Self Care | Payer: Medicare Other | Source: Ambulatory Visit | Attending: Oncology | Admitting: Oncology

## 2018-04-15 ENCOUNTER — Other Ambulatory Visit: Payer: Self-pay

## 2018-04-15 DIAGNOSIS — C169 Malignant neoplasm of stomach, unspecified: Secondary | ICD-10-CM | POA: Insufficient documentation

## 2018-04-15 LAB — CBC WITH DIFFERENTIAL (CANCER CENTER ONLY)
Abs Immature Granulocytes: 0.02 10*3/uL (ref 0.00–0.07)
Basophils Absolute: 0 10*3/uL (ref 0.0–0.1)
Basophils Relative: 1 %
Eosinophils Absolute: 0.5 10*3/uL (ref 0.0–0.5)
Eosinophils Relative: 7 %
HCT: 30.1 % — ABNORMAL LOW (ref 39.0–52.0)
Hemoglobin: 9.8 g/dL — ABNORMAL LOW (ref 13.0–17.0)
Immature Granulocytes: 0 %
Lymphocytes Relative: 21 %
Lymphs Abs: 1.4 10*3/uL (ref 0.7–4.0)
MCH: 32.6 pg (ref 26.0–34.0)
MCHC: 32.6 g/dL (ref 30.0–36.0)
MCV: 100 fL (ref 80.0–100.0)
Monocytes Absolute: 0.5 10*3/uL (ref 0.1–1.0)
Monocytes Relative: 7 %
Neutro Abs: 4.3 10*3/uL (ref 1.7–7.7)
Neutrophils Relative %: 64 %
Platelet Count: 171 10*3/uL (ref 150–400)
RBC: 3.01 MIL/uL — ABNORMAL LOW (ref 4.22–5.81)
RDW: 15.2 % (ref 11.5–15.5)
WBC Count: 6.7 10*3/uL (ref 4.0–10.5)
nRBC: 0 % (ref 0.0–0.2)

## 2018-04-15 LAB — CMP (CANCER CENTER ONLY)
ALT: 13 U/L (ref 0–44)
AST: 18 U/L (ref 15–41)
Albumin: 3.3 g/dL — ABNORMAL LOW (ref 3.5–5.0)
Alkaline Phosphatase: 57 U/L (ref 38–126)
Anion gap: 10 (ref 5–15)
BUN: 38 mg/dL — ABNORMAL HIGH (ref 8–23)
CO2: 19 mmol/L — ABNORMAL LOW (ref 22–32)
Calcium: 9.5 mg/dL (ref 8.9–10.3)
Chloride: 110 mmol/L (ref 98–111)
Creatinine: 2.46 mg/dL — ABNORMAL HIGH (ref 0.61–1.24)
GFR, Est AFR Am: 29 mL/min — ABNORMAL LOW (ref >60)
GFR, Estimated: 25 mL/min — ABNORMAL LOW (ref >60)
Glucose, Bld: 143 mg/dL — ABNORMAL HIGH (ref 70–99)
Potassium: 4.7 mmol/L (ref 3.5–5.1)
Sodium: 139 mmol/L (ref 135–145)
Total Bilirubin: 0.4 mg/dL (ref 0.3–1.2)
Total Protein: 6.9 g/dL (ref 6.5–8.1)

## 2018-04-15 IMAGING — CT CT CHEST WITHOUT CONTRAST
2 of 4 series · 13 of 36 positions shown, 16 images · non-contrast
Comparison: Multiple exams, including [DATE]

CLINICAL DATA: Restaging gastric cancer, prior gastric surgery,
chemotherapy completed [DATE]. Remote prostate cancer.
Chronic cough and nausea.

EXAM:
CT CHEST, ABDOMEN AND PELVIS WITHOUT CONTRAST
TECHNIQUE: Multidetector CT imaging of the chest, abdomen and pelvis was
performed following the standard protocol without IV contrast.

[Series 2: cap w/o · axial · non-contrast · 0.93mm/px · z∈[-630,-30]mm · 10 of 145 slices shown, 13 images]
[im 13/145  mediastinal]
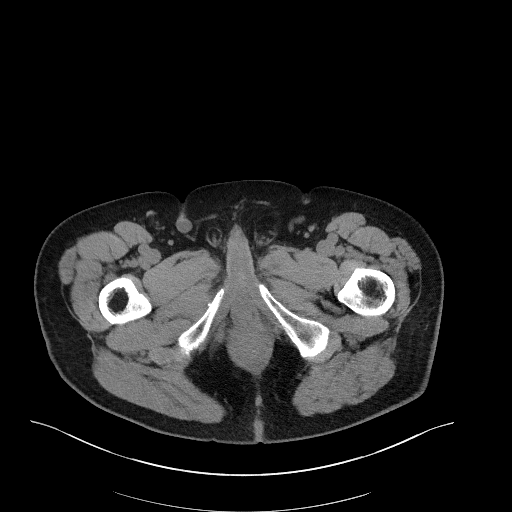
[im 13/145  lung]
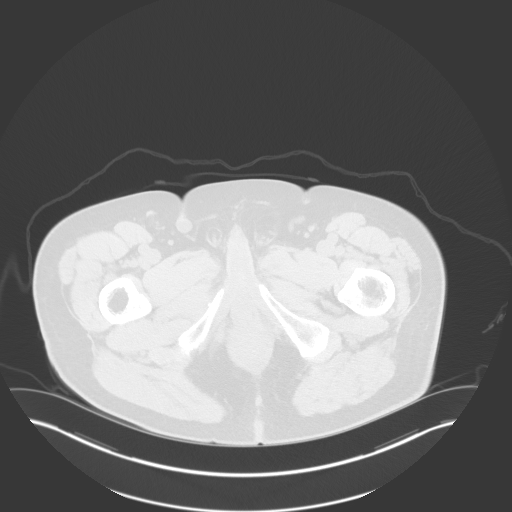
[im 25/145  lung]
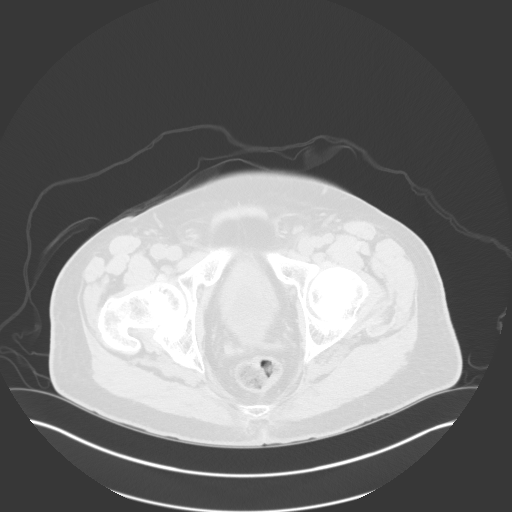
[im 37/145  lung]
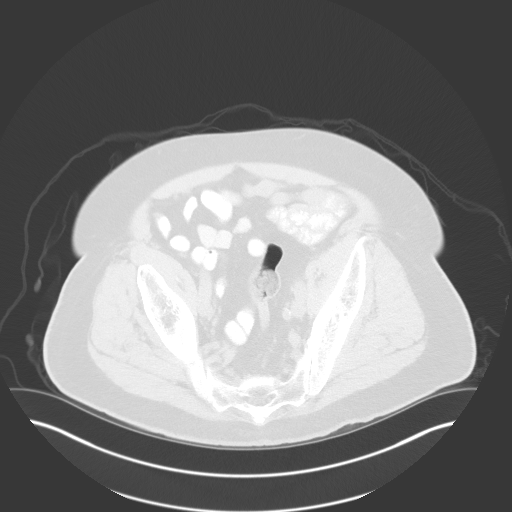
[im 49/145  lung]
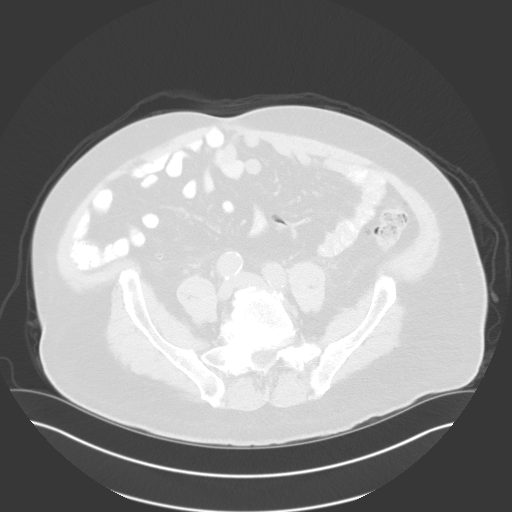
[im 61/145  mediastinal]
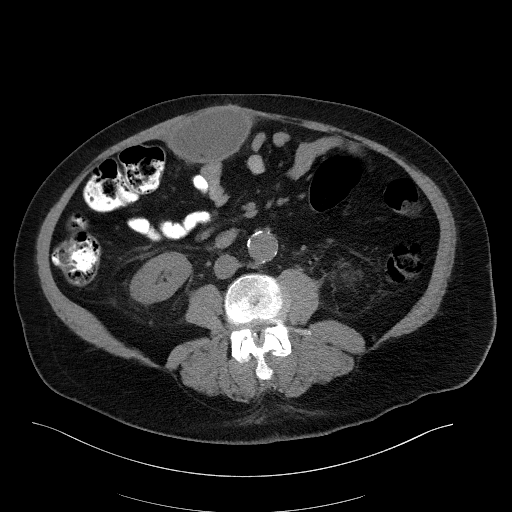
[im 61/145  lung]
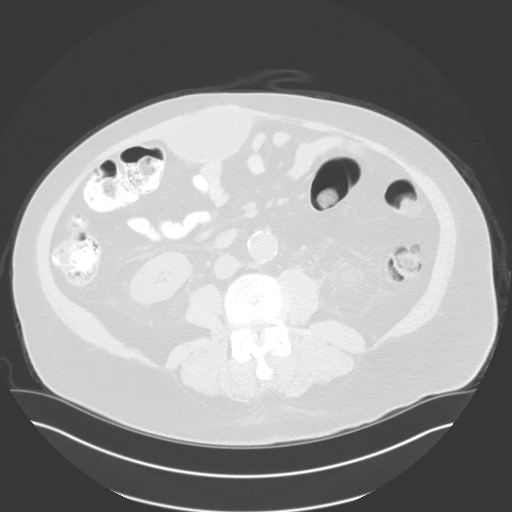
[im 85/145  lung]
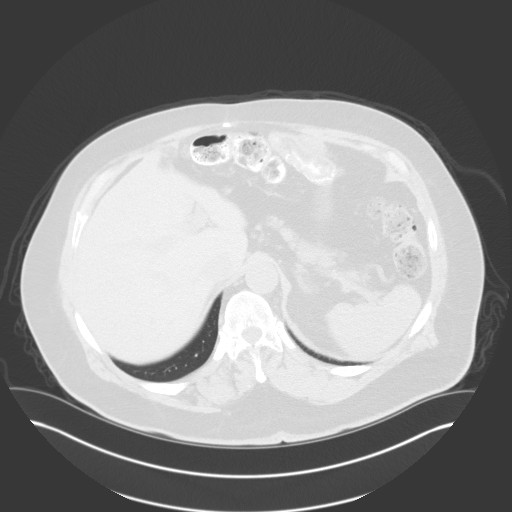
[im 97/145  lung]
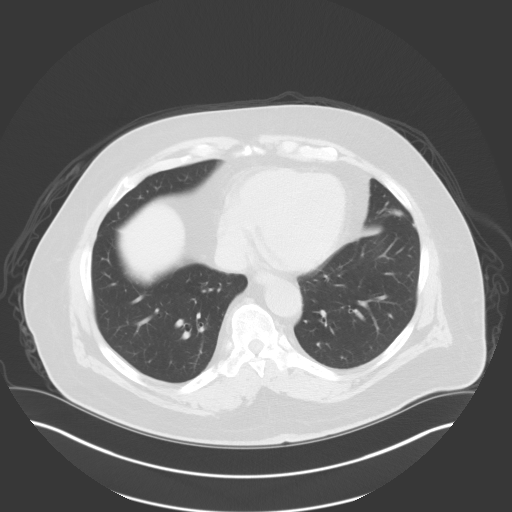
[im 109/145  lung]
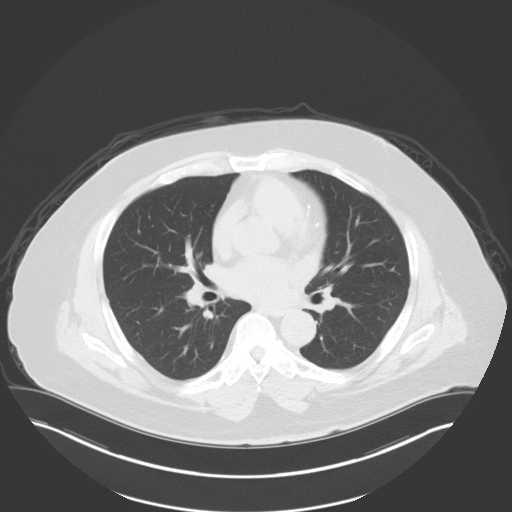
[im 121/145  mediastinal]
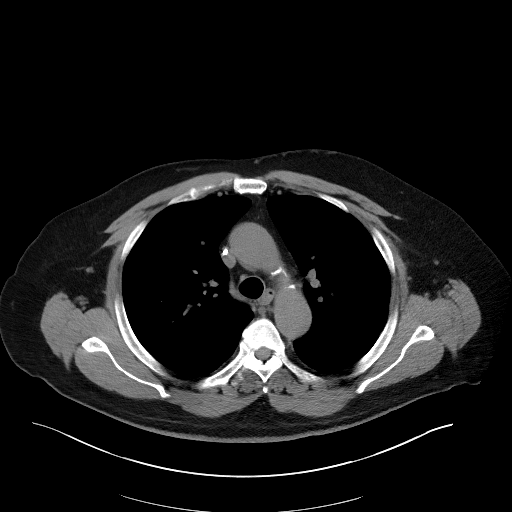
[im 121/145  lung]
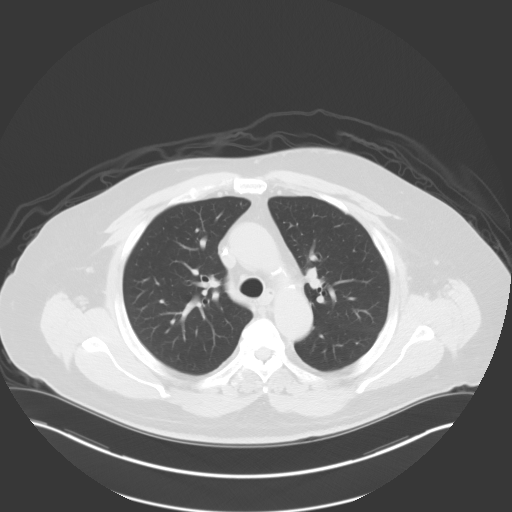
[im 133/145  lung]
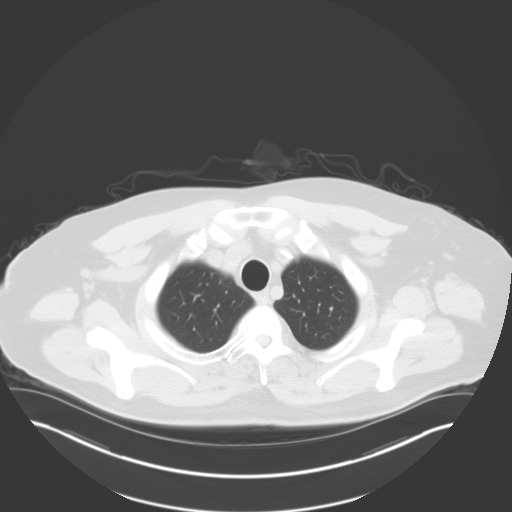

[Series 5: coronals · coronal · 0.86mm/px · 3 of 190 slices shown]
[im 38/190  lung]
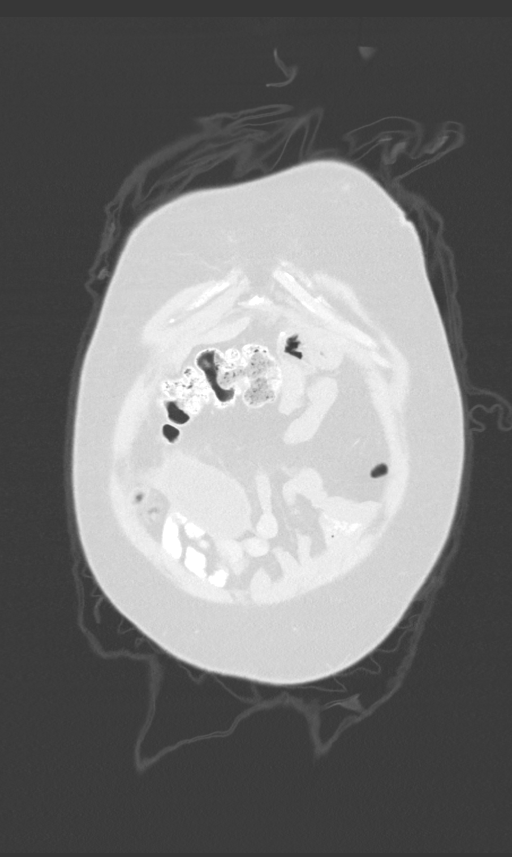
[im 76/190  lung]
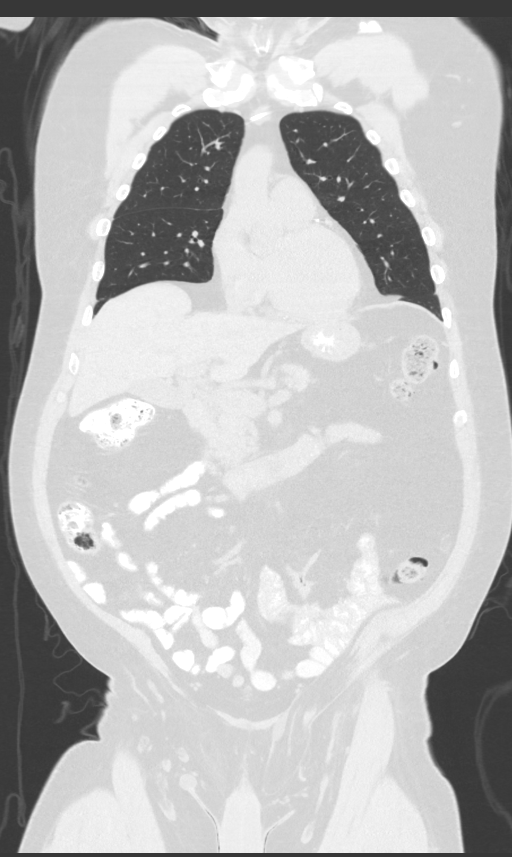
[im 114/190  lung]
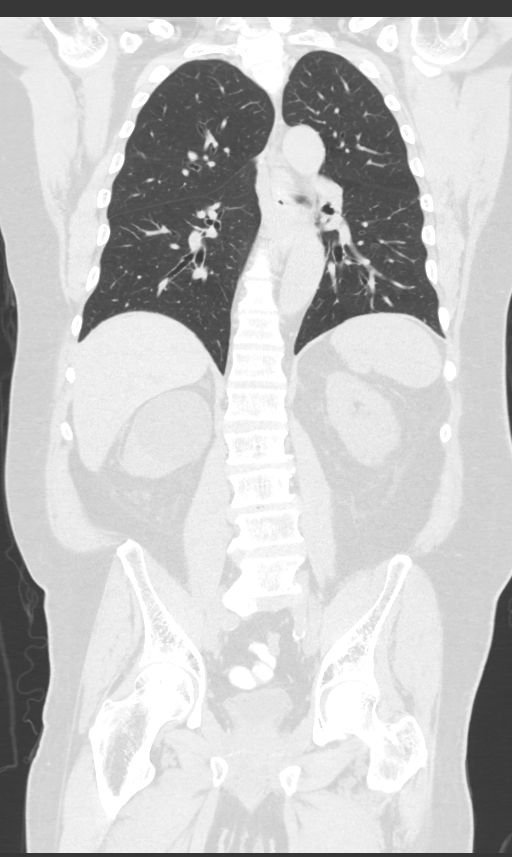

[13 of 36 positions shown; findings below may reference images not displayed]

FINDINGS: CT CHEST FINDINGS

Cardiovascular: Left Port-A-Cath tip: SVC. Aortic arch and left
anterior descending coronary artery atherosclerotic calcification.
Mild mitral calcification.

Mediastinum/Nodes: Stable nodule medially in the left breast
subcutaneous tissues measuring 1.5 by 1.1 cm, image [DATE].

Lungs/Pleura: 3 mm right upper lobe nodule on image 46/4, stable.

Musculoskeletal: Dextroconvex upper thoracic scoliosis.

CT ABDOMEN PELVIS FINDINGS

Hepatobiliary: Unremarkable

Pancreas: Unremarkable

Spleen: Unremarkable

Adrenals/Urinary Tract: Fluid density exophytic 6.8 cm right kidney
upper pole cystic lesion. Stable rim calcified right mid kidney
lesion measuring 1.6 cm in diameter on image 81/2. Small exophytic
hypodense lesion of the right kidney lower pole is likely a cyst. No
urinary tract calculi are identified.

Stomach/Bowel: Partial gastrectomy with gastrojejunostomy. Otherwise
unremarkable.

Vascular/Lymphatic: Aortoiliac atherosclerotic vascular disease.

Reproductive: Fiducials along the posterolateral margin of the
prostate gland. Mild prostatomegaly, stable.

Other: Omental collection of fluid and adipose tissue likely from a
prior omental infarct, currently measuring 8.4 by 4.4 by 8.0 cm
(volume = 150 cm^3), previously measuring 13.4 by 10.2 by 12.1 cm
(volume = 866 cm^3). There is also a small focus of fat necrosis
along the left lower quadrant omentum on image 92/2, stable.

Musculoskeletal: Lumbar spondylosis and degenerative disc disease
causing impingement at all levels between L2 and S1. Degenerative
endplate findings at L5-S1. No appreciable findings of osseous
metastatic disease.
IMPRESSION: 1. No findings of recurrent or active malignancy. Today's exam was
performed without IV contrast, which can reduce sensitivity for
solid organ lesions.
2. Evolutionary findings and by prominent reduction in volume of the
prior omental infarct, currently less than [DATE] of its previous
volume.
3. Other imaging findings of potential clinical significance: Aortic
Atherosclerosis ([A1]-[A1]). Coronary atherosclerosis. Mild mitral
valve calcification. Mild scoliosis. Multilevel lumbar impingement.
Mild prostatomegaly with fiducials along the margins of the prostate
gland. Prior partial gastrectomy with patent gastrojejunostomy.

## 2018-04-15 MED ORDER — HEPARIN SOD (PORK) LOCK FLUSH 100 UNIT/ML IV SOLN
500.0000 [IU] | Freq: Once | INTRAVENOUS | Status: AC
  Administered 2018-04-15: 500 [IU] via INTRAVENOUS

## 2018-04-15 MED ORDER — HEPARIN SOD (PORK) LOCK FLUSH 100 UNIT/ML IV SOLN
INTRAVENOUS | Status: AC
  Administered 2018-04-15: 500 [IU] via INTRAVENOUS
  Filled 2018-04-15: qty 5

## 2018-04-22 ENCOUNTER — Inpatient Hospital Stay (HOSPITAL_BASED_OUTPATIENT_CLINIC_OR_DEPARTMENT_OTHER): Payer: Medicare Other | Admitting: Oncology

## 2018-04-22 DIAGNOSIS — D631 Anemia in chronic kidney disease: Secondary | ICD-10-CM | POA: Diagnosis not present

## 2018-04-22 DIAGNOSIS — N189 Chronic kidney disease, unspecified: Secondary | ICD-10-CM

## 2018-04-22 DIAGNOSIS — C169 Malignant neoplasm of stomach, unspecified: Secondary | ICD-10-CM

## 2018-04-22 DIAGNOSIS — D472 Monoclonal gammopathy: Secondary | ICD-10-CM

## 2018-04-22 NOTE — Progress Notes (Signed)
Hematology and Oncology Follow Up for Telemedicine Visits  Jacob [name] [id number] Nov 21, 1945 73 y.o. 04/22/2018 9:31 AM [name], [name], [name], MD   I connected Jacob [name] on 04/22/18 at  1:30 PM EDT by telephone visit and verified that I am speaking with the correct person using two identifiers.   I discussed the limitations, risks, security and privacy concerns of performing an evaluation and management service by telemedicine and the availability of in-person appointments. I also discussed with the patient that there may be a patient responsible charge related to this service. The patient expressed understanding and agreed to proceed.  Other persons participating in the visit and their role in the encounter:  None.  Patient's location:  Home.  Provider's location:  Office.   Chief Complaint: No complaints today.  Principle Diagnosis:   73 year old with:     1.   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   2.  Anemia: Due to renal insufficiency.  3.  Localized prostate cancer diagnosed in 2013.  He was found to have T1c   4.  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     Prior Therapy: He is status post radiation therapy for definitive treatment for his prostate cancer. Therapy concluded in December 2013. He is status post packed red cell transfusions in April 2017. He is status post bone marrow biopsy in April 2017. Results did not show any plasma cell disorder or myelodysplasia. He is status post IV iron infusion completed in April 2017. This was repeated in September 2017. FOLFOX chemotherapy with cycle 1 to be given on 12/12/2015.  He is S/P cycle 5 of therapy given on 02/20/2016. He is status post distal gastrectomy and Billroth II reconstruction completed on 03/25/2016. The final pathology revealed a T3 N0 residual tumor without any lymphadenopathy involved.   Current  therapy: Active surveillance.  Interim History: Jacob [name] reports no major issues or complaints at this time.  He remains ambulatory without any recent falls or syncope.  He denies any residual complications related to chemotherapy.  He denies any recent hospitalization or illnesses.  Denies abdominal pain or discomfort.   Medications: I have reviewed the patient's current medications.  Current Outpatient Medications  Medication Sig Dispense Refill  . acetaminophen (TYLENOL) 500 MG tablet Take 1,000 mg by mouth daily as needed for moderate pain or headache.    . allopurinol (ZYLOPRIM) 300 MG tablet Take 300 mg by mouth daily.     Marland Kitchen buPROPion (WELLBUTRIN XL) 300 MG 24 hr tablet Take 300 mg by mouth daily.     . calcium carbonate (TUMS - DOSED IN MG ELEMENTAL CALCIUM) 500 MG chewable tablet Chew 3 tablets by mouth 2 (two) times daily as needed for indigestion or heartburn.    . diazepam (VALIUM) 5 MG tablet Take 5 mg by mouth at bedtime as needed (sleep).     . Empagliflozin (JARDIANCE PO) Take by mouth daily.    . furosemide (LASIX) 40 MG tablet Take 40 mg by mouth every other day.    . furosemide (LASIX) 80 MG tablet     . gabapentin (NEURONTIN) 800 MG tablet Take 800 mg by mouth at bedtime.     . lidocaine-prilocaine (EMLA) cream Apply 1 application topically as needed. Apply to port before chemotherapy. 30 g 0  . Melatonin 10 MG TABS Take 1 tablet by mouth at bedtime as needed (sleep).     Marland Kitchen  metoCLOPramide (REGLAN) 5 MG/5ML solution Take 10 mLs (10 mg total) by mouth 4 (four) times daily -  before meals and at bedtime. 120 mL 0  . Nebivolol HCl (BYSTOLIC) 20 MG TABS Take 20 mg by mouth daily.     . Nutritional Supplements (ENSURE ENLIVE PO) Take 237 mLs by mouth 2 (two) times daily.     . ondansetron (ZOFRAN-ODT) 4 MG disintegrating tablet Take 1 tablet (4 mg total) by mouth every 6 (six) hours as needed for nausea. 20 tablet 3  . pantoprazole (PROTONIX) 40 MG tablet Take 1 tablet (40  mg total) by mouth daily. 30 tablet 3  . polycarbophil (FIBERCON) 625 MG tablet Take 1 tablet (625 mg total) by mouth daily. 30 tablet 3  . prochlorperazine (COMPAZINE) 10 MG tablet Take 1 tablet (10 mg total) by mouth every 6 (six) hours as needed for nausea or vomiting. 30 tablet 1  . tamsulosin (FLOMAX) 0.4 MG CAPS capsule TAKE 1 CAPSULE BY MOUTH DAILY 30 capsule 2  . traZODone (DESYREL) 50 MG tablet Take 50 mg by mouth at bedtime.     . Verapamil HCl CR 300 MG CP24 Take 300 mg by mouth at bedtime.     . Water For Irrigation, Sterile (FREE WATER) SOLN Place 100 mLs into feeding tube every 4 (four) hours. 1000 mL 0   Current Facility-Administered Medications  Medication Dose Route Frequency Provider Last Rate Last Dose  . 0.9 %  sodium chloride infusion  500 mL Intravenous Once Ladene Artist, MD         Allergies: No Known Allergies  Past Medical History, Surgical history, Social history, and Family History were reviewed and updated.     Lab Results: Lab Results  Component Value Date   WBC 6.7 04/15/2018   HGB 9.8 (L) 04/15/2018   HCT 30.1 (L) 04/15/2018   MCV 100.0 04/15/2018   PLT 171 04/15/2018     Chemistry      Component Value Date/Time   NA 139 04/15/2018 1158   NA 141 01/16/2017 0924   K 4.7 04/15/2018 1158   K 4.8 01/16/2017 0924   CL 110 04/15/2018 1158   CO2 19 (L) 04/15/2018 1158   CO2 25 01/16/2017 0924   BUN 38 (H) 04/15/2018 1158   BUN 24.0 01/16/2017 0924   CREATININE 2.46 (H) 04/15/2018 1158   CREATININE 1.9 (H) 01/16/2017 0924      Component Value Date/Time   CALCIUM 9.5 04/15/2018 1158   CALCIUM 9.8 01/16/2017 0924   ALKPHOS 57 04/15/2018 1158   ALKPHOS 54 01/16/2017 0924   AST 18 04/15/2018 1158   AST 16 01/16/2017 0924   ALT 13 04/15/2018 1158   ALT 10 01/16/2017 0924   BILITOT 0.4 04/15/2018 1158   BILITOT 0.54 01/16/2017 0924       Radiological Studies:  EXAM: CT CHEST, ABDOMEN AND PELVIS WITHOUT  CONTRAST  TECHNIQUE: Multidetector CT imaging of the chest, abdomen and pelvis was performed following the standard protocol without IV contrast.  COMPARISON:  Multiple exams, including 09/15/2017  FINDINGS: CT CHEST FINDINGS  Cardiovascular: Left Port-A-Cath tip: SVC. Aortic arch and left anterior descending coronary artery atherosclerotic calcification. Mild mitral calcification.  Mediastinum/Nodes: Stable nodule medially in the left breast subcutaneous tissues measuring 1.5 by 1.1 cm, image 31/2.  Lungs/Pleura: 3 mm right upper lobe nodule on image 46/4, stable.  Musculoskeletal: Dextroconvex upper thoracic scoliosis.  CT ABDOMEN PELVIS FINDINGS  Hepatobiliary: Unremarkable  Pancreas: Unremarkable  Spleen: Unremarkable  Adrenals/Urinary Tract: Fluid density exophytic 6.8 cm right kidney upper pole cystic lesion. Stable rim calcified right mid kidney lesion measuring 1.6 cm in diameter on image 81/2. Small exophytic hypodense lesion of the right kidney lower pole is likely a cyst. No urinary tract calculi are identified.  Stomach/Bowel: Partial gastrectomy with gastrojejunostomy. Otherwise unremarkable.  Vascular/Lymphatic: Aortoiliac atherosclerotic vascular disease.  Reproductive: Fiducials along the posterolateral margin of the prostate gland. Mild prostatomegaly, stable.  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 There is also a small focus of fat necrosis along the left lower quadrant omentum on image 92/2, stable.  Musculoskeletal: Lumbar spondylosis and degenerative disc disease causing impingement at all levels between L2 and S1. Degenerative endplate findings at T9-Q3. No appreciable findings of osseous metastatic disease.  IMPRESSION: 1. No findings of  recurrent or active malignancy. Today's exam was performed without IV contrast, which can reduce sensitivity for solid organ lesions. 2. Evolutionary findings and by prominent reduction in volume of the prior omental infarct, currently less than 1/5 of its previous volume. 3. Other imaging findings of potential clinical significance: Aortic Atherosclerosis (ICD10-I70.0). Coronary atherosclerosis. Mild mitral valve calcification. Mild scoliosis. Multilevel lumbar impingement. Mild prostatomegaly with fiducials along the margins of the prostate gland. Prior partial gastrectomy with patent gastrojejunostomy.   Impression and Plan:   73 year old man with:    1.   Gastric cancer diagnosed in 2017.  He presented with T3N0 adenocarcinoma of the  antrum.   He remains on active surveillance without any evidence of recurrent disease.  CT scan obtained on April 15, 2018 was personally reviewed and discussed with the patient over the phone.  Is disease appears to be in remission and I recommended continued active surveillance.  I recommend repeating imaging studies in December 2020.  2.  Anemia:  Hemoglobin remained stable and does not require any growth factor support at this time.   3.  IgG lambda MGUS.    No evidence of multiple myeloma at this time.  I will repeat protein studies with the next visit.  4. Renal insufficiency:  Creatinine continues to be close to baseline without any major changes.  5. IV access:  Port-A-Cath remains in place and will be flushed periodically.  6. Follow-up: We will be every 2 months for Port-A-Cath flush and MD follow-up in 8 months.    I discussed the assessment and treatment plan with the patient. The patient was provided an opportunity to ask questions and all were answered. The patient agreed with the plan and demonstrated an understanding of the instructions.   The patient was advised to call back or seek an in-person evaluation if the  symptoms worsen or if the condition fails to improve as anticipated.  I provided 20 minutes of non face-to-face telephone visit time during this encounter, and > 50% was spent discussing laboratory data, imaging studies and counseling regarding future plan of care.  Zola Button, MD 04/22/2018 9:31 AM

## 2018-04-23 ENCOUNTER — Telehealth: Payer: Self-pay | Admitting: Oncology

## 2018-04-23 NOTE — Telephone Encounter (Signed)
Scheduled appt 4/9 sch message.

## 2018-04-30 ENCOUNTER — Ambulatory Visit: Payer: Medicare Other | Admitting: Podiatry

## 2018-05-12 ENCOUNTER — Ambulatory Visit: Payer: Medicare Other | Admitting: Podiatry

## 2018-06-23 ENCOUNTER — Other Ambulatory Visit: Payer: Self-pay

## 2018-06-23 ENCOUNTER — Inpatient Hospital Stay: Payer: Medicare Other | Attending: Oncology

## 2018-06-23 DIAGNOSIS — D631 Anemia in chronic kidney disease: Secondary | ICD-10-CM

## 2018-06-23 DIAGNOSIS — Z452 Encounter for adjustment and management of vascular access device: Secondary | ICD-10-CM | POA: Insufficient documentation

## 2018-06-23 DIAGNOSIS — C169 Malignant neoplasm of stomach, unspecified: Secondary | ICD-10-CM | POA: Insufficient documentation

## 2018-06-23 DIAGNOSIS — N189 Chronic kidney disease, unspecified: Secondary | ICD-10-CM

## 2018-06-23 DIAGNOSIS — Z95828 Presence of other vascular implants and grafts: Secondary | ICD-10-CM

## 2018-06-23 MED ORDER — SODIUM CHLORIDE 0.9% FLUSH
10.0000 mL | Freq: Once | INTRAVENOUS | Status: AC
  Administered 2018-06-23: 10 mL
  Filled 2018-06-23: qty 10

## 2018-06-23 MED ORDER — HEPARIN SOD (PORK) LOCK FLUSH 100 UNIT/ML IV SOLN
500.0000 [IU] | Freq: Once | INTRAVENOUS | Status: AC
  Administered 2018-06-23: 500 [IU]
  Filled 2018-06-23: qty 5

## 2018-07-09 ENCOUNTER — Ambulatory Visit: Payer: Medicare Other | Admitting: Podiatry

## 2018-08-04 ENCOUNTER — Ambulatory Visit (INDEPENDENT_AMBULATORY_CARE_PROVIDER_SITE_OTHER): Payer: Medicare Other | Admitting: Podiatry

## 2018-08-04 ENCOUNTER — Other Ambulatory Visit: Payer: Self-pay

## 2018-08-04 ENCOUNTER — Encounter: Payer: Self-pay | Admitting: Podiatry

## 2018-08-04 DIAGNOSIS — G609 Hereditary and idiopathic neuropathy, unspecified: Secondary | ICD-10-CM | POA: Diagnosis not present

## 2018-08-04 DIAGNOSIS — L84 Corns and callosities: Secondary | ICD-10-CM

## 2018-08-04 DIAGNOSIS — M79674 Pain in right toe(s): Secondary | ICD-10-CM | POA: Diagnosis not present

## 2018-08-04 DIAGNOSIS — M79675 Pain in left toe(s): Secondary | ICD-10-CM | POA: Diagnosis not present

## 2018-08-04 DIAGNOSIS — B351 Tinea unguium: Secondary | ICD-10-CM

## 2018-08-04 NOTE — Patient Instructions (Signed)

## 2018-08-08 NOTE — Progress Notes (Signed)
Subjective: Jacob [name] presents today with history of neuropathy. Patient seen for follow up of chronic, painful mycotic toenails, corns and calluses which interfere with daily activities and routine tasks.  Pain is aggravated when wearing enclosed shoe gear. Pain is getting progressively worse and relieved with periodic professional debridement.   Prince Solian, MD is his PCP.    Current Outpatient Medications:  .  acetaminophen (TYLENOL) 500 MG tablet, Take 1,000 mg by mouth daily as needed for moderate pain or headache., Disp: , Rfl:  .  allopurinol (ZYLOPRIM) 300 MG tablet, Take 300 mg by mouth daily. , Disp: , Rfl:  .  buPROPion (WELLBUTRIN XL) 300 MG 24 hr tablet, Take 300 mg by mouth daily. , Disp: , Rfl:  .  calcium carbonate (TUMS - DOSED IN MG ELEMENTAL CALCIUM) 500 MG chewable tablet, Chew 3 tablets by mouth 2 (two) times daily as needed for indigestion or heartburn., Disp: , Rfl:  .  diazepam (VALIUM) 5 MG tablet, Take 5 mg by mouth at bedtime as needed (sleep). , Disp: , Rfl:  .  Empagliflozin (JARDIANCE PO), Take by mouth daily., Disp: , Rfl:  .  furosemide (LASIX) 40 MG tablet, Take 40 mg by mouth every other day., Disp: , Rfl:  .  furosemide (LASIX) 80 MG tablet, , Disp: , Rfl:  .  gabapentin (NEURONTIN) 800 MG tablet, Take 800 mg by mouth at bedtime. , Disp: , Rfl:  .  lidocaine-prilocaine (EMLA) cream, Apply 1 application topically as needed. Apply to port before chemotherapy., Disp: 30 g, Rfl: 0 .  Melatonin 10 MG TABS, Take 1 tablet by mouth at bedtime as needed (sleep). , Disp: , Rfl:  .  metoCLOPramide (REGLAN) 5 MG/5ML solution, Take 10 mLs (10 mg total) by mouth 4 (four) times daily -  before meals and at bedtime., Disp: 120 mL, Rfl: 0 .  Nebivolol HCl (BYSTOLIC) 20 MG TABS, Take 20 mg by mouth daily. , Disp: , Rfl:  .  Nutritional Supplements (ENSURE ENLIVE PO), Take 237 mLs by mouth 2 (two) times daily. , Disp: , Rfl:  .  ondansetron (ZOFRAN-ODT) 4 MG  disintegrating tablet, Take 1 tablet (4 mg total) by mouth every 6 (six) hours as needed for nausea., Disp: 20 tablet, Rfl: 3 .  pantoprazole (PROTONIX) 40 MG tablet, Take 1 tablet (40 mg total) by mouth daily., Disp: 30 tablet, Rfl: 3 .  polycarbophil (FIBERCON) 625 MG tablet, Take 1 tablet (625 mg total) by mouth daily., Disp: 30 tablet, Rfl: 3 .  prochlorperazine (COMPAZINE) 10 MG tablet, Take 1 tablet (10 mg total) by mouth every 6 (six) hours as needed for nausea or vomiting., Disp: 30 tablet, Rfl: 1 .  tamsulosin (FLOMAX) 0.4 MG CAPS capsule, TAKE 1 CAPSULE BY MOUTH DAILY, Disp: 30 capsule, Rfl: 2 .  traZODone (DESYREL) 50 MG tablet, Take 50 mg by mouth at bedtime. , Disp: , Rfl:  .  Verapamil HCl CR 300 MG CP24, Take 300 mg by mouth at bedtime. , Disp: , Rfl:  .  Water For Irrigation, Sterile (FREE WATER) SOLN, Place 100 mLs into feeding tube every 4 (four) hours., Disp: 1000 mL, Rfl: 0  Current Facility-Administered Medications:  .  0.9 %  sodium chloride infusion, 500 mL, Intravenous, Once, Ladene Artist, MD  No Known Allergies  Objective: Vascular Examination: Capillary refill time immediate x 10 digits.  Dorsalis pedis pulses present b/l.  Posterior tibial pulses present b/l.  No digital hair x 10 digits.  Skin temperature WNL b/l.  Dermatological Examination: Skin with normal turgor, texture and tone b/l.  Toenails 1-5 b/l discolored, thick, dystrophic with subungual debris and pain with palpation to nailbeds due to thickness of nails.  Hyperkeratotic lesions submet head 4 b/l and submet head 1, 3 right foot. No erythema, no edema, no drainage, no flocculence noted.   Musculoskeletal: Muscle strength 5/5 to all LE muscle groups.  Neurological: Sensation intact 2/5 right  with 10 gram monofilament.  Vibratory sensation intact b/l.  Assessment: 1. Painful onychomycosis toenails 1-5 b/l 2.   Calluses submet head submet head 4 b/l and submet head 1, 3 right  foot 3.   Idiopathic neuropathy  Plan: 1. Toenails 1-5 b/l were debrided in length and girth without iatrogenic bleeding. 2. Calluses pared submet head 4 b/l and submet head 1, 3 right foot utilizing sterile scalpel blade without incident. 3. Patient to continue soft, supportive shoe gear daily. 4. Patient to report any pedal injuries to medical professional immediately. 5. Follow up 3 months.  6. Patient/POA to call should there be a concern in the interim.

## 2018-08-23 ENCOUNTER — Other Ambulatory Visit: Payer: Self-pay

## 2018-08-23 ENCOUNTER — Inpatient Hospital Stay: Payer: Medicare Other | Attending: Oncology

## 2018-08-23 DIAGNOSIS — Z9221 Personal history of antineoplastic chemotherapy: Secondary | ICD-10-CM | POA: Diagnosis not present

## 2018-08-23 DIAGNOSIS — M2580 Other specified joint disorders, unspecified joint: Secondary | ICD-10-CM | POA: Insufficient documentation

## 2018-08-23 DIAGNOSIS — M419 Scoliosis, unspecified: Secondary | ICD-10-CM | POA: Insufficient documentation

## 2018-08-23 DIAGNOSIS — D631 Anemia in chronic kidney disease: Secondary | ICD-10-CM

## 2018-08-23 DIAGNOSIS — Z8546 Personal history of malignant neoplasm of prostate: Secondary | ICD-10-CM | POA: Insufficient documentation

## 2018-08-23 DIAGNOSIS — Z923 Personal history of irradiation: Secondary | ICD-10-CM | POA: Insufficient documentation

## 2018-08-23 DIAGNOSIS — I2510 Atherosclerotic heart disease of native coronary artery without angina pectoris: Secondary | ICD-10-CM | POA: Insufficient documentation

## 2018-08-23 DIAGNOSIS — Z79899 Other long term (current) drug therapy: Secondary | ICD-10-CM | POA: Insufficient documentation

## 2018-08-23 DIAGNOSIS — Z8500 Personal history of malignant neoplasm of unspecified digestive organ: Secondary | ICD-10-CM | POA: Diagnosis not present

## 2018-08-23 DIAGNOSIS — N289 Disorder of kidney and ureter, unspecified: Secondary | ICD-10-CM | POA: Diagnosis not present

## 2018-08-23 DIAGNOSIS — D638 Anemia in other chronic diseases classified elsewhere: Secondary | ICD-10-CM | POA: Insufficient documentation

## 2018-08-23 DIAGNOSIS — I700 Atherosclerosis of aorta: Secondary | ICD-10-CM | POA: Insufficient documentation

## 2018-08-23 DIAGNOSIS — Z452 Encounter for adjustment and management of vascular access device: Secondary | ICD-10-CM | POA: Insufficient documentation

## 2018-08-23 DIAGNOSIS — Z95828 Presence of other vascular implants and grafts: Secondary | ICD-10-CM

## 2018-08-23 MED ORDER — HEPARIN SOD (PORK) LOCK FLUSH 100 UNIT/ML IV SOLN
500.0000 [IU] | Freq: Once | INTRAVENOUS | Status: AC
  Administered 2018-08-23: 500 [IU]
  Filled 2018-08-23: qty 5

## 2018-08-23 MED ORDER — SODIUM CHLORIDE 0.9% FLUSH
10.0000 mL | Freq: Once | INTRAVENOUS | Status: AC
  Administered 2018-08-23: 10 mL
  Filled 2018-08-23: qty 10

## 2018-09-07 ENCOUNTER — Telehealth: Payer: Self-pay

## 2018-09-07 NOTE — Telephone Encounter (Signed)
That is fine 

## 2018-09-07 NOTE — Telephone Encounter (Signed)
Copied from [place] [id number]. Topic: Appointment Scheduling - New Patient >> Sep 07, 2018  9:50 AM Berneta Levins wrote: Pt's daughter in law, Panayiotis [place], calling.  States that she see's Dr. Carollee Herter and would like to know if she would be willing to take on her father in law as a patient.  Pt has Medicare and [employer]. Pt can be reached at [phone]

## 2018-09-08 NOTE — Telephone Encounter (Signed)
Per Dr. Etter Sjogren, it is okay to take Jacob [name] as a new patient. Please schedule whenever the patient is comfortable. Thank you

## 2018-09-08 NOTE — Telephone Encounter (Signed)
Pt contacted appt is for 09/14/18.Marland KitchenMarland Kitchen. 10am. Awaiting on overide  For appt. Mailed NP Jamison Neighbor

## 2018-09-09 ENCOUNTER — Encounter (HOSPITAL_COMMUNITY): Payer: Self-pay

## 2018-09-09 ENCOUNTER — Inpatient Hospital Stay (HOSPITAL_COMMUNITY): Payer: Medicare Other

## 2018-09-09 ENCOUNTER — Other Ambulatory Visit: Payer: Self-pay

## 2018-09-09 ENCOUNTER — Emergency Department (HOSPITAL_COMMUNITY): Payer: Medicare Other

## 2018-09-09 ENCOUNTER — Inpatient Hospital Stay (HOSPITAL_COMMUNITY)
Admission: EM | Admit: 2018-09-09 | Discharge: 2018-09-19 | DRG: 194 | Disposition: A | Discharge status: Home Health | Payer: Medicare Other | Attending: Internal Medicine | Admitting: Internal Medicine

## 2018-09-09 DIAGNOSIS — K219 Gastro-esophageal reflux disease without esophagitis: Secondary | ICD-10-CM | POA: Diagnosis present

## 2018-09-09 DIAGNOSIS — Z8546 Personal history of malignant neoplasm of prostate: Secondary | ICD-10-CM

## 2018-09-09 DIAGNOSIS — D649 Anemia, unspecified: Secondary | ICD-10-CM | POA: Diagnosis not present

## 2018-09-09 DIAGNOSIS — Z931 Gastrostomy status: Secondary | ICD-10-CM | POA: Diagnosis not present

## 2018-09-09 DIAGNOSIS — K279 Peptic ulcer, site unspecified, unspecified as acute or chronic, without hemorrhage or perforation: Secondary | ICD-10-CM | POA: Diagnosis present

## 2018-09-09 DIAGNOSIS — Z85028 Personal history of other malignant neoplasm of stomach: Secondary | ICD-10-CM | POA: Diagnosis not present

## 2018-09-09 DIAGNOSIS — N184 Chronic kidney disease, stage 4 (severe): Secondary | ICD-10-CM | POA: Diagnosis present

## 2018-09-09 DIAGNOSIS — J189 Pneumonia, unspecified organism: Secondary | ICD-10-CM | POA: Diagnosis present

## 2018-09-09 DIAGNOSIS — Z4659 Encounter for fitting and adjustment of other gastrointestinal appliance and device: Secondary | ICD-10-CM

## 2018-09-09 DIAGNOSIS — Z79890 Hormone replacement therapy (postmenopausal): Secondary | ICD-10-CM

## 2018-09-09 DIAGNOSIS — G4733 Obstructive sleep apnea (adult) (pediatric): Secondary | ICD-10-CM | POA: Diagnosis present

## 2018-09-09 DIAGNOSIS — M109 Gout, unspecified: Secondary | ICD-10-CM | POA: Diagnosis present

## 2018-09-09 DIAGNOSIS — Z923 Personal history of irradiation: Secondary | ICD-10-CM | POA: Diagnosis not present

## 2018-09-09 DIAGNOSIS — Z811 Family history of alcohol abuse and dependence: Secondary | ICD-10-CM

## 2018-09-09 DIAGNOSIS — E669 Obesity, unspecified: Secondary | ICD-10-CM | POA: Diagnosis present

## 2018-09-09 DIAGNOSIS — R0902 Hypoxemia: Secondary | ICD-10-CM

## 2018-09-09 DIAGNOSIS — C169 Malignant neoplasm of stomach, unspecified: Secondary | ICD-10-CM | POA: Diagnosis not present

## 2018-09-09 DIAGNOSIS — K56699 Other intestinal obstruction unspecified as to partial versus complete obstruction: Secondary | ICD-10-CM | POA: Diagnosis present

## 2018-09-09 DIAGNOSIS — Z20828 Contact with and (suspected) exposure to other viral communicable diseases: Secondary | ICD-10-CM | POA: Diagnosis present

## 2018-09-09 DIAGNOSIS — I129 Hypertensive chronic kidney disease with stage 1 through stage 4 chronic kidney disease, or unspecified chronic kidney disease: Secondary | ICD-10-CM | POA: Diagnosis present

## 2018-09-09 DIAGNOSIS — F419 Anxiety disorder, unspecified: Secondary | ICD-10-CM | POA: Diagnosis present

## 2018-09-09 DIAGNOSIS — R109 Unspecified abdominal pain: Secondary | ICD-10-CM

## 2018-09-09 DIAGNOSIS — N179 Acute kidney failure, unspecified: Secondary | ICD-10-CM | POA: Diagnosis present

## 2018-09-09 DIAGNOSIS — N183 Chronic kidney disease, stage 3 unspecified: Secondary | ICD-10-CM | POA: Insufficient documentation

## 2018-09-09 DIAGNOSIS — Z0189 Encounter for other specified special examinations: Secondary | ICD-10-CM

## 2018-09-09 DIAGNOSIS — Z87891 Personal history of nicotine dependence: Secondary | ICD-10-CM

## 2018-09-09 DIAGNOSIS — Z79899 Other long term (current) drug therapy: Secondary | ICD-10-CM

## 2018-09-09 DIAGNOSIS — E872 Acidosis: Secondary | ICD-10-CM | POA: Diagnosis present

## 2018-09-09 DIAGNOSIS — Z6830 Body mass index (BMI) 30.0-30.9, adult: Secondary | ICD-10-CM

## 2018-09-09 DIAGNOSIS — A419 Sepsis, unspecified organism: Secondary | ICD-10-CM

## 2018-09-09 DIAGNOSIS — E785 Hyperlipidemia, unspecified: Secondary | ICD-10-CM | POA: Diagnosis present

## 2018-09-09 DIAGNOSIS — M1990 Unspecified osteoarthritis, unspecified site: Secondary | ICD-10-CM | POA: Diagnosis present

## 2018-09-09 DIAGNOSIS — E875 Hyperkalemia: Secondary | ICD-10-CM | POA: Diagnosis present

## 2018-09-09 DIAGNOSIS — N186 End stage renal disease: Secondary | ICD-10-CM

## 2018-09-09 DIAGNOSIS — Z8719 Personal history of other diseases of the digestive system: Secondary | ICD-10-CM

## 2018-09-09 DIAGNOSIS — D631 Anemia in chronic kidney disease: Secondary | ICD-10-CM | POA: Diagnosis present

## 2018-09-09 DIAGNOSIS — I10 Essential (primary) hypertension: Secondary | ICD-10-CM | POA: Diagnosis not present

## 2018-09-09 DIAGNOSIS — K56609 Unspecified intestinal obstruction, unspecified as to partial versus complete obstruction: Secondary | ICD-10-CM

## 2018-09-09 DIAGNOSIS — E7800 Pure hypercholesterolemia, unspecified: Secondary | ICD-10-CM | POA: Diagnosis present

## 2018-09-09 DIAGNOSIS — N178 Other acute kidney failure: Secondary | ICD-10-CM | POA: Diagnosis not present

## 2018-09-09 DIAGNOSIS — D72829 Elevated white blood cell count, unspecified: Secondary | ICD-10-CM | POA: Diagnosis not present

## 2018-09-09 DIAGNOSIS — R0602 Shortness of breath: Secondary | ICD-10-CM

## 2018-09-09 DIAGNOSIS — J188 Other pneumonia, unspecified organism: Secondary | ICD-10-CM

## 2018-09-09 DIAGNOSIS — Z5329 Procedure and treatment not carried out because of patient's decision for other reasons: Secondary | ICD-10-CM | POA: Diagnosis present

## 2018-09-09 LAB — TROPONIN I (HIGH SENSITIVITY)
Troponin I (High Sensitivity): 22 ng/L — ABNORMAL HIGH (ref <18)
Troponin I (High Sensitivity): 19 ng/L — ABNORMAL HIGH (ref <18)

## 2018-09-09 LAB — COMPREHENSIVE METABOLIC PANEL
ALT: 11 U/L (ref 0–44)
AST: 16 U/L (ref 15–41)
Albumin: 3 g/dL — ABNORMAL LOW (ref 3.5–5.0)
Alkaline Phosphatase: 79 U/L (ref 38–126)
Anion gap: 15 (ref 5–15)
BUN: 82 mg/dL — ABNORMAL HIGH (ref 8–23)
CO2: 20 mmol/L — ABNORMAL LOW (ref 22–32)
Calcium: 10.1 mg/dL (ref 8.9–10.3)
Chloride: 99 mmol/L (ref 98–111)
Creatinine, Ser: 4.35 mg/dL — ABNORMAL HIGH (ref 0.61–1.24)
GFR calc Af Amer: 15 mL/min — ABNORMAL LOW (ref >60)
GFR calc non Af Amer: 13 mL/min — ABNORMAL LOW (ref >60)
Glucose, Bld: 119 mg/dL — ABNORMAL HIGH (ref 70–99)
Potassium: 4.4 mmol/L (ref 3.5–5.1)
Sodium: 134 mmol/L — ABNORMAL LOW (ref 135–145)
Total Bilirubin: 1.4 mg/dL — ABNORMAL HIGH (ref 0.3–1.2)
Total Protein: 7.4 g/dL (ref 6.5–8.1)

## 2018-09-09 LAB — URINALYSIS, ROUTINE W REFLEX MICROSCOPIC
Bilirubin Urine: NEGATIVE
Glucose, UA: NEGATIVE mg/dL
Hgb urine dipstick: NEGATIVE
Ketones, ur: 5 mg/dL — AB
Nitrite: NEGATIVE
Protein, ur: 30 mg/dL — AB
Specific Gravity, Urine: 1.016 (ref 1.005–1.030)
pH: 5 (ref 5.0–8.0)

## 2018-09-09 LAB — CBC WITH DIFFERENTIAL/PLATELET
Abs Immature Granulocytes: 0.22 10*3/uL — ABNORMAL HIGH (ref 0.00–0.07)
Basophils Absolute: 0.1 10*3/uL (ref 0.0–0.1)
Basophils Relative: 0 %
Eosinophils Absolute: 0.2 10*3/uL (ref 0.0–0.5)
Eosinophils Relative: 1 %
HCT: 34.2 % — ABNORMAL LOW (ref 39.0–52.0)
Hemoglobin: 11.7 g/dL — ABNORMAL LOW (ref 13.0–17.0)
Immature Granulocytes: 2 %
Lymphocytes Relative: 6 %
Lymphs Abs: 0.9 10*3/uL (ref 0.7–4.0)
MCH: 32.6 pg (ref 26.0–34.0)
MCHC: 34.2 g/dL (ref 30.0–36.0)
MCV: 95.3 fL (ref 80.0–100.0)
Monocytes Absolute: 1.4 10*3/uL — ABNORMAL HIGH (ref 0.1–1.0)
Monocytes Relative: 10 %
Neutro Abs: 10.9 10*3/uL — ABNORMAL HIGH (ref 1.7–7.7)
Neutrophils Relative %: 81 %
Platelets: 252 10*3/uL (ref 150–400)
RBC: 3.59 MIL/uL — ABNORMAL LOW (ref 4.22–5.81)
RDW: 14.6 % (ref 11.5–15.5)
WBC: 13.6 10*3/uL — ABNORMAL HIGH (ref 4.0–10.5)
nRBC: 0 % (ref 0.0–0.2)

## 2018-09-09 LAB — SARS CORONAVIRUS 2 BY RT PCR (HOSPITAL ORDER, PERFORMED IN ~~LOC~~ HOSPITAL LAB): SARS Coronavirus 2: NEGATIVE

## 2018-09-09 LAB — PROCALCITONIN: Procalcitonin: 10.85 ng/mL

## 2018-09-09 LAB — LIPASE, BLOOD: Lipase: 39 U/L (ref 11–51)

## 2018-09-09 LAB — LACTIC ACID, PLASMA: Lactic Acid, Venous: 1.1 mmol/L (ref 0.5–1.9)

## 2018-09-09 IMAGING — CT CT ABDOMEN AND PELVIS WITHOUT CONTRAST
2 of 4 series · 15 of 46 positions shown, 17 images · non-contrast
Comparison: [DATE] and [DATE]

CLINICAL DATA: Abdominal pain with nausea and vomiting

EXAM:
CT ABDOMEN AND PELVIS WITHOUT CONTRAST
TECHNIQUE: Multidetector CT imaging of the abdomen and pelvis was performed
following the standard protocol without IV contrast. Oral contrast
was also administered.

[Series 3: a/p w/o 5mm · axial · non-contrast · 0.96mm/px · z∈[+952,+1457]mm · 12 of 111 slices shown, 14 images]
[im 5/111  soft-tissue]
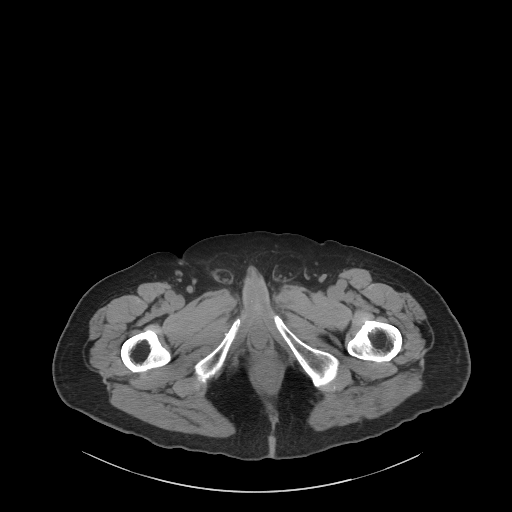
[im 5/111  bone]
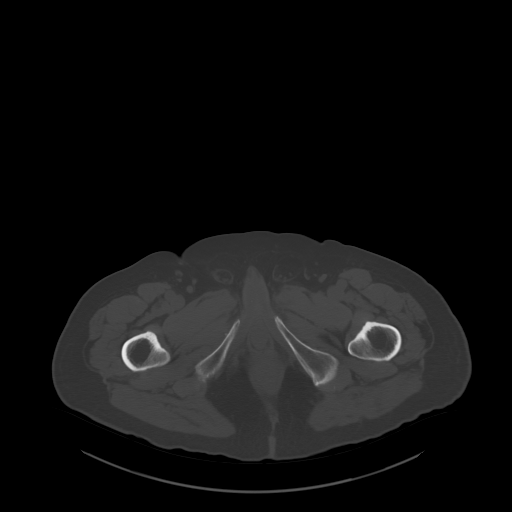
[im 15/111  soft-tissue]
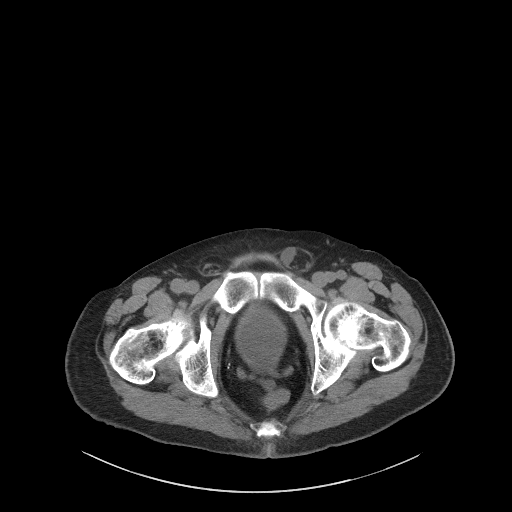
[im 24/111  soft-tissue]
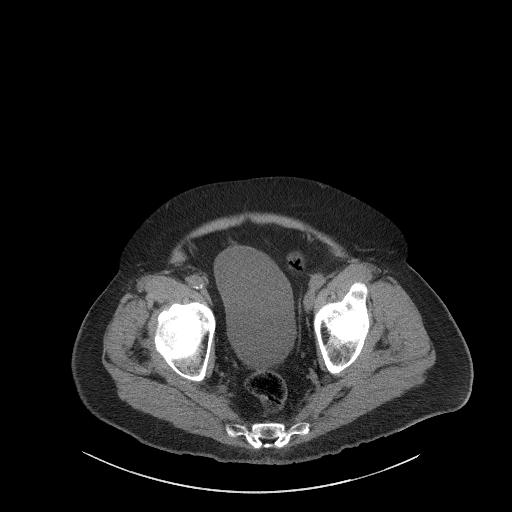
[im 34/111  soft-tissue]
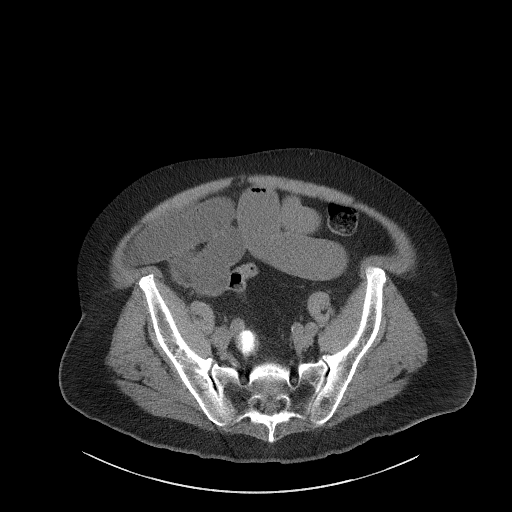
[im 44/111  soft-tissue]
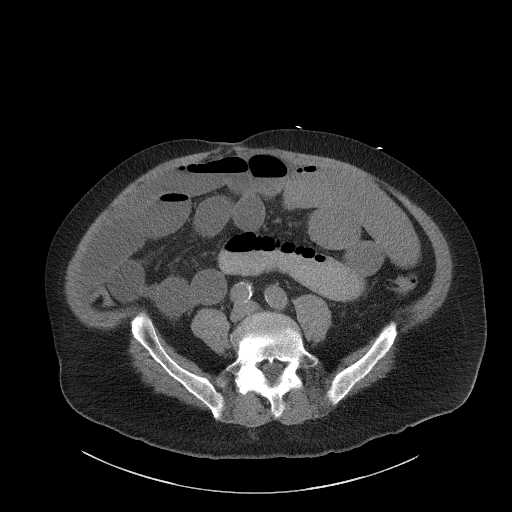
[im 53/111  soft-tissue]
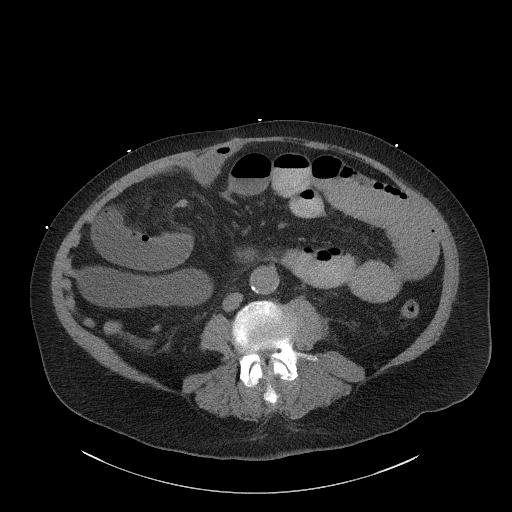
[im 58/111  soft-tissue]
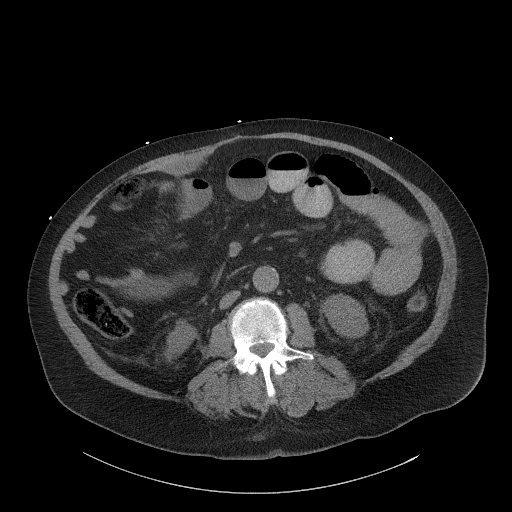
[im 67/111  soft-tissue]
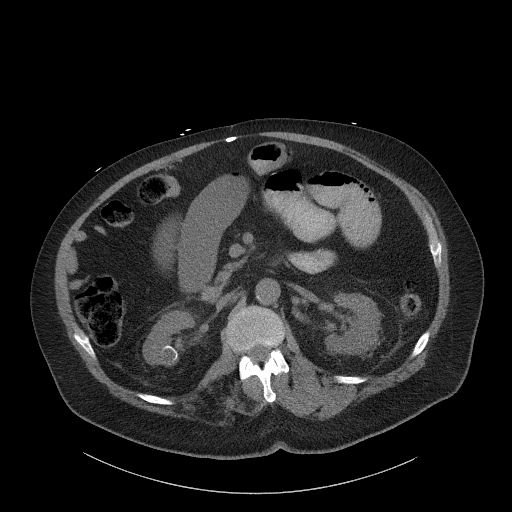
[im 77/111  soft-tissue]
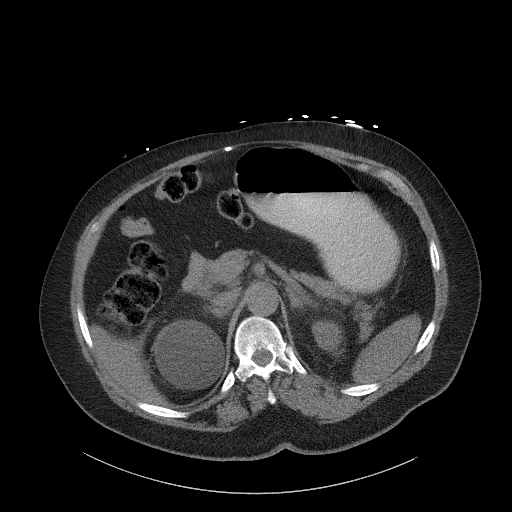
[im 77/111  bone]
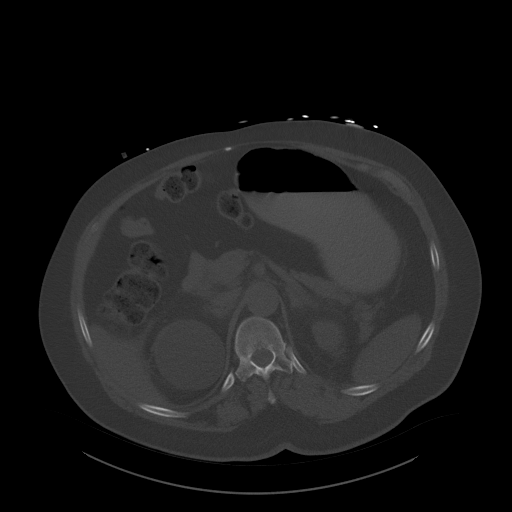
[im 87/111  soft-tissue]
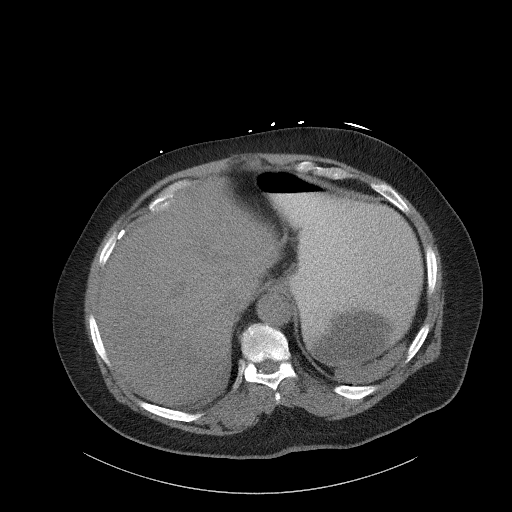
[im 96/111  soft-tissue]
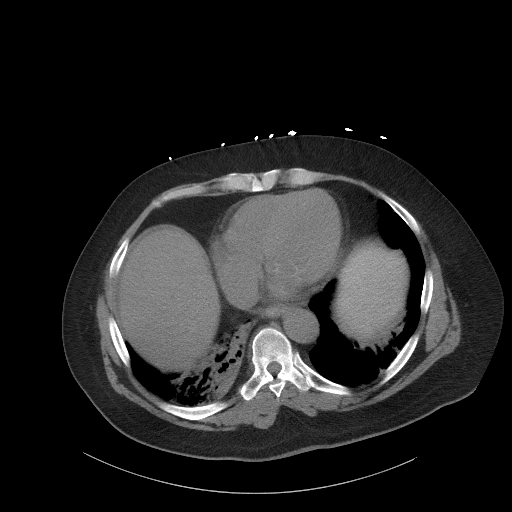
[im 106/111  soft-tissue]
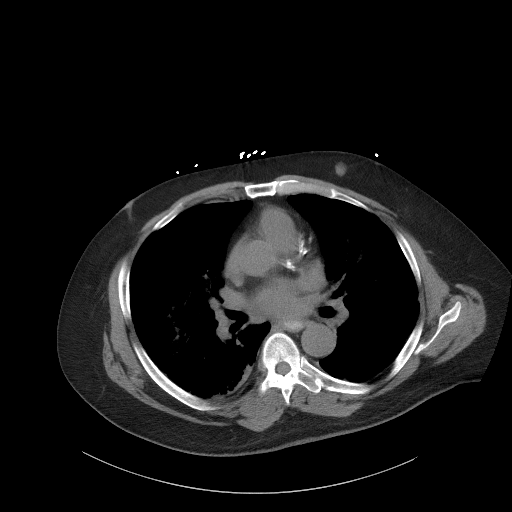

[Series 6: a/p w/o cor · coronal · non-contrast · 0.97mm/px · 3 of 170 slices shown]
[im 57/170  soft-tissue]
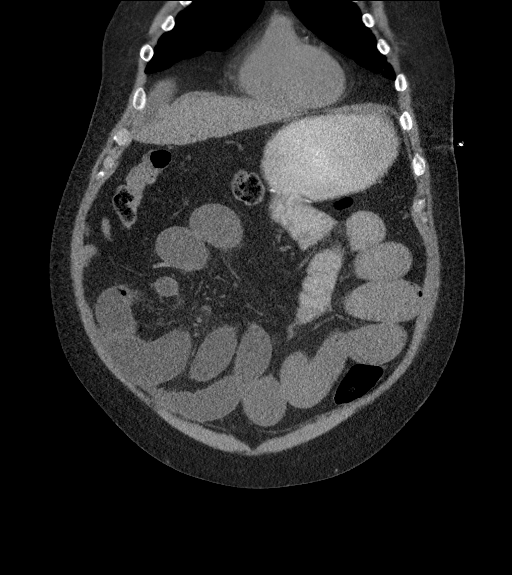
[im 76/170  soft-tissue]
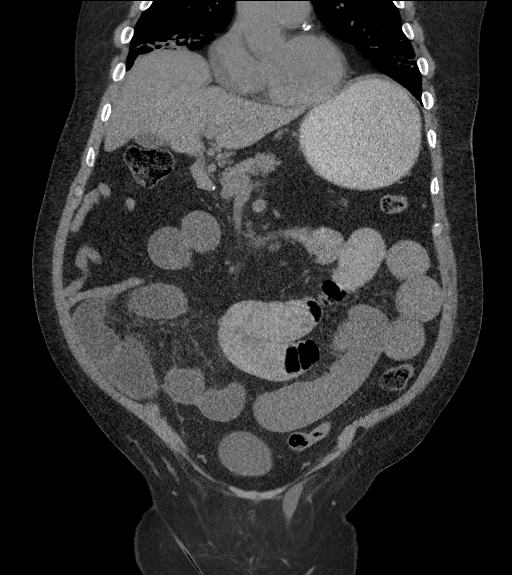
[im 94/170  soft-tissue]
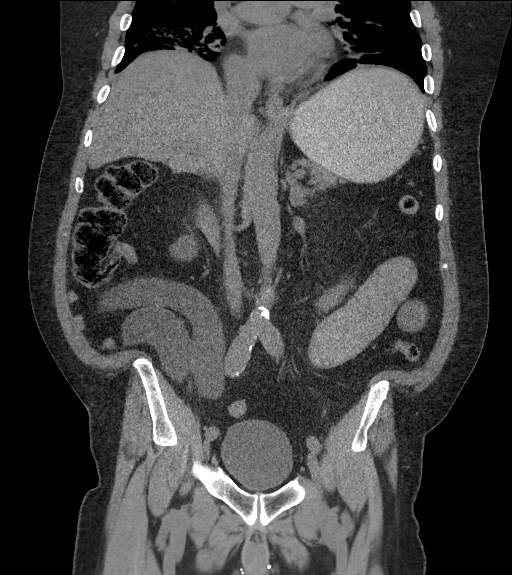

[15 of 46 positions shown; findings below may reference images not displayed]

FINDINGS: Lower chest: There are multiple areas of airspace consolidation in
the lower lobes consistent with multifocal pneumonia. Areas of
consolidation are noted in portions of the right middle and right
lower lobes. Opacification on the left is more patchy with areas of
ground-glass type opacity in the inferior lingula and anterior
segment left lower lobe.

There are foci of coronary artery calcification. A small amount of
contrast is seen in the distal esophagus.

Hepatobiliary: No focal liver lesions are appreciable. Gallbladder
wall is not appreciably thickened by CT. There is no evident biliary
duct dilatation.

Pancreas: There is no pancreatic mass or inflammatory focus.

Spleen: No splenic lesions are evident.

Adrenals/Urinary Tract: Adrenals bilaterally appear unremarkable.
There is a cyst arising from the upper pole of the right kidney
measuring 6.7 x 6.6 cm. There is a mass arising from the mid right
kidney with a calcified rim measuring 2.0 x 1.8 cm, stable. There is
no demonstrable hydronephrosis on either side. There is no renal or
ureteral calculus on either side. Urinary bladder is midline with
wall thickness within normal limits.

Stomach/Bowel: Stomach is distended with fluid, air, and apparent
food material in the dependent portion of the stomach. There is
dilatation of most small bowel loops. There is a transition zone in
the mid ileal region consistent with small bowel obstruction.
Terminal ileum appears normal. No free air or portal venous air.

Vascular/Lymphatic: There is no abdominal aortic aneurysm. There are
foci of vascular calcification in the aorta and iliac arteries. No
adenopathy is evident in the abdomen or pelvis.

Reproductive: There are apparent seed implants in the region of the
prostate. Prostate and seminal vesicles are normal in size and
contour. No pelvic mass is demonstrable.

Other: There is fat in each inguinal ring. Question atrophic testis
in the left inguinal ring medially. Appendix appears normal. No
abscess is evident in the abdomen or pelvis. There is a slight
amount of ascites adjacent to the liver.

Musculoskeletal: There is degenerative change in the lumbar spine,
most notably at L5-S1. There are no blastic or lytic bone lesions.
No intramuscular lesions are evident.
IMPRESSION: 1. Small bowel obstruction with transition zone in the mid ileal
region. No free air or portal venous air is demonstrable.

2.  Multifocal pneumonia.

3. Slight amount of ascites noted adjacent to the liver on the
right.

4.  Seed implants in region of prostate.

5. Fat in each inguinal ring. Suspect atrophic testis left inguinal
ring.

6.  No abscess in the abdomen or pelvis.

7. Aortic and iliac artery atherosclerosis. Foci of coronary artery
calcification noted.

## 2018-09-09 IMAGING — DX PORTABLE ABDOMEN - 1 VIEW
1 series · 1 of 1 positions shown · non-contrast
Comparison: [DATE]

CLINICAL DATA: Nasogastric placement.

EXAM:
PORTABLE ABDOMEN - 1 VIEW

[abdomen kub]
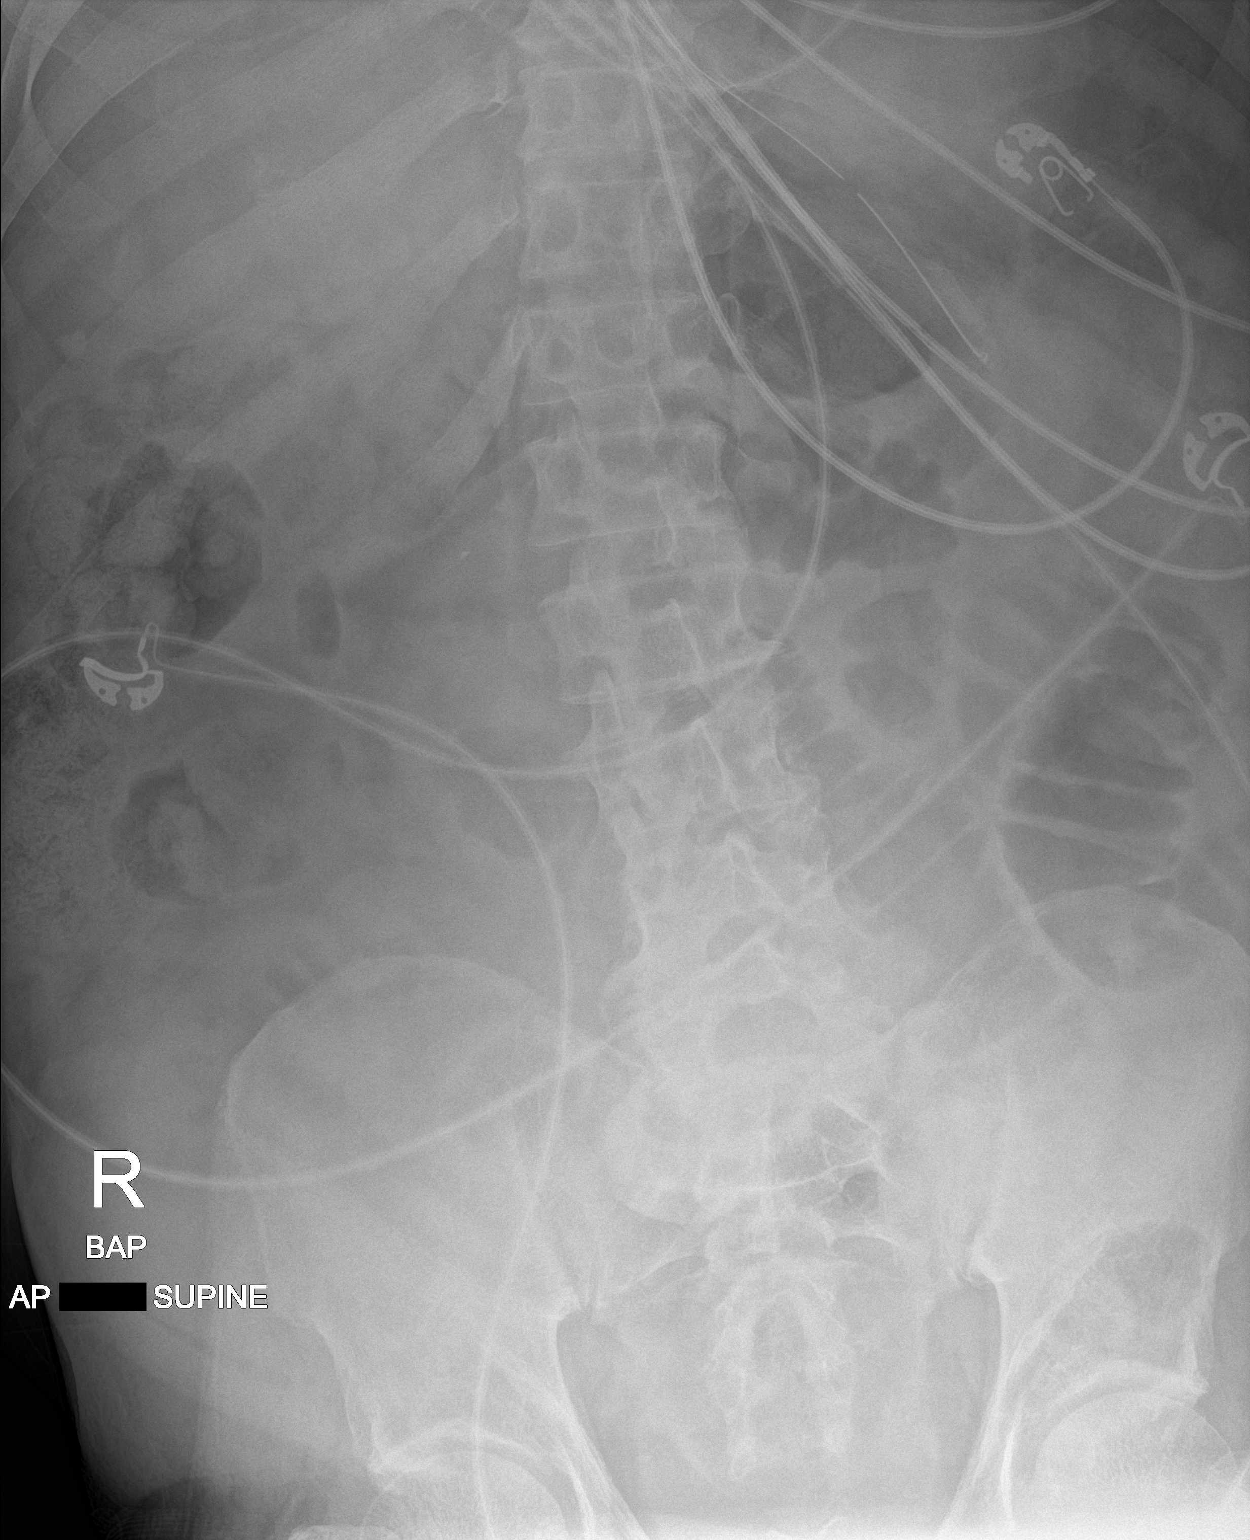

[1 of 1 positions shown; findings below may reference images not displayed]

FINDINGS: Nasogastric tube tip is in the midportion of the stomach. Mild small
bowel dilatation, less than on the recent CT.
IMPRESSION: Nasogastric tube tip in the midportion of the stomach. Decrease in
small bowel dilatation.

## 2018-09-09 IMAGING — DX PORTABLE CHEST - 1 VIEW
1 series · 1 of 1 positions shown · non-contrast
Comparison: [DATE]

CLINICAL DATA: Cough and hemoptysis

EXAM:
PORTABLE CHEST 1 VIEW

[chest]
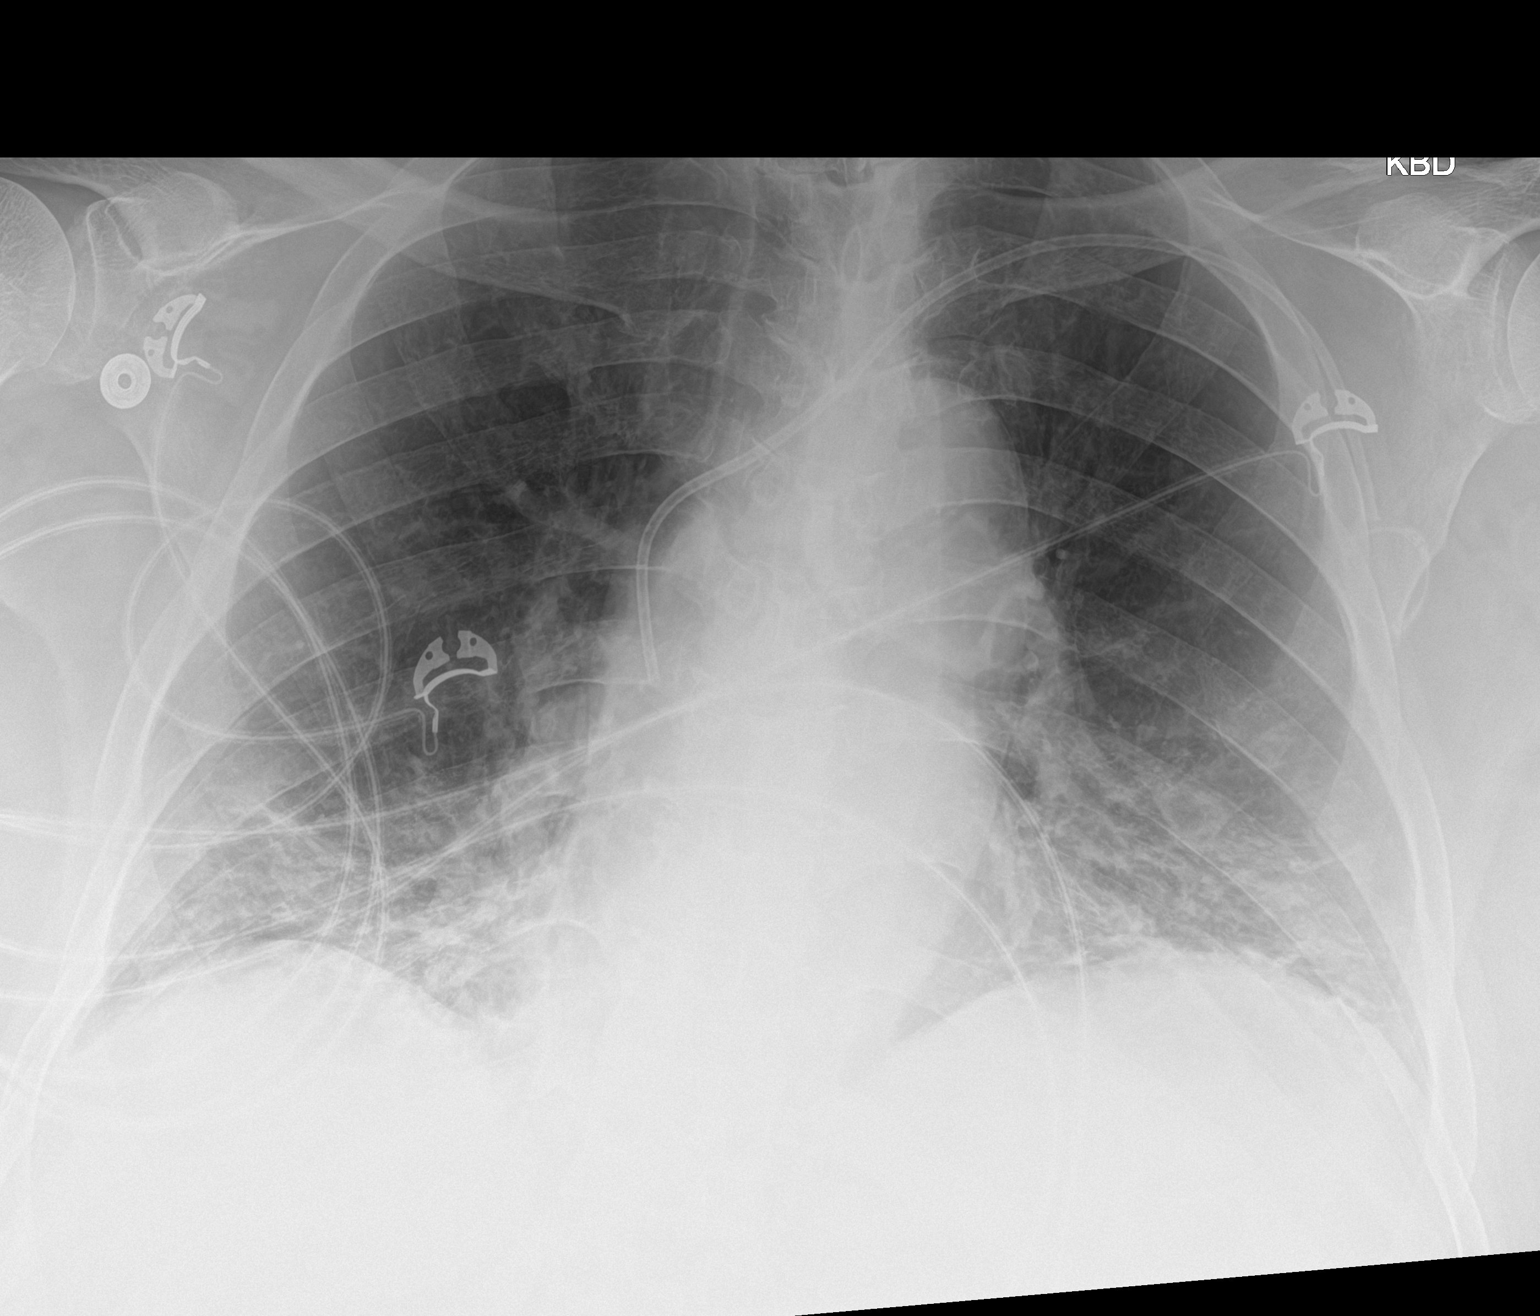

[1 of 1 positions shown; findings below may reference images not displayed]

FINDINGS: Port-A-Cath tip is in the superior vena cava. No pneumothorax. There
is patchy airspace opacity in the lung bases. Lungs elsewhere are
clear. Heart size and pulmonary vascularity are normal. No
adenopathy. No bone lesions.
IMPRESSION: Bibasilar patchy infiltrate, felt to represent multifocal pneumonia.
No adenopathy. Heart size normal. Port-A-Cath tip in superior vena
cava.

## 2018-09-09 MED ORDER — ONDANSETRON HCL 4 MG/2ML IJ SOLN
4.0000 mg | Freq: Once | INTRAMUSCULAR | Status: AC
  Administered 2018-09-09: 16:00:00 4 mg via INTRAVENOUS
  Filled 2018-09-09: qty 2

## 2018-09-09 MED ORDER — LABETALOL HCL 5 MG/ML IV SOLN
5.0000 mg | Freq: Four times a day (QID) | INTRAVENOUS | Status: DC
  Administered 2018-09-09 – 2018-09-17 (×29): 5 mg via INTRAVENOUS
  Filled 2018-09-09 (×29): qty 4

## 2018-09-09 MED ORDER — HEPARIN SODIUM (PORCINE) 5000 UNIT/ML IJ SOLN
5000.0000 [IU] | Freq: Three times a day (TID) | INTRAMUSCULAR | Status: DC
  Administered 2018-09-09 – 2018-09-19 (×30): 5000 [IU] via SUBCUTANEOUS
  Filled 2018-09-09 (×30): qty 1

## 2018-09-09 MED ORDER — MORPHINE SULFATE (PF) 2 MG/ML IV SOLN
2.0000 mg | INTRAVENOUS | Status: DC | PRN
  Administered 2018-09-09 – 2018-09-13 (×4): 2 mg via INTRAVENOUS
  Filled 2018-09-09 (×5): qty 1

## 2018-09-09 MED ORDER — ONDANSETRON HCL 4 MG/2ML IJ SOLN
4.0000 mg | Freq: Once | INTRAMUSCULAR | Status: AC
  Administered 2018-09-09: 4 mg via INTRAVENOUS
  Filled 2018-09-09: qty 2

## 2018-09-09 MED ORDER — SODIUM CHLORIDE 0.9 % IV SOLN
2.0000 g | INTRAVENOUS | Status: DC
  Administered 2018-09-09 – 2018-09-15 (×7): 2 g via INTRAVENOUS
  Filled 2018-09-09 (×8): qty 20

## 2018-09-09 MED ORDER — SODIUM CHLORIDE 0.9 % IV BOLUS
500.0000 mL | Freq: Once | INTRAVENOUS | Status: AC
  Administered 2018-09-09: 10:00:00 500 mL via INTRAVENOUS

## 2018-09-09 MED ORDER — HYDRALAZINE HCL 20 MG/ML IJ SOLN
5.0000 mg | INTRAMUSCULAR | Status: DC | PRN
  Administered 2018-09-09 – 2018-09-10 (×2): 5 mg via INTRAVENOUS
  Filled 2018-09-09 (×2): qty 1

## 2018-09-09 MED ORDER — ACETAMINOPHEN 325 MG PO TABS
650.0000 mg | ORAL_TABLET | Freq: Four times a day (QID) | ORAL | Status: DC | PRN
  Administered 2018-09-14 – 2018-09-16 (×2): 650 mg via ORAL
  Filled 2018-09-09 (×2): qty 2

## 2018-09-09 MED ORDER — ONDANSETRON HCL 4 MG/2ML IJ SOLN
4.0000 mg | Freq: Four times a day (QID) | INTRAMUSCULAR | Status: DC | PRN
  Administered 2018-09-09 – 2018-09-18 (×5): 4 mg via INTRAVENOUS
  Filled 2018-09-09 (×5): qty 2

## 2018-09-09 MED ORDER — METHYLPREDNISOLONE SODIUM SUCC 40 MG IJ SOLR
40.0000 mg | INTRAMUSCULAR | Status: DC
  Administered 2018-09-09 – 2018-09-14 (×6): 40 mg via INTRAVENOUS
  Filled 2018-09-09 (×6): qty 1

## 2018-09-09 MED ORDER — PHENOL 1.4 % MT LIQD
1.0000 | OROMUCOSAL | Status: DC | PRN
  Administered 2018-09-10: 1 via OROMUCOSAL
  Filled 2018-09-09: qty 177

## 2018-09-09 MED ORDER — SODIUM CHLORIDE 0.9 % IV SOLN
INTRAVENOUS | Status: DC
  Administered 2018-09-09 – 2018-09-14 (×6): via INTRAVENOUS

## 2018-09-09 MED ORDER — SODIUM CHLORIDE 0.9 % IV SOLN
500.0000 mg | INTRAVENOUS | Status: DC
  Administered 2018-09-09 – 2018-09-15 (×7): 500 mg via INTRAVENOUS
  Filled 2018-09-09 (×8): qty 500

## 2018-09-09 MED ORDER — HYDROMORPHONE HCL 1 MG/ML IJ SOLN
0.5000 mg | Freq: Once | INTRAMUSCULAR | Status: AC
  Administered 2018-09-09: 08:00:00 0.5 mg via INTRAVENOUS
  Filled 2018-09-09: qty 1

## 2018-09-09 NOTE — ED Notes (Signed)
ED TO INPATIENT HANDOFF REPORT  ED Nurse Name and Phone #: Caprice Kluver [phone]   S Name/Age/Gender Jacob [name] 73 y.o. male Room/Bed: 034C/034C  Code Status   Code Status: Full Code  Home/SNF/Other Home Patient oriented to: self, place, time and situation Is this baseline? Yes   Triage Complete: Triage complete  Chief Complaint Abdominal Pain  Triage Note Pt came in by [facility] for abd pain & stated it started last night. Pt also stated that he went to [facility] yesterday due to spitting up blood & was diagnosed with pneumonia & started on a PO ABT & sent home. As of this morning his abd continues to hurt rated 8/10 in all quadrants. Upon arrival pt was afebrile (98.4), VSS, endorses nausea but denies vomiting. BS hypoactive x4 quadrants & pt stated his LBM was 4 days ago.   Allergies No Known Allergies  Level of Care/Admitting Diagnosis ED Disposition    ED Disposition Condition [facility] Area: [place] [[id number]]  Level of Care: Med-Surg [16]  Covid Evaluation: Asymptomatic Screening Protocol (No Symptoms)  Diagnosis: Pneumonia [720947]  Admitting Physician: Karmen Bongo [[id number]]  Attending Physician: Karmen Bongo [[id number]]  Estimated length of stay: 3 - 4 days  Certification:: I certify this patient will need inpatient services for at least 2 midnights  PT Class (Do Not Modify): Inpatient [101]  PT Acc Code (Do Not Modify): Private [1]       B Medical/Surgery History Past Medical History:  Diagnosis Date  . Anemia    hx iron deficiency  . Anxiety   . Arthritis    gout  . Back pain   . BPH with obstruction/lower urinary tract symptoms   . Chronic kidney disease    "they said I do"  . Constipation   . Depression   . ED (erectile dysfunction)   . Epigastric pain   . GERD (gastroesophageal reflux disease)   . Heartburn   . History of blood transfusion   . History of radiation therapy 11/03/11-12/29/11    prostate  . Hypercholesterolemia   . Hypertension   . Night sweats   . Post-operative nausea and vomiting 04/09/2016  . Prostate cancer ([place]) 08/14/11   Adenocarcinoma,gleason:3+3=6,& 3+4=7,PSA=5.66  . PUD (peptic ulcer disease)   . Sleep apnea    does not use Cpap but does use O2 @ 2l   . Stomach cancer ([place])   . Ulcer    peptic ulcer hx   Past Surgical History:  Procedure Laterality Date  . colon polyps bx  11/24/06   colon,transverse and rectosigmoid polyps:tubular adenomas and hyperplastic polyps,no high grade dysplasia or malignancy   . duodenal bx  11/24/06   benign  . ESOPHAGOGASTRODUODENOSCOPY N/A 10/27/2015   Procedure: ESOPHAGOGASTRODUODENOSCOPY (EGD);  Surgeon: [name], MD;  Location: [facility];  Service: Endoscopy;  Laterality: N/A;  . EUS N/A 11/08/2015   Procedure: UPPER ENDOSCOPIC ULTRASOUND (EUS) RADIAL;  Surgeon: [name], MD;  Location: [facility];  Service: Endoscopy;  Laterality: N/A;  . GASTRECTOMY N/A 03/25/2016   Procedure: DISTAL GASTRECTOMY;  Surgeon: [name], MD;  Location: [place];  Service: General;  Laterality: N/A;  . gastric bx  11/24/06   chronic active gastritis,with metaplasia and focal changaes of xanthelasma  . GASTROSTOMY N/A 03/25/2016   Procedure: INSERTION OF FEEDING TUBE;  Surgeon: [name], MD;  Location: ;  Service: General;  Laterality: N/A;  . INSERTION PROSTATE RADIATION SEED  12-29-11  .  IR GENERIC HISTORICAL  04/01/2016   IR GJ TUBE CHANGE 04/01/2016 Markus Daft, MD [facility]  . IR GENERIC HISTORICAL  04/05/2016   IR GJ TUBE CHANGE 04/05/2016 Markus Daft, MD [facility]  . LAPAROSCOPIC GASTRECTOMY  03/25/2016   Diagnostic laparoscopy, distal gastrectomy with Billroth 2 reconstruction and gastrojejunostomy tube  . LAPAROSCOPY N/A 03/25/2016   Procedure: DIAGNOSTIC LAPAROSCOPY;  Surgeon: [name], MD;  Location: [place];  Service: General;  Laterality: N/A;  . PORTACATH PLACEMENT Left 12/04/2015    Procedure: INSERTION PORT-A-CATH;  Surgeon: [name], MD;  Location: [place];  Service: General;  Laterality: Left;  . PROSTATE BIOPSY  08/14/11   Adenocarcinoma/volume=58.51cc,gleason=3+3=6 & 3+4=7  . TONSILLECTOMY     73 years old     A IV Location/Drains/Wounds Patient Lines/Drains/Airways Status   Active Line/Drains/Airways    Name:   Placement date:   Placement time:   Site:   Days:   Implanted Port 12/04/15 Left Chest   12/04/15    0906    Chest   1010   Peripheral IV 09/09/18 Right Antecubital   09/09/18    0807    Antecubital   less than 1   Peripheral IV 09/09/18 Left Antecubital   09/09/18    1125    Antecubital   less than 1   NG/OG Tube Nasogastric 16 Fr. Right nare Xray   09/09/18    1530    Right nare   less than 1   Gastrostomy/Enterostomy Gastrostomy;Jejunostomy 20 Fr.   04/05/16    1300    -   887   Incision (Closed) 12/04/15 Chest Left   12/04/15    0943     1010   Incision (Closed) 03/25/16 Abdomen Other (Comment)   03/25/16    0852     898   Incision (Closed) 03/25/16 Abdomen   03/25/16    1210     898   Incision - 1 Port Abdomen 1: Left;Upper   03/25/16    1211     898          Intake/Output Last 24 hours No intake or output data in the 24 hours ending 09/09/18 1636  Labs/Imaging Results for orders placed or performed during the hospital encounter of 09/09/18 (from the past 48 hour(s))  CBC with Differential     Status: Abnormal   Collection Time: 09/09/18  8:05 AM  Result Value Ref Range   WBC 13.6 (H) 4.0 - 10.5 K/uL   RBC 3.59 (L) 4.22 - 5.81 MIL/uL   Hemoglobin 11.7 (L) 13.0 - 17.0 g/dL   HCT 34.2 (L) 39.0 - 52.0 %   MCV 95.3 80.0 - 100.0 fL   MCH 32.6 26.0 - 34.0 pg   MCHC 34.2 30.0 - 36.0 g/dL   RDW 14.6 11.5 - 15.5 %   Platelets 252 150 - 400 K/uL   nRBC 0.0 0.0 - 0.2 %   Neutrophils Relative % 81 %   Neutro Abs 10.9 (H) 1.7 - 7.7 K/uL   Lymphocytes Relative 6 %   Lymphs Abs 0.9 0.7 - 4.0 K/uL   Monocytes Relative 10 %   Monocytes Absolute  1.4 (H) 0.1 - 1.0 K/uL   Eosinophils Relative 1 %   Eosinophils Absolute 0.2 0.0 - 0.5 K/uL   Basophils Relative 0 %   Basophils Absolute 0.1 0.0 - 0.1 K/uL   Immature Granulocytes 2 %   Abs Immature Granulocytes 0.22 (H) 0.00 - 0.07 K/uL  Comment: Performed at [facility], [place] [address], [place], [place] [address]  Comprehensive metabolic panel     Status: Abnormal   Collection Time: 09/09/18  8:05 AM  Result Value Ref Range   Sodium 134 (L) 135 - 145 mmol/L   Potassium 4.4 3.5 - 5.1 mmol/L   Chloride 99 98 - 111 mmol/L   CO2 20 (L) 22 - 32 mmol/L   Glucose, Bld 119 (H) 70 - 99 mg/dL   BUN 82 (H) 8 - 23 mg/dL   Creatinine, Ser 4.35 (H) 0.61 - 1.24 mg/dL   Calcium 10.1 8.9 - 10.3 mg/dL   Total Protein 7.4 6.5 - 8.1 g/dL   Albumin 3.0 (L) 3.5 - 5.0 g/dL   AST 16 15 - 41 U/L   ALT 11 0 - 44 U/L   Alkaline Phosphatase 79 38 - 126 U/L   Total Bilirubin 1.4 (H) 0.3 - 1.2 mg/dL   GFR calc non Af Amer 13 (L) >60 mL/min   GFR calc Af Amer 15 (L) >60 mL/min   Anion gap 15 5 - 15    Comment: Performed at [facility], [place] [address] [place], [place] [address]  Lipase, blood     Status: None   Collection Time: 09/09/18  8:05 AM  Result Value Ref Range   Lipase 39 11 - 51 U/L    Comment: Performed at [facility] [address] [place], [place] [address]  SARS Coronavirus 2 [facility] order, Performed in [facility] hospital lab) Nasopharyngeal Nasopharyngeal Swab     Status: None   Collection Time: 09/09/18  8:10 AM   Specimen: Nasopharyngeal Swab  Result Value Ref Range   SARS Coronavirus 2 NEGATIVE NEGATIVE    Comment: (NOTE) If result is NEGATIVE SARS-CoV-2 target nucleic acids are NOT DETECTED. The SARS-CoV-2 RNA is generally detectable in upper and lower  respiratory specimens during the acute phase of infection. The lowest  concentration of SARS-CoV-2 viral copies this assay can detect is 250  copies / mL. A negative result does not preclude SARS-CoV-2  infection  and should not be used as the sole basis for treatment or other  patient management decisions.  A negative result may occur with  improper specimen collection / handling, submission of specimen other  than nasopharyngeal swab, presence of viral mutation(s) within the  areas targeted by this assay, and inadequate number of viral copies  (<250 copies / mL). A negative result must be combined with clinical  observations, patient history, and epidemiological information. If result is POSITIVE SARS-CoV-2 target nucleic acids are DETECTED. The SARS-CoV-2 RNA is generally detectable in upper and lower  respiratory specimens dur ing the acute phase of infection.  Positive  results are indicative of active infection with SARS-CoV-2.  Clinical  correlation with patient history and other diagnostic information is  necessary to determine patient infection status.  Positive results do  not rule out bacterial infection or co-infection with other viruses. If result is PRESUMPTIVE POSTIVE SARS-CoV-2 nucleic acids MAY BE PRESENT.   A presumptive positive result was obtained on the submitted specimen  and confirmed on repeat testing.  While 2019 novel coronavirus  (SARS-CoV-2) nucleic acids may be present in the submitted sample  additional confirmatory testing may be necessary for epidemiological  and / or clinical management purposes  to differentiate between  SARS-CoV-2 and other Sarbecovirus currently known to infect humans.  If clinically indicated additional testing with an alternate test  methodology [phone]) is advised. The SARS-CoV-2 RNA is generally  detectable in upper and lower respiratory sp ecimens during the acute  phase of infection. The expected result is Negative. Fact Sheet for Patients:  StrictlyIdeas.no Fact Sheet for Healthcare Providers: BankingDealers.co.za This test is not yet approved or cleared by the [place]  FDA and has been authorized for detection and/or diagnosis of SARS-CoV-2 by FDA under an Emergency Use Authorization (EUA).  This EUA will remain in effect (meaning this test can be used) for the duration of the COVID-19 declaration under Section 564(b)(1) of the Act, 21 U.S.C. section 360bbb-3(b)(1), unless the authorization is terminated or revoked sooner. Performed at [facility], [place] [address], [place], [place] [address]   Urinalysis, Routine w reflex microscopic     Status: Abnormal   Collection Time: 09/09/18  9:10 AM  Result Value Ref Range   Color, Urine YELLOW YELLOW   APPearance HAZY (A) CLEAR   Specific Gravity, Urine 1.016 1.005 - 1.030   pH 5.0 5.0 - 8.0   Glucose, UA NEGATIVE NEGATIVE mg/dL   Hgb urine dipstick NEGATIVE NEGATIVE   Bilirubin Urine NEGATIVE NEGATIVE   Ketones, ur 5 (A) NEGATIVE mg/dL   Protein, ur 30 (A) NEGATIVE mg/dL   Nitrite NEGATIVE NEGATIVE   Leukocytes,Ua LARGE (A) NEGATIVE   RBC / HPF 0-5 0 - 5 RBC/hpf   WBC, UA 6-10 0 - 5 WBC/hpf   Bacteria, UA RARE (A) NONE SEEN   Squamous Epithelial / LPF 0-5 0 - 5   Mucus PRESENT    Hyaline Casts, UA PRESENT     Comment: Performed at [facility], [place] [address], [place], [place] [address]  Troponin I (High Sensitivity)     Status: Abnormal   Collection Time: 09/09/18 10:06 AM  Result Value Ref Range   Troponin I (High Sensitivity) 22 (H) <18 ng/L    Comment: (NOTE) Elevated high sensitivity troponin I (hsTnI) values and significant  changes across serial measurements may suggest ACS but many other  chronic and acute conditions are known to elevate hsTnI results.  Refer to the "Links" section for chest pain algorithms and additional  guidance. Performed at [facility], [place] [address], [place], [place] [address]   Lactic acid, plasma     Status: None   Collection Time: 09/09/18 11:28 AM  Result Value Ref Range   Lactic Acid, Venous 1.1 0.5 - 1.9 mmol/L    Comment: Performed  at [facility] [address], [place], [place] [address]  Troponin I (High Sensitivity)     Status: Abnormal   Collection Time: 09/09/18  3:43 PM  Result Value Ref Range   Troponin I (High Sensitivity) 19 (H) <18 ng/L    Comment: (NOTE) Elevated high sensitivity troponin I (hsTnI) values and significant  changes across serial measurements may suggest ACS but many other  chronic and acute conditions are known to elevate hsTnI results.  Refer to the "Links" section for chest pain algorithms and additional  guidance. Performed at [facility], [place] [address], [place], [place] [address]    Ct Abdomen Pelvis Wo Contrast  Result Date: 09/09/2018 CLINICAL DATA:  Abdominal pain with nausea and vomiting EXAM: CT ABDOMEN AND PELVIS WITHOUT CONTRAST TECHNIQUE: Multidetector CT imaging of the abdomen and pelvis was performed following the standard protocol without IV contrast. Oral contrast was also administered. COMPARISON:  September 15, 2017 and October 27, 2015 FINDINGS: Lower chest: There  are multiple areas of airspace consolidation in the lower lobes consistent with multifocal pneumonia. Areas of consolidation are noted in portions of the right middle and right lower lobes. Opacification on the left is more patchy with areas of ground-glass type opacity in the inferior lingula and anterior segment left lower lobe. There are foci of coronary artery calcification. A small amount of contrast is seen in the distal esophagus. Hepatobiliary: No focal liver lesions are appreciable. Gallbladder wall is not appreciably thickened by CT. There is no evident biliary duct dilatation. Pancreas: There is no pancreatic mass or inflammatory focus. Spleen: No splenic lesions are evident. Adrenals/Urinary Tract: Adrenals bilaterally appear unremarkable. There is a cyst arising from the upper pole of the right kidney measuring 6.7 x 6.6 cm. There is a mass arising from the mid right kidney with a calcified rim  measuring 2.0 x 1.8 cm, stable. There is no demonstrable hydronephrosis on either side. There is no renal or ureteral calculus on either side. Urinary bladder is midline with wall thickness within normal limits. Stomach/Bowel: Stomach is distended with fluid, air, and apparent food material in the dependent portion of the stomach. There is dilatation of most small bowel loops. There is a transition zone in the mid ileal region consistent with small bowel obstruction. Terminal ileum appears normal. No free air or portal venous air. Vascular/Lymphatic: There is no abdominal aortic aneurysm. There are foci of vascular calcification in the aorta and iliac arteries. No adenopathy is evident in the abdomen or pelvis. Reproductive: There are apparent seed implants in the region of the prostate. Prostate and seminal vesicles are normal in size and contour. No pelvic mass is demonstrable. Other: There is fat in each inguinal ring. Question atrophic testis in the left inguinal ring medially. Appendix appears normal. No abscess is evident in the abdomen or pelvis. There is a slight amount of ascites adjacent to the liver. Musculoskeletal: There is degenerative change in the lumbar spine, most notably at L5-S1. There are no blastic or lytic bone lesions. No intramuscular lesions are evident. IMPRESSION: 1. Small bowel obstruction with transition zone in the mid ileal region. No free air or portal venous air is demonstrable. 2.  Multifocal pneumonia. 3. Slight amount of ascites noted adjacent to the liver on the right. 4.  Seed implants in region of prostate. 5. Fat in each inguinal ring. Suspect atrophic testis left inguinal ring. 6.  No abscess in the abdomen or pelvis. 7. Aortic and iliac artery atherosclerosis. Foci of coronary artery calcification noted. Electronically Signed   By: Lowella Grip III M.D.   On: 09/09/2018 13:50   Dg Chest Portable 1 View  Result Date: 09/09/2018 CLINICAL DATA:  Cough and hemoptysis  EXAM: PORTABLE CHEST 1 VIEW COMPARISON:  December 04, 2015 FINDINGS: Port-A-Cath tip is in the superior vena cava. No pneumothorax. There is patchy airspace opacity in the lung bases. Lungs elsewhere are clear. Heart size and pulmonary vascularity are normal. No adenopathy. No bone lesions. IMPRESSION: Bibasilar patchy infiltrate, felt to represent multifocal pneumonia. No adenopathy. Heart size normal. Port-A-Cath tip in superior vena cava. Electronically Signed   By: Lowella Grip III M.D.   On: 09/09/2018 08:25   Dg Abd Portable 1 View  Result Date: 09/09/2018 CLINICAL DATA:  Nasogastric placement. EXAM: PORTABLE ABDOMEN - 1 VIEW COMPARISON:  04/17/2018 FINDINGS: Nasogastric tube tip is in the midportion of the stomach. Mild small bowel dilatation, less than on the recent CT. IMPRESSION: Nasogastric tube tip in the midportion  of the stomach. Decrease in small bowel dilatation. Electronically Signed   By: Nelson Chimes M.D.   On: 09/09/2018 16:12    Pending Labs Unresulted Labs (From admission, onward)    Start     Ordered   09/10/18 1324  Basic metabolic panel  Tomorrow morning,   R     09/09/18 1612   09/10/18 0500  CBC WITH DIFFERENTIAL  Tomorrow morning,   R     09/09/18 1612   09/09/18 1610  Culture, sputum-assessment  Once,   R    Question:  Patient immune status  Answer:  Normal   09/09/18 1612   09/09/18 1610  Strep pneumoniae urinary antigen  Once,   STAT     09/09/18 1612   09/09/18 1051  Blood Culture (routine x 2)  BLOOD CULTURE X 2,   STAT     09/09/18 1051   09/09/18 0923  Urine culture  ONCE - STAT,   STAT     09/09/18 0922          Vitals/Pain Today's Vitals   09/09/18 1315 09/09/18 1400 09/09/18 1445 09/09/18 1624  BP: (!) 163/96 (!) 169/110 (!) 177/106 (!) 162/102  Pulse: 82 80  81  Resp: 15 18 14 10   Temp:      TempSrc:      SpO2: 95% 100%  98%  Weight:      Height:      PainSc:        Isolation Precautions No active  isolations  Medications Medications  cefTRIAXone (ROCEPHIN) 2 g in sodium chloride 0.9 % 100 mL IVPB (0 g Intravenous Stopped 09/09/18 1259)  azithromycin (ZITHROMAX) 500 mg in sodium chloride 0.9 % 250 mL IVPB (0 mg Intravenous Stopped 09/09/18 1259)  hydrALAZINE (APRESOLINE) injection 5 mg (5 mg Intravenous Given 09/09/18 1624)  labetalol (NORMODYNE) injection 5 mg (has no administration in time range)  0.9 %  sodium chloride infusion ( Intravenous New Bag/Given 09/09/18 1621)  heparin injection 5,000 Units (has no administration in time range)  acetaminophen (TYLENOL) tablet 650 mg (has no administration in time range)  ondansetron (ZOFRAN) injection 4 mg (has no administration in time range)  ondansetron (ZOFRAN) injection 4 mg (4 mg Intravenous Given 09/09/18 0816)  HYDROmorphone (DILAUDID) injection 0.5 mg (0.5 mg Intravenous Given 09/09/18 0821)  sodium chloride 0.9 % bolus 500 mL (0 mLs Intravenous Stopped 09/09/18 1140)  ondansetron (ZOFRAN) injection 4 mg (4 mg Intravenous Given 09/09/18 1533)    Mobility walks Low fall risk   Focused Assessments GI   R Recommendations: See Admitting Provider Note  Report given to:   Additional Notes:

## 2018-09-09 NOTE — H&P (Signed)
History and Physical    Jacob [name] ZMO:[id number] DOB: November 27, 1945 DOA: 09/09/2018  PCP: Prince Solian, MD Consultants:  Elisha Ponder - podiatry; Alen Blew - oncology; Trudee Kuster - surgery Patient coming from:  Home - lives with wife and son; NOK: Wife, [phone]  Chief Complaint: Abdominal pain  HPI: Jacob [name] is a 73 y.o. male with medical history significant of stomach cancer in remission; OSA not on CPAP; prostate CA (2013); HTN; HLD; CKD; and BPH presenting with abdominal pain.  He reports that he was constipated and felt n/v.  He has been feeling bloated and nauseated for the last 3-4 days.  He was seen by his PCP yesterday for SOB and hemoptysis and was given an antibiotic for multifocal PNA.  No fever.  No known COVID contacts.  He reports that he does wear a mask when out in public.   ED Course:  Presented with multifocal PNA (COVID negative) and SBO.  2-3 days of cough with sputum, CXR yesterday with PCP with B PNA and given abx.  Today with n/v.  Code sepsis, given small IVF bolus.  Creatinine 2-> 4.35.  CT with SBO.  Surgery called and will place NGT.  Review of Systems: As per HPI; otherwise review of systems reviewed and negative.   Ambulatory Status:  Ambulates without assistance  Past Medical History:  Diagnosis Date  . Anemia    hx iron deficiency  . Anxiety   . Arthritis    gout  . Back pain   . BPH with obstruction/lower urinary tract symptoms   . Chronic kidney disease    "they said I do"  . Constipation   . Depression   . ED (erectile dysfunction)   . Epigastric pain   . GERD (gastroesophageal reflux disease)   . Heartburn   . History of blood transfusion   . History of radiation therapy 11/03/11-12/29/11   prostate  . Hypercholesterolemia   . Hypertension   . Night sweats   . Post-operative nausea and vomiting 04/09/2016  . Prostate cancer ([place]) 08/14/11   Adenocarcinoma,gleason:3+3=6,& 3+4=7,PSA=5.66  . PUD (peptic ulcer  disease)   . Sleep apnea    does not use Cpap but does use O2 @ 2l   . Stomach cancer ([place])   . Ulcer    peptic ulcer hx    Past Surgical History:  Procedure Laterality Date  . colon polyps bx  11/24/06   colon,transverse and rectosigmoid polyps:tubular adenomas and hyperplastic polyps,no high grade dysplasia or malignancy   . duodenal bx  11/24/06   benign  . ESOPHAGOGASTRODUODENOSCOPY N/A 10/27/2015   Procedure: ESOPHAGOGASTRODUODENOSCOPY (EGD);  Surgeon: [name], MD;  Location: [facility];  Service: Endoscopy;  Laterality: N/A;  . EUS N/A 11/08/2015   Procedure: UPPER ENDOSCOPIC ULTRASOUND (EUS) RADIAL;  Surgeon: [name], MD;  Location: [facility];  Service: Endoscopy;  Laterality: N/A;  . GASTRECTOMY N/A 03/25/2016   Procedure: DISTAL GASTRECTOMY;  Surgeon: [name], MD;  Location: [place];  Service: General;  Laterality: N/A;  . gastric bx  11/24/06   chronic active gastritis,with metaplasia and focal changaes of xanthelasma  . GASTROSTOMY N/A 03/25/2016   Procedure: INSERTION OF FEEDING TUBE;  Surgeon: [name], MD;  Location: [place];  Service: General;  Laterality: N/A;  . INSERTION PROSTATE RADIATION SEED  12-29-11  . IR GENERIC HISTORICAL  04/01/2016   IR GJ TUBE CHANGE 04/01/2016 Markus Daft, MD [facility]  . IR GENERIC HISTORICAL  04/05/2016  IR GJ TUBE CHANGE 04/05/2016 Markus Daft, MD [facility]  . LAPAROSCOPIC GASTRECTOMY  03/25/2016   Diagnostic laparoscopy, distal gastrectomy with Billroth 2 reconstruction and gastrojejunostomy tube  . LAPAROSCOPY N/A 03/25/2016   Procedure: DIAGNOSTIC LAPAROSCOPY;  Surgeon: [name], MD;  Location: [place];  Service: General;  Laterality: N/A;  . PORTACATH PLACEMENT Left 12/04/2015   Procedure: INSERTION PORT-A-CATH;  Surgeon: [name], MD;  Location: [place];  Service: General;  Laterality: Left;  . PROSTATE BIOPSY  08/14/11   Adenocarcinoma/volume=58.51cc,gleason=3+3=6 & 3+4=7  . TONSILLECTOMY     73  years old    Social History   Socioeconomic History  . Marital status: Married    Spouse name: [name]  . Number of children: 3  . Years of education: 74  . Highest education level: Not on file  Occupational History  . Occupation: retired    Fish farm manager: Public house manager  Social Needs  . Financial resource strain: Not on file  . Food insecurity    Worry: Not on file    Inability: Not on file  . Transportation needs    Medical: Not on file    Non-medical: Not on file  Tobacco Use  . Smoking status: Former Smoker    Packs/day: 1.50    Years: 30.00    Pack years: 45.00    Types: Cigarettes    Quit date: 04/13/2000    Years since quitting: 18.4  . Smokeless tobacco: Never Used  Substance and Sexual Activity  . Alcohol use: Yes    Alcohol/week: 1.0 standard drinks    Types: 1 Cans of beer per week    Comment: a beer about once a week, less than in his youth  . Drug use: No  . Sexual activity: Not Currently  Lifestyle  . Physical activity    Days per week: Not on file    Minutes per session: Not on file  . Stress: Not on file  Relationships  . Social Herbalist on phone: Not on file    Gets together: Not on file    Attends religious service: Not on file    Active member of club or organization: Not on file    Attends meetings of clubs or organizations: Not on file    Relationship status: Not on file  . Intimate partner violence    Fear of current or ex partner: Not on file    Emotionally abused: Not on file    Physically abused: Not on file    Forced sexual activity: Not on file  Other Topics Concern  . Not on file  Social History Narrative   Patient is married [name]) and lives at home with his wife and one child.   Patient has three children.   Patient is retired.   Patient has a high school education.   Patient is ambi-dextrous.   Patient drinks two cups of coffee about three times a week.     No Known Allergies  Family History  Problem Relation  Age of Onset  . Cancer Mother        NOS  . Alcohol abuse Father   . Alcohol abuse Brother 50  . Cancer Paternal Aunt        NOS  . Colon cancer Neg Hx     Prior to Admission medications   Medication Sig Start Date End Date Taking? Authorizing Provider  acetaminophen (TYLENOL) 500 MG tablet Take 1,000 mg by mouth daily as needed for  moderate pain or headache.   Yes [provider]  buPROPion (WELLBUTRIN XL) 300 MG 24 hr tablet Take 300 mg by mouth daily.  05/10/15  Yes [provider]  calcium carbonate (TUMS - DOSED IN MG ELEMENTAL CALCIUM) 500 MG chewable tablet Chew 3 tablets by mouth 2 (two) times daily as needed for indigestion or heartburn.   Yes [provider]  cefdinir (OMNICEF) 300 MG capsule Take 300 mg by mouth 2 (two) times daily. 09/08/18  Yes [provider]  diazepam (VALIUM) 5 MG tablet Take 5 mg by mouth at bedtime as needed (sleep).    Yes [provider]  furosemide (LASIX) 40 MG tablet Take 80 mg by mouth every other day.    Yes [provider]  gabapentin (NEURONTIN) 800 MG tablet Take 800 mg by mouth at bedtime.    Yes [provider]  hydrALAZINE (APRESOLINE) 25 MG tablet Take 25 mg by mouth 2 (two) times daily. 08/05/18  Yes [provider]  Melatonin 10 MG TABS Take 1 tablet by mouth at bedtime as needed (sleep).    Yes [provider]  Nebivolol HCl (BYSTOLIC) 20 MG TABS Take 20 mg by mouth daily.    Yes [provider]  polycarbophil (FIBERCON) 625 MG tablet Take 1 tablet (625 mg total) by mouth daily. 04/21/16  Yes [name], MD  prochlorperazine (COMPAZINE) 10 MG tablet Take 1 tablet (10 mg total) by mouth every 6 (six) hours as needed for nausea or vomiting. 12/29/17  Yes Wyatt Portela, MD  tamsulosin (FLOMAX) 0.4 MG CAPS capsule TAKE 1 CAPSULE BY MOUTH DAILY Patient taking differently: Take 0.4 mg by mouth daily after supper.  06/19/14  Yes Arloa Koh, MD  traZODone  (DESYREL) 50 MG tablet Take 50 mg by mouth at bedtime.  05/10/15  Yes [provider]  Verapamil HCl CR 300 MG CP24 Take 300 mg by mouth at bedtime.  04/01/15  Yes [provider]  lidocaine-prilocaine (EMLA) cream Apply 1 application topically as needed. Apply to port before chemotherapy. Patient not taking: Reported on 09/09/2018 11/22/15   Wyatt Portela, MD  metoCLOPramide (REGLAN) 5 MG/5ML solution Take 10 mLs (10 mg total) by mouth 4 (four) times daily -  before meals and at bedtime. Patient not taking: Reported on 09/09/2018 04/21/16   [name], MD  ondansetron (ZOFRAN-ODT) 4 MG disintegrating tablet Take 1 tablet (4 mg total) by mouth every 6 (six) hours as needed for nausea. Patient not taking: Reported on 09/09/2018 04/21/16   [name], MD  pantoprazole (PROTONIX) 40 MG tablet Take 1 tablet (40 mg total) by mouth daily. Patient not taking: Reported on 09/09/2018 04/21/16   [name], MD  Water For Irrigation, Sterile (FREE WATER) SOLN Place 100 mLs into feeding tube every 4 (four) hours. Patient not taking: Reported on 09/09/2018 04/21/16   [name], MD    Physical Exam: Vitals:   09/09/18 1445 09/09/18 1624 09/09/18 1630 09/09/18 1740  BP: (!) 177/106 (!) 162/102 (!) 164/97 (!) 158/94  Pulse:  81 82 79  Resp: 14 10 17 16   Temp:      TempSrc:      SpO2:  98% 96% 97%  Weight:      Height:         . General:  Appears calm and comfortable and is NAD . Eyes:  PERRL, EOMI, normal lids, iris . ENT:  grossly normal hearing, lips & tongue, mmm; edentulous . Neck:  no LAD, masses or thyromegaly . Cardiovascular:  RRR, no m/r/g. No LE edema.  Marland Kitchen Respiratory:   RLL rhonchi.  Normal to mildly increased respiratory effort. . Abdomen:  Distended, tympanic, diffusely TTP . Back:   normal alignment, no CVAT . Skin:  no rash or induration seen on limited exam . Musculoskeletal:  grossly normal tone BUE/BLE, good ROM, no bony abnormality . Psychiatric:  grossly  normal mood and affect, speech fluent and appropriate, AOx3 . Neurologic:  CN 2-12 grossly intact, moves all extremities in coordinated fashion, sensation intact    Radiological Exams on Admission: Ct Abdomen Pelvis Wo Contrast  Result Date: 09/09/2018 CLINICAL DATA:  Abdominal pain with nausea and vomiting EXAM: CT ABDOMEN AND PELVIS WITHOUT CONTRAST TECHNIQUE: Multidetector CT imaging of the abdomen and pelvis was performed following the standard protocol without IV contrast. Oral contrast was also administered. COMPARISON:  September 15, 2017 and October 27, 2015 FINDINGS: Lower chest: There are multiple areas of airspace consolidation in the lower lobes consistent with multifocal pneumonia. Areas of consolidation are noted in portions of the right middle and right lower lobes. Opacification on the left is more patchy with areas of ground-glass type opacity in the inferior lingula and anterior segment left lower lobe. There are foci of coronary artery calcification. A small amount of contrast is seen in the distal esophagus. Hepatobiliary: No focal liver lesions are appreciable. Gallbladder wall is not appreciably thickened by CT. There is no evident biliary duct dilatation. Pancreas: There is no pancreatic mass or inflammatory focus. Spleen: No splenic lesions are evident. Adrenals/Urinary Tract: Adrenals bilaterally appear unremarkable. There is a cyst arising from the upper pole of the right kidney measuring 6.7 x 6.6 cm. There is a mass arising from the mid right kidney with a calcified rim measuring 2.0 x 1.8 cm, stable. There is no demonstrable hydronephrosis on either side. There is no renal or ureteral calculus on either side. Urinary bladder is midline with wall thickness within normal limits. Stomach/Bowel: Stomach is distended with fluid, air, and apparent food material in the dependent portion of the stomach. There is dilatation of most small bowel loops. There is a transition zone in the mid  ileal region consistent with small bowel obstruction. Terminal ileum appears normal. No free air or portal venous air. Vascular/Lymphatic: There is no abdominal aortic aneurysm. There are foci of vascular calcification in the aorta and iliac arteries. No adenopathy is evident in the abdomen or pelvis. Reproductive: There are apparent seed implants in the region of the prostate. Prostate and seminal vesicles are normal in size and contour. No pelvic mass is demonstrable. Other: There is fat in each inguinal ring. Question atrophic testis in the left inguinal ring medially. Appendix appears normal. No abscess is evident in the abdomen or pelvis. There is a slight amount of ascites adjacent to the liver. Musculoskeletal: There is degenerative change in the lumbar spine, most notably at L5-S1. There are no blastic or lytic bone lesions. No intramuscular lesions are evident. IMPRESSION: 1. Small bowel obstruction with transition zone in the mid ileal region. No free air or portal venous air is demonstrable. 2.  Multifocal pneumonia. 3. Slight amount of ascites noted adjacent to the liver on the right. 4.  Seed implants in region of prostate. 5. Fat in each inguinal ring. Suspect atrophic testis left inguinal ring. 6.  No abscess in the abdomen or pelvis. 7. Aortic and iliac artery atherosclerosis. Foci of coronary artery calcification noted. Electronically Signed  By: Lowella Grip III M.D.   On: 09/09/2018 13:50   Dg Chest Portable 1 View  Result Date: 09/09/2018 CLINICAL DATA:  Cough and hemoptysis EXAM: PORTABLE CHEST 1 VIEW COMPARISON:  December 04, 2015 FINDINGS: Port-A-Cath tip is in the superior vena cava. No pneumothorax. There is patchy airspace opacity in the lung bases. Lungs elsewhere are clear. Heart size and pulmonary vascularity are normal. No adenopathy. No bone lesions. IMPRESSION: Bibasilar patchy infiltrate, felt to represent multifocal pneumonia. No adenopathy. Heart size normal. Port-A-Cath  tip in superior vena cava. Electronically Signed   By: Lowella Grip III M.D.   On: 09/09/2018 08:25   Dg Abd Portable 1 View  Result Date: 09/09/2018 CLINICAL DATA:  Nasogastric placement. EXAM: PORTABLE ABDOMEN - 1 VIEW COMPARISON:  04/17/2018 FINDINGS: Nasogastric tube tip is in the midportion of the stomach. Mild small bowel dilatation, less than on the recent CT. IMPRESSION: Nasogastric tube tip in the midportion of the stomach. Decrease in small bowel dilatation. Electronically Signed   By: Nelson Chimes M.D.   On: 09/09/2018 16:12    EKG: Independently reviewed.  NSR with rate 90; nonspecific ST changes with no evidence of acute ischemia   Labs on Admission: I have personally reviewed the available labs and imaging studies at the time of the admission.  Pertinent labs:   Na++ 134 CO2 20 Glucose 119 BUN 82/Creatinine 4.35/GFR 15; 38/2.46/29 on 4/2 Albumin 3.0 Bili 1.4 WBC 13.6 Hgb 11.7 COVID negative UA: 5 ketones, large LE, 30 protein, rare bacteria HS troponin 22, 19 Lactate 1.1   Assessment/Plan Principal Problem:   Pneumonia Active Problems:   Essential hypertension   Gastric adenocarcinoma (HCC)   Acute renal failure superimposed on stage 3 chronic kidney disease (HCC)   SBO (small bowel obstruction) (HCC)   PNA -Patient was seen by his PCP yesterday and diagnosed with multifocal PNA -He was given PO Cefdinir -CXR today indicates multifocal PNA, but it is not clear if this is significantly worse and led to ileus or if his SBO was the driving force for today's visit -COVID negative. -CURB-65 score is 2 - will admit the patient to Med Surg. -Pneumonia Severity Index (PSI) is Class 4, 9% mortality. -Corticosteroids have been to shown to low overall mortality rate; risk of ARDS; and need for mechanical ventilation.  This is particularly true in severe PNA (class 3+ PSI).  Will add Solumedrol 40 mg daily for now. -Will start Azithromycin 500 mg daily AND Rocephin  due to no risk factors for MDR cause. -NS @ 75cc/hr -Fever control -Repeat CBC in am -Sputum cultures -Blood cultures -Strep pneumo testing -Will order lower respiratory tract procalcitonin level.  Antibiotics would not be indicated for PCT <0.1 and probably should not be used for < 0.25.  >0.5 indicates infection and >>0.5 indicates more serious disease.  As the procalcitonin level normalizes, it will be reasonable to consider de-escalation of antibiotic coverage.  SBO -Patient with prior h/o abdominal surgery and gastric cancer presenting with acute onset of abdominal pain with n/v, abdominal distention, and CT findings c/w SBO -Will admit on Med Surg -Gen Surg consulted by ER; currently no indication for surgical intervention -This may be PNA-associated ileus vs. SBO - treatment is the same either way -NPO for bowel rest -NG tube in place with improvement in symptoms -IVF hydration -Pain control with morphine  Gastric adenocarcinoma/prostate CA -Gastric CA diagnosed in 10/17, s/p distal gastrectomy in 2018; he remains on active surveillance without evidence of  recurrent disease. -s/p seed implants for prostate CA in 2013; continue Flomax  Acute renal failure on CKD -Patient with acute infection (PNA) in addition to n/v with SBO - so very likely prerenal azotemia -He does have mild ketonuria -He has baseline stage 3-4 CKD but clearly markedly worse now -Will rehydrate and recheck in AM -Avoid nephrotoxic agents  HTN -Will change hydralazine to IV prn -Will change Bystolic to IV Labetalol, standing q6h  OSA -Does not wear CPAP   Note: This patient has been tested and is negative for the novel coronavirus COVID-19.  DVT prophylaxis:SCDs Code Status:  Full - confirmed with patient Family Communication: None present  Disposition Plan:  Home once clinically improved Consults called: Surgery  Admission status: Admit - It is my clinical opinion that admission to INPATIENT is  reasonable and necessary because of the expectation that this patient will require hospital care that crosses at least 2 midnights to treat this condition based on the medical complexity of the problems presented.  Given the aforementioned information, the predictability of an adverse outcome is felt to be significant.    Karmen Bongo MD [facility]   How to contact the [facility] Attending or Consulting provider [place] or covering provider during after hours [place], for this patient?  1. Check the care team in [facility] and look for a) attending/consulting [facility] provider listed and b) the [facility] team listed 2. Log into [email or web address] and use [place]'s universal password to access. If you do not have the password, please contact the hospital operator. 3. Locate the [facility] provider you are looking for under [facility] and page to a number that you can be directly reached. 4. If you still have difficulty reaching the provider, please page the [facility] (Director on Call) for the Hospitalists listed on amion for assistance.   09/09/2018, 6:41 PM

## 2018-09-09 NOTE — Consult Note (Signed)
[facility] Surgery Consult Note  Kees Idrovo [birth year]  [id number].    Requesting MD: Lorin Mercy Chief Complaint/Reason for Consult: sbo  HPI:  Patient is a 73 year old male with hx of gastric cancer s/p distal gastrectomy who presents to [facility] with 4-5 days of abdominal pain. Pain is along prior incision and intermittent. Last BM 2 days ago and was small, non-bloody. Patient has not been passing flatus today. Vomited once yesterday and has been nauseated. Patient also seen by PCP yesterday for SOB and cough with hemoptysis and started on antibiotics for multifocal pneumonia. COVID test negative, no known COVID contacts. Patient denies fever, chills, chest pain, urinary symptoms. PMH otherwise significant for prostate cancer treated with radiation, HTN, GERD, CKD, constipation, BPH. He also reports that he uses an inhaler at home and used to smoke 2 ppd but does not recall being told he has COPD. Denies current tobacco use, occasionally has 1 beer, denies illicit drug use. Patient is retired. No blood thinning medications. NKDA.   ROS: Review of Systems  Constitutional: Negative for chills and fever.  Respiratory: Positive for cough, hemoptysis and shortness of breath.   Cardiovascular: Negative for chest pain and palpitations.  Gastrointestinal: Positive for abdominal pain, constipation, nausea and vomiting. Negative for blood in stool, diarrhea and melena.  Genitourinary: Negative for dysuria, frequency and urgency.  All other systems reviewed and are negative.   Family History  Problem Relation Age of Onset  . Cancer Mother        NOS  . Alcohol abuse Father   . Alcohol abuse Brother 50  . Cancer Paternal Aunt        NOS  . Colon cancer Neg Hx     Past Medical History:  Diagnosis Date  . Anemia    hx iron deficiency  . Anxiety   . Arthritis    gout  . Back pain   . BPH with obstruction/lower urinary tract symptoms   . Chronic kidney disease    "they said I do"   . Constipation   . Depression   . ED (erectile dysfunction)   . Epigastric pain   . GERD (gastroesophageal reflux disease)   . Heartburn   . History of blood transfusion   . History of radiation therapy 11/03/11-12/29/11   prostate  . Hypercholesterolemia   . Hypertension   . Night sweats   . Post-operative nausea and vomiting 04/09/2016  . Prostate cancer ([place]) 08/14/11   Adenocarcinoma,gleason:3+3=6,& 3+4=7,PSA=5.66  . PUD (peptic ulcer disease)   . Sleep apnea    does not use Cpap but does use O2 @ 2l   . Stomach cancer ([place])   . Ulcer    peptic ulcer hx    Past Surgical History:  Procedure Laterality Date  . colon polyps bx  11/24/06   colon,transverse and rectosigmoid polyps:tubular adenomas and hyperplastic polyps,no high grade dysplasia or malignancy   . duodenal bx  11/24/06   benign  . ESOPHAGOGASTRODUODENOSCOPY N/A 10/27/2015   Procedure: ESOPHAGOGASTRODUODENOSCOPY (EGD);  Surgeon: [name], MD;  Location: [facility];  Service: Endoscopy;  Laterality: N/A;  . EUS N/A 11/08/2015   Procedure: UPPER ENDOSCOPIC ULTRASOUND (EUS) RADIAL;  Surgeon: [name], MD;  Location: [facility];  Service: Endoscopy;  Laterality: N/A;  . GASTRECTOMY N/A 03/25/2016   Procedure: DISTAL GASTRECTOMY;  Surgeon: [name], MD;  Location: [place];  Service: General;  Laterality: N/A;  . gastric bx  11/24/06   chronic active gastritis,with metaplasia  and focal changaes of xanthelasma  . GASTROSTOMY N/A 03/25/2016   Procedure: INSERTION OF FEEDING TUBE;  Surgeon: [name], MD;  Location: [place] [facility];  Service: General;  Laterality: N/A;  . INSERTION PROSTATE RADIATION SEED  12-29-11  . IR GENERIC HISTORICAL  04/01/2016   IR GJ TUBE CHANGE 04/01/2016 Markus Daft, MD [facility]  . IR GENERIC HISTORICAL  04/05/2016   IR GJ TUBE CHANGE 04/05/2016 Markus Daft, MD [facility]  . LAPAROSCOPIC GASTRECTOMY  03/25/2016   Diagnostic laparoscopy, distal gastrectomy with Billroth 2  reconstruction and gastrojejunostomy tube  . LAPAROSCOPY N/A 03/25/2016   Procedure: DIAGNOSTIC LAPAROSCOPY;  Surgeon: [name], MD;  Location: [place];  Service: General;  Laterality: N/A;  . PORTACATH PLACEMENT Left 12/04/2015   Procedure: INSERTION PORT-A-CATH;  Surgeon: [name], MD;  Location: [place];  Service: General;  Laterality: Left;  . PROSTATE BIOPSY  08/14/11   Adenocarcinoma/volume=58.51cc,gleason=3+3=6 & 3+4=7  . TONSILLECTOMY     73 years old    Social History:  reports that he quit smoking about 18 years ago. His smoking use included cigarettes. He has a 45.00 pack-year smoking history. He has never used smokeless tobacco. He reports current alcohol use of about 1.0 standard drinks of alcohol per week. He reports that he does not use drugs.  Allergies: No Known Allergies  (Not in a hospital admission)   Blood pressure (!) 177/106, pulse 80, temperature 98.4 F (36.9 C), temperature source Oral, resp. rate 14, height 6\' 2"  (1.88 m), weight 108.9 kg, SpO2 100 %. Physical Exam: Physical Exam Constitutional:      Appearance: He is well-developed. He is obese. He is diaphoretic.  HENT:     Head: Normocephalic and atraumatic.     Right Ear: External ear normal.     Left Ear: External ear normal.     Nose: Nose normal.     Mouth/Throat:     Lips: Pink.     Mouth: Mucous membranes are dry.  Eyes:     General: Lids are normal. No scleral icterus.    Extraocular Movements: Extraocular movements intact.     Conjunctiva/sclera: Conjunctivae normal.  Neck:     Musculoskeletal: Normal range of motion and neck supple.  Cardiovascular:     Rate and Rhythm: Regular rhythm. Tachycardia present.     Pulses:          Radial pulses are 2+ on the right side and 2+ on the left side.       Dorsalis pedis pulses are 2+ on the right side and 2+ on the left side.  Pulmonary:     Effort: Pulmonary effort is normal.     Breath sounds: No stridor. Rales (bilaterally ) present.   Abdominal:     General: Bowel sounds are decreased. There is distension.     Palpations: Abdomen is soft.     Tenderness: There is abdominal tenderness (mild along midline scar). There is no guarding or rebound.     Hernia: No hernia is present.  Musculoskeletal:     Right lower leg: No edema.     Left lower leg: No edema.     Comments: ROM grossly intact in all 4 extremities  Skin:    General: Skin is warm.  Neurological:     Mental Status: He is alert and oriented to person, place, and time.  Psychiatric:        Attention and Perception: Attention and perception normal.  Mood and Affect: Mood and affect normal.        Speech: Speech normal.        Behavior: Behavior is cooperative.     Results for orders placed or performed during the hospital encounter of 09/09/18 (from the past 48 hour(s))  CBC with Differential     Status: Abnormal   Collection Time: 09/09/18  8:05 AM  Result Value Ref Range   WBC 13.6 (H) 4.0 - 10.5 K/uL   RBC 3.59 (L) 4.22 - 5.81 MIL/uL   Hemoglobin 11.7 (L) 13.0 - 17.0 g/dL   HCT 34.2 (L) 39.0 - 52.0 %   MCV 95.3 80.0 - 100.0 fL   MCH 32.6 26.0 - 34.0 pg   MCHC 34.2 30.0 - 36.0 g/dL   RDW 14.6 11.5 - 15.5 %   Platelets 252 150 - 400 K/uL   nRBC 0.0 0.0 - 0.2 %   Neutrophils Relative % 81 %   Neutro Abs 10.9 (H) 1.7 - 7.7 K/uL   Lymphocytes Relative 6 %   Lymphs Abs 0.9 0.7 - 4.0 K/uL   Monocytes Relative 10 %   Monocytes Absolute 1.4 (H) 0.1 - 1.0 K/uL   Eosinophils Relative 1 %   Eosinophils Absolute 0.2 0.0 - 0.5 K/uL   Basophils Relative 0 %   Basophils Absolute 0.1 0.0 - 0.1 K/uL   Immature Granulocytes 2 %   Abs Immature Granulocytes 0.22 (H) 0.00 - 0.07 K/uL    Comment: Performed at [facility], [address], [place], [place] [address]  Comprehensive metabolic panel     Status: Abnormal   Collection Time: 09/09/18  8:05 AM  Result Value Ref Range   Sodium 134 (L) 135 - 145 mmol/L   Potassium 4.4 3.5 - 5.1 mmol/L    Chloride 99 98 - 111 mmol/L   CO2 20 (L) 22 - 32 mmol/L   Glucose, Bld 119 (H) 70 - 99 mg/dL   BUN 82 (H) 8 - 23 mg/dL   Creatinine, Ser 4.35 (H) 0.61 - 1.24 mg/dL   Calcium 10.1 8.9 - 10.3 mg/dL   Total Protein 7.4 6.5 - 8.1 g/dL   Albumin 3.0 (L) 3.5 - 5.0 g/dL   AST 16 15 - 41 U/L   ALT 11 0 - 44 U/L   Alkaline Phosphatase 79 38 - 126 U/L   Total Bilirubin 1.4 (H) 0.3 - 1.2 mg/dL   GFR calc non Af Amer 13 (L) >60 mL/min   GFR calc Af Amer 15 (L) >60 mL/min   Anion gap 15 5 - 15    Comment: Performed at [facility], [place] [address], [place], [place] [address]  Lipase, blood     Status: None   Collection Time: 09/09/18  8:05 AM  Result Value Ref Range   Lipase 39 11 - 51 U/L    Comment: Performed at [facility] [address] [place], [place] [address]  SARS Coronavirus 2 [facility] order, Performed in [address] [facility] hospital lab) Nasopharyngeal Nasopharyngeal Swab     Status: None   Collection Time: 09/09/18  8:10 AM   Specimen: Nasopharyngeal Swab  Result Value Ref Range   SARS Coronavirus 2 NEGATIVE NEGATIVE    Comment: (NOTE) If result is NEGATIVE SARS-CoV-2 target nucleic acids are NOT DETECTED. The SARS-CoV-2 RNA is generally detectable in upper and lower  respiratory specimens during the acute phase of infection. The lowest  concentration of SARS-CoV-2 viral copies this assay  can detect is 250  copies / mL. A negative result does not preclude SARS-CoV-2 infection  and should not be used as the sole basis for treatment or other  patient management decisions.  A negative result may occur with  improper specimen collection / handling, submission of specimen other  than nasopharyngeal swab, presence of viral mutation(s) within the  areas targeted by this assay, and inadequate number of viral copies  (<250 copies / mL). A negative result must be combined with clinical  observations, patient history, and epidemiological information. If result is  POSITIVE SARS-CoV-2 target nucleic acids are DETECTED. The SARS-CoV-2 RNA is generally detectable in upper and lower  respiratory specimens dur ing the acute phase of infection.  Positive  results are indicative of active infection with SARS-CoV-2.  Clinical  correlation with patient history and other diagnostic information is  necessary to determine patient infection status.  Positive results do  not rule out bacterial infection or co-infection with other viruses. If result is PRESUMPTIVE POSTIVE SARS-CoV-2 nucleic acids MAY BE PRESENT.   A presumptive positive result was obtained on the submitted specimen  and confirmed on repeat testing.  While 2019 novel coronavirus  (SARS-CoV-2) nucleic acids may be present in the submitted sample  additional confirmatory testing may be necessary for epidemiological  and / or clinical management purposes  to differentiate between  SARS-CoV-2 and other Sarbecovirus currently known to infect humans.  If clinically indicated additional testing with an alternate test  methodology [phone]) is advised. The SARS-CoV-2 RNA is generally  detectable in upper and lower respiratory sp ecimens during the acute  phase of infection. The expected result is Negative. Fact Sheet for Patients:  StrictlyIdeas.no Fact Sheet for Healthcare Providers: BankingDealers.co.za This test is not yet approved or cleared by the [place] FDA and has been authorized for detection and/or diagnosis of SARS-CoV-2 by FDA under an Emergency Use Authorization (EUA).  This EUA will remain in effect (meaning this test can be used) for the duration of the COVID-19 declaration under Section 564(b)(1) of the Act, 21 U.S.C. section 360bbb-3(b)(1), unless the authorization is terminated or revoked sooner. Performed at [facility], [place] [address], [place], [place] [address]   Urinalysis, Routine w reflex microscopic     Status:  Abnormal   Collection Time: 09/09/18  9:10 AM  Result Value Ref Range   Color, Urine YELLOW YELLOW   APPearance HAZY (A) CLEAR   Specific Gravity, Urine 1.016 1.005 - 1.030   pH 5.0 5.0 - 8.0   Glucose, UA NEGATIVE NEGATIVE mg/dL   Hgb urine dipstick NEGATIVE NEGATIVE   Bilirubin Urine NEGATIVE NEGATIVE   Ketones, ur 5 (A) NEGATIVE mg/dL   Protein, ur 30 (A) NEGATIVE mg/dL   Nitrite NEGATIVE NEGATIVE   Leukocytes,Ua LARGE (A) NEGATIVE   RBC / HPF 0-5 0 - 5 RBC/hpf   WBC, UA 6-10 0 - 5 WBC/hpf   Bacteria, UA RARE (A) NONE SEEN   Squamous Epithelial / LPF 0-5 0 - 5   Mucus PRESENT    Hyaline Casts, UA PRESENT     Comment: Performed at [facility], [place] [address] [place], [place] [address]  Troponin I (High Sensitivity)     Status: Abnormal   Collection Time: 09/09/18 10:06 AM  Result Value Ref Range   Troponin I (High Sensitivity) 22 (H) <18 ng/L    Comment: (NOTE) Elevated high sensitivity troponin I (hsTnI) values and significant  changes across serial measurements may  suggest ACS but many other  chronic and acute conditions are known to elevate hsTnI results.  Refer to the "Links" section for chest pain algorithms and additional  guidance. Performed at [facility], [place] [address] [place], [place] [address]   Lactic acid, plasma     Status: None   Collection Time: 09/09/18 11:28 AM  Result Value Ref Range   Lactic Acid, Venous 1.1 0.5 - 1.9 mmol/L    Comment: Performed at [place] [address], [place], [place] [address]   Ct Abdomen Pelvis Wo Contrast  Result Date: 09/09/2018 CLINICAL DATA:  Abdominal pain with nausea and vomiting EXAM: CT ABDOMEN AND PELVIS WITHOUT CONTRAST TECHNIQUE: Multidetector CT imaging of the abdomen and pelvis was performed following the standard protocol without IV contrast. Oral contrast was also administered. COMPARISON:  September 15, 2017 and October 27, 2015 FINDINGS: Lower chest: There are multiple areas of  airspace consolidation in the lower lobes consistent with multifocal pneumonia. Areas of consolidation are noted in portions of the right middle and right lower lobes. Opacification on the left is more patchy with areas of ground-glass type opacity in the inferior lingula and anterior segment left lower lobe. There are foci of coronary artery calcification. A small amount of contrast is seen in the distal esophagus. Hepatobiliary: No focal liver lesions are appreciable. Gallbladder wall is not appreciably thickened by CT. There is no evident biliary duct dilatation. Pancreas: There is no pancreatic mass or inflammatory focus. Spleen: No splenic lesions are evident. Adrenals/Urinary Tract: Adrenals bilaterally appear unremarkable. There is a cyst arising from the upper pole of the right kidney measuring 6.7 x 6.6 cm. There is a mass arising from the mid right kidney with a calcified rim measuring 2.0 x 1.8 cm, stable. There is no demonstrable hydronephrosis on either side. There is no renal or ureteral calculus on either side. Urinary bladder is midline with wall thickness within normal limits. Stomach/Bowel: Stomach is distended with fluid, air, and apparent food material in the dependent portion of the stomach. There is dilatation of most small bowel loops. There is a transition zone in the mid ileal region consistent with small bowel obstruction. Terminal ileum appears normal. No free air or portal venous air. Vascular/Lymphatic: There is no abdominal aortic aneurysm. There are foci of vascular calcification in the aorta and iliac arteries. No adenopathy is evident in the abdomen or pelvis. Reproductive: There are apparent seed implants in the region of the prostate. Prostate and seminal vesicles are normal in size and contour. No pelvic mass is demonstrable. Other: There is fat in each inguinal ring. Question atrophic testis in the left inguinal ring medially. Appendix appears normal. No abscess is evident in the  abdomen or pelvis. There is a slight amount of ascites adjacent to the liver. Musculoskeletal: There is degenerative change in the lumbar spine, most notably at L5-S1. There are no blastic or lytic bone lesions. No intramuscular lesions are evident. IMPRESSION: 1. Small bowel obstruction with transition zone in the mid ileal region. No free air or portal venous air is demonstrable. 2.  Multifocal pneumonia. 3. Slight amount of ascites noted adjacent to the liver on the right. 4.  Seed implants in region of prostate. 5. Fat in each inguinal ring. Suspect atrophic testis left inguinal ring. 6.  No abscess in the abdomen or pelvis. 7. Aortic and iliac artery atherosclerosis. Foci of coronary artery calcification noted. Electronically Signed   By: Lowella Grip III M.D.  On: 09/09/2018 13:50   Dg Chest Portable 1 View  Result Date: 09/09/2018 CLINICAL DATA:  Cough and hemoptysis EXAM: PORTABLE CHEST 1 VIEW COMPARISON:  December 04, 2015 FINDINGS: Port-A-Cath tip is in the superior vena cava. No pneumothorax. There is patchy airspace opacity in the lung bases. Lungs elsewhere are clear. Heart size and pulmonary vascularity are normal. No adenopathy. No bone lesions. IMPRESSION: Bibasilar patchy infiltrate, felt to represent multifocal pneumonia. No adenopathy. Heart size normal. Port-A-Cath tip in superior vena cava. Electronically Signed   By: Lowella Grip III M.D.   On: 09/09/2018 08:25      Assessment/Plan Hx of gastric cancer - s/p distal gastrectomy in 2018 Dr. Barry Dienes Hx of prostate cancer - s/p radiation AKI on CKD - Cr 4.35, likely secondary to dehydration, IVF Constipation BPH GERD HTN ?COPD - 2ppd smoking hx  Multifocal pneumonia - seen on CXR and CT today, COVID negative, per admitting service  SBO vs ileus - CT with sbo with transition in mid ileal region - hx of prior abdominal surgery so could be sbo vs reactive ileus from PNA - NGT placed with large volume emesis that  appeared feculent - decompress overnight with NGT - film pending to check placement - can initiate sbo protocol tomorrow AM if doing well  FEN: NPO, IVF; NGT to LIWS VTE: SCDs, ok to have chemical prophylaxis from a general surgery standpoint ID: [email or web address]   Brigid Re, [facility] Surgery 09/09/2018, 3:30 PM Pager: [place]: [phone]

## 2018-09-09 NOTE — ED Provider Notes (Signed)
[facility] EMERGENCY DEPARTMENT Provider Note   CSN: [id number] Arrival date & time: 09/09/18  6720     History   Chief Complaint Chief Complaint  Patient presents with  . Abdominal Pain  . Pneumonia    HPI Jacob [name] is a 73 y.o. male with a history of iron deficiency anemia, BPH, CKD, GERD, HLD, HTN, gastric cancer currently in remission presents for evaluation of acute onset, persistent cough for 3 days and abdominal pain for the same amount of time.  He reports that he was seen and evaluated at his PCP yesterday for his complaints, underwent chest x-ray which showed bilateral infiltrates.  He reports that he has had a cough productive of sputum which has a few "brown and red flecks".  He denies any fever, shortness of breath, or chest pain.  He was started on antibiotics yesterday after being seen by his PCP but does not remember the name of the antibiotic.  No recent travel or surgeries, no prior history of DVT or PE, no hormone replacement therapy, no leg swelling.  He reports that for the last 3 days or so he has had some generalized abdominal pain, worse along the midline in the left lower quadrant.  He describes it as a cramping sensation.  It worsens when he lays on his left side.  He has had persistent nausea which appears to be chronic per chart review but he did have one episode of nonbloody nonbilious emesis this morning.  Has not had a bowel movement since Sunday 4 days ago which is unusual for him.  Reports he is passing a little bit of flatus.  Denies urinary symptoms.  Has been taking his home medications but otherwise has not tried anything for his symptoms.     The history is provided by the patient.    Past Medical History:  Diagnosis Date  . Anemia    hx iron deficiency  . Anxiety   . Arthritis    gout  . Back pain   . BPH with obstruction/lower urinary tract symptoms   . Chronic kidney disease    "they said I do"  .  Constipation   . Depression   . ED (erectile dysfunction)   . Epigastric pain   . GERD (gastroesophageal reflux disease)   . Heartburn   . History of blood transfusion   . History of radiation therapy 11/03/11-12/29/11   prostate  . Hypercholesterolemia   . Hypertension   . Night sweats   . Post-operative nausea and vomiting 04/09/2016  . Prostate cancer ([place]) 08/14/11   Adenocarcinoma,gleason:3+3=6,& 3+4=7,PSA=5.66  . PUD (peptic ulcer disease)   . Sleep apnea    does not use Cpap but does use O2 @ 2l   . Stomach cancer ([place])   . Ulcer    peptic ulcer hx    Patient Active Problem List   Diagnosis Date Noted  . Pneumonia 09/09/2018  . Port-A-Cath in place 11/23/2017  . Post-operative nausea and vomiting 04/09/2016  . 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 Genetic testing 12/19/2015  . Stomach cancer ([place])   . Gastric adenocarcinoma ([place])   . Gastric mass   . GI bleed 10/26/2015  . Gastrointestinal hemorrhage   . PUD (peptic ulcer disease)   . 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 Nocturnal hypoxemia 11/04/2013  . Noncompliance with treatment 09/02/2013  . Chronic kidney disease, stage II (mild) 08/30/2013  . Morbid obesity ([place]) 07/26/2013  .  Fatigue 07/26/2013  . Prostate cancer ([place]) 08/14/2011  . Iron deficiency anemia 03/19/2007  . DEPRESSION 03/19/2007  . Essential hypertension 03/19/2007  . NEOPLASM, BENIGN, STOMACH 11/23/2006  . POLYP, COLON 11/23/2006  . INTERNAL HEMORRHOIDS 11/23/2006  . GASTRITIS 11/23/2006    Past Surgical History:  Procedure Laterality Date  . colon polyps bx  11/24/06   colon,transverse and rectosigmoid polyps:tubular adenomas and hyperplastic polyps,no high grade dysplasia or malignancy   . duodenal bx  11/24/06   benign  . ESOPHAGOGASTRODUODENOSCOPY N/A 10/27/2015   Procedure: ESOPHAGOGASTRODUODENOSCOPY (EGD);  Surgeon: [name], MD;  Location: [facility];  Service: Endoscopy;  Laterality:  N/A;  . EUS N/A 11/08/2015   Procedure: UPPER ENDOSCOPIC ULTRASOUND (EUS) RADIAL;  Surgeon: [name], MD;  Location: [facility];  Service: Endoscopy;  Laterality: N/A;  . GASTRECTOMY N/A 03/25/2016   Procedure: DISTAL GASTRECTOMY;  Surgeon: [name], MD;  Location: [facility];  Service: General;  Laterality: N/A;  . gastric bx  11/24/06   chronic active gastritis,with metaplasia and focal changaes of xanthelasma  . GASTROSTOMY N/A 03/25/2016   Procedure: INSERTION OF FEEDING TUBE;  Surgeon: [name], MD;  Location: [facility];  Service: General;  Laterality: N/A;  . INSERTION PROSTATE RADIATION SEED  12-29-11  . IR GENERIC HISTORICAL  04/01/2016   IR GJ TUBE CHANGE 04/01/2016 Markus Daft, MD [facility]  . IR GENERIC HISTORICAL  04/05/2016   IR GJ TUBE CHANGE 04/05/2016 Markus Daft, MD [facility]  . LAPAROSCOPIC GASTRECTOMY  03/25/2016   Diagnostic laparoscopy, distal gastrectomy with Billroth 2 reconstruction and gastrojejunostomy tube  . LAPAROSCOPY N/A 03/25/2016   Procedure: DIAGNOSTIC LAPAROSCOPY;  Surgeon: [name], MD;  Location: [facility];  Service: General;  Laterality: N/A;  . PORTACATH PLACEMENT Left 12/04/2015   Procedure: INSERTION PORT-A-CATH;  Surgeon: [name], MD;  Location: [facility];  Service: General;  Laterality: Left;  . PROSTATE BIOPSY  08/14/11   Adenocarcinoma/volume=58.51cc,gleason=3+3=6 & 3+4=7  . TONSILLECTOMY     73 years old        Home Medications    Prior to Admission medications   Medication Sig Start Date End Date Taking? Authorizing Provider  acetaminophen (TYLENOL) 500 MG tablet Take 1,000 mg by mouth daily as needed for moderate pain or headache.   Yes [provider]  buPROPion (WELLBUTRIN XL) 300 MG 24 hr tablet Take 300 mg by mouth daily.  05/10/15  Yes [provider]  calcium carbonate (TUMS - DOSED IN MG ELEMENTAL CALCIUM) 500 MG chewable tablet Chew 3 tablets by mouth 2 (two) times daily as needed for indigestion or  heartburn.   Yes [provider]  cefdinir (OMNICEF) 300 MG capsule Take 300 mg by mouth 2 (two) times daily. 09/08/18  Yes [provider]  furosemide (LASIX) 40 MG tablet Take 80 mg by mouth every other day.    Yes [provider]  gabapentin (NEURONTIN) 800 MG tablet Take 800 mg by mouth at bedtime.    Yes [provider]  hydrALAZINE (APRESOLINE) 25 MG tablet Take 25 mg by mouth 2 (two) times daily. 08/05/18  Yes [provider]  Melatonin 10 MG TABS Take 1 tablet by mouth at bedtime as needed (sleep).    Yes [provider]  Nebivolol HCl (BYSTOLIC) 20 MG TABS Take 20 mg by mouth daily.    Yes [provider]  polycarbophil (FIBERCON) 625 MG tablet Take 1 tablet (625 mg total) by mouth daily. 04/21/16  Yes  [name], MD  prochlorperazine (COMPAZINE) 10 MG tablet Take 1 tablet (10 mg total) by mouth every 6 (six) hours as needed for nausea or vomiting. 12/29/17  Yes Wyatt Portela, MD  tamsulosin (FLOMAX) 0.4 MG CAPS capsule TAKE 1 CAPSULE BY MOUTH DAILY Patient taking differently: Take 0.4 mg by mouth daily after supper.  06/19/14  Yes Arloa Koh, MD  traZODone (DESYREL) 50 MG tablet Take 50 mg by mouth at bedtime.  05/10/15  Yes [provider]  Verapamil HCl CR 300 MG CP24 Take 300 mg by mouth at bedtime.  04/01/15  Yes [provider]    Family History Family History  Problem Relation Age of Onset  . Cancer Mother        NOS  . Alcohol abuse Father   . Alcohol abuse Brother 50  . Cancer Paternal Aunt        NOS  . Colon cancer Neg Hx     Social History Social History   Tobacco Use  . Smoking status: Former Smoker    Packs/day: 1.50    Years: 30.00    Pack years: 45.00    Types: Cigarettes    Quit date: 04/13/2000    Years since quitting: 18.4  . Smokeless tobacco: Never Used  Substance Use Topics  . Alcohol use: Yes    Alcohol/week: 1.0 standard drinks    Types: 1 Cans of beer per week     Comment: a beer about once a week, less than in his youth  . Drug use: No     Allergies   Patient has no known allergies.   Review of Systems Review of Systems  Constitutional: Negative for chills and fever.  Respiratory: Positive for cough. Negative for shortness of breath.   Cardiovascular: Negative for chest pain and leg swelling.  Gastrointestinal: Positive for abdominal distention, abdominal pain, constipation, nausea and vomiting.  All other systems reviewed and are negative.    Physical Exam Updated Vital Signs BP (!) 177/106   Pulse 80   Temp 98.4 F (36.9 C) (Oral)   Resp 14   Ht 6\' 2"  (1.88 m)   Wt 108.9 kg   SpO2 100%   BMI 30.81 kg/m   Physical Exam Vitals signs and nursing note reviewed.  Constitutional:      General: He is not in acute distress.    Appearance: He is well-developed.  HENT:     Head: Normocephalic and atraumatic.  Eyes:     General:        Right eye: No discharge.        Left eye: No discharge.     Conjunctiva/sclera: Conjunctivae normal.  Neck:     Vascular: No JVD.     Trachea: No tracheal deviation.  Cardiovascular:     Rate and Rhythm: Normal rate and regular rhythm.  Pulmonary:     Comments: Tachypnea, bibasilar crackles noted.  Peaking full sentences without difficulty, SPO2 saturations 92 to 96% on room air Abdominal:     General: Abdomen is protuberant. A surgical scar is present. Bowel sounds are decreased. There is no distension.     Palpations: Abdomen is soft.     Tenderness: There is generalized abdominal tenderness and tenderness in the left lower quadrant. There is no right CVA tenderness, left CVA tenderness, guarding or rebound.  Skin:    General: Skin is warm and dry.     Findings: No erythema.  Neurological:     Mental Status:  He is alert.  Psychiatric:        Behavior: Behavior normal.      ED Treatments / Results  Labs (all labs ordered are listed, but only abnormal results are displayed) Labs  Reviewed  CBC WITH DIFFERENTIAL/PLATELET - Abnormal; Notable for the following components:      Result Value   WBC 13.6 (*)    RBC 3.59 (*)    Hemoglobin 11.7 (*)    HCT 34.2 (*)    Neutro Abs 10.9 (*)    Monocytes Absolute 1.4 (*)    Abs Immature Granulocytes 0.22 (*)    All other components within normal limits  COMPREHENSIVE METABOLIC PANEL - Abnormal; Notable for the following components:   Sodium 134 (*)    CO2 20 (*)    Glucose, Bld 119 (*)    BUN 82 (*)    Creatinine, Ser 4.35 (*)    Albumin 3.0 (*)    Total Bilirubin 1.4 (*)    GFR calc non Af Amer 13 (*)    GFR calc Af Amer 15 (*)    All other components within normal limits  URINALYSIS, ROUTINE W REFLEX MICROSCOPIC - Abnormal; Notable for the following components:   APPearance HAZY (*)    Ketones, ur 5 (*)    Protein, ur 30 (*)    Leukocytes,Ua LARGE (*)    Bacteria, UA RARE (*)    All other components within normal limits  TROPONIN I (HIGH SENSITIVITY) - Abnormal; Notable for the following components:   Troponin I (High Sensitivity) 22 (*)    All other components within normal limits  SARS CORONAVIRUS 2 (HOSPITAL ORDER, [name])  URINE CULTURE  CULTURE, BLOOD (ROUTINE X 2)  CULTURE, BLOOD (ROUTINE X 2)  EXPECTORATED SPUTUM ASSESSMENT W REFEX TO RESP CULTURE  LIPASE, BLOOD  LACTIC ACID, PLASMA  STREP PNEUMONIAE URINARY ANTIGEN  TROPONIN I (HIGH SENSITIVITY)    EKG EKG Interpretation  Date/Time:  Thursday September 09 2018 08:18:08 EDT Ventricular Rate:  90 PR Interval:    QRS Duration: 85 QT Interval:  352 QTC Calculation: 431 R Axis:   51 Text Interpretation:  Sinus rhythm Ventricular premature complex Abnormal R-wave progression, early transition baseline wander.  no clear st elevation Confirmed by Davonna Belling [phone]) on 09/09/2018 8:44:29 AM   Radiology Ct Abdomen Pelvis Wo Contrast  Result Date: 09/09/2018 CLINICAL DATA:  Abdominal pain with nausea and vomiting  EXAM: CT ABDOMEN AND PELVIS WITHOUT CONTRAST TECHNIQUE: Multidetector CT imaging of the abdomen and pelvis was performed following the standard protocol without IV contrast. Oral contrast was also administered. COMPARISON:  September 15, 2017 and October 27, 2015 FINDINGS: Lower chest: There are multiple areas of airspace consolidation in the lower lobes consistent with multifocal pneumonia. Areas of consolidation are noted in portions of the right middle and right lower lobes. Opacification on the left is more patchy with areas of ground-glass type opacity in the inferior lingula and anterior segment left lower lobe. There are foci of coronary artery calcification. A small amount of contrast is seen in the distal esophagus. Hepatobiliary: No focal liver lesions are appreciable. Gallbladder wall is not appreciably thickened by CT. There is no evident biliary duct dilatation. Pancreas: There is no pancreatic mass or inflammatory focus. Spleen: No splenic lesions are evident. Adrenals/Urinary Tract: Adrenals bilaterally appear unremarkable. There is a cyst arising from the upper pole of the right kidney measuring 6.7 x 6.6 cm. There is a mass arising  from the mid right kidney with a calcified rim measuring 2.0 x 1.8 cm, stable. There is no demonstrable hydronephrosis on either side. There is no renal or ureteral calculus on either side. Urinary bladder is midline with wall thickness within normal limits. Stomach/Bowel: Stomach is distended with fluid, air, and apparent food material in the dependent portion of the stomach. There is dilatation of most small bowel loops. There is a transition zone in the mid ileal region consistent with small bowel obstruction. Terminal ileum appears normal. No free air or portal venous air. Vascular/Lymphatic: There is no abdominal aortic aneurysm. There are foci of vascular calcification in the aorta and iliac arteries. No adenopathy is evident in the abdomen or pelvis. Reproductive:  There are apparent seed implants in the region of the prostate. Prostate and seminal vesicles are normal in size and contour. No pelvic mass is demonstrable. Other: There is fat in each inguinal ring. Question atrophic testis in the left inguinal ring medially. Appendix appears normal. No abscess is evident in the abdomen or pelvis. There is a slight amount of ascites adjacent to the liver. Musculoskeletal: There is degenerative change in the lumbar spine, most notably at L5-S1. There are no blastic or lytic bone lesions. No intramuscular lesions are evident. IMPRESSION: 1. Small bowel obstruction with transition zone in the mid ileal region. No free air or portal venous air is demonstrable. 2.  Multifocal pneumonia. 3. Slight amount of ascites noted adjacent to the liver on the right. 4.  Seed implants in region of prostate. 5. Fat in each inguinal ring. Suspect atrophic testis left inguinal ring. 6.  No abscess in the abdomen or pelvis. 7. Aortic and iliac artery atherosclerosis. Foci of coronary artery calcification noted. Electronically Signed   By: Lowella Grip III M.D.   On: 09/09/2018 13:50   Dg Chest Portable 1 View  Result Date: 09/09/2018 CLINICAL DATA:  Cough and hemoptysis EXAM: PORTABLE CHEST 1 VIEW COMPARISON:  December 04, 2015 FINDINGS: Port-A-Cath tip is in the superior vena cava. No pneumothorax. There is patchy airspace opacity in the lung bases. Lungs elsewhere are clear. Heart size and pulmonary vascularity are normal. No adenopathy. No bone lesions. IMPRESSION: Bibasilar patchy infiltrate, felt to represent multifocal pneumonia. No adenopathy. Heart size normal. Port-A-Cath tip in superior vena cava. Electronically Signed   By: Lowella Grip III M.D.   On: 09/09/2018 08:25   Dg Abd Portable 1 View  Result Date: 09/09/2018 CLINICAL DATA:  Nasogastric placement. EXAM: PORTABLE ABDOMEN - 1 VIEW COMPARISON:  04/17/2018 FINDINGS: Nasogastric tube tip is in the midportion of the  stomach. Mild small bowel dilatation, less than on the recent CT. IMPRESSION: Nasogastric tube tip in the midportion of the stomach. Decrease in small bowel dilatation. Electronically Signed   By: Nelson Chimes M.D.   On: 09/09/2018 16:12    Procedures .Critical Care Performed by: Renita Papa, PA-C Authorized by: Renita Papa, PA-C   Critical care provider statement:    Critical care time (minutes):  45   Critical care was necessary to treat or prevent imminent or life-threatening deterioration of the following conditions:  Sepsis and renal failure   Critical care was time spent personally by me on the following activities:  Discussions with consultants, evaluation of patient's response to treatment, examination of patient, ordering and performing treatments and interventions, ordering and review of laboratory studies, ordering and review of radiographic studies, pulse oximetry, re-evaluation of patient's condition, obtaining history from patient or surrogate and  review of old charts   (including critical care time)  Medications Ordered in ED Medications  cefTRIAXone (ROCEPHIN) 2 g in sodium chloride 0.9 % 100 mL IVPB (0 g Intravenous Stopped 09/09/18 1259)  azithromycin (ZITHROMAX) 500 mg in sodium chloride 0.9 % 250 mL IVPB (0 mg Intravenous Stopped 09/09/18 1259)  hydrALAZINE (APRESOLINE) injection 5 mg (has no administration in time range)  labetalol (NORMODYNE) injection 5 mg (has no administration in time range)  0.9 %  sodium chloride infusion (has no administration in time range)  heparin injection 5,000 Units (has no administration in time range)  acetaminophen (TYLENOL) tablet 650 mg (has no administration in time range)  ondansetron (ZOFRAN) injection 4 mg (has no administration in time range)  ondansetron (ZOFRAN) injection 4 mg (4 mg Intravenous Given 09/09/18 0816)  HYDROmorphone (DILAUDID) injection 0.5 mg (0.5 mg Intravenous Given 09/09/18 0821)  sodium chloride 0.9 % bolus  500 mL (0 mLs Intravenous Stopped 09/09/18 1140)  ondansetron (ZOFRAN) injection 4 mg (4 mg Intravenous Given 09/09/18 1533)     Initial Impression / Assessment and Plan / ED Course  I have reviewed the triage vital signs and the nursing notes.  Pertinent labs & imaging results that were available during my care of the patient were reviewed by me and considered in my medical decision making (see chart for details).       Jocsan Coaxum was evaluated in Emergency Department on 09/09/2018 for the symptoms described in the history of present illness. He was evaluated in the context of the global COVID-19 pandemic, which necessitated consideration that the patient might be at risk for infection with the SARS-CoV-2 virus that causes COVID-19. Institutional protocols and algorithms that pertain to the evaluation of patients at risk for COVID-19 are in a state of rapid change based on information released by regulatory bodies including the CDC and federal and state organizations. These policies and algorithms were followed during the patient's care in the ED.   Patient presenting for evaluation of worsening cough/hemoptysis, abdominal distention and pain with nausea and vomiting.  He is afebrile, persistently hypertensive in the ED became hypoxic with SPO2 saturations 89% on room air requiring 2 L nasal cannula supplemental oxygen with improvement.  Recently diagnosed with pneumonia and started on antibiotics with his PCP yesterday but he does not remember the name of the antibiotic.  Chest x-ray today shows findings consistent with multifocal pneumonia.  Lab work reviewed by me shows leukocytosis, mild anemia, worsening BUN and creatinine suggesting AKI.  His UA is suggestive of dehydration and possible UTI, will culture.  Initial troponin mildly elevated, could be troponin leak secondary to renal insufficiency but we will trend this.  COVID test negative. CT of the abdomen and pelvis shows small bowel  obstruction with transition zone in the mid ileal region with no free air or portal venous air.   CONSULT: Spoke with Claiborne Billings, [place] with General Surgery; they will see the patient in consultation while admitted to the hospital.  NG tube placed.  Spoke with Dr. Lorin Mercy with [facility] hospitalist service who agrees to assume care of patient and bring the patient in the hospital for further evaluation and management.  Final Clinical Impressions(s) / ED Diagnoses   Final diagnoses:  Sepsis with acute renal failure without septic shock, due to unspecified organism, unspecified acute renal failure type ([place])  SBO (small bowel obstruction) ([place])  Multifocal pneumonia  Hypoxia    ED Discharge Orders    None  Debroah Baller 09/09/18 1623    Davonna Belling, MD 09/10/18 1517

## 2018-09-09 NOTE — ED Notes (Signed)
Patient transported to CT 

## 2018-09-09 NOTE — ED Triage Notes (Signed)
Pt came in by [facility] for abd pain & stated it started last night. Pt also stated that he went to [facility] yesterday due to spitting up blood & was diagnosed with pneumonia & started on a PO ABT & sent home. As of this morning his abd continues to hurt rated 8/10 in all quadrants. Upon arrival pt was afebrile (98.4), VSS, endorses nausea but denies vomiting. BS hypoactive x4 quadrants & pt stated his LBM was 4 days ago.

## 2018-09-10 ENCOUNTER — Inpatient Hospital Stay (HOSPITAL_COMMUNITY): Payer: Medicare Other

## 2018-09-10 DIAGNOSIS — D72829 Elevated white blood cell count, unspecified: Secondary | ICD-10-CM

## 2018-09-10 DIAGNOSIS — D649 Anemia, unspecified: Secondary | ICD-10-CM

## 2018-09-10 DIAGNOSIS — J189 Pneumonia, unspecified organism: Principal | ICD-10-CM

## 2018-09-10 LAB — CBC WITH DIFFERENTIAL/PLATELET
Abs Immature Granulocytes: 0.28 10*3/uL — ABNORMAL HIGH (ref 0.00–0.07)
Basophils Absolute: 0 10*3/uL (ref 0.0–0.1)
Basophils Relative: 0 %
Eosinophils Absolute: 0 10*3/uL (ref 0.0–0.5)
Eosinophils Relative: 0 %
HCT: 30 % — ABNORMAL LOW (ref 39.0–52.0)
Hemoglobin: 10.2 g/dL — ABNORMAL LOW (ref 13.0–17.0)
Immature Granulocytes: 2 %
Lymphocytes Relative: 4 %
Lymphs Abs: 0.5 10*3/uL — ABNORMAL LOW (ref 0.7–4.0)
MCH: 32 pg (ref 26.0–34.0)
MCHC: 34 g/dL (ref 30.0–36.0)
MCV: 94 fL (ref 80.0–100.0)
Monocytes Absolute: 0.3 10*3/uL (ref 0.1–1.0)
Monocytes Relative: 3 %
Neutro Abs: 10.9 10*3/uL — ABNORMAL HIGH (ref 1.7–7.7)
Neutrophils Relative %: 91 %
Platelets: 209 10*3/uL (ref 150–400)
RBC: 3.19 MIL/uL — ABNORMAL LOW (ref 4.22–5.81)
RDW: 14.6 % (ref 11.5–15.5)
WBC: 11.9 10*3/uL — ABNORMAL HIGH (ref 4.0–10.5)
nRBC: 0 % (ref 0.0–0.2)

## 2018-09-10 LAB — BASIC METABOLIC PANEL
Anion gap: 13 (ref 5–15)
BUN: 78 mg/dL — ABNORMAL HIGH (ref 8–23)
CO2: 18 mmol/L — ABNORMAL LOW (ref 22–32)
Calcium: 9.6 mg/dL (ref 8.9–10.3)
Chloride: 104 mmol/L (ref 98–111)
Creatinine, Ser: 3.39 mg/dL — ABNORMAL HIGH (ref 0.61–1.24)
GFR calc Af Amer: 20 mL/min — ABNORMAL LOW (ref >60)
GFR calc non Af Amer: 17 mL/min — ABNORMAL LOW (ref >60)
Glucose, Bld: 118 mg/dL — ABNORMAL HIGH (ref 70–99)
Potassium: 5.1 mmol/L (ref 3.5–5.1)
Sodium: 135 mmol/L (ref 135–145)

## 2018-09-10 LAB — HEPATIC FUNCTION PANEL
ALT: 11 U/L (ref 0–44)
AST: 16 U/L (ref 15–41)
Albumin: 2.5 g/dL — ABNORMAL LOW (ref 3.5–5.0)
Alkaline Phosphatase: 62 U/L (ref 38–126)
Bilirubin, Direct: 0.1 mg/dL (ref 0.0–0.2)
Indirect Bilirubin: 0.6 mg/dL (ref 0.3–0.9)
Total Bilirubin: 0.7 mg/dL (ref 0.3–1.2)
Total Protein: 6.6 g/dL (ref 6.5–8.1)

## 2018-09-10 LAB — PHOSPHORUS: Phosphorus: 5.2 mg/dL — ABNORMAL HIGH (ref 2.5–4.6)

## 2018-09-10 LAB — MAGNESIUM: Magnesium: 2.3 mg/dL (ref 1.7–2.4)

## 2018-09-10 LAB — STREP PNEUMONIAE URINARY ANTIGEN: Strep Pneumo Urinary Antigen: NEGATIVE

## 2018-09-10 IMAGING — DX PORTABLE ABDOMEN - 1 VIEW
2 series · 2 of 2 positions shown · non-contrast
Comparison: [DATE]

CLINICAL DATA: Nasogastric tube placement

EXAM:
PORTABLE ABDOMEN - 1 VIEW

[abdomen kub (1 of 2)]
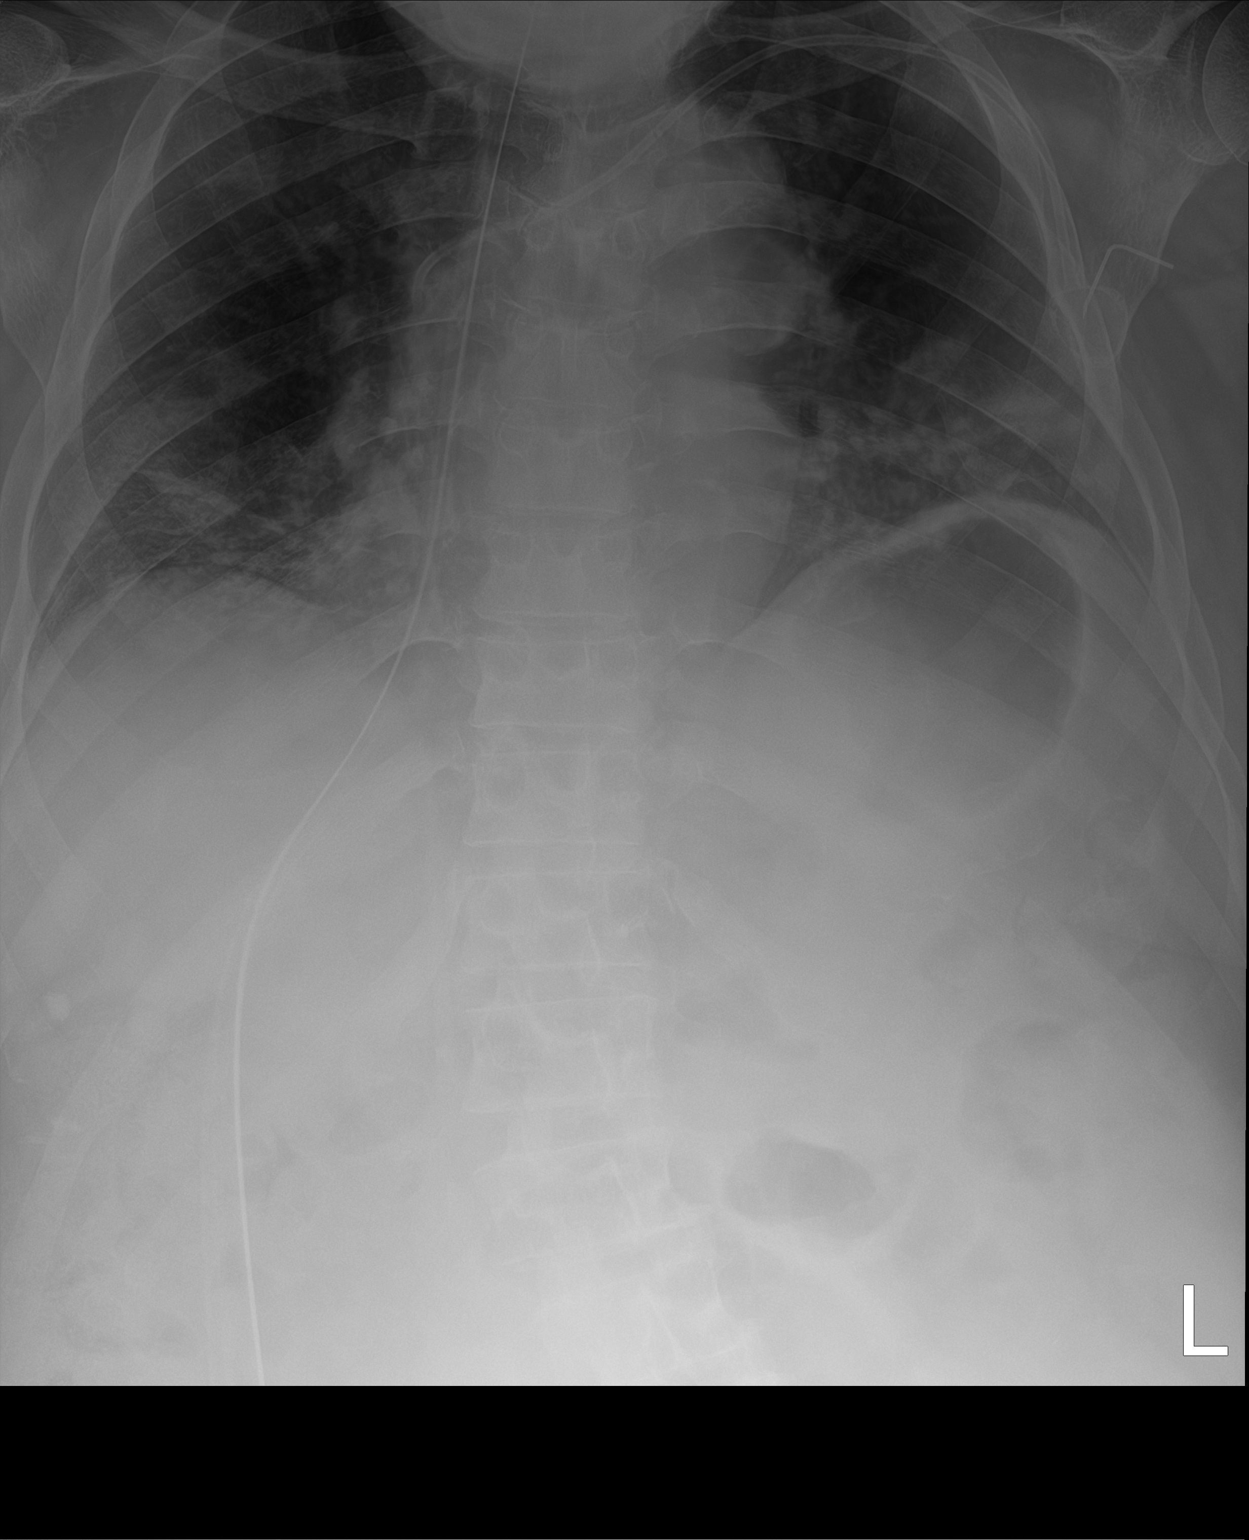

[abdomen kub (2 of 2)]
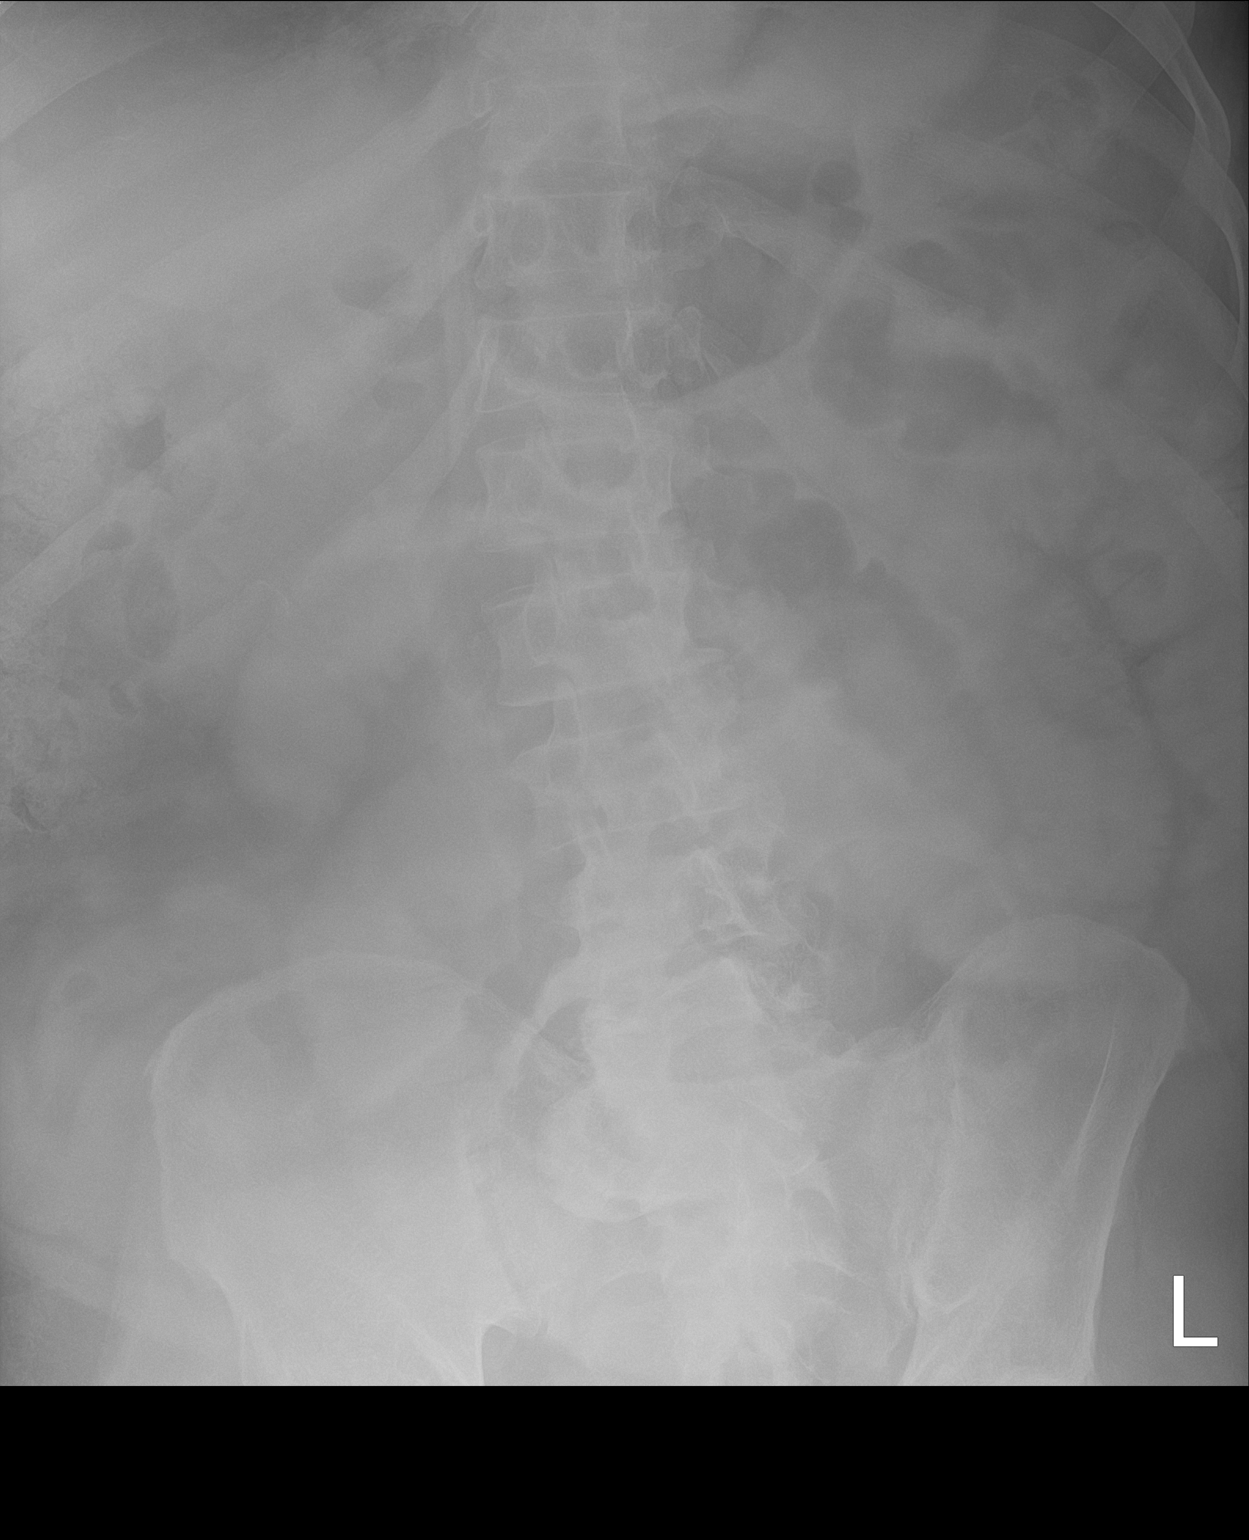

[2 of 2 positions shown; findings below may reference images not displayed]

FINDINGS: The bowel gas pattern is normal. No radio-opaque calculi or other
significant radiographic abnormality are seen.
IMPRESSION: No nasogastric tube is visualized. It may be coiled in the
esophagus.

## 2018-09-10 IMAGING — DX PORTABLE ABDOMEN - 1 VIEW
1 series · 1 of 1 positions shown · non-contrast
Comparison: [DATE]

CLINICAL DATA: NG tube placement

EXAM:
PORTABLE ABDOMEN - 1 VIEW

[abdomen kub]
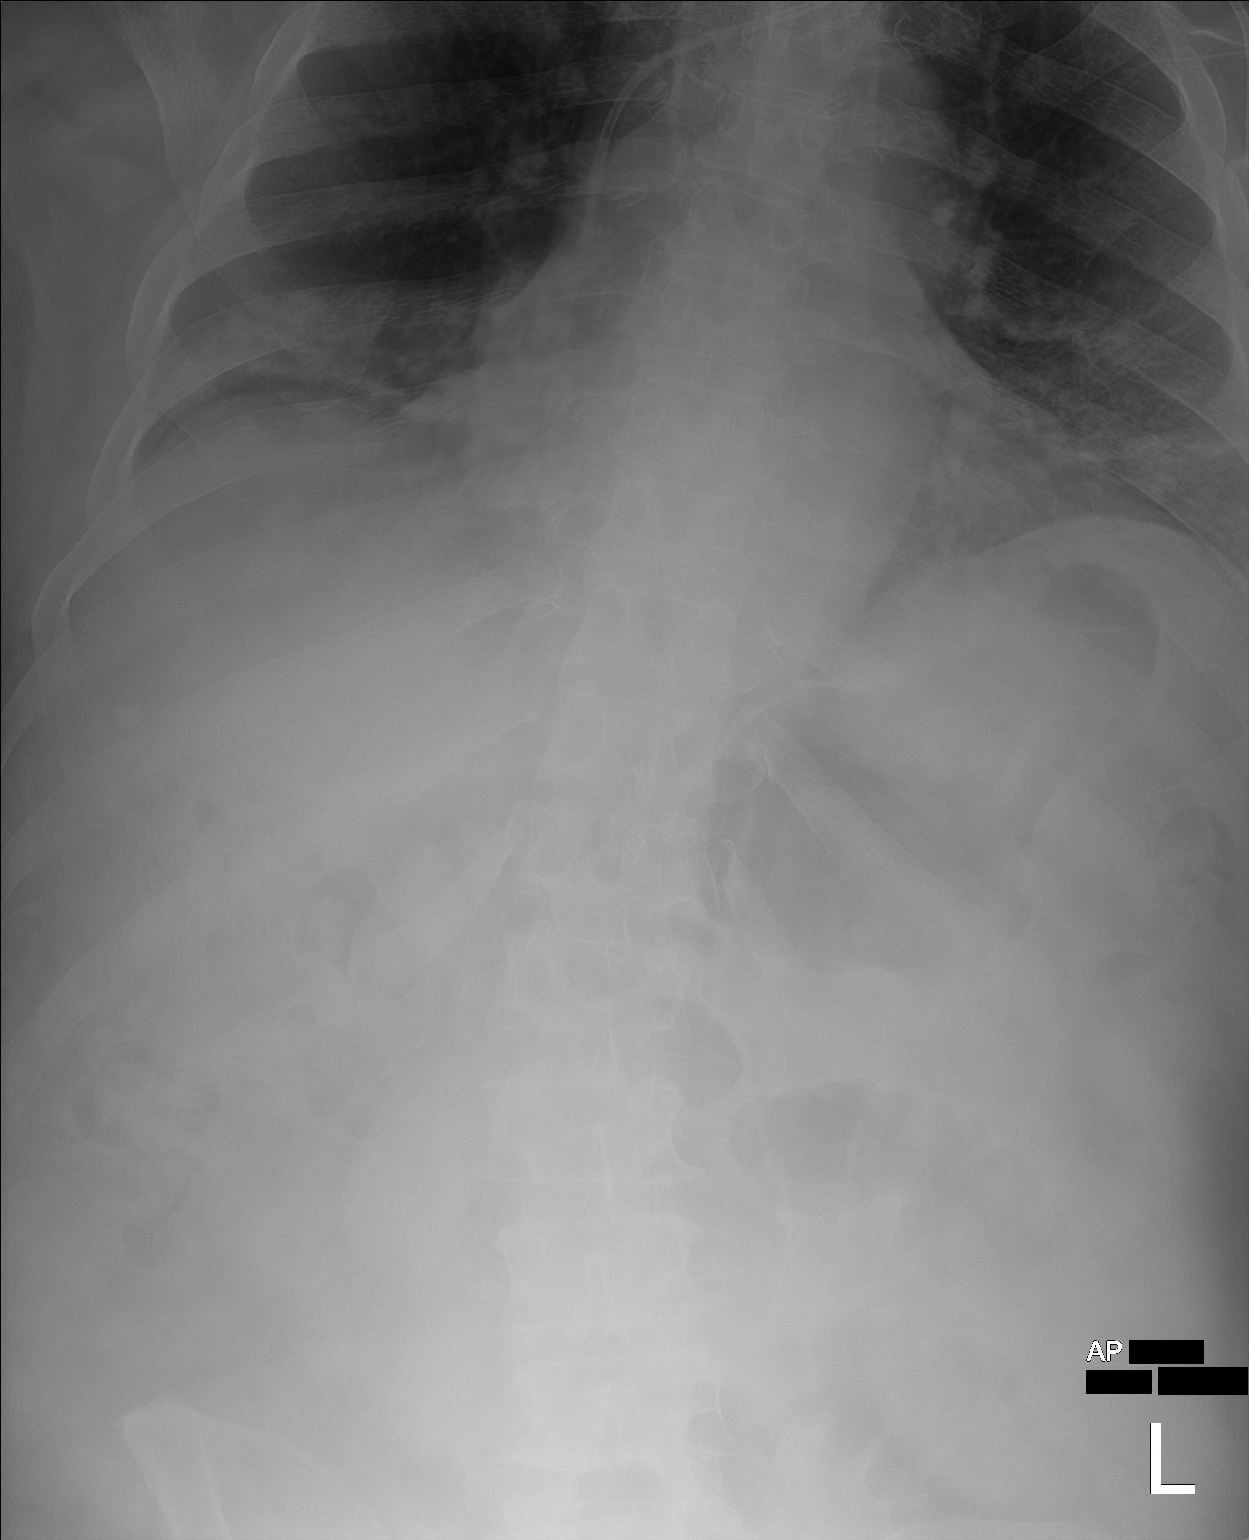

[1 of 1 positions shown; findings below may reference images not displayed]

FINDINGS: NG tube is not visualized in the mid to lower chest or abdomen.
Bibasilar atelectasis. Nonobstructive bowel-gas crash that mildly
prominent left abdominal small bowel loops again noted.
IMPRESSION: No NG tube visualized.

## 2018-09-10 IMAGING — DX PORTABLE CHEST - 1 VIEW
1 series · 1 of 1 positions shown · non-contrast
Comparison: [DATE] and earlier.

CLINICAL DATA: 72-year-old male with shortness of breath and
abdominal pain.

EXAM:
PORTABLE CHEST 1 VIEW

[chest ap]
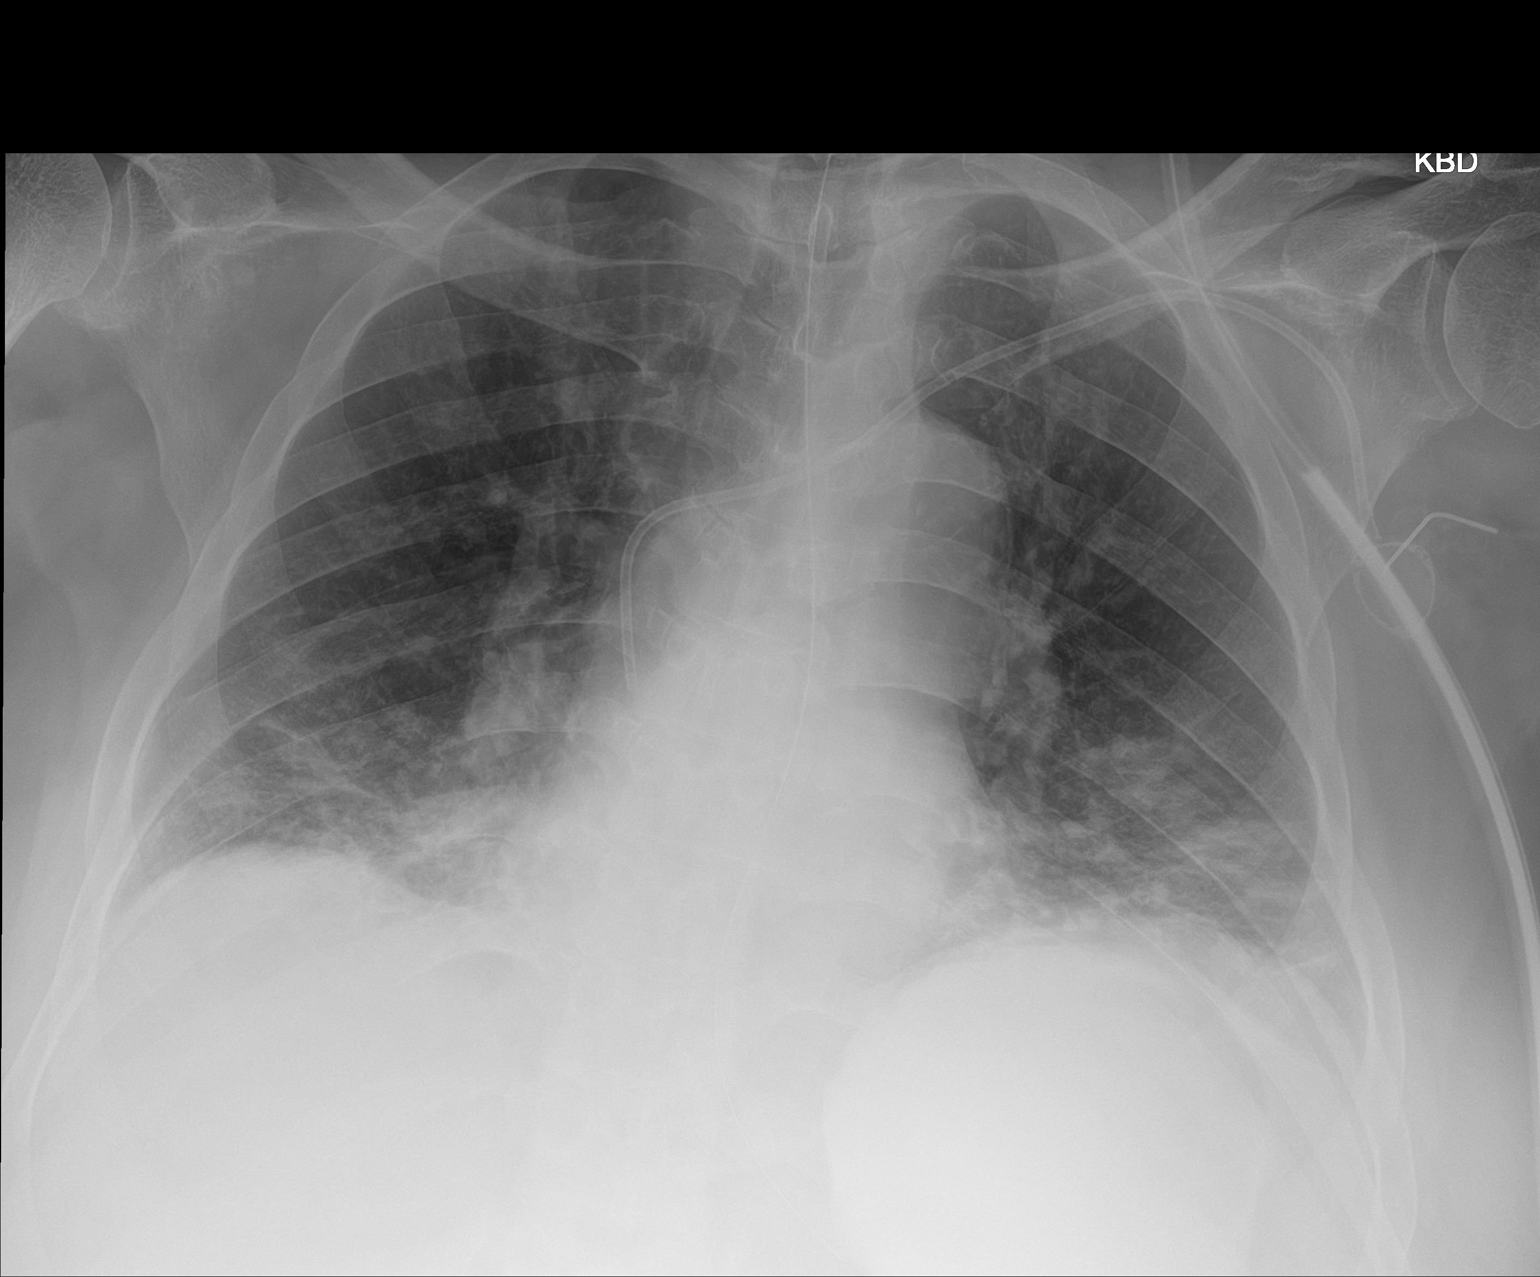

[1 of 1 positions shown; findings below may reference images not displayed]

FINDINGS: Portable AP semi upright view at [X1] hours. Stable left chest
Port-A-Cath. Enteric tube has been placed and courses to the
abdomen, tip not included.

Continued low lung volumes. Stable cardiac size and mediastinal
contours. Visualized tracheal air column is within normal limits. No
pneumothorax. No pulmonary edema. No definite pleural effusion.
Streaky and confluent bibasilar opacity is stable.

Paucity of bowel gas in the upper abdomen. No acute osseous
abnormality identified.
IMPRESSION: 1. Continued low lung volumes with stable bibasilar opacity. Top
differential considerations are atelectasis and infection.
2. No new cardiopulmonary abnormality.

## 2018-09-10 IMAGING — DX PORTABLE ABDOMEN - 1 VIEW
2 series · 2 of 2 positions shown · non-contrast
Comparison: Radiograph [DATE].

CLINICAL DATA: Small bowel obstruction.

EXAM:
PORTABLE ABDOMEN - 1 VIEW

[abdomen kub (1 of 2)]
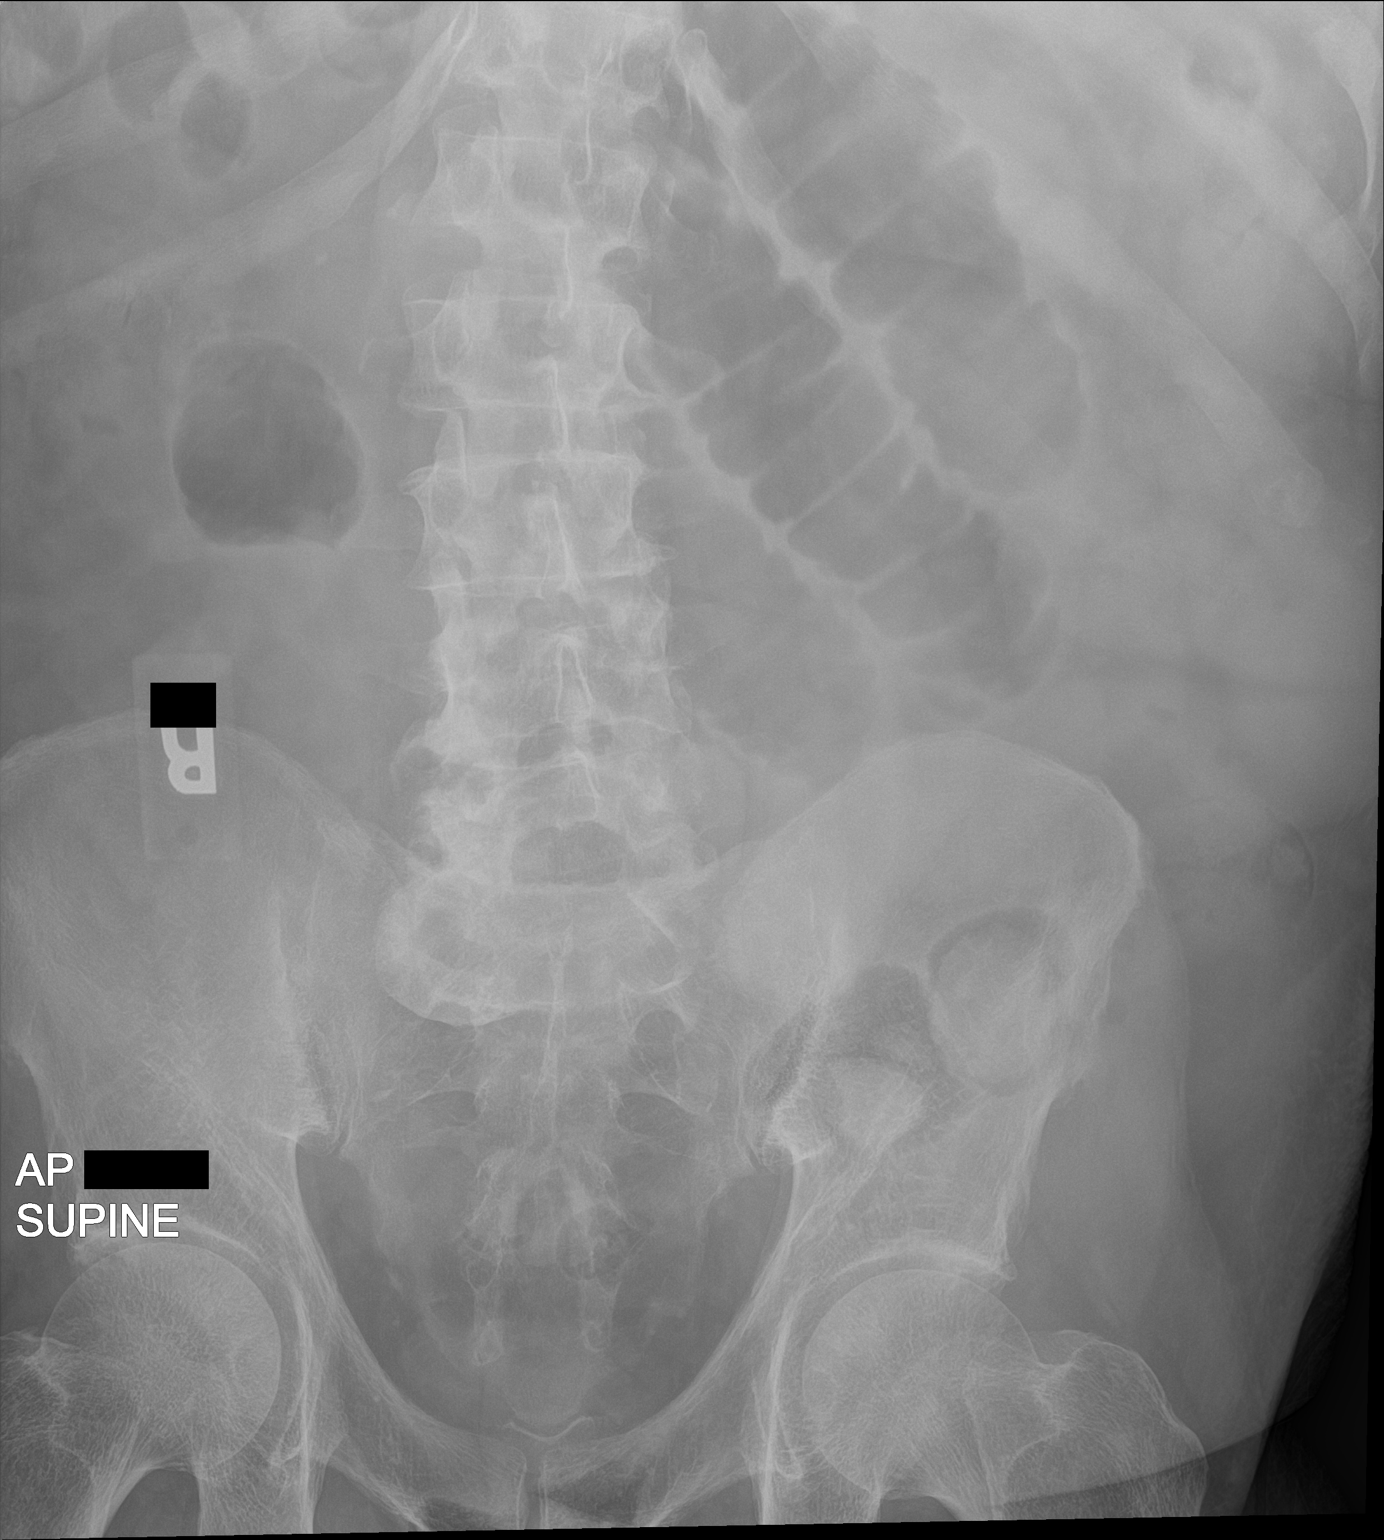

[abdomen kub (2 of 2)]
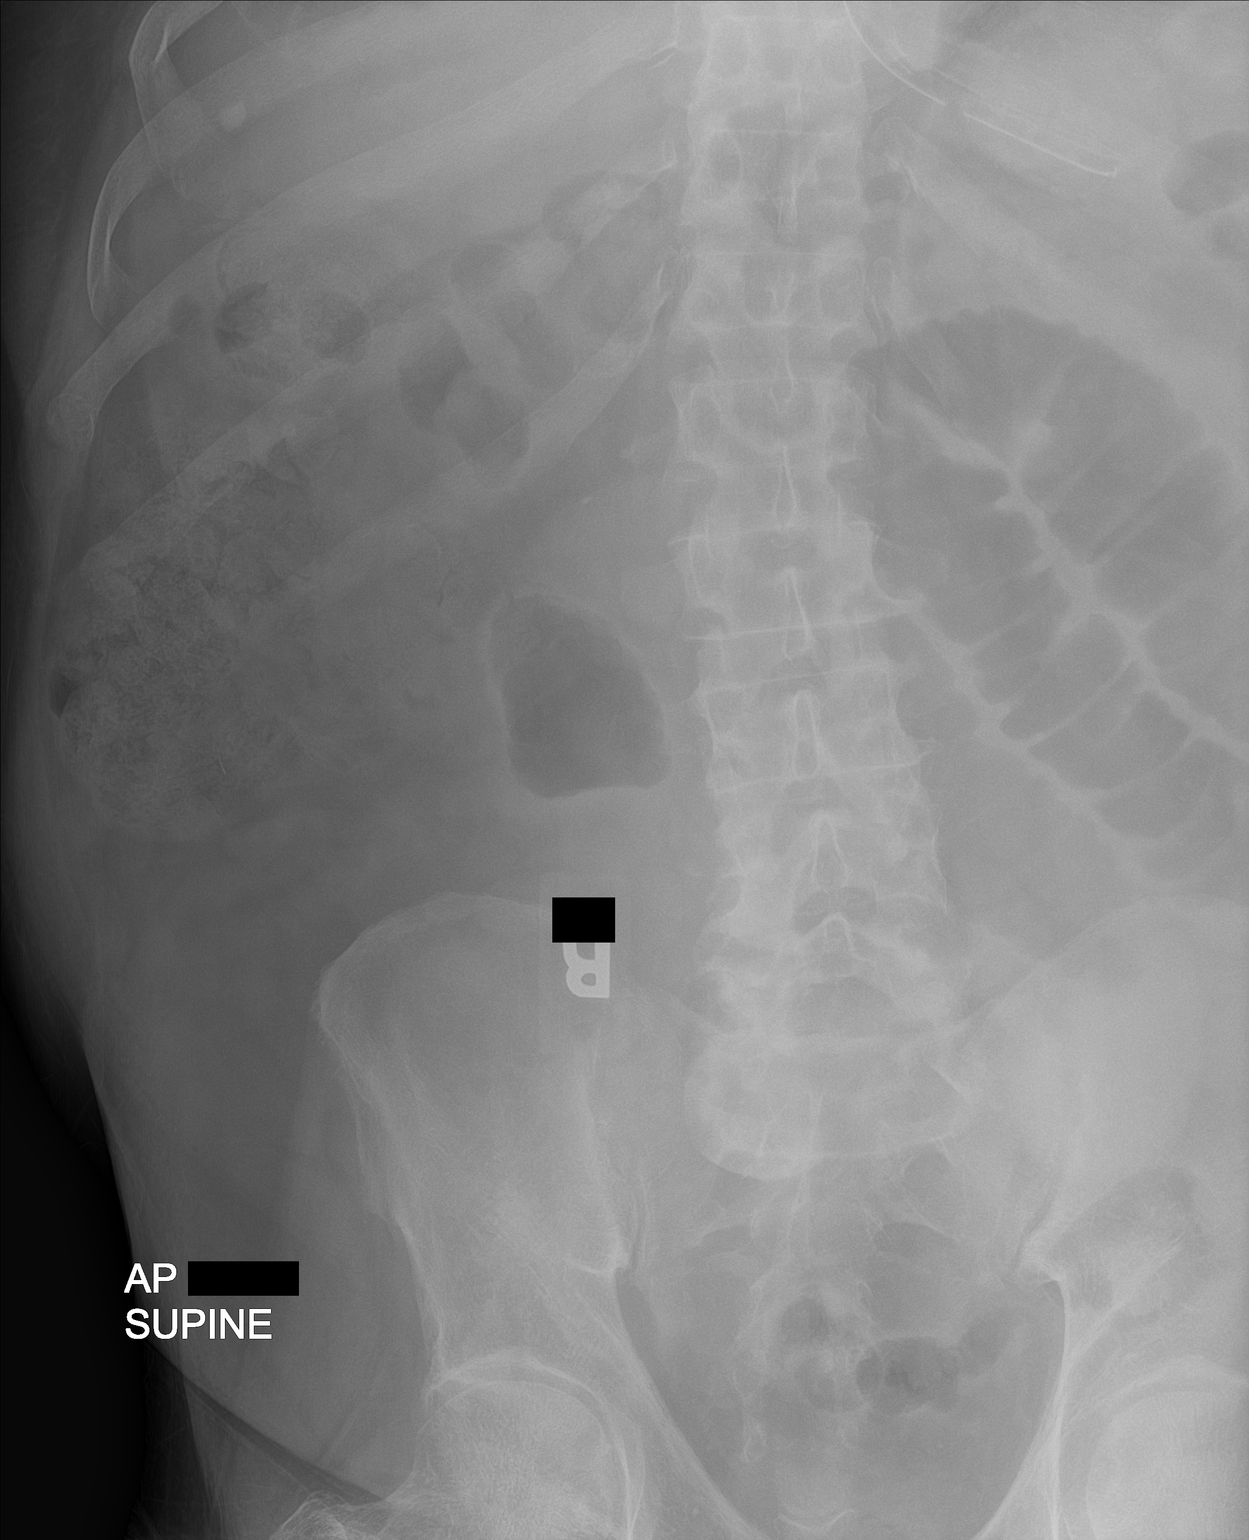

[2 of 2 positions shown; findings below may reference images not displayed]

FINDINGS: Stable small bowel dilatation is noted in left upper quadrant
concerning for ileus or distal small bowel obstruction. No colonic
dilatation is noted. No abnormal calcifications are noted.
Nasogastric tube tip is again noted in proximal stomach.
IMPRESSION: Stable small bowel dilatation is noted in left upper quadrant
concerning for ileus or distal small bowel obstruction. Continued
radiographic follow-up is recommended.

## 2018-09-10 IMAGING — DX PORTABLE ABDOMEN - 1 VIEW
1 series · 1 of 1 positions shown · non-contrast
Comparison: [DATE]

CLINICAL DATA: Small bowel obstruction

EXAM:
PORTABLE ABDOMEN - 1 VIEW

[abdomen kub]
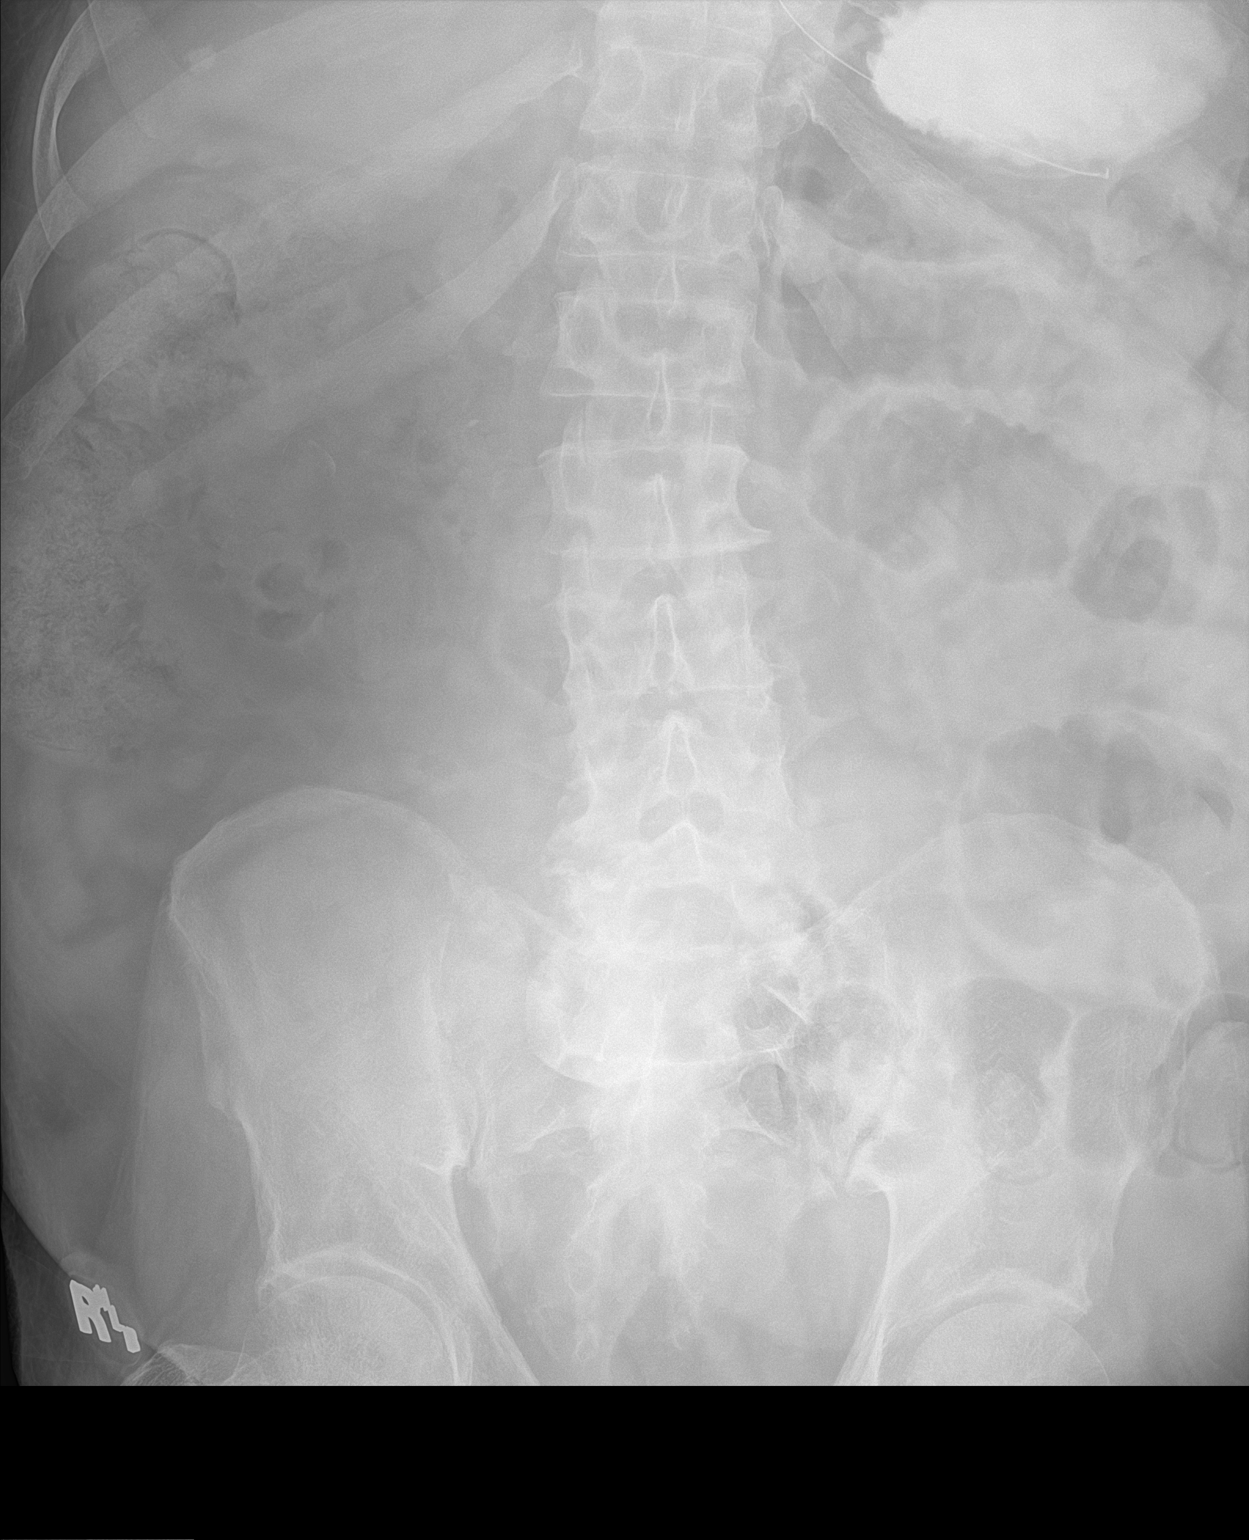

[1 of 1 positions shown; findings below may reference images not displayed]

FINDINGS: Dilated small bowel loops in the left abdomen. Overall degree of
bowel distention decreased since prior study. NG tube tip is seen in
the stomach. Oral contrast material is seen in the fundus of the
stomach. Moderate stool burden in the colon.
IMPRESSION: Small bowel obstruction pattern, slightly improved with NG tube in
the stomach.

## 2018-09-10 MED ORDER — MENTHOL 3 MG MT LOZG: 1.0000 | LOZENGE | OROMUCOSAL | Status: DC | PRN

## 2018-09-10 MED ORDER — PANTOPRAZOLE SODIUM 40 MG IV SOLR
40.0000 mg | INTRAVENOUS | Status: DC
  Administered 2018-09-10 – 2018-09-15 (×6): 40 mg via INTRAVENOUS
  Filled 2018-09-10 (×6): qty 40

## 2018-09-10 MED ORDER — SODIUM CHLORIDE 0.9% FLUSH: 10.0000 mL | INTRAVENOUS | Status: DC | PRN

## 2018-09-10 MED ORDER — DIATRIZOATE MEGLUMINE & SODIUM 66-10 % PO SOLN
90.0000 mL | Freq: Once | ORAL | Status: AC
  Administered 2018-09-10: 90 mL via NASOGASTRIC
  Filled 2018-09-10: qty 90

## 2018-09-10 NOTE — Progress Notes (Signed)
Patient pulled out NG tube and new one reinserted with Agricultural consultant.  ABD XRay order stat for placement.

## 2018-09-10 NOTE — Progress Notes (Signed)
[facility] Progress Note     Subjective: CC: sbo Patient still having some abdominal pain but much improved from yesterday. Denies nausea. Abdomen feels much less bloated. No flatus yet.   Objective: Vital signs in last 24 hours: Temp:  [98.2 F (36.8 C)-98.4 F (36.9 C)] 98.2 F (36.8 C) (08/28 0743) Pulse Rate:  [70-91] 77 (08/28 0743) Resp:  [10-27] 18 (08/28 0743) BP: (130-177)/(64-116) 144/94 (08/28 0743) SpO2:  [95 %-100 %] 95 % (08/28 0743) Last BM Date: 09/05/18  Intake/Output from previous day: 08/27 0701 - 08/28 0700 In: 993.3 [I.V.:993.3] Out: 1350 [Urine:700; Emesis/NG output:650] Intake/Output this shift: No intake/output data recorded.  PE: Gen:  Alert, NAD, pleasant Card:  Regular rate and rhythm Pulm:  Normal effort, nasal cannula present Abd: Soft, non-tender, non-distended, some tinkling BS, NGT with bilious drainage Skin: warm and dry, no rashes  Psych: A&Ox3   Lab Results:  Recent Labs    09/09/18 0805 09/10/18 0737  WBC 13.6* 11.9*  HGB 11.7* 10.2*  HCT 34.2* 30.0*  PLT 252 209   BMET Recent Labs    09/09/18 0805 09/10/18 0737  NA 134* 135  K 4.4 5.1  CL 99 104  CO2 20* 18*  GLUCOSE 119* 118*  BUN 82* 78*  CREATININE 4.35* 3.39*  CALCIUM 10.1 9.6   PT/INR No results for input(s): LABPROT, INR in the last 72 hours. CMP     Component Value Date/Time   NA 135 09/10/2018 0737   NA 141 01/16/2017 0924   K 5.1 09/10/2018 0737   K 4.8 01/16/2017 0924   CL 104 09/10/2018 0737   CO2 18 (L) 09/10/2018 0737   CO2 25 01/16/2017 0924   GLUCOSE 118 (H) 09/10/2018 0737   GLUCOSE 80 01/16/2017 0924   BUN 78 (H) 09/10/2018 0737   BUN 24.0 01/16/2017 0924   CREATININE 3.39 (H) 09/10/2018 0737   CREATININE 2.46 (H) 04/15/2018 1158   CREATININE 1.9 (H) 01/16/2017 0924   CALCIUM 9.6 09/10/2018 0737   CALCIUM 9.8 01/16/2017 0924   PROT 7.4 09/09/2018 0805   PROT 7.1 01/16/2017 0924   ALBUMIN 3.0 (L) 09/09/2018 0805   ALBUMIN 3.2 (L) 01/16/2017 0924   AST 16 09/09/2018 0805   AST 18 04/15/2018 1158   AST 16 01/16/2017 0924   ALT 11 09/09/2018 0805   ALT 13 04/15/2018 1158   ALT 10 01/16/2017 0924   ALKPHOS 79 09/09/2018 0805   ALKPHOS 54 01/16/2017 0924   BILITOT 1.4 (H) 09/09/2018 0805   BILITOT 0.4 04/15/2018 1158   BILITOT 0.54 01/16/2017 0924   GFRNONAA 17 (L) 09/10/2018 0737   GFRNONAA 25 (L) 04/15/2018 1158   GFRAA 20 (L) 09/10/2018 0737   GFRAA 29 (L) 04/15/2018 1158   Lipase     Component Value Date/Time   LIPASE 39 09/09/2018 0805       Studies/Results: Ct Abdomen Pelvis Wo Contrast  Result Date: 09/09/2018 CLINICAL DATA:  Abdominal pain with nausea and vomiting EXAM: CT ABDOMEN AND PELVIS WITHOUT CONTRAST TECHNIQUE: Multidetector CT imaging of the abdomen and pelvis was performed following the standard protocol without IV contrast. Oral contrast was also administered. COMPARISON:  September 15, 2017 and October 27, 2015 FINDINGS: Lower chest: There are multiple areas of airspace consolidation in the lower lobes consistent with multifocal pneumonia. Areas of consolidation are noted in portions of the right middle and right lower lobes. Opacification on the left is more patchy with areas of ground-glass type opacity in the  inferior lingula and anterior segment left lower lobe. There are foci of coronary artery calcification. A small amount of contrast is seen in the distal esophagus. Hepatobiliary: No focal liver lesions are appreciable. Gallbladder wall is not appreciably thickened by CT. There is no evident biliary duct dilatation. Pancreas: There is no pancreatic mass or inflammatory focus. Spleen: No splenic lesions are evident. Adrenals/Urinary Tract: Adrenals bilaterally appear unremarkable. There is a cyst arising from the upper pole of the right kidney measuring 6.7 x 6.6 cm. There is a mass arising from the mid right kidney with a calcified rim measuring 2.0 x 1.8 cm, stable. There is  no demonstrable hydronephrosis on either side. There is no renal or ureteral calculus on either side. Urinary bladder is midline with wall thickness within normal limits. Stomach/Bowel: Stomach is distended with fluid, air, and apparent food material in the dependent portion of the stomach. There is dilatation of most small bowel loops. There is a transition zone in the mid ileal region consistent with small bowel obstruction. Terminal ileum appears normal. No free air or portal venous air. Vascular/Lymphatic: There is no abdominal aortic aneurysm. There are foci of vascular calcification in the aorta and iliac arteries. No adenopathy is evident in the abdomen or pelvis. Reproductive: There are apparent seed implants in the region of the prostate. Prostate and seminal vesicles are normal in size and contour. No pelvic mass is demonstrable. Other: There is fat in each inguinal ring. Question atrophic testis in the left inguinal ring medially. Appendix appears normal. No abscess is evident in the abdomen or pelvis. There is a slight amount of ascites adjacent to the liver. Musculoskeletal: There is degenerative change in the lumbar spine, most notably at L5-S1. There are no blastic or lytic bone lesions. No intramuscular lesions are evident. IMPRESSION: 1. Small bowel obstruction with transition zone in the mid ileal region. No free air or portal venous air is demonstrable. 2.  Multifocal pneumonia. 3. Slight amount of ascites noted adjacent to the liver on the right. 4.  Seed implants in region of prostate. 5. Fat in each inguinal ring. Suspect atrophic testis left inguinal ring. 6.  No abscess in the abdomen or pelvis. 7. Aortic and iliac artery atherosclerosis. Foci of coronary artery calcification noted. Electronically Signed   By: Lowella Grip III M.D.   On: 09/09/2018 13:50   Dg Chest Portable 1 View  Result Date: 09/09/2018 CLINICAL DATA:  Cough and hemoptysis EXAM: PORTABLE CHEST 1 VIEW COMPARISON:   December 04, 2015 FINDINGS: Port-A-Cath tip is in the superior vena cava. No pneumothorax. There is patchy airspace opacity in the lung bases. Lungs elsewhere are clear. Heart size and pulmonary vascularity are normal. No adenopathy. No bone lesions. IMPRESSION: Bibasilar patchy infiltrate, felt to represent multifocal pneumonia. No adenopathy. Heart size normal. Port-A-Cath tip in superior vena cava. Electronically Signed   By: Lowella Grip III M.D.   On: 09/09/2018 08:25   Dg Abd Portable 1 View  Result Date: 09/09/2018 CLINICAL DATA:  Nasogastric placement. EXAM: PORTABLE ABDOMEN - 1 VIEW COMPARISON:  04/17/2018 FINDINGS: Nasogastric tube tip is in the midportion of the stomach. Mild small bowel dilatation, less than on the recent CT. IMPRESSION: Nasogastric tube tip in the midportion of the stomach. Decrease in small bowel dilatation. Electronically Signed   By: Nelson Chimes M.D.   On: 09/09/2018 16:12    Anti-infectives: Anti-infectives (From admission, onward)   Start     Dose/Rate Route Frequency Ordered Stop  09/09/18 1100  cefTRIAXone (ROCEPHIN) 2 g in sodium chloride 0.9 % 100 mL IVPB     2 g 200 mL/hr over 30 Minutes Intravenous Every 24 hours 09/09/18 1051     09/09/18 1100  azithromycin (ZITHROMAX) 500 mg in sodium chloride 0.9 % 250 mL IVPB     500 mg 250 mL/hr over 60 Minutes Intravenous Every 24 hours 09/09/18 1051         Assessment/Plan Hx of gastric cancer - s/p distal gastrectomy in 2018 Dr. Barry Dienes Hx of prostate cancer - s/p radiation AKI on CKD - Cr 3.39, improving, IVF Constipation BPH GERD HTN ?COPD - 2ppd smoking hx  Multifocal pneumonia - seen on CXR and CT, COVID negative, per primary service  SBO vs ileus - CT with sbo with transition in mid ileal region - hx of prior abdominal surgery so could be sbo vs reactive ileus from PNA - NGT with bilious drainage - estimate around 1-2 L out since placement  - start SBO protocol today - ok to clamp NGT  to mobilize  FEN: NPO, IVF; NGT to LIWS VTE: SCDs, SQ heparin  ID: azithromycin/rocephin   LOS: 1 day    Brigid Re , [facility] 09/10/2018, 8:54 AM Pager: [phone] Consults: [phone] 7:00 AM - 4:30 PM M, W-F 7:00 AM - 11:30 AM Tues, Sat, Sun

## 2018-09-10 NOTE — Progress Notes (Signed)
PROGRESS NOTE    Jacob [name]  DDU:[id number] DOB: 29-Dec-1945 DOA: 09/09/2018 PCP: Prince Solian, MD   Brief Narrative:  HPI per Dr. Karmen Bongo on 09/09/2018 Jacob [name] is a 73 y.o. male with medical history significant of stomach cancer in remission; OSA not on CPAP; prostate CA (2013); HTN; HLD; CKD; and BPH presenting with abdominal pain.  He reports that he was constipated and felt n/v.  He has been feeling bloated and nauseated for the last 3-4 days.  He was seen by his PCP yesterday for SOB and hemoptysis and was given an antibiotic for multifocal PNA.  No fever.  No known COVID contacts.  He reports that he does wear a mask when out in public.   ED Course:  Presented with multifocal PNA (COVID negative) and SBO.  2-3 days of cough with sputum, CXR yesterday with PCP with B PNA and given abx.  Today with n/v.  Code sepsis, given small IVF bolus.  Creatinine 2-> 4.35.  CT with SBO.  Surgery called and will place NGT.  **Interim History Patient is feeling a little bit better today no shortness of breath but still complaining of some abdominal tenderness.  Still has very high output from his NG tube so general surgery starting a small bowel protocol.  States his last bowel movement was on Sunday and has not passed any flatus.  Assessment & Plan:   Principal Problem:   Pneumonia Active Problems:   Essential hypertension   Gastric adenocarcinoma ([place])   Acute renal failure superimposed on stage 3 chronic kidney disease (HCC)   SBO (small bowel obstruction) (HCC)  PNA -Patient was seen by his PCP yesterday and diagnosed with multifocal PNA -He was given PO Cefdinir -CXR today indicates multifocal PNA, but it is not clear if this is significantly worse and led to ileus or if his SBO was the driving force for today's visit -COVID negative. -CURB-65 score is 2 - admitted the patient to Med Surg. -Pneumonia Severity Index (PSI) is Class 4, 9%  mortality. -Corticosteroids have been to shown to low overall mortality rate; risk of ARDS; and need for mechanical ventilation.  This is particularly true in severe PNA (class 3+ PSI).  Will add Solumedrol 40 mg daily for now. -Started Azithromycin 500 mg daily AND Rocephin due to no risk factors for MDR cause. -NS @ 75cc/hr and will continue as patient is NPO -Fever control with Antipyretics of Acetaminophen 650 mg po q6hprn -Repeat CBC in am -Sputum cultures pending; urine culture obtained and showed 20,000 colonies of Proteus mirabilis -Blood cultures x2 showed NGTD at 1 Day  -Strep pneumo testing was negative  -PCT was 10.85 -WBC is trending downward from 13.6 is now 11.9 -Repeat CXR in AM  -Will start DuoNeb PRN -C/w IV Soulmedrol 40 mg q24h  SBO -Patient with prior h/o abdominal surgery and gastric cancer presenting with acute onset of abdominal pain with n/v, abdominal distention, and CT findings c/w SBO -Will admit on Med Surg -Gen Surg consulted by ER; currently no indication for surgical intervention -This may be PNA-associated ileus vs. SBO - treatment is the same either way -NPO for bowel rest -NG tube in place with improvement in symptoms -IVF hydration as above -Pain control with morphine 2 mg q2hprn -C/w Antiemetics with IV Zofran 4 mg q6h  -CT scan shows an SBO with transition in the mid ileal region -General surgery evaluated and administered SBO protocol today and are recommending that it is okay to clamp  the NG tube to mobilize  Gastric adenocarcinoma/prostate CA -Gastric CA diagnosed in 10/17, s/p distal gastrectomy in 2018; he remains on active surveillance without evidence of recurrent disease. -s/p seed implants for prostate CA in 2013; continue tamsulosin  Acute renal failure on CKD stage III Hyperphosphatemia Metabolic acidosis -Patient with acute infection (PNA) in addition to n/v with SBO - so very likely prerenal azotemia -He does have mild  ketonuria -He has baseline stage 3-4 CKD but clearly markedly worse now -BUN/creatinine is improving went from 82/4.35 is now 78/3.39 -Will continue to rehydrate as above -Patient CO2 is 18, anion gap was 13 -Patient's phosphorus is 5.2 -Continue to hold his furosemide 80 milligrams every other day -Avoid nephrotoxic agents, contrast dyes, hypotension if possible Repeat CMP in a.m.  HTN -Continue with IV hydralazine 5 mg every 4 PRN for systolic blood pressure greater than 160 and currently holding p.o. hydralazine -Will change Bystolic to IV Labetalol, standing q6h; continue with labetalol 5 mg every 6 scheduled  OSA -Does not wear CPAP  Obesity -Estimated body mass index is 30.81 kg/m as calculated from the following:   Height as of this encounter: 6\' 2"  (1.88 m).   Weight as of this encounter: 108.9 kg. -Weight Loss and Dietary Counseling Given  GERD/peptic ulcer disease/stomach cancer -Placed on empiric PPI IV given that he is on steroids and has a history of peptic ulcer disease  Normocytic Anemia/Anemia of Chronic Kidney Disease -Patient hemoglobin/hematocrit went from 11.7/34.2 and is now 10.2/30.0 and question dilutional drop -Check anemia panel in the a.m. -Continue to monitor for signs and symptoms of bleeding; currently no overt bleeding noted -Repeat CBC in the a.m.  DVT prophylaxis: Heparin 5,000 units sq q8h Code Status: FULL CODE  Family Communication: No family present at bedside  Disposition Plan: Maintain patient for continued management and treatment until patient tolerates p.o. and improved with his pneumonia as well as small bowel obstruction  Consultants:   General Surgery   Procedures: SBO protocol  Antimicrobials:  Anti-infectives (From admission, onward)   Start     Dose/Rate Route Frequency Ordered Stop   09/09/18 1100  cefTRIAXone (ROCEPHIN) 2 g in sodium chloride 0.9 % 100 mL IVPB     2 g 200 mL/hr over 30 Minutes Intravenous Every 24  hours 09/09/18 1051     09/09/18 1100  azithromycin (ZITHROMAX) 500 mg in sodium chloride 0.9 % 250 mL IVPB     500 mg 250 mL/hr over 60 Minutes Intravenous Every 24 hours 09/09/18 1051       Subjective: Seen and examined at bedside states that he is feeling a little bit better today.  No nausea or vomiting.  No chest pain, lightheadedness or dizziness.  Still has some abdominal tenderness and is not passing flatus.  Last bowel movement was on Sunday.  No other concerns or complaints at this time.  Objective: Vitals:   09/09/18 1740 09/09/18 2005 09/09/18 2136 09/10/18 0743  BP: (!) 158/94 130/68 (!) 145/64 (!) 144/94  Pulse: 79 70 75 77  Resp: 16 16 16 18   Temp: 98.4 F (36.9 C) 98.2 F (36.8 C) 98.3 F (36.8 C) 98.2 F (36.8 C)  TempSrc: Oral Oral Oral Oral  SpO2: 97% 96% 98% 95%  Weight:      Height:        Intake/Output Summary (Last 24 hours) at 09/10/2018 1524 Last data filed at 09/10/2018 0800 Gross per 24 hour  Intake 993.27 ml  Output 2150 ml  Net -1156.73 ml   Filed Weights   09/09/18 0737  Weight: 108.9 kg    Examination: Physical Exam:  Constitutional: WN/WD obese African-American male in NAD and appears calm and comfortable Eyes: Lids and conjunctivae normal, sclerae anicteric  ENMT: External Ears, Nose appear normal. Grossly normal hearing.  NG tube is in place Neck: Appears normal, supple, no cervical masses, normal ROM, no appreciable thyromegaly; no JVD Respiratory: Diminished to auscultation bilaterally, no wheezing, rales, rhonchi or crackles. Normal respiratory effort and patient is not tachypenic. No accessory muscle use.  Has unlabored breathing is not wearing supplemental oxygen via nasal cannula Cardiovascular: RRR, no murmurs / rubs / gallops. S1 and S2 auscultated.  No appreciable trace extremity edema.  Abdomen: Soft, slightly tender to palpate, distended slightly.  Bowel sounds positive x4.  GU: Deferred. Musculoskeletal: No clubbing /  cyanosis of digits/nails. No joint deformity upper and lower extremities. Skin: No rashes, lesions, ulcers on a limited skin evaluation. No induration; Warm and dry.  Neurologic: CN 2-12 grossly intact with no focal deficits. Romberg sign and cerebellar reflexes not assessed.  Psychiatric: Normal judgment and insight. Alert and oriented x 3. Pleasant mood and appropriate affect.   Data Reviewed: I have personally reviewed following labs and imaging studies  CBC: Recent Labs  Lab 09/09/18 0805 09/10/18 0737  WBC 13.6* 11.9*  NEUTROABS 10.9* 10.9*  HGB 11.7* 10.2*  HCT 34.2* 30.0*  MCV 95.3 94.0  PLT 252 194   Basic Metabolic Panel: Recent Labs  Lab 09/09/18 0805 09/10/18 0737  NA 134* 135  K 4.4 5.1  CL 99 104  CO2 20* 18*  GLUCOSE 119* 118*  BUN 82* 78*  CREATININE 4.35* 3.39*  CALCIUM 10.1 9.6  MG  --  2.3  PHOS  --  5.2*   GFR: Estimated Creatinine Clearance: 25.9 mL/min (A) (by C-G formula based on SCr of 3.39 mg/dL (H)). Liver Function Tests: Recent Labs  Lab 09/09/18 0805 09/10/18 0737  AST 16 16  ALT 11 11  ALKPHOS 79 62  BILITOT 1.4* 0.7  PROT 7.4 6.6  ALBUMIN 3.0* 2.5*   Recent Labs  Lab 09/09/18 0805  LIPASE 39   No results for input(s): AMMONIA in the last 168 hours. Coagulation Profile: No results for input(s): INR, PROTIME in the last 168 hours. Cardiac Enzymes: No results for input(s): CKTOTAL, CKMB, CKMBINDEX, TROPONINI in the last 168 hours. BNP (last 3 results) No results for input(s): PROBNP in the last 8760 hours. HbA1C: No results for input(s): HGBA1C in the last 72 hours. CBG: No results for input(s): GLUCAP in the last 168 hours. Lipid Profile: No results for input(s): CHOL, HDL, LDLCALC, TRIG, CHOLHDL, LDLDIRECT in the last 72 hours. Thyroid Function Tests: No results for input(s): TSH, T4TOTAL, FREET4, T3FREE, THYROIDAB in the last 72 hours. Anemia Panel: No results for input(s): VITAMINB12, FOLATE, FERRITIN, TIBC, IRON,  RETICCTPCT in the last 72 hours. Sepsis Labs: Recent Labs  Lab 09/09/18 1128 09/09/18 1917  PROCALCITON  --  10.85  LATICACIDVEN 1.1  --     Recent Results (from the past 240 hour(s))  SARS Coronavirus 2 [facility] order, Performed in [facility] hospital lab) Nasopharyngeal Nasopharyngeal Swab     Status: None   Collection Time: 09/09/18  8:10 AM   Specimen: Nasopharyngeal Swab  Result Value Ref Range Status   SARS Coronavirus 2 NEGATIVE NEGATIVE Final    Comment: (NOTE) If result is NEGATIVE SARS-CoV-2 target nucleic acids are NOT DETECTED. The SARS-CoV-2 RNA  is generally detectable in upper and lower  respiratory specimens during the acute phase of infection. The lowest  concentration of SARS-CoV-2 viral copies this assay can detect is 250  copies / mL. A negative result does not preclude SARS-CoV-2 infection  and should not be used as the sole basis for treatment or other  patient management decisions.  A negative result may occur with  improper specimen collection / handling, submission of specimen other  than nasopharyngeal swab, presence of viral mutation(s) within the  areas targeted by this assay, and inadequate number of viral copies  (<250 copies / mL). A negative result must be combined with clinical  observations, patient history, and epidemiological information. If result is POSITIVE SARS-CoV-2 target nucleic acids are DETECTED. The SARS-CoV-2 RNA is generally detectable in upper and lower  respiratory specimens dur ing the acute phase of infection.  Positive  results are indicative of active infection with SARS-CoV-2.  Clinical  correlation with patient history and other diagnostic information is  necessary to determine patient infection status.  Positive results do  not rule out bacterial infection or co-infection with other viruses. If result is PRESUMPTIVE POSTIVE SARS-CoV-2 nucleic acids MAY BE PRESENT.   A presumptive positive result was obtained on the  submitted specimen  and confirmed on repeat testing.  While 2019 novel coronavirus  (SARS-CoV-2) nucleic acids may be present in the submitted sample  additional confirmatory testing may be necessary for epidemiological  and / or clinical management purposes  to differentiate between  SARS-CoV-2 and other Sarbecovirus currently known to infect humans.  If clinically indicated additional testing with an alternate test  methodology [phone]) is advised. The SARS-CoV-2 RNA is generally  detectable in upper and lower respiratory sp ecimens during the acute  phase of infection. The expected result is Negative. Fact Sheet for Patients:  StrictlyIdeas.no Fact Sheet for Healthcare Providers: BankingDealers.co.za This test is not yet approved or cleared by the [place] FDA and has been authorized for detection and/or diagnosis of SARS-CoV-2 by FDA under an Emergency Use Authorization (EUA).  This EUA will remain in effect (meaning this test can be used) for the duration of the COVID-19 declaration under Section 564(b)(1) of the Act, 21 U.S.C. section 360bbb-3(b)(1), unless the authorization is terminated or revoked sooner. Performed at [facility], [place] [address] [place], [place] [address]   Urine culture     Status: Abnormal (Preliminary result)   Collection Time: 09/09/18 10:03 AM   Specimen: Urine, Clean Catch  Result Value Ref Range Status   Specimen Description URINE, CLEAN CATCH  Final   Special Requests   Final    NONE Performed at [facility], [place] [address] [place], [place] [address]    Culture 20,000 COLONIES/mL PROTEUS MIRABILIS (A)  Final   Report Status PENDING  Incomplete  Blood Culture (routine x 2)     Status: None (Preliminary result)   Collection Time: 09/09/18 11:20 AM   Specimen: BLOOD  Result Value Ref Range Status   Specimen Description BLOOD LEFT ANTECUBITAL  Final   Special Requests   Final     BOTTLES DRAWN AEROBIC AND ANAEROBIC Blood Culture adequate volume   Culture   Final    NO GROWTH 1 DAY Performed at [facility], [place] [address] [place], [place] [address]    Report Status PENDING  Incomplete  Blood Culture (routine x 2)     Status: None (Preliminary result)   Collection Time: 09/09/18  11:22 AM   Specimen: BLOOD LEFT HAND  Result Value Ref Range Status   Specimen Description BLOOD LEFT HAND  Final   Special Requests   Final    BOTTLES DRAWN AEROBIC ONLY Blood Culture results may not be optimal due to an inadequate volume of blood received in culture bottles   Culture   Final    NO GROWTH 1 DAY Performed at [facility], [place] [address] [place], [place] [address]    Report Status PENDING  Incomplete    Radiology Studies: Ct Abdomen Pelvis Wo Contrast  Result Date: 09/09/2018 CLINICAL DATA:  Abdominal pain with nausea and vomiting EXAM: CT ABDOMEN AND PELVIS WITHOUT CONTRAST TECHNIQUE: Multidetector CT imaging of the abdomen and pelvis was performed following the standard protocol without IV contrast. Oral contrast was also administered. COMPARISON:  September 15, 2017 and October 27, 2015 FINDINGS: Lower chest: There are multiple areas of airspace consolidation in the lower lobes consistent with multifocal pneumonia. Areas of consolidation are noted in portions of the right middle and right lower lobes. Opacification on the left is more patchy with areas of ground-glass type opacity in the inferior lingula and anterior segment left lower lobe. There are foci of coronary artery calcification. A small amount of contrast is seen in the distal esophagus. Hepatobiliary: No focal liver lesions are appreciable. Gallbladder wall is not appreciably thickened by CT. There is no evident biliary duct dilatation. Pancreas: There is no pancreatic mass or inflammatory focus. Spleen: No splenic lesions are evident. Adrenals/Urinary Tract: Adrenals bilaterally appear unremarkable.  There is a cyst arising from the upper pole of the right kidney measuring 6.7 x 6.6 cm. There is a mass arising from the mid right kidney with a calcified rim measuring 2.0 x 1.8 cm, stable. There is no demonstrable hydronephrosis on either side. There is no renal or ureteral calculus on either side. Urinary bladder is midline with wall thickness within normal limits. Stomach/Bowel: Stomach is distended with fluid, air, and apparent food material in the dependent portion of the stomach. There is dilatation of most small bowel loops. There is a transition zone in the mid ileal region consistent with small bowel obstruction. Terminal ileum appears normal. No free air or portal venous air. Vascular/Lymphatic: There is no abdominal aortic aneurysm. There are foci of vascular calcification in the aorta and iliac arteries. No adenopathy is evident in the abdomen or pelvis. Reproductive: There are apparent seed implants in the region of the prostate. Prostate and seminal vesicles are normal in size and contour. No pelvic mass is demonstrable. Other: There is fat in each inguinal ring. Question atrophic testis in the left inguinal ring medially. Appendix appears normal. No abscess is evident in the abdomen or pelvis. There is a slight amount of ascites adjacent to the liver. Musculoskeletal: There is degenerative change in the lumbar spine, most notably at L5-S1. There are no blastic or lytic bone lesions. No intramuscular lesions are evident. IMPRESSION: 1. Small bowel obstruction with transition zone in the mid ileal region. No free air or portal venous air is demonstrable. 2.  Multifocal pneumonia. 3. Slight amount of ascites noted adjacent to the liver on the right. 4.  Seed implants in region of prostate. 5. Fat in each inguinal ring. Suspect atrophic testis left inguinal ring. 6.  No abscess in the abdomen or pelvis. 7. Aortic and iliac artery atherosclerosis. Foci of coronary artery calcification noted.  Electronically Signed   By: Lowella Grip III M.D.  On: 09/09/2018 13:50   Dg Chest Portable 1 View  Result Date: 09/09/2018 CLINICAL DATA:  Cough and hemoptysis EXAM: PORTABLE CHEST 1 VIEW COMPARISON:  December 04, 2015 FINDINGS: Port-A-Cath tip is in the superior vena cava. No pneumothorax. There is patchy airspace opacity in the lung bases. Lungs elsewhere are clear. Heart size and pulmonary vascularity are normal. No adenopathy. No bone lesions. IMPRESSION: Bibasilar patchy infiltrate, felt to represent multifocal pneumonia. No adenopathy. Heart size normal. Port-A-Cath tip in superior vena cava. Electronically Signed   By: Lowella Grip III M.D.   On: 09/09/2018 08:25   Dg Abd Portable 1v  Result Date: 09/10/2018 CLINICAL DATA:  Small bowel obstruction. EXAM: PORTABLE ABDOMEN - 1 VIEW COMPARISON:  Radiograph of September 09, 2018. FINDINGS: Stable small bowel dilatation is noted in left upper quadrant concerning for ileus or distal small bowel obstruction. No colonic dilatation is noted. No abnormal calcifications are noted. Nasogastric tube tip is again noted in proximal stomach. IMPRESSION: Stable small bowel dilatation is noted in left upper quadrant concerning for ileus or distal small bowel obstruction. Continued radiographic follow-up is recommended. Electronically Signed   By: Marijo Conception M.D.   On: 09/10/2018 08:57   Dg Abd Portable 1 View  Result Date: 09/09/2018 CLINICAL DATA:  Nasogastric placement. EXAM: PORTABLE ABDOMEN - 1 VIEW COMPARISON:  04/17/2018 FINDINGS: Nasogastric tube tip is in the midportion of the stomach. Mild small bowel dilatation, less than on the recent CT. IMPRESSION: Nasogastric tube tip in the midportion of the stomach. Decrease in small bowel dilatation. Electronically Signed   By: Nelson Chimes M.D.   On: 09/09/2018 16:12   Scheduled Meds: . heparin  5,000 Units Subcutaneous Q8H  . labetalol  5 mg Intravenous Q6H  . methylPREDNISolone (SOLU-MEDROL)  injection  40 mg Intravenous Q24H  . pantoprazole (PROTONIX) IV  40 mg Intravenous Q24H   Continuous Infusions: . sodium chloride 75 mL/hr at 09/09/18 1621  . azithromycin 500 mg (09/10/18 1232)  . cefTRIAXone (ROCEPHIN)  IV 2 g (09/10/18 1227)    LOS: 1 day   Kerney Elbe, DO [facility] PAGER is on [place]  If 7PM-7AM, please contact night-coverage [email or web address] Password [email or web address] 09/10/2018, 3:24 PM

## 2018-09-10 NOTE — Plan of Care (Signed)
Discussed with patient and family plan of care for the evening, pain management and restarting the NG tube to LIS by previous shift RN with some teach back displayed.

## 2018-09-11 ENCOUNTER — Inpatient Hospital Stay (HOSPITAL_COMMUNITY): Payer: Medicare Other

## 2018-09-11 LAB — CBC WITH DIFFERENTIAL/PLATELET
Abs Immature Granulocytes: 0.59 10*3/uL — ABNORMAL HIGH (ref 0.00–0.07)
Basophils Absolute: 0 10*3/uL (ref 0.0–0.1)
Basophils Relative: 0 %
Eosinophils Absolute: 0 10*3/uL (ref 0.0–0.5)
Eosinophils Relative: 0 %
HCT: 28.7 % — ABNORMAL LOW (ref 39.0–52.0)
Hemoglobin: 9.8 g/dL — ABNORMAL LOW (ref 13.0–17.0)
Immature Granulocytes: 5 %
Lymphocytes Relative: 6 %
Lymphs Abs: 0.7 10*3/uL (ref 0.7–4.0)
MCH: 32 pg (ref 26.0–34.0)
MCHC: 34.1 g/dL (ref 30.0–36.0)
MCV: 93.8 fL (ref 80.0–100.0)
Monocytes Absolute: 1.1 10*3/uL — ABNORMAL HIGH (ref 0.1–1.0)
Monocytes Relative: 9 %
Neutro Abs: 10.3 10*3/uL — ABNORMAL HIGH (ref 1.7–7.7)
Neutrophils Relative %: 80 %
Platelets: 261 10*3/uL (ref 150–400)
RBC: 3.06 MIL/uL — ABNORMAL LOW (ref 4.22–5.81)
RDW: 15 % (ref 11.5–15.5)
WBC: 12.7 10*3/uL — ABNORMAL HIGH (ref 4.0–10.5)
nRBC: 0 % (ref 0.0–0.2)

## 2018-09-11 LAB — PHOSPHORUS: Phosphorus: 4.2 mg/dL (ref 2.5–4.6)

## 2018-09-11 LAB — COMPREHENSIVE METABOLIC PANEL
ALT: 11 U/L (ref 0–44)
AST: 13 U/L — ABNORMAL LOW (ref 15–41)
Albumin: 2.5 g/dL — ABNORMAL LOW (ref 3.5–5.0)
Alkaline Phosphatase: 57 U/L (ref 38–126)
Anion gap: 10 (ref 5–15)
BUN: 79 mg/dL — ABNORMAL HIGH (ref 8–23)
CO2: 21 mmol/L — ABNORMAL LOW (ref 22–32)
Calcium: 9.8 mg/dL (ref 8.9–10.3)
Chloride: 108 mmol/L (ref 98–111)
Creatinine, Ser: 3.19 mg/dL — ABNORMAL HIGH (ref 0.61–1.24)
GFR calc Af Amer: 21 mL/min — ABNORMAL LOW (ref >60)
GFR calc non Af Amer: 18 mL/min — ABNORMAL LOW (ref >60)
Glucose, Bld: 106 mg/dL — ABNORMAL HIGH (ref 70–99)
Potassium: 5 mmol/L (ref 3.5–5.1)
Sodium: 139 mmol/L (ref 135–145)
Total Bilirubin: 0.7 mg/dL (ref 0.3–1.2)
Total Protein: 6.4 g/dL — ABNORMAL LOW (ref 6.5–8.1)

## 2018-09-11 LAB — URINE CULTURE: Culture: 20000 — AB

## 2018-09-11 LAB — MAGNESIUM: Magnesium: 2.6 mg/dL — ABNORMAL HIGH (ref 1.7–2.4)

## 2018-09-11 IMAGING — DX PORTABLE ABDOMEN - 1 VIEW
1 series · 1 of 1 positions shown · non-contrast
Comparison: [DATE]

CLINICAL DATA: Follow-up small bowel obstruction

EXAM:
PORTABLE ABDOMEN - 1 VIEW

[abdomen kub]
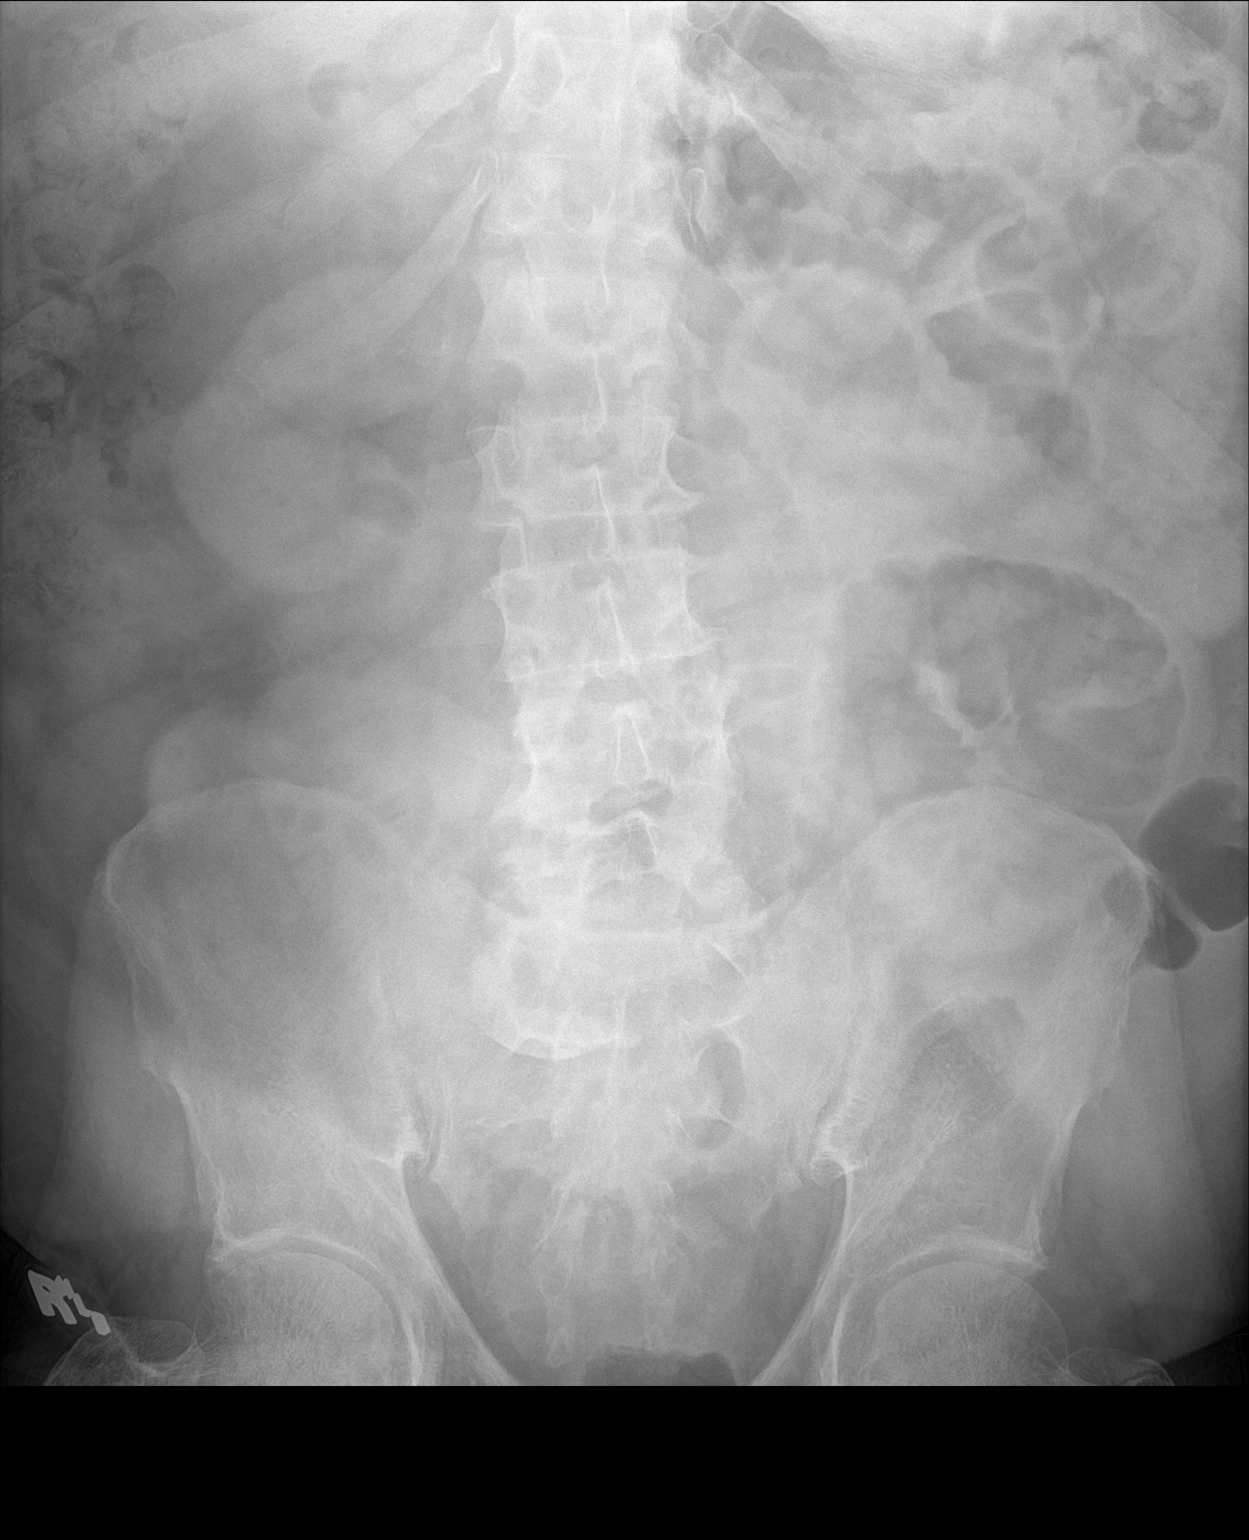

[1 of 1 positions shown; findings below may reference images not displayed]

FINDINGS: Single image shows some persistent fluid and air dilatation of the
small intestine consistent with persistent small-bowel obstruction.
No significant change suspected based on this single image.
IMPRESSION: Persistent small bowel obstruction pattern.

## 2018-09-11 MED ORDER — IPRATROPIUM-ALBUTEROL 0.5-2.5 (3) MG/3ML IN SOLN: 3.0000 mL | Freq: Four times a day (QID) | RESPIRATORY_TRACT | Status: DC | PRN

## 2018-09-11 MED ORDER — BISACODYL 10 MG RE SUPP
10.0000 mg | Freq: Once | RECTAL | Status: AC
  Administered 2018-09-11: 10 mg via RECTAL
  Filled 2018-09-11: qty 1

## 2018-09-11 NOTE — Progress Notes (Signed)
[facility] Progress Note     Subjective: CC: sbo vs ileus Patient removed NGT accidentally overnight, NGT was reinserted and then fell out again. Patient currently denies abdominal pain or nausea. No flatus or BM. Has not been ambulating and reports staff will not allow him.   Objective: Vital signs in last 24 hours: Temp:  [97.8 F (36.6 C)-98.4 F (36.9 C)] 97.8 F (36.6 C) (08/29 0728) Pulse Rate:  [77-92] 80 (08/29 0728) Resp:  [16-18] 16 (08/29 0120) BP: (148-182)/(91-98) 166/97 (08/29 0728) SpO2:  [92 %-97 %] 92 % (08/29 0728) Last BM Date: 09/05/18  Intake/Output from previous day: 08/28 0701 - 08/29 0700 In: 1600.5 [I.V.:1570.5; NG/GT:30] Out: 2725 [Urine:1425; Emesis/NG output:1300] Intake/Output this shift: No intake/output data recorded.  PE: Gen:  Alert, NAD, pleasant Card:  Regular rate and rhythm Pulm:  Normal effort, nasal cannula present Abd: Soft, non-tender, moderately distended, some tinkling BS Skin: warm and dry, no rashes  Psych: A&Ox3   Lab Results:  Recent Labs    09/10/18 0737 09/11/18 0530  WBC 11.9* 12.7*  HGB 10.2* 9.8*  HCT 30.0* 28.7*  PLT 209 261   BMET Recent Labs    09/10/18 0737 09/11/18 0530  NA 135 139  K 5.1 5.0  CL 104 108  CO2 18* 21*  GLUCOSE 118* 106*  BUN 78* 79*  CREATININE 3.39* 3.19*  CALCIUM 9.6 9.8   PT/INR No results for input(s): LABPROT, INR in the last 72 hours. CMP     Component Value Date/Time   NA 139 09/11/2018 0530   NA 141 01/16/2017 0924   K 5.0 09/11/2018 0530   K 4.8 01/16/2017 0924   CL 108 09/11/2018 0530   CO2 21 (L) 09/11/2018 0530   CO2 25 01/16/2017 0924   GLUCOSE 106 (H) 09/11/2018 0530   GLUCOSE 80 01/16/2017 0924   BUN 79 (H) 09/11/2018 0530   BUN 24.0 01/16/2017 0924   CREATININE 3.19 (H) 09/11/2018 0530   CREATININE 2.46 (H) 04/15/2018 1158   CREATININE 1.9 (H) 01/16/2017 0924   CALCIUM 9.8 09/11/2018 0530   CALCIUM 9.8 01/16/2017 0924   PROT 6.4 (L)  09/11/2018 0530   PROT 7.1 01/16/2017 0924   ALBUMIN 2.5 (L) 09/11/2018 0530   ALBUMIN 3.2 (L) 01/16/2017 0924   AST 13 (L) 09/11/2018 0530   AST 18 04/15/2018 1158   AST 16 01/16/2017 0924   ALT 11 09/11/2018 0530   ALT 13 04/15/2018 1158   ALT 10 01/16/2017 0924   ALKPHOS 57 09/11/2018 0530   ALKPHOS 54 01/16/2017 0924   BILITOT 0.7 09/11/2018 0530   BILITOT 0.4 04/15/2018 1158   BILITOT 0.54 01/16/2017 0924   GFRNONAA 18 (L) 09/11/2018 0530   GFRNONAA 25 (L) 04/15/2018 1158   GFRAA 21 (L) 09/11/2018 0530   GFRAA 29 (L) 04/15/2018 1158   Lipase     Component Value Date/Time   LIPASE 39 09/09/2018 0805       Studies/Results: Ct Abdomen Pelvis Wo Contrast  Result Date: 09/09/2018 CLINICAL DATA:  Abdominal pain with nausea and vomiting EXAM: CT ABDOMEN AND PELVIS WITHOUT CONTRAST TECHNIQUE: Multidetector CT imaging of the abdomen and pelvis was performed following the standard protocol without IV contrast. Oral contrast was also administered. COMPARISON:  September 15, 2017 and October 27, 2015 FINDINGS: Lower chest: There are multiple areas of airspace consolidation in the lower lobes consistent with multifocal pneumonia. Areas of consolidation are noted in portions of the right middle and right lower  lobes. Opacification on the left is more patchy with areas of ground-glass type opacity in the inferior lingula and anterior segment left lower lobe. There are foci of coronary artery calcification. A small amount of contrast is seen in the distal esophagus. Hepatobiliary: No focal liver lesions are appreciable. Gallbladder wall is not appreciably thickened by CT. There is no evident biliary duct dilatation. Pancreas: There is no pancreatic mass or inflammatory focus. Spleen: No splenic lesions are evident. Adrenals/Urinary Tract: Adrenals bilaterally appear unremarkable. There is a cyst arising from the upper pole of the right kidney measuring 6.7 x 6.6 cm. There is a mass arising from  the mid right kidney with a calcified rim measuring 2.0 x 1.8 cm, stable. There is no demonstrable hydronephrosis on either side. There is no renal or ureteral calculus on either side. Urinary bladder is midline with wall thickness within normal limits. Stomach/Bowel: Stomach is distended with fluid, air, and apparent food material in the dependent portion of the stomach. There is dilatation of most small bowel loops. There is a transition zone in the mid ileal region consistent with small bowel obstruction. Terminal ileum appears normal. No free air or portal venous air. Vascular/Lymphatic: There is no abdominal aortic aneurysm. There are foci of vascular calcification in the aorta and iliac arteries. No adenopathy is evident in the abdomen or pelvis. Reproductive: There are apparent seed implants in the region of the prostate. Prostate and seminal vesicles are normal in size and contour. No pelvic mass is demonstrable. Other: There is fat in each inguinal ring. Question atrophic testis in the left inguinal ring medially. Appendix appears normal. No abscess is evident in the abdomen or pelvis. There is a slight amount of ascites adjacent to the liver. Musculoskeletal: There is degenerative change in the lumbar spine, most notably at L5-S1. There are no blastic or lytic bone lesions. No intramuscular lesions are evident. IMPRESSION: 1. Small bowel obstruction with transition zone in the mid ileal region. No free air or portal venous air is demonstrable. 2.  Multifocal pneumonia. 3. Slight amount of ascites noted adjacent to the liver on the right. 4.  Seed implants in region of prostate. 5. Fat in each inguinal ring. Suspect atrophic testis left inguinal ring. 6.  No abscess in the abdomen or pelvis. 7. Aortic and iliac artery atherosclerosis. Foci of coronary artery calcification noted. Electronically Signed   By: Lowella Grip III M.D.   On: 09/09/2018 13:50   Dg Chest Port 1 View  Result Date:  09/10/2018 CLINICAL DATA:  73 year old male with shortness of breath and abdominal pain. EXAM: PORTABLE CHEST 1 VIEW COMPARISON:  09/09/2018 and earlier. FINDINGS: Portable AP semi upright view at 1610 hours. Stable left chest Port-A-Cath. Enteric tube has been placed and courses to the abdomen, tip not included. Continued low lung volumes. Stable cardiac size and mediastinal contours. Visualized tracheal air column is within normal limits. No pneumothorax. No pulmonary edema. No definite pleural effusion. Streaky and confluent bibasilar opacity is stable. Paucity of bowel gas in the upper abdomen. No acute osseous abnormality identified. IMPRESSION: 1. Continued low lung volumes with stable bibasilar opacity. Top differential considerations are atelectasis and infection. 2. No new cardiopulmonary abnormality. Electronically Signed   By: Genevie Ann M.D.   On: 09/10/2018 16:27   Dg Abd Portable 1v  Result Date: 09/11/2018 CLINICAL DATA:  Follow-up small bowel obstruction EXAM: PORTABLE ABDOMEN - 1 VIEW COMPARISON:  09/10/2018 FINDINGS: Single image shows some persistent fluid and air dilatation of  the small intestine consistent with persistent small-bowel obstruction. No significant change suspected based on this single image. IMPRESSION: Persistent small bowel obstruction pattern. Electronically Signed   By: Nelson Chimes M.D.   On: 09/11/2018 08:20   Dg Abd Portable 1v  Result Date: 09/11/2018 CLINICAL DATA:  Nasogastric tube placement EXAM: PORTABLE ABDOMEN - 1 VIEW COMPARISON:  09/10/2018 FINDINGS: The bowel gas pattern is normal. No radio-opaque calculi or other significant radiographic abnormality are seen. IMPRESSION: No nasogastric tube is visualized. It may be coiled in the esophagus. Electronically Signed   By: Ulyses Jarred M.D.   On: 09/11/2018 00:11   Dg Abd Portable 1v  Result Date: 09/10/2018 CLINICAL DATA:  NG tube placement EXAM: PORTABLE ABDOMEN - 1 VIEW COMPARISON:  09/10/2018 FINDINGS: NG  tube is not visualized in the mid to lower chest or abdomen. Bibasilar atelectasis. Nonobstructive bowel-gas crash that mildly prominent left abdominal small bowel loops again noted. IMPRESSION: No NG tube visualized. Electronically Signed   By: Rolm Baptise M.D.   On: 09/10/2018 23:47   Dg Abd Portable 1v-small Bowel Obstruction Protocol-initial, 8 Hr Delay  Result Date: 09/10/2018 CLINICAL DATA:  Small bowel obstruction EXAM: PORTABLE ABDOMEN - 1 VIEW COMPARISON:  09/10/2018 FINDINGS: Dilated small bowel loops in the left abdomen. Overall degree of bowel distention decreased since prior study. NG tube tip is seen in the stomach. Oral contrast material is seen in the fundus of the stomach. Moderate stool burden in the colon. IMPRESSION: Small bowel obstruction pattern, slightly improved with NG tube in the stomach. Electronically Signed   By: Rolm Baptise M.D.   On: 09/10/2018 18:48   Dg Abd Portable 1v  Result Date: 09/10/2018 CLINICAL DATA:  Small bowel obstruction. EXAM: PORTABLE ABDOMEN - 1 VIEW COMPARISON:  Radiograph of September 09, 2018. FINDINGS: Stable small bowel dilatation is noted in left upper quadrant concerning for ileus or distal small bowel obstruction. No colonic dilatation is noted. No abnormal calcifications are noted. Nasogastric tube tip is again noted in proximal stomach. IMPRESSION: Stable small bowel dilatation is noted in left upper quadrant concerning for ileus or distal small bowel obstruction. Continued radiographic follow-up is recommended. Electronically Signed   By: Marijo Conception M.D.   On: 09/10/2018 08:57   Dg Abd Portable 1 View  Result Date: 09/09/2018 CLINICAL DATA:  Nasogastric placement. EXAM: PORTABLE ABDOMEN - 1 VIEW COMPARISON:  04/17/2018 FINDINGS: Nasogastric tube tip is in the midportion of the stomach. Mild small bowel dilatation, less than on the recent CT. IMPRESSION: Nasogastric tube tip in the midportion of the stomach. Decrease in small bowel  dilatation. Electronically Signed   By: Nelson Chimes M.D.   On: 09/09/2018 16:12    Anti-infectives: Anti-infectives (From admission, onward)   Start     Dose/Rate Route Frequency Ordered Stop   09/09/18 1100  cefTRIAXone (ROCEPHIN) 2 g in sodium chloride 0.9 % 100 mL IVPB     2 g 200 mL/hr over 30 Minutes Intravenous Every 24 hours 09/09/18 1051     09/09/18 1100  azithromycin (ZITHROMAX) 500 mg in sodium chloride 0.9 % 250 mL IVPB     500 mg 250 mL/hr over 60 Minutes Intravenous Every 24 hours 09/09/18 1051         Assessment/Plan Hx of gastric cancer - s/p distal gastrectomy in 2018 Dr. Barry Dienes Hx of prostate cancer - s/p radiation AKI on CKD - Cr 3.19, improving, IVF Constipation BPH GERD HTN ?COPD - 2ppd smoking hx  Multifocal  pneumonia- seen on CXR and CT, COVID negative, per primary service  SBO vs ileus - CT with sbo with transition in mid ileal region - hx of prior abdominal surgery so could be sbo vs reactive ileus from PNA - NGT removed x2 overnight - patient not currently nauseated but still somewhat distended - can leave NGT out for now but if patient becomes nauseated or vomits would replace NGT - film yesterday evening improved, but film this AM with sbo - try suppository today - MOBILIZE!!!!  FEN: NPO, IVF;  VTE: SCDs, SQ heparin  ID: azithromycin/rocephin  LOS: 2 days    Brigid Re , [facility] 09/11/2018, 9:41 AM Pager: [phone] Consults: [phone] 7:00 AM - 4:30 PM M, W-F 7:00 AM - 11:30 AM Tues, Sat, Sun

## 2018-09-11 NOTE — Progress Notes (Signed)
Asked the patient again if he would let us place his NG tube he refused.  Explained that he needs to inform me as his RN or the staff he he has any signs and symptoms of discomfort.  And the importance of the NG Tube.  I let him know the MD tonight would be informed.  And from MD previous notes the surgery may have placed with Charge RN confirming the possibility.  ED Course:  Presented with multifocal PNA (COVID negative) and SBO.  2-3 days of cough with sputum, CXR yesterday with PCP with B PNA and given abx.  Today with n/v.  Code sepsis, given small IVF bolus.  Creatinine 2-> 4.35.  CT with SBO.  Surgery called and will place NGT.  Admitting PA placed NG Tube in ED.

## 2018-09-11 NOTE — Progress Notes (Signed)
Patient pulled out NG Tube while touching his nose, mouth and the tube.  Patient had new NG Tube reinserted with the Charge RN in the room and placed slight further with another RN.  Patient was violently coughing once someone entered the room and stopped after no staff was in the room. Patient coughed so violently that the NG came up in the back of his throat.   Explained before reinserting that his small bowel obstruction with recent xray had improved with the NG tube but he was still putting out more bile.  And had slight emesis with bile and saliva with the coughing.  Patient seemed to understand why it needed to be placed back in with some reinforcement needed.  Patient originally thought he could be sedated and "Knocked out, like my wife was for her colon test."  Patient has been given morphine 2 mg via IV to see if it helps prior to reinserting a NG tube a 2nd time.

## 2018-09-11 NOTE — Progress Notes (Signed)
Patient asked for room light to be turned off because he was going to sleep. RN informed patient that his NG tube needed to be reinserted. Patient stated " ya'll aint putting that shit back in". RN educated patient regarding purpose of NG tube. No evidence of learning. Continued to refuse placement. Agitation increasing. Patient notified to call for assistance if needed. Staff will continue to monitor

## 2018-09-11 NOTE — Progress Notes (Signed)
PROGRESS NOTE    Jacob [name]  QMV:[id number] DOB: [birth year] DOA: 09/09/2018 PCP: Prince Solian, MD   Brief Narrative:  HPI per Dr. Karmen Bongo on 09/09/2018 Jacob [name] is a 73 y.o. male with medical history significant of stomach cancer in remission; OSA not on CPAP; prostate CA (2013); HTN; HLD; CKD; and BPH presenting with abdominal pain.  He reports that he was constipated and felt n/v.  He has been feeling bloated and nauseated for the last 3-4 days.  He was seen by his PCP yesterday for SOB and hemoptysis and was given an antibiotic for multifocal PNA.  No fever.  No known COVID contacts.  He reports that he does wear a mask when out in public.   ED Course:  Presented with multifocal PNA (COVID negative) and SBO.  2-3 days of cough with sputum, CXR yesterday with PCP with B PNA and given abx.  Today with n/v.  Code sepsis, given small IVF bolus.  Creatinine 2-> 4.35.  CT with SBO.  Surgery called and will place NGT.  **Interim History Patient is feeling a little bit better today no shortness of breath but still complaining of some abdominal tenderness.  Underwent a small bowel protocol via his NG tube by general surgery yesterday but overnight pulled it out and refused to have his NG tube replaced.  General surgery evaluated and recommended continue to leave out his NG tube and recommending ambulation for mobilization as well as trying suppository today.  Yesterday's evening film was improved but today had a film that showed SBO.  Still waiting for bowel function to return.  General surgery recommending leaving out NG tube for now but if becomes nauseated or vomits they will need to replace NG tube.  Assessment & Plan:   Principal Problem:   Pneumonia Active Problems:   Essential hypertension   Gastric adenocarcinoma ([place])   Acute renal failure superimposed on stage 3 chronic kidney disease (HCC)   SBO (small bowel obstruction) (HCC)   PNA -Patient was seen  by his PCP and diagnosed with multifocal PNA -He was given PO Cefdinir -CXR indicates multifocal PNA, but it is not clear if this is significantly worse and led to ileus or if his SBO was the driving force for today's visit -COVID negative. -CURB-65 score is 2 - admitted the patient to Med Surg. -Pneumonia Severity Index (PSI) is Class 4, 9% mortality. -Corticosteroids have been to shown to low overall mortality rate; risk of ARDS; and need for mechanical ventilation.  This is particularly true in severe PNA (class 3+ PSI).  Will add Solumedrol 40 mg daily for now. -Started Azithromycin 500 mg daily AND Rocephin due to no risk factors for MDR cause. -NS @ 75cc/hr and will continue as patient is NPO -Fever control with Antipyretics of Acetaminophen 650 mg po q6hprn -Repeat CBC in am -Sputum cultures pending; urine culture obtained and showed 20,000 colonies of Proteus mirabilis -Blood cultures x2 showed NGTD at 2 Day  -Strep pneumo testing was negative  -PCT was 10.85 -WBC was trending downward from 13.6 -> 11.9 but is now 12.7 -Repeat CXR in AM  -Will start DuoNeb PRN -C/w IV Soulmedrol 40 mg q24h for now  SBO -Patient with prior h/o abdominal surgery and gastric cancer presenting with acute onset of abdominal pain with n/v, abdominal distention, and CT findings c/w SBO -Admitted to Med-Surge  -Gen Surg consulted by ER; currently no indication for surgical intervention -This may be PNA-associated ileus vs. SBO - treatment  is the same either way -NPO for bowel rest -NG Tube removed and general surgery recommending holding replacing for now but states that if he vomits or becomes nauseous will need to be replaced -IVF hydration as above with normal saline at a rate of 75 mL's per hour for now -Pain control with morphine 2 mg q2hprn -C/w Antiemetics with IV Zofran 4 mg q6h  -CT scan shows an SBO with transition in the mid ileal region -General surgery evaluated and administered SBO  yesterday and feels improved but this morning again worsened -General surgery recommending mobilization and augmented try bisacodyl 10 mg rectal suppository once -Continue to maintain electrolytes with potassium above 4 and magnesium above 2  Gastric adenocarcinoma/prostate CA -Gastric CA diagnosed in 10/17, s/p distal gastrectomy in 2018; he remains on active surveillance without evidence of recurrent disease. -s/p seed implants for prostate CA in 2013; continue Tamsulosin -We will need follow-up with oncology in the outpatient setting  Acute renal failure on CKD stage III, improving slowly Hyperphosphatemia, improved Metabolic acidosis, improving -Patient with acute infection (PNA) in addition to n/v with SBO - so very likely prerenal azotemia -He does have mild ketonuria -He has baseline stage 3-4 CKD but clearly markedly worse now -BUN/creatinine is improving went from 82/4.35 is now 79/3.19 -Will continue to rehydrate as above -Patient CO2 is 18, anion gap was 13; currently now CO2 is 21 and anion gap is 10 -Patient's phosphorus was 5.2 is now 4.2 -Continue to hold his Furosemide 80 milligrams every other day -Avoid nephrotoxic agents, contrast dyes, hypotension if possible Repeat CMP in a.m.  HTN -Continue with IV hydralazine 5 mg every 4 PRN for systolic blood pressure greater than 160 and currently holding p.o. hydralazine -Will change Bystolic to IV Labetalol, standing q6h; continue with labetalol 5 mg every 6 scheduled -Blood Pressure was elevated at 166/97 today  OSA -Does not wear CPAP  Obesity -Estimated body mass index is 30.81 kg/m as calculated from the following:   Height as of this encounter: 6\' 2"  (1.88 m).   Weight as of this encounter: 108.9 kg. -Weight Loss and Dietary Counseling Given  GERD/peptic ulcer disease/stomach cancer -Placed on empiric PPI IV given that he is on steroids and has a history of peptic ulcer disease  Normocytic Anemia/Anemia of  Chronic Kidney Disease -Patient hemoglobin/hematocrit went from 11.7/34.2 and is now 9.8/28.7 and question dilutional drop in the setting of IV fluid resuscitation -Check anemia panel in the a.m. -Continue to monitor for signs and symptoms of bleeding; currently no overt bleeding noted -Repeat CBC in the a.m.  DVT prophylaxis: Heparin 5,000 units sq q8h Code Status: FULL CODE  Family Communication: No family present at bedside  Disposition Plan: Maintain patient for continued management and treatment until patient tolerates p.o. and improved with his pneumonia as well as small bowel obstruction  Consultants:   General Surgery   Procedures: SBO protocol  Antimicrobials:  Anti-infectives (From admission, onward)   Start     Dose/Rate Route Frequency Ordered Stop   09/09/18 1100  cefTRIAXone (ROCEPHIN) 2 g in sodium chloride 0.9 % 100 mL IVPB     2 g 200 mL/hr over 30 Minutes Intravenous Every 24 hours 09/09/18 1051     09/09/18 1100  azithromycin (ZITHROMAX) 500 mg in sodium chloride 0.9 % 250 mL IVPB     500 mg 250 mL/hr over 60 Minutes Intravenous Every 24 hours 09/09/18 1051       Subjective: Seen and examined at bedside  states that he had a rough night.  Has a covers over him when I went to see him.  States that he wants to rest.  No chest pain, lightheadedness or abdominal distention and pain is improved but still not passing flatus very much and still not had a bowel movement.  No other concerns or complaints at this time and wants to ambulate later.  Objective: Vitals:   09/10/18 2056 09/11/18 0120 09/11/18 0519 09/11/18 0728  BP: (!) 161/91 (!) 148/95 (!) 155/95 (!) 166/97  Pulse: 84 92  80  Resp: 16 16    Temp: 98.4 F (36.9 C) 98.4 F (36.9 C)  97.8 F (36.6 C)  TempSrc: Oral Oral  Oral  SpO2: 96% 94%  92%  Weight:      Height:        Intake/Output Summary (Last 24 hours) at 09/11/2018 1535 Last data filed at 09/11/2018 1221 Gross per 24 hour  Intake 1600.45  ml  Output 2325 ml  Net -724.55 ml   Filed Weights   09/09/18 0737  Weight: 108.9 kg    Examination: Physical Exam:  Constitutional: Well-nourished, well-developed obese African-American male currently no acute distress appears calm and had the covers over his head when I walked in Eyes: Lids and conjunctive are normal.  Sclera and icteric ENMT: External ears and nose appear normal.  Grossly normal hearing.  NG tube has been removed Neck: Appears supple no JVD Respiratory: Diminished to Auscultation bilaterally with no appreciable wheezing, rales, rhonchi.  Patient not tachypneic or using any accessory muscles with Cardiovascular: Regular rate and rhythm.  No appreciable murmurs, rubs, gallops.  Has very trace lower extremity edema noted Abdomen: Soft, nontender, slightly tender to palpate.  Distended.  Bowel sounds GU: Deferred Musculoskeletal: No contractures or cyanosis.  No joint deformities in upper and lower extremities noted Skin: No appreciable rashes or lesions on to skin evaluation Neurologic: Cranial nerves II through XII grossly intact no appreciable focal deficits.  Romberg sign cerebellar reflexes were not assessed Psychiatric: Normal judgment and insight.  Patient is awake, alert, oriented x3.  Normal mood and affect  Data Reviewed: I have personally reviewed following labs and imaging studies  CBC: Recent Labs  Lab 09/09/18 0805 09/10/18 0737 09/11/18 0530  WBC 13.6* 11.9* 12.7*  NEUTROABS 10.9* 10.9* 10.3*  HGB 11.7* 10.2* 9.8*  HCT 34.2* 30.0* 28.7*  MCV 95.3 94.0 93.8  PLT 252 209 741   Basic Metabolic Panel: Recent Labs  Lab 09/09/18 0805 09/10/18 0737 09/11/18 0530  NA 134* 135 139  K 4.4 5.1 5.0  CL 99 104 108  CO2 20* 18* 21*  GLUCOSE 119* 118* 106*  BUN 82* 78* 79*  CREATININE 4.35* 3.39* 3.19*  CALCIUM 10.1 9.6 9.8  MG  --  2.3 2.6*  PHOS  --  5.2* 4.2   GFR: Estimated Creatinine Clearance: 27.5 mL/min (A) (by C-G formula based on SCr  of 3.19 mg/dL (H)). Liver Function Tests: Recent Labs  Lab 09/09/18 0805 09/10/18 0737 09/11/18 0530  AST 16 16 13*  ALT 11 11 11   ALKPHOS 79 62 57  BILITOT 1.4* 0.7 0.7  PROT 7.4 6.6 6.4*  ALBUMIN 3.0* 2.5* 2.5*   Recent Labs  Lab 09/09/18 0805  LIPASE 39   No results for input(s): AMMONIA in the last 168 hours. Coagulation Profile: No results for input(s): INR, PROTIME in the last 168 hours. Cardiac Enzymes: No results for input(s): CKTOTAL, CKMB, CKMBINDEX, TROPONINI in the last 168  hours. BNP (last 3 results) No results for input(s): PROBNP in the last 8760 hours. HbA1C: No results for input(s): HGBA1C in the last 72 hours. CBG: No results for input(s): GLUCAP in the last 168 hours. Lipid Profile: No results for input(s): CHOL, HDL, LDLCALC, TRIG, CHOLHDL, LDLDIRECT in the last 72 hours. Thyroid Function Tests: No results for input(s): TSH, T4TOTAL, FREET4, T3FREE, THYROIDAB in the last 72 hours. Anemia Panel: No results for input(s): VITAMINB12, FOLATE, FERRITIN, TIBC, IRON, RETICCTPCT in the last 72 hours. Sepsis Labs: Recent Labs  Lab 09/09/18 1128 09/09/18 1917  PROCALCITON  --  10.85  LATICACIDVEN 1.1  --     Recent Results (from the past 240 hour(s))  SARS Coronavirus 2 [facility] order, Performed in [facility] hospital lab) Nasopharyngeal Nasopharyngeal Swab     Status: None   Collection Time: 09/09/18  8:10 AM   Specimen: Nasopharyngeal Swab  Result Value Ref Range Status   SARS Coronavirus 2 NEGATIVE NEGATIVE Final    Comment: (NOTE) If result is NEGATIVE SARS-CoV-2 target nucleic acids are NOT DETECTED. The SARS-CoV-2 RNA is generally detectable in upper and lower  respiratory specimens during the acute phase of infection. The lowest  concentration of SARS-CoV-2 viral copies this assay can detect is 250  copies / mL. A negative result does not preclude SARS-CoV-2 infection  and should not be used as the sole basis for treatment or other   patient management decisions.  A negative result may occur with  improper specimen collection / handling, submission of specimen other  than nasopharyngeal swab, presence of viral mutation(s) within the  areas targeted by this assay, and inadequate number of viral copies  (<250 copies / mL). A negative result must be combined with clinical  observations, patient history, and epidemiological information. If result is POSITIVE SARS-CoV-2 target nucleic acids are DETECTED. The SARS-CoV-2 RNA is generally detectable in upper and lower  respiratory specimens dur ing the acute phase of infection.  Positive  results are indicative of active infection with SARS-CoV-2.  Clinical  correlation with patient history and other diagnostic information is  necessary to determine patient infection status.  Positive results do  not rule out bacterial infection or co-infection with other viruses. If result is PRESUMPTIVE POSTIVE SARS-CoV-2 nucleic acids MAY BE PRESENT.   A presumptive positive result was obtained on the submitted specimen  and confirmed on repeat testing.  While 2019 novel coronavirus  (SARS-CoV-2) nucleic acids may be present in the submitted sample  additional confirmatory testing may be necessary for epidemiological  and / or clinical management purposes  to differentiate between  SARS-CoV-2 and other Sarbecovirus currently known to infect humans.  If clinically indicated additional testing with an alternate test  methodology [phone]) is advised. The SARS-CoV-2 RNA is generally  detectable in upper and lower respiratory sp ecimens during the acute  phase of infection. The expected result is Negative. Fact Sheet for Patients:  StrictlyIdeas.no Fact Sheet for Healthcare Providers: BankingDealers.co.za This test is not yet approved or cleared by the [place] FDA and has been authorized for detection and/or diagnosis of SARS-CoV-2  by FDA under an Emergency Use Authorization (EUA).  This EUA will remain in effect (meaning this test can be used) for the duration of the COVID-19 declaration under Section 564(b)(1) of the Act, 21 U.S.C. section 360bbb-3(b)(1), unless the authorization is terminated or revoked sooner. Performed at [facility], [place] [address], [place], [place] [address]   Urine culture  Status: Abnormal   Collection Time: 09/09/18 10:03 AM   Specimen: Urine, Clean Catch  Result Value Ref Range Status   Specimen Description URINE, CLEAN CATCH  Final   Special Requests   Final    NONE Performed at [facility], [address], [place], [place] [address]    Culture 20,000 COLONIES/mL PROTEUS MIRABILIS (A)  Final   Report Status 09/11/2018 FINAL  Final   Organism ID, Bacteria PROTEUS MIRABILIS (A)  Final      Susceptibility   Proteus mirabilis - MIC*    AMPICILLIN <=2 SENSITIVE Sensitive     CEFAZOLIN <=4 SENSITIVE Sensitive     CEFTRIAXONE <=1 SENSITIVE Sensitive     CIPROFLOXACIN <=0.25 SENSITIVE Sensitive     GENTAMICIN <=1 SENSITIVE Sensitive     IMIPENEM 2 SENSITIVE Sensitive     NITROFURANTOIN 128 RESISTANT Resistant     TRIMETH/SULFA <=20 SENSITIVE Sensitive     AMPICILLIN/SULBACTAM <=2 SENSITIVE Sensitive     PIP/TAZO <=4 SENSITIVE Sensitive     * 20,000 COLONIES/mL PROTEUS MIRABILIS  Blood Culture (routine x 2)     Status: None (Preliminary result)   Collection Time: 09/09/18 11:20 AM   Specimen: BLOOD  Result Value Ref Range Status   Specimen Description BLOOD LEFT ANTECUBITAL  Final   Special Requests   Final    BOTTLES DRAWN AEROBIC AND ANAEROBIC Blood Culture adequate volume   Culture   Final    NO GROWTH 2 DAYS Performed at [facility], [address] [place], [place] [address]    Report Status PENDING  Incomplete  Blood Culture (routine x 2)     Status: None (Preliminary result)   Collection Time: 09/09/18 11:22 AM   Specimen: BLOOD LEFT HAND   Result Value Ref Range Status   Specimen Description BLOOD LEFT HAND  Final   Special Requests   Final    BOTTLES DRAWN AEROBIC ONLY Blood Culture results may not be optimal due to an inadequate volume of blood received in culture bottles   Culture   Final    NO GROWTH 2 DAYS Performed at [facility], [place] [address] [place], [place] [address]    Report Status PENDING  Incomplete    Radiology Studies: Dg Chest Port 1 View  Result Date: 09/10/2018 CLINICAL DATA:  73 year old male with shortness of breath and abdominal pain. EXAM: PORTABLE CHEST 1 VIEW COMPARISON:  09/09/2018 and earlier. FINDINGS: Portable AP semi upright view at 1610 hours. Stable left chest Port-A-Cath. Enteric tube has been placed and courses to the abdomen, tip not included. Continued low lung volumes. Stable cardiac size and mediastinal contours. Visualized tracheal air column is within normal limits. No pneumothorax. No pulmonary edema. No definite pleural effusion. Streaky and confluent bibasilar opacity is stable. Paucity of bowel gas in the upper abdomen. No acute osseous abnormality identified. IMPRESSION: 1. Continued low lung volumes with stable bibasilar opacity. Top differential considerations are atelectasis and infection. 2. No new cardiopulmonary abnormality. Electronically Signed   By: Genevie Ann M.D.   On: 09/10/2018 16:27   Dg Abd Portable 1v  Result Date: 09/11/2018 CLINICAL DATA:  Follow-up small bowel obstruction EXAM: PORTABLE ABDOMEN - 1 VIEW COMPARISON:  09/10/2018 FINDINGS: Single image shows some persistent fluid and air dilatation of the small intestine consistent with persistent small-bowel obstruction. No significant change suspected based on this single image. IMPRESSION: Persistent small bowel obstruction pattern. Electronically Signed   By: Jan Fireman.D.  On: 09/11/2018 08:20   Dg Abd Portable 1v  Result Date: 09/11/2018 CLINICAL DATA:  Nasogastric tube placement EXAM: PORTABLE ABDOMEN  - 1 VIEW COMPARISON:  09/10/2018 FINDINGS: The bowel gas pattern is normal. No radio-opaque calculi or other significant radiographic abnormality are seen. IMPRESSION: No nasogastric tube is visualized. It may be coiled in the esophagus. Electronically Signed   By: Ulyses Jarred M.D.   On: 09/11/2018 00:11   Dg Abd Portable 1v  Result Date: 09/10/2018 CLINICAL DATA:  NG tube placement EXAM: PORTABLE ABDOMEN - 1 VIEW COMPARISON:  09/10/2018 FINDINGS: NG tube is not visualized in the mid to lower chest or abdomen. Bibasilar atelectasis. Nonobstructive bowel-gas crash that mildly prominent left abdominal small bowel loops again noted. IMPRESSION: No NG tube visualized. Electronically Signed   By: Rolm Baptise M.D.   On: 09/10/2018 23:47   Dg Abd Portable 1v-small Bowel Obstruction Protocol-initial, 8 Hr Delay  Result Date: 09/10/2018 CLINICAL DATA:  Small bowel obstruction EXAM: PORTABLE ABDOMEN - 1 VIEW COMPARISON:  09/10/2018 FINDINGS: Dilated small bowel loops in the left abdomen. Overall degree of bowel distention decreased since prior study. NG tube tip is seen in the stomach. Oral contrast material is seen in the fundus of the stomach. Moderate stool burden in the colon. IMPRESSION: Small bowel obstruction pattern, slightly improved with NG tube in the stomach. Electronically Signed   By: Rolm Baptise M.D.   On: 09/10/2018 18:48   Dg Abd Portable 1v  Result Date: 09/10/2018 CLINICAL DATA:  Small bowel obstruction. EXAM: PORTABLE ABDOMEN - 1 VIEW COMPARISON:  Radiograph of September 09, 2018. FINDINGS: Stable small bowel dilatation is noted in left upper quadrant concerning for ileus or distal small bowel obstruction. No colonic dilatation is noted. No abnormal calcifications are noted. Nasogastric tube tip is again noted in proximal stomach. IMPRESSION: Stable small bowel dilatation is noted in left upper quadrant concerning for ileus or distal small bowel obstruction. Continued radiographic follow-up is  recommended. Electronically Signed   By: Marijo Conception M.D.   On: 09/10/2018 08:57   Dg Abd Portable 1 View  Result Date: 09/09/2018 CLINICAL DATA:  Nasogastric placement. EXAM: PORTABLE ABDOMEN - 1 VIEW COMPARISON:  04/17/2018 FINDINGS: Nasogastric tube tip is in the midportion of the stomach. Mild small bowel dilatation, less than on the recent CT. IMPRESSION: Nasogastric tube tip in the midportion of the stomach. Decrease in small bowel dilatation. Electronically Signed   By: Nelson Chimes M.D.   On: 09/09/2018 16:12   Scheduled Meds: . heparin  5,000 Units Subcutaneous Q8H  . labetalol  5 mg Intravenous Q6H  . methylPREDNISolone (SOLU-MEDROL) injection  40 mg Intravenous Q24H  . pantoprazole (PROTONIX) IV  40 mg Intravenous Q24H   Continuous Infusions: . sodium chloride 75 mL/hr at 09/11/18 0147  . azithromycin 500 mg (09/11/18 1221)  . cefTRIAXone (ROCEPHIN)  IV 2 g (09/11/18 1022)    LOS: 2 days   Kerney Elbe, DO [facility] PAGER is on [place]  If 7PM-7AM, please contact night-coverage [email or web address] Password [email or web address] 09/11/2018, 3:35 PM

## 2018-09-11 NOTE — Progress Notes (Signed)
Given scheduled bp meds first then prn's.  NG tube pulled out night MD Aware may have to be placed back in by MD or surgery like in the ED d/t pass CA

## 2018-09-12 ENCOUNTER — Inpatient Hospital Stay (HOSPITAL_COMMUNITY): Payer: Medicare Other

## 2018-09-12 DIAGNOSIS — E875 Hyperkalemia: Secondary | ICD-10-CM

## 2018-09-12 LAB — RETICULOCYTES
Immature Retic Fract: 14 % (ref 2.3–15.9)
RBC.: 3.19 MIL/uL — ABNORMAL LOW (ref 4.22–5.81)
Retic Count, Absolute: 70.2 10*3/uL (ref 19.0–186.0)
Retic Ct Pct: 2.2 % (ref 0.4–3.1)

## 2018-09-12 LAB — BASIC METABOLIC PANEL
Anion gap: 12 (ref 5–15)
BUN: 77 mg/dL — ABNORMAL HIGH (ref 8–23)
CO2: 19 mmol/L — ABNORMAL LOW (ref 22–32)
Calcium: 9.9 mg/dL (ref 8.9–10.3)
Chloride: 111 mmol/L (ref 98–111)
Creatinine, Ser: 3.11 mg/dL — ABNORMAL HIGH (ref 0.61–1.24)
GFR calc Af Amer: 22 mL/min — ABNORMAL LOW (ref >60)
GFR calc non Af Amer: 19 mL/min — ABNORMAL LOW (ref >60)
Glucose, Bld: 105 mg/dL — ABNORMAL HIGH (ref 70–99)
Potassium: 5.1 mmol/L (ref 3.5–5.1)
Sodium: 142 mmol/L (ref 135–145)

## 2018-09-12 LAB — COMPREHENSIVE METABOLIC PANEL
ALT: 11 U/L (ref 0–44)
AST: 24 U/L (ref 15–41)
Albumin: 2.7 g/dL — ABNORMAL LOW (ref 3.5–5.0)
Alkaline Phosphatase: 59 U/L (ref 38–126)
Anion gap: 13 (ref 5–15)
BUN: 80 mg/dL — ABNORMAL HIGH (ref 8–23)
CO2: 19 mmol/L — ABNORMAL LOW (ref 22–32)
Calcium: 10 mg/dL (ref 8.9–10.3)
Chloride: 108 mmol/L (ref 98–111)
Creatinine, Ser: 3.22 mg/dL — ABNORMAL HIGH (ref 0.61–1.24)
GFR calc Af Amer: 21 mL/min — ABNORMAL LOW (ref >60)
GFR calc non Af Amer: 18 mL/min — ABNORMAL LOW (ref >60)
Glucose, Bld: 94 mg/dL (ref 70–99)
Potassium: 5.9 mmol/L — ABNORMAL HIGH (ref 3.5–5.1)
Sodium: 140 mmol/L (ref 135–145)
Total Bilirubin: 1.3 mg/dL — ABNORMAL HIGH (ref 0.3–1.2)
Total Protein: 6.3 g/dL — ABNORMAL LOW (ref 6.5–8.1)

## 2018-09-12 LAB — CBC WITH DIFFERENTIAL/PLATELET
Abs Immature Granulocytes: 0.72 10*3/uL — ABNORMAL HIGH (ref 0.00–0.07)
Basophils Absolute: 0 10*3/uL (ref 0.0–0.1)
Basophils Relative: 0 %
Eosinophils Absolute: 0 10*3/uL (ref 0.0–0.5)
Eosinophils Relative: 0 %
HCT: 30.1 % — ABNORMAL LOW (ref 39.0–52.0)
Hemoglobin: 10.3 g/dL — ABNORMAL LOW (ref 13.0–17.0)
Immature Granulocytes: 7 %
Lymphocytes Relative: 9 %
Lymphs Abs: 0.9 10*3/uL (ref 0.7–4.0)
MCH: 32.3 pg (ref 26.0–34.0)
MCHC: 34.2 g/dL (ref 30.0–36.0)
MCV: 94.4 fL (ref 80.0–100.0)
Monocytes Absolute: 0.7 10*3/uL (ref 0.1–1.0)
Monocytes Relative: 7 %
Neutro Abs: 7.9 10*3/uL — ABNORMAL HIGH (ref 1.7–7.7)
Neutrophils Relative %: 77 %
Platelets: 283 10*3/uL (ref 150–400)
RBC: 3.19 MIL/uL — ABNORMAL LOW (ref 4.22–5.81)
RDW: 15.4 % (ref 11.5–15.5)
WBC: 10.3 10*3/uL (ref 4.0–10.5)
nRBC: 0 % (ref 0.0–0.2)

## 2018-09-12 LAB — VITAMIN B12: Vitamin B-12: 404 pg/mL (ref 180–914)

## 2018-09-12 LAB — FERRITIN: Ferritin: 396 ng/mL — ABNORMAL HIGH (ref 24–336)

## 2018-09-12 LAB — IRON AND TIBC
Iron: 108 ug/dL (ref 45–182)
Saturation Ratios: 41 % — ABNORMAL HIGH (ref 17.9–39.5)
TIBC: 262 ug/dL (ref 250–450)
UIBC: 154 ug/dL

## 2018-09-12 LAB — MAGNESIUM: Magnesium: 2.7 mg/dL — ABNORMAL HIGH (ref 1.7–2.4)

## 2018-09-12 LAB — FOLATE: Folate: 13.6 ng/mL (ref >5.9)

## 2018-09-12 LAB — PHOSPHORUS: Phosphorus: 4.5 mg/dL (ref 2.5–4.6)

## 2018-09-12 IMAGING — DX PORTABLE CHEST - 1 VIEW
1 series · 1 of 1 positions shown · non-contrast
Comparison: Chest x-ray dated [DATE].

CLINICAL DATA: Pneumonia.

EXAM:
PORTABLE CHEST 1 VIEW

[chest]
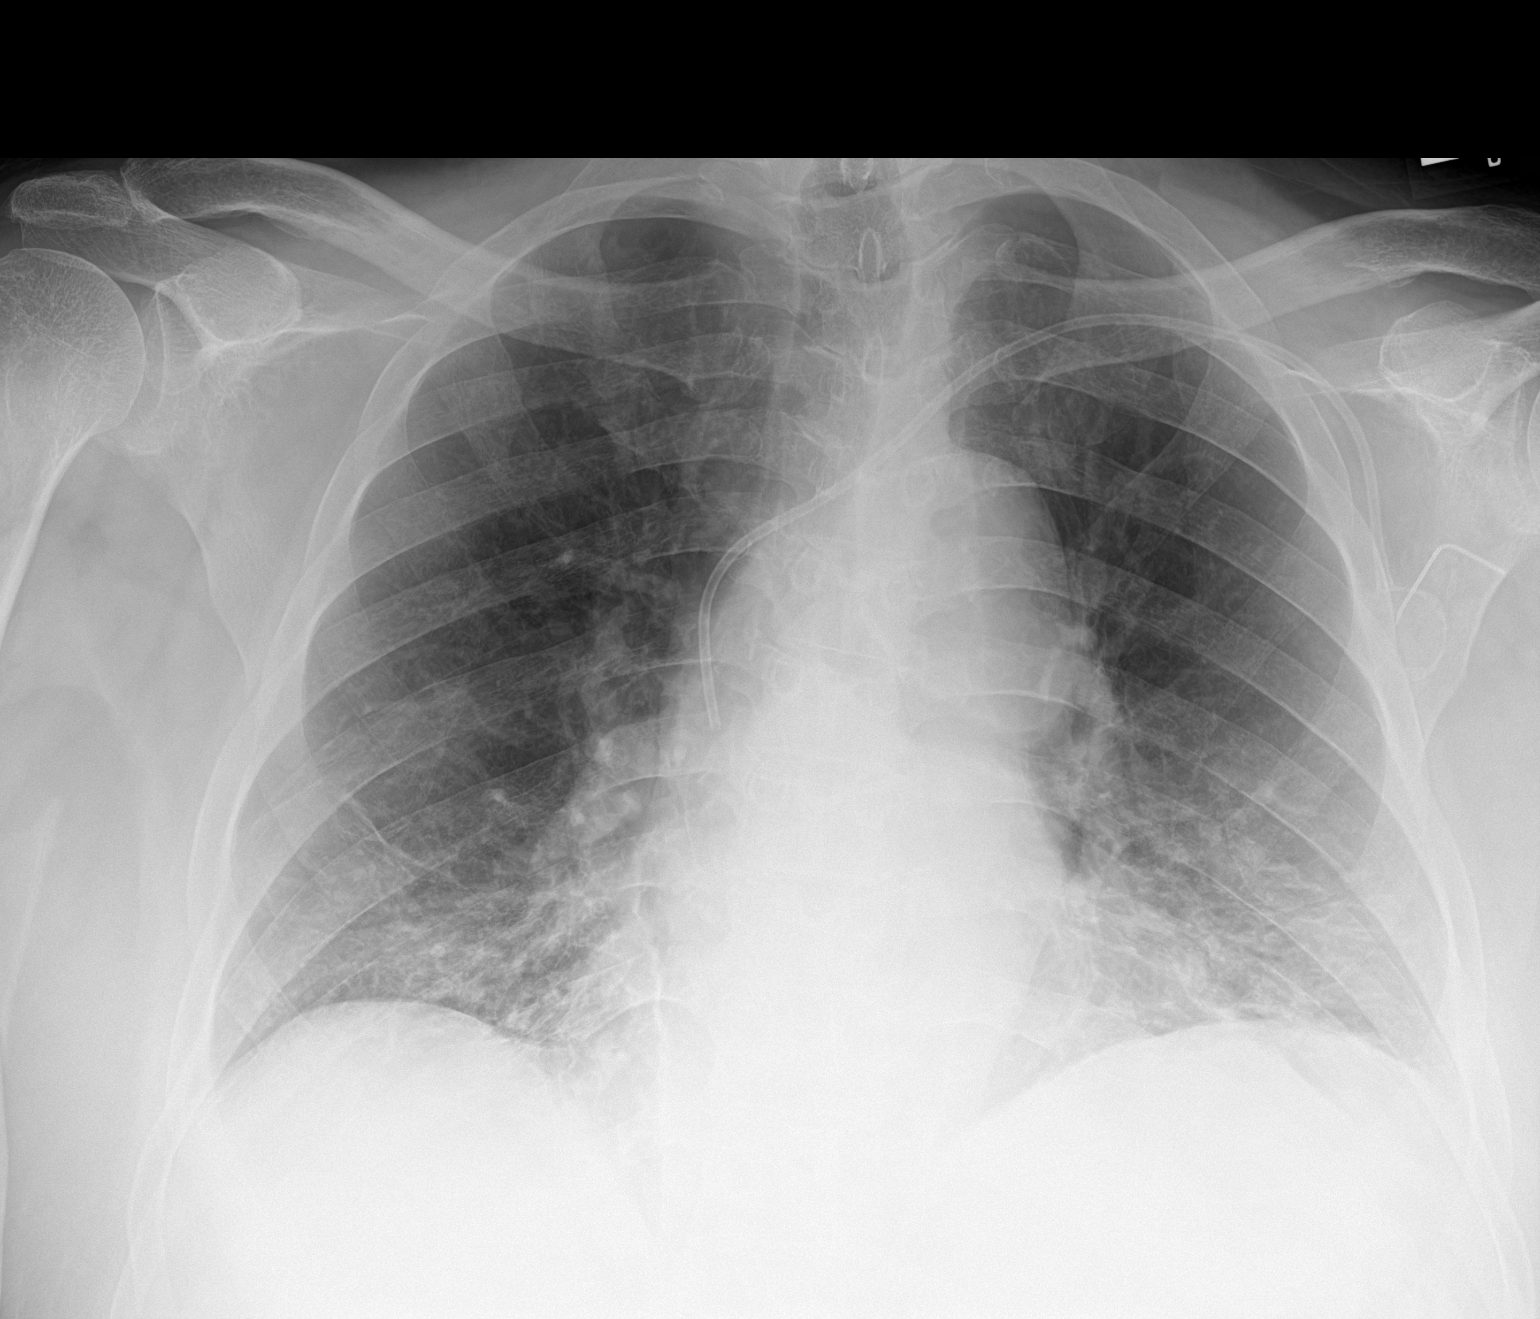

[1 of 1 positions shown; findings below may reference images not displayed]

FINDINGS: Interval removal of the enteric tube. Unchanged left chest wall port
catheter. Stable cardiomediastinal silhouette. Normal pulmonary
vascularity. Streaky bibasilar opacities have minimally improved. No
pleural effusion or pneumothorax. No acute osseous abnormality.
IMPRESSION: 1. Minimally improved streaky bibasilar opacities potentially
reflecting resolving pneumonia and/or atelectasis.

## 2018-09-12 IMAGING — DX PORTABLE ABDOMEN - 1 VIEW
1 series · 1 of 1 positions shown · non-contrast
Comparison: Abdominal x-ray from yesterday.

CLINICAL DATA: Small bowel obstruction.

EXAM:
PORTABLE ABDOMEN - 1 VIEW

[abdomen]
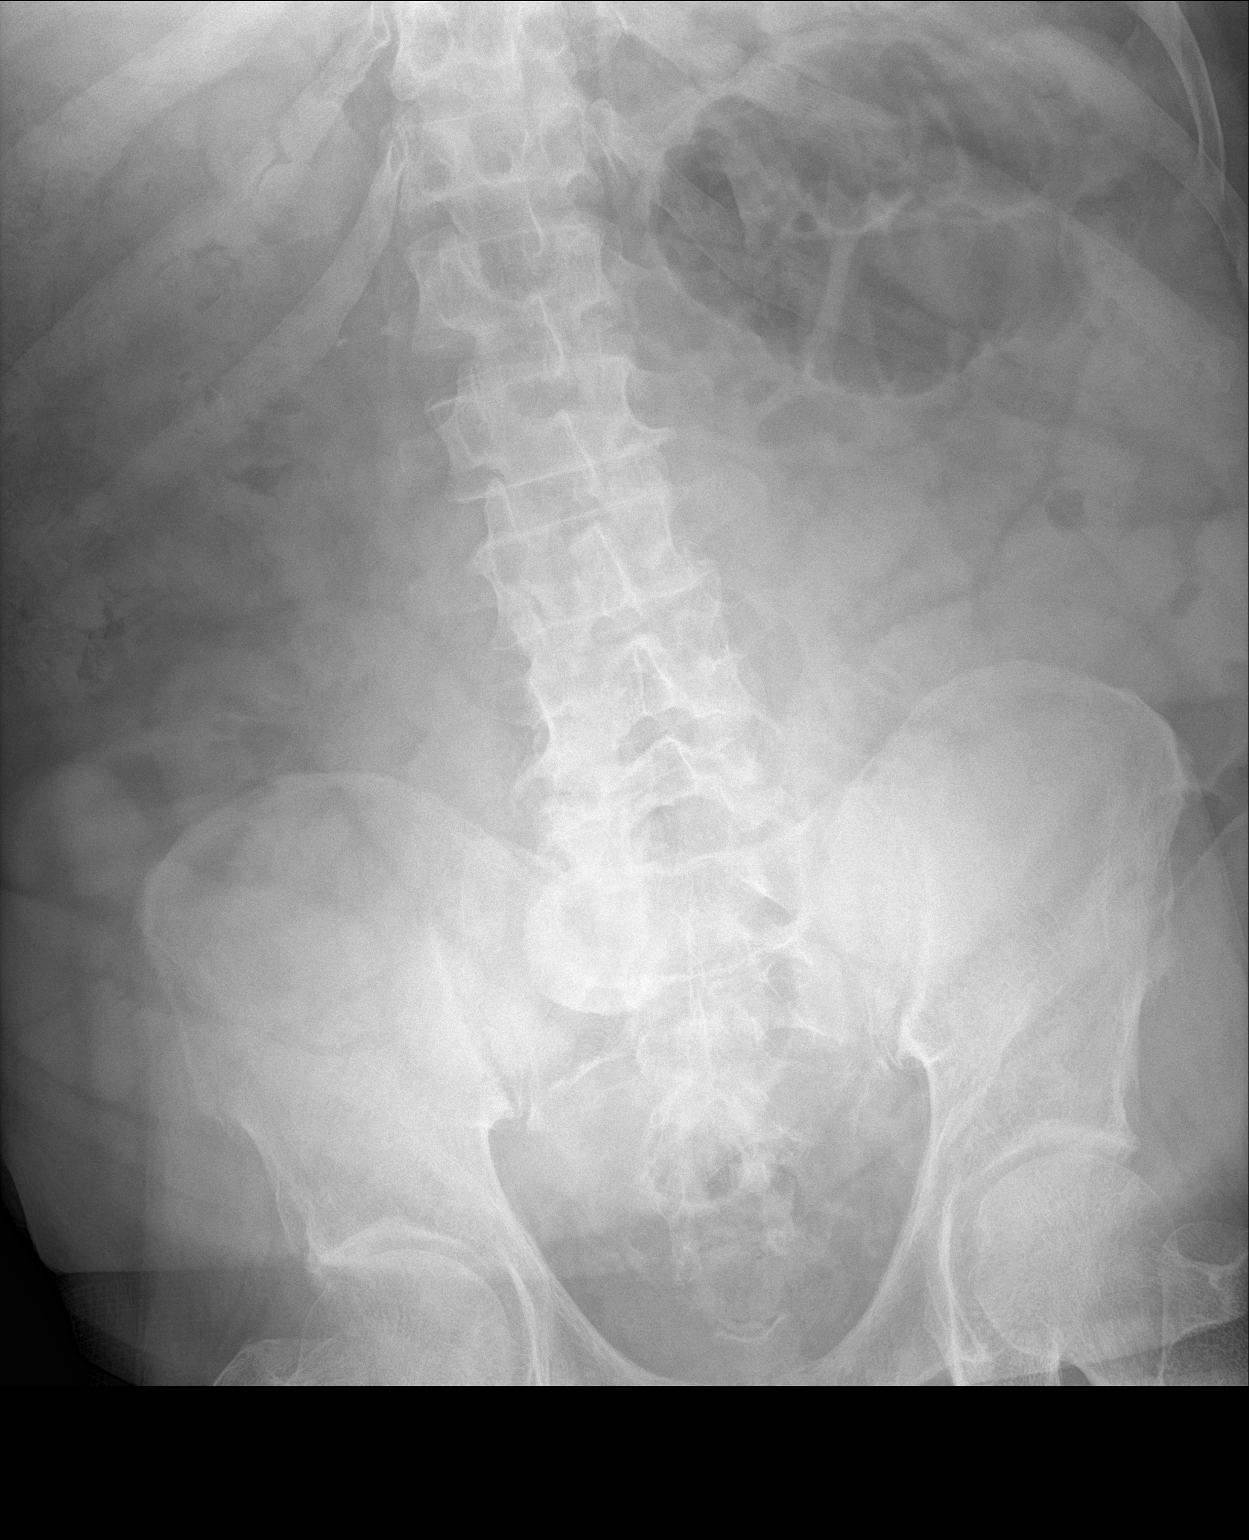

[1 of 1 positions shown; findings below may reference images not displayed]

FINDINGS: Dilated fluid and air-filled small bowel loops are similar to
yesterday. No radiopaque calculi or other significant abnormality.
No acute osseous abnormality.
IMPRESSION: 1. Unchanged small bowel obstruction.

## 2018-09-12 MED ORDER — BISACODYL 10 MG RE SUPP
10.0000 mg | Freq: Once | RECTAL | Status: AC
  Administered 2018-09-12: 10 mg via RECTAL
  Filled 2018-09-12: qty 1

## 2018-09-12 MED ORDER — SODIUM ZIRCONIUM CYCLOSILICATE 10 G PO PACK
10.0000 g | PACK | Freq: Once | ORAL | Status: AC
  Administered 2018-09-12: 10 g via ORAL
  Filled 2018-09-12: qty 1

## 2018-09-12 NOTE — Progress Notes (Signed)
Daily Nursing Note  Received report from [place], Therapist, sports. Introduced self to patient who was in no distress, vocalized feeling well overall.   Patient mobilized in the hall x3.   Surgical team came by to see patient, verbalized that patient can have ice chips with sips and if he should become more distended then we should pursue NGT placement.   Suppository given BM x2 (small)  K of 5.9 --> Lokelma administered. BMP redrawn K --> 5.1. Poor kidney function h/o CKD Stg 3  Patient had x1 small bowel movement   Increased SBP to 170 in AM, labetolol given IV w/ improvement  MEWS scores remained GREEN throughout the day  All patient needs addressed throughout the day.  Freada Bergeron Fatemah Pourciau 09/12/2018,12:39 PM

## 2018-09-12 NOTE — Plan of Care (Signed)

## 2018-09-12 NOTE — Progress Notes (Signed)
[facility] Progress Note     Subjective: CC: sbo Patient denies abdominal pain this AM. Denies nausea or emesis. Patient reports small BM overnight, per RN he had a blowout.   Objective: Vital signs in last 24 hours: Temp:  [98.2 F (36.8 C)-99.1 F (37.3 C)] 99.1 F (37.3 C) (08/30 0729) Pulse Rate:  [58-78] 58 (08/30 0729) Resp:  [16] 16 (08/30 0729) BP: (159-170)/(94-95) 170/94 (08/30 0729) SpO2:  [92 %-98 %] 98 % (08/30 0729) Last BM Date: 09/12/18  Intake/Output from previous day: 08/29 0701 - 08/30 0700 In: -  Out: 1200 [Urine:1200] Intake/Output this shift: No intake/output data recorded.  PE: Gen: Alert, NAD, pleasant Card: Regular rate and rhythm Pulm: Normal effort, nasal cannula present Abd: Soft, non-tender, moderately distended,hypoactive BS Skin: warm and dry, no rashes  Psych: A&Ox3   Lab Results:  Recent Labs    09/11/18 0530 09/12/18 0714  WBC 12.7* 10.3  HGB 9.8* 10.3*  HCT 28.7* 30.1*  PLT 261 283   BMET Recent Labs    09/11/18 0530 09/12/18 0714  NA 139 140  K 5.0 5.9*  CL 108 108  CO2 21* 19*  GLUCOSE 106* 94  BUN 79* 80*  CREATININE 3.19* 3.22*  CALCIUM 9.8 10.0   PT/INR No results for input(s): LABPROT, INR in the last 72 hours. CMP     Component Value Date/Time   NA 140 09/12/2018 0714   NA 141 01/16/2017 0924   K 5.9 (H) 09/12/2018 0714   K 4.8 01/16/2017 0924   CL 108 09/12/2018 0714   CO2 19 (L) 09/12/2018 0714   CO2 25 01/16/2017 0924   GLUCOSE 94 09/12/2018 0714   GLUCOSE 80 01/16/2017 0924   BUN 80 (H) 09/12/2018 0714   BUN 24.0 01/16/2017 0924   CREATININE 3.22 (H) 09/12/2018 0714   CREATININE 2.46 (H) 04/15/2018 1158   CREATININE 1.9 (H) 01/16/2017 0924   CALCIUM 10.0 09/12/2018 0714   CALCIUM 9.8 01/16/2017 0924   PROT 6.3 (L) 09/12/2018 0714   PROT 7.1 01/16/2017 0924   ALBUMIN 2.7 (L) 09/12/2018 0714   ALBUMIN 3.2 (L) 01/16/2017 0924   AST 24 09/12/2018 0714   AST 18 04/15/2018  1158   AST 16 01/16/2017 0924   ALT 11 09/12/2018 0714   ALT 13 04/15/2018 1158   ALT 10 01/16/2017 0924   ALKPHOS 59 09/12/2018 0714   ALKPHOS 54 01/16/2017 0924   BILITOT 1.3 (H) 09/12/2018 0714   BILITOT 0.4 04/15/2018 1158   BILITOT 0.54 01/16/2017 0924   GFRNONAA 18 (L) 09/12/2018 0714   GFRNONAA 25 (L) 04/15/2018 1158   GFRAA 21 (L) 09/12/2018 0714   GFRAA 29 (L) 04/15/2018 1158   Lipase     Component Value Date/Time   LIPASE 39 09/09/2018 0805       Studies/Results: Dg Chest Port 1 View  Result Date: 09/10/2018 CLINICAL DATA:  73 year old male with shortness of breath and abdominal pain. EXAM: PORTABLE CHEST 1 VIEW COMPARISON:  09/09/2018 and earlier. FINDINGS: Portable AP semi upright view at 1610 hours. Stable left chest Port-A-Cath. Enteric tube has been placed and courses to the abdomen, tip not included. Continued low lung volumes. Stable cardiac size and mediastinal contours. Visualized tracheal air column is within normal limits. No pneumothorax. No pulmonary edema. No definite pleural effusion. Streaky and confluent bibasilar opacity is stable. Paucity of bowel gas in the upper abdomen. No acute osseous abnormality identified. IMPRESSION: 1. Continued low lung volumes with stable bibasilar  opacity. Top differential considerations are atelectasis and infection. 2. No new cardiopulmonary abnormality. Electronically Signed   By: Genevie Ann M.D.   On: 09/10/2018 16:27   Dg Abd Portable 1v  Result Date: 09/11/2018 CLINICAL DATA:  Follow-up small bowel obstruction EXAM: PORTABLE ABDOMEN - 1 VIEW COMPARISON:  09/10/2018 FINDINGS: Single image shows some persistent fluid and air dilatation of the small intestine consistent with persistent small-bowel obstruction. No significant change suspected based on this single image. IMPRESSION: Persistent small bowel obstruction pattern. Electronically Signed   By: Nelson Chimes M.D.   On: 09/11/2018 08:20   Dg Abd Portable 1v  Result  Date: 09/11/2018 CLINICAL DATA:  Nasogastric tube placement EXAM: PORTABLE ABDOMEN - 1 VIEW COMPARISON:  09/10/2018 FINDINGS: The bowel gas pattern is normal. No radio-opaque calculi or other significant radiographic abnormality are seen. IMPRESSION: No nasogastric tube is visualized. It may be coiled in the esophagus. Electronically Signed   By: Ulyses Jarred M.D.   On: 09/11/2018 00:11   Dg Abd Portable 1v  Result Date: 09/10/2018 CLINICAL DATA:  NG tube placement EXAM: PORTABLE ABDOMEN - 1 VIEW COMPARISON:  09/10/2018 FINDINGS: NG tube is not visualized in the mid to lower chest or abdomen. Bibasilar atelectasis. Nonobstructive bowel-gas crash that mildly prominent left abdominal small bowel loops again noted. IMPRESSION: No NG tube visualized. Electronically Signed   By: Rolm Baptise M.D.   On: 09/10/2018 23:47   Dg Abd Portable 1v-small Bowel Obstruction Protocol-initial, 8 Hr Delay  Result Date: 09/10/2018 CLINICAL DATA:  Small bowel obstruction EXAM: PORTABLE ABDOMEN - 1 VIEW COMPARISON:  09/10/2018 FINDINGS: Dilated small bowel loops in the left abdomen. Overall degree of bowel distention decreased since prior study. NG tube tip is seen in the stomach. Oral contrast material is seen in the fundus of the stomach. Moderate stool burden in the colon. IMPRESSION: Small bowel obstruction pattern, slightly improved with NG tube in the stomach. Electronically Signed   By: Rolm Baptise M.D.   On: 09/10/2018 18:48    Anti-infectives: Anti-infectives (From admission, onward)   Start     Dose/Rate Route Frequency Ordered Stop   09/09/18 1100  cefTRIAXone (ROCEPHIN) 2 g in sodium chloride 0.9 % 100 mL IVPB     2 g 200 mL/hr over 30 Minutes Intravenous Every 24 hours 09/09/18 1051     09/09/18 1100  azithromycin (ZITHROMAX) 500 mg in sodium chloride 0.9 % 250 mL IVPB     500 mg 250 mL/hr over 60 Minutes Intravenous Every 24 hours 09/09/18 1051         Assessment/Plan Hx of gastric cancer - s/p  distal gastrectomy in 2018 Dr. Barry Dienes Hx of prostate cancer - s/p radiation AKI on CKD - Cr3.22,improving, IVF Constipation BPH GERD HTN ?COPD - 2ppd smoking hx  Multifocal pneumonia- seen on CXR and CT, COVID negative, perprimaryservice  SBO vs ileus - CT with sbo with transition in mid ileal region - hx of prior abdominal surgery so could be sbo vs reactive ileus from PNA - can leave NGT out for now but if patient becomes nauseated or vomits would replace NGT - film this AM unchanged but clinically patient feeling better and had some bowel function - try ice chips and sips of clears - repeat suppository today - MOBILIZE!!!!  FEN: ice chips/sips, IVF  VTE: SCDs,SQ heparin ID: azithromycin/rocephin  LOS: 3 days    Brigid Re , [facility] 09/12/2018, 9:17 AM Pager: [phone] Consults: [phone] 7:00 AM -  4:30 PM M, W-F 7:00 AM - 11:30 AM Tues, Sat, Sun

## 2018-09-12 NOTE — Progress Notes (Signed)
PROGRESS NOTE    Jacob [name]  LGX:[id number] DOB: [birth year] DOA: 09/09/2018 PCP: Prince Solian, MD   Brief Narrative:  HPI per Dr. Karmen Bongo on 09/09/2018 Jacob [name] is a 73 y.o. male with medical history significant of stomach cancer in remission; OSA not on CPAP; prostate CA (2013); HTN; HLD; CKD; and BPH presenting with abdominal pain.  He reports that he was constipated and felt n/v.  He has been feeling bloated and nauseated for the last 3-4 days.  He was seen by his PCP yesterday for SOB and hemoptysis and was given an antibiotic for multifocal PNA.  No fever.  No known COVID contacts.  He reports that he does wear a mask when out in public.   ED Course:  Presented with multifocal PNA (COVID negative) and SBO.  2-3 days of cough with sputum, CXR yesterday with PCP with B PNA and given abx.  Today with n/v.  Code sepsis, given small IVF bolus.  Creatinine 2-> 4.35.  CT with SBO.  Surgery called and will place NGT.  **Interim History Patient is feeling a little bit better today no shortness of breath but still complaining of some abdominal tenderness.  Underwent a small bowel protocol via his NG tube by general surgery but pulled NG out and refused to have his NG tube replaced.  General surgery evaluated and recommended continue to leave out his NG tube and recommending ambulation for mobilization as well as trying suppository today and again today.  Clinically patient is appearing to be better however film still shows an SBO.  Patient had a bowel movement and states it was small but nursing states it was a large bowel movement.  Potassium this morning was elevated and will give a dose of Lokelma and repeat BMP  Assessment & Plan:   Principal Problem:   Pneumonia Active Problems:   Essential hypertension   Gastric adenocarcinoma ([place])   Acute renal failure superimposed on stage 3 chronic kidney disease (HCC)   SBO (small bowel obstruction) (HCC)   PNA   -Patient was seen by his PCP and diagnosed with multifocal PNA -He was given PO Cefdinir -CXR indicates multifocal PNA, but it is not clear if this is significantly worse and led to ileus or if his SBO was the driving force for hospitalization -COVID negative. -CURB-65 score is 2 - admitted the patient to Med Surg. -Pneumonia Severity Index (PSI) is Class 4, 9% mortality. -Corticosteroids have been to shown to low overall mortality rate; risk of ARDS; and need for mechanical ventilation.  This is particularly true in severe PNA (class 3+ PSI).  Will add Solumedrol 40 mg daily for now and continue. -Started Azithromycin 500 mg daily AND Rocephin due to no risk factors for MDR cause. -NS @ 75cc/hr and will continue as patient is NPO but surgery has advance his diet to sips and chips -Fever control with Antipyretics of Acetaminophen 650 mg po q6hprn -Repeat CBC in am -Sputum cultures pending; urine culture obtained and showed 20,000 colonies of Proteus mirabilis -Blood cultures x2 showed NGTD at 2 Day  -Strep pneumo testing was negative  -PCT was 10.85 -WBC is now trending downwards and is 10.3 today and improved -Repeat CXR this a.m. showed "Minimally improved streaky bibasilar opacities potentially reflecting resolving pneumonia and/or atelectasis." -Will start DuoNeb PRN -C/w IV Soulmedrol 40 mg q24h for now and likely can transition to p.o. once he is taken more and  SBO -Patient with prior h/o abdominal surgery and gastric cancer  presenting with acute onset of abdominal pain with n/v, abdominal distention, and CT findings c/w SBO -Admitted to Med-Surge  -Gen Surg consulted by ER; currently no indication for surgical intervention -This may be PNA-associated ileus vs. SBO - treatment is the same either way -NPO for bowel rest but surgery is no longer for sips and chips -NG Tube removed and general surgery recommending holding replacing for now but states that if he vomits or becomes  nauseous will need to be replaced -IVF hydration as above with normal saline at a rate of 75 mL's per hour for now -Pain control with morphine 2 mg q2hprn -C/w Antiemetics with IV Zofran 4 mg q6h  -CT scan shows an SBO with transition in the mid ileal region -General surgery evaluated and have done the small bowel protocol via his NG tube but unfortunate NG tube was removed; clinically patient appears to be doing better however imaging shows that he still has a small bowel obstruction -General surgery recommending mobilization and augmented try bisacodyl 10 mg rectal suppository once and this was done yesterday and they are repeating it today -Continue to maintain electrolytes with potassium above 4 and magnesium above 2 -Diet advancement per general surgery and further care per their recommendations  Gastric adenocarcinoma/prostate CA -Gastric CA diagnosed in 10/17, s/p distal gastrectomy in 2018; he remains on active surveillance without evidence of recurrent disease. -s/p seed implants for prostate CA in 2013; continue Tamsulosin -We will need follow-up with oncology in the outpatient setting  Acute renal failure on CKD stage III, improving slowly Hyperphosphatemia, improved Metabolic acidosis, improving -Patient with acute infection (PNA) in addition to n/v with SBO - so very likely prerenal azotemia -He does have mild ketonuria -He has baseline stage 3-4 CKD but clearly markedly worse now -BUN/creatinine is improving went from 82/4.35 is now 77/3.11 -Will continue to rehydrate as above -Patient CO2 was 18, anion gap was 13; currently now CO2 is 19 and anion gap is 12 -Patient's phosphorus was 5.2 and is now 4.5 -Continue to hold his Furosemide 80 milligrams every other day for now and continue with IV fluid hydration until tolerating more p.o. intake -Avoid nephrotoxic agents, contrast dyes, hypotension if possible Repeat CMP in a.m.  HTN -Continue with IV hydralazine 5 mg every  4 PRN for systolic blood pressure greater than 160 and currently holding p.o. hydralazine -Will change Bystolic to IV Labetalol, standing q6h; continue with labetalol 5 mg every 6 scheduled -Blood Pressure was elevated at 157/103 today  OSA -Does not wear CPAP  Obesity -Estimated body mass index is 30.81 kg/m as calculated from the following:   Height as of this encounter: 6\' 2"  (1.88 m).   Weight as of this encounter: 108.9 kg. -Weight Loss and Dietary Counseling Given  GERD/peptic ulcer disease/stomach cancer -Placed on empiric PPI IV given that he is on steroids and has a history of peptic ulcer disease  Normocytic Anemia/Anemia of Chronic Kidney Disease -Patient hemoglobin/hematocrit went from 11.7/34.2 and is now 10.3/30.1 and question dilutional drop in the setting of IV fluid resuscitation -Check anemia panel in the a.m. check anemia panel showed an iron level of 108, U IBC 154, TIBC of 262, saturation ratios of 41%, ferritin of 396, folate level 30.6, vitamin B12 of 404  -Continue to monitor for signs and symptoms of bleeding; currently no overt bleeding noted -Repeat CBC in the a.m.  Hyperkalemia -Likely in the setting of renal disease -Patient's potassium is 5.9 -Given Lokelma once 10 mg and  did not know what this was elevated in the setting of hemolysis so we will repeat and repeat was 5.1 -Continue monitor and trend CMP for potassium level  DVT prophylaxis: Heparin 5,000 units sq q8h Code Status: FULL CODE  Family Communication: No family present at bedside  Disposition Plan: Maintain patient for continued management and treatment until patient tolerates p.o. and improved with his pneumonia as well as small bowel obstruction  Consultants:   General Surgery   Procedures: SBO protocol  Antimicrobials:  Anti-infectives (From admission, onward)   Start     Dose/Rate Route Frequency Ordered Stop   09/09/18 1100  cefTRIAXone (ROCEPHIN) 2 g in sodium chloride 0.9 %  100 mL IVPB     2 g 200 mL/hr over 30 Minutes Intravenous Every 24 hours 09/09/18 1051     09/09/18 1100  azithromycin (ZITHROMAX) 500 mg in sodium chloride 0.9 % 250 mL IVPB     500 mg 250 mL/hr over 60 Minutes Intravenous Every 24 hours 09/09/18 1051       Subjective: Seen and examined at bedside and he is ambulating fairly well and feels his abdomen is softer not as distended.  Patient states that he had a small bowel movement last night but nursing states that it was a very large bowel movement.  No chest pain, lightheadedness or dizziness but did get fatigued after walking 2 laps.  No other concerns or complaints at this time and surgery is advanced his diet to sips and chips.  Patient is denying any respiratory complaints at this time and saturating well without any supplemental oxygen via nasal cannula  Objective: Vitals:   09/11/18 1702 09/11/18 2349 09/12/18 0729 09/12/18 1000  BP: (!) 159/95 (!) 167/95 (!) 170/94 (!) 157/103  Pulse: 78 70 (!) 58 84  Resp:   16   Temp: 98.2 F (36.8 C) 98.9 F (37.2 C) 99.1 F (37.3 C)   TempSrc: Oral Oral Oral   SpO2: 92% 95% 98%   Weight:      Height:        Intake/Output Summary (Last 24 hours) at 09/12/2018 1451 Last data filed at 09/12/2018 2993 Gross per 24 hour  Intake -  Output 800 ml  Net -800 ml   Filed Weights   09/09/18 0737  Weight: 108.9 kg    Examination: Physical Exam:  Constitutional: Well-nourished, well-developed obese African-American male currently in no acute distress and is ambulating the halls and is slightly fatigued Eyes: Lids and conjunctive are normal.  Sclera anicteric ENMT: External ears and nose appear normal.  Grossly normal hearing. Neck: Appears supple no JVD Respiratory: Slightly diminished auscultation bilaterally no appreciable wheezing, rales, rhonchi.  Patient not tachypneic or using accessory muscles to breathe Cardiovascular: Regular rate and rhythm.  No appreciable murmurs, rubs, gallops.   Has trace extremity edema noted Abdomen: Soft, nontender, not as tender to palpate.  Distended but is improved from admission.  Bowel sounds are diminished GU: Deferred Musculoskeletal: No contractures or cyanosis.  No joint deformities in upper or lower extremities noted Skin: Skin appears warm and dry no appreciable rashes or lesions on limited skin evaluation Neurologic: Cranial nerves II through XII gross intact no appreciable focal deficits.  Romberg sign cerebellar reflexes were not assessed Psychiatric: Normal judgment and insight.  Patient is awake, alert, oriented x3.  Pleasant pleasant mood and affect  Data Reviewed: I have personally reviewed following labs and imaging studies  CBC: Recent Labs  Lab 09/09/18 0805 09/10/18 0737 09/11/18  0530 09/12/18 0714  WBC 13.6* 11.9* 12.7* 10.3  NEUTROABS 10.9* 10.9* 10.3* 7.9*  HGB 11.7* 10.2* 9.8* 10.3*  HCT 34.2* 30.0* 28.7* 30.1*  MCV 95.3 94.0 93.8 94.4  PLT 252 209 261 497   Basic Metabolic Panel: Recent Labs  Lab 09/09/18 0805 09/10/18 0737 09/11/18 0530 09/12/18 0714 09/12/18 1027  NA 134* 135 139 140 142  K 4.4 5.1 5.0 5.9* 5.1  CL 99 104 108 108 111  CO2 20* 18* 21* 19* 19*  GLUCOSE 119* 118* 106* 94 105*  BUN 82* 78* 79* 80* 77*  CREATININE 4.35* 3.39* 3.19* 3.22* 3.11*  CALCIUM 10.1 9.6 9.8 10.0 9.9  MG  --  2.3 2.6* 2.7*  --   PHOS  --  5.2* 4.2 4.5  --    GFR: Estimated Creatinine Clearance: 28.2 mL/min (A) (by C-G formula based on SCr of 3.11 mg/dL (H)). Liver Function Tests: Recent Labs  Lab 09/09/18 0805 09/10/18 0737 09/11/18 0530 09/12/18 0714  AST 16 16 13* 24  ALT 11 11 11 11   ALKPHOS 79 62 57 59  BILITOT 1.4* 0.7 0.7 1.3*  PROT 7.4 6.6 6.4* 6.3*  ALBUMIN 3.0* 2.5* 2.5* 2.7*   Recent Labs  Lab 09/09/18 0805  LIPASE 39   No results for input(s): AMMONIA in the last 168 hours. Coagulation Profile: No results for input(s): INR, PROTIME in the last 168 hours. Cardiac Enzymes: No  results for input(s): CKTOTAL, CKMB, CKMBINDEX, TROPONINI in the last 168 hours. BNP (last 3 results) No results for input(s): PROBNP in the last 8760 hours. HbA1C: No results for input(s): HGBA1C in the last 72 hours. CBG: No results for input(s): GLUCAP in the last 168 hours. Lipid Profile: No results for input(s): CHOL, HDL, LDLCALC, TRIG, CHOLHDL, LDLDIRECT in the last 72 hours. Thyroid Function Tests: No results for input(s): TSH, T4TOTAL, FREET4, T3FREE, THYROIDAB in the last 72 hours. Anemia Panel: Recent Labs    09/12/18 0714  VITAMINB12 404  FOLATE 13.6  FERRITIN 396*  TIBC 262  IRON 108  RETICCTPCT 2.2   Sepsis Labs: Recent Labs  Lab 09/09/18 1128 09/09/18 1917  PROCALCITON  --  10.85  LATICACIDVEN 1.1  --     Recent Results (from the past 240 hour(s))  SARS Coronavirus 2 [facility] order, Performed in [facility] hospital lab) Nasopharyngeal Nasopharyngeal Swab     Status: None   Collection Time: 09/09/18  8:10 AM   Specimen: Nasopharyngeal Swab  Result Value Ref Range Status   SARS Coronavirus 2 NEGATIVE NEGATIVE Final    Comment: (NOTE) If result is NEGATIVE SARS-CoV-2 target nucleic acids are NOT DETECTED. The SARS-CoV-2 RNA is generally detectable in upper and lower  respiratory specimens during the acute phase of infection. The lowest  concentration of SARS-CoV-2 viral copies this assay can detect is 250  copies / mL. A negative result does not preclude SARS-CoV-2 infection  and should not be used as the sole basis for treatment or other  patient management decisions.  A negative result may occur with  improper specimen collection / handling, submission of specimen other  than nasopharyngeal swab, presence of viral mutation(s) within the  areas targeted by this assay, and inadequate number of viral copies  (<250 copies / mL). A negative result must be combined with clinical  observations, patient history, and epidemiological information. If result is  POSITIVE SARS-CoV-2 target nucleic acids are DETECTED. The SARS-CoV-2 RNA is generally detectable in upper and lower  respiratory specimens dur  ing the acute phase of infection.  Positive  results are indicative of active infection with SARS-CoV-2.  Clinical  correlation with patient history and other diagnostic information is  necessary to determine patient infection status.  Positive results do  not rule out bacterial infection or co-infection with other viruses. If result is PRESUMPTIVE POSTIVE SARS-CoV-2 nucleic acids MAY BE PRESENT.   A presumptive positive result was obtained on the submitted specimen  and confirmed on repeat testing.  While 2019 novel coronavirus  (SARS-CoV-2) nucleic acids may be present in the submitted sample  additional confirmatory testing may be necessary for epidemiological  and / or clinical management purposes  to differentiate between  SARS-CoV-2 and other Sarbecovirus currently known to infect humans.  If clinically indicated additional testing with an alternate test  methodology [phone]) is advised. The SARS-CoV-2 RNA is generally  detectable in upper and lower respiratory sp ecimens during the acute  phase of infection. The expected result is Negative. Fact Sheet for Patients:  StrictlyIdeas.no Fact Sheet for Healthcare Providers: BankingDealers.co.za This test is not yet approved or cleared by the [place] FDA and has been authorized for detection and/or diagnosis of SARS-CoV-2 by FDA under an Emergency Use Authorization (EUA).  This EUA will remain in effect (meaning this test can be used) for the duration of the COVID-19 declaration under Section 564(b)(1) of the Act, 21 U.S.C. section 360bbb-3(b)(1), unless the authorization is terminated or revoked sooner. Performed at [facility], [place] [address] [place], [place] [address]   Urine culture     Status: Abnormal   Collection Time:  09/09/18 10:03 AM   Specimen: Urine, Clean Catch  Result Value Ref Range Status   Specimen Description URINE, CLEAN CATCH  Final   Special Requests   Final    NONE Performed at [facility], [place] [address] [place], [place] [address]    Culture 20,000 COLONIES/mL PROTEUS MIRABILIS (A)  Final   Report Status 09/11/2018 FINAL  Final   Organism ID, Bacteria PROTEUS MIRABILIS (A)  Final      Susceptibility   Proteus mirabilis - MIC*    AMPICILLIN <=2 SENSITIVE Sensitive     CEFAZOLIN <=4 SENSITIVE Sensitive     CEFTRIAXONE <=1 SENSITIVE Sensitive     CIPROFLOXACIN <=0.25 SENSITIVE Sensitive     GENTAMICIN <=1 SENSITIVE Sensitive     IMIPENEM 2 SENSITIVE Sensitive     NITROFURANTOIN 128 RESISTANT Resistant     TRIMETH/SULFA <=20 SENSITIVE Sensitive     AMPICILLIN/SULBACTAM <=2 SENSITIVE Sensitive     PIP/TAZO <=4 SENSITIVE Sensitive     * 20,000 COLONIES/mL PROTEUS MIRABILIS  Blood Culture (routine x 2)     Status: None (Preliminary result)   Collection Time: 09/09/18 11:20 AM   Specimen: BLOOD  Result Value Ref Range Status   Specimen Description BLOOD LEFT ANTECUBITAL  Final   Special Requests   Final    BOTTLES DRAWN AEROBIC AND ANAEROBIC Blood Culture adequate volume   Culture   Final    NO GROWTH 3 DAYS Performed at [facility], [address], [place], [place] [address]    Report Status PENDING  Incomplete  Blood Culture (routine x 2)     Status: None (Preliminary result)   Collection Time: 09/09/18 11:22 AM   Specimen: BLOOD LEFT HAND  Result Value Ref Range Status   Specimen Description BLOOD LEFT HAND  Final   Special Requests   Final    BOTTLES DRAWN  AEROBIC ONLY Blood Culture results may not be optimal due to an inadequate volume of blood received in culture bottles   Culture   Final    NO GROWTH 3 DAYS Performed at [facility], [place] [address] [place], [place] [address]    Report Status PENDING  Incomplete    Radiology Studies: Dg Chest  Port 1 View  Result Date: 09/12/2018 CLINICAL DATA:  Pneumonia. EXAM: PORTABLE CHEST 1 VIEW COMPARISON:  Chest x-ray dated September 10, 2018. FINDINGS: Interval removal of the enteric tube. Unchanged left chest wall port catheter. Stable cardiomediastinal silhouette. Normal pulmonary vascularity. Streaky bibasilar opacities have minimally improved. No pleural effusion or pneumothorax. No acute osseous abnormality. IMPRESSION: 1. Minimally improved streaky bibasilar opacities potentially reflecting resolving pneumonia and/or atelectasis. Electronically Signed   By: Titus Dubin M.D.   On: 09/12/2018 09:33   Dg Chest Port 1 View  Result Date: 09/10/2018 CLINICAL DATA:  73 year old male with shortness of breath and abdominal pain. EXAM: PORTABLE CHEST 1 VIEW COMPARISON:  09/09/2018 and earlier. FINDINGS: Portable AP semi upright view at 1610 hours. Stable left chest Port-A-Cath. Enteric tube has been placed and courses to the abdomen, tip not included. Continued low lung volumes. Stable cardiac size and mediastinal contours. Visualized tracheal air column is within normal limits. No pneumothorax. No pulmonary edema. No definite pleural effusion. Streaky and confluent bibasilar opacity is stable. Paucity of bowel gas in the upper abdomen. No acute osseous abnormality identified. IMPRESSION: 1. Continued low lung volumes with stable bibasilar opacity. Top differential considerations are atelectasis and infection. 2. No new cardiopulmonary abnormality. Electronically Signed   By: Genevie Ann M.D.   On: 09/10/2018 16:27   Dg Abd Portable 1v  Result Date: 09/12/2018 CLINICAL DATA:  Small bowel obstruction. EXAM: PORTABLE ABDOMEN - 1 VIEW COMPARISON:  Abdominal x-ray from yesterday. FINDINGS: Dilated fluid and air-filled small bowel loops are similar to yesterday. No radiopaque calculi or other significant abnormality. No acute osseous abnormality. IMPRESSION: 1. Unchanged small bowel obstruction. Electronically  Signed   By: Titus Dubin M.D.   On: 09/12/2018 09:30   Dg Abd Portable 1v  Result Date: 09/11/2018 CLINICAL DATA:  Follow-up small bowel obstruction EXAM: PORTABLE ABDOMEN - 1 VIEW COMPARISON:  09/10/2018 FINDINGS: Single image shows some persistent fluid and air dilatation of the small intestine consistent with persistent small-bowel obstruction. No significant change suspected based on this single image. IMPRESSION: Persistent small bowel obstruction pattern. Electronically Signed   By: Nelson Chimes M.D.   On: 09/11/2018 08:20   Dg Abd Portable 1v  Result Date: 09/11/2018 CLINICAL DATA:  Nasogastric tube placement EXAM: PORTABLE ABDOMEN - 1 VIEW COMPARISON:  09/10/2018 FINDINGS: The bowel gas pattern is normal. No radio-opaque calculi or other significant radiographic abnormality are seen. IMPRESSION: No nasogastric tube is visualized. It may be coiled in the esophagus. Electronically Signed   By: Ulyses Jarred M.D.   On: 09/11/2018 00:11   Dg Abd Portable 1v  Result Date: 09/10/2018 CLINICAL DATA:  NG tube placement EXAM: PORTABLE ABDOMEN - 1 VIEW COMPARISON:  09/10/2018 FINDINGS: NG tube is not visualized in the mid to lower chest or abdomen. Bibasilar atelectasis. Nonobstructive bowel-gas crash that mildly prominent left abdominal small bowel loops again noted. IMPRESSION: No NG tube visualized. Electronically Signed   By: Rolm Baptise M.D.   On: 09/10/2018 23:47   Dg Abd Portable 1v-small Bowel Obstruction Protocol-initial, 8 Hr Delay  Result Date: 09/10/2018 CLINICAL DATA:  Small bowel obstruction EXAM:  PORTABLE ABDOMEN - 1 VIEW COMPARISON:  09/10/2018 FINDINGS: Dilated small bowel loops in the left abdomen. Overall degree of bowel distention decreased since prior study. NG tube tip is seen in the stomach. Oral contrast material is seen in the fundus of the stomach. Moderate stool burden in the colon. IMPRESSION: Small bowel obstruction pattern, slightly improved with NG tube in the  stomach. Electronically Signed   By: Rolm Baptise M.D.   On: 09/10/2018 18:48   Scheduled Meds: . heparin  5,000 Units Subcutaneous Q8H  . labetalol  5 mg Intravenous Q6H  . methylPREDNISolone (SOLU-MEDROL) injection  40 mg Intravenous Q24H  . pantoprazole (PROTONIX) IV  40 mg Intravenous Q24H   Continuous Infusions: . sodium chloride 75 mL/hr at 09/12/18 0648  . azithromycin 500 mg (09/12/18 0926)  . cefTRIAXone (ROCEPHIN)  IV 2 g (09/12/18 0836)    LOS: 3 days   Kerney Elbe, DO [facility] PAGER is on [place]  If 7PM-7AM, please contact night-coverage [email or web address] Password [email or web address] 09/12/2018, 2:51 PM

## 2018-09-13 ENCOUNTER — Inpatient Hospital Stay (HOSPITAL_COMMUNITY): Payer: Medicare Other

## 2018-09-13 LAB — CBC WITH DIFFERENTIAL/PLATELET
Abs Immature Granulocytes: 0.55 10*3/uL — ABNORMAL HIGH (ref 0.00–0.07)
Basophils Absolute: 0 10*3/uL (ref 0.0–0.1)
Basophils Relative: 0 %
Eosinophils Absolute: 0 10*3/uL (ref 0.0–0.5)
Eosinophils Relative: 0 %
HCT: 30.4 % — ABNORMAL LOW (ref 39.0–52.0)
Hemoglobin: 10.1 g/dL — ABNORMAL LOW (ref 13.0–17.0)
Immature Granulocytes: 5 %
Lymphocytes Relative: 13 %
Lymphs Abs: 1.4 10*3/uL (ref 0.7–4.0)
MCH: 31.4 pg (ref 26.0–34.0)
MCHC: 33.2 g/dL (ref 30.0–36.0)
MCV: 94.4 fL (ref 80.0–100.0)
Monocytes Absolute: 0.7 10*3/uL (ref 0.1–1.0)
Monocytes Relative: 7 %
Neutro Abs: 7.8 10*3/uL — ABNORMAL HIGH (ref 1.7–7.7)
Neutrophils Relative %: 75 %
Platelets: 300 10*3/uL (ref 150–400)
RBC: 3.22 MIL/uL — ABNORMAL LOW (ref 4.22–5.81)
RDW: 15.5 % (ref 11.5–15.5)
WBC: 10.5 10*3/uL (ref 4.0–10.5)
nRBC: 0 % (ref 0.0–0.2)

## 2018-09-13 LAB — COMPREHENSIVE METABOLIC PANEL
ALT: 12 U/L (ref 0–44)
AST: 19 U/L (ref 15–41)
Albumin: 2.8 g/dL — ABNORMAL LOW (ref 3.5–5.0)
Alkaline Phosphatase: 46 U/L (ref 38–126)
Anion gap: 9 (ref 5–15)
BUN: 70 mg/dL — ABNORMAL HIGH (ref 8–23)
CO2: 18 mmol/L — ABNORMAL LOW (ref 22–32)
Calcium: 10 mg/dL (ref 8.9–10.3)
Chloride: 113 mmol/L — ABNORMAL HIGH (ref 98–111)
Creatinine, Ser: 2.89 mg/dL — ABNORMAL HIGH (ref 0.61–1.24)
GFR calc Af Amer: 24 mL/min — ABNORMAL LOW (ref >60)
GFR calc non Af Amer: 21 mL/min — ABNORMAL LOW (ref >60)
Glucose, Bld: 90 mg/dL (ref 70–99)
Potassium: 4.9 mmol/L (ref 3.5–5.1)
Sodium: 140 mmol/L (ref 135–145)
Total Bilirubin: 0.9 mg/dL (ref 0.3–1.2)
Total Protein: 6.6 g/dL (ref 6.5–8.1)

## 2018-09-13 LAB — PHOSPHORUS: Phosphorus: 4.5 mg/dL (ref 2.5–4.6)

## 2018-09-13 LAB — MAGNESIUM: Magnesium: 2.5 mg/dL — ABNORMAL HIGH (ref 1.7–2.4)

## 2018-09-13 IMAGING — DX ABDOMEN - 1 VIEW
2 series · 2 of 2 positions shown · non-contrast
Comparison: [DATE]

CLINICAL DATA: Small bowel obstruction.

EXAM:
ABDOMEN - 1 VIEW

[abdomen kub (1 of 2)]
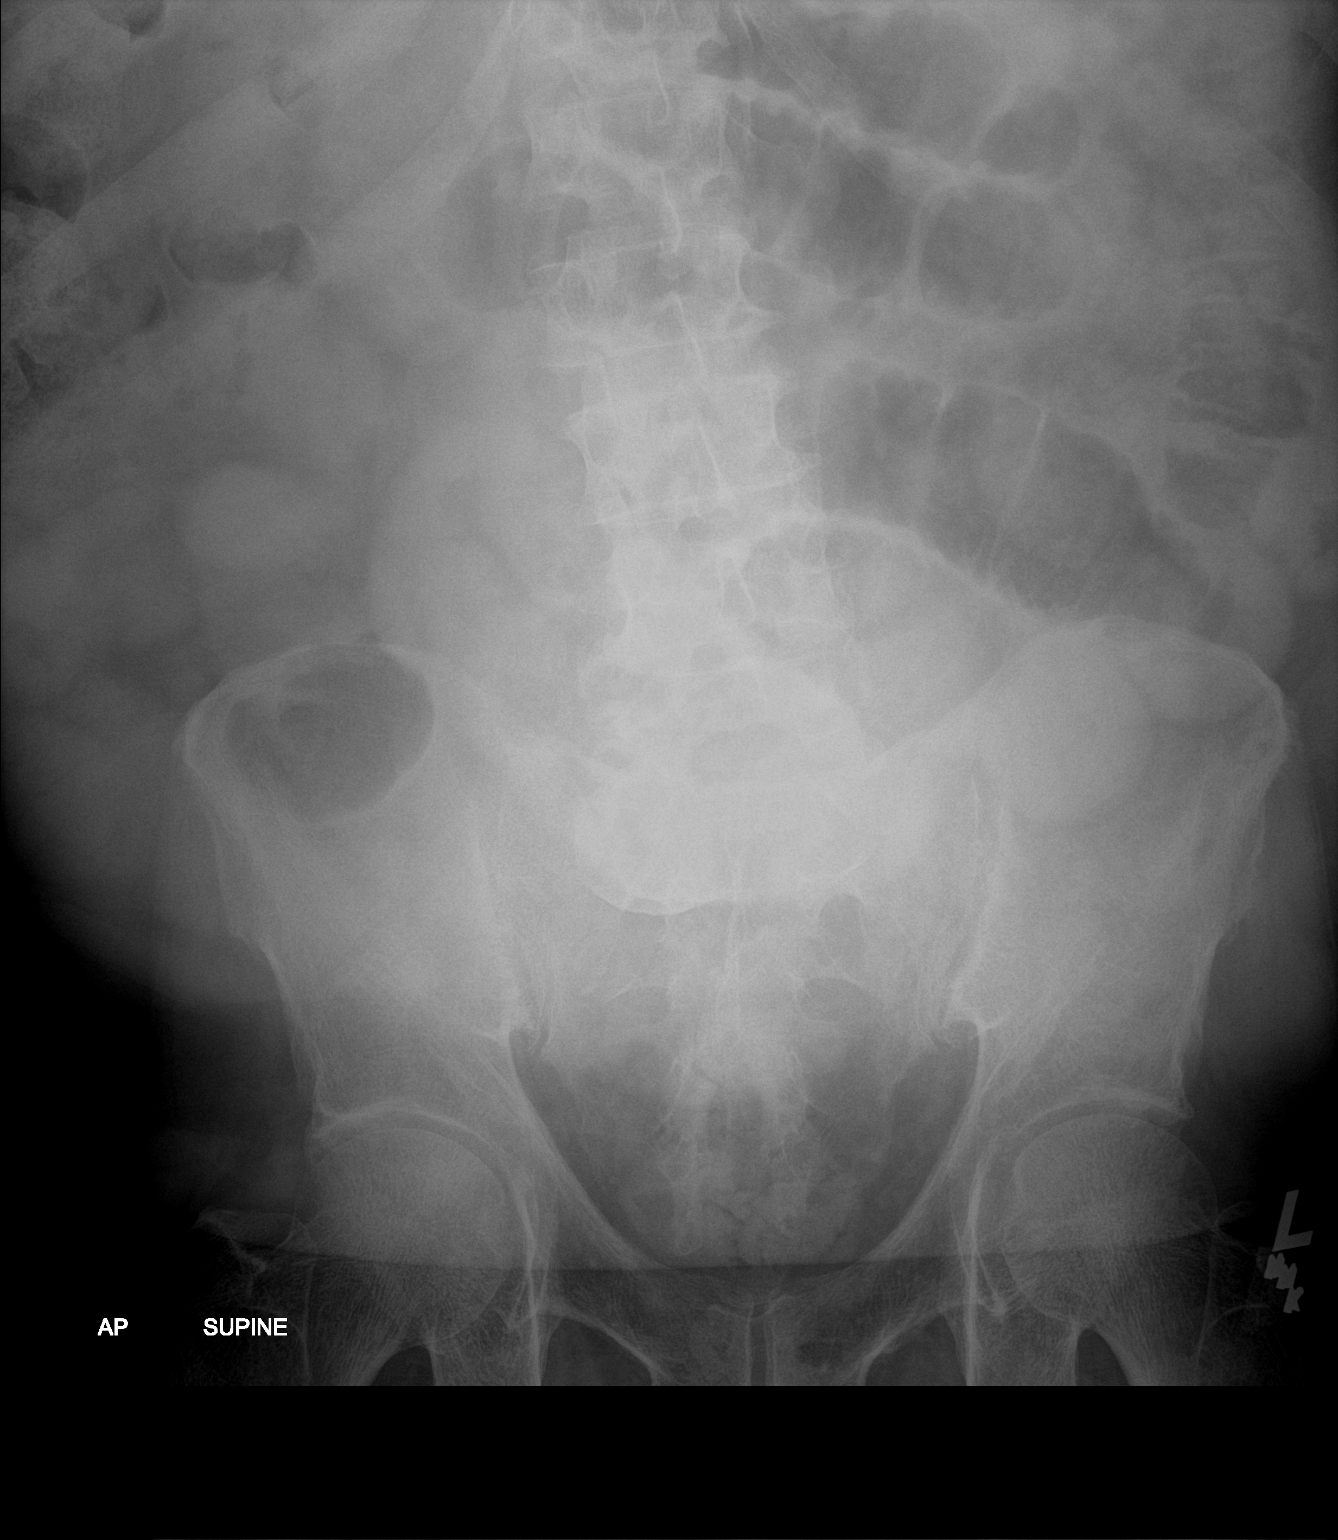

[abdomen kub (2 of 2)]
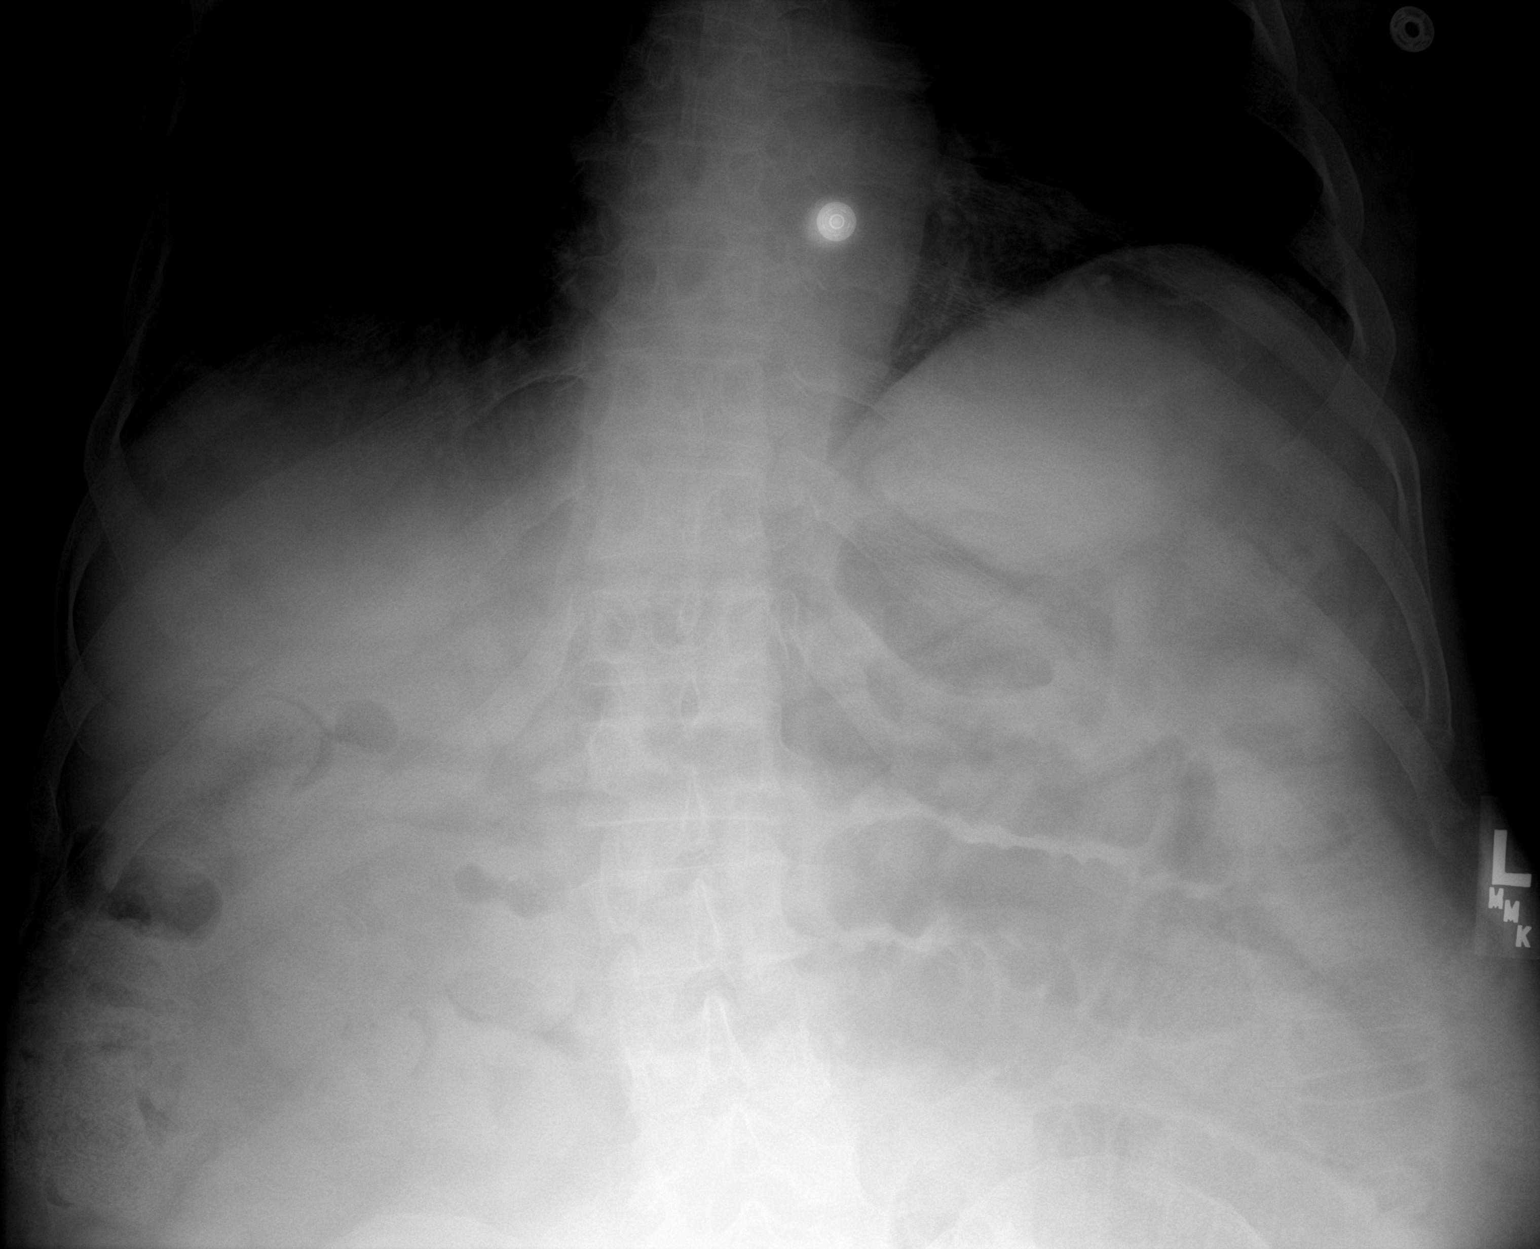

[2 of 2 positions shown; findings below may reference images not displayed]

FINDINGS: Probable slightly increased prominence of dilated small bowel loops.
There is some air and fecal material visualized in the proximal
colon. No free air identified.
IMPRESSION: Slight worsening of partial small bowel obstruction.

## 2018-09-13 IMAGING — DX PORTABLE CHEST - 1 VIEW
1 series · 1 of 1 positions shown · non-contrast
Comparison: Chest radiographs dated [DATE], [DATE]

CLINICAL DATA: ? Smbo, no chest complaints

EXAM:
PORTABLE CHEST 1 VIEW

[chest ap]
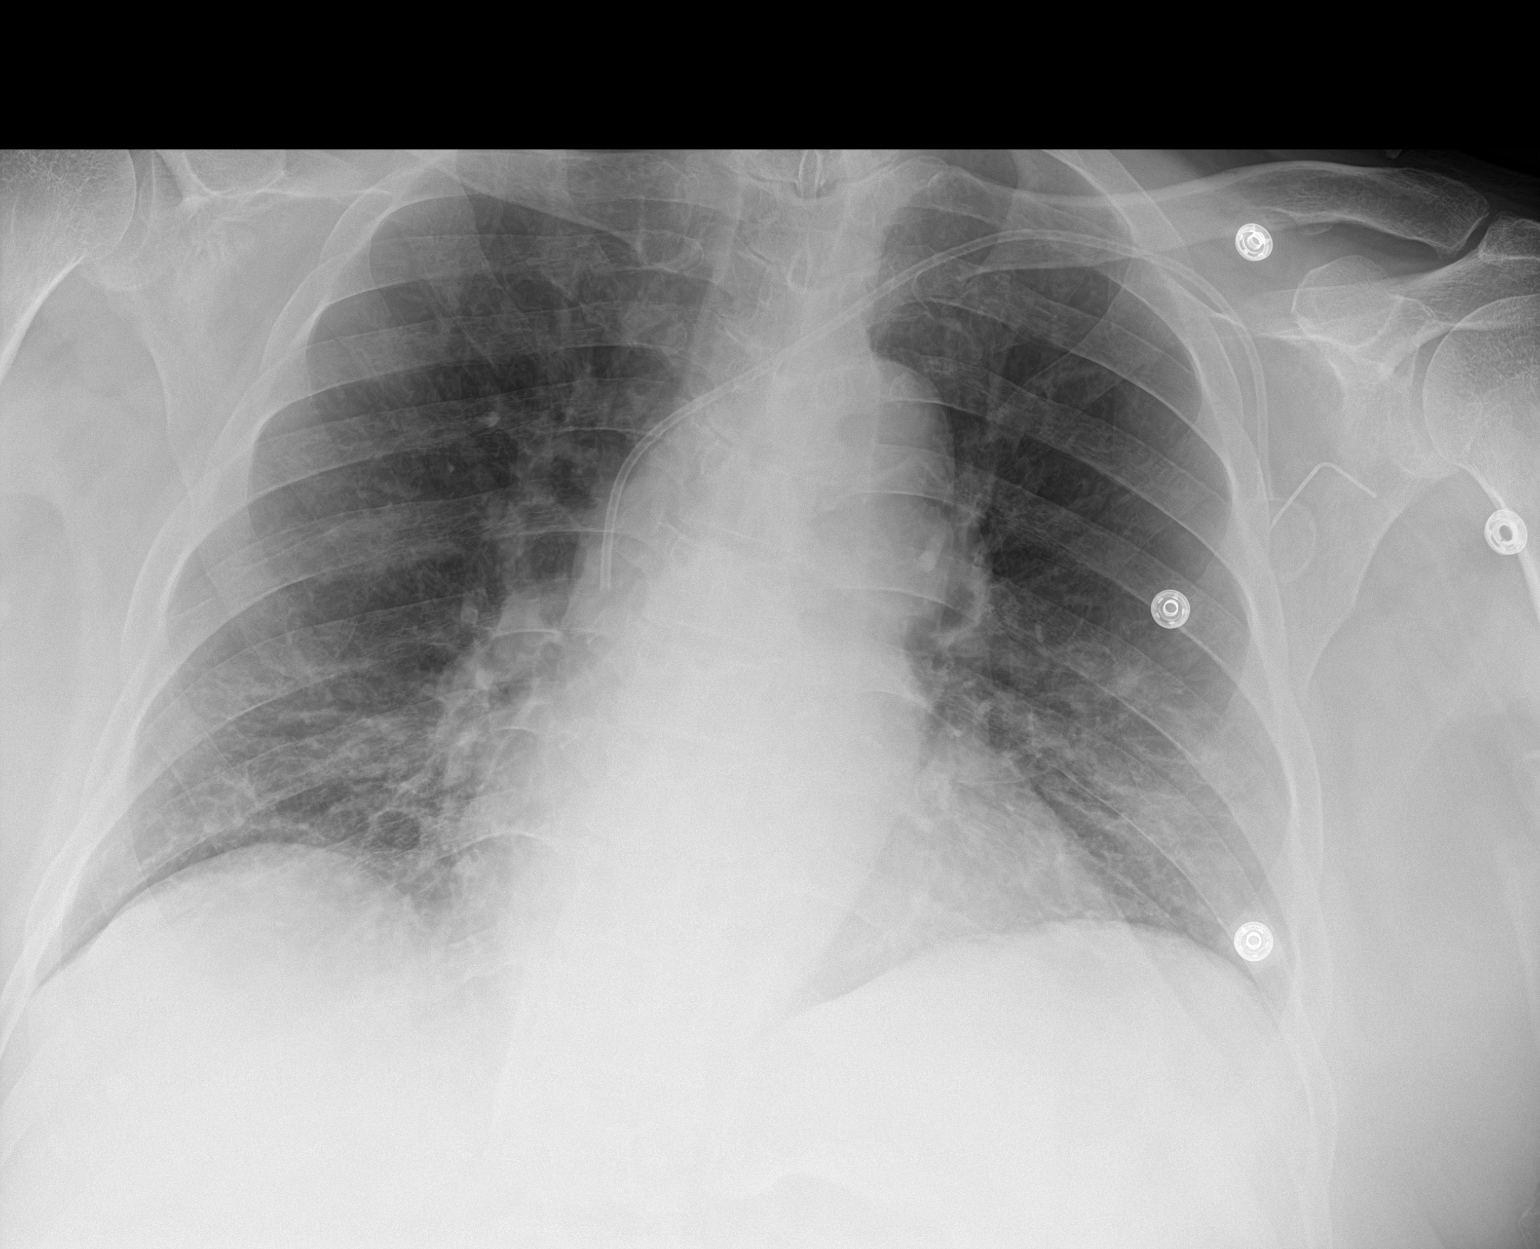

[1 of 1 positions shown; findings below may reference images not displayed]

FINDINGS: Unchanged position of a left central venous catheter. Stable
enlarged cardiomediastinal contours. Stable bibasilar streaky
pulmonary opacities. No pneumothorax or large pleural effusion.
Visualized skeleton unremarkable.
IMPRESSION: Persistent bibasilar streaky pulmonary opacities possibly
representing atelectasis or infection.

## 2018-09-13 MED ORDER — MAGNESIUM HYDROXIDE 400 MG/5ML PO SUSP
30.0000 mL | Freq: Once | ORAL | Status: AC
  Administered 2018-09-13: 30 mL via ORAL
  Filled 2018-09-13: qty 30

## 2018-09-13 MED ORDER — BISACODYL 10 MG RE SUPP
10.0000 mg | Freq: Once | RECTAL | Status: AC
  Administered 2018-09-13: 10 mg via RECTAL
  Filled 2018-09-13: qty 1

## 2018-09-13 MED ORDER — POLYETHYLENE GLYCOL 3350 17 G PO PACK
17.0000 g | PACK | Freq: Every day | ORAL | Status: DC
  Administered 2018-09-13 – 2018-09-15 (×3): 17 g via ORAL
  Filled 2018-09-13 (×3): qty 1

## 2018-09-13 NOTE — Plan of Care (Signed)
  Problem: Education: Goal: Knowledge of General Education information will improve Description Including pain rating scale, medication(s)/side effects and non-pharmacologic comfort measures Outcome: Progressing   Problem: Clinical Measurements: Goal: Respiratory complications will improve Outcome: Progressing   Problem: Activity: Goal: Risk for activity intolerance will decrease Outcome: Progressing   Problem: Coping: Goal: Level of anxiety will decrease Outcome: Progressing   Problem: Pain Managment: Goal: General experience of comfort will improve Outcome: Progressing   

## 2018-09-13 NOTE — Plan of Care (Signed)
  Problem: Education: Goal: Knowledge of General Education information will improve Description: Including pain rating scale, medication(s)/side effects and non-pharmacologic comfort measures Outcome: Progressing   Problem: Clinical Measurements: Goal: Ability to maintain clinical measurements within normal limits will improve Outcome: Progressing   

## 2018-09-13 NOTE — Progress Notes (Signed)
PROGRESS NOTE    Jacob [name]  ION:[id number] DOB: 01-04-1946 DOA: 09/09/2018 PCP: Prince Solian, MD   Brief Narrative:  HPI per Dr. Karmen Bongo on 09/09/2018 Jacob [name] is a 73 y.o. male with medical history significant of stomach cancer in remission; OSA not on CPAP; prostate CA (2013); HTN; HLD; CKD; and BPH presenting with abdominal pain.  He reports that he was constipated and felt n/v.  He has been feeling bloated and nauseated for the last 3-4 days.  He was seen by his PCP yesterday for SOB and hemoptysis and was given an antibiotic for multifocal PNA.  No fever.  No known COVID contacts.  He reports that he does wear a mask when out in public.   ED Course:  Presented with multifocal PNA (COVID negative) and SBO.  2-3 days of cough with sputum, CXR yesterday with PCP with B PNA and given abx.  Today with n/v.  Code sepsis, given small IVF bolus.  Creatinine 2-> 4.35.  CT with SBO.  Surgery called and will place NGT.  **Interim History Patient is feeling a little bit better today no shortness of breath but still complaining of some abdominal tenderness.  Underwent a small bowel protocol via his NG tube by general surgery but pulled NG out and refused to have his NG tube replaced.  General surgery evaluated and recommended continue to leave out his NG tube and recommending ambulation for mobilization as well as trying suppository today and again today.  Clinically patient is appearing to be better however film still shows an SBO.  Patient had a bowel movement and states it was small but nursing states it was a large bowel movement.  Potassium yesterday was elevated and he was given a dose of Lokelma which improved.  Patient did have a bowel movement after suppository and general surgery recommending continuing to hold his NG despite his abdominal plain films worsening.  If he has recurrence of nausea or vomiting they are recommending replacing NG tube before considering  possible surgery.  Assessment & Plan:   Principal Problem:   Pneumonia Active Problems:   Essential hypertension   Gastric adenocarcinoma ([place])   Acute renal failure superimposed on stage 3 chronic kidney disease (HCC)   SBO (small bowel obstruction) (HCC)   PNA  -Patient was seen by his PCP and diagnosed with multifocal PNA -He was given PO Cefdinir -CXR indicates multifocal PNA, but it is not clear if this is significantly worse and led to ileus or if his SBO was the driving force for hospitalization -COVID negative. -CURB-65 score is 2 - admitted the patient to Med Surg. -Pneumonia Severity Index (PSI) is Class 4, 9% mortality. -Corticosteroids have been to shown to low overall mortality rate; risk of ARDS; and need for mechanical ventilation.  This is particularly true in severe PNA (class 3+ PSI).  Will add Solumedrol 40 mg daily for now and continue. -Started Azithromycin 500 mg daily AND Rocephin due to no risk factors for MDR cause. -NS @ 75cc/hr and will continue for now.;  Patient with n.p.o. but now on a clear liquid diet per general surgery and they are recommending trying ice chips and sips of clears -Fever control with Antipyretics of Acetaminophen 650 mg po q6hprn -Repeat CBC in am -Sputum cultures pending; urine culture obtained and showed 20,000 colonies of Proteus mirabilis -Blood cultures x2 showed NGTD at 4 Days  -Strep pneumo testing was negative  -PCT was 10.85 -WBC is now 10.5 today and essentially  same as yesterday when it was 10.3 -Repeat CXR this a.m. showed "persistent bibasilar streaky pulmonary opacities possibly representing atelectasis or infection." -Will start DuoNeb PRN  -C/w IV Soulmedrol 40 mg q24h for now and likely can transition to p.o. once he is taken more and  SBO -Patient with prior h/o abdominal surgery and gastric cancer presenting with acute onset of abdominal pain with n/v, abdominal distention, and CT findings c/w SBO -Admitted to  Med-Surge  -Gen Surg consulted by ER; currently no indication for surgical intervention -This may be PNA-associated ileus vs. SBO - treatment is the same either way -NPO for bowel rest initially started but now patient recommended on sips and trials of clears -NG Tube removed and general surgery recommending holding replacing for now but states that if he vomits or becomes nauseous will need to be replaced -IVF hydration as above with normal saline at a rate of 75 mL's per hour for now -Pain control with morphine 2 mg q2hprn -C/w Antiemetics with IV Zofran 4 mg q6h  -CT scan shows an SBO with transition in the mid ileal region -General surgery evaluated and have done the small bowel protocol via his NG tube but unfortunate NG tube was removed -General surgery recommending mobilization and also recommended trying bisacodyl 10 mg rectal suppository once and this was done yesterday and they are repeating it today; now they are adding milk of magnesia today -Continue to maintain electrolytes with potassium above 4 and magnesium above 2 -Diet advancement per general surgery and further care per their recommendations: Currently on clear liquid diet -Further care per general surgery  Gastric adenocarcinoma/prostate CA -Gastric CA diagnosed in 10/17, s/p distal gastrectomy in 2018; he remains on active surveillance without evidence of recurrent disease. -s/p seed implants for prostate CA in 2013; continue Tamsulosin -We will need follow-up with oncology in the outpatient setting  Acute renal failure on CKD stage III, improving slowly Hyperphosphatemia, improved Metabolic acidosis and Hyperchloremia -Patient with acute infection (PNA) in addition to n/v with SBO - so very likely prerenal azotemia -He does have mild ketonuria -He has baseline stage 3-4 CKD but clearly markedly worse now -BUN/creatinine is improving went from 82/4.35 is now 70/2.89 -Will continue to rehydrate as above given SBO and  clear liquid diet -Patient CO2 was 18, anion gap was 13; currently now CO2 is 18 and anion gap is 9; chloride level is now 113 -Patient's phosphorus was 5.2 and is now 4.5 -Continue to hold his Furosemide 80 milligrams every other day for now and continue with IV fluid hydration until tolerating more p.o. intake -Avoid nephrotoxic agents, contrast dyes, hypotension if possible Repeat CMP in a.m.  HTN -Continue with IV hydralazine 5 mg every 4 PRN for systolic blood pressure greater than 160 and currently holding p.o. hydralazine -Will change Bystolic to IV Labetalol, standing q6h; continue with labetalol 5 mg every 6 scheduled -Blood Pressure was elevated at 164/106 today  OSA -Does not wear CPAP  Obesity -Estimated body mass index is 30.81 kg/m as calculated from the following:   Height as of this encounter: 6\' 2"  (1.88 m).   Weight as of this encounter: 108.9 kg. -Weight Loss and Dietary Counseling Given  GERD/peptic ulcer disease/stomach cancer -Placed on empiric PPI IV given that he is on steroids and has a history of peptic ulcer disease  Normocytic Anemia/Anemia of Chronic Kidney Disease -Patient hemoglobin/hematocrit went from 11.7/34.2 and is now 10.1/30.4 and question dilutional drop in the setting of IV fluid resuscitation -  Check anemia panel in the a.m. check anemia panel showed an iron level of 108, U IBC 154, TIBC of 262, saturation ratios of 41%, ferritin of 396, folate level 30.6, vitamin B12 of 404  -Continue to monitor for signs and symptoms of bleeding; currently no overt bleeding noted -Repeat CBC in the a.m.  Hyperkalemia -Likely in the setting of renal disease -Patient's potassium was 5.9 and given a dose of Lokelma repeat yesterday was 5.1; currently today was 4.9 -Continue monitor and trend CMP for potassium level  DVT prophylaxis: Heparin 5,000 units sq q8h Code Status: FULL CODE  Family Communication: No family present at bedside  Disposition Plan:  Maintain patient for continued management and treatment until patient tolerates p.o. and improved with his pneumonia as well as small bowel obstruction  Consultants:   General Surgery   Procedures: SBO protocol  Antimicrobials:  Anti-infectives (From admission, onward)   Start     Dose/Rate Route Frequency Ordered Stop   09/09/18 1100  cefTRIAXone (ROCEPHIN) 2 g in sodium chloride 0.9 % 100 mL IVPB     2 g 200 mL/hr over 30 Minutes Intravenous Every 24 hours 09/09/18 1051     09/09/18 1100  azithromycin (ZITHROMAX) 500 mg in sodium chloride 0.9 % 250 mL IVPB     500 mg 250 mL/hr over 60 Minutes Intravenous Every 24 hours 09/09/18 1051       Subjective: Seen and examined at bedside and he was sitting in the chair bedside and did complain of some abdominal pain slightly.  No nausea or vomiting.  Denies any lightheadedness or dizziness and states that he had a small bowel movement after suppositories.  Has been ambulating.  No other concerns or complaints at this time.  Objective: Vitals:   09/12/18 1600 09/13/18 0009 09/13/18 0855 09/13/18 1329  BP: (!) 165/103 (!) 161/93 (!) 184/100 (!) 164/106  Pulse: 79 75 72 83  Resp:  16 18 16   Temp: 97.7 F (36.5 C) 98.3 F (36.8 C) 97.9 F (36.6 C) (!) 97.5 F (36.4 C)  TempSrc: Oral Oral Oral Oral  SpO2: 95% 92% 96% 96%  Weight:      Height:        Intake/Output Summary (Last 24 hours) at 09/13/2018 1444 Last data filed at 09/13/2018 0900 Gross per 24 hour  Intake 120 ml  Output 350 ml  Net -230 ml   Filed Weights   09/09/18 0737  Weight: 108.9 kg    Examination: Physical Exam:  Constitutional: Well-nourished, well-developed obese African-American male currently no acute distress and sitting up in the chair at bedside.  Eyes: Lids and conjunctive are normal.  Sclera is anicteric ENMT: External ears and nose appear normal.  Grossly normal hearing Neck: Appears supple no JVD Respiratory: Slightly diminished auscultation  bilaterally no appreciable wheezing, rales, rhonchi.  Patient not tachypneic or using any accessory muscles to breathe Cardiovascular: Regular rate and rhythm.  No appreciable murmurs, rubs, gallops.  Has no real appreciable lower extremity edema noted Abdomen: Soft, nontender, slightly tender to palpate today.  Distended a little bit again today.  Bowel sounds are present GU: Deferred Musculoskeletal: No contractures or cyanosis.  No joint deformities in the upper lower extremities noted Skin: Skin is warm and dry no appreciable rashes or lesions on to skin evaluation Neurologic: Cranial nerves II through XII gross intact no appreciable focal deficits.  Romberg sign cerebellar reflexes were not assessed Psychiatric: Normal judgment and insight.  Patient is awake, alert, oriented x3.  Slightly anxious but has a pleasant mood and affect  Data Reviewed: I have personally reviewed following labs and imaging studies  CBC: Recent Labs  Lab 09/09/18 0805 09/10/18 0737 09/11/18 0530 09/12/18 0714 09/13/18 0641  WBC 13.6* 11.9* 12.7* 10.3 10.5  NEUTROABS 10.9* 10.9* 10.3* 7.9* 7.8*  HGB 11.7* 10.2* 9.8* 10.3* 10.1*  HCT 34.2* 30.0* 28.7* 30.1* 30.4*  MCV 95.3 94.0 93.8 94.4 94.4  PLT 252 209 261 283 366   Basic Metabolic Panel: Recent Labs  Lab 09/10/18 0737 09/11/18 0530 09/12/18 0714 09/12/18 1027 09/13/18 0641  NA 135 139 140 142 140  K 5.1 5.0 5.9* 5.1 4.9  CL 104 108 108 111 113*  CO2 18* 21* 19* 19* 18*  GLUCOSE 118* 106* 94 105* 90  BUN 78* 79* 80* 77* 70*  CREATININE 3.39* 3.19* 3.22* 3.11* 2.89*  CALCIUM 9.6 9.8 10.0 9.9 10.0  MG 2.3 2.6* 2.7*  --  2.5*  PHOS 5.2* 4.2 4.5  --  4.5   GFR: Estimated Creatinine Clearance: 30.4 mL/min (A) (by C-G formula based on SCr of 2.89 mg/dL (H)). Liver Function Tests: Recent Labs  Lab 09/09/18 0805 09/10/18 0737 09/11/18 0530 09/12/18 0714 09/13/18 0641  AST 16 16 13* 24 19  ALT 11 11 11 11 12   ALKPHOS 79 62 57 59 46   BILITOT 1.4* 0.7 0.7 1.3* 0.9  PROT 7.4 6.6 6.4* 6.3* 6.6  ALBUMIN 3.0* 2.5* 2.5* 2.7* 2.8*   Recent Labs  Lab 09/09/18 0805  LIPASE 39   No results for input(s): AMMONIA in the last 168 hours. Coagulation Profile: No results for input(s): INR, PROTIME in the last 168 hours. Cardiac Enzymes: No results for input(s): CKTOTAL, CKMB, CKMBINDEX, TROPONINI in the last 168 hours. BNP (last 3 results) No results for input(s): PROBNP in the last 8760 hours. HbA1C: No results for input(s): HGBA1C in the last 72 hours. CBG: No results for input(s): GLUCAP in the last 168 hours. Lipid Profile: No results for input(s): CHOL, HDL, LDLCALC, TRIG, CHOLHDL, LDLDIRECT in the last 72 hours. Thyroid Function Tests: No results for input(s): TSH, T4TOTAL, FREET4, T3FREE, THYROIDAB in the last 72 hours. Anemia Panel: Recent Labs    09/12/18 0714  VITAMINB12 404  FOLATE 13.6  FERRITIN 396*  TIBC 262  IRON 108  RETICCTPCT 2.2   Sepsis Labs: Recent Labs  Lab 09/09/18 1128 09/09/18 1917  PROCALCITON  --  10.85  LATICACIDVEN 1.1  --     Recent Results (from the past 240 hour(s))  SARS Coronavirus 2 [facility] order, Performed in [facility] hospital lab) Nasopharyngeal Nasopharyngeal Swab     Status: None   Collection Time: 09/09/18  8:10 AM   Specimen: Nasopharyngeal Swab  Result Value Ref Range Status   SARS Coronavirus 2 NEGATIVE NEGATIVE Final    Comment: (NOTE) If result is NEGATIVE SARS-CoV-2 target nucleic acids are NOT DETECTED. The SARS-CoV-2 RNA is generally detectable in upper and lower  respiratory specimens during the acute phase of infection. The lowest  concentration of SARS-CoV-2 viral copies this assay can detect is 250  copies / mL. A negative result does not preclude SARS-CoV-2 infection  and should not be used as the sole basis for treatment or other  patient management decisions.  A negative result may occur with  improper specimen collection / handling,  submission of specimen other  than nasopharyngeal swab, presence of viral mutation(s) within the  areas targeted by this assay, and inadequate number of viral copies  (<  250 copies / mL). A negative result must be combined with clinical  observations, patient history, and epidemiological information. If result is POSITIVE SARS-CoV-2 target nucleic acids are DETECTED. The SARS-CoV-2 RNA is generally detectable in upper and lower  respiratory specimens dur ing the acute phase of infection.  Positive  results are indicative of active infection with SARS-CoV-2.  Clinical  correlation with patient history and other diagnostic information is  necessary to determine patient infection status.  Positive results do  not rule out bacterial infection or co-infection with other viruses. If result is PRESUMPTIVE POSTIVE SARS-CoV-2 nucleic acids MAY BE PRESENT.   A presumptive positive result was obtained on the submitted specimen  and confirmed on repeat testing.  While 2019 novel coronavirus  (SARS-CoV-2) nucleic acids may be present in the submitted sample  additional confirmatory testing may be necessary for epidemiological  and / or clinical management purposes  to differentiate between  SARS-CoV-2 and other Sarbecovirus currently known to infect humans.  If clinically indicated additional testing with an alternate test  methodology [phone]) is advised. The SARS-CoV-2 RNA is generally  detectable in upper and lower respiratory sp ecimens during the acute  phase of infection. The expected result is Negative. Fact Sheet for Patients:  StrictlyIdeas.no Fact Sheet for Healthcare Providers: BankingDealers.co.za This test is not yet approved or cleared by the [place] FDA and has been authorized for detection and/or diagnosis of SARS-CoV-2 by FDA under an Emergency Use Authorization (EUA).  This EUA will remain in effect (meaning this test can be  used) for the duration of the COVID-19 declaration under Section 564(b)(1) of the Act, 21 U.S.C. section 360bbb-3(b)(1), unless the authorization is terminated or revoked sooner. Performed at [facility], [place] [address] [place], [place] [address]   Urine culture     Status: Abnormal   Collection Time: 09/09/18 10:03 AM   Specimen: Urine, Clean Catch  Result Value Ref Range Status   Specimen Description URINE, CLEAN CATCH  Final   Special Requests   Final    NONE Performed at [facility], [place] [address], [place], [place] [address]    Culture 20,000 COLONIES/mL PROTEUS MIRABILIS (A)  Final   Report Status 09/11/2018 FINAL  Final   Organism ID, Bacteria PROTEUS MIRABILIS (A)  Final      Susceptibility   Proteus mirabilis - MIC*    AMPICILLIN <=2 SENSITIVE Sensitive     CEFAZOLIN <=4 SENSITIVE Sensitive     CEFTRIAXONE <=1 SENSITIVE Sensitive     CIPROFLOXACIN <=0.25 SENSITIVE Sensitive     GENTAMICIN <=1 SENSITIVE Sensitive     IMIPENEM 2 SENSITIVE Sensitive     NITROFURANTOIN 128 RESISTANT Resistant     TRIMETH/SULFA <=20 SENSITIVE Sensitive     AMPICILLIN/SULBACTAM <=2 SENSITIVE Sensitive     PIP/TAZO <=4 SENSITIVE Sensitive     * 20,000 COLONIES/mL PROTEUS MIRABILIS  Blood Culture (routine x 2)     Status: None (Preliminary result)   Collection Time: 09/09/18 11:20 AM   Specimen: BLOOD  Result Value Ref Range Status   Specimen Description BLOOD LEFT ANTECUBITAL  Final   Special Requests   Final    BOTTLES DRAWN AEROBIC AND ANAEROBIC Blood Culture adequate volume   Culture   Final    NO GROWTH 4 DAYS Performed at [facility], [address] [place],  [address]    Report Status PENDING  Incomplete  Blood Culture (routine x 2)     Status:  None (Preliminary result)   Collection Time: 09/09/18 11:22 AM   Specimen: BLOOD LEFT HAND  Result Value Ref Range Status   Specimen Description BLOOD LEFT HAND  Final   Special Requests   Final    BOTTLES  DRAWN AEROBIC ONLY Blood Culture results may not be optimal due to an inadequate volume of blood received in culture bottles   Culture   Final    NO GROWTH 4 DAYS Performed at [facility], [place] [address], [place], [place] [address]    Report Status PENDING  Incomplete    Radiology Studies: Dg Abd 1 View  Result Date: 09/13/2018 CLINICAL DATA:  Small bowel obstruction. EXAM: ABDOMEN - 1 VIEW COMPARISON:  09/12/2018 FINDINGS: Probable slightly increased prominence of dilated small bowel loops. There is some air and fecal material visualized in the proximal colon. No free air identified. IMPRESSION: Slight worsening of partial small bowel obstruction. Electronically Signed   By: Aletta Edouard M.D.   On: 09/13/2018 08:06   Dg Chest Port 1 View  Result Date: 09/13/2018 CLINICAL DATA:  ? Smbo, no chest complaints EXAM: PORTABLE CHEST 1 VIEW COMPARISON:  Chest radiographs dated 09/12/2018, 09/10/2018 FINDINGS: Unchanged position of a left central venous catheter. Stable enlarged cardiomediastinal contours. Stable bibasilar streaky pulmonary opacities. No pneumothorax or large pleural effusion. Visualized skeleton unremarkable. IMPRESSION: Persistent bibasilar streaky pulmonary opacities possibly representing atelectasis or infection. Electronically Signed   By: Audie Pinto M.D.   On: 09/13/2018 08:07   Dg Chest Port 1 View  Result Date: 09/12/2018 CLINICAL DATA:  Pneumonia. EXAM: PORTABLE CHEST 1 VIEW COMPARISON:  Chest x-ray dated September 10, 2018. FINDINGS: Interval removal of the enteric tube. Unchanged left chest wall port catheter. Stable cardiomediastinal silhouette. Normal pulmonary vascularity. Streaky bibasilar opacities have minimally improved. No pleural effusion or pneumothorax. No acute osseous abnormality. IMPRESSION: 1. Minimally improved streaky bibasilar opacities potentially reflecting resolving pneumonia and/or atelectasis. Electronically Signed   By: Titus Dubin M.D.    On: 09/12/2018 09:33   Dg Abd Portable 1v  Result Date: 09/12/2018 CLINICAL DATA:  Small bowel obstruction. EXAM: PORTABLE ABDOMEN - 1 VIEW COMPARISON:  Abdominal x-ray from yesterday. FINDINGS: Dilated fluid and air-filled small bowel loops are similar to yesterday. No radiopaque calculi or other significant abnormality. No acute osseous abnormality. IMPRESSION: 1. Unchanged small bowel obstruction. Electronically Signed   By: Titus Dubin M.D.   On: 09/12/2018 09:30   Scheduled Meds: . heparin  5,000 Units Subcutaneous Q8H  . labetalol  5 mg Intravenous Q6H  . methylPREDNISolone (SOLU-MEDROL) injection  40 mg Intravenous Q24H  . pantoprazole (PROTONIX) IV  40 mg Intravenous Q24H  . polyethylene glycol  17 g Oral Daily   Continuous Infusions: . sodium chloride 75 mL/hr at 09/13/18 1137  . azithromycin 500 mg (09/13/18 1119)  . cefTRIAXone (ROCEPHIN)  IV 2 g (09/13/18 1001)    LOS: 4 days   Kerney Elbe, DO [facility] PAGER is on [place]  If 7PM-7AM, please contact night-coverage [email or web address] Password [facility] 09/13/2018, 2:44 PM

## 2018-09-13 NOTE — Progress Notes (Signed)
[facility] Progress Note     Subjective: CC: sbo Patient with a little more pain just rightward from incision this AM. He did have 2 more good BMs after second suppository yesterday. Denies nausea or emesis. Really would like to avoid surgery if possible.  Objective: Vital signs in last 24 hours: Temp:  [97.7 F (36.5 C)-98.3 F (36.8 C)] 98.3 F (36.8 C) (08/31 0009) Pulse Rate:  [75-84] 75 (08/31 0009) Resp:  [16] 16 (08/31 0009) BP: (157-165)/(93-103) 161/93 (08/31 0009) SpO2:  [92 %-95 %] 92 % (08/31 0009) Last BM Date: 09/12/18  Intake/Output from previous day: 08/30 0701 - 08/31 0700 In: -  Out: 350 [Urine:350] Intake/Output this shift: No intake/output data recorded.  PE: Gen: Alert, NAD, pleasant Card: Regular rate and rhythm Pulm: Normal effort, nasal cannula present Abd: Soft, non-tender,moderatelydistended,hypoactive BS Skin: warm and dry, no rashes  Psych: A&Ox3   Lab Results:  Recent Labs    09/12/18 0714 09/13/18 0641  WBC 10.3 10.5  HGB 10.3* 10.1*  HCT 30.1* 30.4*  PLT 283 300   BMET Recent Labs    09/12/18 1027 09/13/18 0641  NA 142 140  K 5.1 4.9  CL 111 113*  CO2 19* 18*  GLUCOSE 105* 90  BUN 77* 70*  CREATININE 3.11* 2.89*  CALCIUM 9.9 10.0   PT/INR No results for input(s): LABPROT, INR in the last 72 hours. CMP     Component Value Date/Time   NA 140 09/13/2018 0641   NA 141 01/16/2017 0924   K 4.9 09/13/2018 0641   K 4.8 01/16/2017 0924   CL 113 (H) 09/13/2018 0641   CO2 18 (L) 09/13/2018 0641   CO2 25 01/16/2017 0924   GLUCOSE 90 09/13/2018 0641   GLUCOSE 80 01/16/2017 0924   BUN 70 (H) 09/13/2018 0641   BUN 24.0 01/16/2017 0924   CREATININE 2.89 (H) 09/13/2018 0641   CREATININE 2.46 (H) 04/15/2018 1158   CREATININE 1.9 (H) 01/16/2017 0924   CALCIUM 10.0 09/13/2018 0641   CALCIUM 9.8 01/16/2017 0924   PROT 6.6 09/13/2018 0641   PROT 7.1 01/16/2017 0924   ALBUMIN 2.8 (L) 09/13/2018 0641   ALBUMIN 3.2 (L) 01/16/2017 0924   AST 19 09/13/2018 0641   AST 18 04/15/2018 1158   AST 16 01/16/2017 0924   ALT 12 09/13/2018 0641   ALT 13 04/15/2018 1158   ALT 10 01/16/2017 0924   ALKPHOS 46 09/13/2018 0641   ALKPHOS 54 01/16/2017 0924   BILITOT 0.9 09/13/2018 0641   BILITOT 0.4 04/15/2018 1158   BILITOT 0.54 01/16/2017 0924   GFRNONAA 21 (L) 09/13/2018 0641   GFRNONAA 25 (L) 04/15/2018 1158   GFRAA 24 (L) 09/13/2018 0641   GFRAA 29 (L) 04/15/2018 1158   Lipase     Component Value Date/Time   LIPASE 39 09/09/2018 0805       Studies/Results: Dg Abd 1 View  Result Date: 09/13/2018 CLINICAL DATA:  Small bowel obstruction. EXAM: ABDOMEN - 1 VIEW COMPARISON:  09/12/2018 FINDINGS: Probable slightly increased prominence of dilated small bowel loops. There is some air and fecal material visualized in the proximal colon. No free air identified. IMPRESSION: Slight worsening of partial small bowel obstruction. Electronically Signed   By: Aletta Edouard M.D.   On: 09/13/2018 08:06   Dg Chest Port 1 View  Result Date: 09/13/2018 CLINICAL DATA:  ? Smbo, no chest complaints EXAM: PORTABLE CHEST 1 VIEW COMPARISON:  Chest radiographs dated 09/12/2018, 09/10/2018 FINDINGS: Unchanged position of  a left central venous catheter. Stable enlarged cardiomediastinal contours. Stable bibasilar streaky pulmonary opacities. No pneumothorax or large pleural effusion. Visualized skeleton unremarkable. IMPRESSION: Persistent bibasilar streaky pulmonary opacities possibly representing atelectasis or infection. Electronically Signed   By: Audie Pinto M.D.   On: 09/13/2018 08:07   Dg Chest Port 1 View  Result Date: 09/12/2018 CLINICAL DATA:  Pneumonia. EXAM: PORTABLE CHEST 1 VIEW COMPARISON:  Chest x-ray dated September 10, 2018. FINDINGS: Interval removal of the enteric tube. Unchanged left chest wall port catheter. Stable cardiomediastinal silhouette. Normal pulmonary vascularity. Streaky bibasilar  opacities have minimally improved. No pleural effusion or pneumothorax. No acute osseous abnormality. IMPRESSION: 1. Minimally improved streaky bibasilar opacities potentially reflecting resolving pneumonia and/or atelectasis. Electronically Signed   By: Titus Dubin M.D.   On: 09/12/2018 09:33   Dg Abd Portable 1v  Result Date: 09/12/2018 CLINICAL DATA:  Small bowel obstruction. EXAM: PORTABLE ABDOMEN - 1 VIEW COMPARISON:  Abdominal x-ray from yesterday. FINDINGS: Dilated fluid and air-filled small bowel loops are similar to yesterday. No radiopaque calculi or other significant abnormality. No acute osseous abnormality. IMPRESSION: 1. Unchanged small bowel obstruction. Electronically Signed   By: Titus Dubin M.D.   On: 09/12/2018 09:30    Anti-infectives: Anti-infectives (From admission, onward)   Start     Dose/Rate Route Frequency Ordered Stop   09/09/18 1100  cefTRIAXone (ROCEPHIN) 2 g in sodium chloride 0.9 % 100 mL IVPB     2 g 200 mL/hr over 30 Minutes Intravenous Every 24 hours 09/09/18 1051     09/09/18 1100  azithromycin (ZITHROMAX) 500 mg in sodium chloride 0.9 % 250 mL IVPB     500 mg 250 mL/hr over 60 Minutes Intravenous Every 24 hours 09/09/18 1051         Assessment/Plan Hx of gastric cancer - s/p distal gastrectomy in 2018 Dr. Barry Dienes Hx of prostate cancer - s/p radiation AKI on CKD -improving, IVF Constipation BPH GERD HTN ?COPD - 2ppd smoking hx  Multifocal pneumonia- seen on CXR and CT, COVID negative, perprimaryservice  SBO vs ileus - CT with sbo with transition in mid ileal region - hx of prior abdominal surgery so could be sbo vs reactive ileus from PNA - can leave NGT out, but if patient becomes nauseated or vomits would replace NGT -film this AM with dilated loops and psbo but clinically patient having some bowel function and no peritonitis - try ice chips and sips of clears - repeat suppository today, add miralax and milk of mag - if patient  not improved or worsened tomorrow, may benefit from operative exploration but will discuss further with MD -MOBILIZE!!!!  FEN: CLD, IVF  VTE: SCDs,SQ heparin ID: azithromycin/rocephin  LOS: 4 days    Brigid Re , [facility] 09/13/2018, 8:17 AM Pager: [phone] Consults: [phone] 7:00 AM - 4:30 PM M, W-F 7:00 AM - 11:30 AM Tues, Sat, Sun

## 2018-09-13 NOTE — Care Management Important Message (Signed)
Important Message  Patient Details  Name: Jacob [name] MRN: [id number] Date of Birth: [birth year]   Medicare Important Message Given:  Yes     Dylan Ruotolo [address] 09/13/2018, 1:34 PM

## 2018-09-14 ENCOUNTER — Telehealth: Payer: Self-pay | Admitting: *Deleted

## 2018-09-14 ENCOUNTER — Inpatient Hospital Stay (HOSPITAL_COMMUNITY): Payer: Medicare Other

## 2018-09-14 ENCOUNTER — Ambulatory Visit: Payer: Medicare Other | Admitting: Family Medicine

## 2018-09-14 DIAGNOSIS — Z0289 Encounter for other administrative examinations: Secondary | ICD-10-CM

## 2018-09-14 LAB — CBC WITH DIFFERENTIAL/PLATELET
Abs Immature Granulocytes: 0.23 10*3/uL — ABNORMAL HIGH (ref 0.00–0.07)
Basophils Absolute: 0 10*3/uL (ref 0.0–0.1)
Basophils Relative: 0 %
Eosinophils Absolute: 0 10*3/uL (ref 0.0–0.5)
Eosinophils Relative: 0 %
HCT: 30.5 % — ABNORMAL LOW (ref 39.0–52.0)
Hemoglobin: 10.1 g/dL — ABNORMAL LOW (ref 13.0–17.0)
Immature Granulocytes: 2 %
Lymphocytes Relative: 10 %
Lymphs Abs: 1 10*3/uL (ref 0.7–4.0)
MCH: 31.9 pg (ref 26.0–34.0)
MCHC: 33.1 g/dL (ref 30.0–36.0)
MCV: 96.2 fL (ref 80.0–100.0)
Monocytes Absolute: 0.4 10*3/uL (ref 0.1–1.0)
Monocytes Relative: 4 %
Neutro Abs: 8.6 10*3/uL — ABNORMAL HIGH (ref 1.7–7.7)
Neutrophils Relative %: 84 %
Platelets: 291 10*3/uL (ref 150–400)
RBC: 3.17 MIL/uL — ABNORMAL LOW (ref 4.22–5.81)
RDW: 15.4 % (ref 11.5–15.5)
WBC: 10.4 10*3/uL (ref 4.0–10.5)
nRBC: 0 % (ref 0.0–0.2)

## 2018-09-14 LAB — COMPREHENSIVE METABOLIC PANEL
ALT: 12 U/L (ref 0–44)
AST: 19 U/L (ref 15–41)
Albumin: 2.9 g/dL — ABNORMAL LOW (ref 3.5–5.0)
Alkaline Phosphatase: 47 U/L (ref 38–126)
Anion gap: 8 (ref 5–15)
BUN: 55 mg/dL — ABNORMAL HIGH (ref 8–23)
CO2: 20 mmol/L — ABNORMAL LOW (ref 22–32)
Calcium: 9.9 mg/dL (ref 8.9–10.3)
Chloride: 111 mmol/L (ref 98–111)
Creatinine, Ser: 2.53 mg/dL — ABNORMAL HIGH (ref 0.61–1.24)
GFR calc Af Amer: 28 mL/min — ABNORMAL LOW (ref >60)
GFR calc non Af Amer: 24 mL/min — ABNORMAL LOW (ref >60)
Glucose, Bld: 115 mg/dL — ABNORMAL HIGH (ref 70–99)
Potassium: 5.1 mmol/L (ref 3.5–5.1)
Sodium: 139 mmol/L (ref 135–145)
Total Bilirubin: 0.8 mg/dL (ref 0.3–1.2)
Total Protein: 6.7 g/dL (ref 6.5–8.1)

## 2018-09-14 LAB — PHOSPHORUS: Phosphorus: 4.2 mg/dL (ref 2.5–4.6)

## 2018-09-14 LAB — CULTURE, BLOOD (ROUTINE X 2)
Culture: NO GROWTH
Culture: NO GROWTH
Special Requests: ADEQUATE

## 2018-09-14 LAB — MAGNESIUM: Magnesium: 2.4 mg/dL (ref 1.7–2.4)

## 2018-09-14 IMAGING — DX DG CHEST 1V PORT
1 series · 2 of 2 positions shown · non-contrast
Comparison: [DATE]

CLINICAL DATA: Shortness of breath

EXAM:
PORTABLE CHEST 1 VIEW

[Series 1: chest ap · 0.14mm/px · 2 of 2 slices shown]
[im 1/2]
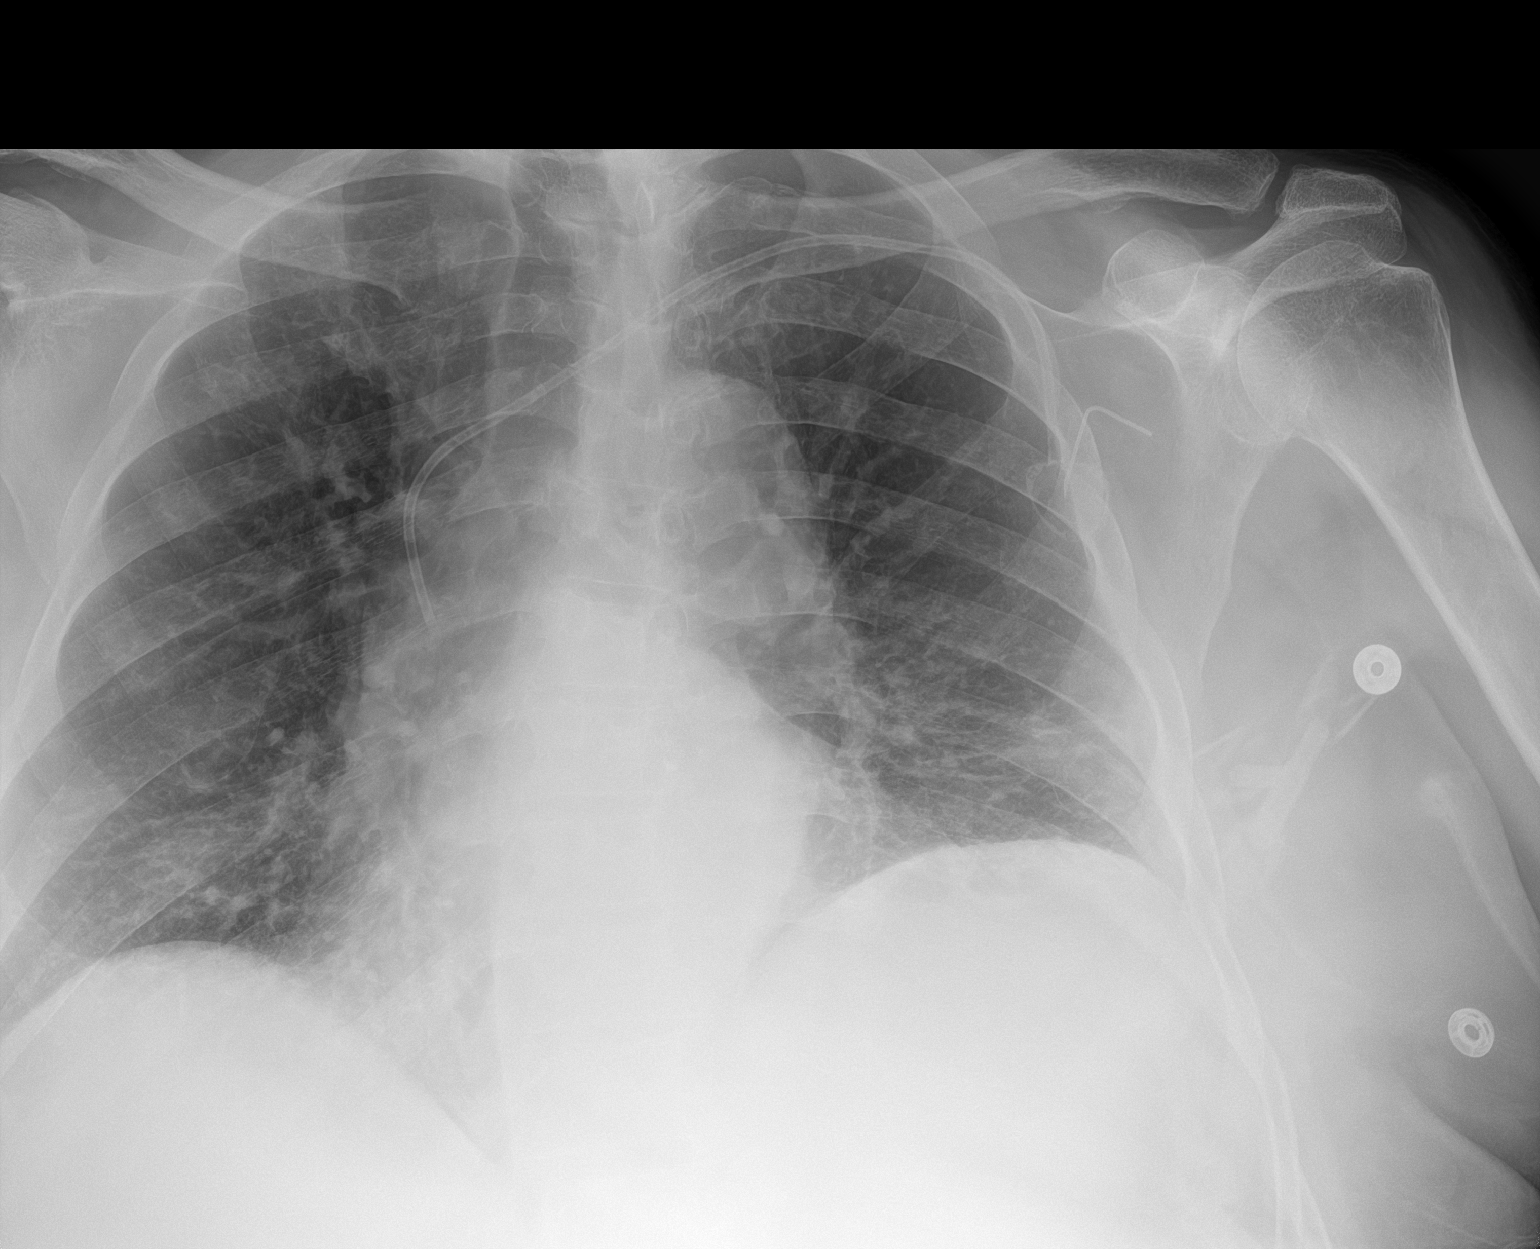
[im 2/2]
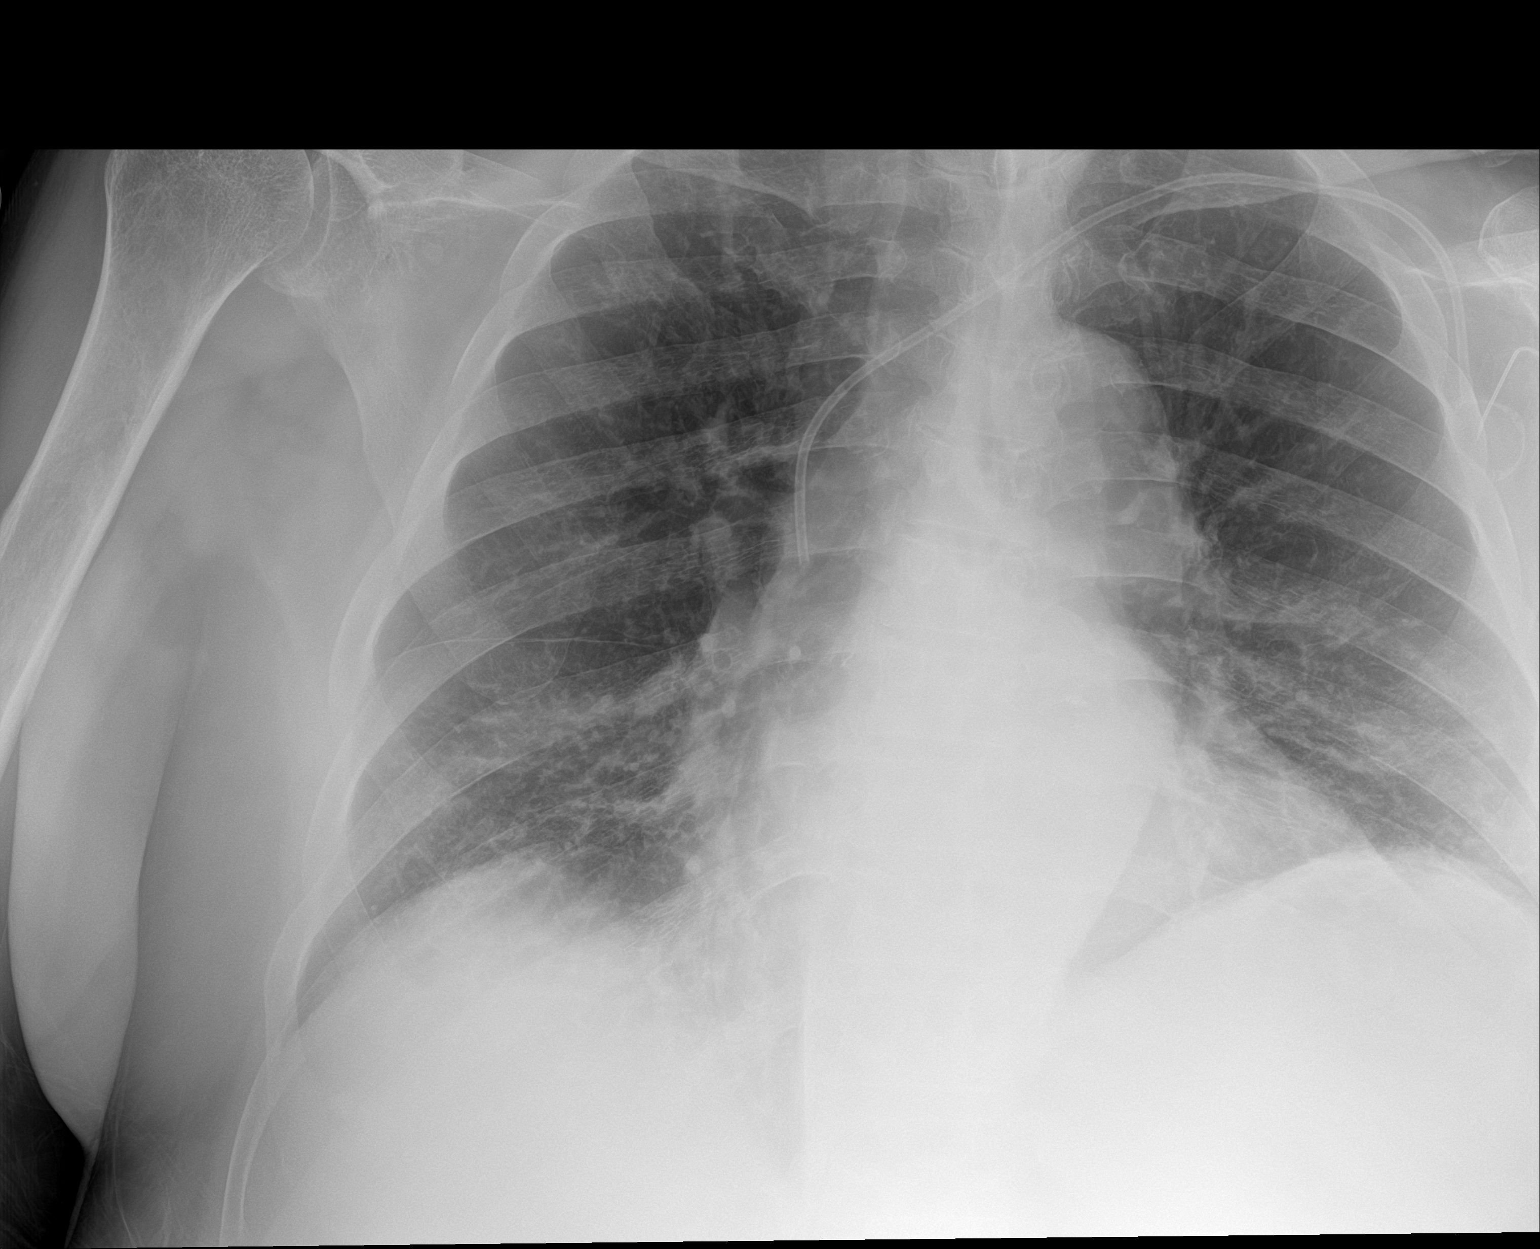

[2 of 2 positions shown; findings below may reference images not displayed]

FINDINGS: Cardiac shadow is stable. Left chest wall port is again seen. The
lungs are well aerated with mild bibasilar atelectatic changes
stable from the previous day. No new focal infiltrate or sizable
effusion is seen. No bony abnormality is noted.
IMPRESSION: Bibasilar atelectatic changes stable from the previous day.

## 2018-09-14 IMAGING — DX DG ABD PORTABLE 1V
1 series · 1 of 1 positions shown · non-contrast
Comparison: [DATE]

CLINICAL DATA: Abdominal pain

EXAM:
PORTABLE ABDOMEN - 1 VIEW

[abdomen kub]
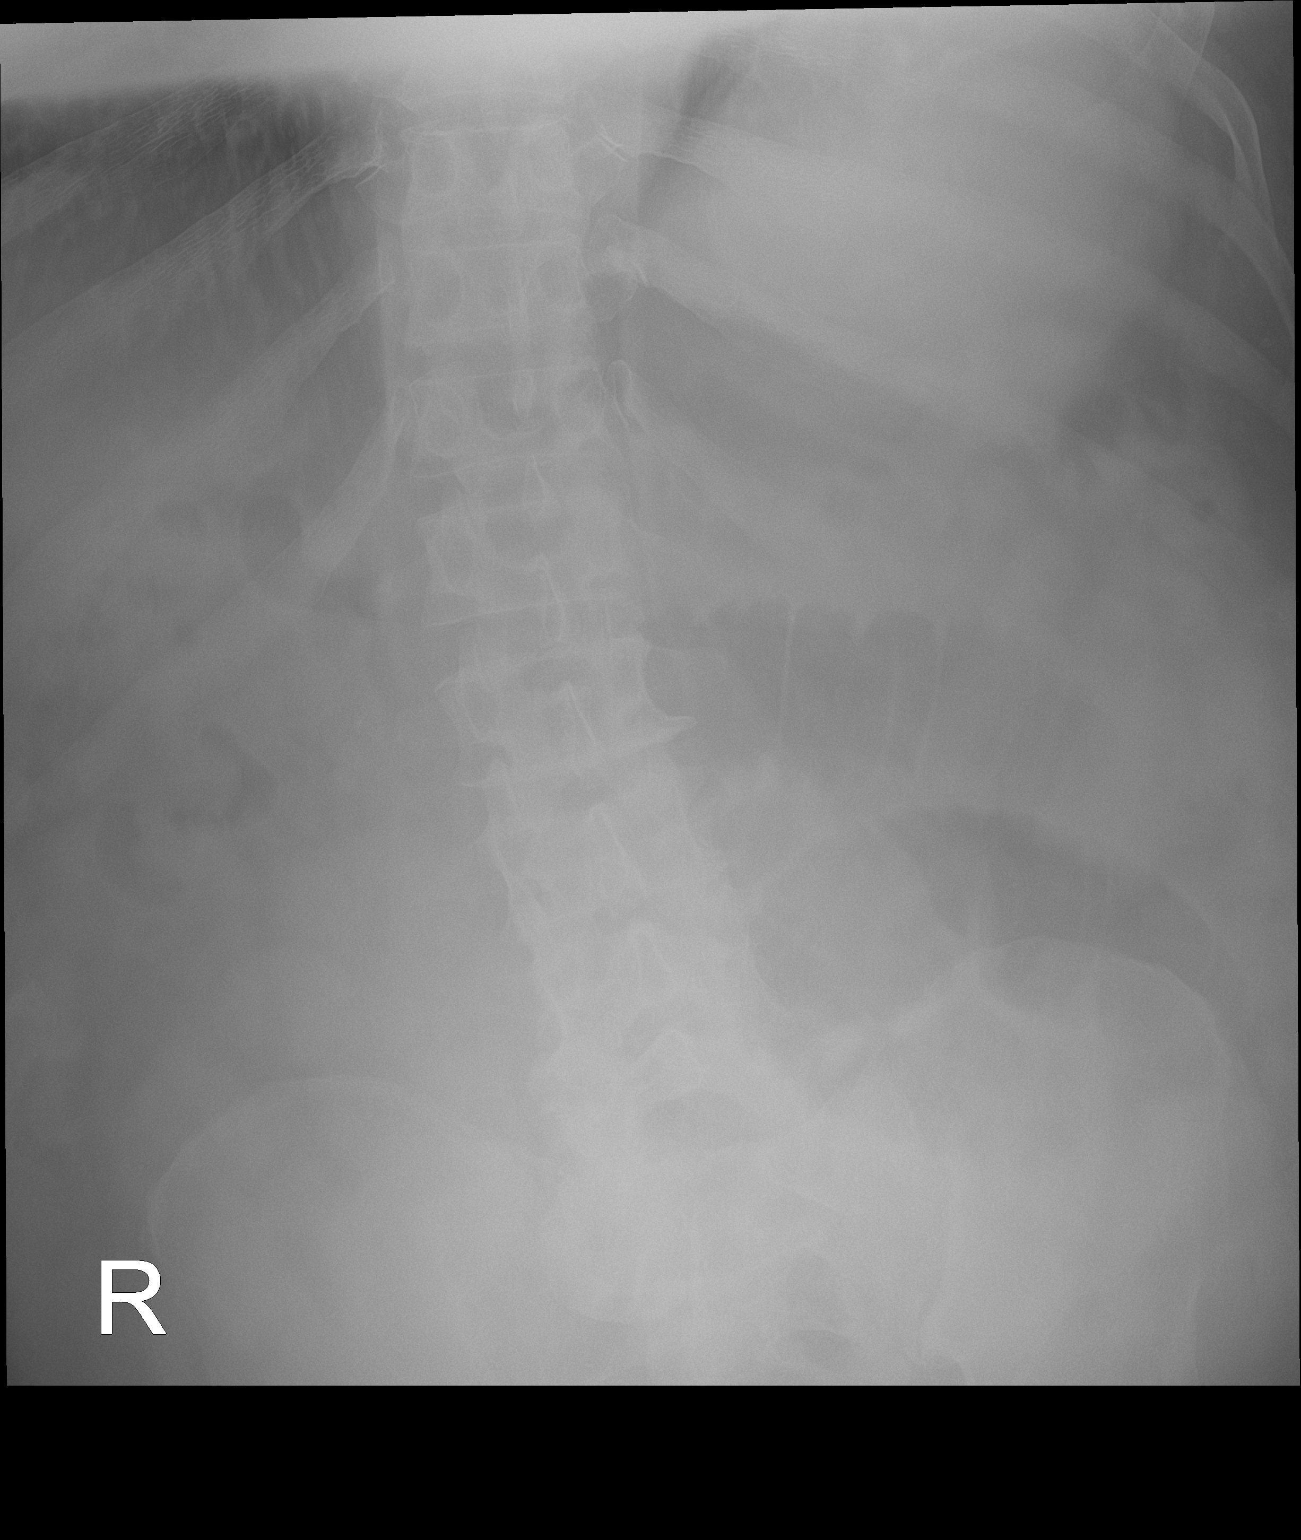

[1 of 1 positions shown; findings below may reference images not displayed]

FINDINGS: Scattered large and small bowel gas is noted. Persistent small bowel
dilatation is noted in the left mid abdomen stable from the previous
exam. No free air is noted. No acute bony abnormality is noted.
IMPRESSION: Persistent small bowel dilatation stable from previous exam.

## 2018-09-14 MED ORDER — DOCUSATE SODIUM 100 MG PO CAPS
100.0000 mg | ORAL_CAPSULE | Freq: Two times a day (BID) | ORAL | Status: DC
  Administered 2018-09-14 – 2018-09-19 (×11): 100 mg via ORAL
  Filled 2018-09-14 (×11): qty 1

## 2018-09-14 MED ORDER — ALUM & MAG HYDROXIDE-SIMETH 200-200-20 MG/5ML PO SUSP
15.0000 mL | Freq: Four times a day (QID) | ORAL | Status: DC | PRN
  Administered 2018-09-14 – 2018-09-16 (×2): 15 mL via ORAL
  Filled 2018-09-14 (×2): qty 30

## 2018-09-14 NOTE — Progress Notes (Signed)
PROGRESS NOTE    Jacob [name]  XBJ:[id number] DOB: 1945/11/18 DOA: 09/09/2018 PCP: Prince Solian, MD   Brief Narrative:  HPI per Dr. Karmen Bongo on 09/09/2018 Jacob [name] is a 73 y.o. male with medical history significant of stomach cancer in remission; OSA not on CPAP; prostate CA (2013); HTN; HLD; CKD; and BPH presenting with abdominal pain.  He reports that he was constipated and felt n/v.  He has been feeling bloated and nauseated for the last 3-4 days.  He was seen by his PCP yesterday for SOB and hemoptysis and was given an antibiotic for multifocal PNA.  No fever.  No known COVID contacts.  He reports that he does wear a mask when out in public.   ED Course:  Presented with multifocal PNA (COVID negative) and SBO.  2-3 days of cough with sputum, CXR yesterday with PCP with B PNA and given abx.  Today with n/v.  Code sepsis, given small IVF bolus.  Creatinine 2-> 4.35.  CT with SBO.  Surgery called and will place NGT.  **Interim History Patient is feeling a little bit better today no shortness of breath but still complaining of some abdominal tenderness.  Underwent a small bowel protocol via his NG tube by general surgery but pulled NG out and refused to have his NG tube replaced.  General surgery evaluated and recommended continue to leave out his NG tube and recommending ambulation for mobilization as well as trying suppository today and again today.  Clinically patient is appearing to be better however film still shows an SBO.  Patient had a bowel movement and states it was small but nursing states it was a large bowel movement.  Potassium yesterday was elevated and he was given a dose of Lokelma which improved.  Patient did have a bowel movement after suppository and general surgery recommending continuing to hold his NG despite his abdominal plain films worsening.  If he has recurrence of nausea or vomiting they are recommending replacing NG tube before considering  possible surgery.  Currently patient is having some mild intermittent nausea and did have some intermittent abdominal pain but diet is being tolerated and general surgery is advancing his diet despite his abdominal films showing some dilated small bowel.  Assessment & Plan:   Principal Problem:   Pneumonia Active Problems:   Essential hypertension   Gastric adenocarcinoma ([place])   Acute renal failure superimposed on stage 3 chronic kidney disease (HCC)   SBO (small bowel obstruction) (HCC)   PNA, improving -Patient was seen by his PCP and diagnosed with multifocal PNA -He was given PO Cefdinir -CXR indicates multifocal PNA, but it is not clear if this is significantly worse and led to ileus or if his SBO was the driving force for hospitalization -COVID negative. -CURB-65 score is 2 - admitted the patient to Med Surg. -Pneumonia Severity Index (PSI) is Class 4, 9% mortality. -Corticosteroids have been to shown to low overall mortality rate; risk of ARDS; and need for mechanical ventilation.  This is particularly true in severe PNA (class 3+ PSI).  Will add Solumedrol 40 mg daily for now and continue. -Started Azithromycin 500 mg daily AND Rocephin due to no risk factors for MDR cause. -NS @ 75cc/hr now discontinued; patient was n.p.o. but diet was advanced to clear liquid diet and then today it will be advanced to full liquid diet per general surgery recommendations -Fever control with Antipyretics of Acetaminophen 650 mg po q6hprn -Repeat CBC in am -Sputum cultures pending;  urine culture obtained and showed 20,000 colonies of Proteus mirabilis -Blood cultures x2 showed NGTD at 5 Days  -Strep pneumo testing was negative  -PCT was 10.85 -WBC has been relatively stable and around 10.5 and today is 10.4 -Repeat CXR this a.m. showed "Cardiac shadow is stable. Left chest wall port is again seen. The lungs are well aerated with mild bibasilar atelectatic changes stable from the previous day. No  new focal infiltrate or sizable effusion is seen. No bony abnormality is noted." -Started DuoNeb PRN  -C/w IV Soulmedrol 40 mg q24h for now and likely can transition to p.o. once he is taken more and tolerating a regular Diet   SBO -Patient with prior h/o abdominal surgery and gastric cancer presenting with acute onset of abdominal pain with n/v, abdominal distention, and CT findings c/w SBO -Admitted to Med-Surge  -Gen Surg consulted by ER; currently no indication for surgical intervention -This Jacob be PNA-associated ileus vs. SBO - treatment is the same either way -NPO for bowel rest initially started but now patient recommended on sips and trials of clears -NG Tube removed and general surgery recommending holding replacing for now but states that if he vomits or becomes nauseous will need to be replaced -IVF hydration as above with normal saline at a rate of 75 mL's per hour for now -Pain control with morphine 2 mg q2hprn -C/w Antiemetics with IV Zofran 4 mg q6h  -CT scan shows an SBO with transition in the mid ileal region -General surgery evaluated and have done the small bowel protocol via his NG tube but unfortunate NG tube was removed -General surgery recommending mobilization and also recommended trying bisacodyl 10 mg rectal suppository once and this was done yesterday and they are repeating it today; now they are adding milk of magnesia today -Continue to maintain electrolytes with potassium above 4 and magnesium above 2 -KUB this AM showed "Scattered large and small bowel gas is noted. Persistent small bowel dilatation is noted in the left mid abdomen stable from the previous exam. No free air is noted. No acute bony abnormality is noted." -Diet advancement per general surgery and further care per their recommendations: Currently on FULL LIQUID DIET  -Further care per general surgery  Gastric adenocarcinoma/prostate CA -Gastric CA diagnosed in 10/17, s/p distal gastrectomy in  2018; he remains on active surveillance without evidence of recurrent disease. -s/p seed implants for prostate CA in 2013; continue Tamsulosin -We will need follow-up with oncology in the outpatient setting  Acute Renal Failure on CKD stage III, improving s Hyperphosphatemia, improved Metabolic acidosis and Hyperchloremia -Patient with acute infection (PNA) in addition to n/v with SBO - so very likely prerenal azotemia -He does have mild ketonuria -He has baseline stage 3-4 CKD but clearly markedly worse now -BUN/creatinine is improving went from 82/4.35 is now 55/2.53 -Will continue to rehydrate as above given SBO and clear liquid diet -Patient CO2 was 18, anion gap was 13; currently now CO2 is 20 and anion gap is 8; chloride level is now 111 -Patient's phosphorus was 5.2 and is now 4.5 -Continue to hold his Furosemide 80 milligrams every other day for now but Jacob resume in AM as Legs were more swollen -IV Fluids now stopped given worsening leg swelling  -Avoid nephrotoxic agents, contrast dyes, hypotension if possible Repeat CMP in a.m.  HTN -Continue with IV hydralazine 5 mg every 4 PRN for systolic blood pressure greater than 160 and currently holding p.o. hydralazine -Will change Bystolic to IV  Labetalol, standing q6h; continue with labetalol 5 mg every 6 scheduled -Blood Pressure was elevated at 156/109 today  OSA -Does not wear CPAP  Obesity -Estimated body mass index is 30.81 kg/m as calculated from the following:   Height as of this encounter: 6\' 2"  (1.88 m).   Weight as of this encounter: 108.9 kg. -Weight Loss and Dietary Counseling Given  GERD/peptic ulcer disease/stomach cancer -Placed on empiric PPI IV given that he is on steroids and has a history of peptic ulcer disease  Normocytic Anemia/Anemia of Chronic Kidney Disease -Patient hemoglobin/hematocrit went from 11.7/34.2 and is now 10.1/30.5 and question dilutional drop in the setting of IV fluid  resuscitation; IVF now stopped  -Check anemia panel in the a.m. check anemia panel showed an iron level of 108, U IBC 154, TIBC of 262, saturation ratios of 41%, ferritin of 396, folate level 30.6, vitamin B12 of 404  -Continue to monitor for signs and symptoms of bleeding; currently no overt bleeding noted -Repeat CBC in the a.m.  Hyperkalemia -Likely in the setting of renal disease -Improved and K+ is now 5.1 and was 5.9 a few days ago; -Given Lokelma x1 this hospitalization  -Continue monitor and trend CMP for potassium level  DVT prophylaxis: Heparin 5,000 units sq q8h Code Status: FULL CODE  Family Communication: No family present at bedside  Disposition Plan: Maintain patient for continued management and treatment until patient tolerates p.o. and improved with his pneumonia as well as small bowel obstruction  Consultants:   General Surgery   Procedures: SBO protocol  KUB; CXR  Antimicrobials:  Anti-infectives (From admission, onward)   Start     Dose/Rate Route Frequency Ordered Stop   09/09/18 1100  cefTRIAXone (ROCEPHIN) 2 g in sodium chloride 0.9 % 100 mL IVPB     2 g 200 mL/hr over 30 Minutes Intravenous Every 24 hours 09/09/18 1051     09/09/18 1100  azithromycin (ZITHROMAX) 500 mg in sodium chloride 0.9 % 250 mL IVPB     500 mg 250 mL/hr over 60 Minutes Intravenous Every 24 hours 09/09/18 1051       Subjective: Seen and examined at bedside he was resting in bed and had the covers pulled over his head.  States that he has intermittent abdominal pain but feels like he is improving some.  Passing a little bit of gas and had a small bowel movement earlier.  Ambulating the halls well.  Nurse states that he was nauseous this morning slightly but this is improved and patient thinks that he Jacob have some acid reflux.  Continues to belch.  No other concerns or complaints at this time.  Objective: Vitals:   09/13/18 1329 09/13/18 2339 09/14/18 0532 09/14/18 0755  BP: (!)  164/106 (!) 166/94 (!) 156/105 (!) 156/109  Pulse: 83 (!) 54 68 79  Resp: 16   14  Temp: (!) 97.5 F (36.4 C) 98.4 F (36.9 C)  98.4 F (36.9 C)  TempSrc: Oral Oral  Oral  SpO2: 96% 98%  98%  Weight:      Height:        Intake/Output Summary (Last 24 hours) at 09/14/2018 1410 Last data filed at 09/14/2018 1000 Gross per 24 hour  Intake 4543.04 ml  Output 175 ml  Net 4368.04 ml   Filed Weights   09/09/18 0737  Weight: 108.9 kg   Examination: Physical Exam:  Constitutional: Well-nourished, well-developed obese African-American male currently no acute distress laying in bed wanting to sleep. Eyes:  Lids and conjunctive are normal.  Sclerae anicteric ENMT: External ears and nose appear normal.  Grossly normal hearing Neck: Appears supple no JVD Respiratory: Diminished auscultation bilaterally with no appreciable wheezing, rales, rhonchi.  Patient not tachypneic or using accessory muscles to breathe Cardiovascular: Regular rate and rhythm.  No appreciable murmurs, rubs, gallops.  Has 1+ lower extremity edema noted today Abdomen: Soft, mildly distended, slightly tender to palpate.  Bowel sounds are present. GU: Deferred Musculoskeletal: No contractures or cyanosis.  No joint fomites naproxen Skin: Skin is warm and dry no appreciable rashes or lesions on to skin evaluation Neurologic: Cranial nerves II through XII grossly intact no appreciable focal deficits.  Romberg sign cerebellar reflexes were not assessed Psychiatric: Has a normal judgment intact.  Patient is awake, alert, oriented x3.  Mildly anxious  Data Reviewed: I have personally reviewed following labs and imaging studies  CBC: Recent Labs  Lab 09/10/18 0737 09/11/18 0530 09/12/18 0714 09/13/18 0641 09/14/18 0431  WBC 11.9* 12.7* 10.3 10.5 10.4  NEUTROABS 10.9* 10.3* 7.9* 7.8* 8.6*  HGB 10.2* 9.8* 10.3* 10.1* 10.1*  HCT 30.0* 28.7* 30.1* 30.4* 30.5*  MCV 94.0 93.8 94.4 94.4 96.2  PLT 209 261 283 300 366   Basic  Metabolic Panel: Recent Labs  Lab 09/10/18 0737 09/11/18 0530 09/12/18 0714 09/12/18 1027 09/13/18 0641 09/14/18 0431  NA 135 139 140 142 140 139  K 5.1 5.0 5.9* 5.1 4.9 5.1  CL 104 108 108 111 113* 111  CO2 18* 21* 19* 19* 18* 20*  GLUCOSE 118* 106* 94 105* 90 115*  BUN 78* 79* 80* 77* 70* 55*  CREATININE 3.39* 3.19* 3.22* 3.11* 2.89* 2.53*  CALCIUM 9.6 9.8 10.0 9.9 10.0 9.9  MG 2.3 2.6* 2.7*  --  2.5* 2.4  PHOS 5.2* 4.2 4.5  --  4.5 4.2   GFR: Estimated Creatinine Clearance: 34.7 mL/min (A) (by C-G formula based on SCr of 2.53 mg/dL (H)). Liver Function Tests: Recent Labs  Lab 09/10/18 0737 09/11/18 0530 09/12/18 0714 09/13/18 0641 09/14/18 0431  AST 16 13* 24 19 19   ALT 11 11 11 12 12   ALKPHOS 62 57 59 46 47  BILITOT 0.7 0.7 1.3* 0.9 0.8  PROT 6.6 6.4* 6.3* 6.6 6.7  ALBUMIN 2.5* 2.5* 2.7* 2.8* 2.9*   Recent Labs  Lab 09/09/18 0805  LIPASE 39   No results for input(s): AMMONIA in the last 168 hours. Coagulation Profile: No results for input(s): INR, PROTIME in the last 168 hours. Cardiac Enzymes: No results for input(s): CKTOTAL, CKMB, CKMBINDEX, TROPONINI in the last 168 hours. BNP (last 3 results) No results for input(s): PROBNP in the last 8760 hours. HbA1C: No results for input(s): HGBA1C in the last 72 hours. CBG: No results for input(s): GLUCAP in the last 168 hours. Lipid Profile: No results for input(s): CHOL, HDL, LDLCALC, TRIG, CHOLHDL, LDLDIRECT in the last 72 hours. Thyroid Function Tests: No results for input(s): TSH, T4TOTAL, FREET4, T3FREE, THYROIDAB in the last 72 hours. Anemia Panel: Recent Labs    09/12/18 0714  VITAMINB12 404  FOLATE 13.6  FERRITIN 396*  TIBC 262  IRON 108  RETICCTPCT 2.2   Sepsis Labs: Recent Labs  Lab 09/09/18 1128 09/09/18 1917  PROCALCITON  --  10.85  LATICACIDVEN 1.1  --     Recent Results (from the past 240 hour(s))  SARS Coronavirus 2 [facility] order, Performed in [facility] hospital lab)  Nasopharyngeal Nasopharyngeal Swab     Status: None   Collection  Time: 09/09/18  8:10 AM   Specimen: Nasopharyngeal Swab  Result Value Ref Range Status   SARS Coronavirus 2 NEGATIVE NEGATIVE Final    Comment: (NOTE) If result is NEGATIVE SARS-CoV-2 target nucleic acids are NOT DETECTED. The SARS-CoV-2 RNA is generally detectable in upper and lower  respiratory specimens during the acute phase of infection. The lowest  concentration of SARS-CoV-2 viral copies this assay can detect is 250  copies / mL. A negative result does not preclude SARS-CoV-2 infection  and should not be used as the sole basis for treatment or other  patient management decisions.  A negative result Jacob occur with  improper specimen collection / handling, submission of specimen other  than nasopharyngeal swab, presence of viral mutation(s) within the  areas targeted by this assay, and inadequate number of viral copies  (<250 copies / mL). A negative result must be combined with clinical  observations, patient history, and epidemiological information. If result is POSITIVE SARS-CoV-2 target nucleic acids are DETECTED. The SARS-CoV-2 RNA is generally detectable in upper and lower  respiratory specimens dur ing the acute phase of infection.  Positive  results are indicative of active infection with SARS-CoV-2.  Clinical  correlation with patient history and other diagnostic information is  necessary to determine patient infection status.  Positive results do  not rule out bacterial infection or co-infection with other viruses. If result is PRESUMPTIVE POSTIVE SARS-CoV-2 nucleic acids Jacob BE PRESENT.   A presumptive positive result was obtained on the submitted specimen  and confirmed on repeat testing.  While 2019 novel coronavirus  (SARS-CoV-2) nucleic acids Jacob be present in the submitted sample  additional confirmatory testing Jacob be necessary for epidemiological  and / or clinical management purposes  to  differentiate between  SARS-CoV-2 and other Sarbecovirus currently known to infect humans.  If clinically indicated additional testing with an alternate test  methodology [phone]) is advised. The SARS-CoV-2 RNA is generally  detectable in upper and lower respiratory sp ecimens during the acute  phase of infection. The expected result is Negative. Fact Sheet for Patients:  StrictlyIdeas.no Fact Sheet for Healthcare Providers: BankingDealers.co.za This test is not yet approved or cleared by the [place] FDA and has been authorized for detection and/or diagnosis of SARS-CoV-2 by FDA under an Emergency Use Authorization (EUA).  This EUA will remain in effect (meaning this test can be used) for the duration of the COVID-19 declaration under Section 564(b)(1) of the Act, 21 U.S.C. section 360bbb-3(b)(1), unless the authorization is terminated or revoked sooner. Performed at [facility], [place] [address] [place], [place] [address]   Urine culture     Status: Abnormal   Collection Time: 09/09/18 10:03 AM   Specimen: Urine, Clean Catch  Result Value Ref Range Status   Specimen Description URINE, CLEAN CATCH  Final   Special Requests   Final    NONE Performed at [facility], [place] [address], [place], [place] [address]    Culture 20,000 COLONIES/mL PROTEUS MIRABILIS (A)  Final   Report Status 09/11/2018 FINAL  Final   Organism ID, Bacteria PROTEUS MIRABILIS (A)  Final      Susceptibility   Proteus mirabilis - MIC*    AMPICILLIN <=2 SENSITIVE Sensitive     CEFAZOLIN <=4 SENSITIVE Sensitive     CEFTRIAXONE <=1 SENSITIVE Sensitive     CIPROFLOXACIN <=0.25 SENSITIVE Sensitive     GENTAMICIN <=1 SENSITIVE Sensitive     IMIPENEM 2 SENSITIVE Sensitive  NITROFURANTOIN 128 RESISTANT Resistant     TRIMETH/SULFA <=20 SENSITIVE Sensitive     AMPICILLIN/SULBACTAM <=2 SENSITIVE Sensitive     PIP/TAZO <=4 SENSITIVE Sensitive     *  20,000 COLONIES/mL PROTEUS MIRABILIS  Blood Culture (routine x 2)     Status: None   Collection Time: 09/09/18 11:20 AM   Specimen: BLOOD  Result Value Ref Range Status   Specimen Description BLOOD LEFT ANTECUBITAL  Final   Special Requests   Final    BOTTLES DRAWN AEROBIC AND ANAEROBIC Blood Culture adequate volume   Culture   Final    NO GROWTH 5 DAYS Performed at [facility], [place] [address], [place], [place] [address]    Report Status 09/14/2018 FINAL  Final  Blood Culture (routine x 2)     Status: None   Collection Time: 09/09/18 11:22 AM   Specimen: BLOOD LEFT HAND  Result Value Ref Range Status   Specimen Description BLOOD LEFT HAND  Final   Special Requests   Final    BOTTLES DRAWN AEROBIC ONLY Blood Culture results Jacob not be optimal due to an inadequate volume of blood received in culture bottles   Culture   Final    NO GROWTH 5 DAYS Performed at [facility], [place] [address], [place], [place] [address]    Report Status 09/14/2018 FINAL  Final    Radiology Studies: Dg Abd 1 View  Result Date: 09/13/2018 CLINICAL DATA:  Small bowel obstruction. EXAM: ABDOMEN - 1 VIEW COMPARISON:  09/12/2018 FINDINGS: Probable slightly increased prominence of dilated small bowel loops. There is some air and fecal material visualized in the proximal colon. No free air identified. IMPRESSION: Slight worsening of partial small bowel obstruction. Electronically Signed   By: Aletta Edouard M.D.   On: 09/13/2018 08:06   Dg Chest Port 1 View  Result Date: 09/14/2018 CLINICAL DATA:  Shortness of breath EXAM: PORTABLE CHEST 1 VIEW COMPARISON:  09/13/2018 FINDINGS: Cardiac shadow is stable. Left chest wall port is again seen. The lungs are well aerated with mild bibasilar atelectatic changes stable from the previous day. No new focal infiltrate or sizable effusion is seen. No bony abnormality is noted. IMPRESSION: Bibasilar atelectatic changes stable from the previous day. Electronically  Signed   By: Inez Catalina M.D.   On: 09/14/2018 09:01   Dg Chest Port 1 View  Result Date: 09/13/2018 CLINICAL DATA:  ? Smbo, no chest complaints EXAM: PORTABLE CHEST 1 VIEW COMPARISON:  Chest radiographs dated 09/12/2018, 09/10/2018 FINDINGS: Unchanged position of a left central venous catheter. Stable enlarged cardiomediastinal contours. Stable bibasilar streaky pulmonary opacities. No pneumothorax or large pleural effusion. Visualized skeleton unremarkable. IMPRESSION: Persistent bibasilar streaky pulmonary opacities possibly representing atelectasis or infection. Electronically Signed   By: Audie Pinto M.D.   On: 09/13/2018 08:07   Dg Abd Portable 1v  Result Date: 09/14/2018 CLINICAL DATA:  Abdominal pain EXAM: PORTABLE ABDOMEN - 1 VIEW COMPARISON:  09/13/2018 FINDINGS: Scattered large and small bowel gas is noted. Persistent small bowel dilatation is noted in the left mid abdomen stable from the previous exam. No free air is noted. No acute bony abnormality is noted. IMPRESSION: Persistent small bowel dilatation stable from previous exam. Electronically Signed   By: Inez Catalina M.D.   On: 09/14/2018 09:03   Scheduled Meds: . docusate sodium  100 mg Oral BID  . heparin  5,000 Units Subcutaneous Q8H  . labetalol  5 mg Intravenous Q6H  . methylPREDNISolone (SOLU-MEDROL)  injection  40 mg Intravenous Q24H  . pantoprazole (PROTONIX) IV  40 mg Intravenous Q24H  . polyethylene glycol  17 g Oral Daily   Continuous Infusions: . azithromycin 500 mg (09/14/18 1015)  . cefTRIAXone (ROCEPHIN)  IV 2 g (09/14/18 0933)    LOS: 5 days   Kerney Elbe, DO [facility] PAGER is on [place]  If 7PM-7AM, please contact night-coverage [email or web address] Password [email or web address] 09/14/2018, 2:10 PM

## 2018-09-14 NOTE — Progress Notes (Signed)
[facility] Progress Note     Subjective: CC: sbo Patient denies nausea or emesis. Had another BM yesterday, passing more flatus. Abdominal pain slightly improved. Tolerated CLD.   Objective: Vital signs in last 24 hours: Temp:  [97.5 F (36.4 C)-98.4 F (36.9 C)] 98.4 F (36.9 C) (08/31 2339) Pulse Rate:  [54-83] 68 (09/01 0532) Resp:  [16-18] 16 (08/31 1329) BP: (156-184)/(94-106) 156/105 (09/01 0532) SpO2:  [96 %-98 %] 98 % (08/31 2339) Last BM Date: 09/13/18  Intake/Output from previous day: 08/31 0701 - 09/01 0700 In: 3952.7 [P.O.:600; I.V.:2960.5; IV Piggyback:392.3] Out: -  Intake/Output this shift: No intake/output data recorded.  PE: Gen: Alert, NAD, pleasant Card: Regular rate and rhythm Pulm: Normal effort, nasal cannula present Abd: Soft, non-tender,moderatelydistended,hypoactive BS Skin: warm and dry, no rashes  Psych: A&Ox3  Lab Results:  Recent Labs    09/13/18 0641 09/14/18 0431  WBC 10.5 10.4  HGB 10.1* 10.1*  HCT 30.4* 30.5*  PLT 300 291   BMET Recent Labs    09/13/18 0641 09/14/18 0431  NA 140 139  K 4.9 5.1  CL 113* 111  CO2 18* 20*  GLUCOSE 90 115*  BUN 70* 55*  CREATININE 2.89* 2.53*  CALCIUM 10.0 9.9   PT/INR No results for input(s): LABPROT, INR in the last 72 hours. CMP     Component Value Date/Time   NA 139 09/14/2018 0431   NA 141 01/16/2017 0924   K 5.1 09/14/2018 0431   K 4.8 01/16/2017 0924   CL 111 09/14/2018 0431   CO2 20 (L) 09/14/2018 0431   CO2 25 01/16/2017 0924   GLUCOSE 115 (H) 09/14/2018 0431   GLUCOSE 80 01/16/2017 0924   BUN 55 (H) 09/14/2018 0431   BUN 24.0 01/16/2017 0924   CREATININE 2.53 (H) 09/14/2018 0431   CREATININE 2.46 (H) 04/15/2018 1158   CREATININE 1.9 (H) 01/16/2017 0924   CALCIUM 9.9 09/14/2018 0431   CALCIUM 9.8 01/16/2017 0924   PROT 6.7 09/14/2018 0431   PROT 7.1 01/16/2017 0924   ALBUMIN 2.9 (L) 09/14/2018 0431   ALBUMIN 3.2 (L) 01/16/2017 0924   AST 19  09/14/2018 0431   AST 18 04/15/2018 1158   AST 16 01/16/2017 0924   ALT 12 09/14/2018 0431   ALT 13 04/15/2018 1158   ALT 10 01/16/2017 0924   ALKPHOS 47 09/14/2018 0431   ALKPHOS 54 01/16/2017 0924   BILITOT 0.8 09/14/2018 0431   BILITOT 0.4 04/15/2018 1158   BILITOT 0.54 01/16/2017 0924   GFRNONAA 24 (L) 09/14/2018 0431   GFRNONAA 25 (L) 04/15/2018 1158   GFRAA 28 (L) 09/14/2018 0431   GFRAA 29 (L) 04/15/2018 1158   Lipase     Component Value Date/Time   LIPASE 39 09/09/2018 0805       Studies/Results: Dg Abd 1 View  Result Date: 09/13/2018 CLINICAL DATA:  Small bowel obstruction. EXAM: ABDOMEN - 1 VIEW COMPARISON:  09/12/2018 FINDINGS: Probable slightly increased prominence of dilated small bowel loops. There is some air and fecal material visualized in the proximal colon. No free air identified. IMPRESSION: Slight worsening of partial small bowel obstruction. Electronically Signed   By: Aletta Edouard M.D.   On: 09/13/2018 08:06   Dg Chest Port 1 View  Result Date: 09/13/2018 CLINICAL DATA:  ? Smbo, no chest complaints EXAM: PORTABLE CHEST 1 VIEW COMPARISON:  Chest radiographs dated 09/12/2018, 09/10/2018 FINDINGS: Unchanged position of a left central venous catheter. Stable enlarged cardiomediastinal contours. Stable bibasilar streaky pulmonary  opacities. No pneumothorax or large pleural effusion. Visualized skeleton unremarkable. IMPRESSION: Persistent bibasilar streaky pulmonary opacities possibly representing atelectasis or infection. Electronically Signed   By: Audie Pinto M.D.   On: 09/13/2018 08:07    Anti-infectives: Anti-infectives (From admission, onward)   Start     Dose/Rate Route Frequency Ordered Stop   09/09/18 1100  cefTRIAXone (ROCEPHIN) 2 g in sodium chloride 0.9 % 100 mL IVPB     2 g 200 mL/hr over 30 Minutes Intravenous Every 24 hours 09/09/18 1051     09/09/18 1100  azithromycin (ZITHROMAX) 500 mg in sodium chloride 0.9 % 250 mL IVPB     500  mg 250 mL/hr over 60 Minutes Intravenous Every 24 hours 09/09/18 1051         Assessment/Plan Hx of gastric cancer - s/p distal gastrectomy in 2018 Dr. Barry Dienes Hx of prostate cancer - s/p radiation AKI on CKD -improving, IVF per primary  Constipation BPH GERD HTN ?COPD - 2ppd smoking hx  Multifocal pneumonia- seen on CXR and CT, COVID negative, perprimaryservice  SBO vs ileus - CT with sbo with transition in mid ileal region - hx of prior abdominal surgery so could be sbo vs reactive ileus from PNA - can leave NGT out, but if patient becomes nauseated or vomits would replace NGT -filmthis AM with dilated loops and psbo but clinically patient having some bowel function and no peritonitis - increase to FLD -miralax and colace for bowel regimen -MOBILIZE!!!!  FEN:FLD, IVF VTE: SCDs,SQ heparin ID: azithromycin/rocephin  LOS: 5 days    Brigid Re , [facility] Surgery 09/14/2018, 7:33 AM Pager: [phone] Consults: [phone] 7:00 AM - 4:30 PM M, W-F 7:00 AM - 11:30 AM Tues, Sat, Sun

## 2018-09-14 NOTE — Telephone Encounter (Signed)
Left message on machine to call back to reschedule appointment. 

## 2018-09-15 ENCOUNTER — Inpatient Hospital Stay (HOSPITAL_COMMUNITY): Payer: Medicare Other

## 2018-09-15 DIAGNOSIS — N183 Chronic kidney disease, stage 3 unspecified: Secondary | ICD-10-CM

## 2018-09-15 DIAGNOSIS — N186 End stage renal disease: Secondary | ICD-10-CM

## 2018-09-15 DIAGNOSIS — N179 Acute kidney failure, unspecified: Secondary | ICD-10-CM

## 2018-09-15 LAB — COMPREHENSIVE METABOLIC PANEL
ALT: 13 U/L (ref 0–44)
AST: 18 U/L (ref 15–41)
Albumin: 2.8 g/dL — ABNORMAL LOW (ref 3.5–5.0)
Alkaline Phosphatase: 44 U/L (ref 38–126)
Anion gap: 11 (ref 5–15)
BUN: 50 mg/dL — ABNORMAL HIGH (ref 8–23)
CO2: 20 mmol/L — ABNORMAL LOW (ref 22–32)
Calcium: 9.9 mg/dL (ref 8.9–10.3)
Chloride: 108 mmol/L (ref 98–111)
Creatinine, Ser: 2.52 mg/dL — ABNORMAL HIGH (ref 0.61–1.24)
GFR calc Af Amer: 28 mL/min — ABNORMAL LOW (ref >60)
GFR calc non Af Amer: 24 mL/min — ABNORMAL LOW (ref >60)
Glucose, Bld: 102 mg/dL — ABNORMAL HIGH (ref 70–99)
Potassium: 5.2 mmol/L — ABNORMAL HIGH (ref 3.5–5.1)
Sodium: 139 mmol/L (ref 135–145)
Total Bilirubin: 0.8 mg/dL (ref 0.3–1.2)
Total Protein: 6.2 g/dL — ABNORMAL LOW (ref 6.5–8.1)

## 2018-09-15 LAB — CBC WITH DIFFERENTIAL/PLATELET
Abs Immature Granulocytes: 0.13 10*3/uL — ABNORMAL HIGH (ref 0.00–0.07)
Basophils Absolute: 0 10*3/uL (ref 0.0–0.1)
Basophils Relative: 0 %
Eosinophils Absolute: 0 10*3/uL (ref 0.0–0.5)
Eosinophils Relative: 0 %
HCT: 29.7 % — ABNORMAL LOW (ref 39.0–52.0)
Hemoglobin: 9.6 g/dL — ABNORMAL LOW (ref 13.0–17.0)
Immature Granulocytes: 1 %
Lymphocytes Relative: 8 %
Lymphs Abs: 1.1 10*3/uL (ref 0.7–4.0)
MCH: 31.7 pg (ref 26.0–34.0)
MCHC: 32.3 g/dL (ref 30.0–36.0)
MCV: 98 fL (ref 80.0–100.0)
Monocytes Absolute: 0.6 10*3/uL (ref 0.1–1.0)
Monocytes Relative: 4 %
Neutro Abs: 12.4 10*3/uL — ABNORMAL HIGH (ref 1.7–7.7)
Neutrophils Relative %: 87 %
Platelets: 259 10*3/uL (ref 150–400)
RBC: 3.03 MIL/uL — ABNORMAL LOW (ref 4.22–5.81)
RDW: 15.5 % (ref 11.5–15.5)
WBC: 14.3 10*3/uL — ABNORMAL HIGH (ref 4.0–10.5)
nRBC: 0 % (ref 0.0–0.2)

## 2018-09-15 LAB — PHOSPHORUS: Phosphorus: 4 mg/dL (ref 2.5–4.6)

## 2018-09-15 LAB — MAGNESIUM: Magnesium: 2.3 mg/dL (ref 1.7–2.4)

## 2018-09-15 IMAGING — CR DG ABDOMEN 1V
3 series · 3 of 3 positions shown · non-contrast
Comparison: Radiograph [DATE].

CLINICAL DATA: Abdominal pain.

EXAM:
ABDOMEN - 1 VIEW

[abdomen kub (1 of 3)]
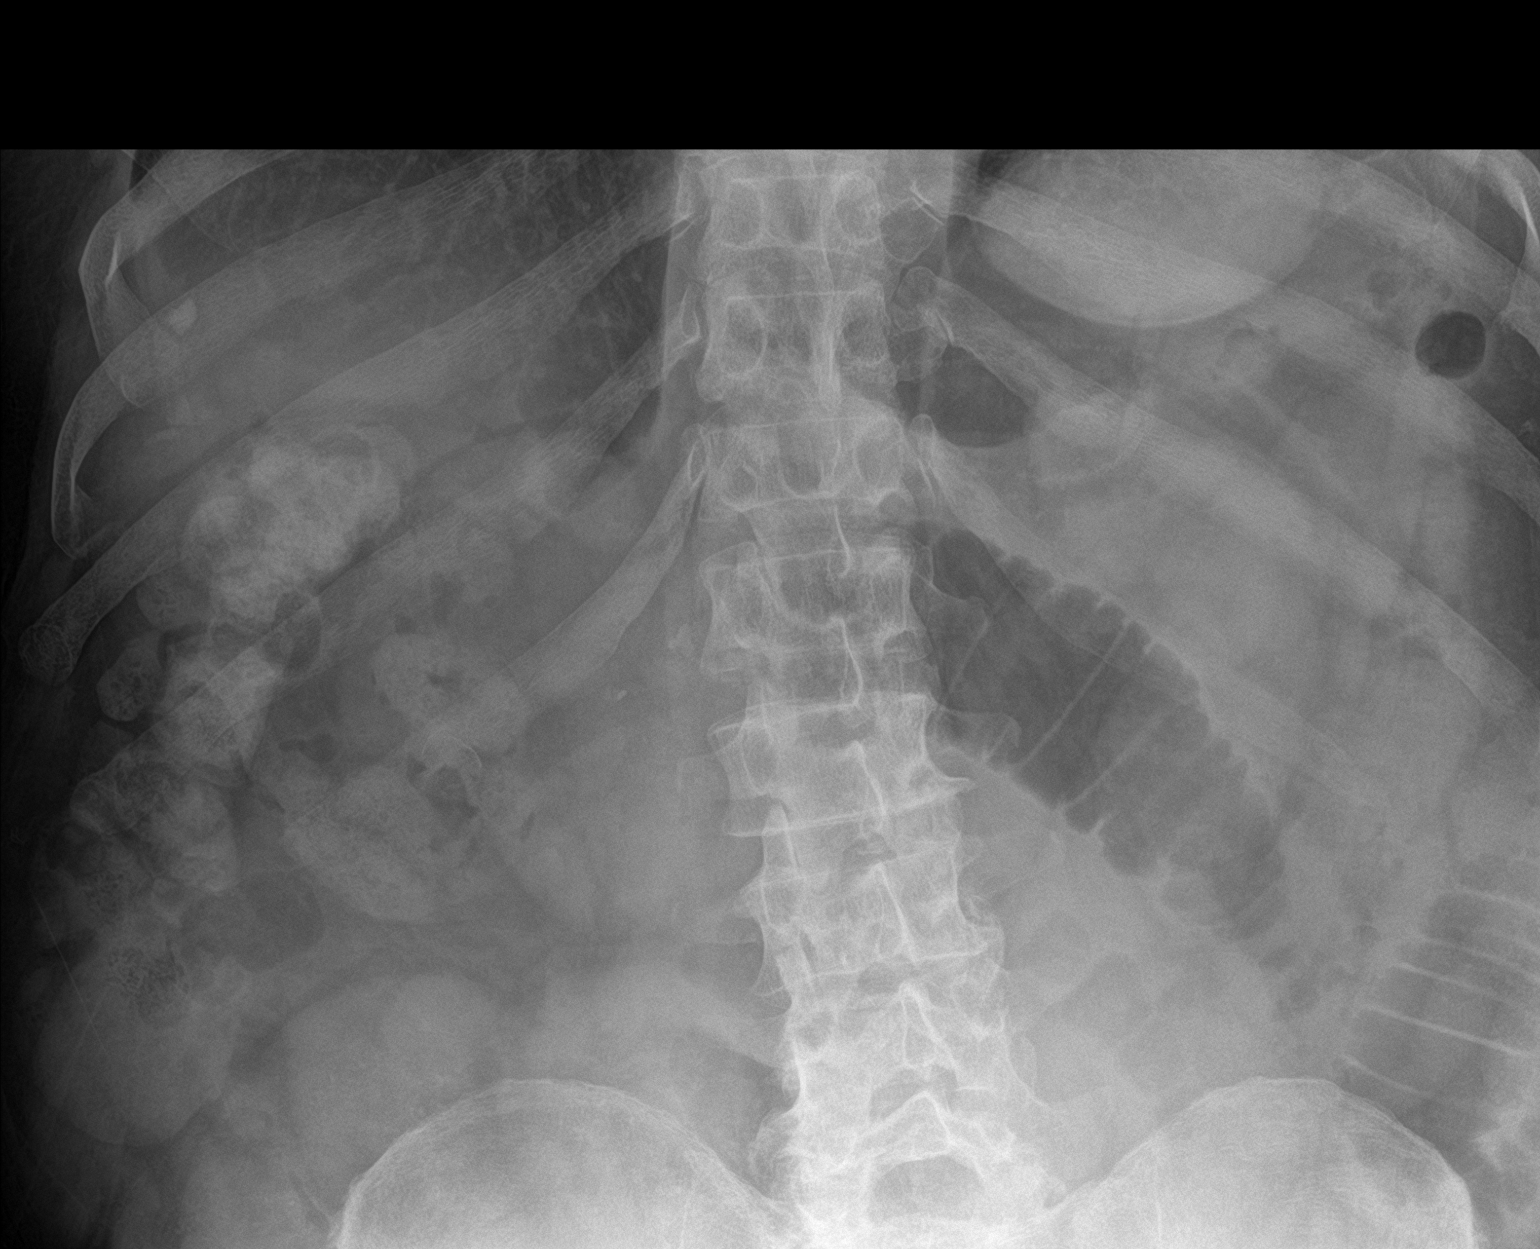

[abdomen kub (2 of 3)]
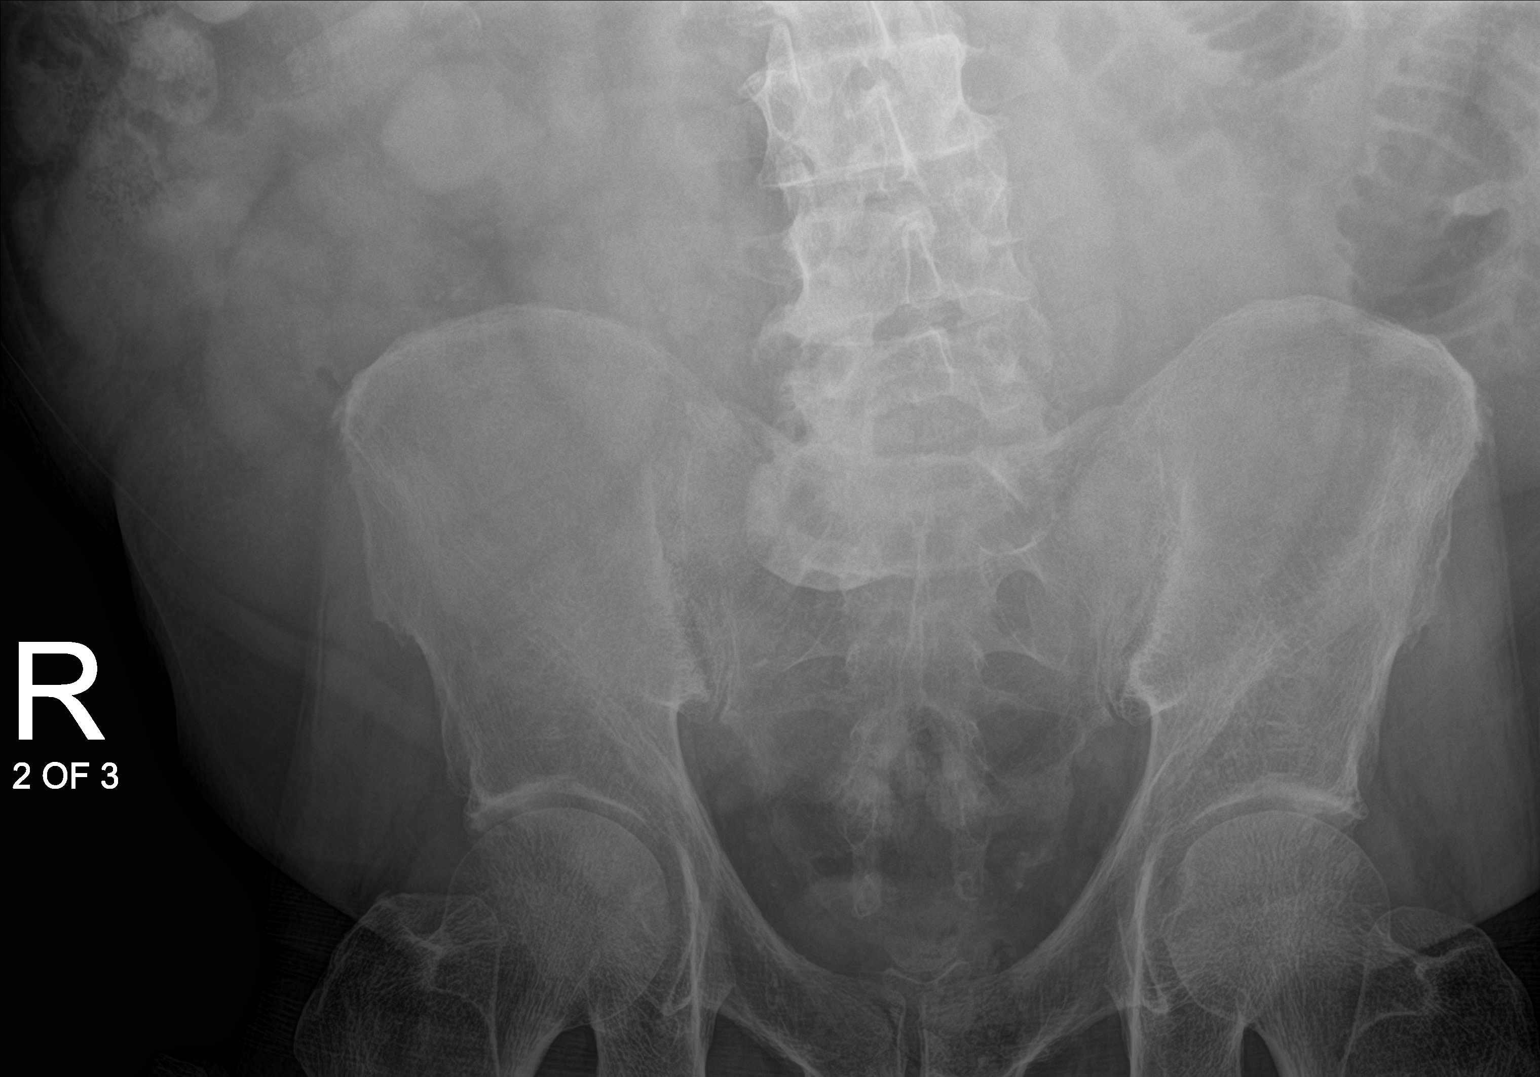

[abdomen kub (3 of 3)]
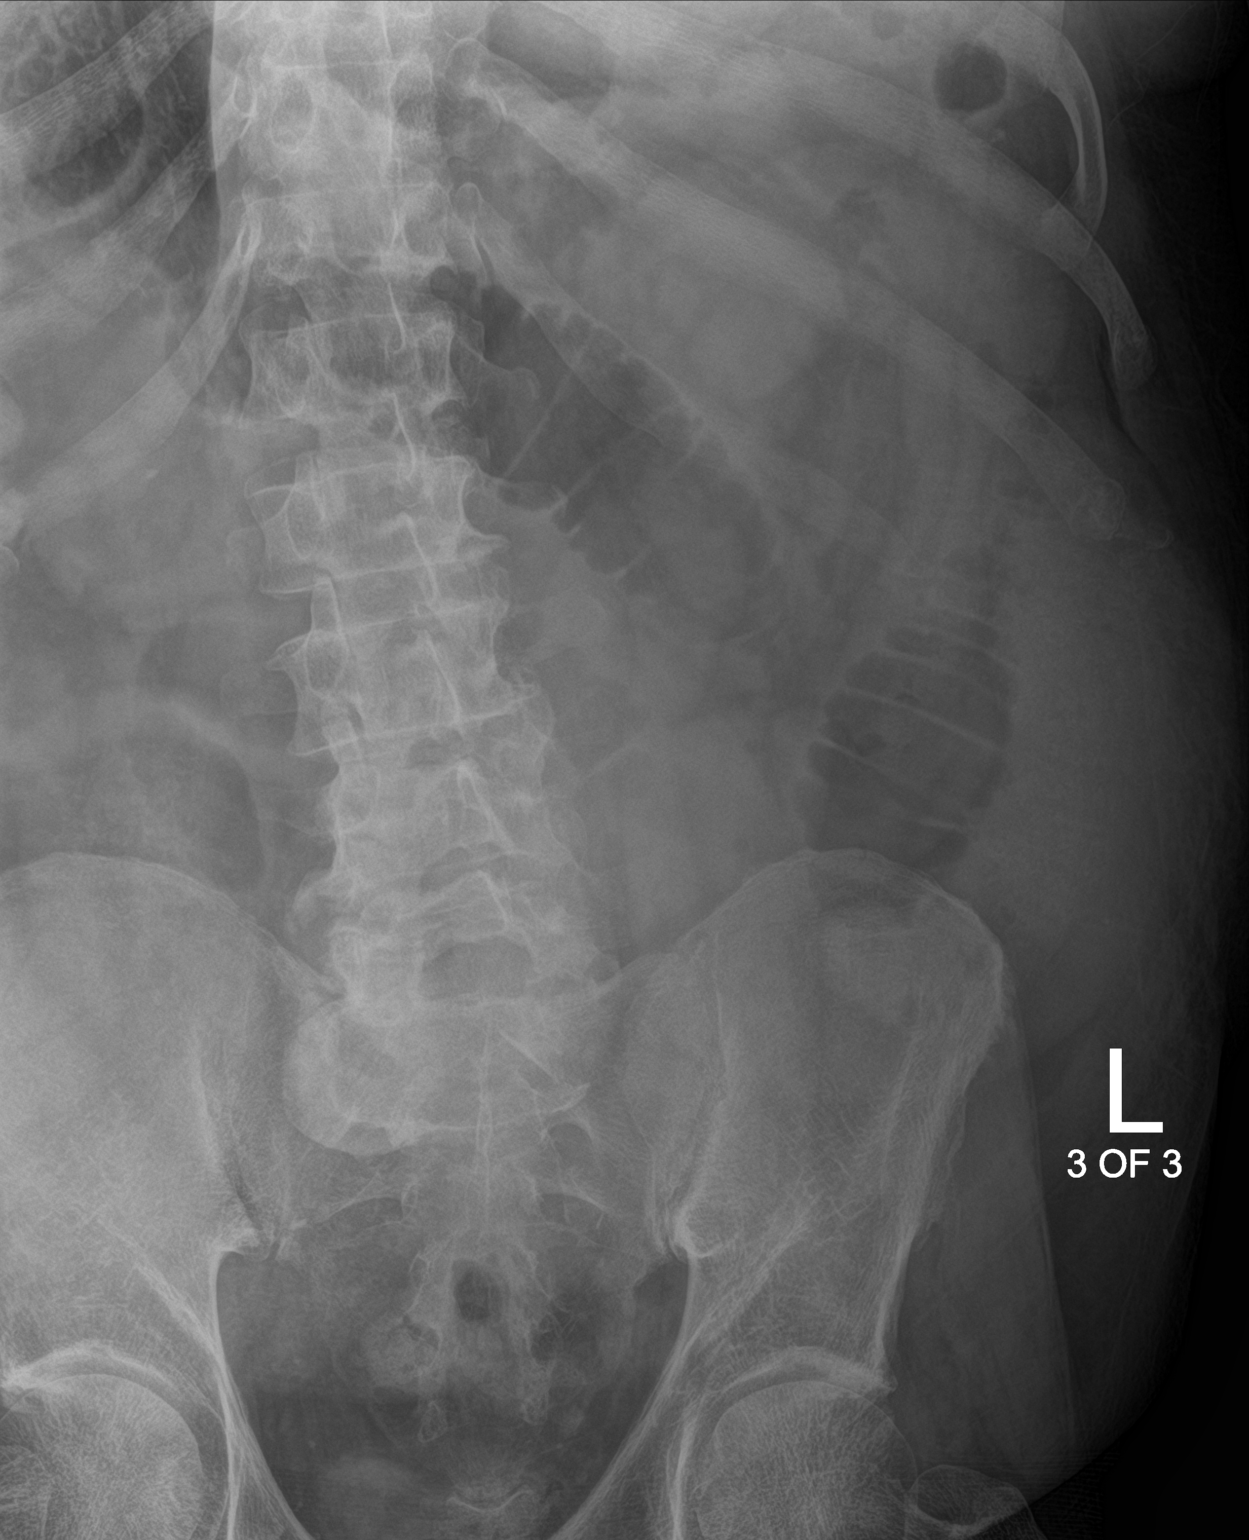

[3 of 3 positions shown; findings below may reference images not displayed]

FINDINGS: Stable small bowel dilatation is noted concerning for ileus or
possibly distal small bowel obstruction. No definite colonic
dilatation is noted. No radio-opaque calculi or other significant
radiographic abnormality are seen.
IMPRESSION: Stable small bowel dilatation is noted concerning for ileus or
possibly distal small bowel obstruction.

## 2018-09-15 MED ORDER — MAGNESIUM CITRATE PO SOLN
0.5000 | Freq: Once | ORAL | Status: AC
  Administered 2018-09-15: 0.5 via ORAL
  Filled 2018-09-15: qty 296

## 2018-09-15 MED ORDER — BISACODYL 10 MG RE SUPP
10.0000 mg | Freq: Once | RECTAL | Status: AC
  Administered 2018-09-15: 10 mg via RECTAL
  Filled 2018-09-15: qty 1

## 2018-09-15 NOTE — Care Management Important Message (Signed)
Important Message  Patient Details  Name: Jacob [name] MRN: [id number] Date of Birth: September 24, 1945   Medicare Important Message Given:  Yes     Annalicia Renfrew 09/15/2018, 1:42 PM

## 2018-09-15 NOTE — Progress Notes (Signed)
PROGRESS NOTE  Jacob [name] OIZ:[id number] DOB: 1945-09-08 DOA: 09/09/2018 PCP: Prince Solian, MD  Brief History   73 year old man PMH including stomach cancer in remission presenting with abdominal pain, nausea, hemoptysis, multifocal pneumonia.*  A & P  Multifocal pneumonia.  COVID negative. --Afebrile, vital signs stable, asymptomatic, no hypoxia. --Completed 7 days of ceftriaxone and azithromycin today --Stop antibiotics and monitor  Small bowel obstruction versus ileus. --Follow-up x-ray showed stable small bowel dilatation concerning for ileus or possible distal small bowel obstruction --Continue management per general surgery  Acute kidney injury superimposed on CKD stage III with metabolic acidosis.  Metabolic acidosis now resolved. --Overall improving with decreased trend in BUN and creatinine.  Modest associated hyperkalemia.  Repeat BMP in a.m.  GERD --Continue PPI.  Hyperkalemia --Modest, 5.2, in the setting of improving AKI --Follow clinically  PMH gastric adenocarcinoma, prostate cancer.  Status post gastrectomy 2018.  Status post prostate cancer seed implants 2013.  Marland Kitchen   Resolved Hospital Problem list       DVT prophylaxis: heparin Code Status: Full Family Communication: none Disposition Plan: home    Murray Hodgkins, MD  [facility] Direct contact: see [email or web address] (further directions at bottom of note if needed) 7PM-7AM contact night coverage as at bottom of note 09/15/2018, 2:00 PM  LOS: 6 days   Significant Hospital Events   .    Consults:  .    Procedures:  .   Significant Diagnostic Tests:  Marland Kitchen    Micro Data:  .    Antimicrobials:  .   Interval History/Subjective  Some nausea, no vomiting.  Some abdominal pain.  Breathing fine.  Objective   Vitals:  Vitals:   09/15/18 0523 09/15/18 0706  BP: (!) 178/98 (!) 152/111  Pulse: 72 87  Resp:  16  Temp:  98.2 F (36.8 C)  SpO2:  95%    Exam:  Constitutional:   . Appears calm and comfortable Respiratory:  . CTA bilaterally, no w/r/r.  . Respiratory effort normal.  Cardiovascular:  . RRR, no m/r/g . No LE extremity edema   Abdomen:  . Decreased bowel sounds, soft Psychiatric:  . Mental status o Mood, affect appropriate  I have personally reviewed the following:   Today's Data  . Potassium 5.2, stable . BUN trending down, 50 . Creatinine trending down, 2.52 . Remainder CMP unremarkable . Hemoglobin stable at 9.6.  WBC is now elevated at 14.3.   Scheduled Meds: . docusate sodium  100 mg Oral BID  . heparin  5,000 Units Subcutaneous Q8H  . labetalol  5 mg Intravenous Q6H  . pantoprazole (PROTONIX) IV  40 mg Intravenous Q24H  . polyethylene glycol  17 g Oral Daily   Continuous Infusions:   Principal Problem:   SBO (small bowel obstruction) (HCC) Active Problems:   Essential hypertension   Gastric adenocarcinoma (HCC)   Pneumonia   AKI (acute kidney injury) ([place])   CKD (chronic kidney disease), stage III ([place])   LOS: 6 days   How to contact the [facility] Attending or Consulting provider 7A - 7P or covering provider during after hours [place], for this patient?  1. Check the care team in [facility] and look for a) attending/consulting TR[facility] provider listed and b) the [facility] team listed 2. Log into [email or web address] and use [place]'s universal password to access. If you do not have the password, please contact the hospital operator. 3. Locate the [facility] provider you are looking for under [facility] and page to a number  that you can be directly reached. 4. If you still have difficulty reaching the provider, please page the [facility] (Director on Call) for the Hospitalists listed on amion for assistance.

## 2018-09-15 NOTE — Progress Notes (Signed)
[facility] Progress Note     Subjective: CC: sbo Patient feeling well this AM but reports he had an episode of bilious emesis yesterday. Felt better after vomiting but had bad abdominal pain prior to and was having to lie on his stomach to help the pain. He did have a BM yesterday but that was prior to vomiting. Patient really wants to avoid having NGT replaced but understands that he will need one if he is feeling nauseated again.   Objective: Vital signs in last 24 hours: Temp:  [97.8 F (36.6 C)-98.5 F (36.9 C)] 98.2 F (36.8 C) (09/02 0706) Pulse Rate:  [72-87] 87 (09/02 0706) Resp:  [14-17] 16 (09/02 0706) BP: (152-178)/(98-111) 152/111 (09/02 0706) SpO2:  [95 %-99 %] 95 % (09/02 0706) Last BM Date: 09/14/18  Intake/Output from previous day: 09/01 0701 - 09/02 0700 In: 810.3 [P.O.:340; I.V.:370.3; IV Piggyback:100] Out: 175 [Urine:175] Intake/Output this shift: No intake/output data recorded.  PE: Gen: Alert, NAD, pleasant Card: Regular rate and rhythm Pulm: Normal effort, nasal cannula present Abd: Soft, non-tender,moderatelydistended,hypoactive BS Skin: warm and dry, no rashes  Psych: A&Ox3  Lab Results:  Recent Labs    09/14/18 0431 09/15/18 0500  WBC 10.4 14.3*  HGB 10.1* 9.6*  HCT 30.5* 29.7*  PLT 291 259   BMET Recent Labs    09/14/18 0431 09/15/18 0500  NA 139 139  K 5.1 5.2*  CL 111 108  CO2 20* 20*  GLUCOSE 115* 102*  BUN 55* 50*  CREATININE 2.53* 2.52*  CALCIUM 9.9 9.9   PT/INR No results for input(s): LABPROT, INR in the last 72 hours. CMP     Component Value Date/Time   NA 139 09/15/2018 0500   NA 141 01/16/2017 0924   K 5.2 (H) 09/15/2018 0500   K 4.8 01/16/2017 0924   CL 108 09/15/2018 0500   CO2 20 (L) 09/15/2018 0500   CO2 25 01/16/2017 0924   GLUCOSE 102 (H) 09/15/2018 0500   GLUCOSE 80 01/16/2017 0924   BUN 50 (H) 09/15/2018 0500   BUN 24.0 01/16/2017 0924   CREATININE 2.52 (H) 09/15/2018 0500   CREATININE 2.46 (H) 04/15/2018 1158   CREATININE 1.9 (H) 01/16/2017 0924   CALCIUM 9.9 09/15/2018 0500   CALCIUM 9.8 01/16/2017 0924   PROT 6.2 (L) 09/15/2018 0500   PROT 7.1 01/16/2017 0924   ALBUMIN 2.8 (L) 09/15/2018 0500   ALBUMIN 3.2 (L) 01/16/2017 0924   AST 18 09/15/2018 0500   AST 18 04/15/2018 1158   AST 16 01/16/2017 0924   ALT 13 09/15/2018 0500   ALT 13 04/15/2018 1158   ALT 10 01/16/2017 0924   ALKPHOS 44 09/15/2018 0500   ALKPHOS 54 01/16/2017 0924   BILITOT 0.8 09/15/2018 0500   BILITOT 0.4 04/15/2018 1158   BILITOT 0.54 01/16/2017 0924   GFRNONAA 24 (L) 09/15/2018 0500   GFRNONAA 25 (L) 04/15/2018 1158   GFRAA 28 (L) 09/15/2018 0500   GFRAA 29 (L) 04/15/2018 1158   Lipase     Component Value Date/Time   LIPASE 39 09/09/2018 0805       Studies/Results: Dg Chest Port 1 View  Result Date: 09/14/2018 CLINICAL DATA:  Shortness of breath EXAM: PORTABLE CHEST 1 VIEW COMPARISON:  09/13/2018 FINDINGS: Cardiac shadow is stable. Left chest wall port is again seen. The lungs are well aerated with mild bibasilar atelectatic changes stable from the previous day. No new focal infiltrate or sizable effusion is seen. No bony abnormality is noted.  IMPRESSION: Bibasilar atelectatic changes stable from the previous day. Electronically Signed   By: Inez Catalina M.D.   On: 09/14/2018 09:01   Dg Abd Portable 1v  Result Date: 09/14/2018 CLINICAL DATA:  Abdominal pain EXAM: PORTABLE ABDOMEN - 1 VIEW COMPARISON:  09/13/2018 FINDINGS: Scattered large and small bowel gas is noted. Persistent small bowel dilatation is noted in the left mid abdomen stable from the previous exam. No free air is noted. No acute bony abnormality is noted. IMPRESSION: Persistent small bowel dilatation stable from previous exam. Electronically Signed   By: Inez Catalina M.D.   On: 09/14/2018 09:03    Anti-infectives: Anti-infectives (From admission, onward)   Start     Dose/Rate Route Frequency Ordered Stop    09/09/18 1100  cefTRIAXone (ROCEPHIN) 2 g in sodium chloride 0.9 % 100 mL IVPB     2 g 200 mL/hr over 30 Minutes Intravenous Every 24 hours 09/09/18 1051     09/09/18 1100  azithromycin (ZITHROMAX) 500 mg in sodium chloride 0.9 % 250 mL IVPB     500 mg 250 mL/hr over 60 Minutes Intravenous Every 24 hours 09/09/18 1051         Assessment/Plan Hx of gastric cancer - s/p distal gastrectomy in 2018 Dr. Barry Dienes Hx of prostate cancer - s/p radiation AKI on CKD -improving, IVF per primary  Constipation BPH GERD HTN ?COPD - 2ppd smoking hx  Multifocal pneumonia- seen on CXR and CT, COVID negative, perprimaryservice  SBO vs ileus - CT with sbo with transition in mid ileal region - can leave NGT out for now,but if patient becomes nauseated or vomits would replace NGT  -filmthis AMwith dilated loops and stool in R colon  -miralax and colace for bowel regimen, repeat suppository today and add magnesium citrate -MOBILIZE!!!! - patient with emesis yesterday, concerned that this may not be resolving with conservative management   FEN:CLD,IVF VTE: SCDs,SQ heparin ID: azithromycin/rocephin  LOS: 6 days    Brigid Re , [facility] Surgery 09/15/2018, 7:41 AM Pager: [phone] Consults: [phone] 7:00 AM - 4:30 PM M, W-F 7:00 AM - 11:30 AM Tues, Sat, Sun

## 2018-09-16 ENCOUNTER — Inpatient Hospital Stay (HOSPITAL_COMMUNITY): Payer: Medicare Other

## 2018-09-16 LAB — CBC
HCT: 29.1 % — ABNORMAL LOW (ref 39.0–52.0)
Hemoglobin: 9.4 g/dL — ABNORMAL LOW (ref 13.0–17.0)
MCH: 31.5 pg (ref 26.0–34.0)
MCHC: 32.3 g/dL (ref 30.0–36.0)
MCV: 97.7 fL (ref 80.0–100.0)
Platelets: 250 10*3/uL (ref 150–400)
RBC: 2.98 MIL/uL — ABNORMAL LOW (ref 4.22–5.81)
RDW: 15.3 % (ref 11.5–15.5)
WBC: 11.4 10*3/uL — ABNORMAL HIGH (ref 4.0–10.5)
nRBC: 0 % (ref 0.0–0.2)

## 2018-09-16 LAB — BASIC METABOLIC PANEL
Anion gap: 11 (ref 5–15)
BUN: 43 mg/dL — ABNORMAL HIGH (ref 8–23)
CO2: 21 mmol/L — ABNORMAL LOW (ref 22–32)
Calcium: 9.5 mg/dL (ref 8.9–10.3)
Chloride: 105 mmol/L (ref 98–111)
Creatinine, Ser: 2.61 mg/dL — ABNORMAL HIGH (ref 0.61–1.24)
GFR calc Af Amer: 27 mL/min — ABNORMAL LOW (ref >60)
GFR calc non Af Amer: 23 mL/min — ABNORMAL LOW (ref >60)
Glucose, Bld: 80 mg/dL (ref 70–99)
Potassium: 4.4 mmol/L (ref 3.5–5.1)
Sodium: 137 mmol/L (ref 135–145)

## 2018-09-16 IMAGING — DX DG ABD PORTABLE 1V
1 series · 2 of 2 positions shown · non-contrast
Comparison: [DATE]

CLINICAL DATA: Small bowel obstruction.

EXAM:
PORTABLE ABDOMEN - 1 VIEW

[Series 1: abdomen · 0.14mm/px · 2 of 2 slices shown]
[im 1/2]
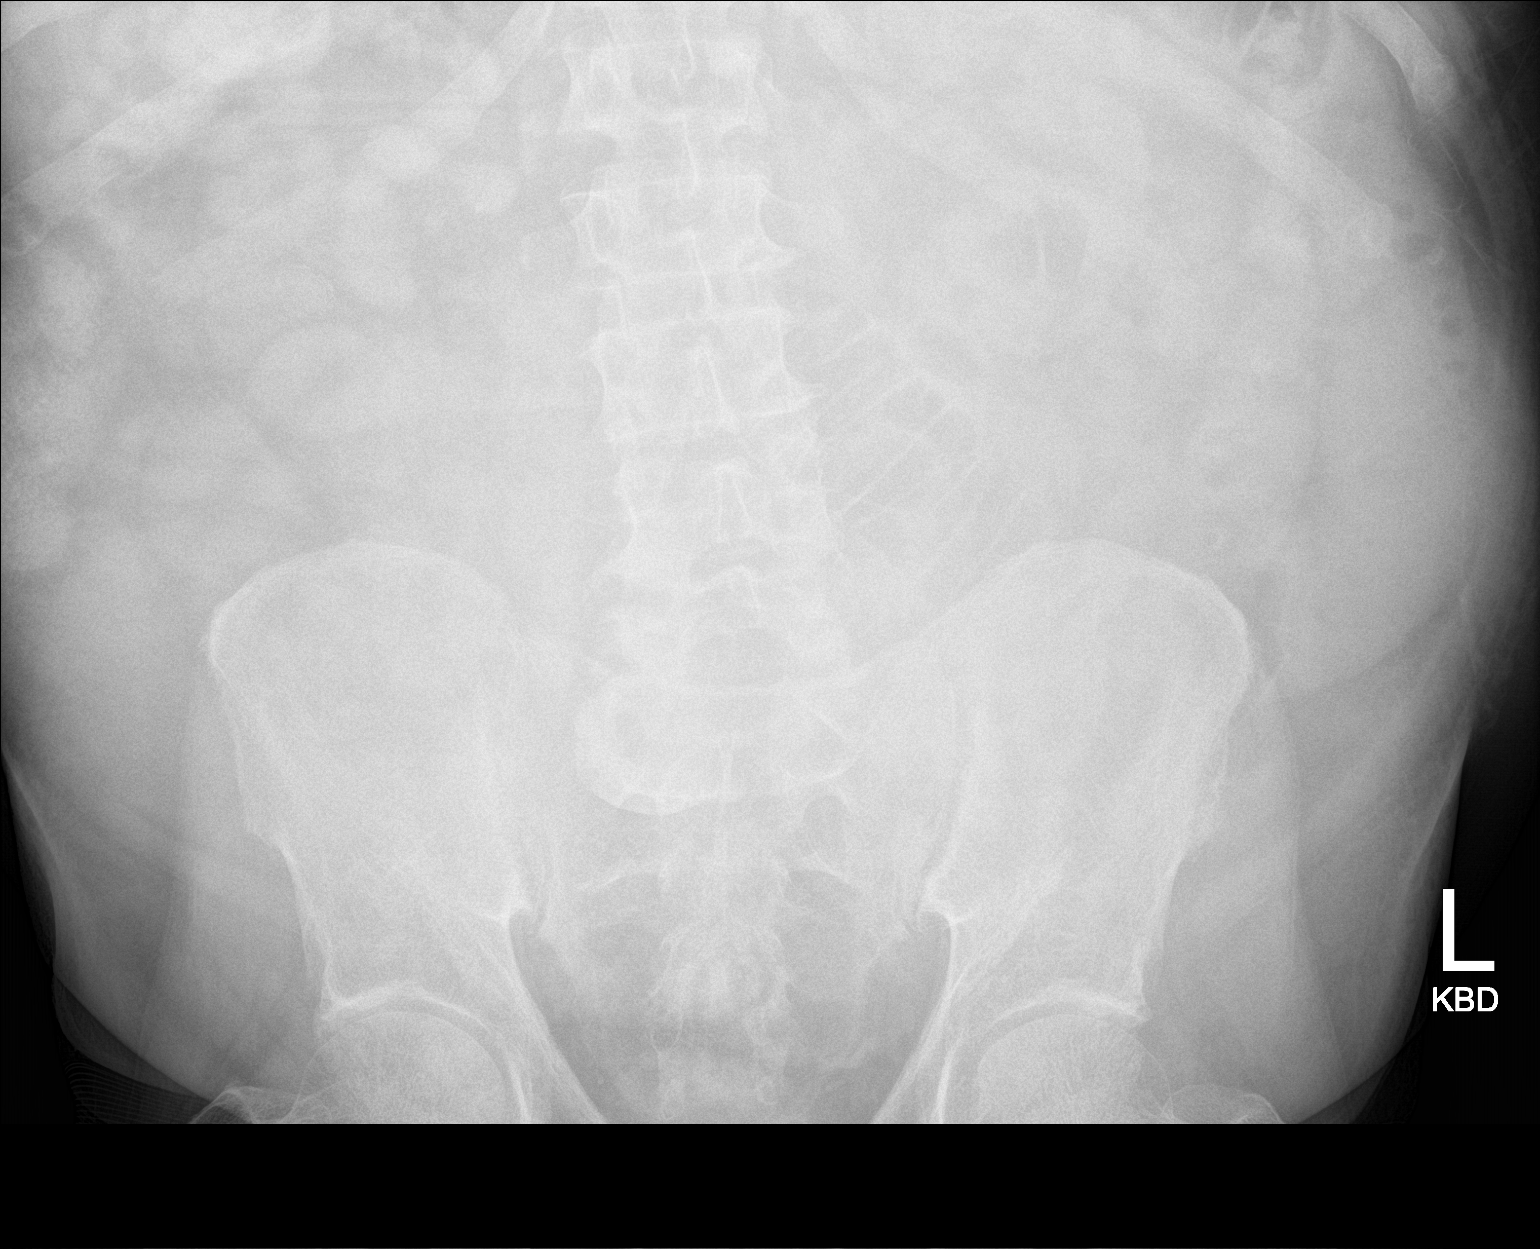
[im 2/2]
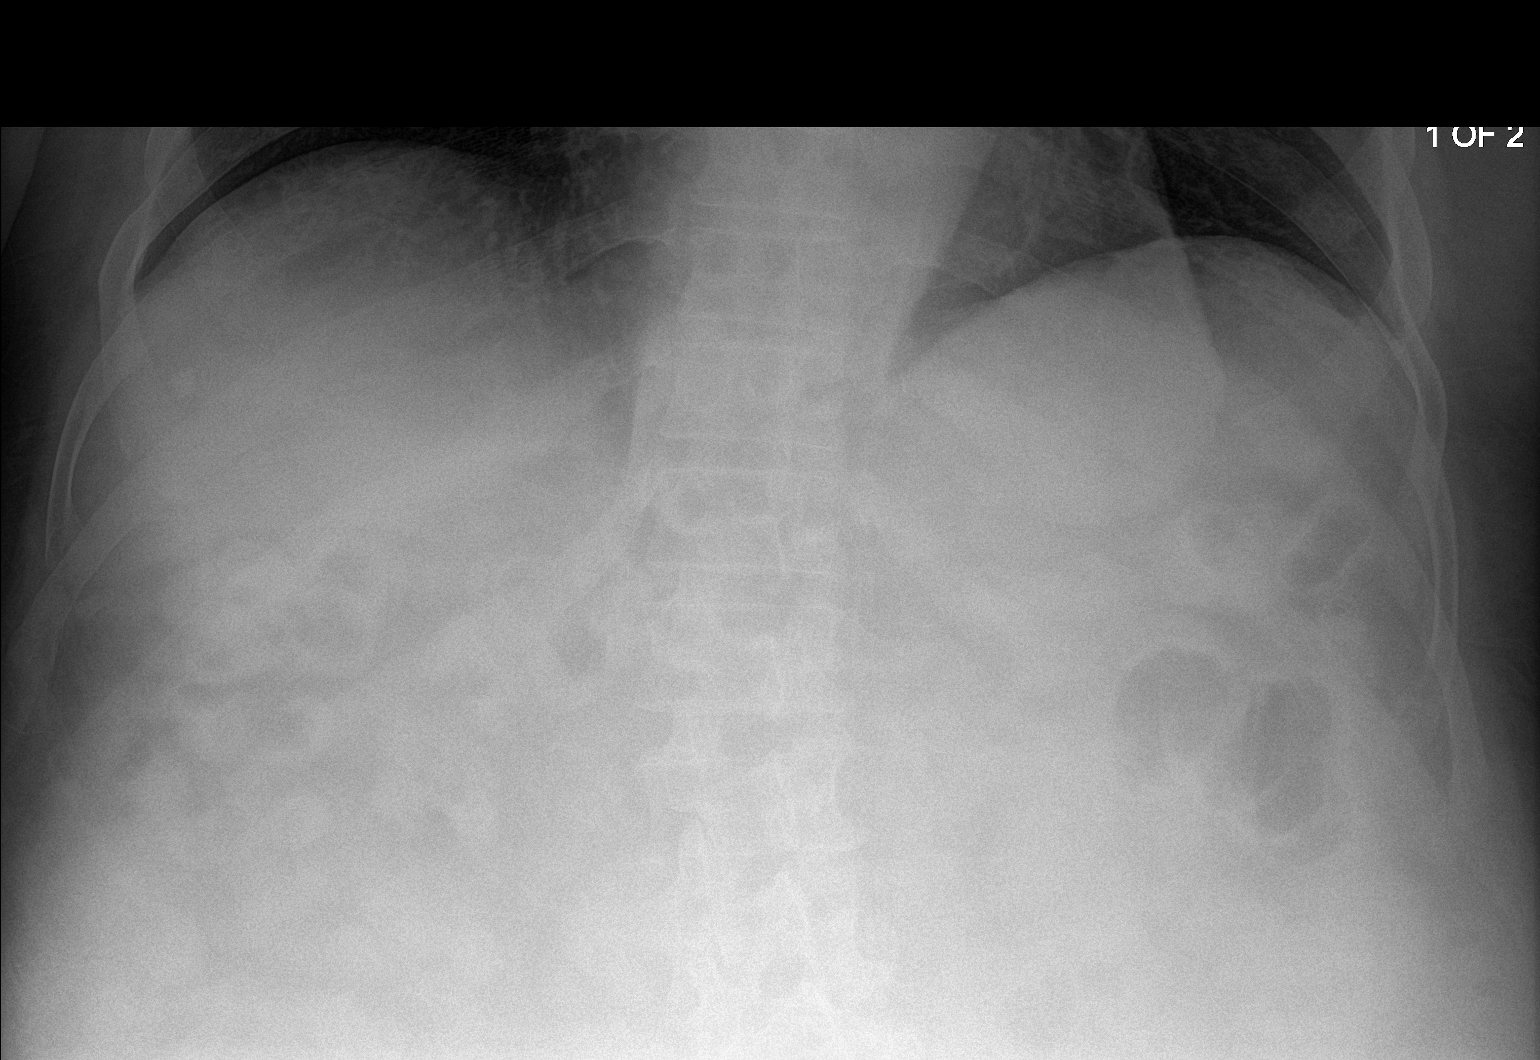

[2 of 2 positions shown; findings below may reference images not displayed]

FINDINGS: Dilated loop of small bowel within the left lower quadrant of
abdomen is improved from previous exam now measuring 4.3 cm versus
5.5 cm previously. Adjacent dilated small bowel loop has resolved in
the interval. No new findings. No pneumoperitoneum identified.
IMPRESSION: 1. Mild improvement in dilated small bowel loop within the left
lower quadrant of the abdomen suggesting improving obstruction.

## 2018-09-16 MED ORDER — POLYETHYLENE GLYCOL 3350 17 G PO PACK
17.0000 g | PACK | Freq: Two times a day (BID) | ORAL | Status: DC
  Administered 2018-09-16 – 2018-09-19 (×7): 17 g via ORAL
  Filled 2018-09-16 (×7): qty 1

## 2018-09-16 MED ORDER — PANTOPRAZOLE SODIUM 40 MG PO TBEC
40.0000 mg | DELAYED_RELEASE_TABLET | Freq: Every day | ORAL | Status: DC
  Administered 2018-09-16 – 2018-09-18 (×3): 40 mg via ORAL
  Filled 2018-09-16 (×3): qty 1

## 2018-09-16 NOTE — Progress Notes (Signed)
[facility] Progress Note     Subjective: CC: sbo Patient had a large BM after mag citrate yesterday and has felt better since this. Patient reports no further nausea or emesis, although RN reported some nausea after mag citrate to MD yesterday evening. Denies abdominal pain this AM.   Objective: Vital signs in last 24 hours: Temp:  [97.9 F (36.6 C)-98.3 F (36.8 C)] 98.3 F (36.8 C) (09/03 0805) Pulse Rate:  [68-73] 68 (09/03 0807) Resp:  [16-19] 19 (09/03 0805) BP: (155-175)/(90-108) 164/96 (09/03 0807) SpO2:  [96 %-100 %] 96 % (09/03 0807) Last BM Date: 09/15/18  Intake/Output from previous day: 09/02 0701 - 09/03 0700 In: 250 [P.O.:250] Out: 250 [Urine:250] Intake/Output this shift: No intake/output data recorded.  PE: Gen: Alert, NAD, pleasant Card: Regular rate and rhythm Pulm: Normal effort, lungs CTAB Abd: Soft, non-tender,non-distended,+BS Skin: warm and dry, no rashes  Psych: A&Ox3  Lab Results:  Recent Labs    09/15/18 0500 09/16/18 0520  WBC 14.3* 11.4*  HGB 9.6* 9.4*  HCT 29.7* 29.1*  PLT 259 250   BMET Recent Labs    09/15/18 0500 09/16/18 0520  NA 139 137  K 5.2* 4.4  CL 108 105  CO2 20* 21*  GLUCOSE 102* 80  BUN 50* 43*  CREATININE 2.52* 2.61*  CALCIUM 9.9 9.5   PT/INR No results for input(s): LABPROT, INR in the last 72 hours. CMP     Component Value Date/Time   NA 137 09/16/2018 0520   NA 141 01/16/2017 0924   K 4.4 09/16/2018 0520   K 4.8 01/16/2017 0924   CL 105 09/16/2018 0520   CO2 21 (L) 09/16/2018 0520   CO2 25 01/16/2017 0924   GLUCOSE 80 09/16/2018 0520   GLUCOSE 80 01/16/2017 0924   BUN 43 (H) 09/16/2018 0520   BUN 24.0 01/16/2017 0924   CREATININE 2.61 (H) 09/16/2018 0520   CREATININE 2.46 (H) 04/15/2018 1158   CREATININE 1.9 (H) 01/16/2017 0924   CALCIUM 9.5 09/16/2018 0520   CALCIUM 9.8 01/16/2017 0924   PROT 6.2 (L) 09/15/2018 0500   PROT 7.1 01/16/2017 0924   ALBUMIN 2.8 (L)  09/15/2018 0500   ALBUMIN 3.2 (L) 01/16/2017 0924   AST 18 09/15/2018 0500   AST 18 04/15/2018 1158   AST 16 01/16/2017 0924   ALT 13 09/15/2018 0500   ALT 13 04/15/2018 1158   ALT 10 01/16/2017 0924   ALKPHOS 44 09/15/2018 0500   ALKPHOS 54 01/16/2017 0924   BILITOT 0.8 09/15/2018 0500   BILITOT 0.4 04/15/2018 1158   BILITOT 0.54 01/16/2017 0924   GFRNONAA 23 (L) 09/16/2018 0520   GFRNONAA 25 (L) 04/15/2018 1158   GFRAA 27 (L) 09/16/2018 0520   GFRAA 29 (L) 04/15/2018 1158   Lipase     Component Value Date/Time   LIPASE 39 09/09/2018 0805       Studies/Results: Dg Abd 1 View  Result Date: 09/15/2018 CLINICAL DATA:  Abdominal pain. EXAM: ABDOMEN - 1 VIEW COMPARISON:  Radiograph of September 14, 2018. FINDINGS: Stable small bowel dilatation is noted concerning for ileus or possibly distal small bowel obstruction. No definite colonic dilatation is noted. No radio-opaque calculi or other significant radiographic abnormality are seen. IMPRESSION: Stable small bowel dilatation is noted concerning for ileus or possibly distal small bowel obstruction. Electronically Signed   By: Marijo Conception M.D.   On: 09/15/2018 09:22    Anti-infectives: Anti-infectives (From admission, onward)   Start  Dose/Rate Route Frequency Ordered Stop   09/09/18 1100  cefTRIAXone (ROCEPHIN) 2 g in sodium chloride 0.9 % 100 mL IVPB  Status:  Discontinued     2 g 200 mL/hr over 30 Minutes Intravenous Every 24 hours 09/09/18 1051 09/15/18 1357   09/09/18 1100  azithromycin (ZITHROMAX) 500 mg in sodium chloride 0.9 % 250 mL IVPB  Status:  Discontinued     500 mg 250 mL/hr over 60 Minutes Intravenous Every 24 hours 09/09/18 1051 09/15/18 1357       Assessment/Plan Hx of gastric cancer - s/p distal gastrectomy in 2018 Dr. Barry Dienes Hx of prostate cancer - s/p radiation AKI on CKD -per primary Constipation BPH GERD HTN ?COPD - 2ppd smoking hx  Multifocal pneumonia- seen on CXR and CT, COVID  negative, perprimaryservice  SBO vs ileus - CT with sbo with transition in mid ileal region - if patient becomes nauseated or vomits would replace NGT  -had a large BM yesterday after mag citrate - film from this AM pending   FEN:FLD VTE: SCDs,SQ heparin ID: azithromycin/rocephin  LOS: 7 days    Brigid Re , [facility] Surgery 09/16/2018, 8:33 AM Pager: [phone] Consults: [phone] 7:00 AM - 4:30 PM M, W-F 7:00 AM - 11:30 AM Tues, Sat, Sun

## 2018-09-16 NOTE — Progress Notes (Signed)
Charge nurse rounding, patient asleep, respirations easy

## 2018-09-16 NOTE — Progress Notes (Addendum)
PROGRESS NOTE  Jacob [name] NAT:[id number] DOB: 1945-04-25 DOA: 09/09/2018 PCP: Prince Solian, MD  HPI/Recap of past 79 hours: 73 year old man PMH including stomach cancer in remission presenting with abdominal pain, nausea, hemoptysis, multifocal pneumonia.  09/16/18: Patient was seen and examined at his bedside this morning.  Reports abdominal pain 7 out of 10.  Positive flatus and positive bowel movement earlier this morning.  Mild improvement in dilated small bowel loops within the left lower quadrant of the abdomen suggesting improving obstruction.  General surgery advancing diet to full liquid.  Assessment/Plan: Principal Problem:   SBO (small bowel obstruction) (HCC) Active Problems:   Essential hypertension   Gastric adenocarcinoma (HCC)   Pneumonia   AKI (acute kidney injury) ([place])   CKD (chronic kidney disease), stage III (HCC)  Small bowel obstruction versus ileus Symptomatology and imaging improving Continue to mobilize General surgery advancing diet to full liquid Management per surgery  Multifocal community-acquired pneumonia Completed 7 days of Rocephin and azithromycin on 09/15/2018 Afebrile, leukocytosis is resolving  Uncontrolled hypertension  Blood pressures not at goal Continue labetalol IV 5 mg every 6 hours, if no improvement will increase dose to 10 mg 3 times daily. Resume home medications once ileus has resolved  AKI on CKD 4 Presented with creatinine of 2.89 Baseline creatinine appears to be 2.5 with GFR of 28 Creatinine today 2.61 Continue to avoid nephrotoxins Monitor urine output Repeat BMP in the morning  Mild metabolic acidosis, likely secondary to acute renal failure Chemistry bicarb 21 from 20 Continues to improve  Anemia of chronic disease in the setting of CKD 4 Hemoglobin is stable at 9.2 No sign of overt bleeding Continue to monitor  Resolved hyperkalemia K+ 5.2 >> 4.4   DVT prophylaxis: SQ heparin TID Code Status:  Full Family Communication: none Disposition Plan: home in 1-2 days or when surgery signs off.    Objective: Vitals:   09/15/18 1642 09/15/18 2327 09/16/18 0805 09/16/18 0807  BP: (!) 158/108 (!) 155/90 (!) 175/93 (!) 164/96  Pulse: 73 70 72 68  Resp: 16 17 19    Temp: 97.9 F (36.6 C) 98.1 F (36.7 C) 98.3 F (36.8 C)   TempSrc: Oral Oral Oral   SpO2: 100% 100% 99% 96%  Weight:      Height:        Intake/Output Summary (Last 24 hours) at 09/16/2018 1528 Last data filed at 09/15/2018 1700 Gross per 24 hour  Intake -  Output 250 ml  Net -250 ml   Filed Weights   09/09/18 0737  Weight: 108.9 kg    Exam:  . General: 73 y.o. year-old male well developed well nourished in no acute distress.  Alert and oriented x3. . Cardiovascular: Regular rate and rhythm with no rubs or gallops.  No thyromegaly or JVD noted.   Marland Kitchen Respiratory: Clear to auscultation with no wheezes or rales. Good inspiratory effort. . Abdomen: Mildly distended with hypoactive bowel sounds. . Musculoskeletal: Trace lower extremity edema. 2/4 pulses in all 4 extremities. Marland Kitchen Psychiatry: Mood is appropriate for condition and setting   Data Reviewed: CBC: Recent Labs  Lab 09/11/18 0530 09/12/18 0714 09/13/18 0641 09/14/18 0431 09/15/18 0500 09/16/18 0520  WBC 12.7* 10.3 10.5 10.4 14.3* 11.4*  NEUTROABS 10.3* 7.9* 7.8* 8.6* 12.4*  --   HGB 9.8* 10.3* 10.1* 10.1* 9.6* 9.4*  HCT 28.7* 30.1* 30.4* 30.5* 29.7* 29.1*  MCV 93.8 94.4 94.4 96.2 98.0 97.7  PLT 261 283 300 291 259 427   Basic Metabolic  Panel: Recent Labs  Lab 09/11/18 0530 09/12/18 0714 09/12/18 1027 09/13/18 0641 09/14/18 0431 09/15/18 0500 09/16/18 0520  NA 139 140 142 140 139 139 137  K 5.0 5.9* 5.1 4.9 5.1 5.2* 4.4  CL 108 108 111 113* 111 108 105  CO2 21* 19* 19* 18* 20* 20* 21*  GLUCOSE 106* 94 105* 90 115* 102* 80  BUN 79* 80* 77* 70* 55* 50* 43*  CREATININE 3.19* 3.22* 3.11* 2.89* 2.53* 2.52* 2.61*  CALCIUM 9.8 10.0 9.9 10.0  9.9 9.9 9.5  MG 2.6* 2.7*  --  2.5* 2.4 2.3  --   PHOS 4.2 4.5  --  4.5 4.2 4.0  --    GFR: Estimated Creatinine Clearance: 33.6 mL/min (A) (by C-G formula based on SCr of 2.61 mg/dL (H)). Liver Function Tests: Recent Labs  Lab 09/11/18 0530 09/12/18 0714 09/13/18 0641 09/14/18 0431 09/15/18 0500  AST 13* 24 19 19 18   ALT 11 11 12 12 13   ALKPHOS 57 59 46 47 44  BILITOT 0.7 1.3* 0.9 0.8 0.8  PROT 6.4* 6.3* 6.6 6.7 6.2*  ALBUMIN 2.5* 2.7* 2.8* 2.9* 2.8*   No results for input(s): LIPASE, AMYLASE in the last 168 hours. No results for input(s): AMMONIA in the last 168 hours. Coagulation Profile: No results for input(s): INR, PROTIME in the last 168 hours. Cardiac Enzymes: No results for input(s): CKTOTAL, CKMB, CKMBINDEX, TROPONINI in the last 168 hours. BNP (last 3 results) No results for input(s): PROBNP in the last 8760 hours. HbA1C: No results for input(s): HGBA1C in the last 72 hours. CBG: No results for input(s): GLUCAP in the last 168 hours. Lipid Profile: No results for input(s): CHOL, HDL, LDLCALC, TRIG, CHOLHDL, LDLDIRECT in the last 72 hours. Thyroid Function Tests: No results for input(s): TSH, T4TOTAL, FREET4, T3FREE, THYROIDAB in the last 72 hours. Anemia Panel: No results for input(s): VITAMINB12, FOLATE, FERRITIN, TIBC, IRON, RETICCTPCT in the last 72 hours. Urine analysis:    Component Value Date/Time   COLORURINE YELLOW 09/09/2018 0910   APPEARANCEUR HAZY (A) 09/09/2018 0910   LABSPEC 1.016 09/09/2018 0910   PHURINE 5.0 09/09/2018 0910   GLUCOSEU NEGATIVE 09/09/2018 0910   HGBUR NEGATIVE 09/09/2018 0910   BILIRUBINUR NEGATIVE 09/09/2018 0910   KETONESUR 5 (A) 09/09/2018 0910   PROTEINUR 30 (A) 09/09/2018 0910   NITRITE NEGATIVE 09/09/2018 0910   LEUKOCYTESUR LARGE (A) 09/09/2018 0910   Sepsis Labs: @LABRCNTIP (procalcitonin:4,lacticidven:4)  ) Recent Results (from the past 240 hour(s))  SARS Coronavirus 2 [facility] order, Performed in [facility] hospital lab) Nasopharyngeal Nasopharyngeal Swab     Status: None   Collection Time: 09/09/18  8:10 AM   Specimen: Nasopharyngeal Swab  Result Value Ref Range Status   SARS Coronavirus 2 NEGATIVE NEGATIVE Final    Comment: (NOTE) If result is NEGATIVE SARS-CoV-2 target nucleic acids are NOT DETECTED. The SARS-CoV-2 RNA is generally detectable in upper and lower  respiratory specimens during the acute phase of infection. The lowest  concentration of SARS-CoV-2 viral copies this assay can detect is 250  copies / mL. A negative result does not preclude SARS-CoV-2 infection  and should not be used as the sole basis for treatment or other  patient management decisions.  A negative result may occur with  improper specimen collection / handling, submission of specimen other  than nasopharyngeal swab, presence of viral mutation(s) within the  areas targeted by this assay, and inadequate number of viral copies  (<250 copies / mL). A negative  result must be combined with clinical  observations, patient history, and epidemiological information. If result is POSITIVE SARS-CoV-2 target nucleic acids are DETECTED. The SARS-CoV-2 RNA is generally detectable in upper and lower  respiratory specimens dur ing the acute phase of infection.  Positive  results are indicative of active infection with SARS-CoV-2.  Clinical  correlation with patient history and other diagnostic information is  necessary to determine patient infection status.  Positive results do  not rule out bacterial infection or co-infection with other viruses. If result is PRESUMPTIVE POSTIVE SARS-CoV-2 nucleic acids MAY BE PRESENT.   A presumptive positive result was obtained on the submitted specimen  and confirmed on repeat testing.  While 2019 novel coronavirus  (SARS-CoV-2) nucleic acids may be present in the submitted sample  additional confirmatory testing may be necessary for epidemiological  and / or clinical management  purposes  to differentiate between  SARS-CoV-2 and other Sarbecovirus currently known to infect humans.  If clinically indicated additional testing with an alternate test  methodology [phone]) is advised. The SARS-CoV-2 RNA is generally  detectable in upper and lower respiratory sp ecimens during the acute  phase of infection. The expected result is Negative. Fact Sheet for Patients:  StrictlyIdeas.no Fact Sheet for Healthcare Providers: BankingDealers.co.za This test is not yet approved or cleared by the [place] FDA and has been authorized for detection and/or diagnosis of SARS-CoV-2 by FDA under an Emergency Use Authorization (EUA).  This EUA will remain in effect (meaning this test can be used) for the duration of the COVID-19 declaration under Section 564(b)(1) of the Act, 21 U.S.C. section 360bbb-3(b)(1), unless the authorization is terminated or revoked sooner. Performed at [facility], [place] [address], [place], [place] [address]   Urine culture     Status: Abnormal   Collection Time: 09/09/18 10:03 AM   Specimen: Urine, Clean Catch  Result Value Ref Range Status   Specimen Description URINE, CLEAN CATCH  Final   Special Requests   Final    NONE Performed at [facility], [place] [address], [place], [place] [address]    Culture 20,000 COLONIES/mL PROTEUS MIRABILIS (A)  Final   Report Status 09/11/2018 FINAL  Final   Organism ID, Bacteria PROTEUS MIRABILIS (A)  Final      Susceptibility   Proteus mirabilis - MIC*    AMPICILLIN <=2 SENSITIVE Sensitive     CEFAZOLIN <=4 SENSITIVE Sensitive     CEFTRIAXONE <=1 SENSITIVE Sensitive     CIPROFLOXACIN <=0.25 SENSITIVE Sensitive     GENTAMICIN <=1 SENSITIVE Sensitive     IMIPENEM 2 SENSITIVE Sensitive     NITROFURANTOIN 128 RESISTANT Resistant     TRIMETH/SULFA <=20 SENSITIVE Sensitive     AMPICILLIN/SULBACTAM <=2 SENSITIVE Sensitive     PIP/TAZO <=4 SENSITIVE  Sensitive     * 20,000 COLONIES/mL PROTEUS MIRABILIS  Blood Culture (routine x 2)     Status: None   Collection Time: 09/09/18 11:20 AM   Specimen: BLOOD  Result Value Ref Range Status   Specimen Description BLOOD LEFT ANTECUBITAL  Final   Special Requests   Final    BOTTLES DRAWN AEROBIC AND ANAEROBIC Blood Culture adequate volume   Culture   Final    NO GROWTH 5 DAYS Performed at [facility], [address], [place], [place] [address]    Report Status 09/14/2018 FINAL  Final  Blood Culture (routine x 2)     Status: None   Collection Time: 09/09/18  11:22 AM   Specimen: BLOOD LEFT HAND  Result Value Ref Range Status   Specimen Description BLOOD LEFT HAND  Final   Special Requests   Final    BOTTLES DRAWN AEROBIC ONLY Blood Culture results may not be optimal due to an inadequate volume of blood received in culture bottles   Culture   Final    NO GROWTH 5 DAYS Performed at [facility], [place] [address], [place], [place] [address]    Report Status 09/14/2018 FINAL  Final      Studies: Dg Abd Portable 1v  Result Date: 09/16/2018 CLINICAL DATA:  Small bowel obstruction. EXAM: PORTABLE ABDOMEN - 1 VIEW COMPARISON:  09/15/2018 FINDINGS: Dilated loop of small bowel within the left lower quadrant of abdomen is improved from previous exam now measuring 4.3 cm versus 5.5 cm previously. Adjacent dilated small bowel loop has resolved in the interval. No new findings. No pneumoperitoneum identified. IMPRESSION: 1. Mild improvement in dilated small bowel loop within the left lower quadrant of the abdomen suggesting improving obstruction. Electronically Signed   By: Kerby Moors M.D.   On: 09/16/2018 08:48    Scheduled Meds: . docusate sodium  100 mg Oral BID  . heparin  5,000 Units Subcutaneous Q8H  . labetalol  5 mg Intravenous Q6H  . pantoprazole  40 mg Oral QAC supper  . polyethylene glycol  17 g Oral BID    Continuous Infusions:   LOS: 7 days     Kayleen Memos,  MD [facility] Pager [phone]  If 7PM-7AM, please contact night-coverage [email or web address] Password [email or web address] 09/16/2018, 3:28 PM

## 2018-09-17 LAB — BASIC METABOLIC PANEL
Anion gap: 9 (ref 5–15)
BUN: 36 mg/dL — ABNORMAL HIGH (ref 8–23)
CO2: 21 mmol/L — ABNORMAL LOW (ref 22–32)
Calcium: 9.8 mg/dL (ref 8.9–10.3)
Chloride: 105 mmol/L (ref 98–111)
Creatinine, Ser: 2.49 mg/dL — ABNORMAL HIGH (ref 0.61–1.24)
GFR calc Af Amer: 29 mL/min — ABNORMAL LOW (ref >60)
GFR calc non Af Amer: 25 mL/min — ABNORMAL LOW (ref >60)
Glucose, Bld: 95 mg/dL (ref 70–99)
Potassium: 4.4 mmol/L (ref 3.5–5.1)
Sodium: 135 mmol/L (ref 135–145)

## 2018-09-17 MED ORDER — TRAZODONE HCL 50 MG PO TABS
50.0000 mg | ORAL_TABLET | Freq: Once | ORAL | Status: AC
  Administered 2018-09-17: 50 mg via ORAL
  Filled 2018-09-17: qty 1

## 2018-09-17 MED ORDER — MUPIROCIN 2 % EX OINT
1.0000 "application " | TOPICAL_OINTMENT | Freq: Two times a day (BID) | CUTANEOUS | Status: DC
  Administered 2018-09-18 – 2018-09-19 (×3): 1 via NASAL
  Filled 2018-09-17: qty 22

## 2018-09-17 MED ORDER — LABETALOL HCL 5 MG/ML IV SOLN
10.0000 mg | Freq: Three times a day (TID) | INTRAVENOUS | Status: DC
  Administered 2018-09-17 (×2): 10 mg via INTRAVENOUS
  Filled 2018-09-17: qty 4

## 2018-09-17 NOTE — TOC Progression Note (Signed)
Transition of Care [facility]) - Progression Note    Patient Details  Name: Jacob [name] MRN: [id number] Date of Birth: 05/08/1945  [facility] CM/SW Contact  Aadam Zhen, Abelino Derrick, RN Phone Number: 09/17/2018, 2:37 PM  Clinical Narrative:   PTA independent from home with spouse - pt also informed CM that his children stay close by and come by the home each day.  Pt has PCP and denied barriers with paying for medications.  Per staff and attending pt has been independently mobilizing on unit.  TOC will continue to follow for needs at discharge.      Expected Discharge Plan: [place] Barriers to Discharge: Continued Medical Work up  Expected Discharge Plan and Services Expected Discharge Plan: [place] arrangements for the past 2 months: Single Family Home                                       Social Determinants of Health (SDOH) Interventions    Readmission Risk Interventions Readmission Risk Prevention Plan 09/17/2018  Transportation Screening Complete  Palliative Care Screening Not Applicable  Medication Review (RN Care Manager) Complete  Some recent data might be hidden

## 2018-09-17 NOTE — Plan of Care (Signed)
  Problem: Education: Goal: Knowledge of General Education information will improve Description Including pain rating scale, medication(s)/side effects and non-pharmacologic comfort measures Outcome: Progressing   

## 2018-09-17 NOTE — Progress Notes (Signed)
[facility] Progress Note     Subjective: CC: sbo Patient denies abdominal pain this AM. Tolerating FLD and having bowel movements. Passing more flatus as well. Denies nausea or emesis.   Objective: Vital signs in last 24 hours: Temp:  [97.7 F (36.5 C)-98.4 F (36.9 C)] 97.7 F (36.5 C) (09/04 0731) Pulse Rate:  [72-78] 72 (09/04 0731) Resp:  [17-18] 17 (09/04 0731) BP: (157-163)/(84-97) 157/95 (09/04 0731) SpO2:  [95 %-97 %] 97 % (09/04 0731) Last BM Date: 09/15/18  Intake/Output from previous day: No intake/output data recorded. Intake/Output this shift: No intake/output data recorded.  PE: Gen: Alert, NAD, pleasant Card: Regular rate and rhythm Pulm: Normal effort, lungs CTAB Abd: Soft, non-tender,non-distended,+BS Skin: warm and dry, no rashes  Psych: A&Ox3  Lab Results:  Recent Labs    09/15/18 0500 09/16/18 0520  WBC 14.3* 11.4*  HGB 9.6* 9.4*  HCT 29.7* 29.1*  PLT 259 250   BMET Recent Labs    09/16/18 0520 09/17/18 0455  NA 137 135  K 4.4 4.4  CL 105 105  CO2 21* 21*  GLUCOSE 80 95  BUN 43* 36*  CREATININE 2.61* 2.49*  CALCIUM 9.5 9.8   PT/INR No results for input(s): LABPROT, INR in the last 72 hours. CMP     Component Value Date/Time   NA 135 09/17/2018 0455   NA 141 01/16/2017 0924   K 4.4 09/17/2018 0455   K 4.8 01/16/2017 0924   CL 105 09/17/2018 0455   CO2 21 (L) 09/17/2018 0455   CO2 25 01/16/2017 0924   GLUCOSE 95 09/17/2018 0455   GLUCOSE 80 01/16/2017 0924   BUN 36 (H) 09/17/2018 0455   BUN 24.0 01/16/2017 0924   CREATININE 2.49 (H) 09/17/2018 0455   CREATININE 2.46 (H) 04/15/2018 1158   CREATININE 1.9 (H) 01/16/2017 0924   CALCIUM 9.8 09/17/2018 0455   CALCIUM 9.8 01/16/2017 0924   PROT 6.2 (L) 09/15/2018 0500   PROT 7.1 01/16/2017 0924   ALBUMIN 2.8 (L) 09/15/2018 0500   ALBUMIN 3.2 (L) 01/16/2017 0924   AST 18 09/15/2018 0500   AST 18 04/15/2018 1158   AST 16 01/16/2017 0924   ALT 13  09/15/2018 0500   ALT 13 04/15/2018 1158   ALT 10 01/16/2017 0924   ALKPHOS 44 09/15/2018 0500   ALKPHOS 54 01/16/2017 0924   BILITOT 0.8 09/15/2018 0500   BILITOT 0.4 04/15/2018 1158   BILITOT 0.54 01/16/2017 0924   GFRNONAA 25 (L) 09/17/2018 0455   GFRNONAA 25 (L) 04/15/2018 1158   GFRAA 29 (L) 09/17/2018 0455   GFRAA 29 (L) 04/15/2018 1158   Lipase     Component Value Date/Time   LIPASE 39 09/09/2018 0805       Studies/Results: Dg Abd Portable 1v  Result Date: 09/16/2018 CLINICAL DATA:  Small bowel obstruction. EXAM: PORTABLE ABDOMEN - 1 VIEW COMPARISON:  09/15/2018 FINDINGS: Dilated loop of small bowel within the left lower quadrant of abdomen is improved from previous exam now measuring 4.3 cm versus 5.5 cm previously. Adjacent dilated small bowel loop has resolved in the interval. No new findings. No pneumoperitoneum identified. IMPRESSION: 1. Mild improvement in dilated small bowel loop within the left lower quadrant of the abdomen suggesting improving obstruction. Electronically Signed   By: Kerby Moors M.D.   On: 09/16/2018 08:48    Anti-infectives: Anti-infectives (From admission, onward)   Start     Dose/Rate Route Frequency Ordered Stop   09/09/18 1100  cefTRIAXone (ROCEPHIN) 2  g in sodium chloride 0.9 % 100 mL IVPB  Status:  Discontinued     2 g 200 mL/hr over 30 Minutes Intravenous Every 24 hours 09/09/18 1051 09/15/18 1357   09/09/18 1100  azithromycin (ZITHROMAX) 500 mg in sodium chloride 0.9 % 250 mL IVPB  Status:  Discontinued     500 mg 250 mL/hr over 60 Minutes Intravenous Every 24 hours 09/09/18 1051 09/15/18 1357       Assessment/Plan Hx of gastric cancer - s/p distal gastrectomy in 2018 Dr. Barry Dienes Hx of prostate cancer - s/p radiation AKI on CKD -per primary Constipation BPH GERD HTN ?COPD - 2ppd smoking hx  Multifocal pneumonia- seen on CXR and CT, COVID negative, perprimaryservice  SBO vs ileus - CT with sbo with transition in  mid ileal region - tolerating FLD and having bowel function - continue current bowel regimen - advance to soft diet today - continue to mobilize  QAS:UORV diet  VTE: SCDs,SQ heparin ID: azithromycin/rocephin8/27>9/2  LOS: 8 days    Brigid Re , [facility] 09/17/2018, 8:37 AM Pager: [phone] Consults: [phone] 7:00 AM - 4:30 PM M, W-F 7:00 AM - 11:30 AM Tues, Sat, Sun

## 2018-09-17 NOTE — Progress Notes (Signed)
PROGRESS NOTE  Jacob [name] MBT:[id number] DOB: 07-28-1945 DOA: 09/09/2018 PCP: Prince Solian, MD  HPI/Recap of past 50 hours: 73 year old man PMH including stomach cancer in remission presenting with abdominal pain, nausea, hemoptysis, multifocal pneumonia.  09/16/18: Patient was seen and examined at his bedside this morning.  Reports abdominal pain 7 out of 10.  Positive flatus and positive bowel movement earlier this morning.  Mild improvement in dilated small bowel loops within the left lower quadrant of the abdomen suggesting improving obstruction.  General surgery advancing diet to full liquid.  09/17/18: Patient seen and examined at his bedside this morning.  States he is feeling much better today.  Reports positive flatus with no abdominal pain or nausea.  Bowel movement last night.  SBO is resolving.  Advancing diet.  Assessment/Plan: Principal Problem:   SBO (small bowel obstruction) (HCC) Active Problems:   Essential hypertension   Gastric adenocarcinoma (HCC)   Pneumonia   AKI (acute kidney injury) ([place])   CKD (chronic kidney disease), stage III (HCC)  Resolving small bowel obstruction versus ileus Symptomatology and imaging improving Continue to mobilize Positive flatus with no abdominal pain or nausea BM last night Diet advanced as tolerated Management per surgery K+ goal >4 Daily BMP  Resolving multifocal community-acquired pneumonia Completed 7 days of Rocephin and azithromycin on 09/15/2018 Afebrile, leukocytosis is resolving  Uncontrolled hypertension  BP not at goal Continue labetalol IV 5 mg every 6 hours, if no improvement will increase dose to 10 mg 3 times daily. 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    Objective: Vitals:   09/16/18 0807 09/16/18 2317 09/17/18 0053 09/17/18 0731  BP: (!) 164/96 (!) 158/84 (!) 163/97 (!) 157/95  Pulse: 68 72 78 72  Resp:  18  17  Temp:  98.4 F (36.9 C)  97.7 F (36.5 C)  TempSrc:  Oral    SpO2: 96% 95%  97%  Weight:      Height:       No intake or output data in the 24 hours ending 09/17/18 1413 Filed Weights   09/09/18 0737  Weight: 108.9 kg    Exam:  . General: 73 y.o. year-old male Obese no acute distress.  Alert and oriented x3.   . Cardiovascular: RRR no rubs or gallops.  No JVD or thyromegaly noted.  Marland Kitchen Respiratory: No wheezes or rales.  Good respiratory effort.   . Abdomen: Nontender bowel sounds present. . Musculoskeletal: Trace lower extremity edema bilaterally.  Peripheral pulses in all 4 extremities.   Marland Kitchen Psychiatry: Mood is appropriate for condition and setting   Data Reviewed: CBC: Recent Labs  Lab 09/11/18 0530 09/12/18 0714 09/13/18 0641 09/14/18 0431 09/15/18 0500 09/16/18 0520  WBC 12.7* 10.3 10.5 10.4 14.3* 11.4*  NEUTROABS 10.3* 7.9* 7.8* 8.6* 12.4*  --   HGB 9.8* 10.3* 10.1* 10.1* 9.6* 9.4*  HCT 28.7* 30.1* 30.4* 30.5*  29.7* 29.1*  MCV 93.8 94.4 94.4 96.2 98.0 97.7  PLT 261 283 300 291 259 614   Basic Metabolic Panel: Recent Labs  Lab 09/11/18 0530 09/12/18 0714  09/13/18 0641 09/14/18 0431 09/15/18 0500 09/16/18 0520 09/17/18 0455  NA 139 140   < > 140 139 139 137 135  K 5.0 5.9*   < > 4.9 5.1 5.2* 4.4 4.4  CL 108 108   < > 113* 111 108 105 105  CO2 21* 19*   < > 18* 20* 20* 21* 21*  GLUCOSE 106* 94   < > 90 115* 102* 80 95  BUN  79* 80*   < > 70* 55* 50* 43* 36*  CREATININE 3.19* 3.22*   < > 2.89* 2.53* 2.52* 2.61* 2.49*  CALCIUM 9.8 10.0   < > 10.0 9.9 9.9 9.5 9.8  MG 2.6* 2.7*  --  2.5* 2.4 2.3  --   --   PHOS 4.2 4.5  --  4.5 4.2 4.0  --   --    < > = values in this interval not displayed.   GFR: Estimated Creatinine Clearance: 35.2 mL/min (A) (by C-G formula based on SCr of 2.49 mg/dL (H)). Liver Function Tests: Recent Labs  Lab 09/11/18 0530 09/12/18 0714 09/13/18 0641 09/14/18 0431 09/15/18 0500  AST 13* 24 19 19 18   ALT 11 11 12 12 13   ALKPHOS 57 59 46 47 44  BILITOT 0.7 1.3* 0.9 0.8 0.8  PROT 6.4* 6.3* 6.6 6.7 6.2*  ALBUMIN 2.5* 2.7* 2.8* 2.9* 2.8*   No results for input(s): LIPASE, AMYLASE in the last 168 hours. No results for input(s): AMMONIA in the last 168 hours. Coagulation Profile: No results for input(s): INR, PROTIME in the last 168 hours. Cardiac Enzymes: No results for input(s): CKTOTAL, CKMB, CKMBINDEX, TROPONINI in the last 168 hours. BNP (last 3 results) No results for input(s): PROBNP in the last 8760 hours. HbA1C: No results for input(s): HGBA1C in the last 72 hours. CBG: No results for input(s): GLUCAP in the last 168 hours. Lipid Profile: No results for input(s): CHOL, HDL, LDLCALC, TRIG, CHOLHDL, LDLDIRECT in the last 72 hours. Thyroid Function Tests: No results for input(s): TSH, T4TOTAL, FREET4, T3FREE, THYROIDAB in the last 72 hours. Anemia Panel: No results for input(s): VITAMINB12, FOLATE, FERRITIN, TIBC, IRON, RETICCTPCT in the last 72 hours. Urine analysis:    Component Value Date/Time   COLORURINE YELLOW 09/09/2018 0910   APPEARANCEUR HAZY (A) 09/09/2018 0910   LABSPEC 1.016 09/09/2018 0910   PHURINE 5.0 09/09/2018 0910   GLUCOSEU NEGATIVE 09/09/2018 0910   HGBUR NEGATIVE 09/09/2018 0910   BILIRUBINUR NEGATIVE 09/09/2018 0910   KETONESUR 5 (A) 09/09/2018 0910   PROTEINUR 30 (A) 09/09/2018 0910   NITRITE NEGATIVE 09/09/2018 0910   LEUKOCYTESUR LARGE (A)  09/09/2018 0910   Sepsis Labs: @LABRCNTIP (procalcitonin:4,lacticidven:4)  ) Recent Results (from the past 240 hour(s))  SARS Coronavirus 2 [facility] order, Performed in [facility] hospital lab) Nasopharyngeal Nasopharyngeal Swab     Status: None   Collection Time: 09/09/18  8:10 AM   Specimen: Nasopharyngeal Swab  Result Value Ref Range Status   SARS Coronavirus 2 NEGATIVE NEGATIVE Final    Comment: (NOTE) If result is NEGATIVE SARS-CoV-2 target nucleic acids are NOT DETECTED. The SARS-CoV-2 RNA is generally detectable in upper and lower  respiratory specimens during the acute phase of infection. The lowest  concentration of SARS-CoV-2 viral copies this assay can detect is 250  copies / mL.  A negative result does not preclude SARS-CoV-2 infection  and should not be used as the sole basis for treatment or other  patient management decisions.  A negative result may occur with  improper specimen collection / handling, submission of specimen other  than nasopharyngeal swab, presence of viral mutation(s) within the  areas targeted by this assay, and inadequate number of viral copies  (<250 copies / mL). A negative result must be combined with clinical  observations, patient history, and epidemiological information. If result is POSITIVE SARS-CoV-2 target nucleic acids are DETECTED. The SARS-CoV-2 RNA is generally detectable in upper and lower  respiratory specimens dur ing the acute phase of infection.  Positive  results are indicative of active infection with SARS-CoV-2.  Clinical  correlation with patient history and other diagnostic information is  necessary to determine patient infection status.  Positive results do  not rule out bacterial infection or co-infection with other viruses. If result is PRESUMPTIVE POSTIVE SARS-CoV-2 nucleic acids MAY BE PRESENT.   A presumptive positive result was obtained on the submitted specimen  and confirmed on repeat testing.  While 2019 novel  coronavirus  (SARS-CoV-2) nucleic acids may be present in the submitted sample  additional confirmatory testing may be necessary for epidemiological  and / or clinical management purposes  to differentiate between  SARS-CoV-2 and other Sarbecovirus currently known to infect humans.  If clinically indicated additional testing with an alternate test  methodology [phone]) is advised. The SARS-CoV-2 RNA is generally  detectable in upper and lower respiratory sp ecimens during the acute  phase of infection. The expected result is Negative. Fact Sheet for Patients:  StrictlyIdeas.no Fact Sheet for Healthcare Providers: BankingDealers.co.za This test is not yet approved or cleared by the [place] FDA and has been authorized for detection and/or diagnosis of SARS-CoV-2 by FDA under an Emergency Use Authorization (EUA).  This EUA will remain in effect (meaning this test can be used) for the duration of the COVID-19 declaration under Section 564(b)(1) of the Act, 21 U.S.C. section 360bbb-3(b)(1), unless the authorization is terminated or revoked sooner. Performed at [facility], [place] [address], [place], [place] [address]   Urine culture     Status: Abnormal   Collection Time: 09/09/18 10:03 AM   Specimen: Urine, Clean Catch  Result Value Ref Range Status   Specimen Description URINE, CLEAN CATCH  Final   Special Requests   Final    NONE Performed at [facility], [place] [address] [place], [place] [address]    Culture 20,000 COLONIES/mL PROTEUS MIRABILIS (A)  Final   Report Status 09/11/2018 FINAL  Final   Organism ID, Bacteria PROTEUS MIRABILIS (A)  Final      Susceptibility   Proteus mirabilis - MIC*    AMPICILLIN <=2 SENSITIVE Sensitive     CEFAZOLIN <=4 SENSITIVE Sensitive     CEFTRIAXONE <=1 SENSITIVE Sensitive     CIPROFLOXACIN <=0.25 SENSITIVE Sensitive     GENTAMICIN <=1 SENSITIVE Sensitive     IMIPENEM 2  SENSITIVE Sensitive     NITROFURANTOIN 128 RESISTANT Resistant     TRIMETH/SULFA <=20 SENSITIVE Sensitive     AMPICILLIN/SULBACTAM <=2 SENSITIVE Sensitive     PIP/TAZO <=4 SENSITIVE Sensitive     * 20,000 COLONIES/mL PROTEUS MIRABILIS  Blood Culture (routine x 2)     Status: None   Collection Time: 09/09/18 11:20 AM   Specimen: BLOOD  Result Value Ref Range Status   Specimen Description BLOOD LEFT ANTECUBITAL  Final   Special Requests   Final    BOTTLES DRAWN AEROBIC AND ANAEROBIC Blood Culture adequate volume   Culture   Final    NO GROWTH 5 DAYS Performed at [facility], [address] [place], [place] [address]    Report Status 09/14/2018 FINAL  Final  Blood Culture (routine x 2)     Status: None   Collection Time: 09/09/18 11:22 AM   Specimen: BLOOD LEFT HAND  Result Value Ref Range Status   Specimen Description BLOOD LEFT HAND  Final   Special Requests   Final    BOTTLES DRAWN AEROBIC ONLY Blood Culture results may not be optimal due to an inadequate volume of blood received in culture bottles   Culture   Final    NO GROWTH 5 DAYS Performed at [facility], [place] [address], [place], [place] [address]    Report Status 09/14/2018 FINAL  Final      Studies: No results found.  Scheduled Meds: . docusate sodium  100 mg Oral BID  . heparin  5,000 Units Subcutaneous Q8H  . labetalol  10 mg Intravenous Q8H  . pantoprazole  40 mg Oral QAC supper  . polyethylene glycol  17 g Oral BID    Continuous Infusions:   LOS: 8 days     Kayleen Memos, MD [facility] Pager [phone]  If 7PM-7AM, please contact night-coverage [email or web address] Password [email or web address] 09/17/2018, 2:13 PM

## 2018-09-18 LAB — BASIC METABOLIC PANEL
Anion gap: 8 (ref 5–15)
BUN: 27 mg/dL — ABNORMAL HIGH (ref 8–23)
CO2: 20 mmol/L — ABNORMAL LOW (ref 22–32)
Calcium: 9.7 mg/dL (ref 8.9–10.3)
Chloride: 105 mmol/L (ref 98–111)
Creatinine, Ser: 2.23 mg/dL — ABNORMAL HIGH (ref 0.61–1.24)
GFR calc Af Amer: 33 mL/min — ABNORMAL LOW (ref >60)
GFR calc non Af Amer: 28 mL/min — ABNORMAL LOW (ref >60)
Glucose, Bld: 116 mg/dL — ABNORMAL HIGH (ref 70–99)
Potassium: 4.2 mmol/L (ref 3.5–5.1)
Sodium: 133 mmol/L — ABNORMAL LOW (ref 135–145)

## 2018-09-18 LAB — SURGICAL PCR SCREEN
MRSA, PCR: NEGATIVE
Staphylococcus aureus: NEGATIVE

## 2018-09-18 MED ORDER — SODIUM BICARBONATE 650 MG PO TABS
650.0000 mg | ORAL_TABLET | Freq: Once | ORAL | Status: AC
  Administered 2018-09-18: 650 mg via ORAL
  Filled 2018-09-18: qty 1

## 2018-09-18 MED ORDER — NEBIVOLOL HCL 10 MG PO TABS
20.0000 mg | ORAL_TABLET | Freq: Every day | ORAL | Status: DC
  Administered 2018-09-18 – 2018-09-19 (×2): 20 mg via ORAL
  Filled 2018-09-18 (×2): qty 2

## 2018-09-18 MED ORDER — GABAPENTIN 400 MG PO CAPS: 800.0000 mg | ORAL_CAPSULE | Freq: Every day | ORAL | Status: DC

## 2018-09-18 MED ORDER — PANTOPRAZOLE SODIUM 40 MG PO TBEC: 40.0000 mg | DELAYED_RELEASE_TABLET | Freq: Every day | ORAL | 0 refills | Status: DC

## 2018-09-18 MED ORDER — POLYETHYLENE GLYCOL 3350 17 G PO PACK: 17.0000 g | PACK | Freq: Two times a day (BID) | ORAL | 0 refills | Status: DC

## 2018-09-18 MED ORDER — VERAPAMIL HCL ER 300 MG PO CP24: 300.0000 mg | ORAL_CAPSULE | Freq: Every day | ORAL | Status: DC

## 2018-09-18 MED ORDER — MELATONIN 3 MG PO TABS
9.0000 mg | ORAL_TABLET | Freq: Every evening | ORAL | Status: DC | PRN
  Administered 2018-09-18: 9 mg via ORAL
  Filled 2018-09-18 (×2): qty 3

## 2018-09-18 MED ORDER — BUPROPION HCL ER (XL) 300 MG PO TB24
300.0000 mg | ORAL_TABLET | Freq: Every day | ORAL | Status: DC
  Administered 2018-09-18 – 2018-09-19 (×2): 300 mg via ORAL
  Filled 2018-09-18 (×2): qty 1

## 2018-09-18 MED ORDER — HYDRALAZINE HCL 25 MG PO TABS
25.0000 mg | ORAL_TABLET | Freq: Two times a day (BID) | ORAL | Status: DC
  Administered 2018-09-18 – 2018-09-19 (×3): 25 mg via ORAL
  Filled 2018-09-18 (×3): qty 1

## 2018-09-18 MED ORDER — VERAPAMIL HCL ER 180 MG PO TBCR
300.0000 mg | EXTENDED_RELEASE_TABLET | Freq: Every day | ORAL | Status: DC
  Administered 2018-09-18: 300 mg via ORAL
  Filled 2018-09-18 (×2): qty 1

## 2018-09-18 MED ORDER — GABAPENTIN 300 MG PO CAPS
300.0000 mg | ORAL_CAPSULE | Freq: Every day | ORAL | Status: DC
  Administered 2018-09-18: 300 mg via ORAL
  Filled 2018-09-18: qty 1

## 2018-09-18 MED ORDER — FUROSEMIDE 80 MG PO TABS
80.0000 mg | ORAL_TABLET | ORAL | Status: DC
  Administered 2018-09-18: 80 mg via ORAL
  Filled 2018-09-18 (×2): qty 1

## 2018-09-18 MED ORDER — TAMSULOSIN HCL 0.4 MG PO CAPS
0.4000 mg | ORAL_CAPSULE | Freq: Every day | ORAL | Status: DC
  Administered 2018-09-18 – 2018-09-19 (×2): 0.4 mg via ORAL
  Filled 2018-09-18 (×2): qty 1

## 2018-09-18 MED ORDER — GABAPENTIN 300 MG PO CAPS: 300.0000 mg | ORAL_CAPSULE | Freq: Every day | ORAL | 0 refills | Status: DC

## 2018-09-18 NOTE — Evaluation (Signed)
Physical Therapy Evaluation Patient Details Name: Jacob [name] MRN: [id number] DOB: 1945-10-18 Today's Date: 09/18/2018   History of Present Illness  73 yo male with onset of abd pain and SOB was admitted, has resolving PNA and SBO.  PT referred to ck mobility.  Has complicating stomach CA in remission.  PMHx:  HTN, stomach CA, OSA, prostate CA, CKD, UTI, LBP, PUD  Clinical Impression  Pt is up to walk with assist, note his ability to control RW is fairly good, and should try stairs next visit to complete therapy.  He is demonstrating a lack of endurance for moving/walking related to recent conditions, and resulting disuse/limited mobility.  Follow acutely to work on stairs for home, and will recommend any needed changes in his AD request.    Follow Up Recommendations No PT follow up    Equipment Recommendations  Rolling walker with 5" wheels    Recommendations for Other Services       Precautions / Restrictions Precautions Precautions: Fall Precaution Comments: low fall risk Restrictions Weight Bearing Restrictions: No      Mobility  Bed Mobility Overal bed mobility: Modified Independent                Transfers Overall transfer level: Modified independent Equipment used: None                Ambulation/Gait Ambulation/Gait assistance: Min guard Gait Distance (Feet): 180 Feet Assistive device: Rolling walker (2 wheeled)(pt asked to use RW) Gait Pattern/deviations: Step-through pattern;Decreased stride length;Wide base of support Gait velocity: reduced   General Gait Details: Pt is able to maneuver walker and to control his balance out of the room but in room drops the walker and widens his base  Stairs            Wheelchair Mobility    Modified Rankin (Stroke Patients Only)       Balance Overall balance assessment: Needs assistance Sitting-balance support: Feet supported Sitting balance-Leahy Scale: Good     Standing balance  support: Bilateral upper extremity supported;During functional activity Standing balance-Leahy Scale: Fair                               Pertinent Vitals/Pain Pain Assessment: No/denies pain    Home Living Family/patient expects to be discharged to:: Private residence Living Arrangements: Spouse/significant other;Children Available Help at Discharge: Family;Available 24 hours/day Type of Home: House Home Access: Stairs to enter Entrance Stairs-Rails: Right;Left;Can reach both Entrance Stairs-Number of Steps: 5 Home Layout: One level Home Equipment: None      Prior Function Level of Independence: Independent         Comments: reports he has been able to walk with no AD, but wife on a SPC     Hand Dominance   Dominant Hand: Right    Extremity/Trunk Assessment   Upper Extremity Assessment Upper Extremity Assessment: Overall WFL for tasks assessed    Lower Extremity Assessment Lower Extremity Assessment: Overall WFL for tasks assessed    Cervical / Trunk Assessment Cervical / Trunk Assessment: Normal  Communication   Communication: No difficulties  Cognition Arousal/Alertness: Awake/alert Behavior During Therapy: WFL for tasks assessed/performed Overall Cognitive Status: Within Functional Limits for tasks assessed                                 General Comments: Pt is very motivated and discussed  details of his current mobility very accurately      General Comments General comments (skin integrity, edema, etc.): pt was admitted with PNA and ck of O2 sat pregait was 97% but post gait was 99%    Exercises     Assessment/Plan    PT Assessment Patient needs continued PT services  PT Problem List Decreased range of motion;Decreased activity tolerance;Decreased balance;Decreased mobility;Decreased coordination;Decreased cognition;Decreased knowledge of use of DME;Decreased safety awareness;Cardiopulmonary status limiting activity        PT Treatment Interventions DME instruction;Gait training;Stair training;Functional mobility training;Therapeutic activities;Therapeutic exercise;Balance training;Neuromuscular re-education;Patient/family education    PT Goals (Current goals can be found in the Care Plan section)  Acute Rehab PT Goals Patient Stated Goal: to walk and feel better to get home PT Goal Formulation: With patient Time For Goal Achievement: 10/02/18 Potential to Achieve Goals: Good    Frequency Min 3X/week   Barriers to discharge Inaccessible home environment;Decreased caregiver support has stairs to enter house and using rails to ascend them    Co-evaluation               AM-PAC PT "6 Clicks" Mobility  Outcome Measure Help needed turning from your back to your side while in a flat bed without using bedrails?: None Help needed moving from lying on your back to sitting on the side of a flat bed without using bedrails?: A Little Help needed moving to and from a bed to a chair (including a wheelchair)?: A Little Help needed standing up from a chair using your arms (e.g., wheelchair or bedside chair)?: A Little Help needed to walk in hospital room?: A Little Help needed climbing 3-5 steps with a railing? : A Lot 6 Click Score: 18    End of Session Equipment Utilized During Treatment: Gait belt Activity Tolerance: Patient tolerated treatment well;Patient limited by fatigue Patient left: in chair;with call bell/phone within reach Nurse Communication: Mobility status PT Visit Diagnosis: Unsteadiness on feet (R26.81);Difficulty in walking, not elsewhere classified (R26.2)    Time: 1202-1222 PT Time Calculation (min) (ACUTE ONLY): 20 min   Charges:   PT Evaluation $PT Eval Moderate Complexity: 1 Mod         Ramond Dial 09/18/2018, 1:31 PM   Mee Hives, PT MS Acute Rehab Dept. Number: [facility]

## 2018-09-18 NOTE — Progress Notes (Signed)
   Subjective/Chief Complaint: No vomiting.  Tolerating diet. Patient states he has no abdominal pain and is tolerating his diet.  No recorded bowel movement and he was unable to convey that to me.   Objective: Vital signs in last 24 hours: Temp:  [97.6 F (36.4 C)-98.9 F (37.2 C)] 98 F (36.7 C) (09/05 0728) Pulse Rate:  [84] 84 (09/05 0729) Resp:  [18-21] 18 (09/05 0729) BP: (143-153)/(94-98) 143/98 (09/05 0729) SpO2:  [96 %-99 %] 96 % (09/05 0729) Last BM Date: 09/17/18  Intake/Output from previous day: No intake/output data recorded. Intake/Output this shift: No intake/output data recorded.  General appearance: alert and cooperative GI: Soft less distended.  No peritonitis. Neurologic: Alert and oriented X 3, normal strength and tone. Normal symmetric reflexes. Normal coordination and gait  Lab Results:  Recent Labs    09/16/18 0520  WBC 11.4*  HGB 9.4*  HCT 29.1*  PLT 250   BMET Recent Labs    09/17/18 0455 09/18/18 0504  NA 135 133*  K 4.4 4.2  CL 105 105  CO2 21* 20*  GLUCOSE 95 116*  BUN 36* 27*  CREATININE 2.49* 2.23*  CALCIUM 9.8 9.7   PT/INR No results for input(s): LABPROT, INR in the last 72 hours. ABG No results for input(s): PHART, HCO3 in the last 72 hours.  Invalid input(s): PCO2, PO2  Studies/Results: No results found.  Anti-infectives: Anti-infectives (From admission, onward)   Start     Dose/Rate Route Frequency Ordered Stop   09/09/18 1100  cefTRIAXone (ROCEPHIN) 2 g in sodium chloride 0.9 % 100 mL IVPB  Status:  Discontinued     2 g 200 mL/hr over 30 Minutes Intravenous Every 24 hours 09/09/18 1051 09/15/18 1357   09/09/18 1100  azithromycin (ZITHROMAX) 500 mg in sodium chloride 0.9 % 250 mL IVPB  Status:  Discontinued     500 mg 250 mL/hr over 60 Minutes Intravenous Every 24 hours 09/09/18 1051 09/15/18 1357      Assessment/Plan: Hx of gastric cancer - s/p distal gastrectomy in 2018 Dr. Barry Dienes Hx of prostate cancer -  s/p radiation AKI on CKD -per primary Constipation BPH GERD HTN ?COPD - 2ppd smoking hx  Multifocal pneumonia- seen on CXR and CT, COVID negative, perprimaryservice  SBO vs ileus - CT with sbo with transition in mid ileal region - tolerating FLD and having bowel function - continue current bowel regimen -Continue soft diet today - continue to mobilize  TGG:YIRS diet  VTE: SCDs,SQ heparin ID: azithromycin/rocephin8/27>9/2   LOS: 9 days    Jacob [name] 09/18/2018

## 2018-09-18 NOTE — Progress Notes (Signed)
PROGRESS NOTE  Jacob [name] ASN:[id number] DOB: 08-07-1945 DOA: 09/09/2018 PCP: Prince Solian, MD  HPI/Recap of past 61 hours: 73 year old man PMH including stomach cancer in remission presenting with abdominal pain, nausea, hemoptysis, multifocal pneumonia.  Admitted for SBO. CT abdomen and pelvis without contrast done on 09/09/2018 showing SBO with area of transition in mid ileal region.  General surgery consulted and following.   09/18/18: Patient was slightly bedside this morning.  No acute events overnight.  He has tolerated a soft diet well.  Self-reported had 2 bowel movements yesterday.  Discussed with general surgery Dr. Brantley Stage, we will continue to observe for another 24 hours and if he continues to tolerate a diet with no acute complaints will discharge in the morning.  The patient denies any cardiopulmonary symptoms.  Assessment/Plan: Principal Problem:   SBO (small bowel obstruction) (HCC) Active Problems:   Essential hypertension   Gastric adenocarcinoma (HCC)   Pneumonia   AKI (acute kidney injury) ([place])   CKD (chronic kidney disease), stage III (HCC)  Resolving small bowel obstruction versus ileus Symptomatology and imaging improving Need to monitor labs Continue soft diet as tolerated Goal potassium greater than 4 Continue daily BMPs  Resolving multifocal community-acquired pneumonia Completed 7 days of Rocephin and azithromycin on 09/15/2018 Afebrile, leukocytosis is resolving  Resolving AKI on CKD 3 Appears to be currently at his baseline creatinine 2.2 with GFR of 30. Continue to avoid nephrotoxins Continue to monitor urine output Repeat BMP daily  Uncontrolled hypertension  BP not at goal Start scheduled labetalol Restart home antihypertensive medications Continue PRN IV antihypertensives Continue to closely monitor vital signs  Mild metabolic acidosis, likely secondary to acute renal failure Chemistry bicarb this morning 20 from 21 Give p.o.  sodium bicarb   Anemia of chronic disease in the setting of CKD 4 Hemoglobin is stable at 9.2 No sign of overt bleeding Continue to monitor  Resolved hyperkalemia K+ 5.2 >> 4.4   DVT prophylaxis: SQ heparin TID Code Status: Full Family Communication: none Disposition Plan:  Home possibly tomorrow 09/19/2018 or when general surgery signs of.    Objective: Vitals:   09/17/18 1628 09/17/18 2148 09/18/18 0728 09/18/18 0729  BP: (!) 153/94 (!) 153/97  (!) 143/98  Pulse: 84 84  84  Resp: 19 (!) 21  18  Temp: 97.6 F (36.4 C) 98.9 F (37.2 C) 98 F (36.7 C)   TempSrc: Oral Oral Oral   SpO2: 99% 98%  96%  Weight:      Height:       No intake or output data in the 24 hours ending 09/18/18 0957 Filed Weights   09/09/18 0737  Weight: 108.9 kg    Exam:  . General: 73 y.o. year-old male obese in no acute distress.  Alert and oriented x3.   . Cardiovascular: Regular rate and rhythm no rubs or gallops.  No JVD or thyromegaly noted.   Marland Kitchen Respiratory: No wheezes or rales.  Poor inspiratory effort. . Abdomen: Nontender obese, soft bowel sounds present.   . Musculoskeletal: Trace lower extremity edema bilaterally.  2 out of 4 pulses in all 4 extremities.   Marland Kitchen Psychiatry: Mood is appropriate for condition and setting.   Data Reviewed: CBC: Recent Labs  Lab 09/12/18 0714 09/13/18 0641 09/14/18 0431 09/15/18 0500 09/16/18 0520  WBC 10.3 10.5 10.4 14.3* 11.4*  NEUTROABS 7.9* 7.8* 8.6* 12.4*  --   HGB 10.3* 10.1* 10.1* 9.6* 9.4*  HCT 30.1* 30.4* 30.5* 29.7* 29.1*  MCV 94.4 94.4  96.2 98.0 97.7  PLT 283 300 291 259 580   Basic Metabolic Panel: Recent Labs  Lab 09/12/18 0714  09/13/18 0641 09/14/18 0431 09/15/18 0500 09/16/18 0520 09/17/18 0455 09/18/18 0504  NA 140   < > 140 139 139 137 135 133*  K 5.9*   < > 4.9 5.1 5.2* 4.4 4.4 4.2  CL 108   < > 113* 111 108 105 105 105  CO2 19*   < > 18* 20* 20* 21* 21* 20*  GLUCOSE 94   < > 90 115* 102* 80 95 116*  BUN 80*   < >  70* 55* 50* 43* 36* 27*  CREATININE 3.22*   < > 2.89* 2.53* 2.52* 2.61* 2.49* 2.23*  CALCIUM 10.0   < > 10.0 9.9 9.9 9.5 9.8 9.7  MG 2.7*  --  2.5* 2.4 2.3  --   --   --   PHOS 4.5  --  4.5 4.2 4.0  --   --   --    < > = values in this interval not displayed.   GFR: Estimated Creatinine Clearance: 39.3 mL/min (A) (by C-G formula based on SCr of 2.23 mg/dL (H)). Liver Function Tests: Recent Labs  Lab 09/12/18 0714 09/13/18 0641 09/14/18 0431 09/15/18 0500  AST 24 19 19 18   ALT 11 12 12 13   ALKPHOS 59 46 47 44  BILITOT 1.3* 0.9 0.8 0.8  PROT 6.3* 6.6 6.7 6.2*  ALBUMIN 2.7* 2.8* 2.9* 2.8*   No results for input(s): LIPASE, AMYLASE in the last 168 hours. No results for input(s): AMMONIA in the last 168 hours. Coagulation Profile: No results for input(s): INR, PROTIME in the last 168 hours. Cardiac Enzymes: No results for input(s): CKTOTAL, CKMB, CKMBINDEX, TROPONINI in the last 168 hours. BNP (last 3 results) No results for input(s): PROBNP in the last 8760 hours. HbA1C: No results for input(s): HGBA1C in the last 72 hours. CBG: No results for input(s): GLUCAP in the last 168 hours. Lipid Profile: No results for input(s): CHOL, HDL, LDLCALC, TRIG, CHOLHDL, LDLDIRECT in the last 72 hours. Thyroid Function Tests: No results for input(s): TSH, T4TOTAL, FREET4, T3FREE, THYROIDAB in the last 72 hours. Anemia Panel: No results for input(s): VITAMINB12, FOLATE, FERRITIN, TIBC, IRON, RETICCTPCT in the last 72 hours. Urine analysis:    Component Value Date/Time   COLORURINE YELLOW 09/09/2018 0910   APPEARANCEUR HAZY (A) 09/09/2018 0910   LABSPEC 1.016 09/09/2018 0910   PHURINE 5.0 09/09/2018 0910   GLUCOSEU NEGATIVE 09/09/2018 0910   HGBUR NEGATIVE 09/09/2018 0910   BILIRUBINUR NEGATIVE 09/09/2018 0910   KETONESUR 5 (A) 09/09/2018 0910   PROTEINUR 30 (A) 09/09/2018 0910   NITRITE NEGATIVE 09/09/2018 0910   LEUKOCYTESUR LARGE (A) 09/09/2018 0910   Sepsis  Labs: @LABRCNTIP (procalcitonin:4,lacticidven:4)  ) Recent Results (from the past 240 hour(s))  SARS Coronavirus 2 [facility] order, Performed in [facility] hospital lab) Nasopharyngeal Nasopharyngeal Swab     Status: None   Collection Time: 09/09/18  8:10 AM   Specimen: Nasopharyngeal Swab  Result Value Ref Range Status   SARS Coronavirus 2 NEGATIVE NEGATIVE Final    Comment: (NOTE) If result is NEGATIVE SARS-CoV-2 target nucleic acids are NOT DETECTED. The SARS-CoV-2 RNA is generally detectable in upper and lower  respiratory specimens during the acute phase of infection. The lowest  concentration of SARS-CoV-2 viral copies this assay can detect is 250  copies / mL. A negative result does not preclude SARS-CoV-2 infection  and should not  be used as the sole basis for treatment or other  patient management decisions.  A negative result may occur with  improper specimen collection / handling, submission of specimen other  than nasopharyngeal swab, presence of viral mutation(s) within the  areas targeted by this assay, and inadequate number of viral copies  (<250 copies / mL). A negative result must be combined with clinical  observations, patient history, and epidemiological information. If result is POSITIVE SARS-CoV-2 target nucleic acids are DETECTED. The SARS-CoV-2 RNA is generally detectable in upper and lower  respiratory specimens dur ing the acute phase of infection.  Positive  results are indicative of active infection with SARS-CoV-2.  Clinical  correlation with patient history and other diagnostic information is  necessary to determine patient infection status.  Positive results do  not rule out bacterial infection or co-infection with other viruses. If result is PRESUMPTIVE POSTIVE SARS-CoV-2 nucleic acids MAY BE PRESENT.   A presumptive positive result was obtained on the submitted specimen  and confirmed on repeat testing.  While 2019 novel coronavirus  (SARS-CoV-2)  nucleic acids may be present in the submitted sample  additional confirmatory testing may be necessary for epidemiological  and / or clinical management purposes  to differentiate between  SARS-CoV-2 and other Sarbecovirus currently known to infect humans.  If clinically indicated additional testing with an alternate test  methodology [phone]) is advised. The SARS-CoV-2 RNA is generally  detectable in upper and lower respiratory sp ecimens during the acute  phase of infection. The expected result is Negative. Fact Sheet for Patients:  StrictlyIdeas.no Fact Sheet for Healthcare Providers: BankingDealers.co.za This test is not yet approved or cleared by the [place] FDA and has been authorized for detection and/or diagnosis of SARS-CoV-2 by FDA under an Emergency Use Authorization (EUA).  This EUA will remain in effect (meaning this test can be used) for the duration of the COVID-19 declaration under Section 564(b)(1) of the Act, 21 U.S.C. section 360bbb-3(b)(1), unless the authorization is terminated or revoked sooner. Performed at [facility], [place] [address], [place], [place] [address]   Urine culture     Status: Abnormal   Collection Time: 09/09/18 10:03 AM   Specimen: Urine, Clean Catch  Result Value Ref Range Status   Specimen Description URINE, CLEAN CATCH  Final   Special Requests   Final    NONE Performed at [facility], [place] [address] [place], [place] [address]    Culture 20,000 COLONIES/mL PROTEUS MIRABILIS (A)  Final   Report Status 09/11/2018 FINAL  Final   Organism ID, Bacteria PROTEUS MIRABILIS (A)  Final      Susceptibility   Proteus mirabilis - MIC*    AMPICILLIN <=2 SENSITIVE Sensitive     CEFAZOLIN <=4 SENSITIVE Sensitive     CEFTRIAXONE <=1 SENSITIVE Sensitive     CIPROFLOXACIN <=0.25 SENSITIVE Sensitive     GENTAMICIN <=1 SENSITIVE Sensitive     IMIPENEM 2 SENSITIVE Sensitive      NITROFURANTOIN 128 RESISTANT Resistant     TRIMETH/SULFA <=20 SENSITIVE Sensitive     AMPICILLIN/SULBACTAM <=2 SENSITIVE Sensitive     PIP/TAZO <=4 SENSITIVE Sensitive     * 20,000 COLONIES/mL PROTEUS MIRABILIS  Blood Culture (routine x 2)     Status: None   Collection Time: 09/09/18 11:20 AM   Specimen: BLOOD  Result Value Ref Range Status   Specimen Description BLOOD LEFT ANTECUBITAL  Final   Special Requests   Final    BOTTLES  DRAWN AEROBIC AND ANAEROBIC Blood Culture adequate volume   Culture   Final    NO GROWTH 5 DAYS Performed at [facility], [place] [address], [place], [place] [address]    Report Status 09/14/2018 FINAL  Final  Blood Culture (routine x 2)     Status: None   Collection Time: 09/09/18 11:22 AM   Specimen: BLOOD LEFT HAND  Result Value Ref Range Status   Specimen Description BLOOD LEFT HAND  Final   Special Requests   Final    BOTTLES DRAWN AEROBIC ONLY Blood Culture results may not be optimal due to an inadequate volume of blood received in culture bottles   Culture   Final    NO GROWTH 5 DAYS Performed at [facility], [place] [address] [place], [place] [address]    Report Status 09/14/2018 FINAL  Final  Surgical PCR screen     Status: None   Collection Time: 09/18/18  5:04 AM   Specimen: Nasal Mucosa; Nasal Swab  Result Value Ref Range Status   MRSA, PCR NEGATIVE NEGATIVE Final   Staphylococcus aureus NEGATIVE NEGATIVE Final    Comment: (NOTE) The Xpert SA Assay (FDA approved for NASAL specimens in patients 60 years of age and older), is one component of a comprehensive surveillance program. It is not intended to diagnose infection nor to guide or monitor treatment. Performed at [facility], [place] [address] [place], [place] [address]       Studies: No results found.  Scheduled Meds: . buPROPion  300 mg Oral Daily  . docusate sodium  100 mg Oral BID  . furosemide  80 mg Oral QODAY  . gabapentin  300 mg Oral QHS  . heparin   5,000 Units Subcutaneous Q8H  . hydrALAZINE  25 mg Oral BID  . mupirocin ointment  1 application Nasal BID  . nebivolol  20 mg Oral Daily  . pantoprazole  40 mg Oral QAC supper  . polyethylene glycol  17 g Oral BID  . tamsulosin  0.4 mg Oral Daily  . verapamil  300 mg Oral QHS    Continuous Infusions:   LOS: 9 days     Kayleen Memos, MD [facility] Pager [phone]  If 7PM-7AM, please contact night-coverage [email or web address] Password [email or web address] 09/18/2018, 9:57 AM

## 2018-09-19 LAB — BASIC METABOLIC PANEL
Anion gap: 9 (ref 5–15)
BUN: 24 mg/dL — ABNORMAL HIGH (ref 8–23)
CO2: 21 mmol/L — ABNORMAL LOW (ref 22–32)
Calcium: 9.6 mg/dL (ref 8.9–10.3)
Chloride: 104 mmol/L (ref 98–111)
Creatinine, Ser: 2.46 mg/dL — ABNORMAL HIGH (ref 0.61–1.24)
GFR calc Af Amer: 29 mL/min — ABNORMAL LOW (ref >60)
GFR calc non Af Amer: 25 mL/min — ABNORMAL LOW (ref >60)
Glucose, Bld: 93 mg/dL (ref 70–99)
Potassium: 4.4 mmol/L (ref 3.5–5.1)
Sodium: 134 mmol/L — ABNORMAL LOW (ref 135–145)

## 2018-09-19 MED ORDER — HEPARIN SOD (PORK) LOCK FLUSH 100 UNIT/ML IV SOLN: 500.0000 [IU] | INTRAVENOUS | Status: DC | PRN

## 2018-09-19 NOTE — Progress Notes (Signed)
Patient with L chest PAC. 500 units of heparin instilled before deaccessed. PAC site clean dry and intact from deaccessed port site.  Patient RN made aware that port has been deaccessed per MD order.

## 2018-09-19 NOTE — Discharge Summary (Signed)
Discharge Summary  Jacob [name] FUX:[id number] DOB: 07-26-1945  PCP: Prince Solian, MD  Admit date: 09/09/2018 Discharge date: 09/19/2018  Time spent: 35 minutes  Recommendations for Outpatient Follow-up:  1. Follow-up with GI 2. Follow-up with your oncologist 3. Follow-up with your PCP 4. Take your medications as prescribed  Discharge Diagnoses:  Active Hospital Problems   Diagnosis Date Noted  . SBO (small bowel obstruction) ([place]) 09/09/2018  . AKI (acute kidney injury) ([place]) 09/15/2018  . CKD (chronic kidney disease), stage III ([place]) 09/15/2018  . Pneumonia 09/09/2018  . Gastric adenocarcinoma ([place])   . Essential hypertension 03/19/2007    Resolved Hospital Problems  No resolved problems to display.    Discharge Condition: Stable  Diet recommendation: Per general surgery: Recommend soft diet for a few days then reg diet, eat small frequent meals and avoid large meals. Medication recommendations: bowel regimen to keep stools soft and regular, avoid constipation.  Vitals:   09/19/18 0004 09/19/18 0712  BP: (!) 160/95 133/87  Pulse: 84 81  Resp: 18 16  Temp: 99 F (37.2 C) 98.7 F (37.1 C)  SpO2: 98% 96%    History of present illness:  73 year old man PMH including stomach cancer in remission presenting with abdominal pain, nausea, hemoptysis, multifocal pneumonia.  Admitted for SBO. CT abdomen and pelvis without contrast done on 09/09/2018 showing SBO with area of transition in mid ileal region.  General surgery consulted and followed.  Resolving small bowel obstruction.    09/19/18: Patient was seen and examined at his bedside this morning.  Denies nausea or vomiting on soft diet.  Endorses some mild pain after eating.  Positive flatus and bowel movement.  Denies any cardiopulmonary symptoms.   Hospital Course:  Principal Problem:   SBO (small bowel obstruction) ([facility]) Active Problems:   Essential hypertension   Gastric adenocarcinoma ([facility])    Pneumonia   AKI (acute kidney injury) ([place])   CKD (chronic kidney disease), stage III ([facility])  Resolving small bowel obstruction versus ileus Symptomatology and imaging improving Continue soft diet as recommended by general surgery, as stated above. Goal potassium greater than 4 Potassium 4.4 on 09/19/2018. general surgery: Recommend soft diet for a few days then reg diet, eat small frequent meals and avoid large meals. Medication recommendations: bowel regimen to keep stools soft and regular, avoid constipation.  Resolving multifocal community-acquired pneumonia Completed 7 days of Rocephin and azithromycin on 09/15/2018 Afebrile, leukocytosis is resolving  Resolving AKI on CKD 4 Creatinine 2.46 with GFR of 29 on 09/19/2018. Continue to avoid nephrotoxins Follow-up with your primary care provider Obtain nephrology referral from your PCP  Resolved uncontrolled hypertension  Blood pressure is now at goal Continue home antihypertensive medications  Resolving mild metabolic acidosis, likely secondary to acute renal failure Chemistry bicarb this morning 21 from 20 Received p.o. 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 No sign of overt bleeding.  Resolved hyperkalemia K+ 5.2 >> 4.4   Code Status:Full    Consultations:  General surgery  Discharge Exam: BP 133/87 (BP Location: Right Arm)   Pulse 81   Temp 98.7 F (37.1 C) (Oral)   Resp 16   Ht 6\' 2"  (1.88 m)   Wt 108.9 kg   SpO2 96%   BMI 30.81 kg/m  . General: 73 y.o. year-old male well developed well nourished in no acute distress.  Alert and oriented x3. . Cardiovascular: Regular  rate and rhythm with no rubs or gallops.  No thyromegaly or JVD noted.   Marland Kitchen Respiratory: Clear to auscultation with no wheezes or rales. Good inspiratory effort. . Abdomen: Soft obese nontender with bowel sounds  present. . Musculoskeletal: Trace lower extremity edema. 2/4 pulses in all 4 extremities. Marland Kitchen Psychiatry: Mood is appropriate for condition and setting  Discharge Instructions You were cared for by a hospitalist during your hospital stay. If you have any questions about your discharge medications or the care you received while you were in the hospital after you are discharged, you can call the unit and asked to speak with the hospitalist on call if the hospitalist that took care of you is not available. Once you are discharged, your primary care physician will handle any further medical issues. Please note that NO REFILLS for any discharge medications will be authorized once you are discharged, as it is imperative that you return to your primary care physician (or establish a relationship with a primary care physician if you do not have one) for your aftercare needs so that they can reassess your need for medications and monitor your lab values.   Allergies as of 09/19/2018   No Known Allergies     Medication List    STOP taking these medications   cefdinir 300 MG capsule Commonly known as: OMNICEF   gabapentin 800 MG tablet Commonly known as: NEURONTIN Replaced by: gabapentin 300 MG capsule   traZODone 50 MG tablet Commonly known as: DESYREL     TAKE these medications   acetaminophen 500 MG tablet Commonly known as: TYLENOL Take 1,000 mg by mouth daily as needed for moderate pain or headache.   buPROPion 300 MG 24 hr tablet Commonly known as: WELLBUTRIN XL Take 300 mg by mouth daily.   Bystolic 20 MG Tabs Generic drug: Nebivolol HCl Take 20 mg by mouth daily.   calcium carbonate 500 MG chewable tablet Commonly known as: TUMS - dosed in mg elemental calcium Chew 3 tablets by mouth 2 (two) times daily as needed for indigestion or heartburn.   furosemide 40 MG tablet Commonly known as: LASIX Take 80 mg by mouth every other day.   gabapentin 300 MG capsule Commonly known as:  NEURONTIN Take 1 capsule (300 mg total) by mouth at bedtime. Replaces: gabapentin 800 MG tablet   hydrALAZINE 25 MG tablet Commonly known as: APRESOLINE Take 25 mg by mouth 2 (two) times daily.   Melatonin 10 MG Tabs Take 1 tablet by mouth at bedtime as needed (sleep).   pantoprazole 40 MG tablet Commonly known as: PROTONIX Take 1 tablet (40 mg total) by mouth daily before supper.   polycarbophil 625 MG tablet Commonly known as: FIBERCON Take 1 tablet (625 mg total) by mouth daily.   polyethylene glycol 17 g packet Commonly known as: MIRALAX / GLYCOLAX Take 17 g by mouth 2 (two) times daily.   prochlorperazine 10 MG tablet Commonly known as: COMPAZINE Take 1 tablet (10 mg total) by mouth every 6 (six) hours as needed for nausea or vomiting.   tamsulosin 0.4 MG Caps capsule Commonly known as: FLOMAX TAKE 1 CAPSULE BY MOUTH DAILY What changed: when to take this   Verapamil HCl CR 300 MG Cp24 Take 300 mg by mouth at bedtime.            Durable Medical Equipment  (From admission, onward)         Start     Ordered   09/19/18 [phone]  For home use only DME Walker rolling  Once    Comments: 5" wheels  Question:  Patient needs a walker to treat with the following condition  Answer:  Ambulatory dysfunction   09/19/18 0456         No Known Allergies Follow-up Information    [name], Ravisankar, MD. Call in 1 day(s).   Specialty: Internal Medicine Why: please call for a post hospital follow up appointment Contact information: [place] [address] [phone]        Wyatt Portela, MD. Call in 1 day(s).   Specialty: Oncology Why: please call for a post hospital follow up appointment Contact information: [place] [address] [phone]        Ladene Artist, MD. Call in 1 day(s).   Specialty: Gastroenterology Why: please call for a post hospital follow up appointment Contact information: [address] [place] [address] [phone]            The results of significant diagnostics from this hospitalization (including imaging, microbiology, ancillary and laboratory) are listed below for reference.    Significant Diagnostic Studies: Ct Abdomen Pelvis Wo Contrast  Result Date: 09/09/2018 CLINICAL DATA:  Abdominal pain with nausea and vomiting EXAM: CT ABDOMEN AND PELVIS WITHOUT CONTRAST TECHNIQUE: Multidetector CT imaging of the abdomen and pelvis was performed following the standard protocol without IV contrast. Oral contrast was also administered. COMPARISON:  September 15, 2017 and October 27, 2015 FINDINGS: Lower chest: There are multiple areas of airspace consolidation in the lower lobes consistent with multifocal pneumonia. Areas of consolidation are noted in portions of the right middle and right lower lobes. Opacification on the left is more patchy with areas of ground-glass type opacity in the inferior lingula and anterior segment left lower lobe. There are foci of coronary artery calcification. A small amount of contrast is seen in the distal esophagus. Hepatobiliary: No focal liver lesions are appreciable. Gallbladder wall is not appreciably thickened by CT. There is no evident biliary duct dilatation. Pancreas: There is no pancreatic mass or inflammatory focus. Spleen: No splenic lesions are evident. Adrenals/Urinary Tract: Adrenals bilaterally appear unremarkable. There is a cyst arising from the upper pole of the right kidney measuring 6.7 x 6.6 cm. There is a mass arising from the mid right kidney with a calcified rim measuring 2.0 x 1.8 cm, stable. There is no demonstrable hydronephrosis on either side. There is no renal or ureteral calculus on either side. Urinary bladder is midline with wall thickness within normal limits. Stomach/Bowel: Stomach is distended with fluid, air, and apparent food material in the dependent portion of the stomach. There is dilatation of most small  bowel loops. There is a transition zone in the mid ileal region consistent with small bowel obstruction. Terminal ileum appears normal. No free air or portal venous air. Vascular/Lymphatic: There is no abdominal aortic aneurysm. There are foci of vascular calcification in the aorta and iliac arteries. No adenopathy is evident in the abdomen or pelvis. Reproductive: There are apparent seed implants in the region of the prostate. Prostate and seminal vesicles are normal in size and contour. No pelvic mass is demonstrable. Other: There is fat in each inguinal ring. Question atrophic testis in the left inguinal ring medially. Appendix appears normal. No abscess is evident in the abdomen or pelvis. There is a slight amount of ascites adjacent to the liver. Musculoskeletal: There is degenerative change in the lumbar spine, most notably at L5-S1.  There are no blastic or lytic bone lesions. No intramuscular lesions are evident. IMPRESSION: 1. Small bowel obstruction with transition zone in the mid ileal region. No free air or portal venous air is demonstrable. 2.  Multifocal pneumonia. 3. Slight amount of ascites noted adjacent to the liver on the right. 4.  Seed implants in region of prostate. 5. Fat in each inguinal ring. Suspect atrophic testis left inguinal ring. 6.  No abscess in the abdomen or pelvis. 7. Aortic and iliac artery atherosclerosis. Foci of coronary artery calcification noted. Electronically Signed   By: Lowella Grip III M.D.   On: 09/09/2018 13:50   Dg Abd 1 View  Result Date: 09/15/2018 CLINICAL DATA:  Abdominal pain. EXAM: ABDOMEN - 1 VIEW COMPARISON:  Radiograph of September 14, 2018. FINDINGS: Stable small bowel dilatation is noted concerning for ileus or possibly distal small bowel obstruction. No definite colonic dilatation is noted. No radio-opaque calculi or other significant radiographic abnormality are seen. IMPRESSION: Stable small bowel dilatation is noted concerning for ileus or  possibly distal small bowel obstruction. Electronically Signed   By: Marijo Conception M.D.   On: 09/15/2018 09:22   Dg Abd 1 View  Result Date: 09/13/2018 CLINICAL DATA:  Small bowel obstruction. EXAM: ABDOMEN - 1 VIEW COMPARISON:  09/12/2018 FINDINGS: Probable slightly increased prominence of dilated small bowel loops. There is some air and fecal material visualized in the proximal colon. No free air identified. IMPRESSION: Slight worsening of partial small bowel obstruction. Electronically Signed   By: Aletta Edouard M.D.   On: 09/13/2018 08:06   Dg Chest Port 1 View  Result Date: 09/14/2018 CLINICAL DATA:  Shortness of breath EXAM: PORTABLE CHEST 1 VIEW COMPARISON:  09/13/2018 FINDINGS: Cardiac shadow is stable. Left chest wall port is again seen. The lungs are well aerated with mild bibasilar atelectatic changes stable from the previous day. No new focal infiltrate or sizable effusion is seen. No bony abnormality is noted. IMPRESSION: Bibasilar atelectatic changes stable from the previous day. Electronically Signed   By: Inez Catalina M.D.   On: 09/14/2018 09:01   Dg Chest Port 1 View  Result Date: 09/13/2018 CLINICAL DATA:  ? Smbo, no chest complaints EXAM: PORTABLE CHEST 1 VIEW COMPARISON:  Chest radiographs dated 09/12/2018, 09/10/2018 FINDINGS: Unchanged position of a left central venous catheter. Stable enlarged cardiomediastinal contours. Stable bibasilar streaky pulmonary opacities. No pneumothorax or large pleural effusion. Visualized skeleton unremarkable. IMPRESSION: Persistent bibasilar streaky pulmonary opacities possibly representing atelectasis or infection. Electronically Signed   By: Audie Pinto M.D.   On: 09/13/2018 08:07   Dg Chest Port 1 View  Result Date: 09/12/2018 CLINICAL DATA:  Pneumonia. EXAM: PORTABLE CHEST 1 VIEW COMPARISON:  Chest x-ray dated September 10, 2018. FINDINGS: Interval removal of the enteric tube. Unchanged left chest wall port catheter. Stable  cardiomediastinal silhouette. Normal pulmonary vascularity. Streaky bibasilar opacities have minimally improved. No pleural effusion or pneumothorax. No acute osseous abnormality. IMPRESSION: 1. Minimally improved streaky bibasilar opacities potentially reflecting resolving pneumonia and/or atelectasis. Electronically Signed   By: Titus Dubin M.D.   On: 09/12/2018 09:33   Dg Chest Port 1 View  Result Date: 09/10/2018 CLINICAL DATA:  73 year old male with shortness of breath and abdominal pain. EXAM: PORTABLE CHEST 1 VIEW COMPARISON:  09/09/2018 and earlier. FINDINGS: Portable AP semi upright view at 1610 hours. Stable left chest Port-A-Cath. Enteric tube has been placed and courses to the abdomen, tip not included. Continued low lung volumes. Stable cardiac size and  mediastinal contours. Visualized tracheal air column is within normal limits. No pneumothorax. No pulmonary edema. No definite pleural effusion. Streaky and confluent bibasilar opacity is stable. Paucity of bowel gas in the upper abdomen. No acute osseous abnormality identified. IMPRESSION: 1. Continued low lung volumes with stable bibasilar opacity. Top differential considerations are atelectasis and infection. 2. No new cardiopulmonary abnormality. Electronically Signed   By: Genevie Ann M.D.   On: 09/10/2018 16:27   Dg Chest Portable 1 View  Result Date: 09/09/2018 CLINICAL DATA:  Cough and hemoptysis EXAM: PORTABLE CHEST 1 VIEW COMPARISON:  December 04, 2015 FINDINGS: Port-A-Cath tip is in the superior vena cava. No pneumothorax. There is patchy airspace opacity in the lung bases. Lungs elsewhere are clear. Heart size and pulmonary vascularity are normal. No adenopathy. No bone lesions. IMPRESSION: Bibasilar patchy infiltrate, felt to represent multifocal pneumonia. No adenopathy. Heart size normal. Port-A-Cath tip in superior vena cava. Electronically Signed   By: Lowella Grip III M.D.   On: 09/09/2018 08:25   Dg Abd Portable  1v  Result Date: 09/16/2018 CLINICAL DATA:  Small bowel obstruction. EXAM: PORTABLE ABDOMEN - 1 VIEW COMPARISON:  09/15/2018 FINDINGS: Dilated loop of small bowel within the left lower quadrant of abdomen is improved from previous exam now measuring 4.3 cm versus 5.5 cm previously. Adjacent dilated small bowel loop has resolved in the interval. No new findings. No pneumoperitoneum identified. IMPRESSION: 1. Mild improvement in dilated small bowel loop within the left lower quadrant of the abdomen suggesting improving obstruction. Electronically Signed   By: Kerby Moors M.D.   On: 09/16/2018 08:48   Dg Abd Portable 1v  Result Date: 09/14/2018 CLINICAL DATA:  Abdominal pain EXAM: PORTABLE ABDOMEN - 1 VIEW COMPARISON:  09/13/2018 FINDINGS: Scattered large and small bowel gas is noted. Persistent small bowel dilatation is noted in the left mid abdomen stable from the previous exam. No free air is noted. No acute bony abnormality is noted. IMPRESSION: Persistent small bowel dilatation stable from previous exam. Electronically Signed   By: Inez Catalina M.D.   On: 09/14/2018 09:03   Dg Abd Portable 1v  Result Date: 09/12/2018 CLINICAL DATA:  Small bowel obstruction. EXAM: PORTABLE ABDOMEN - 1 VIEW COMPARISON:  Abdominal x-ray from yesterday. FINDINGS: Dilated fluid and air-filled small bowel loops are similar to yesterday. No radiopaque calculi or other significant abnormality. No acute osseous abnormality. IMPRESSION: 1. Unchanged small bowel obstruction. Electronically Signed   By: Titus Dubin M.D.   On: 09/12/2018 09:30   Dg Abd Portable 1v  Result Date: 09/11/2018 CLINICAL DATA:  Follow-up small bowel obstruction EXAM: PORTABLE ABDOMEN - 1 VIEW COMPARISON:  09/10/2018 FINDINGS: Single image shows some persistent fluid and air dilatation of the small intestine consistent with persistent small-bowel obstruction. No significant change suspected based on this single image. IMPRESSION: Persistent small  bowel obstruction pattern. Electronically Signed   By: Nelson Chimes M.D.   On: 09/11/2018 08:20   Dg Abd Portable 1v  Result Date: 09/11/2018 CLINICAL DATA:  Nasogastric tube placement EXAM: PORTABLE ABDOMEN - 1 VIEW COMPARISON:  09/10/2018 FINDINGS: The bowel gas pattern is normal. No radio-opaque calculi or other significant radiographic abnormality are seen. IMPRESSION: No nasogastric tube is visualized. It may be coiled in the esophagus. Electronically Signed   By: Ulyses Jarred M.D.   On: 09/11/2018 00:11   Dg Abd Portable 1v  Result Date: 09/10/2018 CLINICAL DATA:  NG tube placement EXAM: PORTABLE ABDOMEN - 1 VIEW COMPARISON:  09/10/2018 FINDINGS: NG  tube is not visualized in the mid to lower chest or abdomen. Bibasilar atelectasis. Nonobstructive bowel-gas crash that mildly prominent left abdominal small bowel loops again noted. IMPRESSION: No NG tube visualized. Electronically Signed   By: Rolm Baptise M.D.   On: 09/10/2018 23:47   Dg Abd Portable 1v-small Bowel Obstruction Protocol-initial, 8 Hr Delay  Result Date: 09/10/2018 CLINICAL DATA:  Small bowel obstruction EXAM: PORTABLE ABDOMEN - 1 VIEW COMPARISON:  09/10/2018 FINDINGS: Dilated small bowel loops in the left abdomen. Overall degree of bowel distention decreased since prior study. NG tube tip is seen in the stomach. Oral contrast material is seen in the fundus of the stomach. Moderate stool burden in the colon. IMPRESSION: Small bowel obstruction pattern, slightly improved with NG tube in the stomach. Electronically Signed   By: Rolm Baptise M.D.   On: 09/10/2018 18:48   Dg Abd Portable 1v  Result Date: 09/10/2018 CLINICAL DATA:  Small bowel obstruction. EXAM: PORTABLE ABDOMEN - 1 VIEW COMPARISON:  Radiograph of September 09, 2018. FINDINGS: Stable small bowel dilatation is noted in left upper quadrant concerning for ileus or distal small bowel obstruction. No colonic dilatation is noted. No abnormal calcifications are noted.  Nasogastric tube tip is again noted in proximal stomach. IMPRESSION: Stable small bowel dilatation is noted in left upper quadrant concerning for ileus or distal small bowel obstruction. Continued radiographic follow-up is recommended. Electronically Signed   By: Marijo Conception M.D.   On: 09/10/2018 08:57   Dg Abd Portable 1 View  Result Date: 09/09/2018 CLINICAL DATA:  Nasogastric placement. EXAM: PORTABLE ABDOMEN - 1 VIEW COMPARISON:  04/17/2018 FINDINGS: Nasogastric tube tip is in the midportion of the stomach. Mild small bowel dilatation, less than on the recent CT. IMPRESSION: Nasogastric tube tip in the midportion of the stomach. Decrease in small bowel dilatation. Electronically Signed   By: Nelson Chimes M.D.   On: 09/09/2018 16:12    Microbiology: Recent Results (from the past 240 hour(s))  Blood Culture (routine x 2)     Status: None   Collection Time: 09/09/18 11:20 AM   Specimen: BLOOD  Result Value Ref Range Status   Specimen Description BLOOD LEFT ANTECUBITAL  Final   Special Requests   Final    BOTTLES DRAWN AEROBIC AND ANAEROBIC Blood Culture adequate volume   Culture   Final    NO GROWTH 5 DAYS Performed at [facility], [address], [place], [place] [address]    Report Status 09/14/2018 FINAL  Final  Blood Culture (routine x 2)     Status: None   Collection Time: 09/09/18 11:22 AM   Specimen: BLOOD LEFT HAND  Result Value Ref Range Status   Specimen Description BLOOD LEFT HAND  Final   Special Requests   Final    BOTTLES DRAWN AEROBIC ONLY Blood Culture results may not be optimal due to an inadequate volume of blood received in culture bottles   Culture   Final    NO GROWTH 5 DAYS Performed at [facility], [place] [address], [place], [place] [address]    Report Status 09/14/2018 FINAL  Final  Surgical PCR screen     Status: None   Collection Time: 09/18/18  5:04 AM   Specimen: Nasal Mucosa; Nasal Swab  Result Value Ref Range Status   MRSA, PCR  NEGATIVE NEGATIVE Final   Staphylococcus aureus NEGATIVE NEGATIVE Final    Comment: (NOTE) The Xpert SA Assay (FDA approved for NASAL specimens in  patients 44 years of age and older), is one component of a comprehensive surveillance program. It is not intended to diagnose infection nor to guide or monitor treatment. Performed at [facility], [place] [address] [place], [place] [address]      Labs: Basic Metabolic Panel: Recent Labs  Lab 09/13/18 [phone] 09/14/18 0431 09/15/18 0500 09/16/18 0520 09/17/18 0455 09/18/18 0504 09/19/18 0643  NA 140 139 139 137 135 133* 134*  K 4.9 5.1 5.2* 4.4 4.4 4.2 4.4  CL 113* 111 108 105 105 105 104  CO2 18* 20* 20* 21* 21* 20* 21*  GLUCOSE 90 115* 102* 80 95 116* 93  BUN 70* 55* 50* 43* 36* 27* 24*  CREATININE 2.89* 2.53* 2.52* 2.61* 2.49* 2.23* 2.46*  CALCIUM 10.0 9.9 9.9 9.5 9.8 9.7 9.6  MG 2.5* 2.4 2.3  --   --   --   --   PHOS 4.5 4.2 4.0  --   --   --   --    Liver Function Tests: Recent Labs  Lab 09/13/18 0641 09/14/18 0431 09/15/18 0500  AST 19 19 18   ALT 12 12 13   ALKPHOS 46 47 44  BILITOT 0.9 0.8 0.8  PROT 6.6 6.7 6.2*  ALBUMIN 2.8* 2.9* 2.8*   No results for input(s): LIPASE, AMYLASE in the last 168 hours. No results for input(s): AMMONIA in the last 168 hours. CBC: Recent Labs  Lab 09/13/18 0641 09/14/18 0431 09/15/18 0500 09/16/18 0520  WBC 10.5 10.4 14.3* 11.4*  NEUTROABS 7.8* 8.6* 12.4*  --   HGB 10.1* 10.1* 9.6* 9.4*  HCT 30.4* 30.5* 29.7* 29.1*  MCV 94.4 96.2 98.0 97.7  PLT 300 291 259 250   Cardiac Enzymes: No results for input(s): CKTOTAL, CKMB, CKMBINDEX, TROPONINI in the last 168 hours. BNP: BNP (last 3 results) No results for input(s): BNP in the last 8760 hours.  ProBNP (last 3 results) No results for input(s): PROBNP in the last 8760 hours.  CBG: No results for input(s): GLUCAP in the last 168 hours.     Signed:  Kayleen Memos, MD [facility] 09/19/2018, 11:15 AM

## 2018-09-19 NOTE — Plan of Care (Signed)
Patient has adequately met goals of care for discharge. Rolling walker delivered to room. Patient to discharge with family.

## 2018-09-19 NOTE — Final Consult Note (Signed)
Consultant Final Sign-Off Note    Assessment/Final recommendations  Jacob [name] is a 73 y.o. 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 Some mild pain with eating. No nausea or vomiting   Wound care (if applicable):    Diet at discharge: recommend soft diet for a few days then reg diet, eat small frequent meals and avoid large meals   Activity at discharge: per primary team   Follow-up appointment:  None needed   Pending results:  Unresulted Labs (From admission, onward)   None       Medication recommendations: bowel regiment to keep stools soft and regular, avoid constipation   Other recommendations:    Thank you for allowing [place] to participate in the care of your patient!  Please consult [place] again if you have further needs for your patient.  [name] Burnard Enis 09/19/2018 8:14 AM    Subjective   CC: abdominal pain in mid abdomen with food. Still having BM's and flatus and not having nausea or vomiting. Discussed small frequent meals and avoiding large meals. Keep bowels regular and soft.   Objective  Vital signs in last 24 hours: Temp:  [98.2 F (36.8 C)-99 F (37.2 C)] 98.7 F (37.1 C) (09/06 0712) Pulse Rate:  [78-84] 81 (09/06 0712) Resp:  [16-18] 16 (09/06 0712) BP: (133-160)/(87-98) 133/87 (09/06 0712) SpO2:  [96 %-100 %] 96 % (09/06 5188)  PE: Gen: Alert, NAD, pleasant Pulm: rate and effort normal Abd: Soft, obese, non-tender,non-distended,+BS, midline scar noted Skin: warm and dry, no rashes  Psych: A&Ox3   Pertinent labs and Studies: No results for input(s): WBC, HGB, HCT in the last 72 hours. BMET Recent Labs    09/18/18 0504 09/19/18 0643  NA 133* 134*  K 4.2 4.4  CL 105 104  CO2 20* 21*  GLUCOSE 116* 93  BUN 27* 24*  CREATININE 2.23* 2.46*  CALCIUM 9.7 9.6   No results for input(s): LABURIN in the last 72 hours. Results for orders placed or performed during the  hospital encounter of 09/09/18  SARS Coronavirus 2 [facility] order, Performed in [facility] hospital lab) Nasopharyngeal Nasopharyngeal Swab     Status: None   Collection Time: 09/09/18  8:10 AM   Specimen: Nasopharyngeal Swab  Result Value Ref Range Status   SARS Coronavirus 2 NEGATIVE NEGATIVE Final    Comment: (NOTE) If result is NEGATIVE SARS-CoV-2 target nucleic acids are NOT DETECTED. The SARS-CoV-2 RNA is generally detectable in upper and lower  respiratory specimens during the acute phase of infection. The lowest  concentration of SARS-CoV-2 viral copies this assay can detect is 250  copies / mL. A negative result does not preclude SARS-CoV-2 infection  and should not be used as the sole basis for treatment or other  patient management decisions.  A negative result may occur with  improper specimen collection / handling, submission of specimen other  than nasopharyngeal swab, presence of viral mutation(s) within the  areas targeted by this assay, and inadequate number of viral copies  (<250 copies / mL). A negative result must be combined with clinical  observations, patient history, and epidemiological information. If result is POSITIVE SARS-CoV-2 target nucleic acids are DETECTED. The SARS-CoV-2 RNA is generally detectable in upper and lower  respiratory specimens dur ing the acute phase of infection.  Positive  results are indicative of active infection with SARS-CoV-2.  Clinical  correlation with patient history and other diagnostic information  is  necessary to determine patient infection status.  Positive results do  not rule out bacterial infection or co-infection with other viruses. If result is PRESUMPTIVE POSTIVE SARS-CoV-2 nucleic acids MAY BE PRESENT.   A presumptive positive result was obtained on the submitted specimen  and confirmed on repeat testing.  While 2019 novel coronavirus  (SARS-CoV-2) nucleic acids may be present in the submitted sample  additional  confirmatory testing may be necessary for epidemiological  and / or clinical management purposes  to differentiate between  SARS-CoV-2 and other Sarbecovirus currently known to infect humans.  If clinically indicated additional testing with an alternate test  methodology [phone]) is advised. The SARS-CoV-2 RNA is generally  detectable in upper and lower respiratory sp ecimens during the acute  phase of infection. The expected result is Negative. Fact Sheet for Patients:  StrictlyIdeas.no Fact Sheet for Healthcare Providers: BankingDealers.co.za This test is not yet approved or cleared by the [place] FDA and has been authorized for detection and/or diagnosis of SARS-CoV-2 by FDA under an Emergency Use Authorization (EUA).  This EUA will remain in effect (meaning this test can be used) for the duration of the COVID-19 declaration under Section 564(b)(1) of the Act, 21 U.S.C. section 360bbb-3(b)(1), unless the authorization is terminated or revoked sooner. Performed at [facility], [place] [address] [place], [place] [address]   Urine culture     Status: Abnormal   Collection Time: 09/09/18 10:03 AM   Specimen: Urine, Clean Catch  Result Value Ref Range Status   Specimen Description URINE, CLEAN CATCH  Final   Special Requests   Final    NONE Performed at [facility], [place] [address], [place], [place] [address]    Culture 20,000 COLONIES/mL PROTEUS MIRABILIS (A)  Final   Report Status 09/11/2018 FINAL  Final   Organism ID, Bacteria PROTEUS MIRABILIS (A)  Final      Susceptibility   Proteus mirabilis - MIC*    AMPICILLIN <=2 SENSITIVE Sensitive     CEFAZOLIN <=4 SENSITIVE Sensitive     CEFTRIAXONE <=1 SENSITIVE Sensitive     CIPROFLOXACIN <=0.25 SENSITIVE Sensitive     GENTAMICIN <=1 SENSITIVE Sensitive     IMIPENEM 2 SENSITIVE Sensitive     NITROFURANTOIN 128 RESISTANT Resistant     TRIMETH/SULFA <=20 SENSITIVE  Sensitive     AMPICILLIN/SULBACTAM <=2 SENSITIVE Sensitive     PIP/TAZO <=4 SENSITIVE Sensitive     * 20,000 COLONIES/mL PROTEUS MIRABILIS  Blood Culture (routine x 2)     Status: None   Collection Time: 09/09/18 11:20 AM   Specimen: BLOOD  Result Value Ref Range Status   Specimen Description BLOOD LEFT ANTECUBITAL  Final   Special Requests   Final    BOTTLES DRAWN AEROBIC AND ANAEROBIC Blood Culture adequate volume   Culture   Final    NO GROWTH 5 DAYS Performed at [facility], [address], [place], [place] [address]    Report Status 09/14/2018 FINAL  Final  Blood Culture (routine x 2)     Status: None   Collection Time: 09/09/18 11:22 AM   Specimen: BLOOD LEFT HAND  Result Value Ref Range Status   Specimen Description BLOOD LEFT HAND  Final   Special Requests   Final    BOTTLES DRAWN AEROBIC ONLY Blood Culture results may not be optimal due to an inadequate volume of blood received in culture bottles   Culture   Final    NO  GROWTH 5 DAYS Performed at [facility], [place] [address] [place], [place] [address]    Report Status 09/14/2018 FINAL  Final  Surgical PCR screen     Status: None   Collection Time: 09/18/18  5:04 AM   Specimen: Nasal Mucosa; Nasal Swab  Result Value Ref Range Status   MRSA, PCR NEGATIVE NEGATIVE Final   Staphylococcus aureus NEGATIVE NEGATIVE Final    Comment: (NOTE) The Xpert SA Assay (FDA approved for NASAL specimens in patients 26 years of age and older), is one component of a comprehensive surveillance program. It is not intended to diagnose infection nor to guide or monitor treatment. Performed at [facility], [place] [address] [place], [place] [address]     Imaging: No results found.

## 2018-09-19 NOTE — Care Management (Signed)
Referral for [facility] services accepted by [facility]. RW to be delivered to room prior to DC. No other CM needs.

## 2018-09-19 NOTE — Discharge Instructions (Signed)
Bowel Obstruction °A bowel obstruction means that something is blocking the small or large bowel. The bowel is also called the intestine. It is the long tube that connects the stomach to the opening of the butt (anus). When something blocks the bowel, food and fluids cannot pass through like normal. This condition needs to be treated. Treatment depends on the cause of the problem and how bad the problem is. °What are the causes? °Common causes of this condition include: °· Scar tissue (adhesions) from past surgery or from high-energy X-rays (radiation). °· Recent surgery in the belly. This affects how food moves in the bowel. °· Some diseases, such as: °? Irritation of the lining of the digestive tract (Crohn's disease). °? Irritation of small pouches in the bowel (diverticulitis). °· Growths or tumors. °· A bulging organ (hernia). °· Twisting of the bowel (volvulus). °· A foreign body. °· Slipping of a part of the bowel into another part (intussusception). °What are the signs or symptoms? °Symptoms of this condition include: °· Pain in the belly. °· Feeling sick to your stomach (nauseous). °· Throwing up (vomiting). °· Bloating in the belly. °· Being unable to pass gas. °· Trouble pooping (constipation). °· Watery poop (diarrhea). °· A lot of belching. °How is this diagnosed? °This condition may be diagnosed based on: °· A physical exam. °· Medical history. °· Imaging tests, such as X-ray or CT scan. °· Blood tests. °· Urine tests. °How is this treated? °Treatment for this condition may include: °· Fluids and pain medicines that are given through an IV tube. Your doctor may tell you not to eat or drink if you feel sick to your stomach and are throwing up. °· Eating a clear liquid diet for a few days. °· Putting a small tube (nasogastric tube) into the stomach. This will help with pain, discomfort, and nausea by removing blocked air and fluids from the stomach. °· Surgery. This may be needed if other treatments do  not work. °Follow these instructions at home: °Medicines °· Take over-the-counter and prescription medicines only as told by your doctor. °· If you were prescribed an antibiotic medicine, take it as told by your doctor. Do not stop taking the antibiotic even if you start to feel better. °General instructions °· Follow your diet as told by your doctor. You may need to: °? Only drink clear liquids until you start to get better. °? Avoid solid foods. °· Return to your normal activities as told by your doctor. Ask your doctor what activities are safe for you. °· Do not sit for a long time without moving. Get up to take short walks every 1-2 hours. This is important. Ask for help if you feel weak or unsteady. °· Keep all follow-up visits as told by your doctor. This is important. °How is this prevented? °After having a bowel obstruction, you may be more likely to have another. You can do some things to stop it from happening again. °· If you have a long-term (chronic) disease, contact your doctor if you see changes or problems. °· Take steps to prevent or treat trouble pooping. Your doctor may ask that you: °? Drink enough fluid to keep your pee (urine) pale yellow. °? Take over-the-counter or prescription medicines. °? Eat foods that are high in fiber. These include beans, whole grains, and fresh fruits and vegetables. °? Limit foods that are high in fat and sugar. These include fried or sweet foods. °· Stay active. Ask your doctor which exercises are   safe for you.  Avoid stress.  Eat three small meals and three small snacks each day.  Work with a Publishing rights manager (dietitian) to make a meal plan that works for you.  Do not use any products that contain nicotine or tobacco, such as cigarettes and e-cigarettes. If you need help quitting, ask your doctor. Contact a doctor if:  You have a fever.  You have chills. Get help right away if:  You have pain or cramps that get worse.  You throw up blood.  You are  sick to your stomach.  You cannot stop throwing up.  You cannot drink fluids.  You feel mixed up (confused).  You feel very thirsty (dehydrated).  Your belly gets more bloated.  You feel weak or you pass out (faint). Summary  A bowel obstruction means that something is blocking the small or large bowel.  Treatment may include IV fluids and pain medicine. You may also have a clear liquid diet, a small tube in your stomach, or surgery.  Drink clear liquids and avoid solid foods until you get better. This information is not intended to replace advice given to you by your health care provider. Make sure you discuss any questions you have with your health care provider. Document Released: 02/07/2004 Document Revised: 05/13/2017 Document Reviewed: 05/13/2017 Elsevier Patient Education  [place] Pneumonia, Adult Pneumonia is an infection of the lungs. It causes swelling in the airways of the lungs. Mucus and fluid may also build up inside the airways. One type of pneumonia can happen while a person is in a hospital. A different type can happen when a person is not in a hospital (community-acquired pneumonia).  What are the causes?  This condition is caused by germs (viruses, bacteria, or fungi). Some types of germs can be passed from one person to another. This can happen when you breathe in droplets from the cough or sneeze of an infected person. What increases the risk? You are more likely to develop this condition if you:  Have a long-term (chronic) disease, such as: ? Chronic obstructive pulmonary disease (COPD). ? Asthma. ? Cystic fibrosis. ? Congestive heart failure. ? Diabetes. ? Kidney disease.  Have HIV.  Have sickle cell disease.  Have had your spleen removed.  Do not take good care of your teeth and mouth (poor dental hygiene).  Have a medical condition that increases the risk of breathing in droplets from your own mouth and  nose.  Have a weakened body defense system (immune system).  Are a smoker.  Travel to areas where the germs that cause this illness are common.  Are around certain animals or the places they live. What are the signs or symptoms?  A dry cough.  A wet (productive) cough.  Fever.  Sweating.  Chest pain. This often happens when breathing deeply or coughing.  Fast breathing or trouble breathing.  Shortness of breath.  Shaking chills.  Feeling tired (fatigue).  Muscle aches. How is this treated? Treatment for this condition depends on many things. Most adults can be treated at home. In some cases, treatment must happen in a hospital. Treatment may include:  Medicines given by mouth or through an IV tube.  Being given extra oxygen.  Respiratory therapy. In rare cases, treatment for very bad pneumonia may include:  Using a machine to help you breathe.  Having a procedure to remove fluid from around your lungs. Follow these instructions at home: Medicines  Take over-the-counter and  prescription medicines only as told by your doctor. ? Only take cough medicine if you are losing sleep.  If you were prescribed an antibiotic medicine, take it as told by your doctor. Do not stop taking the antibiotic even if you start to feel better. General instructions   Sleep with your head and neck raised (elevated). You can do this by sleeping in a recliner or by putting a few pillows under your head.  Rest as needed. Get at least 8 hours of sleep each night.  Drink enough water to keep your pee (urine) pale yellow.  Eat a healthy diet that includes plenty of vegetables, fruits, whole grains, low-fat dairy products, and lean protein.  Do not use any products that contain nicotine or tobacco. These include cigarettes, e-cigarettes, and chewing tobacco. If you need help quitting, ask your doctor.  Keep all follow-up visits as told by your doctor. This is important. How is this  prevented? A shot (vaccine) can help prevent pneumonia. Shots are often suggested for:  People older than 74 years of age.  People older than 73 years of age who: ? Are having cancer treatment. ? Have long-term (chronic) lung disease. ? Have problems with their body's defense system. You may also prevent pneumonia if you take these actions:  Get the flu (influenza) shot every year.  Go to the dentist as often as told.  Wash your hands often. If you cannot use soap and water, use hand sanitizer. Contact a doctor if:  You have a fever.  You lose sleep because your cough medicine does not help. Get help right away if:  You are short of breath and it gets worse.  You have more chest pain.  Your sickness gets worse. This is very serious if: ? You are an older adult. ? Your body's defense system is weak.  You cough up blood. Summary  Pneumonia is an infection of the lungs.  Most adults can be treated at home. Some will need treatment in a hospital.  Drink enough water to keep your pee pale yellow.  Get at least 8 hours of sleep each night. This information is not intended to replace advice given to you by your health care provider. Make sure you discuss any questions you have with your health care provider. Document Released: 06/18/2007 Document Revised: 04/21/2018 Document Reviewed: 08/27/2017 Elsevier Patient Education  2020 [employer].   Ileus  Ileus is a condition that happens when the intestines, which are also called bowels, stop working correctly. The intestines are hollow organs that digest food after the food leaves the stomach. These organs are long, muscular tubes that connect the stomach to the rectum. When ileus occurs, the muscular contractions that cause food to move through the intestines do not happen as they normally would. If the intestines stop working, food cannot pass through to get digested. This condition is a serious problem that usually requires  hospitalization. It can cause symptoms such as nausea, abdominal pain, and bloating. Ileus can last from a few hours to a few days. What are the causes? This condition may be caused by:  Surgery on the abdomen.  An infection or inflammation in the abdomen. This includes inflammation of the lining of the abdomen (peritonitis).  Infection or inflammation in other parts of the body, such as pneumonia or pancreatitis.  Passage of gallstones or kidney stones.  Damage to the nerves or blood vessels that go to the intestines.  A collection of blood within the  abdominal cavity.  Imbalance in the salts in the blood (electrolytes).  Injury to the brain or spinal cord.  Medicines. Many medicines, including strong pain medicines, can cause ileus or make it worse. If the intestines stop working because of a blockage, that is a different condition that is called a bowel obstruction. What are the signs or symptoms? Symptoms of this condition include:  Bloating of the abdomen.  Pain or discomfort in the abdomen.  Poor appetite.  Nausea and vomiting.  Lack of normal bowel sounds, such as "growling" in the stomach. How is this diagnosed? This condition may be diagnosed with:  A physical exam and medical history.  X-rays or a CT scan of the abdomen. You may also have other tests to help find the cause of the condition. How is this treated? This condition may be treated by:  Resting the intestines until they start to work again. This is often done by: ? Stopping oral intake of food and drink. You will be given fluid through an IV to prevent dehydration. ? Placing a small tube (nasogastric tube or NG tube) that is passed through your nose and into your stomach. The tube is attached to a suction device and keeps the stomach emptied out. This allows the bowels to rest and helps to reduce nausea and vomiting.  Correcting any electrolyte imbalance by giving supplements in the IV  fluid.  Stopping any medicines that might make ileus worse.  Treating any condition that may have caused ileus. Follow these instructions at home: Eating and drinking   Follow instructions from your health care provider about: ? What to eat and drink. You may be told to start eating a bland diet. Over time, you may slowly resume a more normal, healthy diet. ? How much to eat and drink. You should eat small meals often and stop eating when you feel full.  Avoid alcohol. General instructions  Take over-the-counter and prescription medicines only as told by your health care provider.  Rest as told by your health care provider.  Avoid sitting for a long time without moving. Get up to take short walks every 1-2 hours. Ask for help if you feel weak or unsteady.  Keep all follow-up visits as told by your health care provider. This is important. Contact a health care provider if:  You have nausea, vomiting, or abdominal discomfort.  You have a fever. Get help right away if:  You have severe abdominal pain or bloating.  You cannot eat or drink without vomiting. Summary  Ileus is a condition that happens when the intestines, which are also called bowels, stop working correctly.  When ileus occurs, the muscular contractions that cause food to move through the intestines do not happen as they normally would.  Ileus can cause symptoms such as nausea, abdominal pain, and bloating.  Treatment may involve getting IV fluids and having a nasogastric tube placed to keep your stomach emptied out until the intestines start working again. This information is not intended to replace advice given to you by your health care provider. Make sure you discuss any questions you have with your health care provider. Document Released: 01/02/2003 Document Revised: 04/27/2017 Document Reviewed: 04/27/2017 Elsevier Patient Education  [place].

## 2018-09-22 ENCOUNTER — Telehealth: Payer: Self-pay

## 2018-09-22 NOTE — Telephone Encounter (Signed)
Received message from patient stating that he was just dc'd from the hospital and was told to make a follow up appt. Per Dr. Alen Blew the patient does not need an appointment any sooner than is already scheduled. Called patient back and left message with scheduled appointments.

## 2018-10-01 ENCOUNTER — Ambulatory Visit: Payer: Medicare Other | Admitting: Nurse Practitioner

## 2018-10-08 ENCOUNTER — Ambulatory Visit (INDEPENDENT_AMBULATORY_CARE_PROVIDER_SITE_OTHER): Payer: Medicare Other | Admitting: Nurse Practitioner

## 2018-10-08 ENCOUNTER — Encounter: Payer: Self-pay | Admitting: Nurse Practitioner

## 2018-10-08 ENCOUNTER — Other Ambulatory Visit (INDEPENDENT_AMBULATORY_CARE_PROVIDER_SITE_OTHER): Payer: Medicare Other

## 2018-10-08 VITALS — BP 104/70 | HR 62 | Temp 99.0°F | Ht 74.0 in | Wt 229.0 lb

## 2018-10-08 DIAGNOSIS — D539 Nutritional anemia, unspecified: Secondary | ICD-10-CM | POA: Diagnosis not present

## 2018-10-08 DIAGNOSIS — Z85028 Personal history of other malignant neoplasm of stomach: Secondary | ICD-10-CM

## 2018-10-08 LAB — FOLATE: Folate: 6.5 ng/mL (ref >5.9)

## 2018-10-08 LAB — HEMATOCRIT: HCT: 27.5 % — ABNORMAL LOW (ref 39.0–52.0)

## 2018-10-08 LAB — VITAMIN B12: Vitamin B-12: 406 pg/mL (ref 211–911)

## 2018-10-08 LAB — HEMOGLOBIN: Hemoglobin: 9.2 g/dL — ABNORMAL LOW (ref 13.0–17.0)

## 2018-10-08 NOTE — Patient Instructions (Signed)
Your provider has requested that you go to the basement level for lab work before leaving today. Press "B" on the elevator. The lab is located at the first door on the left as you exit the elevator.  We will call you with the results and proceed from there

## 2018-10-08 NOTE — Progress Notes (Signed)
Chief Complaint:   Recent SBO   IMPRESSION and PLAN:    32.  73 year old male recently hospitalized with SBO versus ileus secondary to pneumonia.  Obstructive process resolved spontaneously, surgery was following.  If this was SBO then suspected could be secondary to adhesions.  Patient is status post Billroth II in 2017 for history of gastric cancer.  No recurrence of cancer or any significant findings on last EGD June 2019.   2.  Chronic anemia, likely secondary to chronic disease.  His hemoglobin has dropped some from baseline.  It was in the low 9 range during recent hospital admission which is really not too far from baseline.  However repeat CBC by PCP earlier this month was remarkable for hemoglobin of 8.6.  No overt bleeding.  Iron studies do not suggest iron deficiency.  He is Hemoccult negative today -Repeat H&H, further recommendations depending on results.  Of note he is up-to-date on polyp surveillance colonoscopy, not due again until June 2021 -Sstarted on Protonix in the hospital, he should continue that for now  HPI:     Patient is a 73 year old male with PMH significant for gastric cancer status post distal gastrectomy  / Bilroth II in 2017 , hx of colon polyps, hypertension, monoclonal gammopathy of uncertain significance, CKD, recent pneumonia, alcohol abuse which is in remission, and  obesity.     Jacob [name] was admitted to the hospital late August with nausea and vomiting and PNA.   CTAP suggested SBO with transition in mid ileal region.  NG tube was placed, patient  followed by Surgery.  Symptoms and radiographic findings eventually improved.  Patient saw his PCP 09/27/2018 for hospital follow-up.  He reported having trouble swallowing solids .  Patient says he could hardly swallow anything for couple of weeks but after completion of antibiotics for pneumonia everything returned to normal.  During that time he was having swallowing problems he was also having loose  stool.  When able to swallow again and eat solid foods the diarrhea resolved.   Post hospital labs drawn by PCP reveal hemoglobin of 8.8 on 09/27/2018.  MCV 101.  BUN 26 and creatinine 3.  Total iron binding capacity low at 208, iron saturation low at 10%.  She has chronic anemia, likely secondary to CKD.  Baseline hemoglobin in the mid 9-10 range.  In the hospital hemoglobin was around 9.2.  Ferritin at that time 396.  Patient denies any overt GI blood loss.  Denies NSAID use.  No abdominal pain nor nausea/vomiting since hospital discharge.  Data Reviewed:  EGD Normal esophagus. - Patent Billroth II gastrojejunostomy was found, characterized by healthy appearing mucosa. - A single gastric polyp at anastomosis. Biopsied. - Gastritis. Biopsied. - Normal appearing afferent and efferent limbs  Colonoscopy Preparation of the colon was fair. - One 4 mm polyp in the cecum, removed with a cold biopsy forceps. Resected and retrieved. - Two 6 to 8 mm polyps in the descending colon and in the transverse colon, removed with a cold snare. Resected and retrieved. - Moderate diverticulosis in the left colon. There was no evidence of diverticular bleeding. - Internal hemorrhoids. - The examination was otherwise normal on direct and retroflexion views.  Surgical [P], cecum and transverse and descending, polyp (3) - MULTIPLE FRAGMENTS OF TUBULAR ADENOMA(S) - NO HIGH GRADE DYSPLASIA OR MALIGNANCY IDENTIFIED 2. Surgical [P], nodule at the anastomosis site - HYPERPLASTIC GASTRIC MUCOSA WITH ULCERATION AND GRANULATION TISSUE - NO HIGH GRADE  DYSPLASIA OR MALIGNANCY IDENTIFIED 3. Surgical [P], gastric fundus and body - MILD CHRONIC GASTRITIS WITH REACTIVE CHANGES AND INTESTINAL METAPLASIA - NO H. PYLORI IDENTIFIED - SEE COMMENT    Review of systems:     No chest pain, no SOB, no fevers, no urinary sx   Past Medical History:  Diagnosis Date   Anemia    hx iron deficiency   Anxiety    Arthritis     gout   Back pain    BPH with obstruction/lower urinary tract symptoms    Chronic kidney disease    "they said I do"   Constipation    Depression    ED (erectile dysfunction)    Epigastric pain    GERD (gastroesophageal reflux disease)    Heartburn    History of blood transfusion    History of radiation therapy 11/03/11-12/29/11   prostate   Hypercholesterolemia    Hypertension    Night sweats    Post-operative nausea and vomiting 04/09/2016   Prostate cancer ([place]) 08/14/11   Adenocarcinoma,gleason:3+3=6,& 3+4=7,PSA=5.66   PUD (peptic ulcer disease)    Sleep apnea    does not use Cpap but does use O2 @ 2l    Stomach cancer ([place])    Ulcer    peptic ulcer hx    Patient's surgical history, family medical history, social history, medications and allergies were all reviewed in Epic   Serum creatinine: 2.46 mg/dL (H) 09/19/18 0938 Estimated creatinine clearance: 34.9 mL/min (A)  Current Outpatient Medications  Medication Sig Dispense Refill   acetaminophen (TYLENOL) 500 MG tablet Take 1,000 mg by mouth daily as needed for moderate pain or headache.     buPROPion (WELLBUTRIN XL) 300 MG 24 hr tablet Take 300 mg by mouth daily.      calcium carbonate (TUMS - DOSED IN MG ELEMENTAL CALCIUM) 500 MG chewable tablet Chew 3 tablets by mouth 2 (two) times daily as needed for indigestion or heartburn.     furosemide (LASIX) 40 MG tablet Take 80 mg by mouth every other day.      gabapentin (NEURONTIN) 300 MG capsule Take 1 capsule (300 mg total) by mouth at bedtime. 10 capsule 0   hydrALAZINE (APRESOLINE) 25 MG tablet Take 25 mg by mouth 2 (two) times daily.     Melatonin 10 MG TABS Take 1 tablet by mouth at bedtime as needed (sleep).      Nebivolol HCl (BYSTOLIC) 20 MG TABS Take 20 mg by mouth daily.      pantoprazole (PROTONIX) 40 MG tablet Take 1 tablet (40 mg total) by mouth daily before supper. 30 tablet 0   polycarbophil (FIBERCON) 625 MG tablet Take 1 tablet  (625 mg total) by mouth daily. 30 tablet 3   polyethylene glycol (MIRALAX / GLYCOLAX) 17 g packet Take 17 g by mouth 2 (two) times daily. 14 each 0   prochlorperazine (COMPAZINE) 10 MG tablet Take 1 tablet (10 mg total) by mouth every 6 (six) hours as needed for nausea or vomiting. 30 tablet 1   tamsulosin (FLOMAX) 0.4 MG CAPS capsule TAKE 1 CAPSULE BY MOUTH DAILY (Patient taking differently: Take 0.4 mg by mouth daily after supper. ) 30 capsule 2   Verapamil HCl CR 300 MG CP24 Take 300 mg by mouth at bedtime.      No current facility-administered medications for this visit.     Physical Exam:     BP 104/70 (BP Location: Left Arm, Patient Position: Sitting, Cuff Size: Large)  Pulse 62    Temp 99 F (37.2 C) (Oral)    Ht 6\' 2"  (1.88 m)    Wt 229 lb (103.9 kg)    BMI 29.40 kg/m   GENERAL:  Pleasant  male in NAD PSYCH: : Cooperative, normal affect EENT:  conjunctiva pink, mucous membranes moist, neck supple without masses CARDIAC:  RRR,  no peripheral edema PULM: Normal respiratory effort, lungs CTA bilaterally, no wheezing ABDOMEN:  Nondistended, soft, nontender. No obvious masses, no hepatomegaly,  normal bowel sounds SKIN:  turgor, no lesions seen Musculoskeletal:  Normal muscle tone, normal strength NEURO: Alert and oriented x 3, no focal neurologic deficits  I spent 25 minutes of face-to-face time with the patient. Greater than 50% of the time was spent counseling and coordinating care. Questions answered  Jacob [name] , NP 10/08/2018, 9:48 AM

## 2018-10-09 NOTE — Progress Notes (Signed)
Reviewed and agree with management plan.  Mckinsley Koelzer T. Seydou Hearns, MD FACG [facility]  

## 2018-10-11 ENCOUNTER — Telehealth: Payer: Self-pay | Admitting: Nurse Practitioner

## 2018-10-11 NOTE — Telephone Encounter (Signed)
See results note 9/28

## 2018-10-11 NOTE — Telephone Encounter (Signed)
Pt inquired about test results.

## 2018-10-22 ENCOUNTER — Inpatient Hospital Stay: Payer: Medicare Other | Attending: Oncology

## 2018-10-22 ENCOUNTER — Other Ambulatory Visit: Payer: Self-pay

## 2018-10-22 DIAGNOSIS — Z95828 Presence of other vascular implants and grafts: Secondary | ICD-10-CM

## 2018-10-22 DIAGNOSIS — N179 Acute kidney failure, unspecified: Secondary | ICD-10-CM | POA: Insufficient documentation

## 2018-10-22 DIAGNOSIS — D649 Anemia, unspecified: Secondary | ICD-10-CM | POA: Diagnosis not present

## 2018-10-22 DIAGNOSIS — D631 Anemia in chronic kidney disease: Secondary | ICD-10-CM

## 2018-10-22 DIAGNOSIS — N189 Chronic kidney disease, unspecified: Secondary | ICD-10-CM

## 2018-10-22 DIAGNOSIS — D472 Monoclonal gammopathy: Secondary | ICD-10-CM | POA: Diagnosis not present

## 2018-10-22 DIAGNOSIS — Z8500 Personal history of malignant neoplasm of unspecified digestive organ: Secondary | ICD-10-CM | POA: Diagnosis present

## 2018-10-22 DIAGNOSIS — Z452 Encounter for adjustment and management of vascular access device: Secondary | ICD-10-CM | POA: Diagnosis present

## 2018-10-22 MED ORDER — SODIUM CHLORIDE 0.9% FLUSH
10.0000 mL | Freq: Once | INTRAVENOUS | Status: AC
  Administered 2018-10-22: 10 mL
  Filled 2018-10-22: qty 10

## 2018-10-22 MED ORDER — HEPARIN SOD (PORK) LOCK FLUSH 100 UNIT/ML IV SOLN
500.0000 [IU] | Freq: Once | INTRAVENOUS | Status: AC
  Administered 2018-10-22: 500 [IU]
  Filled 2018-10-22: qty 5

## 2018-11-02 ENCOUNTER — Other Ambulatory Visit: Payer: Self-pay

## 2018-11-02 ENCOUNTER — Encounter: Payer: Self-pay | Admitting: Podiatry

## 2018-11-02 ENCOUNTER — Ambulatory Visit: Payer: Medicare Other | Admitting: Podiatry

## 2018-11-02 DIAGNOSIS — G609 Hereditary and idiopathic neuropathy, unspecified: Secondary | ICD-10-CM | POA: Diagnosis not present

## 2018-11-02 DIAGNOSIS — L84 Corns and callosities: Secondary | ICD-10-CM | POA: Diagnosis not present

## 2018-11-02 DIAGNOSIS — M79674 Pain in right toe(s): Secondary | ICD-10-CM | POA: Diagnosis not present

## 2018-11-02 DIAGNOSIS — M79675 Pain in left toe(s): Secondary | ICD-10-CM | POA: Diagnosis not present

## 2018-11-02 DIAGNOSIS — B351 Tinea unguium: Secondary | ICD-10-CM | POA: Diagnosis not present

## 2018-11-02 NOTE — Patient Instructions (Signed)

## 2018-11-04 NOTE — Progress Notes (Signed)
Subjective: Patrich Heinze presents today with history of neuropathy. Patient seen for follow up of chronic, painful mycotic toenails and calluses which interfere with daily activities and routine tasks.  Pain is aggravated when wearing enclosed shoe gear. Pain is getting progressively worse and relieved with periodic professional debridement.   He voices no new pedal complaints on today's visit.  Current Outpatient Medications on File Prior to Visit  Medication Sig Dispense Refill  . acetaminophen (TYLENOL) 500 MG tablet Take 1,000 mg by mouth daily as needed for moderate pain or headache.    Marland Kitchen buPROPion (WELLBUTRIN XL) 300 MG 24 hr tablet Take 300 mg by mouth daily.     . calcium carbonate (TUMS - DOSED IN MG ELEMENTAL CALCIUM) 500 MG chewable tablet Chew 3 tablets by mouth 2 (two) times daily as needed for indigestion or heartburn.    . furosemide (LASIX) 40 MG tablet Take 80 mg by mouth every other day.     . gabapentin (NEURONTIN) 300 MG capsule Take 1 capsule (300 mg total) by mouth at bedtime. 10 capsule 0  . hydrALAZINE (APRESOLINE) 25 MG tablet Take 25 mg by mouth 2 (two) times daily.    . Melatonin 10 MG TABS Take 1 tablet by mouth at bedtime as needed (sleep).     . Nebivolol HCl (BYSTOLIC) 20 MG TABS Take 20 mg by mouth daily.     . pantoprazole (PROTONIX) 40 MG tablet Take 1 tablet (40 mg total) by mouth daily before supper. 30 tablet 0  . polycarbophil (FIBERCON) 625 MG tablet Take 1 tablet (625 mg total) by mouth daily. 30 tablet 3  . polyethylene glycol (MIRALAX / GLYCOLAX) 17 g packet Take 17 g by mouth 2 (two) times daily. 14 each 0  . prochlorperazine (COMPAZINE) 10 MG tablet Take 1 tablet (10 mg total) by mouth every 6 (six) hours as needed for nausea or vomiting. 30 tablet 1  . tamsulosin (FLOMAX) 0.4 MG CAPS capsule TAKE 1 CAPSULE BY MOUTH DAILY (Patient taking differently: Take 0.4 mg by mouth daily after supper. ) 30 capsule 2  . Verapamil HCl CR 300 MG CP24 Take 300  mg by mouth at bedtime.      No current facility-administered medications on file prior to visit.     No Known Allergies  Objective: Vascular Examination: Capillary refill time immediate x 10 digits.  Dorsalis pedis and Posterior tibial pulses present b/l.  No digital hair x 10 digits.  Skin temperature WNL b/l.  Dermatological Examination: Skin with normal turgor, texture and tone b/l.  Toenails 1-5 b/l discolored, thick, dystrophic with subungual debris and pain with palpation to nailbeds due to thickness of nails.  Hyperkeratotic lesion(s) submet head 4 b/l and submet heads 1, 3 right foot. No erythema, no edema, no drainage, no flocculence noted.   Musculoskeletal: Muscle strength 5/5 to all LE muscle groups.  Neurological: Sensation decreased b/l with 10 gram monofilament.  Assessment: 1. Painful onychomycosis toenails 1-5 b/l 2. Calluses submet head 4 b/l and submet heads 1, 3 right foot 3. IdiopathPeripheral neuropathy   Plan: 1. Toenails 1-5 b/l were debrided in length and girth without iatrogenic bleeding. 2. Calluses pared submetatarsal head(s) 4 b/l and submet heads 1, 3 right foot utilizing sterile scalpel blade without incident. 3. Patient to continue soft, supportive shoe gear daily. 4. Patient to report any pedal injuries to medical professional immediately. 5. Follow up 3 months.  6. Patient/POA to call should there be a concern in the interim.

## 2018-12-14 ENCOUNTER — Encounter (HOSPITAL_COMMUNITY): Payer: Self-pay

## 2018-12-14 ENCOUNTER — Other Ambulatory Visit: Payer: Self-pay

## 2018-12-14 ENCOUNTER — Ambulatory Visit (HOSPITAL_COMMUNITY)
Admission: RE | Admit: 2018-12-14 | Discharge: 2018-12-14 | Disposition: A | Discharge status: Home / Self Care | Payer: Medicare Other | Source: Ambulatory Visit | Attending: Oncology | Admitting: Oncology

## 2018-12-14 ENCOUNTER — Inpatient Hospital Stay: Payer: Medicare Other

## 2018-12-14 ENCOUNTER — Inpatient Hospital Stay: Payer: Medicare Other | Attending: Oncology

## 2018-12-14 DIAGNOSIS — I700 Atherosclerosis of aorta: Secondary | ICD-10-CM | POA: Diagnosis not present

## 2018-12-14 DIAGNOSIS — N289 Disorder of kidney and ureter, unspecified: Secondary | ICD-10-CM | POA: Diagnosis not present

## 2018-12-14 DIAGNOSIS — N189 Chronic kidney disease, unspecified: Secondary | ICD-10-CM

## 2018-12-14 DIAGNOSIS — D649 Anemia, unspecified: Secondary | ICD-10-CM | POA: Insufficient documentation

## 2018-12-14 DIAGNOSIS — C169 Malignant neoplasm of stomach, unspecified: Secondary | ICD-10-CM | POA: Insufficient documentation

## 2018-12-14 DIAGNOSIS — Z923 Personal history of irradiation: Secondary | ICD-10-CM | POA: Diagnosis not present

## 2018-12-14 DIAGNOSIS — Z79899 Other long term (current) drug therapy: Secondary | ICD-10-CM | POA: Diagnosis not present

## 2018-12-14 DIAGNOSIS — C61 Malignant neoplasm of prostate: Secondary | ICD-10-CM | POA: Diagnosis not present

## 2018-12-14 DIAGNOSIS — I2510 Atherosclerotic heart disease of native coronary artery without angina pectoris: Secondary | ICD-10-CM | POA: Insufficient documentation

## 2018-12-14 DIAGNOSIS — D472 Monoclonal gammopathy: Secondary | ICD-10-CM | POA: Insufficient documentation

## 2018-12-14 DIAGNOSIS — Z95828 Presence of other vascular implants and grafts: Secondary | ICD-10-CM

## 2018-12-14 DIAGNOSIS — D631 Anemia in chronic kidney disease: Secondary | ICD-10-CM

## 2018-12-14 LAB — CBC WITH DIFFERENTIAL (CANCER CENTER ONLY)
Abs Immature Granulocytes: 0.03 10*3/uL (ref 0.00–0.07)
Basophils Absolute: 0 10*3/uL (ref 0.0–0.1)
Basophils Relative: 1 %
Eosinophils Absolute: 0.3 10*3/uL (ref 0.0–0.5)
Eosinophils Relative: 4 %
HCT: 30.7 % — ABNORMAL LOW (ref 39.0–52.0)
Hemoglobin: 9.6 g/dL — ABNORMAL LOW (ref 13.0–17.0)
Immature Granulocytes: 0 %
Lymphocytes Relative: 16 %
Lymphs Abs: 1.2 10*3/uL (ref 0.7–4.0)
MCH: 31 pg (ref 26.0–34.0)
MCHC: 31.3 g/dL (ref 30.0–36.0)
MCV: 99 fL (ref 80.0–100.0)
Monocytes Absolute: 0.7 10*3/uL (ref 0.1–1.0)
Monocytes Relative: 10 %
Neutro Abs: 5.1 10*3/uL (ref 1.7–7.7)
Neutrophils Relative %: 69 %
Platelet Count: 192 10*3/uL (ref 150–400)
RBC: 3.1 MIL/uL — ABNORMAL LOW (ref 4.22–5.81)
RDW: 15.4 % (ref 11.5–15.5)
WBC Count: 7.5 10*3/uL (ref 4.0–10.5)
nRBC: 0 % (ref 0.0–0.2)

## 2018-12-14 LAB — CMP (CANCER CENTER ONLY)
ALT: 10 U/L (ref 0–44)
AST: 15 U/L (ref 15–41)
Albumin: 3.4 g/dL — ABNORMAL LOW (ref 3.5–5.0)
Alkaline Phosphatase: 48 U/L (ref 38–126)
Anion gap: 10 (ref 5–15)
BUN: 20 mg/dL (ref 8–23)
CO2: 24 mmol/L (ref 22–32)
Calcium: 9.9 mg/dL (ref 8.9–10.3)
Chloride: 106 mmol/L (ref 98–111)
Creatinine: 2.07 mg/dL — ABNORMAL HIGH (ref 0.61–1.24)
GFR, Est AFR Am: 36 mL/min — ABNORMAL LOW (ref >60)
GFR, Estimated: 31 mL/min — ABNORMAL LOW (ref >60)
Glucose, Bld: 87 mg/dL (ref 70–99)
Potassium: 4.5 mmol/L (ref 3.5–5.1)
Sodium: 140 mmol/L (ref 135–145)
Total Bilirubin: 0.5 mg/dL (ref 0.3–1.2)
Total Protein: 6.7 g/dL (ref 6.5–8.1)

## 2018-12-14 IMAGING — CT CT ABD-PELV W/O CM
2 of 4 series · 13 of 36 positions shown, 16 images · non-contrast
Comparison: The [DATE]

CLINICAL DATA: gastric adenocarcinoma restaging.

EXAM:
CT CHEST, ABDOMEN AND PELVIS WITHOUT CONTRAST
TECHNIQUE: Multidetector CT imaging of the chest, abdomen and pelvis was
performed following the standard protocol without IV contrast.

[Series 2: cap w/o · axial · non-contrast · 0.92mm/px · z∈[-669,-119]mm · 10 of 136 slices shown, 13 images]
[im 13/136  mediastinal]
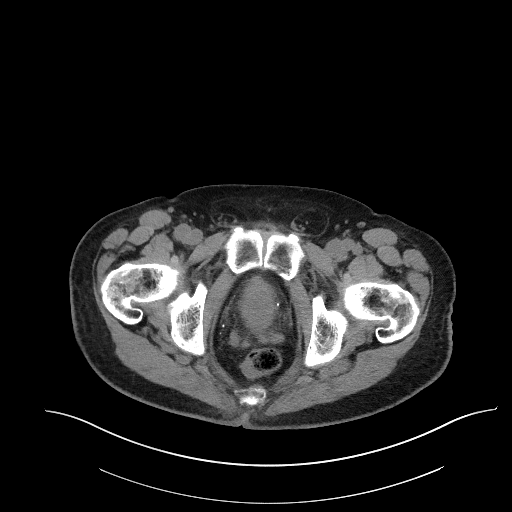
[im 13/136  lung]
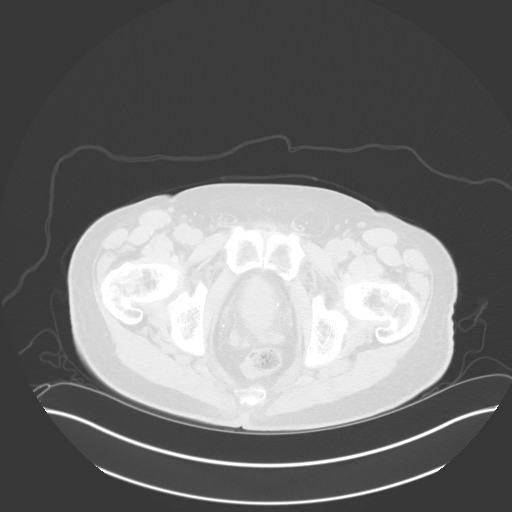
[im 25/136  lung]
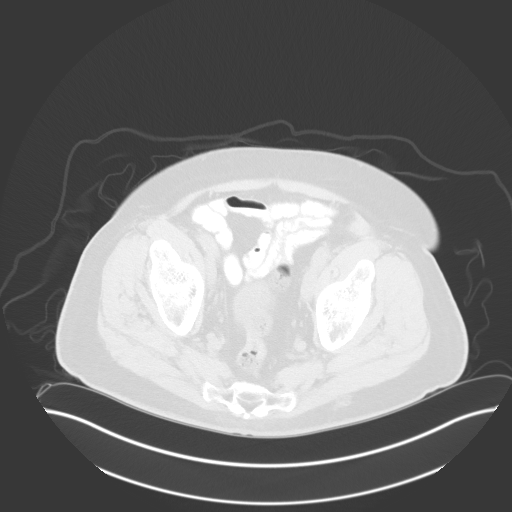
[im 37/136  lung]
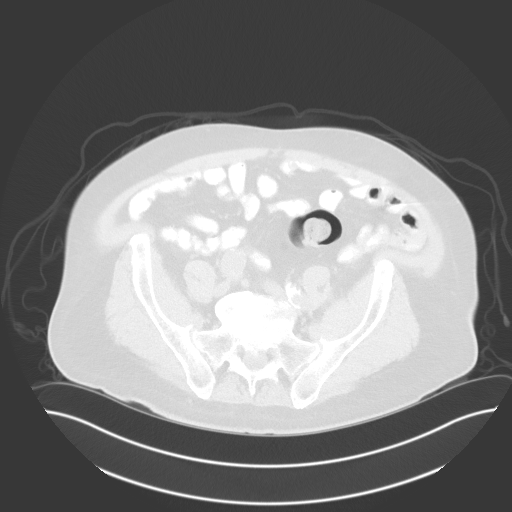
[im 50/136  lung]
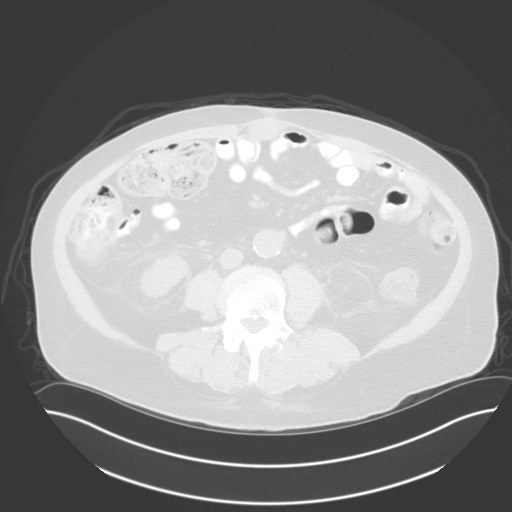
[im 62/136  mediastinal]
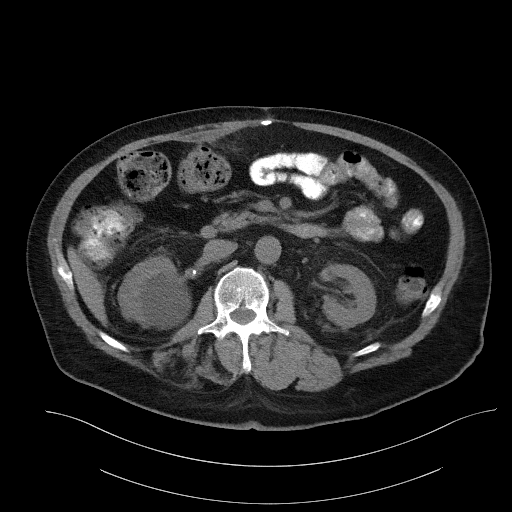
[im 62/136  lung]
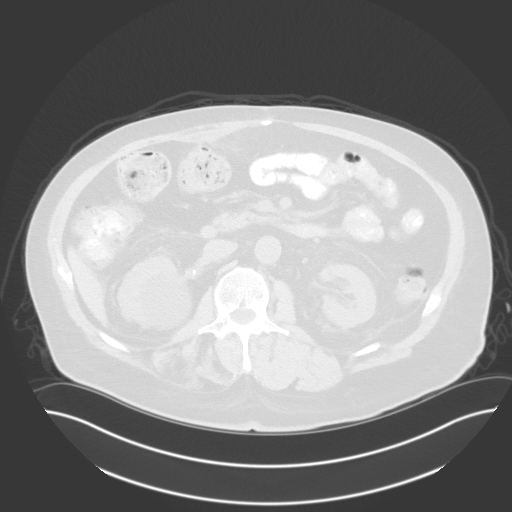
[im 74/136  lung]
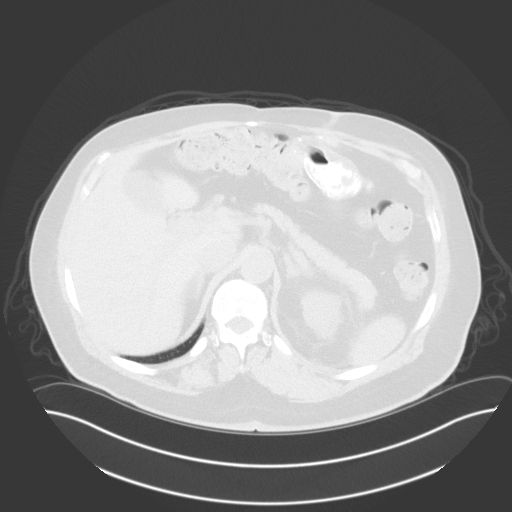
[im 86/136  lung]
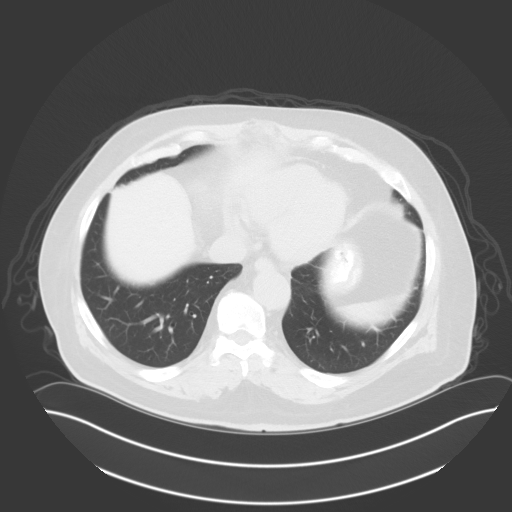
[im 99/136  lung]
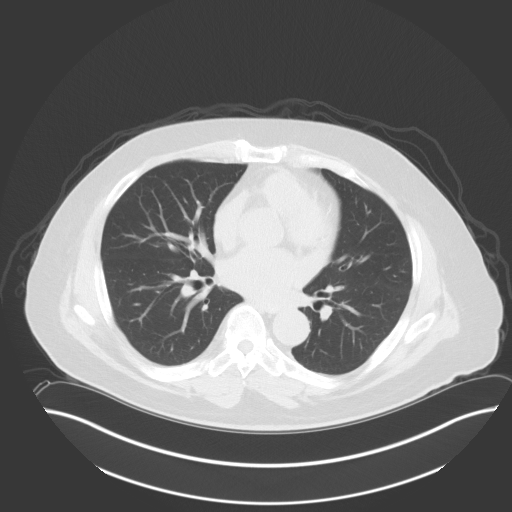
[im 111/136  mediastinal]
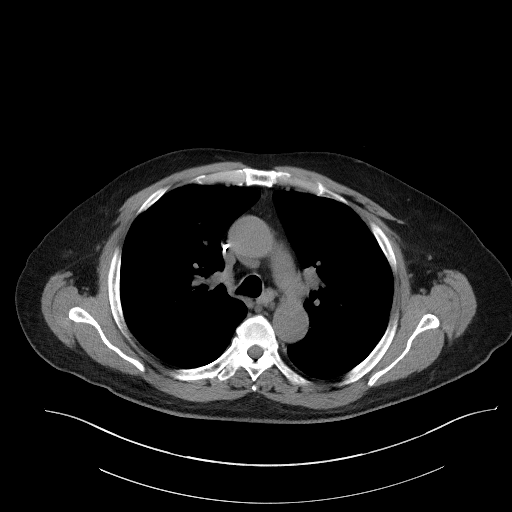
[im 111/136  lung]
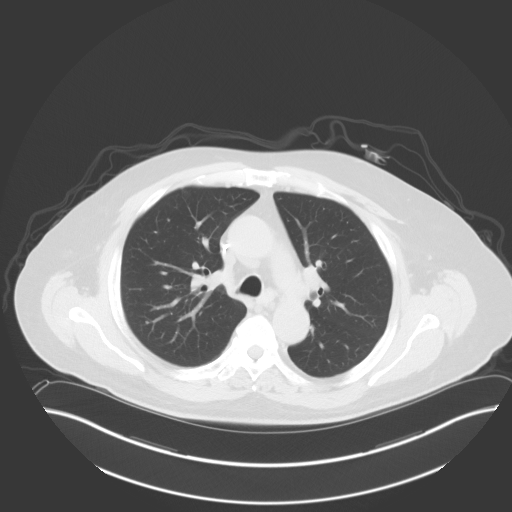
[im 123/136  lung]
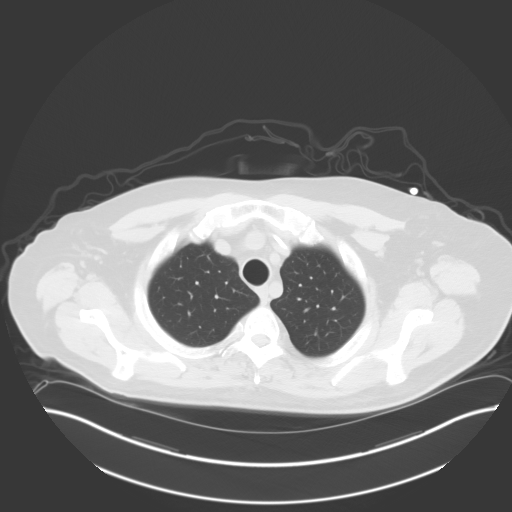

[Series 5: coronals · coronal · 0.82mm/px · 3 of 144 slices shown]
[im 29/144  lung]
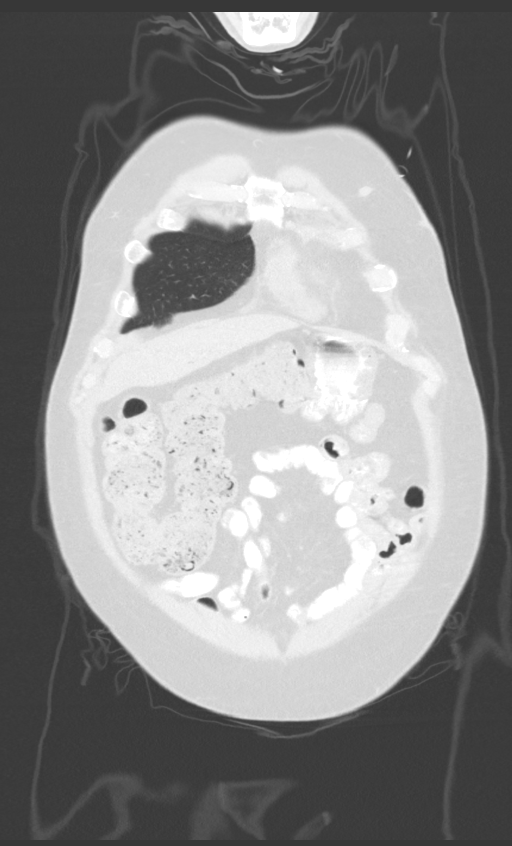
[im 58/144  lung]
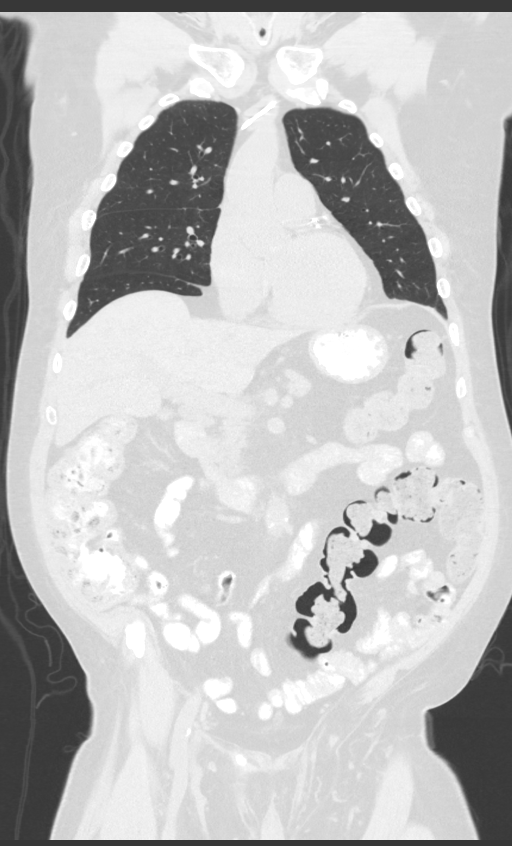
[im 86/144  lung]
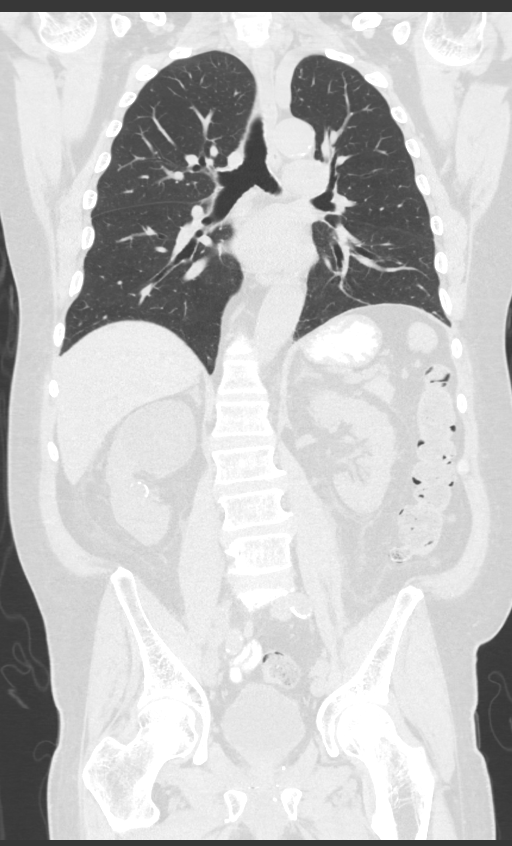

[13 of 36 positions shown; findings below may reference images not displayed]

FINDINGS: CT CHEST FINDINGS

Cardiovascular: Normal heart size. No pericardial effusion. Lad,
left circumflex and RCA coronary artery calcifications.

Mediastinum/Nodes: No enlarged mediastinal, hilar, or axillary lymph
nodes. Thyroid gland, trachea, and esophagus demonstrate no
significant findings.

Lungs/Pleura: No pleural effusion identified. Stable 3 mm right
upper lobe lung nodule, image 44/4.

Musculoskeletal: No chest wall mass or suspicious bone lesions
identified.

CT ABDOMEN PELVIS FINDINGS

Hepatobiliary: No focal liver abnormality is seen. No gallstones,
gallbladder wall thickening, or biliary dilatation.

Pancreas: Unremarkable. No pancreatic ductal dilatation or
surrounding inflammatory changes.

Spleen: Normal in size without focal abnormality.

Adrenals/Urinary Tract: The adrenal glands appear normal. No mass or
hydronephrosis identified bilaterally. Rim calcified cyst versus
renal artery aneurysm within the right kidney is unchanged measuring
1.4 cm. This is incompletely characterized without IV contrast.
Similarly, there is a 7 cm cyst arising from upper pole of right
kidney which is also incompletely characterized without IV contrast.
The urinary bladder is normal.

Stomach/Bowel: Status post distal gastrectomy with
gastrojejunostomy. No bowel wall thickening, inflammation or
distension.

Vascular/Lymphatic: Aortic atherosclerosis. No aneurysm identified.
No abdominopelvic adenopathy identified.

Reproductive: Fiducial markers noted within the prostate gland.

Other: Previously noted ovoid fluid collection along the
undersurface of the ventral abdominal wall has decreased in size
from previous exam. On today's exam this is predominantly soft
tissue in appearance measuring 6.7 x 1.9 by 4.3 cm. Previously this
was favored to represent sequelae of omental infarct. On [DATE]
this measured 8.6 x 3.9 by 6.4 cm. No ascites.

Musculoskeletal: L5-S1 degenerative disc disease. No suspicious bone
lesions identified.
IMPRESSION: 1. No acute findings and no specific findings identified to suggest
residual or recurrence of tumor or metastatic disease.
2. Aortic atherosclerosis. Three vessel coronary artery
calcifications noted.
3. Decrease in size of fluid collection along the undersurface of
the ventral abdominal wall compatible with sequelae of omental
infarct.

Aortic Atherosclerosis ([E4]-[E4]).

## 2018-12-14 IMAGING — CT CT CHEST W/O CM
2 of 4 series · 13 of 36 positions shown, 16 images · non-contrast
Comparison: The [DATE]

CLINICAL DATA: gastric adenocarcinoma restaging.

EXAM:
CT CHEST, ABDOMEN AND PELVIS WITHOUT CONTRAST
TECHNIQUE: Multidetector CT imaging of the chest, abdomen and pelvis was
performed following the standard protocol without IV contrast.

[Series 2: cap w/o · axial · non-contrast · 0.92mm/px · z∈[-669,-119]mm · 10 of 136 slices shown, 13 images]
[im 13/136  mediastinal]
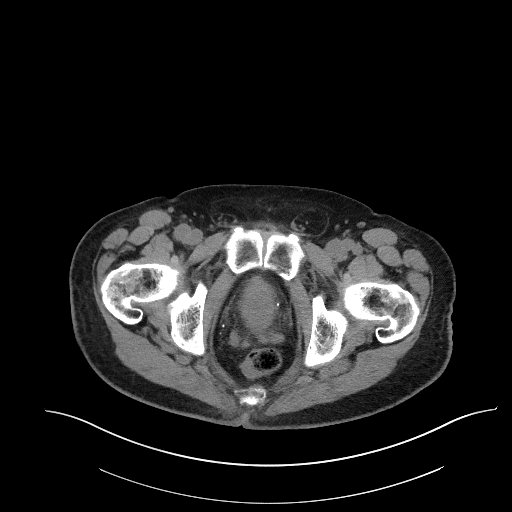
[im 13/136  lung]
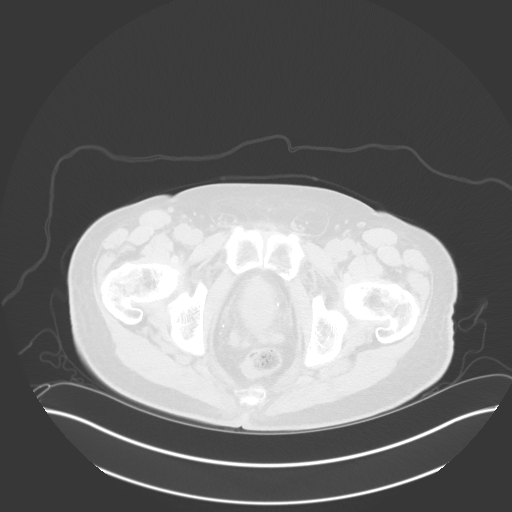
[im 25/136  lung]
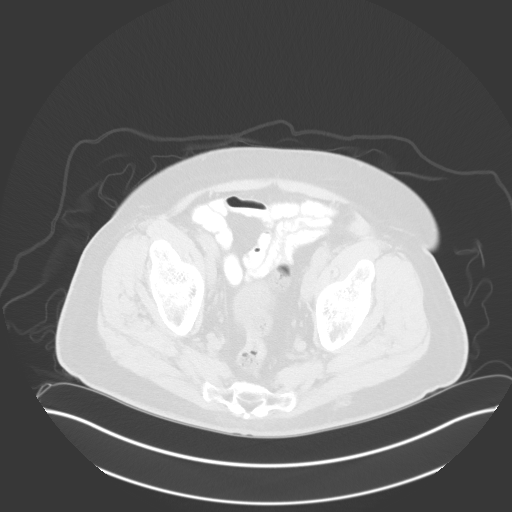
[im 37/136  lung]
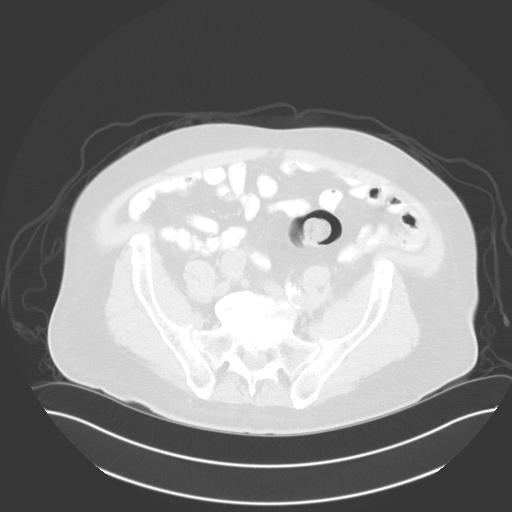
[im 50/136  lung]
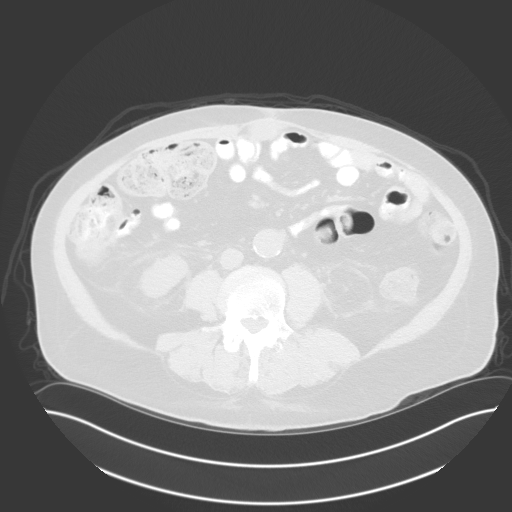
[im 62/136  mediastinal]
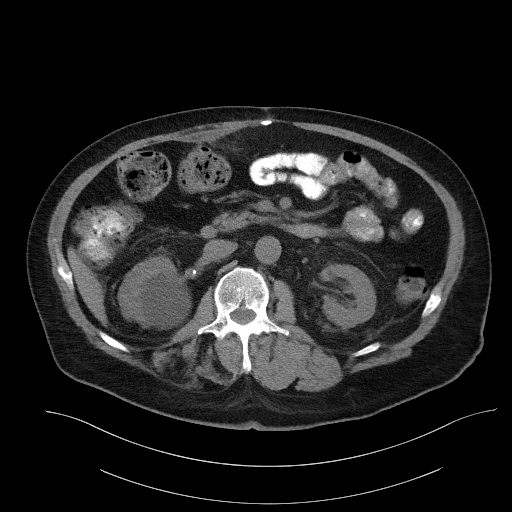
[im 62/136  lung]
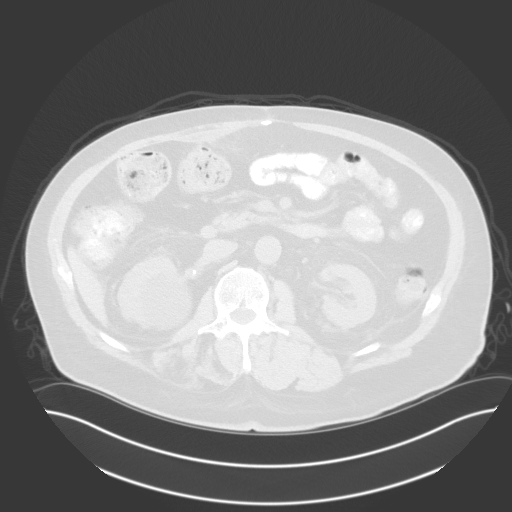
[im 74/136  lung]
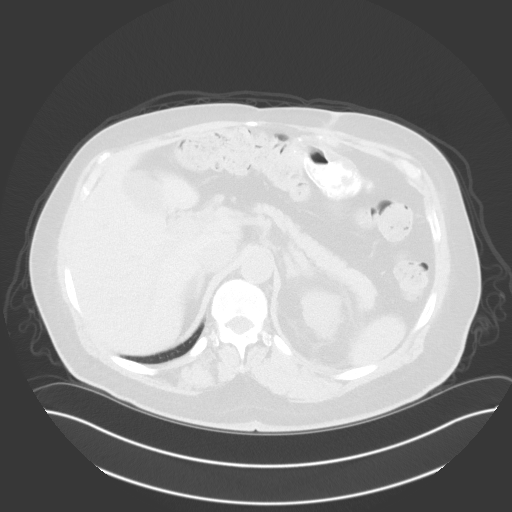
[im 86/136  lung]
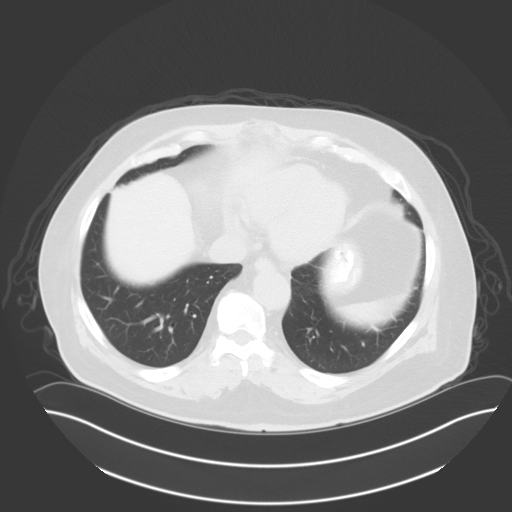
[im 99/136  lung]
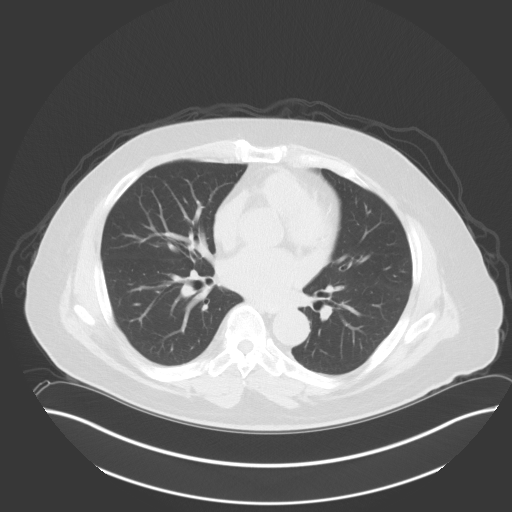
[im 111/136  mediastinal]
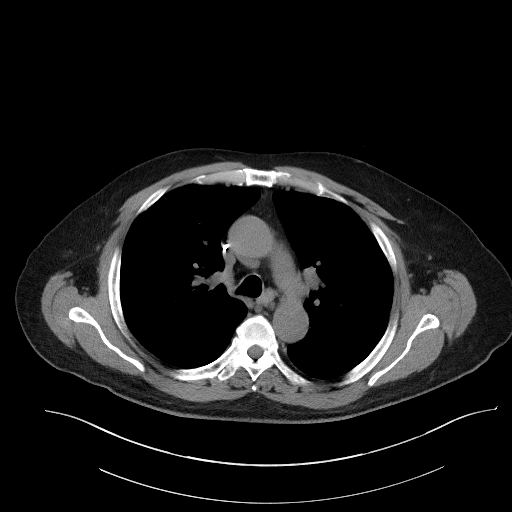
[im 111/136  lung]
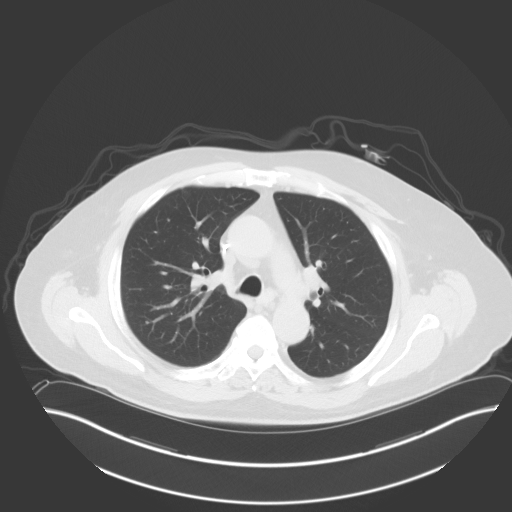
[im 123/136  lung]
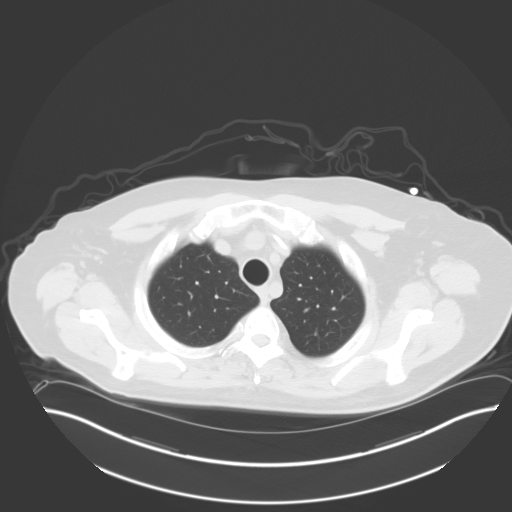

[Series 5: coronals · coronal · 0.82mm/px · 3 of 144 slices shown]
[im 29/144  lung]
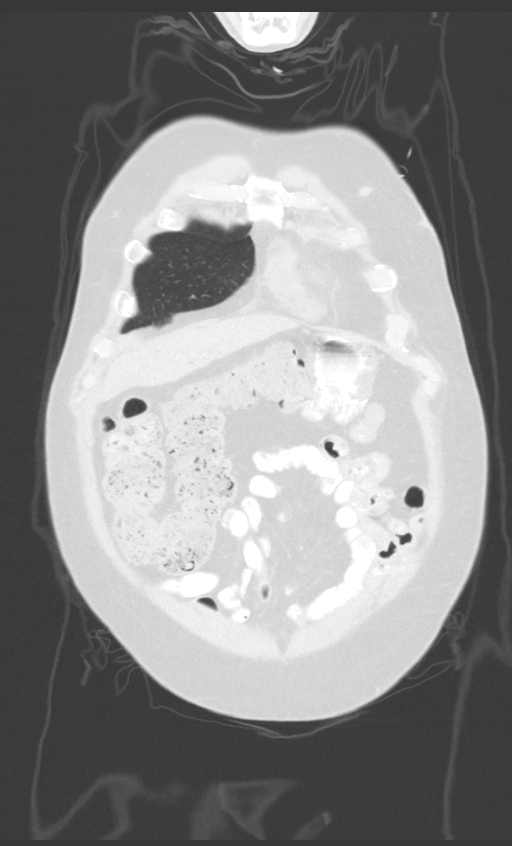
[im 58/144  lung]
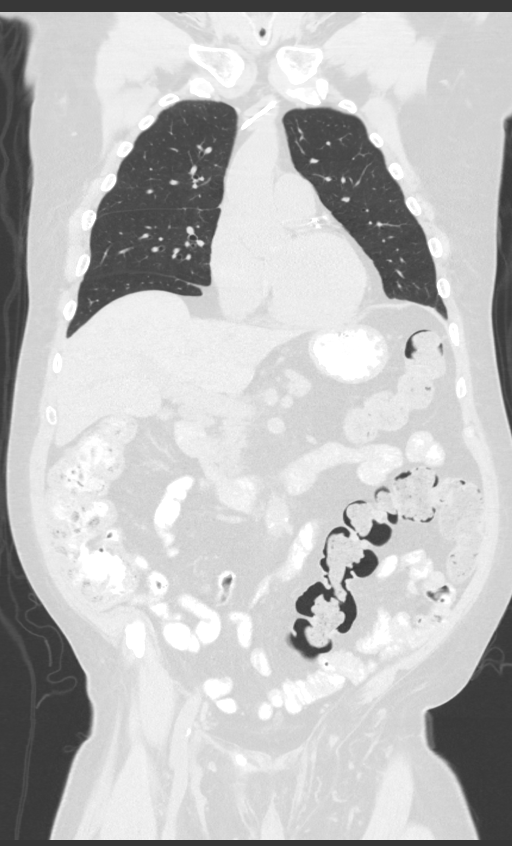
[im 86/144  lung]
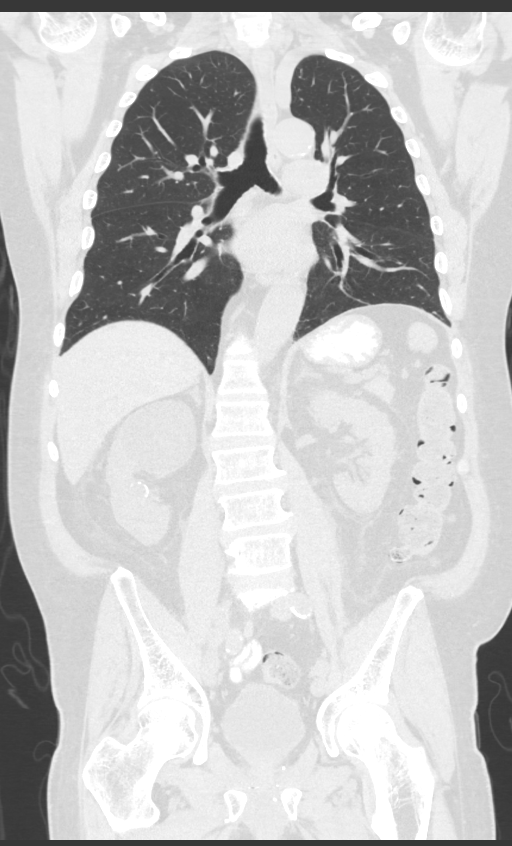

[13 of 36 positions shown; findings below may reference images not displayed]

FINDINGS: CT CHEST FINDINGS

Cardiovascular: Normal heart size. No pericardial effusion. Lad,
left circumflex and RCA coronary artery calcifications.

Mediastinum/Nodes: No enlarged mediastinal, hilar, or axillary lymph
nodes. Thyroid gland, trachea, and esophagus demonstrate no
significant findings.

Lungs/Pleura: No pleural effusion identified. Stable 3 mm right
upper lobe lung nodule, image 44/4.

Musculoskeletal: No chest wall mass or suspicious bone lesions
identified.

CT ABDOMEN PELVIS FINDINGS

Hepatobiliary: No focal liver abnormality is seen. No gallstones,
gallbladder wall thickening, or biliary dilatation.

Pancreas: Unremarkable. No pancreatic ductal dilatation or
surrounding inflammatory changes.

Spleen: Normal in size without focal abnormality.

Adrenals/Urinary Tract: The adrenal glands appear normal. No mass or
hydronephrosis identified bilaterally. Rim calcified cyst versus
renal artery aneurysm within the right kidney is unchanged measuring
1.4 cm. This is incompletely characterized without IV contrast.
Similarly, there is a 7 cm cyst arising from upper pole of right
kidney which is also incompletely characterized without IV contrast.
The urinary bladder is normal.

Stomach/Bowel: Status post distal gastrectomy with
gastrojejunostomy. No bowel wall thickening, inflammation or
distension.

Vascular/Lymphatic: Aortic atherosclerosis. No aneurysm identified.
No abdominopelvic adenopathy identified.

Reproductive: Fiducial markers noted within the prostate gland.

Other: Previously noted ovoid fluid collection along the
undersurface of the ventral abdominal wall has decreased in size
from previous exam. On today's exam this is predominantly soft
tissue in appearance measuring 6.7 x 1.9 by 4.3 cm. Previously this
was favored to represent sequelae of omental infarct. On [DATE]
this measured 8.6 x 3.9 by 6.4 cm. No ascites.

Musculoskeletal: L5-S1 degenerative disc disease. No suspicious bone
lesions identified.
IMPRESSION: 1. No acute findings and no specific findings identified to suggest
residual or recurrence of tumor or metastatic disease.
2. Aortic atherosclerosis. Three vessel coronary artery
calcifications noted.
3. Decrease in size of fluid collection along the undersurface of
the ventral abdominal wall compatible with sequelae of omental
infarct.

Aortic Atherosclerosis ([E4]-[E4]).

## 2018-12-14 IMAGING — CT CT CHEST W/O CM
2 of 4 series · 13 of 36 positions shown, 16 images · non-contrast
Comparison: The [DATE]

CLINICAL DATA: gastric adenocarcinoma restaging.

EXAM:
CT CHEST, ABDOMEN AND PELVIS WITHOUT CONTRAST
TECHNIQUE: Multidetector CT imaging of the chest, abdomen and pelvis was
performed following the standard protocol without IV contrast.

[Series 2: cap w/o · axial · non-contrast · 0.92mm/px · z∈[-669,-119]mm · 10 of 136 slices shown, 13 images]
[im 13/136  mediastinal]
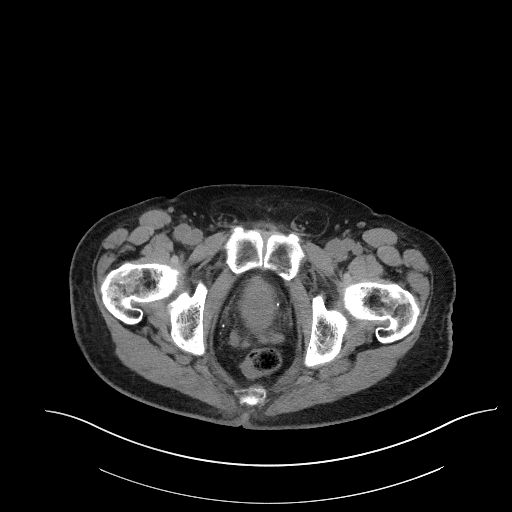
[im 13/136  lung]
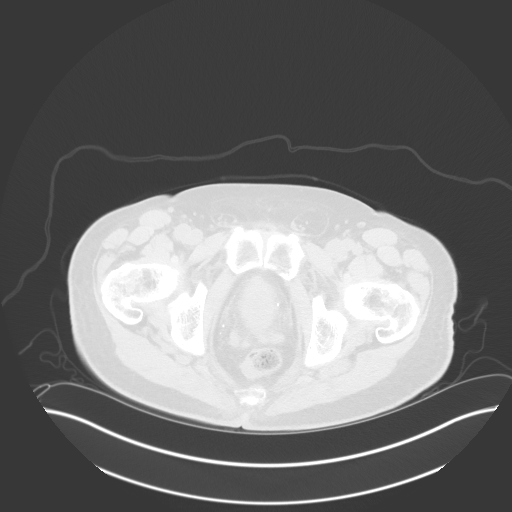
[im 25/136  lung]
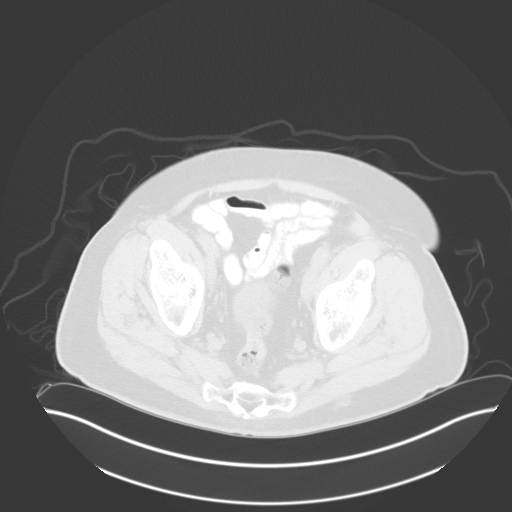
[im 37/136  lung]
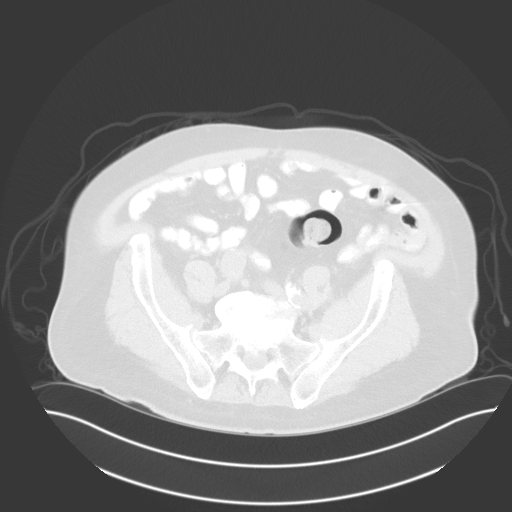
[im 50/136  lung]
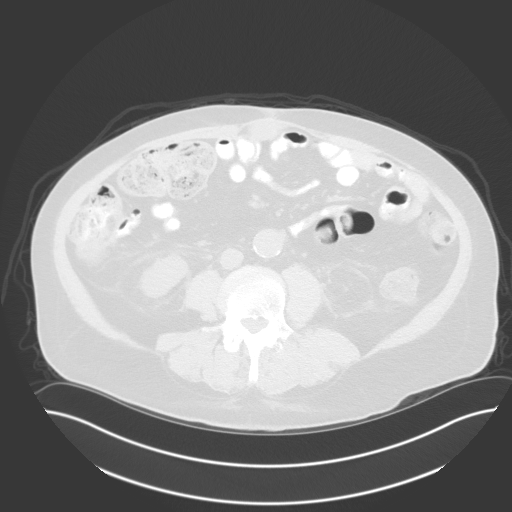
[im 62/136  mediastinal]
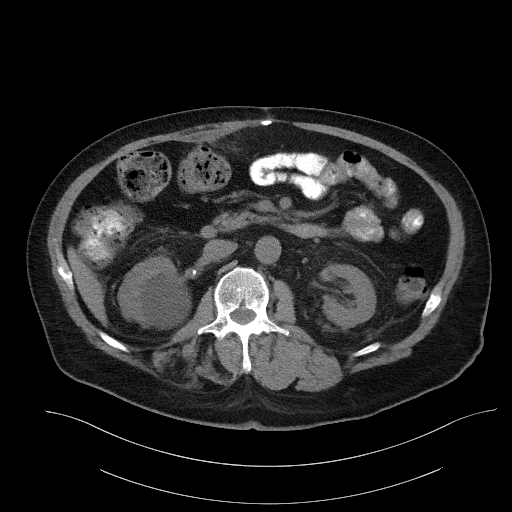
[im 62/136  lung]
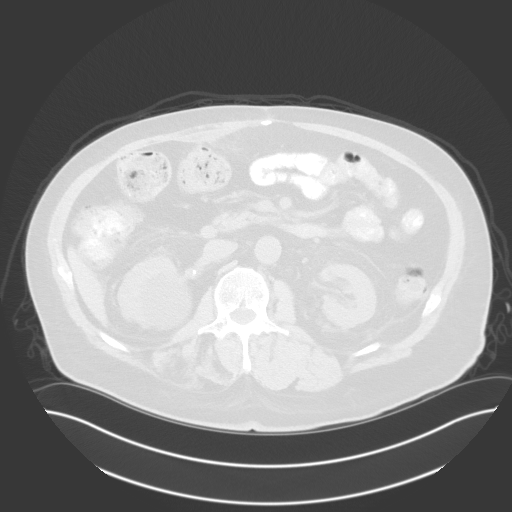
[im 74/136  lung]
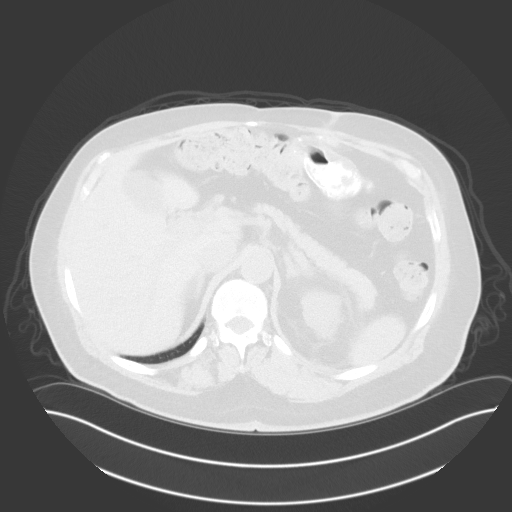
[im 86/136  lung]
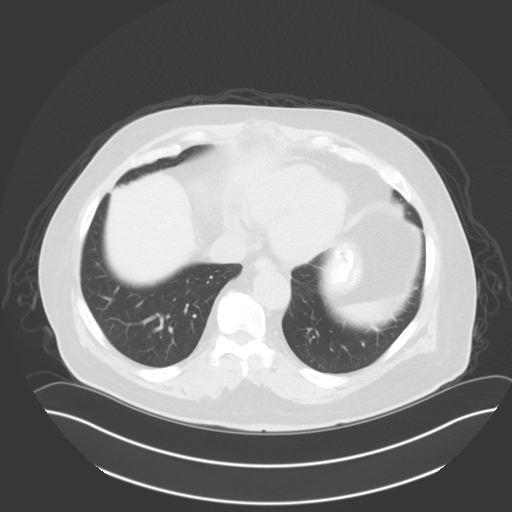
[im 99/136  lung]
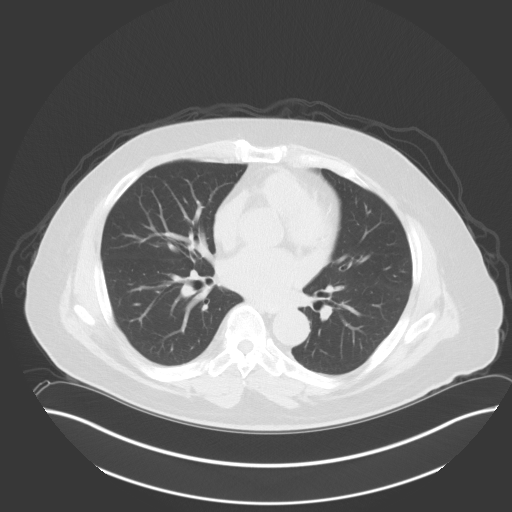
[im 111/136  mediastinal]
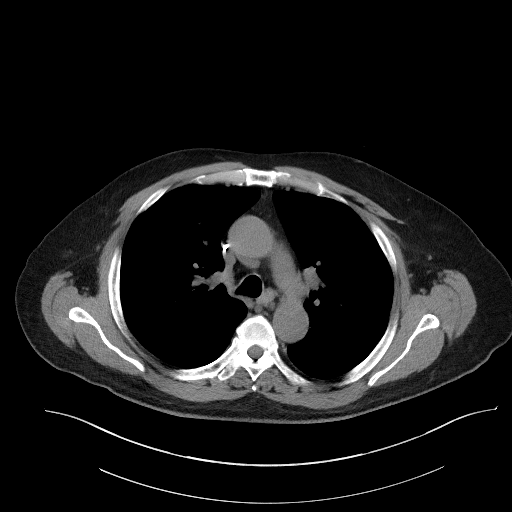
[im 111/136  lung]
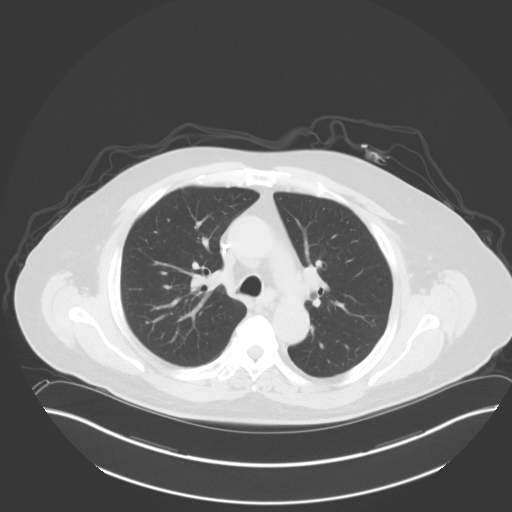
[im 123/136  lung]
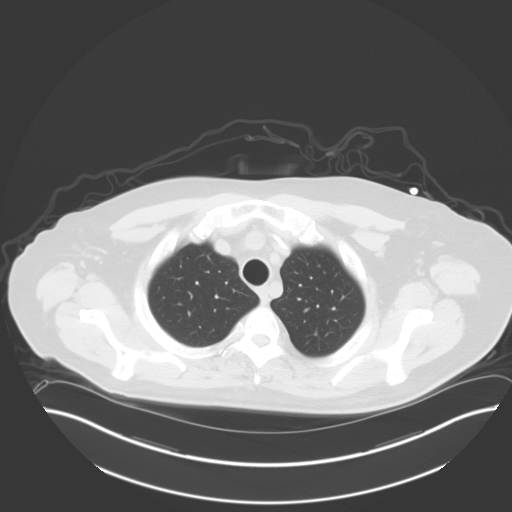

[Series 5: coronals · coronal · 0.82mm/px · 3 of 144 slices shown]
[im 29/144  lung]
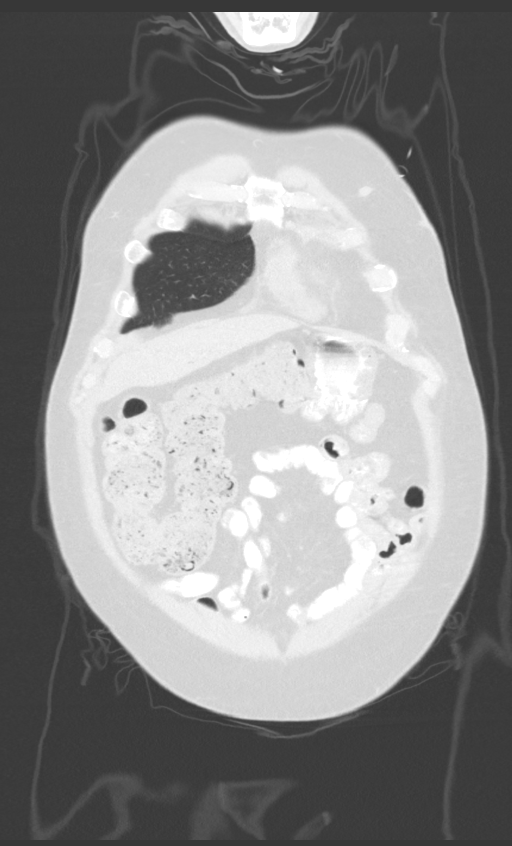
[im 58/144  lung]
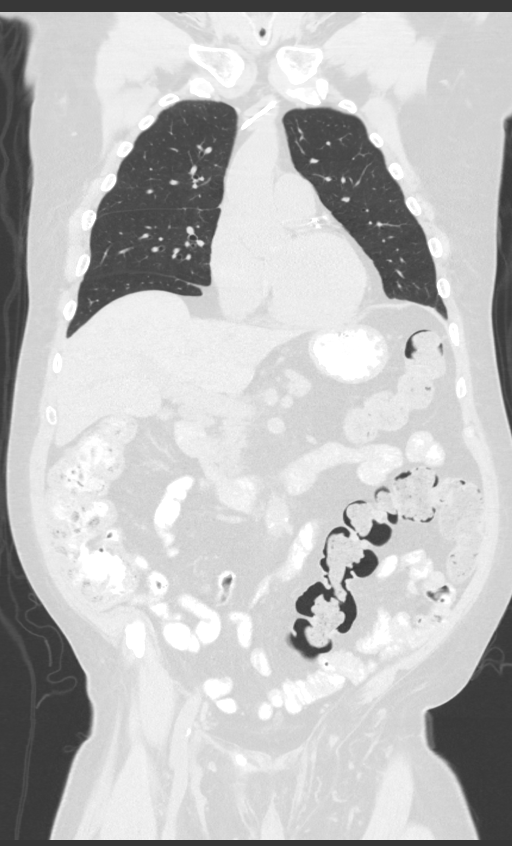
[im 86/144  lung]
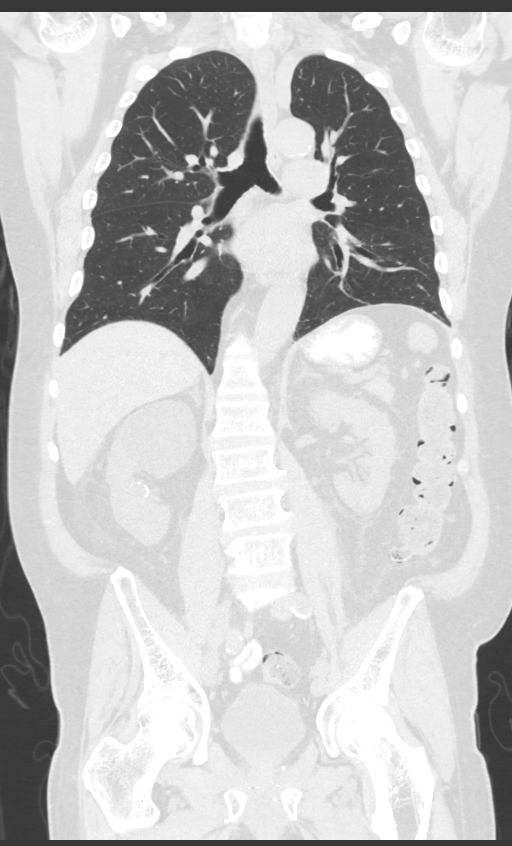

[13 of 36 positions shown; findings below may reference images not displayed]

FINDINGS: CT CHEST FINDINGS

Cardiovascular: Normal heart size. No pericardial effusion. Lad,
left circumflex and RCA coronary artery calcifications.

Mediastinum/Nodes: No enlarged mediastinal, hilar, or axillary lymph
nodes. Thyroid gland, trachea, and esophagus demonstrate no
significant findings.

Lungs/Pleura: No pleural effusion identified. Stable 3 mm right
upper lobe lung nodule, image 44/4.

Musculoskeletal: No chest wall mass or suspicious bone lesions
identified.

CT ABDOMEN PELVIS FINDINGS

Hepatobiliary: No focal liver abnormality is seen. No gallstones,
gallbladder wall thickening, or biliary dilatation.

Pancreas: Unremarkable. No pancreatic ductal dilatation or
surrounding inflammatory changes.

Spleen: Normal in size without focal abnormality.

Adrenals/Urinary Tract: The adrenal glands appear normal. No mass or
hydronephrosis identified bilaterally. Rim calcified cyst versus
renal artery aneurysm within the right kidney is unchanged measuring
1.4 cm. This is incompletely characterized without IV contrast.
Similarly, there is a 7 cm cyst arising from upper pole of right
kidney which is also incompletely characterized without IV contrast.
The urinary bladder is normal.

Stomach/Bowel: Status post distal gastrectomy with
gastrojejunostomy. No bowel wall thickening, inflammation or
distension.

Vascular/Lymphatic: Aortic atherosclerosis. No aneurysm identified.
No abdominopelvic adenopathy identified.

Reproductive: Fiducial markers noted within the prostate gland.

Other: Previously noted ovoid fluid collection along the
undersurface of the ventral abdominal wall has decreased in size
from previous exam. On today's exam this is predominantly soft
tissue in appearance measuring 6.7 x 1.9 by 4.3 cm. Previously this
was favored to represent sequelae of omental infarct. On [DATE]
this measured 8.6 x 3.9 by 6.4 cm. No ascites.

Musculoskeletal: L5-S1 degenerative disc disease. No suspicious bone
lesions identified.
IMPRESSION: 1. No acute findings and no specific findings identified to suggest
residual or recurrence of tumor or metastatic disease.
2. Aortic atherosclerosis. Three vessel coronary artery
calcifications noted.
3. Decrease in size of fluid collection along the undersurface of
the ventral abdominal wall compatible with sequelae of omental
infarct.

Aortic Atherosclerosis ([E4]-[E4]).

## 2018-12-14 MED ORDER — HEPARIN SOD (PORK) LOCK FLUSH 100 UNIT/ML IV SOLN
INTRAVENOUS | Status: AC
  Filled 2018-12-14: qty 5

## 2018-12-14 MED ORDER — SODIUM CHLORIDE 0.9% FLUSH
10.0000 mL | Freq: Once | INTRAVENOUS | Status: AC
  Administered 2018-12-14: 10 mL
  Filled 2018-12-14: qty 10

## 2018-12-15 LAB — MULTIPLE MYELOMA PANEL, SERUM
Albumin SerPl Elph-Mcnc: 3.1 g/dL (ref 2.9–4.4)
Albumin/Glob SerPl: 1 (ref 0.7–1.7)
Alpha 1: 0.3 g/dL (ref 0.0–0.4)
Alpha2 Glob SerPl Elph-Mcnc: 0.8 g/dL (ref 0.4–1.0)
B-Globulin SerPl Elph-Mcnc: 1 g/dL (ref 0.7–1.3)
Gamma Glob SerPl Elph-Mcnc: 1.1 g/dL (ref 0.4–1.8)
Globulin, Total: 3.3 g/dL (ref 2.2–3.9)
IgA: 272 mg/dL (ref 61–437)
IgG (Immunoglobin G), Serum: 1065 mg/dL (ref 603–1613)
IgM (Immunoglobulin M), Srm: 32 mg/dL (ref 15–143)
Total Protein ELP: 6.4 g/dL (ref 6.0–8.5)

## 2018-12-15 LAB — KAPPA/LAMBDA LIGHT CHAINS
Kappa free light chain: 148.1 mg/L — ABNORMAL HIGH (ref 3.3–19.4)
Kappa, lambda light chain ratio: 3.67 — ABNORMAL HIGH (ref 0.26–1.65)
Lambda free light chains: 40.3 mg/L — ABNORMAL HIGH (ref 5.7–26.3)

## 2018-12-21 ENCOUNTER — Telehealth: Payer: Self-pay | Admitting: Oncology

## 2018-12-21 ENCOUNTER — Other Ambulatory Visit: Payer: Self-pay

## 2018-12-21 ENCOUNTER — Inpatient Hospital Stay: Payer: Medicare Other | Admitting: Oncology

## 2018-12-21 VITALS — BP 145/87 | HR 79 | Temp 97.9°F | Resp 16 | Ht 74.0 in | Wt 231.3 lb

## 2018-12-21 DIAGNOSIS — D472 Monoclonal gammopathy: Secondary | ICD-10-CM | POA: Diagnosis not present

## 2018-12-21 DIAGNOSIS — D631 Anemia in chronic kidney disease: Secondary | ICD-10-CM | POA: Diagnosis not present

## 2018-12-21 DIAGNOSIS — N189 Chronic kidney disease, unspecified: Secondary | ICD-10-CM | POA: Diagnosis not present

## 2018-12-21 DIAGNOSIS — C169 Malignant neoplasm of stomach, unspecified: Secondary | ICD-10-CM

## 2018-12-21 NOTE — Progress Notes (Signed)
Hematology and Oncology Follow Up Visit  Jacob [name] [id number] 1945-01-26 73 y.o. 12/21/2018 1:02 PM [name], [name], MDAvva, Ravisankar, MD   Principle Diagnosis:  [age]-year-old with:     1.  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  He was found to have T3 N0 disease on final pathological staging.  2.  Prostate cancer diagnosed in 2013.  He was found to have T1c, Gleason score 3+4 = 7 and a PSA 5.66 at the time of diagnosis.  3.  IgG lambda MGUS diagnosed in 2017.  He remains on active surveillance at this time.  No evidence of multiple myeloma.   Prior Therapy:  He is status post radiation therapy for definitive treatment for his prostate cancer. Therapy concluded in December 2013. He is status post packed red cell transfusions in April 2017. He is status post bone marrow biopsy in April 2017. Results did not show any plasma cell disorder or myelodysplasia. He is status post IV iron infusion completed in April 2017. This was repeated in September 2017. FOLFOX chemotherapy with cycle 1 to be given on 12/12/2015.  He is S/P cycle 5 of therapy given on 02/20/2016. He is status post distal gastrectomy and Billroth II reconstruction completed on 03/25/2016. The final pathology revealed a T3 N0 residual tumor without any lymphadenopathy involved.  Current therapy: Active surveillance.   Interim History:  Jacob [name] returns today for a repeat evaluation.  Since the last visit, he was hospitalized in September 2020 for a small bowel obstruction which has resolved spontaneously without any surgical intervention.  Since that time, he denies any abdominal pain, distention or early satiety.  His appetite is improving at this time although lost weight during that hospitalization.  He denies any nausea or vomiting or changes in his bowels.  Patient denied any alteration mental status, neuropathy, confusion or dizziness.  Denies any headaches or lethargy.  Denies any night sweats.    Denied orthopnea, dyspnea on exertion or chest discomfort.  Denies shortness of breath, difficulty breathing hemoptysis or cough.  Denies any abdominal distention, nausea, early satiety or dyspepsia.  Denies any hematuria, frequency, dysuria or nocturia.  Denies any skin irritation, dryness or rash.  Denies any ecchymosis or petechiae.  Denies any lymphadenopathy or clotting.  Denies any heat or cold intolerance.  Denies any anxiety or depression.  Remaining review of system is negative.      Medications: Without any changes on review. Current Outpatient Medications  Medication Sig Dispense Refill  . acetaminophen (TYLENOL) 500 MG tablet Take 1,000 mg by mouth daily as needed for moderate pain or headache.    Marland Kitchen buPROPion (WELLBUTRIN XL) 300 MG 24 hr tablet Take 300 mg by mouth daily.     . calcium carbonate (TUMS - DOSED IN MG ELEMENTAL CALCIUM) 500 MG chewable tablet Chew 3 tablets by mouth 2 (two) times daily as needed for indigestion or heartburn.    . furosemide (LASIX) 40 MG tablet Take 80 mg by mouth every other day.     . gabapentin (NEURONTIN) 300 MG capsule Take 1 capsule (300 mg total) by mouth at bedtime. 10 capsule 0  . hydrALAZINE (APRESOLINE) 25 MG tablet Take 25 mg by mouth 2 (two) times daily.    . Melatonin 10 MG TABS Take 1 tablet by mouth at bedtime as needed (sleep).     . Nebivolol HCl (BYSTOLIC) 20 MG TABS Take 20 mg by mouth daily.     . pantoprazole (PROTONIX) 40 MG tablet Take 1 tablet (  40 mg total) by mouth daily before supper. 30 tablet 0  . polycarbophil (FIBERCON) 625 MG tablet Take 1 tablet (625 mg total) by mouth daily. 30 tablet 3  . polyethylene glycol (MIRALAX / GLYCOLAX) 17 g packet Take 17 g by mouth 2 (two) times daily. 14 each 0  . prochlorperazine (COMPAZINE) 10 MG tablet Take 1 tablet (10 mg total) by mouth every 6 (six) hours as needed for nausea or vomiting. 30 tablet 1  . tamsulosin (FLOMAX) 0.4 MG CAPS capsule TAKE 1 CAPSULE BY MOUTH DAILY (Patient  taking differently: Take 0.4 mg by mouth daily after supper. ) 30 capsule 2  . Verapamil HCl CR 300 MG CP24 Take 300 mg by mouth at bedtime.      No current facility-administered medications for this visit.      Allergies:  No Known Allergies  Past Medical History, Surgical history, Social history, and Family History without any changes on update.   Physical Exam:  Blood pressure (!) 145/87, pulse 79, temperature 97.9 F (36.6 C), temperature source Temporal, resp. rate 16, height 6' 2"  (1.88 m), weight 231 lb 4.8 oz (104.9 kg), SpO2 100 %.    ECOG: 1   General appearance: Alert, awake without any distress. Head: Atraumatic without abnormalities Oropharynx: Without any thrush or ulcers. Eyes: No scleral icterus. Lymph nodes: No lymphadenopathy noted in the cervical, supraclavicular, or axillary nodes Heart:regular rate and rhythm, without any murmurs or gallops.   Lung: Clear to auscultation without any rhonchi, wheezes or dullness to percussion. Abdomin: Soft, nontender without any shifting dullness or ascites. Musculoskeletal: No clubbing or cyanosis. Neurological: No motor or sensory deficits. Skin: No rashes or lesions.      Lab Results: Lab Results  Component Value Date   WBC 7.5 12/14/2018   HGB 9.6 (L) 12/14/2018   HCT 30.7 (L) 12/14/2018   MCV 99.0 12/14/2018   PLT 192 12/14/2018     Chemistry      Component Value Date/Time   NA 140 12/14/2018 1102   NA 141 01/16/2017 0924   K 4.5 12/14/2018 1102   K 4.8 01/16/2017 0924   CL 106 12/14/2018 1102   CO2 24 12/14/2018 1102   CO2 25 01/16/2017 0924   BUN 20 12/14/2018 1102   BUN 24.0 01/16/2017 0924   CREATININE 2.07 (H) 12/14/2018 1102   CREATININE 1.9 (H) 01/16/2017 0924      Component Value Date/Time   CALCIUM 9.9 12/14/2018 1102   CALCIUM 9.8 01/16/2017 0924   ALKPHOS 48 12/14/2018 1102   ALKPHOS 54 01/16/2017 0924   AST 15 12/14/2018 1102   AST 16 01/16/2017 0924   ALT 10 12/14/2018 1102    ALT 10 01/16/2017 0924   BILITOT 0.5 12/14/2018 1102   BILITOT 0.54 01/16/2017 0924     IMPRESSION: 1. No acute findings and no specific findings identified to suggest residual or recurrence of tumor or metastatic disease. 2. Aortic atherosclerosis. Three vessel coronary artery calcifications noted. 3. Decrease in size of fluid collection along the undersurface of the ventral abdominal wall compatible with sequelae of omental Infarct.  Results for SAUL, FABIANO (MRN [id number]) as of 12/21/2018 13:03  Ref. Range 09/15/2017 10:51 12/14/2018 11:02  M Protein SerPl Elph-Mcnc Latest Ref Range: Not Observed g/dL 0.3 (H) Not Observed  IFE 1 Unknown Comment Comment (A)  Globulin, Total Latest Ref Range: 2.2 - 3.9 g/dL 3.3 3.3  B-Globulin SerPl Elph-Mcnc Latest Ref Range: 0.7 - 1.3 g/dL 1.1 1.0  IgG (Immunoglobin  G), Serum Latest Ref Range: 603 - 1,613 mg/dL 1,181 1,065  IgM (Immunoglobulin M), Srm Latest Ref Range: 15 - 143 mg/dL 43 32  IgA Latest Ref Range: 61 - 437 mg/dL 414 272      Impression and Plan:  73 year old man with:    1.  Gastric cancer diagnosed in 2017.  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  He remains disease-free after treatment outlined above.   Imaging studies obtained on December 1 of 2020 were personally reviewed and continues to show no evidence of active disease.  The natural course of this disease as well as risk of relapse was assessed.  Management options at this time were reviewed.  I have recommended continued active surveillance with repeat imaging studies annually.  He will finish a surveillance imaging studies in 2022.  2.  Anemia: Related to renal insufficiency and chronic disease.  Hemoglobin is adequate and does not require any additional intervention.   3.  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  Protein studies obtained on  December 1 of 2020 showed no changes at this time to suggest evolving plasma cell disorder.  His M spike is no longer detectable although immunofixation still continues to show a small monoclonal protein.   4. Renal insufficiency: Kidney function remains stable at this time without any further deterioration.  5. IV access: Risks and benefits of removing his Port-A-Cath was discussed today.  He prefers to keep it in place for the time being and will continue to flush every 2 months.  6. Follow-up: He will return every 2 months for a Port-A-Cath flush and has an MD follow-up in 6 months.  25 minutes was spent on today's visit.  More than 50% spent face-to-face of the visit dedicated to reviewing imaging studies, laboratory data, management options for the future and answering questions regarding complications related to prior therapies.  Zola Button, MD 12/8/20201:02 PM

## 2018-12-21 NOTE — Telephone Encounter (Signed)
Scheduled appt per 12/8 los - gave patient AVS and calender per los.   

## 2019-02-07 ENCOUNTER — Encounter: Payer: Self-pay | Admitting: Podiatry

## 2019-02-07 ENCOUNTER — Other Ambulatory Visit: Payer: Self-pay

## 2019-02-07 ENCOUNTER — Ambulatory Visit: Payer: Medicare Other | Admitting: Podiatry

## 2019-02-07 DIAGNOSIS — B351 Tinea unguium: Secondary | ICD-10-CM | POA: Diagnosis not present

## 2019-02-07 DIAGNOSIS — M79675 Pain in left toe(s): Secondary | ICD-10-CM

## 2019-02-07 DIAGNOSIS — L84 Corns and callosities: Secondary | ICD-10-CM | POA: Diagnosis not present

## 2019-02-07 DIAGNOSIS — N183 Chronic kidney disease, stage 3 unspecified: Secondary | ICD-10-CM

## 2019-02-07 DIAGNOSIS — G609 Hereditary and idiopathic neuropathy, unspecified: Secondary | ICD-10-CM

## 2019-02-07 DIAGNOSIS — D631 Anemia in chronic kidney disease: Secondary | ICD-10-CM | POA: Diagnosis not present

## 2019-02-07 DIAGNOSIS — M79674 Pain in right toe(s): Secondary | ICD-10-CM

## 2019-02-07 NOTE — Patient Instructions (Signed)

## 2019-02-09 ENCOUNTER — Ambulatory Visit: Payer: Medicare Other

## 2019-02-12 NOTE — Progress Notes (Signed)
Subjective: Jacob [name] presents today for follow up of callus(es) b/l feet and painful mycotic toenails b/l that are difficult to trim. Pain interferes with ambulation. Aggravating factors include wearing enclosed shoe gear. Pain is relieved with periodic professional debridement..   No Known Allergies   Objective: There were no vitals filed for this visit.  Vascular Examination:  Capillary refill time to digits immediate b/l, palpable DP pulses b/l, palpable PT pulses b/l, pedal hair absent b/l and skin temperature gradient within normal limits b/l  Dermatological Examination: Pedal skin with normal turgor, texture and tone bilaterally, no open wounds bilaterally, no interdigital macerations bilaterally, toenails 1-5 b/l elongated, dystrophic, thickened, crumbly with subungual debris and hyperkeratotic lesion(s) submet head 4 b/l.  No erythema, no edema, no drainage, no flocculence  Musculoskeletal: Normal muscle strength 5/5 to all lower extremity muscle groups bilaterally, no pain crepitus or joint limitation noted with ROM b/l, bunion deformity noted b/l and pes planus deformity noted  Neurological: Protective sensation decreased with 10 gram monofilament b/l  Assessment: 1. Pain due to onychomycosis of toenails of both feet   2. Callus   3. Idiopathic peripheral neuropathy   4. Anemia in stage 3 chronic kidney disease, unspecified whether stage 3a or 3b CKD     Plan: -Toenails 1-5 b/l were debrided in length and girth without iatrogenic bleeding. -corns and calluses were debrided without complication or incident. Total number debrided =2 submet head 4 b/l -Patient to continue soft, supportive shoe gear daily. -Patient to report any pedal injuries to medical professional immediately. -Patient/POA to call should there be question/concern in the interim.  Return in about 3 months (around 05/08/2019) for nail and callus trim.

## 2019-02-17 ENCOUNTER — Ambulatory Visit: Payer: Medicare Other | Attending: Internal Medicine

## 2019-02-17 DIAGNOSIS — Z23 Encounter for immunization: Secondary | ICD-10-CM | POA: Insufficient documentation

## 2019-02-17 NOTE — Progress Notes (Signed)
   Covid-19 Vaccination Clinic  Name:  Rendell Thivierge    MRN: [id number] DOB: [birth year]  02/17/2019  Mr. Holleran was observed post Covid-19 immunization for 15 minutes without incidence. He was provided with Vaccine Information Sheet and instruction to access the V-Safe system.   Mr. Santillo was instructed to call 911 with any severe reactions post vaccine: Marland Kitchen Difficulty breathing  . Swelling of your face and throat  . A fast heartbeat  . A bad rash all over your body  . Dizziness and weakness    Immunizations Administered    Name Date Dose VIS Date Route   Pfizer COVID-19 Vaccine 02/17/2019  6:17 PM 0.3 mL 12/24/2018 Intramuscular   Manufacturer: [place]   Lot: [id number]   [place]: [id number]

## 2019-02-20 ENCOUNTER — Ambulatory Visit: Payer: Medicare Other

## 2019-02-21 ENCOUNTER — Inpatient Hospital Stay: Payer: Medicare Other | Attending: Oncology

## 2019-02-21 ENCOUNTER — Other Ambulatory Visit: Payer: Self-pay

## 2019-02-21 DIAGNOSIS — D649 Anemia, unspecified: Secondary | ICD-10-CM | POA: Diagnosis not present

## 2019-02-21 DIAGNOSIS — Z8546 Personal history of malignant neoplasm of prostate: Secondary | ICD-10-CM | POA: Insufficient documentation

## 2019-02-21 DIAGNOSIS — I700 Atherosclerosis of aorta: Secondary | ICD-10-CM | POA: Diagnosis not present

## 2019-02-21 DIAGNOSIS — N289 Disorder of kidney and ureter, unspecified: Secondary | ICD-10-CM | POA: Diagnosis not present

## 2019-02-21 DIAGNOSIS — N189 Chronic kidney disease, unspecified: Secondary | ICD-10-CM

## 2019-02-21 DIAGNOSIS — Z95828 Presence of other vascular implants and grafts: Secondary | ICD-10-CM

## 2019-02-21 DIAGNOSIS — D472 Monoclonal gammopathy: Secondary | ICD-10-CM | POA: Insufficient documentation

## 2019-02-21 DIAGNOSIS — D631 Anemia in chronic kidney disease: Secondary | ICD-10-CM

## 2019-02-21 DIAGNOSIS — Z923 Personal history of irradiation: Secondary | ICD-10-CM | POA: Insufficient documentation

## 2019-02-21 DIAGNOSIS — C169 Malignant neoplasm of stomach, unspecified: Secondary | ICD-10-CM | POA: Insufficient documentation

## 2019-02-21 DIAGNOSIS — Z79899 Other long term (current) drug therapy: Secondary | ICD-10-CM | POA: Diagnosis not present

## 2019-02-21 MED ORDER — SODIUM CHLORIDE 0.9% FLUSH
10.0000 mL | Freq: Once | INTRAVENOUS | Status: AC
  Administered 2019-02-21: 10 mL
  Filled 2019-02-21: qty 10

## 2019-02-21 MED ORDER — HEPARIN SOD (PORK) LOCK FLUSH 100 UNIT/ML IV SOLN
500.0000 [IU] | Freq: Once | INTRAVENOUS | Status: AC
  Administered 2019-02-21: 15:00:00 500 [IU]
  Filled 2019-02-21: qty 5

## 2019-02-23 ENCOUNTER — Other Ambulatory Visit: Payer: Self-pay | Admitting: Urology

## 2019-03-15 ENCOUNTER — Ambulatory Visit: Payer: Medicare Other | Attending: Internal Medicine

## 2019-03-15 DIAGNOSIS — Z23 Encounter for immunization: Secondary | ICD-10-CM | POA: Insufficient documentation

## 2019-03-15 NOTE — Progress Notes (Signed)
   Covid-19 Vaccination Clinic  Name:  Alick Lecomte    MRN: [id number] DOB: 07-Sep-1945  03/15/2019  Mr. Dais was observed post Covid-19 immunization for 15 minutes without incident. He was provided with Vaccine Information Sheet and instruction to access the V-Safe system.   Mr. Eleazer was instructed to call 911 with any severe reactions post vaccine: Marland Kitchen Difficulty breathing  . Swelling of face and throat  . A fast heartbeat  . A bad rash all over body  . Dizziness and weakness   Immunizations Administered    Name Date Dose VIS Date Route   Pfizer COVID-19 Vaccine 03/15/2019 10:44 AM 0.3 mL 12/24/2018 Intramuscular   Manufacturer: [place]   Lot: [id number]   [place]: [id number]

## 2019-04-21 ENCOUNTER — Inpatient Hospital Stay: Payer: Medicare Other | Attending: Oncology

## 2019-04-21 ENCOUNTER — Other Ambulatory Visit: Payer: Self-pay

## 2019-04-21 DIAGNOSIS — Z79899 Other long term (current) drug therapy: Secondary | ICD-10-CM | POA: Insufficient documentation

## 2019-04-21 DIAGNOSIS — Z452 Encounter for adjustment and management of vascular access device: Secondary | ICD-10-CM | POA: Insufficient documentation

## 2019-04-21 DIAGNOSIS — C169 Malignant neoplasm of stomach, unspecified: Secondary | ICD-10-CM | POA: Diagnosis present

## 2019-04-21 DIAGNOSIS — Z95828 Presence of other vascular implants and grafts: Secondary | ICD-10-CM

## 2019-04-21 DIAGNOSIS — D472 Monoclonal gammopathy: Secondary | ICD-10-CM | POA: Insufficient documentation

## 2019-04-21 DIAGNOSIS — C61 Malignant neoplasm of prostate: Secondary | ICD-10-CM | POA: Diagnosis not present

## 2019-04-21 DIAGNOSIS — D631 Anemia in chronic kidney disease: Secondary | ICD-10-CM

## 2019-04-21 DIAGNOSIS — N289 Disorder of kidney and ureter, unspecified: Secondary | ICD-10-CM | POA: Insufficient documentation

## 2019-04-21 MED ORDER — SODIUM CHLORIDE 0.9% FLUSH
10.0000 mL | Freq: Once | INTRAVENOUS | Status: AC
  Administered 2019-04-21: 10 mL
  Filled 2019-04-21: qty 10

## 2019-04-21 MED ORDER — HEPARIN SOD (PORK) LOCK FLUSH 100 UNIT/ML IV SOLN
500.0000 [IU] | Freq: Once | INTRAVENOUS | Status: AC
  Administered 2019-04-21: 500 [IU]
  Filled 2019-04-21: qty 5

## 2019-04-21 NOTE — Patient Instructions (Signed)

## 2019-05-09 ENCOUNTER — Other Ambulatory Visit: Payer: Self-pay

## 2019-05-09 ENCOUNTER — Ambulatory Visit: Payer: Medicare Other | Admitting: Podiatry

## 2019-05-09 DIAGNOSIS — M79674 Pain in right toe(s): Secondary | ICD-10-CM

## 2019-05-09 DIAGNOSIS — D631 Anemia in chronic kidney disease: Secondary | ICD-10-CM | POA: Diagnosis not present

## 2019-05-09 DIAGNOSIS — N183 Chronic kidney disease, stage 3 unspecified: Secondary | ICD-10-CM | POA: Diagnosis not present

## 2019-05-09 DIAGNOSIS — B351 Tinea unguium: Secondary | ICD-10-CM | POA: Diagnosis not present

## 2019-05-09 DIAGNOSIS — L84 Corns and callosities: Secondary | ICD-10-CM | POA: Diagnosis not present

## 2019-05-09 DIAGNOSIS — M79675 Pain in left toe(s): Secondary | ICD-10-CM

## 2019-05-09 NOTE — Patient Instructions (Signed)
Peripheral Neuropathy Peripheral neuropathy is a type of nerve damage. It affects nerves that carry signals between the spinal cord and the arms, legs, and the rest of the body (peripheral nerves). It does not affect nerves in the spinal cord or brain. In peripheral neuropathy, one nerve or a group of nerves may be damaged. Peripheral neuropathy is a broad category that includes many specific nerve disorders, like diabetic neuropathy, hereditary neuropathy, and carpal tunnel syndrome. What are the causes? This condition may be caused by:  Diabetes. This is the most common cause of peripheral neuropathy.  Nerve injury.  Pressure or stress on a nerve that lasts a long time.  Lack (deficiency) of B vitamins. This can result from alcoholism, poor diet, or a restricted diet.  Infections.  Autoimmune diseases, such as rheumatoid arthritis and systemic lupus erythematosus.  Nerve diseases that are passed from parent to child (inherited).  Some medicines, such as cancer medicines (chemotherapy).  Poisonous (toxic) substances, such as lead and mercury.  Too little blood flowing to the legs.  Kidney disease.  Thyroid disease. In some cases, the cause of this condition is not known. What are the signs or symptoms? Symptoms of this condition depend on which of your nerves is damaged. Common symptoms include:  Loss of feeling (numbness) in the feet, hands, or both.  Tingling in the feet, hands, or both.  Burning pain.  Very sensitive skin.  Weakness.  Not being able to move a part of the body (paralysis).  Muscle twitching.  Clumsiness or poor coordination.  Loss of balance.  Not being able to control your bladder.  Feeling dizzy.  Sexual problems. How is this diagnosed? Diagnosing and finding the cause of peripheral neuropathy can be difficult. Your health care provider will take your medical history and do a physical exam. A neurological exam will also be done. This  involves checking things that are affected by your brain, spinal cord, and nerves (nervous system). For example, your health care provider will check your reflexes, how you move, and what you can feel. You may have other tests, such as:  Blood tests.  Electromyogram (EMG) and nerve conduction tests. These tests check nerve function and how well the nerves are controlling the muscles.  Imaging tests, such as CT scans or MRI to rule out other causes of your symptoms.  Removing a small piece of nerve to be examined in a lab (nerve biopsy). This is rare.  Removing and examining a small amount of the fluid that surrounds the brain and spinal cord (lumbar puncture). This is rare. How is this treated? Treatment for this condition may involve:  Treating the underlying cause of the neuropathy, such as diabetes, kidney disease, or vitamin deficiencies.  Stopping medicines that can cause neuropathy, such as chemotherapy.  Medicine to relieve pain. Medicines may include: ? Prescription or over-the-counter pain medicine. ? Antiseizure medicine. ? Antidepressants. ? Pain-relieving patches that are applied to painful areas of skin.  Surgery to relieve pressure on a nerve or to destroy a nerve that is causing pain.  Physical therapy to help improve movement and balance.  Devices to help you move around (assistive devices). Follow these instructions at home: Medicines  Take over-the-counter and prescription medicines only as told by your health care provider. Do not take any other medicines without first asking your health care provider.  Do not drive or use heavy machinery while taking prescription pain medicine. Lifestyle   Do not use any products that contain nicotine   or tobacco, such as cigarettes and e-cigarettes. Smoking keeps blood from reaching damaged nerves. If you need help quitting, ask your health care provider.  Avoid or limit alcohol. Too much alcohol can cause a vitamin B  deficiency, and vitamin B is needed for healthy nerves.  Eat a healthy diet. This includes: ? Eating foods that are high in fiber, such as fresh fruits and vegetables, whole grains, and beans. ? Limiting foods that are high in fat and processed sugars, such as fried or sweet foods. General instructions   If you have diabetes, work closely with your health care provider to keep your blood sugar under control.  If you have numbness in your feet: ? Check every day for signs of injury or infection. Watch for redness, warmth, and swelling. ? Wear padded socks and comfortable shoes. These help protect your feet.  Develop a good support system. Living with peripheral neuropathy can be stressful. Consider talking with a mental health specialist or joining a support group.  Use assistive devices and attend physical therapy as told by your health care provider. This may include using a walker or a cane.  Keep all follow-up visits as told by your health care provider. This is important. Contact a health care provider if:  You have new signs or symptoms of peripheral neuropathy.  You are struggling emotionally from dealing with peripheral neuropathy.  Your pain is not well-controlled. Get help right away if:  You have an injury or infection that is not healing normally.  You develop new weakness in an arm or leg.  You fall frequently. Summary  Peripheral neuropathy is when the nerves in the arms, or legs are damaged, resulting in numbness, weakness, or pain.  There are many causes of peripheral neuropathy, including diabetes, pinched nerves, vitamin deficiencies, autoimmune disease, and hereditary conditions.  Diagnosing and finding the cause of peripheral neuropathy can be difficult. Your health care provider will take your medical history, do a physical exam, and do tests, including blood tests and nerve function tests.  Treatment involves treating the underlying cause of the  neuropathy and taking medicines to help control pain. Physical therapy and assistive devices may also help. This information is not intended to replace advice given to you by your health care provider. Make sure you discuss any questions you have with your health care provider. Document Revised: 12/12/2016 Document Reviewed: 03/10/2016 Elsevier Patient Education  [place] are small areas of thickened skin that occur on the top, sides, or tip of a toe. They contain a cone-shaped core with a point that can press on a nerve below. This causes pain.  Calluses are areas of thickened skin that can occur anywhere on the body, including the hands, fingers, palms, soles of the feet, and heels. Calluses are usually larger than corns. What are the causes? Corns and calluses are caused by rubbing (friction) or pressure, such as from shoes that are too tight or do not fit properly. What increases the risk? Corns are more likely to develop in people who have misshapen toes (toe deformities), such as hammer toes. Calluses can occur with friction to any area of the skin. They are more likely to develop in people who:  Work with their hands.  Wear shoes that fit poorly, are too tight, or are high-heeled.  Have toe deformities. What are the signs or symptoms? Symptoms of a corn or callus include:  A hard growth on the skin.  Pain  or tenderness under the skin.  Redness and swelling.  Increased discomfort while wearing tight-fitting shoes, if your feet are affected. If a corn or callus becomes infected, symptoms may include:  Redness and swelling that gets worse.  Pain.  Fluid, blood, or pus draining from the corn or callus. How is this diagnosed? Corns and calluses may be diagnosed based on your symptoms, your medical history, and a physical exam. How is this treated? Treatment for corns and calluses may include:  Removing the cause of the friction or  pressure. This may involve: ? Changing your shoes. ? Wearing shoe inserts (orthotics) or other protective layers in your shoes, such as a corn pad. ? Wearing gloves.  Applying medicine to the skin (topical medicine) to help soften skin in the hardened, thickened areas.  Removing layers of dead skin with a file to reduce the size of the corn or callus.  Removing the corn or callus with a scalpel or laser.  Taking antibiotic medicines, if your corn or callus is infected.  Having surgery, if a toe deformity is the cause. Follow these instructions at home:   Take over-the-counter and prescription medicines only as told by your health care provider.  If you were prescribed an antibiotic, take it as told by your health care provider. Do not stop taking it even if your condition starts to improve.  Wear shoes that fit well. Avoid wearing high-heeled shoes and shoes that are too tight or too loose.  Wear any padding, protective layers, gloves, or orthotics as told by your health care provider.  Soak your hands or feet and then use a file or pumice stone to soften your corn or callus. Do this as told by your health care provider.  Check your corn or callus every day for symptoms of infection. Contact a health care provider if you:  Notice that your symptoms do not improve with treatment.  Have redness or swelling that gets worse.  Notice that your corn or callus becomes painful.  Have fluid, blood, or pus coming from your corn or callus.  Have new symptoms. Summary  Corns are small areas of thickened skin that occur on the top, sides, or tip of a toe.  Calluses are areas of thickened skin that can occur anywhere on the body, including the hands, fingers, palms, and soles of the feet. Calluses are usually larger than corns.  Corns and calluses are caused by rubbing (friction) or pressure, such as from shoes that are too tight or do not fit properly.  Treatment may include wearing  any padding, protective layers, gloves, or orthotics as told by your health care provider. This information is not intended to replace advice given to you by your health care provider. Make sure you discuss any questions you have with your health care provider. Document Revised: 04/21/2018 Document Reviewed: 11/12/2016 Elsevier Patient Education  2020 [employer].

## 2019-05-13 ENCOUNTER — Encounter: Payer: Self-pay | Admitting: Podiatry

## 2019-05-13 NOTE — Progress Notes (Signed)
Subjective: Jacob [name] presents today for follow up of at risk foot care. Patient has h/o CKD stage 3 and callus(es) b/l feet and painful mycotic toenails b/l that are difficult to trim. Pain interferes with ambulation. Aggravating factors include wearing enclosed shoe gear. Pain is relieved with periodic professional debridement..   No Known Allergies   Objective: There were no vitals filed for this visit.  Pt is a pleasant 74 y.o. year old Caucasian male  in NAD. AAO x 3.   Vascular Examination:  Capillary refill time to digits immediate b/l. Palpable DP pulses b/l. Palpable PT pulses b/l. Pedal hair absent b/l Skin temperature gradient within normal limits b/l.  Dermatological Examination: Pedal skin with normal turgor, texture and tone bilaterally. No open wounds bilaterally. No interdigital macerations bilaterally. Toenails 1-5 b/l elongated, dystrophic, thickened, crumbly with subungual debris and tenderness to dorsal palpation. Hyperkeratotic lesion(s) submet head 4 left foot and submet head 4 right foot.  No erythema, no edema, no drainage, no flocculence.  Musculoskeletal: Normal muscle strength 5/5 to all lower extremity muscle groups bilaterally. No pain crepitus or joint limitation noted with ROM b/l. Hallux valgus with bunion deformity noted b/l. Pes planus deformity noted b/l.   Neurological: Protective sensation decreased with 10 gram monofilament b/l.  Assessment: 1. Pain due to onychomycosis of toenails of both feet   2. Callus   3. Anemia in stage 3 chronic kidney disease, unspecified whether stage 3a or 3b CKD    Plan: -Toenails 1-5 b/l were debrided in length and girth with sterile nail nippers and dremel without iatrogenic bleeding.  -Callus(es) submet head 4 left foot and submet head 4 right foot pared utilizing sterile scalpel blade without complication or incident. Total number debrided =2. -Patient to continue soft, supportive shoe gear daily. -Patient  to report any pedal injuries to medical professional immediately. -Patient/POA to call should there be question/concern in the interim.  Return in about 3 months (around 08/08/2019) for nail and callus trim.

## 2019-06-21 ENCOUNTER — Inpatient Hospital Stay: Payer: Medicare Other

## 2019-06-21 ENCOUNTER — Inpatient Hospital Stay: Payer: Medicare Other | Attending: Oncology | Admitting: Oncology

## 2019-06-21 ENCOUNTER — Other Ambulatory Visit: Payer: Self-pay

## 2019-06-21 VITALS — BP 157/99 | HR 82 | Temp 98.1°F | Resp 18 | Ht 74.0 in | Wt 244.1 lb

## 2019-06-21 DIAGNOSIS — Z8546 Personal history of malignant neoplasm of prostate: Secondary | ICD-10-CM | POA: Diagnosis not present

## 2019-06-21 DIAGNOSIS — D649 Anemia, unspecified: Secondary | ICD-10-CM | POA: Insufficient documentation

## 2019-06-21 DIAGNOSIS — Z79899 Other long term (current) drug therapy: Secondary | ICD-10-CM | POA: Insufficient documentation

## 2019-06-21 DIAGNOSIS — D472 Monoclonal gammopathy: Secondary | ICD-10-CM | POA: Diagnosis not present

## 2019-06-21 DIAGNOSIS — C169 Malignant neoplasm of stomach, unspecified: Secondary | ICD-10-CM | POA: Insufficient documentation

## 2019-06-21 DIAGNOSIS — D631 Anemia in chronic kidney disease: Secondary | ICD-10-CM

## 2019-06-21 DIAGNOSIS — Z923 Personal history of irradiation: Secondary | ICD-10-CM | POA: Insufficient documentation

## 2019-06-21 DIAGNOSIS — N189 Chronic kidney disease, unspecified: Secondary | ICD-10-CM

## 2019-06-21 LAB — CMP (CANCER CENTER ONLY)
ALT: 19 U/L (ref 0–44)
AST: 24 U/L (ref 15–41)
Albumin: 3.2 g/dL — ABNORMAL LOW (ref 3.5–5.0)
Alkaline Phosphatase: 54 U/L (ref 38–126)
Anion gap: 8 (ref 5–15)
BUN: 28 mg/dL — ABNORMAL HIGH (ref 8–23)
CO2: 21 mmol/L — ABNORMAL LOW (ref 22–32)
Calcium: 9.2 mg/dL (ref 8.9–10.3)
Chloride: 111 mmol/L (ref 98–111)
Creatinine: 2.76 mg/dL — ABNORMAL HIGH (ref 0.61–1.24)
GFR, Est AFR Am: 25 mL/min — ABNORMAL LOW (ref >60)
GFR, Estimated: 22 mL/min — ABNORMAL LOW (ref >60)
Glucose, Bld: 95 mg/dL (ref 70–99)
Potassium: 4.5 mmol/L (ref 3.5–5.1)
Sodium: 140 mmol/L (ref 135–145)
Total Bilirubin: 0.5 mg/dL (ref 0.3–1.2)
Total Protein: 6.6 g/dL (ref 6.5–8.1)

## 2019-06-21 LAB — CBC WITH DIFFERENTIAL (CANCER CENTER ONLY)
Abs Immature Granulocytes: 0.02 10*3/uL (ref 0.00–0.07)
Basophils Absolute: 0 10*3/uL (ref 0.0–0.1)
Basophils Relative: 1 %
Eosinophils Absolute: 0.5 10*3/uL (ref 0.0–0.5)
Eosinophils Relative: 8 %
HCT: 29.3 % — ABNORMAL LOW (ref 39.0–52.0)
Hemoglobin: 9.6 g/dL — ABNORMAL LOW (ref 13.0–17.0)
Immature Granulocytes: 0 %
Lymphocytes Relative: 23 %
Lymphs Abs: 1.3 10*3/uL (ref 0.7–4.0)
MCH: 32.5 pg (ref 26.0–34.0)
MCHC: 32.8 g/dL (ref 30.0–36.0)
MCV: 99.3 fL (ref 80.0–100.0)
Monocytes Absolute: 0.5 10*3/uL (ref 0.1–1.0)
Monocytes Relative: 9 %
Neutro Abs: 3.5 10*3/uL (ref 1.7–7.7)
Neutrophils Relative %: 59 %
Platelet Count: 178 10*3/uL (ref 150–400)
RBC: 2.95 MIL/uL — ABNORMAL LOW (ref 4.22–5.81)
RDW: 14.7 % (ref 11.5–15.5)
WBC Count: 5.9 10*3/uL (ref 4.0–10.5)
nRBC: 0 % (ref 0.0–0.2)

## 2019-06-21 NOTE — Progress Notes (Signed)
Hematology and Oncology Follow Up Visit  Jacob [name] [id number] Jun 06, 1945 74 y.o. 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  g.  Secondary diagnoses:  1. Prostate cancer diagnosed in 2013.  He was found to have T1c, Gleason score 3+4 = 7 and a PSA 5.66 at the time of diagnosis.  2.  IgG lambda MGUS diagnosed in 2017.    3.  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   Prior Therapy:  He is status post radiation therapy for definitive treatment for his prostate cancer. Therapy concluded in December 2013. He is status post packed red cell transfusions in April 2017. He is status post bone marrow biopsy in April 2017. Results did not show any plasma cell disorder or myelodysplasia. He is status post IV iron infusion completed in April 2017. This was repeated in September 2017. FOLFOX chemotherapy with cycle 1 to be given on 12/12/2015.  He is S/P cycle 5 of therapy given on 02/20/2016. He is status post distal gastrectomy and Billroth II reconstruction completed on 03/25/2016. The final pathology revealed a T3 N0 residual tumor without any lymphadenopathy involved.  Current therapy: Active surveillance.   Interim History:  [name] presents today for a follow-up visit.  Since the last visit, he reports no major changes in his health.  He denies any nausea vomiting or abdominal pain.  He denies any recent hospitalizations or illnesses.  He denies any hematuria medications and will plan.  Performance status and quality of life is unchanged.      Medications: Updated after review. Current Outpatient Medications  Medication Sig Dispense Refill  . acetaminophen (TYLENOL) 500 MG tablet Take 1,000 mg by mouth daily as needed for moderate pain or headache.    . allopurinol (ZYLOPRIM) 300 MG tablet Take 300 mg by mouth daily.     Marland Kitchen buPROPion (WELLBUTRIN XL) 300 MG 24 hr tablet Take 300 mg by mouth daily.     . calcium carbonate (TUMS - DOSED IN MG ELEMENTAL CALCIUM) 500 MG chewable tablet Chew 3 tablets by mouth 2 (two) times daily as needed for indigestion or heartburn.    . diclofenac Sodium (VOLTAREN) 1 % GEL     . furosemide (LASIX) 40 MG tablet Take 80 mg by mouth every other day.     . gabapentin (NEURONTIN) 300 MG capsule Take 1 capsule (300 mg total) by mouth at bedtime. 10 capsule 0  . gabapentin (NEURONTIN) 800 MG tablet     . hydrALAZINE (APRESOLINE) 25 MG tablet Take 25 mg by mouth 2 (two) times daily.    . Melatonin 10 MG TABS Take 1 tablet by mouth at bedtime as needed (sleep).     . Nebivolol HCl (BYSTOLIC) 20 MG TABS Take 20 mg by mouth daily.     . pantoprazole (PROTONIX) 40 MG tablet Take 1 tablet (40 mg total) by mouth daily before supper. 30 tablet 0  . polycarbophil (FIBERCON) 625 MG tablet Take 1 tablet (625 mg total) by mouth daily. 30 tablet 3  . polyethylene glycol (MIRALAX / GLYCOLAX) 17 g packet Take 17 g by mouth 2 (two) times daily. 14 each 0  . prochlorperazine (COMPAZINE) 10 MG tablet Take 1 tablet (10 mg total) by mouth every 6 (six) hours as needed for nausea or vomiting. 30 tablet 1  . tamsulosin (FLOMAX) 0.4 MG CAPS capsule Take 1 capsule (0.4 mg total)  by mouth daily after supper. 90 capsule 3  . Verapamil HCl CR 300 MG CP24 Take 300 mg by mouth at bedtime.      No current facility-administered medications for this visit.     Allergies:  No Known Allergies  Past Medical History, Surgical history, Social history, and Family History without any changes on update.   Physical Exam:  Blood pressure (!) 157/99, pulse 82, temperature 98.1 F (36.7 C), temperature source Temporal, resp. rate 18, height 6' 2"  (1.88 m), weight 244 lb 1.6 oz (110.7 kg), SpO2 100 %.    ECOG: 1     General appearance: Comfortable appearing without any discomfort Head: Normocephalic without any  trauma Oropharynx: Mucous membranes are moist and pink without any thrush or ulcers. Eyes: Pupils are equal and round reactive to light. Lymph nodes: No cervical, supraclavicular, inguinal or axillary lymphadenopathy.   Heart:regular rate and rhythm.  S1 and S2 without leg edema. Lung: Clear without any rhonchi or wheezes.  No dullness to percussion. Abdomin: Soft, nontender, nondistended with good bowel sounds.  No hepatosplenomegaly. Musculoskeletal: No joint deformity or effusion.  Full range of motion noted. Neurological: No deficits noted on motor, sensory and deep tendon reflex exam. Skin: No petechial rash or dryness.  Appeared moist.        Lab Results: Lab Results  Component Value Date   WBC 5.9 06/21/2019   HGB 9.6 (L) 06/21/2019   HCT 29.3 (L) 06/21/2019   MCV 99.3 06/21/2019   PLT 178 06/21/2019     Chemistry      Component Value Date/Time   NA 140 12/14/2018 1102   NA 141 01/16/2017 0924   K 4.5 12/14/2018 1102   K 4.8 01/16/2017 0924   CL 106 12/14/2018 1102   CO2 24 12/14/2018 1102   CO2 25 01/16/2017 0924   BUN 20 12/14/2018 1102   BUN 24.0 01/16/2017 0924   CREATININE 2.07 (H) 12/14/2018 1102   CREATININE 1.9 (H) 01/16/2017 0924      Component Value Date/Time   CALCIUM 9.9 12/14/2018 1102   CALCIUM 9.8 01/16/2017 0924   ALKPHOS 48 12/14/2018 1102   ALKPHOS 54 01/16/2017 0924   AST 15 12/14/2018 1102   AST 16 01/16/2017 0924   ALT 10 12/14/2018 1102   ALT 10 01/16/2017 0924   BILITOT 0.5 12/14/2018 1102   BILITOT 0.54 01/16/2017 0924     IImpression and Plan:  74 year old man with:    1.  T3N0 gastric cancer presented with adenocarcinoma of the of the distal stomach.     The natural course of his disease was reviewed at this time.  Risk of relapse was also discussed.  Imaging studies obtained in December 2020 showed no evidence of disease relapse.  I recommended continued active surveillance and repeat imaging studies in December 2021 and  concluded that staging scans in 2022.  Risk of relapse remains low at this time.  2.  Anemia: Multifactorial in nature and related to chronic disease and iron deficiency.  We will continue to monitor and recommended continuing iron replacement.  Growth factor support would be considered in the future.   3.  IgG MGUS: He is currently on active surveillance without any evidence of multiple myeloma.  I have recommended continued surveillance with repeat studies in 6 months.   4. Renal insufficiency: Remains stable at this time and unrelated to plasma cell disorder.  5. IV access: Port-A-Cath remains in place and will be flushed periodically.  Port-A-Cath removal  will be done upon completing his 5-year surveillance.  6. Follow-up: In 6 months for repeat evaluation and imaging studies.  He will also return for Port-A-Cath flush every 2 months.  30  minutes were dedicated to this visit. The time was spent on reviewing laboratory data, imaging studies, discussing treatment options,  and answering questions regarding future plan.   Zola Button, MD 6/8/20212:30 PM

## 2019-06-22 ENCOUNTER — Telehealth: Payer: Self-pay | Admitting: Oncology

## 2019-06-22 NOTE — Telephone Encounter (Signed)
Scheduled appt per 6/8 los.  Spoke with pt and he is aware of the appt date and time.

## 2019-08-03 ENCOUNTER — Other Ambulatory Visit: Payer: Self-pay

## 2019-08-03 ENCOUNTER — Ambulatory Visit (AMBULATORY_SURGERY_CENTER): Payer: Self-pay

## 2019-08-03 VITALS — Ht 74.0 in | Wt 245.0 lb

## 2019-08-03 DIAGNOSIS — Z86010 Personal history of colonic polyps: Secondary | ICD-10-CM

## 2019-08-03 NOTE — Progress Notes (Signed)
No egg or soy allergy known to patient  No issues with past sedation with any surgeries or procedures no intubation problems in the past  No diet pills per patient Home 02 use per patient-"at night when I feel like my levels are going down-I use it maybe 2 times a week";  =OV scheduled with Dr. Fuller Plan for patient to be rescheduled for recall colon No blood thinners per patient  Pt denies issues with constipation  No A fib or A flutter  EMMI video to [place] 19 guidelines implemented in PV today   Coupon given to pt in [facility] today   COVID vaccine completed on 03/2019;  Due to the COVID-19 pandemic we are asking patients to follow these guidelines. Please only bring one care partner. Please be aware that your care partner may wait in the car in the parking lot or if they feel like they will be too hot to wait in the car, they may wait in the lobby on the 4th floor. All care partners are required to wear a mask the entire time (we do not have any that we can provide them), they need to practice social distancing, and we will do a Covid check for all patient's and care partners when you arrive. Also we will check their temperature and your temperature. If the care partner waits in their car they need to stay in the parking lot the entire time and we will call them on their cell phone when the patient is ready for discharge so they can bring the car to the front of the building. Also all patient's will need to wear a mask into building.

## 2019-08-04 ENCOUNTER — Encounter: Payer: Self-pay | Admitting: Gastroenterology

## 2019-08-04 ENCOUNTER — Ambulatory Visit: Payer: Medicare Other | Admitting: Gastroenterology

## 2019-08-04 VITALS — BP 150/88 | HR 88 | Ht 74.0 in | Wt 245.0 lb

## 2019-08-04 DIAGNOSIS — Z86010 Personal history of colonic polyps: Secondary | ICD-10-CM | POA: Diagnosis not present

## 2019-08-04 MED ORDER — NA SULFATE-K SULFATE-MG SULF 17.5-3.13-1.6 GM/177ML PO SOLN
1.0000 | Freq: Once | ORAL | 0 refills | Status: AC
Start: 2019-08-04 — End: 2019-08-04

## 2019-08-04 NOTE — Progress Notes (Signed)
    History of Present Illness: This is a 74 year old male with a history of adenomatous colon polyps.  He returns for surveillance colonoscopy.  He was hospitalized in 2024 pneumonia and had either partial small bowel obstruction or ileus that resolved.  He has chronic constipation.  He was recommended to use oxygen at night.  Colonoscopy 06/2017 - Preparation of the colon was fair. - One 4 mm polyp in the cecum, removed with a cold biopsy forceps. Resected and retrieved. - Two 6 to 8 mm polyps in the descending colon and in the transverse colon, removed with a cold snare. Resected and retrieved. - Moderate diverticulosis in the left colon. There was no evidence of diverticular bleeding. - Internal hemorrhoids. - The examination was otherwise normal on direct and retroflexion views.  Current Medications, Allergies, Past Medical History, Past Surgical History, Family History and Social History were reviewed in [employer] record.   Physical Exam: General: Well developed, well nourished, no acute distress Head: Normocephalic and atraumatic Eyes:  sclerae anicteric, EOMI Ears: Normal auditory acuity Mouth: Not examined, mask on during Covid-19 pandemic Lungs: Clear throughout to auscultation Heart: Regular rate and rhythm; no murmurs, rubs or bruits Abdomen: Soft, non tender and non distended. No masses, hepatosplenomegaly or hernias noted. Normal Bowel sounds Rectal: deferred to colonoscopy Musculoskeletal: Symmetrical with no gross deformities  Pulses:  Normal pulses noted Extremities: No clubbing, cyanosis, edema or deformities noted Neurological: Alert oriented x 4, grossly nonfocal Psychological:  Alert and cooperative. Normal mood and affect   Assessment and Recommendations:  1. Personal history of adenomatous colon polyps. Schedule colonoscopy at hospital with an extended bowel prep. The risks (including bleeding, perforation, infection, missed lesions,  medication reactions and possible hospitalization or surgery if complications occur), benefits, and alternatives to colonoscopy with possible biopsy and possible polypectomy were discussed with the patient and they consent to proceed.   2. History of T3N0 gastric adenocarcinoma status post Billroth II in 2017.  3. MGUS  4.  Multifactorial anemia secondary to CKD, MGUS. History of iron deficiency anemia in 2017.   5. Nocturnal hypoxemia.

## 2019-08-04 NOTE — Patient Instructions (Signed)
You have been scheduled for a colonoscopy. Please follow written instructions given to you at your visit today.  Please pick up your prep supplies at the pharmacy within the next 1-3 days. If you use inhalers (even only as needed), please bring them with you on the day of your procedure.   

## 2019-08-04 NOTE — H&P (View-Only) (Signed)
    History of Present Illness: This is a 74 year old male with a history of adenomatous colon polyps.  He returns for surveillance colonoscopy.  He was hospitalized in 2024 pneumonia and had either partial small bowel obstruction or ileus that resolved.  He has chronic constipation.  He was recommended to use oxygen at night.  Colonoscopy 06/2017 - Preparation of the colon was fair. - One 4 mm polyp in the cecum, removed with a cold biopsy forceps. Resected and retrieved. - Two 6 to 8 mm polyps in the descending colon and in the transverse colon, removed with a cold snare. Resected and retrieved. - Moderate diverticulosis in the left colon. There was no evidence of diverticular bleeding. - Internal hemorrhoids. - The examination was otherwise normal on direct and retroflexion views.  Current Medications, Allergies, Past Medical History, Past Surgical History, Family History and Social History were reviewed in [employer] record.   Physical Exam: General: Well developed, well nourished, no acute distress Head: Normocephalic and atraumatic Eyes:  sclerae anicteric, EOMI Ears: Normal auditory acuity Mouth: Not examined, mask on during Covid-19 pandemic Lungs: Clear throughout to auscultation Heart: Regular rate and rhythm; no murmurs, rubs or bruits Abdomen: Soft, non tender and non distended. No masses, hepatosplenomegaly or hernias noted. Normal Bowel sounds Rectal: deferred to colonoscopy Musculoskeletal: Symmetrical with no gross deformities  Pulses:  Normal pulses noted Extremities: No clubbing, cyanosis, edema or deformities noted Neurological: Alert oriented x 4, grossly nonfocal Psychological:  Alert and cooperative. Normal mood and affect   Assessment and Recommendations:  1. Personal history of adenomatous colon polyps. Schedule colonoscopy at hospital with an extended bowel prep. The risks (including bleeding, perforation, infection, missed lesions,  medication reactions and possible hospitalization or surgery if complications occur), benefits, and alternatives to colonoscopy with possible biopsy and possible polypectomy were discussed with the patient and they consent to proceed.   2. History of T3N0 gastric adenocarcinoma status post Billroth II in 2017.  3. MGUS  4.  Multifactorial anemia secondary to CKD, MGUS. History of iron deficiency anemia in 2017.   5. Nocturnal hypoxemia.

## 2019-08-08 ENCOUNTER — Encounter: Payer: Self-pay | Admitting: Podiatry

## 2019-08-08 ENCOUNTER — Ambulatory Visit: Payer: Medicare Other | Admitting: Podiatry

## 2019-08-08 ENCOUNTER — Other Ambulatory Visit: Payer: Self-pay

## 2019-08-08 DIAGNOSIS — M79675 Pain in left toe(s): Secondary | ICD-10-CM | POA: Diagnosis not present

## 2019-08-08 DIAGNOSIS — M79674 Pain in right toe(s): Secondary | ICD-10-CM | POA: Diagnosis not present

## 2019-08-08 DIAGNOSIS — G629 Polyneuropathy, unspecified: Secondary | ICD-10-CM | POA: Diagnosis not present

## 2019-08-08 DIAGNOSIS — B351 Tinea unguium: Secondary | ICD-10-CM | POA: Diagnosis not present

## 2019-08-08 DIAGNOSIS — N183 Chronic kidney disease, stage 3 unspecified: Secondary | ICD-10-CM

## 2019-08-08 DIAGNOSIS — D631 Anemia in chronic kidney disease: Secondary | ICD-10-CM

## 2019-08-08 DIAGNOSIS — L84 Corns and callosities: Secondary | ICD-10-CM

## 2019-08-10 NOTE — Progress Notes (Signed)
Subjective:  Patient ID: Jacob [name], male    DOB: [birth year],  MRN: [id number]  Jacob [name] presents to clinic today for at risk foot care. Patient has h/o CKD stage 3 and painful callus(es) b/l feet and painful thick toenails that are difficult to trim. Pain interferes with ambulation. Aggravating factors include wearing enclosed shoe gear. Pain is relieved with periodic professional debridement..  74 y.o. male presents with the above complaint.  Reports painfully elongated nails to both feet.  Review of Systems: Negative except as noted in the HPI. Past Medical History:  Diagnosis Date  . Anemia    hx iron deficiency  . Anxiety   . Arthritis    gout  . Back pain   . BPH with obstruction/lower urinary tract symptoms   . Chronic kidney disease    "they said I do"  . Constipation   . Depression   . ED (erectile dysfunction)   . Epigastric pain   . Family history of adverse reaction to anesthesia   . GERD (gastroesophageal reflux disease)   . Heartburn   . History of blood transfusion   . History of radiation therapy 11/03/11-12/29/11   prostate  . Hypercholesterolemia   . Hypertension   . Night sweats   . Oxygen deficiency    2x per week at night for sleep  . Post-operative nausea and vomiting 04/09/2016  . Prostate cancer ([place]) 08/14/11   Adenocarcinoma,gleason:3+3=6,& 3+4=7,PSA=5.66  . PUD (peptic ulcer disease)   . Sleep apnea    does not use Cpap but does use O2 @ 2l   . Stomach cancer ([place])   . Ulcer    peptic ulcer hx   Past Surgical History:  Procedure Laterality Date  . colon polyps bx  11/24/06   colon,transverse and rectosigmoid polyps:tubular adenomas and hyperplastic polyps,no high grade dysplasia or malignancy   . duodenal bx  11/24/06   benign  . ESOPHAGOGASTRODUODENOSCOPY N/A 10/27/2015   Procedure: ESOPHAGOGASTRODUODENOSCOPY (EGD);  Surgeon: [name], MD;  Location: [facility];  Service: Endoscopy;  Laterality: N/A;  . EUS N/A  11/08/2015   Procedure: UPPER ENDOSCOPIC ULTRASOUND (EUS) RADIAL;  Surgeon: [name], MD;  Location: [facility];  Service: Endoscopy;  Laterality: N/A;  . GASTRECTOMY N/A 03/25/2016   Procedure: DISTAL GASTRECTOMY;  Surgeon: [name], MD;  Location: [facility];  Service: General;  Laterality: N/A;  . gastric bx  11/24/06   chronic active gastritis,with metaplasia and focal changaes of xanthelasma  . GASTROSTOMY N/A 03/25/2016   Procedure: INSERTION OF FEEDING TUBE;  Surgeon: [name], MD;  Location: [facility];  Service: General;  Laterality: N/A;  . INSERTION PROSTATE RADIATION SEED  12-29-11  . IR GENERIC HISTORICAL  04/01/2016   IR GJ TUBE CHANGE 04/01/2016 Markus Daft, MD [facility]  . IR GENERIC HISTORICAL  04/05/2016   IR GJ TUBE CHANGE 04/05/2016 Markus Daft, MD [facility]  . LAPAROSCOPIC GASTRECTOMY  03/25/2016   Diagnostic laparoscopy, distal gastrectomy with Billroth 2 reconstruction and gastrojejunostomy tube  . LAPAROSCOPY N/A 03/25/2016   Procedure: DIAGNOSTIC LAPAROSCOPY;  Surgeon: [name], MD;  Location: [facility];  Service: General;  Laterality: N/A;  . PORTACATH PLACEMENT Left 12/04/2015   Procedure: INSERTION PORT-A-CATH;  Surgeon: [name], MD;  Location: [facility];  Service: General;  Laterality: Left;  . PROSTATE BIOPSY  08/14/11   Adenocarcinoma/volume=58.51cc,gleason=3+3=6 & 3+4=7  . TONSILLECTOMY     74 years old    Current Outpatient Medications:  .  acetaminophen (TYLENOL)  500 MG tablet, Take 1,000 mg by mouth daily as needed for moderate pain or headache., Disp: , Rfl:  .  allopurinol (ZYLOPRIM) 300 MG tablet, Take 300 mg by mouth daily., Disp: , Rfl:  .  buPROPion (WELLBUTRIN XL) 300 MG 24 hr tablet, Take 300 mg by mouth daily. , Disp: , Rfl:  .  calcium carbonate (TUMS - DOSED IN MG ELEMENTAL CALCIUM) 500 MG chewable tablet, Chew 3 tablets by mouth 2 (two) times daily as needed for indigestion or heartburn., Disp: , Rfl:  .  diclofenac Sodium (VOLTAREN) 1 %  GEL, , Disp: , Rfl:  .  furosemide (LASIX) 40 MG tablet, Take 80 mg by mouth every other day. , Disp: , Rfl:  .  gabapentin (NEURONTIN) 300 MG capsule, Take 1 capsule (300 mg total) by mouth at bedtime., Disp: 10 capsule, Rfl: 0 .  hydrALAZINE (APRESOLINE) 25 MG tablet, Take 25 mg by mouth 2 (two) times daily., Disp: , Rfl:  .  Melatonin 10 MG TABS, Take 1 tablet by mouth at bedtime as needed (sleep). , Disp: , Rfl:  .  Nebivolol HCl (BYSTOLIC) 20 MG TABS, Take 20 mg by mouth daily. , Disp: , Rfl:  .  pantoprazole (PROTONIX) 40 MG tablet, Take 1 tablet (40 mg total) by mouth daily before supper., Disp: 30 tablet, Rfl: 0 .  polycarbophil (FIBERCON) 625 MG tablet, Take 1 tablet (625 mg total) by mouth daily., Disp: 30 tablet, Rfl: 3 .  polyethylene glycol (MIRALAX / GLYCOLAX) 17 g packet, Take 17 g by mouth 2 (two) times daily., Disp: 14 each, Rfl: 0 .  prochlorperazine (COMPAZINE) 10 MG tablet, Take 1 tablet (10 mg total) by mouth every 6 (six) hours as needed for nausea or vomiting., Disp: 30 tablet, Rfl: 1 .  tamsulosin (FLOMAX) 0.4 MG CAPS capsule, Take 1 capsule (0.4 mg total) by mouth daily after supper., Disp: 90 capsule, Rfl: 3 .  Verapamil HCl CR 300 MG CP24, Take 300 mg by mouth at bedtime. , Disp: , Rfl:  No Known Allergies Social History   Occupational History  . Occupation: retired    Fish farm manager: Public house manager  Tobacco Use  . Smoking status: Former Smoker    Packs/day: 1.50    Years: 30.00    Pack years: 45.00    Types: Cigarettes    Quit date: 04/13/2000    Years since quitting: 19.3  . Smokeless tobacco: Never Used  Vaping Use  . Vaping Use: Never used  Substance and Sexual Activity  . Alcohol use: Yes    Alcohol/week: 1.0 standard drink    Types: 1 Cans of beer per week    Comment: a beer about once a week, less than in his youth  . Drug use: No  . Sexual activity: Not Currently    Objective:   Constitutional Jacob [name] is a pleasant 74 y.o. African American  male, obese in NAD.Marland Kitchen AAO x 3.   Vascular Dorsalis pedis pulses palpable bilaterally. Posterior tibial pulses palpable bilaterally. Capillary refill normal to all digits.  No cyanosis or clubbing noted. Pedal hair growth diminished b/l. Lower extremity skin temperature gradient within normal limits. No edema noted b/l lower extremities.  Neurologic Normal speech. Oriented to person, place, and time. Protective sensation decreased with 10 gram monofilament b/l. Vibratory sensation intact b/l. Proprioception intact bilaterally.  Dermatologic Pedal skin with normal turgor, texture and tone b/l.  Toenails are discolored, thick, elongated, dystrophic with pain on palpation x 10 No open wounds.  No skin lesions. No interdigital macerations bilaterally. Hyperkeratotic lesion(s) submet head 4 left foot and submet head 4 right foot.  No erythema, no edema, no drainage, no flocculence.  Orthopedic: Normal muscle strength 5/5 to all lower extremity muscle groups bilaterally. No pain crepitus or joint limitation noted with ROM b/l. Hallux valgus with bunion deformity noted b/l lower extremities. Pes planus deformity noted b/l.  No bony tenderness.    Radiographs: None Assessment:   1. Pain due to onychomycosis of toenails of both feet   2. Callus   3. Anemia in stage 3 chronic kidney disease, unspecified whether stage 3a or 3b CKD   4. Neuropathy    Plan:  Patient was evaluated and treated and all questions answered.  Onychomycosis with pain -Nails palliatively debridement as below -Educated on self-care  Procedure: Nail Debridement Rationale: Pain Type of Debridement: manual, sharp debridement. Instrumentation: Nail nipper, rotary burr. Number of Nails: 10 -Examined patient. -No new findings. No new orders. -Toenails 1-5 b/l were debrided in length and girth with sterile nail nippers and dremel without iatrogenic bleeding.  -Callus(es) submet head 4 left foot and submet head 4 right foot  pared utilizing sterile scalpel blade without complication or incident. Total number debrided =2. -Patient to report any pedal injuries to medical professional immediately. -Patient to continue soft, supportive shoe gear daily. -Patient/POA to call should there be question/concern in the interim.  Return in about 3 months (around 11/08/2019).  Marzetta Board, DPM

## 2019-08-15 ENCOUNTER — Encounter: Payer: Medicare Other | Admitting: Gastroenterology

## 2019-08-20 ENCOUNTER — Other Ambulatory Visit (HOSPITAL_COMMUNITY)
Admission: RE | Admit: 2019-08-20 | Discharge: 2019-08-20 | Disposition: A | Discharge status: Home / Self Care | Payer: Medicare Other | Source: Ambulatory Visit | Attending: Gastroenterology | Admitting: Gastroenterology

## 2019-08-20 DIAGNOSIS — Z20822 Contact with and (suspected) exposure to covid-19: Secondary | ICD-10-CM | POA: Insufficient documentation

## 2019-08-20 DIAGNOSIS — Z01812 Encounter for preprocedural laboratory examination: Secondary | ICD-10-CM | POA: Diagnosis present

## 2019-08-20 LAB — SARS CORONAVIRUS 2 (TAT 6-24 HRS): SARS Coronavirus 2: NEGATIVE

## 2019-08-22 ENCOUNTER — Inpatient Hospital Stay: Payer: Medicare Other | Attending: Oncology

## 2019-08-22 ENCOUNTER — Other Ambulatory Visit: Payer: Self-pay

## 2019-08-22 DIAGNOSIS — D472 Monoclonal gammopathy: Secondary | ICD-10-CM | POA: Insufficient documentation

## 2019-08-22 DIAGNOSIS — N189 Chronic kidney disease, unspecified: Secondary | ICD-10-CM | POA: Insufficient documentation

## 2019-08-22 DIAGNOSIS — Z95828 Presence of other vascular implants and grafts: Secondary | ICD-10-CM

## 2019-08-22 DIAGNOSIS — C169 Malignant neoplasm of stomach, unspecified: Secondary | ICD-10-CM | POA: Insufficient documentation

## 2019-08-22 DIAGNOSIS — D631 Anemia in chronic kidney disease: Secondary | ICD-10-CM | POA: Insufficient documentation

## 2019-08-22 MED ORDER — SODIUM CHLORIDE 0.9% FLUSH
10.0000 mL | Freq: Once | INTRAVENOUS | Status: AC
  Administered 2019-08-22: 10 mL
  Filled 2019-08-22: qty 10

## 2019-08-22 MED ORDER — HEPARIN SOD (PORK) LOCK FLUSH 100 UNIT/ML IV SOLN
500.0000 [IU] | Freq: Once | INTRAVENOUS | Status: AC
  Administered 2019-08-22: 500 [IU]
  Filled 2019-08-22: qty 5

## 2019-08-24 ENCOUNTER — Encounter (HOSPITAL_COMMUNITY): Payer: Self-pay | Admitting: Gastroenterology

## 2019-08-24 ENCOUNTER — Ambulatory Visit (HOSPITAL_COMMUNITY)
Admission: RE | Admit: 2019-08-24 | Discharge: 2019-08-24 | Disposition: A | Discharge status: Home / Self Care | Payer: Medicare Other | Attending: Gastroenterology | Admitting: Gastroenterology

## 2019-08-24 ENCOUNTER — Ambulatory Visit (HOSPITAL_COMMUNITY): Payer: Medicare Other | Admitting: Registered Nurse

## 2019-08-24 ENCOUNTER — Encounter (HOSPITAL_COMMUNITY)
Admission: RE | Disposition: A | Discharge status: Home / Self Care | Payer: Self-pay | Source: Home / Self Care | Attending: Gastroenterology

## 2019-08-24 ENCOUNTER — Other Ambulatory Visit: Payer: Self-pay

## 2019-08-24 DIAGNOSIS — Z79899 Other long term (current) drug therapy: Secondary | ICD-10-CM | POA: Insufficient documentation

## 2019-08-24 DIAGNOSIS — G4730 Sleep apnea, unspecified: Secondary | ICD-10-CM | POA: Insufficient documentation

## 2019-08-24 DIAGNOSIS — Z903 Acquired absence of stomach [part of]: Secondary | ICD-10-CM | POA: Diagnosis not present

## 2019-08-24 DIAGNOSIS — I129 Hypertensive chronic kidney disease with stage 1 through stage 4 chronic kidney disease, or unspecified chronic kidney disease: Secondary | ICD-10-CM | POA: Insufficient documentation

## 2019-08-24 DIAGNOSIS — K5730 Diverticulosis of large intestine without perforation or abscess without bleeding: Secondary | ICD-10-CM | POA: Diagnosis not present

## 2019-08-24 DIAGNOSIS — Z09 Encounter for follow-up examination after completed treatment for conditions other than malignant neoplasm: Secondary | ICD-10-CM | POA: Diagnosis present

## 2019-08-24 DIAGNOSIS — D631 Anemia in chronic kidney disease: Secondary | ICD-10-CM | POA: Insufficient documentation

## 2019-08-24 DIAGNOSIS — R0902 Hypoxemia: Secondary | ICD-10-CM | POA: Diagnosis not present

## 2019-08-24 DIAGNOSIS — Z860100 Personal history of colon polyps, unspecified: Secondary | ICD-10-CM

## 2019-08-24 DIAGNOSIS — K640 First degree hemorrhoids: Secondary | ICD-10-CM

## 2019-08-24 DIAGNOSIS — D472 Monoclonal gammopathy: Secondary | ICD-10-CM | POA: Diagnosis not present

## 2019-08-24 DIAGNOSIS — K635 Polyp of colon: Secondary | ICD-10-CM

## 2019-08-24 DIAGNOSIS — K5904 Chronic idiopathic constipation: Secondary | ICD-10-CM | POA: Insufficient documentation

## 2019-08-24 DIAGNOSIS — Z87891 Personal history of nicotine dependence: Secondary | ICD-10-CM | POA: Diagnosis not present

## 2019-08-24 DIAGNOSIS — K648 Other hemorrhoids: Secondary | ICD-10-CM | POA: Insufficient documentation

## 2019-08-24 DIAGNOSIS — Z85028 Personal history of other malignant neoplasm of stomach: Secondary | ICD-10-CM | POA: Insufficient documentation

## 2019-08-24 HISTORY — PX: POLYPECTOMY: SHX5525

## 2019-08-24 HISTORY — PX: COLONOSCOPY WITH PROPOFOL: SHX5780

## 2019-08-24 SURGERY — COLONOSCOPY WITH PROPOFOL
Anesthesia: Monitor Anesthesia Care

## 2019-08-24 MED ORDER — PROPOFOL 1000 MG/100ML IV EMUL
INTRAVENOUS | Status: AC
  Filled 2019-08-24: qty 100

## 2019-08-24 MED ORDER — PROPOFOL 10 MG/ML IV BOLUS
INTRAVENOUS | Status: DC | PRN
  Administered 2019-08-24: 50 mg via INTRAVENOUS

## 2019-08-24 MED ORDER — SODIUM CHLORIDE 0.9 % IV SOLN
INTRAVENOUS | Status: DC
  Administered 2019-08-24: 1000 mL via INTRAVENOUS

## 2019-08-24 MED ORDER — LIDOCAINE 2% (20 MG/ML) 5 ML SYRINGE
INTRAMUSCULAR | Status: DC | PRN
  Administered 2019-08-24: 60 mg via INTRAVENOUS

## 2019-08-24 MED ORDER — PROPOFOL 500 MG/50ML IV EMUL
INTRAVENOUS | Status: DC | PRN
  Administered 2019-08-24: 125 ug/kg/min via INTRAVENOUS

## 2019-08-24 MED ORDER — LACTATED RINGERS IV SOLN: INTRAVENOUS | Status: DC

## 2019-08-24 SURGICAL SUPPLY — 22 items

## 2019-08-24 NOTE — Anesthesia Procedure Notes (Signed)
Date/Time: 08/24/2019 10:30 AM Performed by: Talbot Grumbling, CRNA Oxygen Delivery Method: Simple face mask

## 2019-08-24 NOTE — Anesthesia Postprocedure Evaluation (Signed)
Anesthesia Post Note  Patient: Jacob [name]  Procedure(s) Performed: COLONOSCOPY WITH PROPOFOL (N/A ) POLYPECTOMY     Patient location during evaluation: PACU Anesthesia Type: MAC Level of consciousness: awake and alert Pain management: pain level controlled Vital Signs Assessment: post-procedure vital signs reviewed and stable Respiratory status: spontaneous breathing Cardiovascular status: stable Anesthetic complications: no   No complications documented.  Last Vitals:  Vitals:   08/24/19 1110 08/24/19 1120  BP: (!) 152/77 (!) 164/84  Pulse: (!) 56 (!) 55  Resp: 14 13  Temp:    SpO2: 97% 99%    Last Pain:  Vitals:   08/24/19 1120  TempSrc:   PainSc: 0-No pain                 Nolon Nations

## 2019-08-24 NOTE — Anesthesia Preprocedure Evaluation (Addendum)
Anesthesia Evaluation  Patient identified by MRN, date of birth, ID band Patient awake    Reviewed: Allergy & Precautions, NPO status , Patient's Chart, lab work & pertinent test results  History of Anesthesia Complications (+) PONV, Family history of anesthesia reaction and history of anesthetic complications  Airway Mallampati: II  TM Distance: >3 FB Neck ROM: Full    Dental  (+) Edentulous Upper, Edentulous Lower, Dental Advisory Given   Pulmonary sleep apnea and Oxygen sleep apnea , pneumonia, former smoker,    Pulmonary exam normal breath sounds clear to auscultation       Cardiovascular hypertension, Pt. on medications and Pt. on home beta blockers Normal cardiovascular exam Rhythm:Regular Rate:Normal     Neuro/Psych PSYCHIATRIC DISORDERS Anxiety Depression negative neurological ROS     GI/Hepatic PUD, GERD  ,  Endo/Other    Renal/GU Renal InsufficiencyRenal disease     Musculoskeletal  (+) Arthritis ,   Abdominal (+) + obese,   Peds  Hematology  (+) anemia ,   Anesthesia Other Findings   Reproductive/Obstetrics                            Anesthesia Physical Anesthesia Plan  ASA: III  Anesthesia Plan: MAC   Post-op Pain Management:    Induction: Intravenous  PONV Risk Score and Plan: 2 and Propofol infusion, TIVA and Treatment may vary due to age or medical condition  Airway Management Planned: Natural Airway  Additional Equipment: None  Intra-op Plan:   Post-operative Plan:   Informed Consent: I have reviewed the patients History and Physical, chart, labs and discussed the procedure including the risks, benefits and alternatives for the proposed anesthesia with the patient or authorized representative who has indicated his/her understanding and acceptance.     Dental advisory given  Plan Discussed with: CRNA  Anesthesia Plan Comments:        Anesthesia  Quick Evaluation

## 2019-08-24 NOTE — Op Note (Signed)
[facility] Patient Name: Jacob [name] Procedure Date: 08/24/2019 MRN: [id number] Attending MD: Gerrit Heck , MD Date of Birth: December 06, 1945 CSN: [id number] Age: 74 Admit Type: Inpatient Procedure:                Colonoscopy Indications:              High risk colon cancer surveillance: Personal                            history of colonic polyps, Surveillance: History of                            adenomatous polyps, inadequate prep on last exam                            (<53yr), Incidental - Chronic idiopathic constipation Providers:                Gerrit Heck, MD, Wynonia Sours, RN, Janee Morn, Technician, Frederich Cha RRT, Technician Referring MD:              Medicines:                Monitored Anesthesia Care Complications:            No immediate complications. Estimated Blood Loss:     Estimated blood loss was minimal. Procedure:                Pre-Anesthesia Assessment:                           - Prior to the procedure, a History and Physical                            was performed, and patient medications and                            allergies were reviewed. The patient's tolerance of                            previous anesthesia was also reviewed. The risks                            and benefits of the procedure and the sedation                            options and risks were discussed with the patient.                            All questions were answered, and informed consent                            was obtained. Prior Anticoagulants: The patient has  taken no previous anticoagulant or antiplatelet                            agents. ASA Grade Assessment: III - A patient with                            severe systemic disease. After reviewing the risks                            and benefits, the patient was deemed in                            satisfactory condition to  undergo the procedure.                           After obtaining informed consent, the colonoscope                            was passed under direct vision. Throughout the                            procedure, the patient's blood pressure, pulse, and                            oxygen saturations were monitored continuously. The                            CF-HQ190L ([id number]) Olympus colonoscope was                            introduced through the anus and advanced to the the                            terminal ileum. The colonoscopy was performed                            without difficulty. The patient tolerated the                            procedure well. The quality of the bowel                            preparation was adequate. The terminal ileum,                            ileocecal valve, appendiceal orifice, and rectum                            were photographed. Scope In: 10:33:01 AM Scope Out: 10:58:29 AM Scope Withdrawal Time: 0 hours 18 minutes 3 seconds  Total Procedure Duration: 0 hours 25 minutes 28 seconds  Findings:      The perianal and digital rectal examinations were normal.      Four sessile polyps were found in the sigmoid colon. The polyps were 2  to 4 mm in size. These polyps were removed with a cold snare. Resection       and retrieval were complete. Estimated blood loss was minimal.      A few small-mouthed diverticula were found in the sigmoid colon.      Non-bleeding internal hemorrhoids were found during retroflexion. The       hemorrhoids were small.      A moderate amount of solid stool and solid food debris was found       scattered in the sigmoid colon, in the ascending colon and in the cecum,       making visualization difficult. Lavage of the area was performed using       copious amounts of tap water, resulting in clearance with adequate       visualization of >95% of the mucosal surface.      The terminal ileum appeared  normal. Impression:               - Four 2 to 4 mm polyps in the sigmoid colon,                            removed with a cold snare. Resected and retrieved.                           - Diverticulosis in the sigmoid colon.                           - Non-bleeding internal hemorrhoids.                           - Stool in the sigmoid colon, in the ascending                            colon and in the cecum.                           - The examined portion of the ileum was normal. Moderate Sedation:      Not Applicable - Patient had care per Anesthesia. Recommendation:           - Patient has a contact number available for                            emergencies. The signs and symptoms of potential                            delayed complications were discussed with the                            patient. Return to normal activities tomorrow.                            Written discharge instructions were provided to the                            patient.                           -  Resume previous diet.                           - Continue present medications.                           - Await pathology results.                           - Repeat colonoscopy in 3 - 5 years for                            surveillance.                           - Return to GI office PRN. Procedure Code(s):        --- Professional ---                           [phone], Colonoscopy, flexible; with removal of                            tumor(s), polyp(s), or other lesion(s) by snare                            technique Diagnosis Code(s):        --- Professional ---                           K63.5, Polyp of colon                           K64.8, Other hemorrhoids                           Z86.010, Personal history of colonic polyps                           K57.30, Diverticulosis of large intestine without                            perforation or abscess without bleeding CPT copyright 2019 American Medical  Association. All rights reserved. The codes documented in this report are preliminary and upon coder review may  be revised to meet current compliance requirements. Gerrit Heck, MD 08/24/2019 11:09:52 AM Number of Addenda: 0

## 2019-08-24 NOTE — Interval H&P Note (Signed)
History and Physical Interval Note:  08/24/2019 10:23 AM  Jacob [name]  has presented today for surgery, with the diagnosis of Hx of colon polyps, pt is on O2.  The various methods of treatment have been discussed with the patient and family. After consideration of risks, benefits and other options for treatment, the patient has consented to  Procedure(s): COLONOSCOPY WITH PROPOFOL (N/A) as a surgical intervention.  The patient's history has been reviewed, patient examined, no change in status, stable for surgery.  I have reviewed the patient's chart and labs.  Questions were answered to the patient's satisfaction.     [name]

## 2019-08-24 NOTE — Discharge Instructions (Signed)
YOU HAD AN ENDOSCOPIC PROCEDURE TODAY: Refer to the procedure report and other information in the discharge instructions given to you for any specific questions about what was found during the examination. If this information does not answer your questions, please call [place] office at [phone] to clarify.  ° °YOU SHOULD EXPECT: Some feelings of bloating in the abdomen. Passage of more gas than usual. Walking can help get rid of the air that was put into your GI tract during the procedure and reduce the bloating. If you had a lower endoscopy (such as a colonoscopy or flexible sigmoidoscopy) you may notice spotting of blood in your stool or on the toilet paper. Some abdominal soreness may be present for a day or two, also. ° °DIET: Your first meal following the procedure should be a light meal and then it is ok to progress to your normal diet. A half-sandwich or bowl of soup is an example of a good first meal. Heavy or fried foods are harder to digest and may make you feel nauseous or bloated. Drink plenty of fluids but you should avoid alcoholic beverages for 24 hours. If you had a esophageal dilation, please see attached instructions for diet.   ° °ACTIVITY: Your care partner should take you home directly after the procedure. You should plan to take it easy, moving slowly for the rest of the day. You can resume normal activity the day after the procedure however YOU SHOULD NOT DRIVE, use power tools, machinery or perform tasks that involve climbing or major physical exertion for 24 hours (because of the sedation medicines used during the test).  ° °SYMPTOMS TO REPORT IMMEDIATELY: °A gastroenterologist can be reached at any hour. Please call [phone]  for any of the following symptoms:  °Following lower endoscopy (colonoscopy, flexible sigmoidoscopy) °Excessive amounts of blood in the stool  °Significant tenderness, worsening of abdominal pains  °Swelling of the abdomen that is new, acute  °Fever of 100° or  higher  °Following upper endoscopy (EGD, EUS, ERCP, esophageal dilation) °Vomiting of blood or coffee ground material  °New, significant abdominal pain  °New, significant chest pain or pain under the shoulder blades  °Painful or persistently difficult swallowing  °New shortness of breath  °Black, tarry-looking or red, bloody stools ° °FOLLOW UP:  °If any biopsies were taken you will be contacted by phone or by letter within the next 1-3 weeks. Call [phone]  if you have not heard about the biopsies in 3 weeks.  °Please also call with any specific questions about appointments or follow up tests. ° °

## 2019-08-24 NOTE — Transfer of Care (Signed)
Immediate Anesthesia Transfer of Care Note  Patient: Ossiel Marchio  Procedure(s) Performed: COLONOSCOPY WITH PROPOFOL (N/A ) POLYPECTOMY  Patient Location: PACU  Anesthesia Type:MAC  Level of Consciousness: sedated  Airway & Oxygen Therapy: Patient Spontanous Breathing and Patient connected to face mask oxygen  Post-op Assessment: Report given to RN and Post -op Vital signs reviewed and stable  Post vital signs: Reviewed and stable  Last Vitals:  Vitals Value Taken Time  BP    Temp    Pulse 58 08/24/19 1103  Resp 37 08/24/19 1102  SpO2 100 % 08/24/19 1103  Vitals shown include unvalidated device data.  Last Pain:  Vitals:   08/24/19 0941  TempSrc: Oral  PainSc: 0-No pain         Complications: No complications documented.

## 2019-08-25 ENCOUNTER — Encounter (HOSPITAL_COMMUNITY): Payer: Self-pay | Admitting: Gastroenterology

## 2019-08-25 ENCOUNTER — Encounter: Payer: Self-pay | Admitting: Gastroenterology

## 2019-08-25 LAB — SURGICAL PATHOLOGY

## 2019-10-21 ENCOUNTER — Inpatient Hospital Stay: Payer: Medicare Other | Attending: Oncology

## 2019-10-21 ENCOUNTER — Other Ambulatory Visit: Payer: Self-pay

## 2019-10-21 DIAGNOSIS — D631 Anemia in chronic kidney disease: Secondary | ICD-10-CM

## 2019-10-21 DIAGNOSIS — Z95828 Presence of other vascular implants and grafts: Secondary | ICD-10-CM

## 2019-10-21 DIAGNOSIS — N189 Chronic kidney disease, unspecified: Secondary | ICD-10-CM | POA: Insufficient documentation

## 2019-10-21 MED ORDER — HEPARIN SOD (PORK) LOCK FLUSH 100 UNIT/ML IV SOLN
500.0000 [IU] | Freq: Once | INTRAVENOUS | Status: AC
  Administered 2019-10-21: 500 [IU]
  Filled 2019-10-21: qty 5

## 2019-10-21 MED ORDER — SODIUM CHLORIDE 0.9% FLUSH
10.0000 mL | Freq: Once | INTRAVENOUS | Status: AC
  Administered 2019-10-21: 10 mL
  Filled 2019-10-21: qty 10

## 2019-10-21 NOTE — Patient Instructions (Signed)

## 2019-10-27 ENCOUNTER — Other Ambulatory Visit: Payer: Self-pay | Admitting: General Surgery

## 2019-11-08 ENCOUNTER — Ambulatory Visit: Payer: Medicare Other | Admitting: Podiatry

## 2019-12-12 ENCOUNTER — Other Ambulatory Visit: Payer: Self-pay | Admitting: Oncology

## 2019-12-16 ENCOUNTER — Other Ambulatory Visit (HOSPITAL_COMMUNITY)
Admission: RE | Admit: 2019-12-16 | Discharge: 2019-12-16 | Disposition: A | Discharge status: Home / Self Care | Payer: Medicare Other | Source: Ambulatory Visit | Attending: General Surgery | Admitting: General Surgery

## 2019-12-16 DIAGNOSIS — Z01812 Encounter for preprocedural laboratory examination: Secondary | ICD-10-CM | POA: Insufficient documentation

## 2019-12-16 DIAGNOSIS — Z20822 Contact with and (suspected) exposure to covid-19: Secondary | ICD-10-CM | POA: Diagnosis not present

## 2019-12-16 LAB — SARS CORONAVIRUS 2 (TAT 6-24 HRS): SARS Coronavirus 2: NEGATIVE

## 2019-12-19 ENCOUNTER — Ambulatory Visit (HOSPITAL_COMMUNITY): Payer: Medicare Other | Admitting: Vascular Surgery

## 2019-12-19 ENCOUNTER — Encounter (HOSPITAL_COMMUNITY): Payer: Self-pay | Admitting: General Surgery

## 2019-12-19 NOTE — Progress Notes (Signed)
Anesthesia Chart Review: Jacob [name]   Case: [id number] Date/Time: 12/20/19 0715   Procedure: PORT REMOVAL (N/A )   Anesthesia type: Monitor Anesthesia Care   Pre-op diagnosis: HISTORY OF GASTRIC CANCER   Location: [place] OR ROOM 02 / [place] [facility]   Surgeons: Jacob Klein, MD      DISCUSSION: Patient is a 74 year old male scheduled for the above procedure. S/p left subclavian Port 12/04/15 following diagnosis of gastric adenocarcinoma treated with chemotherapy and surgery. At 10/07/19 general surgery follow-up, no evidence of recurrence. Now plans to remove Port.  History includes former smoker (quit 04/13/00), post-operative N/V, HTN, hypercholesterolemia, CKD, stomach cancer (presented with bleeding gastric ulcer, biopsy gastric adenocarcinoma 10/2015; s/p chemotherapy followed by distal gastrectomy with Billroth 2 reconstruction/GJ tube 03/25/16), prostate cancer (s/p radiation therapy 2013), GERD, OSA (uses 2L/[place] some nights). Family history of anesthesia reaction had been documented, but when I asked him he denied any knowledge of this, so this was removed from his history.   - Hospitalized 09/09/18-09/19/18 with SBO (treated with NGT), multifocal PNA (negative COVID-19; s/p Rocephin and azithromycin), acute on chronic kidney disease (CR 2.46->4.35) with mild metabolic acidosis. HS Troponin 22->19 felt due to AKI. EKG with ST changes but with significant baseline wanderer. He was managed by the Hospitalist team with general surgery consulting. Stable condition at discharge.    12/16/2019 preprocedure COVID-19 test negative.  He is a same-day work-up.  Preoperative labs and EKG on the day of surgery as indicated. He has known CKD and anemia. Anesthesia team to evaluate on the day of procedure.   BP Readings from Last 3 Encounters:  08/24/19 (!) 164/84  08/04/19 (!) 150/88  06/21/19 (!) 157/99   Pulse Readings from Last 3 Encounters:  08/24/19 (!) 55  08/04/19 88  06/21/19 82    PROVIDERS: PCP is  with [facility]. Previously he saw Prince Solian, MD.  Zola Button, MD is Teryl Lucy, MD is GI Pearson Grippe, MD is nephrologist   LABS: He is for updated labs on arrival. Currently, most recent lab results include: Lab Results  Component Value Date   WBC 5.9 06/21/2019   HGB 9.6 (L) 06/21/2019   HCT 29.3 (L) 06/21/2019   PLT 178 06/21/2019   GLUCOSE 95 06/21/2019   ALT 19 06/21/2019   AST 24 06/21/2019   NA 140 06/21/2019   K 4.5 06/21/2019   CL 111 06/21/2019   CREATININE 2.76 (H) 06/21/2019   BUN 28 (H) 06/21/2019   CO2 21 (L) 06/21/2019   Lab Results  Component Value Date   CREATININE 2.76 (H) 06/21/2019   CREATININE 2.07 (H) 12/14/2018   CREATININE 2.46 (H) 09/19/2018     OTHER: Colonoscopy 08/24/19: Impression: - Four 2 to 4 mm polyps in the sigmoid colon, removed with a cold snare. Resected and retrieved. - Diverticulosis in the sigmoid colon. - Non-bleeding internal hemorrhoids. - Stool in the sigmoid colon, in the ascending colon and in the cecum. - The examined portion of the ileum was normal. (Pathology colon, sigmoid, polypectomy: colonic mucosa and food matter, no adenomatous change or carcinoma)  PFTs > 5 years ago.   EKG: For day of procedure.    CV: N/A  Past Medical History:  Diagnosis Date  . Anemia    hx iron deficiency  . Anxiety   . Arthritis    gout  . Back pain   . BPH with obstruction/lower urinary tract symptoms   . Chronic kidney disease    "  they said I do"  . Constipation   . Depression   . ED (erectile dysfunction)   . Epigastric pain   . GERD (gastroesophageal reflux disease)   . Heartburn   . History of blood transfusion   . History of radiation therapy 11/03/11-12/29/11   prostate  . Hypercholesterolemia   . Hypertension   . Night sweats   . Oxygen deficiency    2x per week at night for sleep  . Post-operative nausea and vomiting 04/09/2016  . Prostate cancer ([place]) 08/14/11    Adenocarcinoma,gleason:3+3=6,& 3+4=7,PSA=5.66  . PUD (peptic ulcer disease)   . Sleep apnea    does not use Cpap but does use O2 @ 2l   . Stomach cancer ([place])   . Ulcer    peptic ulcer hx    Past Surgical History:  Procedure Laterality Date  . colon polyps bx  11/24/06   colon,transverse and rectosigmoid polyps:tubular adenomas and hyperplastic polyps,no high grade dysplasia or malignancy   . COLONOSCOPY WITH PROPOFOL N/A 08/24/2019   Procedure: COLONOSCOPY WITH PROPOFOL;  Surgeon: [name], DO;  Location: [facility];  Service: Gastroenterology;  Laterality: N/A;  . duodenal bx  11/24/06   benign  . ESOPHAGOGASTRODUODENOSCOPY N/A 10/27/2015   Procedure: ESOPHAGOGASTRODUODENOSCOPY (EGD);  Surgeon: [name], MD;  Location: [facility];  Service: Endoscopy;  Laterality: N/A;  . EUS N/A 11/08/2015   Procedure: UPPER ENDOSCOPIC ULTRASOUND (EUS) RADIAL;  Surgeon: [name], MD;  Location: [facility];  Service: Endoscopy;  Laterality: N/A;  . GASTRECTOMY N/A 03/25/2016   Procedure: DISTAL GASTRECTOMY;  Surgeon: Jacob Klein, MD;  Location: [place];  Service: General;  Laterality: N/A;  . gastric bx  11/24/06   chronic active gastritis,with metaplasia and focal changaes of xanthelasma  . GASTROSTOMY N/A 03/25/2016   Procedure: INSERTION OF FEEDING TUBE;  Surgeon: Jacob Klein, MD;  Location: [place];  Service: General;  Laterality: N/A;  . INSERTION PROSTATE RADIATION SEED  12-29-11  . IR GENERIC HISTORICAL  04/01/2016   IR GJ TUBE CHANGE 04/01/2016 Markus Daft, MD [facility]  . IR GENERIC HISTORICAL  04/05/2016   IR GJ TUBE CHANGE 04/05/2016 Markus Daft, MD [facility]  . LAPAROSCOPIC GASTRECTOMY  03/25/2016   Diagnostic laparoscopy, distal gastrectomy with Billroth 2 reconstruction and gastrojejunostomy tube  . LAPAROSCOPY N/A 03/25/2016   Procedure: DIAGNOSTIC LAPAROSCOPY;  Surgeon: Jacob Klein, MD;  Location: [place];  Service: General;  Laterality: N/A;  . POLYPECTOMY   08/24/2019   Procedure: POLYPECTOMY;  Surgeon: [name], DO;  Location: [facility];  Service: Gastroenterology;;  . Sol Passer PLACEMENT Left 12/04/2015   Procedure: INSERTION PORT-A-CATH;  Surgeon: Jacob Klein, MD;  Location: [place];  Service: General;  Laterality: Left;  . PROSTATE BIOPSY  08/14/11   Adenocarcinoma/volume=58.51cc,gleason=3+3=6 & 3+4=7  . TONSILLECTOMY     74 years old    MEDICATIONS: No current facility-administered medications for this encounter.   Marland Kitchen acetaminophen (TYLENOL) 500 MG tablet  . allopurinol (ZYLOPRIM) 300 MG tablet  . buPROPion (WELLBUTRIN XL) 300 MG 24 hr tablet  . calcium carbonate (TUMS - DOSED IN MG ELEMENTAL CALCIUM) 500 MG chewable tablet  . cetirizine (ZYRTEC ALLERGY) 10 MG tablet  . diclofenac Sodium (VOLTAREN) 1 % GEL  . furosemide (LASIX) 20 MG tablet  . gabapentin (NEURONTIN) 400 MG capsule  . hydrALAZINE (APRESOLINE) 25 MG tablet  . Melatonin 10 MG TABS  . Nebivolol HCl (BYSTOLIC) 20 MG TABS  . polycarbophil (FIBERCON) 625 MG tablet  .  polyethylene glycol (MIRALAX / GLYCOLAX) 17 g packet  . prochlorperazine (COMPAZINE) 10 MG tablet  . sodium bicarbonate 650 MG tablet  . tamsulosin (FLOMAX) 0.4 MG CAPS capsule  . venlafaxine (EFFEXOR) 75 MG tablet  . verapamil (CALAN) 80 MG tablet  . pantoprazole (PROTONIX) 40 MG tablet     Myra Gianotti, PA-C Surgical Short Stay/Anesthesiology [facility] Phone [phone] [facility] Phone [phone] 12/19/2019 2:14 PM

## 2019-12-19 NOTE — Progress Notes (Signed)
Spoke with pt for pre-op call. Pt denies cardiac history or diabetes. Pt is treated for HTN.  Covid test done 12/16/19 and it's negative.  Pt states he's been in quarantine since the test was done and understands that he stays in quarantine until he comes to the hospital tomorrow.

## 2019-12-19 NOTE — Anesthesia Preprocedure Evaluation (Addendum)
Anesthesia Evaluation  Patient identified by MRN, date of birth, ID band Patient awake    Reviewed: Allergy & Precautions, NPO status , Patient's Chart, lab work & pertinent test results, reviewed documented beta blocker date and time   History of Anesthesia Complications (+) PONV and history of anesthetic complications  Airway Mallampati: III  TM Distance: >3 FB Neck ROM: Full    Dental  (+) Edentulous Upper, Edentulous Lower   Pulmonary sleep apnea and Oxygen sleep apnea , former smoker,    Pulmonary exam normal        Cardiovascular hypertension, Pt. on home beta blockers and Pt. on medications Normal cardiovascular exam     Neuro/Psych PSYCHIATRIC DISORDERS Anxiety Depression negative neurological ROS     GI/Hepatic Neg liver ROS, PUD, GERD  Medicated and Controlled, Hx gastric cancer    Endo/Other   Obesity   Renal/GU CRFRenal disease    Prostate cancer     Musculoskeletal  (+) Arthritis ,   Abdominal   Peds  Hematology negative hematology ROS (+)   Anesthesia Other Findings Covid test negative   Reproductive/Obstetrics                           Anesthesia Physical Anesthesia Plan  ASA: III  Anesthesia Plan: MAC   Post-op Pain Management:    Induction: Intravenous  PONV Risk Score and Plan: 2 and Propofol infusion and Treatment may vary due to age or medical condition  Airway Management Planned: Natural Airway and Simple Face Mask  Additional Equipment: None  Intra-op Plan:   Post-operative Plan:   Informed Consent: I have reviewed the patients History and Physical, chart, labs and discussed the procedure including the risks, benefits and alternatives for the proposed anesthesia with the patient or authorized representative who has indicated his/her understanding and acceptance.       Plan Discussed with: CRNA and Anesthesiologist  Anesthesia Plan  Comments:       Anesthesia Quick Evaluation

## 2019-12-19 NOTE — H&P (Signed)
Jacob [name] Location: [facility] Patient #: [id number] DOB: 04-Feb-1945 Married / Language: English / Race: Black or African American Male   History of Present Illness  The patient is a 74 year old male who presents for a follow-up for Gastric cancer. This is a 74 year old male diagnosed with gastric cancer October 2017. He was been being followed for prostate cancer diagnosed in 2013. He received radiation treatment as definitive therapy for that. He was seen to be anemic and early 2017 and had a bone marrow biopsy. 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 He got iron infusion in April and September. He was hospitalized on October 14 after presenting to the emergency department with melena. EGD was performed which showed a partially circumferential large antral mass. Biopsies were positive for adenocarcinoma. Endoscopic ultrasound was also performed which did not show any evidence of linitis plastica. It was staged as a uT3N2 cancer. He did have some weight loss of 15-20 pounds during that time. He received FOLFOX neoadjuvant chemotherapy. He had some thrombocytopenia and had a delay for a cycle.  He underwent dx laparoscopy and distal gastrectomy 03/25/2016. He had a hard time post op and had to be discharged to a SNF. He had significant issues with his feeding tube post op. He originally had a GJ tube, but the J tube clogged. This was exchanged, and then the J tube portion refluxed into the stomach. He had issues with delayed gastric emptying for a while and had to get TNA while his G tube was open to gravity.   He had imaging in April 2020 and december 2020. This was negative for evidence of recurrence. He has had a colonoscopy and EGD 06/16/17 with no evidence of recurrent gastric cancer, and no colon cancer was seen. He had several adenomatous polyps in his colon. Follow up colonoscopy 08/2019 did not show any adenomatous polyps. He continues follow up  with Dr. Alen Blew.   Pt is now having a little weight gain which is an improvement. He denies n/v. He isn't having any change in his stools. His strength is good. He has no new health problems.    CT CAP 12/2018 IMPRESSION: 1. No acute findings and no specific findings identified to suggest residual or recurrence of tumor or metastatic disease. 2. Aortic atherosclerosis. Three vessel coronary artery calcifications noted. 3. Decrease in size of fluid collection along the undersurface of the ventral abdominal wall compatible with sequelae of omental infarct.  Aortic Atherosclerosis (ICD10-I70.0).   Allergies  No Known Allergies   No Known Drug Allergies  Allergies Reconciled   Medication History MiraLax (1 (one) Oral DAILY, Taken starting 09/06/2018) Active. Allopurinol (300MG Tablet, Oral) Active. BuPROPion HCl ER (XL) (300MG Tablet ER 24HR, Oral) Active. Furosemide (80MG Tablet, Oral) Active. Gabapentin (800MG Tablet, Oral) Active. Bystolic (75TZ Tablet, Oral) Active. Tamsulosin HCl (0.4MG Capsule, Oral) Active. TraZODone HCl (50MG Tablet, Oral) Active. Verapamil HCl ER (300MG Capsule ER 24HR, Oral) Active. Glucerna 1.0 Cal (Oral) Specific strength unknown - Active. Xalatan (0.005% Solution, Ophthalmic) Active. Melatonin ER (10MG Tablet ER, Oral) Active. Omeprazole (Oral) Specific strength unknown - Active. Lidocaine-Prilocaine (2.5-2.5% Cream, External) Active. Medications Reconciled    Review of Systems All other systems negative  Vitals Weight: 250.8 lb Height: 74in Body Surface Area: 2.39 m Body Mass Index: 32.2 kg/m  Temp.: 61F (Temporal)  Pulse: 79 (Regular)  P.OX: 95% (Room air) BP: 140/78(Sitting, Left Arm, Standard)     Physical Exam General Mental Status-Alert. General Appearance-Consistent with  stated age. Hydration-Well hydrated. Voice-Normal. Note: gait more unsteady   Head and Neck  Head-normocephalic, atraumatic with no lesions or palpable masses.  Eye Sclera/Conjunctiva - Bilateral-No scleral icterus.  Chest and Lung Exam Chest and lung exam reveals -quiet, even and easy respiratory effort with no use of accessory muscles. Inspection Chest Wall - Normal. Back - normal.  Cardiovascular Cardiovascular examination reveals -normal pedal pulses bilaterally. Note: regular rate and rhythm  Abdomen Inspection-Inspection Normal. Palpation/Percussion Palpation and Percussion of the abdomen reveal - Soft, No Rebound tenderness, No Rigidity (guarding) and No hepatosplenomegaly. Note: mild tenderness of lower incision. Cannot palpate a hernia. Note: Omental infarct no longer palpable.    Peripheral Vascular Upper Extremity Inspection - Bilateral - Normal - No Clubbing, No Cyanosis, No Edema, Pulses Intact. Lower Extremity Palpation - Edema - Bilateral - No edema - Bilateral.  Neurologic Neurologic evaluation reveals -alert and oriented x 3 with no impairment of recent or remote memory. Mental Status-Normal.  Musculoskeletal Global Assessment -Note: no gross deformities.  Normal Exam - Left-Upper Extremity Strength Normal and Lower Extremity Strength Normal. Normal Exam - Right-Upper Extremity Strength Normal and Lower Extremity Strength Normal.  Lymphatic Head & Neck  General Head & Neck Lymphatics: Bilateral - Description - Normal. Axillary  General Axillary Region: Bilateral - Description - Normal. Tenderness - Non Tender.    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  Follow up wtih Dr. Alen Blew 12/2019  I will see him back in 1 year. OMENTAL INFARCTION (K55.069) Impression: Appears resolved. HISTORY OF PROSTATE CANCER (Z85.46) Impression: Followed by Dr. [facility] IN PLACE [phone]) Impression: Will communicate with Dr. Alen Blew about whether  this can be removed. Current Plans Follow up in 1 year or as needed

## 2019-12-20 ENCOUNTER — Inpatient Hospital Stay (HOSPITAL_COMMUNITY)
Admission: RE | Admit: 2019-12-20 | Discharge: 2019-12-22 | DRG: 641 | Disposition: A | Discharge status: Home / Self Care | Payer: Medicare Other | Attending: Internal Medicine | Admitting: Internal Medicine

## 2019-12-20 ENCOUNTER — Encounter (HOSPITAL_COMMUNITY)
Admission: RE | Disposition: A | Discharge status: Home / Self Care | Payer: Self-pay | Source: Home / Self Care | Attending: Internal Medicine

## 2019-12-20 ENCOUNTER — Encounter (HOSPITAL_COMMUNITY): Payer: Self-pay | Admitting: General Surgery

## 2019-12-20 ENCOUNTER — Other Ambulatory Visit: Payer: Self-pay

## 2019-12-20 ENCOUNTER — Observation Stay (HOSPITAL_COMMUNITY): Payer: Medicare Other

## 2019-12-20 DIAGNOSIS — Z5309 Procedure and treatment not carried out because of other contraindication: Secondary | ICD-10-CM | POA: Diagnosis present

## 2019-12-20 DIAGNOSIS — Z79899 Other long term (current) drug therapy: Secondary | ICD-10-CM

## 2019-12-20 DIAGNOSIS — Z20822 Contact with and (suspected) exposure to covid-19: Secondary | ICD-10-CM | POA: Diagnosis present

## 2019-12-20 DIAGNOSIS — Z8546 Personal history of malignant neoplasm of prostate: Secondary | ICD-10-CM

## 2019-12-20 DIAGNOSIS — E872 Acidosis, unspecified: Secondary | ICD-10-CM | POA: Diagnosis present

## 2019-12-20 DIAGNOSIS — E875 Hyperkalemia: Secondary | ICD-10-CM | POA: Diagnosis present

## 2019-12-20 DIAGNOSIS — D696 Thrombocytopenia, unspecified: Secondary | ICD-10-CM | POA: Diagnosis present

## 2019-12-20 DIAGNOSIS — N183 Chronic kidney disease, stage 3 unspecified: Secondary | ICD-10-CM

## 2019-12-20 DIAGNOSIS — G4730 Sleep apnea, unspecified: Secondary | ICD-10-CM | POA: Diagnosis present

## 2019-12-20 DIAGNOSIS — Z87891 Personal history of nicotine dependence: Secondary | ICD-10-CM

## 2019-12-20 DIAGNOSIS — E162 Hypoglycemia, unspecified: Secondary | ICD-10-CM | POA: Diagnosis present

## 2019-12-20 DIAGNOSIS — N184 Chronic kidney disease, stage 4 (severe): Secondary | ICD-10-CM | POA: Diagnosis present

## 2019-12-20 DIAGNOSIS — Z85028 Personal history of other malignant neoplasm of stomach: Secondary | ICD-10-CM | POA: Diagnosis present

## 2019-12-20 DIAGNOSIS — Z923 Personal history of irradiation: Secondary | ICD-10-CM

## 2019-12-20 DIAGNOSIS — E669 Obesity, unspecified: Secondary | ICD-10-CM | POA: Diagnosis present

## 2019-12-20 DIAGNOSIS — Z9079 Acquired absence of other genital organ(s): Secondary | ICD-10-CM

## 2019-12-20 DIAGNOSIS — I160 Hypertensive urgency: Secondary | ICD-10-CM | POA: Diagnosis not present

## 2019-12-20 DIAGNOSIS — E160 Drug-induced hypoglycemia without coma: Secondary | ICD-10-CM | POA: Diagnosis present

## 2019-12-20 DIAGNOSIS — Z9221 Personal history of antineoplastic chemotherapy: Secondary | ICD-10-CM

## 2019-12-20 DIAGNOSIS — Z6834 Body mass index (BMI) 34.0-34.9, adult: Secondary | ICD-10-CM

## 2019-12-20 DIAGNOSIS — Z903 Acquired absence of stomach [part of]: Secondary | ICD-10-CM

## 2019-12-20 DIAGNOSIS — T383X5A Adverse effect of insulin and oral hypoglycemic [antidiabetic] drugs, initial encounter: Secondary | ICD-10-CM

## 2019-12-20 DIAGNOSIS — I129 Hypertensive chronic kidney disease with stage 1 through stage 4 chronic kidney disease, or unspecified chronic kidney disease: Secondary | ICD-10-CM | POA: Diagnosis present

## 2019-12-20 DIAGNOSIS — D631 Anemia in chronic kidney disease: Secondary | ICD-10-CM | POA: Diagnosis present

## 2019-12-20 LAB — GLUCOSE, CAPILLARY
Glucose-Capillary: 74 mg/dL (ref 70–99)
Glucose-Capillary: 65 mg/dL — ABNORMAL LOW (ref 70–99)
Glucose-Capillary: 154 mg/dL — ABNORMAL HIGH (ref 70–99)
Glucose-Capillary: 63 mg/dL — ABNORMAL LOW (ref 70–99)
Glucose-Capillary: 60 mg/dL — ABNORMAL LOW (ref 70–99)
Glucose-Capillary: 128 mg/dL — ABNORMAL HIGH (ref 70–99)
Glucose-Capillary: 84 mg/dL (ref 70–99)

## 2019-12-20 LAB — BASIC METABOLIC PANEL
Anion gap: 9 (ref 5–15)
Anion gap: 6 (ref 5–15)
Anion gap: 8 (ref 5–15)
BUN: 32 mg/dL — ABNORMAL HIGH (ref 8–23)
BUN: 31 mg/dL — ABNORMAL HIGH (ref 8–23)
BUN: 29 mg/dL — ABNORMAL HIGH (ref 8–23)
CO2: 19 mmol/L — ABNORMAL LOW (ref 22–32)
CO2: 21 mmol/L — ABNORMAL LOW (ref 22–32)
CO2: 21 mmol/L — ABNORMAL LOW (ref 22–32)
Calcium: 9.4 mg/dL (ref 8.9–10.3)
Calcium: 9 mg/dL (ref 8.9–10.3)
Calcium: 9.4 mg/dL (ref 8.9–10.3)
Chloride: 111 mmol/L (ref 98–111)
Chloride: 110 mmol/L (ref 98–111)
Chloride: 109 mmol/L (ref 98–111)
Creatinine, Ser: 2.84 mg/dL — ABNORMAL HIGH (ref 0.61–1.24)
Creatinine, Ser: 2.56 mg/dL — ABNORMAL HIGH (ref 0.61–1.24)
Creatinine, Ser: 2.57 mg/dL — ABNORMAL HIGH (ref 0.61–1.24)
GFR, Estimated: 23 mL/min — ABNORMAL LOW (ref >60)
GFR, Estimated: 26 mL/min — ABNORMAL LOW (ref >60)
GFR, Estimated: 25 mL/min — ABNORMAL LOW (ref >60)
Glucose, Bld: 70 mg/dL (ref 70–99)
Glucose, Bld: 48 mg/dL — ABNORMAL LOW (ref 70–99)
Glucose, Bld: 75 mg/dL (ref 70–99)
Potassium: 7.4 mmol/L (ref 3.5–5.1)
Potassium: 5.7 mmol/L — ABNORMAL HIGH (ref 3.5–5.1)
Potassium: 5.6 mmol/L — ABNORMAL HIGH (ref 3.5–5.1)
Sodium: 139 mmol/L (ref 135–145)
Sodium: 137 mmol/L (ref 135–145)
Sodium: 138 mmol/L (ref 135–145)

## 2019-12-20 LAB — POCT I-STAT, CHEM 8
BUN: 37 mg/dL — ABNORMAL HIGH (ref 8–23)
Calcium, Ion: 1.29 mmol/L (ref 1.15–1.40)
Chloride: 114 mmol/L — ABNORMAL HIGH (ref 98–111)
Creatinine, Ser: 2.9 mg/dL — ABNORMAL HIGH (ref 0.61–1.24)
Glucose, Bld: 73 mg/dL (ref 70–99)
HCT: 28 % — ABNORMAL LOW (ref 39.0–52.0)
Hemoglobin: 9.5 g/dL — ABNORMAL LOW (ref 13.0–17.0)
Potassium: 6.9 mmol/L (ref 3.5–5.1)
Sodium: 138 mmol/L (ref 135–145)
TCO2: 22 mmol/L (ref 22–32)

## 2019-12-20 IMAGING — DX DG CHEST 1V PORT
1 series · 1 of 1 positions shown · non-contrast
Comparison: [DATE]

CLINICAL DATA: Hyperkalemia

EXAM:
PORTABLE CHEST 1 VIEW

[chest ap]
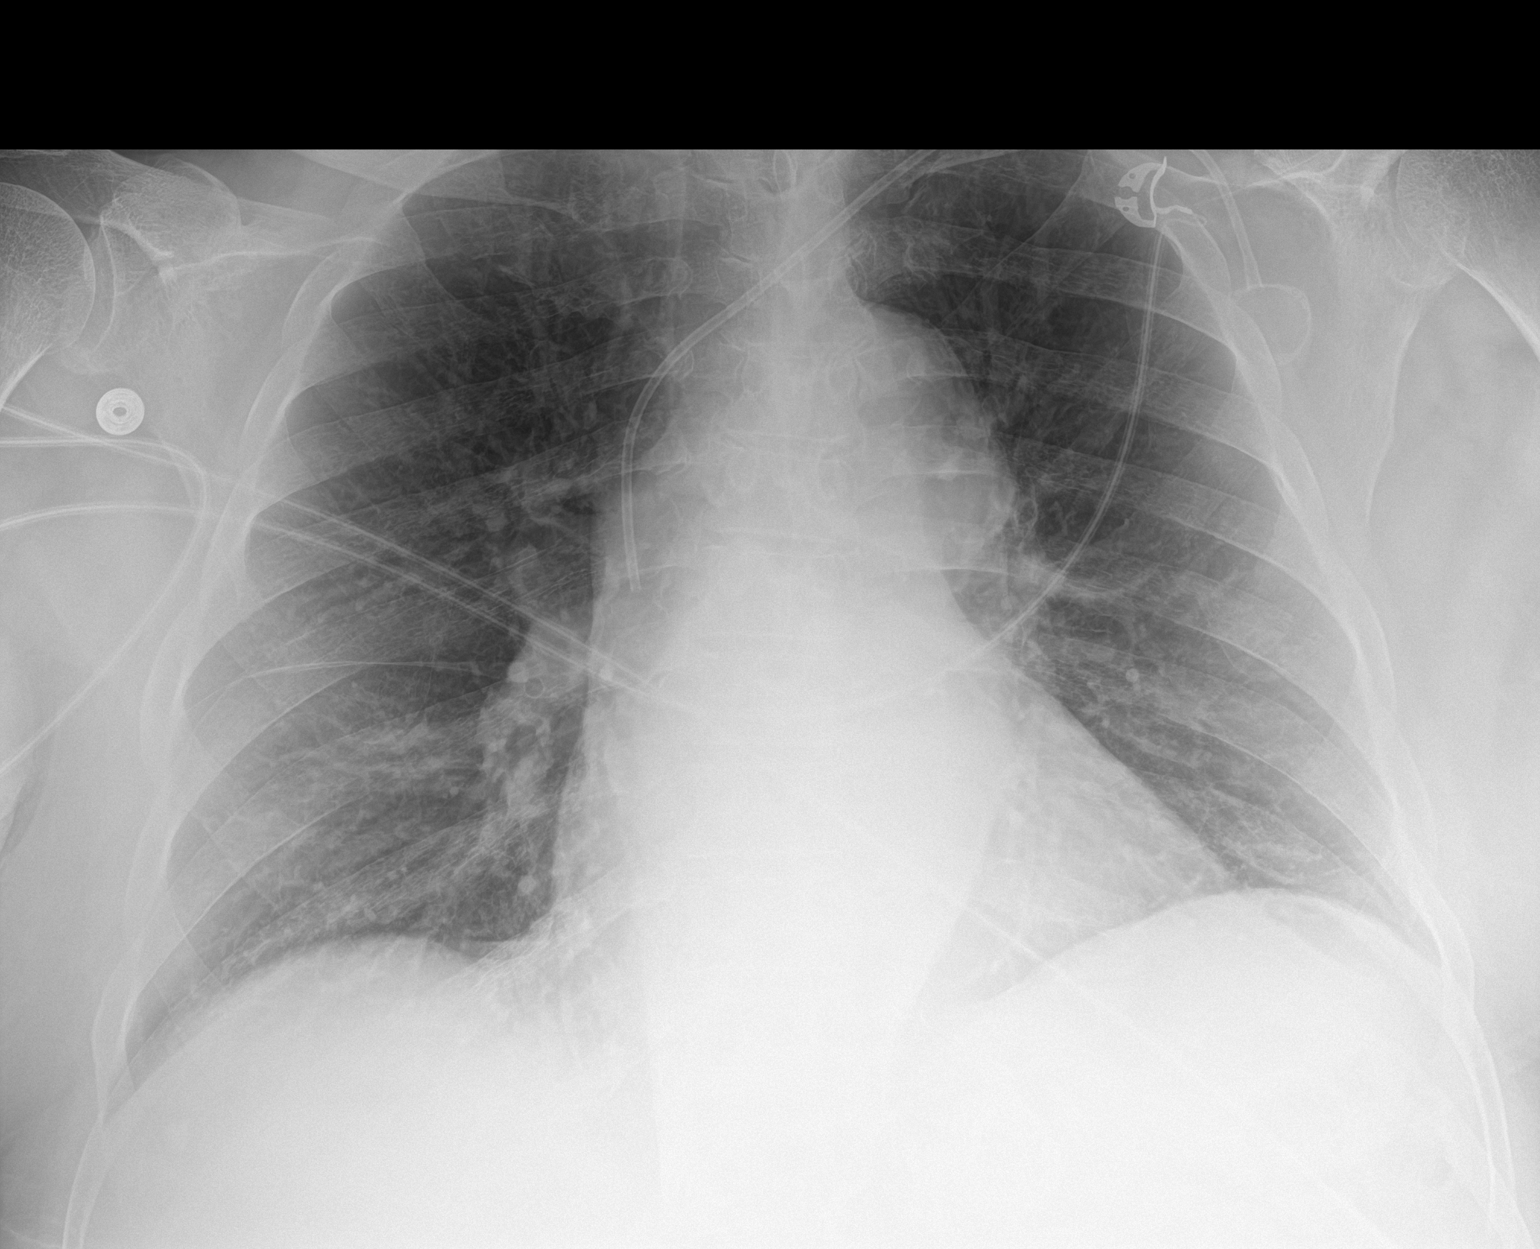

[1 of 1 positions shown; findings below may reference images not displayed]

FINDINGS: Porta catheter on the left with tip at the SVC. There is no edema,
consolidation, effusion, or pneumothorax. Normal heart size. Stable
aortic tortuosity.
IMPRESSION: No evidence of active disease.

## 2019-12-20 SURGERY — REMOVAL PORT-A-CATH
Anesthesia: Monitor Anesthesia Care

## 2019-12-20 MED ORDER — EPHEDRINE 5 MG/ML INJ
INTRAVENOUS | Status: AC
  Filled 2019-12-20: qty 10

## 2019-12-20 MED ORDER — ACETAMINOPHEN 500 MG PO TABS: 1000.0000 mg | ORAL_TABLET | ORAL | Status: AC

## 2019-12-20 MED ORDER — ACETAMINOPHEN 650 MG RE SUPP: 650.0000 mg | Freq: Four times a day (QID) | RECTAL | Status: DC | PRN

## 2019-12-20 MED ORDER — ALBUTEROL SULFATE (2.5 MG/3ML) 0.083% IN NEBU: 2.5000 mg | INHALATION_SOLUTION | Freq: Once | RESPIRATORY_TRACT | Status: AC

## 2019-12-20 MED ORDER — ONDANSETRON HCL 4 MG/2ML IJ SOLN
INTRAMUSCULAR | Status: AC
  Filled 2019-12-20: qty 2

## 2019-12-20 MED ORDER — CEFAZOLIN SODIUM-DEXTROSE 2-4 GM/100ML-% IV SOLN
INTRAVENOUS | Status: AC
  Filled 2019-12-20: qty 100

## 2019-12-20 MED ORDER — SODIUM CHLORIDE 0.9% FLUSH
3.0000 mL | Freq: Two times a day (BID) | INTRAVENOUS | Status: DC
  Administered 2019-12-20 – 2019-12-22 (×5): 3 mL via INTRAVENOUS

## 2019-12-20 MED ORDER — SODIUM BICARBONATE 8.4 % IV SOLN
50.0000 meq | Freq: Once | INTRAVENOUS | Status: AC
  Administered 2019-12-20: 50 meq via INTRAVENOUS
  Filled 2019-12-20: qty 50

## 2019-12-20 MED ORDER — VENLAFAXINE HCL 75 MG PO TABS
75.0000 mg | ORAL_TABLET | Freq: Two times a day (BID) | ORAL | Status: DC
  Administered 2019-12-20 – 2019-12-22 (×4): 75 mg via ORAL
  Filled 2019-12-20 (×5): qty 1

## 2019-12-20 MED ORDER — ACETAMINOPHEN 500 MG PO TABS
ORAL_TABLET | ORAL | Status: AC
  Administered 2019-12-20: 1000 mg via ORAL
  Filled 2019-12-20: qty 2

## 2019-12-20 MED ORDER — SODIUM BICARBONATE 650 MG PO TABS
650.0000 mg | ORAL_TABLET | Freq: Two times a day (BID) | ORAL | Status: DC
  Administered 2019-12-20 – 2019-12-22 (×5): 650 mg via ORAL
  Filled 2019-12-20 (×5): qty 1

## 2019-12-20 MED ORDER — CHLORHEXIDINE GLUCONATE 0.12 % MT SOLN
OROMUCOSAL | Status: AC
  Administered 2019-12-20: 15 mL via OROMUCOSAL
  Filled 2019-12-20: qty 15

## 2019-12-20 MED ORDER — CALCIUM GLUCONATE-NACL 1-0.675 GM/50ML-% IV SOLN
1.0000 g | INTRAVENOUS | Status: AC
  Administered 2019-12-20: 1000 mg via INTRAVENOUS
  Filled 2019-12-20: qty 50

## 2019-12-20 MED ORDER — TAMSULOSIN HCL 0.4 MG PO CAPS
0.4000 mg | ORAL_CAPSULE | Freq: Every day | ORAL | Status: DC
  Administered 2019-12-21 – 2019-12-22 (×2): 0.4 mg via ORAL
  Filled 2019-12-20 (×2): qty 1

## 2019-12-20 MED ORDER — SODIUM ZIRCONIUM CYCLOSILICATE 10 G PO PACK
10.0000 g | PACK | Freq: Once | ORAL | Status: AC
  Administered 2019-12-21: 10 g via ORAL

## 2019-12-20 MED ORDER — VERAPAMIL HCL 80 MG PO TABS
80.0000 mg | ORAL_TABLET | Freq: Three times a day (TID) | ORAL | Status: DC
  Administered 2019-12-20 – 2019-12-22 (×6): 80 mg via ORAL
  Filled 2019-12-20 (×8): qty 1

## 2019-12-20 MED ORDER — SODIUM ZIRCONIUM CYCLOSILICATE 10 G PO PACK
10.0000 g | PACK | ORAL | Status: AC
  Administered 2019-12-20: 10 g via ORAL
  Filled 2019-12-20: qty 1

## 2019-12-20 MED ORDER — HEPARIN SODIUM (PORCINE) 5000 UNIT/ML IJ SOLN
5000.0000 [IU] | Freq: Three times a day (TID) | INTRAMUSCULAR | Status: DC
  Administered 2019-12-20 – 2019-12-22 (×6): 5000 [IU] via SUBCUTANEOUS
  Filled 2019-12-20 (×6): qty 1

## 2019-12-20 MED ORDER — DEXTROSE 50 % IV SOLN: 1.0000 | Freq: Once | INTRAVENOUS | Status: DC | PRN

## 2019-12-20 MED ORDER — LIDOCAINE-EPINEPHRINE 1 %-1:100000 IJ SOLN
INTRAMUSCULAR | Status: AC
  Filled 2019-12-20: qty 1

## 2019-12-20 MED ORDER — ALLOPURINOL 300 MG PO TABS
300.0000 mg | ORAL_TABLET | Freq: Every day | ORAL | Status: DC
  Administered 2019-12-20 – 2019-12-22 (×3): 300 mg via ORAL
  Filled 2019-12-20 (×3): qty 1

## 2019-12-20 MED ORDER — SODIUM CHLORIDE 0.9 % IV SOLN: Freq: Once | INTRAVENOUS | Status: AC

## 2019-12-20 MED ORDER — BUPIVACAINE HCL (PF) 0.25 % IJ SOLN
INTRAMUSCULAR | Status: AC
  Filled 2019-12-20: qty 30

## 2019-12-20 MED ORDER — NEBIVOLOL HCL 10 MG PO TABS
20.0000 mg | ORAL_TABLET | Freq: Every day | ORAL | Status: DC
  Administered 2019-12-20 – 2019-12-22 (×3): 20 mg via ORAL
  Filled 2019-12-20: qty 2
  Filled 2019-12-20 (×2): qty 4
  Filled 2019-12-20 (×2): qty 2

## 2019-12-20 MED ORDER — DIPHENHYDRAMINE HCL 25 MG PO CAPS
25.0000 mg | ORAL_CAPSULE | Freq: Every evening | ORAL | Status: DC | PRN
  Administered 2019-12-20: 25 mg via ORAL
  Filled 2019-12-20: qty 1

## 2019-12-20 MED ORDER — BUPROPION HCL ER (XL) 150 MG PO TB24
300.0000 mg | ORAL_TABLET | Freq: Every day | ORAL | Status: DC
  Administered 2019-12-21 – 2019-12-22 (×2): 300 mg via ORAL
  Filled 2019-12-20 (×2): qty 2

## 2019-12-20 MED ORDER — DEXTROSE 50 % IV SOLN
INTRAVENOUS | Status: AC
  Administered 2019-12-20: 50 mL via INTRAVENOUS
  Filled 2019-12-20: qty 50

## 2019-12-20 MED ORDER — LACTATED RINGERS IV SOLN: INTRAVENOUS | Status: DC

## 2019-12-20 MED ORDER — MELATONIN 5 MG PO TABS: 10.0000 mg | ORAL_TABLET | Freq: Every evening | ORAL | Status: DC | PRN

## 2019-12-20 MED ORDER — CHLORHEXIDINE GLUCONATE 0.12 % MT SOLN: 15.0000 mL | Freq: Once | OROMUCOSAL | Status: AC

## 2019-12-20 MED ORDER — DEXTROSE 50 % IV SOLN
50.0000 mL | Freq: Once | INTRAVENOUS | Status: AC
  Filled 2019-12-20: qty 50

## 2019-12-20 MED ORDER — SODIUM ZIRCONIUM CYCLOSILICATE 5 G PO PACK
5.0000 g | PACK | Freq: Every day | ORAL | Status: DC
  Filled 2019-12-20 (×2): qty 1

## 2019-12-20 MED ORDER — CALCIUM CARBONATE ANTACID 500 MG PO CHEW: 3.0000 | CHEWABLE_TABLET | Freq: Two times a day (BID) | ORAL | Status: DC | PRN

## 2019-12-20 MED ORDER — PROPOFOL 10 MG/ML IV BOLUS
INTRAVENOUS | Status: AC
  Filled 2019-12-20: qty 20

## 2019-12-20 MED ORDER — FUROSEMIDE 20 MG PO TABS
20.0000 mg | ORAL_TABLET | Freq: Every day | ORAL | Status: DC
  Administered 2019-12-20 – 2019-12-22 (×3): 20 mg via ORAL
  Filled 2019-12-20 (×3): qty 1

## 2019-12-20 MED ORDER — GLYCOPYRROLATE PF 0.2 MG/ML IJ SOSY
PREFILLED_SYRINGE | INTRAMUSCULAR | Status: AC
  Filled 2019-12-20: qty 1

## 2019-12-20 MED ORDER — GABAPENTIN 400 MG PO CAPS
400.0000 mg | ORAL_CAPSULE | Freq: Every day | ORAL | Status: DC
  Administered 2019-12-20 – 2019-12-21 (×2): 400 mg via ORAL
  Filled 2019-12-20 (×2): qty 1

## 2019-12-20 MED ORDER — ORAL CARE MOUTH RINSE: 15.0000 mL | Freq: Once | OROMUCOSAL | Status: AC

## 2019-12-20 MED ORDER — INSULIN ASPART 100 UNIT/ML ~~LOC~~ SOLN
SUBCUTANEOUS | Status: AC
  Administered 2019-12-20: 10 [IU] via INTRAVENOUS
  Filled 2019-12-20: qty 1

## 2019-12-20 MED ORDER — DEXTROSE 50 % IV SOLN: 50.0000 mL | INTRAVENOUS | Status: DC | PRN

## 2019-12-20 MED ORDER — CHLORHEXIDINE GLUCONATE CLOTH 2 % EX PADS: 6.0000 | MEDICATED_PAD | Freq: Once | CUTANEOUS | Status: DC

## 2019-12-20 MED ORDER — ACETAMINOPHEN 325 MG PO TABS: 650.0000 mg | ORAL_TABLET | Freq: Four times a day (QID) | ORAL | Status: DC | PRN

## 2019-12-20 MED ORDER — DEXTROSE 50 % IV SOLN
INTRAVENOUS | Status: AC
  Filled 2019-12-20: qty 50

## 2019-12-20 MED ORDER — HYDRALAZINE HCL 20 MG/ML IJ SOLN: 10.0000 mg | INTRAMUSCULAR | Status: DC | PRN

## 2019-12-20 MED ORDER — ALBUTEROL SULFATE (2.5 MG/3ML) 0.083% IN NEBU
INHALATION_SOLUTION | RESPIRATORY_TRACT | Status: AC
  Administered 2019-12-20: 2.5 mg via RESPIRATORY_TRACT
  Filled 2019-12-20: qty 3

## 2019-12-20 MED ORDER — PHENYLEPHRINE 40 MCG/ML (10ML) SYRINGE FOR IV PUSH (FOR BLOOD PRESSURE SUPPORT)
PREFILLED_SYRINGE | INTRAVENOUS | Status: AC
  Filled 2019-12-20: qty 10

## 2019-12-20 MED ORDER — HYDRALAZINE HCL 25 MG PO TABS
25.0000 mg | ORAL_TABLET | Freq: Three times a day (TID) | ORAL | Status: DC
  Administered 2019-12-20 – 2019-12-22 (×6): 25 mg via ORAL
  Filled 2019-12-20 (×6): qty 1

## 2019-12-20 MED ORDER — INSULIN ASPART 100 UNIT/ML IV SOLN: 10.0000 [IU] | Freq: Once | INTRAVENOUS | Status: AC

## 2019-12-20 MED ORDER — DEXAMETHASONE SODIUM PHOSPHATE 10 MG/ML IJ SOLN
INTRAMUSCULAR | Status: AC
  Filled 2019-12-20: qty 1

## 2019-12-20 MED ORDER — ALBUTEROL SULFATE (2.5 MG/3ML) 0.083% IN NEBU: 2.5000 mg | INHALATION_SOLUTION | Freq: Four times a day (QID) | RESPIRATORY_TRACT | Status: DC | PRN

## 2019-12-20 MED ORDER — DICLOFENAC SODIUM 1 % EX GEL
1.0000 "application " | Freq: Two times a day (BID) | CUTANEOUS | Status: DC | PRN
  Filled 2019-12-20: qty 100

## 2019-12-20 MED ORDER — CEFAZOLIN SODIUM-DEXTROSE 2-4 GM/100ML-% IV SOLN: 2.0000 g | INTRAVENOUS | Status: DC

## 2019-12-20 NOTE — Progress Notes (Addendum)
iStat results called to Dr. Guss Bunde- K 6.9. Verbal order given to draw STAT BMP. Labs drawn and sent to lab.   [phone]- waiting on BMP results- delay in processing due to lab stating they did not receive sample. Sample was sent at 0714- began processing at [phone]

## 2019-12-20 NOTE — Progress Notes (Signed)
Hypoglycemic Event  CBG: 65  Treatment: 8 oz juice/soda, [place] sandwich meal  Symptoms: Pale  Follow-up CBG: Time: 1304 CBG Result: 154  Possible Reasons for Event: Insulin adminstration  Comments/MD notified: Dr. Tamala Julian, Alfonso Patten; Ok to give food. Patient consumed 8 oz soda prior to starting to eat.     Kreg Shropshire

## 2019-12-20 NOTE — H&P (Incomplete)
History and Physical    Jacob [name] DJT:[id number] DOB: [birth year] DOA: 12/20/2019  Referring MD/NP/PA: Dr. Rolena Infante PCP: Patient, No Pcp Per  Patient coming from: PACU  Chief Complaint: Removal of Port-A-Cath.  I have personally briefly reviewed patient's old medical records in [facility]   HPI: Jacob [name] is a 74 y.o. 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  Patient states that it was initially placed for him to receive chemotherapy 4 years ago.  Since that time patient has had routine surveillance.  Last  EGD was in 2019 showed a single 8 mm sessile polyp.  He most recently had a colonoscopy on 08/24/2019 which revealed 4 sessile polyps in the sigmoid colon which were removed.  He has been followed in the outpatient setting by Dr. Alen Blew of oncology.  Patient states that he was told that he no longer needed the Port-A-Cath and was set up to be removed today.  However, labs revealed patient to have potassium 7.4, BUN 32, creatinine 2.84, and glucose as low as 65.  Patient had been      ED Course: As seen above.  Review of Systems  Cardiovascular: Positive for leg swelling.    Past Medical History:  Diagnosis Date  . Anemia    hx iron deficiency  . Anxiety   . Arthritis    gout  . Back pain   . BPH with obstruction/lower urinary tract symptoms   . Chronic kidney disease    "they said I do"  . Constipation   . Depression   . ED (erectile dysfunction)   . Epigastric pain   . GERD (gastroesophageal reflux disease)   . Heartburn   . History of blood transfusion   . History of radiation therapy 11/03/11-12/29/11   prostate  . Hypercholesterolemia   . Hypertension   . Night sweats   . Oxygen deficiency    2x per week  at night for sleep  . Post-operative nausea and vomiting 04/09/2016  . Prostate cancer ([place]) 08/14/11   Adenocarcinoma,gleason:3+3=6,& 3+4=7,PSA=5.66  . PUD (peptic ulcer disease)   . Sleep apnea    does not use Cpap but does use O2 @ 2l   . Stomach cancer ([place])   . Ulcer    peptic ulcer hx    Past Surgical History:  Procedure Laterality Date  . colon polyps bx  11/24/06   colon,transverse and rectosigmoid polyps:tubular adenomas and hyperplastic polyps,no high grade dysplasia or malignancy   . COLONOSCOPY WITH PROPOFOL N/A 08/24/2019   Procedure: COLONOSCOPY WITH PROPOFOL;  Surgeon: [name], DO;  Location: [facility];  Service: Gastroenterology;  Laterality: N/A;  . duodenal bx  11/24/06   benign  . ESOPHAGOGASTRODUODENOSCOPY N/A 10/27/2015   Procedure: ESOPHAGOGASTRODUODENOSCOPY (EGD);  Surgeon: [name], MD;  [facility];  Service: Endoscopy;  Laterality: N/A;  . EUS N/A 11/08/2015   Procedure: UPPER ENDOSCOPIC ULTRASOUND (EUS) RADIAL;  Surgeon: [name], MD;  Location: [facility];  Service: Endoscopy;  Laterality: N/A;  . GASTRECTOMY N/A 03/25/2016   Procedure: DISTAL GASTRECTOMY;  Surgeon: [name], MD;  Location: [facility];  Service: General;  Laterality: N/A;  . gastric bx  11/24/06   chronic active gastritis,with metaplasia and focal changaes of xanthelasma  . GASTROSTOMY N/A 03/25/2016   Procedure: INSERTION  OF FEEDING TUBE;  Surgeon: [name], MD;  Location: [facility];  Service: General;  Laterality: N/A;  . [place]  12-29-11  . IR GENERIC HISTORICAL  04/01/2016   IR GJ TUBE CHANGE 04/01/2016 Markus Daft, MD [facility]  . IR GENERIC HISTORICAL  04/05/2016   IR GJ TUBE CHANGE 04/05/2016 Markus Daft, MD [facility]  . LAPAROSCOPIC GASTRECTOMY  03/25/2016   Diagnostic laparoscopy, distal gastrectomy with Billroth 2 reconstruction and gastrojejunostomy tube  . LAPAROSCOPY N/A 03/25/2016   Procedure: DIAGNOSTIC  LAPAROSCOPY;  Surgeon: [name], MD;  Location: [place];  Service: General;  Laterality: N/A;  . POLYPECTOMY  08/24/2019   Procedure: POLYPECTOMY;  Surgeon: [name], DO;  Location: [facility];  Service: Gastroenterology;;  . Sol Passer PLACEMENT Left 12/04/2015   Procedure: INSERTION PORT-A-CATH;  Surgeon: [name], MD;  Location: [facility];  Service: General;  Laterality: Left;  . PROSTATE BIOPSY  08/14/11   Adenocarcinoma/volume=58.51cc,gleason=3+3=6 & 3+4=7  . TONSILLECTOMY     74 years old     reports that he quit smoking about 19 years ago. His smoking use included cigarettes. He has a 45.00 pack-year smoking history. He has never used smokeless tobacco. He reports current alcohol use of about 1.0 standard drink of alcohol per week. He reports that he does not use drugs.  No Known Allergies  Family History  Problem Relation Age of Onset  . Cancer Mother        NOS  . Alcohol abuse Father   . Alcohol abuse Brother 50  . Cancer Paternal Aunt        NOS  . Colon cancer Neg Hx     Prior to Admission medications   Medication Sig Start Date End Date Taking? Authorizing Provider  acetaminophen (TYLENOL) 500 MG tablet Take 1,000 mg by mouth daily as needed for moderate pain or headache.   Yes [provider]  allopurinol (ZYLOPRIM) 300 MG tablet Take 300 mg by mouth daily. 04/15/19  Yes [provider]  buPROPion (WELLBUTRIN XL) 300 MG 24 hr tablet Take 300 mg by mouth daily.  05/10/15  Yes [provider]  calcium carbonate (TUMS - DOSED IN MG ELEMENTAL CALCIUM) 500 MG chewable tablet Chew 3 tablets by mouth 2 (two) times daily as needed for indigestion or heartburn.   Yes [provider]  cetirizine (ZYRTEC ALLERGY) 10 MG tablet Take 10 mg by mouth daily.   Yes [provider]  diclofenac Sodium (VOLTAREN) 1 % GEL Apply 1 application topically 2 (two) times daily as needed (pain).  02/19/19  Yes [provider]  furosemide  (LASIX) 20 MG tablet Take 20 mg by mouth daily.    Yes [provider]  gabapentin (NEURONTIN) 400 MG capsule Take 400 mg by mouth.   Yes [provider]  hydrALAZINE (APRESOLINE) 25 MG tablet Take 25 mg by mouth 3 (three) times daily.  08/05/18  Yes [provider]  Melatonin 10 MG TABS Take 10 mg by mouth at bedtime as needed (sleep).    Yes [provider]  Nebivolol HCl (BYSTOLIC) 20 MG TABS Take 20 mg by mouth daily.    Yes [provider]  polycarbophil (FIBERCON) 625 MG tablet Take 1 tablet (625 mg total) by mouth daily. 04/21/16  Yes [name], MD  polyethylene glycol (MIRALAX / GLYCOLAX) 17 g packet Take 17 g by mouth 2 (two) times daily. 09/18/18  Yes Kayleen Memos, DO  prochlorperazine (COMPAZINE)  10 MG tablet TAKE ONE TABLET BY MOUTH EVERY 6 HOURS AS NEEDED FOR NAUSSEA AND VOMITING Patient taking differently: Take 10 mg by mouth every 6 (six) hours as needed for nausea or vomiting.  12/13/19  Yes Wyatt Portela, MD  sodium bicarbonate 650 MG tablet Take 650 mg by mouth 2 (two) times daily.   Yes [provider]  tamsulosin (FLOMAX) 0.4 MG CAPS capsule Take 1 capsule (0.4 mg total) by mouth daily after supper. Patient taking differently: Take 0.4 mg by mouth daily.  03/01/19  Yes McKenzie, Candee Furbish, MD  venlafaxine (EFFEXOR) 75 MG tablet Take 75 mg by mouth 2 (two) times daily.   Yes [provider]  verapamil (CALAN) 80 MG tablet Take 80 mg by mouth 3 (three) times daily.    Yes [provider]  pantoprazole (PROTONIX) 40 MG tablet Take 1 tablet (40 mg total) by mouth daily before supper. Patient not taking: Reported on 12/16/2019 09/18/18   Kayleen Memos, DO    Physical Exam:  Constitutional: NAD, calm, comfortable Vitals:   12/20/19 1250 12/20/19 1255 12/20/19 1300 12/20/19 1305  BP:      Pulse: 81 80 81 80  Resp: 15 18 15  (!) 21  Temp:      TempSrc:      SpO2: 99% 98% 99% 100%  Weight:      Height:        Eyes: PERRL, lids and conjunctivae normal ENMT: Mucous membranes are moist. Posterior pharynx clear of any exudate or lesions.Normal dentition.  Neck: normal, supple, no masses, no thyromegaly Respiratory: clear to auscultation bilaterally, no wheezing, no crackles. Normal respiratory effort. No accessory muscle use.  Cardiovascular: Regular rate and rhythm, no murmurs / rubs / gallops. No extremity edema. 2+ pedal pulses. No carotid bruits.  Abdomen: no tenderness, no masses palpated. No hepatosplenomegaly. Bowel sounds positive.  Musculoskeletal: no clubbing / cyanosis. No joint deformity upper and lower extremities. Good ROM, no contractures. Normal muscle tone.  Skin: no rashes, lesions, ulcers. No induration Neurologic: CN 2-12 grossly intact. Sensation intact, DTR normal. Strength 5/5 in all 4.  Psychiatric: Normal judgment and insight. Alert and oriented x 3. Normal mood.     Labs on Admission: I have personally reviewed following labs and imaging studies  CBC: Recent Labs  Lab 12/20/19 0705  HGB 9.5*  HCT 74.2*   Basic Metabolic Panel: Recent Labs  Lab 12/20/19 0705 12/20/19 0711 12/20/19 1109  NA 138 139 137  K 6.9* 7.4* 5.7*  CL 114* 111 110  CO2  --  19* 21*  GLUCOSE 73 70 48*  BUN 37* 32* 31*  CREATININE 2.90* 2.84* 2.56*  CALCIUM  --  9.4 9.0   GFR: Estimated Creatinine Clearance: 33.6 mL/min (A) (by C-G formula based on SCr of 2.56 mg/dL (H)). Liver Function Tests: No results for input(s): AST, ALT, ALKPHOS, BILITOT, PROT, ALBUMIN in the last 168 hours. No results for input(s): LIPASE, AMYLASE in the last 168 hours. No results for input(s): AMMONIA in the last 168 hours. Coagulation Profile: No results for input(s): INR, PROTIME in the last 168 hours. Cardiac Enzymes: No results for input(s): CKTOTAL, CKMB, CKMBINDEX, TROPONINI in the last 168 hours. BNP (last 3 results) No results for input(s): PROBNP in the last 8760 hours. HbA1C: No results for  input(s): HGBA1C in the last 72 hours. CBG: Recent Labs  Lab 12/20/19 1033 12/20/19 1202 12/20/19 1304  GLUCAP 74 65* 154*   Lipid Profile: No results  for input(s): CHOL, HDL, LDLCALC, TRIG, CHOLHDL, LDLDIRECT in the last 72 hours. Thyroid Function Tests: No results for input(s): TSH, T4TOTAL, FREET4, T3FREE, THYROIDAB in the last 72 hours. Anemia Panel: No results for input(s): VITAMINB12, FOLATE, FERRITIN, TIBC, IRON, RETICCTPCT in the last 72 hours. Urine analysis:    Component Value Date/Time   COLORURINE YELLOW 09/09/2018 0910   APPEARANCEUR HAZY (A) 09/09/2018 0910   LABSPEC 1.016 09/09/2018 0910   PHURINE 5.0 09/09/2018 0910   GLUCOSEU NEGATIVE 09/09/2018 0910   HGBUR NEGATIVE 09/09/2018 0910   BILIRUBINUR NEGATIVE 09/09/2018 0910   KETONESUR 5 (A) 09/09/2018 0910   PROTEINUR 30 (A) 09/09/2018 0910   NITRITE NEGATIVE 09/09/2018 0910   LEUKOCYTESUR LARGE (A) 09/09/2018 0910   Sepsis Labs: Recent Results (from the past 240 hour(s))  SARS CORONAVIRUS 2 (TAT 6-24 HRS) Nasopharyngeal Nasopharyngeal Swab     Status: None   Collection Time: 12/16/19  9:44 AM   Specimen: Nasopharyngeal Swab  Result Value Ref Range Status   SARS Coronavirus 2 NEGATIVE NEGATIVE Final    Comment: (NOTE) SARS-CoV-2 target nucleic acids are NOT DETECTED.  The SARS-CoV-2 RNA is generally detectable in upper and lower respiratory specimens during the acute phase of infection. Negative results do not preclude SARS-CoV-2 infection, do not rule out co-infections with other pathogens, and should not be used as the sole basis for treatment or other patient management decisions. Negative results must be combined with clinical observations, patient history, and epidemiological information. The expected result is Negative.  Fact Sheet for Patients: SugarRoll.be  Fact Sheet for Healthcare Providers: https://www.woods-mathews.com/  This test is not yet  approved or cleared by the [place] FDA and  has been authorized for detection and/or diagnosis of SARS-CoV-2 by FDA under an Emergency Use Authorization (EUA). This EUA will remain  in effect (meaning this test can be used) for the duration of the COVID-19 declaration under Se ction 564(b)(1) of the Act, 21 U.S.C. section 360bbb-3(b)(1), unless the authorization is terminated or revoked sooner.  Performed at [facility], [place] [address], [place], [place] [address]      Radiological Exams on Admission: DG CHEST PORT 1 VIEW  Result Date: 12/20/2019 CLINICAL DATA:  Hyperkalemia EXAM: PORTABLE CHEST 1 VIEW COMPARISON:  09/14/2018 FINDINGS: Porta catheter on the left with tip at the SVC. There is no edema, consolidation, effusion, or pneumothorax. Normal heart size. Stable aortic tortuosity. IMPRESSION: No evidence of active disease. Electronically Signed   By: Monte Fantasia M.D.   On: 12/20/2019 11:22    EKG: Independently reviewed.  Sinus rhythm at 72 bpm with signs of T wave peaking and  first-degree AV block  Assessment/Plan  Hyperkalemia: Acute.  Initial potassium noted to be elevated up to 7.9 on admission.  Patient had been given calcium gluconate, Lokelma 10 g, dextrose, and insulin -Admit to a progressive bed -Check potassium   Hypoglycemia: Blood sugars noted to be as low as 48 on admission thought to be iatrogenic in nature after patient had been given dextrose and insulin to treat for hyperkalemia.  -Hypoglycemia protocol -Continue to monitor blood sugars every 4 hours -  History of gastric cancer: Patient with a history of gastric cancer initially diagnosed in 2017.  Status post distal gastrectomy -Continue outpatient follow-up with Dr. Alen Blew  Hypertensive urgency: Acute. On admission patient's blood pressures noted to initially be elevated into the 200s.  Home medications include hydralazine 25 mg 3 times daily, furosemide 20 mg daily, labetalol 20 mg daily,  and verapamil 80 mg 3 times daily. -Continue home medication regimen. -  Chronic kidney disease stage IV -Continue sodium bicarb  Anemia of chronic  disease: On admission hemoglobin 9.9 which appears near patient's baseline. -Continue to monitor   DVT prophylaxis: ***   Code Status: ***  Family Communication: ***  Disposition Plan: ***  Consults called: ***  Admission status: ***   Norval Morton MD [facility] Pager [phone]   If 7PM-7AM, please contact night-coverage [email or web address] Password [email or web address]  12/20/2019, 1:22 PM

## 2019-12-20 NOTE — Progress Notes (Signed)
Patient denies chest pain, shortness of breath, or palpitations. Patient reports that he has had diarrhea for 2 days with some nausea. Patient NSR on monitor without ectopy. Family updated.

## 2019-12-20 NOTE — H&P (Signed)
History and Physical    Jacob [name] GDJ:[id number] DOB: [birth year] DOA: 12/20/2019  Referring MD/NP/PA: Dr. Rolena Infante PCP: Patient, No Pcp Per  Patient coming from: PACU  Chief Complaint: Removal of Port-A-Cath.  I have personally briefly reviewed patient's old medical records in [place]   HPI: Jacob [name] is a 74 y.o. 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  Patient states that it was initially placed for him to receive chemotherapy 4 years ago.  Since that time patient has had routine surveillance.  Last  EGD was in 2019 showed a single 8 mm sessile polyp.  He most recently had a colonoscopy on 08/24/2019 which revealed 4 sessile polyps in the sigmoid colon which were removed.  He has been followed in the outpatient setting by Dr. Alen Blew of oncology.  Patient states that he was told that he no longer needed the Port-A-Cath and was set up to be removed today.  However, labs revealed patient to have potassium 7.4, BUN 32, creatinine 2.84, and glucose as low as 65.  Patient had been given calcium gluconate, insulin, dextrose, and Lokelma 10 g.  TRH was called to admit due to hyperkalemia. Patient did not have Port-A-Cath removed at this time.  Patient states that he is not on any, potassium supplements.  He follows with Dr. Joelyn Oms of nephrology  ED Course: As seen above.  Review of Systems  Constitutional: Negative for fever and weight loss.  Eyes: Negative for blurred vision and photophobia.  Cardiovascular: Positive for leg swelling. Negative for chest pain.    Past Medical History:  Diagnosis Date  . Anemia    hx iron deficiency  . Anxiety   . Arthritis    gout  . Back pain   . BPH with obstruction/lower urinary tract  symptoms   . Chronic kidney disease    "they said I do"  . Constipation   . Depression   . ED (erectile dysfunction)   . Epigastric pain   . GERD (gastroesophageal reflux disease)   . Heartburn   . History of blood transfusion   . History of radiation therapy 11/03/11-12/29/11   prostate  . Hypercholesterolemia   . Hypertension   . Night sweats   . Oxygen deficiency    2x per week at night for sleep  . Post-operative nausea and vomiting 04/09/2016  . Prostate cancer ([place]) 08/14/11   Adenocarcinoma,gleason:3+3=6,& 3+4=7,PSA=5.66  . PUD (peptic ulcer disease)   . Sleep apnea    does not use Cpap but does use O2 @ 2l   . Stomach cancer ([place])   . Ulcer    peptic ulcer hx    Past Surgical History:  Procedure Laterality Date  . colon polyps bx  11/24/06   colon,transverse and rectosigmoid polyps:tubular adenomas and hyperplastic polyps,no high grade dysplasia or malignancy   . COLONOSCOPY WITH PROPOFOL N/A 08/24/2019   Procedure: COLONOSCOPY WITH PROPOFOL;  Surgeon: [name], DO;  Location: [facility];  Service: Gastroenterology;  Laterality: N/A;  . duodenal bx  11/24/06   benign  . ESOPHAGOGASTRODUODENOSCOPY N/A 10/27/2015   Procedure: ESOPHAGOGASTRODUODENOSCOPY (EGD);  Surgeon: [name], MD;  Location: [facility];  Service: Endoscopy;  Laterality: N/A;  . EUS N/A 11/08/2015   Procedure: UPPER ENDOSCOPIC ULTRASOUND (EUS) RADIAL;  Surgeon: [name],  MD;  Location: [facility];  Service: Endoscopy;  Laterality: N/A;  . GASTRECTOMY N/A 03/25/2016   Procedure: DISTAL GASTRECTOMY;  Surgeon: [name], MD;  Location: [facility];  Service: General;  Laterality: N/A;  . gastric bx  11/24/06   chronic active gastritis,with metaplasia and focal changaes of xanthelasma  . GASTROSTOMY N/A 03/25/2016   Procedure: INSERTION OF FEEDING TUBE;  Surgeon: [name], MD;  Location: [facility];  Service: General;  Laterality: N/A;  . INSERTION PROSTATE RADIATION SEED  12-29-11   . IR GENERIC HISTORICAL  04/01/2016   IR GJ TUBE CHANGE 04/01/2016 Markus Daft, MD [facility]  . IR GENERIC HISTORICAL  04/05/2016   IR GJ TUBE CHANGE 04/05/2016 Markus Daft, MD [facility]  . LAPAROSCOPIC GASTRECTOMY  03/25/2016   Diagnostic laparoscopy, distal gastrectomy with Billroth 2 reconstruction and gastrojejunostomy tube  . LAPAROSCOPY N/A 03/25/2016   Procedure: DIAGNOSTIC LAPAROSCOPY;  Surgeon: [name], MD;  Location: [facility];  Service: General;  Laterality: N/A;  . POLYPECTOMY  08/24/2019   Procedure: POLYPECTOMY;  Surgeon: [name], DO;  Location: [facility];  Service: Gastroenterology;;  . Sol Passer PLACEMENT Left 12/04/2015   Procedure: INSERTION PORT-A-CATH;  Surgeon: [name], MD;  Location: [facility] [place];  Service: General;  Laterality: Left;  . PROSTATE BIOPSY  08/14/11   Adenocarcinoma/volume=58.51cc,gleason=3+3=6 & 3+4=7  . TONSILLECTOMY     74 years old     reports that he quit smoking about 19 years ago. His smoking use included cigarettes. He has a 45.00 pack-year smoking history. He has never used smokeless tobacco. He reports current alcohol use of about 1.0 standard drink of alcohol per week. He reports that he does not use drugs.  No Known Allergies  Family History  Problem Relation Age of Onset  . Cancer Mother        NOS  . Alcohol abuse Father   . Alcohol abuse Brother 50  . Cancer Paternal Aunt        NOS  . Colon cancer Neg Hx     Prior to Admission medications   Medication Sig Start Date End Date Taking? Authorizing Provider  acetaminophen (TYLENOL) 500 MG tablet Take 1,000 mg by mouth daily as needed for moderate pain or headache.   Yes [provider]  allopurinol (ZYLOPRIM) 300 MG tablet Take 300 mg by mouth daily. 04/15/19  Yes [provider]  buPROPion (WELLBUTRIN XL) 300 MG 24 hr tablet Take 300 mg by mouth daily.  05/10/15  Yes [provider]  calcium carbonate (TUMS - DOSED IN MG ELEMENTAL CALCIUM) 500 MG  chewable tablet Chew 3 tablets by mouth 2 (two) times daily as needed for indigestion or heartburn.   Yes [provider]  cetirizine (ZYRTEC ALLERGY) 10 MG tablet Take 10 mg by mouth daily.   Yes [provider]  diclofenac Sodium (VOLTAREN) 1 % GEL Apply 1 application topically 2 (two) times daily as needed (pain).  02/19/19  Yes [provider]  furosemide (LASIX) 20 MG tablet Take 20 mg by mouth daily.    Yes [provider]  gabapentin (NEURONTIN) 400 MG capsule Take 400 mg by mouth.   Yes [provider]  hydrALAZINE (APRESOLINE) 25 MG tablet Take 25 mg by mouth 3 (three) times daily.  08/05/18  Yes [provider]  Melatonin 10 MG TABS Take 10 mg by mouth at bedtime as needed (sleep).    Yes [provider]  Nebivolol HCl (BYSTOLIC) 20 MG TABS  Take 20 mg by mouth daily.    Yes [provider]  polycarbophil (FIBERCON) 625 MG tablet Take 1 tablet (625 mg total) by mouth daily. 04/21/16  Yes [name], MD  polyethylene glycol (MIRALAX / GLYCOLAX) 17 g packet Take 17 g by mouth 2 (two) times daily. 09/18/18  Yes Hall, Carole N, DO  prochlorperazine (COMPAZINE) 10 MG tablet TAKE ONE TABLET BY MOUTH EVERY 6 HOURS AS NEEDED FOR NAUSSEA AND VOMITING Patient taking differently: Take 10 mg by mouth every 6 (six) hours as needed for nausea or vomiting.  12/13/19  Yes Wyatt Portela, MD  sodium bicarbonate 650 MG tablet Take 650 mg by mouth 2 (two) times daily.   Yes [provider]  tamsulosin (FLOMAX) 0.4 MG CAPS capsule Take 1 capsule (0.4 mg total) by mouth daily after supper. Patient taking differently: Take 0.4 mg by mouth daily.  03/01/19  Yes McKenzie, Candee Furbish, MD  venlafaxine (EFFEXOR) 75 MG tablet Take 75 mg by mouth 2 (two) times daily.   Yes [provider]  verapamil (CALAN) 80 MG tablet Take 80 mg by mouth 3 (three) times daily.    Yes [provider]  pantoprazole (PROTONIX) 40 MG tablet Take  1 tablet (40 mg total) by mouth daily before supper. Patient not taking: Reported on 12/16/2019 09/18/18   Kayleen Memos, DO    Physical Exam:  Constitutional: Obese malein NAD, calm, comfortable Vitals:   12/20/19 1250 12/20/19 1255 12/20/19 1300 12/20/19 1305  BP:      Pulse: 81 80 81 80  Resp: 15 18 15  (!) 21  Temp:      TempSrc:      SpO2: 99% 98% 99% 100%  Weight:      Height:       Eyes: PERRL, lids and conjunctivae normal ENMT: Mucous membranes are moist. Posterior pharynx clear of any exudate or lesions.  Neck: normal, supple, no masses, no thyromegaly Respiratory: clear to auscultation bilaterally, no wheezing, no crackles. Normal respiratory effort. No accessory muscle use.  Cardiovascular: Regular rate and rhythm, no murmurs / rubs / gallops.  Trace edema on the right lower extremity . 2+ pedal pulses. No carotid bruits.  Abdomen: no tenderness, no masses palpated. No hepatosplenomegaly. Bowel sounds positive.  Musculoskeletal: no clubbing / cyanosis. No joint deformity upper and lower extremities. Good ROM, no contractures. Normal muscle tone.  Skin: no rashes, lesions, ulcers. No induration Neurologic: CN 2-12 grossly intact. Sensation intact, DTR normal. Strength 5/5 in all 4.  Psychiatric: Normal judgment and insight. Alert and oriented x 3. Normal mood.     Labs on Admission: I have personally reviewed following labs and imaging studies  CBC: Recent Labs  Lab 12/20/19 0705  HGB 9.5*  HCT 16.0*   Basic Metabolic Panel: Recent Labs  Lab 12/20/19 0705 12/20/19 0711 12/20/19 1109  NA 138 139 137  K 6.9* 7.4* 5.7*  CL 114* 111 110  CO2  --  19* 21*  GLUCOSE 73 70 48*  BUN 37* 32* 31*  CREATININE 2.90* 2.84* 2.56*  CALCIUM  --  9.4 9.0   GFR: Estimated Creatinine Clearance: 33.6 mL/min (A) (by C-G formula based on SCr of 2.56 mg/dL (H)). Liver Function Tests: No results for input(s): AST, ALT, ALKPHOS, BILITOT, PROT, ALBUMIN in the last 168 hours. No  results for input(s): LIPASE, AMYLASE in the last 168 hours. No results for input(s): AMMONIA in the last 168 hours. Coagulation Profile: No results for input(s):  INR, PROTIME in the last 168 hours. Cardiac Enzymes: No results for input(s): CKTOTAL, CKMB, CKMBINDEX, TROPONINI in the last 168 hours. BNP (last 3 results) No results for input(s): PROBNP in the last 8760 hours. HbA1C: No results for input(s): HGBA1C in the last 72 hours. CBG: Recent Labs  Lab 12/20/19 1033 12/20/19 1202 12/20/19 1304  GLUCAP 74 65* 154*   Lipid Profile: No results for input(s): CHOL, HDL, LDLCALC, TRIG, CHOLHDL, LDLDIRECT in the last 72 hours. Thyroid Function Tests: No results for input(s): TSH, T4TOTAL, FREET4, T3FREE, THYROIDAB in the last 72 hours. Anemia Panel: No results for input(s): VITAMINB12, FOLATE, FERRITIN, TIBC, IRON, RETICCTPCT in the last 72 hours. Urine analysis:    Component Value Date/Time   COLORURINE YELLOW 09/09/2018 0910   APPEARANCEUR HAZY (A) 09/09/2018 0910   LABSPEC 1.016 09/09/2018 0910   PHURINE 5.0 09/09/2018 0910   GLUCOSEU NEGATIVE 09/09/2018 0910   HGBUR NEGATIVE 09/09/2018 0910   BILIRUBINUR NEGATIVE 09/09/2018 0910   KETONESUR 5 (A) 09/09/2018 0910   PROTEINUR 30 (A) 09/09/2018 0910   NITRITE NEGATIVE 09/09/2018 0910   LEUKOCYTESUR LARGE (A) 09/09/2018 0910   Sepsis Labs: Recent Results (from the past 240 hour(s))  SARS CORONAVIRUS 2 (TAT 6-24 HRS) Nasopharyngeal Nasopharyngeal Swab     Status: None   Collection Time: 12/16/19  9:44 AM   Specimen: Nasopharyngeal Swab  Result Value Ref Range Status   SARS Coronavirus 2 NEGATIVE NEGATIVE Final    Comment: (NOTE) SARS-CoV-2 target nucleic acids are NOT DETECTED.  The SARS-CoV-2 RNA is generally detectable in upper and lower respiratory specimens during the acute phase of infection. Negative results do not preclude SARS-CoV-2 infection, do not rule out co-infections with other pathogens, and should not be  used as the sole basis for treatment or other patient management decisions. Negative results must be combined with clinical observations, patient history, and epidemiological information. The expected result is Negative.  Fact Sheet for Patients: SugarRoll.be  Fact Sheet for Healthcare Providers: https://www.woods-mathews.com/  This test is not yet approved or cleared by the [place] FDA and  has been authorized for detection and/or diagnosis of SARS-CoV-2 by FDA under an Emergency Use Authorization (EUA). This EUA will remain  in effect (meaning this test can be used) for the duration of the COVID-19 declaration under Se ction 564(b)(1) of the Act, 21 U.S.C. section 360bbb-3(b)(1), unless the authorization is terminated or revoked sooner.  Performed at [facility], [place] [address] [place], [place] [address]      Radiological Exams on Admission: DG CHEST PORT 1 VIEW  Result Date: 12/20/2019 CLINICAL DATA:  Hyperkalemia EXAM: PORTABLE CHEST 1 VIEW COMPARISON:  09/14/2018 FINDINGS: Porta catheter on the left with tip at the SVC. There is no edema, consolidation, effusion, or pneumothorax. Normal heart size. Stable aortic tortuosity. IMPRESSION: No evidence of active disease. Electronically Signed   By: Monte Fantasia M.D.   On: 12/20/2019 11:22    EKG: Independently reviewed.  Sinus rhythm at 72 bpm with signs of T wave peaking and  first-degree AV block  Assessment/Plan Hyperkalemia: Acute. Potassium noted to be elevated up to 7.9 on BMP and 6.9 by i-STAT. EKG showed signs of T wave peaking. Patient had been given calcium gluconate, Lokelma 10 g, dextrose, and insulin. Repeat check and come down to 5.7. -Admit to a progressive bed -Continue to monitor potassium levels -Give additional dose of Lokelma 10g x1 dose  Hypoglycemia due to insulin: Blood sugars noted to be as low  as 48 on admission thought to be iatrogenic in nature  after patient had been given dextrose and insulin to treat for hyperkalemia.  -Hypoglycemia protocol -Continue to monitor blood sugars every 4 hours -Amp of D50 as needed for low blood sugars  Hypertensive urgency: Acute. On admission patient's blood pressures noted to initially be elevated into the 200s.  Home medications include hydralazine 25 mg 3 times daily, furosemide 20 mg daily, labetalol 20 mg daily, and verapamil 80 mg 3 times daily. -Continue home medication regimen. -Hydralazine IV as needed for elevated blood pressures.  Chronic kidney disease stage IV metabolic acidosis: Labs significant for CO2 19, creatinine 2.84, BUN 32. He follows with Dr. Marylou Flesher of the in the outpatient setting. -Continue sodium bicarb  -Nephrology was consulted, will follow for further recommendations in a.m.  Anemia of chronic  disease: On admission hemoglobin 9.9 which appears near patient's baseline. He denies any reports of bleeding. -Continue to monitor  History of gastric cancer: Patient with a history of gastric cancer initially diagnosed in 2017.  Status post distal gastrectomy and chemotherapy. Patient had come to the hospital initially to have the Port-A-Cath removed but surgery, prior to initial labs showing elevated potassium levels. -Continue outpatient follow-up with Dr. Alen Blew -Follow-up with surgery and see if they still plan on removing Port-A-Cath at some time once medically stable  DVT prophylaxis: Heparin Code Status: Full Family Communication: None Disposition Plan: Hopefully discharge home in 1 to 2 days. Consults called: Nephrology Admission status: Observation  Norval Morton MD [facility] Pager [phone]   If 7PM-7AM, please contact night-coverage [email or web address] Password [email or web address]  12/20/2019, 1:22 PM

## 2019-12-20 NOTE — Progress Notes (Signed)
BP 199/103 upon arrival to unit. MD notified. PRN Hydralazine ordered. Instructed to give oral medications adequate time to work before giving PRN hydralazine.

## 2019-12-20 NOTE — Interval H&P Note (Signed)
History and Physical Interval Note:  12/20/2019 7:36 AM  Jacob [name]  has presented today for surgery, with the diagnosis of HISTORY OF GASTRIC CANCER.  The various methods of treatment have been discussed with the patient and family. After consideration of risks, benefits and other options for treatment, the patient has consented to  Procedure(s): PORT REMOVAL (N/A) as a surgical intervention.  The patient's history has been reviewed, patient examined, no change in status, stable for surgery.  I have reviewed the patient's chart and labs.  Questions were answered to the patient's satisfaction.     [name]

## 2019-12-21 ENCOUNTER — Other Ambulatory Visit: Payer: Medicare Other

## 2019-12-21 DIAGNOSIS — Z8546 Personal history of malignant neoplasm of prostate: Secondary | ICD-10-CM | POA: Diagnosis not present

## 2019-12-21 DIAGNOSIS — I129 Hypertensive chronic kidney disease with stage 1 through stage 4 chronic kidney disease, or unspecified chronic kidney disease: Secondary | ICD-10-CM | POA: Diagnosis present

## 2019-12-21 DIAGNOSIS — N184 Chronic kidney disease, stage 4 (severe): Secondary | ICD-10-CM | POA: Diagnosis present

## 2019-12-21 DIAGNOSIS — E875 Hyperkalemia: Secondary | ICD-10-CM | POA: Diagnosis present

## 2019-12-21 DIAGNOSIS — Z9221 Personal history of antineoplastic chemotherapy: Secondary | ICD-10-CM | POA: Diagnosis not present

## 2019-12-21 DIAGNOSIS — D631 Anemia in chronic kidney disease: Secondary | ICD-10-CM | POA: Diagnosis present

## 2019-12-21 DIAGNOSIS — Z85028 Personal history of other malignant neoplasm of stomach: Secondary | ICD-10-CM | POA: Diagnosis not present

## 2019-12-21 DIAGNOSIS — T383X5A Adverse effect of insulin and oral hypoglycemic [antidiabetic] drugs, initial encounter: Secondary | ICD-10-CM | POA: Diagnosis present

## 2019-12-21 DIAGNOSIS — E872 Acidosis: Secondary | ICD-10-CM | POA: Diagnosis present

## 2019-12-21 DIAGNOSIS — Z5309 Procedure and treatment not carried out because of other contraindication: Secondary | ICD-10-CM | POA: Diagnosis present

## 2019-12-21 DIAGNOSIS — E669 Obesity, unspecified: Secondary | ICD-10-CM | POA: Diagnosis present

## 2019-12-21 DIAGNOSIS — Z20822 Contact with and (suspected) exposure to covid-19: Secondary | ICD-10-CM | POA: Diagnosis present

## 2019-12-21 DIAGNOSIS — E160 Drug-induced hypoglycemia without coma: Secondary | ICD-10-CM | POA: Diagnosis present

## 2019-12-21 DIAGNOSIS — Z6834 Body mass index (BMI) 34.0-34.9, adult: Secondary | ICD-10-CM | POA: Diagnosis not present

## 2019-12-21 DIAGNOSIS — G4730 Sleep apnea, unspecified: Secondary | ICD-10-CM | POA: Diagnosis present

## 2019-12-21 DIAGNOSIS — I160 Hypertensive urgency: Secondary | ICD-10-CM | POA: Diagnosis present

## 2019-12-21 DIAGNOSIS — Z903 Acquired absence of stomach [part of]: Secondary | ICD-10-CM | POA: Diagnosis not present

## 2019-12-21 DIAGNOSIS — D696 Thrombocytopenia, unspecified: Secondary | ICD-10-CM | POA: Diagnosis present

## 2019-12-21 DIAGNOSIS — Z79899 Other long term (current) drug therapy: Secondary | ICD-10-CM | POA: Diagnosis not present

## 2019-12-21 DIAGNOSIS — Z923 Personal history of irradiation: Secondary | ICD-10-CM | POA: Diagnosis not present

## 2019-12-21 DIAGNOSIS — Z9079 Acquired absence of other genital organ(s): Secondary | ICD-10-CM | POA: Diagnosis not present

## 2019-12-21 DIAGNOSIS — Z87891 Personal history of nicotine dependence: Secondary | ICD-10-CM | POA: Diagnosis not present

## 2019-12-21 LAB — CBC
HCT: 26.7 % — ABNORMAL LOW (ref 39.0–52.0)
Hemoglobin: 8.9 g/dL — ABNORMAL LOW (ref 13.0–17.0)
MCH: 32.7 pg (ref 26.0–34.0)
MCHC: 33.3 g/dL (ref 30.0–36.0)
MCV: 98.2 fL (ref 80.0–100.0)
Platelets: 146 10*3/uL — ABNORMAL LOW (ref 150–400)
RBC: 2.72 MIL/uL — ABNORMAL LOW (ref 4.22–5.81)
RDW: 14.3 % (ref 11.5–15.5)
WBC: 6.3 10*3/uL (ref 4.0–10.5)
nRBC: 0 % (ref 0.0–0.2)

## 2019-12-21 LAB — BASIC METABOLIC PANEL
Anion gap: 7 (ref 5–15)
Anion gap: 8 (ref 5–15)
BUN: 30 mg/dL — ABNORMAL HIGH (ref 8–23)
BUN: 28 mg/dL — ABNORMAL HIGH (ref 8–23)
CO2: 21 mmol/L — ABNORMAL LOW (ref 22–32)
CO2: 21 mmol/L — ABNORMAL LOW (ref 22–32)
Calcium: 9.1 mg/dL (ref 8.9–10.3)
Calcium: 9.1 mg/dL (ref 8.9–10.3)
Chloride: 110 mmol/L (ref 98–111)
Chloride: 107 mmol/L (ref 98–111)
Creatinine, Ser: 2.36 mg/dL — ABNORMAL HIGH (ref 0.61–1.24)
Creatinine, Ser: 2.43 mg/dL — ABNORMAL HIGH (ref 0.61–1.24)
GFR, Estimated: 28 mL/min — ABNORMAL LOW (ref >60)
GFR, Estimated: 27 mL/min — ABNORMAL LOW (ref >60)
Glucose, Bld: 73 mg/dL (ref 70–99)
Glucose, Bld: 95 mg/dL (ref 70–99)
Potassium: 6.1 mmol/L — ABNORMAL HIGH (ref 3.5–5.1)
Potassium: 5.4 mmol/L — ABNORMAL HIGH (ref 3.5–5.1)
Sodium: 138 mmol/L (ref 135–145)
Sodium: 136 mmol/L (ref 135–145)

## 2019-12-21 LAB — FERRITIN: Ferritin: 25 ng/mL (ref 24–336)

## 2019-12-21 LAB — IRON AND TIBC
Iron: 108 ug/dL (ref 45–182)
Saturation Ratios: 32 % (ref 17.9–39.5)
TIBC: 335 ug/dL (ref 250–450)
UIBC: 227 ug/dL

## 2019-12-21 LAB — GLUCOSE, CAPILLARY
Glucose-Capillary: 58 mg/dL — ABNORMAL LOW (ref 70–99)
Glucose-Capillary: 101 mg/dL — ABNORMAL HIGH (ref 70–99)
Glucose-Capillary: 94 mg/dL (ref 70–99)
Glucose-Capillary: 97 mg/dL (ref 70–99)
Glucose-Capillary: 96 mg/dL (ref 70–99)

## 2019-12-21 MED ORDER — SODIUM CHLORIDE 0.9 % IV SOLN: INTRAVENOUS | Status: AC

## 2019-12-21 MED ORDER — SODIUM POLYSTYRENE SULFONATE 15 GM/60ML PO SUSP
30.0000 g | Freq: Once | ORAL | Status: AC
  Administered 2019-12-21: 30 g via ORAL
  Filled 2019-12-21: qty 120

## 2019-12-21 NOTE — Plan of Care (Signed)
  Problem: Education: Goal: Knowledge of General Education information will improve Description: Including pain rating scale, medication(s)/side effects and non-pharmacologic comfort measures Outcome: Progressing   Problem: Elimination: Goal: Will not experience complications related to bowel motility Outcome: Progressing   Problem: Elimination: Goal: Will not experience complications related to urinary retention Outcome: Progressing   Problem: Safety: Goal: Ability to remain free from injury will improve Outcome: Progressing

## 2019-12-21 NOTE — Consult Note (Signed)
[facility] Renal Consultation Note  Requesting MD: Fuller Plan, MD Indication for Consultation:  Hyperkalemia   Chief complaint: came for port removal   HPI:  Jacob [name] is a 74 y.o. male history CKD, hypertension, depression, gastric cancer status post distal gastrectomy and chemotherapy, MGUS, hx prostate cancer, and anemia of chronic disease who presented to the hospital for planned removal of his Port-A-Cath.  He states that he has not received chemotherapy since 4 years ago and has been told that his cancer is no longer present.  Labs demonstrated hyperkalemia and the surgery/port removal was canceled.  He received Lokelma twice yesterday as well as calcium gluconate insulin and dextrose.  He was continued on home bicarb and lasix.  In addition to the second Lokelma last night we gave an amp of bicarbonate.  He typically sees Dr. Joelyn Oms at [facility] and last saw around a month ago.  He denies being on potassium supplements.  He states that he does eat a lot of fruit but relates that this is great.  He denies any bananas oranges or tomatoes.  No melons.  He denies any nonsteroidal use.  He was ordered lactated Ringer's but never received.  He states there have not been any changes in his Lasix dosing recently.  No trouble urinating.  He feels okay today.  He denies any new medications aside from a medicine for depression from the [place] which starts with a "V"; note that this corresponds with venlafaxine which is on his med list below.  He denies any over-the-counter medicines other than Tylenol, Tylenol PM, and Imodium AD.  He has had diarrhea for the past couple of days.  He's heard of valsartan and per [facility] charting used to take but doesn't think he's taking anymore.   Last Cr 2.47 on 12/01/19 at [facility] and K 5.3, eGFR 25-29 07/2019 - Cr 2.76, K 5.1  Creatinine  Date/Time Value Ref Range Status  06/21/2019 02:12 PM 2.76 (H) 0.61 - 1.24 mg/dL Final  12/14/2018 11:02 AM  2.07 (H) 0.61 - 1.24 mg/dL Final  04/15/2018 11:58 AM 2.46 (H) 0.61 - 1.24 mg/dL Final  09/15/2017 10:51 AM 1.99 (H) 0.61 - 1.24 mg/dL Final  01/16/2017 09:24 AM 1.9 (H) 0.7 - 1.3 mg/dL Final  07/09/2016 10:13 AM 2.1 (H) 0.7 - 1.3 mg/dL Final  03/12/2016 10:02 AM 1.6 (H) 0.7 - 1.3 mg/dL Final  02/20/2016 08:53 AM 1.7 (H) 0.7 - 1.3 mg/dL Final  02/06/2016 08:24 AM 1.8 (H) 0.7 - 1.3 mg/dL Final  01/23/2016 09:05 AM 1.8 (H) 0.7 - 1.3 mg/dL Final  01/09/2016 08:17 AM 1.8 (H) 0.7 - 1.3 mg/dL Final  12/26/2015 08:53 AM 1.8 (H) 0.7 - 1.3 mg/dL Final  12/12/2015 08:17 AM 2.0 (H) 0.7 - 1.3 mg/dL Final  11/28/2015 11:43 AM 1.9 (H) 0.7 - 1.3 mg/dL Final  09/27/2015 08:34 AM 2.1 (H) 0.7 - 1.3 mg/dL Final  07/19/2015 01:27 PM 2.5 (H) 0.7 - 1.3 mg/dL Final  06/07/2015 01:14 PM 1.9 (H) 0.7 - 1.3 mg/dL Final  04/13/2015 11:28 AM 2.9 (H) 0.7 - 1.3 mg/dL Final   Creatinine, Ser  Date/Time Value Ref Range Status  12/21/2019 03:41 AM 2.36 (H) 0.61 - 1.24 mg/dL Final  12/20/2019 04:43 PM 2.57 (H) 0.61 - 1.24 mg/dL Final  12/20/2019 11:09 AM 2.56 (H) 0.61 - 1.24 mg/dL Final  12/20/2019 07:11 AM 2.84 (H) 0.61 - 1.24 mg/dL Final  12/20/2019 07:05 AM 2.90 (H) 0.61 - 1.24 mg/dL Final  09/19/2018 06:43  AM 2.46 (H) 0.61 - 1.24 mg/dL Final  09/18/2018 05:04 AM 2.23 (H) 0.61 - 1.24 mg/dL Final  09/17/2018 04:55 AM 2.49 (H) 0.61 - 1.24 mg/dL Final  09/16/2018 05:20 AM 2.61 (H) 0.61 - 1.24 mg/dL Final  09/15/2018 05:00 AM 2.52 (H) 0.61 - 1.24 mg/dL Final  09/14/2018 04:31 AM 2.53 (H) 0.61 - 1.24 mg/dL Final  09/13/2018 06:41 AM 2.89 (H) 0.61 - 1.24 mg/dL Final  09/12/2018 10:27 AM 3.11 (H) 0.61 - 1.24 mg/dL Final  09/12/2018 07:14 AM 3.22 (H) 0.61 - 1.24 mg/dL Final  09/11/2018 05:30 AM 3.19 (H) 0.61 - 1.24 mg/dL Final  09/10/2018 07:37 AM 3.39 (H) 0.61 - 1.24 mg/dL Final  09/09/2018 08:05 AM 4.35 (H) 0.61 - 1.24 mg/dL Final  05/20/2017 09:42 AM 2.02 (H) 0.70 - 1.30 mg/dL Final  04/20/2016 04:22 AM 1.59 (H)  0.61 - 1.24 mg/dL Final  04/19/2016 04:51 AM 1.63 (H) 0.61 - 1.24 mg/dL Final  04/18/2016 05:44 AM 1.73 (H) 0.61 - 1.24 mg/dL Final  04/17/2016 08:34 AM 2.05 (H) 0.61 - 1.24 mg/dL Final  04/16/2016 08:20 AM 1.82 (H) 0.61 - 1.24 mg/dL Final  04/16/2016 04:53 AM 1.83 (H) 0.61 - 1.24 mg/dL Final  04/15/2016 04:00 AM 1.59 (H) 0.61 - 1.24 mg/dL Final  04/14/2016 04:49 AM 1.60 (H) 0.61 - 1.24 mg/dL Final  04/13/2016 05:00 AM 1.49 (H) 0.61 - 1.24 mg/dL Final  04/11/2016 05:05 AM 1.65 (H) 0.61 - 1.24 mg/dL Final  04/10/2016 08:42 AM 2.03 (H) 0.61 - 1.24 mg/dL Final  04/09/2016 03:48 AM 2.48 (H) 0.61 - 1.24 mg/dL Final  04/08/2016 10:19 PM 2.69 (H) 0.61 - 1.24 mg/dL Final  04/06/2016 04:34 AM 2.12 (H) 0.61 - 1.24 mg/dL Final  04/04/2016 04:42 AM 1.98 (H) 0.61 - 1.24 mg/dL Final  04/01/2016 04:07 AM 1.69 (H) 0.61 - 1.24 mg/dL Final  03/30/2016 04:08 AM 1.92 (H) 0.61 - 1.24 mg/dL Final  03/29/2016 05:03 AM 2.10 (H) 0.61 - 1.24 mg/dL Final  03/28/2016 05:28 AM 2.00 (H) 0.61 - 1.24 mg/dL Final  03/27/2016 04:17 AM 1.88 (H) 0.61 - 1.24 mg/dL Final  03/26/2016 08:08 AM 1.94 (H) 0.61 - 1.24 mg/dL Final  03/25/2016 03:52 PM 1.77 (H) 0.61 - 1.24 mg/dL Final  03/19/2016 01:01 PM 1.71 (H) 0.61 - 1.24 mg/dL Final  12/04/2015 06:46 AM 1.79 (H) 0.61 - 1.24 mg/dL Final  11/27/2015 02:16 PM 2.09 (H) 0.61 - 1.24 mg/dL Final  11/27/2015 08:25 AM 2.15 (H) 0.61 - 1.24 mg/dL Final  11/26/2015 01:30 PM 2.03 (H) 0.61 - 1.24 mg/dL Final  10/29/2015 04:57 AM 1.92 (H) 0.61 - 1.24 mg/dL Final  10/26/2015 03:51 PM 1.86 (H) 0.61 - 1.24 mg/dL Final  11/08/2012 04:49 PM 1.60 (H) 0.50 - 1.35 mg/dL Final     PMHx:   Past Medical History:  Diagnosis Date  . Anemia    hx iron deficiency  . Anxiety   . Arthritis    gout  . Back pain   . BPH with obstruction/lower urinary tract symptoms   . Chronic kidney disease    "they said I do"  . Constipation   . Depression   . ED (erectile dysfunction)   . Epigastric pain    . GERD (gastroesophageal reflux disease)   . Heartburn   . History of blood transfusion   . History of radiation therapy 11/03/11-12/29/11   prostate  . Hypercholesterolemia   . Hypertension   . Night sweats   . Oxygen deficiency    2x per  week at night for sleep  . Post-operative nausea and vomiting 04/09/2016  . Prostate cancer ([place]) 08/14/11   Adenocarcinoma,gleason:3+3=6,& 3+4=7,PSA=5.66  . PUD (peptic ulcer disease)   . Sleep apnea    does not use Cpap but does use O2 @ 2l   . Stomach cancer ([place])   . Ulcer    peptic ulcer hx    Past Surgical History:  Procedure Laterality Date  . colon polyps bx  11/24/06   colon,transverse and rectosigmoid polyps:tubular adenomas and hyperplastic polyps,no high grade dysplasia or malignancy   . COLONOSCOPY WITH PROPOFOL N/A 08/24/2019   Procedure: COLONOSCOPY WITH PROPOFOL;  Surgeon: [name], DO;  Location: [facility];  Service: Gastroenterology;  Laterality: N/A;  . duodenal bx  11/24/06   benign  . ESOPHAGOGASTRODUODENOSCOPY N/A 10/27/2015   Procedure: ESOPHAGOGASTRODUODENOSCOPY (EGD);  Surgeon: [name], MD;  Location: [facility];  Service: Endoscopy;  Laterality: N/A;  . EUS N/A 11/08/2015   Procedure: UPPER ENDOSCOPIC ULTRASOUND (EUS) RADIAL;  Surgeon: [name], MD;  Location: [facility];  Service: Endoscopy;  Laterality: N/A;  . GASTRECTOMY N/A 03/25/2016   Procedure: DISTAL GASTRECTOMY;  Surgeon: [name], MD;  Location: [facility];  Service: General;  Laterality: N/A;  . gastric bx  11/24/06   chronic active gastritis,with metaplasia and focal changaes of xanthelasma  . GASTROSTOMY N/A 03/25/2016   Procedure: INSERTION OF FEEDING TUBE;  Surgeon: [name], MD;  Location: [facility];  Service: General;  Laterality: N/A;  . INSERTION PROSTATE RADIATION SEED  12-29-11  . IR GENERIC HISTORICAL  04/01/2016   IR GJ TUBE CHANGE 04/01/2016 Markus Daft, MD [facility]  . IR GENERIC HISTORICAL  04/05/2016   IR GJ  TUBE CHANGE 04/05/2016 Markus Daft, MD [facility]  . LAPAROSCOPIC GASTRECTOMY  03/25/2016   Diagnostic laparoscopy, distal gastrectomy with Billroth 2 reconstruction and gastrojejunostomy tube  . LAPAROSCOPY N/A 03/25/2016   Procedure: DIAGNOSTIC LAPAROSCOPY;  Surgeon: [name], MD;  Location: [facility];  Service: General;  Laterality: N/A;  . POLYPECTOMY  08/24/2019   Procedure: POLYPECTOMY;  Surgeon: [name], DO;  Location: [facility];  Service: Gastroenterology;;  . Sol Passer PLACEMENT Left 12/04/2015   Procedure: INSERTION PORT-A-CATH;  Surgeon: [name], MD;  Location: [facility];  Service: General;  Laterality: Left;  . PROSTATE BIOPSY  08/14/11   Adenocarcinoma/volume=58.51cc,gleason=3+3=6 & 3+4=7  . TONSILLECTOMY     74 years old    Family Hx:  Family History  Problem Relation Age of Onset  . Cancer Mother        NOS  . Alcohol abuse Father   . Alcohol abuse Brother 50  . Cancer Paternal Aunt        NOS  . Colon cancer Neg Hx     Social History:  reports that he quit smoking about 19 years ago. His smoking use included cigarettes. He has a 45.00 pack-year smoking history. He has never used smokeless tobacco. He reports current alcohol use of about 1.0 standard drink of alcohol per week. He reports that he does not use drugs.  Allergies: No Known Allergies  Medications: Prior to Admission medications   Medication Sig Start Date End Date Taking? Authorizing Provider  acetaminophen (TYLENOL) 500 MG tablet Take 1,000 mg by mouth daily as needed for moderate pain or headache.   Yes [provider]  allopurinol (ZYLOPRIM) 300 MG tablet Take 300 mg by mouth daily. 04/15/19  Yes [provider]  buPROPion (WELLBUTRIN XL) 300  MG 24 hr tablet Take 300 mg by mouth daily.  05/10/15  Yes [provider]  calcium carbonate (TUMS - DOSED IN MG ELEMENTAL CALCIUM) 500 MG chewable tablet Chew 3 tablets by mouth 2 (two) times daily as needed for indigestion  or heartburn.   Yes [provider]  cetirizine (ZYRTEC ALLERGY) 10 MG tablet Take 10 mg by mouth daily.   Yes [provider]  diclofenac Sodium (VOLTAREN) 1 % GEL Apply 1 application topically 2 (two) times daily as needed (pain).  02/19/19  Yes [provider]  furosemide (LASIX) 20 MG tablet Take 20 mg by mouth daily.    Yes [provider]  gabapentin (NEURONTIN) 400 MG capsule Take 400 mg by mouth.   Yes [provider]  hydrALAZINE (APRESOLINE) 25 MG tablet Take 25 mg by mouth 3 (three) times daily.  08/05/18  Yes [provider]  Melatonin 10 MG TABS Take 10 mg by mouth at bedtime as needed (sleep).    Yes [provider]  Nebivolol HCl (BYSTOLIC) 20 MG TABS Take 20 mg by mouth daily.    Yes [provider]  polycarbophil (FIBERCON) 625 MG tablet Take 1 tablet (625 mg total) by mouth daily. 04/21/16  Yes [name], MD  polyethylene glycol (MIRALAX / GLYCOLAX) 17 g packet Take 17 g by mouth 2 (two) times daily. 09/18/18  Yes Hall, Carole N, DO  prochlorperazine (COMPAZINE) 10 MG tablet TAKE ONE TABLET BY MOUTH EVERY 6 HOURS AS NEEDED FOR NAUSSEA AND VOMITING Patient taking differently: Take 10 mg by mouth every 6 (six) hours as needed for nausea or vomiting.  12/13/19  Yes Wyatt Portela, MD  sodium bicarbonate 650 MG tablet Take 650 mg by mouth 2 (two) times daily.   Yes [provider]  tamsulosin (FLOMAX) 0.4 MG CAPS capsule Take 1 capsule (0.4 mg total) by mouth daily after supper. Patient taking differently: Take 0.4 mg by mouth daily.  03/01/19  Yes McKenzie, Candee Furbish, MD  venlafaxine (EFFEXOR) 75 MG tablet Take 75 mg by mouth 2 (two) times daily.   Yes [provider]  verapamil (CALAN) 80 MG tablet Take 80 mg by mouth 3 (three) times daily.    Yes [provider]  pantoprazole (PROTONIX) 40 MG tablet Take 1 tablet (40 mg total) by mouth daily before supper. Patient not taking: Reported on  12/16/2019 09/18/18   Kayleen Memos, DO    I have reviewed the patient's current and reported prior to admission medications.  Labs:  BMP Latest Ref Rng & Units 12/21/2019 12/20/2019 12/20/2019  Glucose 70 - 99 mg/dL 73 75 48(L)  BUN 8 - 23 mg/dL 30(H) 29(H) 31(H)  Creatinine 0.61 - 1.24 mg/dL 2.36(H) 2.57(H) 2.56(H)  Sodium 135 - 145 mmol/L 138 138 137  Potassium 3.5 - 5.1 mmol/L 6.1(H) 5.6(H) 5.7(H)  Chloride 98 - 111 mmol/L 110 109 110  CO2 22 - 32 mmol/L 21(L) 21(L) 21(L)  Calcium 8.9 - 10.3 mg/dL 9.1 9.4 9.0    Urinalysis    Component Value Date/Time   COLORURINE YELLOW 09/09/2018 0910   APPEARANCEUR HAZY (A) 09/09/2018 0910   LABSPEC 1.016 09/09/2018 0910   PHURINE 5.0 09/09/2018 0910   GLUCOSEU NEGATIVE 09/09/2018 0910   HGBUR NEGATIVE 09/09/2018 0910   BILIRUBINUR NEGATIVE 09/09/2018 0910   KETONESUR 5 (A) 09/09/2018 0910   PROTEINUR 30 (A) 09/09/2018 0910   NITRITE NEGATIVE 09/09/2018 0910   LEUKOCYTESUR LARGE (A) 09/09/2018 0910  ROS:  Pertinent items noted in HPI and remainder of comprehensive ROS otherwise negative.  Physical Exam: Vitals:   12/21/19 0010 12/21/19 0443  BP: (!) 159/89 (!) 162/93  Pulse: 66 76  Resp: 13 16  Temp: 98.9 F (37.2 C) 98.8 F (37.1 C)  SpO2: 98% 98%     General: adult male in bed in NAD HEENT: Normocephalic atraumatic Eyes: Extraocular movements intact sclera anicteric Neck: Supple trachea midline Heart: Regular rate and rhythm no murmur or rub appreciated Lungs: Clear to auscultation bilaterally normal work of breathing at rest on room air Abdomen: Soft nontender nondistended red bowel sounds Extremities: No lower extremity edema appreciated no cyanosis or clubbing Skin: No rash on extremities exposed Neuro: Alert and oriented x3 provides a history and follows commands  Psych normal mood and affect  Assessment/Plan:  # Hyperkalemia - Kayexalate once now - Renal diet is ordered for here - would reinforce renal diet  at home - Trial of normal saline at 75 ml/hr x 12 hours  - Continue home lasix (euvolemic on exam and goal of K removal) - Continue oral bicarbonate - he may benefit from scheduled weekly or twice a week lokelma (vs. More frequent dosing, based on trends) - Repeat BMP for noon ordered and renal panel in AM  # CKD stage IV - Appears to be near his baseline of mid 2's - Follows with Dr. Joelyn Oms  # HTN  - Missed some of his meds yesterday and got nebivolol late afternoon - would follow on home regimen today  # Metabolic acidosis - Continue oral bicarbonate   # Anemia of chronic disease - Hb chronically runs 9's per [facility] labs - check iron panel   Claudia Desanctis 12/21/2019, 7:22 AM

## 2019-12-21 NOTE — Progress Notes (Signed)
PROGRESS NOTE    Jacob [name]  GQQ:[id number] DOB: 1945/11/22 DOA: 12/20/2019 PCP: Patient, No Pcp Per   Brief Narrative:  Patient is 74 year old male with past medical history of gastric cancer status post distal gastrectomy and chemo therapy, anemia of chronic disease, CKD stage IV, prostate cancer status post radiation therapy in 2013, MUGA's in active surveillance presented initially for removal of his Port-A-Cath which was placed to receive chemo 4 years ago.  Patient was told that he no longer needed a Port-A-Cath and was set up to be removed however labs reveal patient to have potassium of 7.4, BUN 32, creatinine: 2.84 and glucose was as low as 65.  Patient was given calcium gluconate, insulin, dextrose and Lokelma 10 g and admitted for further evaluation and management of hyperkalemia.  Nephrology was consulted.  Assessment & Plan:   Hyperkalemia: -EKG on admission showed peaked T waves.  Patient was given calcium gluconate, Lokelma 10 g, dextrose and insulin in ED. -Potassium trended down from 7.4-6.1. -Continue on telemetry. -Seen by nephrology-appreciate input.  Kayexalate once now.  Trial of normal saline at 75 mL for 12 hours.  Continue home Lasix.  Continue oral bicarb. -Repeat BMP tomorrow a.m.  Hypoglycemia: -Likely iatrogenic.  Blood sugar noted to be as low as 48 on admission thought to be secondary to  insulin which had been given to treat hyperkalemia. -Continue to monitor blood sugar every 4 hour.  Blood glucose this morning: 101.  Hypertensive urgency: -Blood pressure is improving.  Continue home medications of hydralazine, Lasix, labetalol and verapamil. -Continue hydralazine IV as needed for blood pressure more than 160/100.  CKD stage IV/metabolic acidosis: -Continue sodium bicarb. -Avoid nephrotoxic medications. -Appreciate nephrology help.  Monitor kidney function closely  Anemia of chronic disease: -In the setting of CKD stage IV.  H&H is currently  stable.  No indication of antibiotics at this time -Iron panel-within normal limits.  Thrombocytopenia: Platelet: 146.  No signs of active bleeding.  Continue to monitor.  History of gastric cancer: -Diagnosed in 2017.  Status post distal gastrectomy and chemotherapy. -Followed by oncology Dr. Alen Blew outpatient. -Patient has Port-A-Cath that was supposed to be removed-prior to the admission. -We will discussed this with general surgery.  History of prostate cancer status post prostatectomy and radiation therapy: Patient denies any urinary symptoms.  DVT prophylaxis: Heparin  code Status: Full code Family Communication:  None present at bedside.  Plan of care discussed with patient in length and he verbalized understanding and agreed with it. Disposition Plan: Likely home in 1 to 2 days  Consultants:   Nephrology  Procedures:   None  Antimicrobials:  None Status is: Observation  Dispo: The patient is from: Home              Anticipated d/c is to: Home              Anticipated d/c date is: 1 day              Patient currently is not medically stable to d/c.   Subjective: Patient seen and examined.  Sitting comfortably on the bed.  Tells me that he feels better and denies any complaints such as chest pain, palpitation, shortness of breath, leg swelling, orthopnea, PND, nausea, vomiting or epigastric pain.  Objective: Vitals:   12/21/19 0010 12/21/19 0443 12/21/19 0452 12/21/19 0802  BP: (!) 159/89 (!) 162/93  (!) 163/98  Pulse: 66 76  74  Resp: 13 16  20   Temp: 98.9 F (  37.2 C) 98.8 F (37.1 C)  98.7 F (37.1 C)  TempSrc: Oral Oral  Oral  SpO2: 98% 98%  97%  Weight:   120.9 kg   Height:       No intake or output data in the 24 hours ending 12/21/19 1057 Filed Weights   12/20/19 0624 12/21/19 0452  Weight: 111.1 kg 120.9 kg    Examination:  General exam: Appears calm and comfortable, on room air, elderly, communicating well Respiratory system: Clear to  auscultation. Respiratory effort normal. Cardiovascular system: S1 & S2 heard, RRR. No JVD, murmurs, rubs, gallops or clicks. No pedal edema. Gastrointestinal system: Abdomen is nondistended, soft and nontender. No organomegaly or masses felt. Normal bowel sounds heard. Central nervous system: Alert and oriented. No focal neurological deficits. Extremities: Symmetric 5 x 5 power. Skin: No rashes, lesions or ulcers Psychiatry: Judgement and insight appear normal. Mood & affect appropriate.    Data Reviewed: I have personally reviewed following labs and imaging studies  CBC: Recent Labs  Lab 12/20/19 0705 12/21/19 0341  WBC  --  6.3  HGB 9.5* 8.9*  HCT 28.0* 26.7*  MCV  --  98.2  PLT  --  222*   Basic Metabolic Panel: Recent Labs  Lab 12/20/19 0705 12/20/19 0711 12/20/19 1109 12/20/19 1643 12/21/19 0341  NA 138 139 137 138 138  K 6.9* 7.4* 5.7* 5.6* 6.1*  CL 114* 111 110 109 110  CO2  --  19* 21* 21* 21*  GLUCOSE 73 70 48* 75 73  BUN 37* 32* 31* 29* 30*  CREATININE 2.90* 2.84* 2.56* 2.57* 2.36*  CALCIUM  --  9.4 9.0 9.4 9.1   GFR: Estimated Creatinine Clearance: 37.9 mL/min (A) (by C-G formula based on SCr of 2.36 mg/dL (H)). Liver Function Tests: No results for input(s): AST, ALT, ALKPHOS, BILITOT, PROT, ALBUMIN in the last 168 hours. No results for input(s): LIPASE, AMYLASE in the last 168 hours. No results for input(s): AMMONIA in the last 168 hours. Coagulation Profile: No results for input(s): INR, PROTIME in the last 168 hours. Cardiac Enzymes: No results for input(s): CKTOTAL, CKMB, CKMBINDEX, TROPONINI in the last 168 hours. BNP (last 3 results) No results for input(s): PROBNP in the last 8760 hours. HbA1C: No results for input(s): HGBA1C in the last 72 hours. CBG: Recent Labs  Lab 12/20/19 1710 12/20/19 1750 12/20/19 2025 12/21/19 0808 12/21/19 0853  GLUCAP 60* 128* 84 58* 101*   Lipid Profile: No results for input(s): CHOL, HDL, LDLCALC, TRIG,  CHOLHDL, LDLDIRECT in the last 72 hours. Thyroid Function Tests: No results for input(s): TSH, T4TOTAL, FREET4, T3FREE, THYROIDAB in the last 72 hours. Anemia Panel: Recent Labs    12/21/19 0706  FERRITIN 25  TIBC 335  IRON 108   Sepsis Labs: No results for input(s): PROCALCITON, LATICACIDVEN in the last 168 hours.  Recent Results (from the past 240 hour(s))  SARS CORONAVIRUS 2 (TAT 6-24 HRS) Nasopharyngeal Nasopharyngeal Swab     Status: None   Collection Time: 12/16/19  9:44 AM   Specimen: Nasopharyngeal Swab  Result Value Ref Range Status   SARS Coronavirus 2 NEGATIVE NEGATIVE Final    Comment: (NOTE) SARS-CoV-2 target nucleic acids are NOT DETECTED.  The SARS-CoV-2 RNA is generally detectable in upper and lower respiratory specimens during the acute phase of infection. Negative results do not preclude SARS-CoV-2 infection, do not rule out co-infections with other pathogens, and should not be used as the sole basis for treatment or other patient management  decisions. Negative results must be combined with clinical observations, patient history, and epidemiological information. The expected result is Negative.  Fact Sheet for Patients: SugarRoll.be  Fact Sheet for Healthcare Providers: https://www.woods-mathews.com/  This test is not yet approved or cleared by the [place] FDA and  has been authorized for detection and/or diagnosis of SARS-CoV-2 by FDA under an Emergency Use Authorization (EUA). This EUA will remain  in effect (meaning this test can be used) for the duration of the COVID-19 declaration under Se ction 564(b)(1) of the Act, 21 U.S.C. section 360bbb-3(b)(1), unless the authorization is terminated or revoked sooner.  Performed at [facility], [place] [address] [place], [place] [address]       Radiology Studies: DG CHEST PORT 1 VIEW  Result Date: 12/20/2019 CLINICAL DATA:  Hyperkalemia EXAM:  PORTABLE CHEST 1 VIEW COMPARISON:  09/14/2018 FINDINGS: Porta catheter on the left with tip at the SVC. There is no edema, consolidation, effusion, or pneumothorax. Normal heart size. Stable aortic tortuosity. IMPRESSION: No evidence of active disease. Electronically Signed   By: Monte Fantasia M.D.   On: 12/20/2019 11:22    Scheduled Meds: . allopurinol  300 mg Oral Daily  . buPROPion  300 mg Oral Daily  . furosemide  20 mg Oral Daily  . gabapentin  400 mg Oral QHS  . heparin  5,000 Units Subcutaneous Q8H  . hydrALAZINE  25 mg Oral TID  . nebivolol  20 mg Oral Daily  . sodium bicarbonate  650 mg Oral BID  . sodium chloride flush  3 mL Intravenous Q12H  . tamsulosin  0.4 mg Oral Daily  . venlafaxine  75 mg Oral BID  . verapamil  80 mg Oral TID   Continuous Infusions: . sodium chloride 75 mL/hr at 12/21/19 0905     LOS: 0 days   Time spent: 35 minutes  Harlynn Kimbell Loann Quill, MD [facility]  If 7PM-7AM, please contact night-coverage [email or web address] 12/21/2019, 10:57 AM

## 2019-12-21 NOTE — Plan of Care (Signed)
  Problem: Clinical Measurements: Goal: Respiratory complications will improve Outcome: Progressing   Problem: Clinical Measurements: Goal: Cardiovascular complication will be avoided Outcome: Progressing   Problem: Activity: Goal: Risk for activity intolerance will decrease Outcome: Progressing   Problem: Nutrition: Goal: Adequate nutrition will be maintained Outcome: Progressing   Problem: Safety: Goal: Ability to remain free from injury will improve Outcome: Progressing

## 2019-12-22 LAB — CBC
HCT: 26.8 % — ABNORMAL LOW (ref 39.0–52.0)
Hemoglobin: 8.7 g/dL — ABNORMAL LOW (ref 13.0–17.0)
MCH: 32.3 pg (ref 26.0–34.0)
MCHC: 32.5 g/dL (ref 30.0–36.0)
MCV: 99.6 fL (ref 80.0–100.0)
Platelets: 151 10*3/uL (ref 150–400)
RBC: 2.69 MIL/uL — ABNORMAL LOW (ref 4.22–5.81)
RDW: 14.1 % (ref 11.5–15.5)
WBC: 6.4 10*3/uL (ref 4.0–10.5)
nRBC: 0 % (ref 0.0–0.2)

## 2019-12-22 LAB — RENAL FUNCTION PANEL
Albumin: 2.8 g/dL — ABNORMAL LOW (ref 3.5–5.0)
Anion gap: 7 (ref 5–15)
BUN: 31 mg/dL — ABNORMAL HIGH (ref 8–23)
CO2: 22 mmol/L (ref 22–32)
Calcium: 9.2 mg/dL (ref 8.9–10.3)
Chloride: 109 mmol/L (ref 98–111)
Creatinine, Ser: 2.67 mg/dL — ABNORMAL HIGH (ref 0.61–1.24)
GFR, Estimated: 24 mL/min — ABNORMAL LOW (ref >60)
Glucose, Bld: 97 mg/dL (ref 70–99)
Phosphorus: 3.6 mg/dL (ref 2.5–4.6)
Potassium: 5.5 mmol/L — ABNORMAL HIGH (ref 3.5–5.1)
Sodium: 138 mmol/L (ref 135–145)

## 2019-12-22 LAB — GLUCOSE, CAPILLARY
Glucose-Capillary: 68 mg/dL — ABNORMAL LOW (ref 70–99)
Glucose-Capillary: 113 mg/dL — ABNORMAL HIGH (ref 70–99)
Glucose-Capillary: 66 mg/dL — ABNORMAL LOW (ref 70–99)
Glucose-Capillary: 88 mg/dL (ref 70–99)
Glucose-Capillary: 91 mg/dL (ref 70–99)

## 2019-12-22 LAB — MAGNESIUM: Magnesium: 1.6 mg/dL — ABNORMAL LOW (ref 1.7–2.4)

## 2019-12-22 MED ORDER — SODIUM POLYSTYRENE SULFONATE 15 GM/60ML PO SUSP
15.0000 g | Freq: Once | ORAL | Status: AC
  Administered 2019-12-22: 15 g via ORAL
  Filled 2019-12-22: qty 60

## 2019-12-22 MED ORDER — MAGNESIUM OXIDE 400 (241.3 MG) MG PO TABS
400.0000 mg | ORAL_TABLET | Freq: Once | ORAL | Status: AC
  Administered 2019-12-22: 400 mg via ORAL
  Filled 2019-12-22: qty 1

## 2019-12-22 MED ORDER — SODIUM ZIRCONIUM CYCLOSILICATE 10 G PO PACK
10.0000 g | PACK | Freq: Once | ORAL | Status: AC
  Administered 2019-12-22: 10 g via ORAL
  Filled 2019-12-22: qty 1

## 2019-12-22 MED ORDER — MAGNESIUM SULFATE 2 GM/50ML IV SOLN
2.0000 g | Freq: Once | INTRAVENOUS | Status: DC
  Filled 2019-12-22: qty 50

## 2019-12-22 MED ORDER — LOKELMA 5 G PO PACK: 5.0000 g | PACK | ORAL | 0 refills | Status: DC

## 2019-12-22 NOTE — Progress Notes (Addendum)
Jacob [name] Progress Note   This is a 74 year old male with a history of CKD, hypertension, depression, gastric cancer status post distal gastrectomy and chemotherapy, MGUS, history of prostate cancer, anemia of chronic disease who presented for planned removal of Port-A-Cath.  Noted to have hyperkalemia.  Assessment/ Plan:   1. Hyperkalemia: 1. On admission EKG showed peaked T waves, received calcium gluconate, lokelma, dextrose and insulin in the EF with improvement in the K to 6.1. Received kayexalate yesterday and NS 75 x 12 hours. K down to 5.4 then 5.5 this morning.  2. Repeat kayexalate and lokelma today.  3. Ensure renal diet at home; he need education and a list of foods that are high in potassium. I let him know a few of the main culprits. 4. Continue home lasix and oral bicarbonate. 5. Would d/c on  Lokelma 5 gm twice weekly. 2. CKDIV: Cr around BL of 2. 2.67 today. Continue to follow outpatient.   - Appt with Dr. Joelyn Oms January 10th 1115AM [name]  3. HTN: BP elevated while admitted. Currently on lasix 20, hydralazine 25 TID, nebivolol 20 mg dialy, and verapamil 80 mg TID.  4. Metabolic acidosis: CO2 22 on this mornings labs. Continue oral bicarb.  5. Anemia of chronic disease: Hgb 8.7 today. Iron panel showed iron 108, TIBC 335, sat 32, and ferritin 25.   Subjective:   Patient reports that she is feeling well today, denies any new complaints. Denies chest pain, SOB, palpitations, new weakness, nausea, vomiting, or other symptoms.    Objective:   BP (!) 170/96 (BP Location: Left Arm)   Pulse 73   Temp 97.8 F (36.6 C) (Oral)   Resp 19   Ht 6\' 2"  (1.88 m)   Wt 120.9 kg   SpO2 97%   BMI 34.22 kg/m  No intake or output data in the 24 hours ending 12/22/19 1038 Weight change:   Physical Exam: General: Elderly male, NAD, sitting up in bed Cardiac: RRR, no m/r/g Pulmonary: CTABL, no wheezing, rhonchi, rales Abdomen: Soft, non-tender,  non-distended Extremity: no LE edema  Imaging: DG CHEST PORT 1 VIEW  Result Date: 12/20/2019 CLINICAL DATA:  Hyperkalemia EXAM: PORTABLE CHEST 1 VIEW COMPARISON:  09/14/2018 FINDINGS: Porta catheter on the left with tip at the SVC. There is no edema, consolidation, effusion, or pneumothorax. Normal heart size. Stable aortic tortuosity. IMPRESSION: No evidence of active disease. Electronically Signed   By: Monte Fantasia M.D.   On: 12/20/2019 11:22    Labs: BMET Recent Labs  Lab 12/20/19 0705 12/20/19 0711 12/20/19 1109 12/20/19 1643 12/21/19 0341 12/21/19 1206 12/22/19 0253  NA 138 139 137 138 138 136 138  K 6.9* 7.4* 5.7* 5.6* 6.1* 5.4* 5.5*  CL 114* 111 110 109 110 107 109  CO2  --  19* 21* 21* 21* 21* 22  GLUCOSE 73 70 48* 75 73 95 97  BUN 37* 32* 31* 29* 30* 28* 31*  CREATININE 2.90* 2.84* 2.56* 2.57* 2.36* 2.43* 2.67*  CALCIUM  --  9.4 9.0 9.4 9.1 9.1 9.2  PHOS  --   --   --   --   --   --  3.6   CBC Recent Labs  Lab 12/20/19 0705 12/21/19 0341 12/22/19 0253  WBC  --  6.3 6.4  HGB 9.5* 8.9* 8.7*  HCT 28.0* 26.7* 26.8*  MCV  --  98.2 99.6  PLT  --  146* 151    Medications:    . allopurinol  300 mg Oral Daily  . buPROPion  300 mg Oral Daily  . furosemide  20 mg Oral Daily  . gabapentin  400 mg Oral QHS  . heparin  5,000 Units Subcutaneous Q8H  . hydrALAZINE  25 mg Oral TID  . nebivolol  20 mg Oral Daily  . sodium bicarbonate  650 mg Oral BID  . sodium chloride flush  3 mL Intravenous Q12H  . tamsulosin  0.4 mg Oral Daily  . venlafaxine  75 mg Oral BID  . verapamil  80 mg Oral TID     Asencion Noble, M.D. PGY3 12/22/2019 10:38 AM  I have seen and examined this patient and agree with the plan of care.   Dwana Melena, MD 12/22/2019, 11:56 AM

## 2019-12-22 NOTE — Progress Notes (Signed)
CSW met with pt regarding readmission risk factors.  PCP not listed in epic, pt sees Dr Nilay Patel at [facility] on [address].  Discharge appt scheduled. Greg , MSW, LCSW 12/9/20219:48 AM 

## 2019-12-22 NOTE — Discharge Summary (Signed)
Physician Discharge Summary  Jacob [name] ZOX:[id number] DOB: 1945/11/24 DOA: 12/20/2019  PCP: Jacob [name], No Pcp Per  Admit date: 12/20/2019 Discharge date: 12/22/2019  Admitted From: Home Disposition: Home  Recommendations for Outpatient Follow-up:  1. Follow-up with PCP in 1 week 2. Repeat CBC, BMP and magnesium level on follow-up visit 3. Take Lokelma 5 g twice in a week 4. Follow-up with nephrology as a scheduled 5. Advised renal diet/low potassium diet 6. Reschedule appointment with general surgery for Port-A-Cath removal  Home Health: None Equipment/Devices: None Discharge Condition: Stable CODE STATUS: Full code Diet recommendation: Renal diet/low potassium diet  Brief/Interim Summary: Jacob [name] is 74 year old male with past medical history of gastric cancer status post distal gastrectomy and chemo therapy, anemia of chronic disease, CKD stage IV, prostate cancer status post radiation therapy in 2013, MUGA's in active surveillance presented initially for removal of his Port-A-Cath which was placed to receive chemo 4 years ago.  Jacob [name] was told that he no longer needed a Port-A-Cath and was set up to be removed however labs reveal Jacob [name] to have potassium of 7.4, BUN 32, creatinine: 2.84 and glucose was as low as 65.  Jacob [name] was given calcium gluconate, insulin, dextrose and Lokelma 10 g and admitted for further evaluation and management of hyperkalemia.  Nephrology was consulted.  Hyperkalemia: -EKG on admission showed peaked T waves.  Jacob [name] was given calcium gluconate, Lokelma 10 g, dextrose and insulin in ED. -Potassium trended down from 7.4-6.1 to 5.5. -Continue to monitor on telemetry -Seen by nephrology-appreciate input.  Kayexalate x1 given.  Trial of normal saline at 75 mL for 12 hours.  Continued home Lasix.  Continued oral bicarb. -Repeat Kayexalate and Lokelma today. -Nephrology recommended Lokelma 5 g twice in a week, renal diet/low potassium diet and follow-up  outpatient. -Discussed with nephrology via secure chat Dr. Tanna Savoy to discharge from nephrology standpoint.  Hypoglycemia: -Likely iatrogenic.  Blood sugar noted to be as low as 48 on admission thought to be secondary to  insulin which he received to treat hyperkalemia. -Continued to monitor blood sugar every 4 hour.    Improved.  Hypertensive urgency: -Blood pressure improved on home medications hydralazine, Lasix, labetalol and verapamil.  BP: 134/89 -Discussed about compliance.  CKD stage IV/metabolic acidosis: -Continued sodium bicarb.  Baseline creatinine: 2.  Creatinine 2.67 today. -Follow-up with nephrology outpatient as per recommendation.  Anemia of chronic disease: -In the setting of CKD stage IV.  H&H remained stable.  Hemoglobin 8.7 today.  Thrombocytopenia:  Platelet count improved from 146 to 151.  Hypomagnesemia: Replenished.  Repeat magnesium level on follow-up visit.  History of gastric cancer: -Diagnosed in 2017.  Status post distal gastrectomy and chemotherapy. -Followed by oncology Dr. Alen Blew outpatient. -Jacob [name] has Port-A-Cath that was supposed to be removed-prior to the admission. -Discussed with general surgery-recommended to reschedule appointment for Port-A-Cath removal.  History of prostate cancer status post prostatectomy and radiation therapy: Jacob [name] denied any urinary symptoms  Discharge Diagnoses:  Hyperkalemia Hypoglycemia Hypertensive urgency CKD stage IV Anion gap metabolic acidosis Anemia of chronic disease Thrombocytopenia Hypomagnesemia   Discharge Instructions  Discharge Instructions    Discharge instructions   Complete by: As directed    Follow-up with PCP in 1 week Repeat CBC, BMP and magnesium level on follow-up visit Follow-up with nephrology as a scheduled Renal diet/low potassium diet recommended.   Increase activity slowly   Complete by: As directed      Allergies as of 12/22/2019   No Known Allergies  Medication List    TAKE these medications   acetaminophen 500 MG tablet Commonly known as: TYLENOL Take 1,000 mg by mouth daily as needed for moderate pain or headache.   allopurinol 300 MG tablet Commonly known as: ZYLOPRIM Take 300 mg by mouth daily.   buPROPion 300 MG 24 hr tablet Commonly known as: WELLBUTRIN XL Take 300 mg by mouth daily.   Bystolic 20 MG Tabs Generic drug: Nebivolol HCl Take 20 mg by mouth daily.   calcium carbonate 500 MG chewable tablet Commonly known as: TUMS - dosed in mg elemental calcium Chew 3 tablets by mouth 2 (two) times daily as needed for indigestion or heartburn.   diclofenac Sodium 1 % Gel Commonly known as: VOLTAREN Apply 1 application topically 2 (two) times daily as needed (pain).   furosemide 20 MG tablet Commonly known as: LASIX Take 20 mg by mouth daily.   gabapentin 400 MG capsule Commonly known as: NEURONTIN Take 400 mg by mouth.   hydrALAZINE 25 MG tablet Commonly known as: APRESOLINE Take 25 mg by mouth 3 (three) times daily.   Lokelma 5 g packet Generic drug: sodium zirconium cyclosilicate Take 5 g by mouth 2 (two) times a week.   Melatonin 10 MG Tabs Take 10 mg by mouth at bedtime as needed (sleep).   pantoprazole 40 MG tablet Commonly known as: PROTONIX Take 1 tablet (40 mg total) by mouth daily before supper.   polycarbophil 625 MG tablet Commonly known as: FIBERCON Take 1 tablet (625 mg total) by mouth daily.   polyethylene glycol 17 g packet Commonly known as: MIRALAX / GLYCOLAX Take 17 g by mouth 2 (two) times daily.   prochlorperazine 10 MG tablet Commonly known as: COMPAZINE TAKE ONE TABLET BY MOUTH EVERY 6 HOURS AS NEEDED FOR NAUSSEA AND VOMITING What changed: See the new instructions.   sodium bicarbonate 650 MG tablet Take 650 mg by mouth 2 (two) times daily.   tamsulosin 0.4 MG Caps capsule Commonly known as: FLOMAX Take 1 capsule (0.4 mg total) by mouth daily after supper. What changed:  when to take this   venlafaxine 75 MG tablet Commonly known as: EFFEXOR Take 75 mg by mouth 2 (two) times daily.   verapamil 80 MG tablet Commonly known as: CALAN Take 80 mg by mouth 3 (three) times daily.   ZyrTEC Allergy 10 MG tablet Generic drug: cetirizine Take 10 mg by mouth daily.       Follow-up Information    Andree Moro, DO. Go on 01/05/2020.   Specialty: General Practice Why: Please attend your hospital discharge appointment with Dr Posey Pronto on Thursday, 01/05/20, at 8:20am. Contact information: [place] [address] [phone]              No Known Allergies  Consultations:  Nephrology   Procedures/Studies: DG CHEST PORT 1 VIEW  Result Date: 12/20/2019 CLINICAL DATA:  Hyperkalemia EXAM: PORTABLE CHEST 1 VIEW COMPARISON:  09/14/2018 FINDINGS: Porta catheter on the left with tip at the SVC. There is no edema, consolidation, effusion, or pneumothorax. Normal heart size. Stable aortic tortuosity. IMPRESSION: No evidence of active disease. Electronically Signed   By: Monte Fantasia M.D.   On: 12/20/2019 11:22       Subjective: Jacob [name] seen and examined.  Tells me that he feels good and has no new complaints and wishes to go home.  Discharge Exam: Vitals:   12/22/19 1038 12/22/19 1100  BP: (!) 160/103 134/89  Pulse:  64  Resp:  Temp:  98 F (36.7 C)  SpO2:  99%   Vitals:   12/21/19 2300 12/22/19 0500 12/22/19 1038 12/22/19 1100  BP: (!) 145/72 (!) 170/96 (!) 160/103 134/89  Pulse: 60 73  64  Resp: 17 19    Temp: 98.1 F (36.7 C) 97.8 F (36.6 C)  98 F (36.7 C)  TempSrc: Oral Oral  Oral  SpO2: 99% 97%  99%  Weight:      Height:        General: Pt is alert, awake, not in acute distress Cardiovascular: RRR, S1/S2 +, no rubs, no gallops Respiratory: CTA bilaterally, no wheezing, no rhonchi Abdominal: Soft, NT, ND, bowel sounds + Extremities: no edema, no cyanosis    The results of significant diagnostics from this  hospitalization (including imaging, microbiology, ancillary and laboratory) are listed below for reference.     Microbiology: Recent Results (from the past 240 hour(s))  SARS CORONAVIRUS 2 (TAT 6-24 HRS) Nasopharyngeal Nasopharyngeal Swab     Status: None   Collection Time: 12/16/19  9:44 AM   Specimen: Nasopharyngeal Swab  Result Value Ref Range Status   SARS Coronavirus 2 NEGATIVE NEGATIVE Final    Comment: (NOTE) SARS-CoV-2 target nucleic acids are NOT DETECTED.  The SARS-CoV-2 RNA is generally detectable in upper and lower respiratory specimens during the acute phase of infection. Negative results do not preclude SARS-CoV-2 infection, do not rule out co-infections with other pathogens, and should not be used as the sole basis for treatment or other Jacob [name] management decisions. Negative results must be combined with clinical observations, Jacob [name] history, and epidemiological information. The expected result is Negative.  Fact Sheet for Patients: SugarRoll.be  Fact Sheet for Healthcare Providers: https://www.woods-mathews.com/  This test is not yet approved or cleared by the [place] FDA and  has been authorized for detection and/or diagnosis of SARS-CoV-2 by FDA under an Emergency Use Authorization (EUA). This EUA will remain  in effect (meaning this test can be used) for the duration of the COVID-19 declaration under Se ction 564(b)(1) of the Act, 21 U.S.C. section 360bbb-3(b)(1), unless the authorization is terminated or revoked sooner.  Performed at [facility], [place] [address], [place], [place] [address]      Labs: BNP (last 3 results) No results for input(s): BNP in the last 8760 hours. Basic Metabolic Panel: Recent Labs  Lab 12/20/19 1109 12/20/19 1643 12/21/19 0341 12/21/19 1206 12/22/19 0253  NA 137 138 138 136 138  K 5.7* 5.6* 6.1* 5.4* 5.5*  CL 110 109 110 107 109  CO2 21* 21* 21* 21* 22  GLUCOSE  48* 75 73 95 97  BUN 31* 29* 30* 28* 31*  CREATININE 2.56* 2.57* 2.36* 2.43* 2.67*  CALCIUM 9.0 9.4 9.1 9.1 9.2  MG  --   --   --   --  1.6*  PHOS  --   --   --   --  3.6   Liver Function Tests: Recent Labs  Lab 12/22/19 0253  ALBUMIN 2.8*   No results for input(s): LIPASE, AMYLASE in the last 168 hours. No results for input(s): AMMONIA in the last 168 hours. CBC: Recent Labs  Lab 12/20/19 0705 12/21/19 0341 12/22/19 0253  WBC  --  6.3 6.4  HGB 9.5* 8.9* 8.7*  HCT 28.0* 26.7* 26.8*  MCV  --  98.2 99.6  PLT  --  146* 151   Cardiac Enzymes: No results for input(s): CKTOTAL, CKMB, CKMBINDEX, TROPONINI in the last 168 hours. BNP:  Invalid input(s): POCBNP CBG: Recent Labs  Lab 12/21/19 2019 12/22/19 0038 12/22/19 0539 12/22/19 0619 12/22/19 0805  GLUCAP 96 68* 66* 113* 88   D-Dimer No results for input(s): DDIMER in the last 72 hours. Hgb A1c No results for input(s): HGBA1C in the last 72 hours. Lipid Profile No results for input(s): CHOL, HDL, LDLCALC, TRIG, CHOLHDL, LDLDIRECT in the last 72 hours. Thyroid function studies No results for input(s): TSH, T4TOTAL, T3FREE, THYROIDAB in the last 72 hours.  Invalid input(s): FREET3 Anemia work up Recent Labs    12/21/19 0706  FERRITIN 25  TIBC 335  IRON 108   Urinalysis    Component Value Date/Time   COLORURINE YELLOW 09/09/2018 0910   APPEARANCEUR HAZY (A) 09/09/2018 0910   LABSPEC 1.016 09/09/2018 0910   PHURINE 5.0 09/09/2018 0910   GLUCOSEU NEGATIVE 09/09/2018 0910   HGBUR NEGATIVE 09/09/2018 0910   BILIRUBINUR NEGATIVE 09/09/2018 0910   KETONESUR 5 (A) 09/09/2018 0910   PROTEINUR 30 (A) 09/09/2018 0910   NITRITE NEGATIVE 09/09/2018 0910   LEUKOCYTESUR LARGE (A) 09/09/2018 0910   Sepsis Labs Invalid input(s): PROCALCITONIN,  WBC,  LACTICIDVEN Microbiology Recent Results (from the past 240 hour(s))  SARS CORONAVIRUS 2 (TAT 6-24 HRS) Nasopharyngeal Nasopharyngeal Swab     Status: None    Collection Time: 12/16/19  9:44 AM   Specimen: Nasopharyngeal Swab  Result Value Ref Range Status   SARS Coronavirus 2 NEGATIVE NEGATIVE Final    Comment: (NOTE) SARS-CoV-2 target nucleic acids are NOT DETECTED.  The SARS-CoV-2 RNA is generally detectable in upper and lower respiratory specimens during the acute phase of infection. Negative results do not preclude SARS-CoV-2 infection, do not rule out co-infections with other pathogens, and should not be used as the sole basis for treatment or other Jacob [name] management decisions. Negative results must be combined with clinical observations, Jacob [name] history, and epidemiological information. The expected result is Negative.  Fact Sheet for Patients: SugarRoll.be  Fact Sheet for Healthcare Providers: https://www.woods-mathews.com/  This test is not yet approved or cleared by the [place] FDA and  has been authorized for detection and/or diagnosis of SARS-CoV-2 by FDA under an Emergency Use Authorization (EUA). This EUA will remain  in effect (meaning this test can be used) for the duration of the COVID-19 declaration under Se ction 564(b)(1) of the Act, 21 U.S.C. section 360bbb-3(b)(1), unless the authorization is terminated or revoked sooner.  Performed at [facility], [place] [address], [place], [place] [address]      Time coordinating discharge: Over 30 minutes  SIGNED:   Mckinley Jewel, MD  [facility] 12/22/2019, 12:12 PM Pager   If 7PM-7AM, please contact night-coverage [email or web address]

## 2019-12-23 ENCOUNTER — Inpatient Hospital Stay: Payer: Medicare Other | Attending: Oncology

## 2019-12-23 ENCOUNTER — Other Ambulatory Visit: Payer: Self-pay

## 2019-12-23 ENCOUNTER — Encounter (HOSPITAL_COMMUNITY): Payer: Self-pay

## 2019-12-23 ENCOUNTER — Ambulatory Visit (HOSPITAL_COMMUNITY)
Admission: RE | Admit: 2019-12-23 | Discharge: 2019-12-23 | Disposition: A | Discharge status: Home / Self Care | Payer: Medicare Other | Source: Ambulatory Visit | Attending: Oncology | Admitting: Oncology

## 2019-12-23 ENCOUNTER — Inpatient Hospital Stay: Payer: Medicare Other

## 2019-12-23 DIAGNOSIS — C169 Malignant neoplasm of stomach, unspecified: Secondary | ICD-10-CM

## 2019-12-23 DIAGNOSIS — Z923 Personal history of irradiation: Secondary | ICD-10-CM | POA: Insufficient documentation

## 2019-12-23 DIAGNOSIS — I700 Atherosclerosis of aorta: Secondary | ICD-10-CM | POA: Diagnosis not present

## 2019-12-23 DIAGNOSIS — N189 Chronic kidney disease, unspecified: Secondary | ICD-10-CM

## 2019-12-23 DIAGNOSIS — D472 Monoclonal gammopathy: Secondary | ICD-10-CM

## 2019-12-23 DIAGNOSIS — Z79899 Other long term (current) drug therapy: Secondary | ICD-10-CM | POA: Diagnosis not present

## 2019-12-23 DIAGNOSIS — D631 Anemia in chronic kidney disease: Secondary | ICD-10-CM

## 2019-12-23 DIAGNOSIS — Z8546 Personal history of malignant neoplasm of prostate: Secondary | ICD-10-CM | POA: Insufficient documentation

## 2019-12-23 DIAGNOSIS — Z95828 Presence of other vascular implants and grafts: Secondary | ICD-10-CM

## 2019-12-23 DIAGNOSIS — E875 Hyperkalemia: Secondary | ICD-10-CM | POA: Diagnosis not present

## 2019-12-23 LAB — CBC WITH DIFFERENTIAL (CANCER CENTER ONLY)
Abs Immature Granulocytes: 0.01 10*3/uL (ref 0.00–0.07)
Basophils Absolute: 0 10*3/uL (ref 0.0–0.1)
Basophils Relative: 1 %
Eosinophils Absolute: 0.4 10*3/uL (ref 0.0–0.5)
Eosinophils Relative: 7 %
HCT: 26.9 % — ABNORMAL LOW (ref 39.0–52.0)
Hemoglobin: 8.8 g/dL — ABNORMAL LOW (ref 13.0–17.0)
Immature Granulocytes: 0 %
Lymphocytes Relative: 22 %
Lymphs Abs: 1.1 10*3/uL (ref 0.7–4.0)
MCH: 32.1 pg (ref 26.0–34.0)
MCHC: 32.7 g/dL (ref 30.0–36.0)
MCV: 98.2 fL (ref 80.0–100.0)
Monocytes Absolute: 0.5 10*3/uL (ref 0.1–1.0)
Monocytes Relative: 10 %
Neutro Abs: 3 10*3/uL (ref 1.7–7.7)
Neutrophils Relative %: 60 %
Platelet Count: 159 10*3/uL (ref 150–400)
RBC: 2.74 MIL/uL — ABNORMAL LOW (ref 4.22–5.81)
RDW: 13.9 % (ref 11.5–15.5)
WBC Count: 5.1 10*3/uL (ref 4.0–10.5)
nRBC: 0 % (ref 0.0–0.2)

## 2019-12-23 LAB — CMP (CANCER CENTER ONLY)
ALT: 10 U/L (ref 0–44)
AST: 19 U/L (ref 15–41)
Albumin: 3 g/dL — ABNORMAL LOW (ref 3.5–5.0)
Alkaline Phosphatase: 55 U/L (ref 38–126)
Anion gap: 8 (ref 5–15)
BUN: 27 mg/dL — ABNORMAL HIGH (ref 8–23)
CO2: 20 mmol/L — ABNORMAL LOW (ref 22–32)
Calcium: 9.2 mg/dL (ref 8.9–10.3)
Chloride: 110 mmol/L (ref 98–111)
Creatinine: 2.62 mg/dL — ABNORMAL HIGH (ref 0.61–1.24)
GFR, Estimated: 25 mL/min — ABNORMAL LOW (ref >60)
Glucose, Bld: 86 mg/dL (ref 70–99)
Potassium: 5.1 mmol/L (ref 3.5–5.1)
Sodium: 138 mmol/L (ref 135–145)
Total Bilirubin: 0.5 mg/dL (ref 0.3–1.2)
Total Protein: 6.6 g/dL (ref 6.5–8.1)

## 2019-12-23 IMAGING — CT CT CHEST W/O CM
2 of 4 series · 13 of 36 positions shown, 16 images · non-contrast
Comparison: CT chest abdomen pelvis [DATE].

CLINICAL DATA: Gastrointestinal cancer surveillance status post
therapy, no new symptoms.

EXAM:
CT CHEST, ABDOMEN AND PELVIS WITHOUT CONTRAST
TECHNIQUE: Multidetector CT imaging of the chest, abdomen and pelvis was
performed following the standard protocol without IV contrast.

[Series 2: cap w/o · axial · non-contrast · 0.98mm/px · z∈[-605,-40]mm · 10 of 137 slices shown, 13 images]
[im 12/137  mediastinal]
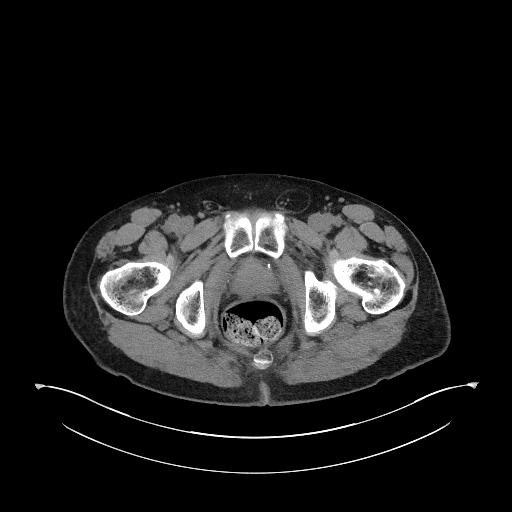
[im 12/137  lung]
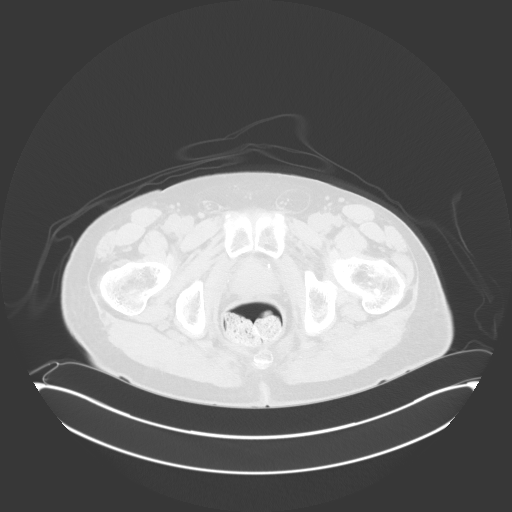
[im 23/137  lung]
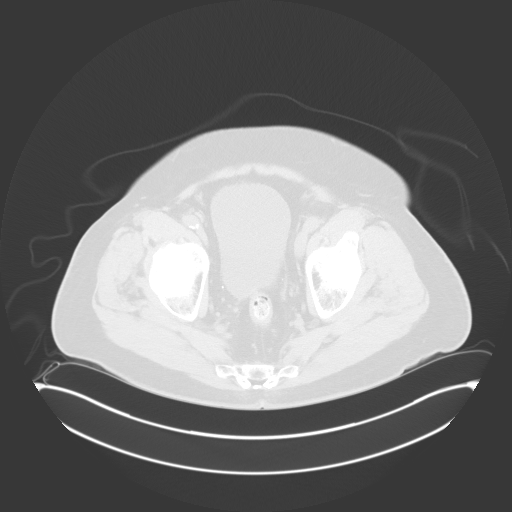
[im 35/137  lung]
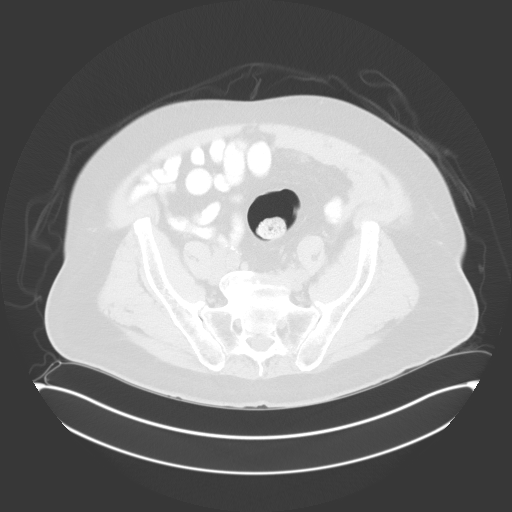
[im 46/137  lung]
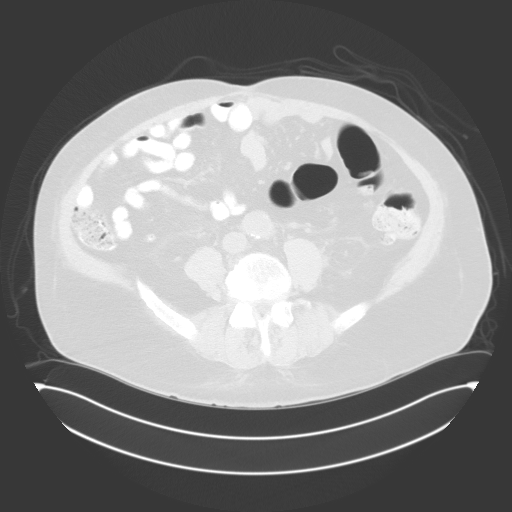
[im 57/137  mediastinal]
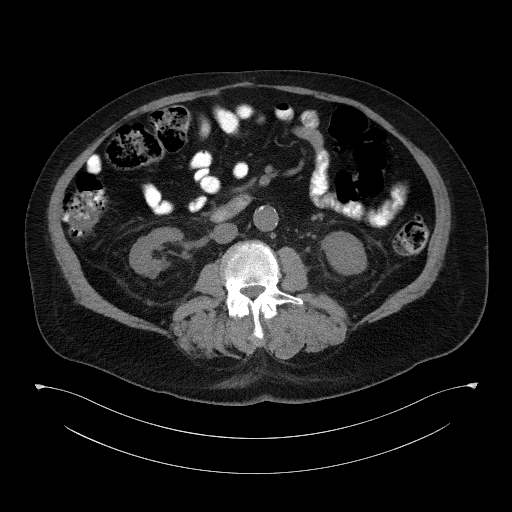
[im 57/137  lung]
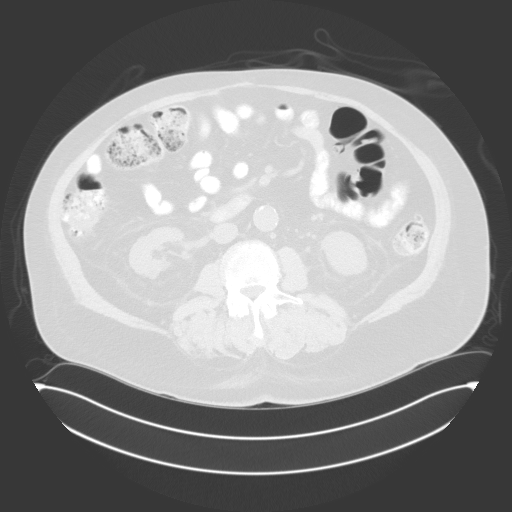
[im 80/137  lung]
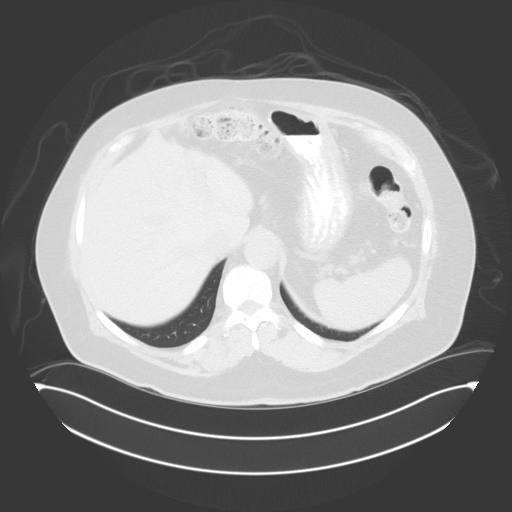
[im 91/137  lung]
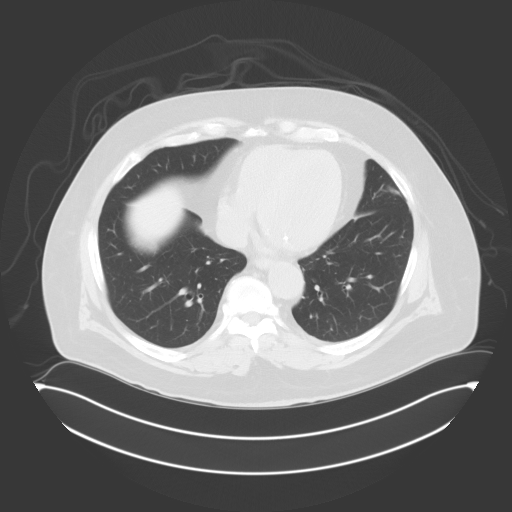
[im 103/137  lung]
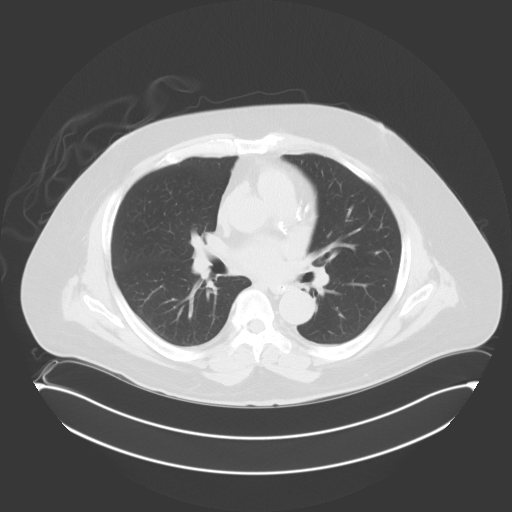
[im 114/137  mediastinal]
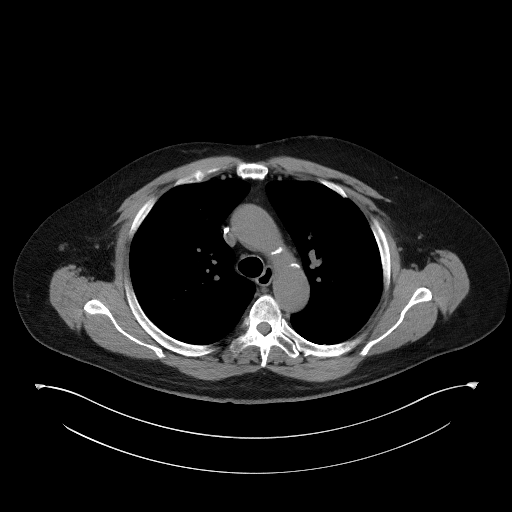
[im 114/137  lung]
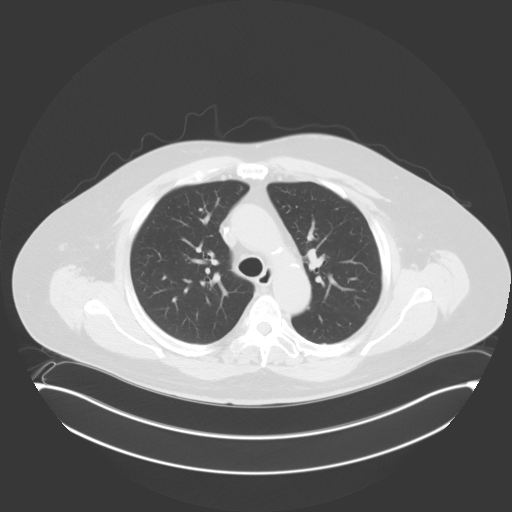
[im 125/137  lung]
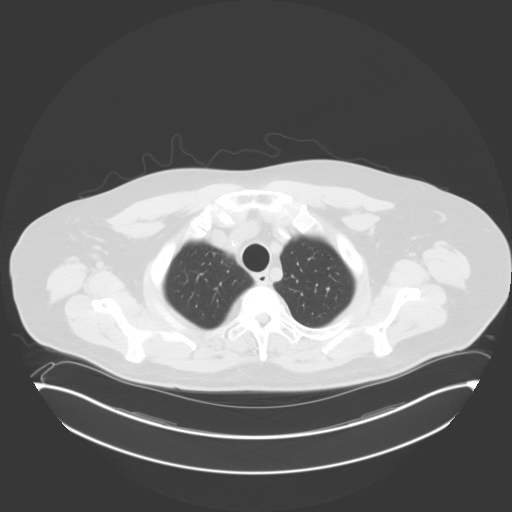

[Series 4: coronals · coronal · 0.88mm/px · 3 of 156 slices shown]
[im 32/156  lung]
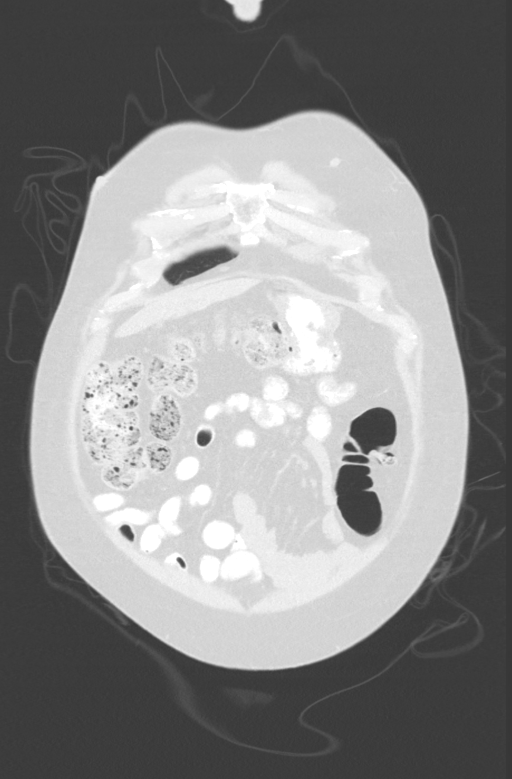
[im 63/156  lung]
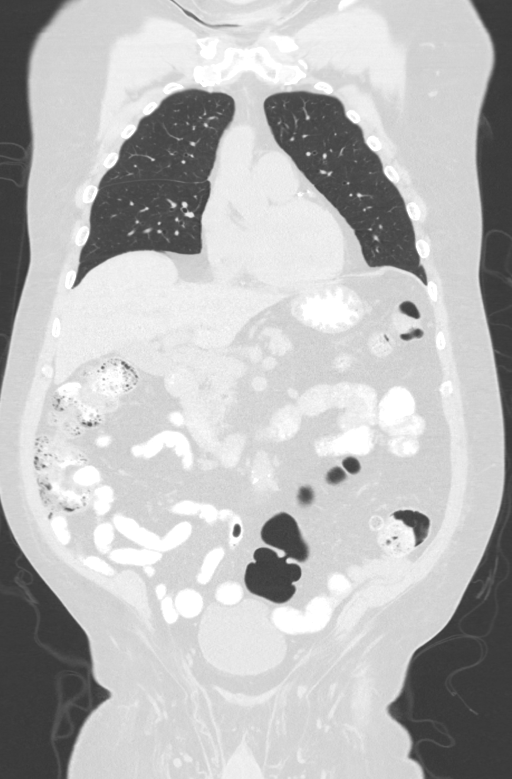
[im 94/156  lung]
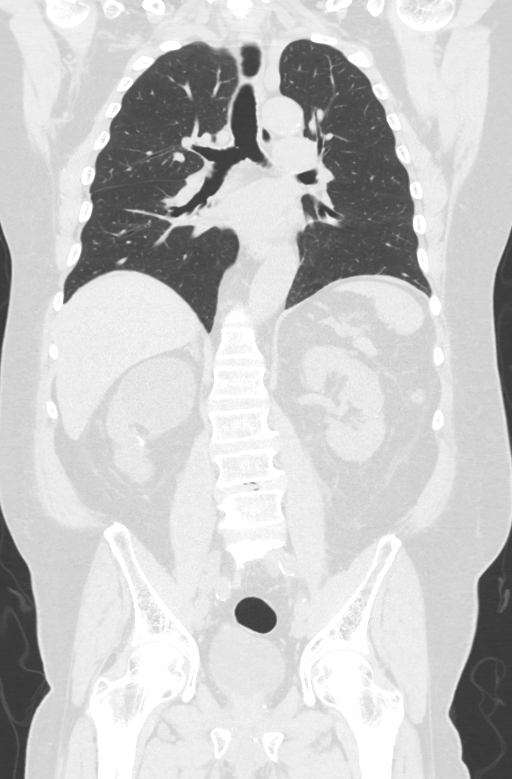

[13 of 36 positions shown; findings below may reference images not displayed]

FINDINGS: CT CHEST FINDINGS

Cardiovascular: Normal size heart. No pericardial effusion.
Three-vessel coronary artery disease. Left chest wall port with tip
in the superior vena cava. Atherosclerotic calcifications of the
aorta. Calcifications mitral annulus.

Mediastinum/Nodes: No enlarged mediastinal, hilar, or axillary lymph
nodes. Thyroid gland, trachea, and esophagus demonstrate no
significant findings.

Lungs/Pleura: Stable 3 mm right upper lobe pulmonary nodule (series
6, image 47). No new suspicious pulmonary nodules or masses. No
pleural effusion or pneumothorax.

Musculoskeletal: No chest wall mass or suspicious bone lesions
identified.

CT ABDOMEN PELVIS FINDINGS

Hepatobiliary: No focal liver abnormality is seen. No gallstones,
gallbladder wall thickening, or biliary dilatation.

Pancreas: Unremarkable. No pancreatic ductal dilatation or
surrounding inflammatory changes.

Spleen: Normal in size without focal abnormality.

Adrenals/Urinary Tract: Adrenal glands are unremarkable. No
hydronephrosis. Unchanged size of the 7 cm right upper pole renal
cyst. Unchanged size of the 1.4 cm rim calcified hypodense right
interpolar renal lesion which is incompletely characterize without
IV contrast.

Stomach/Bowel: Stable changes of distal gastrectomy and
gastrojejunostomy. The gastric lumen is partially decompressed
limiting evaluation however there is no new/suspicious wall
thickening. Ingested radiopaque contrast extends throughout the
small bowel without evidence of bowel obstruction or suspicious wall
thickening. Normal appendix. Scattered colonic diverticulosis. Small
volume of formed stool throughout the colon. No suspicious colonic
wall thickening.

Vascular/Lymphatic: Aortic atherosclerosis. No enlarged abdominal or
pelvic lymph nodes.

Reproductive: Fiducial markers noted within the prostate gland.

Other: Near complete resolution of the fluid collection along the
undersurface of the ventral abdominal wall. No ascites. Postsurgical
change in the anterior abdominal wall. Subcutaneous anterior
abdominal wall nodularity, likely sequela of injections.

Musculoskeletal: Multilevel degenerative change of the spine,
similar prior. No suspicious lytic or blastic lesions of bone. No
acute osseous abnormality.
IMPRESSION: 1. No evidence of recurrent or metastatic disease within the chest,
abdomen, or pelvis.
2. Unchanged size of the 1.4 cm rim calcified hypodense right
interpolar renal lesion which is incompletely characterize without
IV contrast.
3. Aortic atherosclerosis.

Aortic Atherosclerosis ([YW]-[YW]).

## 2019-12-23 IMAGING — CT CT ABD-PELV W/O CM
2 of 4 series · 13 of 36 positions shown, 16 images · non-contrast
Comparison: CT chest abdomen pelvis [DATE].

CLINICAL DATA: Gastrointestinal cancer surveillance status post
therapy, no new symptoms.

EXAM:
CT CHEST, ABDOMEN AND PELVIS WITHOUT CONTRAST
TECHNIQUE: Multidetector CT imaging of the chest, abdomen and pelvis was
performed following the standard protocol without IV contrast.

[Series 2: cap w/o · axial · non-contrast · 0.98mm/px · z∈[-605,-40]mm · 10 of 137 slices shown, 13 images]
[im 12/137  mediastinal]
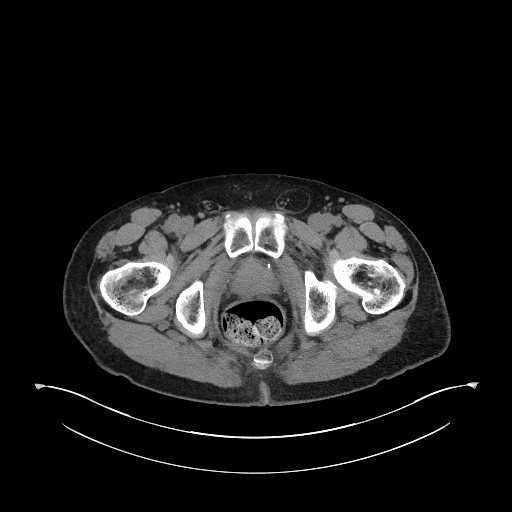
[im 12/137  lung]
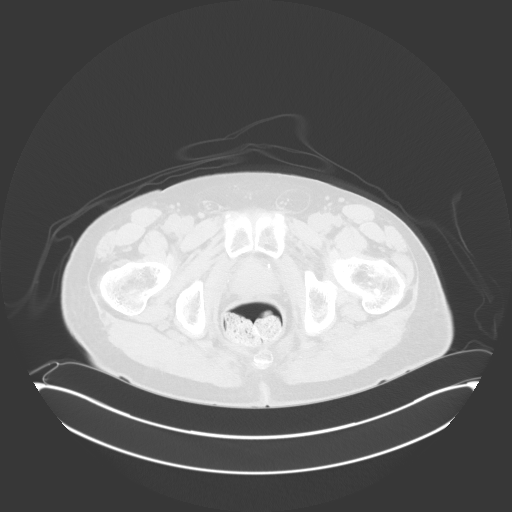
[im 23/137  lung]
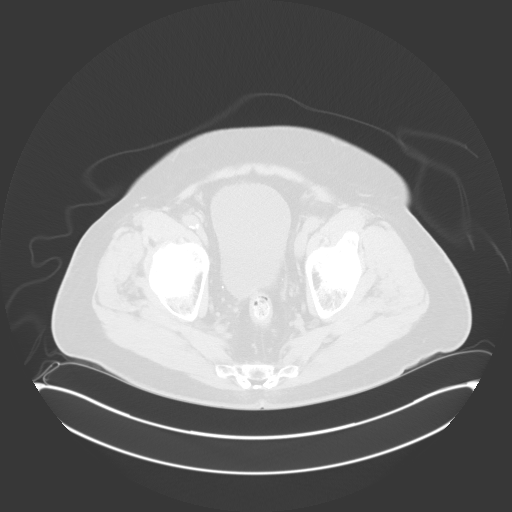
[im 35/137  lung]
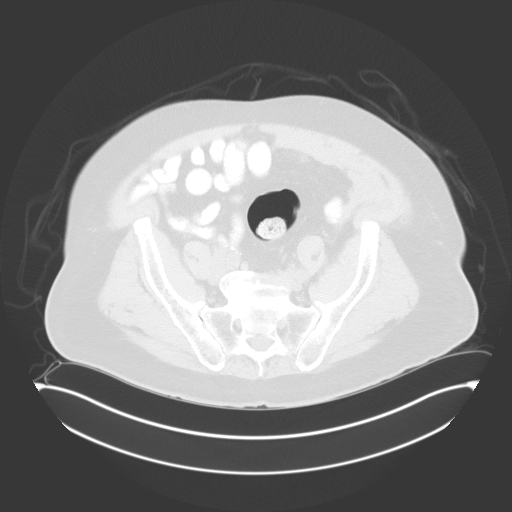
[im 46/137  lung]
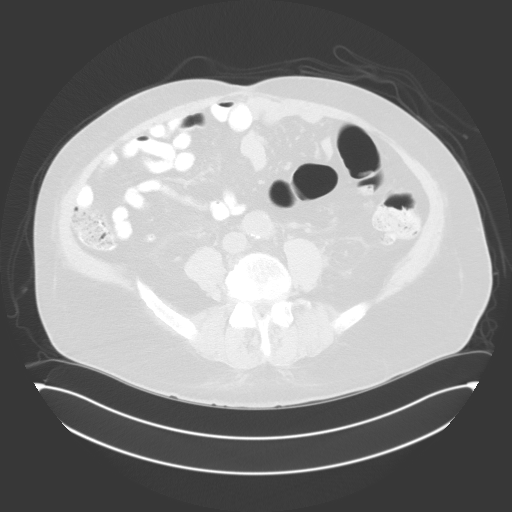
[im 57/137  mediastinal]
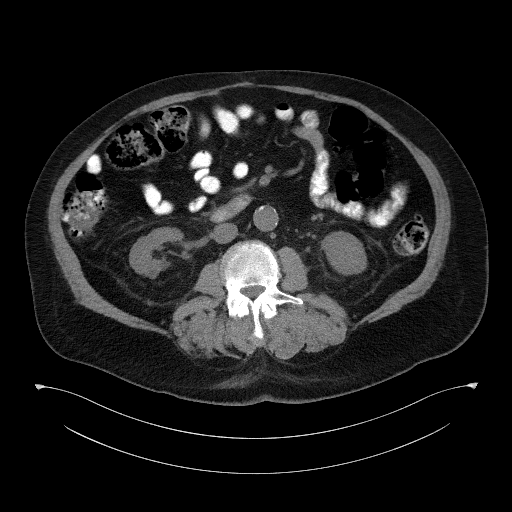
[im 57/137  lung]
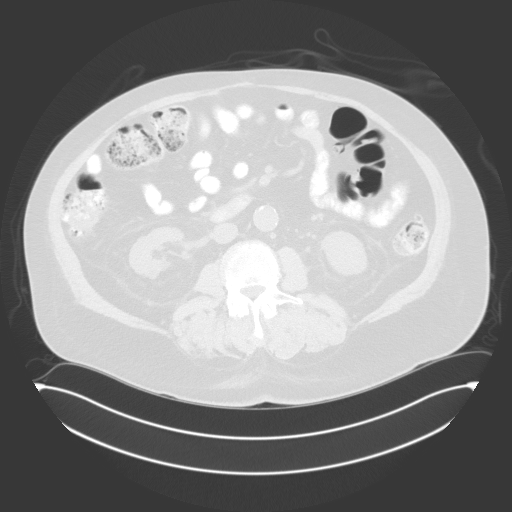
[im 80/137  lung]
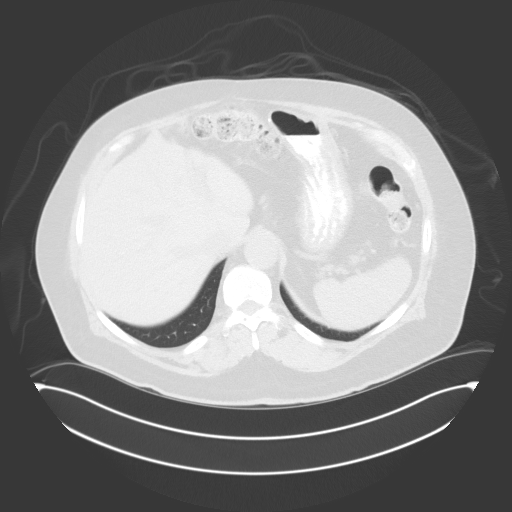
[im 91/137  lung]
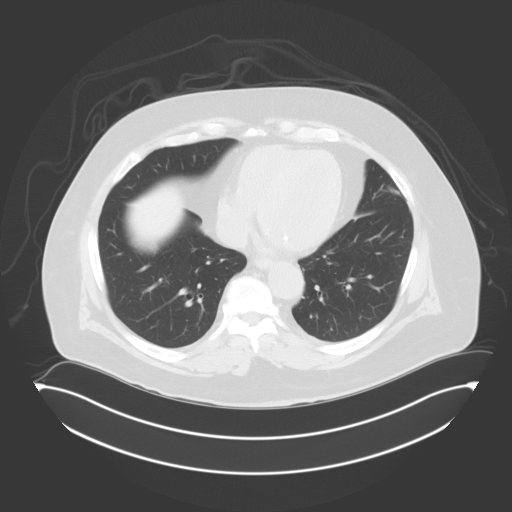
[im 103/137  lung]
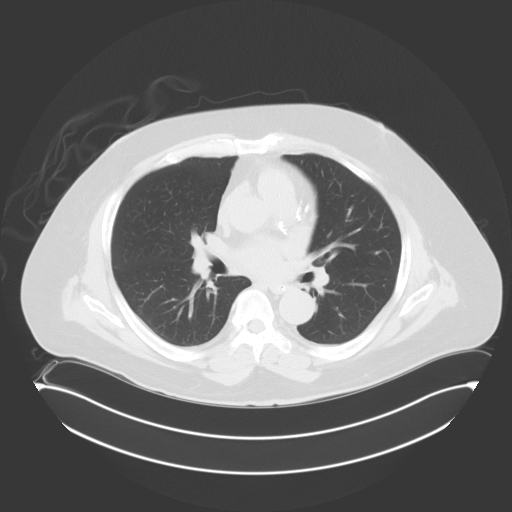
[im 114/137  mediastinal]
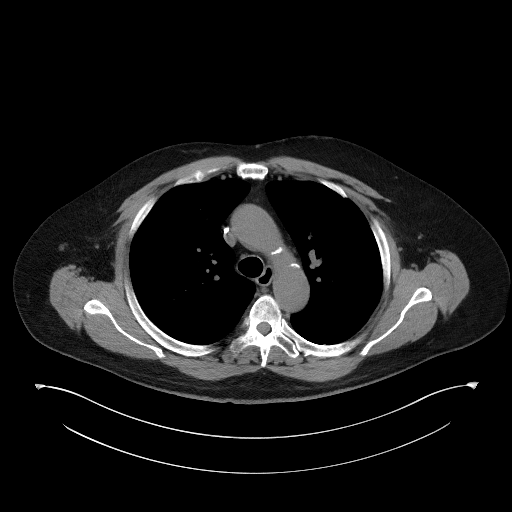
[im 114/137  lung]
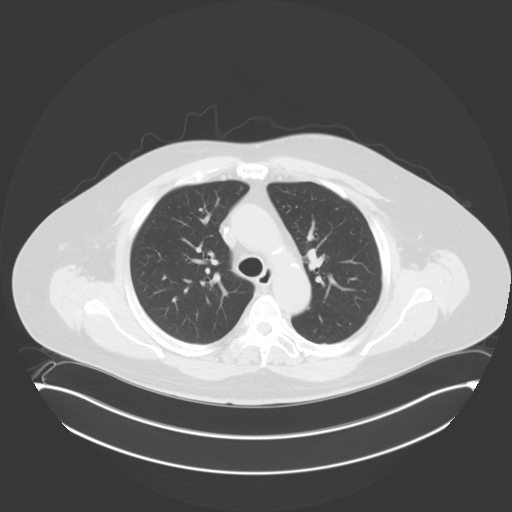
[im 125/137  lung]
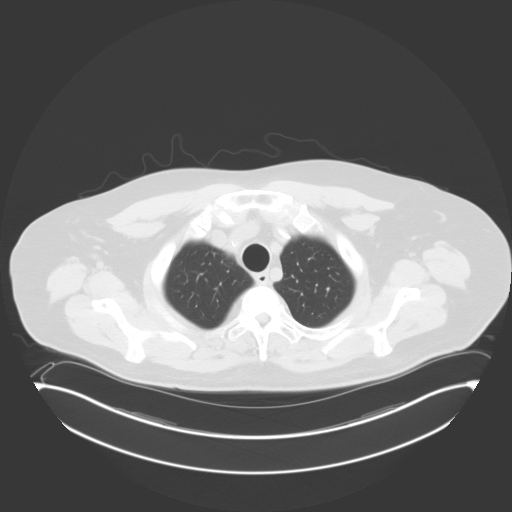

[Series 4: coronals · coronal · 0.88mm/px · 3 of 156 slices shown]
[im 32/156  lung]
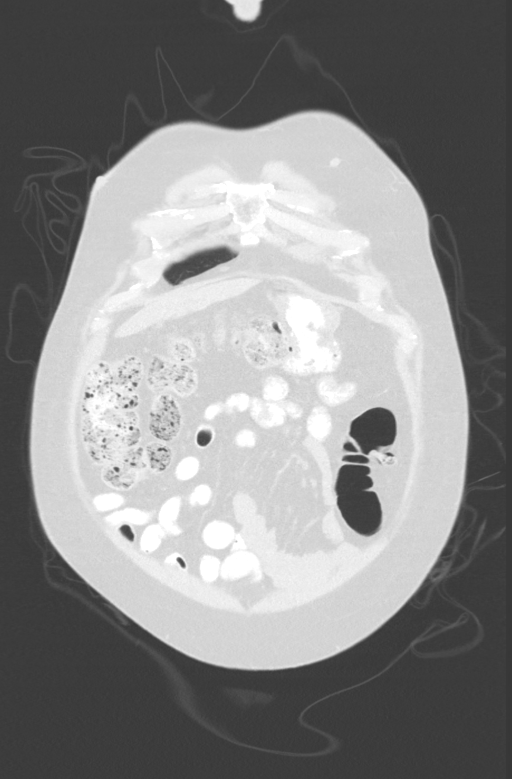
[im 63/156  lung]
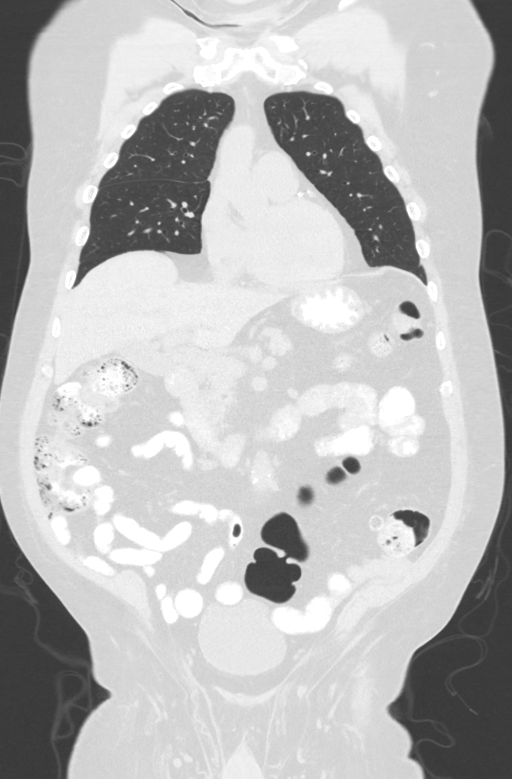
[im 94/156  lung]
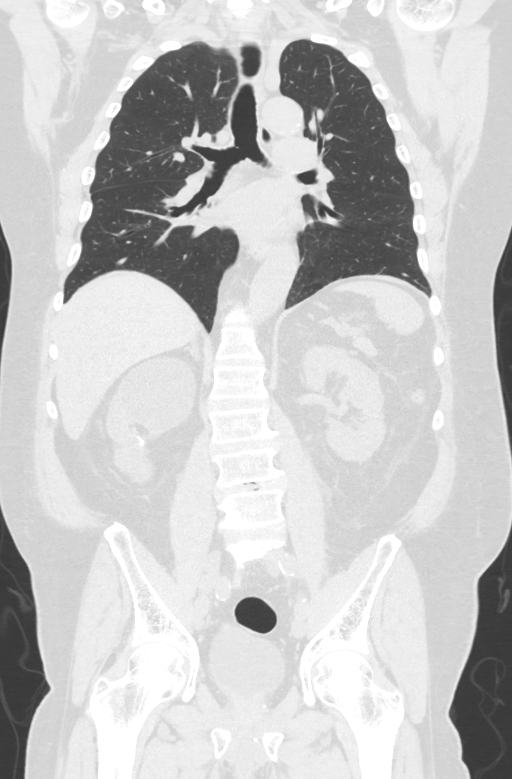

[13 of 36 positions shown; findings below may reference images not displayed]

FINDINGS: CT CHEST FINDINGS

Cardiovascular: Normal size heart. No pericardial effusion.
Three-vessel coronary artery disease. Left chest wall port with tip
in the superior vena cava. Atherosclerotic calcifications of the
aorta. Calcifications mitral annulus.

Mediastinum/Nodes: No enlarged mediastinal, hilar, or axillary lymph
nodes. Thyroid gland, trachea, and esophagus demonstrate no
significant findings.

Lungs/Pleura: Stable 3 mm right upper lobe pulmonary nodule (series
6, image 47). No new suspicious pulmonary nodules or masses. No
pleural effusion or pneumothorax.

Musculoskeletal: No chest wall mass or suspicious bone lesions
identified.

CT ABDOMEN PELVIS FINDINGS

Hepatobiliary: No focal liver abnormality is seen. No gallstones,
gallbladder wall thickening, or biliary dilatation.

Pancreas: Unremarkable. No pancreatic ductal dilatation or
surrounding inflammatory changes.

Spleen: Normal in size without focal abnormality.

Adrenals/Urinary Tract: Adrenal glands are unremarkable. No
hydronephrosis. Unchanged size of the 7 cm right upper pole renal
cyst. Unchanged size of the 1.4 cm rim calcified hypodense right
interpolar renal lesion which is incompletely characterize without
IV contrast.

Stomach/Bowel: Stable changes of distal gastrectomy and
gastrojejunostomy. The gastric lumen is partially decompressed
limiting evaluation however there is no new/suspicious wall
thickening. Ingested radiopaque contrast extends throughout the
small bowel without evidence of bowel obstruction or suspicious wall
thickening. Normal appendix. Scattered colonic diverticulosis. Small
volume of formed stool throughout the colon. No suspicious colonic
wall thickening.

Vascular/Lymphatic: Aortic atherosclerosis. No enlarged abdominal or
pelvic lymph nodes.

Reproductive: Fiducial markers noted within the prostate gland.

Other: Near complete resolution of the fluid collection along the
undersurface of the ventral abdominal wall. No ascites. Postsurgical
change in the anterior abdominal wall. Subcutaneous anterior
abdominal wall nodularity, likely sequela of injections.

Musculoskeletal: Multilevel degenerative change of the spine,
similar prior. No suspicious lytic or blastic lesions of bone. No
acute osseous abnormality.
IMPRESSION: 1. No evidence of recurrent or metastatic disease within the chest,
abdomen, or pelvis.
2. Unchanged size of the 1.4 cm rim calcified hypodense right
interpolar renal lesion which is incompletely characterize without
IV contrast.
3. Aortic atherosclerosis.

Aortic Atherosclerosis ([YW]-[YW]).

## 2019-12-23 MED ORDER — SODIUM CHLORIDE 0.9% FLUSH
10.0000 mL | Freq: Once | INTRAVENOUS | Status: AC
  Administered 2019-12-23: 10 mL
  Filled 2019-12-23: qty 10

## 2019-12-23 MED ORDER — HEPARIN SOD (PORK) LOCK FLUSH 100 UNIT/ML IV SOLN: 500.0000 [IU] | Freq: Once | INTRAVENOUS | Status: AC

## 2019-12-23 MED ORDER — HEPARIN SOD (PORK) LOCK FLUSH 100 UNIT/ML IV SOLN
INTRAVENOUS | Status: AC
  Administered 2019-12-23: 500 [IU] via INTRAVENOUS
  Filled 2019-12-23: qty 5

## 2019-12-26 LAB — MULTIPLE MYELOMA PANEL, SERUM
Albumin SerPl Elph-Mcnc: 3.1 g/dL (ref 2.9–4.4)
Albumin/Glob SerPl: 1.2 (ref 0.7–1.7)
Alpha 1: 0.2 g/dL (ref 0.0–0.4)
Alpha2 Glob SerPl Elph-Mcnc: 0.8 g/dL (ref 0.4–1.0)
B-Globulin SerPl Elph-Mcnc: 0.9 g/dL (ref 0.7–1.3)
Gamma Glob SerPl Elph-Mcnc: 0.9 g/dL (ref 0.4–1.8)
Globulin, Total: 2.8 g/dL (ref 2.2–3.9)
IgA: 281 mg/dL (ref 61–437)
IgG (Immunoglobin G), Serum: 946 mg/dL (ref 603–1613)
IgM (Immunoglobulin M), Srm: 30 mg/dL (ref 15–143)
M Protein SerPl Elph-Mcnc: 0.2 g/dL — ABNORMAL HIGH
Total Protein ELP: 5.9 g/dL — ABNORMAL LOW (ref 6.0–8.5)

## 2019-12-26 LAB — KAPPA/LAMBDA LIGHT CHAINS
Kappa free light chain: 139.6 mg/L — ABNORMAL HIGH (ref 3.3–19.4)
Kappa, lambda light chain ratio: 3.3 — ABNORMAL HIGH (ref 0.26–1.65)
Lambda free light chains: 42.3 mg/L — ABNORMAL HIGH (ref 5.7–26.3)

## 2019-12-28 ENCOUNTER — Inpatient Hospital Stay: Payer: Medicare Other | Admitting: Oncology

## 2019-12-28 ENCOUNTER — Other Ambulatory Visit: Payer: Self-pay

## 2019-12-28 VITALS — BP 170/95 | HR 79 | Temp 98.9°F | Resp 20 | Ht 74.0 in | Wt 253.8 lb

## 2019-12-28 DIAGNOSIS — D472 Monoclonal gammopathy: Secondary | ICD-10-CM

## 2019-12-28 DIAGNOSIS — C169 Malignant neoplasm of stomach, unspecified: Secondary | ICD-10-CM | POA: Diagnosis not present

## 2019-12-28 DIAGNOSIS — D631 Anemia in chronic kidney disease: Secondary | ICD-10-CM | POA: Diagnosis not present

## 2019-12-28 DIAGNOSIS — Z95828 Presence of other vascular implants and grafts: Secondary | ICD-10-CM | POA: Diagnosis not present

## 2019-12-28 DIAGNOSIS — N189 Chronic kidney disease, unspecified: Secondary | ICD-10-CM | POA: Diagnosis not present

## 2019-12-28 NOTE — Progress Notes (Signed)
Hematology and Oncology Follow Up Visit  Jacob [name] [id number] 08-Jun-1945 74 y.o. 12/28/2019 1:37 PM Patient, No Pcp Jacob Guardian, MD   Principle Diagnosis:  74 year old with gastric cancer diagnosed in October 2017.  He was found to have T3N0 adenocarcinoma at that time.  Secondary diagnoses:  1.  Stage T1c prostate cancer diagnosed in 2013.  He was found to have Gleason score 3+4 = 7 and a PSA 5.66.  His disease in remission.  2.  IgG lambda MGUS diagnosed in 2017.  No evidence of active myeloma or indication for treatment.  3.  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  Prior Therapy:  He is status post radiation therapy for definitive treatment for his prostate cancer. Therapy concluded in December 2013. He is status post packed red cell transfusions in April 2017. He is status post bone marrow biopsy in April 2017. Results did not show any plasma cell disorder or myelodysplasia. He is status post IV iron infusion completed in April 2017. This was repeated in September 2017. FOLFOX chemotherapy with cycle 1 to be given on 12/12/2015.  He is S/P cycle 5 of therapy given on 02/20/2016. He is status post distal gastrectomy and Billroth II reconstruction completed on 03/25/2016. The final pathology revealed a T3 N0 residual tumor without any lymphadenopathy involved.  Current therapy: Active surveillance.   Interim History:  Jacob [name] returns today for repeat evaluation.  Since the last visit, he was hospitalized in the early part of December for a hypokalemia.  He was scheduled to have a Port-A-Cath removal and was found to have potassium of 6.9 on December 7 and repeated was 7.4.  He was hospitalized briefly and was treated with Kayexalate among other interventions to lower his potassium.  He will follow-up with nephrology on outpatient.  Since his discharge, clinically he reports a feeling well without any complaints at this time.  He denies excessive fatigue,  tiredness or dyspnea on exertion.  He denies any hematochezia or melena.      Medications: Unchanged on review. Current Outpatient Medications  Medication Sig Dispense Refill  . acetaminophen (TYLENOL) 500 MG tablet Take 1,000 mg by mouth daily as needed for moderate pain or headache.    . allopurinol (ZYLOPRIM) 300 MG tablet Take 300 mg by mouth daily.    Marland Kitchen buPROPion (WELLBUTRIN XL) 300 MG 24 hr tablet Take 300 mg by mouth daily.     . calcium carbonate (TUMS - DOSED IN MG ELEMENTAL CALCIUM) 500 MG chewable tablet Chew 3 tablets by mouth 2 (two) times daily as needed for indigestion or heartburn.    . cetirizine (ZYRTEC ALLERGY) 10 MG tablet Take 10 mg by mouth daily.    . diclofenac Sodium (VOLTAREN) 1 % GEL Apply 1 application topically 2 (two) times daily as needed (pain).     . furosemide (LASIX) 20 MG tablet Take 20 mg by mouth daily.     Marland Kitchen gabapentin (NEURONTIN) 400 MG capsule Take 400 mg by mouth.    . hydrALAZINE (APRESOLINE) 25 MG tablet Take 25 mg by mouth 3 (three) times daily.     . Melatonin 10 MG TABS Take 10 mg by mouth at bedtime as needed (sleep).     . Nebivolol HCl (BYSTOLIC) 20 MG TABS Take 20 mg by mouth daily.     . pantoprazole (PROTONIX) 40 MG tablet Take 1 tablet (40 mg total) by mouth daily before supper. (Patient not taking: Reported on 12/16/2019) 30 tablet 0  . polycarbophil (FIBERCON)  625 MG tablet Take 1 tablet (625 mg total) by mouth daily. 30 tablet 3  . polyethylene glycol (MIRALAX / GLYCOLAX) 17 g packet Take 17 g by mouth 2 (two) times daily. 14 each 0  . prochlorperazine (COMPAZINE) 10 MG tablet TAKE ONE TABLET BY MOUTH EVERY 6 HOURS AS NEEDED FOR NAUSSEA AND VOMITING (Patient taking differently: Take 10 mg by mouth every 6 (six) hours as needed for nausea or vomiting. ) 30 tablet 1  . sodium bicarbonate 650 MG tablet Take 650 mg by mouth 2 (two) times daily.    . sodium zirconium cyclosilicate (LOKELMA) 5 g packet Take 5 g by mouth 2 (two) times a week.  15 packet 0  . tamsulosin (FLOMAX) 0.4 MG CAPS capsule Take 1 capsule (0.4 mg total) by mouth daily after supper. (Patient taking differently: Take 0.4 mg by mouth daily. ) 90 capsule 3  . venlafaxine (EFFEXOR) 75 MG tablet Take 75 mg by mouth 2 (two) times daily.    . verapamil (CALAN) 80 MG tablet Take 80 mg by mouth 3 (three) times daily.      No current facility-administered medications for this visit.     Allergies:  No Known Allergies     Physical Exam:   Blood pressure (!) 170/95, pulse 79, temperature 98.9 F (37.2 C), temperature source Tympanic, resp. rate 20, height _0  (1.88 m), weight 253 lb 12.8 oz (115.1 kg), SpO2 100 %.    ECOG: 1   General appearance: Alert, awake without any distress. Head: Atraumatic without abnormalities Oropharynx: Without any thrush or ulcers. Eyes: No scleral icterus. Lymph nodes: No lymphadenopathy noted in the cervical, supraclavicular, or axillary nodes Heart:regular rate and rhythm, without any murmurs or gallops.   Lung: Clear to auscultation without any rhonchi, wheezes or dullness to percussion. Abdomin: Soft, nontender without any shifting dullness or ascites. Musculoskeletal: No clubbing or cyanosis. Neurological: No motor or sensory deficits. Skin: No rashes or lesions. Psychiatric: Mood and affect appeared normal.        Lab Results: Lab Results  Component Value Date   WBC 5.1 12/23/2019   HGB 8.8 (L) 12/23/2019   HCT 26.9 (L) 12/23/2019   MCV 98.2 12/23/2019   PLT 159 12/23/2019     Chemistry      Component Value Date/Time   NA 138 12/23/2019 1233   NA 141 01/16/2017 0924   K 5.1 12/23/2019 1233   K 4.8 01/16/2017 0924   CL 110 12/23/2019 1233   CO2 20 (L) 12/23/2019 1233   CO2 25 01/16/2017 0924   BUN 27 (H) 12/23/2019 1233   BUN 24.0 01/16/2017 0924   CREATININE 2.62 (H) 12/23/2019 1233   CREATININE 1.9 (H) 01/16/2017 0924      Component Value Date/Time   CALCIUM 9.2 12/23/2019 1233    CALCIUM 9.8 01/16/2017 0924   ALKPHOS 55 12/23/2019 1233   ALKPHOS 54 01/16/2017 0924   AST 19 12/23/2019 1233   AST 16 01/16/2017 0924   ALT 10 12/23/2019 1233   ALT 10 01/16/2017 0924   BILITOT 0.5 12/23/2019 1233   BILITOT 0.54 01/16/2017 0924       IMPRESSION: 1. No evidence of recurrent or metastatic disease within the chest, abdomen, or pelvis. 2. Unchanged size of the 1.4 cm rim calcified hypodense right interpolar renal lesion which is incompletely characterize without IV contrast. 3. Aortic atherosclerosis.  Aortic Atherosclerosis (ICD10-I70.0   Results for LATASHA, PUSKAS (MRN [id number]) as of 12/28/2019 13:38  Ref. Range  12/14/2018 11:02 12/23/2019 12:33  M Protein SerPl Elph-Mcnc Latest Ref Range: Not Observed g/dL Not Observed 0.2 (H)  IFE 1 Unknown Comment (A) Comment (A)  Globulin, Total Latest Ref Range: 2.2 - 3.9 g/dL 3.3 2.8  B-Globulin SerPl Elph-Mcnc Latest Ref Range: 0.7 - 1.3 g/dL 1.0 0.9  IgG (Immunoglobin G), Serum Latest Ref Range: 603 - 1,613 mg/dL 1,065 946  IgM (Immunoglobulin M), Srm Latest Ref Range: 15 - 143 mg/dL 32 30  IgA Latest Ref Range: 61 - 437 mg/dL 272 281    IImpression and Plan:  74 year old man with:    1.  Adenocarcinoma of the stomach diagnosed in 2017.  He was found to have T3N0 M0 disease.   CT scan obtained on 12/23/2019 was personally reviewed and showed no evidence of disease relapse.  Risks and benefits of continued active surveillance and risk of relapse was assessed today.  At this time his risk of relapse is low and the plan is to repeat imaging studies in 12 months.   the natural course of his disease was reviewed at this time.  Risk of relapse was also discussed.  Imaging studies obtained in December 2020 showed no evidence of disease relapse.  I recommended continued active surveillance and repeat imaging studies in December 2021 and concluded that staging scans in 2022.  Risk of relapse remains low at this  time.  2.  Anemia: Related to chronic renal insufficiency.  He might require growth factor support in the future.   3.  IgG MGUS: The natural course of this disease was reviewed at this time.  Laboratory testing obtained on December 23, 2019 was personally reviewed showed an M spike that has been stable over the last 4 to 5 years.  I recommended annual testing at this time.  No evidence of multiple myeloma or indication for treatment.  4. Renal insufficiency: His creatinine is overall stable although he did develop hyperkalemia.  He has follow-up with nephrology in the near future.  5. IV access: Port-A-Cath will be removed in the near future by Dr. Barry Dienes.  6. Follow-up: He will return in 6 months for a follow-up visit.  30  minutes were spent on this encounter.  The time was dedicated to reviewing disease status, discussing treatment options, reviewing imaging studies and future plan of care review.   Zola Button, MD 12/15/20211:37 PM

## 2020-01-20 ENCOUNTER — Other Ambulatory Visit: Payer: Self-pay | Admitting: Urology

## 2020-02-10 ENCOUNTER — Encounter: Payer: Self-pay | Admitting: Podiatry

## 2020-02-10 ENCOUNTER — Other Ambulatory Visit: Payer: Self-pay

## 2020-02-10 ENCOUNTER — Ambulatory Visit: Payer: Medicare Other | Admitting: Podiatry

## 2020-02-10 DIAGNOSIS — B351 Tinea unguium: Secondary | ICD-10-CM

## 2020-02-10 DIAGNOSIS — L84 Corns and callosities: Secondary | ICD-10-CM | POA: Diagnosis not present

## 2020-02-10 DIAGNOSIS — N183 Chronic kidney disease, stage 3 unspecified: Secondary | ICD-10-CM

## 2020-02-10 DIAGNOSIS — M79674 Pain in right toe(s): Secondary | ICD-10-CM

## 2020-02-10 DIAGNOSIS — M79675 Pain in left toe(s): Secondary | ICD-10-CM | POA: Diagnosis not present

## 2020-02-10 DIAGNOSIS — D631 Anemia in chronic kidney disease: Secondary | ICD-10-CM | POA: Diagnosis not present

## 2020-02-10 DIAGNOSIS — G629 Polyneuropathy, unspecified: Secondary | ICD-10-CM

## 2020-02-12 ENCOUNTER — Other Ambulatory Visit: Payer: Self-pay | Admitting: Internal Medicine

## 2020-02-13 ENCOUNTER — Ambulatory Visit (HOSPITAL_COMMUNITY)
Admission: EM | Admit: 2020-02-13 | Discharge: 2020-02-13 | Disposition: A | Discharge status: Home / Self Care | Payer: Medicare Other | Attending: Internal Medicine | Admitting: Internal Medicine

## 2020-02-13 ENCOUNTER — Other Ambulatory Visit: Payer: Self-pay

## 2020-02-13 ENCOUNTER — Encounter (HOSPITAL_COMMUNITY): Payer: Self-pay

## 2020-02-13 DIAGNOSIS — R0789 Other chest pain: Secondary | ICD-10-CM | POA: Diagnosis not present

## 2020-02-13 NOTE — ED Provider Notes (Signed)
[facility]    CSN: [id number] Arrival date & time: 02/13/20  1215      History   Chief Complaint Chief Complaint  Patient presents with   Shoulder Pain    Right shoulder pain     HPI Kobe Cassis is a 75 y.o. male comes to the urgent care with left rib and left shoulder pain of 6 days duration.  Patient describes the pain as sharp, moderate severity and associated with muscle spasms.  Patient denies a fall or trauma to the left side of the chest.  No chest tightness.  Pain is aggravated by palpation.  No known relieving factors.  No shortness of breath or chest pressure.  No cough, sputum production, fever or chills.   HPI  Past Medical History:  Diagnosis Date   Anemia    hx iron deficiency   Anxiety    Arthritis    gout   Back pain    BPH with obstruction/lower urinary tract symptoms    Chronic kidney disease    "they said I do"   Constipation    Depression    ED (erectile dysfunction)    Epigastric pain    GERD (gastroesophageal reflux disease)    Heartburn    History of blood transfusion    History of radiation therapy 11/03/11-12/29/11   prostate   Hypercholesterolemia    Hypertension    Night sweats    Oxygen deficiency    2x per week at night for sleep   Post-operative nausea and vomiting 04/09/2016   Prostate cancer ([place]) 08/14/11   Adenocarcinoma,gleason:3+3=6,& 3+4=7,PSA=5.66   PUD (peptic ulcer disease)    Sleep apnea    does not use Cpap but does use O2 @ 2l    Stomach cancer ([place])    Ulcer    peptic ulcer hx    Patient Active Problem List   Diagnosis Date Noted   Hyperkalemia 12/20/2019   History of stomach cancer 74/94/4967   Metabolic acidosis 59/16/3846   Hypoglycemia due to insulin 12/20/2019   Hypertensive urgency 12/20/2019   Personal history of colonic polyps    Diverticulosis of colon without hemorrhage    Multiple polyps of sigmoid colon    AKI (acute kidney injury) ([place])  09/15/2018   CKD (chronic kidney disease), stage III ([place]) 09/15/2018   Pneumonia 09/09/2018   Acute renal failure superimposed on stage 3 chronic kidney disease ([place]) 09/09/2018   SBO (small bowel obstruction) ([place]) 09/09/2018   Port-A-Cath in place 11/23/2017   Post-operative nausea and vomiting 04/09/2016   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   Genetic testing 12/19/2015   Stomach cancer ([place])    Gastric adenocarcinoma ([place])    Gastric mass    GI bleed 10/26/2015   Gastrointestinal hemorrhage    PUD (peptic ulcer disease)    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   Nocturnal hypoxemia 11/04/2013   Noncompliance with treatment 09/02/2013   Chronic kidney disease, stage II (mild) 08/30/2013   Morbid obesity ([place]) 07/26/2013   Fatigue 07/26/2013   Prostate cancer ([place]) 08/14/2011   Iron deficiency anemia 03/19/2007   DEPRESSION 03/19/2007   Essential hypertension 03/19/2007   NEOPLASM, BENIGN, STOMACH 11/23/2006   POLYP, COLON 11/23/2006   Grade I internal hemorrhoids 11/23/2006   GASTRITIS 11/23/2006    Past Surgical History:  Procedure Laterality Date   colon polyps bx  11/24/06   colon,transverse and rectosigmoid polyps:tubular adenomas and hyperplastic polyps,no high grade dysplasia or malignancy  COLONOSCOPY WITH PROPOFOL N/A 08/24/2019   Procedure: COLONOSCOPY WITH PROPOFOL;  Surgeon: [name], DO;  Location: [facility];  Service: Gastroenterology;  Laterality: N/A;   duodenal bx  11/24/06   benign   ESOPHAGOGASTRODUODENOSCOPY N/A 10/27/2015   Procedure: ESOPHAGOGASTRODUODENOSCOPY (EGD);  Surgeon: [name], MD;  Location: [facility];  Service: Endoscopy;  Laterality: N/A;   EUS N/A 11/08/2015   Procedure: UPPER ENDOSCOPIC ULTRASOUND (EUS) RADIAL;  Surgeon: [name], MD;  Location: [facility];  Service: Endoscopy;  Laterality: N/A;   GASTRECTOMY N/A 03/25/2016   Procedure: DISTAL  GASTRECTOMY;  Surgeon: [name], MD;  Location: [facility];  Service: General;  Laterality: N/A;   gastric bx  11/24/06   chronic active gastritis,with metaplasia and focal changaes of xanthelasma   GASTROSTOMY N/A 03/25/2016   Procedure: INSERTION OF FEEDING TUBE;  Surgeon: [name], MD;  Location: [facility];  Service: General;  Laterality: N/A;   INSERTION PROSTATE RADIATION SEED  12-29-11   IR GENERIC HISTORICAL  04/01/2016   IR [place] TUBE CHANGE 04/01/2016 Markus Daft, MD [facility]   IR GENERIC HISTORICAL  04/05/2016   IR [place] TUBE CHANGE 04/05/2016 Markus Daft, MD [facility]   LAPAROSCOPIC GASTRECTOMY  03/25/2016   Diagnostic laparoscopy, distal gastrectomy with Billroth 2 reconstruction and gastrojejunostomy tube   LAPAROSCOPY N/A 03/25/2016   Procedure: DIAGNOSTIC LAPAROSCOPY;  Surgeon: [name], MD;  Location: [facility];  Service: General;  Laterality: N/A;   POLYPECTOMY  08/24/2019   Procedure: POLYPECTOMY;  Surgeon: [name], DO;  Location: [facility];  Service: Gastroenterology;;   PORTACATH PLACEMENT Left 12/04/2015   Procedure: INSERTION PORT-A-CATH;  Surgeon: [name], MD;  Location: [facility];  Service: General;  Laterality: Left;   PROSTATE BIOPSY  08/14/11   Adenocarcinoma/volume=58.51cc,gleason=3+3=6 & 3+4=7   TONSILLECTOMY     75 years old       Home Medications    Prior to Admission medications   Medication Sig Start Date End Date Taking? Authorizing Provider  acetaminophen (TYLENOL) 500 MG tablet Take 1,000 mg by mouth daily as needed for moderate pain or headache.    [provider]  allopurinol (ZYLOPRIM) 300 MG tablet Take 300 mg by mouth daily. 04/15/19   [provider]  buPROPion (WELLBUTRIN XL) 300 MG 24 hr tablet Take 300 mg by mouth daily.  05/10/15   [provider]  calcium carbonate (TUMS - DOSED IN MG ELEMENTAL CALCIUM) 500 MG chewable tablet Chew 3 tablets by mouth 2 (two) times daily as needed for indigestion or  heartburn.    [provider]  cetirizine (ZYRTEC ALLERGY) 10 MG tablet Take 10 mg by mouth daily.    [provider]  diclofenac Sodium (VOLTAREN) 1 % GEL Apply 1 application topically 2 (two) times daily as needed (pain).  02/19/19   [provider]  furosemide (LASIX) 20 MG tablet Take 20 mg by mouth daily.     [provider]  gabapentin (NEURONTIN) 400 MG capsule Take 400 mg by mouth.    [provider]  hydrALAZINE (APRESOLINE) 25 MG tablet Take 25 mg by mouth 3 (three) times daily.  08/05/18   [provider]  LOKELMA 5 g packet TAKE 5 GRAMS BY MOUTH TWO TIMES A WEEK 02/13/20   Fayrene Helper, MD  Melatonin 10 MG TABS Take 10 mg by mouth at bedtime as needed (sleep).     [provider]  Nebivolol HCl (BYSTOLIC) 20 MG TABS Take  20 mg by mouth daily.     [provider]  pantoprazole (PROTONIX) 40 MG tablet Take 1 tablet (40 mg total) by mouth daily before supper. Patient not taking: Reported on 12/16/2019 09/18/18   Kayleen Memos, DO  polycarbophil (FIBERCON) 625 MG tablet Take 1 tablet (625 mg total) by mouth daily. 04/21/16   [name], MD  polyethylene glycol (MIRALAX / GLYCOLAX) 17 g packet Take 17 g by mouth 2 (two) times daily. 09/18/18   Kayleen Memos, DO  prochlorperazine (COMPAZINE) 10 MG tablet TAKE ONE TABLET BY MOUTH EVERY 6 HOURS AS NEEDED FOR NAUSSEA AND VOMITING Patient taking differently: Take 10 mg by mouth every 6 (six) hours as needed for nausea or vomiting.  12/13/19   Wyatt Portela, MD  sodium bicarbonate 650 MG tablet Take 650 mg by mouth 2 (two) times daily.    [provider]  tamsulosin (FLOMAX) 0.4 MG CAPS capsule Take 1 capsule (0.4 mg total) by mouth daily. 01/24/20   McKenzie, Candee Furbish, MD  venlafaxine (EFFEXOR) 75 MG tablet Take 75 mg by mouth 2 (two) times daily.    [provider]  verapamil (CALAN) 80 MG tablet Take 80 mg by mouth 3 (three) times daily.     [provider]    Family History Family History  Problem Relation Age of Onset   Cancer Mother        NOS   Alcohol abuse Father    Alcohol abuse Brother 86   Cancer Paternal Aunt        NOS   Colon cancer Neg Hx     Social History Social History   Tobacco Use   Smoking status: Former Smoker    Packs/day: 1.50    Years: 30.00    Pack years: 45.00    Types: Cigarettes    Quit date: 04/13/2000    Years since quitting: 19.8   Smokeless tobacco: Never Used  Vaping Use   Vaping Use: Never used  Substance Use Topics   Alcohol use: Yes    Alcohol/week: 1.0 standard drink    Types: 1 Cans of beer per week    Comment: a beer about once a week, less than in his youth   Drug use: No     Allergies   Patient has no known allergies.   Review of Systems Review of Systems  Constitutional: Negative.   Respiratory: Negative for cough, choking and shortness of breath.   Cardiovascular: Positive for chest pain.  Gastrointestinal: Negative.   Musculoskeletal: Negative for arthralgias and myalgias.  Skin: Negative.   Neurological: Negative.      Physical Exam Triage Vital Signs ED Triage Vitals  Enc Vitals Group     BP 02/13/20 1330 (S) (!) 180/107     Pulse Rate 02/13/20 1330 78     Resp 02/13/20 1330 20     Temp 02/13/20 1330 98 F (36.7 C)     Temp Source 02/13/20 1330 Oral     SpO2 02/13/20 1330 100 %     Weight --      Height --      Head Circumference --      Peak Flow --      Pain Score 02/13/20 1326 7     Pain Loc --      Pain Edu? --      Excl. in [place]? --    No data found.  Updated Vital Signs BP (S) (!) 167/101 (  BP Location: Right Arm)    Pulse 78    Temp 98 F (36.7 C) (Oral)    Resp 20    SpO2 100%   Visual Acuity Right Eye Distance:   Left Eye Distance:   Bilateral Distance:    Right Eye Near:   Left Eye Near:    Bilateral Near:     Physical Exam Vitals and nursing note reviewed.  Constitutional:      General: He is not in  acute distress.    Appearance: He is not ill-appearing.  Cardiovascular:     Rate and Rhythm: Normal rate and regular rhythm.     Pulses: Normal pulses.     Heart sounds: Normal heart sounds.  Pulmonary:     Effort: Pulmonary effort is normal.     Breath sounds: Normal breath sounds.  Abdominal:     General: Bowel sounds are normal.     Palpations: Abdomen is soft.  Musculoskeletal:        General: Normal range of motion.  Neurological:     Mental Status: He is alert.      UC Treatments / Results  Labs (all labs ordered are listed, but only abnormal results are displayed) Labs Reviewed - No data to display  EKG   Radiology No results found.  Procedures Procedures (including critical care time)  Medications Ordered in UC Medications - No data to display  Initial Impression / Assessment and Plan / UC Course  I have reviewed the triage vital signs and the nursing notes.  Pertinent labs & imaging results that were available during my care of the patient were reviewed by me and considered in my medical decision making (see chart for details).     1.  Chest wall pain: Tylenol as needed for pain EKG showed normal sinus rhythm with no acute ST/T wave changes Range of motion exercises Heating pad use advised Patient advised to return to urgent care if symptoms worsen If patient has persistent chest pain, he is advised to go to the emergency room for further evaluation.  Final Clinical Impressions(s) / UC Diagnoses   Final diagnoses:  Chest wall pain     Discharge Instructions     Heating pad use is beneficial Gentle range of motion exercises If you have worsening pain, please go to emergency room to be evaluated further. Take Tylenol for pain.    ED Prescriptions    None     PDMP not reviewed this encounter.   Chase Picket, MD 02/16/20 [phone]

## 2020-02-13 NOTE — Progress Notes (Signed)
Subjective:  Patient ID: Jacob [name], male    DOB: 01/17/1945,  MRN: [id number]  Jacob [name] presents to clinic today for at risk foot care. Patient has h/o CKD stage 3 and painful callus(es) b/l feet and painful thick toenails that are difficult to trim. Pain interferes with ambulation. Aggravating factors include wearing enclosed shoe gear. Pain is relieved with periodic professional debridement..  75 y.o. male presents with the above complaint. He relates no new pedal concerns on today's visit.  Review of Systems: Negative except as noted in the HPI. Past Medical History:  Diagnosis Date  . Anemia    hx iron deficiency  . Anxiety   . Arthritis    gout  . Back pain   . BPH with obstruction/lower urinary tract symptoms   . Chronic kidney disease    "they said I do"  . Constipation   . Depression   . ED (erectile dysfunction)   . Epigastric pain   . GERD (gastroesophageal reflux disease)   . Heartburn   . History of blood transfusion   . History of radiation therapy 11/03/11-12/29/11   prostate  . Hypercholesterolemia   . Hypertension   . Night sweats   . Oxygen deficiency    2x per week at night for sleep  . Post-operative nausea and vomiting 04/09/2016  . Prostate cancer ([place]) 08/14/11   Adenocarcinoma,gleason:3+3=6,& 3+4=7,PSA=5.66  . PUD (peptic ulcer disease)   . Sleep apnea    does not use Cpap but does use O2 @ 2l   . Stomach cancer ([place])   . Ulcer    peptic ulcer hx   Past Surgical History:  Procedure Laterality Date  . colon polyps bx  11/24/06   colon,transverse and rectosigmoid polyps:tubular adenomas and hyperplastic polyps,no high grade dysplasia or malignancy   . COLONOSCOPY WITH PROPOFOL N/A 08/24/2019   Procedure: COLONOSCOPY WITH PROPOFOL;  Surgeon: [name], DO;  Location: [facility];  Service: Gastroenterology;  Laterality: N/A;  . duodenal bx  11/24/06   benign  . ESOPHAGOGASTRODUODENOSCOPY N/A 10/27/2015   Procedure:  ESOPHAGOGASTRODUODENOSCOPY (EGD);  Surgeon: [name], MD;  Location: [facility];  Service: Endoscopy;  Laterality: N/A;  . EUS N/A 11/08/2015   Procedure: UPPER ENDOSCOPIC ULTRASOUND (EUS) RADIAL;  Surgeon: [name], MD;  Location: [facility];  Service: Endoscopy;  Laterality: N/A;  . GASTRECTOMY N/A 03/25/2016   Procedure: DISTAL GASTRECTOMY;  Surgeon: [name], MD;  Location: [place];  Service: General;  Laterality: N/A;  . gastric bx  11/24/06   chronic active gastritis,with metaplasia and focal changaes of xanthelasma  . GASTROSTOMY N/A 03/25/2016   Procedure: INSERTION OF FEEDING TUBE;  Surgeon: [name], MD;  Location: [place];  Service: General;  Laterality: N/A;  . INSERTION PROSTATE RADIATION SEED  12-29-11  . IR GENERIC HISTORICAL  04/01/2016   IR GJ TUBE CHANGE 04/01/2016 Markus Daft, MD [facility]  . IR GENERIC HISTORICAL  04/05/2016   IR GJ TUBE CHANGE 04/05/2016 Markus Daft, MD [facility]  . LAPAROSCOPIC GASTRECTOMY  03/25/2016   Diagnostic laparoscopy, distal gastrectomy with Billroth 2 reconstruction and gastrojejunostomy tube  . LAPAROSCOPY N/A 03/25/2016   Procedure: DIAGNOSTIC LAPAROSCOPY;  Surgeon: [name], MD;  Location: [place];  Service: General;  Laterality: N/A;  . POLYPECTOMY  08/24/2019   Procedure: POLYPECTOMY;  Surgeon: [name], DO;  Location: [facility];  Service: Gastroenterology;;  . Sol Passer PLACEMENT Left 12/04/2015   Procedure: INSERTION PORT-A-CATH;  Surgeon: [name], MD;  Location: [facility];  Service: General;  Laterality: Left;  . PROSTATE BIOPSY  08/14/11   Adenocarcinoma/volume=58.51cc,gleason=3+3=6 & 3+4=7  . TONSILLECTOMY     75 years old    Current Outpatient Medications:  .  acetaminophen (TYLENOL) 500 MG tablet, Take 1,000 mg by mouth daily as needed for moderate pain or headache., Disp: , Rfl:  .  allopurinol (ZYLOPRIM) 300 MG tablet, Take 300 mg by mouth daily., Disp: , Rfl:  .  buPROPion (WELLBUTRIN XL) 300  MG 24 hr tablet, Take 300 mg by mouth daily. , Disp: , Rfl:  .  calcium carbonate (TUMS - DOSED IN MG ELEMENTAL CALCIUM) 500 MG chewable tablet, Chew 3 tablets by mouth 2 (two) times daily as needed for indigestion or heartburn., Disp: , Rfl:  .  cetirizine (ZYRTEC ALLERGY) 10 MG tablet, Take 10 mg by mouth daily., Disp: , Rfl:  .  diclofenac Sodium (VOLTAREN) 1 % GEL, Apply 1 application topically 2 (two) times daily as needed (pain). , Disp: , Rfl:  .  furosemide (LASIX) 20 MG tablet, Take 20 mg by mouth daily. , Disp: , Rfl:  .  gabapentin (NEURONTIN) 400 MG capsule, Take 400 mg by mouth., Disp: , Rfl:  .  hydrALAZINE (APRESOLINE) 25 MG tablet, Take 25 mg by mouth 3 (three) times daily. , Disp: , Rfl:  .  Melatonin 10 MG TABS, Take 10 mg by mouth at bedtime as needed (sleep). , Disp: , Rfl:  .  Nebivolol HCl (BYSTOLIC) 20 MG TABS, Take 20 mg by mouth daily. , Disp: , Rfl:  .  pantoprazole (PROTONIX) 40 MG tablet, Take 1 tablet (40 mg total) by mouth daily before supper. (Patient not taking: Reported on 12/16/2019), Disp: 30 tablet, Rfl: 0 .  polycarbophil (FIBERCON) 625 MG tablet, Take 1 tablet (625 mg total) by mouth daily., Disp: 30 tablet, Rfl: 3 .  polyethylene glycol (MIRALAX / GLYCOLAX) 17 g packet, Take 17 g by mouth 2 (two) times daily., Disp: 14 each, Rfl: 0 .  prochlorperazine (COMPAZINE) 10 MG tablet, TAKE ONE TABLET BY MOUTH EVERY 6 HOURS AS NEEDED FOR NAUSSEA AND VOMITING (Patient taking differently: Take 10 mg by mouth every 6 (six) hours as needed for nausea or vomiting. ), Disp: 30 tablet, Rfl: 1 .  sodium bicarbonate 650 MG tablet, Take 650 mg by mouth 2 (two) times daily., Disp: , Rfl:  .  sodium zirconium cyclosilicate (LOKELMA) 5 g packet, Take 5 g by mouth 2 (two) times a week., Disp: 15 packet, Rfl: 0 .  tamsulosin (FLOMAX) 0.4 MG CAPS capsule, Take 1 capsule (0.4 mg total) by mouth daily., Disp: 90 capsule, Rfl: 3 .  venlafaxine (EFFEXOR) 75 MG tablet, Take 75 mg by mouth 2  (two) times daily., Disp: , Rfl:  .  verapamil (CALAN) 80 MG tablet, Take 80 mg by mouth 3 (three) times daily. , Disp: , Rfl:  No Known Allergies Social History   Occupational History  . Occupation: retired    Fish farm manager: Public house manager  Tobacco Use  . Smoking status: Former Smoker    Packs/day: 1.50    Years: 30.00    Pack years: 45.00    Types: Cigarettes    Quit date: 04/13/2000    Years since quitting: 19.8  . Smokeless tobacco: Never Used  Vaping Use  . Vaping Use: Never used  Substance and Sexual Activity  . Alcohol use: Yes    Alcohol/week: 1.0 standard drink    Types: 1 Cans of beer  per week    Comment: a beer about once a week, less than in his youth  . Drug use: No  . Sexual activity: Not Currently    Objective:   Constitutional Jacob [name] is a pleasant 75 y.o. African American male, obese in NAD.  AAO x 3.   Vascular Dorsalis pedis pulses palpable bilaterally. Posterior tibial pulses palpable bilaterally. Capillary refill normal to all digits. No cyanosis or clubbing noted. Pedal hair growth diminished b/l. Lower extremity skin temperature gradient within normal limits. No edema noted b/l lower extremities.  Neurologic Normal speech. Oriented to person, place, and time. Protective sensation decreased with 10 gram monofilament b/l. Vibratory sensation intact b/l. Proprioception intact bilaterally.  Dermatologic Pedal skin with normal turgor, texture and tone b/l.  Toenails are discolored, thick, elongated, dystrophic with pain on palpation x 10 . No open wounds. No skin lesions. No interdigital macerations bilaterally. Hyperkeratotic lesion(s) submet head 4 left foot and submet head 4 right foot.  No erythema, no edema, no drainage, no flocculence.  Orthopedic: Normal muscle strength 5/5 to all lower extremity muscle groups bilaterally. No pain crepitus or joint limitation noted with ROM b/l. Hallux valgus with bunion deformity noted b/l lower extremities. Pes planus  deformity noted b/l.  No bony tenderness.    Radiographs: None Assessment:   1. Pain due to onychomycosis of toenails of both feet   2. Callus   3. Anemia in stage 3 chronic kidney disease, unspecified whether stage 3a or 3b CKD ([place])   4. Neuropathy    Plan:  Patient was evaluated and treated and all questions answered.  Onychomycosis with pain -Nails palliatively debridement as below -Educated on self-care  Procedure: Nail Debridement Rationale: Pain Type of Debridement: manual, sharp debridement. Instrumentation: Nail nipper, rotary burr. Number of Nails: 10 -Examined patient. -No new findings. No new orders. -Toenails 1-5 b/l were debrided in length and girth with sterile nail nippers and dremel without iatrogenic bleeding.  -Callus(es) submet head 4 left foot and submet head 4 right foot pared utilizing sterile scalpel blade without complication or incident. Total number debrided =2. -Patient to report any pedal injuries to medical professional immediately. -Patient to continue soft, supportive shoe gear daily. -Patient/POA to call should there be question/concern in the interim.  Return in about 3 months (around 05/10/2020).  Marzetta Board, DPM

## 2020-02-13 NOTE — ED Triage Notes (Signed)
Patient presents to Urgent Care with complaints of left rib and shoulder pain x 6 days ago. Pt reports pain initially started on the left leg that has improved x 1 week ago. Pt states symptoms are similar to a muscle spasm. Treating pain with Tylenol. Pt states he is on a potassium medication due to hyperkalemia back in December.   Denies fever or SOB.

## 2020-02-13 NOTE — Discharge Instructions (Addendum)
Heating pad use is beneficial Gentle range of motion exercises If you have worsening pain, please go to emergency room to be evaluated further. Take Tylenol for pain.

## 2020-05-11 ENCOUNTER — Emergency Department (HOSPITAL_COMMUNITY): Payer: Medicare Other

## 2020-05-11 ENCOUNTER — Emergency Department (HOSPITAL_COMMUNITY)
Admission: EM | Admit: 2020-05-11 | Discharge: 2020-05-11 | Disposition: A | Discharge status: Home / Self Care | Payer: Medicare Other | Attending: Emergency Medicine | Admitting: Emergency Medicine

## 2020-05-11 ENCOUNTER — Encounter (HOSPITAL_COMMUNITY): Payer: Self-pay

## 2020-05-11 ENCOUNTER — Ambulatory Visit (HOSPITAL_COMMUNITY)
Admission: EM | Admit: 2020-05-11 | Discharge: 2020-05-11 | Disposition: A | Discharge status: Home / Self Care | Payer: Medicare Other

## 2020-05-11 ENCOUNTER — Other Ambulatory Visit: Payer: Self-pay

## 2020-05-11 DIAGNOSIS — Z8546 Personal history of malignant neoplasm of prostate: Secondary | ICD-10-CM | POA: Insufficient documentation

## 2020-05-11 DIAGNOSIS — I10 Essential (primary) hypertension: Secondary | ICD-10-CM

## 2020-05-11 DIAGNOSIS — Z20822 Contact with and (suspected) exposure to covid-19: Secondary | ICD-10-CM | POA: Insufficient documentation

## 2020-05-11 DIAGNOSIS — Z9181 History of falling: Secondary | ICD-10-CM | POA: Diagnosis not present

## 2020-05-11 DIAGNOSIS — R2689 Other abnormalities of gait and mobility: Secondary | ICD-10-CM | POA: Diagnosis not present

## 2020-05-11 DIAGNOSIS — Z87891 Personal history of nicotine dependence: Secondary | ICD-10-CM | POA: Diagnosis not present

## 2020-05-11 DIAGNOSIS — Z79899 Other long term (current) drug therapy: Secondary | ICD-10-CM | POA: Diagnosis not present

## 2020-05-11 DIAGNOSIS — I129 Hypertensive chronic kidney disease with stage 1 through stage 4 chronic kidney disease, or unspecified chronic kidney disease: Secondary | ICD-10-CM | POA: Diagnosis not present

## 2020-05-11 DIAGNOSIS — R42 Dizziness and giddiness: Secondary | ICD-10-CM

## 2020-05-11 DIAGNOSIS — N183 Chronic kidney disease, stage 3 unspecified: Secondary | ICD-10-CM | POA: Insufficient documentation

## 2020-05-11 DIAGNOSIS — R413 Other amnesia: Secondary | ICD-10-CM | POA: Insufficient documentation

## 2020-05-11 LAB — CBC
HCT: 31.5 % — ABNORMAL LOW (ref 39.0–52.0)
Hemoglobin: 9.9 g/dL — ABNORMAL LOW (ref 13.0–17.0)
MCH: 31.3 pg (ref 26.0–34.0)
MCHC: 31.4 g/dL (ref 30.0–36.0)
MCV: 99.7 fL (ref 80.0–100.0)
Platelets: 174 10*3/uL (ref 150–400)
RBC: 3.16 MIL/uL — ABNORMAL LOW (ref 4.22–5.81)
RDW: 14.6 % (ref 11.5–15.5)
WBC: 6.5 10*3/uL (ref 4.0–10.5)
nRBC: 0 % (ref 0.0–0.2)

## 2020-05-11 LAB — COMPREHENSIVE METABOLIC PANEL
ALT: 9 U/L (ref 0–44)
AST: 15 U/L (ref 15–41)
Albumin: 3.6 g/dL (ref 3.5–5.0)
Alkaline Phosphatase: 51 U/L (ref 38–126)
Anion gap: 7 (ref 5–15)
BUN: 26 mg/dL — ABNORMAL HIGH (ref 8–23)
CO2: 20 mmol/L — ABNORMAL LOW (ref 22–32)
Calcium: 10.1 mg/dL (ref 8.9–10.3)
Chloride: 107 mmol/L (ref 98–111)
Creatinine, Ser: 2.73 mg/dL — ABNORMAL HIGH (ref 0.61–1.24)
GFR, Estimated: 24 mL/min — ABNORMAL LOW (ref >60)
Glucose, Bld: 85 mg/dL (ref 70–99)
Potassium: 4.6 mmol/L (ref 3.5–5.1)
Sodium: 134 mmol/L — ABNORMAL LOW (ref 135–145)
Total Bilirubin: 0.8 mg/dL (ref 0.3–1.2)
Total Protein: 7 g/dL (ref 6.5–8.1)

## 2020-05-11 LAB — RESP PANEL BY RT-PCR (FLU A&B, COVID) ARPGX2
Influenza A by PCR: NEGATIVE
Influenza B by PCR: NEGATIVE
SARS Coronavirus 2 by RT PCR: NEGATIVE

## 2020-05-11 LAB — URINALYSIS, ROUTINE W REFLEX MICROSCOPIC
Bacteria, UA: NONE SEEN
Bilirubin Urine: NEGATIVE
Glucose, UA: NEGATIVE mg/dL
Hgb urine dipstick: NEGATIVE
Ketones, ur: NEGATIVE mg/dL
Leukocytes,Ua: NEGATIVE
Nitrite: NEGATIVE
Protein, ur: 100 mg/dL — AB
Specific Gravity, Urine: 1.008 (ref 1.005–1.030)
pH: 6 (ref 5.0–8.0)

## 2020-05-11 LAB — DIFFERENTIAL
Abs Immature Granulocytes: 0.02 10*3/uL (ref 0.00–0.07)
Basophils Absolute: 0.1 10*3/uL (ref 0.0–0.1)
Basophils Relative: 1 %
Eosinophils Absolute: 0.4 10*3/uL (ref 0.0–0.5)
Eosinophils Relative: 5 %
Immature Granulocytes: 0 %
Lymphocytes Relative: 23 %
Lymphs Abs: 1.5 10*3/uL (ref 0.7–4.0)
Monocytes Absolute: 0.5 10*3/uL (ref 0.1–1.0)
Monocytes Relative: 8 %
Neutro Abs: 4.2 10*3/uL (ref 1.7–7.7)
Neutrophils Relative %: 63 %

## 2020-05-11 LAB — RAPID URINE DRUG SCREEN, HOSP PERFORMED
Amphetamines: NOT DETECTED
Barbiturates: NOT DETECTED
Benzodiazepines: NOT DETECTED
Cocaine: NOT DETECTED
Opiates: NOT DETECTED
Tetrahydrocannabinol: NOT DETECTED

## 2020-05-11 LAB — PROTIME-INR
INR: 1.1 (ref 0.8–1.2)
Prothrombin Time: 13.8 seconds (ref 11.4–15.2)

## 2020-05-11 LAB — APTT: aPTT: 36 seconds (ref 24–36)

## 2020-05-11 LAB — CBG MONITORING, ED: Glucose-Capillary: 92 mg/dL (ref 70–99)

## 2020-05-11 IMAGING — MR MR HEAD W/O CM
7 of 12 series · 25 of 48 positions shown · non-contrast
Comparison: Head CT same day

CLINICAL DATA: Memory and balance disturbance over the last week.

EXAM:
MRI HEAD WITHOUT CONTRAST
TECHNIQUE: Multiplanar, multiecho pulse sequences of the brain and surrounding
structures were obtained without intravenous contrast.

[Series 2: DWI · axial · 3.0mm · 0.94mm/px · z∈[-104,+20]mm · 7 of 100 slices shown (1 of 2)]
[im 1/100]
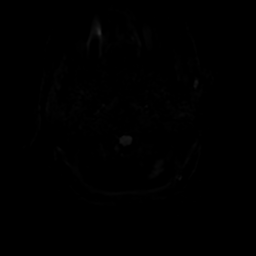
[im 17/100]
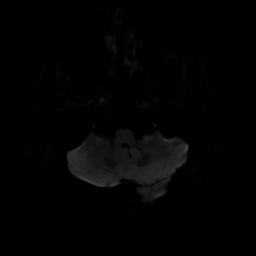
[im 34/100]
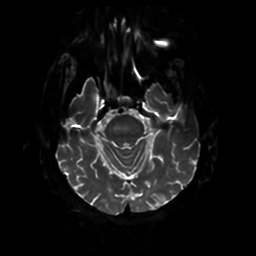
[im 50/100]
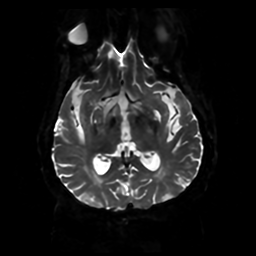
[im 67/100]
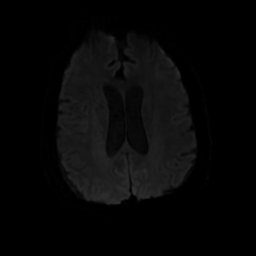
[im 83/100]
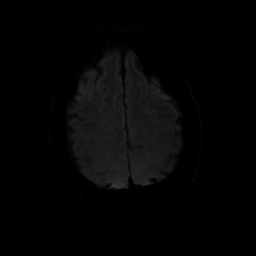
[im 100/100]
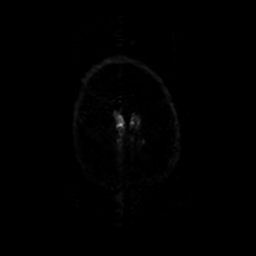

[Series 3: DWI · coronal · 4.0mm · 0.94mm/px · 6 of 78 slices shown (2 of 2)]
[im 1/78]
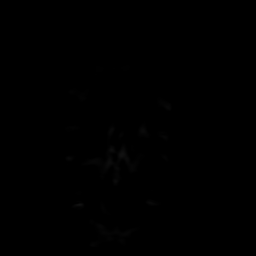
[im 16/78]
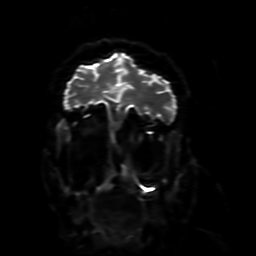
[im 31/78]
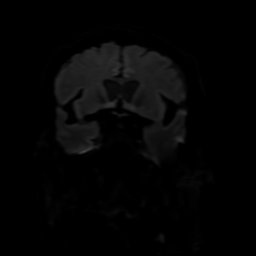
[im 47/78]
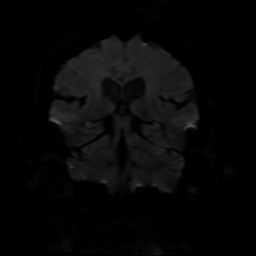
[im 62/78]
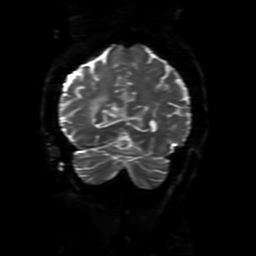
[im 78/78]
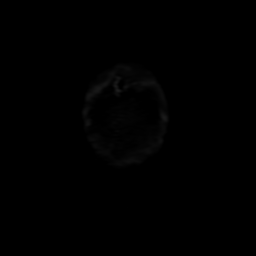

[Series 4: FLAIR · sagittal · 5.0mm · 0.25mm/px · 2 of 25 slices shown (1 of 2)]
[im 1/25]
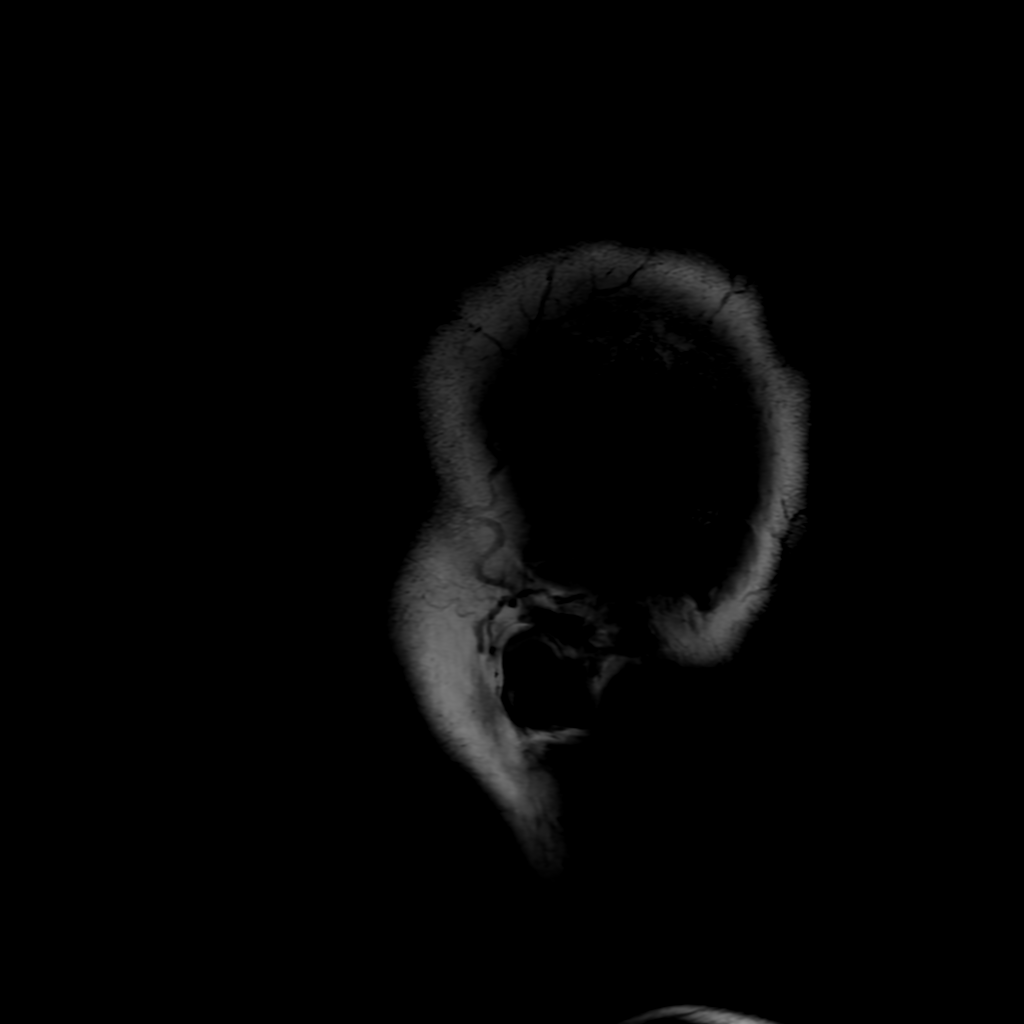
[im 25/25]
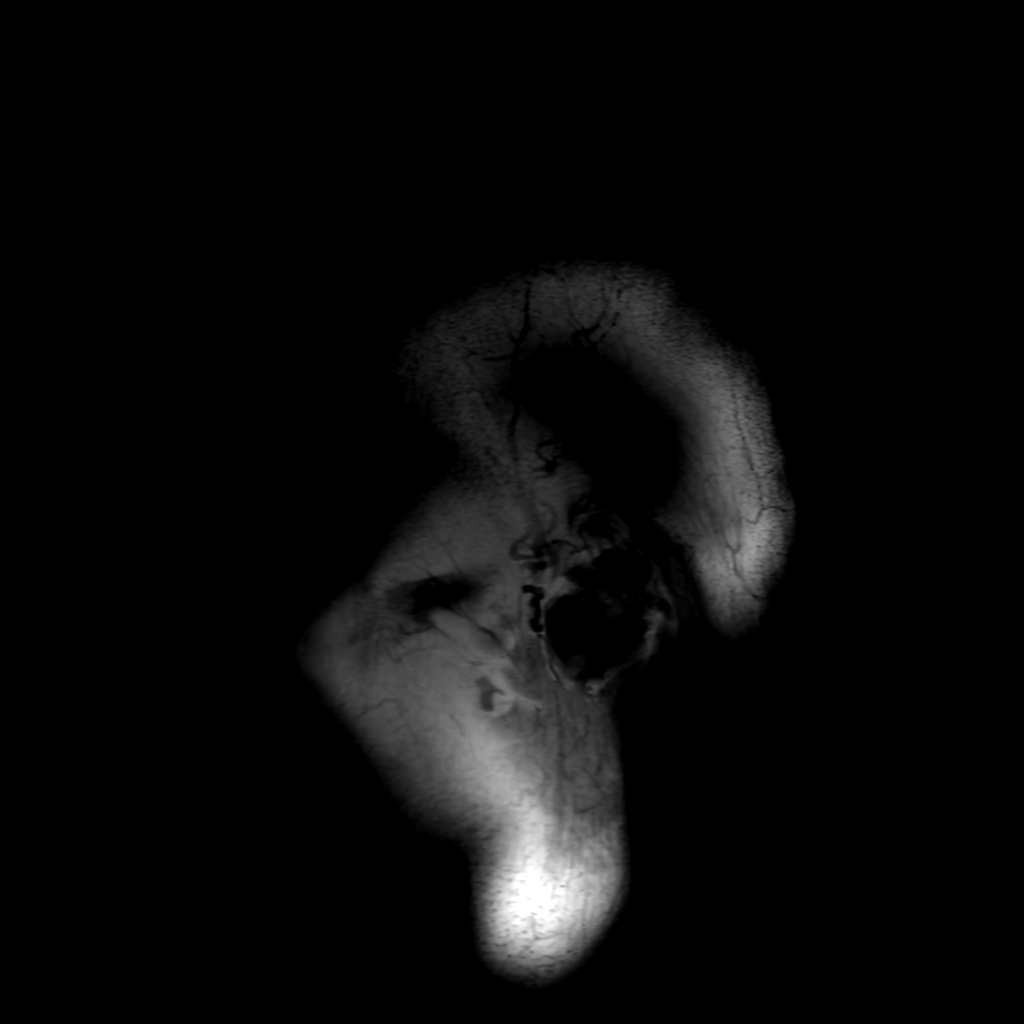

[Series 5: T2 · axial · 5.0mm · 0.23mm/px · 1 of 26 slices shown]
[im 1/26]
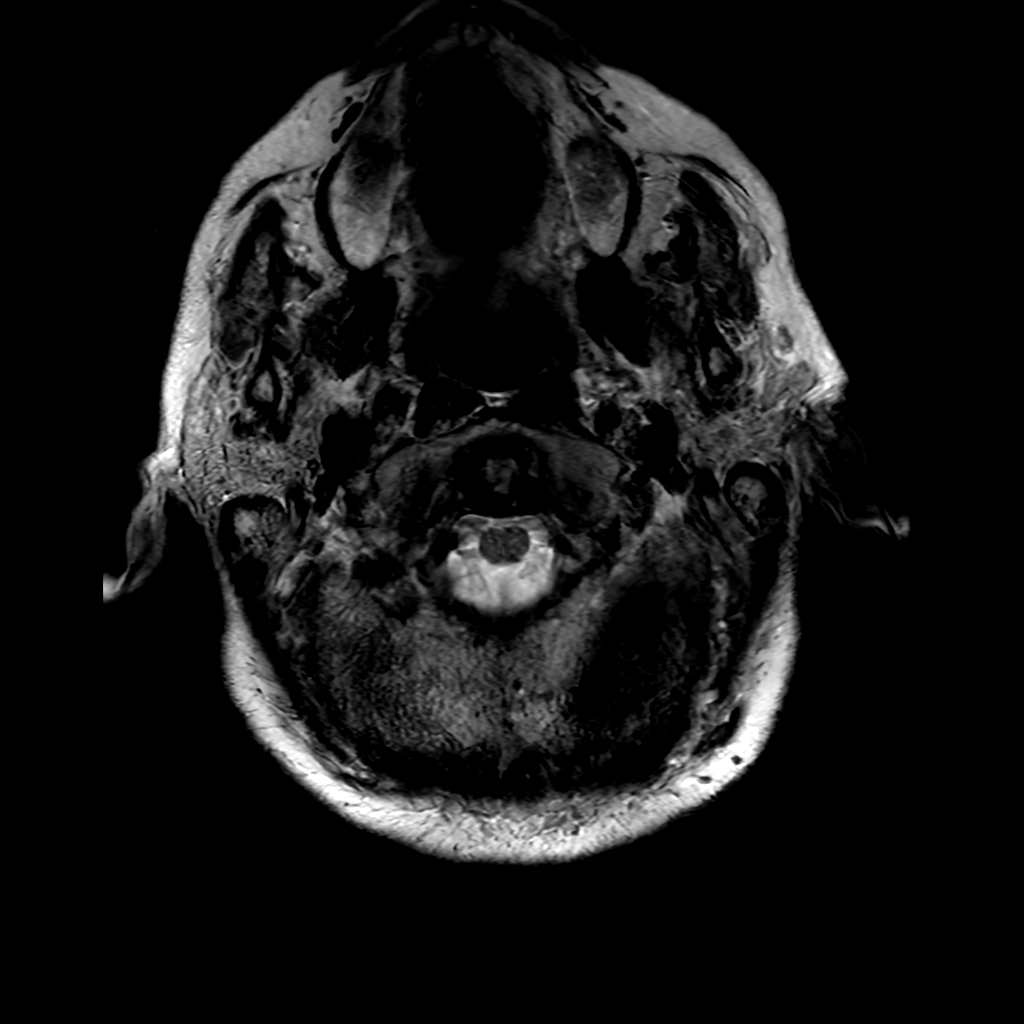

[Series 6: FLAIR · axial · 3.0mm · 0.45mm/px · z∈[-106,+20]mm · 2 of 26 slices shown (2 of 2)]
[im 1/26]
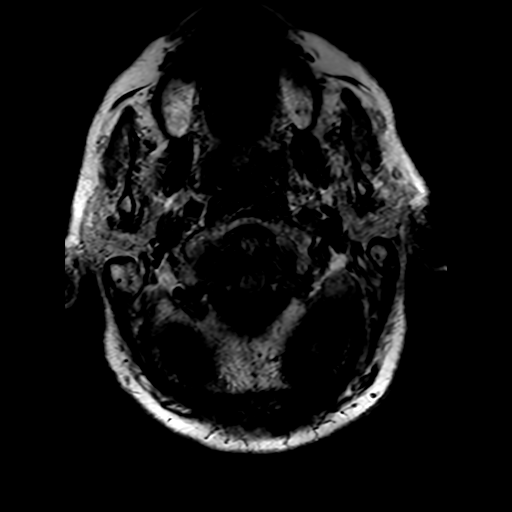
[im 26/26]
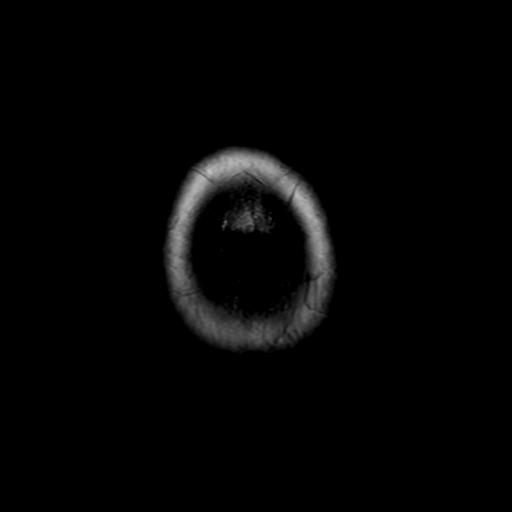

[Series 250: ADC · axial · 3.0mm · 0.94mm/px · z∈[-104,+20]mm · 4 of 50 slices shown (1 of 2)]
[im 1/50]
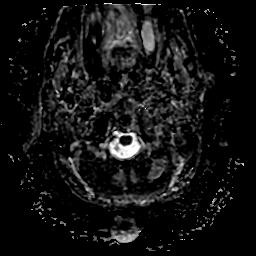
[im 17/50]
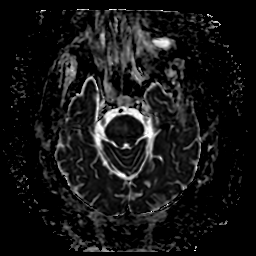
[im 33/50]
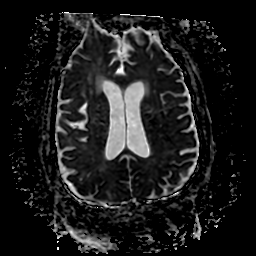
[im 50/50]
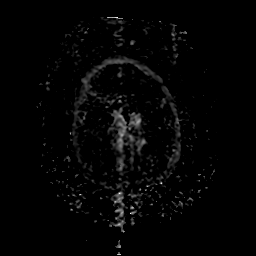

[Series 350: ADC · coronal · 4.0mm · 0.94mm/px · 3 of 39 slices shown (2 of 2)]
[im 1/39]
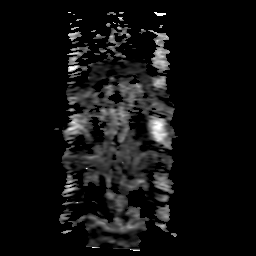
[im 20/39]
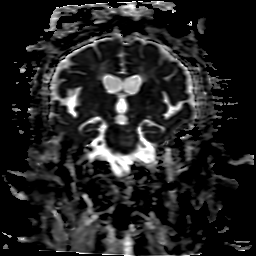
[im 39/39]
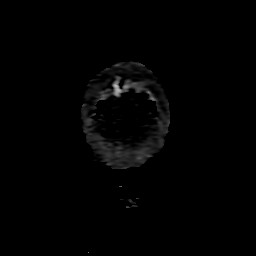

[25 of 48 positions shown; findings below may reference images not displayed]

FINDINGS: Brain: Diffusion imaging does not show any acute or subacute
infarction. Chronic small-vessel ischemic changes affect the pons.
No focal cerebellar finding. Cerebral hemispheres show generalized
atrophy with moderate chronic small-vessel ischemic changes of the
deep and subcortical white matter. No cortical or large vessel
territory infarction. No mass lesion, hemorrhage, hydrocephalus or
extra-axial collection.

Vascular: Major vessels at the base of the brain show flow.

Skull and upper cervical spine: Negative

Sinuses/Orbits: Left maxillary rhinosinusitis. Scattered opacified
ethmoid air cells. Orbits appear normal.

Other: None
IMPRESSION: 1. No acute or reversible finding. Generalized atrophy with moderate
chronic small-vessel ischemic changes of the pons and cerebral
hemispheric white matter.
2. Left maxillary rhinosinusitis.

## 2020-05-11 IMAGING — CT CT HEAD W/O CM
4 series · 16 of 47 positions shown, 18 images · non-contrast
Comparison: Head CT dated [DATE]

CLINICAL DATA: Status change, confusion.

EXAM:
CT HEAD WITHOUT CONTRAST
TECHNIQUE: Contiguous axial images were obtained from the base of the skull
through the vertex without intravenous contrast.

[Series 3: head wo · axial · 0.46mm/px · z∈[+1166,+1302]mm · 7 of 37 slices shown, 9 images]
[im 5/37  brain]
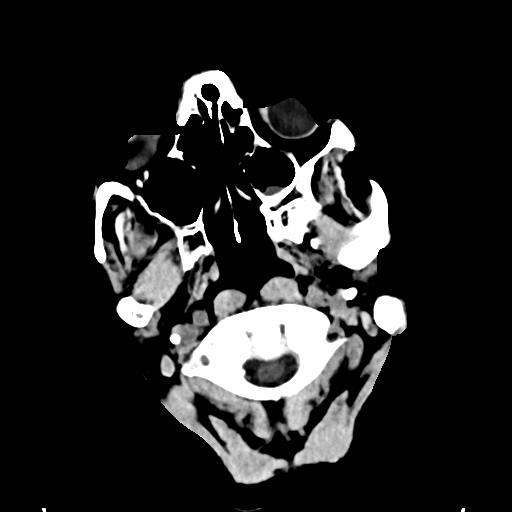
[im 5/37  bone]
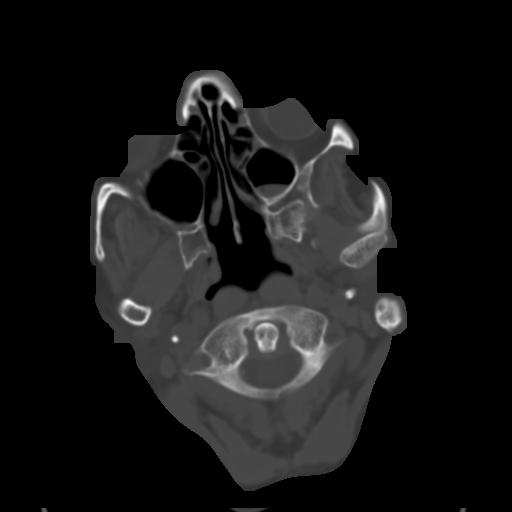
[im 10/37  brain]
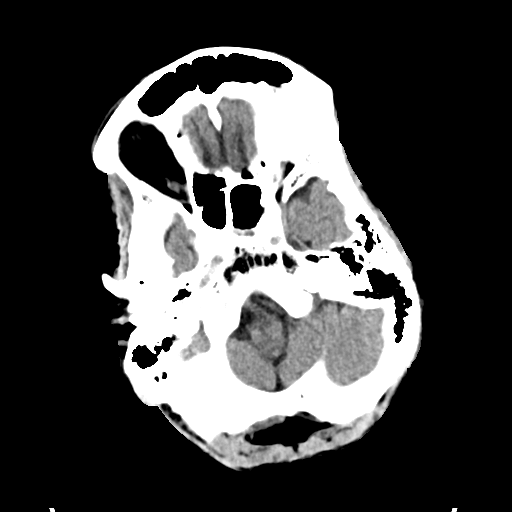
[im 14/37  brain]
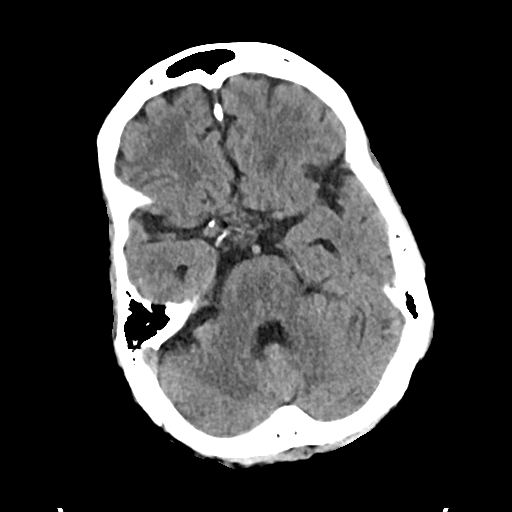
[im 19/37  brain]
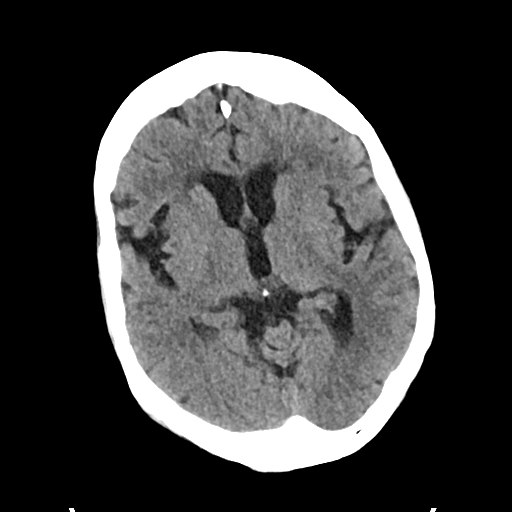
[im 23/37  brain]
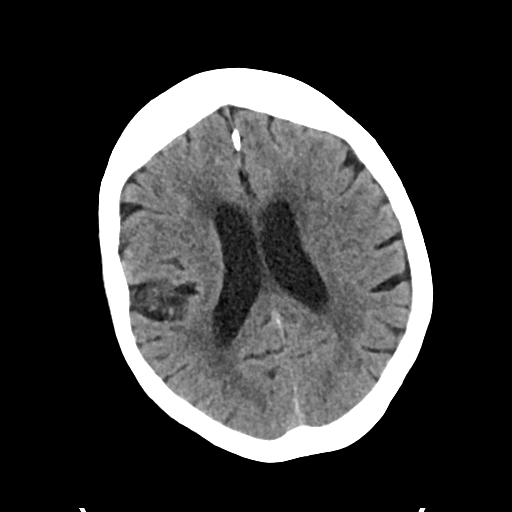
[im 23/37  bone]
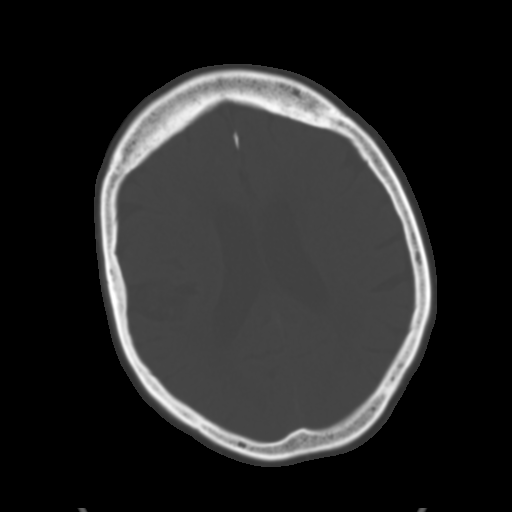
[im 28/37  brain]
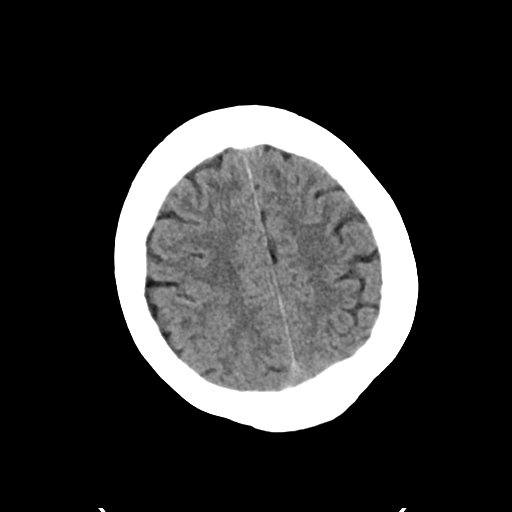
[im 32/37  brain]
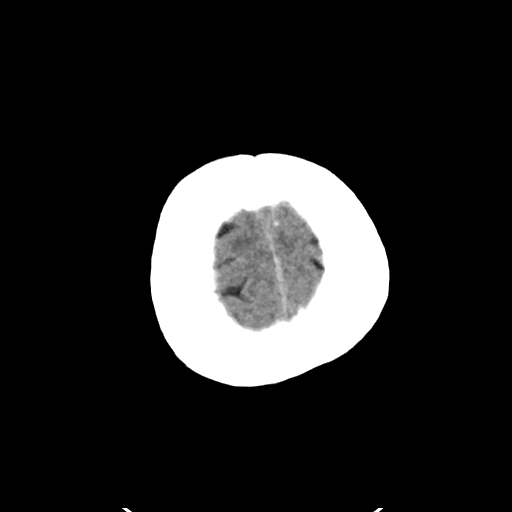

[Series 4: head bone · axial · 0.46mm/px · z∈[+1164,+1200]mm · 3 of 91 slices shown]
[im 10/91  bone]
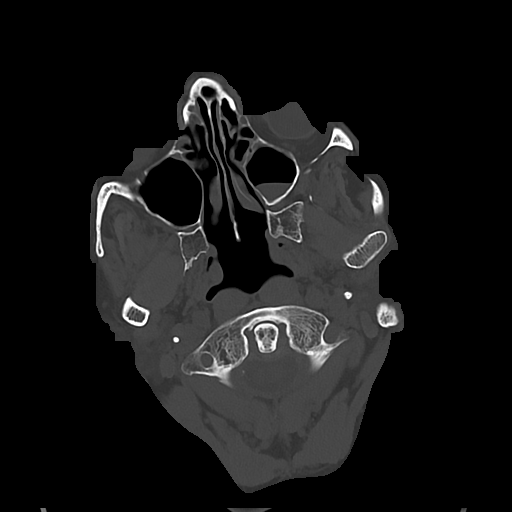
[im 19/91  bone]
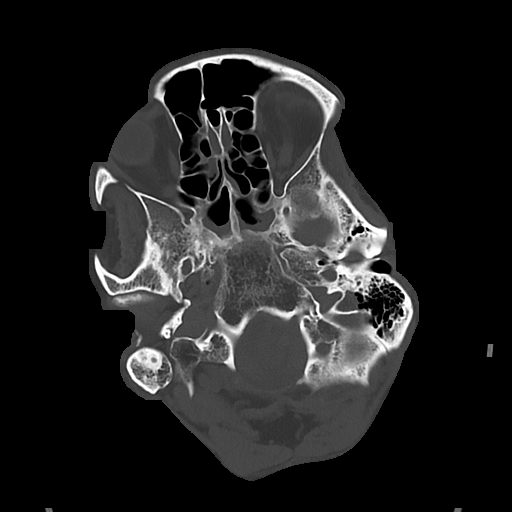
[im 28/91  bone]
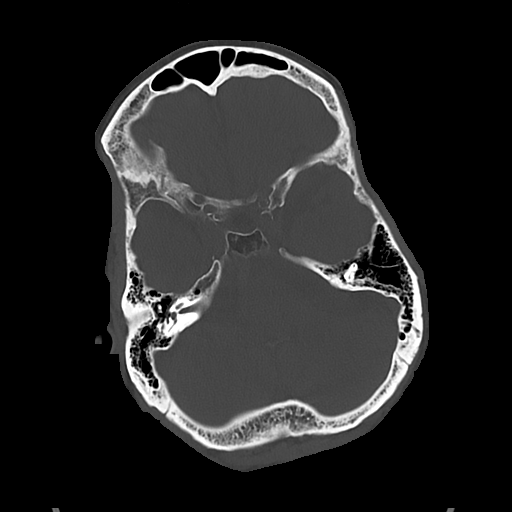

[Series 5: cor soft · coronal · 0.37mm/px · 3 of 78 slices shown]
[im 26/78  brain]
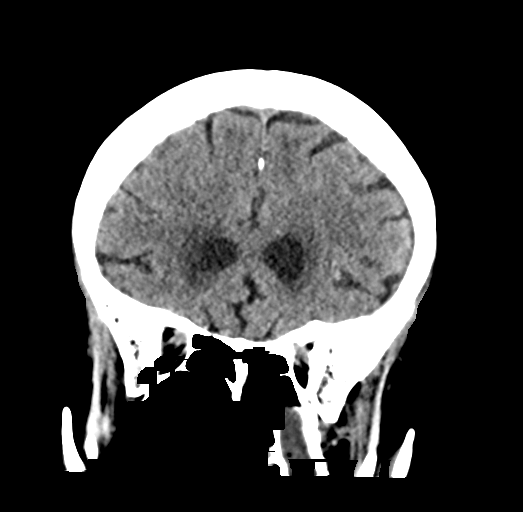
[im 35/78  brain]
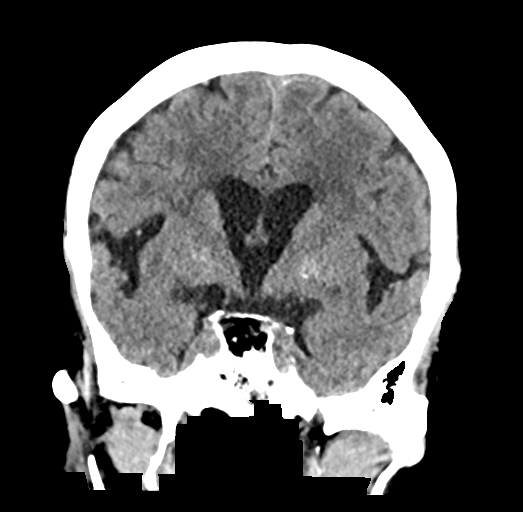
[im 43/78  brain]
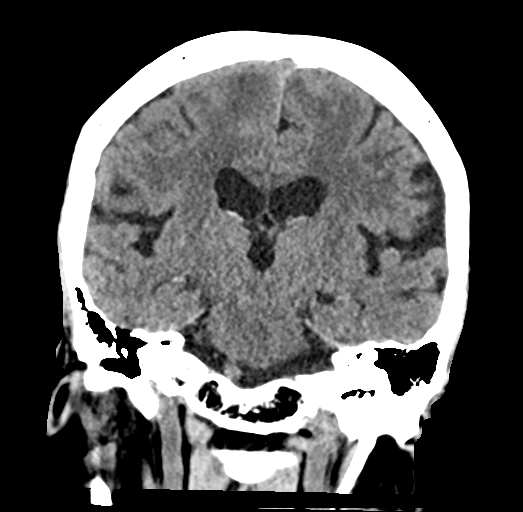

[Series 6: sag soft · sagittal · 0.37mm/px · 3 of 65 slices shown]
[im 22/65  brain]
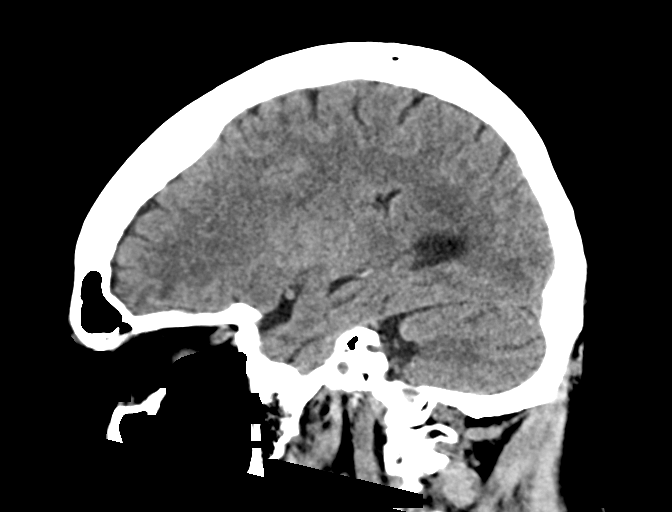
[im 33/65  brain]
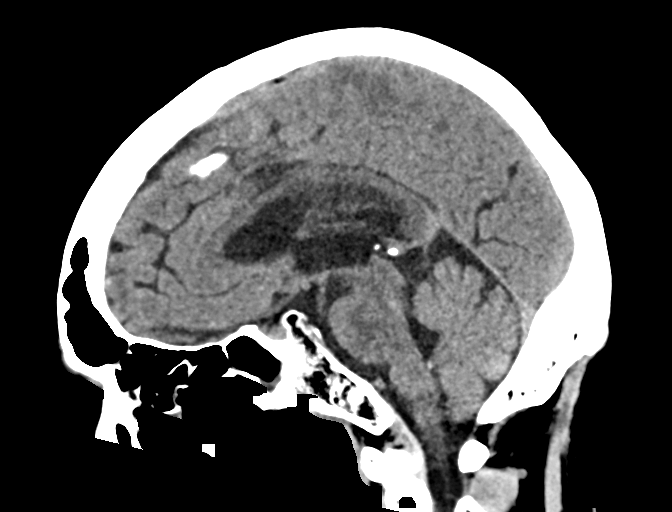
[im 43/65  brain]
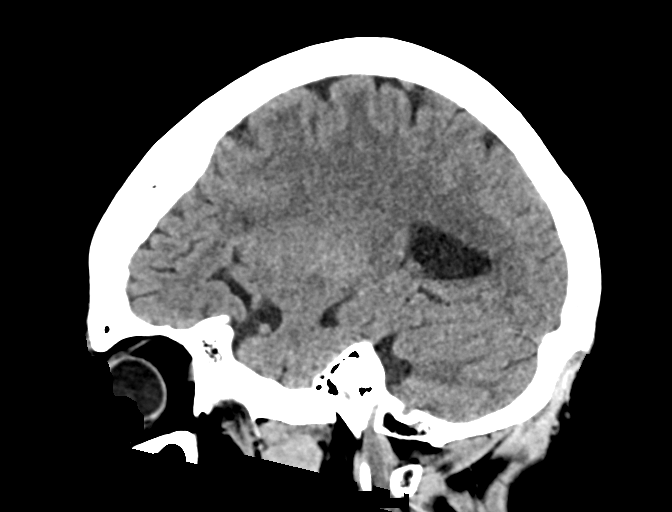

[16 of 47 positions shown; findings below may reference images not displayed]

FINDINGS: Brain: Mild generalized age related parenchymal volume loss with
commensurate dilatation of the ventricles and sulci. Mild chronic
small vessel ischemic changes within the bilateral periventricular
white matter regions.

No mass, hemorrhage, edema or other evidence of acute parenchymal
abnormality. No extra-axial hemorrhage.

Vascular: Chronic calcified atherosclerotic changes of the large
vessels at the skull base. No unexpected hyperdense vessel.

Skull: Normal. Negative for fracture or focal lesion.

Sinuses/Orbits: Fluid/mucosal thickening within the LEFT maxillary
sinus. Mild mucosal thickening within the ethmoid air cells.
Periorbital and retro-orbital soft tissues are unremarkable.

Other: None.
IMPRESSION: 1. No acute findings. No intracranial mass, hemorrhage or edema.
2. Chronic small vessel ischemic changes in the white matter.
3. Paranasal sinus disease, of uncertain chronicity.

## 2020-05-11 IMAGING — DX DG CHEST 2V
2 series · 2 of 2 positions shown · non-contrast
Comparison: Chest x-ray dated [DATE]

CLINICAL DATA: Weakness and memory problems.  Ex-smoker.

EXAM:
CHEST - 2 VIEW

[chest pa]
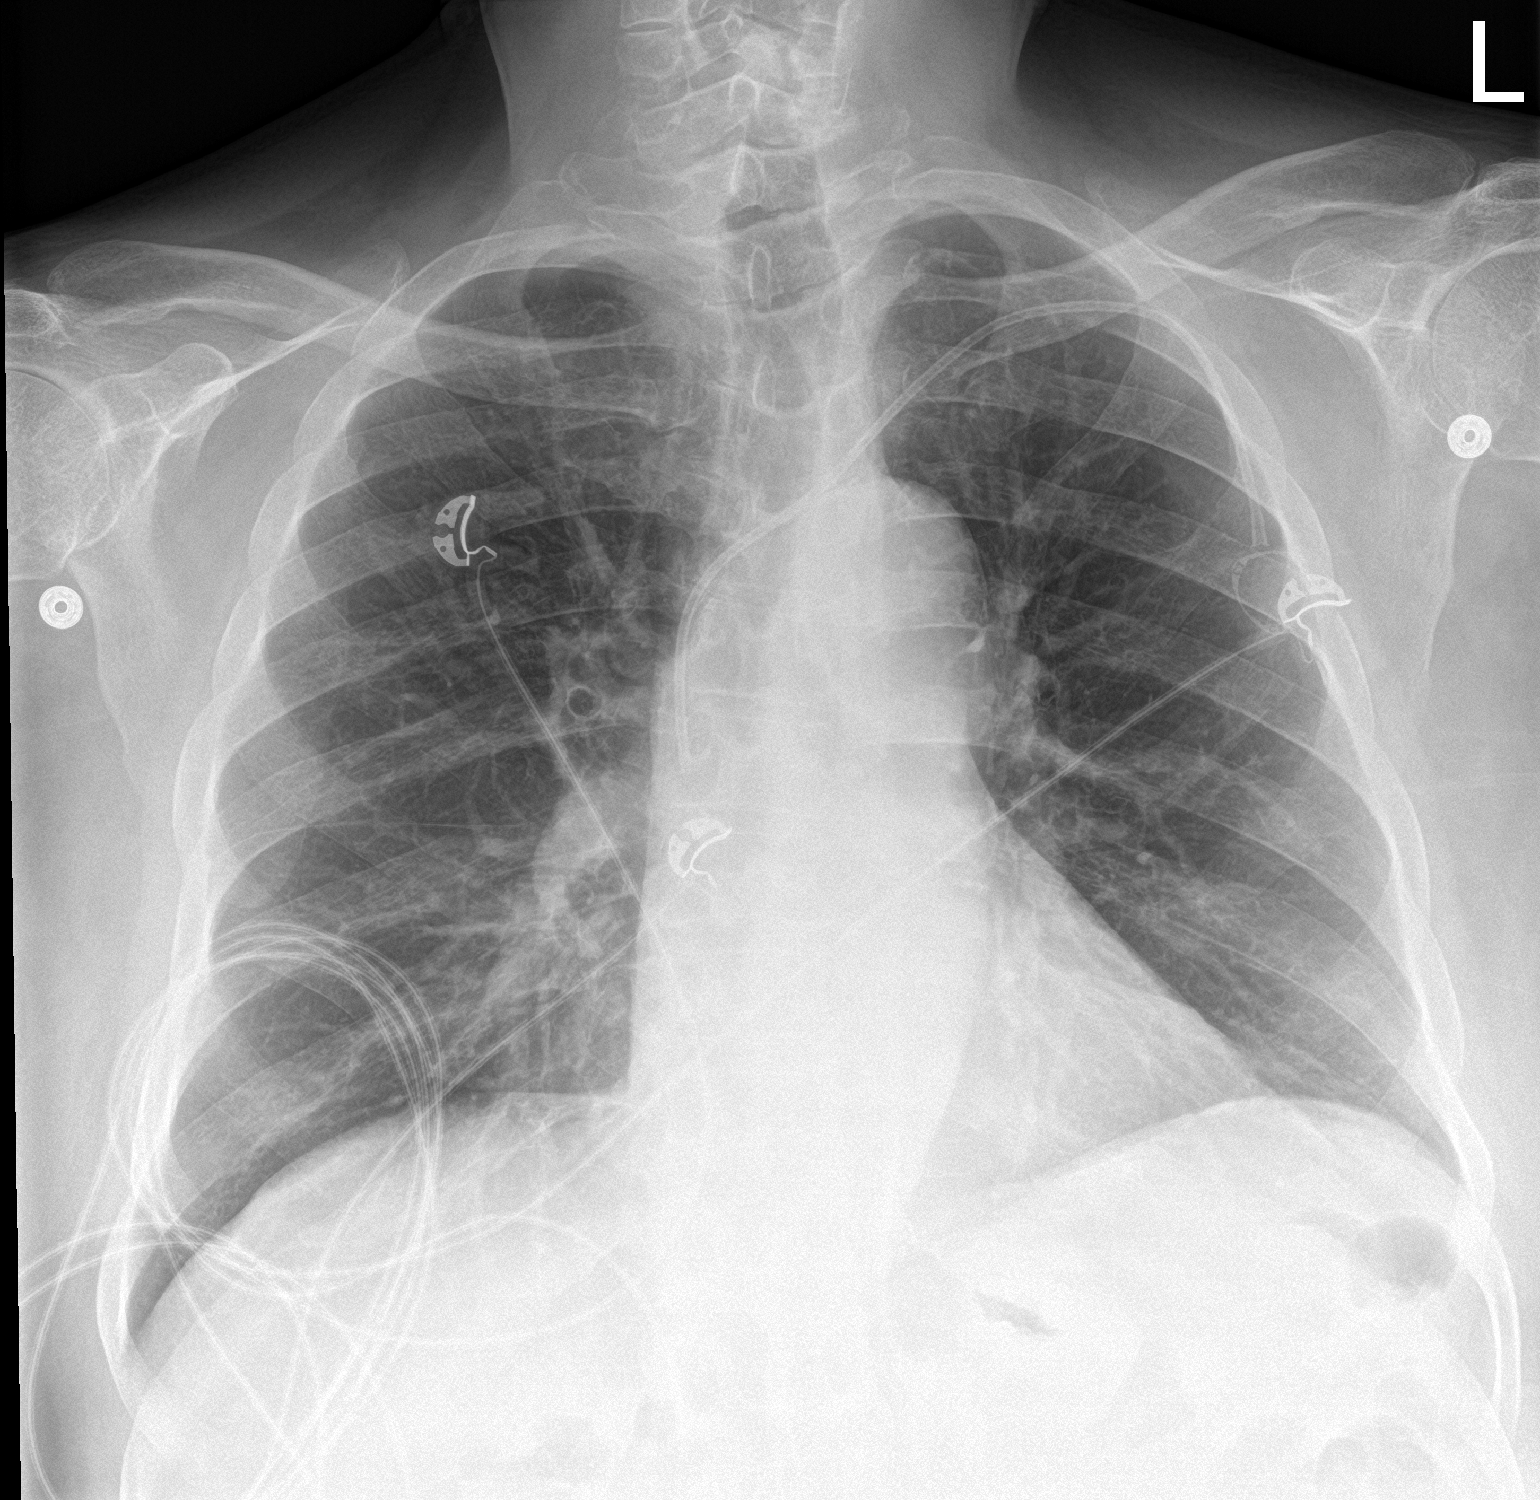

[chest lat]
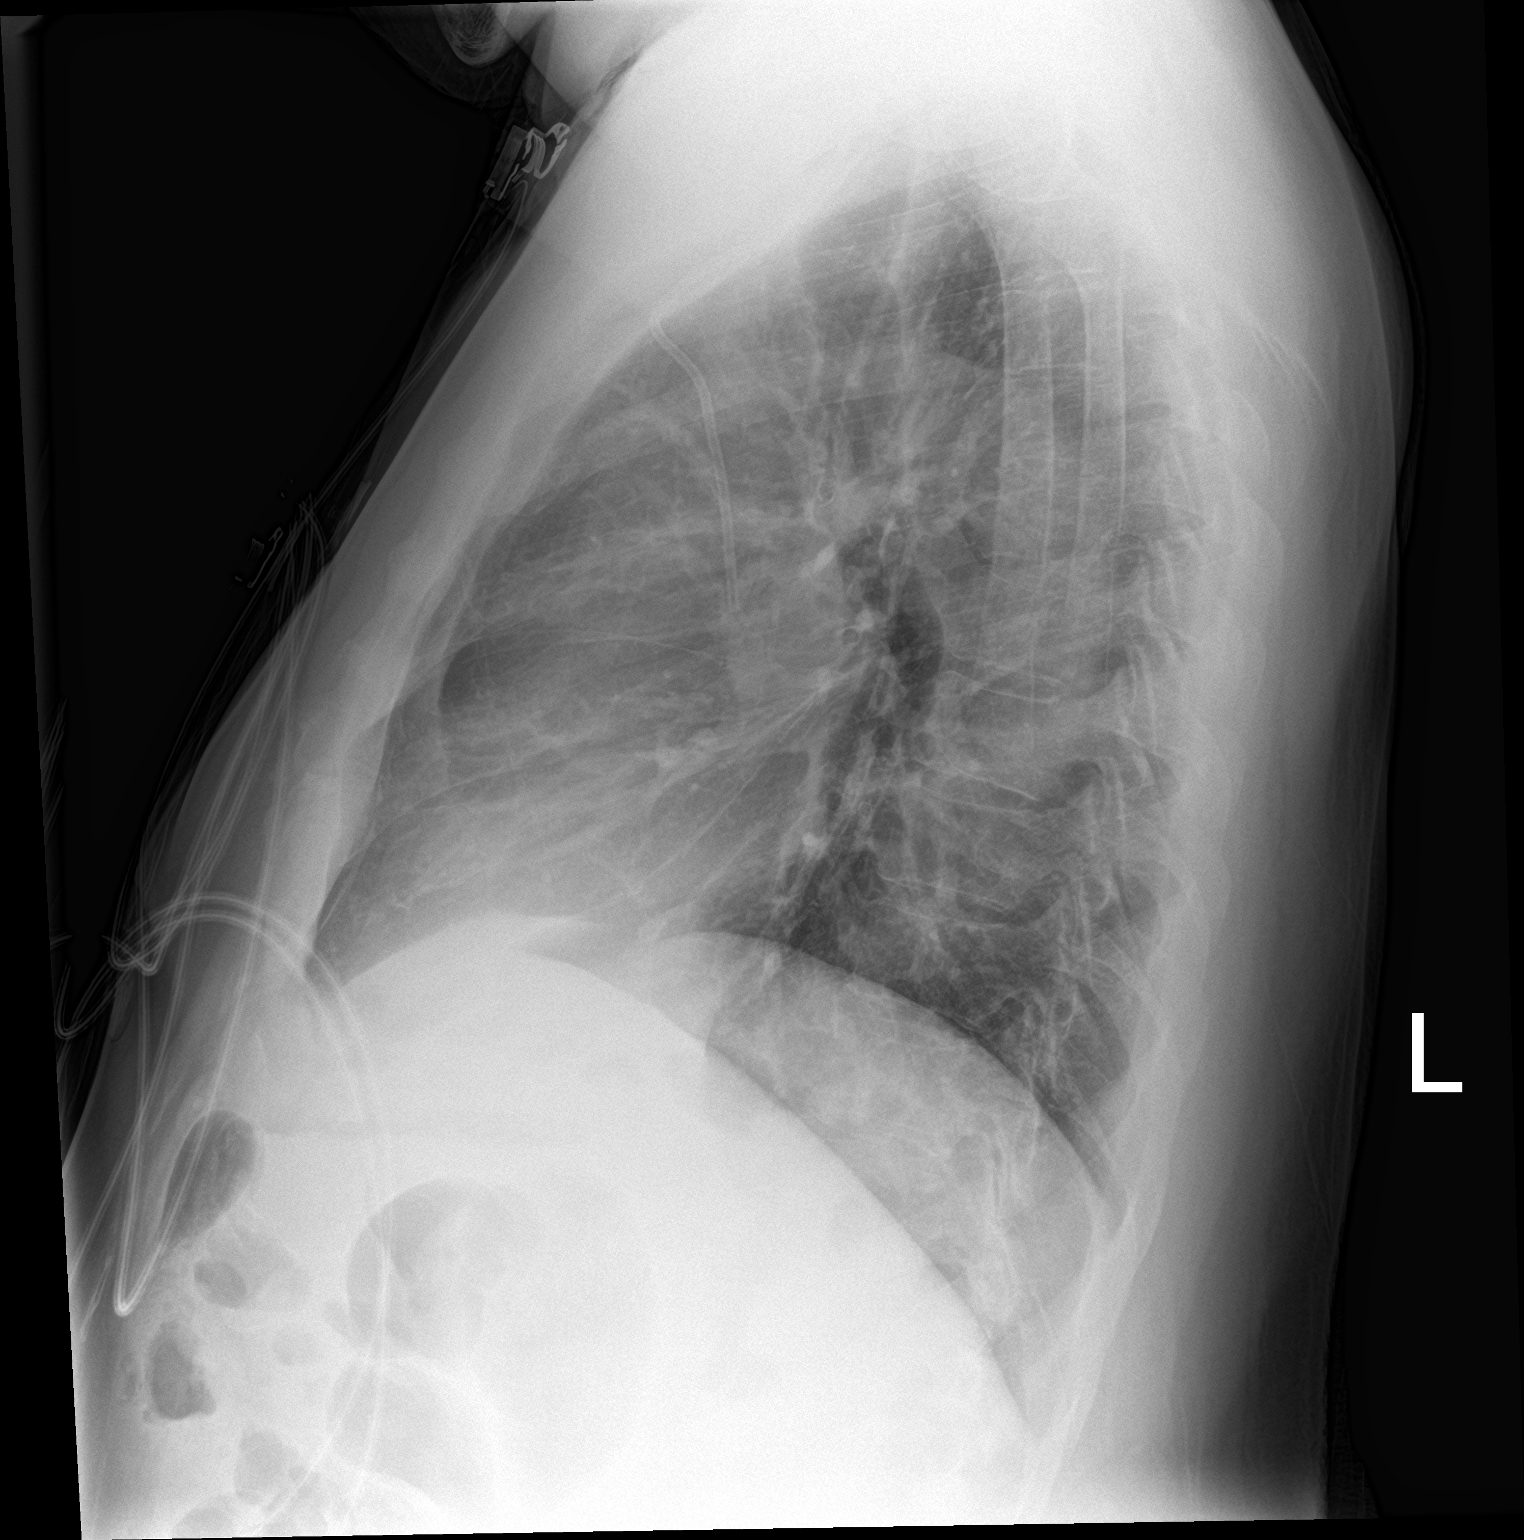

[2 of 2 positions shown; findings below may reference images not displayed]

FINDINGS: Heart size and mediastinal contours are within normal limits. LEFT
chest wall Port-A-Cath appears adequately positioned with tip at the
level of the mid SVC. Lungs are clear. No pleural effusion or
pneumothorax is seen. Osseous structures about the chest are
unremarkable.
IMPRESSION: No active cardiopulmonary disease. No evidence of pneumonia or
pulmonary edema.

## 2020-05-11 MED ORDER — LABETALOL HCL 5 MG/ML IV SOLN
10.0000 mg | Freq: Once | INTRAVENOUS | Status: AC
  Administered 2020-05-11: 10 mg via INTRAVENOUS
  Filled 2020-05-11: qty 4

## 2020-05-11 MED ORDER — SODIUM CHLORIDE 0.9 % IV BOLUS
1000.0000 mL | Freq: Once | INTRAVENOUS | Status: AC
  Administered 2020-05-11: 1000 mL via INTRAVENOUS

## 2020-05-11 NOTE — ED Notes (Signed)
MD notified of BP

## 2020-05-11 NOTE — ED Provider Notes (Signed)
[facility]    CSN: [id number] Arrival date & time: 05/11/20  1217      History   Chief Complaint Chief Complaint  Patient presents with  . Memory issues  . balance issues    HPI Jacob [name] is a 75 y.o. male.   Jacob [name] presents with his daughter with complaints of new onset of confusion, decreased memory, some headaches and dizziness. Noted around 1 week ago. Daughter uses the example that typically he manages his wife's medications, but he just recently wasn't able to remember and organize and figure out the administration, despite having previous regularly done this task. He endorses some mild headache and blurred vision, occasional dizziness. They deny any slurred speech. He did fall in a parking lot 1 month ago onto his left side. He was evaluated at that time, and he did not recall striking his head. He uses a can, and regularly is imbalanced, daughter states she always feels worried he will have a fall. History of htn, hasn't taken his medications today. No chest pain , no shortness of breath. Daughter states he has been eating much less as well. History of gastric adenocarcinoma with gastrectomy.     ROS per HPI, negative if not otherwise mentioned.      Past Medical History:  Diagnosis Date  . Anemia    hx iron deficiency  . Anxiety   . Arthritis    gout  . Back pain   . BPH with obstruction/lower urinary tract symptoms   . Chronic kidney disease    "they said I do"  . Constipation   . Depression   . ED (erectile dysfunction)   . Epigastric pain   . GERD (gastroesophageal reflux disease)   . Heartburn   . History of blood transfusion   . History of radiation therapy 11/03/11-12/29/11   prostate  . Hypercholesterolemia   . Hypertension   . Night sweats   . Oxygen deficiency    2x per week at night for sleep  . Post-operative nausea and vomiting 04/09/2016  . Prostate cancer ([place]) 08/14/11   Adenocarcinoma,gleason:3+3=6,&  3+4=7,PSA=5.66  . PUD (peptic ulcer disease)   . Sleep apnea    does not use Cpap but does use O2 @ 2l   . Stomach cancer ([place])   . Ulcer    peptic ulcer hx    Patient Active Problem List   Diagnosis Date Noted  . Hyperkalemia 12/20/2019  . History of stomach cancer 12/20/2019  . Metabolic acidosis 96/22/2979  . Hypoglycemia due to insulin 12/20/2019  . Hypertensive urgency 12/20/2019  . Personal history of colonic polyps   . Diverticulosis of colon without hemorrhage   . Multiple polyps of sigmoid colon   . AKI (acute kidney injury) ([place]) 09/15/2018  . CKD (chronic kidney disease), stage III ([place]) 09/15/2018  . Pneumonia 09/09/2018  . Acute renal failure superimposed on stage 3 chronic kidney disease ([place]) 09/09/2018  . SBO (small bowel obstruction) ([place]) 09/09/2018  . Port-A-Cath in place 11/23/2017  . Post-operative nausea and vomiting 04/09/2016  . 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 Genetic testing 12/19/2015  . Stomach cancer ([place])   . Gastric adenocarcinoma ([place])   . Gastric mass   . GI bleed 10/26/2015  . Gastrointestinal hemorrhage   . PUD (peptic ulcer disease)   . 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 Nocturnal hypoxemia 11/04/2013  . Noncompliance with treatment 09/02/2013  . Chronic kidney disease, stage II (mild)  08/30/2013  . Morbid obesity ([place]) 07/26/2013  . Fatigue 07/26/2013  . Prostate cancer ([place]) 08/14/2011  . Iron deficiency anemia 03/19/2007  . DEPRESSION 03/19/2007  . Essential hypertension 03/19/2007  . NEOPLASM, BENIGN, STOMACH 11/23/2006  . POLYP, COLON 11/23/2006  . Grade I internal hemorrhoids 11/23/2006  . GASTRITIS 11/23/2006    Past Surgical History:  Procedure Laterality Date  . colon polyps bx  11/24/06   colon,transverse and rectosigmoid polyps:tubular adenomas and hyperplastic polyps,no high grade dysplasia or malignancy   . COLONOSCOPY WITH PROPOFOL N/A 08/24/2019   Procedure: COLONOSCOPY  WITH PROPOFOL;  Surgeon: [name], DO;  Location: [facility];  Service: Gastroenterology;  Laterality: N/A;  . duodenal bx  11/24/06   benign  . ESOPHAGOGASTRODUODENOSCOPY N/A 10/27/2015   Procedure: ESOPHAGOGASTRODUODENOSCOPY (EGD);  Surgeon: [name], MD;  Location: [facility];  Service: Endoscopy;  Laterality: N/A;  . EUS N/A 11/08/2015   Procedure: UPPER ENDOSCOPIC ULTRASOUND (EUS) RADIAL;  Surgeon: [name], MD;  Location: [facility];  Service: Endoscopy;  Laterality: N/A;  . GASTRECTOMY N/A 03/25/2016   Procedure: DISTAL GASTRECTOMY;  Surgeon: [name], MD;  Location: [facility];  Service: General;  Laterality: N/A;  . gastric bx  11/24/06   chronic active gastritis,with metaplasia and focal changaes of xanthelasma  . GASTROSTOMY N/A 03/25/2016   Procedure: INSERTION OF FEEDING TUBE;  Surgeon: [name], MD;  Location: [facility];  Service: General;  Laterality: N/A;  . INSERTION PROSTATE RADIATION SEED  12-29-11  . IR GENERIC HISTORICAL  04/01/2016   IR GJ TUBE CHANGE 04/01/2016 Markus Daft, MD [facility]  . IR GENERIC HISTORICAL  04/05/2016   IR GJ TUBE CHANGE 04/05/2016 Markus Daft, MD [facility]  . LAPAROSCOPIC GASTRECTOMY  03/25/2016   Diagnostic laparoscopy, distal gastrectomy with Billroth 2 reconstruction and gastrojejunostomy tube  . LAPAROSCOPY N/A 03/25/2016   Procedure: DIAGNOSTIC LAPAROSCOPY;  Surgeon: [name], MD;  Location: [facility];  Service: General;  Laterality: N/A;  . POLYPECTOMY  08/24/2019   Procedure: POLYPECTOMY;  Surgeon: [name], DO;  Location: [facility];  Service: Gastroenterology;;  . Sol Passer PLACEMENT Left 12/04/2015   Procedure: INSERTION PORT-A-CATH;  Surgeon: [name], MD;  Location: [facility];  Service: General;  Laterality: Left;  . PROSTATE BIOPSY  08/14/11   Adenocarcinoma/volume=58.51cc,gleason=3+3=6 & 3+4=7  . TONSILLECTOMY     75 years old       Home Medications    Prior to Admission medications    Medication Sig Start Date End Date Taking? Authorizing Provider  acetaminophen (TYLENOL) 500 MG tablet Take 1,000 mg by mouth daily as needed for moderate pain or headache.   Yes [provider]  allopurinol (ZYLOPRIM) 300 MG tablet Take 300 mg by mouth daily. 04/15/19  Yes [provider]  buPROPion (WELLBUTRIN XL) 300 MG 24 hr tablet Take 300 mg by mouth daily.  05/10/15  Yes [provider]  calcium carbonate (TUMS - DOSED IN MG ELEMENTAL CALCIUM) 500 MG chewable tablet Chew 3 tablets by mouth 2 (two) times daily as needed for indigestion or heartburn.   Yes [provider]  diclofenac Sodium (VOLTAREN) 1 % GEL Apply 1 application topically 2 (two) times daily as needed (pain).  02/19/19  Yes [provider]  furosemide (LASIX) 20 MG tablet Take 20 mg by mouth daily.    Yes [provider]  gabapentin (NEURONTIN) 400 MG capsule Take 400 mg by mouth.   Yes [provider]  hydrALAZINE (APRESOLINE)  25 MG tablet Take 25 mg by mouth 3 (three) times daily.  08/05/18  Yes [provider]  LOKELMA 5 g packet TAKE 5 GRAMS BY MOUTH TWO TIMES A WEEK 02/13/20  Yes Fayrene Helper, MD  Melatonin 10 MG TABS Take 10 mg by mouth at bedtime as needed (sleep).    Yes [provider]  Nebivolol HCl 20 MG TABS Take 20 mg by mouth daily.   Yes [provider]  polycarbophil (FIBERCON) 625 MG tablet Take 1 tablet (625 mg total) by mouth daily. 04/21/16  Yes [name], MD  polyethylene glycol (MIRALAX / GLYCOLAX) 17 g packet Take 17 g by mouth 2 (two) times daily. 09/18/18  Yes Kayleen Memos, DO  tamsulosin (FLOMAX) 0.4 MG CAPS capsule Take 1 capsule (0.4 mg total) by mouth daily. 01/24/20  Yes McKenzie, Candee Furbish, MD  venlafaxine (EFFEXOR) 75 MG tablet Take 75 mg by mouth 2 (two) times daily.   Yes [provider]  cetirizine (ZYRTEC) 10 MG tablet Take 10 mg by mouth daily.    [provider]  pantoprazole  (PROTONIX) 40 MG tablet Take 1 tablet (40 mg total) by mouth daily before supper. Patient not taking: Reported on 12/16/2019 09/18/18   Kayleen Memos, DO  prochlorperazine (COMPAZINE) 10 MG tablet TAKE ONE TABLET BY MOUTH EVERY 6 HOURS AS NEEDED FOR NAUSSEA AND VOMITING Patient taking differently: Take 10 mg by mouth every 6 (six) hours as needed for nausea or vomiting. 12/13/19   Wyatt Portela, MD  sodium bicarbonate 650 MG tablet Take 650 mg by mouth 2 (two) times daily.    [provider]  verapamil (CALAN) 80 MG tablet Take 80 mg by mouth 3 (three) times daily.     [provider]    Family History Family History  Problem Relation Age of Onset  . Cancer Mother        NOS  . Alcohol abuse Father   . Alcohol abuse Brother 50  . Cancer Paternal Aunt        NOS  . Colon cancer Neg Hx     Social History Social History   Tobacco Use  . Smoking status: Former Smoker    Packs/day: 1.50    Years: 30.00    Pack years: 45.00    Types: Cigarettes    Quit date: 04/13/2000    Years since quitting: 20.0  . Smokeless tobacco: Never Used  Vaping Use  . Vaping Use: Never used  Substance Use Topics  . Alcohol use: Not Currently    Alcohol/week: 1.0 standard drink    Types: 1 Cans of beer per week    Comment: a beer about once a week, less than in his youth  . Drug use: No     Allergies   Patient has no known allergies.   Review of Systems Review of Systems   Physical Exam Triage Vital Signs ED Triage Vitals  Enc Vitals Group     BP 05/11/20 1305 (!) 171/108     Pulse Rate 05/11/20 1305 84     Resp 05/11/20 1305 16     Temp 05/11/20 1305 97.8 F (36.6 C)     Temp src --      SpO2 05/11/20 1305 100 %     Weight --      Height --      Head Circumference --      Peak Flow --      Pain  Score 05/11/20 1300 0     Pain Loc --      Pain Edu? --      Excl. in [place]? --    No data found.  Updated Vital Signs BP (!) 171/108 Comment: pt reports has not yet  taken BP meds today  Pulse 84   Temp 97.8 F (36.6 C)   Resp 16   SpO2 100%   Visual Acuity Right Eye Distance:   Left Eye Distance:   Bilateral Distance:    Right Eye Near:   Left Eye Near:    Bilateral Near:     Physical Exam Constitutional:      Appearance: He is well-developed.  HENT:     Head: Normocephalic and atraumatic.     Mouth/Throat:     Mouth: Mucous membranes are moist.  Eyes:     Extraocular Movements: Extraocular movements intact.  Cardiovascular:     Rate and Rhythm: Normal rate.  Pulmonary:     Effort: Pulmonary effort is normal.  Skin:    General: Skin is warm and dry.  Neurological:     Mental Status: He is alert and oriented to person, place, and time.     Cranial Nerves: No cranial nerve deficit.     Sensory: No sensory deficit.     Motor: No weakness.     Coordination: Coordination normal.     Comments: Using cane for ambulation; strength equal bilaterally and gross sensation intact to upper and lower extremities; negative for pronator drift       UC Treatments / Results  Labs (all labs ordered are listed, but only abnormal results are displayed) Labs Reviewed - No data to display  EKG   Radiology No results found.  Procedures Procedures (including critical care time)  Medications Ordered in UC Medications - No data to display  Initial Impression / Assessment and Plan / UC Course  I have reviewed the triage vital signs and the nursing notes.  Pertinent labs & imaging results that were available during my care of the patient were reviewed by me and considered in my medical decision making (see chart for details).     1 week of new mental status changes, per daughter, and with patient in agreement. No known head injury. Hypertensive here today, although this appears consistent with previous BP's, states he hasn't taken his medication yet today. No hemiparesis or facial droop noted here today. cva vs metabolic vs uti source of  symptoms? Recommend more thorough evaluation in the ER now. Patient and daughter verbalized understanding and agreeable to plan, she will transport him there now.   Final Clinical Impressions(s) / UC Diagnoses   Final diagnoses:  Memory changes  Dizziness  Poor balance  History of recent fall  Hypertension, unspecified type     Discharge Instructions     Because of the symptoms and recent changes Ringo has been experiencing I do feel he should seek further and more thorough evaluation in the ER.    ED Prescriptions    None     PDMP not reviewed this encounter.   Zigmund Gottron, NP 05/11/20 1419

## 2020-05-11 NOTE — ED Triage Notes (Signed)
Pt has been having memory issues and balance issues for about one week. Pt also reports a decreased appetite. Pt daughter states that pt has been having decreased ability to perform his typical daily tasks. Daughter states pt is seen at an [facility] facility but does not feel they have been able to help.  Pt has had several falls recently. Daughter reports pt only sustained abrasions from these falls.

## 2020-05-11 NOTE — Discharge Instructions (Addendum)
Take your normal evening blood pressure meds when you get home.  You need to speak with your doctor regarding your meds.  Make sure you are taking the correct meds.

## 2020-05-11 NOTE — ED Notes (Signed)
Patient transported to MRI 

## 2020-05-11 NOTE — ED Provider Notes (Signed)
[facility] EMERGENCY DEPARTMENT Provider Note   CSN: [id number] Arrival date & time: 05/11/20  1425     History Chief Complaint  Patient presents with  . Memory Loss    Jacob [name] is a 75 y.o. male.  Pt presents to the ED today with memory issues.  Pt's daughter gives most of the history.  She said he is normally very mentally sharp.  He is the primary caregiver for his wife who is on several different meds, including insulin.  For the past week, he's been unable to figure out how to give the insulin.  He said he feels very foggy headed.  He denies any pain.  No fevers.  He did not take any of his meds today.  He has not had an appetite.  He has not been drinking much.  He is very unsteady on his feet for the past month.  He did fall in a parking lot about a month ago, but did not hit his head.  He is not on blood thinners.   Pt initially went to UC and was sent here.        Past Medical History:  Diagnosis Date  . Anemia    hx iron deficiency  . Anxiety   . Arthritis    gout  . Back pain   . BPH with obstruction/lower urinary tract symptoms   . Chronic kidney disease    "they said I do"  . Constipation   . Depression   . ED (erectile dysfunction)   . Epigastric pain   . GERD (gastroesophageal reflux disease)   . Heartburn   . History of blood transfusion   . History of radiation therapy 11/03/11-12/29/11   prostate  . Hypercholesterolemia   . Hypertension   . Night sweats   . Oxygen deficiency    2x per week at night for sleep  . Post-operative nausea and vomiting 04/09/2016  . Prostate cancer ([place]) 08/14/11   Adenocarcinoma,gleason:3+3=6,& 3+4=7,PSA=5.66  . PUD (peptic ulcer disease)   . Sleep apnea    does not use Cpap but does use O2 @ 2l   . Stomach cancer ([place])   . Ulcer    peptic ulcer hx    Patient Active Problem List   Diagnosis Date Noted  . Hyperkalemia 12/20/2019  . History of stomach cancer 12/20/2019  . Metabolic  acidosis 42/59/5638  . Hypoglycemia due to insulin 12/20/2019  . Hypertensive urgency 12/20/2019  . Personal history of colonic polyps   . Diverticulosis of colon without hemorrhage   . Multiple polyps of sigmoid colon   . AKI (acute kidney injury) ([place]) 09/15/2018  . CKD (chronic kidney disease), stage III ([place]) 09/15/2018  . Pneumonia 09/09/2018  . Acute renal failure superimposed on stage 3 chronic kidney disease ([place]) 09/09/2018  . SBO (small bowel obstruction) ([place]) 09/09/2018  . Port-A-Cath in place 11/23/2017  . Post-operative nausea and vomiting 04/09/2016  . 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 Genetic testing 12/19/2015  . Stomach cancer ([place])   . Gastric adenocarcinoma ([place])   . Gastric mass   . GI bleed 10/26/2015  . Gastrointestinal hemorrhage   . PUD (peptic ulcer disease)   . 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 Nocturnal hypoxemia 11/04/2013  . Noncompliance with treatment 09/02/2013  . Chronic kidney disease, stage II (mild) 08/30/2013  . Morbid obesity ([place]) 07/26/2013  . Fatigue 07/26/2013  . Prostate cancer ([place]) 08/14/2011  . Iron deficiency  anemia 03/19/2007  . DEPRESSION 03/19/2007  . Essential hypertension 03/19/2007  . NEOPLASM, BENIGN, STOMACH 11/23/2006  . POLYP, COLON 11/23/2006  . Grade I internal hemorrhoids 11/23/2006  . GASTRITIS 11/23/2006    Past Surgical History:  Procedure Laterality Date  . colon polyps bx  11/24/06   colon,transverse and rectosigmoid polyps:tubular adenomas and hyperplastic polyps,no high grade dysplasia or malignancy   . COLONOSCOPY WITH PROPOFOL N/A 08/24/2019   Procedure: COLONOSCOPY WITH PROPOFOL;  Surgeon: [name], DO;  Location: [facility];  Service: Gastroenterology;  Laterality: N/A;  . duodenal bx  11/24/06   benign  . ESOPHAGOGASTRODUODENOSCOPY N/A 10/27/2015   Procedure: ESOPHAGOGASTRODUODENOSCOPY (EGD);  Surgeon: [name], MD;  Location: [facility];   Service: Endoscopy;  Laterality: N/A;  . EUS N/A 11/08/2015   Procedure: UPPER ENDOSCOPIC ULTRASOUND (EUS) RADIAL;  Surgeon: [name], MD;  Location: [facility];  Service: Endoscopy;  Laterality: N/A;  . GASTRECTOMY N/A 03/25/2016   Procedure: DISTAL GASTRECTOMY;  Surgeon: [name], MD;  Location: [facility];  Service: General;  Laterality: N/A;  . gastric bx  11/24/06   chronic active gastritis,with metaplasia and focal changaes of xanthelasma  . GASTROSTOMY N/A 03/25/2016   Procedure: INSERTION OF FEEDING TUBE;  Surgeon: [name], MD;  Location: [facility];  Service: General;  Laterality: N/A;  . INSERTION PROSTATE RADIATION SEED  12-29-11  . IR GENERIC HISTORICAL  04/01/2016   IR GJ TUBE CHANGE 04/01/2016 Markus Daft, MD [facility]  . IR GENERIC HISTORICAL  04/05/2016   IR GJ TUBE CHANGE 04/05/2016 Markus Daft, MD [facility]  . LAPAROSCOPIC GASTRECTOMY  03/25/2016   Diagnostic laparoscopy, distal gastrectomy with Billroth 2 reconstruction and gastrojejunostomy tube  . LAPAROSCOPY N/A 03/25/2016   Procedure: DIAGNOSTIC LAPAROSCOPY;  Surgeon: [name], MD;  Location: [facility];  Service: General;  Laterality: N/A;  . POLYPECTOMY  08/24/2019   Procedure: POLYPECTOMY;  Surgeon: [name], DO;  Location: [facility];  Service: Gastroenterology;;  . Sol Passer PLACEMENT Left 12/04/2015   Procedure: INSERTION PORT-A-CATH;  Surgeon: [name], MD;  Location: [facility];  Service: General;  Laterality: Left;  . PROSTATE BIOPSY  08/14/11   Adenocarcinoma/volume=58.51cc,gleason=3+3=6 & 3+4=7  . TONSILLECTOMY     75 years old       Family History  Problem Relation Age of Onset  . Cancer Mother        NOS  . Alcohol abuse Father   . Alcohol abuse Brother 50  . Cancer Paternal Aunt        NOS  . Colon cancer Neg Hx     Social History   Tobacco Use  . Smoking status: Former Smoker    Packs/day: 1.50    Years: 30.00    Pack years: 45.00    Types: Cigarettes    Quit date:  04/13/2000    Years since quitting: 20.0  . Smokeless tobacco: Never Used  Vaping Use  . Vaping Use: Never used  Substance Use Topics  . Alcohol use: Not Currently    Alcohol/week: 1.0 standard drink    Types: 1 Cans of beer per week    Comment: a beer about once a week, less than in his youth  . Drug use: No    Home Medications Prior to Admission medications   Medication Sig Start Date End Date Taking? Authorizing Provider  acetaminophen (TYLENOL) 500 MG tablet Take 1,000 mg by mouth daily as needed for moderate pain or headache.  [provider]  allopurinol (ZYLOPRIM) 300 MG tablet Take 300 mg by mouth daily. 04/15/19   [provider]  buPROPion (WELLBUTRIN XL) 300 MG 24 hr tablet Take 300 mg by mouth daily.  05/10/15   [provider]  busPIRone (BUSPAR) 10 MG tablet Take 10 mg by mouth.    [provider]  calcium carbonate (TUMS - DOSED IN MG ELEMENTAL CALCIUM) 500 MG chewable tablet Chew 3 tablets by mouth 2 (two) times daily as needed for indigestion or heartburn.    [provider]  cetirizine (ZYRTEC) 10 MG tablet Take 10 mg by mouth daily.    [provider]  diclofenac Sodium (VOLTAREN) 1 % GEL Apply 1 application topically 2 (two) times daily as needed (pain).  02/19/19   [provider]  furosemide (LASIX) 20 MG tablet Take 20 mg by mouth daily.     [provider]  gabapentin (NEURONTIN) 400 MG capsule Take 400 mg by mouth.    [provider]  hydrALAZINE (APRESOLINE) 25 MG tablet Take 25 mg by mouth 3 (three) times daily.  08/05/18   [provider]  levothyroxine (SYNTHROID) 112 MCG tablet Take 112 mcg by mouth daily before breakfast.    [provider]  LOKELMA 5 g packet TAKE 5 GRAMS BY MOUTH TWO TIMES A WEEK Patient taking differently: Take 5 g by mouth 2 (two) times a week. 02/13/20   Fayrene Helper, MD  melatonin 3 MG TABS tablet Take 9 mg by mouth at bedtime.     [provider]  Nebivolol HCl 20 MG TABS Take 20 mg by mouth daily.    [provider]  pantoprazole (PROTONIX) 40 MG tablet Take 1 tablet (40 mg total) by mouth daily before supper. Patient not taking: No sig reported 09/18/18   Kayleen Memos, DO  polycarbophil (FIBERCON) 625 MG tablet Take 1 tablet (625 mg total) by mouth daily. 04/21/16   [name], MD  polyethylene glycol (MIRALAX / GLYCOLAX) 17 g packet Take 17 g by mouth 2 (two) times daily. 09/18/18   Kayleen Memos, DO  prochlorperazine (COMPAZINE) 10 MG tablet TAKE ONE TABLET BY MOUTH EVERY 6 HOURS AS NEEDED FOR NAUSSEA AND VOMITING Patient taking differently: Take 10 mg by mouth every 6 (six) hours as needed for nausea or vomiting. 12/13/19   Wyatt Portela, MD  sodium bicarbonate 650 MG tablet Take 650 mg by mouth 2 (two) times daily.    [provider]  sodium zirconium cyclosilicate (LOKELMA) 10 g PACK packet Take 10 g by mouth.    [provider]  tamsulosin (FLOMAX) 0.4 MG CAPS capsule Take 1 capsule (0.4 mg total) by mouth daily. 01/24/20   McKenzie, Candee Furbish, MD  venlafaxine XR (EFFEXOR-XR) 150 MG 24 hr capsule Take 150 mg by mouth every morning. For depression/anxiety 02/29/20   [provider]  verapamil (CALAN) 80 MG tablet Take 80 mg by mouth 3 (three) times daily.     [provider]    Allergies    Lisinopril  Review of Systems   Review of Systems  Constitutional: Positive for appetite change.  Neurological: Positive for weakness.       Memory problems Unsteady walking  All other systems reviewed and are negative.   Physical Exam Updated Vital Signs BP (!) 203/112   Pulse 74   Temp 97.8 F (36.6 C) (Oral)   Resp 18   Ht 6\' 2"  (1.88 m)  Wt 103.4 kg   SpO2 99%   BMI 29.27 kg/m   Physical Exam Vitals and nursing note reviewed.  Constitutional:      Appearance: Normal appearance.  HENT:     Head: Normocephalic and atraumatic.     Right Ear:  External ear normal.     Left Ear: External ear normal.     Nose: Nose normal.     Mouth/Throat:     Mouth: Mucous membranes are dry.  Eyes:     Extraocular Movements: Extraocular movements intact.     Conjunctiva/sclera: Conjunctivae normal.     Pupils: Pupils are equal, round, and reactive to light.  Cardiovascular:     Rate and Rhythm: Normal rate and regular rhythm.     Pulses: Normal pulses.     Heart sounds: Normal heart sounds.  Pulmonary:     Effort: Pulmonary effort is normal.     Breath sounds: Normal breath sounds.  Abdominal:     General: Abdomen is flat. Bowel sounds are normal.     Palpations: Abdomen is soft.  Musculoskeletal:        General: Normal range of motion.     Cervical back: Normal range of motion and neck supple.  Skin:    General: Skin is warm.     Capillary Refill: Capillary refill takes less than 2 seconds.  Neurological:     General: No focal deficit present.     Mental Status: He is alert and oriented to person, place, and time.  Psychiatric:        Mood and Affect: Mood normal.        Behavior: Behavior normal.        Thought Content: Thought content normal.        Judgment: Judgment normal.     ED Results / Procedures / Treatments   Labs (all labs ordered are listed, but only abnormal results are displayed) Labs Reviewed  CBC - Abnormal; Notable for the following components:      Result Value   RBC 3.16 (*)    Hemoglobin 9.9 (*)    HCT 31.5 (*)    All other components within normal limits  COMPREHENSIVE METABOLIC PANEL - Abnormal; Notable for the following components:   Sodium 134 (*)    CO2 20 (*)    BUN 26 (*)    Creatinine, Ser 2.73 (*)    GFR, Estimated 24 (*)    All other components within normal limits  URINALYSIS, ROUTINE W REFLEX MICROSCOPIC - Abnormal; Notable for the following components:   Protein, ur 100 (*)    All other components within normal limits  RESP PANEL BY RT-PCR (FLU A&B, COVID) ARPGX2  PROTIME-INR   APTT  DIFFERENTIAL  RAPID URINE DRUG SCREEN, HOSP PERFORMED  ETHANOL  CBG MONITORING, ED    EKG EKG Interpretation  Date/Time:  Friday May 11 2020 14:39:31 EDT Ventricular Rate:  81 PR Interval:  176 QRS Duration: 78 QT Interval:  376 QTC Calculation: 436 R Axis:   20 Text Interpretation: Normal sinus rhythm Minimal voltage criteria for LVH, may be normal variant ( R in aVL ) Nonspecific ST and T wave abnormality Abnormal ECG No significant change since last tracing Confirmed by Isla Pence [phone]) on 05/11/2020 4:13:33 PM   Radiology DG Chest 2 View  Result Date: 05/11/2020 CLINICAL DATA:  Weakness and memory problems.  Ex-smoker. EXAM: CHEST - 2 VIEW COMPARISON:  Chest x-ray dated 12/20/2019 FINDINGS: Heart size and mediastinal contours are  within normal limits. LEFT chest wall Port-A-Cath appears adequately positioned with tip at the level of the mid SVC. Lungs are clear. No pleural effusion or pneumothorax is seen. Osseous structures about the chest are unremarkable. IMPRESSION: No active cardiopulmonary disease. No evidence of pneumonia or pulmonary edema. Electronically Signed   By: Franki Cabot M.D.   On: 05/11/2020 17:55   CT Head Wo Contrast  Result Date: 05/11/2020 CLINICAL DATA:  Status change, confusion. EXAM: CT HEAD WITHOUT CONTRAST TECHNIQUE: Contiguous axial images were obtained from the base of the skull through the vertex without intravenous contrast. COMPARISON:  Head CT dated 08/09/2004 FINDINGS: Brain: Mild generalized age related parenchymal volume loss with commensurate dilatation of the ventricles and sulci. Mild chronic small vessel ischemic changes within the bilateral periventricular white matter regions. No mass, hemorrhage, edema or other evidence of acute parenchymal abnormality. No extra-axial hemorrhage. Vascular: Chronic calcified atherosclerotic changes of the large vessels at the skull base. No unexpected hyperdense vessel. Skull: Normal. Negative  for fracture or focal lesion. Sinuses/Orbits: Fluid/mucosal thickening within the LEFT maxillary sinus. Mild mucosal thickening within the ethmoid air cells. Periorbital and retro-orbital soft tissues are unremarkable. Other: None. IMPRESSION: 1. No acute findings. No intracranial mass, hemorrhage or edema. 2. Chronic small vessel ischemic changes in the white matter. 3. Paranasal sinus disease, of uncertain chronicity. Electronically Signed   By: Franki Cabot M.D.   On: 05/11/2020 17:57   MR BRAIN WO CONTRAST  Result Date: 05/11/2020 CLINICAL DATA:  Memory and balance disturbance over the last week. EXAM: MRI HEAD WITHOUT CONTRAST TECHNIQUE: Multiplanar, multiecho pulse sequences of the brain and surrounding structures were obtained without intravenous contrast. COMPARISON:  Head CT same day FINDINGS: Brain: Diffusion imaging does not show any acute or subacute infarction. Chronic small-vessel ischemic changes affect the pons. No focal cerebellar finding. Cerebral hemispheres show generalized atrophy with moderate chronic small-vessel ischemic changes of the deep and subcortical white matter. No cortical or large vessel territory infarction. No mass lesion, hemorrhage, hydrocephalus or extra-axial collection. Vascular: Major vessels at the base of the brain show flow. Skull and upper cervical spine: Negative Sinuses/Orbits: Left maxillary rhinosinusitis. Scattered opacified ethmoid air cells. Orbits appear normal. Other: None IMPRESSION: 1. No acute or reversible finding. Generalized atrophy with moderate chronic small-vessel ischemic changes of the pons and cerebral hemispheric white matter. 2. Left maxillary rhinosinusitis. Electronically Signed   By: Nelson Chimes M.D.   On: 05/11/2020 20:33    Procedures Procedures   Medications Ordered in ED Medications  sodium chloride 0.9 % bolus 1,000 mL (1,000 mLs Intravenous New Bag/Given 05/11/20 1658)  labetalol (NORMODYNE) injection 10 mg (10 mg Intravenous  Given 05/11/20 1701)    ED Course  I have reviewed the triage vital signs and the nursing notes.  Pertinent labs & imaging results that were available during my care of the patient were reviewed by me and considered in my medical decision making (see chart for details).    MDM Rules/Calculators/A&P                         The pharmacy tech tried to make sense of what medication he is supposed to be taking.  She said some of his wife's meds were mixed in with his and it is unclear if he's actually been taking his meds.  The pt's daughter said her brother is going through their mom's meds to see what she is supposed to be on. Pt's bp  is still elevated, but he has not taken any of his meds today.  He is instructed to take his evening meds.  Pt's daughter is instructed to call his and her mom's pcp's office on Monday to verify what medications they are supposed to be taking.  I recommended that she and her brother make a week supply of meds in a pill box.    Pt's work up does not reveal anything acute.  I will refer pt to neurology.  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    I ordered home health/pt for pt as he's been unsteady on his feet.  Pt is stable for d/c.  Return if worse.   Final Clinical Impression(s) / ED Diagnoses Final diagnoses:  Memory loss  Hypertension, unspecified type    Rx / DC Orders ED Discharge Orders         Ordered    Ambulatory referral to Neurology       Comments: An appointment is requested in approximately: 1 week   05/11/20 2127           Isla Pence, MD 05/11/20 2150

## 2020-05-11 NOTE — ED Triage Notes (Signed)
Emergency Medicine Provider Triage Evaluation Note  Jacob [name] , a 75 y.o. male  was evaluated in triage.  Pt complains of memory loss, balance issues, erratic blood pressures. 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   Review of Systems  Positive: Memory loss, balance issues Negative: fever  Physical Exam  BP (!) 179/119 (BP Location: Right Arm)   Pulse 80   Temp 97.8 F (36.6 C) (Oral)   Resp 16   SpO2 100%  Gen:   Awake, no distress   HEENT:  Atraumatic  Resp:  Normal effort  Cardiac:  Normal rate  Abd:   Nondistended MSK:   Moves extremities without difficulty, no facial droop, cranial nerves II-XII intact, normal finger to nose bilat Neuro:  Speech clear   Medical Decision Making  Medically screening exam initiated at 2:48 PM.  Appropriate orders placed.  Jacob [name] was informed that the remainder of the evaluation will be completed by another provider, this initial triage assessment does not replace that evaluation, and the importance of remaining in the ED until their evaluation is complete.  Clinical Impression   Not a tpa candidate, van neg. stroke w/u ordered.   MSE was initiated and I personally evaluated the patient and placed orders (if any) at  2:48 PM on May 11, 2020.  The patient appears stable so that the remainder of the MSE may be completed by another provider.    Rodney Booze, [place] 05/11/20 1448

## 2020-05-11 NOTE — Discharge Instructions (Signed)
Because of the symptoms and recent changes Jacob [name] has been experiencing I do feel he should seek further and more thorough evaluation in the ER.

## 2020-05-11 NOTE — Discharge Planning (Signed)
CSW met with Pt and wife at bedside.  Pt reports that he does not need any DME.  TOC RNCM will reach out tomorrow to set up [facility] services.

## 2020-05-11 NOTE — ED Triage Notes (Signed)
Pt from home with complaints of memory issues and balance issues x 1 week. Pt here with daughter who states he normally takes care of his wife and is not confused at baseline. He has been unable to do certain daily tasks

## 2020-05-11 NOTE — ED Notes (Signed)
Patient transported to CT 

## 2020-05-11 NOTE — ED Notes (Signed)
Patient is being discharged from the Urgent Care and sent to the Emergency Department via POV with family member. Per Rondel Oh, NP, patient is in need of higher level of care due to memory and balance changes. Patient is aware and verbalizes understanding of plan of care.  Vitals:   05/11/20 1305  BP: (!) 171/108  Pulse: 84  Resp: 16  Temp: 97.8 F (36.6 C)  SpO2: 100%

## 2020-05-12 ENCOUNTER — Telehealth: Payer: Self-pay

## 2020-05-12 NOTE — Telephone Encounter (Signed)
Called for home health referrals to [facility] and [facility], awaiting return call to set up [facility] services

## 2020-05-12 NOTE — Telephone Encounter (Signed)
Referral for [facility] made to [facility] spoke to office they will send out to on call RN

## 2020-05-13 ENCOUNTER — Telehealth: Payer: Self-pay

## 2020-05-13 NOTE — Telephone Encounter (Signed)
Called patient to let him know that Jacob [name] has accepted for home health. Left confidentia lmessage on preferred phone.

## 2020-05-17 ENCOUNTER — Encounter: Payer: Self-pay | Admitting: Podiatry

## 2020-05-17 ENCOUNTER — Telehealth: Payer: Self-pay | Admitting: Podiatry

## 2020-05-17 ENCOUNTER — Ambulatory Visit: Payer: Medicare Other | Admitting: Podiatry

## 2020-05-17 ENCOUNTER — Other Ambulatory Visit: Payer: Self-pay

## 2020-05-17 DIAGNOSIS — S91209A Unspecified open wound of unspecified toe(s) with damage to nail, initial encounter: Secondary | ICD-10-CM | POA: Diagnosis not present

## 2020-05-17 NOTE — Telephone Encounter (Signed)
Pt's wife called stating he needed to be seen today due to his nail coming off. I was able to get him in today at 2:30 with Dr. Sherryle Lis.

## 2020-05-17 NOTE — Patient Instructions (Signed)

## 2020-05-22 NOTE — Progress Notes (Signed)
  Subjective:  Patient ID: Jacob [name], male    DOB: [birth year],  MRN: [id number]  Chief Complaint  Patient presents with  . Nail Problem        URGENT WORK IN. pt's nail is coming off     75 y.o. male presents with the above complaint. History confirmed with patient.   Objective:  Physical Exam: warm, good capillary refill, no trophic changes or ulcerative lesions, normal DP and PT pulses and normal sensory exam. Left Foot: hallux nail nearly completely avulsed, no exposed bone or s/o infection  Assessment:   1. Traumatic avulsion of nail plate of toe, initial encounter      Plan:  Patient was evaluated and treated and all questions answered.  Nail plate was avulsed following sterile prep with betadine and local ethyl chloride spray for topical anesthesia, no block required. Dressed with silvadene and bandage, soaking/care instructions given.   Return if symptoms worsen or fail to improve.

## 2020-05-25 ENCOUNTER — Ambulatory Visit: Payer: Medicare Other | Admitting: Podiatry

## 2020-05-27 DIAGNOSIS — R110 Nausea: Secondary | ICD-10-CM | POA: Insufficient documentation

## 2020-05-27 DIAGNOSIS — F32A Depression, unspecified: Secondary | ICD-10-CM | POA: Insufficient documentation

## 2020-05-27 DIAGNOSIS — F419 Anxiety disorder, unspecified: Secondary | ICD-10-CM | POA: Insufficient documentation

## 2020-05-27 DIAGNOSIS — R52 Pain, unspecified: Secondary | ICD-10-CM | POA: Insufficient documentation

## 2020-06-27 ENCOUNTER — Inpatient Hospital Stay: Payer: Medicare Other | Attending: Oncology

## 2020-06-27 ENCOUNTER — Inpatient Hospital Stay: Payer: Medicare Other | Admitting: Oncology

## 2020-06-27 ENCOUNTER — Other Ambulatory Visit: Payer: Self-pay

## 2020-06-27 DIAGNOSIS — Z923 Personal history of irradiation: Secondary | ICD-10-CM | POA: Diagnosis not present

## 2020-06-27 DIAGNOSIS — D631 Anemia in chronic kidney disease: Secondary | ICD-10-CM

## 2020-06-27 DIAGNOSIS — Z8546 Personal history of malignant neoplasm of prostate: Secondary | ICD-10-CM | POA: Diagnosis not present

## 2020-06-27 DIAGNOSIS — Z79899 Other long term (current) drug therapy: Secondary | ICD-10-CM | POA: Insufficient documentation

## 2020-06-27 DIAGNOSIS — Z888 Allergy status to other drugs, medicaments and biological substances status: Secondary | ICD-10-CM | POA: Diagnosis not present

## 2020-06-27 DIAGNOSIS — C169 Malignant neoplasm of stomach, unspecified: Secondary | ICD-10-CM | POA: Diagnosis not present

## 2020-06-27 DIAGNOSIS — N189 Chronic kidney disease, unspecified: Secondary | ICD-10-CM

## 2020-06-27 DIAGNOSIS — R5383 Other fatigue: Secondary | ICD-10-CM | POA: Diagnosis not present

## 2020-06-27 DIAGNOSIS — D472 Monoclonal gammopathy: Secondary | ICD-10-CM

## 2020-06-27 LAB — CBC WITH DIFFERENTIAL (CANCER CENTER ONLY)
Abs Immature Granulocytes: 0.03 10*3/uL (ref 0.00–0.07)
Basophils Absolute: 0.1 10*3/uL (ref 0.0–0.1)
Basophils Relative: 1 %
Eosinophils Absolute: 0.4 10*3/uL (ref 0.0–0.5)
Eosinophils Relative: 5 %
HCT: 27.9 % — ABNORMAL LOW (ref 39.0–52.0)
Hemoglobin: 8.9 g/dL — ABNORMAL LOW (ref 13.0–17.0)
Immature Granulocytes: 0 %
Lymphocytes Relative: 21 %
Lymphs Abs: 1.5 10*3/uL (ref 0.7–4.0)
MCH: 31.3 pg (ref 26.0–34.0)
MCHC: 31.9 g/dL (ref 30.0–36.0)
MCV: 98.2 fL (ref 80.0–100.0)
Monocytes Absolute: 0.7 10*3/uL (ref 0.1–1.0)
Monocytes Relative: 10 %
Neutro Abs: 4.7 10*3/uL (ref 1.7–7.7)
Neutrophils Relative %: 63 %
Platelet Count: 172 10*3/uL (ref 150–400)
RBC: 2.84 MIL/uL — ABNORMAL LOW (ref 4.22–5.81)
RDW: 15.4 % (ref 11.5–15.5)
WBC Count: 7.3 10*3/uL (ref 4.0–10.5)
nRBC: 0 % (ref 0.0–0.2)

## 2020-06-27 LAB — CMP (CANCER CENTER ONLY)
ALT: 17 U/L (ref 0–44)
AST: 22 U/L (ref 15–41)
Albumin: 3.4 g/dL — ABNORMAL LOW (ref 3.5–5.0)
Alkaline Phosphatase: 54 U/L (ref 38–126)
Anion gap: 7 (ref 5–15)
BUN: 45 mg/dL — ABNORMAL HIGH (ref 8–23)
CO2: 22 mmol/L (ref 22–32)
Calcium: 9.9 mg/dL (ref 8.9–10.3)
Chloride: 111 mmol/L (ref 98–111)
Creatinine: 2.97 mg/dL — ABNORMAL HIGH (ref 0.61–1.24)
GFR, Estimated: 21 mL/min — ABNORMAL LOW (ref >60)
Glucose, Bld: 95 mg/dL (ref 70–99)
Potassium: 5.7 mmol/L — ABNORMAL HIGH (ref 3.5–5.1)
Sodium: 140 mmol/L (ref 135–145)
Total Bilirubin: 0.4 mg/dL (ref 0.3–1.2)
Total Protein: 7 g/dL (ref 6.5–8.1)

## 2020-06-27 NOTE — Progress Notes (Signed)
Hematology and Oncology Follow Up Visit  Jacob [name] [id number] 1945/12/25 75 y.o. 06/27/2020 9:15 AM System, Provider Not Zella Richer, MD   Principle Diagnosis:  75 year old with T3N0 adenocarcinoma of the stomach diagnosed in 2017.  Secondary diagnoses:   1.  Prostate cancer diagnosed in 2013.  He presented with stage T1c, Gleason score 3+4 = 7 and a PSA 5.66.    2.  IgG lambda MGUS diagnosed in 2017.  No evidence of active myeloma or indication for treatment.  3.  Anemia of chronic kidney disease.  Prior Therapy:  He is status post radiation therapy for definitive treatment for his prostate cancer. Therapy concluded in December 2013. He is status post packed red cell transfusions in April 2017. He is status post bone marrow biopsy in April 2017. Results did not show any plasma cell disorder or myelodysplasia. He is status post IV iron infusion completed in April 2017. This was repeated in September 2017. FOLFOX chemotherapy with cycle 1 to be given on 12/12/2015.  He is S/P cycle 5 of therapy given on 02/20/2016. He is status post distal gastrectomy and Billroth II reconstruction completed on 03/25/2016. The final pathology revealed a T3 N0 residual tumor without any lymphadenopathy involved.  Current therapy: Active surveillance.   Interim History:  Jacob [name] presents today for a follow-up visit.  Since her last visit, he reports no major changes in his health.  He continues to have reasonable quality of life and ambulates without any major difficulties.  He does report some occasional fatigue and tiredness but no shortness of breath or difficulty breathing.  He denies any hematochezia or melena.  His performance status quality of life remains unchanged.      Medications: Updated on review. Current Outpatient Medications  Medication Sig Dispense Refill   acetaminophen (TYLENOL) 500 MG tablet Take 1,000 mg by mouth daily as needed for moderate pain or  headache.     allopurinol (ZYLOPRIM) 300 MG tablet Take 300 mg by mouth daily.     buPROPion (WELLBUTRIN XL) 300 MG 24 hr tablet Take 300 mg by mouth daily.      busPIRone (BUSPAR) 10 MG tablet Take 10 mg by mouth.     calcium carbonate (TUMS - DOSED IN MG ELEMENTAL CALCIUM) 500 MG chewable tablet Chew 3 tablets by mouth 2 (two) times daily as needed for indigestion or heartburn.     cetirizine (ZYRTEC) 10 MG tablet Take 10 mg by mouth daily.     diclofenac Sodium (VOLTAREN) 1 % GEL Apply 1 application topically 2 (two) times daily as needed (pain).      furosemide (LASIX) 20 MG tablet Take 20 mg by mouth daily.      gabapentin (NEURONTIN) 400 MG capsule Take 400 mg by mouth.     hydrALAZINE (APRESOLINE) 25 MG tablet Take 25 mg by mouth 3 (three) times daily.      levothyroxine (SYNTHROID) 112 MCG tablet Take 112 mcg by mouth daily before breakfast.     LOKELMA 5 g packet TAKE 5 GRAMS BY MOUTH TWO TIMES A WEEK (Patient taking differently: Take 5 g by mouth 2 (two) times a week.) 15 packet 0   melatonin 3 MG TABS tablet Take 9 mg by mouth at bedtime.     Nebivolol HCl 20 MG TABS Take 20 mg by mouth daily.     pantoprazole (PROTONIX) 40 MG tablet Take 1 tablet (40 mg total) by mouth daily before supper. (Patient not taking: No sig reported) 30  tablet 0   polycarbophil (FIBERCON) 625 MG tablet Take 1 tablet (625 mg total) by mouth daily. 30 tablet 3   polyethylene glycol (MIRALAX / GLYCOLAX) 17 g packet Take 17 g by mouth 2 (two) times daily. 14 each 0   prochlorperazine (COMPAZINE) 10 MG tablet TAKE ONE TABLET BY MOUTH EVERY 6 HOURS AS NEEDED FOR NAUSSEA AND VOMITING (Patient taking differently: Take 10 mg by mouth every 6 (six) hours as needed for nausea or vomiting.) 30 tablet 1   sodium bicarbonate 650 MG tablet Take 650 mg by mouth 2 (two) times daily.     sodium zirconium cyclosilicate (LOKELMA) 10 g PACK packet Take 10 g by mouth.     tamsulosin (FLOMAX) 0.4 MG CAPS capsule Take 1 capsule  (0.4 mg total) by mouth daily. 90 capsule 3   venlafaxine XR (EFFEXOR-XR) 150 MG 24 hr capsule Take 150 mg by mouth every morning. For depression/anxiety     verapamil (CALAN) 80 MG tablet Take 80 mg by mouth 3 (three) times daily.      No current facility-administered medications for this visit.     Allergies:  Allergies  Allergen Reactions   Lisinopril Cough       Physical Exam:     There were no vitals taken for this visit.   ECOG: 1   General appearance: Comfortable appearing without any discomfort Head: Normocephalic without any trauma Oropharynx: Mucous membranes are moist and pink without any thrush or ulcers. Eyes: Pupils are equal and round reactive to light. Lymph nodes: No cervical, supraclavicular, inguinal or axillary lymphadenopathy.   Heart:regular rate and rhythm.  S1 and S2 without leg edema. Lung: Clear without any rhonchi or wheezes.  No dullness to percussion. Abdomin: Soft, nontender, nondistended with good bowel sounds.  No hepatosplenomegaly. Musculoskeletal: No joint deformity or effusion.  Full range of motion noted. Neurological: No deficits noted on motor, sensory and deep tendon reflex exam. Skin: No petechial rash or dryness.  Appeared moist.         Lab Results: Lab Results  Component Value Date   WBC 6.5 05/11/2020   HGB 9.9 (L) 05/11/2020   HCT 31.5 (L) 05/11/2020   MCV 99.7 05/11/2020   PLT 174 05/11/2020     Chemistry      Component Value Date/Time   NA 134 (L) 05/11/2020 1445   NA 141 01/16/2017 0924   K 4.6 05/11/2020 1445   K 4.8 01/16/2017 0924   CL 107 05/11/2020 1445   CO2 20 (L) 05/11/2020 1445   CO2 25 01/16/2017 0924   BUN 26 (H) 05/11/2020 1445   BUN 24.0 01/16/2017 0924   CREATININE 2.73 (H) 05/11/2020 1445   CREATININE 2.62 (H) 12/23/2019 1233   CREATININE 1.9 (H) 01/16/2017 0924      Component Value Date/Time   CALCIUM 10.1 05/11/2020 1445   CALCIUM 9.8 01/16/2017 0924   ALKPHOS 51 05/11/2020 1445    ALKPHOS 54 01/16/2017 0924   AST 15 05/11/2020 1445   AST 19 12/23/2019 1233   AST 16 01/16/2017 0924   ALT 9 05/11/2020 1445   ALT 10 12/23/2019 1233   ALT 10 01/16/2017 0924   BILITOT 0.8 05/11/2020 1445   BILITOT 0.5 12/23/2019 1233   BILITOT 0.54 01/16/2017 0924        IImpression and Plan:  75 year old man with:    1.  T3N0 adenocarcinoma of gastric primary diagnosed in 2017.    His disease status was updated at this time  and management choices moving forward were discussed.  He has had no evidence of relapsed disease at this time but will need to update his staging scans before the next visit in December 2022.  Salvage therapy such as systemic chemotherapy and immunotherapy will be deferred unless he developed relapsed disease.  He is agreeable to continue at this time and likely suspend any additional imaging studies after the next scan.  2.  Anemia: Due to chronic renal disease with hemoglobin overall adequate and does not require any growth factor support at this time.  Risks and benefits of follow-up restarting Aranesp or Procrit were discussed.  His hemoglobin today is close to 9 and has deferred the option of any additional injections.  We will update his iron studies with the next visit as well.  We will consider Procrit in the future.  3.  IgG MGUS: No evidence of multiple myeloma currently on active surveillance.  We will update protein studies in December 2022 and annually after that.    4. Renal insufficiency: His kidney function remained stable with a creatinine clearance around 25 cc/min.   5. Follow-up: In 6 months for repeat evaluation.  30  minutes were dedicated to this encounter.  The time was spent on reviewing laboratory data, disease status update and outlining future plan of care.   Zola Button, MD 6/15/20229:15 AM

## 2020-06-29 ENCOUNTER — Other Ambulatory Visit: Payer: Self-pay | Admitting: Family Medicine

## 2020-08-13 ENCOUNTER — Other Ambulatory Visit: Payer: Self-pay

## 2020-08-13 ENCOUNTER — Ambulatory Visit: Payer: Medicare Other | Admitting: Neurology

## 2020-08-13 VITALS — BP 154/91 | HR 78 | Ht 74.0 in | Wt 237.0 lb

## 2020-08-13 DIAGNOSIS — N178 Other acute kidney failure: Secondary | ICD-10-CM | POA: Diagnosis not present

## 2020-08-13 DIAGNOSIS — G3184 Mild cognitive impairment, so stated: Secondary | ICD-10-CM | POA: Insufficient documentation

## 2020-08-13 DIAGNOSIS — N1832 Chronic kidney disease, stage 3b: Secondary | ICD-10-CM

## 2020-08-13 DIAGNOSIS — C9000 Multiple myeloma not having achieved remission: Secondary | ICD-10-CM | POA: Diagnosis not present

## 2020-08-13 DIAGNOSIS — R293 Abnormal posture: Secondary | ICD-10-CM | POA: Insufficient documentation

## 2020-08-13 MED ORDER — DONEPEZIL HCL 5 MG PO TABS: 5.0000 mg | ORAL_TABLET | Freq: Every day | ORAL | 6 refills | Status: DC

## 2020-08-13 NOTE — Progress Notes (Signed)
   Provider:    , M D  Referring Provider:   Primary Care Physician: [facility],    ED physician - Julie HAVILAND, MD   Pt alone, rm 10. Presents today for a consult due to new onset memory concerns that have worsened over the last 2-3 mths. He was told they thought he may havehad a mini stroke- see MRI and CT- none was found- no previous memory test by PCP is found, and this patient came here without a family member for assistance and history taking.   HPI:  Jacob [name] is a 75 y.o. male  and now receives primary care form [facility], when his provider did sent him to ED-  I have no referring physician listed.  The ED mentioned no neurological concern.   Today is 13 August 2020 and [name] is seen here alone without family or friend accompanying him.  He is not sure who referred him, he did not know that he was in the emergency room but not in which 1.  He was sure it was physician within [facility] however.  He is frequently at the cancer center: Immunofixation shows IgG monoclonal protein with lambda light chain specificity. He has anemia and has abnormal values for protein.    So his emergency room visit dated from 05-11-2020 and he presented to the emergency room with memory issues the patient's daughter gave the history and she said that he was normally very sharp and primary caregiver for his wife who is on different medications, including insulin.  So for the third week of April he began having difficulties to apply the insulin.  He did not have any pain but he felt foggy.  There was no fever no obvious sign of illness.  She also had we recall that he become very unsteady in the month of March and did fall in the parking lot about a month ago but did not hit his head.  At that time he was just seen in the urgent care for follow-up.  He has a history of benign prostatic hyperplasia, chronic kidney disease but not sure which great,  constipation, history of depression, epigastric pain with GERD, and he has a history of radiation therapy for stomach cancer.  He also has prostate cancer.  Hypertension, night sweats hypercholesterolemia he is on 2 L of oxygen at night but does not need a CPAP he states.   He had a Port-A-Cath in place okay for occasionally had hypertensive events that made emergency room visits necessary and hyperkalemia has occurred.  He did have a gastroesophageal hemorrhage in 2017.  It seems that his chronic kidney disease in 2015 was already staged at 2.  At that time he was still a patient of Dr. [name].  I reviewed his current medication list he is on Wellbutrin 3 mg a day takes Zyloprim or allopurinol for gout, Tylenol as needed Tums for calcium supply Zyrtec as needed, Voltaren cream 1%, Lasix 20 mg daily, gabapentin 40 mg by mouth at night, hydralazine 3 times a day, melatonin at night nebivolol 20 mg daily, Protonix, FiberCon, Dulcolax, Compazine, Effexor 150 mg and Kaleab verapamil 80 mg 3 times a day.  The patient had undergone a chest x-ray in April 2022 lungs were clear.  A CT without contrast was obtained in the emergency room due to his acute mental status change.  Head CT mild generalized age-related parenchymal volume loss, dilation of ventricles and sulci, mild   chronic small vessel changes.  Negative for fracture, focal lesion, tumor or stroke.  No acute findings.  An MRI of the brain was then obtained after the CT was completed.  No focal cerebellar findings cerebellar atrophy small vessel ischemic changes subcortical white matter affected.  Generalized atrophy with moderate small vessel ischemic changes by MRI and left maxillary sinusitis.Other: None   IMPRESSION: 1. No acute or reversible finding. Generalized atrophy with moderate chronic small-vessel ischemic changes of the pons and cerebral hemispheric white matter. 2. Left maxillary rhinosinusitis.     Electronically Signed   By: Nelson Chimes M.D.   On: 05/11/2020 20:33  Evidence of difficulties recalling reason for today's visit for circumstances that have led to the visit.  I have seen this patient 7 or 8 years ago for a single polysomnography study at the time he did not have apnea and therefore there was no follow-up needed.  My nurses today did a MoCA test [place] cognitive assessment with the patient he scored 25 out of 30 points.  He was able to recall 3 of 5 words was fully oriented, repetition and serial sevens were intact.  He did generate 9 words with the letter F so he felt short by 2 he was able to name the 3 animals could not draw clock face a cube but he did not past the Trail Making Test.  So based on this he could well suffer from a mild memory loss disorder or so-called mild cognitive impairment.  Review of Systems: Out of a complete 14 system review, the patient complains of only the following symptoms, and all other reviewed systems are negative.   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   Social History   Socioeconomic History   Marital status: Married    Spouse name: Jacob [name]   Number of children: 3   Years of education: 12   Highest education level: Not on file  Occupational History   Occupation: retired    Fish farm manager: [employer]  Tobacco Use   Smoking status: Former    Packs/day: 1.50    Years: 30.00    Pack years: 45.00    Types: Cigarettes    Quit date: 04/13/2000    Years since quitting: 20.3   Smokeless tobacco: Never  Vaping Use   Vaping Use: Never used  Substance and Sexual Activity   Alcohol use: Not Currently    Alcohol/week: 1.0 standard drink    Types: 1 Cans of beer per week    Comment: a beer about once a week, less than in his youth   Drug use: No   Sexual activity: Not Currently  Other Topics Concern   Not on file  Social History Narrative   Patient is married Jacob [name]) and lives at home with his wife and one child.    Patient has three children.   Patient is retired.   Patient has a high school education.   Patient is ambi-dextrous.   Patient drinks two cups of coffee about three times a week.    Social Determinants of Health   Financial Resource Strain: Not on file  Food Insecurity: Not on file  Transportation Needs: Not on file  Physical Activity: Not on file  Stress: Not on file  Social Connections: Not on file  Intimate Partner Violence: Not on file    Family History  Problem Relation Age of Onset   Cancer Mother  NOS   Alcohol abuse Father    Alcohol abuse Brother 50   Cancer Paternal Aunt        NOS   Colon cancer Neg Hx     Past Medical History:  Diagnosis Date   Anemia    hx iron deficiency   Anxiety    Arthritis    gout   Back pain    BPH with obstruction/lower urinary tract symptoms    Chronic kidney disease    "they said I do"   Constipation    Depression    ED (erectile dysfunction)    Epigastric pain    GERD (gastroesophageal reflux disease)    Heartburn    History of blood transfusion    History of radiation therapy 11/03/11-12/29/11   prostate   Hypercholesterolemia    Hypertension    Night sweats    Oxygen deficiency    2x per week at night for sleep   Post-operative nausea and vomiting 04/09/2016   Prostate cancer (HCC) 08/14/11   Adenocarcinoma,gleason:3+3=6,& 3+4=7,PSA=5.66   PUD (peptic ulcer disease)    Sleep apnea    does not use Cpap but does use O2 @ 2l    Stomach cancer (HCC)    Ulcer    peptic ulcer hx    Past Surgical History:  Procedure Laterality Date   colon polyps bx  11/24/06   colon,transverse and rectosigmoid polyps:tubular adenomas and hyperplastic polyps,no high grade dysplasia or malignancy    COLONOSCOPY WITH PROPOFOL N/A 08/24/2019   Procedure: COLONOSCOPY WITH PROPOFOL;  Surgeon: Cirigliano, Vito V, DO;  Location: [facility];  Service: Gastroenterology;  Laterality: N/A;   duodenal bx  11/24/06   benign    ESOPHAGOGASTRODUODENOSCOPY N/A 10/27/2015   Procedure: ESOPHAGOGASTRODUODENOSCOPY (EGD);  Surgeon: Carl E Gessner, MD;  Location: [facility];  Service: Endoscopy;  Laterality: N/A;   EUS N/A 11/08/2015   Procedure: UPPER ENDOSCOPIC ULTRASOUND (EUS) RADIAL;  Surgeon: Daniel P Jacobs, MD;  Location: [facility];  Service: Endoscopy;  Laterality: N/A;   GASTRECTOMY N/A 03/25/2016   Procedure: DISTAL GASTRECTOMY;  Surgeon: Faera Byerly, MD;  Location: [facility];  Service: General;  Laterality: N/A;   gastric bx  11/24/06   chronic active gastritis,with metaplasia and focal changaes of xanthelasma   GASTROSTOMY N/A 03/25/2016   Procedure: INSERTION OF FEEDING TUBE;  Surgeon: Faera Byerly, MD;  Location: [facility];  Service: General;  Laterality: N/A;   INSERTION PROSTATE RADIATION SEED  12-29-11   IR GENERIC HISTORICAL  04/01/2016   IR GJ TUBE CHANGE 04/01/2016 Adam Henn, MD [facility]   IR GENERIC HISTORICAL  04/05/2016   IR GJ TUBE CHANGE 04/05/2016 Adam Henn, MD [facility]   LAPAROSCOPIC GASTRECTOMY  03/25/2016   Diagnostic laparoscopy, distal gastrectomy with Billroth 2 reconstruction and gastrojejunostomy tube   LAPAROSCOPY N/A 03/25/2016   Procedure: DIAGNOSTIC LAPAROSCOPY;  Surgeon: Faera Byerly, MD;  Location: [facility];  Service: General;  Laterality: N/A;   POLYPECTOMY  08/24/2019   Procedure: POLYPECTOMY;  Surgeon: Cirigliano, Vito V, DO;  Location: [facility];  Service: Gastroenterology;;   PORTACATH PLACEMENT Left 12/04/2015   Procedure: INSERTION PORT-A-CATH;  Surgeon: Faera Byerly, MD;  Location: [facility];  Service: General;  Laterality: Left;   PROSTATE BIOPSY  08/14/11   Adenocarcinoma/volume=58.51cc,gleason=3+3=6 & 3+4=7   TONSILLECTOMY     75 years old    Current Outpatient Medications  Medication Sig Dispense Refill   acetaminophen (TYLENOL) 500 MG tablet Take 1,000 mg by mouth daily as needed   for moderate pain or headache.     allopurinol (ZYLOPRIM) 300 MG tablet Take 300 mg by  mouth daily.     buPROPion (WELLBUTRIN XL) 300 MG 24 hr tablet Take 300 mg by mouth daily.      busPIRone (BUSPAR) 10 MG tablet Take 10 mg by mouth.     calcium carbonate (TUMS - DOSED IN MG ELEMENTAL CALCIUM) 500 MG chewable tablet Chew 3 tablets by mouth 2 (two) times daily as needed for indigestion or heartburn.     cetirizine (ZYRTEC) 10 MG tablet Take 10 mg by mouth daily.     diclofenac Sodium (VOLTAREN) 1 % GEL Apply 1 application topically 2 (two) times daily as needed (pain).      furosemide (LASIX) 20 MG tablet Take 20 mg by mouth daily.      gabapentin (NEURONTIN) 400 MG capsule Take 400 mg by mouth.     hydrALAZINE (APRESOLINE) 25 MG tablet Take 25 mg by mouth 3 (three) times daily.      levothyroxine (SYNTHROID) 112 MCG tablet Take 112 mcg by mouth daily before breakfast.     LOKELMA 5 g packet TAKE 5 GRAMS BY MOUTH TWO TIMES A WEEK (Patient taking differently: Take 5 g by mouth 2 (two) times a week.) 15 packet 0   melatonin 3 MG TABS tablet Take 9 mg by mouth at bedtime.     Nebivolol HCl 20 MG TABS Take 20 mg by mouth daily.     pantoprazole (PROTONIX) 40 MG tablet Take 1 tablet (40 mg total) by mouth daily before supper. 30 tablet 0   polycarbophil (FIBERCON) 625 MG tablet Take 1 tablet (625 mg total) by mouth daily. 30 tablet 3   polyethylene glycol (MIRALAX / GLYCOLAX) 17 g packet Take 17 g by mouth 2 (two) times daily. 14 each 0   prochlorperazine (COMPAZINE) 10 MG tablet TAKE ONE TABLET BY MOUTH EVERY 6 HOURS AS NEEDED FOR NAUSSEA AND VOMITING (Patient taking differently: Take 10 mg by mouth every 6 (six) hours as needed for nausea or vomiting.) 30 tablet 1   sodium bicarbonate 650 MG tablet Take 650 mg by mouth 2 (two) times daily.     sodium zirconium cyclosilicate (LOKELMA) 10 g PACK packet Take 10 g by mouth.     tamsulosin (FLOMAX) 0.4 MG CAPS capsule Take 1 capsule (0.4 mg total) by mouth daily. 90 capsule 3   venlafaxine XR (EFFEXOR-XR) 150 MG 24 hr capsule Take 150 mg  by mouth every morning. For depression/anxiety     verapamil (CALAN) 80 MG tablet Take 80 mg by mouth 3 (three) times daily.      No current facility-administered medications for this visit.    Allergies as of 08/13/2020 - Review Complete 08/13/2020  Allergen Reaction Noted   Lisinopril Cough 06/29/2008    Vitals: BP (!) 154/91   Pulse 78   Ht 6' 2" (1.88 m)   Wt 237 lb (107.5 kg)   BMI 30.43 kg/m  Last Weight:  Wt Readings from Last 1 Encounters:  08/13/20 237 lb (107.5 kg)   Last Height:   Ht Readings from Last 1 Encounters:  08/13/20 6' 2" (1.88 m)    Physical exam:  General: The patient is awake, alert and appears not in acute distress. The patient is groomed. He lost weight- 50 pounds.  Head: Normocephalic, atraumatic. Neck is supple., neck circumference:16.5 Cardiovascular:  Regular rate and rhythm, without  murmurs or carotid bruit, and without distended neck veins. Respiratory:   Lungs are clear to auscultation. Skin:  With evidence of edema, swelling up during the day.  Trunk: BMI is elevated and patient has normal posture.  Neurologic exam : The patient is awake and alert, oriented to place and time.  Memory subjective  described as intact.  He is unable to recall why  he I referred here and by whom  There is no normal attention span & concentration ability. Speech is non-fluent without  dysarthria, but dysphonia or aphasia.  Hoarse- tremulous voice,  Mood and affect are defensive.   Montreal Cognitive Assessment  08/13/2020  Visuospatial/ Executive (0/5) 4  Naming (0/3) 3  Attention: Read list of digits (0/2) 1  Attention: Read list of letters (0/1) 1  Attention: Serial 7 subtraction starting at 100 (0/3) 3  Language: Repeat phrase (0/2) 2  Language : Fluency (0/1) 0  Abstraction (0/2) 2  Delayed Recall (0/5) 3  Orientation (0/6) 6  Total 25     Cranial nerves: Pupils are equal and briskly reactive to light. Funduscopic exam without  evidence of pallor  or edema. Extraocular movements  in vertical and horizontal planes intact and without nystagmus.  Visual fields by finger perimetry are intact. Hearing to finger rub intact.  Facial sensation intact to fine touch. Facial motor strength is symmetric and tongue and uvula move midline. Tongue protrusion into either cheek is normal. Shoulder shrug is normal.   Motor exam:   Normal muscle bulk and strength in all extremities. Mild rigor over the right biceps. The patient is left handed.  The patient has pronator drift during finger-to-nose maneuver on the left, he had dysmetria and tremor in both hands.  Sensory:  Fine touch, pinprick and vibration were tested in all extremities.  Proprioception was normal.  Coordination: Rapid alternating movements in the fingers/hands were normal. Finger-to-nose maneuver normal without evidence of ataxia, dysmetria or tremor.   Strength within normal limits. Stance is stable and normal. Romberg testing is negative   The patient was able to rise from a seated position by bracing himself.  There was no assistance from eyesight needed.  We walked down our hallway and back he turns his 4 steps he seems to have a slight shuffling gait.  He is a little bit stooped.  From the back watching on he seems to slightly bent more over to his right.  There is no tremor noticed as he walks, there is bilateral the same arm swing swing.  Also I did not notice a foot drop again I noticed some shuffling. Deep tendon reflexes: in the  upper and lower extremities are trace only- symmetric . Babinski maneuver response was deferred.    Final review of available laboratory results show that his comprehensive metabolic panel has been highly abnormal his BUN has been between 31 and 26 indicating that he is dehydrated his creatinine has been 2.73 on April 29 so this is a significant impairment of his kidney function, his albumin level is reduced to only 3 g/dL, CO2 is decreased, potassium was  normal.  Urine sample was proteinuria.  INR was 1.1.  Capillary blood glucose was 92 mg/dL. Further labs from the cancer center show an RBC of 2.84 hemoglobin of only 8.9 a month ago 06-27-2020 hematocrit 27.9.And at that time potassium was measured at 6.9 which is very high.   Component Ref Range & Units 7 mo ago 1 yr ago 2 yr ago  Kappa free light chain 3.3 - 19.4 mg/L 139.6 High     148.1 High   134.7 High    Lambda free light chains 5.7 - 26.3 mg/L 42.3 High   40.3 High   38.3 High    Kappa, lambda light chain ratio 0.26 - 1.65 3.30 High   3.67 High  CM  3.52 High  CM   Comment: (NOTE)   Result Notes Immunofixation shows IgG monoclonal protein with lambda light chain  specificity.  Component Ref Range & Units 7 mo ago 1 yr ago 2 yr ago 4 yr ago 5 yr ago  IgG (Immunoglobin G), Serum 603 - 1,613 mg/dL 946  1,065  1,181 R  1,042 R  1,157 R   IgA 61 - 437 mg/dL 281  272  414     IgM (Immunoglobulin M), Srm 15 - 143 mg/dL 30  32  43     Total Protein ELP 6.0 - 8.5 g/dL 5.9 Low  VC  6.4 VC  6.7 VC     Albumin SerPl Elph-Mcnc 2.9 - 4.4 g/dL 3.1 VC  3.1 VC  3.4 VC  3.0  3.1   Alpha 1 0.0 - 0.4 g/dL 0.2 VC  0.3 VC  0.2 VC  0.3  0.3   Alpha2 Glob SerPl Elph-Mcnc 0.4 - 1.0 g/dL 0.8 VC  0.8 VC  0.8 VC  0.6  0.7   B-Globulin SerPl Elph-Mcnc 0.7 - 1.3 g/dL 0.9 VC  1.0 VC  1.1 VC  1.1  1.2   Gamma Glob SerPl Elph-Mcnc 0.4 - 1.8 g/dL 0.9 VC  1.1 VC  1.1 VC  0.9  1.1   M Protein SerPl Elph-Mcnc Not Observed g/dL 0.2 High  VC  Not Observed VC  0.3 High  VC  0.2 High   0.3 High    Globulin, Total 2.2 - 3.9 g/dL 2.8 VC  3.3 VC  3.3 VC  2.9  3.3   Albumin/Glob SerPl 0.7 - 1.7 1.2 VC  1.0 VC  1.1 VC  1.1  1.0   IFE 1  Comment Abnormal  VC  Comment Abnormal           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    Assessment:  After physical and neurologic examination, review of laboratory studies, imaging, neurophysiology testing and pre-existing records, assessment is that  of :  Patient unaware of reason for visit and why he was seen by the sleep clinic (?). 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 Definitely MCI , perhaps beginning dementia.  Underlying malignancy with severe changes to cause Anemia and abnormal metabolic renal function.  Patient relates his gait changes to surgery. Monoclonal M spike can also cause a neuropathy.   Plan:  Treatment plan and additional workup :  Please get me a contact with his primary care. I am not sure which conditions have developed and which medications the patient can take.  MOCA score is not necessarily warranting start of aricept or namenda.  Rv in 6 month with a new MOCA test with NP.       MD 08/13/2020             

## 2020-08-13 NOTE — Patient Instructions (Signed)

## 2020-08-16 ENCOUNTER — Telehealth: Payer: Self-pay

## 2020-08-16 NOTE — Telephone Encounter (Signed)
error 

## 2020-10-08 ENCOUNTER — Other Ambulatory Visit: Payer: Self-pay | Admitting: Urology

## 2020-11-06 ENCOUNTER — Ambulatory Visit (HOSPITAL_COMMUNITY)
Admission: EM | Admit: 2020-11-06 | Discharge: 2020-11-06 | Disposition: A | Discharge status: Home / Self Care | Payer: Medicare Other | Attending: Emergency Medicine | Admitting: Emergency Medicine

## 2020-11-06 ENCOUNTER — Other Ambulatory Visit: Payer: Self-pay

## 2020-11-06 ENCOUNTER — Encounter (HOSPITAL_COMMUNITY): Payer: Self-pay | Admitting: Emergency Medicine

## 2020-11-06 DIAGNOSIS — M25512 Pain in left shoulder: Secondary | ICD-10-CM | POA: Diagnosis not present

## 2020-11-06 MED ORDER — DICLOFENAC SODIUM 1 % EX GEL
2.0000 g | Freq: Four times a day (QID) | CUTANEOUS | 1 refills | Status: DC
Start: 2020-11-06 — End: 2023-10-26

## 2020-11-06 MED ORDER — LIDOCAINE 5 % EX OINT: 1.0000 "application " | TOPICAL_OINTMENT | CUTANEOUS | 0 refills | Status: DC | PRN

## 2020-11-06 NOTE — ED Triage Notes (Signed)
Pt presents with left shoulder pain and back pain xs 1 week.

## 2020-11-06 NOTE — Discharge Instructions (Signed)
Your pain is most likely caused by irritation to the muscles or ligaments.   You can rub diclofenac cream onto area four times a day  Can rub lidocaine cream onto area four times a day in addition to diclofenac gel   You may use heating pad in 15 minute intervals as needed for additional comfort, within the first 2-3 days you may find comfort in using ice in 10-15 minutes over affected area  Begin stretching affected area daily for 10 minutes as tolerated to further loosen muscles   When lying down place pillow underneath and between knees for support  Can try sleeping without pillow on firm mattress   Practice good posture: head back, shoulders back, chest forward, pelvis back and weight distributed evenly on both legs  If pain persist after recommended treatment or reoccurs if may be beneficial to follow up with orthopedic specialist for evaluation, this doctor specializes in the bones and can manage your symptoms long-term with options such as but not limited to imaging, medications or physical therapy

## 2020-11-08 NOTE — ED Provider Notes (Signed)
[facility]    CSN: [id number] Arrival date & time: 11/06/20  1433      History   Chief Complaint Chief Complaint  Patient presents with   Shoulder Pain    Left   Back Pain    HPI Channon Steege is a 75 y.o. male.   Patient presents with left-sided shoulder pain and left-sided back pain for 1 week.  Describes pain as a soreness.  Range of motion intact.  Denies precipitating event, prior injury or trauma, pushing pulling or lifting, numbness or tingling.  History of arthritis, back pain, cancer, hypertension, GERD, depression, constipation, BPH.  Past Medical History:  Diagnosis Date   Anemia    hx iron deficiency   Anxiety    Arthritis    gout   Back pain    BPH with obstruction/lower urinary tract symptoms    Chronic kidney disease    "they said I do"   Constipation    Depression    ED (erectile dysfunction)    Epigastric pain    GERD (gastroesophageal reflux disease)    Heartburn    History of blood transfusion    History of radiation therapy 11/03/11-12/29/11   prostate   Hypercholesterolemia    Hypertension    Night sweats    Oxygen deficiency    2x per week at night for sleep   Post-operative nausea and vomiting 04/09/2016   Prostate cancer ([place]) 08/14/11   Adenocarcinoma,gleason:3+3=6,& 3+4=7,PSA=5.66   PUD (peptic ulcer disease)    Sleep apnea    does not use Cpap but does use O2 @ 2l    Stomach cancer ([place])    Ulcer    peptic ulcer hx    Patient Active Problem List   Diagnosis Date Noted   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   Stooped posture 08/13/2020   Multiple myeloma ([place]) 08/13/2020   Hyperkalemia 12/20/2019   History of stomach cancer 40/76/8088   Metabolic acidosis 11/15/1592   Hypoglycemia due to insulin 12/20/2019   Hypertensive urgency 12/20/2019   Personal history of colonic polyps    Diverticulosis of colon without hemorrhage    Multiple polyps of sigmoid colon    AKI (acute  kidney injury) ([place]) 09/15/2018   CKD (chronic kidney disease), stage III ([place]) 09/15/2018   Pneumonia 09/09/2018   Acute renal failure superimposed on stage 3 chronic kidney disease ([place]) 09/09/2018   SBO (small bowel obstruction) ([place]) 09/09/2018   Port-A-Cath in place 11/23/2017   Post-operative nausea and vomiting 04/09/2016   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   Genetic testing 12/19/2015   Stomach cancer ([place])    Gastric adenocarcinoma ([place])    Gastric mass    GI bleed 10/26/2015   Gastrointestinal hemorrhage    PUD (peptic ulcer disease)    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   Nocturnal hypoxemia 11/04/2013   Noncompliance with treatment 09/02/2013   Chronic kidney disease, stage II (mild) 08/30/2013   Morbid obesity ([place]) 07/26/2013   Fatigue 07/26/2013   Prostate cancer ([place]) 08/14/2011   Iron deficiency anemia 03/19/2007   DEPRESSION 03/19/2007   Essential hypertension 03/19/2007   NEOPLASM, BENIGN, STOMACH 11/23/2006   POLYP, COLON 11/23/2006   Grade I internal hemorrhoids 11/23/2006   GASTRITIS 11/23/2006    Past Surgical History:  Procedure Laterality Date   colon polyps bx  11/24/06   colon,transverse and rectosigmoid polyps:tubular adenomas and hyperplastic polyps,no high grade dysplasia or malignancy  COLONOSCOPY WITH PROPOFOL N/A 08/24/2019   Procedure: COLONOSCOPY WITH PROPOFOL;  Surgeon: [name], DO;  Location: [facility];  Service: Gastroenterology;  Laterality: N/A;   duodenal bx  11/24/06   benign   ESOPHAGOGASTRODUODENOSCOPY N/A 10/27/2015   Procedure: ESOPHAGOGASTRODUODENOSCOPY (EGD);  Surgeon: [name], MD;  Location: [facility];  Service: Endoscopy;  Laterality: N/A;   EUS N/A 11/08/2015   Procedure: UPPER ENDOSCOPIC ULTRASOUND (EUS) RADIAL;  Surgeon: [name], MD;  Location: [facility];  Service: Endoscopy;  Laterality: N/A;   GASTRECTOMY N/A 03/25/2016   Procedure: DISTAL GASTRECTOMY;   Surgeon: [name], MD;  Location: [place];  Service: General;  Laterality: N/A;   gastric bx  11/24/06   chronic active gastritis,with metaplasia and focal changaes of xanthelasma   GASTROSTOMY N/A 03/25/2016   Procedure: INSERTION OF FEEDING TUBE;  Surgeon: [name], MD;  Location: [place];  Service: General;  Laterality: N/A;   INSERTION PROSTATE RADIATION SEED  12-29-11   IR GENERIC HISTORICAL  04/01/2016   IR [place] TUBE CHANGE 04/01/2016 Markus Daft, MD [facility]   IR GENERIC HISTORICAL  04/05/2016   IR [place] TUBE CHANGE 04/05/2016 Markus Daft, MD [facility]   LAPAROSCOPIC GASTRECTOMY  03/25/2016   Diagnostic laparoscopy, distal gastrectomy with Billroth 2 reconstruction and gastrojejunostomy tube   LAPAROSCOPY N/A 03/25/2016   Procedure: DIAGNOSTIC LAPAROSCOPY;  Surgeon: [name], MD;  Location: [place];  Service: General;  Laterality: N/A;   POLYPECTOMY  08/24/2019   Procedure: POLYPECTOMY;  Surgeon: [name], DO;  Location: [facility];  Service: Gastroenterology;;   PORTACATH PLACEMENT Left 12/04/2015   Procedure: INSERTION PORT-A-CATH;  Surgeon: [name], MD;  Location: [place];  Service: General;  Laterality: Left;   PROSTATE BIOPSY  08/14/11   Adenocarcinoma/volume=58.51cc,gleason=3+3=6 & 3+4=7   TONSILLECTOMY     75 years old       Home Medications    Prior to Admission medications   Medication Sig Start Date End Date Taking? Authorizing Provider  diclofenac Sodium (VOLTAREN) 1 % GEL Apply 2 g topically 4 (four) times daily. 11/06/20  Yes Kashonda Sarkisyan R, NP  lidocaine (XYLOCAINE) 5 % ointment Apply 1 application topically as needed. 11/06/20  Yes Nivan Melendrez, Leitha Schuller, NP  acetaminophen (TYLENOL) 500 MG tablet Take 1,000 mg by mouth daily as needed for moderate pain or headache.    [provider]  allopurinol (ZYLOPRIM) 300 MG tablet Take 300 mg by mouth daily. 04/15/19   [provider]  buPROPion (WELLBUTRIN XL) 300 MG 24 hr tablet Take 300 mg  by mouth daily.  05/10/15   [provider]  busPIRone (BUSPAR) 10 MG tablet Take 10 mg by mouth.    [provider]  calcium carbonate (TUMS - DOSED IN MG ELEMENTAL CALCIUM) 500 MG chewable tablet Chew 3 tablets by mouth 2 (two) times daily as needed for indigestion or heartburn.    [provider]  cetirizine (ZYRTEC) 10 MG tablet Take 10 mg by mouth daily.    [provider]  donepezil (ARICEPT) 5 MG tablet Take 1 tablet (5 mg total) by mouth at bedtime. 08/13/20   Dohmeier, Asencion Partridge, MD  furosemide (LASIX) 20 MG tablet Take 20 mg by mouth daily.     [provider]  gabapentin (NEURONTIN) 400 MG capsule Take 400 mg by mouth.    [provider]  hydrALAZINE (APRESOLINE) 25 MG tablet Take 25 mg by mouth 3 (three) times daily.  08/05/18  [provider]  levothyroxine (SYNTHROID) 112 MCG tablet Take 112 mcg by mouth daily before breakfast.    [provider]  LOKELMA 5 g packet TAKE 5 GRAMS BY MOUTH TWO TIMES A WEEK Patient taking differently: Take 5 g by mouth 2 (two) times a week. 02/13/20   Fayrene Helper, MD  melatonin 3 MG TABS tablet Take 9 mg by mouth at bedtime.    [provider]  Nebivolol HCl 20 MG TABS Take 20 mg by mouth daily.    [provider]  pantoprazole (PROTONIX) 40 MG tablet Take 1 tablet (40 mg total) by mouth daily before supper. 09/18/18   Kayleen Memos, DO  polycarbophil (FIBERCON) 625 MG tablet Take 1 tablet (625 mg total) by mouth daily. 04/21/16   [name], MD  polyethylene glycol (MIRALAX / GLYCOLAX) 17 g packet Take 17 g by mouth 2 (two) times daily. 09/18/18   Kayleen Memos, DO  prochlorperazine (COMPAZINE) 10 MG tablet TAKE ONE TABLET BY MOUTH EVERY 6 HOURS AS NEEDED FOR NAUSSEA AND VOMITING Patient taking differently: Take 10 mg by mouth every 6 (six) hours as needed for nausea or vomiting. 12/13/19   Wyatt Portela, MD  sodium bicarbonate 650 MG tablet Take 650 mg by mouth  2 (two) times daily.    [provider]  sodium zirconium cyclosilicate (LOKELMA) 10 g PACK packet Take 10 g by mouth.    [provider]  tamsulosin (FLOMAX) 0.4 MG CAPS capsule Take 1 capsule by mouth every day 10/10/20   Cleon Gustin, MD  venlafaxine XR (EFFEXOR-XR) 150 MG 24 hr capsule Take 150 mg by mouth every morning. For depression/anxiety 02/29/20   [provider]  verapamil (CALAN) 80 MG tablet Take 80 mg by mouth 3 (three) times daily.     [provider]    Family History Family History  Problem Relation Age of Onset   Cancer Mother        NOS   Alcohol abuse Father    Alcohol abuse Brother 66   Cancer Paternal Aunt        NOS   Colon cancer Neg Hx     Social History Social History   Tobacco Use   Smoking status: Former    Packs/day: 1.50    Years: 30.00    Pack years: 45.00    Types: Cigarettes    Quit date: 04/13/2000    Years since quitting: 20.5   Smokeless tobacco: Never  Vaping Use   Vaping Use: Never used  Substance Use Topics   Alcohol use: Not Currently    Alcohol/week: 1.0 standard drink    Types: 1 Cans of beer per week    Comment: a beer about once a week, less than in his youth   Drug use: No     Allergies   Lisinopril   Review of Systems Review of Systems  Constitutional: Negative.   Respiratory: Negative.    Cardiovascular: Negative.   Musculoskeletal:  Positive for arthralgias and back pain. Negative for gait problem, joint swelling, myalgias, neck pain and neck stiffness.  Skin: Negative.   Neurological: Negative.     Physical Exam Triage Vital Signs ED Triage Vitals  Enc Vitals Group     BP 11/06/20 1520 (!) 165/87     Pulse Rate 11/06/20 1520 76     Resp 11/06/20 1520 17     Temp 11/06/20 1520 98.2 F (36.8 C)  Temp Source 11/06/20 1520 Oral     SpO2 11/06/20 1520 94 %     Weight --      Height --      Head Circumference --      Peak Flow --      Pain Score 11/06/20 1524 4      Pain Loc --      Pain Edu? --      Excl. in [place]? --    No data found.  Updated Vital Signs BP (!) 165/87 (BP Location: Left Arm)   Pulse 76   Temp 98.2 F (36.8 C) (Oral)   Resp 17   SpO2 94%   Visual Acuity Right Eye Distance:   Left Eye Distance:   Bilateral Distance:    Right Eye Near:   Left Eye Near:    Bilateral Near:     Physical Exam Constitutional:      Appearance: Normal appearance. He is normal weight.  HENT:     Head: Normocephalic.  Eyes:     Extraocular Movements: Extraocular movements intact.  Musculoskeletal:     Comments: Diffuse tenderness on the lateral aspect of the left shoulder and along the left trapezius muscle, range of motion intact, no crepitus, spasms, edema, erythema noted, negative Hawkins sign  Skin:    General: Skin is warm and dry.  Neurological:     Mental Status: He is alert and oriented to person, place, and time. Mental status is at baseline.  Psychiatric:        Mood and Affect: Mood normal.        Behavior: Behavior normal.     UC Treatments / Results  Labs (all labs ordered are listed, but only abnormal results are displayed) Labs Reviewed - No data to display  EKG   Radiology No results found.  Procedures Procedures (including critical care time)  Medications Ordered in UC Medications - No data to display  Initial Impression / Assessment and Plan / UC Course  I have reviewed the triage vital signs and the nursing notes.  Pertinent labs & imaging results that were available during my care of the patient were reviewed by me and considered in my medical decision making (see chart for details).  Pain in the left shoulder  Most likely muscle irritation, discussed with patient, will manage conservatively and defer x-ray at this time due to lack of injury, patient in agreement with plan of care  1.  Diclofenac 1% 4 times daily as needed\ 2.  Lidocaine gel 5% 4 times daily as needed 3.  Heating pad in 15-minute  intervals pillows for support, daily stretching recommended 4.  Follow-up with orthopedic as needed Final Clinical Impressions(s) / UC Diagnoses   Final diagnoses:  Acute pain of left shoulder     Discharge Instructions      Your pain is most likely caused by irritation to the muscles or ligaments.   You can rub diclofenac cream onto area four times a day  Can rub lidocaine cream onto area four times a day in addition to diclofenac gel   You may use heating pad in 15 minute intervals as needed for additional comfort, within the first 2-3 days you may find comfort in using ice in 10-15 minutes over affected area  Begin stretching affected area daily for 10 minutes as tolerated to further loosen muscles   When lying down place pillow underneath and between knees for support  Can try sleeping without pillow on firm  mattress   Practice good posture: head back, shoulders back, chest forward, pelvis back and weight distributed evenly on both legs  If pain persist after recommended treatment or reoccurs if may be beneficial to follow up with orthopedic specialist for evaluation, this doctor specializes in the bones and can manage your symptoms long-term with options such as but not limited to imaging, medications or physical therapy      ED Prescriptions     Medication Sig Dispense Auth. Provider   diclofenac Sodium (VOLTAREN) 1 % GEL Apply 2 g topically 4 (four) times daily. 100 g Azani Brogdon R, NP   lidocaine (XYLOCAINE) 5 % ointment Apply 1 application topically as needed. 35.44 g Hans Eden, NP      PDMP not reviewed this encounter.   Hans Eden, NP 11/08/20 1126

## 2020-11-14 ENCOUNTER — Other Ambulatory Visit: Payer: Self-pay | Admitting: Family Medicine

## 2020-11-14 ENCOUNTER — Ambulatory Visit
Admission: RE | Admit: 2020-11-14 | Discharge: 2020-11-14 | Disposition: A | Discharge status: Home / Self Care | Payer: Medicare Other | Source: Ambulatory Visit | Attending: Family Medicine | Admitting: Family Medicine

## 2020-11-14 DIAGNOSIS — M79602 Pain in left arm: Secondary | ICD-10-CM

## 2020-11-14 DIAGNOSIS — R0789 Other chest pain: Secondary | ICD-10-CM

## 2020-11-14 IMAGING — CR DG CHEST 2V
2 series · 2 of 2 positions shown · non-contrast
Comparison: [DATE].

CLINICAL DATA: LEFT-sided chest pain and arm pain with productive
cough and shortness of breath for 1 week.

EXAM:
CHEST - 2 VIEW

[w chest pa]
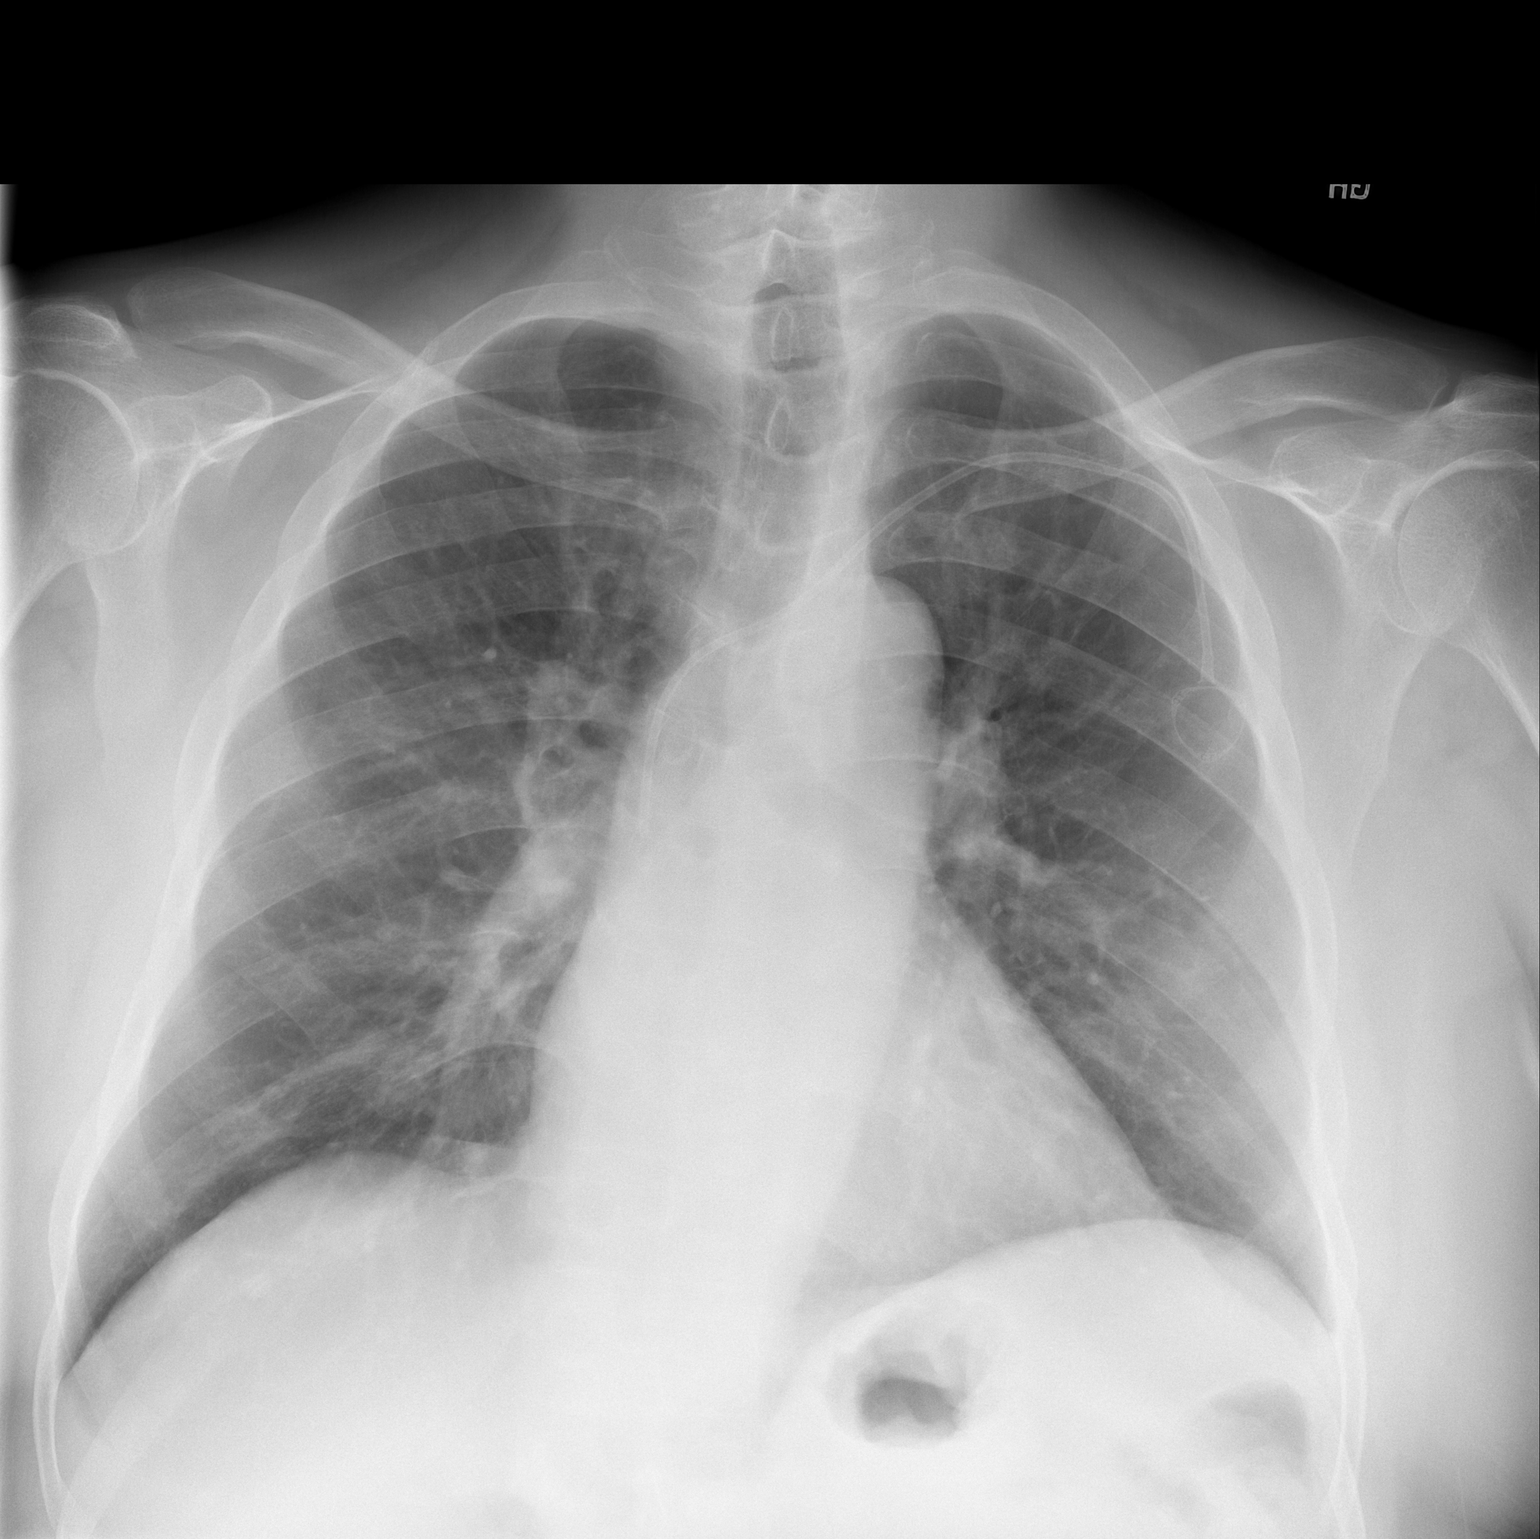

[w chest lat]
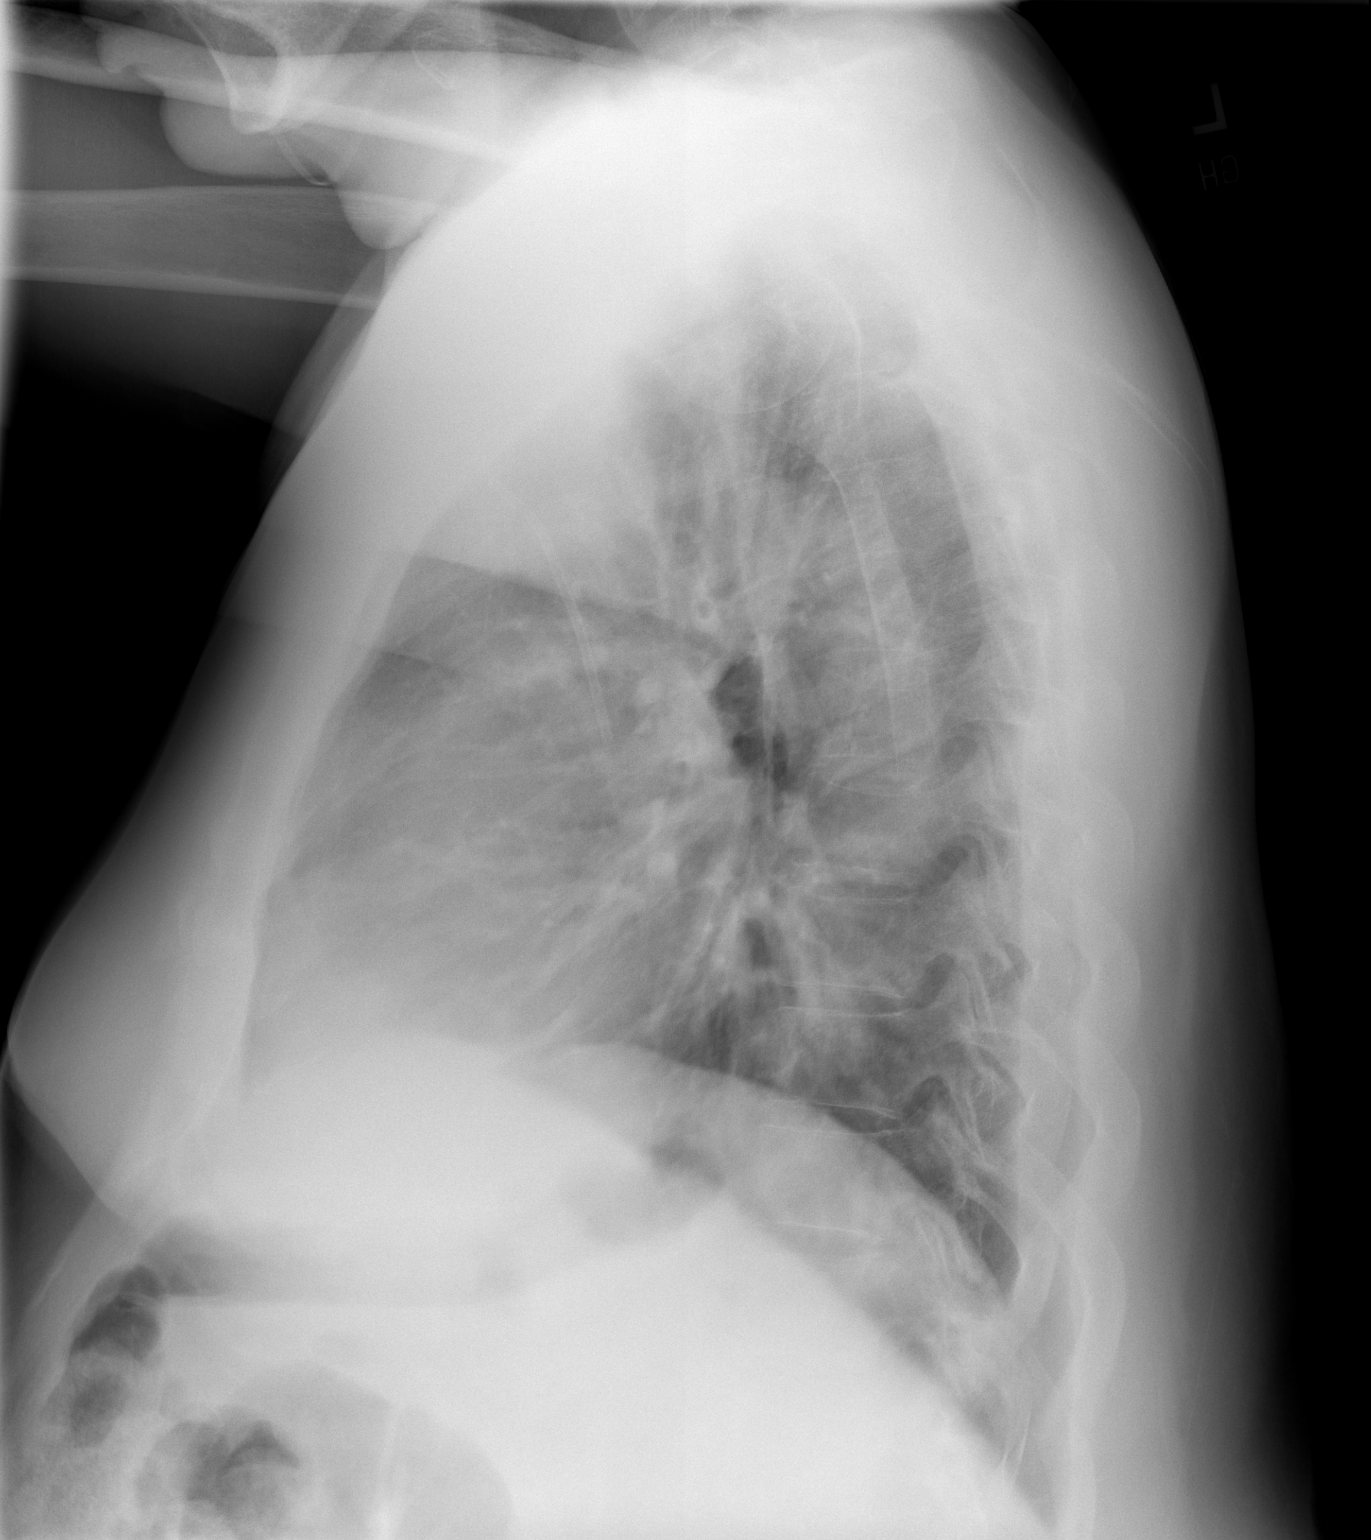

[2 of 2 positions shown; findings below may reference images not displayed]

FINDINGS: LEFT-sided Port-A-Cath again terminating at the distal aspect of the
superior vena cava. Cardiomediastinal contours and hilar structures
are stable. Lungs are clear. No effusion. No consolidation.

On limited assessment there is no sign of acute skeletal process.
IMPRESSION: No active cardiopulmonary disease.

## 2020-11-23 ENCOUNTER — Ambulatory Visit: Payer: Medicare Other | Admitting: Cardiology

## 2020-11-23 ENCOUNTER — Other Ambulatory Visit: Payer: Self-pay

## 2020-11-23 ENCOUNTER — Encounter: Payer: Self-pay | Admitting: Cardiology

## 2020-11-23 VITALS — BP 148/95 | HR 79 | Ht 74.0 in | Wt 248.4 lb

## 2020-11-23 DIAGNOSIS — R42 Dizziness and giddiness: Secondary | ICD-10-CM | POA: Insufficient documentation

## 2020-11-23 DIAGNOSIS — R0602 Shortness of breath: Secondary | ICD-10-CM | POA: Diagnosis not present

## 2020-11-23 DIAGNOSIS — R011 Cardiac murmur, unspecified: Secondary | ICD-10-CM | POA: Insufficient documentation

## 2020-11-23 DIAGNOSIS — I10 Essential (primary) hypertension: Secondary | ICD-10-CM | POA: Diagnosis not present

## 2020-11-23 DIAGNOSIS — R0789 Other chest pain: Secondary | ICD-10-CM

## 2020-11-23 NOTE — Progress Notes (Signed)
Cardiology Office Note:    Date:  11/23/2020   ID:  Jacob [name], DOB [birth year], MRN [id number]  PCP:  [name], MD  Cardiologist:  Berniece Salines, DO  Electrophysiologist:  None   Referring MD: [name], MD   " I am having chest discomfort"  History of Present Illness:    Jacob [name] is a 75 y.o. male with a hx of hypertension, obesity, history of stomach cancer, chronic kidney disease and gout is here today.  Today to be evaluated for chest discomfort.  The patient tells me over the last several months he has had intermittent chest pain.  He described it as midsternal sensation which started mid chest and radiates to the back.  Nothing makes it better or worse.  He has has associated shortness of breath and his.  He is concerned because the episodes is becoming frequent.  Past Medical History:  Diagnosis Date   Anemia    hx iron deficiency   Anxiety    Arthritis    gout   Back pain    BPH with obstruction/lower urinary tract symptoms    Chronic kidney disease    "they said I do"   Constipation    Depression    ED (erectile dysfunction)    Epigastric pain    GERD (gastroesophageal reflux disease)    Heartburn    History of blood transfusion    History of radiation therapy 11/03/11-12/29/11   prostate   Hypercholesterolemia    Hypertension    Night sweats    Oxygen deficiency    2x per week at night for sleep   Post-operative nausea and vomiting 04/09/2016   Prostate cancer ([place]) 08/14/11   Adenocarcinoma,gleason:3+3=6,& 3+4=7,PSA=5.66   PUD (peptic ulcer disease)    Sleep apnea    does not use Cpap but does use O2 @ 2l    Stomach cancer ([place])    Ulcer    peptic ulcer hx    Past Surgical History:  Procedure Laterality Date   colon polyps bx  11/24/06   colon,transverse and rectosigmoid polyps:tubular adenomas and hyperplastic polyps,no high grade dysplasia or malignancy    COLONOSCOPY WITH PROPOFOL N/A 08/24/2019   Procedure:  COLONOSCOPY WITH PROPOFOL;  Surgeon: [name], DO;  Location: [facility];  Service: Gastroenterology;  Laterality: N/A;   duodenal bx  11/24/06   benign   ESOPHAGOGASTRODUODENOSCOPY N/A 10/27/2015   Procedure: ESOPHAGOGASTRODUODENOSCOPY (EGD);  Surgeon: [name], MD;  Location: [facility];  Service: Endoscopy;  Laterality: N/A;   EUS N/A 11/08/2015   Procedure: UPPER ENDOSCOPIC ULTRASOUND (EUS) RADIAL;  Surgeon: [name], MD;  Location: [facility];  Service: Endoscopy;  Laterality: N/A;   GASTRECTOMY N/A 03/25/2016   Procedure: DISTAL GASTRECTOMY;  Surgeon: [name], MD;  Location: [place];  Service: General;  Laterality: N/A;   gastric bx  11/24/06   chronic active gastritis,with metaplasia and focal changaes of xanthelasma   GASTROSTOMY N/A 03/25/2016   Procedure: INSERTION OF FEEDING TUBE;  Surgeon: [name], MD;  Location: [place];  Service: General;  Laterality: N/A;   INSERTION PROSTATE RADIATION SEED  12-29-11   IR GENERIC HISTORICAL  04/01/2016   IR [place] TUBE CHANGE 04/01/2016 Markus Daft, MD [facility]   IR GENERIC HISTORICAL  04/05/2016   IR [place] TUBE CHANGE 04/05/2016 Markus Daft, MD [facility]   LAPAROSCOPIC GASTRECTOMY  03/25/2016   Diagnostic laparoscopy, distal gastrectomy with Billroth 2 reconstruction and gastrojejunostomy tube  LAPAROSCOPY N/A 03/25/2016   Procedure: DIAGNOSTIC LAPAROSCOPY;  Surgeon: [name], MD;  Location: [place];  Service: General;  Laterality: N/A;   POLYPECTOMY  08/24/2019   Procedure: POLYPECTOMY;  Surgeon: [name], DO;  Location: [facility];  Service: Gastroenterology;;   PORTACATH PLACEMENT Left 12/04/2015   Procedure: INSERTION PORT-A-CATH;  Surgeon: [name], MD;  Location: [place];  Service: General;  Laterality: Left;   PROSTATE BIOPSY  08/14/11   Adenocarcinoma/volume=58.51cc,gleason=3+3=6 & 3+4=7   TONSILLECTOMY     75 years old    Current Medications: Current Meds  Medication Sig    acetaminophen (TYLENOL) 500 MG tablet Take 1,000 mg by mouth daily as needed for moderate pain or headache.   allopurinol (ZYLOPRIM) 300 MG tablet Take 150 mg by mouth daily.   buPROPion (WELLBUTRIN XL) 300 MG 24 hr tablet Take 300 mg by mouth daily.    busPIRone (BUSPAR) 10 MG tablet Take 10 mg by mouth.   calcium carbonate (TUMS - DOSED IN MG ELEMENTAL CALCIUM) 500 MG chewable tablet Chew 3 tablets by mouth 2 (two) times daily as needed for indigestion or heartburn.   diclofenac Sodium (VOLTAREN) 1 % GEL Apply 2 g topically 4 (four) times daily.   donepezil (ARICEPT) 5 MG tablet Take 1 tablet (5 mg total) by mouth at bedtime.   furosemide (LASIX) 20 MG tablet Take 20 mg by mouth daily.    gabapentin (NEURONTIN) 400 MG capsule Take 400 mg by mouth.   hydrALAZINE (APRESOLINE) 25 MG tablet Take 25 mg by mouth 3 (three) times daily.    lidocaine (XYLOCAINE) 5 % ointment Apply 1 application topically as needed.   LOKELMA 5 g packet TAKE 5 GRAMS BY MOUTH TWO TIMES A WEEK (Patient taking differently: Take 5 g by mouth 2 (two) times a week.)   melatonin 3 MG TABS tablet Take 9 mg by mouth at bedtime.   Nebivolol HCl 20 MG TABS Take 20 mg by mouth daily.   pantoprazole (PROTONIX) 40 MG tablet Take 1 tablet (40 mg total) by mouth daily before supper.   polyethylene glycol (MIRALAX / GLYCOLAX) 17 g packet Take 17 g by mouth 2 (two) times daily.   sodium zirconium cyclosilicate (LOKELMA) 10 g PACK packet Take 10 g by mouth.   tamsulosin (FLOMAX) 0.4 MG CAPS capsule Take 1 capsule by mouth every day   venlafaxine XR (EFFEXOR-XR) 150 MG 24 hr capsule Take 150 mg by mouth every morning. For depression/anxiety   verapamil (CALAN) 80 MG tablet Take 80 mg by mouth 3 (three) times daily.      Allergies:   Lisinopril   Social History   Socioeconomic History   Marital status: Married    Spouse name: [name]   Number of children: 3   Years of education: 12   Highest education level: Not on file   Occupational History   Occupation: retired    Fish farm manager: [employer]  Tobacco Use   Smoking status: Former    Packs/day: 1.50    Years: 30.00    Pack years: 45.00    Types: Cigarettes    Quit date: 04/13/2000    Years since quitting: 20.6   Smokeless tobacco: Never  Vaping Use   Vaping Use: Never used  Substance and Sexual Activity   Alcohol use: Not Currently    Alcohol/week: 1.0 standard drink    Types: 1 Cans of beer per week    Comment: a beer about once a week, less than in  his youth   Drug use: No   Sexual activity: Not Currently  Other Topics Concern   Not on file  Social History Narrative   Patient is married [name]) and lives at home with his wife and one child.   Patient has three children.   Patient is retired.   Patient has a high school education.   Patient is ambi-dextrous.   Patient drinks two cups of coffee about three times a week.    Social Determinants of Health   Financial Resource Strain: Not on file  Food Insecurity: Not on file  Transportation Needs: Not on file  Physical Activity: Not on file  Stress: Not on file  Social Connections: Not on file     Family History: The patient's family history includes Alcohol abuse in his father; Alcohol abuse (age of onset: 79) in his brother; Cancer in his mother and paternal aunt. There is no history of Colon cancer.  ROS:   Review of Systems  Constitution: Negative for decreased appetite, fever and weight gain.  HENT: Negative for congestion, ear discharge, hoarse voice and sore throat.   Eyes: Negative for discharge, redness, vision loss in right eye and visual halos.  Cardiovascular: Reports chest pain.  Negative for dyspnea on exertion, leg swelling, orthopnea and palpitations.  Respiratory: Negative for cough, hemoptysis, shortness of breath and snoring.   Endocrine: Negative for heat intolerance and polyphagia.  Hematologic/Lymphatic: Negative for bleeding problem. Does not bruise/bleed easily.   Skin: Negative for flushing, nail changes, rash and suspicious lesions.  Musculoskeletal: Negative for arthritis, joint pain, muscle cramps, myalgias, neck pain and stiffness.  Gastrointestinal: Negative for abdominal pain, bowel incontinence, diarrhea and excessive appetite.  Genitourinary: Negative for decreased libido, genital sores and incomplete emptying.  Neurological: Negative for brief paralysis, focal weakness, headaches and loss of balance.  Psychiatric/Behavioral: Negative for altered mental status, depression and suicidal ideas.  Allergic/Immunologic: Negative for HIV exposure and persistent infections.    EKGs/Labs/Other Studies Reviewed:    The following studies were reviewed today:   EKG:  The ekg ordered today demonstrates sinus rhythm, heart rate 79 bpm.  Recent Labs: 12/22/2019: Magnesium 1.6 06/27/2020: ALT 17; BUN 45; Creatinine 2.97; Hemoglobin 8.9; Platelet Count 172; Potassium 5.7; Sodium 140  Recent Lipid Panel No results found for: CHOL, TRIG, HDL, CHOLHDL, VLDL, LDLCALC, LDLDIRECT  Physical Exam:    VS:  BP (!) 148/95 (BP Location: Left Arm)   Pulse 79   Ht 6\' 2"  (1.88 m)   Wt 248 lb 6.4 oz (112.7 kg)   SpO2 99%   BMI 31.89 kg/m     Wt Readings from Last 3 Encounters:  11/23/20 248 lb 6.4 oz (112.7 kg)  08/13/20 237 lb (107.5 kg)  05/11/20 228 lb (103.4 kg)     GEN: Well nourished, well developed in no acute distress HEENT: Normal NECK: No JVD; No carotid bruits LYMPHATICS: No lymphadenopathy CARDIAC: S1S2 noted,RRR, 2 out of 6 mid ejection systolic murmurs, rubs, gallops RESPIRATORY:  Clear to auscultation without rales, wheezing or rhonchi  ABDOMEN: Soft, non-tender, non-distended, +bowel sounds, no guarding. EXTREMITIES: No edema, No cyanosis, no clubbing MUSCULOSKELETAL:  No deformity  SKIN: Warm and dry NEUROLOGIC:  Alert and oriented x 3, non-focal PSYCHIATRIC:  Normal affect, good insight  ASSESSMENT:    1. Hypertension,  unspecified type   2. Other chest pain   3. Murmur   4. Dizziness   5. SOB (shortness of breath)    PLAN:    His  chest pain is concerning given his risk factor.  I spoke to the patient about getting pharmacologic nuclear stress test he is agreeable to this.  His clinical exam revealed midsystolic ejection fraction concerning for aortic stenosis or sclerosis follows again echocardiogram done.  I discussed with the patient we will get the echocardiogram done first prior to the nuclear stress test.  Should he have significant valve disease at that time we will cancel the nuclear stress test and move forward with other means of ischemic evaluation.  His blood pressure is slightly elevated in the office but he has had some dizziness out of hold off on increasing his antihypertensive regimen for now.  The patient understands the need to lose weight with diet and exercise. We have discussed specific strategies for this.  The patient is in agreement with the above plan. The patient left the office in stable condition.  The patient will follow up in 4 months or sooner if needed.   Medication Adjustments/Labs and Tests Ordered: Current medicines are reviewed at length with the patient today.  Concerns regarding medicines are outlined above.  Orders Placed This Encounter  Procedures   Cardiac Stress Test: Informed Consent Details: Physician/Practitioner Attestation; Transcribe to consent form and obtain patient signature   MYOCARDIAL PERFUSION IMAGING   EKG 12-Lead   ECHOCARDIOGRAM COMPLETE   No orders of the defined types were placed in this encounter.   Patient Instructions  Medication Instructions:  Your physician recommends that you continue on your current medications as directed. Please refer to the Current Medication list given to you today.  *If you need a refill on your cardiac medications before your next appointment, please call your pharmacy*   Lab Work: None If you have labs  (blood work) drawn today and your tests are completely normal, you will receive your results only by: [place] (if you have MyChart) OR A paper copy in the mail If you have any lab test that is abnormal or we need to change your treatment, we will call you to review the results.   Testing/Procedures: Your physician has requested that you have an echocardiogram. Echocardiography is a painless test that uses sound waves to create images of your heart. It provides your doctor with information about the size and shape of your heart and how well your heart's chambers and valves are working. This procedure takes approximately one hour. There are no restrictions for this procedure.   Your physician has requested that you have a lexiscan myoview. For further information please visit [email or web address]. Please follow instruction sheet, as given.   The test will take approximately 3 to 4 hours to complete; you may bring reading material.  If someone comes with you to your appointment, they will need to remain in the main lobby due to limited space in the testing area. **If you are pregnant or breastfeeding, please notify the nuclear lab prior to your appointment**  How to prepare for your Myocardial Perfusion Test: Do not eat or drink 3 hours prior to your test, except you may have water. Do not consume products containing caffeine (regular or decaffeinated) 12 hours prior to your test. (ex: coffee, chocolate, sodas, tea). Do bring a list of your current medications with you.  If not listed below, you may take your medications as normal. Do wear comfortable clothes (no dresses or overalls) and walking shoes, tennis shoes preferred (No heels or open toe shoes are allowed). Do NOT wear cologne, perfume, aftershave, or lotions (deodorant is  allowed). If these instructions are not followed, your test will have to be rescheduled.    Follow-Up: At [facility], you and your health needs are our  priority.  As part of our continuing mission to provide you with exceptional heart care, we have created designated Provider Care Teams.  These Care Teams include your primary Cardiologist (physician) and Advanced Practice Providers (APPs -  Physician Assistants and Nurse Practitioners) who all work together to provide you with the care you need, when you need it.  We recommend signing up for the patient portal called "MyChart".  Sign up information is provided on this After Visit Summary.  MyChart is used to connect with patients for Virtual Visits (Telemedicine).  Patients are able to view lab/test results, encounter notes, upcoming appointments, etc.  Non-urgent messages can be sent to your provider as well.   To learn more about what you can do with MyChart, go to [email or web address].    Your next appointment:   4 month(s)  The format for your next appointment:   In Person  Provider:   Berniece Salines, DO    Other Instructions     Adopting a Healthy Lifestyle.  Know what a healthy weight is for you (roughly BMI <25) and aim to maintain this   Aim for 7+ servings of fruits and vegetables daily   65-80+ fluid ounces of water or unsweet tea for healthy kidneys   Limit to max 1 drink of alcohol per day; avoid smoking/tobacco   Limit animal fats in diet for cholesterol and heart health - choose grass fed whenever available   Avoid highly processed foods, and foods high in saturated/trans fats   Aim for low stress - take time to unwind and care for your mental health   Aim for 150 min of moderate intensity exercise weekly for heart health, and weights twice weekly for bone health   Aim for 7-9 hours of sleep daily   When it comes to diets, agreement about the perfect plan isnt easy to find, even among the experts. Experts at the [employer] developed an idea known as the Healthy Eating Plate. Just imagine a plate divided into logical, healthy portions.    The emphasis is on diet quality:   Load up on vegetables and fruits - one-half of your plate: Aim for color and variety, and remember that potatoes dont count.   Go for whole grains - one-quarter of your plate: Whole wheat, barley, wheat berries, quinoa, oats, brown rice, and foods made with them. If you want pasta, go with whole wheat pasta.   Protein power - one-quarter of your plate: Fish, chicken, beans, and nuts are all healthy, versatile protein sources. Limit red meat.   The diet, however, does go beyond the plate, offering a few other suggestions.   Use healthy plant oils, such as olive, canola, soy, corn, sunflower and peanut. Check the labels, and avoid partially hydrogenated oil, which have unhealthy trans fats.   If youre thirsty, drink water. Coffee and tea are good in moderation, but skip sugary drinks and limit milk and dairy products to one or two daily servings.   The type of carbohydrate in the diet is more important than the amount. Some sources of carbohydrates, such as vegetables, fruits, whole grains, and beans-are healthier than others.   Finally, stay active  Signed, Berniece Salines, DO  11/23/2020 1:44 PM    [facility]

## 2020-11-23 NOTE — Patient Instructions (Signed)
Medication Instructions:  Your physician recommends that you continue on your current medications as directed. Please refer to the Current Medication list given to you today.  *If you need a refill on your cardiac medications before your next appointment, please call your pharmacy*   Lab Work: None If you have labs (blood work) drawn today and your tests are completely normal, you will receive your results only by: [place] (if you have MyChart) OR A paper copy in the mail If you have any lab test that is abnormal or we need to change your treatment, we will call you to review the results.   Testing/Procedures: Your physician has requested that you have an echocardiogram. Echocardiography is a painless test that uses sound waves to create images of your heart. It provides your doctor with information about the size and shape of your heart and how well your heart's chambers and valves are working. This procedure takes approximately one hour. There are no restrictions for this procedure.   Your physician has requested that you have a lexiscan myoview. For further information please visit [email or web address]. Please follow instruction sheet, as given.   The test will take approximately 3 to 4 hours to complete; you may bring reading material.  If someone comes with you to your appointment, they will need to remain in the main lobby due to limited space in the testing area. **If you are pregnant or breastfeeding, please notify the nuclear lab prior to your appointment**  How to prepare for your Myocardial Perfusion Test: Do not eat or drink 3 hours prior to your test, except you may have water. Do not consume products containing caffeine (regular or decaffeinated) 12 hours prior to your test. (ex: coffee, chocolate, sodas, tea). Do bring a list of your current medications with you.  If not listed below, you may take your medications as normal. Do wear comfortable clothes (no dresses or  overalls) and walking shoes, tennis shoes preferred (No heels or open toe shoes are allowed). Do NOT wear cologne, perfume, aftershave, or lotions (deodorant is allowed). If these instructions are not followed, your test will have to be rescheduled.    Follow-Up: At [facility], you and your health needs are our priority.  As part of our continuing mission to provide you with exceptional heart care, we have created designated Provider Care Teams.  These Care Teams include your primary Cardiologist (physician) and Advanced Practice Providers (APPs -  Physician Assistants and Nurse Practitioners) who all work together to provide you with the care you need, when you need it.  We recommend signing up for the patient portal called "MyChart".  Sign up information is provided on this After Visit Summary.  MyChart is used to connect with patients for Virtual Visits (Telemedicine).  Patients are able to view lab/test results, encounter notes, upcoming appointments, etc.  Non-urgent messages can be sent to your provider as well.   To learn more about what you can do with MyChart, go to [email or web address].    Your next appointment:   4 month(s)  The format for your next appointment:   In Person  Provider:   Berniece Salines, DO    Other Instructions

## 2020-12-18 ENCOUNTER — Telehealth (HOSPITAL_COMMUNITY): Payer: Self-pay | Admitting: *Deleted

## 2020-12-18 NOTE — Telephone Encounter (Signed)
Patient given detailed instructions per Myocardial Perfusion Study Information Sheet for the test on 12/25/20 Patient notified to arrive 15 minutes early and that it is imperative to arrive on time for appointment to keep from having the test rescheduled.  If you need to cancel or reschedule your appointment, please call the office within 24 hours of your appointment. . Patient verbalized understanding. Jacob [name] [name]  

## 2020-12-20 ENCOUNTER — Other Ambulatory Visit: Payer: Medicare Other

## 2020-12-24 ENCOUNTER — Telehealth: Payer: Self-pay

## 2020-12-24 ENCOUNTER — Ambulatory Visit (HOSPITAL_COMMUNITY)
Admission: RE | Admit: 2020-12-24 | Discharge: 2020-12-24 | Disposition: A | Discharge status: Home / Self Care | Payer: Medicare Other | Source: Ambulatory Visit | Attending: Oncology | Admitting: Oncology

## 2020-12-24 ENCOUNTER — Inpatient Hospital Stay: Payer: Medicare Other | Attending: Oncology

## 2020-12-24 ENCOUNTER — Other Ambulatory Visit: Payer: Self-pay

## 2020-12-24 DIAGNOSIS — K5730 Diverticulosis of large intestine without perforation or abscess without bleeding: Secondary | ICD-10-CM | POA: Diagnosis not present

## 2020-12-24 DIAGNOSIS — Z888 Allergy status to other drugs, medicaments and biological substances status: Secondary | ICD-10-CM | POA: Insufficient documentation

## 2020-12-24 DIAGNOSIS — D472 Monoclonal gammopathy: Secondary | ICD-10-CM | POA: Diagnosis present

## 2020-12-24 DIAGNOSIS — D631 Anemia in chronic kidney disease: Secondary | ICD-10-CM | POA: Insufficient documentation

## 2020-12-24 DIAGNOSIS — N189 Chronic kidney disease, unspecified: Secondary | ICD-10-CM | POA: Diagnosis present

## 2020-12-24 DIAGNOSIS — K8020 Calculus of gallbladder without cholecystitis without obstruction: Secondary | ICD-10-CM | POA: Diagnosis not present

## 2020-12-24 DIAGNOSIS — I700 Atherosclerosis of aorta: Secondary | ICD-10-CM | POA: Insufficient documentation

## 2020-12-24 DIAGNOSIS — Z8546 Personal history of malignant neoplasm of prostate: Secondary | ICD-10-CM | POA: Insufficient documentation

## 2020-12-24 DIAGNOSIS — Z79899 Other long term (current) drug therapy: Secondary | ICD-10-CM | POA: Insufficient documentation

## 2020-12-24 DIAGNOSIS — Z923 Personal history of irradiation: Secondary | ICD-10-CM | POA: Insufficient documentation

## 2020-12-24 DIAGNOSIS — R911 Solitary pulmonary nodule: Secondary | ICD-10-CM | POA: Insufficient documentation

## 2020-12-24 DIAGNOSIS — C169 Malignant neoplasm of stomach, unspecified: Secondary | ICD-10-CM | POA: Insufficient documentation

## 2020-12-24 LAB — CBC WITH DIFFERENTIAL (CANCER CENTER ONLY)
Abs Immature Granulocytes: 0.03 10*3/uL (ref 0.00–0.07)
Basophils Absolute: 0 10*3/uL (ref 0.0–0.1)
Basophils Relative: 1 %
Eosinophils Absolute: 0.3 10*3/uL (ref 0.0–0.5)
Eosinophils Relative: 5 %
HCT: 26.5 % — ABNORMAL LOW (ref 39.0–52.0)
Hemoglobin: 8.6 g/dL — ABNORMAL LOW (ref 13.0–17.0)
Immature Granulocytes: 1 %
Lymphocytes Relative: 11 %
Lymphs Abs: 0.6 10*3/uL — ABNORMAL LOW (ref 0.7–4.0)
MCH: 32.6 pg (ref 26.0–34.0)
MCHC: 32.5 g/dL (ref 30.0–36.0)
MCV: 100.4 fL — ABNORMAL HIGH (ref 80.0–100.0)
Monocytes Absolute: 0.9 10*3/uL (ref 0.1–1.0)
Monocytes Relative: 16 %
Neutro Abs: 4 10*3/uL (ref 1.7–7.7)
Neutrophils Relative %: 66 %
Platelet Count: 150 10*3/uL (ref 150–400)
RBC: 2.64 MIL/uL — ABNORMAL LOW (ref 4.22–5.81)
RDW: 15.1 % (ref 11.5–15.5)
WBC Count: 5.9 10*3/uL (ref 4.0–10.5)
nRBC: 0 % (ref 0.0–0.2)

## 2020-12-24 LAB — CMP (CANCER CENTER ONLY)
ALT: 24 U/L (ref 0–44)
AST: 26 U/L (ref 15–41)
Albumin: 3.2 g/dL — ABNORMAL LOW (ref 3.5–5.0)
Alkaline Phosphatase: 53 U/L (ref 38–126)
Anion gap: 8 (ref 5–15)
BUN: 42 mg/dL — ABNORMAL HIGH (ref 8–23)
CO2: 22 mmol/L (ref 22–32)
Calcium: 9.1 mg/dL (ref 8.9–10.3)
Chloride: 108 mmol/L (ref 98–111)
Creatinine: 3.53 mg/dL (ref 0.61–1.24)
GFR, Estimated: 17 mL/min — ABNORMAL LOW (ref >60)
Glucose, Bld: 90 mg/dL (ref 70–99)
Potassium: 5.1 mmol/L (ref 3.5–5.1)
Sodium: 138 mmol/L (ref 135–145)
Total Bilirubin: 0.5 mg/dL (ref 0.3–1.2)
Total Protein: 6.6 g/dL (ref 6.5–8.1)

## 2020-12-24 LAB — IRON AND TIBC
Iron: 78 ug/dL (ref 42–163)
Saturation Ratios: 27 % (ref 20–55)
TIBC: 289 ug/dL (ref 202–409)
UIBC: 211 ug/dL (ref 117–376)

## 2020-12-24 LAB — FERRITIN: Ferritin: 89 ng/mL (ref 24–336)

## 2020-12-24 IMAGING — CT CT CHEST W/O CM
2 of 4 series · 13 of 36 positions shown, 16 images · non-contrast
Comparison: [DATE]

CLINICAL DATA: Gastric cancer.  Restaging.

EXAM:
CT CHEST, ABDOMEN AND PELVIS WITHOUT CONTRAST
TECHNIQUE: Multidetector CT imaging of the chest, abdomen and pelvis was
performed following the standard protocol without IV contrast.

[Series 2: cap w/o · axial · non-contrast · 0.85mm/px · z∈[-254,+291]mm · 10 of 135 slices shown, 13 images]
[im 13/135  mediastinal]
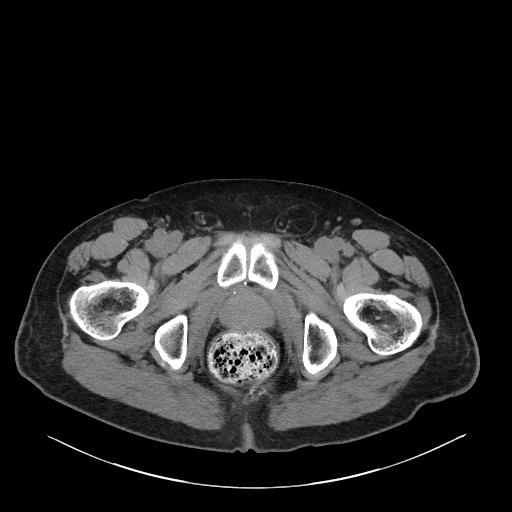
[im 13/135  lung]
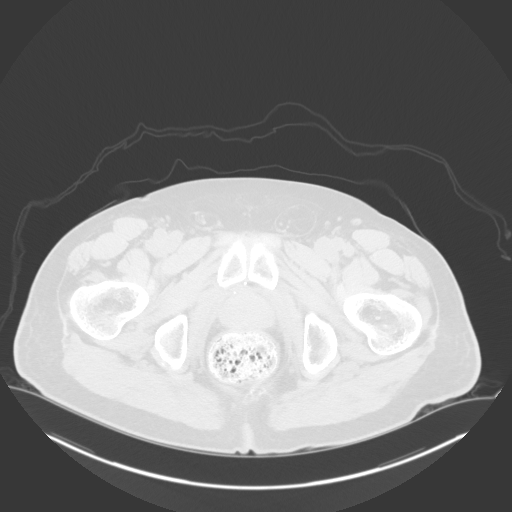
[im 25/135  lung]
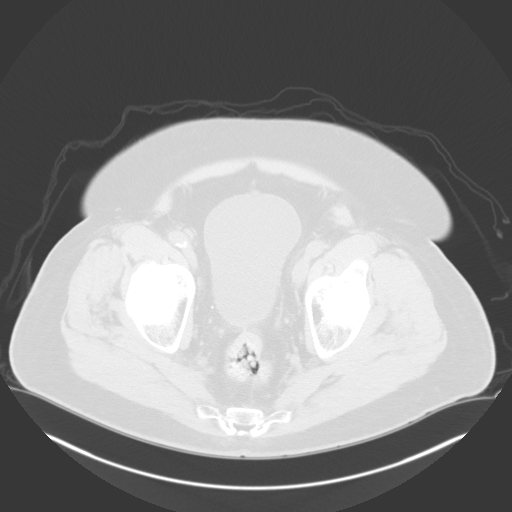
[im 37/135  lung]
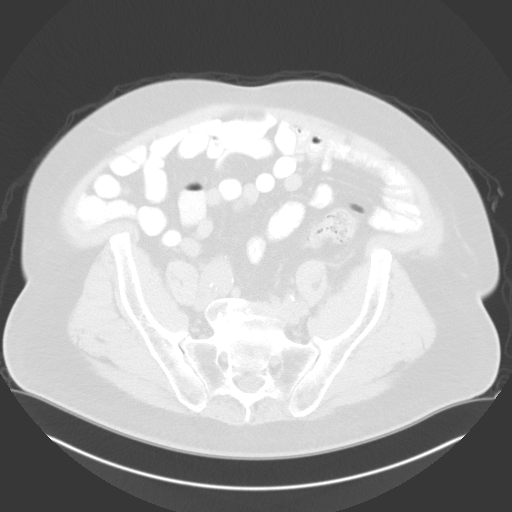
[im 49/135  lung]
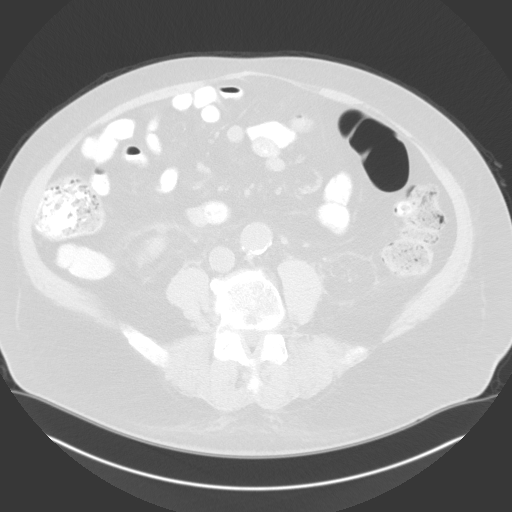
[im 61/135  mediastinal]
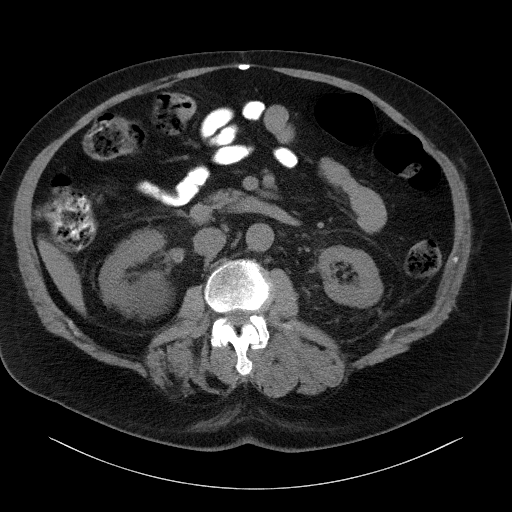
[im 61/135  lung]
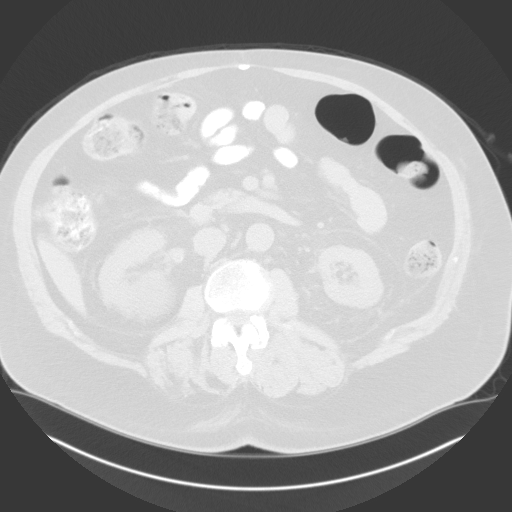
[im 74/135  lung]
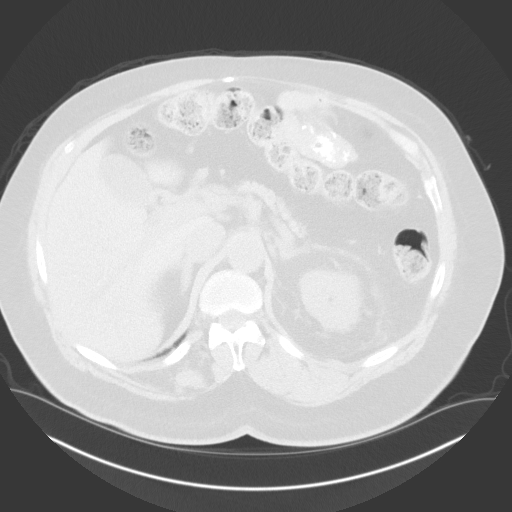
[im 86/135  lung]
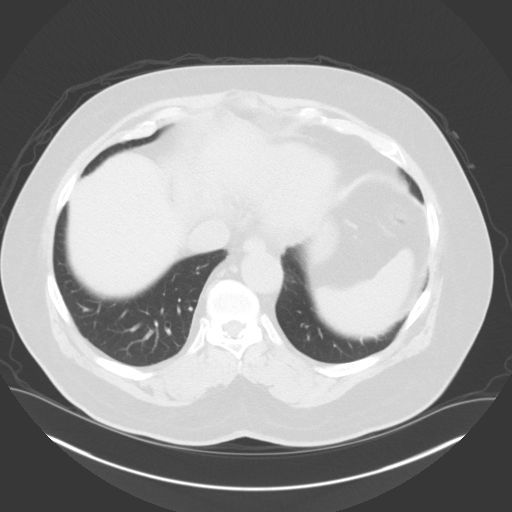
[im 98/135  lung]
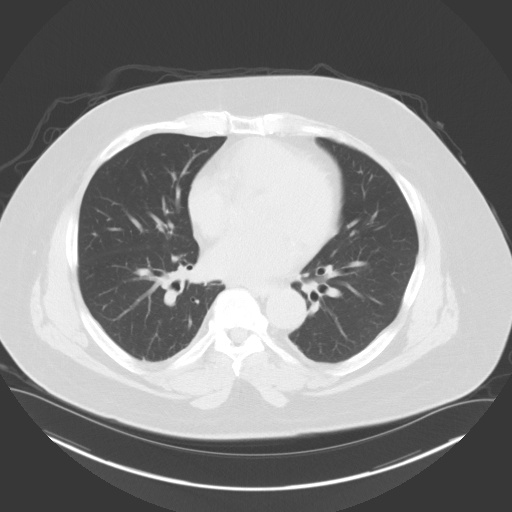
[im 110/135  mediastinal]
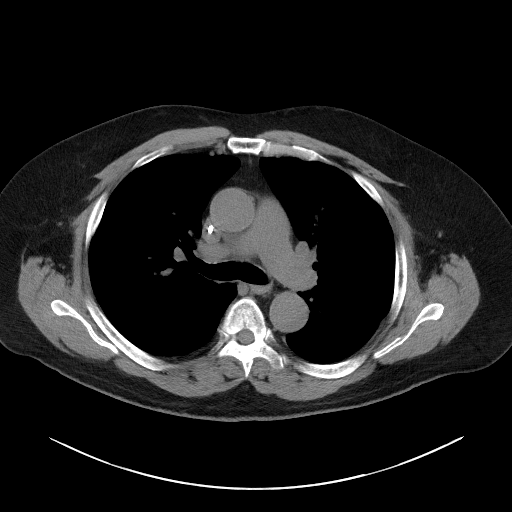
[im 110/135  lung]
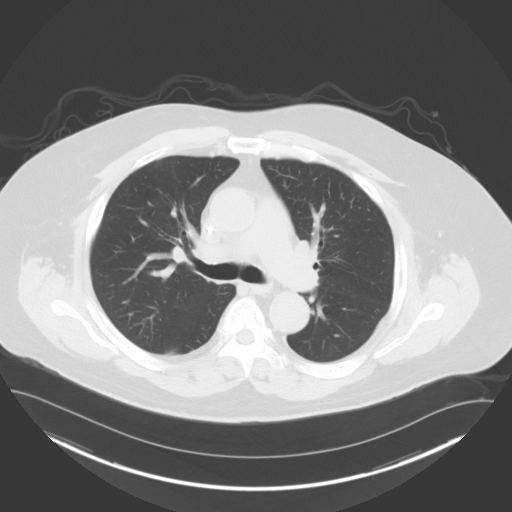
[im 122/135  lung]
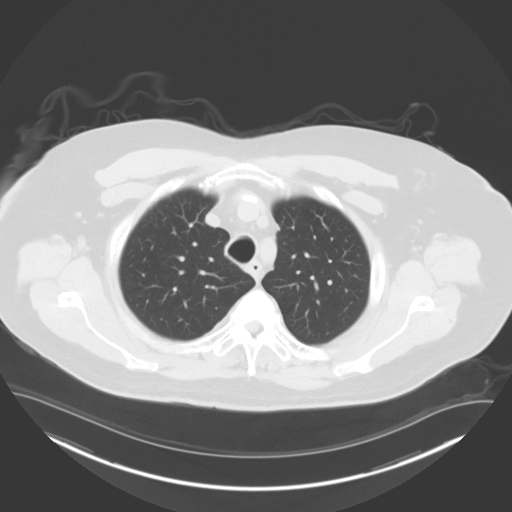

[Series 5: coronals · coronal · 0.85mm/px · 3 of 144 slices shown]
[im 29/144  lung]
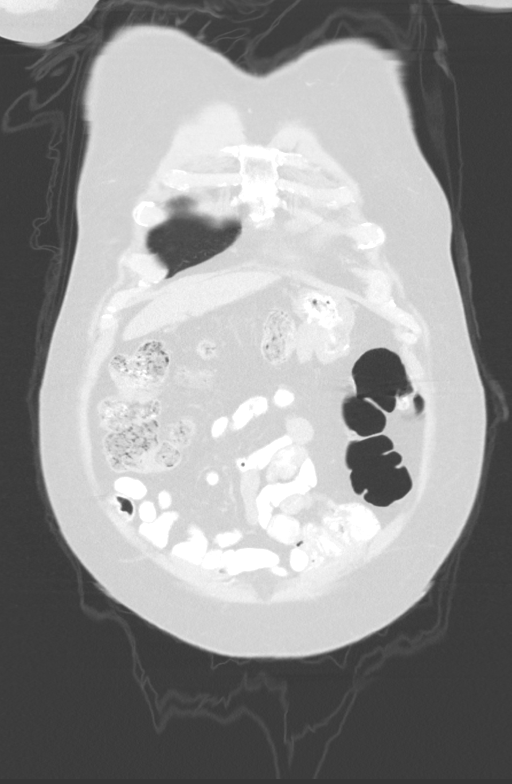
[im 58/144  lung]
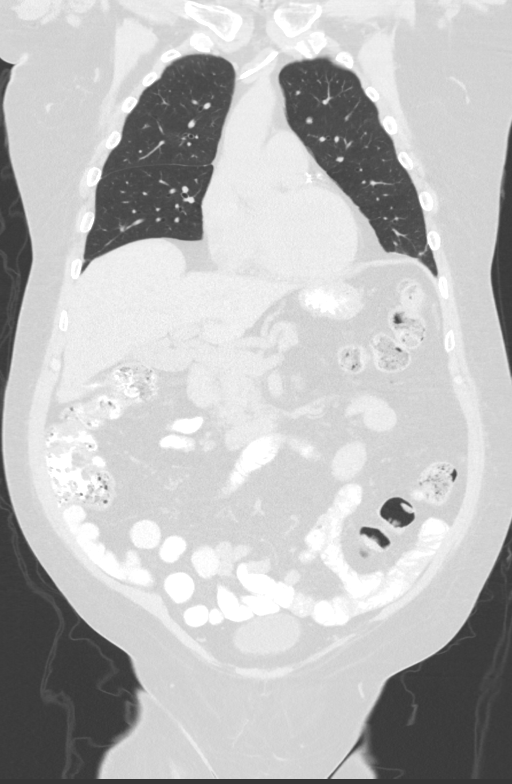
[im 86/144  lung]
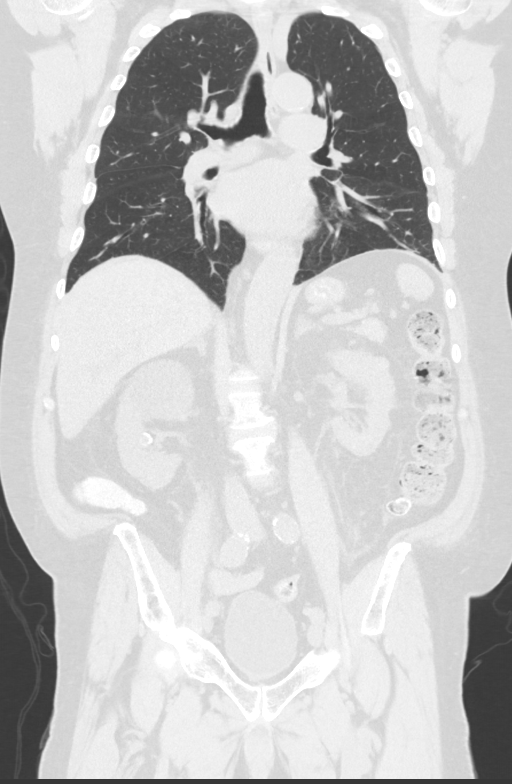

[13 of 36 positions shown; findings below may reference images not displayed]

FINDINGS: CT CHEST FINDINGS

Cardiovascular: The heart size is normal. No substantial pericardial
effusion. Coronary artery calcification is evident. Mild
atherosclerotic calcification is noted in the wall of the thoracic
aorta. Left Port-A-Cath tip is positioned in the mid SVC.

Mediastinum/Nodes: No mediastinal lymphadenopathy. No evidence for
gross hilar lymphadenopathy although assessment is limited by the
lack of intravenous contrast on the current study. The esophagus has
normal imaging features. There is no axillary lymphadenopathy.

Lungs/Pleura: 5 mm nodule medial right upper lobe on [DATE] is new in
the interval 4 mm right upper lobe nodule on 38/4 is stable. No
focal airspace consolidation. No pleural effusion. Subsegmental
atelectasis noted in the lingula and left base. Insert no consults
crash that

Musculoskeletal: No worrisome lytic or sclerotic osseous
abnormality.

CT ABDOMEN PELVIS FINDINGS

Hepatobiliary: No focal abnormality in the liver on this study
without intravenous contrast. Layering tiny calcified gallstones
evident. No intrahepatic or extrahepatic biliary dilation.

Pancreas: No focal mass lesion. No dilatation of the main duct. No
intraparenchymal cyst. No peripancreatic edema.

Spleen: No splenomegaly. No focal mass lesion.

Adrenals/Urinary Tract: No adrenal nodule or mass. 7 cm cyst noted
upper pole right kidney. Rim calcified interpolar lesion in the
right kidney is stable at 16 mm. 13 mm exophytic lesion lower pole
right kidney has attenuation higher than would be expected for a
simple cyst but is unchanged in the interval multiple lesions of
varying size and attenuation are also seen in the left kidney
including stable 10 mm anterior upper interpolar lesion with
attenuation too high to be a simple cyst. No evidence for
hydroureter. The urinary bladder appears normal for the degree of
distention.

Stomach/Bowel: Status post distal gastrectomy. No small bowel wall
thickening. No small bowel dilatation. The terminal ileum is normal.
The appendix is normal. No gross colonic mass. No colonic wall
thickening. Diverticular changes are noted in the left colon without
evidence of diverticulitis.

Vascular/Lymphatic: There is mild atherosclerotic calcification of
the abdominal aorta without aneurysm. There is no gastrohepatic or
hepatoduodenal ligament lymphadenopathy. No retroperitoneal or
mesenteric lymphadenopathy. No pelvic sidewall lymphadenopathy.

Reproductive: Fiducial markers noted prostate gland.

Other: No intraperitoneal free fluid.

Musculoskeletal: No worrisome lytic or sclerotic osseous
abnormality.
IMPRESSION: 1. 5 mm medial right upper lobe pulmonary nodule is new in the
interval. Additional tiny right upper lobe pulmonary nodule is
stable. Attention on follow-up recommended.
2. No evidence for metastatic disease in the abdomen or pelvis.
3. Bilateral renal lesions of varying size and attenuation, likely
simple cysts and cysts complicated by proteinaceous debris or
hemorrhage. These are stable in the interval, but continued
attention on follow-up recommended
4. Cholelithiasis.
5. Left colonic diverticulosis without diverticulitis.
6. Aortic Atherosclerosis ([HU]-[HU]).

## 2020-12-24 NOTE — Telephone Encounter (Signed)
CRITICAL VALUE STICKER  CRITICAL VALUE: creat 1.53  RECEIVER (on-site recipient of call):Leanne Chang RN  DATE & TIME NOTIFIED: 12/12 14:30  MESSENGER (representative from lab): Pam  MD NOTIFIED: ZDr. Julien Nordmann for Dr. Alen Blew  TIME OF NOTIFICATION: 14:30  RESPONSE:  made aware

## 2020-12-25 ENCOUNTER — Ambulatory Visit (HOSPITAL_COMMUNITY): Payer: Medicare Other | Attending: Internal Medicine

## 2020-12-25 ENCOUNTER — Ambulatory Visit (HOSPITAL_BASED_OUTPATIENT_CLINIC_OR_DEPARTMENT_OTHER): Payer: Medicare Other

## 2020-12-25 DIAGNOSIS — R0789 Other chest pain: Secondary | ICD-10-CM | POA: Diagnosis not present

## 2020-12-25 DIAGNOSIS — R0602 Shortness of breath: Secondary | ICD-10-CM | POA: Insufficient documentation

## 2020-12-25 LAB — ECHOCARDIOGRAM COMPLETE
Area-P 1/2: 3.63 cm2
Height: 74 in
S' Lateral: 4.3 cm
Weight: 3968 oz

## 2020-12-25 LAB — MYOCARDIAL PERFUSION IMAGING
LV dias vol: 107 mL (ref 62–150)
LV sys vol: 50 mL
Nuc Stress EF: 53 %
Peak HR: 77 {beats}/min
Rest HR: 70 {beats}/min
Rest Nuclear Isotope Dose: 11 mCi
SDS: 0
SRS: 0
SSS: 4
ST Depression (mm): 0 mm
Stress Nuclear Isotope Dose: 31.3 mCi
TID: 1

## 2020-12-25 LAB — KAPPA/LAMBDA LIGHT CHAINS
Kappa free light chain: 183.4 mg/L — ABNORMAL HIGH (ref 3.3–19.4)
Kappa, lambda light chain ratio: 2.99 — ABNORMAL HIGH (ref 0.26–1.65)
Lambda free light chains: 61.3 mg/L — ABNORMAL HIGH (ref 5.7–26.3)

## 2020-12-25 MED ORDER — TECHNETIUM TC 99M TETROFOSMIN IV KIT
11.0000 | PACK | Freq: Once | INTRAVENOUS | Status: AC | PRN
  Administered 2020-12-25: 11 via INTRAVENOUS
  Filled 2020-12-25: qty 11

## 2020-12-25 MED ORDER — REGADENOSON 0.4 MG/5ML IV SOLN
0.4000 mg | Freq: Once | INTRAVENOUS | Status: AC
  Administered 2020-12-25: 0.4 mg via INTRAVENOUS

## 2020-12-25 MED ORDER — TECHNETIUM TC 99M TETROFOSMIN IV KIT
31.3000 | PACK | Freq: Once | INTRAVENOUS | Status: AC | PRN
  Administered 2020-12-25: 31.3 via INTRAVENOUS
  Filled 2020-12-25: qty 32

## 2020-12-27 ENCOUNTER — Other Ambulatory Visit: Payer: Self-pay

## 2020-12-27 ENCOUNTER — Inpatient Hospital Stay: Payer: Medicare Other | Admitting: Oncology

## 2020-12-27 VITALS — BP 131/78 | HR 79 | Temp 99.0°F | Resp 18 | Ht 74.0 in | Wt 252.2 lb

## 2020-12-27 DIAGNOSIS — N189 Chronic kidney disease, unspecified: Secondary | ICD-10-CM | POA: Diagnosis not present

## 2020-12-27 DIAGNOSIS — D472 Monoclonal gammopathy: Secondary | ICD-10-CM | POA: Diagnosis not present

## 2020-12-27 DIAGNOSIS — Z888 Allergy status to other drugs, medicaments and biological substances status: Secondary | ICD-10-CM | POA: Diagnosis not present

## 2020-12-27 DIAGNOSIS — D631 Anemia in chronic kidney disease: Secondary | ICD-10-CM | POA: Diagnosis not present

## 2020-12-27 DIAGNOSIS — Z8546 Personal history of malignant neoplasm of prostate: Secondary | ICD-10-CM | POA: Diagnosis not present

## 2020-12-27 DIAGNOSIS — C169 Malignant neoplasm of stomach, unspecified: Secondary | ICD-10-CM | POA: Diagnosis not present

## 2020-12-27 DIAGNOSIS — K8020 Calculus of gallbladder without cholecystitis without obstruction: Secondary | ICD-10-CM | POA: Diagnosis not present

## 2020-12-27 DIAGNOSIS — R911 Solitary pulmonary nodule: Secondary | ICD-10-CM | POA: Diagnosis not present

## 2020-12-27 DIAGNOSIS — I700 Atherosclerosis of aorta: Secondary | ICD-10-CM | POA: Diagnosis not present

## 2020-12-27 DIAGNOSIS — Z79899 Other long term (current) drug therapy: Secondary | ICD-10-CM | POA: Diagnosis not present

## 2020-12-27 DIAGNOSIS — Z923 Personal history of irradiation: Secondary | ICD-10-CM | POA: Diagnosis not present

## 2020-12-27 DIAGNOSIS — K5730 Diverticulosis of large intestine without perforation or abscess without bleeding: Secondary | ICD-10-CM | POA: Diagnosis not present

## 2020-12-27 LAB — MULTIPLE MYELOMA PANEL, SERUM
Albumin SerPl Elph-Mcnc: 3.1 g/dL (ref 2.9–4.4)
Albumin/Glob SerPl: 1.1 (ref 0.7–1.7)
Alpha 1: 0.3 g/dL (ref 0.0–0.4)
Alpha2 Glob SerPl Elph-Mcnc: 0.8 g/dL (ref 0.4–1.0)
B-Globulin SerPl Elph-Mcnc: 0.9 g/dL (ref 0.7–1.3)
Gamma Glob SerPl Elph-Mcnc: 0.9 g/dL (ref 0.4–1.8)
Globulin, Total: 3 g/dL (ref 2.2–3.9)
IgA: 295 mg/dL (ref 61–437)
IgG (Immunoglobin G), Serum: 1004 mg/dL (ref 603–1613)
IgM (Immunoglobulin M), Srm: 35 mg/dL (ref 15–143)
M Protein SerPl Elph-Mcnc: 0.1 g/dL — ABNORMAL HIGH
Total Protein ELP: 6.1 g/dL (ref 6.0–8.5)

## 2020-12-27 NOTE — Progress Notes (Signed)
Hematology and Oncology Follow Up Visit  Jacob [name] [id number] 06/07/1945 75 y.o. 12/27/2020 9:32 AM Jacob [name], MDShah, Jacob Forest, MD   Principle Diagnosis:  75 year old man with gastric cancer diagnosed in 2017.  He was found to have T3N0 adenocarcinoma at that time.  Secondary diagnoses:   1.  Prostate cancer diagnosed in 2013.  He presented with stage T1c, Gleason score 3+4 = 7 and a PSA 5.66.    2.  IgG lambda MGUS diagnosed in 2017.  No evidence of active myeloma or indication for treatment.  3.  Anemia of chronic kidney disease.  Prior Therapy:  He is status post radiation therapy for definitive treatment for his prostate cancer. Therapy concluded in December 2013. He is status post packed red cell transfusions in April 2017. He is status post bone marrow biopsy in April 2017. Results did not show any plasma cell disorder or myelodysplasia. He is status post IV iron infusion completed in April 2017. This was repeated in September 2017. FOLFOX chemotherapy with cycle 1 to be given on 12/12/2015.  He is S/P cycle 5 of therapy given on 02/20/2016. He is status post distal gastrectomy and Billroth II reconstruction completed on 03/25/2016. The final pathology revealed a T3 N0 residual tumor without any lymphadenopathy involved.  Current therapy: Active surveillance.   Interim History:  Jacob [name] returns today for repeat evaluation.  Since the last visit, he reports no major changes in his health.  He denies any recent hospitalizations or illnesses.  He denies any excessive fatigue or tiredness.  He denies any bone pain or pathological fractures.  His performance status and quality of life remained unchanged.  He continues to ambulate and drive short distances without any decline in ability to do so.     Medications: Updated on review. Current Outpatient Medications  Medication Sig Dispense Refill   acetaminophen (TYLENOL) 500 MG tablet Take 1,000 mg by  mouth daily as needed for moderate pain or headache.     allopurinol (ZYLOPRIM) 300 MG tablet Take 150 mg by mouth daily.     buPROPion (WELLBUTRIN XL) 300 MG 24 hr tablet Take 300 mg by mouth daily.      busPIRone (BUSPAR) 10 MG tablet Take 10 mg by mouth.     calcium carbonate (TUMS - DOSED IN MG ELEMENTAL CALCIUM) 500 MG chewable tablet Chew 3 tablets by mouth 2 (two) times daily as needed for indigestion or heartburn.     cetirizine (ZYRTEC) 10 MG tablet Take 10 mg by mouth daily.     diclofenac Sodium (VOLTAREN) 1 % GEL Apply 2 g topically 4 (four) times daily. 100 g 1   donepezil (ARICEPT) 5 MG tablet Take 1 tablet (5 mg total) by mouth at bedtime. 30 tablet 6   furosemide (LASIX) 20 MG tablet Take 20 mg by mouth daily.      gabapentin (NEURONTIN) 400 MG capsule Take 400 mg by mouth.     hydrALAZINE (APRESOLINE) 25 MG tablet Take 25 mg by mouth 3 (three) times daily.      levothyroxine (SYNTHROID) 112 MCG tablet Take 112 mcg by mouth daily before breakfast. (Patient not taking: Reported on 11/23/2020)     lidocaine (XYLOCAINE) 5 % ointment Apply 1 application topically as needed. 35.44 g 0   LOKELMA 5 g packet TAKE 5 GRAMS BY MOUTH TWO TIMES A WEEK (Patient taking differently: Take 5 g by mouth 2 (two) times a week.) 15 packet 0   melatonin 3  MG TABS tablet Take 9 mg by mouth at bedtime.     Nebivolol HCl 20 MG TABS Take 20 mg by mouth daily.     pantoprazole (PROTONIX) 40 MG tablet Take 1 tablet (40 mg total) by mouth daily before supper. 30 tablet 0   polycarbophil (FIBERCON) 625 MG tablet Take 1 tablet (625 mg total) by mouth daily. (Patient not taking: Reported on 11/23/2020) 30 tablet 3   polyethylene glycol (MIRALAX / GLYCOLAX) 17 g packet Take 17 g by mouth 2 (two) times daily. 14 each 0   prochlorperazine (COMPAZINE) 10 MG tablet TAKE ONE TABLET BY MOUTH EVERY 6 HOURS AS NEEDED FOR NAUSSEA AND VOMITING (Patient not taking: Reported on 11/23/2020) 30 tablet 1   sodium bicarbonate 650  MG tablet Take 650 mg by mouth 2 (two) times daily. (Patient not taking: Reported on 11/23/2020)     sodium zirconium cyclosilicate (LOKELMA) 10 g PACK packet Take 10 g by mouth.     tamsulosin (FLOMAX) 0.4 MG CAPS capsule Take 1 capsule by mouth every day 30 capsule 11   venlafaxine XR (EFFEXOR-XR) 150 MG 24 hr capsule Take 150 mg by mouth every morning. For depression/anxiety     verapamil (CALAN) 80 MG tablet Take 80 mg by mouth 3 (three) times daily.      No current facility-administered medications for this visit.     Allergies:  Allergies  Allergen Reactions   Lisinopril Cough       Physical Exam:    Blood pressure 131/78, pulse 79, temperature 99 F (37.2 C), temperature source Temporal, resp. rate 18, height _0  (1.88 m), weight 252 lb 3.2 oz (114.4 kg), SpO2 100 %.     ECOG: 1     General appearance: Alert, awake without any distress. Head: Atraumatic without abnormalities Oropharynx: Without any thrush or ulcers. Eyes: No scleral icterus. Lymph nodes: No lymphadenopathy noted in the cervical, supraclavicular, or axillary nodes Heart:regular rate and rhythm, without any murmurs or gallops.   Lung: Clear to auscultation without any rhonchi, wheezes or dullness to percussion. Abdomin: Soft, nontender without any shifting dullness or ascites. Musculoskeletal: No clubbing or cyanosis. Neurological: No motor or sensory deficits. Skin: No rashes or lesions.        Lab Results: Lab Results  Component Value Date   WBC 5.9 12/24/2020   HGB 8.6 (L) 12/24/2020   HCT 26.5 (L) 12/24/2020   MCV 100.4 (H) 12/24/2020   PLT 150 12/24/2020     Chemistry      Component Value Date/Time   NA 138 12/24/2020 1333   NA 141 01/16/2017 0924   K 5.1 12/24/2020 1333   K 4.8 01/16/2017 0924   CL 108 12/24/2020 1333   CO2 22 12/24/2020 1333   CO2 25 01/16/2017 0924   BUN 42 (H) 12/24/2020 1333   BUN 24.0 01/16/2017 0924   CREATININE 3.53 (HH) 12/24/2020 1333    CREATININE 1.9 (H) 01/16/2017 0924      Component Value Date/Time   CALCIUM 9.1 12/24/2020 1333   CALCIUM 9.8 01/16/2017 0924   ALKPHOS 53 12/24/2020 1333   ALKPHOS 54 01/16/2017 0924   AST 26 12/24/2020 1333   AST 16 01/16/2017 0924   ALT 24 12/24/2020 1333   ALT 10 01/16/2017 0924   BILITOT 0.5 12/24/2020 1333   BILITOT 0.54 01/16/2017 0924      IMPRESSION: 1. 5 mm medial right upper lobe pulmonary nodule is new in the interval. Additional tiny right upper lobe  pulmonary nodule is stable. Attention on follow-up recommended. 2. No evidence for metastatic disease in the abdomen or pelvis. 3. Bilateral renal lesions of varying size and attenuation, likely simple cysts and cysts complicated by proteinaceous debris or hemorrhage. These are stable in the interval, but continued attention on follow-up recommended 4. Cholelithiasis. 5. Left colonic diverticulosis without diverticulitis. 6. Aortic Atherosclerosis (ICD10-I70.0).  Impression and Plan:  75 year old man with:    1.  Gastric cancer diagnosed in 2017.  He was found to have T3N0 adenocarcinoma at that time.  Imaging studies obtained on December 24, 2020 were personally reviewed and showed no clear-cut evidence of metastatic disease.  Risks and benefits of continued active surveillance were discussed at this time.  Given his recent pulmonary nodule we will repeat imaging studies 1 more time in the next 12 months and suspend imaging studies after that.  2.  Anemia: Related to chronic kidney disease.  His hemoglobin is close to baseline and will consider erythropoietin replacement in the future.   3.  IgG MGUS: Protein studies continues to show very small M spike of 0.2 g/dL without any evidence of symptomatic multiple myeloma.    4. Renal insufficiency: His creatinine has increased recently with creatinine clearance around 17 cc/min.  He has follow-up with nephrology today.  5.  5 mm upper lobe pulmonary nodule: It is  unlikely to be metastatic disease but will repeat imaging studies to ensure that we are dealing with a benign etiology.   6. Follow-up: He will return in 6 months for a follow-up evaluation.  30  minutes were spent on this visit.  The time was dedicated to reviewing laboratory data, disease status update, reviewing imaging studies and future plan of care discussion.   Zola Button, MD 12/15/20229:32 AM

## 2020-12-31 ENCOUNTER — Telehealth: Payer: Self-pay | Admitting: Neurology

## 2020-12-31 NOTE — Telephone Encounter (Signed)
VM left and mychart msg sent asking pt to cb and r/s 02/01 appt- MD out of office.

## 2021-01-04 ENCOUNTER — Telehealth: Payer: Self-pay | Admitting: Oncology

## 2021-01-04 NOTE — Telephone Encounter (Signed)
Sch per 12/15 los, left msg

## 2021-01-10 ENCOUNTER — Encounter: Payer: Self-pay | Admitting: Cardiology

## 2021-01-10 ENCOUNTER — Ambulatory Visit: Payer: Medicare Other | Admitting: Cardiology

## 2021-01-10 ENCOUNTER — Other Ambulatory Visit: Payer: Self-pay

## 2021-01-10 VITALS — BP 140/84 | HR 72 | Ht 74.0 in | Wt 245.0 lb

## 2021-01-10 DIAGNOSIS — R9439 Abnormal result of other cardiovascular function study: Secondary | ICD-10-CM

## 2021-01-10 DIAGNOSIS — I10 Essential (primary) hypertension: Secondary | ICD-10-CM

## 2021-01-10 DIAGNOSIS — E669 Obesity, unspecified: Secondary | ICD-10-CM | POA: Insufficient documentation

## 2021-01-10 MED ORDER — ASPIRIN EC 81 MG PO TBEC: 81.0000 mg | DELAYED_RELEASE_TABLET | Freq: Every day | ORAL | 3 refills | Status: DC

## 2021-01-10 MED ORDER — ROSUVASTATIN CALCIUM 10 MG PO TABS: 10.0000 mg | ORAL_TABLET | Freq: Every day | ORAL | 3 refills | Status: DC

## 2021-01-10 NOTE — Patient Instructions (Addendum)
Medication Instructions:  Your physician has recommended you make the following change in your medication:  START: Aspirin 81 mg once daily START: Crestor 10 mg once daily *If you need a refill on your cardiac medications before your next appointment, please call your pharmacy*   Lab Work: None If you have labs (blood work) drawn today and your tests are completely normal, you will receive your results only by: [place] (if you have MyChart) OR A paper copy in the mail If you have any lab test that is abnormal or we need to change your treatment, we will call you to review the results.   Testing/Procedures: None   Follow-Up: At [facility], you and your health needs are our priority.  As part of our continuing mission to provide you with exceptional heart care, we have created designated Provider Care Teams.  These Care Teams include your primary Cardiologist (physician) and Advanced Practice Providers (APPs -  Physician Assistants and Nurse Practitioners) who all work together to provide you with the care you need, when you need it.  We recommend signing up for the patient portal called "MyChart".  Sign up information is provided on this After Visit Summary.  MyChart is used to connect with patients for Virtual Visits (Telemedicine).  Patients are able to view lab/test results, encounter notes, upcoming appointments, etc.  Non-urgent messages can be sent to your provider as well.   To learn more about what you can do with MyChart, go to [email or web address].    Your next appointment:   4 week(s)  The format for your next appointment:   In Person  Provider:   Berniece Salines, DO     Other Instructions  Please speak to your nephrologist about your kidney function. You have to be cleared by your nephrologist in order to have cardiac procedures. Please keep [place] updated.

## 2021-01-10 NOTE — Progress Notes (Signed)
Cardiology Office Note:    Date:  01/10/2021   ID:  Jacob [name], DOB August 05, 1945, MRN [id number]  PCP:  [name], MD  Cardiologist:  Berniece Salines, DO  Electrophysiologist:  None   Referring MD: [name], MD   " I am doing okay"  History of Present Illness:    Jacob [name] is a 75 y.o. male with a hx of hypertension, obesity, history of stomach cancer, chronic kidney disease and gout is here today.  I saw the patient on November 23, 2020 at that time he was experiencing intermittent chest discomfort.  I sent the patient for a nuclear stress test.  He was able to get this testing done on December 25, 2020 which reported evidence of ischemia.  He is here today to discuss his testing results.  Past Medical History:  Diagnosis Date   Anemia    hx iron deficiency   Anxiety    Arthritis    gout   Back pain    BPH with obstruction/lower urinary tract symptoms    Chronic kidney disease    "they said I do"   Constipation    Depression    ED (erectile dysfunction)    Epigastric pain    GERD (gastroesophageal reflux disease)    Heartburn    History of blood transfusion    History of radiation therapy 11/03/11-12/29/11   prostate   Hypercholesterolemia    Hypertension    Night sweats    Oxygen deficiency    2x per week at night for sleep   Post-operative nausea and vomiting 04/09/2016   Prostate cancer ([place]) 08/14/11   Adenocarcinoma,gleason:3+3=6,& 3+4=7,PSA=5.66   PUD (peptic ulcer disease)    Sleep apnea    does not use Cpap but does use O2 @ 2l    Stomach cancer ([place])    Ulcer    peptic ulcer hx    Past Surgical History:  Procedure Laterality Date   colon polyps bx  11/24/06   colon,transverse and rectosigmoid polyps:tubular adenomas and hyperplastic polyps,no high grade dysplasia or malignancy    COLONOSCOPY WITH PROPOFOL N/A 08/24/2019   Procedure: COLONOSCOPY WITH PROPOFOL;  Surgeon: [name], DO;  Location: [facility];   Service: Gastroenterology;  Laterality: N/A;   duodenal bx  11/24/06   benign   ESOPHAGOGASTRODUODENOSCOPY N/A 10/27/2015   Procedure: ESOPHAGOGASTRODUODENOSCOPY (EGD);  Surgeon: [name], MD;  Location: [facility];  Service: Endoscopy;  Laterality: N/A;   EUS N/A 11/08/2015   Procedure: UPPER ENDOSCOPIC ULTRASOUND (EUS) RADIAL;  Surgeon: [name], MD;  Location: [facility];  Service: Endoscopy;  Laterality: N/A;   GASTRECTOMY N/A 03/25/2016   Procedure: DISTAL GASTRECTOMY;  Surgeon: [name], MD;  Location: [facility];  Service: General;  Laterality: N/A;   gastric bx  11/24/06   chronic active gastritis,with metaplasia and focal changaes of xanthelasma   GASTROSTOMY N/A 03/25/2016   Procedure: INSERTION OF FEEDING TUBE;  Surgeon: [name], MD;  Location: [facility];  Service: General;  Laterality: N/A;   INSERTION PROSTATE RADIATION SEED  12-29-11   IR GENERIC HISTORICAL  04/01/2016   IR [place] TUBE CHANGE 04/01/2016 Markus Daft, MD [facility]   IR GENERIC HISTORICAL  04/05/2016   IR [place] TUBE CHANGE 04/05/2016 Markus Daft, MD [facility]   LAPAROSCOPIC GASTRECTOMY  03/25/2016   Diagnostic laparoscopy, distal gastrectomy with Billroth 2 reconstruction and gastrojejunostomy tube   LAPAROSCOPY N/A 03/25/2016   Procedure: DIAGNOSTIC LAPAROSCOPY;  Surgeon: Dorris Fetch  Barry Dienes, MD;  Location: [facility];  Service: General;  Laterality: N/A;   POLYPECTOMY  08/24/2019   Procedure: POLYPECTOMY;  Surgeon: [name], DO;  Location: [facility];  Service: Gastroenterology;;   PORTACATH PLACEMENT Left 12/04/2015   Procedure: INSERTION PORT-A-CATH;  Surgeon: [name], MD;  Location: [facility];  Service: General;  Laterality: Left;   PROSTATE BIOPSY  08/14/11   Adenocarcinoma/volume=58.51cc,gleason=3+3=6 & 3+4=7   TONSILLECTOMY     75 years old    Current Medications: Current Meds  Medication Sig   acetaminophen (TYLENOL) 500 MG tablet Take 1,000 mg by mouth daily as needed for moderate pain or  headache.   allopurinol (ZYLOPRIM) 300 MG tablet Take 150 mg by mouth daily.   buPROPion (WELLBUTRIN XL) 300 MG 24 hr tablet Take 300 mg by mouth daily.    busPIRone (BUSPAR) 10 MG tablet Take 10 mg by mouth.   calcium carbonate (TUMS - DOSED IN MG ELEMENTAL CALCIUM) 500 MG chewable tablet Chew 3 tablets by mouth 2 (two) times daily as needed for indigestion or heartburn.   cetirizine (ZYRTEC) 10 MG tablet Take 10 mg by mouth daily.   diclofenac Sodium (VOLTAREN) 1 % GEL Apply 2 g topically 4 (four) times daily.   donepezil (ARICEPT) 5 MG tablet Take 1 tablet (5 mg total) by mouth at bedtime.   furosemide (LASIX) 20 MG tablet Take 20 mg by mouth daily.    gabapentin (NEURONTIN) 400 MG capsule Take 400 mg by mouth.   hydrALAZINE (APRESOLINE) 25 MG tablet Take 25 mg by mouth 3 (three) times daily.    lidocaine (XYLOCAINE) 5 % ointment Apply 1 application topically as needed.   LOKELMA 5 g packet TAKE 5 GRAMS BY MOUTH TWO TIMES A WEEK (Patient taking differently: Take 5 g by mouth 2 (two) times a week.)   melatonin 3 MG TABS tablet Take 9 mg by mouth at bedtime.   Nebivolol HCl 20 MG TABS Take 20 mg by mouth daily.   pantoprazole (PROTONIX) 40 MG tablet Take 1 tablet (40 mg total) by mouth daily before supper.   polyethylene glycol (MIRALAX / GLYCOLAX) 17 g packet Take 17 g by mouth 2 (two) times daily.   sodium zirconium cyclosilicate (LOKELMA) 10 g PACK packet Take 10 g by mouth.   tamsulosin (FLOMAX) 0.4 MG CAPS capsule Take 1 capsule by mouth every day   venlafaxine XR (EFFEXOR-XR) 150 MG 24 hr capsule Take 150 mg by mouth every morning. For depression/anxiety   verapamil (CALAN) 80 MG tablet Take 80 mg by mouth 3 (three) times daily.      Allergies:   Lisinopril   Social History   Socioeconomic History   Marital status: Married    Spouse name: [name]   Number of children: 3   Years of education: 12   Highest education level: Not on file  Occupational History   Occupation: retired     Fish farm manager: [employer]  Tobacco Use   Smoking status: Former    Packs/day: 1.50    Years: 30.00    Pack years: 45.00    Types: Cigarettes    Quit date: 04/13/2000    Years since quitting: 20.7   Smokeless tobacco: Never  Vaping Use   Vaping Use: Never used  Substance and Sexual Activity   Alcohol use: Not Currently    Alcohol/week: 1.0 standard drink    Types: 1 Cans of beer per week    Comment: a beer about once a week, less  than in his youth   Drug use: No   Sexual activity: Not Currently  Other Topics Concern   Not on file  Social History Narrative   Patient is married [name]) and lives at home with his wife and one child.   Patient has three children.   Patient is retired.   Patient has a high school education.   Patient is ambi-dextrous.   Patient drinks two cups of coffee about three times a week.    Social Determinants of Health   Financial Resource Strain: Not on file  Food Insecurity: Not on file  Transportation Needs: Not on file  Physical Activity: Not on file  Stress: Not on file  Social Connections: Not on file     Family History: The patient's family history includes Alcohol abuse in his father; Alcohol abuse (age of onset: 49) in his brother; Cancer in his mother and paternal aunt. There is no history of Colon cancer.  ROS:   Review of Systems  Constitution: Negative for decreased appetite, fever and weight gain.  HENT: Negative for congestion, ear discharge, hoarse voice and sore throat.   Eyes: Negative for discharge, redness, vision loss in right eye and visual halos.  Cardiovascular: Negative for chest pain, dyspnea on exertion, leg swelling, orthopnea and palpitations.  Respiratory: Negative for cough, hemoptysis, shortness of breath and snoring.   Endocrine: Negative for heat intolerance and polyphagia.  Hematologic/Lymphatic: Negative for bleeding problem. Does not bruise/bleed easily.  Skin: Negative for flushing, nail changes, rash and  suspicious lesions.  Musculoskeletal: Negative for arthritis, joint pain, muscle cramps, myalgias, neck pain and stiffness.  Gastrointestinal: Negative for abdominal pain, bowel incontinence, diarrhea and excessive appetite.  Genitourinary: Negative for decreased libido, genital sores and incomplete emptying.  Neurological: Negative for brief paralysis, focal weakness, headaches and loss of balance.  Psychiatric/Behavioral: Negative for altered mental status, depression and suicidal ideas.  Allergic/Immunologic: Negative for HIV exposure and persistent infections.    EKGs/Labs/Other Studies Reviewed:    The following studies were reviewed today:   EKG:  None today   Nuclear stress 12/25/2020   Findings are consistent with ischemia.   No ST deviation was noted.   Left ventricular function is normal. End diastolic cavity size is normal.   LV perfusion is abnormal. There is evidence of ischemia. Defect 1: There is a medium defect with moderate reduction in uptake present in the mid to basal inferior and inferolateral location(s) that is reversible. There is normal wall motion in the defect area. Consistent with ischemia.   Consistent with ischemia.The study is intermediate risk.   Prior study not available for comparison.    TTE 12/25/2020 IMPRESSIONS   1. Left ventricular ejection fraction, by estimation, is 50 to 55%. Left ventricular ejection fraction by PLAX is 50 %. The left ventricle has low normal function. The left ventricle has no regional wall motion  abnormalities. Left ventricular diastolic parameters are consistent with Grade I diastolic dysfunction (impaired relaxation).   2. Right ventricular systolic function is normal. The right ventricular size is normal. There is normal pulmonary artery systolic pressure. The estimated right ventricular systolic pressure is 09.4 mmHg.   3. The mitral valve is abnormal. Mild mitral valve regurgitation.   4. The aortic valve is  tricuspid. Aortic valve regurgitation is not visualized. Aortic valve sclerosis is present, with no evidence of aortic valve stenosis.   5. The inferior vena cava is normal in size with greater than 50% respiratory variability, suggesting right atrial  pressure of 3 mmHg.  Comparison(s): No prior Echocardiogram.   FINDINGS   Left Ventricle: Left ventricular ejection fraction, by estimation, is 50 to 55%. Left ventricular ejection fraction by PLAX is 50 %. The left ventricle has low normal function. The left ventricle has no regional wall motion abnormalities. The left  ventricular internal cavity size was normal in size. There is no left ventricular hypertrophy. Left ventricular diastolic parameters are  consistent with Grade I diastolic dysfunction (impaired relaxation). Indeterminate filling pressures.   Right Ventricle: The right ventricular size is normal. No increase in right ventricular wall thickness. Right ventricular systolic function is normal. There is normal pulmonary artery systolic pressure. The tricuspid regurgitant velocity is 1.83 m/s, and   with an assumed right atrial pressure of 3 mmHg, the estimated right ventricular systolic pressure is 81.0 mmHg.   Left Atrium: Left atrial size was normal in size.   Right Atrium: Right atrial size was normal in size.   Pericardium: There is no evidence of pericardial effusion.  Mitral Valve: The mitral valve is abnormal. There is mild thickening of the mitral valve leaflet(s). Mild to moderate mitral annular  calcification. Mild mitral valve regurgitation.   Tricuspid Valve: The tricuspid valve is grossly normal. Tricuspid valve regurgitation is trivial.   Aortic Valve: The aortic valve is tricuspid. Aortic valve regurgitation is not visualized. Aortic valve sclerosis is present, with no evidence of  aortic valve stenosis.   Pulmonic Valve: The pulmonic valve was grossly normal. Pulmonic valve regurgitation is trivial.   Aorta: The  aortic root and ascending aorta are structurally normal, with no evidence of dilitation.   Venous: The inferior vena cava is normal in size with greater than 50% respiratory variability, suggesting right atrial pressure of 3 mmHg.   IAS/Shunts: No atrial level shunt detected by color flow Doppler.   Recent Labs: 12/24/2020: ALT 24; BUN 42; Creatinine 3.53; Hemoglobin 8.6; Platelet Count 150; Potassium 5.1; Sodium 138  Recent Lipid Panel No results found for: CHOL, TRIG, HDL, CHOLHDL, VLDL, LDLCALC, LDLDIRECT  Physical Exam:    VS:  BP 140/84 (BP Location: Left Arm)    Pulse 72    Ht 6\' 2"  (1.88 m)    Wt 245 lb (111.1 kg)    SpO2 94%    BMI 31.46 kg/m     Wt Readings from Last 3 Encounters:  01/10/21 245 lb (111.1 kg)  12/27/20 252 lb 3.2 oz (114.4 kg)  12/25/20 248 lb (112.5 kg)     GEN: Well nourished, well developed in no acute distress HEENT: Normal NECK: No JVD; No carotid bruits LYMPHATICS: No lymphadenopathy CARDIAC: S1S2 noted,RRR, no murmurs, rubs, gallops RESPIRATORY:  Clear to auscultation without rales, wheezing or rhonchi  ABDOMEN: Soft, non-tender, non-distended, +bowel sounds, no guarding. EXTREMITIES: No edema, No cyanosis, no clubbing MUSCULOSKELETAL:  No deformity  SKIN: Warm and dry NEUROLOGIC:  Alert and oriented x 3, non-focal PSYCHIATRIC:  Normal affect, good insight  ASSESSMENT:    1. Abnormal stress test   2. Essential hypertension   3. Obesity (BMI 30-39.9)    PLAN:    We discussed her abnormal nuclear stress test.  I will start the patient on aspirin 81 mg daily, Crestor 10 mg daily with Imdur 30 mg daily.  He ideally needs a heart catheterization but shared decision the patient will discuss with his nephrologist given his worsening kidney disease.  Once reevaluated by nephrology and the major recommendation we will discuss appropriate neck step.  But for  right now we will continue with medications.  His blood pressure slightly elevated in the  office today.  I am hoping additional Imdur will also help with dyspnea  Chronic kidney disease-he has a follow-up with his nephrology on January 17 as the patient to please keep this appointment as he would need to discuss with nephrology about all potential plans for heart catheterization.    The patient understands the need to lose weight with diet and exercise. We have discussed specific strategies for this.  The patient is in agreement with the above plan. The patient left the office in stable condition.  The patient will follow up in 8 weeks.   Medication Adjustments/Labs and Tests Ordered: Current medicines are reviewed at length with the patient today.  Concerns regarding medicines are outlined above.  No orders of the defined types were placed in this encounter.  No orders of the defined types were placed in this encounter.   There are no Patient Instructions on file for this visit.   Adopting a Healthy Lifestyle.  Know what a healthy weight is for you (roughly BMI <25) and aim to maintain this   Aim for 7+ servings of fruits and vegetables daily   65-80+ fluid ounces of water or unsweet tea for healthy kidneys   Limit to max 1 drink of alcohol per day; avoid smoking/tobacco   Limit animal fats in diet for cholesterol and heart health - choose grass fed whenever available   Avoid highly processed foods, and foods high in saturated/trans fats   Aim for low stress - take time to unwind and care for your mental health   Aim for 150 min of moderate intensity exercise weekly for heart health, and weights twice weekly for bone health   Aim for 7-9 hours of sleep daily   When it comes to diets, agreement about the perfect plan isnt easy to find, even among the experts. Experts at the [facility] developed an idea known as the Healthy Eating Plate. Just imagine a plate divided into logical, healthy portions.   The emphasis is on diet quality:   Load up  on vegetables and fruits - one-half of your plate: Aim for color and variety, and remember that potatoes dont count.   Go for whole grains - one-quarter of your plate: Whole wheat, barley, wheat berries, quinoa, oats, brown rice, and foods made with them. If you want pasta, go with whole wheat pasta.   Protein power - one-quarter of your plate: Fish, chicken, beans, and nuts are all healthy, versatile protein sources. Limit red meat.   The diet, however, does go beyond the plate, offering a few other suggestions.   Use healthy plant oils, such as olive, canola, soy, corn, sunflower and peanut. Check the labels, and avoid partially hydrogenated oil, which have unhealthy trans fats.   If youre thirsty, drink water. Coffee and tea are good in moderation, but skip sugary drinks and limit milk and dairy products to one or two daily servings.   The type of carbohydrate in the diet is more important than the amount. Some sources of carbohydrates, such as vegetables, fruits, whole grains, and beans-are healthier than others.   Finally, stay active  Signed, Berniece Salines, DO  01/10/2021 3:26 PM    [facility]

## 2021-01-10 NOTE — Addendum Note (Signed)
Addended by: Orvan July on: 01/10/2021 03:38 PM   Modules accepted: Orders

## 2021-02-07 ENCOUNTER — Other Ambulatory Visit: Payer: Self-pay

## 2021-02-07 ENCOUNTER — Encounter: Payer: Self-pay | Admitting: Cardiology

## 2021-02-07 ENCOUNTER — Ambulatory Visit: Payer: Medicare Other | Admitting: Cardiology

## 2021-02-07 VITALS — BP 120/70 | HR 63 | Ht 74.0 in | Wt 245.0 lb

## 2021-02-07 DIAGNOSIS — R9439 Abnormal result of other cardiovascular function study: Secondary | ICD-10-CM

## 2021-02-07 DIAGNOSIS — N182 Chronic kidney disease, stage 2 (mild): Secondary | ICD-10-CM | POA: Diagnosis not present

## 2021-02-07 DIAGNOSIS — I10 Essential (primary) hypertension: Secondary | ICD-10-CM

## 2021-02-07 DIAGNOSIS — E669 Obesity, unspecified: Secondary | ICD-10-CM | POA: Diagnosis not present

## 2021-02-07 NOTE — Patient Instructions (Signed)
Medication Instructions:  ?Your physician recommends that you continue on your current medications as directed. Please refer to the Current Medication list given to you today.  ?*If you need a refill on your cardiac medications before your next appointment, please call your pharmacy* ? ? ?Lab Work: ?None ?If you have labs (blood work) drawn today and your tests are completely normal, you will receive your results only by: ?MyChart Message (if you have MyChart) OR ?A paper copy in the mail ?If you have any lab test that is abnormal or we need to change your treatment, we will call you to review the results. ? ? ?Testing/Procedures: ?None ? ? ?Follow-Up: ?At [facility], you and your health needs are our priority.  As part of our continuing mission to provide you with exceptional heart care, we have created designated Provider Care Teams.  These Care Teams include your primary Cardiologist (physician) and Advanced Practice Providers (APPs -  Physician Assistants and Nurse Practitioners) who all work together to provide you with the care you need, when you need it. ? ?We recommend signing up for the patient portal called "MyChart".  Sign up information is provided on this After Visit Summary.  MyChart is used to connect with patients for Virtual Visits (Telemedicine).  Patients are able to view lab/test results, encounter notes, upcoming appointments, etc.  Non-urgent messages can be sent to your provider as well.   ?To learn more about what you can do with MyChart, go to [email or web address].   ? ?Your next appointment:   ?6 month(s) ? ?The format for your next appointment:   ?In Person ? ?Provider:   ?Kardie Tobb, DO   ? ? ?Other Instructions ?  ?

## 2021-02-07 NOTE — Progress Notes (Signed)
Cardiology Office Note:    Date:  02/07/2021   ID:  [name], DOB [birth year], MRN [id number]  PCP:  [name], MD  Cardiologist:  Berniece Salines, DO  Electrophysiologist:  None   Referring MD: [name], MD   " I am doing well"  History of Present Illness:    Jacob [name] is a 76 y.o. male with a hx of hypertension, obesity, history of stomach cancer, chronic kidney disease and gout is here today.   I saw the patient on November 23, 2020 at that time he was experiencing intermittent chest discomfort.  I sent the patient for a nuclear stress test.  He was able to get this testing done on December 25, 2020 which reported evidence of ischemia.   I saw the patient on January 10, 2021 at that time we talked about the abnormal stress test I started the patient on Crestor 10 mg daily, Aspirin 81 mg with Imdur 30 mg daily. During that time we talked about left heart catheterization and I wanted the patient to talk to his nephrologist first.  Today he tells me that he discussed with his nephrologist and he prefers not to proceed with a left heart catheterization due to the impact of contrast on his kidneys.  He tells me today since starting the medication he has not had any symptoms or chest pain.  No other complaints at this time.  Past Medical History:  Diagnosis Date   Anemia    hx iron deficiency   Anxiety    Arthritis    gout   Back pain    BPH with obstruction/lower urinary tract symptoms    Chronic kidney disease    "they said I do"   Constipation    Depression    ED (erectile dysfunction)    Epigastric pain    GERD (gastroesophageal reflux disease)    Heartburn    History of blood transfusion    History of radiation therapy 11/03/11-12/29/11   prostate   Hypercholesterolemia    Hypertension    Night sweats    Oxygen deficiency    2x per week at night for sleep   Post-operative nausea and vomiting 04/09/2016   Prostate cancer ([place])  08/14/11   Adenocarcinoma,gleason:3+3=6,& 3+4=7,PSA=5.66   PUD (peptic ulcer disease)    Sleep apnea    does not use Cpap but does use O2 @ 2l    Stomach cancer ([place])    Ulcer    peptic ulcer hx    Past Surgical History:  Procedure Laterality Date   colon polyps bx  11/24/06   colon,transverse and rectosigmoid polyps:tubular adenomas and hyperplastic polyps,no high grade dysplasia or malignancy    COLONOSCOPY WITH PROPOFOL N/A 08/24/2019   Procedure: COLONOSCOPY WITH PROPOFOL;  Surgeon: [name], DO;  Location: [facility];  Service: Gastroenterology;  Laterality: N/A;   duodenal bx  11/24/06   benign   ESOPHAGOGASTRODUODENOSCOPY N/A 10/27/2015   Procedure: ESOPHAGOGASTRODUODENOSCOPY (EGD);  Surgeon: [name], MD;  Location: [facility];  Service: Endoscopy;  Laterality: N/A;   EUS N/A 11/08/2015   Procedure: UPPER ENDOSCOPIC ULTRASOUND (EUS) RADIAL;  Surgeon: [name], MD;  Location: [facility];  Service: Endoscopy;  Laterality: N/A;   GASTRECTOMY N/A 03/25/2016   Procedure: DISTAL GASTRECTOMY;  Surgeon: [name], MD;  Location: [facility];  Service: General;  Laterality: N/A;   gastric bx  11/24/06   chronic active gastritis,with metaplasia and focal changaes of xanthelasma  GASTROSTOMY N/A 03/25/2016   Procedure: INSERTION OF FEEDING TUBE;  Surgeon: [name], MD;  Location: [facility];  Service: General;  Laterality: N/A;   INSERTION PROSTATE RADIATION SEED  12-29-11   IR GENERIC HISTORICAL  04/01/2016   IR [place] TUBE CHANGE 04/01/2016 Markus Daft, MD [facility]   IR GENERIC HISTORICAL  04/05/2016   IR [place] TUBE CHANGE 04/05/2016 Markus Daft, MD [facility]   LAPAROSCOPIC GASTRECTOMY  03/25/2016   Diagnostic laparoscopy, distal gastrectomy with Billroth 2 reconstruction and gastrojejunostomy tube   LAPAROSCOPY N/A 03/25/2016   Procedure: DIAGNOSTIC LAPAROSCOPY;  Surgeon: [name], MD;  Location: [facility];  Service: General;  Laterality: N/A;   POLYPECTOMY   08/24/2019   Procedure: POLYPECTOMY;  Surgeon: [name], DO;  Location: [facility];  Service: Gastroenterology;;   PORTACATH PLACEMENT Left 12/04/2015   Procedure: INSERTION PORT-A-CATH;  Surgeon: [name], MD;  Location: [facility];  Service: General;  Laterality: Left;   PROSTATE BIOPSY  08/14/11   Adenocarcinoma/volume=58.51cc,gleason=3+3=6 & 3+4=7   TONSILLECTOMY     76 years old    Current Medications: Current Meds  Medication Sig   acetaminophen (TYLENOL) 500 MG tablet Take 1,000 mg by mouth daily as needed for moderate pain or headache.   allopurinol (ZYLOPRIM) 300 MG tablet Take 150 mg by mouth daily.   aspirin EC 81 MG tablet Take 1 tablet (81 mg total) by mouth daily. Swallow whole.   buPROPion (WELLBUTRIN XL) 300 MG 24 hr tablet Take 300 mg by mouth daily.    busPIRone (BUSPAR) 10 MG tablet Take 10 mg by mouth.   calcium carbonate (TUMS - DOSED IN MG ELEMENTAL CALCIUM) 500 MG chewable tablet Chew 3 tablets by mouth 2 (two) times daily as needed for indigestion or heartburn.   cetirizine (ZYRTEC) 10 MG tablet Take 10 mg by mouth daily.   diclofenac Sodium (VOLTAREN) 1 % GEL Apply 2 g topically 4 (four) times daily.   donepezil (ARICEPT) 5 MG tablet Take 1 tablet (5 mg total) by mouth at bedtime.   furosemide (LASIX) 20 MG tablet Take 20 mg by mouth daily.    gabapentin (NEURONTIN) 400 MG capsule Take 400 mg by mouth.   hydrALAZINE (APRESOLINE) 50 MG tablet Take 50 mg by mouth 3 (three) times daily.   lidocaine (XYLOCAINE) 5 % ointment Apply 1 application topically as needed.   LOKELMA 5 g packet TAKE 5 GRAMS BY MOUTH TWO TIMES A WEEK (Patient taking differently: Take 5 g by mouth 2 (two) times a week.)   melatonin 3 MG TABS tablet Take 9 mg by mouth at bedtime.   Nebivolol HCl 20 MG TABS Take 20 mg by mouth daily.   pantoprazole (PROTONIX) 40 MG tablet Take 1 tablet (40 mg total) by mouth daily before supper.   polyethylene glycol (MIRALAX / GLYCOLAX) 17 g packet Take  17 g by mouth 2 (two) times daily.   rosuvastatin (CRESTOR) 10 MG tablet Take 1 tablet (10 mg total) by mouth daily.   rosuvastatin (CRESTOR) 5 MG tablet Take 5 mg by mouth daily. Patient states he takes 15 MG daily   sodium zirconium cyclosilicate (LOKELMA) 10 g PACK packet Take 10 g by mouth.   tamsulosin (FLOMAX) 0.4 MG CAPS capsule Take 1 capsule by mouth every day   venlafaxine XR (EFFEXOR-XR) 150 MG 24 hr capsule Take 150 mg by mouth every morning. For depression/anxiety   verapamil (CALAN) 80 MG tablet Take 80 mg by mouth 3 (three) times daily.  Allergies:   Lisinopril   Social History   Socioeconomic History   Marital status: Married    Spouse name: [name]   Number of children: 3   Years of education: 12   Highest education level: Not on file  Occupational History   Occupation: retired    Fish farm manager: [employer]  Tobacco Use   Smoking status: Former    Packs/day: 1.50    Years: 30.00    Pack years: 45.00    Types: Cigarettes    Quit date: 04/13/2000    Years since quitting: 20.8   Smokeless tobacco: Never  Vaping Use   Vaping Use: Never used  Substance and Sexual Activity   Alcohol use: Not Currently    Alcohol/week: 1.0 standard drink    Types: 1 Cans of beer per week    Comment: a beer about once a week, less than in his youth   Drug use: No   Sexual activity: Not Currently  Other Topics Concern   Not on file  Social History Narrative   Patient is married [name]) and lives at home with his wife and one child.   Patient has three children.   Patient is retired.   Patient has a high school education.   Patient is ambi-dextrous.   Patient drinks two cups of coffee about three times a week.    Social Determinants of Health   Financial Resource Strain: Not on file  Food Insecurity: Not on file  Transportation Needs: Not on file  Physical Activity: Not on file  Stress: Not on file  Social Connections: Not on file     Family History: The patient's  family history includes Alcohol abuse in his father; Alcohol abuse (age of onset: 86) in his brother; Cancer in his mother and paternal aunt. There is no history of Colon cancer.  ROS:   Review of Systems  Constitution: Negative for decreased appetite, fever and weight gain.  HENT: Negative for congestion, ear discharge, hoarse voice and sore throat.   Eyes: Negative for discharge, redness, vision loss in right eye and visual halos.  Cardiovascular: Negative for chest pain, dyspnea on exertion, leg swelling, orthopnea and palpitations.  Respiratory: Negative for cough, hemoptysis, shortness of breath and snoring.   Endocrine: Negative for heat intolerance and polyphagia.  Hematologic/Lymphatic: Negative for bleeding problem. Does not bruise/bleed easily.  Skin: Negative for flushing, nail changes, rash and suspicious lesions.  Musculoskeletal: Negative for arthritis, joint pain, muscle cramps, myalgias, neck pain and stiffness.  Gastrointestinal: Negative for abdominal pain, bowel incontinence, diarrhea and excessive appetite.  Genitourinary: Negative for decreased libido, genital sores and incomplete emptying.  Neurological: Negative for brief paralysis, focal weakness, headaches and loss of balance.  Psychiatric/Behavioral: Negative for altered mental status, depression and suicidal ideas.  Allergic/Immunologic: Negative for HIV exposure and persistent infections.    EKGs/Labs/Other Studies Reviewed:    The following studies were reviewed today:   EKG:  None today   Nuclear stress 12/25/2020   Findings are consistent with ischemia.   No ST deviation was noted.   Left ventricular function is normal. End diastolic cavity size is normal.   LV perfusion is abnormal. There is evidence of ischemia. Defect 1: There is a medium defect with moderate reduction in uptake present in the mid to basal inferior and inferolateral location(s) that is reversible. There is normal wall motion in the  defect area. Consistent with ischemia.   Consistent with ischemia.The study is intermediate risk.   Prior study  not available for comparison.     TTE 12/25/2020 IMPRESSIONS   1. Left ventricular ejection fraction, by estimation, is 50 to 55%. Left ventricular ejection fraction by PLAX is 50 %. The left ventricle has low normal function. The left ventricle has no regional wall motion  abnormalities. Left ventricular diastolic parameters are consistent with Grade I diastolic dysfunction (impaired relaxation).   2. Right ventricular systolic function is normal. The right ventricular size is normal. There is normal pulmonary artery systolic pressure. The estimated right ventricular systolic pressure is 15.1 mmHg.   3. The mitral valve is abnormal. Mild mitral valve regurgitation.   4. The aortic valve is tricuspid. Aortic valve regurgitation is not visualized. Aortic valve sclerosis is present, with no evidence of aortic valve stenosis.   5. The inferior vena cava is normal in size with greater than 50% respiratory variability, suggesting right atrial pressure of 3 mmHg.  Comparison(s): No prior Echocardiogram.   FINDINGS   Left Ventricle: Left ventricular ejection fraction, by estimation, is 50 to 55%. Left ventricular ejection fraction by PLAX is 50 %. The left ventricle has low normal function. The left ventricle has no regional wall motion abnormalities. The left  ventricular internal cavity size was normal in size. There is no left ventricular hypertrophy. Left ventricular diastolic parameters are  consistent with Grade I diastolic dysfunction (impaired relaxation). Indeterminate filling pressures.   Right Ventricle: The right ventricular size is normal. No increase in right ventricular wall thickness. Right ventricular systolic function is normal. There is normal pulmonary artery systolic pressure. The tricuspid regurgitant velocity is 1.83 m/s, and   with an assumed right atrial pressure of 3  mmHg, the estimated right ventricular systolic pressure is 76.1 mmHg.   Left Atrium: Left atrial size was normal in size.   Right Atrium: Right atrial size was normal in size.   Pericardium: There is no evidence of pericardial effusion.  Mitral Valve: The mitral valve is abnormal. There is mild thickening of the mitral valve leaflet(s). Mild to moderate mitral annular  calcification. Mild mitral valve regurgitation.   Tricuspid Valve: The tricuspid valve is grossly normal. Tricuspid valve regurgitation is trivial.   Aortic Valve: The aortic valve is tricuspid. Aortic valve regurgitation is not visualized. Aortic valve sclerosis is present, with no evidence of  aortic valve stenosis.   Pulmonic Valve: The pulmonic valve was grossly normal. Pulmonic valve regurgitation is trivial.   Aorta: The aortic root and ascending aorta are structurally normal, with no evidence of dilitation.   Venous: The inferior vena cava is normal in size with greater than 50% respiratory variability, suggesting right atrial pressure of 3 mmHg.   IAS/Shunts: No atrial level shunt detected by color flow Doppler.   Recent Labs: 12/24/2020: ALT 24; BUN 42; Creatinine 3.53; Hemoglobin 8.6; Platelet Count 150; Potassium 5.1; Sodium 138  Recent Lipid Panel No results found for: CHOL, TRIG, HDL, CHOLHDL, VLDL, LDLCALC, LDLDIRECT  Physical Exam:    VS:  BP 120/70 (BP Location: Left Arm, Patient Position: Sitting)    Pulse 63    Ht 6\' 2"  (1.88 m)    Wt 245 lb (111.1 kg)    SpO2 94%    BMI 31.46 kg/m     Wt Readings from Last 3 Encounters:  02/07/21 245 lb (111.1 kg)  01/10/21 245 lb (111.1 kg)  12/27/20 252 lb 3.2 oz (114.4 kg)     GEN: Well nourished, well developed in no acute distress HEENT: Normal NECK: No JVD; No  carotid bruits LYMPHATICS: No lymphadenopathy CARDIAC: S1S2 noted,RRR, no murmurs, rubs, gallops RESPIRATORY:  Clear to auscultation without rales, wheezing or rhonchi  ABDOMEN: Soft,  non-tender, non-distended, +bowel sounds, no guarding. EXTREMITIES: No edema, No cyanosis, no clubbing MUSCULOSKELETAL:  No deformity  SKIN: Warm and dry NEUROLOGIC:  Alert and oriented x 3, non-focal PSYCHIATRIC:  Normal affect, good insight  ASSESSMENT:    1. Abnormal stress test   2. Essential hypertension   3. Obesity (BMI 30-39.9)   4. Chronic kidney disease, stage II (mild)    PLAN:    He prefers to be on medical management at this time we will keep the patient on the aspirin 81 mg daily, Crestor as well as the Imdur.  I do not think this is unreasonable given his worsening kidney disease to not proceed with left heart catheterization.  He understand the potential detrimental effects if there is a significant blockage that may not be intervened on.  He expresses understanding.  Hypertension-his blood pressure has improved.   The patient is in agreement with the above plan. The patient left the office in stable condition.  The patient will follow up in 6 months or sooner if needed.   Medication Adjustments/Labs and Tests Ordered: Current medicines are reviewed at length with the patient today.  Concerns regarding medicines are outlined above.  No orders of the defined types were placed in this encounter.  No orders of the defined types were placed in this encounter.   Patient Instructions  Medication Instructions:  Your physician recommends that you continue on your current medications as directed. Please refer to the Current Medication list given to you today.  *If you need a refill on your cardiac medications before your next appointment, please call your pharmacy*   Lab Work: None If you have labs (blood work) drawn today and your tests are completely normal, you will receive your results only by: [place] (if you have MyChart) OR A paper copy in the mail If you have any lab test that is abnormal or we need to change your treatment, we will call you to review  the results.   Testing/Procedures: None   Follow-Up: At [facility], you and your health needs are our priority.  As part of our continuing mission to provide you with exceptional heart care, we have created designated Provider Care Teams.  These Care Teams include your primary Cardiologist (physician) and Advanced Practice Providers (APPs -  Physician Assistants and Nurse Practitioners) who all work together to provide you with the care you need, when you need it.  We recommend signing up for the patient portal called "MyChart".  Sign up information is provided on this After Visit Summary.  MyChart is used to connect with patients for Virtual Visits (Telemedicine).  Patients are able to view lab/test results, encounter notes, upcoming appointments, etc.  Non-urgent messages can be sent to your provider as well.   To learn more about what you can do with MyChart, go to [email or web address].    Your next appointment:   6 month(s)  The format for your next appointment:   In Person  Provider:   Berniece Salines, DO     Other Instructions     Adopting a Healthy Lifestyle.  Know what a healthy weight is for you (roughly BMI <25) and aim to maintain this   Aim for 7+ servings of fruits and vegetables daily   65-80+ fluid ounces of water or unsweet tea for healthy kidneys  Limit to max 1 drink of alcohol per day; avoid smoking/tobacco   Limit animal fats in diet for cholesterol and heart health - choose grass fed whenever available   Avoid highly processed foods, and foods high in saturated/trans fats   Aim for low stress - take time to unwind and care for your mental health   Aim for 150 min of moderate intensity exercise weekly for heart health, and weights twice weekly for bone health   Aim for 7-9 hours of sleep daily   When it comes to diets, agreement about the perfect plan isnt easy to find, even among the experts. Experts at the [employer]  developed an idea known as the Healthy Eating Plate. Just imagine a plate divided into logical, healthy portions.   The emphasis is on diet quality:   Load up on vegetables and fruits - one-half of your plate: Aim for color and variety, and remember that potatoes dont count.   Go for whole grains - one-quarter of your plate: Whole wheat, barley, wheat berries, quinoa, oats, brown rice, and foods made with them. If you want pasta, go with whole wheat pasta.   Protein power - one-quarter of your plate: Fish, chicken, beans, and nuts are all healthy, versatile protein sources. Limit red meat.   The diet, however, does go beyond the plate, offering a few other suggestions.   Use healthy plant oils, such as olive, canola, soy, corn, sunflower and peanut. Check the labels, and avoid partially hydrogenated oil, which have unhealthy trans fats.   If youre thirsty, drink water. Coffee and tea are good in moderation, but skip sugary drinks and limit milk and dairy products to one or two daily servings.   The type of carbohydrate in the diet is more important than the amount. Some sources of carbohydrates, such as vegetables, fruits, whole grains, and beans-are healthier than others.   Finally, stay active  Signed, Berniece Salines, DO  02/07/2021 5:47 PM    [facility]

## 2021-02-13 ENCOUNTER — Ambulatory Visit: Payer: Medicare Other | Admitting: Neurology

## 2021-02-16 ENCOUNTER — Observation Stay (HOSPITAL_COMMUNITY)
Admission: EM | Admit: 2021-02-16 | Discharge: 2021-02-19 | Disposition: A | Discharge status: Home / Self Care | Payer: Medicare Other | Attending: Internal Medicine | Admitting: Internal Medicine

## 2021-02-16 ENCOUNTER — Other Ambulatory Visit: Payer: Self-pay

## 2021-02-16 ENCOUNTER — Observation Stay (HOSPITAL_COMMUNITY): Payer: Medicare Other

## 2021-02-16 ENCOUNTER — Encounter (HOSPITAL_COMMUNITY): Payer: Self-pay

## 2021-02-16 ENCOUNTER — Emergency Department (HOSPITAL_COMMUNITY): Payer: Medicare Other

## 2021-02-16 DIAGNOSIS — R338 Other retention of urine: Secondary | ICD-10-CM | POA: Insufficient documentation

## 2021-02-16 DIAGNOSIS — Z85028 Personal history of other malignant neoplasm of stomach: Secondary | ICD-10-CM | POA: Insufficient documentation

## 2021-02-16 DIAGNOSIS — M6281 Muscle weakness (generalized): Secondary | ICD-10-CM | POA: Diagnosis not present

## 2021-02-16 DIAGNOSIS — Z7982 Long term (current) use of aspirin: Secondary | ICD-10-CM | POA: Diagnosis not present

## 2021-02-16 DIAGNOSIS — Z79899 Other long term (current) drug therapy: Secondary | ICD-10-CM | POA: Insufficient documentation

## 2021-02-16 DIAGNOSIS — Z6832 Body mass index (BMI) 32.0-32.9, adult: Secondary | ICD-10-CM | POA: Diagnosis not present

## 2021-02-16 DIAGNOSIS — M109 Gout, unspecified: Secondary | ICD-10-CM | POA: Insufficient documentation

## 2021-02-16 DIAGNOSIS — N401 Enlarged prostate with lower urinary tract symptoms: Secondary | ICD-10-CM | POA: Diagnosis not present

## 2021-02-16 DIAGNOSIS — R471 Dysarthria and anarthria: Secondary | ICD-10-CM | POA: Diagnosis present

## 2021-02-16 DIAGNOSIS — Z923 Personal history of irradiation: Secondary | ICD-10-CM | POA: Insufficient documentation

## 2021-02-16 DIAGNOSIS — I6782 Cerebral ischemia: Secondary | ICD-10-CM | POA: Insufficient documentation

## 2021-02-16 DIAGNOSIS — N281 Cyst of kidney, acquired: Secondary | ICD-10-CM | POA: Diagnosis not present

## 2021-02-16 DIAGNOSIS — Z7902 Long term (current) use of antithrombotics/antiplatelets: Secondary | ICD-10-CM | POA: Insufficient documentation

## 2021-02-16 DIAGNOSIS — Z20822 Contact with and (suspected) exposure to covid-19: Secondary | ICD-10-CM | POA: Insufficient documentation

## 2021-02-16 DIAGNOSIS — N19 Unspecified kidney failure: Secondary | ICD-10-CM

## 2021-02-16 DIAGNOSIS — I129 Hypertensive chronic kidney disease with stage 1 through stage 4 chronic kidney disease, or unspecified chronic kidney disease: Secondary | ICD-10-CM | POA: Diagnosis not present

## 2021-02-16 DIAGNOSIS — N184 Chronic kidney disease, stage 4 (severe): Secondary | ICD-10-CM | POA: Insufficient documentation

## 2021-02-16 DIAGNOSIS — Z87891 Personal history of nicotine dependence: Secondary | ICD-10-CM | POA: Insufficient documentation

## 2021-02-16 DIAGNOSIS — N179 Acute kidney failure, unspecified: Secondary | ICD-10-CM | POA: Insufficient documentation

## 2021-02-16 DIAGNOSIS — R2681 Unsteadiness on feet: Secondary | ICD-10-CM | POA: Diagnosis not present

## 2021-02-16 DIAGNOSIS — G9349 Other encephalopathy: Principal | ICD-10-CM

## 2021-02-16 DIAGNOSIS — I2510 Atherosclerotic heart disease of native coronary artery without angina pectoris: Secondary | ICD-10-CM | POA: Diagnosis not present

## 2021-02-16 DIAGNOSIS — R278 Other lack of coordination: Secondary | ICD-10-CM | POA: Diagnosis not present

## 2021-02-16 DIAGNOSIS — E669 Obesity, unspecified: Secondary | ICD-10-CM | POA: Insufficient documentation

## 2021-02-16 DIAGNOSIS — I639 Cerebral infarction, unspecified: Secondary | ICD-10-CM

## 2021-02-16 LAB — COMPREHENSIVE METABOLIC PANEL
ALT: 10 U/L (ref 0–44)
AST: 17 U/L (ref 15–41)
Albumin: 3.4 g/dL — ABNORMAL LOW (ref 3.5–5.0)
Alkaline Phosphatase: 42 U/L (ref 38–126)
Anion gap: 12 (ref 5–15)
BUN: 78 mg/dL — ABNORMAL HIGH (ref 8–23)
CO2: 22 mmol/L (ref 22–32)
Calcium: 9.5 mg/dL (ref 8.9–10.3)
Chloride: 105 mmol/L (ref 98–111)
Creatinine, Ser: 5.58 mg/dL — ABNORMAL HIGH (ref 0.61–1.24)
GFR, Estimated: 10 mL/min — ABNORMAL LOW (ref >60)
Glucose, Bld: 88 mg/dL (ref 70–99)
Potassium: 4.2 mmol/L (ref 3.5–5.1)
Sodium: 139 mmol/L (ref 135–145)
Total Bilirubin: 0.4 mg/dL (ref 0.3–1.2)
Total Protein: 6.6 g/dL (ref 6.5–8.1)

## 2021-02-16 LAB — DIFFERENTIAL
Abs Immature Granulocytes: 0.01 10*3/uL (ref 0.00–0.07)
Basophils Absolute: 0.1 10*3/uL (ref 0.0–0.1)
Basophils Relative: 1 %
Eosinophils Absolute: 0.5 10*3/uL (ref 0.0–0.5)
Eosinophils Relative: 8 %
Immature Granulocytes: 0 %
Lymphocytes Relative: 23 %
Lymphs Abs: 1.3 10*3/uL (ref 0.7–4.0)
Monocytes Absolute: 0.7 10*3/uL (ref 0.1–1.0)
Monocytes Relative: 12 %
Neutro Abs: 3.2 10*3/uL (ref 1.7–7.7)
Neutrophils Relative %: 56 %

## 2021-02-16 LAB — LIPID PANEL
Cholesterol: 76 mg/dL (ref 0–200)
HDL: 24 mg/dL — ABNORMAL LOW (ref >40)
LDL Cholesterol: 34 mg/dL (ref 0–99)
Total CHOL/HDL Ratio: 3.2 RATIO
Triglycerides: 91 mg/dL (ref <150)
VLDL: 18 mg/dL (ref 0–40)

## 2021-02-16 LAB — I-STAT CHEM 8, ED
BUN: 83 mg/dL — ABNORMAL HIGH (ref 8–23)
Calcium, Ion: 1.18 mmol/L (ref 1.15–1.40)
Chloride: 104 mmol/L (ref 98–111)
Creatinine, Ser: 5.9 mg/dL — ABNORMAL HIGH (ref 0.61–1.24)
Glucose, Bld: 84 mg/dL (ref 70–99)
HCT: 28 % — ABNORMAL LOW (ref 39.0–52.0)
Hemoglobin: 9.5 g/dL — ABNORMAL LOW (ref 13.0–17.0)
Potassium: 4.1 mmol/L (ref 3.5–5.1)
Sodium: 140 mmol/L (ref 135–145)
TCO2: 24 mmol/L (ref 22–32)

## 2021-02-16 LAB — CBC
HCT: 28.6 % — ABNORMAL LOW (ref 39.0–52.0)
Hemoglobin: 9.4 g/dL — ABNORMAL LOW (ref 13.0–17.0)
MCH: 33.2 pg (ref 26.0–34.0)
MCHC: 32.9 g/dL (ref 30.0–36.0)
MCV: 101.1 fL — ABNORMAL HIGH (ref 80.0–100.0)
Platelets: 162 10*3/uL (ref 150–400)
RBC: 2.83 MIL/uL — ABNORMAL LOW (ref 4.22–5.81)
RDW: 13.2 % (ref 11.5–15.5)
WBC: 5.7 10*3/uL (ref 4.0–10.5)
nRBC: 0 % (ref 0.0–0.2)

## 2021-02-16 LAB — APTT: aPTT: 34 seconds (ref 24–36)

## 2021-02-16 LAB — URINALYSIS, ROUTINE W REFLEX MICROSCOPIC
Bilirubin Urine: NEGATIVE
Glucose, UA: NEGATIVE mg/dL
Hgb urine dipstick: NEGATIVE
Ketones, ur: NEGATIVE mg/dL
Leukocytes,Ua: NEGATIVE
Nitrite: NEGATIVE
Protein, ur: NEGATIVE mg/dL
Specific Gravity, Urine: 1.01 (ref 1.005–1.030)
pH: 6.5 (ref 5.0–8.0)

## 2021-02-16 LAB — RESP PANEL BY RT-PCR (FLU A&B, COVID) ARPGX2
Influenza A by PCR: NEGATIVE
Influenza B by PCR: NEGATIVE
SARS Coronavirus 2 by RT PCR: NEGATIVE

## 2021-02-16 LAB — AMMONIA: Ammonia: 19 umol/L (ref 9–35)

## 2021-02-16 LAB — FOLATE: Folate: 14.6 ng/mL (ref >5.9)

## 2021-02-16 LAB — ETHANOL: Alcohol, Ethyl (B): <10 mg/dL (ref <10)

## 2021-02-16 LAB — HEMOGLOBIN A1C
Hgb A1c MFr Bld: 5.4 % (ref 4.8–5.6)
Mean Plasma Glucose: 108.28 mg/dL

## 2021-02-16 LAB — PROTIME-INR
INR: 1.1 (ref 0.8–1.2)
Prothrombin Time: 14.2 seconds (ref 11.4–15.2)

## 2021-02-16 LAB — MAGNESIUM: Magnesium: 2.2 mg/dL (ref 1.7–2.4)

## 2021-02-16 LAB — VITAMIN B12: Vitamin B-12: 185 pg/mL (ref 180–914)

## 2021-02-16 LAB — TSH: TSH: 1.616 u[IU]/mL (ref 0.350–4.500)

## 2021-02-16 IMAGING — MR MR MRA HEAD W/O CM
2 series · 17 of 48 positions shown · non-contrast
Comparison: MRI head [DATE], correlation is also made with same
day CT head [DATE]

CLINICAL DATA: Difficulty speaking and using left arm stroke/TIA
suspected, determine embolic source

EXAM:
MRI HEAD WITHOUT CONTRAST
MRA HEAD WITHOUT CONTRAST
MRA NECK WITHOUT CONTRAST
TECHNIQUE: Multiplanar, multi-echo pulse sequences of the brain and surrounding
structures were acquired without intravenous contrast. Angiographic
images of the Circle of Willis were acquired using MRA technique
without intravenous contrast. Angiographic images of the neck were
acquired using MRA technique without intravenous contrast. Carotid
stenosis measurements (when applicable) are obtained utilizing
NASCET criteria, using the distal internal carotid diameter as the
denominator.

[Series 2: ax (id) · axial · 1.0mm · 0.43mm/px · z∈[-62,+19]mm · 15 of 176 slices shown]
[im 1/176]
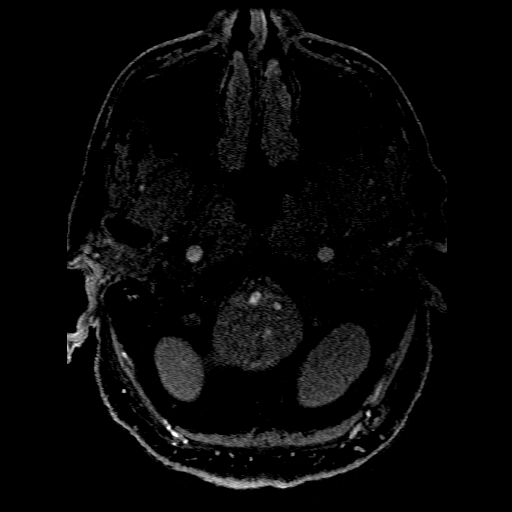
[im 4/176]
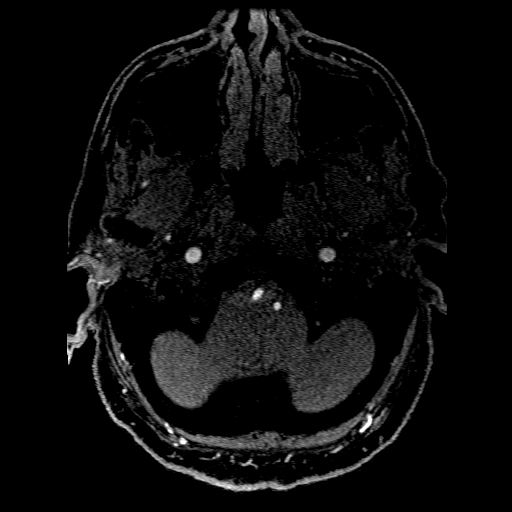
[im 8/176]
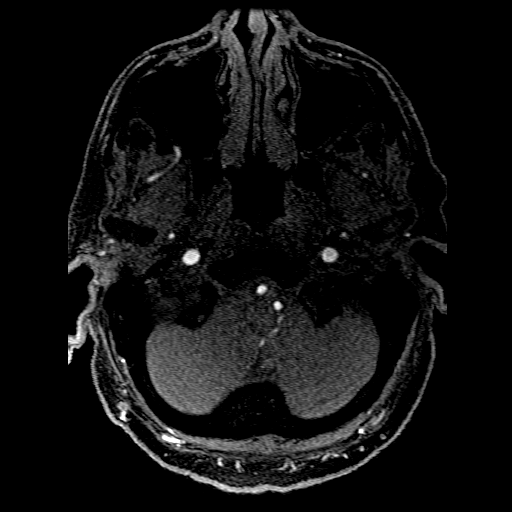
[im 12/176]
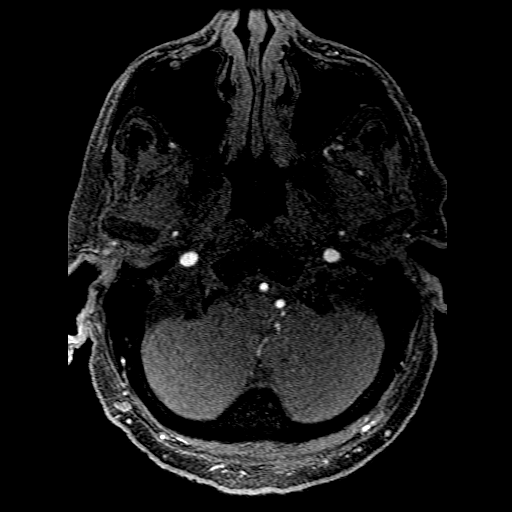
[im 16/176]
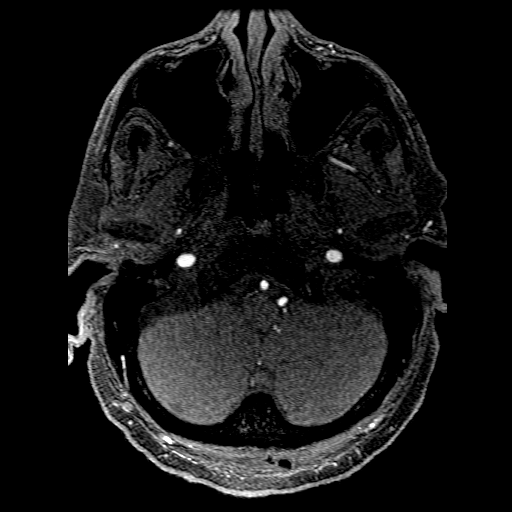
[im 28/176]
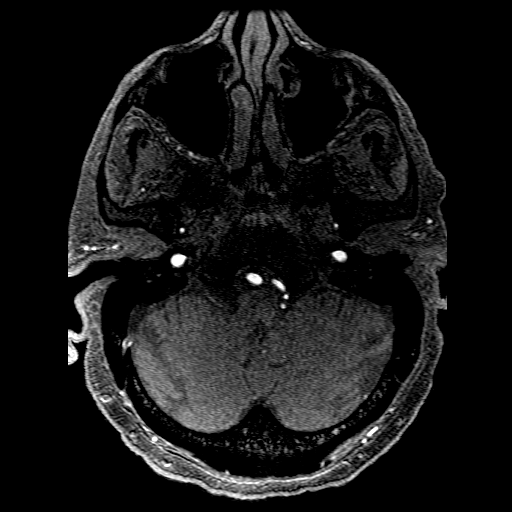
[im 32/176]
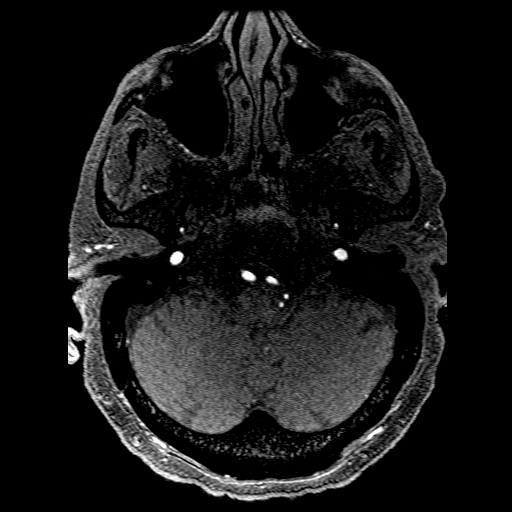
[im 55/176]
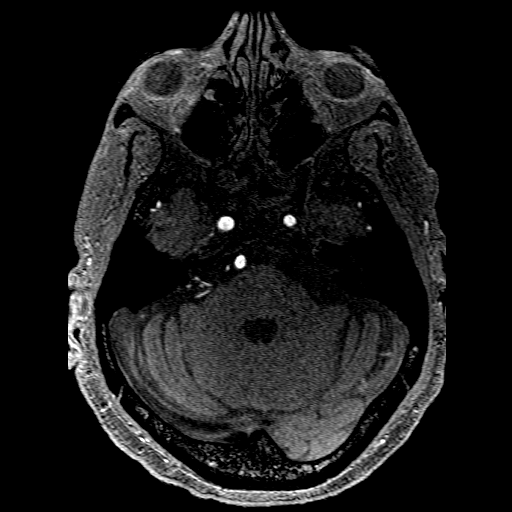
[im 78/176]
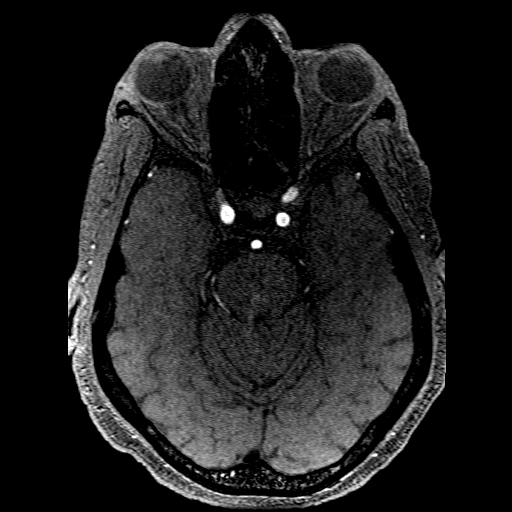
[im 90/176]
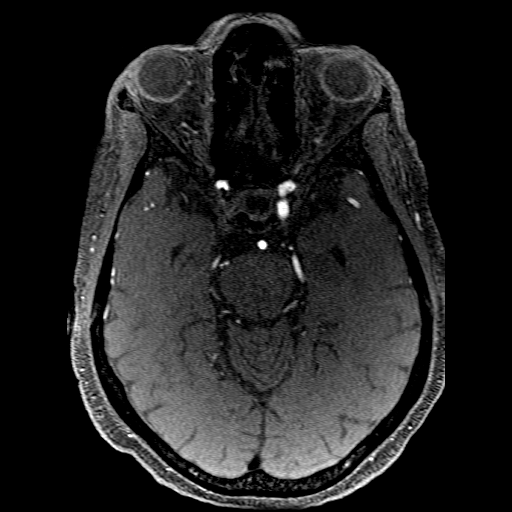
[im 98/176]
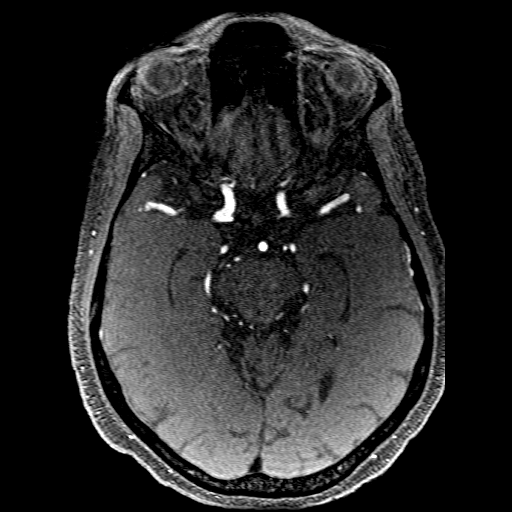
[im 121/176]
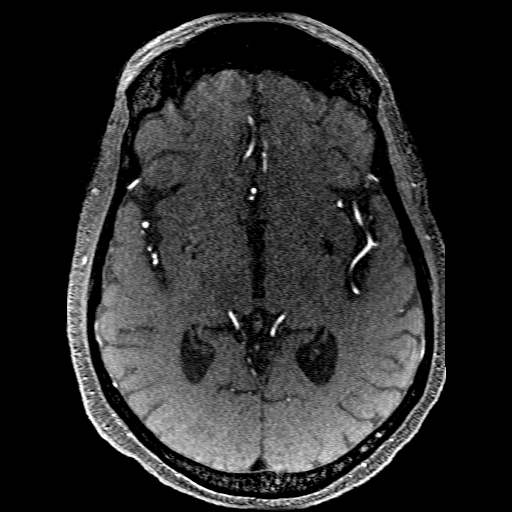
[im 144/176]
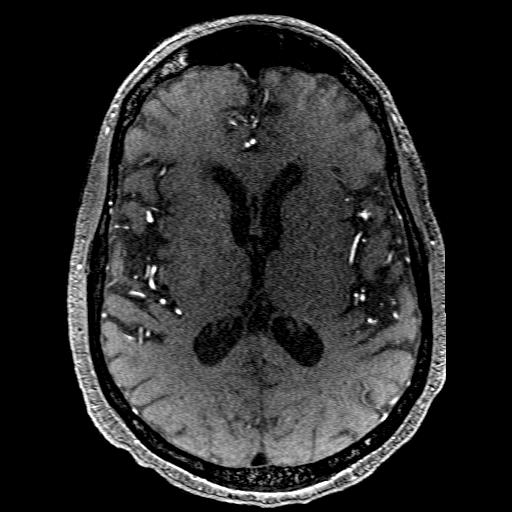
[im 148/176]
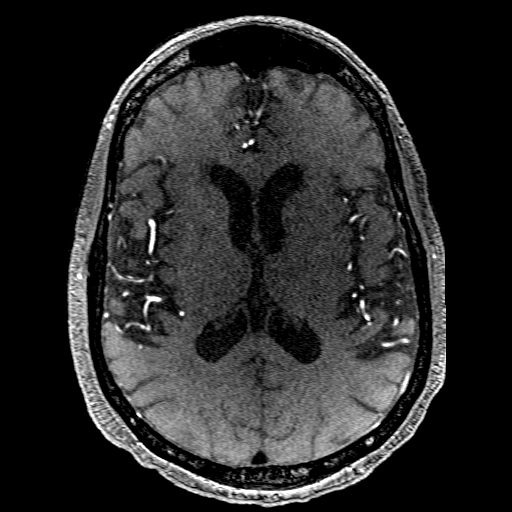
[im 168/176]
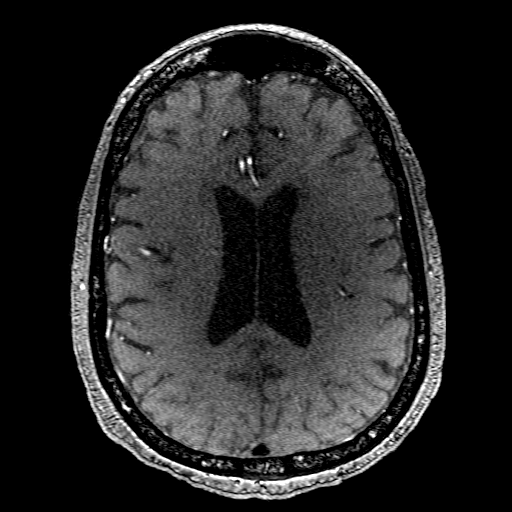

[Series 201: pjn:ax (id) · sagittal · 1.0mm · 0.43mm/px · 2 of 8 slices shown]
[im 1/8]
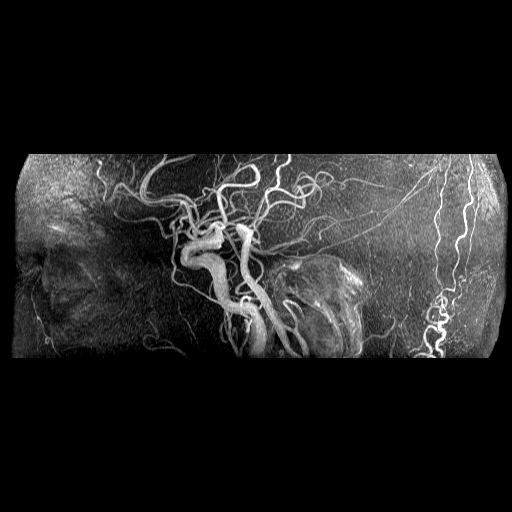
[im 8/8]
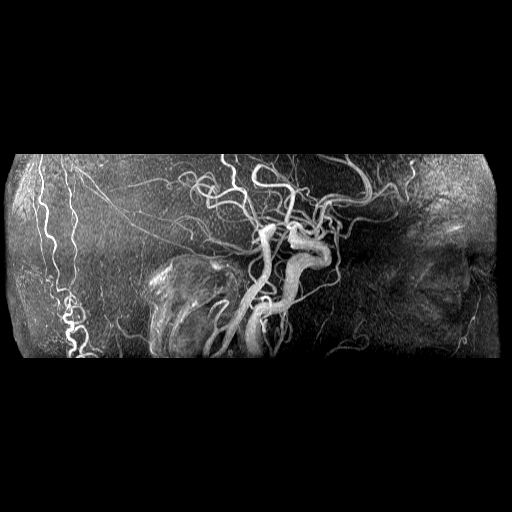

[17 of 48 positions shown; findings below may reference images not displayed]

FINDINGS: MRI HEAD FINDINGS

Brain: No restricted diffusion to suggest acute or subacute infarct.
No acute hemorrhage, mass, mass effect, or midline shift. No
hydrocephalus or extra-axial collection.

T2 hyperintense signal in the periventricular white matter and pons,
likely the sequela of moderate chronic small vessel ischemic
disease. Degree of cerebral atrophy is likely within normal limits
for age. Lacunar infarct in the right corona radiata, unchanged.

Vascular: Please see MRA findings below.

Skull and upper cervical spine: Normal marrow signal.

Sinuses/Orbits: Air-fluid level in the left maxillary sinus. Mucosal
thickening in the sphenoid sinuses and, to a lesser extent, ethmoid
air cells. The orbits are unremarkable.

Other: Fluid in the right mastoid air cells.

MRA HEAD FINDINGS

Anterior circulation: Both internal carotid arteries are patent to
the termini, with moderate narrowing of the left distal petrous and
proximal cavernous left ICA (series 2, image 113), likely secondary
to atherosclerotic plaque.

A1 segments patent. Normal anterior communicating artery. Anterior
cerebral arteries are patent to their distal aspects.

No M1 stenosis or occlusion. Normal MCA bifurcations. Distal MCA
branches perfused and symmetric.

Posterior circulation: Vertebral arteries patent to the
vertebrobasilar junction without stenosis. Basilar patent to its
distal aspect. Superior cerebellar arteries patent bilaterally.

Patent P1 segments. PCAs perfused to their distal aspects without
stenosis. The bilateral posterior communicating arteries are
visualized.

Anatomic variants: None significant

MRA NECK FINDINGS

Aortic arch: Limited visualization. Three-vessel arch without
evidence of dissection or definite aneurysm.

Right carotid system: No evidence of dissection, occlusion or
hemodynamically significant stenosis (greater than 50%). Likely
calcified plaque at the bifurcation and in the proximal (series 4,
image 71) and mid (series 4, image 41) right ICA causes less than
50% luminal narrowing.

Left carotid system: No evidence of dissection, occlusion or
hemodynamically significant stenosis (greater than 50%). Plaque in
the proximal (series 4, image 73) and distal (series 4, image 38)
left ICA causes less than 50% luminal narrowing.

Vertebral arteries: The origins of the vertebral arteries are not
well evaluated secondary to artifact. The bilateral V2 and V3
segments are patent, with multifocal irregularity primarily in the
V2 segments. No evidence of dissection, occlusion or hemodynamically
significant stenosis (greater than 50%).

Other: None
IMPRESSION: 1.  No acute intracranial process.
2. Moderate narrowing of the distal left petrous and proximal left
cavernous ICA. No intracranial large vessel occlusion or other
significant stenosis.
3. No hemodynamically significant stenosis in the neck. Narrowing in
the bilateral ICAs and vertebral arteries causes less than 50%
luminal narrowing.
4. Air-fluid level in the left maxillary sinus, as can be seen in
the setting of acute sinusitis. Correlate with symptoms.

## 2021-02-16 IMAGING — US US RENAL
1 series · 14 of 25 positions shown · non-contrast
Comparison: [DATE].

CLINICAL DATA: BALTODANO.

EXAM:
RENAL / URINARY TRACT ULTRASOUND COMPLETE

[Series 1: us renal · 14 of 56 slices shown]
[im 1/56]
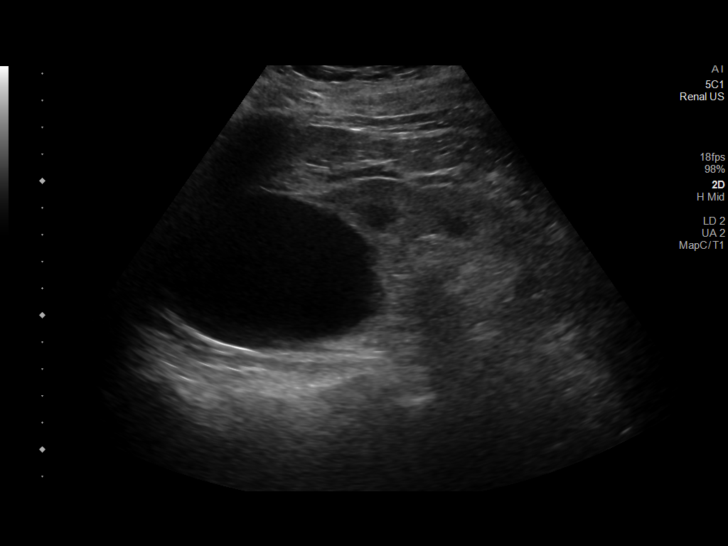
[im 5/56]
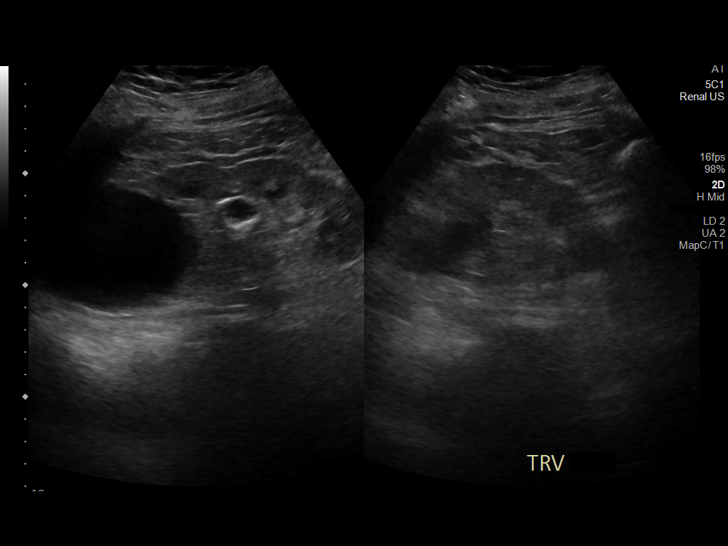
[im 10/56]
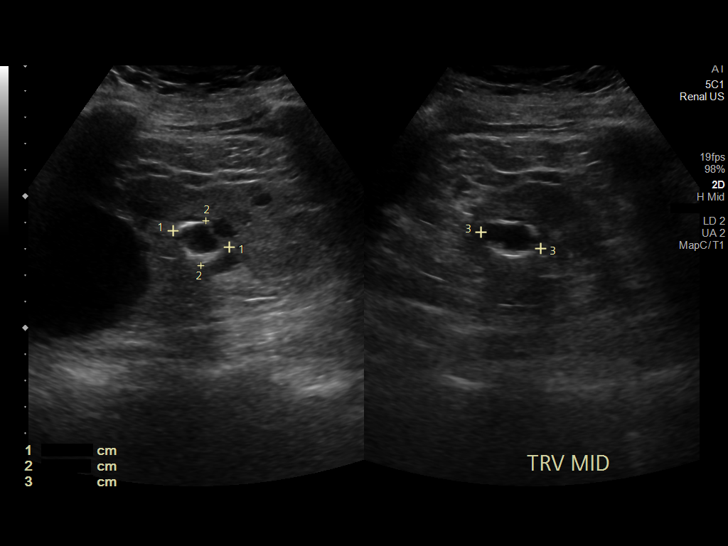
[im 14/56]
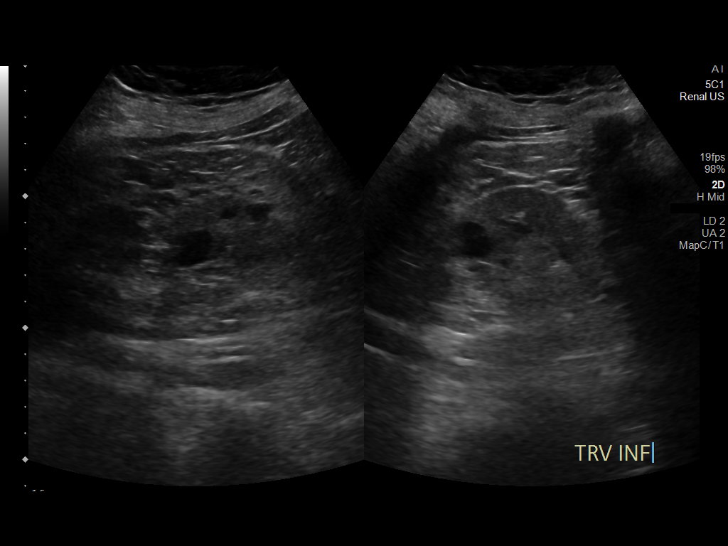
[im 19/56]
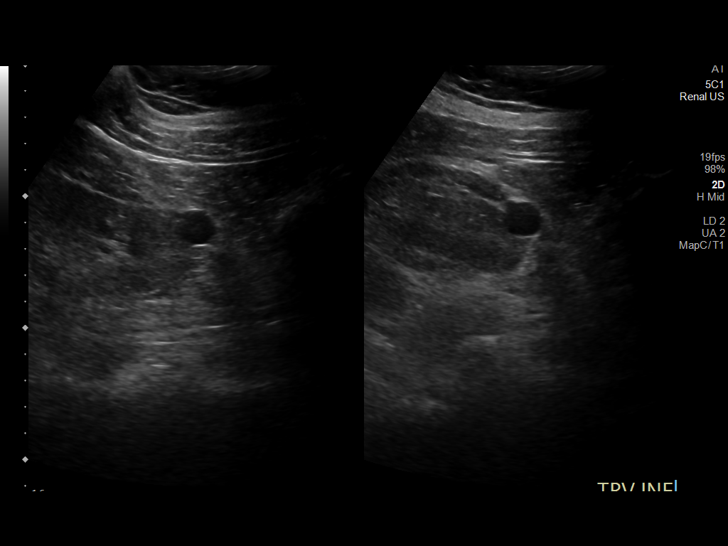
[im 21/56]
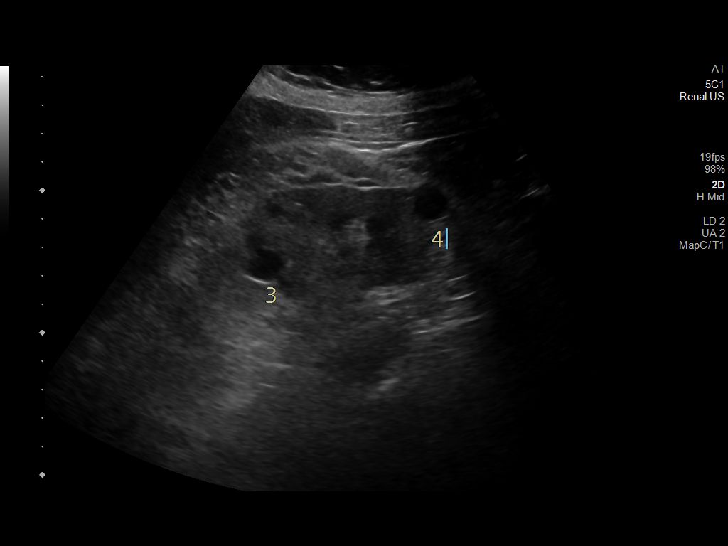
[im 26/56]
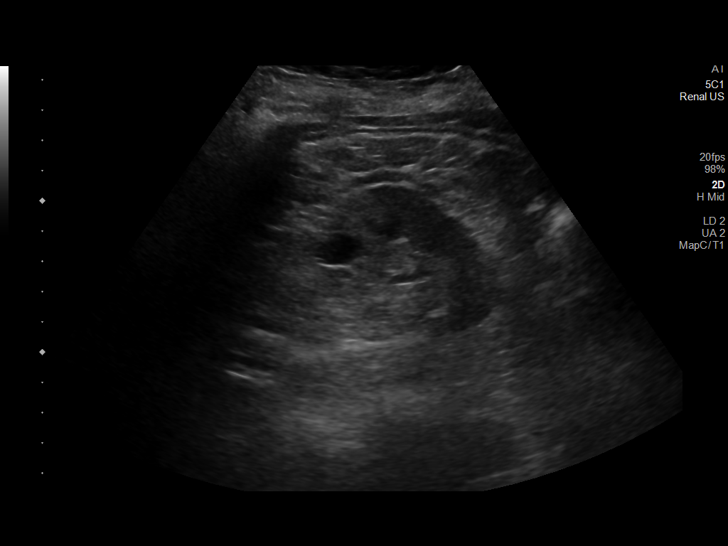
[im 30/56]
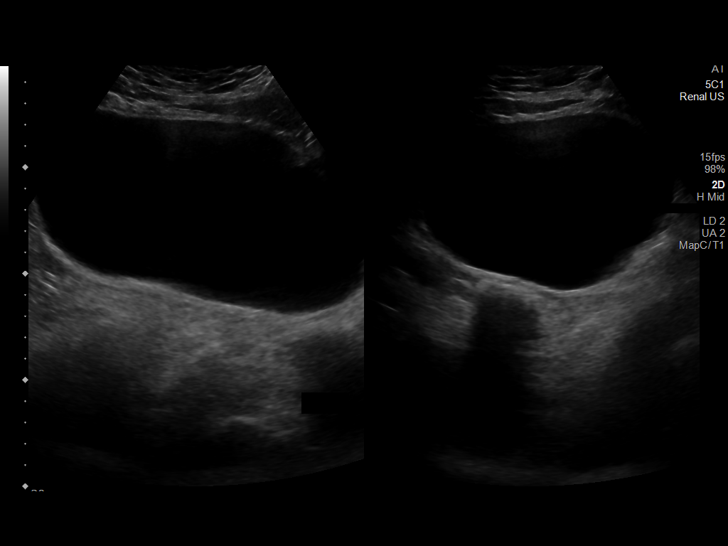
[im 35/56]
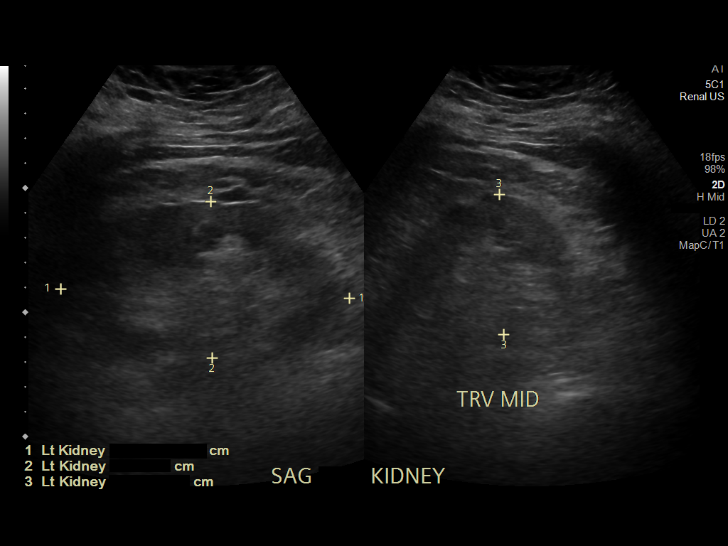
[im 37/56]
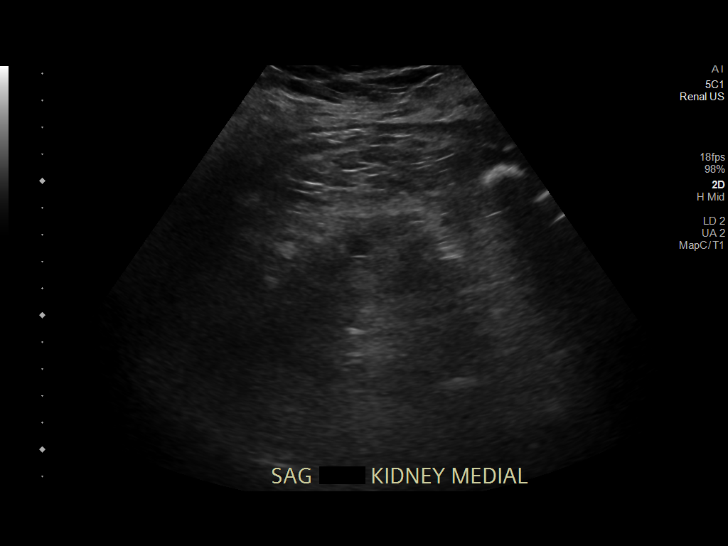
[im 42/56]
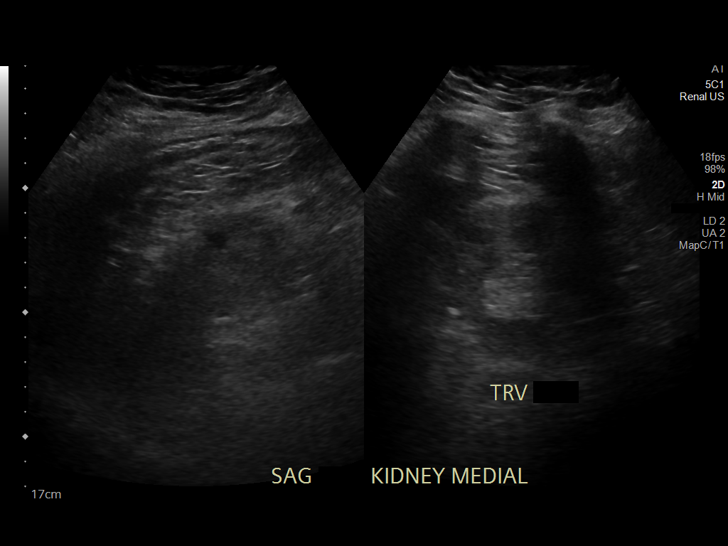
[im 46/56]
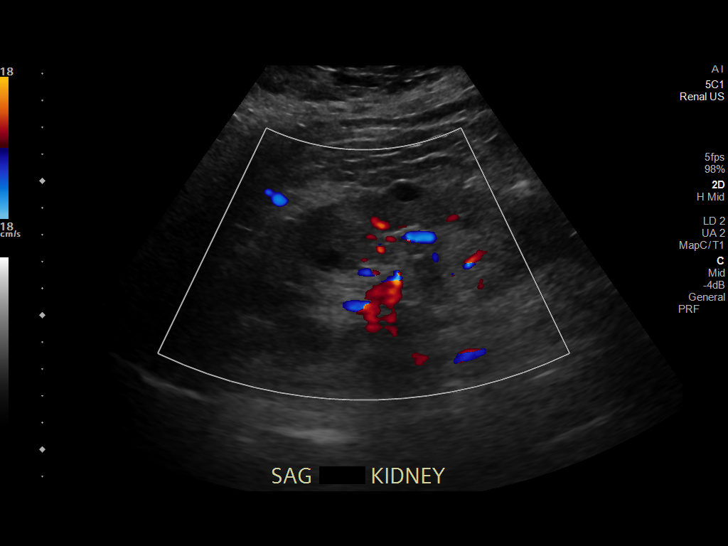
[im 51/56]
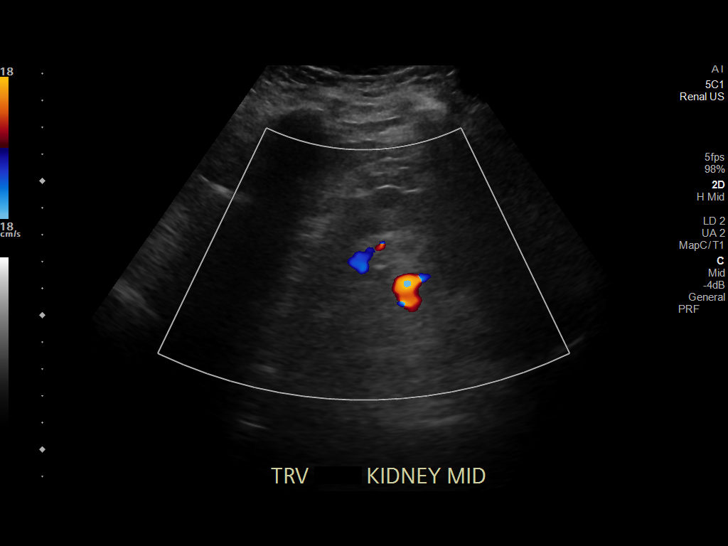
[im 56/56]
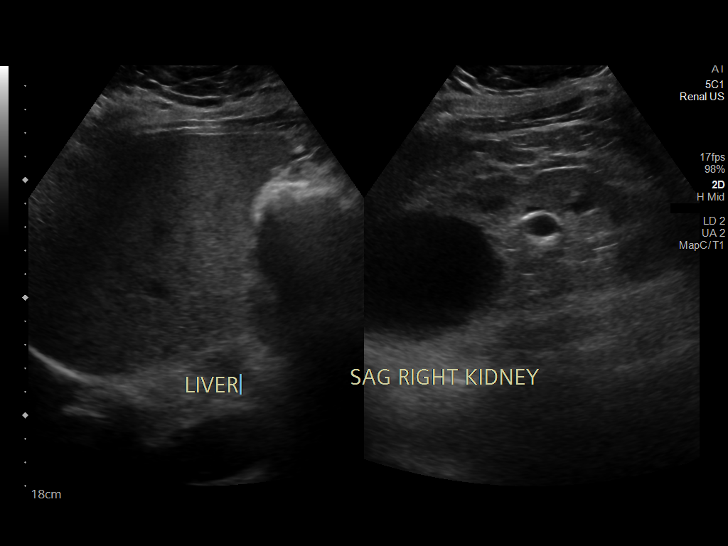

[14 of 25 positions shown; findings below may reference images not displayed]

FINDINGS: Right Kidney:

Renal measurements: 15.4 x 6.6 x 5.3 cm = volume: 280.90 mL. Mildly
increased echogenicity. There is a simple cyst in the upper pole
measuring 7.1 x 5.8 x 7.0 cm. There is an anechoic structure in the
midpole with rim calcification measuring 2.2 x 1.7 x 2.4 cm. No mass
or hydronephrosis visualized.

Left Kidney:

Renal measurements: 11.6 x 6.3 x 5.6 cm = volume: 215.84 mL. Mildly
increased echogenicity. A simple cyst is present in the mid to lower
pole measuring 1.1 x 0.9 x 1.0 cm. There is a simple cyst in the
midpole measuring 0.9 x 0.9 x 1.0 cm. No mass or hydronephrosis
visualized.

Bladder:

Appears normal for degree of bladder distention.

Other:

None.
IMPRESSION: 1. Mildly increased echogenicity of the kidneys suggesting medical
renal disease.
2. Bilateral renal cysts. There is minimally complex cyst in the mid
right kidney with a thin rim of calcification.

## 2021-02-16 IMAGING — MR MR HEAD W/O CM
6 of 10 series · 27 of 48 positions shown · non-contrast
Comparison: MRI head [DATE], correlation is also made with same
day CT head [DATE]

CLINICAL DATA: Difficulty speaking and using left arm stroke/TIA
suspected, determine embolic source

EXAM:
MRI HEAD WITHOUT CONTRAST
MRA HEAD WITHOUT CONTRAST
MRA NECK WITHOUT CONTRAST
TECHNIQUE: Multiplanar, multi-echo pulse sequences of the brain and surrounding
structures were acquired without intravenous contrast. Angiographic
images of the Circle of Willis were acquired using MRA technique
without intravenous contrast. Angiographic images of the neck were
acquired using MRA technique without intravenous contrast. Carotid
stenosis measurements (when applicable) are obtained utilizing
NASCET criteria, using the distal internal carotid diameter as the
denominator.

[Series 5: DWI · axial · 3.0mm · 0.94mm/px · z∈[-69,+86]mm · 9 of 107 slices shown (1 of 2)]
[im 1/107]
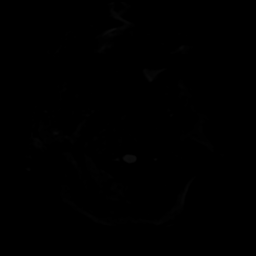
[im 14/107]
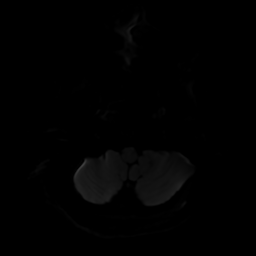
[im 27/107]
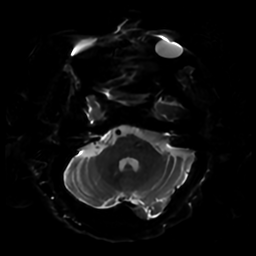
[im 40/107]
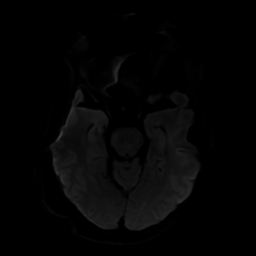
[im 54/107]
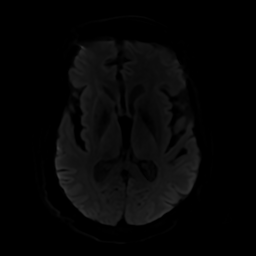
[im 67/107]
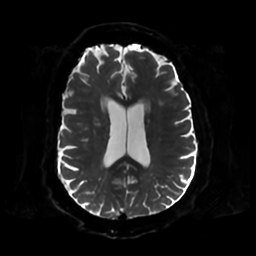
[im 80/107]
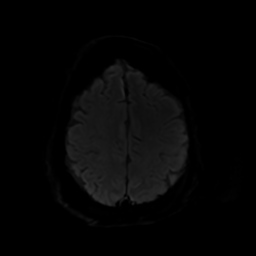
[im 93/107]
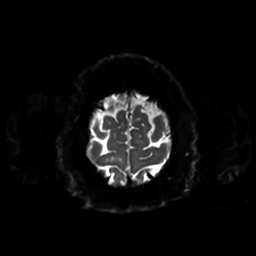
[im 107/107]
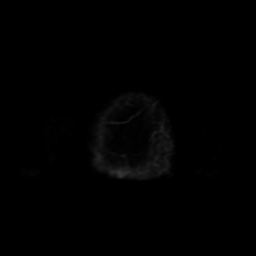

[Series 6: DWI · coronal · 4.0mm · 0.94mm/px · 7 of 78 slices shown (2 of 2)]
[im 1/78]
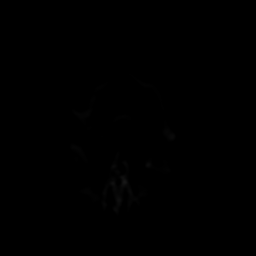
[im 13/78]
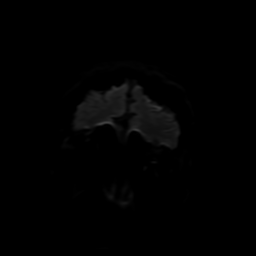
[im 26/78]
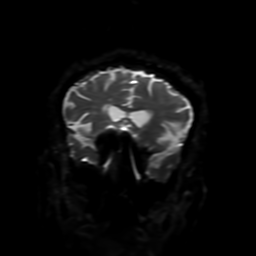
[im 39/78]
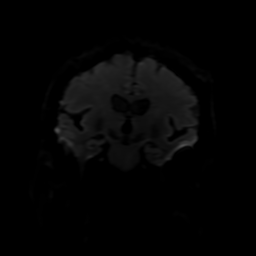
[im 52/78]
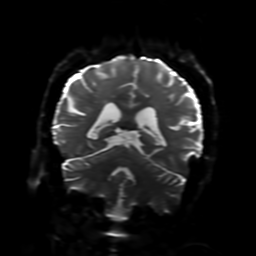
[im 65/78]
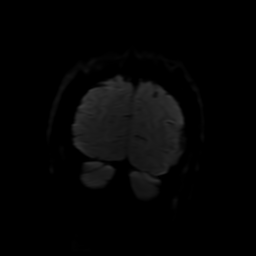
[im 78/78]
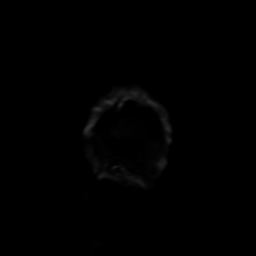

[Series 7: FLAIR · sagittal · 5.0mm · 0.23mm/px · 2 of 25 slices shown (1 of 2)]
[im 1/25]
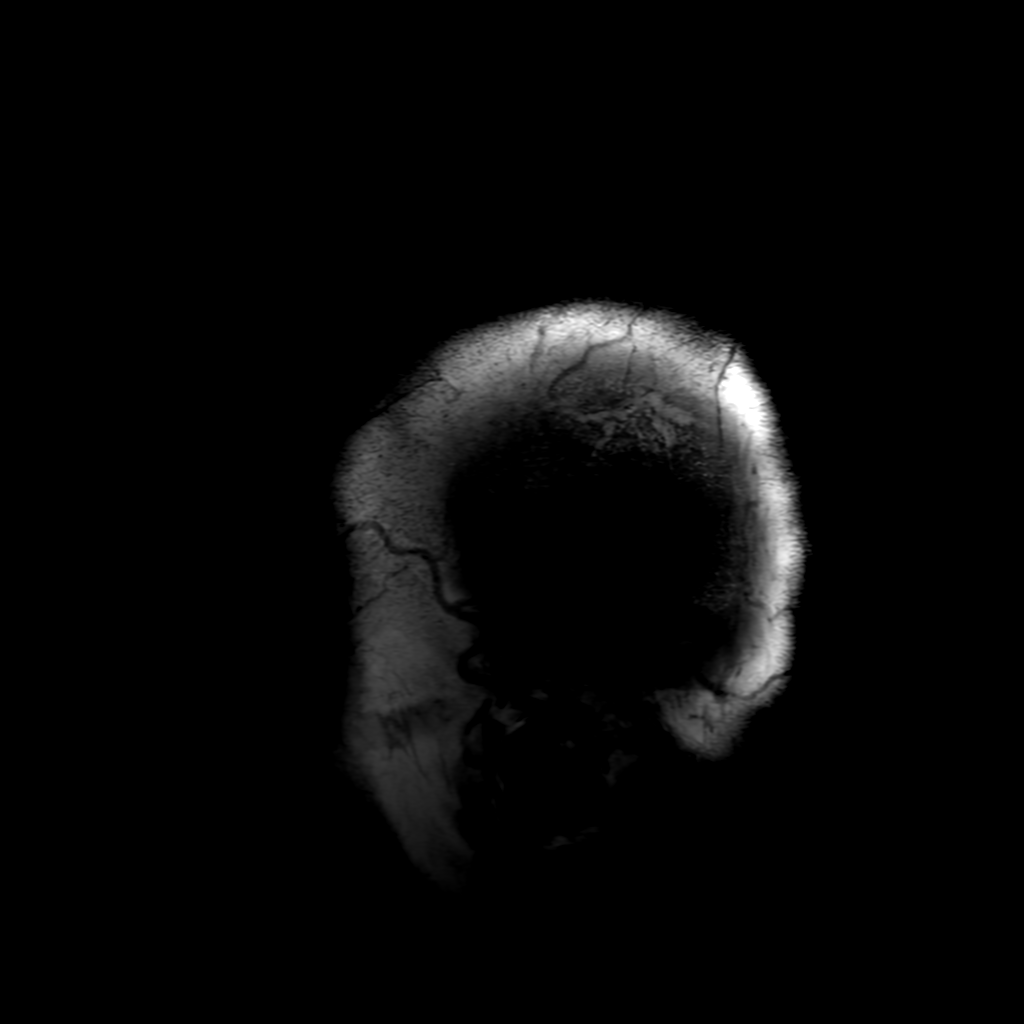
[im 25/25]
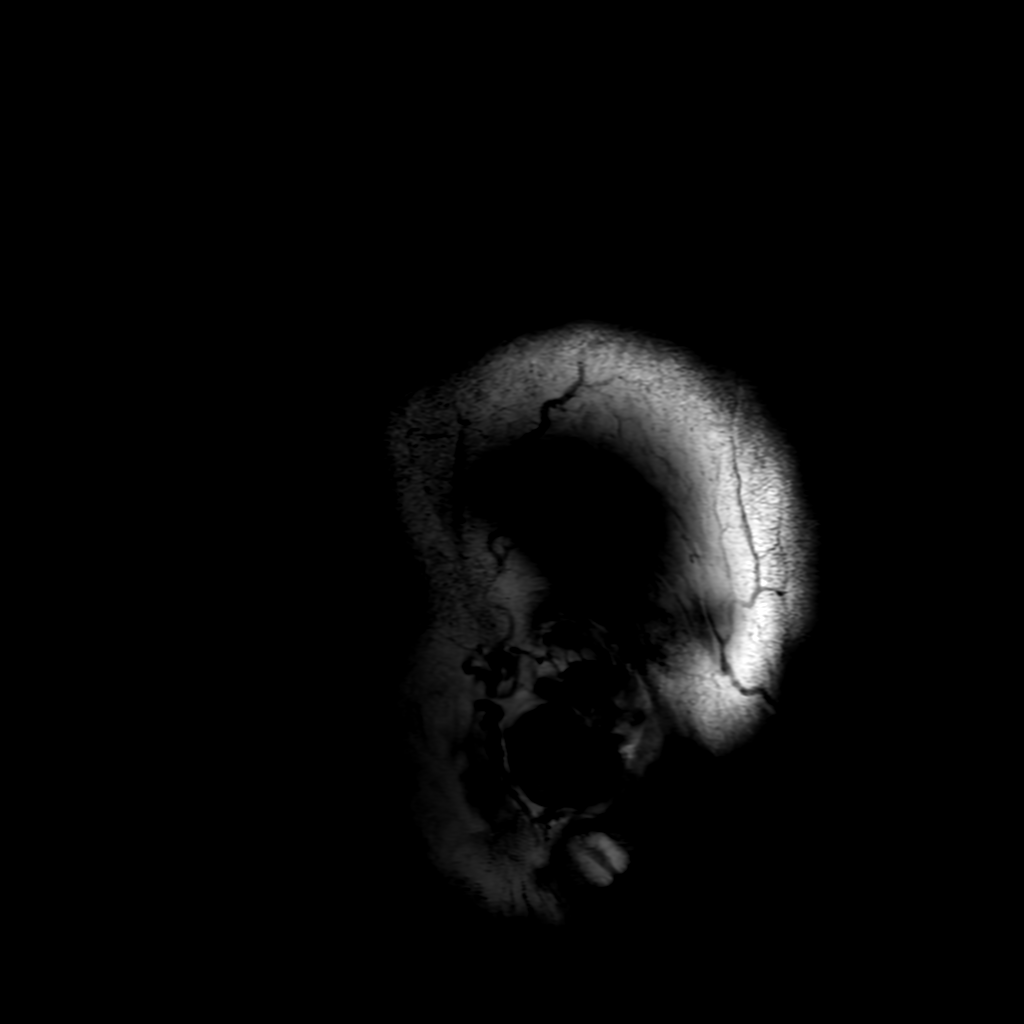

[Series 9: FLAIR · axial · 4.0mm · 0.45mm/px · z∈[-69,+86]mm · 3 of 37 slices shown (2 of 2)]
[im 1/37]
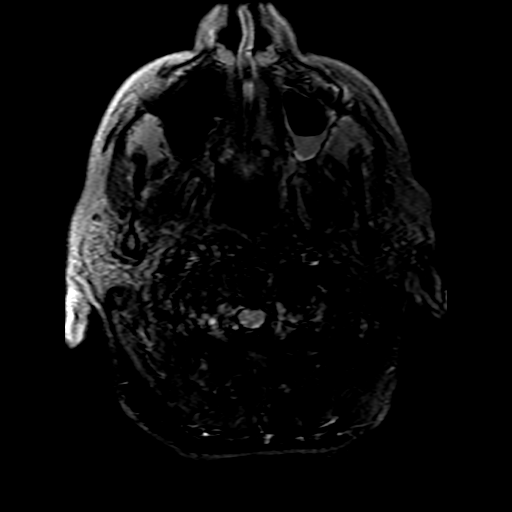
[im 19/37]
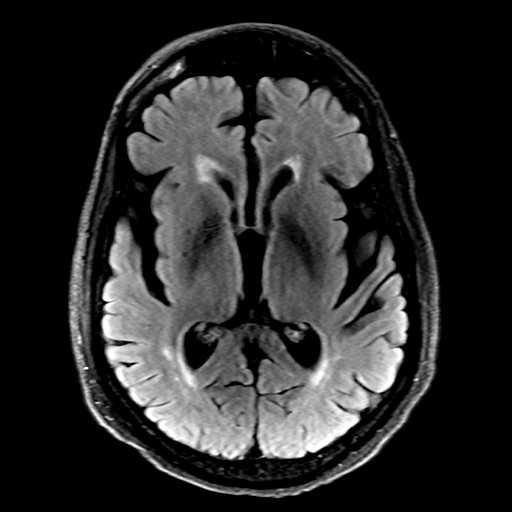
[im 37/37]
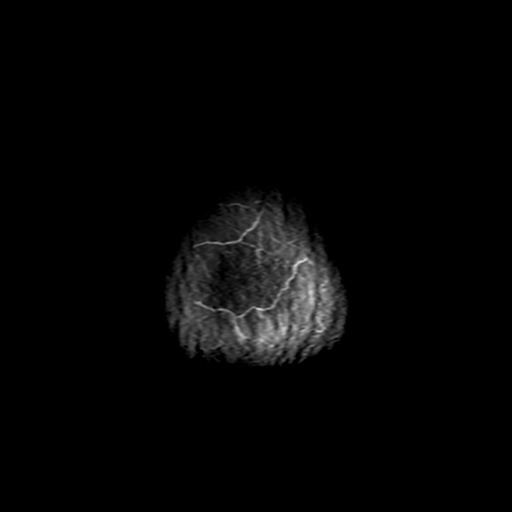

[Series 550: ADC · axial · 3.0mm · 0.94mm/px · z∈[-69,+86]mm · 5 of 54 slices shown (1 of 2)]
[im 1/54]
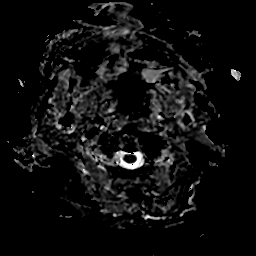
[im 14/54]
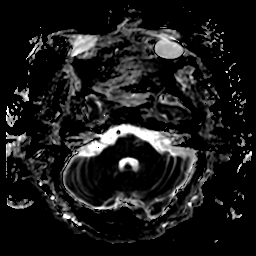
[im 27/54]
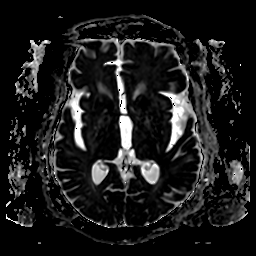
[im 40/54]
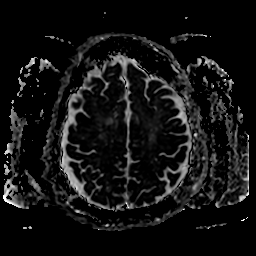
[im 54/54]
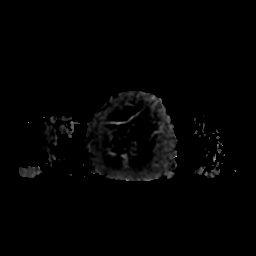

[Series 650: ADC · coronal · 4.0mm · 0.94mm/px · 1 of 39 slices shown (2 of 2)]
[im 1/39]
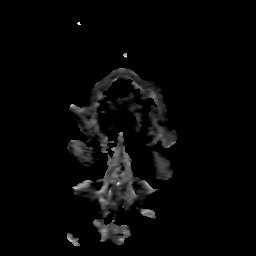

[27 of 48 positions shown; findings below may reference images not displayed]

FINDINGS: MRI HEAD FINDINGS

Brain: No restricted diffusion to suggest acute or subacute infarct.
No acute hemorrhage, mass, mass effect, or midline shift. No
hydrocephalus or extra-axial collection.

T2 hyperintense signal in the periventricular white matter and pons,
likely the sequela of moderate chronic small vessel ischemic
disease. Degree of cerebral atrophy is likely within normal limits
for age. Lacunar infarct in the right corona radiata, unchanged.

Vascular: Please see MRA findings below.

Skull and upper cervical spine: Normal marrow signal.

Sinuses/Orbits: Air-fluid level in the left maxillary sinus. Mucosal
thickening in the sphenoid sinuses and, to a lesser extent, ethmoid
air cells. The orbits are unremarkable.

Other: Fluid in the right mastoid air cells.

MRA HEAD FINDINGS

Anterior circulation: Both internal carotid arteries are patent to
the termini, with moderate narrowing of the left distal petrous and
proximal cavernous left ICA (series 2, image 113), likely secondary
to atherosclerotic plaque.

A1 segments patent. Normal anterior communicating artery. Anterior
cerebral arteries are patent to their distal aspects.

No M1 stenosis or occlusion. Normal MCA bifurcations. Distal MCA
branches perfused and symmetric.

Posterior circulation: Vertebral arteries patent to the
vertebrobasilar junction without stenosis. Basilar patent to its
distal aspect. Superior cerebellar arteries patent bilaterally.

Patent P1 segments. PCAs perfused to their distal aspects without
stenosis. The bilateral posterior communicating arteries are
visualized.

Anatomic variants: None significant

MRA NECK FINDINGS

Aortic arch: Limited visualization. Three-vessel arch without
evidence of dissection or definite aneurysm.

Right carotid system: No evidence of dissection, occlusion or
hemodynamically significant stenosis (greater than 50%). Likely
calcified plaque at the bifurcation and in the proximal (series 4,
image 71) and mid (series 4, image 41) right ICA causes less than
50% luminal narrowing.

Left carotid system: No evidence of dissection, occlusion or
hemodynamically significant stenosis (greater than 50%). Plaque in
the proximal (series 4, image 73) and distal (series 4, image 38)
left ICA causes less than 50% luminal narrowing.

Vertebral arteries: The origins of the vertebral arteries are not
well evaluated secondary to artifact. The bilateral V2 and V3
segments are patent, with multifocal irregularity primarily in the
V2 segments. No evidence of dissection, occlusion or hemodynamically
significant stenosis (greater than 50%).

Other: None
IMPRESSION: 1.  No acute intracranial process.
2. Moderate narrowing of the distal left petrous and proximal left
cavernous ICA. No intracranial large vessel occlusion or other
significant stenosis.
3. No hemodynamically significant stenosis in the neck. Narrowing in
the bilateral ICAs and vertebral arteries causes less than 50%
luminal narrowing.
4. Air-fluid level in the left maxillary sinus, as can be seen in
the setting of acute sinusitis. Correlate with symptoms.

## 2021-02-16 IMAGING — MR MR MRA NECK W/O CM
2 series · 17 of 48 positions shown · non-contrast
Comparison: MRI head [DATE], correlation is also made with same
day CT head [DATE]

CLINICAL DATA: Difficulty speaking and using left arm stroke/TIA
suspected, determine embolic source

EXAM:
MRI HEAD WITHOUT CONTRAST
MRA HEAD WITHOUT CONTRAST
MRA NECK WITHOUT CONTRAST
TECHNIQUE: Multiplanar, multi-echo pulse sequences of the brain and surrounding
structures were acquired without intravenous contrast. Angiographic
images of the Circle of Willis were acquired using MRA technique
without intravenous contrast. Angiographic images of the neck were
acquired using MRA technique without intravenous contrast. Carotid
stenosis measurements (when applicable) are obtained utilizing
NASCET criteria, using the distal internal carotid diameter as the
denominator.

[Series 4: TOF · axial · 2.4mm · 0.47mm/px · z∈[-245,-43]mm · 14 of 180 slices shown (1 of 2)]
[im 1/180]
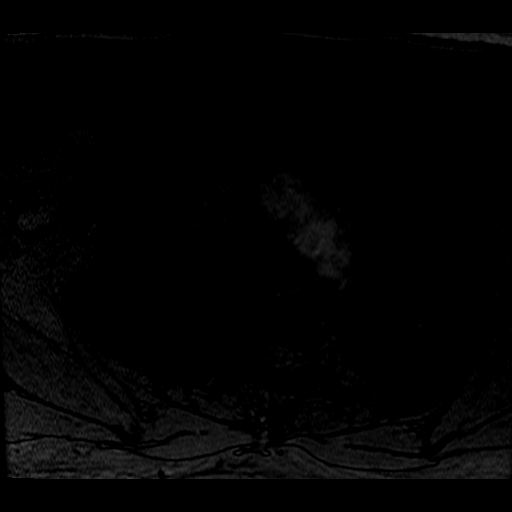
[im 5/180]
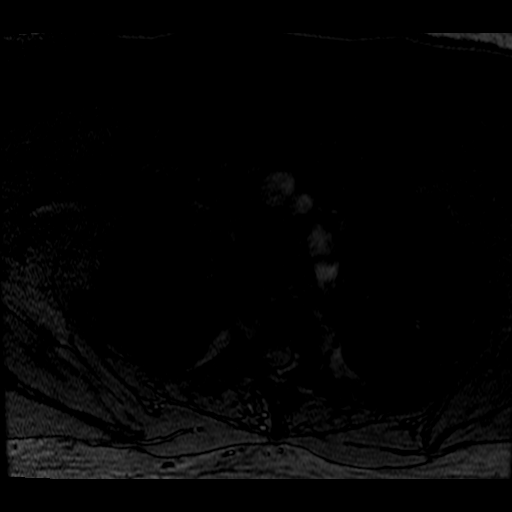
[im 9/180]
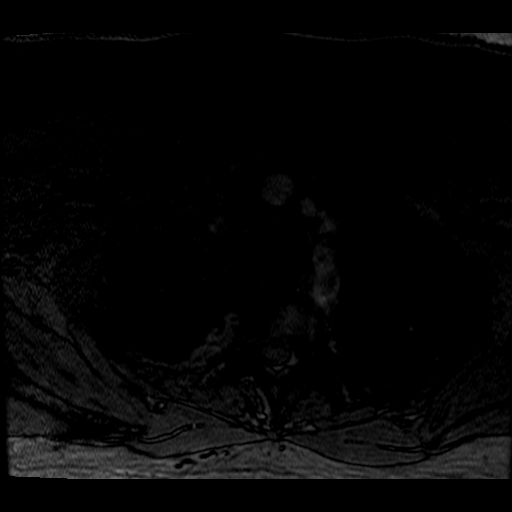
[im 13/180]
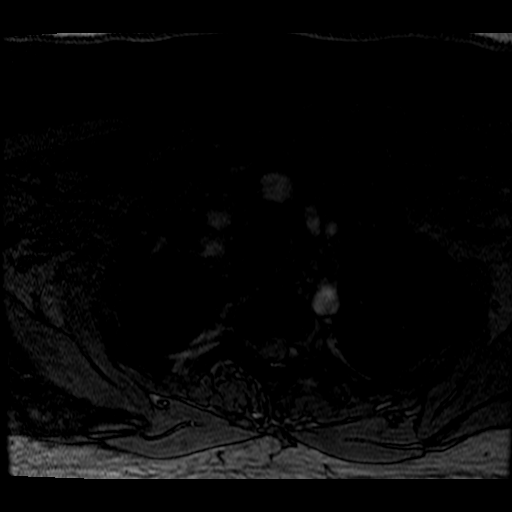
[im 29/180]
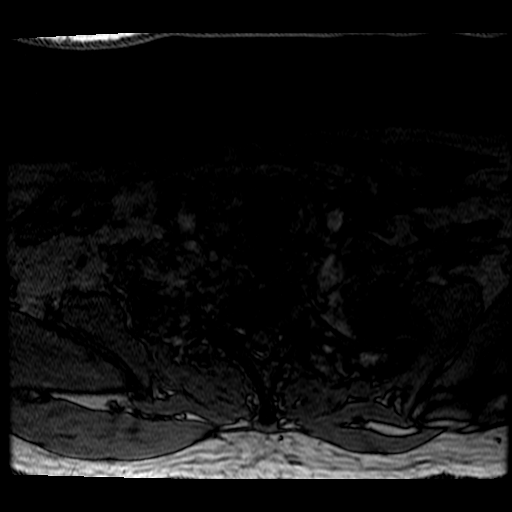
[im 33/180]
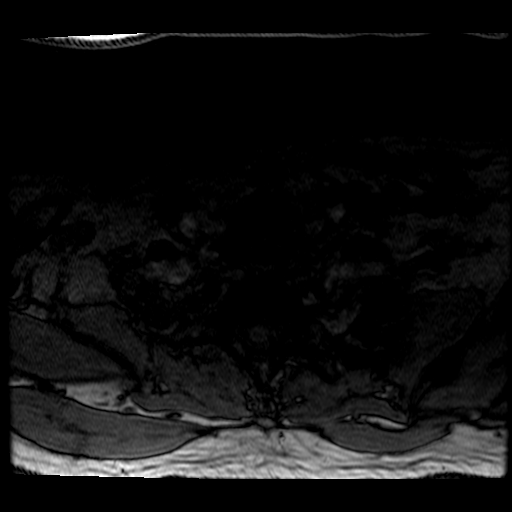
[im 57/180]
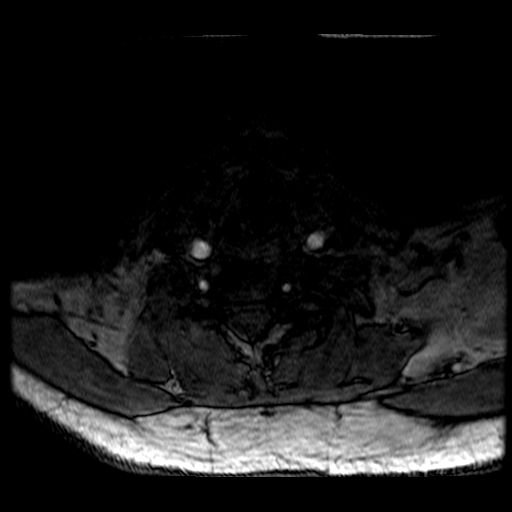
[im 78/180]
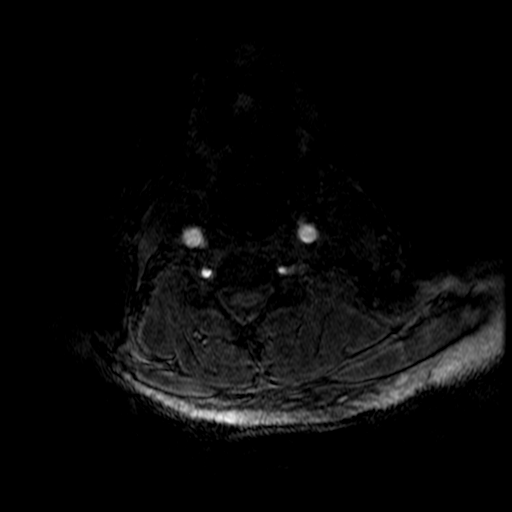
[im 90/180]
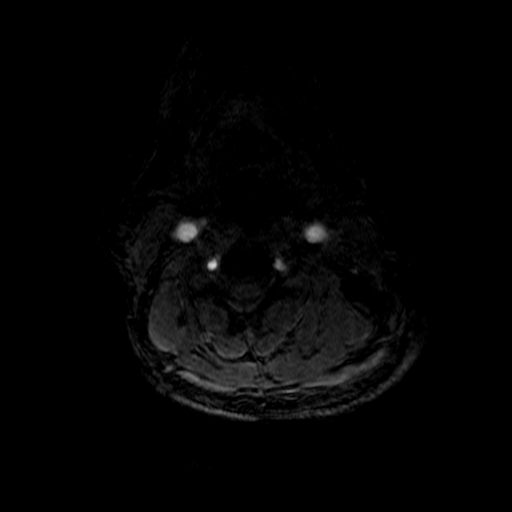
[im 102/180]
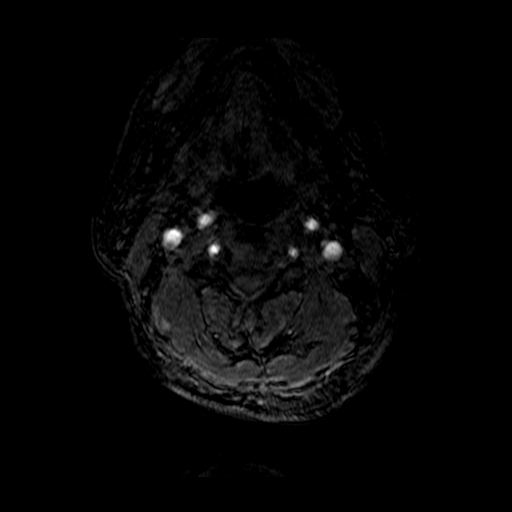
[im 123/180]
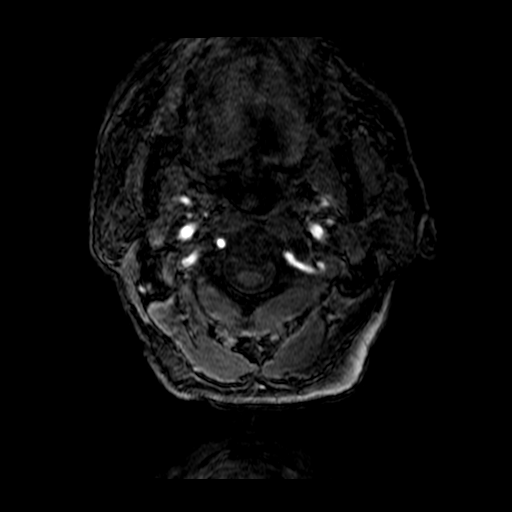
[im 147/180]
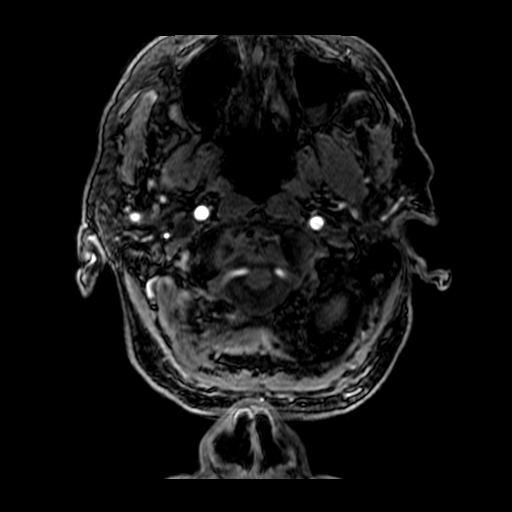
[im 151/180]
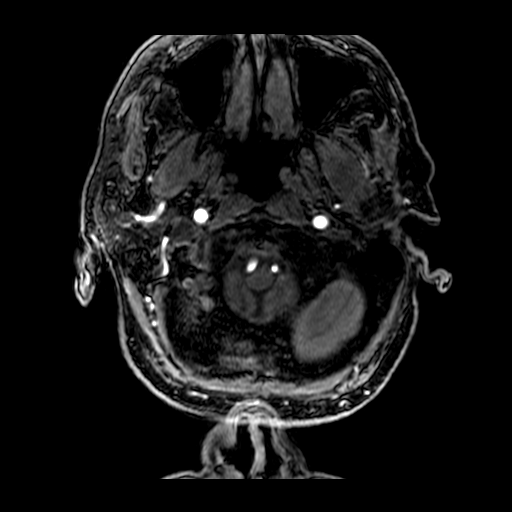
[im 171/180]
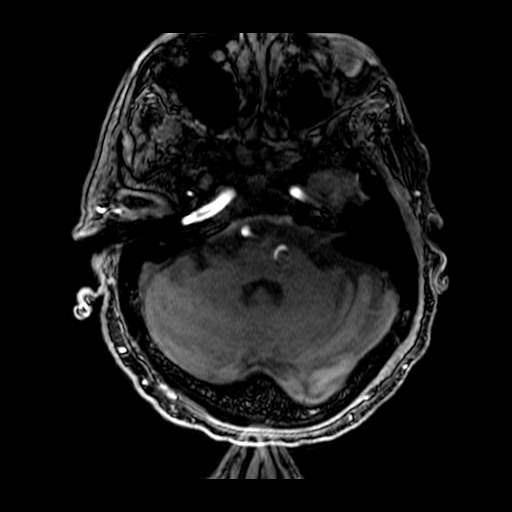

[Series 401: TOF · sagittal · 2.4mm · 0.47mm/px · 3 of 10 slices shown (2 of 2)]
[im 1/10]
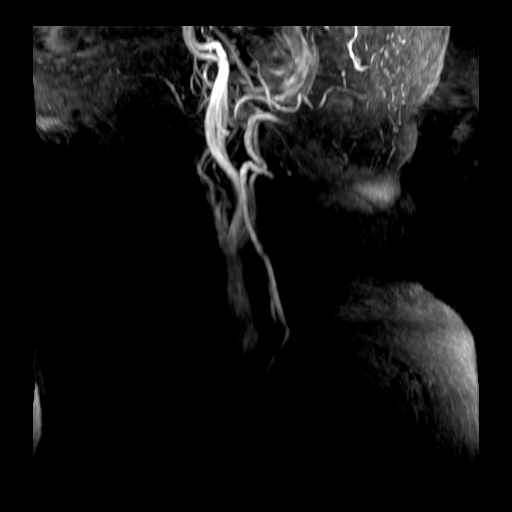
[im 5/10]
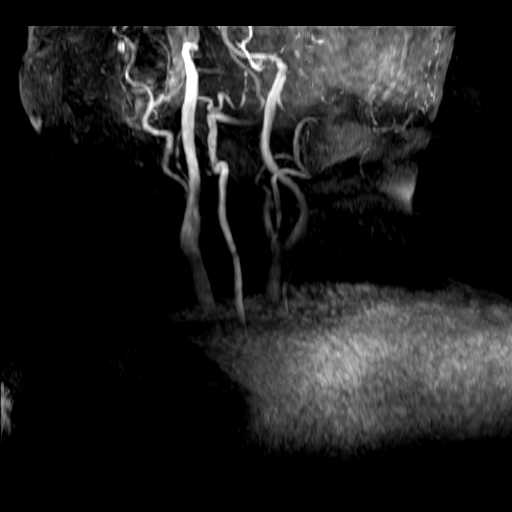
[im 10/10]
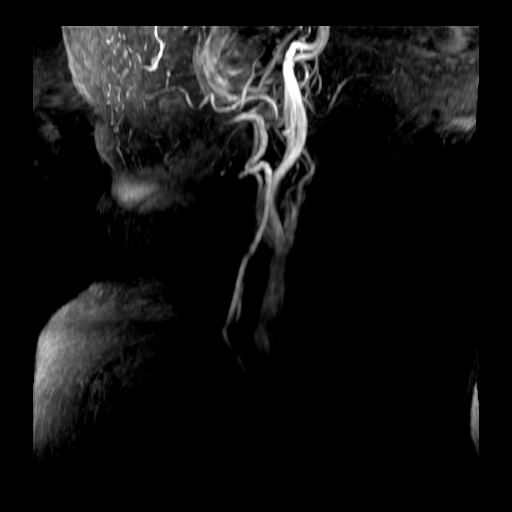

[17 of 48 positions shown; findings below may reference images not displayed]

FINDINGS: MRI HEAD FINDINGS

Brain: No restricted diffusion to suggest acute or subacute infarct.
No acute hemorrhage, mass, mass effect, or midline shift. No
hydrocephalus or extra-axial collection.

T2 hyperintense signal in the periventricular white matter and pons,
likely the sequela of moderate chronic small vessel ischemic
disease. Degree of cerebral atrophy is likely within normal limits
for age. Lacunar infarct in the right corona radiata, unchanged.

Vascular: Please see MRA findings below.

Skull and upper cervical spine: Normal marrow signal.

Sinuses/Orbits: Air-fluid level in the left maxillary sinus. Mucosal
thickening in the sphenoid sinuses and, to a lesser extent, ethmoid
air cells. The orbits are unremarkable.

Other: Fluid in the right mastoid air cells.

MRA HEAD FINDINGS

Anterior circulation: Both internal carotid arteries are patent to
the termini, with moderate narrowing of the left distal petrous and
proximal cavernous left ICA (series 2, image 113), likely secondary
to atherosclerotic plaque.

A1 segments patent. Normal anterior communicating artery. Anterior
cerebral arteries are patent to their distal aspects.

No M1 stenosis or occlusion. Normal MCA bifurcations. Distal MCA
branches perfused and symmetric.

Posterior circulation: Vertebral arteries patent to the
vertebrobasilar junction without stenosis. Basilar patent to its
distal aspect. Superior cerebellar arteries patent bilaterally.

Patent P1 segments. PCAs perfused to their distal aspects without
stenosis. The bilateral posterior communicating arteries are
visualized.

Anatomic variants: None significant

MRA NECK FINDINGS

Aortic arch: Limited visualization. Three-vessel arch without
evidence of dissection or definite aneurysm.

Right carotid system: No evidence of dissection, occlusion or
hemodynamically significant stenosis (greater than 50%). Likely
calcified plaque at the bifurcation and in the proximal (series 4,
image 71) and mid (series 4, image 41) right ICA causes less than
50% luminal narrowing.

Left carotid system: No evidence of dissection, occlusion or
hemodynamically significant stenosis (greater than 50%). Plaque in
the proximal (series 4, image 73) and distal (series 4, image 38)
left ICA causes less than 50% luminal narrowing.

Vertebral arteries: The origins of the vertebral arteries are not
well evaluated secondary to artifact. The bilateral V2 and V3
segments are patent, with multifocal irregularity primarily in the
V2 segments. No evidence of dissection, occlusion or hemodynamically
significant stenosis (greater than 50%).

Other: None
IMPRESSION: 1.  No acute intracranial process.
2. Moderate narrowing of the distal left petrous and proximal left
cavernous ICA. No intracranial large vessel occlusion or other
significant stenosis.
3. No hemodynamically significant stenosis in the neck. Narrowing in
the bilateral ICAs and vertebral arteries causes less than 50%
luminal narrowing.
4. Air-fluid level in the left maxillary sinus, as can be seen in
the setting of acute sinusitis. Correlate with symptoms.

## 2021-02-16 IMAGING — CT CT HEAD W/O CM
4 of 5 series · 15 of 47 positions shown, 17 images · non-contrast
Comparison: Head CT dated [DATE].

CLINICAL DATA: Acute neuro deficit, stroke suspected.



[Series 3: head without · axial · non-contrast · 0.45mm/px · z∈[-58,+42]mm · 4 of 34 slices shown (1 of 2)]
[im 7/34  brain]
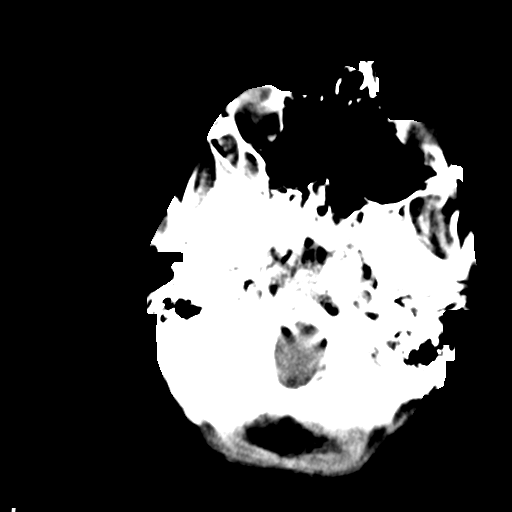
[im 14/34  brain]
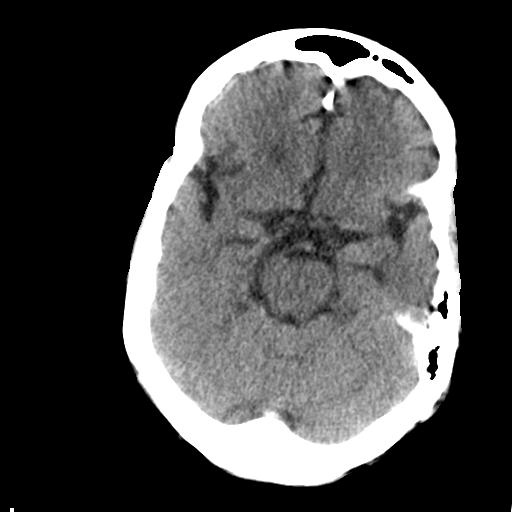
[im 20/34  brain]
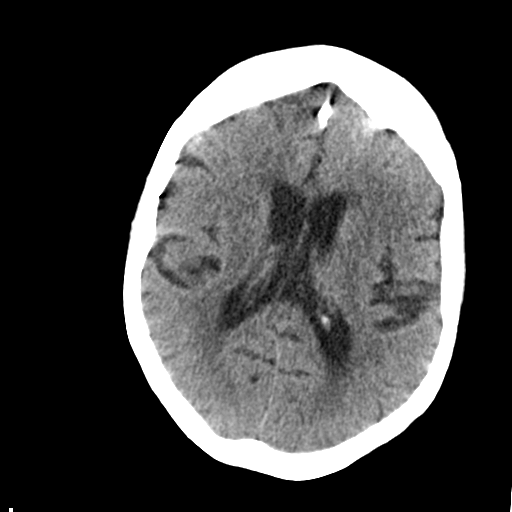
[im 27/34  brain]
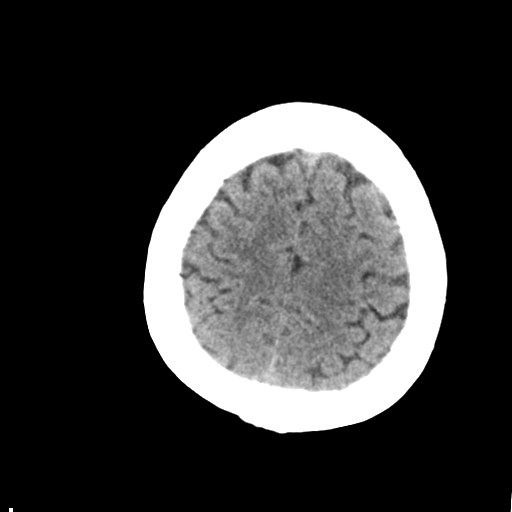

[Series 4: head without · axial · non-contrast · 0.45mm/px · z∈[-63,+47]mm · 5 of 34 slices shown, 7 images (2 of 2)]
[im 6/34  brain]
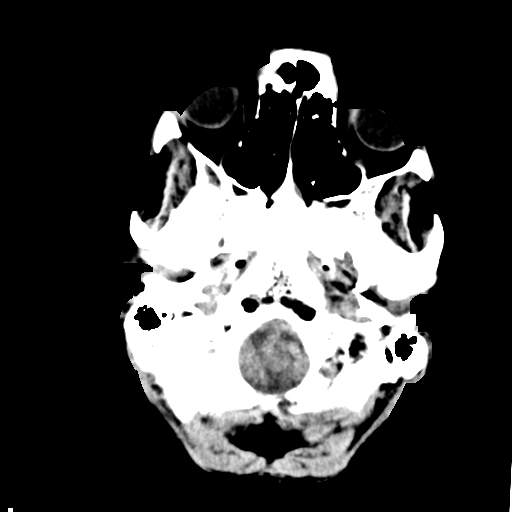
[im 6/34  bone]
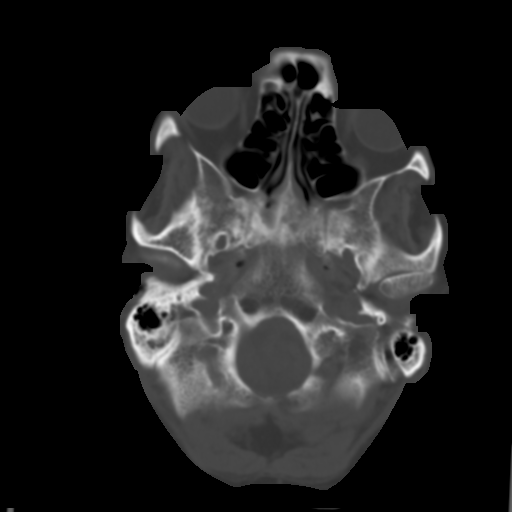
[im 12/34  brain]
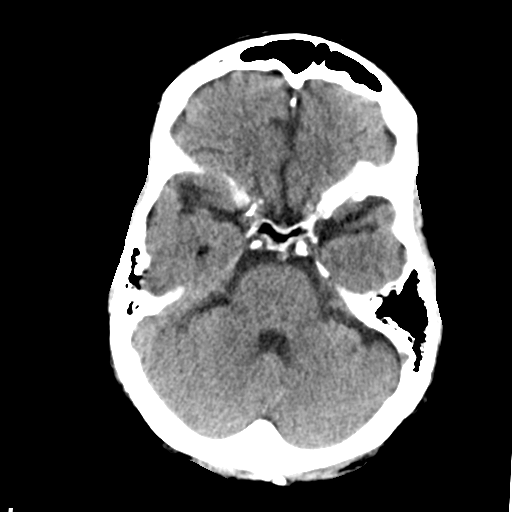
[im 17/34  brain]
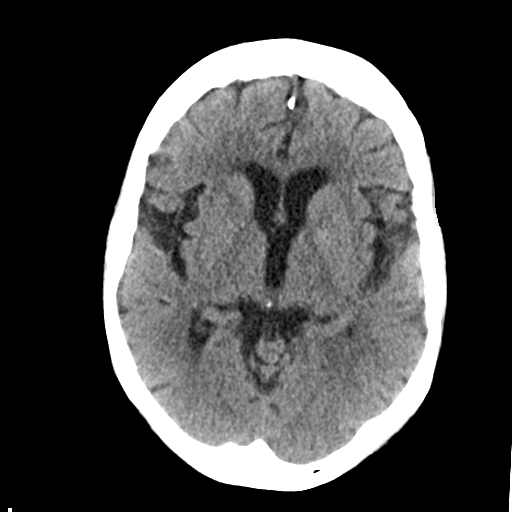
[im 23/34  brain]
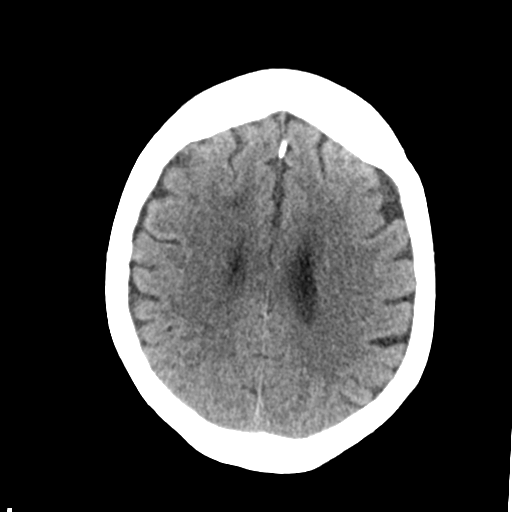
[im 28/34  brain]
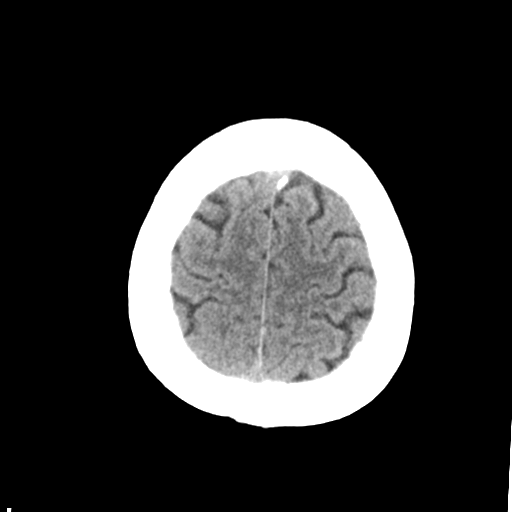
[im 28/34  bone]
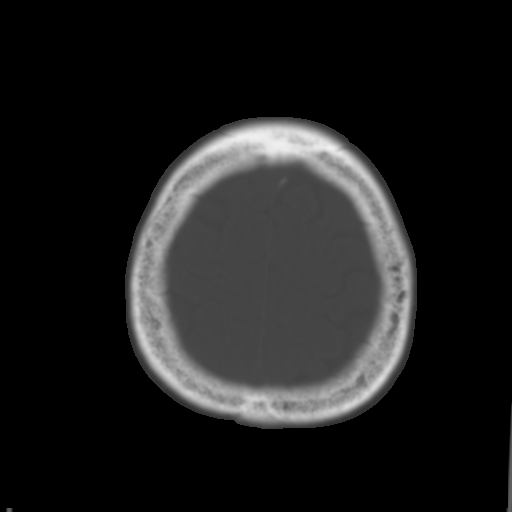

[Series 6: head without cor · coronal · non-contrast · 0.33mm/px · 3 of 73 slices shown]
[im 25/73  brain]
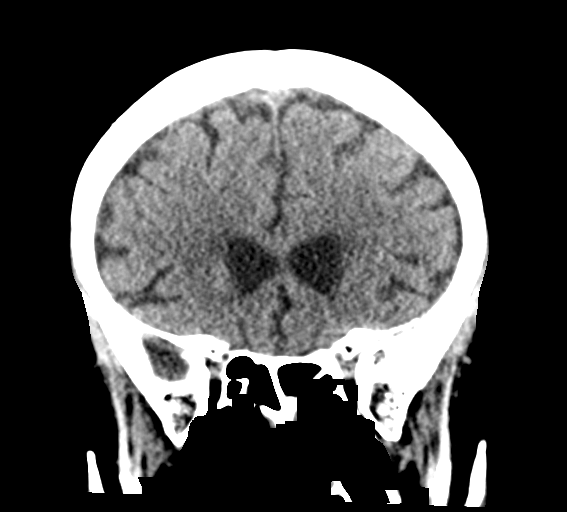
[im 33/73  brain]
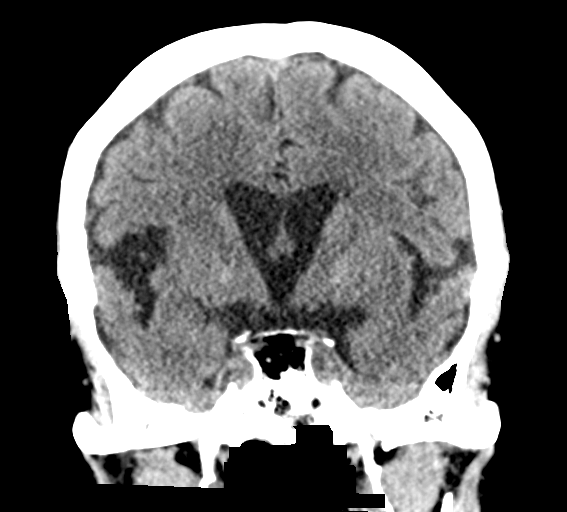
[im 41/73  brain]
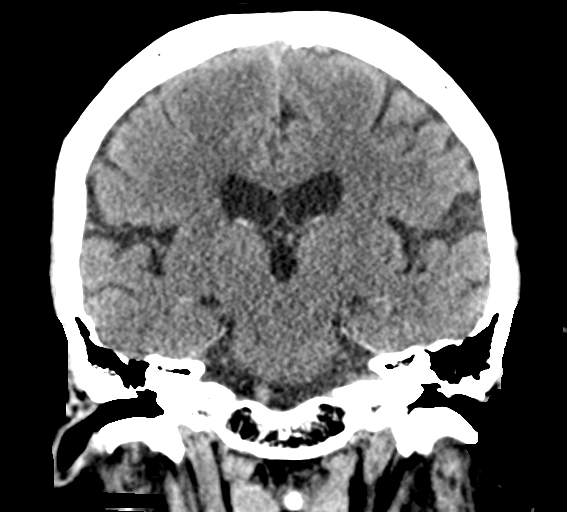

[Series 7: head without sag · sagittal · non-contrast · 0.33mm/px · 3 of 59 slices shown]
[im 20/59  brain]
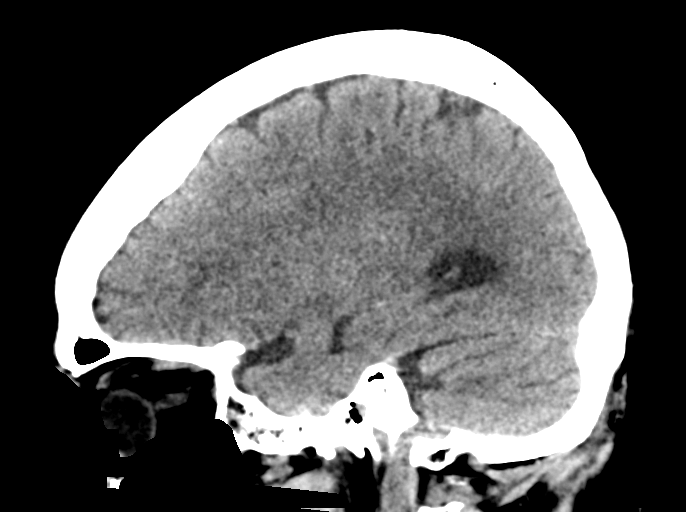
[im 30/59  brain]
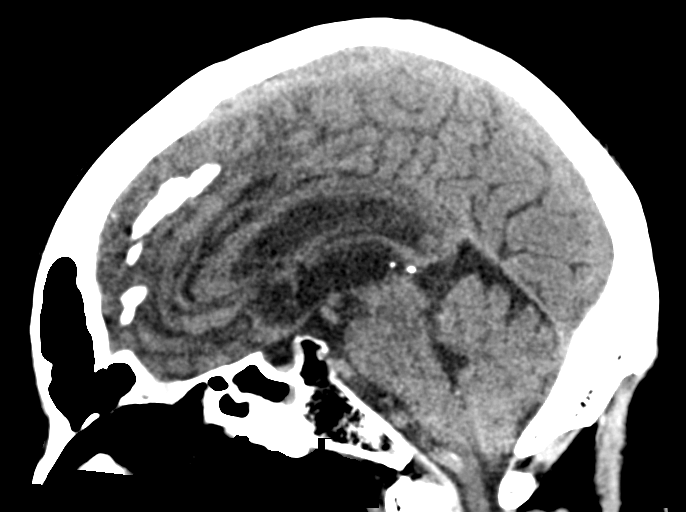
[im 39/59  brain]
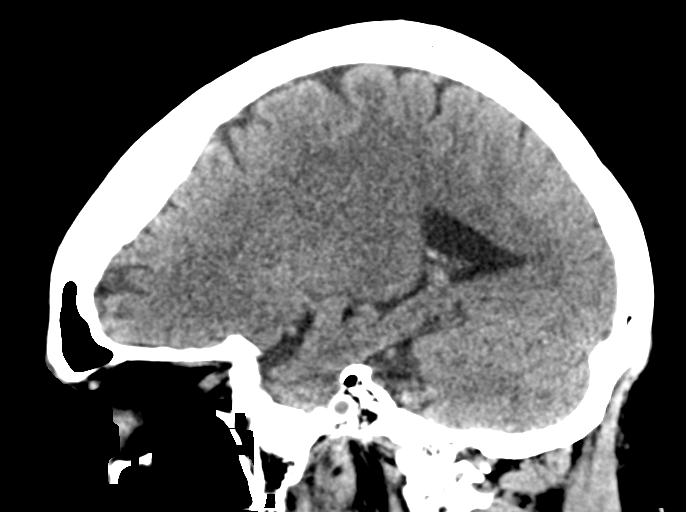

[15 of 47 positions shown; findings below may reference images not displayed]

FINDINGS: Brain: Generalized age related parenchymal volume loss with
commensurate dilatation of the ventricles and sulci. Ventricles are
stable in size and configuration. Mild chronic small vessel ischemic
changes within the bilateral periventricular white matter regions.
No mass, hemorrhage, edema or other evidence of acute parenchymal
abnormality. No extra-axial hemorrhage.

Vascular: Chronic calcified atherosclerotic changes of the large
vessels at the skull base. No unexpected hyperdense vessel.

Skull: Normal. Negative for fracture or focal lesion.

Sinuses/Orbits: No acute finding.

Other: None.
IMPRESSION: 1. No acute findings. No intracranial mass, hemorrhage or edema.
2. Chronic small vessel ischemic changes in the white matter.

## 2021-02-16 MED ORDER — ASPIRIN 325 MG PO TABS
325.0000 mg | ORAL_TABLET | Freq: Every day | ORAL | Status: DC
Start: 2021-02-16 — End: 2021-02-16

## 2021-02-16 MED ORDER — ASPIRIN 325 MG PO TABS
325.0000 mg | ORAL_TABLET | Freq: Once | ORAL | Status: AC
  Administered 2021-02-16: 325 mg via ORAL
  Filled 2021-02-16: qty 1

## 2021-02-16 MED ORDER — ASPIRIN EC 81 MG PO TBEC
81.0000 mg | DELAYED_RELEASE_TABLET | Freq: Every day | ORAL | Status: DC
  Administered 2021-02-17 – 2021-02-18 (×2): 81 mg via ORAL
  Filled 2021-02-16 (×2): qty 1

## 2021-02-16 MED ORDER — HEPARIN SODIUM (PORCINE) 5000 UNIT/ML IJ SOLN
5000.0000 [IU] | Freq: Three times a day (TID) | INTRAMUSCULAR | Status: DC
  Administered 2021-02-16: 5000 [IU] via SUBCUTANEOUS
  Filled 2021-02-16: qty 1

## 2021-02-16 MED ORDER — SODIUM CHLORIDE 0.9 % IV BOLUS
1000.0000 mL | Freq: Once | INTRAVENOUS | Status: AC
  Administered 2021-02-16: 1000 mL via INTRAVENOUS

## 2021-02-16 MED ORDER — ROSUVASTATIN CALCIUM 5 MG PO TABS
10.0000 mg | ORAL_TABLET | Freq: Every day | ORAL | Status: DC
  Administered 2021-02-17 – 2021-02-18 (×2): 10 mg via ORAL
  Filled 2021-02-16 (×2): qty 2

## 2021-02-16 MED ORDER — LACTATED RINGERS IV BOLUS
500.0000 mL | Freq: Once | INTRAVENOUS | Status: AC
  Administered 2021-02-16: 500 mL via INTRAVENOUS

## 2021-02-16 MED ORDER — PANTOPRAZOLE SODIUM 40 MG PO TBEC
40.0000 mg | DELAYED_RELEASE_TABLET | Freq: Every day | ORAL | Status: DC
  Administered 2021-02-16 – 2021-02-19 (×4): 40 mg via ORAL
  Filled 2021-02-16 (×4): qty 1

## 2021-02-16 MED ORDER — ASPIRIN 325 MG PO TABS: 325.0000 mg | ORAL_TABLET | Freq: Once | ORAL | Status: DC

## 2021-02-16 MED ORDER — TAMSULOSIN HCL 0.4 MG PO CAPS
0.4000 mg | ORAL_CAPSULE | Freq: Every day | ORAL | Status: DC
  Administered 2021-02-17 – 2021-02-18 (×2): 0.4 mg via ORAL
  Filled 2021-02-16 (×2): qty 1

## 2021-02-16 MED ORDER — CLOPIDOGREL BISULFATE 75 MG PO TABS
75.0000 mg | ORAL_TABLET | Freq: Every day | ORAL | Status: DC
  Administered 2021-02-16: 75 mg via ORAL
  Filled 2021-02-16: qty 1

## 2021-02-16 NOTE — ED Notes (Signed)
Patient transported to CT 

## 2021-02-16 NOTE — ED Notes (Signed)
Admitting MD team at [facility]

## 2021-02-16 NOTE — Plan of Care (Addendum)
Neurology attempted to see patient however he was in MRI.   Electronically signed by:  Lynnae Sandhoff, MD Page: [phone] 02/16/2021, 6:15 PM

## 2021-02-16 NOTE — Consult Note (Signed)
NEUROLOGY CONSULTATION NOTE   Date of service: February 16, 2021 Patient Name: Jacob [name] MRN:  [id number] DOB:  1945-07-30 Reason for consult: "L sided weakness and dysarthric speech" Requesting Provider: Charise Killian, MD _ _ _   _ __   _ __ _ _  __ __   _ __   __ _  History of Present Illness  Jacob [name] is a 76 y.o. male with PMH significant for HTN, OSA, PUD, GERD, HLD who presents with confusion.  Patient is oriented x 3 but is having difficulty with attention and feels like hs loses his train of thoughts.  History obtained from patient but I also spoke to his wife and his son over the phone to get collateral history.  He has been sick for the last couple weeks.  Is noted to be more lethargic and tired and less interactive.  He is also having tremoring of his arms for the last couple weeks.  Today, he appeared progressively more confused.  He got up from his recliner and felt like he was going to pass out.  He sat back down but he had persistent confusion and did not seem like he was going to get better and wife called EMS.  He was slurring his speech today.  He does endorse that his left arm is tremoring more than his right arm for the last couple weeks but denies any weakness in his left upper extremity.  He denies dropping any objects from his hands over the last couple weeks and family endorses that.  Family is not aware of any left-sided weakness.  No new medications except that he was recently started on "water pill"    ROS   Constitutional Denies weight loss, fever and chills.   HEENT Denies changes in vision and hearing.   Respiratory Denies SOB and cough.   CV Denies palpitations and CP   GI Denies abdominal pain, nausea, vomiting and diarrhea.   GU Denies dysuria and urinary frequency.   MSK Denies myalgia and joint pain.   Skin Denies rash and pruritus.   Neurological Denies headache and syncope.   Psychiatric Denies recent changes in mood. Denies  anxiety and depression.    Past History   Past Medical History:  Diagnosis Date   Anemia    hx iron deficiency   Anxiety    Arthritis    gout   Back pain    BPH with obstruction/lower urinary tract symptoms    Chronic kidney disease    "they said I do"   Constipation    Depression    ED (erectile dysfunction)    Epigastric pain    GERD (gastroesophageal reflux disease)    Heartburn    History of blood transfusion    History of radiation therapy 11/03/11-12/29/11   prostate   Hypercholesterolemia    Hypertension    Night sweats    Oxygen deficiency    2x per week at night for sleep   Post-operative nausea and vomiting 04/09/2016   Prostate cancer ([place]) 08/14/11   Adenocarcinoma,gleason:3+3=6,& 3+4=7,PSA=5.66   PUD (peptic ulcer disease)    Sleep apnea    does not use Cpap but does use O2 @ 2l    Stomach cancer ([place])    Ulcer    peptic ulcer hx   Past Surgical History:  Procedure Laterality Date   colon polyps bx  11/24/06   colon,transverse and rectosigmoid polyps:tubular adenomas and hyperplastic polyps,no high grade dysplasia or malignancy  COLONOSCOPY WITH PROPOFOL N/A 08/24/2019   Procedure: COLONOSCOPY WITH PROPOFOL;  Surgeon: [name], DO;  Location: [facility];  Service: Gastroenterology;  Laterality: N/A;   duodenal bx  11/24/06   benign   ESOPHAGOGASTRODUODENOSCOPY N/A 10/27/2015   Procedure: ESOPHAGOGASTRODUODENOSCOPY (EGD);  Surgeon: [name], MD;  Location: [facility];  Service: Endoscopy;  Laterality: N/A;   EUS N/A 11/08/2015   Procedure: UPPER ENDOSCOPIC ULTRASOUND (EUS) RADIAL;  Surgeon: [name], MD;  Location: [facility];  Service: Endoscopy;  Laterality: N/A;   GASTRECTOMY N/A 03/25/2016   Procedure: DISTAL GASTRECTOMY;  Surgeon: [name], MD;  Location: [place] [facility];  Service: General;  Laterality: N/A;   gastric bx  11/24/06   chronic active gastritis,with metaplasia and focal changaes of xanthelasma   GASTROSTOMY N/A  03/25/2016   Procedure: INSERTION OF FEEDING TUBE;  Surgeon: [name], MD;  Location: [facility];  Service: General;  Laterality: N/A;   INSERTION PROSTATE RADIATION SEED  12-29-11   IR [place] HISTORICAL  04/01/2016   IR [place] TUBE CHANGE 04/01/2016 Markus Daft, MD [facility]   IR [place]IC HISTORICAL  04/05/2016   IR [place] TUBE CHANGE 04/05/2016 Markus Daft, MD [facility]   LAPAROSCOPIC GASTRECTOMY  03/25/2016   Diagnostic laparoscopy, distal gastrectomy with Billroth 2 reconstruction and gastrojejunostomy tube   LAPAROSCOPY N/A 03/25/2016   Procedure: DIAGNOSTIC LAPAROSCOPY;  Surgeon: [name], MD;  Location: [facility];  Service: General;  Laterality: N/A;   POLYPECTOMY  08/24/2019   Procedure: POLYPECTOMY;  Surgeon: [name], DO;  Location: [facility];  Service: Gastroenterology;;   PORTACATH PLACEMENT Left 12/04/2015   Procedure: INSERTION PORT-A-CATH;  Surgeon: [name], MD;  Location: [facility];  Service: General;  Laterality: Left;   PROSTATE BIOPSY  08/14/11   Adenocarcinoma/volume=58.51cc,gleason=3+3=6 & 3+4=7   TONSILLECTOMY     76 years old   Family History  Problem Relation Age of Onset   Cancer Mother        NOS   Alcohol abuse Father    Alcohol abuse Brother 18   Cancer Paternal Aunt        NOS   Colon cancer Neg Hx    Social History   Socioeconomic History   Marital status: Married    Spouse name: Jacob [name]   Number of children: 3   Years of education: 12   Highest education level: Not on file  Occupational History   Occupation: retired    Fish farm manager: [employer]  Tobacco Use   Smoking status: Former    Packs/day: 1.50    Years: 30.00    Pack years: 45.00    Types: Cigarettes    Quit date: 04/13/2000    Years since quitting: 20.8   Smokeless tobacco: Never  Vaping Use   Vaping Use: Never used  Substance and Sexual Activity   Alcohol use: Not Currently    Alcohol/week: 1.0 standard drink    Types: 1 Cans of beer per week    Comment: a beer about once a  week, less than in his youth   Drug use: No   Sexual activity: Not Currently  Other Topics Concern   Not on file  Social History Narrative   Patient is married Jacob [name]) and lives at home with his wife and one child.   Patient has three children.   Patient is retired.   Patient has a high school education.   Patient is ambi-dextrous.   Patient drinks two cups of coffee about three  times a week.    Social Determinants of Health   Financial Resource Strain: Not on file  Food Insecurity: Not on file  Transportation Needs: Not on file  Physical Activity: Not on file  Stress: Not on file  Social Connections: Not on file   Allergies  Allergen Reactions   Lisinopril Cough    Medications  (Not in a hospital admission)    Vitals   Vitals:   02/16/21 1700 02/16/21 1715 02/16/21 1730 02/16/21 1745  BP: (!) 145/93 (!) 135/91 123/87 (!) 138/92  Pulse:  77    Resp: 15 15    Temp:      TempSrc:      SpO2:  100%    Weight:      Height:         Body mass index is 31.45 kg/m.  Physical Exam   General: Laying comfortably in bed; in no acute distress.  HENT: Normal oropharynx and mucosa. Normal external appearance of ears and nose.  Neck: Supple, no pain or tenderness  CV: No JVD. No peripheral edema.  Pulmonary: Symmetric Chest rise. Normal respiratory effort.  Abdomen: Soft to touch, non-tender.  Ext: No cyanosis, edema, or deformity  Skin: No rash. Normal palpation of skin.   Musculoskeletal: Normal digits and nails by inspection. No clubbing.   Neurologic Examination  Mental status/Cognition: Bradyphrenic, oriented to self, place, month and year, poor attention, loses train of thought.  Speech/language: Fluent, comprehension intact, object naming intact, repetition intact. Cranial nerves:   CN II Pupils equal and reactive to light, no VF deficits    CN III,IV,VI EOM intact, no gaze preference or deviation, no nystagmus    CN V normal sensation in V1, V2, and V3  segments bilaterally    CN VII no asymmetry, no nasolabial fold flattening    CN VIII normal hearing to speech    CN IX & X normal palatal elevation, no uvular deviation    CN XI 5/5 head turn and 5/5 shoulder shrug bilaterally    CN XII midline tongue protrusion    Motor:  Muscle bulk: normal, tone normal, pronator drift none. Has asterix/negative myoclonus in BL upper extremities with left side significantly worse than right. Mvmt Root Nerve  Muscle Right Left Comments  SA C5/6 Ax Deltoid 5 5   EF C5/6 Mc Biceps 5 5   EE C6/7/8 Rad Triceps 5 5   WF C6/7 Med FCR     WE C7/8 PIN ECU     F Ab C8/T1 U ADM/FDI 5 5   HF L1/2/3 Fem Illopsoas 5 5   KE L2/3/4 Fem Quad 5 5   DF L4/5 D Peron Tib Ant 5 5   PF S1/2 Tibial Grc/Sol 5 5    Reflexes:  Right Left Comments  Pectoralis      Biceps (C5/6) 2 2   Brachioradialis (C5/6) 2 2    Triceps (C6/7) 2 2    Patellar (L3/4) 2 2    Achilles (S1)      Hoffman      Plantar     Jaw jerk    Sensation:  Light touch Intact throughout   Pin prick    Temperature    Vibration   Proprioception    Coordination/Complex Motor:  - Finger to Nose intacT BL - Heel to shin intact BL - Rapid alternating movement are normal - Gait: Deferred for patient safety given myoclonus and earlier reported lightheaded.  Labs   CBC:  Recent Labs  Lab 02/16/21 1403 02/16/21 1428  WBC 5.7  --   NEUTROABS 3.2  --   HGB 9.4* 9.5*  HCT 28.6* 28.0*  MCV 101.1*  --   PLT 162  --     Basic Metabolic Panel:  Lab Results  Component Value Date   NA 140 02/16/2021   K 4.1 02/16/2021   CO2 22 02/16/2021   GLUCOSE 84 02/16/2021   BUN 83 (H) 02/16/2021   CREATININE 5.90 (H) 02/16/2021   CALCIUM 9.5 02/16/2021   GFRNONAA 10 (L) 02/16/2021   GFRAA 25 (L) 06/21/2019   Lipid Panel:  Lab Results  Component Value Date   LDLCALC 34 02/16/2021   HgbA1c:  Lab Results  Component Value Date   HGBA1C 5.4 02/16/2021   Urine Drug Screen:     Component  Value Date/Time   LABOPIA NONE DETECTED 05/11/2020 2045   COCAINSCRNUR NONE DETECTED 05/11/2020 2045   LABBENZ NONE DETECTED 05/11/2020 2045   AMPHETMU NONE DETECTED 05/11/2020 2045   THCU NONE DETECTED 05/11/2020 2045   LABBARB NONE DETECTED 05/11/2020 2045    Alcohol Level     Component Value Date/Time   ETH <10 02/16/2021 1403    CT Head without contrast(Personally reviewed): CTH was negative for a large hypodensity concerning for a large territory infarct or hyperdensity concerning for an ICH  MR Angio head without contrast and Carotid Duplex BL(Personally reviewed): No LVO  MRI Brain(Personally reviewed): No acute intracranial abnormalities   Impression   Marcellino Witherington is a 76 y.o. male who has had progressive confusion and asterixis/negative myoclonus over the last couple of weeks and had episode of acute worsening of his confusion along with lightheadedness and was brought in to the ED. Exam with bradyphrenia, poor attention and negative myoclonus/asterixis.  As for the difficulty with LUE, he attributes that to increased shaking of LUE and exam demonstrates that the myoclonus is worse in LUE than his RUE.  This is unlikely to be stroke. I suspect that this is all likely metabolic from uremic encephalopathy. He has had progressive worsening of kidney function and reports that his kidney doctors have been discussing potential dialysis.  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  Recommendations  - Encephalopathy workup with serum Ammonia, TSH, B12, folate. - Would recommend replacing B12 if below 500. 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 - We will signoff. Please feel free to contact us with any questions or concerns. ________________________________________________________  Plan discussed with patient. Patient's wife and son over the phone with patient's  permission and with the IM resident team over the phone.  Thank you for the opportunity to take part in the care of this patient. If you have any further questions, please contact the neurology consultation attending.  Signed,  [place] Pager Number [phone] _ _ _   _ __   _ __ _ _  __ __   _ __   __ _

## 2021-02-16 NOTE — Progress Notes (Signed)
Called regarding bladder scan showing retention >700 cc. Put in order for foley cath placement due to significant urine retention. Went ot go evaluate patient. He is having signs of increased confusion, tremor and hyperreflexia on exam. He has an AKI on CKD stage 4 at baseline. Patient will need nephrology consult due to concern for uremic encephalopathy. Will discuss with on call nephrologist.   Lawerance Cruel, D.O.  Internal Medicine Resident, PGY-3 [facility]  Pager: [phone] 10:54 PM, 02/16/2021

## 2021-02-16 NOTE — ED Notes (Signed)
Pt in MRI, pt to go from MRI to floor [id number]. MRI and 3W notified. All belongings taken to inpt room.

## 2021-02-16 NOTE — ED Notes (Signed)
Pt transported to MRI 

## 2021-02-16 NOTE — ED Notes (Signed)
Admitting MD team finished at [facility].

## 2021-02-16 NOTE — ED Notes (Signed)
Magnesium added, lab notified.

## 2021-02-16 NOTE — ED Provider Notes (Signed)
[facility] EMERGENCY DEPARTMENT Provider Note   CSN: [id number] Arrival date & time: 02/16/21  1337     History  Chief Complaint  Patient presents with   Dysphagia    Jacob [name] is a 76 y.o. male history includes CKD, hypertension, obesity, stomach cancer, gout.  Patient presented via EMS today for difficulty speaking and difficulty using his left arm.  Per triage/EMS symptoms ongoing for 3 weeks.  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  Patient reports that when he woke up this morning at 6 AM he was feeling well he had no difficulty speaking or moving and went back to sleep.  Patient reports that he then woke up around noon and was having trouble speaking, this was new for him he feels that it is continuing, is associated with a heaviness of his left arm and difficulty moving it which is what prompted EMS to be called.  Patient reports that he has had some difficulty speaking intermittently over the past 3 weeks but normally resolve spontaneously.  I spoke to patient's wife over the phone who provided supplemental information and agreed with the story above.  Denies recent illness, fever, chills, fall, injury, chest pain, shortness of breath, numbness, hematuria/dysuria, vomiting, diarrhea or any additional concerns. HPI     Home Medications Prior to Admission medications   Medication Sig Start Date End Date Taking? Authorizing Provider  acetaminophen (TYLENOL) 500 MG tablet Take 1,000 mg by mouth daily as needed for moderate pain or headache.    [provider]  allopurinol (ZYLOPRIM) 300 MG tablet Take 150 mg by mouth daily. 04/15/19   [provider]  aspirin EC 81 MG tablet Take 1 tablet (81 mg total) by mouth daily. Swallow whole. 01/10/21   Tobb, Kardie, DO  buPROPion (WELLBUTRIN XL) 300 MG 24 hr tablet Take 300 mg by mouth daily.  05/10/15   [provider]  busPIRone (BUSPAR) 10 MG tablet Take 10 mg by mouth.     [provider]  calcium carbonate (TUMS - DOSED IN MG ELEMENTAL CALCIUM) 500 MG chewable tablet Chew 3 tablets by mouth 2 (two) times daily as needed for indigestion or heartburn.    [provider]  cetirizine (ZYRTEC) 10 MG tablet Take 10 mg by mouth daily.    [provider]  diclofenac Sodium (VOLTAREN) 1 % GEL Apply 2 g topically 4 (four) times daily. 11/06/20   White, Leitha Schuller, NP  donepezil (ARICEPT) 5 MG tablet Take 1 tablet (5 mg total) by mouth at bedtime. 08/13/20   Dohmeier, Asencion Partridge, MD  furosemide (LASIX) 20 MG tablet Take 20 mg by mouth daily.     [provider]  gabapentin (NEURONTIN) 400 MG capsule Take 400 mg by mouth.    [provider]  hydrALAZINE (APRESOLINE) 25 MG tablet Take 25 mg by mouth 3 (three) times daily.  Patient not taking: Reported on 02/07/2021 08/05/18   [provider]  hydrALAZINE (APRESOLINE) 50 MG tablet Take 50 mg by mouth 3 (three) times daily. 02/05/21   [provider]  levothyroxine (SYNTHROID) 112 MCG tablet Take 112 mcg by mouth daily before breakfast. Patient not taking: Reported on 02/07/2021    [provider]  lidocaine (XYLOCAINE) 5 % ointment Apply 1 application topically as needed. 11/06/20   White, Adrienne R, NP  LOKELMA 5 g packet TAKE 5 GRAMS BY MOUTH TWO TIMES A WEEK Patient taking differently: Take 5 g by mouth 2 (two) times  a week. 02/13/20   Fayrene Helper, MD  melatonin 3 MG TABS tablet Take 9 mg by mouth at bedtime.    [provider]  Nebivolol HCl 20 MG TABS Take 20 mg by mouth daily.    [provider]  pantoprazole (PROTONIX) 40 MG tablet Take 1 tablet (40 mg total) by mouth daily before supper. 09/18/18   Kayleen Memos, DO  polycarbophil (FIBERCON) 625 MG tablet Take 1 tablet (625 mg total) by mouth daily. Patient not taking: Reported on 11/23/2020 04/21/16   [name], MD  polyethylene glycol (MIRALAX / GLYCOLAX) 17 g packet Take 17 g by  mouth 2 (two) times daily. 09/18/18   Kayleen Memos, DO  prochlorperazine (COMPAZINE) 10 MG tablet TAKE ONE TABLET BY MOUTH EVERY 6 HOURS AS NEEDED FOR NAUSSEA AND VOMITING Patient not taking: Reported on 11/23/2020 12/13/19   Wyatt Portela, MD  rosuvastatin (CRESTOR) 10 MG tablet Take 1 tablet (10 mg total) by mouth daily. 01/10/21 04/10/21  Tobb, Kardie, DO  rosuvastatin (CRESTOR) 5 MG tablet Take 5 mg by mouth daily. Patient states he takes 15 MG daily 02/05/21   [provider]  sodium bicarbonate 650 MG tablet Take 650 mg by mouth 2 (two) times daily. Patient not taking: Reported on 11/23/2020    [provider]  sodium zirconium cyclosilicate (LOKELMA) 10 g PACK packet Take 10 g by mouth.    [provider]  tamsulosin (FLOMAX) 0.4 MG CAPS capsule Take 1 capsule by mouth every day 10/10/20   Cleon Gustin, MD  venlafaxine XR (EFFEXOR-XR) 150 MG 24 hr capsule Take 150 mg by mouth every morning. For depression/anxiety 02/29/20   [provider]  verapamil (CALAN) 80 MG tablet Take 80 mg by mouth 3 (three) times daily.     [provider]      Allergies    Lisinopril    Review of Systems   Review of Systems Ten systems are reviewed and are negative for acute change except as noted in the HPI  Physical Exam Updated Vital Signs BP (!) 167/95    Pulse 77    Temp (!) 97.2 F (36.2 C) (Oral)    Resp 13    Ht 6\' 2"  (1.88 m)    Wt 111.1 kg    SpO2 100%    BMI 31.45 kg/m  Physical Exam Constitutional:      General: He is not in acute distress.    Appearance: Normal appearance. He is well-developed. He is not ill-appearing or diaphoretic.  HENT:     Head: Normocephalic and atraumatic.  Eyes:     General: Vision grossly intact. Gaze aligned appropriately.     Pupils: Pupils are equal, round, and reactive to light.  Neck:     Trachea: Trachea and phonation normal.  Pulmonary:     Effort: Pulmonary effort is normal. No respiratory distress.   Abdominal:     General: There is no distension.     Palpations: Abdomen is soft.     Tenderness: There is no abdominal tenderness. There is no guarding or rebound.  Musculoskeletal:        General: Normal range of motion.     Cervical back: Normal range of motion.  Skin:    General: Skin is warm and dry.  Neurological:     Mental Status: He is alert.     GCS: GCS eye subscore is 4. GCS verbal subscore is 5. GCS motor subscore is  6.     Comments: Speech is slightly slurred but patient is edentulous.  Patient goal oriented, follows commands Major Cranial nerves without deficit, no facial droop. Slightly diminished but equal strength in upper and lower extremities bilaterally including dorsiflexion and plantar flexion, strong and equal grip strength Sensation normal to light and sharp touch Moves extremities without ataxia, coordination intact Normal finger to nose and rapid alternating movements No pronator drift   Psychiatric:        Behavior: Behavior normal.    ED Results / Procedures / Treatments   Labs (all labs ordered are listed, but only abnormal results are displayed) Labs Reviewed  CBC - Abnormal; Notable for the following components:      Result Value   RBC 2.83 (*)    Hemoglobin 9.4 (*)    HCT 28.6 (*)    MCV 101.1 (*)    All other components within normal limits  COMPREHENSIVE METABOLIC PANEL - Abnormal; Notable for the following components:   BUN 78 (*)    Creatinine, Ser 5.58 (*)    Albumin 3.4 (*)    GFR, Estimated 10 (*)    All other components within normal limits  I-STAT CHEM 8, ED - Abnormal; Notable for the following components:   BUN 83 (*)    Creatinine, Ser 5.90 (*)    Hemoglobin 9.5 (*)    HCT 28.0 (*)    All other components within normal limits  RESP PANEL BY RT-PCR (FLU A&B, COVID) ARPGX2  ETHANOL  PROTIME-INR  APTT  DIFFERENTIAL  RAPID URINE DRUG SCREEN, HOSP PERFORMED  URINALYSIS, ROUTINE W REFLEX MICROSCOPIC  MAGNESIUM     EKG EKG Interpretation  Date/Time:  Saturday February 16 2021 13:39:24 EST Ventricular Rate:  77 PR Interval:  59 QRS Duration: 99 QT Interval:  399 QTC Calculation: 452 R Axis:   31 Text Interpretation: Sinus rhythm Short PR interval Probable left atrial enlargement Confirmed by Regan Lemming ([id number]) on 02/16/2021 2:10:08 PM  Radiology CT HEAD WO CONTRAST  Result Date: 02/16/2021 CLINICAL DATA:  Acute neuro deficit, stroke suspected. EXAM: CT HEAD WITHOUT CONTRAST TECHNIQUE: Contiguous axial images were obtained from the base of the skull through the vertex without intravenous contrast. RADIATION DOSE REDUCTION: This exam was performed according to the departmental dose-optimization program which includes automated exposure control, adjustment of the mA and/or kV according to patient size and/or use of iterative reconstruction technique. COMPARISON:  Head CT dated 05/11/2020. FINDINGS: Brain: Generalized age related parenchymal volume loss with commensurate dilatation of the ventricles and sulci. Ventricles are stable in size and configuration. Mild chronic small vessel ischemic changes within the bilateral periventricular white matter regions. No mass, hemorrhage, edema or other evidence of acute parenchymal abnormality. No extra-axial hemorrhage. Vascular: Chronic calcified atherosclerotic changes of the large vessels at the skull base. No unexpected hyperdense vessel. Skull: Normal. Negative for fracture or focal lesion. Sinuses/Orbits: No acute finding. Other: None. IMPRESSION: 1. No acute findings. No intracranial mass, hemorrhage or edema. 2. Chronic small vessel ischemic changes in the white matter. Electronically Signed   By: Franki Cabot M.D.   On: 02/16/2021 14:38    Procedures Procedures    Medications Ordered in ED Medications  sodium chloride 0.9 % bolus 1,000 mL (1,000 mLs Intravenous New Bag/Given 02/16/21 1515)    ED Course/ Medical Decision Making/ A&P Clinical Course as  of 02/16/21 1622  Sat Feb 16, 2021  [address], [place] [phone]  [BM]  [place] [phone])  [phone] [facility] [  BM]  1504 Dr. Theda Sers [BM]  1508 MRI tech portacath [BM]  58 Dr Jaci Standard [BM]    Clinical Course User Index [BM] Deliah Boston, PA-C                           Medical Decision Making 76 year old male presented for slurred speech and difficulty moving his left side, reported last known well around 6 AM today however symptoms appear intermittent over the last 3 weeks.  On my exam patient does have some slurred speech and is not wearing his dentures.  Otherwise neurologic examination is nonfocal, he does have generalized weakness in all 4 extremities but appears symmetric.  He denies any recent illnesses or trauma, denies any pain at this time.  Supplemental history was obtained from both patient's son Vicente Males and his wife [name] over the phone.  Will obtain CVA work-up however patient does not meet code stroke criteria/LVO criteria at this time.  I discussed the plan with Dr. Armandina Gemma who agrees, neuro paged. ------- CT imaging did not show acute finding but did show chronic ischemic changes.  Labs were significant for AKI with creatinine of 5.58/BUN 78, patient's baseline creatinine around 3.  Anemia of 9.4 improved from 1 month ago.  No leukocytosis or thrombocytopenia.  Ethanol negative.  INR within normal limits.  Urinalysis pending. ---  I spoke with neurologist Dr. Theda Sers who advises getting MRI of the brain as well as MR a of the head and neck without contrast given AKI.  Discussed case with Dr. Armandina Gemma, will admit to medicine for AKI and further CVA work-up.  I called MRI department and confirmed the patient's Port-A-Cath is clear for MRI. --- Patient reassessed he is resting comfortably in bed no acute distress, his brother is at bedside, patient's brother reports that he only notices some slight slurred speech but feels that is unchanged from  the past 3 weeks.  Patient remains pain-free, no acute distress, vital signs are stable on room air.  Patient and his brother are both agreeable to hospital admission at this point.   Amount and/or Complexity of Data Reviewed Independent Historian: spouse External Data Reviewed: labs and notes. Labs: ordered. Radiology: ordered.  Risk Decision regarding hospitalization.   I discussed the case with internal medicine residency service, Dr. Jaci Standard, patient was accepted for admission.  Patient reassessed after call for admission he remained stable and in no acute distress, pain-free and reports some slurred speech as only symptom currently.  Note: Portions of this report may have been transcribed using voice recognition software. Every effort was made to ensure accuracy; however, inadvertent computerized transcription errors may still be present.         Final Clinical Impression(s) / ED Diagnoses Final diagnoses:  Dysarthria  AKI (acute kidney injury) [facility])    Rx / DC Orders ED Discharge Orders     None         Gari Crown 02/16/21 1622    Regan Lemming, MD 02/16/21 1657

## 2021-02-16 NOTE — ED Triage Notes (Signed)
Pt arrived via [facility] from home for dysphagiax3 wks. Per EMS, pt's wife stated pt woke up at 0800 with dizziness and worse dysphagia. Per EMS, pt has intermittent dysphagia and expressive aphasia. Pt is A&Ox4.

## 2021-02-16 NOTE — ED Notes (Signed)
Swallow screen passed at 1549

## 2021-02-16 NOTE — H&P (Addendum)
Date: 02/16/2021               Patient Name:  Jacob [name] MRN: [id number]  DOB: January 27, 1945 Age / Sex: 76 y.o., male   PCP: [name], MD         Medical Service: Internal Medicine Teaching Service         Attending Physician: Dr. Saverio Danker    First Contact: Lajean Manes, MD Pager: MP [phone]  Second Contact: Rick Duff, MD Pager: [phone]       After Hours (After 5p/  First Contact Pager: [phone]  weekends / holidays): Second Contact Pager: [phone]    Chief Complaint: left arm weakness and trouble speaking   History of Present Illness:  Jacob [name] is a 76 y.o. male with a pertinent PMH HTN, CKD stage 4, obesity, history of gastric cancer, and gout presenting to the ED for dysarthria and difficulty using left arm since 6 am this morning.   He reports that difficulty with speech and left arm weakness started abruptly starting at around 11 am this morning. He states that these symptoms were not present at 6 am that morning. His son is at bedside and confirms this. He complaints that his left arm feels "heavy". Pt also complaints of confusion and memory loss, though he has possible dx of dementia. Does report that he had some falls in the past due to lightheadedness, but reports no recent falls. He denies vision changes, lightheadedness, chest pain and SHOB. Denies fevers, chills, nausea, vomiting. Reports constipation for some time, DOE ongoing for 1 year, and occasional palpitations. He reports that he is taking all of his medications as prescribed. Reports that he has been eating and drinking fine.  Initial ED workup with negative head CT. Neurology was consulted, recommending getting MRI of the brain as well as MR a of the head and neck without contrast given poor kidney function. Initial labs with Cr 5.9 (baseline near 4). He received 1L of IVF in the ED. Pt reports that he follows with a nephrologist, and that he has been told that he may need to start HD  soon. He continues to produce urine, up to 3x a day.   Meds:  No current facility-administered medications on file prior to encounter.   Current Outpatient Medications on File Prior to Encounter  Medication Sig Dispense Refill   acetaminophen (TYLENOL) 500 MG tablet Take 1,000 mg by mouth daily as needed for moderate pain or headache.     buPROPion (WELLBUTRIN XL) 300 MG 24 hr tablet Take 300 mg by mouth daily.      furosemide (LASIX) 20 MG tablet Take 20 mg by mouth daily.      hydrALAZINE (APRESOLINE) 50 MG tablet Take 50 mg by mouth 3 (three) times daily.     Nebivolol HCl 20 MG TABS Take 20 mg by mouth daily.     rosuvastatin (CRESTOR) 5 MG tablet Take 5 mg by mouth daily. Patient states he takes 15 MG daily     sodium bicarbonate 650 MG tablet Take 650 mg by mouth 2 (two) times daily.     sodium zirconium cyclosilicate (LOKELMA) 10 g PACK packet Take 10 g by mouth daily.     tamsulosin (FLOMAX) 0.4 MG CAPS capsule Take 1 capsule by mouth every day 30 capsule 11   verapamil (CALAN) 80 MG tablet Take 80 mg by mouth 3 (three) times daily.      allopurinol (ZYLOPRIM) 300 MG tablet Take  150 mg by mouth daily. (Patient not taking: Reported on 02/16/2021)     aspirin EC 81 MG tablet Take 1 tablet (81 mg total) by mouth daily. Swallow whole. (Patient not taking: Reported on 02/16/2021) 90 tablet 3   calcium carbonate (TUMS - DOSED IN MG ELEMENTAL CALCIUM) 500 MG chewable tablet Chew 3 tablets by mouth 2 (two) times daily as needed for indigestion or heartburn. (Patient not taking: Reported on 02/16/2021)     diclofenac Sodium (VOLTAREN) 1 % GEL Apply 2 g topically 4 (four) times daily. (Patient not taking: Reported on 02/16/2021) 100 g 1   donepezil (ARICEPT) 5 MG tablet Take 1 tablet (5 mg total) by mouth at bedtime. (Patient not taking: Reported on 02/16/2021) 30 tablet 6   hydrALAZINE (APRESOLINE) 25 MG tablet Take 25 mg by mouth 3 (three) times daily.  (Patient not taking: Reported on 02/07/2021)      lidocaine (XYLOCAINE) 5 % ointment Apply 1 application topically as needed. (Patient not taking: Reported on 02/16/2021) 35.44 g 0   melatonin 3 MG TABS tablet Take 9 mg by mouth at bedtime. (Patient not taking: Reported on 02/16/2021)     pantoprazole (PROTONIX) 40 MG tablet Take 1 tablet (40 mg total) by mouth daily before supper. (Patient not taking: Reported on 02/16/2021) 30 tablet 0   polyethylene glycol (MIRALAX / GLYCOLAX) 17 g packet Take 17 g by mouth 2 (two) times daily. (Patient not taking: Reported on 02/16/2021) 14 each 0   prochlorperazine (COMPAZINE) 10 MG tablet TAKE ONE TABLET BY MOUTH EVERY 6 HOURS AS NEEDED FOR NAUSSEA AND VOMITING (Patient not taking: Reported on 11/23/2020) 30 tablet 1   rosuvastatin (CRESTOR) 10 MG tablet Take 1 tablet (10 mg total) by mouth daily. (Patient not taking: Reported on 02/16/2021) 90 tablet 3   venlafaxine XR (EFFEXOR-XR) 150 MG 24 hr capsule Take 150 mg by mouth every morning. For depression/anxiety (Patient not taking: Reported on 02/16/2021)     Allergies: Allergies as of 02/16/2021 - Review Complete 02/16/2021  Allergen Reaction Noted   Lisinopril Cough 06/29/2008   Past Medical History:  Diagnosis Date   Anemia    hx iron deficiency   Anxiety    Arthritis    gout   Back pain    BPH with obstruction/lower urinary tract symptoms    Chronic kidney disease    "they said I do"   Constipation    Depression    ED (erectile dysfunction)    Epigastric pain    GERD (gastroesophageal reflux disease)    Heartburn    History of blood transfusion    History of radiation therapy 11/03/11-12/29/11   prostate   Hypercholesterolemia    Hypertension    Night sweats    Oxygen deficiency    2x per week at night for sleep   Post-operative nausea and vomiting 04/09/2016   Prostate cancer ([place]) 08/14/11   Adenocarcinoma,gleason:3+3=6,& 3+4=7,PSA=5.66   PUD (peptic ulcer disease)    Sleep apnea    does not use Cpap but does use O2 @ 2l    Stomach cancer  ([place])    Ulcer    peptic ulcer hx   Family History:  Family History  Problem Relation Age of Onset   Cancer Mother        NOS   Alcohol abuse Father    Alcohol abuse Brother 47   Cancer Paternal Aunt        NOS   Colon cancer Neg Hx  Social History:   Brushes teeth and showers by himself Lives with wife and one of his son at home  Stopped smoking 20 years ago. Drinks a beer a month.  IADLs/ADLs- can person independently at baseline   Review of Systems: A complete ROS was negative except as per HPI.    Physical Exam: Blood pressure (!) 138/92, pulse 77, temperature (!) 97.2 F (36.2 C), temperature source Oral, resp. rate 15, height 6\' 2"  (1.88 m), weight 111.1 kg, SpO2 100 %. Constitutional: alert, well-appearing, in NAD  Cardiovascular: RRR, no m/r/g, non-edematous bilateral LE Pulmonary/Chest: normal work of breathing on room air, LCTAB Abdominal: soft, non-tender to palpation, non-distended MSK: normal bulk and tone Skin: warm and dry Neurological: A&O x 3, follows commands  Pupils equal and reactive. Slightly slurred speech with word finding difficulty, no facial drop present and tongue midline, equal grip strength. Strength 4/5 in right upper extremity and 5/5 in all other extremities. Sensation intact.     EKG: SR  CTH: No acute findings; chronic small vessel ischemic changes in the white matter  Assessment & Plan by Problem: Principal Problem:   Stroke [facility])   ?Acute ischemic infarction Left arm weakness  Dysarthria  Hx of HTN  Presenting with acute onset dysarthria and left arm weakness. Focal deficits on exam with drifting of the left arm and weakness in the right arm. CTH not concerning for acute ischemia. Neurology consulted, recommending further imaging for stroke workup. Risk factor includes HTN and CAD. Loaded with ASA 325mg  and Plavix 75mg .  - Neurology following, appreciate assistance  - MRI brain without contrast ordered  - MRA head and neck w/o  contrast ordered  - Aspirin 81mg  daily - Clopidogrel 75mg  daily for 3 weeks - UDS pending  - A1c pending  - Lipid panel pending  - PT/OT/SLP      AKI on CKD stage 4 Cr 5.58 -> 5.9 this admission, baseline near 3.5. Potassium 4.2. He follows with a nephrologist and states that he has been told that he may need to start HD soon. S/p 1L of IVF in the ED. Appears euvolemic on exam, will given him another 1/2L bolus and re-assess in the AM.  - Bladder scan - Renal US  - Urine studies  - Trend BMP - Avoid nephrotoxins  - Holding Lokelma  Abnormal stress test  Nuclear stress test 12/2020 consistent with ischemia. TTE 12/2020 with LVEF 50-55%.  Plan for left heart catheterization deferred as his nephrologist does not recommend given poor renal function, and given symptom improvement after starting Crestor 10 mg daily, Aspirin 81 mg and Imdur 30 mg daily. - Continue medical management as above   Gout - Continue allopurinol   Hx of gastric cancer  S/p partial gastrectomy, undergoing active surveillance with oncology. Has a 5 mm upper lobe pulmonary nodule that is thought to be unlikely metastatic disease, but plan for repeat imaging in the OP setting with Dr Alen Blew.  -Continue outpatient follow up with oncology   Hx of prostate cancer  He is status post radiation therapy for definitive treatment for his prostate cancer (2013)  BPH - Continue Flomax   Best Practice: Diet: NPO IVF: None,None Code: Full   Lajean Manes, MD  Internal Medicine Resident, PGY-1 Pager: [phone] 6:20 PM, 02/16/2021

## 2021-02-16 NOTE — Progress Notes (Addendum)
° °  Subjective: No acute overnight events.   Patient was seen at bedside during rounds today. Pt reports feeling much better compared to when he came in. He reports improvement in speech and left arm trembling.  Pt is updated on the plan for today, and all questions and concerns are addressed.   Objective:  Vital signs in last 24 hours: Vitals:   02/16/21 2024 02/17/21 0010 02/17/21 0400 02/17/21 0723  BP: (!) 150/93 (!) 143/86 (!) 143/91 (!) 131/94  Pulse: 75 77 74 75  Resp: 13   20  Temp: 97.9 F (36.6 C) 98.1 F (36.7 C) 98 F (36.7 C) 98.1 F (36.7 C)  TempSrc: Oral Oral Oral Oral  SpO2: 100%  100% 98%  Weight:      Height:       Constitutional: alert, well-appearing, in NAD  Eyes : anicteric sclera Cardiovascular: RRR, no m/r/g, non-edematous bilateral LE Pulmonary/Chest: normal work of breathing on room air, LCTAB Abdominal: soft, non-tender to palpation, non-distended Extremities: no asterixis, mild tremors present L>R Neurological: A&O x 3, follows commands. Normal speech, no gross focal deficits.   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 MRI brain and MRA head and neck (per neurology's request) negative for acute ischemia. A1c, LDL and UDS reassuring. Uremic encephalopathy suspected in s/o AKI on CKD4 and nephrology consulted. Difficult to assess Cr baseline d/t fluctuations (last Cr 5 in nephro office visit), but bump in Cr this admission possibly d/t dehydration and also urinary retention, given found to be retaining 700cc overnight requiring foley cath, and improvement in lab values s/p total 1.5L IVF. Not uremic on nephology evaluation.  - Neurology signed off  - No urgent dialysis indicated by nephrology; continue outpatient f/u to initiate  nonurgent dialysis    - Referral for VVS in place  - Encourage oral intake - Foley catheter  - Trend BMP - Strict I/Os and daily weight - Avoid nephrotoxins  - PT/OT      HTN   Unsure which medication he is taking. Daughter was able to provide some insight; she believes he is taking Verapamil 90 tid and Hydralazine 90 tid, but will be able to provide more info tmrw.  - Given near-normal BP, will start Hydral 50 tid at this time    Gout - Continue allopurinol    Hx of gastric cancer  S/p partial gastrectomy, undergoing active surveillance with oncology. Has a 5 mm upper lobe pulmonary nodule that is thought to be unlikely metastatic disease, but plan for repeat imaging in the OP setting with Dr Alen Blew.  -Continue outpatient follow up with oncology    Hx of prostate cancer  BPH He is status post radiation therapy for definitive treatment for his prostate cancer (2013) - Continue Flomax    Best Practice: Diet: NPO IVF: None,None Code: Full   Lajean Manes, MD  Internal Medicine Resident, PGY-1 Pager: [phone] After 5pm on weekdays and 1pm on weekends: On Call pager [phone]

## 2021-02-16 NOTE — Hospital Course (Addendum)
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  Patient noted onset of the symptoms earlier in the morning on day of presentation.  Patient also noted he felt more confused than normal.  Given initial presentation there was concern for stroke and neurology was consulted.  CT head and MRI negative for any acute process.  No significant intracranial stenosis, bilateral ICAs and vertebral arteries without significant stenosis.  Lab work notable for stable macrocytic anemia, increased creatinine to 5.58 and BUN 78.  Baseline creatinine around 2.7-3, BUN 40s.  Given negative stroke evaluation and significant decline in kidney function with uremia, concern for uremic encephalopathy as etiology of patient's presentation.  Neurology did note asterixis on initial examination.  Patient does follow with nephrology and notes that he has been told he may need to start dialysis soon.  Does continue to produce urine.  Treated with 1.5 L IV fluids overnight.  Noted to be retaining urine overnight with placement of Foley catheter.  1200 cc out.  On evaluation this morning Mr. Whiteley reports his speech is back to normal and does not sound dysarthric.  No further left arm weakness or shaking per patient report, none seen on exam.  No asterixis noted.  No evidence of volume overload.  Creatinine 4.97 BUN 72 this morning.  Electrolytes within normal limits.  Hemoglobin stable. Although uremic encephalopathy suspected in s/o AKI on CKD4, not determined to be uremic on nephrology eval. Difficult to assess Cr baseline d/t fluctuations (last Cr 5 in nephro office visit), but bump in Cr this admission possibly d/t dehydration and also urinary retention, given found to be retaining 700cc overnight requiring foley cath, and  improvement in lab values s/p total 1.5L IVF. No urgent dialysis indicated by nephrology; continue outpatient f/u to initiate nonurgent dialysis    - No urgent dialysis indicated by nephrology, but VVS was consulted to place [place] to expedite the process so that he can start HD soon. He is to follow up with nephrology as an outpatient to continue with the process.  - IVF initially  - Foley catheter during admission for retention, and removed after a voiding trial - Trend BMP - Strict I/Os and daily weight - Avoid nephrotoxins  - PT/OT       HTN   Unsure which medication he is taking. Daughter was able to provide some insight; she believes he is taking Verapamil 90 tid and Hydralazine 90 tid, but will be able to provide more info tmrw.  - Given near-normal BP, will start Hydral 50 tid at this time  Hx of gastric cancer  S/p partial gastrectomy, undergoing active surveillance with oncology. Has a 5 mm upper lobe pulmonary nodule that is thought to be unlikely metastatic disease, but plan for repeat imaging in the OP setting with Dr Alen Blew.  -Continue outpatient follow up with oncology    Hx of prostate cancer  BPH He is status post radiation therapy for definitive treatment for his prostate cancer (2013) - Continue Flomax

## 2021-02-17 DIAGNOSIS — N184 Chronic kidney disease, stage 4 (severe): Secondary | ICD-10-CM | POA: Diagnosis not present

## 2021-02-17 DIAGNOSIS — Z8500 Personal history of malignant neoplasm of unspecified digestive organ: Secondary | ICD-10-CM

## 2021-02-17 DIAGNOSIS — R338 Other retention of urine: Secondary | ICD-10-CM | POA: Diagnosis not present

## 2021-02-17 DIAGNOSIS — M109 Gout, unspecified: Secondary | ICD-10-CM

## 2021-02-17 DIAGNOSIS — Z8546 Personal history of malignant neoplasm of prostate: Secondary | ICD-10-CM

## 2021-02-17 DIAGNOSIS — N401 Enlarged prostate with lower urinary tract symptoms: Secondary | ICD-10-CM | POA: Diagnosis not present

## 2021-02-17 DIAGNOSIS — N179 Acute kidney failure, unspecified: Secondary | ICD-10-CM

## 2021-02-17 LAB — CBC
HCT: 27.6 % — ABNORMAL LOW (ref 39.0–52.0)
Hemoglobin: 9 g/dL — ABNORMAL LOW (ref 13.0–17.0)
MCH: 32.5 pg (ref 26.0–34.0)
MCHC: 32.6 g/dL (ref 30.0–36.0)
MCV: 99.6 fL (ref 80.0–100.0)
Platelets: 152 10*3/uL (ref 150–400)
RBC: 2.77 MIL/uL — ABNORMAL LOW (ref 4.22–5.81)
RDW: 13.2 % (ref 11.5–15.5)
WBC: 5.6 10*3/uL (ref 4.0–10.5)
nRBC: 0 % (ref 0.0–0.2)

## 2021-02-17 LAB — BASIC METABOLIC PANEL
Anion gap: 10 (ref 5–15)
BUN: 72 mg/dL — ABNORMAL HIGH (ref 8–23)
CO2: 23 mmol/L (ref 22–32)
Calcium: 9.3 mg/dL (ref 8.9–10.3)
Chloride: 106 mmol/L (ref 98–111)
Creatinine, Ser: 4.97 mg/dL — ABNORMAL HIGH (ref 0.61–1.24)
GFR, Estimated: 11 mL/min — ABNORMAL LOW (ref >60)
Glucose, Bld: 103 mg/dL — ABNORMAL HIGH (ref 70–99)
Potassium: 4 mmol/L (ref 3.5–5.1)
Sodium: 139 mmol/L (ref 135–145)

## 2021-02-17 LAB — RAPID URINE DRUG SCREEN, HOSP PERFORMED
Amphetamines: NOT DETECTED
Barbiturates: NOT DETECTED
Benzodiazepines: NOT DETECTED
Cocaine: NOT DETECTED
Opiates: NOT DETECTED
Tetrahydrocannabinol: NOT DETECTED

## 2021-02-17 MED ORDER — CHLORHEXIDINE GLUCONATE CLOTH 2 % EX PADS
6.0000 | MEDICATED_PAD | Freq: Every day | CUTANEOUS | Status: DC
  Administered 2021-02-17: 6 via TOPICAL

## 2021-02-17 MED ORDER — VITAMIN B-12 100 MCG PO TABS
500.0000 ug | ORAL_TABLET | Freq: Every day | ORAL | Status: DC
  Administered 2021-02-17 – 2021-02-18 (×2): 500 ug via ORAL
  Filled 2021-02-17 (×2): qty 5

## 2021-02-17 MED ORDER — ALLOPURINOL 100 MG PO TABS: 150.0000 mg | ORAL_TABLET | Freq: Every day | ORAL | Status: DC

## 2021-02-17 MED ORDER — BUPROPION HCL ER (XL) 150 MG PO TB24
300.0000 mg | ORAL_TABLET | Freq: Every day | ORAL | Status: DC
  Administered 2021-02-17 – 2021-02-18 (×2): 300 mg via ORAL
  Filled 2021-02-17 (×2): qty 2

## 2021-02-17 MED ORDER — HYDRALAZINE HCL 50 MG PO TABS
50.0000 mg | ORAL_TABLET | Freq: Three times a day (TID) | ORAL | Status: DC
  Administered 2021-02-17 – 2021-02-19 (×6): 50 mg via ORAL
  Filled 2021-02-17 (×6): qty 1

## 2021-02-17 MED ORDER — BISACODYL 10 MG RE SUPP: 10.0000 mg | Freq: Every day | RECTAL | Status: DC | PRN

## 2021-02-17 MED ORDER — POLYETHYLENE GLYCOL 3350 17 G PO PACK
17.0000 g | PACK | Freq: Every day | ORAL | Status: DC
  Administered 2021-02-17 – 2021-02-18 (×2): 17 g via ORAL
  Filled 2021-02-17 (×2): qty 1

## 2021-02-17 NOTE — Evaluation (Signed)
Physical Therapy Evaluation Patient Details Name: Jacob [name] MRN: [id number] DOB: 1945/05/26 Today's Date: 02/17/2021  History of Present Illness  Tarrence Tay is a 76 y.o. male with a pertinent PMH HTN, CKD stage 4, obesity, history of gastric cancer, and gout presenting to the ED for dysarthria and difficulty using left arm since 6 am this morning 2/4. imaging didn't reveal any acute lesions. L UE weakness and difficulty speaking has resided. Pt was found to have poor kidney function.   Clinical Impression  Pt admitted with above. Pt no longer with confusion, difficulty talking, or L UE weakness. Pt functioning at min guard level. Pt with increased stability with RW during ambulation and denies being "winded" with RW. Pt encouraged to use RW at home as pt states "I don't walk much because I get short of breath. So I just sit a lot in the house." Spoke with son Vicente Males and he agreed to place emphasis on using RW at home. Pt with good home set up and support. Derek lives with pt, works at night but present during the day and reports brother and sister to check in on patient, provides food, and will take to MD appointments. Acute PT to cont to follow.       Recommendations for follow up therapy are one component of a multi-disciplinary discharge planning process, led by the attending physician.  Recommendations may be updated based on patient status, additional functional criteria and insurance authorization.  Follow Up Recommendations No PT follow up    Assistance Recommended at Discharge Intermittent Supervision/Assistance  Patient can return home with the following  Assistance with cooking/housework;Assist for transportation;Help with stairs or ramp for entrance    Equipment Recommendations Rollator (4 wheels) (if pt qualifies through insurance for rollator, pt would benefit from energy conservation purposes to improved amb tolerance)  Recommendations for Other Services        Functional Status Assessment Patient has had a recent decline in their functional status and demonstrates the ability to make significant improvements in function in a reasonable and predictable amount of time.     Precautions / Restrictions Precautions Precautions: Fall Restrictions Weight Bearing Restrictions: No      Mobility  Bed Mobility Overal bed mobility: Modified Independent             General bed mobility comments: increased time, no use of bed rail, minimal HOB elevation    Transfers Overall transfer level: Needs assistance Equipment used: Rolling walker (2 wheels) Transfers: Sit to/from Stand Sit to Stand: From elevated surface, Min guard           General transfer comment: min guard for safety, verbal cues for hand placement, no physical assist, incresaed time with wide base of support    Ambulation/Gait Ambulation/Gait assistance: Min guard Gait Distance (Feet): 100 Feet Assistive device: Rolling walker (2 wheels) Gait Pattern/deviations: Step-through pattern, Decreased stride length, Trunk flexed Gait velocity: dec Gait velocity interpretation: <1.31 ft/sec, indicative of household ambulator   General Gait Details: pt with decreased pace, verbal cues for walker safety, ie don't pick up walker when turning and how to back up safely to chair and staying inside the walker. pt reports "I can walk a lot more with the walker" no LOB noted  Stairs            Wheelchair Mobility    Modified Rankin (Stroke Patients Only)       Balance Overall balance assessment: Mild deficits observed, not formally tested (pt with improved  stability with RW during amb)                                           Pertinent Vitals/Pain Pain Assessment Pain Assessment: No/denies pain    Home Living Family/patient expects to be discharged to:: Private residence Living Arrangements: Spouse/significant other Available Help at Discharge:  Family;Available PRN/intermittently Type of Home: House Home Access: Stairs to enter Entrance Stairs-Rails: Right;Left;Can reach both Entrance Stairs-Number of Steps: 5   Home Layout: One level Home Equipment: Conservation officer, nature (2 wheels)      Prior Function Prior Level of Function : Needs assist             Mobility Comments: has a walker but reports he doesnt use it ADLs Comments: indep, increased time     Hand Dominance   Dominant Hand: Right    Extremity/Trunk Assessment   Upper Extremity Assessment Upper Extremity Assessment: Overall WFL for tasks assessed    Lower Extremity Assessment Lower Extremity Assessment: Overall WFL for tasks assessed    Cervical / Trunk Assessment Cervical / Trunk Assessment: Normal  Communication   Communication: No difficulties  Cognition Arousal/Alertness: Awake/alert Behavior During Therapy: WFL for tasks assessed/performed Overall Cognitive Status: Within Functional Limits for tasks assessed                                          General Comments General comments (skin integrity, edema, etc.): VSS, no SOB/DOE with amb    Exercises     Assessment/Plan    PT Assessment Patient needs continued PT services  PT Problem List Decreased strength;Decreased activity tolerance;Decreased mobility;Decreased balance;Decreased knowledge of use of DME       PT Treatment Interventions DME instruction;Gait training;Stair training;Functional mobility training;Therapeutic activities;Therapeutic exercise;Balance training;Neuromuscular re-education    PT Goals (Current goals can be found in the Care Plan section)  Acute Rehab PT Goals Patient Stated Goal: didn't state PT Goal Formulation: With patient Time For Goal Achievement: 03/03/21 Potential to Achieve Goals: Good    Frequency Min 3X/week     Co-evaluation               AM-PAC PT "6 Clicks" Mobility  Outcome Measure Help needed turning from your back  to your side while in a flat bed without using bedrails?: None Help needed moving from lying on your back to sitting on the side of a flat bed without using bedrails?: None Help needed moving to and from a bed to a chair (including a wheelchair)?: A Little Help needed standing up from a chair using your arms (e.g., wheelchair or bedside chair)?: A Little Help needed to walk in hospital room?: A Little Help needed climbing 3-5 steps with a railing? : A Little 6 Click Score: 20    End of Session Equipment Utilized During Treatment: Gait belt Activity Tolerance: Patient tolerated treatment well Patient left: in chair;with call bell/phone within reach;with chair alarm set Nurse Communication: Mobility status PT Visit Diagnosis: Unsteadiness on feet (R26.81);Muscle weakness (generalized) (M62.81);Difficulty in walking, not elsewhere classified (R26.2)    Time: 8657-8469 PT Time Calculation (min) (ACUTE ONLY): 18 min   Charges:   PT Evaluation $PT Eval Moderate Complexity: 1 Mod          Erich Kochan, PT, DPT Acute  Rehabilitation Services Pager #: [phone] Office #: [phone]   Berline Lopes 02/17/2021, 9:05 AM

## 2021-02-17 NOTE — Evaluation (Signed)
Occupational Therapy Evaluation Patient Details Name: Jacob [name] MRN: [id number] DOB: 1945-07-22 Today's Date: 02/17/2021   History of Present Illness Jacob [name] is a 76 y.o. male with a pertinent PMH HTN, CKD stage 4, obesity, history of gastric cancer, and gout presenting to the ED for dysarthria and difficulty using left arm since 6 am this morning 2/4. imaging didn't reveal any acute lesions. L UE weakness and difficulty speaking has resided. Pt was found to have poor kidney function.   Clinical Impression   PT admitted for concerns listed above. PTA pt reported that he was independent with all ADL's and functional mobility, however requiring increased time due to decreased activity tolerance. At this time, physically pt presents with mild balance concerns and weakness, as well as some mild higher level cognitive deficits. OT will continue to follow acutely to progress activity tolerance, strength, and further assess cognition.      Recommendations for follow up therapy are one component of a multi-disciplinary discharge planning process, led by the attending physician.  Recommendations may be updated based on patient status, additional functional criteria and insurance authorization.   Follow Up Recommendations  No OT follow up    Assistance Recommended at Discharge PRN  Patient can return home with the following A little help with bathing/dressing/bathroom    Functional Status Assessment  Patient has had a recent decline in their functional status and demonstrates the ability to make significant improvements in function in a reasonable and predictable amount of time.  Equipment Recommendations  None recommended by OT    Recommendations for Other Services       Precautions / Restrictions Precautions Precautions: Fall Restrictions Weight Bearing Restrictions: No      Mobility Bed Mobility Overal bed mobility: Modified Independent             General  bed mobility comments: increased time, no use of bed rail, minimal HOB elevation    Transfers Overall transfer level: Needs assistance Equipment used: Rolling walker (2 wheels) Transfers: Sit to/from Stand Sit to Stand: From elevated surface, Min guard           General transfer comment: min guard for safety, verbal cues for hand placement, no physical assist, incresaed time with wide base of support      Balance Overall balance assessment: Mild deficits observed, not formally tested (pt with improved stability with RW during amb)                                         ADL either performed or assessed with clinical judgement   ADL Overall ADL's : Needs assistance/impaired                                       General ADL Comments: Supervision to min guard for all ADL's at this time     Vision Baseline Vision/History: 0 No visual deficits Ability to See in Adequate Light: 0 Adequate Patient Visual Report: No change from baseline Vision Assessment?: No apparent visual deficits Additional Comments: Tracking, peripherals, saccades, convergence, and functional acuity appear intact.     Perception     Praxis      Pertinent Vitals/Pain Pain Assessment Pain Assessment: No/denies pain     Hand Dominance Right   Extremity/Trunk Assessment Upper Extremity Assessment Upper Extremity  Assessment: Overall WFL for tasks assessed   Lower Extremity Assessment Lower Extremity Assessment: Overall WFL for tasks assessed   Cervical / Trunk Assessment Cervical / Trunk Assessment: Normal   Communication Communication Communication: No difficulties   Cognition Arousal/Alertness: Awake/alert Behavior During Therapy: WFL for tasks assessed/performed Overall Cognitive Status: Within Functional Limits for tasks assessed                                 General Comments: Follows simple commands, sometimes requiring increased time to  respond     General Comments  VSS on RA    Exercises     Shoulder Instructions      Home Living Family/patient expects to be discharged to:: Private residence Living Arrangements: Spouse/significant other Available Help at Discharge: Family;Available PRN/intermittently Type of Home: House Home Access: Stairs to enter [employer] of Steps: 5 Entrance Stairs-Rails: Right;Left;Can reach both Home Layout: One level     Bathroom Shower/Tub: Occupational psychologist: Standard Bathroom Accessibility: Yes   Home Equipment: Conservation officer, nature (2 wheels)          Prior Functioning/Environment Prior Level of Function : Needs assist             Mobility Comments: has a walker but reports he doesnt use it ADLs Comments: indep, increased time        OT Problem List: Decreased strength;Decreased activity tolerance;Impaired balance (sitting and/or standing);Decreased safety awareness;Decreased knowledge of use of DME or AE      OT Treatment/Interventions: Self-care/ADL training;Therapeutic exercise;Energy conservation;DME and/or AE instruction;Therapeutic activities;Patient/family education;Balance training    OT Goals(Current goals can be found in the care plan section) Acute Rehab OT Goals Patient Stated Goal: To go home OT Goal Formulation: With patient Time For Goal Achievement: 03/03/21 Potential to Achieve Goals: Good  OT Frequency: Min 2X/week    Co-evaluation              AM-PAC OT "6 Clicks" Daily Activity     Outcome Measure Help from another person eating meals?: None Help from another person taking care of personal grooming?: A Little Help from another person toileting, which includes using toliet, bedpan, or urinal?: A Little Help from another person bathing (including washing, rinsing, drying)?: A Little Help from another person to put on and taking off regular upper body clothing?: None Help from another person to put on and taking  off regular lower body clothing?: A Little 6 Click Score: 20   End of Session Equipment Utilized During Treatment: Gait belt Nurse Communication: Mobility status  Activity Tolerance: Patient tolerated treatment well Patient left: in chair;with call bell/phone within reach;with chair alarm set  OT Visit Diagnosis: Other abnormalities of gait and mobility (R26.89);Unsteadiness on feet (R26.81);Muscle weakness (generalized) (M62.81)                Time: 3335-4562 OT Time Calculation (min): 19 min Charges:  OT General Charges $OT Visit: 1 Visit OT Evaluation $OT Eval Moderate Complexity: 1 Mod  Gannon Heinzman H., OTR/L Acute Rehabilitation  Gery Sabedra Elane Diahn Waidelich 02/17/2021, 10:45 AM

## 2021-02-17 NOTE — Progress Notes (Signed)
° °  Subjective: No acute overnight events.   Patient was seen at bedside during rounds today. Pt reports feeling much better compared to when he came in. He reports improvement in speech and left arm trembling.  Pt is updated on the plan for today, and all questions and concerns are addressed.   Objective:  Vital signs in last 24 hours: Vitals:   02/16/21 2024 02/17/21 0010 02/17/21 0400 02/17/21 0723  BP: (!) 150/93 (!) 143/86 (!) 143/91 (!) 131/94  Pulse: 75 77 74 75  Resp: 13   20  Temp: 97.9 F (36.6 C) 98.1 F (36.7 C) 98 F (36.7 C) 98.1 F (36.7 C)  TempSrc: Oral Oral Oral Oral  SpO2: 100%  100% 98%  Weight:      Height:       Constitutional: alert, well-appearing, in NAD  Eyes: anicteric sclera Cardiovascular: RRR, no m/r/g, non-edematous bilateral LE Pulmonary/Chest: normal work of breathing on room air, LCTAB Abdominal: soft, non-tender to palpation, non-distended Extremities: no asterixis, equal strength in bilateral arms and legs  Neurological: A&O x 3, follows commands. Normal speech, no gross focal deficits.   Assessment/Plan:  Principal Problem:   Stroke ([place])  I/O- produced 1.7L urine yesterday Cr- 5.19, near baseline of ~5.  Resolution of symptoms  Foley- voiding trial  BP-  128/88 on hydral 50 tid  Wht bp meds is he taking   Left arm weakness and dysarthria, resolved  Possible uremic encephalopathy (resolved) in s/o AKI on CKD stage 4  Urinary retention  Resolution of symptoms; pt reports feeling back to his baseline. Stroke ruled out. Possibly d/t uremic encephalopathy versus some other transient etiology. Evaluated by neurology and nephrology during hospitalization. Determined to not be uremic with no indication for HD, and OP nephro follow up for dialysis initiation. Cr stable/near office baseline with IVF. Foley removal trial this AM with pt able to void, with post-void ~100cc.  Stable for DC, pending possible fistula by VVS tmrw to expedite  process vs as an outpatient.  - Neurology signed off  - No urgent dialysis indicated by nephrology - Possibly OR tmrw by VVS for fistula vs as an OP - NS at 75 ml/hr x 12 hours - Foley catheter if retaining  - Trend BMP - Strict I/Os and daily weight - Avoid nephrotoxins  - PT/OT recommending no follow up       HTN   Stable  - Continue Hydral 50 tid   Hx of gastric cancer  S/p partial gastrectomy, undergoing active surveillance with oncology. Has a 5 mm upper lobe pulmonary nodule that is thought to be unlikely metastatic disease, but plan for repeat imaging in the OP setting with Dr Alen Blew.  -Continue outpatient follow up with oncology    Hx of prostate cancer  BPH He is status post radiation therapy for definitive treatment for his prostate cancer (2013) - Continue Flomax    Best Practice: Diet: NPO IVF: None,None Code: Full   Lajean Manes, MD  Internal Medicine Resident, PGY-1 Pager: [phone] After 5pm on weekdays and 1pm on weekends: On Call pager [phone]

## 2021-02-17 NOTE — Consult Note (Addendum)
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  Creatinine has fluctuated in the outpatient setting.  Previously was around 3.5 but more recently in our office creatinine was around 5.  Creatinine now slightly above baseline possibly related to dehydration but also urinary retention.  He does not seem very uremic to me at this time.  Creatinine and BUN are improving -Continue with Foley catheter -Has received about 1.5 L of IV fluids.  Drink to thirst -Continue to monitor daily Cr, Dose meds for GFR -Monitor Daily I/Os, Daily weight  -Maintain MAP>65 for optimal renal perfusion.  -Avoid nephrotoxic medications including NSAIDs and Vanc/Zosyn combo -Currently no indication for HD; will continue to monitor for now -Referred to [facility] outpatient; does not have HD access at this time  Dysarthria/left arm weakness: Not felt to be a stroke by neurology.  More concern for uremia.  Could have been the case but patient appears much better at this time.  We will continue to monitor his progress  Gout: Patient is on allopurinol  History of gastric cancer: Undergoing surveillance.  History of prostate cancer/BPH: Status post radiation remotely around 2013.  Some signs of urinary retention right now.  Foley catheter in place. On flomax  Hypertension: Can continue current medications    Recommendations conveyed to primary service.    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 male with a past  medical history of HTN, CKD, obesity, gastric cancer who presents with dysarthria and left arm issues.  Patient states that has been feeling fairly well but yesterday started having an issue using his left arm described his weakness.  Also felt like he was possibly more confused and having a hard time talking.  Patient admits to having some possible dementia at baseline.  He was not having any fevers, chills, shortness of breath, chest pain.  Does have some dyspnea on exertion which is unchanged over the past year.  No nausea or vomiting.  Denies any NSAID use.  In the emergency department neurology was consulted and did not feel like he was having a stroke.  Creatinine was 5.9.  He received some IV fluids in the ER.  Overnight he was found to have urinary retention with greater than 700 cc of urine on bladder scan.  Foley catheter was placed.  Patient states he feels a lot better today.  Arm is easier to use.  Feels like he is coherent and understands was going on.  Was recently seen in our office by Dr. Joelyn Oms.  Creatinine was around 5 in the outpatient setting and there were plans to get him set up with dialysis nonurgently.   Medications:  Current Facility-Administered Medications  Medication Dose Route Frequency Provider Last Rate Last Admin   aspirin EC tablet 81 mg  81 mg Oral Daily Rick Duff, MD   81 mg at 02/17/21 0938   Chlorhexidine Gluconate Cloth 2 % PADS 6 each  6 each Topical Daily Charise Killian, MD   6 each at 02/17/21 0953   pantoprazole (PROTONIX) EC tablet 40 mg  40 mg  Oral QAC supper Rick Duff, MD   40 mg at 02/16/21 2223   rosuvastatin (CRESTOR) tablet 10 mg  10 mg Oral Daily Rick Duff, MD   10 mg at 02/17/21 7371   tamsulosin (FLOMAX) capsule 0.4 mg  0.4 mg Oral Daily Rick Duff, MD   0.4 mg at 02/17/21 0626   vitamin B-12 (CYANOCOBALAMIN) tablet 500 mcg  500 mcg Oral Daily Rick Duff, MD   500 mcg at 02/17/21 9485      ALLERGIES Lisinopril  MEDICAL HISTORY Past Medical History:  Diagnosis Date   Anemia    hx iron deficiency   Anxiety    Arthritis    gout   Back pain    BPH with obstruction/lower urinary tract symptoms    Chronic kidney disease    "they said I do"   Constipation    Depression    ED (erectile dysfunction)    Epigastric pain    GERD (gastroesophageal reflux disease)    Heartburn    History of blood transfusion    History of radiation therapy 11/03/11-12/29/11   prostate   Hypercholesterolemia    Hypertension    Night sweats    Oxygen deficiency    2x per week at night for sleep   Post-operative nausea and vomiting 04/09/2016   Prostate cancer ([place]) 08/14/11   Adenocarcinoma,gleason:3+3=6,& 3+4=7,PSA=5.66   PUD (peptic ulcer disease)    Sleep apnea    does not use Cpap but does use O2 @ 2l    Stomach cancer ([place])    Ulcer    peptic ulcer hx     SOCIAL HISTORY Social History   Socioeconomic History   Marital status: Married    Spouse name: [name]   Number of children: 3   Years of education: 12   Highest education level: Not on file  Occupational History   Occupation: retired    Fish farm manager: [employer]  Tobacco Use   Smoking status: Former    Packs/day: 1.50    Years: 30.00    Pack years: 45.00    Types: Cigarettes    Quit date: 04/13/2000    Years since quitting: 20.8   Smokeless tobacco: Never  Vaping Use   Vaping Use: Never used  Substance and Sexual Activity   Alcohol use: Not Currently    Alcohol/week: 1.0 standard drink    Types: 1 Cans of beer per week    Comment: a beer about once a week, less than in his youth   Drug use: No   Sexual activity: Not Currently  Other Topics Concern   Not on file  Social History Narrative   Patient is married [name]) and lives at home with his wife and one child.   Patient has three children.   Patient is retired.   Patient has a high school education.   Patient is ambi-dextrous.   Patient drinks two  cups of coffee about three times a week.    Social Determinants of Health   Financial Resource Strain: Not on file  Food Insecurity: Not on file  Transportation Needs: Not on file  Physical Activity: Not on file  Stress: Not on file  Social Connections: Not on file  Intimate Partner Violence: Not on file     FAMILY HISTORY Family History  Problem Relation Age of Onset   Cancer Mother        NOS   Alcohol abuse Father    Alcohol abuse Brother 107   Cancer Paternal Aunt  NOS   Colon cancer Neg Hx       Review of Systems: 12 systems reviewed Otherwise as per HPI, all other systems reviewed and negative  Physical Exam: Vitals:   02/17/21 0400 02/17/21 0723  BP: (!) 143/91 (!) 131/94  Pulse: 74 75  Resp:  20  Temp: 98 F (36.7 C) 98.1 F (36.7 C)  SpO2: 100% 98%   No intake/output data recorded.  Intake/Output Summary (Last 24 hours) at 02/17/2021 1015 Last data filed at 02/17/2021 0400 Gross per 24 hour  Intake 1000 ml  Output 2098 ml  Net -1098 ml   General: well-appearing, no acute distress HEENT: anicteric sclera, oropharynx clear without lesions CV: regular rate, no obvious murmur, trace pitting edema in the bilateral ankles Lungs: clear to auscultation bilaterally, normal work of breathing Abd: soft, non-tender, non-distended Skin: no visible lesions or rashes Psych: alert, engaged, appropriate mood and affect Musculoskeletal: no obvious deformities Neuro: normal speech, no gross focal deficits   Test Results Reviewed Lab Results  Component Value Date   NA 139 02/17/2021   K 4.0 02/17/2021   CL 106 02/17/2021   CO2 23 02/17/2021   BUN 72 (H) 02/17/2021   CREATININE 4.97 (H) 02/17/2021   CALCIUM 9.3 02/17/2021   ALBUMIN 3.4 (L) 02/16/2021   PHOS 3.6 12/22/2019     I have reviewed all relevant outside healthcare records related to the patient's current hospitalization

## 2021-02-17 NOTE — Care Management Obs Status (Signed)
[facility] NOTIFICATION   Patient Details  Name: Hani Campusano MRN: [id number] Date of Birth: 28-May-1945   Medicare Observation Status Notification Given:  Yes    Carles Collet, RN 02/17/2021, 4:08 PM

## 2021-02-18 ENCOUNTER — Observation Stay (HOSPITAL_BASED_OUTPATIENT_CLINIC_OR_DEPARTMENT_OTHER): Payer: Medicare Other

## 2021-02-18 DIAGNOSIS — N400 Enlarged prostate without lower urinary tract symptoms: Secondary | ICD-10-CM

## 2021-02-18 DIAGNOSIS — I129 Hypertensive chronic kidney disease with stage 1 through stage 4 chronic kidney disease, or unspecified chronic kidney disease: Secondary | ICD-10-CM

## 2021-02-18 DIAGNOSIS — N186 End stage renal disease: Secondary | ICD-10-CM

## 2021-02-18 DIAGNOSIS — N184 Chronic kidney disease, stage 4 (severe): Secondary | ICD-10-CM | POA: Diagnosis not present

## 2021-02-18 DIAGNOSIS — N185 Chronic kidney disease, stage 5: Secondary | ICD-10-CM | POA: Diagnosis not present

## 2021-02-18 DIAGNOSIS — N179 Acute kidney failure, unspecified: Secondary | ICD-10-CM | POA: Diagnosis not present

## 2021-02-18 LAB — CBC
HCT: 26.8 % — ABNORMAL LOW (ref 39.0–52.0)
Hemoglobin: 8.6 g/dL — ABNORMAL LOW (ref 13.0–17.0)
MCH: 31.9 pg (ref 26.0–34.0)
MCHC: 32.1 g/dL (ref 30.0–36.0)
MCV: 99.3 fL (ref 80.0–100.0)
Platelets: 161 10*3/uL (ref 150–400)
RBC: 2.7 MIL/uL — ABNORMAL LOW (ref 4.22–5.81)
RDW: 13.2 % (ref 11.5–15.5)
WBC: 7.8 10*3/uL (ref 4.0–10.5)
nRBC: 0 % (ref 0.0–0.2)

## 2021-02-18 LAB — BASIC METABOLIC PANEL
Anion gap: 10 (ref 5–15)
BUN: 78 mg/dL — ABNORMAL HIGH (ref 8–23)
CO2: 22 mmol/L (ref 22–32)
Calcium: 9.2 mg/dL (ref 8.9–10.3)
Chloride: 104 mmol/L (ref 98–111)
Creatinine, Ser: 5.19 mg/dL — ABNORMAL HIGH (ref 0.61–1.24)
GFR, Estimated: 11 mL/min — ABNORMAL LOW (ref >60)
Glucose, Bld: 78 mg/dL (ref 70–99)
Potassium: 4.5 mmol/L (ref 3.5–5.1)
Sodium: 136 mmol/L (ref 135–145)

## 2021-02-18 MED ORDER — SODIUM CHLORIDE 0.9 % IV SOLN: INTRAVENOUS | Status: AC

## 2021-02-18 NOTE — Progress Notes (Addendum)
[facility] Progress Note  Name: Jacob [name] MRN: [id number] DOB: [birth year]   Subjective:   he had 1.7 liters UOP over 2/5 charted.  Outpatient nephrologist hoping for AVF creation soon.  He has been referred to [facility] but no appt yet.  Per outpatient nephrologist patient with limited insight but does make his decisions.  Pt tells me he came in because he "felt he was going to faint"  Review of systems:  Denies shortness of breath or chest pain  Denies n/v  -------------------- Background on consult:  Jacob [name] is a/an 75 y.o. male with a past medical history of HTN, CKD, obesity, gastric cancer who presents with dysarthria and left arm issues. Patient states that has been feeling fairly well but yesterday started having an issue using his left arm described his weakness.  Also felt like he was possibly more confused and having a hard time talking.  Patient admits to having some possible dementia at baseline.  He was not having any fevers, chills, shortness of breath, chest pain.  Does have some dyspnea on exertion which is unchanged over the past year.  No nausea or vomiting.  Denies any NSAID use.  In the emergency department neurology was consulted and did not feel like he was having a stroke.  Creatinine was 5.9.  He received some IV fluids in the ER.  Overnight he was found to have urinary retention with greater than 700 cc of urine on bladder scan.  Foley catheter was placed.  Patient states he feels a lot better today.  Arm is easier to use.  Feels like he is coherent and understands was going on.  Was recently seen in our office by Dr. Joelyn Oms.  Creatinine was around 5 in the outpatient setting and there were plans to get him set up with dialysis nonurgently.   Intake/Output Summary (Last 24 hours) at 02/18/2021 1316 Last data filed at 02/18/2021 0900 Gross per 24 hour  Intake 836 ml  Output 1200 ml  Net -364 ml    Vitals:  Vitals:   02/18/21 0415  02/18/21 0504 02/18/21 0755 02/18/21 1143  BP: 128/88  (!) 141/86 132/87  Pulse: 68  79 80  Resp: 13  20 17   Temp: 98.6 F (37 C)  98.4 F (36.9 C) (!) 97.5 F (36.4 C)  TempSrc: Oral   Oral  SpO2: 100%  100% 100%  Weight:  110.9 kg    Height:         Physical Exam:  General adult male in bed in no acute distress HEENT normocephalic atraumatic extraocular movements intact sclera anicteric Neck supple trachea midline Lungs clear to auscultation bilaterally normal work of breathing at rest on room air Heart S1S2 no rub Abdomen soft nontender nondistended Extremities no edema  Psych normal mood and affect Neuro - alert and oriented x3 provides basic hx and follows commands   Medications reviewed   Labs:  BMP Latest Ref Rng & Units 02/18/2021 02/17/2021 02/16/2021  Glucose 70 - 99 mg/dL 78 103(H) 84  BUN 8 - 23 mg/dL 78(H) 72(H) 83(H)  Creatinine 0.61 - 1.24 mg/dL 5.19(H) 4.97(H) 5.90(H)  Sodium 135 - 145 mmol/L 136 139 140  Potassium 3.5 - 5.1 mmol/L 4.5 4.0 4.1  Chloride 98 - 111 mmol/L 104 106 104  CO2 22 - 32 mmol/L 22 23 -  Calcium 8.9 - 10.3 mg/dL 9.2 9.3 -     Assessment/Plan:   Jacob [name] is a/an 76 y.o. 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  Creatinine has fluctuated in the outpatient setting.  Previously was around 3.5 but more recently in our office creatinine was around 5.  Creatinine now slightly above baseline possibly related to dehydration but also urinary retention.  He does not seem very uremic to me at this time.  Creatinine and BUN are improving -Continue with Foley catheter - Cr essentially stable today - gentle IV fluids today - NS at 75 ml/hr x 12 hours -Currently no indication for HD -Referred to [facility] outpatient; does not have HD access at this time.  I will call [facility] to ensure referral rec'd and to schedule  surgery soon   Dysarthria/left arm weakness: Not felt to be a stroke by neurology.  More concern for uremia.  Could have been the case but patient appears much better at this time.  We will continue to monitor his progress   Gout: Patient is on allopurinol   History of gastric cancer: Undergoing surveillance.   History of prostate cancer/BPH: Status post radiation remotely around 2013.  Some signs of urinary retention right now.  Foley catheter in place. On flomax. Would continue foley and follow-up with urology    Hypertension: controlled  Anemia CKD -  no emergent need for PRBC's; undergoing surveillance with hx gastric cancer. Update iron panel  Disposition - per primary team.     Claudia Desanctis, MD 02/18/2021 1:16 PM   Spoke with [facility] and they will assess.  He may be able to have AV Fistula tomorrow if there is OR availability but if not then would follow-up with them soon in clinic for AV fistula soon.   Claudia Desanctis, MD 1:41 PM 02/18/2021

## 2021-02-18 NOTE — Progress Notes (Signed)
Occupational Therapy Treatment Patient Details Name: Jacob [name] MRN: [id number] DOB: 1945-04-21 Today's Date: 02/18/2021   History of present illness Jacob [name] is a 76 y.o. male with a pertinent PMH HTN, CKD stage 4, obesity, history of gastric cancer, and gout presenting to the ED for dysarthria and difficulty using left arm since 6 am this morning 2/4. imaging didn't reveal any acute lesions. L UE weakness and difficulty speaking has resided. Pt was found to have poor kidney function.   OT comments  Pt is making steady progress towards his acute OT goals. Session focused on cognitive IADL med management task. Pt spent more than 5 minutes completing task, and had 3 errors therefore he technically failed. However he accurately placed 4/5 bottles and verbalized understanding of errors and how to correct after review. He also required cues for safety and RW management during ADLs. He continues to benefit form OT acutely. D/c recommendation remains appropriate.    Recommendations for follow up therapy are one component of a multi-disciplinary discharge planning process, led by the attending physician.  Recommendations may be updated based on patient status, additional functional criteria and insurance authorization.    Follow Up Recommendations  No OT follow up    Assistance Recommended at Discharge PRN  Patient can return home with the following  A little help with bathing/dressing/bathroom   Equipment Recommendations  None recommended by OT       Precautions / Restrictions Precautions Precautions: Fall Restrictions Weight Bearing Restrictions: No       Mobility Bed Mobility Overal bed mobility: Modified Independent                  Transfers Overall transfer level: Needs assistance Equipment used: Rolling walker (2 wheels) Transfers: Sit to/from Stand Sit to Stand: Supervision                 Balance Overall balance assessment: Needs  assistance Sitting-balance support: Feet supported Sitting balance-Leahy Scale: Good     Standing balance support: During functional activity, No upper extremity supported Standing balance-Leahy Scale: Fair                             ADL either performed or assessed with clinical judgement   ADL Overall ADL's : Needs assistance/impaired     Grooming: Wash/dry hands;Supervision/safety;Standing               Lower Body Dressing: Minimal assistance;Sit to/from stand Lower Body Dressing Details (indicate cue type and reason): min A for verbal cue to sit to don LB clothign for safety & to reduce fall risk Toilet Transfer: Min guard;Rolling walker (2 wheels);Regular Toilet;Ambulation Toilet Transfer Details (indicate cue type and reason): mildly unsteady wtih poor RW management Toileting- Clothing Manipulation and Hygiene: Supervision/safety;Sitting/lateral lean       Functional mobility during ADLs: Min guard;Rolling walker (2 wheels) General ADL Comments: pt had poor RW management and benefitted from cues for safety and insight throughout    Extremity/Trunk Assessment Upper Extremity Assessment Upper Extremity Assessment: Generalized weakness   Lower Extremity Assessment Lower Extremity Assessment: Overall WFL for tasks assessed        Vision   Vision Assessment?: No apparent visual deficits   Perception Perception Perception: Within Functional Limits   Praxis Praxis Praxis: Intact    Cognition Arousal/Alertness: Awake/alert Behavior During Therapy: WFL for tasks assessed/performed, Flat affect Overall Cognitive Status: Within Functional Limits for tasks assessed  General Comments: pill box assessment completed this session. Pt took >5 minutes which is techniqually an automatic fail. He also mis-read a pill bottle and placed a pill 1x day for 7 days, and it was supposed to be "every other day."  (= 3 errors & another fail). Otherwise pt  completed all other aspects of the pill box assessment correctly. After review of mistake, pt verbalized understanding of importance to have assistance for med mgmt at d/c.              General Comments VSS on RA    Pertinent Vitals/ Pain       Pain Assessment Pain Assessment: No/denies pain   Frequency  Min 2X/week        Progress Toward Goals  OT Goals(current goals can now be found in the care plan section)  Progress towards OT goals: Progressing toward goals  Acute Rehab OT Goals Patient Stated Goal: home soon OT Goal Formulation: With patient Time For Goal Achievement: 03/03/21 Potential to Achieve Goals: Good ADL Goals Additional ADL Goal #1: Pt will complete medical management tasks with 100% accuracy, independently. Additional ADL Goal #2: Pt will complete multistep pathfinding activity, independently.  Plan Discharge plan remains appropriate       AM-PAC OT "6 Clicks" Daily Activity     Outcome Measure   Help from another person eating meals?: None Help from another person taking care of personal grooming?: A Little Help from another person toileting, which includes using toliet, bedpan, or urinal?: A Little Help from another person bathing (including washing, rinsing, drying)?: A Little Help from another person to put on and taking off regular upper body clothing?: None Help from another person to put on and taking off regular lower body clothing?: A Little 6 Click Score: 20    End of Session Equipment Utilized During Treatment: Gait belt;Rolling walker (2 wheels)  OT Visit Diagnosis: Other abnormalities of gait and mobility (R26.89);Unsteadiness on feet (R26.81);Muscle weakness (generalized) (M62.81)   Activity Tolerance Patient tolerated treatment well   Patient Left in bed;with bed alarm set;with call bell/phone within reach   Nurse Communication Mobility status        Time: 1610-1630 OT Time Calculation (min): 20 min  Charges: OT General  Charges $OT Visit: 1 Visit OT Treatments $Self Care/Home Management : 8-22 mins   Oral Remache A Valecia Beske 02/18/2021, 4:59 PM

## 2021-02-18 NOTE — Progress Notes (Incomplete)
° °  Subjective: No acute overnight events.   Patient was seen at bedside during rounds today. Pt reports feeling much better compared to when he came in. He reports improvement in speech and left arm trembling.  Pt is updated on the plan for today, and all questions and concerns are addressed.   Objective:  Vital signs in last 24 hours: Vitals:   02/18/21 0415 02/18/21 0504 02/18/21 0755 02/18/21 1143  BP: 128/88  (!) 141/86 132/87  Pulse: 68  79 80  Resp: 13  20 17   Temp: 98.6 F (37 C)  98.4 F (36.9 C) (!) 97.5 F (36.4 C)  TempSrc: Oral   Oral  SpO2: 100%  100% 100%  Weight:  110.9 kg    Height:       Constitutional: alert, well-appearing, in NAD  Eyes: anicteric sclera Cardiovascular: RRR, no m/r/g, non-edematous bilateral LE Pulmonary/Chest: normal work of breathing on room air, LCTAB Abdominal: soft, non-tender to palpation, non-distended Extremities: no asterixis, equal strength in bilateral arms and legs  Neurological: A&O x 3, follows commands. Normal speech, no gross focal deficits.   Assessment/Plan:  Principal Problem:   Stroke ([place])  Fistula  I/O-   Fluids  Cr-   BP-  *** on hydral 50 tid   Left arm weakness and dysarthria, resolved  Possible uremic encephalopathy (resolved) in s/o AKI on CKD stage 4  Urinary retention  Resolution of symptoms; pt reports feeling back to his baseline. Stroke ruled out. Possibly d/t uremic encephalopathy versus some other transient etiology. Evaluated by neurology and nephrology during hospitalization. Determined to not be uremic with no indication for HD, and OP nephro follow up for dialysis initiation. Cr stable/near office baseline with IVF. Foley removal trial this AM with pt able to void, with post-void ~100cc.  Stable for DC, pending possible fistula by [facility] tmrw to expedite process vs as an outpatient.  - Neurology signed off  - No urgent dialysis indicated by nephrology - Possibly OR tmrw by [facility] for fistula vs as an  OP - NS at 75 ml/hr x 12 hours - Foley catheter if retaining  - Trend BMP - Strict I/Os and daily weight - Avoid nephrotoxins  - PT/OT recommending no follow up       HTN   Stable  - Continue Hydral 50 tid   Hx of gastric cancer  S/p partial gastrectomy, undergoing active surveillance with oncology. Has a 5 mm upper lobe pulmonary nodule that is thought to be unlikely metastatic disease, but plan for repeat imaging in the OP setting with Dr Alen Blew.  -Continue outpatient follow up with oncology    Hx of prostate cancer  BPH He is status post radiation therapy for definitive treatment for his prostate cancer (2013) - Continue Flomax    Best Practice: Diet: NPO IVF: None,None Code: Full   Lajean Manes, MD  Internal Medicine Resident, PGY-1 Pager: [phone] After 5pm on weekdays and 1pm on weekends: On Call pager [phone]

## 2021-02-18 NOTE — Consult Note (Signed)
Hospital Consult    Reason for Consult: Long-term HD access Requesting Physician: Hospital medicine MRN #:  [id number]  History of Present Illness: This is a left-handed 76 y.o. male who initially presented to [facility] ED with concern for stroke.  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  I was called by nephrology as he is currently chronic kidney disease stage IV, and will need long-term HD access.  He is not on dialysis nor does he have a tunneled HD line.  On exam, Jacob [name] was doing well.  He had no complaints.  A native of [place], he graduated from [employer].  He has been married for over 40 years and all of his children still live in the [place] area.  He was aware that he will likely need dialysis in the coming months, and was interested in pursuing fistula creation prior to hospital discharge.    Past Medical History:  Diagnosis Date   Anemia    hx iron deficiency   Anxiety    Arthritis    gout   Back pain    BPH with obstruction/lower urinary tract symptoms    Chronic kidney disease    "they said I do"   Constipation    Depression    ED (erectile dysfunction)    Epigastric pain    GERD (gastroesophageal reflux disease)    Heartburn    History of blood transfusion    History of radiation therapy 11/03/11-12/29/11   prostate   Hypercholesterolemia    Hypertension    Night sweats    Oxygen deficiency    2x per week at night for sleep   Post-operative nausea and vomiting 04/09/2016   Prostate cancer ([place]) 08/14/11   Adenocarcinoma,gleason:3+3=6,& 3+4=7,PSA=5.66   PUD (peptic ulcer disease)    Sleep apnea    does not use Cpap but does use O2 @ 2l    Stomach cancer ([place])    Ulcer    peptic ulcer hx    Past Surgical History:  Procedure Laterality Date   colon polyps bx  11/24/06   colon,transverse and rectosigmoid polyps:tubular adenomas and hyperplastic polyps,no high  grade dysplasia or malignancy    COLONOSCOPY WITH PROPOFOL N/A 08/24/2019   Procedure: COLONOSCOPY WITH PROPOFOL;  Surgeon: [name], DO;  Location: [facility];  Service: Gastroenterology;  Laterality: N/A;   duodenal bx  11/24/06   benign   ESOPHAGOGASTRODUODENOSCOPY N/A 10/27/2015   Procedure: ESOPHAGOGASTRODUODENOSCOPY (EGD);  Surgeon: [name], MD;  Location: [facility];  Service: Endoscopy;  Laterality: N/A;   EUS N/A 11/08/2015   Procedure: UPPER ENDOSCOPIC ULTRASOUND (EUS) RADIAL;  Surgeon: [name], MD;  Location: [facility];  Service: Endoscopy;  Laterality: N/A;   GASTRECTOMY N/A 03/25/2016   Procedure: DISTAL GASTRECTOMY;  Surgeon: [name], MD;  Location: [place];  Service: General;  Laterality: N/A;   gastric bx  11/24/06   chronic active gastritis,with metaplasia and focal changaes of xanthelasma   GASTROSTOMY N/A 03/25/2016   Procedure: INSERTION OF FEEDING TUBE;  Surgeon: [name], MD;  Location: [place];  Service: General;  Laterality: N/A;   INSERTION PROSTATE RADIATION SEED  12-29-11   IR GENERIC HISTORICAL  04/01/2016   IR [place] TUBE CHANGE 04/01/2016 Markus Daft, MD [facility]   IR GENERIC HISTORICAL  04/05/2016   IR [employer] TUBE CHANGE 04/05/2016 Markus Daft, MD [facility]   LAPAROSCOPIC GASTRECTOMY  03/25/2016  Diagnostic laparoscopy, distal gastrectomy with Billroth 2 reconstruction and gastrojejunostomy tube   LAPAROSCOPY N/A 03/25/2016   Procedure: DIAGNOSTIC LAPAROSCOPY;  Surgeon: [name], MD;  Location: [place];  Service: General;  Laterality: N/A;   POLYPECTOMY  08/24/2019   Procedure: POLYPECTOMY;  Surgeon: [name], DO;  Location: [facility];  Service: Gastroenterology;;   PORTACATH PLACEMENT Left 12/04/2015   Procedure: INSERTION PORT-A-CATH;  Surgeon: [name], MD;  Location: [place];  Service: General;  Laterality: Left;   PROSTATE BIOPSY  08/14/11   Adenocarcinoma/volume=58.51cc,gleason=3+3=6 & 3+4=7   TONSILLECTOMY      76 years old    Allergies  Allergen Reactions   Lisinopril Cough    Prior to Admission medications   Medication Sig Start Date End Date Taking? Authorizing Provider  acetaminophen (TYLENOL) 500 MG tablet Take 1,000 mg by mouth daily as needed for moderate pain or headache.   Yes [provider]  buPROPion (WELLBUTRIN XL) 300 MG 24 hr tablet Take 300 mg by mouth daily.  05/10/15  Yes [provider]  furosemide (LASIX) 20 MG tablet Take 20 mg by mouth daily.    Yes [provider]  hydrALAZINE (APRESOLINE) 50 MG tablet Take 50 mg by mouth 3 (three) times daily. 02/05/21  Yes [provider]  Nebivolol HCl 20 MG TABS Take 20 mg by mouth daily.   Yes [provider]  rosuvastatin (CRESTOR) 5 MG tablet Take 5 mg by mouth daily. Patient states he takes 15 MG daily 02/05/21  Yes [provider]  sodium bicarbonate 650 MG tablet Take 650 mg by mouth 2 (two) times daily.   Yes [provider]  sodium zirconium cyclosilicate (LOKELMA) 10 g PACK packet Take 10 g by mouth daily.   Yes [provider]  tamsulosin (FLOMAX) 0.4 MG CAPS capsule Take 1 capsule by mouth every day 10/10/20  Yes McKenzie, Candee Furbish, MD  verapamil (CALAN) 80 MG tablet Take 80 mg by mouth 3 (three) times daily.    Yes [provider]  allopurinol (ZYLOPRIM) 300 MG tablet Take 150 mg by mouth daily. Patient not taking: Reported on 02/16/2021 04/15/19   [provider]  aspirin EC 81 MG tablet Take 1 tablet (81 mg total) by mouth daily. Swallow whole. Patient not taking: Reported on 02/16/2021 01/10/21   Tobb, Godfrey Pick, DO  calcium carbonate (TUMS - DOSED IN MG ELEMENTAL CALCIUM) 500 MG chewable tablet Chew 3 tablets by mouth 2 (two) times daily as needed for indigestion or heartburn. Patient not taking: Reported on 02/16/2021    [provider]  diclofenac Sodium (VOLTAREN) 1 % GEL Apply 2 g topically 4 (four) times daily. Patient not  taking: Reported on 02/16/2021 11/06/20   Hans Eden, NP  donepezil (ARICEPT) 5 MG tablet Take 1 tablet (5 mg total) by mouth at bedtime. Patient not taking: Reported on 02/16/2021 08/13/20   Dohmeier, Asencion Partridge, MD  hydrALAZINE (APRESOLINE) 25 MG tablet Take 25 mg by mouth 3 (three) times daily.  Patient not taking: Reported on 02/07/2021 08/05/18   [provider]  lidocaine (XYLOCAINE) 5 % ointment Apply 1 application topically as needed. Patient not taking: Reported on 02/16/2021 11/06/20   Hans Eden, NP  melatonin 3 MG TABS tablet Take 9 mg by mouth at bedtime. Patient not taking: Reported on 02/16/2021    [provider]  pantoprazole (PROTONIX) 40 MG tablet Take 1 tablet (40 mg total) by mouth daily before supper. Patient not taking: Reported  on 02/16/2021 09/18/18   Kayleen Memos, DO  polyethylene glycol (MIRALAX / GLYCOLAX) 17 g packet Take 17 g by mouth 2 (two) times daily. Patient not taking: Reported on 02/16/2021 09/18/18   Kayleen Memos, DO  prochlorperazine (COMPAZINE) 10 MG tablet TAKE ONE TABLET BY MOUTH EVERY 6 HOURS AS NEEDED FOR NAUSSEA AND VOMITING Patient not taking: Reported on 11/23/2020 12/13/19   Wyatt Portela, MD  rosuvastatin (CRESTOR) 10 MG tablet Take 1 tablet (10 mg total) by mouth daily. Patient not taking: Reported on 02/16/2021 01/10/21 04/10/21  Tobb, Godfrey Pick, DO  venlafaxine XR (EFFEXOR-XR) 150 MG 24 hr capsule Take 150 mg by mouth every morning. For depression/anxiety Patient not taking: Reported on 02/16/2021 02/29/20   [provider]    Social History   Socioeconomic History   Marital status: Married    Spouse name: [name]   Number of children: 3   Years of education: 12   Highest education level: Not on file  Occupational History   Occupation: retired    Fish farm manager: [employer]  Tobacco Use   Smoking status: Former    Packs/day: 1.50    Years: 30.00    Pack years: 45.00    Types: Cigarettes    Quit date: 04/13/2000     Years since quitting: 20.8   Smokeless tobacco: Never  Vaping Use   Vaping Use: Never used  Substance and Sexual Activity   Alcohol use: Not Currently    Alcohol/week: 1.0 standard drink    Types: 1 Cans of beer per week    Comment: a beer about once a week, less than in his youth   Drug use: No   Sexual activity: Not Currently  Other Topics Concern   Not on file  Social History Narrative   Patient is married [name]) and lives at home with his wife and one child.   Patient has three children.   Patient is retired.   Patient has a high school education.   Patient is ambi-dextrous.   Patient drinks two cups of coffee about three times a week.    Social Determinants of Health   Financial Resource Strain: Not on file  Food Insecurity: Not on file  Transportation Needs: Not on file  Physical Activity: Not on file  Stress: Not on file  Social Connections: Not on file  Intimate Partner Violence: Not on file    Family History  Problem Relation Age of Onset   Cancer Mother        NOS   Alcohol abuse Father    Alcohol abuse Brother 64   Cancer Paternal Aunt        NOS   Colon cancer Neg Hx     ROS: Otherwise negative unless mentioned in HPI  Physical Examination  Vitals:   02/18/21 0755 02/18/21 1143  BP: (!) 141/86 132/87  Pulse: 79 80  Resp: 20 17  Temp: 98.4 F (36.9 C) (!) 97.5 F (36.4 C)  SpO2: 100% 100%   Body mass index is 31.39 kg/m.  General:  WDWN in NAD Gait: Not observed HENT: WNL, normocephalic Pulmonary: normal non-labored breathing, without Rales, rhonchi,  wheezing Cardiac: regular Abdomen:  soft, NT/ND, no masses Skin: without rashes Vascular Exam/Pulses: 2 + radial and ulnar pulse  Extremities: without ischemic changes, without Gangrene , without cellulitis; without open wounds;  Musculoskeletal: no muscle wasting or atrophy  Neurologic: A&O X 3;  No focal weakness or paresthesias are detected; speech is fluent/normal Psychiatric:  The  pt has Normal affect. Lymph:  Unremarkable  CBC    Component Value Date/Time   WBC 7.8 02/18/2021 0105   RBC 2.70 (L) 02/18/2021 0105   HGB 8.6 (L) 02/18/2021 0105   HGB 8.6 (L) 12/24/2020 1333   HGB 10.4 (L) 01/16/2017 0924   HCT 26.8 (L) 02/18/2021 0105   HCT 32.6 (L) 01/16/2017 0924   PLT 161 02/18/2021 0105   PLT 150 12/24/2020 1333   PLT 191 01/16/2017 0924   MCV 99.3 02/18/2021 0105   MCV 101.9 (H) 01/16/2017 0924   MCH 31.9 02/18/2021 0105   MCHC 32.1 02/18/2021 0105   RDW 13.2 02/18/2021 0105   RDW 15.1 (H) 01/16/2017 0924   LYMPHSABS 1.3 02/16/2021 1403   LYMPHSABS 1.1 01/16/2017 0924   MONOABS 0.7 02/16/2021 1403   MONOABS 0.7 01/16/2017 0924   EOSABS 0.5 02/16/2021 1403   EOSABS 0.5 01/16/2017 0924   BASOSABS 0.1 02/16/2021 1403   BASOSABS 0.0 01/16/2017 0924    BMET    Component Value Date/Time   NA 136 02/18/2021 0105   NA 141 01/16/2017 0924   K 4.5 02/18/2021 0105   K 4.8 01/16/2017 0924   CL 104 02/18/2021 0105   CO2 22 02/18/2021 0105   CO2 25 01/16/2017 0924   GLUCOSE 78 02/18/2021 0105   GLUCOSE 80 01/16/2017 0924   BUN 78 (H) 02/18/2021 0105   BUN 24.0 01/16/2017 0924   CREATININE 5.19 (H) 02/18/2021 0105   CREATININE 3.53 (HH) 12/24/2020 1333   CREATININE 1.9 (H) 01/16/2017 0924   CALCIUM 9.2 02/18/2021 0105   CALCIUM 9.8 01/16/2017 0924   GFRNONAA 11 (L) 02/18/2021 0105   GFRNONAA 17 (L) 12/24/2020 1333   GFRAA 25 (L) 06/21/2019 1412    COAGS: Lab Results  Component Value Date   INR 1.1 02/16/2021   INR 1.1 05/11/2020   INR 1.11 04/01/2016     Non-Invasive Vascular Imaging:   Vein mapping demonstrates adequate basilic vein bilaterally   ASSESSMENT/PLAN: This is a 76 y.o. male with chronic kidney disease in need for long-term HD access.  I had a long discussion with Jacob [name] regarding fistula creation as well as hemodialysis.   Based on vein mapping and examination, Jacob [name] has suitable vein for left or right basilic vein  arteriovenous fistula creation. Being that he is left-handed, we discussed right-sided AV fistula creation. I had an extensive discussion with this patient in regards to the nature of access surgery, including risk, benefits, and alternatives.   The patient is aware that the risks of access surgery include but are not limited to: bleeding, infection, steal syndrome, nerve damage, ischemic monomelic neuropathy, failure of access to mature, complications related to venous hypertension, and possible need for additional access procedures in the future. I will discuss the need for second stage superficialization prior to the OR tomorrow.  His other option is arteriovenous graft. Please make n.p.o. tonight  Jacob John, MD [facility] [phone]

## 2021-02-18 NOTE — Progress Notes (Signed)
°  Transition of Care [facility]) Screening Note   Patient Details  Name: Jacob [name] Date of Birth: 10-25-1945   Transition of Care [facility]) CM/SW Contact:    Pollie Friar, RN Phone Number: 02/18/2021, 3:19 PM    Transition of Care Department [facility]) has reviewed patient. We will continue to monitor patient advancement through interdisciplinary progression rounds. If new patient transition needs arise, please place a TOC consult. Rollator ordered through [facility] and will be delivered to the patients room.

## 2021-02-18 NOTE — Progress Notes (Signed)
VASCULAR LAB    Upper extremity vein mapping has been performed.  See CV proc for preliminary results.   Kendyl Bissonnette, RVT 02/18/2021, 3:31 PM

## 2021-02-18 NOTE — Progress Notes (Signed)
OT Cancellation Note  Patient Details Name: Jacob [name] MRN: [id number] DOB: May 21, 1945   Cancelled Treatment:    Reason Eval/Treat Not Completed: Patient at procedure or test/ unavailable (Pt at vascular lab; OT treat to f/u as appropriate.)  Tiawana Forgy A Breionna Punt 02/18/2021, 3:13 PM

## 2021-02-19 ENCOUNTER — Observation Stay (HOSPITAL_COMMUNITY): Payer: Medicare Other | Admitting: Anesthesiology

## 2021-02-19 ENCOUNTER — Encounter (HOSPITAL_COMMUNITY)
Admission: EM | Disposition: A | Discharge status: Home / Self Care | Payer: Self-pay | Source: Home / Self Care | Attending: Emergency Medicine

## 2021-02-19 ENCOUNTER — Encounter (HOSPITAL_COMMUNITY): Payer: Self-pay | Admitting: Internal Medicine

## 2021-02-19 ENCOUNTER — Other Ambulatory Visit: Payer: Self-pay

## 2021-02-19 DIAGNOSIS — N179 Acute kidney failure, unspecified: Secondary | ICD-10-CM | POA: Diagnosis not present

## 2021-02-19 DIAGNOSIS — N184 Chronic kidney disease, stage 4 (severe): Secondary | ICD-10-CM | POA: Diagnosis not present

## 2021-02-19 DIAGNOSIS — Z20822 Contact with and (suspected) exposure to covid-19: Secondary | ICD-10-CM | POA: Diagnosis not present

## 2021-02-19 DIAGNOSIS — I129 Hypertensive chronic kidney disease with stage 1 through stage 4 chronic kidney disease, or unspecified chronic kidney disease: Secondary | ICD-10-CM | POA: Diagnosis not present

## 2021-02-19 DIAGNOSIS — N185 Chronic kidney disease, stage 5: Secondary | ICD-10-CM | POA: Diagnosis not present

## 2021-02-19 DIAGNOSIS — N400 Enlarged prostate without lower urinary tract symptoms: Secondary | ICD-10-CM | POA: Diagnosis not present

## 2021-02-19 DIAGNOSIS — G9349 Other encephalopathy: Secondary | ICD-10-CM | POA: Diagnosis not present

## 2021-02-19 HISTORY — PX: AV FISTULA PLACEMENT: SHX1204

## 2021-02-19 LAB — BASIC METABOLIC PANEL
Anion gap: 9 (ref 5–15)
BUN: 75 mg/dL — ABNORMAL HIGH (ref 8–23)
CO2: 21 mmol/L — ABNORMAL LOW (ref 22–32)
Calcium: 9.4 mg/dL (ref 8.9–10.3)
Chloride: 104 mmol/L (ref 98–111)
Creatinine, Ser: 4.89 mg/dL — ABNORMAL HIGH (ref 0.61–1.24)
GFR, Estimated: 12 mL/min — ABNORMAL LOW (ref >60)
Glucose, Bld: 82 mg/dL (ref 70–99)
Potassium: 4.2 mmol/L (ref 3.5–5.1)
Sodium: 134 mmol/L — ABNORMAL LOW (ref 135–145)

## 2021-02-19 LAB — CBC
HCT: 26.2 % — ABNORMAL LOW (ref 39.0–52.0)
Hemoglobin: 8.6 g/dL — ABNORMAL LOW (ref 13.0–17.0)
MCH: 32.3 pg (ref 26.0–34.0)
MCHC: 32.8 g/dL (ref 30.0–36.0)
MCV: 98.5 fL (ref 80.0–100.0)
Platelets: 155 10*3/uL (ref 150–400)
RBC: 2.66 MIL/uL — ABNORMAL LOW (ref 4.22–5.81)
RDW: 13.2 % (ref 11.5–15.5)
WBC: 6.6 10*3/uL (ref 4.0–10.5)
nRBC: 0 % (ref 0.0–0.2)

## 2021-02-19 SURGERY — ARTERIOVENOUS (AV) FISTULA CREATION
Anesthesia: General | Site: Arm Upper | Laterality: Right

## 2021-02-19 MED ORDER — PROPOFOL 10 MG/ML IV BOLUS
INTRAVENOUS | Status: AC
  Filled 2021-02-19: qty 20

## 2021-02-19 MED ORDER — CEFAZOLIN SODIUM-DEXTROSE 2-3 GM-%(50ML) IV SOLR
INTRAVENOUS | Status: DC | PRN
  Administered 2021-02-19: 2 g via INTRAVENOUS

## 2021-02-19 MED ORDER — FENTANYL CITRATE (PF) 250 MCG/5ML IJ SOLN
INTRAMUSCULAR | Status: DC | PRN
  Administered 2021-02-19: 25 ug via INTRAVENOUS
  Administered 2021-02-19: 50 ug via INTRAVENOUS
  Administered 2021-02-19 (×2): 25 ug via INTRAVENOUS

## 2021-02-19 MED ORDER — OXYCODONE-ACETAMINOPHEN 7.5-325 MG PO TABS: 1.0000 | ORAL_TABLET | Freq: Three times a day (TID) | ORAL | 0 refills | Status: AC | PRN

## 2021-02-19 MED ORDER — PROPOFOL 10 MG/ML IV BOLUS
INTRAVENOUS | Status: DC | PRN
  Administered 2021-02-19: 40 mg via INTRAVENOUS
  Administered 2021-02-19: 120 mg via INTRAVENOUS

## 2021-02-19 MED ORDER — FENTANYL CITRATE (PF) 250 MCG/5ML IJ SOLN
INTRAMUSCULAR | Status: AC
  Filled 2021-02-19: qty 5

## 2021-02-19 MED ORDER — LACTATED RINGERS IV SOLN: INTRAVENOUS | Status: DC

## 2021-02-19 MED ORDER — CEFAZOLIN SODIUM 1 G IJ SOLR
INTRAMUSCULAR | Status: AC
  Filled 2021-02-19: qty 20

## 2021-02-19 MED ORDER — PROPOFOL 500 MG/50ML IV EMUL
INTRAVENOUS | Status: DC | PRN
  Administered 2021-02-19: 80 ug/kg/min via INTRAVENOUS

## 2021-02-19 MED ORDER — DEXAMETHASONE SODIUM PHOSPHATE 10 MG/ML IJ SOLN
INTRAMUSCULAR | Status: DC | PRN
  Administered 2021-02-19: 5 mg via INTRAVENOUS

## 2021-02-19 MED ORDER — ONDANSETRON HCL 4 MG/2ML IJ SOLN
INTRAMUSCULAR | Status: AC
  Filled 2021-02-19: qty 2

## 2021-02-19 MED ORDER — PROPOFOL 1000 MG/100ML IV EMUL
INTRAVENOUS | Status: AC
  Filled 2021-02-19: qty 100

## 2021-02-19 MED ORDER — CHLORHEXIDINE GLUCONATE 0.12 % MT SOLN: 15.0000 mL | Freq: Once | OROMUCOSAL | Status: AC

## 2021-02-19 MED ORDER — ONDANSETRON HCL 4 MG/2ML IJ SOLN
INTRAMUSCULAR | Status: DC | PRN
  Administered 2021-02-19: 4 mg via INTRAVENOUS

## 2021-02-19 MED ORDER — MIDAZOLAM HCL 2 MG/2ML IJ SOLN
INTRAMUSCULAR | Status: AC
  Filled 2021-02-19: qty 2

## 2021-02-19 MED ORDER — CHLORHEXIDINE GLUCONATE 0.12 % MT SOLN
OROMUCOSAL | Status: AC
  Administered 2021-02-19: 15 mL via OROMUCOSAL
  Filled 2021-02-19: qty 15

## 2021-02-19 MED ORDER — DEXAMETHASONE SODIUM PHOSPHATE 10 MG/ML IJ SOLN
INTRAMUSCULAR | Status: AC
  Filled 2021-02-19: qty 1

## 2021-02-19 MED ORDER — SODIUM CHLORIDE 0.9 % IV SOLN: INTRAVENOUS | Status: DC

## 2021-02-19 MED ORDER — PHENYLEPHRINE HCL-NACL 20-0.9 MG/250ML-% IV SOLN
INTRAVENOUS | Status: DC | PRN
  Administered 2021-02-19: 30 ug/min via INTRAVENOUS

## 2021-02-19 MED ORDER — HEPARIN 6000 UNIT IRRIGATION SOLUTION
Status: DC | PRN
  Administered 2021-02-19: 1

## 2021-02-19 MED ORDER — HEPARIN 6000 UNIT IRRIGATION SOLUTION
Status: AC
  Filled 2021-02-19: qty 500

## 2021-02-19 MED ORDER — LIDOCAINE HCL (PF) 1 % IJ SOLN
INTRAMUSCULAR | Status: AC
  Filled 2021-02-19: qty 30

## 2021-02-19 MED ORDER — HEPARIN SODIUM (PORCINE) 1000 UNIT/ML IJ SOLN
INTRAMUSCULAR | Status: DC | PRN
  Administered 2021-02-19: 2000 [IU] via INTRAVENOUS

## 2021-02-19 MED ORDER — LIDOCAINE 2% (20 MG/ML) 5 ML SYRINGE
INTRAMUSCULAR | Status: AC
  Filled 2021-02-19: qty 5

## 2021-02-19 MED ORDER — HEPARIN SODIUM (PORCINE) 1000 UNIT/ML IJ SOLN
INTRAMUSCULAR | Status: AC
  Filled 2021-02-19: qty 10

## 2021-02-19 MED ORDER — 0.9 % SODIUM CHLORIDE (POUR BTL) OPTIME
TOPICAL | Status: DC | PRN
Start: 2021-02-19 — End: 2021-02-19
  Administered 2021-02-19: 1000 mL

## 2021-02-19 MED ORDER — ORAL CARE MOUTH RINSE: 15.0000 mL | Freq: Once | OROMUCOSAL | Status: AC

## 2021-02-19 MED ORDER — HEMOSTATIC AGENTS (NO CHARGE) OPTIME
TOPICAL | Status: DC | PRN
Start: 2021-02-19 — End: 2021-02-19
  Administered 2021-02-19: 1 via TOPICAL

## 2021-02-19 MED ORDER — ACETAMINOPHEN 500 MG PO TABS
1000.0000 mg | ORAL_TABLET | Freq: Three times a day (TID) | ORAL | Status: DC | PRN
  Administered 2021-02-19: 1000 mg via ORAL
  Filled 2021-02-19: qty 2

## 2021-02-19 MED ORDER — LIDOCAINE 2% (20 MG/ML) 5 ML SYRINGE
INTRAMUSCULAR | Status: DC | PRN
  Administered 2021-02-19: 40 mg via INTRAVENOUS

## 2021-02-19 SURGICAL SUPPLY — 40 items
ADH SKN CLS APL DERMABOND .7 (GAUZE/BANDAGES/DRESSINGS) ×1
ARMBAND PINK RESTRICT EXTREMIT (MISCELLANEOUS) ×3 IMPLANT
BAG COUNTER SPONGE SURGICOUNT (BAG) ×2 IMPLANT
BAG SPNG CNTER NS LX DISP (BAG)
BLADE CLIPPER SURG (BLADE) ×3 IMPLANT
BNDG ELASTIC 6X5.8 VLCR STR LF (GAUZE/BANDAGES/DRESSINGS) ×1 IMPLANT
CANISTER SUCT 3000ML PPV (MISCELLANEOUS) ×3 IMPLANT
CLIP LIGATING EXTRA MED SLVR (CLIP) ×3 IMPLANT
CLIP LIGATING EXTRA SM BLUE (MISCELLANEOUS) ×3 IMPLANT
CLIP VESOCCLUDE MED 6/CT (CLIP) ×3 IMPLANT
COVER PROBE W GEL 5X96 (DRAPES) ×3 IMPLANT
DECANTER SPIKE VIAL GLASS SM (MISCELLANEOUS) ×3 IMPLANT
DERMABOND ADVANCED (GAUZE/BANDAGES/DRESSINGS) ×1
DERMABOND ADVANCED .7 DNX12 (GAUZE/BANDAGES/DRESSINGS) ×2 IMPLANT
ELECT REM PT RETURN 9FT ADLT (ELECTROSURGICAL) ×2
ELECTRODE REM PT RTRN 9FT ADLT (ELECTROSURGICAL) ×2 IMPLANT
GAUZE 4X4 16PLY ~~LOC~~+RFID DBL (SPONGE) ×1 IMPLANT
GLOVE SRG 8 PF TXTR STRL LF DI (GLOVE) ×4 IMPLANT
GLOVE SURG POLYISO LF SZ8 (GLOVE) IMPLANT
GLOVE SURG UNDER POLY LF SZ8 (GLOVE) ×4
GOWN STRL REUS W/ TWL LRG LVL3 (GOWN DISPOSABLE) ×4 IMPLANT
GOWN STRL REUS W/TWL 2XL LVL3 (GOWN DISPOSABLE) ×3 IMPLANT
GOWN STRL REUS W/TWL LRG LVL3 (GOWN DISPOSABLE) ×4
HEMOSTAT SNOW SURGICEL 2X4 (HEMOSTASIS) ×1 IMPLANT
KIT BASIN OR (CUSTOM PROCEDURE TRAY) ×3 IMPLANT
KIT TURNOVER KIT B (KITS) ×3 IMPLANT
NS IRRIG 1000ML POUR BTL (IV SOLUTION) ×3 IMPLANT
PACK CV ACCESS (CUSTOM PROCEDURE TRAY) ×3 IMPLANT
PAD ARMBOARD 7.5X6 YLW CONV (MISCELLANEOUS) ×6 IMPLANT
SPONGE T-LAP 18X18 ~~LOC~~+RFID (SPONGE) ×1 IMPLANT
SUT MNCRL AB 4-0 PS2 18 (SUTURE) ×3 IMPLANT
SUT PROLENE 6 0 BV (SUTURE) ×3 IMPLANT
SUT PROLENE 7 0 BV 1 (SUTURE) IMPLANT
SUT SILK 2 0 SH (SUTURE) ×3 IMPLANT
SUT SILK 3 0 SH CR/8 (SUTURE) ×3 IMPLANT
SUT VIC AB 3-0 SH 27 (SUTURE) ×2
SUT VIC AB 3-0 SH 27X BRD (SUTURE) ×2 IMPLANT
TOWEL GREEN STERILE (TOWEL DISPOSABLE) ×3 IMPLANT
UNDERPAD 30X36 HEAVY ABSORB (UNDERPADS AND DIAPERS) ×3 IMPLANT
WATER STERILE IRR 1000ML POUR (IV SOLUTION) ×3 IMPLANT

## 2021-02-19 NOTE — Anesthesia Procedure Notes (Signed)
Procedure Name: LMA Insertion Date/Time: 02/19/2021 10:53 AM Performed by: Lorie Phenix, CRNA Pre-anesthesia Checklist: Patient identified, Emergency Drugs available, Suction available and Patient being monitored Patient Re-evaluated:Patient Re-evaluated prior to induction Oxygen Delivery Method: Circle System Utilized Preoxygenation: Pre-oxygenation with 100% oxygen Induction Type: IV induction Ventilation: Mask ventilation without difficulty LMA: LMA inserted LMA Size: 4.0 Number of attempts: 1 Placement Confirmation: positive ETCO2 Tube secured with: Tape Dental Injury: Teeth and Oropharynx as per pre-operative assessment

## 2021-02-19 NOTE — Anesthesia Preprocedure Evaluation (Addendum)
Anesthesia Evaluation  Patient identified by MRN, date of birth, ID band Patient awake    Reviewed: Allergy & Precautions, NPO status , Patient's Chart, lab work & pertinent test results  History of Anesthesia Complications (+) PONV and history of anesthetic complications  Airway Mallampati: I  TM Distance: >3 FB Neck ROM: Full    Dental  (+) Edentulous Lower, Edentulous Upper   Pulmonary sleep apnea , former smoker,    breath sounds clear to auscultation       Cardiovascular hypertension,  Rhythm:Regular Rate:Normal     Neuro/Psych PSYCHIATRIC DISORDERS Anxiety Depression CVA    GI/Hepatic Neg liver ROS, PUD, GERD  ,  Endo/Other    Renal/GU CRFRenal disease     Musculoskeletal  (+) Arthritis ,   Abdominal Normal abdominal exam  (+)   Peds  Hematology   Anesthesia Other Findings   Reproductive/Obstetrics                            Anesthesia Physical Anesthesia Plan  ASA: 3  Anesthesia Plan: General   Post-op Pain Management:    Induction: Intravenous  PONV Risk Score and Plan: 4 or greater and Ondansetron, Dexamethasone and Treatment may vary due to age or medical condition  Airway Management Planned:   Additional Equipment: None  Intra-op Plan:   Post-operative Plan: Extubation in OR  Informed Consent: I have reviewed the patients History and Physical, chart, labs and discussed the procedure including the risks, benefits and alternatives for the proposed anesthesia with the patient or authorized representative who has indicated his/her understanding and acceptance.       Plan Discussed with: CRNA  Anesthesia Plan Comments: (Pt declines nerve block, prefers GA. )       Anesthesia Quick Evaluation

## 2021-02-19 NOTE — Progress Notes (Addendum)
[facility] Progress Note  Name: Jacob [name] MRN: [id number] DOB: [birth year]   Subjective:  S/p right arm first stage brachiobasilic AVF with vascular earlier today.  he had 250 L UOP over 2/6 though suspect not all output documented. Foley was removed and team documented post-void of 100 mL  Review of systems: Denies shortness of breath or chest pain  Denies n/v  -------------------- Background on consult:  Jacob [name] is a/an 76 y.o. male with a past medical history of HTN, CKD, obesity, gastric cancer who presents with dysarthria and left arm issues. Patient states that has been feeling fairly well but yesterday started having an issue using his left arm described his weakness.  Also felt like he was possibly more confused and having a hard time talking.  Patient admits to having some possible dementia at baseline.  He was not having any fevers, chills, shortness of breath, chest pain.  Does have some dyspnea on exertion which is unchanged over the past year.  No nausea or vomiting.  Denies any NSAID use.  In the emergency department neurology was consulted and did not feel like he was having a stroke.  Creatinine was 5.9.  He received some IV fluids in the ER.  Overnight he was found to have urinary retention with greater than 700 cc of urine on bladder scan.  Foley catheter was placed.  Patient states he feels a lot better today.  Arm is easier to use.  Feels like he is coherent and understands was going on.  Was recently seen in our office by Dr. Joelyn [name].  Creatinine was around 5 in the outpatient setting and there were plans to get him set up with dialysis nonurgently.   Intake/Output Summary (Last 24 hours) at 02/19/2021 1318 Last data filed at 02/19/2021 1154 Gross per 24 hour  Intake 641.16 ml  Output 260 ml  Net 381.16 ml    Vitals:  Vitals:   02/19/21 0836 02/19/21 1210 02/19/21 1225 02/19/21 1240  BP: 138/88 (!) 98/52 115/61 (!) 120/57  Pulse: 80  79 79 78  Resp: 19 16 13 14   Temp: 98.2 F (36.8 C) 98.7 F (37.1 C)  98.7 F (37.1 C)  TempSrc: Oral     SpO2: 97% 99% 99% 100%  Weight:      Height:         Physical Exam:   General adult male in bed in no acute distress HEENT normocephalic atraumatic extraocular movements intact sclera anicteric Neck supple trachea midline Lungs clear to auscultation bilaterally normal work of breathing at rest on room air Heart S1S2 no rub Abdomen soft nontender nondistended Extremities no edema  Psych normal mood and affect Neuro - alert and oriented x3 provides basic hx and follows commands   Medications reviewed   Labs:  BMP Latest Ref Rng & Units 02/19/2021 02/18/2021 02/17/2021  Glucose 70 - 99 mg/dL 82 78 103(H)  BUN 8 - 23 mg/dL 75(H) 78(H) 72(H)  Creatinine 0.61 - 1.24 mg/dL 4.89(H) 5.19(H) 4.97(H)  Sodium 135 - 145 mmol/L 134(L) 136 139  Potassium 3.5 - 5.1 mmol/L 4.2 4.5 4.0  Chloride 98 - 111 mmol/L 104 104 106  CO2 22 - 32 mmol/L 21(L) 22 23  Calcium 8.9 - 10.3 mg/dL 9.4 9.2 9.3     Assessment/Plan:   Jacob [name] is a/an 76 y.o. male with a past medical history HTN, CKD, gastric cancer who present w/ left arm weakness and AKI on CKD    Non-Oliguric AKI  on CKD IV/V: Hard to call definitive baseline.  Creatinine has fluctuated in the outpatient setting.  Previously was around 3.5 but more recently in our office creatinine was around 5.  Creatinine now slightly above baseline possibly related to dehydration but also urinary retention.  He does not seem uremic to me at this time.  s/p right arm first stage brachiobasilic AVF today with vascular - Currently no indication for HD - monitor for retention - foley was removed   Dysarthria/left arm weakness: Not felt to be a stroke by neurology.  More concern for uremia.  Could have been the case but patient appears much better at this time.    Gout: Patient is on allopurinol   History of gastric cancer: Undergoing  surveillance.   History of prostate cancer/BPH: Status post radiation remotely around 2013.  On flomax. Note foley removed. Monitor for retention and would follow-up with urology    Hypertension: controlled  Anemia CKD -  no emergent need for PRBC's; undergoing surveillance with hx gastric cancer. Updated iron panel in process  Disposition - per primary team. He is not sure and thought possibly today.  I will ensure he has follow-up with our office in two weeks    Jacob Desanctis, MD 02/19/2021 1:49 PM

## 2021-02-19 NOTE — Progress Notes (Signed)
PT Cancellation Note  Patient Details Name: Tevan Marian MRN: [id number] DOB: 04-09-1945   Cancelled Treatment:    Reason Eval/Treat Not Completed: Patient at procedure or test/unavailable will check back as able.  Stacie Glaze, PT DPT Acute Rehabilitation Services Pager [phone]  Office [phone]    Louis Matte 02/19/2021, 8:57 AM

## 2021-02-19 NOTE — Discharge Summary (Addendum)
Name: Jacob [name] MRN: [id number] DOB: 12/12/1945 76 y.o. PCP: [name], MD  Date of Admission: 02/16/2021  1:37 PM Date of Discharge:  02/19/21 Attending Physician: Dr. 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   allopurinol 300 MG tablet Commonly known as: ZYLOPRIM Take 150 mg by mouth daily.   aspirin EC 81 MG tablet Take 1 tablet (81 mg total) by mouth daily. Swallow whole.   buPROPion 300 MG 24 hr tablet Commonly known as: WELLBUTRIN XL Take 300 mg by mouth daily.   calcium carbonate 500 MG chewable tablet Commonly known as: TUMS - dosed in mg elemental calcium Chew 3 tablets by mouth 2 (two) times daily as needed for indigestion or heartburn.   diclofenac Sodium 1 % Gel Commonly known as: VOLTAREN Apply 2 g topically 4 (four) times daily.   donepezil 5 MG tablet Commonly known as: Aricept Take 1 tablet (5 mg total) by mouth at bedtime.   furosemide 20 MG tablet Commonly known as: LASIX Take 20 mg by mouth daily.   hydrALAZINE 50 MG tablet Commonly known as: APRESOLINE Take 50 mg by mouth 3 (three) times daily. What changed: Another medication with the same name was removed. Continue taking this medication, and follow the directions you see here.   lidocaine 5 % ointment Commonly known as: XYLOCAINE Apply 1 application topically as needed.   Lokelma 10 g Pack packet Generic drug: sodium zirconium cyclosilicate Take 10 g by mouth daily.   melatonin 3  MG Tabs tablet Take 9 mg by mouth at bedtime.   Nebivolol HCl 20 MG Tabs Take 20 mg by mouth daily.   oxyCODONE-acetaminophen 7.5-325 MG tablet Commonly known as: Percocet Take 1 tablet by mouth every 8 (eight) hours as needed for up to 3 days for severe pain.   pantoprazole 40 MG tablet Commonly known as: PROTONIX Take 1 tablet (40 mg total) by mouth daily before supper.   rosuvastatin 5 MG tablet Commonly known as: CRESTOR Take 5 mg by mouth daily. Patient states he takes 15 MG daily   sodium bicarbonate 650 MG tablet Take 650 mg by mouth 2 (two) times daily.   tamsulosin 0.4 MG Caps capsule Commonly known as: FLOMAX Take 1 capsule by mouth every day   verapamil 80 MG tablet Commonly known as: CALAN Take 80 mg by mouth 3 (three) times daily.               Durable Medical Equipment  (From admission, onward)           Start     Ordered   02/18/21 1313  For home use only DME 4 wheeled rolling walker with seat  Once       Question:  Patient needs a walker to treat with the following condition  Answer:  Fall   02/18/21 1313   02/18/21 1000  For home use only DME 4 wheeled rolling  walker with seat  Once       Question:  Patient needs a walker to treat with the following condition  Answer:  Stroke-like episode   02/18/21 1000            DISPOSITION AND FOLLOW-UP:  Mr.Jacob [name] was discharged from [facility] in stable condition. At the hospital follow up visit please address:  Follow-up Recommendations: Consults: Vascular surgery (VVS), and nephrology  Labs: none  Studies: none  Medications: none   Follow-up Appointments:  Follow-up Information     [name], MD. Schedule an appointment as soon as possible for a visit in 1 week(s).   Specialty: Family Medicine Why: schedule an appointment for in 1 week Contact information: [place] [address] [phone]         Berniece Salines, DO .    Specialty: Cardiology Contact information: [address] [place] [address] [phone]         [place], [place]. Call in 1 week(s).   Why: Please call your kidney doctor to schedule a follow up appt to talk more about starting dialysis Contact information: [place] [address] [phone]                 HOSPITAL COURSE:  Patient Summary: Left arm weakness and dysarthria  Possible uremic encephalopathy in s/o AKI on CKD stage 4  Urinary retention  76 year old patient with history of hypertension, CKD stage IV, obesity, and gout presenting 2/4 for dysarthria and left arm weakness and shaking.  Patient noted onset of the symptoms earlier in the morning on day of presentation.  Patient also noted he felt more confused than normal.  Given initial presentation there was concern for stroke and neurology was consulted.  CT head and MRI negative for any acute process.  No significant intracranial stenosis, bilateral ICAs and vertebral arteries without significant stenosis.  Lab work notable for stable macrocytic anemia, increased creatinine to 5.58 and BUN 78.  Baseline creatinine around 2.7-3, BUN 40s.  Given negative stroke evaluation and significant decline in kidney function with uremia, concern for uremic encephalopathy as etiology of patient's presentation.  Neurology did note asterixis on initial examination.  Patient does follow with nephrology and notes that he has been told he may need to start dialysis soon.  Does continue to produce urine.  Treated with 1.5 L IV fluids overnight.  Noted to be retaining urine overnight with placement of Foley catheter.  1200 cc out.  On evaluation this morning Mr. Strebeck reports his speech is back to normal and does not sound dysarthric.  No further left arm weakness or shaking per patient report, none seen on exam.  No asterixis noted.  No evidence of volume overload.  Creatinine 4.97 BUN 72 this morning.   Electrolytes within normal limits.  Hemoglobin stable. Although uremic encephalopathy suspected in s/o AKI on CKD4, not determined to be uremic on nephrology eval. Difficult to assess Cr baseline d/t fluctuations (last Cr 5 in nephro office visit), but bump in Cr this admission possibly d/t dehydration and also urinary retention, given found to be retaining 700cc overnight requiring foley cath, and improvement in lab values s/p total 1.5L IVF. No urgent dialysis indicated by nephrology; continue outpatient f/u to initiate nonurgent dialysis    - No urgent dialysis indicated by nephrology, but VVS was consulted to place Fistula to expedite the process so that he can start HD soon. He is to  follow up with nephrology as an outpatient to continue with the process.  - IVF initially  - Foley catheter during admission for retention, and removed after a voiding trial - Trend BMP - Strict I/Os and daily weight - Avoid nephrotoxins  - PT/OT       HTN   Unsure which medication he is taking. Daughter was able to provide some insight; she believes he is taking Verapamil 90 tid and Hydralazine 90 tid, but will be able to provide more info tmrw.  - Given near-normal BP, will start Hydral 50 tid at this time  Hx of gastric cancer  S/p partial gastrectomy, undergoing active surveillance with oncology. Has a 5 mm upper lobe pulmonary nodule that is thought to be unlikely metastatic disease, but plan for repeat imaging in the OP setting with Dr Alen Blew.  -Continue outpatient follow up with oncology    Hx of prostate cancer  BPH He is status post radiation therapy for definitive treatment for his prostate cancer (2013) - Continue Flomax     DISCHARGE INSTRUCTIONS:   Discharge Instructions     Call MD for:  difficulty breathing, headache or visual disturbances   Complete by: As directed    Call MD for:  extreme fatigue   Complete by: As directed    Call MD for:  persistant dizziness or light-headedness    Complete by: As directed    Call MD for:  persistant nausea and vomiting   Complete by: As directed    Call MD for:  redness, tenderness, or signs of infection (pain, swelling, redness, odor or green/yellow discharge around incision site)   Complete by: As directed    Call MD for:  severe uncontrolled pain   Complete by: As directed    Call MD for:  temperature >100.4   Complete by: As directed    Diet - low sodium heart healthy   Complete by: As directed    Increase activity slowly   Complete by: As directed        SUBJECTIVE:  No acute overnight events.    Patient was seen at bedside after his fistula procedure. Pt reports feeling well reports resolution of his symptoms. He reports not being in significant pain at this time. He is hungry and requesting food.    Pt is updated on the plan for today, and all questions and concerns are addressed.   Discharge Vitals:   BP (!) 148/85 (BP Location: Left Arm)    Pulse 91    Temp (!) 97.5 F (36.4 C) (Oral)    Resp 20    Ht 6\' 2"  (1.88 m)    Wt 113.4 kg    SpO2 100%    BMI 32.10 kg/m   OBJECTIVE:  Constitutional: alert, well-appearing, in NAD  Eyes: anicteric sclera Cardiovascular: RRR, no m/r/g, non-edematous bilateral LE Pulmonary/Chest: normal work of breathing on room air, LCTAB Abdominal: soft, non-tender to palpation, non-distended Extremities: good strength, right arm wrapped s/p fistula creation  Neurological: A&O x 3, follows commands. Normal speech, no gross focal deficits.   Pertinent Labs, Studies, and Procedures:  CBC Latest Ref Rng & Units 02/19/2021 02/18/2021 02/17/2021  WBC 4.0 - 10.5 K/uL 6.6 7.8 5.6  Hemoglobin 13.0 - 17.0 g/dL 8.6(L) 8.6(L) 9.0(L)  Hematocrit 39.0 - 52.0 % 26.2(L) 26.8(L) 27.6(L)  Platelets 150 - 400 K/uL 155 161 152    CMP Latest Ref Rng & Units 02/19/2021 02/18/2021 02/17/2021  Glucose 70 - 99 mg/dL 82 78 103(H)  BUN 8 - 23 mg/dL 75(H) 78(H) 72(H)  Creatinine 0.61 - 1.24 mg/dL 4.89(H) 5.19(H)  4.97(H)  Sodium 135 - 145 mmol/L 134(L) 136 139  Potassium 3.5 - 5.1 mmol/L 4.2 4.5 4.0  Chloride 98 - 111 mmol/L 104 104 106  CO2 22 - 32 mmol/L 21(L) 22 23  Calcium 8.9 - 10.3 mg/dL 9.4 9.2 9.3  Total Protein 6.5 - 8.1 g/dL - - -  Total Bilirubin 0.3 - 1.2 mg/dL - - -  Alkaline Phos 38 - 126 U/L - - -  AST 15 - 41 U/L - - -  ALT 0 - 44 U/L - - -    CT HEAD WO CONTRAST  Result Date: 02/16/2021 CLINICAL DATA:  Acute neuro deficit, stroke suspected. EXAM: CT HEAD WITHOUT CONTRAST TECHNIQUE: Contiguous axial images were obtained from the base of the skull through the vertex without intravenous contrast. RADIATION DOSE REDUCTION: This exam was performed according to the departmental dose-optimization program which includes automated exposure control, adjustment of the mA and/or kV according to patient size and/or use of iterative reconstruction technique. COMPARISON:  Head CT dated 05/11/2020. FINDINGS: Brain: Generalized age related parenchymal volume loss with commensurate dilatation of the ventricles and sulci. Ventricles are stable in size and configuration. Mild chronic small vessel ischemic changes within the bilateral periventricular white matter regions. No mass, hemorrhage, edema or other evidence of acute parenchymal abnormality. No extra-axial hemorrhage. Vascular: Chronic calcified atherosclerotic changes of the large vessels at the skull base. No unexpected hyperdense vessel. Skull: Normal. Negative for fracture or focal lesion. Sinuses/Orbits: No acute finding. Other: None. IMPRESSION: 1. No acute findings. No intracranial mass, hemorrhage or edema. 2. Chronic small vessel ischemic changes in the white matter. Electronically Signed   By: Franki Cabot M.D.   On: 02/16/2021 14:38   MR ANGIO HEAD WO CONTRAST  Result Date: 02/16/2021 CLINICAL DATA:  Difficulty speaking and using left arm stroke/TIA suspected, determine embolic source EXAM: MRI HEAD WITHOUT CONTRAST MRA HEAD WITHOUT CONTRAST  MRA NECK WITHOUT CONTRAST TECHNIQUE: Multiplanar, multi-echo pulse sequences of the brain and surrounding structures were acquired without intravenous contrast. Angiographic images of the Circle of Willis were acquired using MRA technique without intravenous contrast. Angiographic images of the neck were acquired using MRA technique without intravenous contrast. Carotid stenosis measurements (when applicable) are obtained utilizing NASCET criteria, using the distal internal carotid diameter as the denominator. COMPARISON:  MRI head 05/11/2020, correlation is also made with same day CT head 02/16/2021 FINDINGS: MRI HEAD FINDINGS Brain: No restricted diffusion to suggest acute or subacute infarct. No acute hemorrhage, mass, mass effect, or midline shift. No hydrocephalus or extra-axial collection. T2 hyperintense signal in the periventricular white matter and pons, likely the sequela of moderate chronic small vessel ischemic disease. Degree of cerebral atrophy is likely within normal limits for age. 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 Vascular: Please see MRA findings below. Skull and upper cervical spine: Normal marrow signal. Sinuses/Orbits: Air-fluid level in the left maxillary sinus. Mucosal thickening in the sphenoid sinuses and, to a lesser extent, ethmoid air cells. The orbits are unremarkable. Other: Fluid in the right mastoid air cells. MRA HEAD FINDINGS Anterior circulation: Both internal carotid arteries are patent to the termini, with moderate narrowing of the left distal petrous and proximal cavernous left ICA (series 2, image 113), likely secondary to atherosclerotic plaque. A1 segments patent. Normal anterior communicating artery. Anterior cerebral arteries are patent to their distal aspects. No M1 stenosis or occlusion. Normal MCA  bifurcations. Distal MCA branches perfused and symmetric. Posterior circulation: Vertebral arteries patent to the vertebrobasilar junction without  stenosis. Basilar patent to its distal aspect. Superior cerebellar arteries patent bilaterally. Patent P1 segments. PCAs perfused to their distal aspects without stenosis. The bilateral posterior communicating arteries are visualized. Anatomic variants: None significant MRA NECK FINDINGS Aortic arch: Limited visualization. Three-vessel arch without evidence of dissection or definite aneurysm. Right carotid system: No evidence of dissection, occlusion or hemodynamically significant stenosis (greater than 50%). Likely calcified plaque at the bifurcation and in the proximal (series 4, image 71) and mid (series 4, image 41) right ICA causes less than 50% luminal narrowing. Left carotid system: No evidence of dissection, occlusion or hemodynamically significant stenosis (greater than 50%). Plaque in the proximal (series 4, image 73) and distal (series 4, image 38) left ICA causes less than 50% luminal narrowing. Vertebral arteries: The origins of the vertebral arteries are not well evaluated secondary to artifact. The bilateral V2 and V3 segments are patent, with multifocal irregularity primarily in the V2 segments. No evidence of dissection, occlusion or hemodynamically significant stenosis (greater than 50%). Other: None IMPRESSION: 1.  No acute intracranial process. 2. Moderate narrowing of the distal left petrous and proximal left cavernous ICA. No intracranial large vessel occlusion or other significant stenosis. 3. No hemodynamically significant stenosis in the neck. Narrowing in the bilateral ICAs and vertebral arteries causes less than 50% luminal narrowing. 4. Air-fluid level in the left maxillary sinus, as can be seen in the setting of acute sinusitis. Correlate with symptoms. Electronically Signed   By: Merilyn Baba M.D.   On: 02/16/2021 19:42   MR ANGIO NECK WO CONTRAST  Result Date: 02/16/2021 CLINICAL DATA:  Difficulty speaking and using left arm stroke/TIA suspected, determine embolic source EXAM: MRI  HEAD WITHOUT CONTRAST MRA HEAD WITHOUT CONTRAST MRA NECK WITHOUT CONTRAST TECHNIQUE: Multiplanar, multi-echo pulse sequences of the brain and surrounding structures were acquired without intravenous contrast. Angiographic images of the Circle of Willis were acquired using MRA technique without intravenous contrast. Angiographic images of the neck were acquired using MRA technique without intravenous contrast. Carotid stenosis measurements (when applicable) are obtained utilizing NASCET criteria, using the distal internal carotid diameter as the denominator. COMPARISON:  MRI head 05/11/2020, correlation is also made with same day CT head 02/16/2021 FINDINGS: MRI HEAD FINDINGS Brain: No restricted diffusion to suggest acute or subacute infarct. No acute hemorrhage, mass, mass effect, or midline shift. No hydrocephalus or extra-axial collection. T2 hyperintense signal in the periventricular white matter and pons, likely the sequela of moderate chronic small vessel ischemic disease. Degree of cerebral atrophy is likely within normal limits for age. 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 Vascular: Please see MRA findings below. Skull and upper cervical spine: Normal marrow signal. Sinuses/Orbits: Air-fluid level in the left maxillary sinus. Mucosal thickening in the sphenoid sinuses and, to a lesser extent, ethmoid air cells. The orbits are unremarkable. Other: Fluid in the right mastoid air cells. MRA HEAD FINDINGS Anterior circulation: Both internal carotid arteries are patent to the termini, with moderate narrowing of the left distal petrous and proximal cavernous left ICA (series 2, image 113), likely secondary to atherosclerotic plaque. A1 segments patent. Normal anterior communicating artery. Anterior cerebral arteries are patent to their distal aspects. No M1 stenosis or occlusion. Normal MCA bifurcations. Distal MCA branches perfused and symmetric. Posterior circulation: Vertebral arteries patent  to the vertebrobasilar junction without stenosis. Basilar patent to its distal aspect. Superior cerebellar arteries patent bilaterally.  Patent P1 segments. PCAs perfused to their distal aspects without stenosis. The bilateral posterior communicating arteries are visualized. Anatomic variants: None significant MRA NECK FINDINGS Aortic arch: Limited visualization. Three-vessel arch without evidence of dissection or definite aneurysm. Right carotid system: No evidence of dissection, occlusion or hemodynamically significant stenosis (greater than 50%). Likely calcified plaque at the bifurcation and in the proximal (series 4, image 71) and mid (series 4, image 41) right ICA causes less than 50% luminal narrowing. Left carotid system: No evidence of dissection, occlusion or hemodynamically significant stenosis (greater than 50%). Plaque in the proximal (series 4, image 73) and distal (series 4, image 38) left ICA causes less than 50% luminal narrowing. Vertebral arteries: The origins of the vertebral arteries are not well evaluated secondary to artifact. The bilateral V2 and V3 segments are patent, with multifocal irregularity primarily in the V2 segments. No evidence of dissection, occlusion or hemodynamically significant stenosis (greater than 50%). Other: None IMPRESSION: 1.  No acute intracranial process. 2. Moderate narrowing of the distal left petrous and proximal left cavernous ICA. No intracranial large vessel occlusion or other significant stenosis. 3. No hemodynamically significant stenosis in the neck. Narrowing in the bilateral ICAs and vertebral arteries causes less than 50% luminal narrowing. 4. Air-fluid level in the left maxillary sinus, as can be seen in the setting of acute sinusitis. Correlate with symptoms. Electronically Signed   By: Merilyn Baba M.D.   On: 02/16/2021 19:42   MR BRAIN WO CONTRAST  Result Date: 02/16/2021 CLINICAL DATA:  Difficulty speaking and using left arm stroke/TIA suspected,  determine embolic source EXAM: MRI HEAD WITHOUT CONTRAST MRA HEAD WITHOUT CONTRAST MRA NECK WITHOUT CONTRAST TECHNIQUE: Multiplanar, multi-echo pulse sequences of the brain and surrounding structures were acquired without intravenous contrast. Angiographic images of the Circle of Willis were acquired using MRA technique without intravenous contrast. Angiographic images of the neck were acquired using MRA technique without intravenous contrast. Carotid stenosis measurements (when applicable) are obtained utilizing NASCET criteria, using the distal internal carotid diameter as the denominator. COMPARISON:  MRI head 05/11/2020, correlation is also made with same day CT head 02/16/2021 FINDINGS: MRI HEAD FINDINGS Brain: No restricted diffusion to suggest acute or subacute infarct. No acute hemorrhage, mass, mass effect, or midline shift. No hydrocephalus or extra-axial collection. T2 hyperintense signal in the periventricular white matter and pons, likely the sequela of moderate chronic small vessel ischemic disease. Degree of cerebral atrophy is likely within normal limits for age. 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 Vascular: Please see MRA findings below. Skull and upper cervical spine: Normal marrow signal. Sinuses/Orbits: Air-fluid level in the left maxillary sinus. Mucosal thickening in the sphenoid sinuses and, to a lesser extent, ethmoid air cells. The orbits are unremarkable. Other: Fluid in the right mastoid air cells. MRA HEAD FINDINGS Anterior circulation: Both internal carotid arteries are patent to the termini, with moderate narrowing of the left distal petrous and proximal cavernous left ICA (series 2, image 113), likely secondary to atherosclerotic plaque. A1 segments patent. Normal anterior communicating artery. Anterior cerebral arteries are patent to their distal aspects. No M1 stenosis or occlusion. Normal MCA bifurcations. Distal MCA branches perfused and symmetric. Posterior  circulation: Vertebral arteries patent to the vertebrobasilar junction without stenosis. Basilar patent to its distal aspect. Superior cerebellar arteries patent bilaterally. Patent P1 segments. PCAs perfused to their distal aspects without stenosis. The bilateral posterior communicating arteries are visualized. Anatomic variants: None significant MRA NECK FINDINGS Aortic arch: Limited visualization. Three-vessel arch  without evidence of dissection or definite aneurysm. Right carotid system: No evidence of dissection, occlusion or hemodynamically significant stenosis (greater than 50%). Likely calcified plaque at the bifurcation and in the proximal (series 4, image 71) and mid (series 4, image 41) right ICA causes less than 50% luminal narrowing. Left carotid system: No evidence of dissection, occlusion or hemodynamically significant stenosis (greater than 50%). Plaque in the proximal (series 4, image 73) and distal (series 4, image 38) left ICA causes less than 50% luminal narrowing. Vertebral arteries: The origins of the vertebral arteries are not well evaluated secondary to artifact. The bilateral V2 and V3 segments are patent, with multifocal irregularity primarily in the V2 segments. No evidence of dissection, occlusion or hemodynamically significant stenosis (greater than 50%). Other: None IMPRESSION: 1.  No acute intracranial process. 2. Moderate narrowing of the distal left petrous and proximal left cavernous ICA. No intracranial large vessel occlusion or other significant stenosis. 3. No hemodynamically significant stenosis in the neck. Narrowing in the bilateral ICAs and vertebral arteries causes less than 50% luminal narrowing. 4. Air-fluid level in the left maxillary sinus, as can be seen in the setting of acute sinusitis. Correlate with symptoms. Electronically Signed   By: Merilyn Baba M.D.   On: 02/16/2021 19:42   US RENAL  Result Date: 02/16/2021 CLINICAL DATA:  AKI. EXAM: RENAL / URINARY TRACT  ULTRASOUND COMPLETE COMPARISON:  12/24/2020. FINDINGS: Right Kidney: Renal measurements: 15.4 x 6.6 x 5.3 cm = volume: 280.90 mL. Mildly increased echogenicity. There is a simple cyst in the upper pole measuring 7.1 x 5.8 x 7.0 cm. There is an anechoic structure in the midpole with rim calcification measuring 2.2 x 1.7 x 2.4 cm. No mass or hydronephrosis visualized. Left Kidney: Renal measurements: 11.6 x 6.3 x 5.6 cm = volume: 215.84 mL. Mildly increased echogenicity. A simple cyst is present in the mid to lower pole measuring 1.1 x 0.9 x 1.0 cm. There is a simple cyst in the midpole measuring 0.9 x 0.9 x 1.0 cm. No mass or hydronephrosis visualized. Bladder: Appears normal for degree of bladder distention. Other: None. IMPRESSION: 1. Mildly increased echogenicity of the kidneys suggesting medical renal disease. 2. Bilateral renal cysts. There is minimally complex cyst in the mid right kidney with a thin rim of calcification. Electronically Signed   By: Brett Fairy M.D.   On: 02/16/2021 20:03      Lajean Manes, MD Internal Medicine Resident, PGY-1 Pager: [phone]

## 2021-02-19 NOTE — Progress Notes (Signed)
Consent verified with Dr. Virl Cagey, RIGHT side is correct, consent changed to reflect.

## 2021-02-19 NOTE — Progress Notes (Signed)
Physical Therapy Treatment Patient Details Name: Jacob [name] MRN: [id number] DOB: October 03, 1945 Today's Date: 02/19/2021   History of Present Illness Jacob [name] is a 76 y.o. male with a pertinent PMH HTN, CKD stage 4, obesity, history of gastric cancer, and gout presenting to the ED for dysarthria and difficulty using left arm since 6 am this morning 2/4. imaging didn't reveal any acute lesions. L UE weakness and difficulty speaking has resided. Pt was found to have poor kidney function.    PT Comments    Pt. Tolerated ambulation in room and hallway house hold distances well, requiring one seated rest break due to fatigue during the session. He was also able to donn pants with supervision with no LOB. He still has slow processing during commands but is able to navigate on the floor and respond to all commands/questions appropriately. Plan to transition to home with supervision. PT to follow up acutely as able.     Recommendations for follow up therapy are one component of a multi-disciplinary discharge planning process, led by the attending physician.  Recommendations may be updated based on patient status, additional functional criteria and insurance authorization.  Follow Up Recommendations  No PT follow up     Assistance Recommended at Discharge Intermittent Supervision/Assistance  Patient can return home with the following Assistance with cooking/housework;Assist for transportation;Help with stairs or ramp for entrance   Equipment Recommendations  Rollator (4 wheels)    Recommendations for Other Services       Precautions / Restrictions Precautions Precautions: Fall     Mobility  Bed Mobility Overal bed mobility: Modified Independent                  Transfers       Sit to Stand: Supervision           General transfer comment: Pt. sitting EOB at PT arrival    Ambulation/Gait Ambulation/Gait assistance: Min guard Gait Distance (Feet): 175  Feet Assistive device: Rolling walker (2 wheels) Gait Pattern/deviations: Step-through pattern, Decreased stride length, Trunk flexed Gait velocity: dec     General Gait Details: More normalized pace today, cues for proper RW management, pt. reports using a RW sometimes at home but that he can walk farther with one.   Stairs             Wheelchair Mobility    Modified Rankin (Stroke Patients Only)       Balance Overall balance assessment: Needs assistance Sitting-balance support: Feet supported Sitting balance-Leahy Scale: Good Sitting balance - Comments: Pt. able to donn pants with supervison   Standing balance support: During functional activity, No upper extremity supported Standing balance-Leahy Scale: Fair Standing balance comment: Able to stand w/o RW for moderate amounts of time but requires UE support during any balance challenges or gait                            Cognition Arousal/Alertness: Awake/alert Behavior During Therapy: WFL for tasks assessed/performed, Flat affect Overall Cognitive Status: Within Functional Limits for tasks assessed                                 General Comments: Pt. required increased time for cognitive processing but was able to respond appropriately to all questions. Pt. was able to navigate back to room during walk on floor and remember room number.  Exercises      General Comments        Pertinent Vitals/Pain Pain Assessment Pain Assessment: No/denies pain    Home Living                          Prior Function            PT Goals (current goals can now be found in the care plan section) Acute Rehab PT Goals Patient Stated Goal: didn't state PT Goal Formulation: With patient Time For Goal Achievement: 03/03/21 Potential to Achieve Goals: Good Progress towards PT goals: Progressing toward goals    Frequency    Min 3X/week      PT Plan Current plan remains  appropriate    Co-evaluation              AM-PAC PT "6 Clicks" Mobility   Outcome Measure  Help needed turning from your back to your side while in a flat bed without using bedrails?: None Help needed moving from lying on your back to sitting on the side of a flat bed without using bedrails?: None Help needed moving to and from a bed to a chair (including a wheelchair)?: A Little Help needed standing up from a chair using your arms (e.g., wheelchair or bedside chair)?: A Little Help needed to walk in hospital room?: A Little Help needed climbing 3-5 steps with a railing? : A Little 6 Click Score: 20    End of Session   Activity Tolerance: Patient tolerated treatment well;Patient limited by fatigue Patient left: in chair;with call bell/phone within reach;with chair alarm set Nurse Communication: Mobility status PT Visit Diagnosis: Unsteadiness on feet (R26.81);Muscle weakness (generalized) (M62.81);Difficulty in walking, not elsewhere classified (R26.2)     Time: 5573-2202 PT Time Calculation (min) (ACUTE ONLY): 20 min  Charges:  $Therapeutic Activity: 8-22 mins                     Thermon Leyland, SPT Acute Rehab Services    Thermon Leyland 02/19/2021, 3:26 PM

## 2021-02-19 NOTE — Discharge Instructions (Signed)
You were admitted for slurred speech and left arm weakness. We did many tests on you to determine what the cause may be. You did not have a stroke. It appears that this could have been due to your poor kidneys, but not sure. We got a fistula placed while you were admitted so that you can start dialysis soon in order to feel good and to prevent something similar. Please follow up with your kidney doctor to continue next steps for starting dialysis. Also, please follow up with your PCP doctor for a post hospital follow up.

## 2021-02-19 NOTE — Transfer of Care (Signed)
Immediate Anesthesia Transfer of Care Note  Patient: Jacob [name]  Procedure(s) Performed: ARTERIOVENOUS (AV) FISTULA CREATION (Right: Arm Upper)  Patient Location: PACU  Anesthesia Type:General  Level of Consciousness: drowsy  Airway & Oxygen Therapy: Patient Spontanous Breathing  Post-op Assessment: Report given to RN and Post -op Vital signs reviewed and stable  Post vital signs: Reviewed and stable  Last Vitals:  Vitals Value Taken Time  BP 98/52 02/19/21 1212  Temp 37.1 C 02/19/21 1210  Pulse 80 02/19/21 1213  Resp 11 02/19/21 1213  SpO2 98 % 02/19/21 1213  Vitals shown include unvalidated device data.  Last Pain:  Vitals:   02/19/21 0836  TempSrc: Oral  PainSc:          Complications: No notable events documented.

## 2021-02-19 NOTE — Op Note (Signed)
° ° °  NAME: Jacob [name]    MRN: [id number] DOB: 1945-10-28    DATE OF OPERATION: 02/19/2021  PREOP DIAGNOSIS:    Stage IV chronic kidney disease  POSTOP DIAGNOSIS:    Same  PROCEDURE:    Right arm, first stage brachiobasilic fistula  SURGEON: [name]  ASSIST: None  ANESTHESIA: General  EBL: 10 mL  INDICATIONS:    [name] is a 76 y.o. male with chronic kidney disease, progressing toward end-stage renal disease.  I was contacted by nephrology for long-term HD access.  Vein mapping demonstrated suitable basilic veins bilaterally for AV fistula creation.  I had a long discussion with Lona Millard regarding right-sided first stage brachiobasilic fistula creation, as the patient is left-handed.  He will need to return in roughly 2 months for fistula superficialization.  After discussing the risks and benefits, Lona Millard elected to proceed.  FINDINGS:   5 mm brachial artery, 5 mm basilic vein  TECHNIQUE:    The patient was brought to the operating room and placed in supine position. The right arm was prepped and draped in a standard fashion. IV antibiotics were prior to incision. A timeout was performed.   The basilic vein in the right arm was identified using ultrasound and appeared of sufficient size. A transverse incision was made above the elbow creese medial to the antecubital fossa. The basilic vein was identified and isolated for 4 cm in length.  The bicipital aponeurosis was partially released and the brachial artery freed from its paired brachial veins and secured with a vessel loop. The patient was heparinized. The basilic vein was marked and ligated distally with 2-0 silk, then flushed with heparinized saline. Vascular clamps were placed proximally and distally on the brachial artery and a 5 mm arteriotomy  was created on the brachial artery. This was flushed with heparin saline. The vein was juxtaposed to the artery and an anastomosis was created using 6-0  Prolene.   Prior to completing the anastomsis, the vessels were flushed and the suture line was tied down. There was an excellent thrill in the basilic vein from the anastomosis into the upper arm. The patient had a multiphasic radial signal. The incision was irrigated and hemostasis acheived. The deeper tissue was closed with 3-0 Vicryl and the skin closed with 4-0 Monocryl.    Dermabond was applied the incisions. He was transferred to PACU in stable condition.    Given the complexity of the case a first assistant was necessary in order to expedient the procedure and safely perform the technical aspects of the operation.  Macie Burows, MD [facility]  DATE OF DICTATION:   02/19/2021

## 2021-02-19 NOTE — Progress Notes (Addendum)
°  Daily Progress Note  Subjective: No complaints this morning  Objective: Vitals:   02/19/21 0801 02/19/21 0836  BP: (!) 141/92 138/88  Pulse: 86 80  Resp: 20 19  Temp: 98.4 F (36.9 C) 98.2 F (36.8 C)  SpO2: 98% 97%    Physical Examination Palpable radial pulses in bilateral upper extremities Regular rate Nonlabored breathing  ASSESSMENT/PLAN:  Patient is a 76 year old man with chronic kidney disease, progressing toward end-stage renal disease.  I was contacted by nephrology for long-term HD access.  Vein mapping demonstrated suitable basilic veins bilaterally for AV fistula creation.  I had a long discussion with Jacob [name] regarding right sided first stage brachiobasilic fistula creation, as the patient is left-handed.  He will need to return in roughly 2 months for fistula superficialization.  After discussing the risks and benefits, Jacob [name] elected to proceed.   Cassandria Santee MD MS [facility] [phone] 02/19/2021  9:53 AM

## 2021-02-19 NOTE — TOC Transition Note (Signed)
Transition of Care [facility]) - CM/SW Discharge Note   Patient Details  Name: Corin Tilly MRN: [id number] Date of Birth: Apr 01, 1945  Transition of [facility] CM/SW Contact:  Pollie Friar, RN Phone Number: 02/19/2021, 3:49 PM   Clinical Narrative:    Patient is discharging home with self care. No f/u per PT/OT. Pt requesting a rollator but he received a rolling walker in 2020 so can not receive a rollator under is insurance. Pt voiced understanding and states he will obtain one outside the hospital setting.  Pt has transport home.    Final next level of care: Home/Self Care Barriers to Discharge: No Barriers Identified   Patient Goals and CMS Choice        Discharge Placement                       Discharge Plan and Services                                     Social Determinants of Health (SDOH) Interventions     Readmission Risk Interventions Readmission Risk Prevention Plan 12/22/2019 09/17/2018  Transportation Screening Complete Complete  PCP or Specialist Appt within 3-5 Days Not Complete -  Not Complete comments first available appt on 12/23 -  [place] or Home Care Consult Complete -  Social Work Consult for [place] Planning/Counseling Complete -  [place] Screening Not Applicable Not Applicable  Medication Review Press photographer) - Complete  Some recent data might be hidden

## 2021-02-20 ENCOUNTER — Encounter (HOSPITAL_COMMUNITY): Payer: Self-pay | Admitting: Vascular Surgery

## 2021-02-20 LAB — METHYLMALONIC ACID, SERUM: Methylmalonic Acid, Quantitative: 1378 nmol/L — ABNORMAL HIGH (ref 0–378)

## 2021-02-20 LAB — FERRITIN: Ferritin: 68 ng/mL (ref 24–336)

## 2021-02-20 LAB — IRON AND TIBC
Iron: 111 ug/dL (ref 45–182)
Saturation Ratios: 35 % (ref 17.9–39.5)
TIBC: 318 ug/dL (ref 250–450)
UIBC: 207 ug/dL

## 2021-02-21 NOTE — Anesthesia Postprocedure Evaluation (Signed)
Anesthesia Post Note  Patient: Jacob [name]  Procedure(s) Performed: ARTERIOVENOUS (AV) FISTULA CREATION (Right: Arm Upper)     Patient location during evaluation: PACU Anesthesia Type: General Level of consciousness: awake and alert Pain management: pain level controlled Vital Signs Assessment: post-procedure vital signs reviewed and stable Respiratory status: spontaneous breathing, nonlabored ventilation, respiratory function stable and patient connected to nasal cannula oxygen Cardiovascular status: blood pressure returned to baseline and stable Postop Assessment: no apparent nausea or vomiting Anesthetic complications: no   No notable events documented.            Effie Berkshire

## 2021-02-22 ENCOUNTER — Other Ambulatory Visit: Payer: Self-pay | Admitting: Internal Medicine

## 2021-02-22 MED ORDER — CYANOCOBALAMIN 500 MCG PO TABS: 1000.0000 ug | ORAL_TABLET | Freq: Every day | ORAL | 3 refills | Status: DC

## 2021-02-22 NOTE — Progress Notes (Signed)
Call pt to instruct him to start Vit B12 supplementation. Called all phones on the chart, but pt is unavailable. Left voice mail for to pick up medication and to start taking.

## 2021-03-25 ENCOUNTER — Ambulatory Visit: Payer: Medicare Other | Admitting: Cardiology

## 2021-03-27 ENCOUNTER — Encounter: Payer: Self-pay | Admitting: Cardiology

## 2021-03-27 ENCOUNTER — Ambulatory Visit (INDEPENDENT_AMBULATORY_CARE_PROVIDER_SITE_OTHER): Payer: Medicare Other | Admitting: Cardiology

## 2021-03-27 ENCOUNTER — Other Ambulatory Visit: Payer: Self-pay

## 2021-03-27 VITALS — BP 104/62 | HR 72 | Ht 74.0 in | Wt 253.4 lb

## 2021-03-27 DIAGNOSIS — R9439 Abnormal result of other cardiovascular function study: Secondary | ICD-10-CM | POA: Diagnosis not present

## 2021-03-27 DIAGNOSIS — E669 Obesity, unspecified: Secondary | ICD-10-CM | POA: Diagnosis not present

## 2021-03-27 DIAGNOSIS — I10 Essential (primary) hypertension: Secondary | ICD-10-CM | POA: Diagnosis not present

## 2021-03-27 DIAGNOSIS — N184 Chronic kidney disease, stage 4 (severe): Secondary | ICD-10-CM | POA: Diagnosis not present

## 2021-03-27 NOTE — Progress Notes (Signed)
?Cardiology Office Note:   ? ?Date:  03/28/2021  ? ?ID:  Jacob [name], DOB 1945-05-19, MRN [id number] ? ?PCP:  [name], MD  ?Cardiologist:  Berniece Salines, DO  ?Electrophysiologist:  None  ? ?Referring MD: [name], MD  ? ?" I am doing on" ? ? ?History of Present Illness:   ? ?Jacob [name] is a 76 y.o. male with a hx of  hypertension, obesity, history of stomach cancer, chronic kidney disease and gout is here today. ?  ?I saw the patient on November 23, 2020 at that time he was experiencing intermittent chest discomfort.  I sent the patient for a nuclear stress test.  He was able to get this testing done on December 25, 2020 which reported evidence of ischemia.  ?  ?I saw the patient on January 10, 2021 at that time we talked about the abnormal stress test I started the patient on Crestor 10 mg daily, Aspirin 81 mg with Imdur 30 mg daily. During that time we talked about left heart catheterization and I wanted the patient to talk to his nephrologist first. ? ?At his visit on February 07, 2021 the patient told me that he had discussed with his nephrology who preferred that we hold off on proceeding with heart catheterization which actually was appropriate given the fact that he was worsening kidney failure and contrast would speed the process up. ? ?During that visit me and the patient discussed with his worsening kidney failure and also he had thought about this a little more he preferred medical therapy for the future.  I continue his aspirin, Crestor as well as his Imdur. ? ?Since I saw the patient has had a fistula implanted.  This is still maturing.  He has not had any recurrent angina symptoms on his medication regimen. ? ?Past Medical History:  ?Diagnosis Date  ? Anemia   ? hx iron deficiency  ? Anxiety   ? Arthritis   ? gout  ? Back pain   ? BPH with obstruction/lower urinary tract symptoms   ? Chronic kidney disease   ? "they said I do"  ? Constipation   ? Depression   ? ED  (erectile dysfunction)   ? Epigastric pain   ? GERD (gastroesophageal reflux disease)   ? Heartburn   ? History of blood transfusion   ? History of radiation therapy 11/03/11-12/29/11  ? prostate  ? Hypercholesterolemia   ? Hypertension   ? Night sweats   ? Oxygen deficiency   ? 2x per week at night for sleep  ? Post-operative nausea and vomiting 04/09/2016  ? Prostate cancer ([place]) 08/14/11  ? Adenocarcinoma,gleason:3+3=6,& 3+4=7,PSA=5.66  ? PUD (peptic ulcer disease)   ? Sleep apnea   ? does not use Cpap but does use O2 @ 2l   ? Stomach cancer ([place])   ? Ulcer   ? peptic ulcer hx  ? ? ?Past Surgical History:  ?Procedure Laterality Date  ? AV FISTULA PLACEMENT Right 02/19/2021  ? Procedure: ARTERIOVENOUS (AV) FISTULA CREATION;  Surgeon: [name], MD;  Location: [facility] OR;  Service: Vascular;  Laterality: Right;  ? colon polyps bx  11/24/06  ? colon,transverse and rectosigmoid polyps:tubular adenomas and hyperplastic polyps,no high grade dysplasia or malignancy   ? COLONOSCOPY WITH PROPOFOL N/A 08/24/2019  ? Procedure: COLONOSCOPY WITH PROPOFOL;  Surgeon: [name], DO;  Location: [facility] ENDOSCOPY;  Service: Gastroenterology;  Laterality: N/A;  ? duodenal bx  11/24/06  ? benign  ?  ESOPHAGOGASTRODUODENOSCOPY N/A 10/27/2015  ? Procedure: ESOPHAGOGASTRODUODENOSCOPY (EGD);  Surgeon: [name], MD;  Location: [facility];  Service: Endoscopy;  Laterality: N/A;  ? EUS N/A 11/08/2015  ? Procedure: UPPER ENDOSCOPIC ULTRASOUND (EUS) RADIAL;  Surgeon: [name], MD;  Location: [facility] ENDOSCOPY;  Service: Endoscopy;  Laterality: N/A;  ? GASTRECTOMY N/A 03/25/2016  ? Procedure: DISTAL GASTRECTOMY;  Surgeon: [name], MD;  Location: [place];  Service: General;  Laterality: N/A;  ? gastric bx  11/24/06  ? chronic active gastritis,with metaplasia and focal changaes of xanthelasma  ? GASTROSTOMY N/A 03/25/2016  ? Procedure: INSERTION OF FEEDING TUBE;  Surgeon: [name], MD;  Location: [place];  Service: General;   Laterality: N/A;  ? [place]  12-29-11  ? IR GENERIC HISTORICAL  04/01/2016  ? IR GJ TUBE CHANGE 04/01/2016 Markus Daft, MD [facility]  ? IR GENERIC HISTORICAL  04/05/2016  ? IR GJ TUBE CHANGE 04/05/2016 Markus Daft, MD [facility]  ? LAPAROSCOPIC GASTRECTOMY  03/25/2016  ? Diagnostic laparoscopy, distal gastrectomy with Billroth 2 reconstruction and gastrojejunostomy tube  ? LAPAROSCOPY N/A 03/25/2016  ? Procedure: DIAGNOSTIC LAPAROSCOPY;  Surgeon: [name], MD;  Location: [place];  Service: General;  Laterality: N/A;  ? POLYPECTOMY  08/24/2019  ? Procedure: POLYPECTOMY;  Surgeon: [name], DO;  Location: [facility];  Service: Gastroenterology;;  ? PORTACATH PLACEMENT Left 12/04/2015  ? Procedure: INSERTION PORT-A-CATH;  Surgeon: [name], MD;  Location: [place];  Service: General;  Laterality: Left;  ? PROSTATE BIOPSY  08/14/11  ? Adenocarcinoma/volume=58.51cc,gleason=3+3=6 & 3+4=7  ? TONSILLECTOMY    ? 46 years old  ? ? ?Current Medications: ?Current Meds  ?Medication Sig  ? acetaminophen (TYLENOL) 500 MG tablet Take 1,000 mg by mouth daily as needed for moderate pain or headache.  ? allopurinol (ZYLOPRIM) 300 MG tablet Take 150 mg by mouth daily.  ? aspirin EC 81 MG tablet Take 1 tablet (81 mg total) by mouth daily. Swallow whole.  ? buPROPion (WELLBUTRIN XL) 300 MG 24 hr tablet Take 300 mg by mouth daily.   ? calcium carbonate (TUMS - DOSED IN MG ELEMENTAL CALCIUM) 500 MG chewable tablet Chew 3 tablets by mouth 2 (two) times daily as needed for indigestion or heartburn.  ? diclofenac Sodium (VOLTAREN) 1 % GEL Apply 2 g topically 4 (four) times daily.  ? donepezil (ARICEPT) 5 MG tablet Take 1 tablet (5 mg total) by mouth at bedtime.  ? hydrALAZINE (APRESOLINE) 50 MG tablet Take 50 mg by mouth 3 (three) times daily.  ? melatonin 3 MG TABS tablet Take 9 mg by mouth at bedtime.  ? Nebivolol HCl 20 MG TABS Take 20 mg by mouth daily.  ? pantoprazole (PROTONIX) 40 MG tablet Take 1  tablet (40 mg total) by mouth daily before supper.  ? rosuvastatin (CRESTOR) 5 MG tablet Take 5 mg by mouth daily. Patient states he takes 15 MG daily  ? sodium bicarbonate 650 MG tablet Take 650 mg by mouth 2 (two) times daily.  ? sodium zirconium cyclosilicate (LOKELMA) 10 g PACK packet Take 10 g by mouth every other day.  ? tamsulosin (FLOMAX) 0.4 MG CAPS capsule Take 1 capsule by mouth every day  ? verapamil (CALAN) 80 MG tablet Take 80 mg by mouth 3 (three) times daily.   ?  ? ?Allergies:   Lisinopril  ? ?Social History  ? ?Socioeconomic History  ? Marital status: Married  ?  Spouse name: Jacob [name]  ?  Number of children: 3  ? Years of education: 44  ? Highest education level: Not on file  ?Occupational History  ? Occupation: retired  ?  Employer: [employer]  ?Tobacco Use  ? Smoking status: Former  ?  Packs/day: 1.50  ?  Years: 30.00  ?  Pack years: 45.00  ?  Types: Cigarettes  ?  Quit date: 04/13/2000  ?  Years since quitting: 20.9  ? Smokeless tobacco: Never  ?Vaping Use  ? Vaping Use: Never used  ?Substance and Sexual Activity  ? Alcohol use: Not Currently  ?  Alcohol/week: 1.0 standard drink  ?  Types: 1 Cans of beer per week  ?  Comment: a beer about once a week, less than in his youth  ? Drug use: No  ? Sexual activity: Not Currently  ?Other Topics Concern  ? Not on file  ?Social History Narrative  ? Patient is married Jacob [name]) and lives at home with his wife and one child.  ? Patient has three children.  ? Patient is retired.  ? Patient has a high school education.  ? Patient is ambi-dextrous.  ? Patient drinks two cups of coffee about three times a week.   ? ?Social Determinants of Health  ? ?Financial Resource Strain: Not on file  ?Food Insecurity: Not on file  ?Transportation Needs: Not on file  ?Physical Activity: Not on file  ?Stress: Not on file  ?Social Connections: Not on file  ?  ? ?Family History: ?The patient's family history includes Alcohol abuse in his father; Alcohol abuse (age of onset:  4) in his brother; Cancer in his mother and paternal aunt. There is no history of Colon cancer. ? ?ROS:   ?Review of Systems  ?Constitution: Negative for decreased appetite, fever and weight gain.  ?HENT: Negative fo

## 2021-03-27 NOTE — Patient Instructions (Signed)
Medication Instructions:  ?Your physician recommends that you continue on your current medications as directed. Please refer to the Current Medication list given to you today.  ?*If you need a refill on your cardiac medications before your next appointment, please call your pharmacy* ? ? ?Lab Work: ?None ?If you have labs (blood work) drawn today and your tests are completely normal, you will receive your results only by: ?MyChart Message (if you have MyChart) OR ?A paper copy in the mail ?If you have any lab test that is abnormal or we need to change your treatment, we will call you to review the results. ? ? ?Testing/Procedures: ?None ? ? ?Follow-Up: ?At [facility], you and your health needs are our priority.  As part of our continuing mission to provide you with exceptional heart care, we have created designated Provider Care Teams.  These Care Teams include your primary Cardiologist (physician) and Advanced Practice Providers (APPs -  Physician Assistants and Nurse Practitioners) who all work together to provide you with the care you need, when you need it. ? ?We recommend signing up for the patient portal called "MyChart".  Sign up information is provided on this After Visit Summary.  MyChart is used to connect with patients for Virtual Visits (Telemedicine).  Patients are able to view lab/test results, encounter notes, upcoming appointments, etc.  Non-urgent messages can be sent to your provider as well.   ?To learn more about what you can do with MyChart, go to [email or web address].   ? ?Your next appointment:   ?4 month(s) ? ?The format for your next appointment:   ?In Person ? ?Provider:   ?Berniece Salines, DO  ? ? ?Other Instructions ?  ?

## 2021-06-26 ENCOUNTER — Other Ambulatory Visit: Payer: Self-pay

## 2021-06-26 ENCOUNTER — Inpatient Hospital Stay: Payer: Medicare Other | Attending: Oncology

## 2021-06-26 ENCOUNTER — Inpatient Hospital Stay (HOSPITAL_BASED_OUTPATIENT_CLINIC_OR_DEPARTMENT_OTHER): Payer: Medicare Other | Admitting: Oncology

## 2021-06-26 VITALS — BP 113/72 | HR 81 | Temp 97.2°F | Resp 18 | Wt 235.3 lb

## 2021-06-26 DIAGNOSIS — D631 Anemia in chronic kidney disease: Secondary | ICD-10-CM | POA: Insufficient documentation

## 2021-06-26 DIAGNOSIS — N189 Chronic kidney disease, unspecified: Secondary | ICD-10-CM

## 2021-06-26 DIAGNOSIS — R911 Solitary pulmonary nodule: Secondary | ICD-10-CM | POA: Insufficient documentation

## 2021-06-26 DIAGNOSIS — Z8546 Personal history of malignant neoplasm of prostate: Secondary | ICD-10-CM | POA: Insufficient documentation

## 2021-06-26 DIAGNOSIS — C61 Malignant neoplasm of prostate: Secondary | ICD-10-CM | POA: Diagnosis not present

## 2021-06-26 DIAGNOSIS — C169 Malignant neoplasm of stomach, unspecified: Secondary | ICD-10-CM | POA: Insufficient documentation

## 2021-06-26 DIAGNOSIS — D472 Monoclonal gammopathy: Secondary | ICD-10-CM | POA: Diagnosis not present

## 2021-06-26 DIAGNOSIS — Z923 Personal history of irradiation: Secondary | ICD-10-CM | POA: Insufficient documentation

## 2021-06-26 DIAGNOSIS — Z888 Allergy status to other drugs, medicaments and biological substances status: Secondary | ICD-10-CM | POA: Insufficient documentation

## 2021-06-26 DIAGNOSIS — Z79899 Other long term (current) drug therapy: Secondary | ICD-10-CM | POA: Insufficient documentation

## 2021-06-26 LAB — CBC WITH DIFFERENTIAL (CANCER CENTER ONLY)
Abs Immature Granulocytes: 0.02 10*3/uL (ref 0.00–0.07)
Basophils Absolute: 0 10*3/uL (ref 0.0–0.1)
Basophils Relative: 0 %
Eosinophils Absolute: 0.4 10*3/uL (ref 0.0–0.5)
Eosinophils Relative: 5 %
HCT: 26.7 % — ABNORMAL LOW (ref 39.0–52.0)
Hemoglobin: 8.9 g/dL — ABNORMAL LOW (ref 13.0–17.0)
Immature Granulocytes: 0 %
Lymphocytes Relative: 19 %
Lymphs Abs: 1.4 10*3/uL (ref 0.7–4.0)
MCH: 32.6 pg (ref 26.0–34.0)
MCHC: 33.3 g/dL (ref 30.0–36.0)
MCV: 97.8 fL (ref 80.0–100.0)
Monocytes Absolute: 0.6 10*3/uL (ref 0.1–1.0)
Monocytes Relative: 8 %
Neutro Abs: 4.7 10*3/uL (ref 1.7–7.7)
Neutrophils Relative %: 68 %
Platelet Count: 181 10*3/uL (ref 150–400)
RBC: 2.73 MIL/uL — ABNORMAL LOW (ref 4.22–5.81)
RDW: 13.6 % (ref 11.5–15.5)
WBC Count: 7.1 10*3/uL (ref 4.0–10.5)
nRBC: 0 % (ref 0.0–0.2)

## 2021-06-26 LAB — CMP (CANCER CENTER ONLY)
ALT: 14 U/L (ref 0–44)
AST: 16 U/L (ref 15–41)
Albumin: 3.9 g/dL (ref 3.5–5.0)
Alkaline Phosphatase: 41 U/L (ref 38–126)
Anion gap: 9 (ref 5–15)
BUN: 85 mg/dL — ABNORMAL HIGH (ref 8–23)
CO2: 22 mmol/L (ref 22–32)
Calcium: 10 mg/dL (ref 8.9–10.3)
Chloride: 105 mmol/L (ref 98–111)
Creatinine: 5.67 mg/dL (ref 0.61–1.24)
GFR, Estimated: 10 mL/min — ABNORMAL LOW (ref >60)
Glucose, Bld: 105 mg/dL — ABNORMAL HIGH (ref 70–99)
Potassium: 4.2 mmol/L (ref 3.5–5.1)
Sodium: 136 mmol/L (ref 135–145)
Total Bilirubin: 0.3 mg/dL (ref 0.3–1.2)
Total Protein: 7.2 g/dL (ref 6.5–8.1)

## 2021-06-26 NOTE — Progress Notes (Signed)
Hematology and Oncology Follow Up Visit  Jacob [name] [id number] 10-17-1945 76 y.o. 06/26/2021 8:54 AM Jacob [name], MDShah, Jacob Forest, MD   Principle Diagnosis:  76 year old man with T3N0 adenocarcinoma of the stomach diagnosed in 2017.    Secondary diagnoses:   1.  Stage T1c prostate cancer diagnosed in 2013.  He presented with Gleason score 3+4 = 7 and a PSA 5.66 and currently on active surveillance.  2.  IgG lambda MGUS diagnosed in 2017 there is currently on active surveillance without any indication for treatment.  3.  Anemia of chronic kidney disease.  Prior Therapy:  He is status post radiation therapy for definitive treatment for his prostate cancer. Therapy concluded in December 2013. He is status post packed red cell transfusions in April 2017. He is status post bone marrow biopsy in April 2017. Results did not show any plasma cell disorder or myelodysplasia. He is status post IV iron infusion completed in April 2017. This was repeated in September 2017. FOLFOX chemotherapy with cycle 1 to be given on 12/12/2015.  He is S/P cycle 5 of therapy given on 02/20/2016. He is status post distal gastrectomy and Billroth II reconstruction completed on 03/25/2016. The final pathology revealed a T3 N0 residual tumor without any lymphadenopathy involved.  Current therapy: Active surveillance.   Interim History:  Jacob [name] is here for a follow-up visit.  Since the last visit, he reports no major changes in his health.  He was hospitalized back in February for acute renal failure and urinary retention and has resolved at this time.  He has resumed most activities of daily living.  He is eating less but has not reported any abdominal pain, distention or changes in his bowel habits.     Medications: Reviewed without changes. Current Outpatient Medications  Medication Sig Dispense Refill   acetaminophen (TYLENOL) 500 MG tablet Take 1,000 mg by mouth daily as needed  for moderate pain or headache.     allopurinol (ZYLOPRIM) 300 MG tablet Take 150 mg by mouth daily.     aspirin EC 81 MG tablet Take 1 tablet (81 mg total) by mouth daily. Swallow whole. 90 tablet 3   buPROPion (WELLBUTRIN XL) 300 MG 24 hr tablet Take 300 mg by mouth daily.      calcium carbonate (TUMS - DOSED IN MG ELEMENTAL CALCIUM) 500 MG chewable tablet Chew 3 tablets by mouth 2 (two) times daily as needed for indigestion or heartburn.     diclofenac Sodium (VOLTAREN) 1 % GEL Apply 2 g topically 4 (four) times daily. 100 g 1   donepezil (ARICEPT) 5 MG tablet Take 1 tablet (5 mg total) by mouth at bedtime. 30 tablet 6   furosemide (LASIX) 20 MG tablet Take 20 mg by mouth daily.  (Patient not taking: Reported on 03/27/2021)     hydrALAZINE (APRESOLINE) 50 MG tablet Take 50 mg by mouth 3 (three) times daily.     lidocaine (XYLOCAINE) 5 % ointment Apply 1 application topically as needed. (Patient not taking: Reported on 02/16/2021) 35.44 g 0   melatonin 3 MG TABS tablet Take 9 mg by mouth at bedtime.     Nebivolol HCl 20 MG TABS Take 20 mg by mouth daily.     pantoprazole (PROTONIX) 40 MG tablet Take 1 tablet (40 mg total) by mouth daily before supper. 30 tablet 0   rosuvastatin (CRESTOR) 5 MG tablet Take 5 mg by mouth daily. Patient states he takes 15 MG daily  sodium bicarbonate 650 MG tablet Take 650 mg by mouth 2 (two) times daily.     sodium zirconium cyclosilicate (LOKELMA) 10 g PACK packet Take 10 g by mouth every other day.     tamsulosin (FLOMAX) 0.4 MG CAPS capsule Take 1 capsule by mouth every day 30 capsule 11   verapamil (CALAN) 80 MG tablet Take 80 mg by mouth 3 (three) times daily.      vitamin B-12 (CYANOCOBALAMIN) 500 MCG tablet Take 2 tablets (1,000 mcg total) by mouth daily. (Patient not taking: Reported on 03/27/2021) 30 tablet 3   No current facility-administered medications for this visit.     Allergies:  Allergies  Allergen Reactions   Lisinopril Cough        Physical Exam:     Blood pressure 113/72, pulse 81, temperature (!) 97.2 F (36.2 C), temperature source Tympanic, resp. rate 18, weight 235 lb 5 oz (106.7 kg), SpO2 95 %.     ECOG: 1   General appearance: Comfortable appearing without any discomfort Head: Normocephalic without any trauma Oropharynx: Mucous membranes are moist and pink without any thrush or ulcers. Eyes: Pupils are equal and round reactive to light. Lymph nodes: No cervical, supraclavicular, inguinal or axillary lymphadenopathy.   Heart:regular rate and rhythm.  S1 and S2 without leg edema. Lung: Clear without any rhonchi or wheezes.  No dullness to percussion. Abdomin: Soft, nontender, nondistended with good bowel sounds.  No hepatosplenomegaly. Musculoskeletal: No joint deformity or effusion.  Full range of motion noted. Neurological: No deficits noted on motor, sensory and deep tendon reflex exam. Skin: No petechial rash or dryness.  Appeared moist.        Lab Results: Lab Results  Component Value Date   WBC 6.6 02/19/2021   HGB 8.6 (L) 02/19/2021   HCT 26.2 (L) 02/19/2021   MCV 98.5 02/19/2021   PLT 155 02/19/2021     Chemistry      Component Value Date/Time   NA 134 (L) 02/19/2021 0218   NA 141 01/16/2017 0924   K 4.2 02/19/2021 0218   K 4.8 01/16/2017 0924   CL 104 02/19/2021 0218   CO2 21 (L) 02/19/2021 0218   CO2 25 01/16/2017 0924   BUN 75 (H) 02/19/2021 0218   BUN 24.0 01/16/2017 0924   CREATININE 4.89 (H) 02/19/2021 0218   CREATININE 3.53 (HH) 12/24/2020 1333   CREATININE 1.9 (H) 01/16/2017 0924      Component Value Date/Time   CALCIUM 9.4 02/19/2021 0218   CALCIUM 9.8 01/16/2017 0924   ALKPHOS 42 02/16/2021 1403   ALKPHOS 54 01/16/2017 0924   AST 17 02/16/2021 1403   AST 26 12/24/2020 1333   AST 16 01/16/2017 0924   ALT 10 02/16/2021 1403   ALT 24 12/24/2020 1333   ALT 10 01/16/2017 0924   BILITOT 0.4 02/16/2021 1403   BILITOT 0.5 12/24/2020 1333   BILITOT  0.54 01/16/2017 0924        Impression and Plan:  76 year old man with:    1.  T3N0 adenocarcinoma of the gastric Mucosa diagnosed in 2017.    He continues to be on active surveillance after definitive treatment outlined above.  Risks and benefits of continued active surveillance were reviewed and the plan is to update his staging scan within the next 6 months.  I do not recommend any further surveillance scans after that.   2.  Anemia: Related to chronic kidney failure and might benefit from growth factor support in the future.  We will continue to monitor.   3.  IgG MGUS: We will update protein studies in 6 months and annually.  He has no evidence of endorgan damage to suggest multiple myeloma.    4. Renal insufficiency: His creatinine clearance continues to be around 12 cc/min.  He has a fistula in place in anticipation of dialysis.  Continues to follow with nephrology regarding this issue.  5.  Pulmonary nodule: We will repeat imaging studies in 6 months for evaluation.   6. Follow-up: In 6 months for a follow-up visit and repeat imaging studies.  30  minutes were dedicated to this encounter.  The time was spent on reviewing laboratory data, disease status update and outlining future plan of care discussion.   Zola Button, MD 6/14/20238:54 AM

## 2021-06-27 LAB — KAPPA/LAMBDA LIGHT CHAINS
Kappa free light chain: 250.6 mg/L — ABNORMAL HIGH (ref 3.3–19.4)
Kappa, lambda light chain ratio: 3.34 — ABNORMAL HIGH (ref 0.26–1.65)
Lambda free light chains: 75.1 mg/L — ABNORMAL HIGH (ref 5.7–26.3)

## 2021-07-01 LAB — MULTIPLE MYELOMA PANEL, SERUM
Albumin SerPl Elph-Mcnc: 3.5 g/dL (ref 2.9–4.4)
Albumin/Glob SerPl: 1.1 (ref 0.7–1.7)
Alpha 1: 0.3 g/dL (ref 0.0–0.4)
Alpha2 Glob SerPl Elph-Mcnc: 0.9 g/dL (ref 0.4–1.0)
B-Globulin SerPl Elph-Mcnc: 1 g/dL (ref 0.7–1.3)
Gamma Glob SerPl Elph-Mcnc: 1.1 g/dL (ref 0.4–1.8)
Globulin, Total: 3.2 g/dL (ref 2.2–3.9)
IgA: 314 mg/dL (ref 61–437)
IgG (Immunoglobin G), Serum: 1029 mg/dL (ref 603–1613)
IgM (Immunoglobulin M), Srm: 31 mg/dL (ref 15–143)
M Protein SerPl Elph-Mcnc: 0.5 g/dL — ABNORMAL HIGH
Total Protein ELP: 6.7 g/dL (ref 6.0–8.5)

## 2021-07-03 ENCOUNTER — Other Ambulatory Visit: Payer: Self-pay | Admitting: *Deleted

## 2021-07-03 DIAGNOSIS — N186 End stage renal disease: Secondary | ICD-10-CM

## 2021-07-10 NOTE — Progress Notes (Signed)
Office Note    HPI: Jacob [name] is a 76 y.o. ([birth year]) male presenting in follow-up status post 02/19/2021 right, first stage, brachiobasilic fistula creation. 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  On exam today, Jacob [name] was doing well with no complaints.  He denied symptoms of steal syndrome in the hand. He had no wound healing concerns.    Past Medical History:  Diagnosis Date   Anemia    hx iron deficiency   Anxiety    Arthritis    gout   Back pain    BPH with obstruction/lower urinary tract symptoms    Chronic kidney disease    "they said I do"   Constipation    Depression    ED (erectile dysfunction)    Epigastric pain    GERD (gastroesophageal reflux disease)    Heartburn    History of blood transfusion    History of radiation therapy 11/03/11-12/29/11   prostate   Hypercholesterolemia    Hypertension    Night sweats    Oxygen deficiency    2x per week at night for sleep   Post-operative nausea and vomiting 04/09/2016   Prostate cancer ([place]) 08/14/11   Adenocarcinoma,gleason:3+3=6,& 3+4=7,PSA=5.66   PUD (peptic ulcer disease)    Sleep apnea    does not use Cpap but does use O2 @ 2l    Stomach cancer ([place])    Ulcer    peptic ulcer hx    Past Surgical History:  Procedure Laterality Date   AV FISTULA PLACEMENT Right 02/19/2021   Procedure: ARTERIOVENOUS (AV) FISTULA CREATION;  Surgeon: [name], MD;  Location: [place];  Service: Vascular;  Laterality: Right;   colon polyps bx  11/24/06   colon,transverse and rectosigmoid polyps:tubular adenomas and hyperplastic polyps,no high grade dysplasia or malignancy    COLONOSCOPY WITH PROPOFOL N/A 08/24/2019   Procedure: COLONOSCOPY WITH PROPOFOL;  Surgeon: [name], DO;  Location: [facility];  Service: Gastroenterology;  Laterality: N/A;   duodenal bx  11/24/06   benign   ESOPHAGOGASTRODUODENOSCOPY N/A 10/27/2015   Procedure: ESOPHAGOGASTRODUODENOSCOPY (EGD);   Surgeon: [name], MD;  Location: [facility];  Service: Endoscopy;  Laterality: N/A;   EUS N/A 11/08/2015   Procedure: UPPER ENDOSCOPIC ULTRASOUND (EUS) RADIAL;  Surgeon: [name], MD;  Location: [facility];  Service: Endoscopy;  Laterality: N/A;   GASTRECTOMY N/A 03/25/2016   Procedure: DISTAL GASTRECTOMY;  Surgeon: [name], MD;  Location: [place];  Service: General;  Laterality: N/A;   gastric bx  11/24/06   chronic active gastritis,with metaplasia and focal changaes of xanthelasma   GASTROSTOMY N/A 03/25/2016   Procedure: INSERTION OF FEEDING TUBE;  Surgeon: [name], MD;  Location: [place];  Service: General;  Laterality: N/A;   INSERTION PROSTATE RADIATION SEED  12-29-11   IR GENERIC HISTORICAL  04/01/2016   IR [place] TUBE CHANGE 04/01/2016 Markus Daft, MD [facility]   IR GENERIC HISTORICAL  04/05/2016   IR [place] TUBE CHANGE 04/05/2016 Markus Daft, MD [facility]   LAPAROSCOPIC GASTRECTOMY  03/25/2016   Diagnostic laparoscopy, distal gastrectomy with Billroth 2 reconstruction and gastrojejunostomy tube   LAPAROSCOPY N/A 03/25/2016   Procedure: DIAGNOSTIC LAPAROSCOPY;  Surgeon: [name], MD;  Location: [place];  Service: General;  Laterality: N/A;   POLYPECTOMY  08/24/2019   Procedure: POLYPECTOMY;  Surgeon: [name], DO;  Location: [facility];  Service: Gastroenterology;;   PORTACATH PLACEMENT Left 12/04/2015   Procedure: INSERTION PORT-A-CATH;  Surgeon: [name], MD;  Location: [place];  Service: General;  Laterality: Left;   PROSTATE BIOPSY  08/14/11   Adenocarcinoma/volume=58.51cc,gleason=3+3=6 & 3+4=7   TONSILLECTOMY     76 years old    Social History   Socioeconomic History   Marital status: Married    Spouse name: [name]   Number of children: 3   Years of education: 12   Highest education level: Not on file  Occupational History   Occupation: retired    Fish farm manager: [employer]  Tobacco Use   Smoking status: Former    Packs/day: 1.50     Years: 30.00    Total pack years: 45.00    Types: Cigarettes    Quit date: 04/13/2000    Years since quitting: 21.2   Smokeless tobacco: Never  Vaping Use   Vaping Use: Never used  Substance and Sexual Activity   Alcohol use: Not Currently    Alcohol/week: 1.0 standard drink of alcohol    Types: 1 Cans of beer per week    Comment: a beer about once a week, less than in his youth   Drug use: No   Sexual activity: Not Currently  Other Topics Concern   Not on file  Social History Narrative   Patient is married [name]) and lives at home with his wife and one child.   Patient has three children.   Patient is retired.   Patient has a high school education.   Patient is ambi-dextrous.   Patient drinks two cups of coffee about three times a week.    Social Determinants of Health   Financial Resource Strain: Not on file  Food Insecurity: Not on file  Transportation Needs: Not on file  Physical Activity: Not on file  Stress: Not on file  Social Connections: Not on file  Intimate Partner Violence: Not on file   Family History  Problem Relation Age of Onset   Cancer Mother        NOS   Alcohol abuse Father    Alcohol abuse Brother 92   Cancer Paternal Aunt        NOS   Colon cancer Neg Hx     Current Outpatient Medications  Medication Sig Dispense Refill   acetaminophen (TYLENOL) 500 MG tablet Take 1,000 mg by mouth daily as needed for moderate pain or headache.     allopurinol (ZYLOPRIM) 300 MG tablet Take 150 mg by mouth daily.     aspirin EC 81 MG tablet Take 1 tablet (81 mg total) by mouth daily. Swallow whole. 90 tablet 3   buPROPion (WELLBUTRIN XL) 300 MG 24 hr tablet Take 300 mg by mouth daily.      calcium carbonate (TUMS - DOSED IN MG ELEMENTAL CALCIUM) 500 MG chewable tablet Chew 3 tablets by mouth 2 (two) times daily as needed for indigestion or heartburn.     diclofenac Sodium (VOLTAREN) 1 % GEL Apply 2 g topically 4 (four) times daily. 100 g 1   donepezil  (ARICEPT) 5 MG tablet Take 1 tablet (5 mg total) by mouth at bedtime. 30 tablet 6   furosemide (LASIX) 20 MG tablet Take 20 mg by mouth daily.  (Patient not taking: Reported on 03/27/2021)     hydrALAZINE (APRESOLINE) 50 MG tablet Take 50 mg by mouth 3 (three) times daily.     lidocaine (XYLOCAINE) 5 % ointment Apply 1 application topically as needed. (Patient not taking: Reported on 02/16/2021) 35.44 g 0   melatonin 3 MG  TABS tablet Take 9 mg by mouth at bedtime.     Nebivolol HCl 20 MG TABS Take 20 mg by mouth daily.     pantoprazole (PROTONIX) 40 MG tablet Take 1 tablet (40 mg total) by mouth daily before supper. 30 tablet 0   rosuvastatin (CRESTOR) 5 MG tablet Take 5 mg by mouth daily. Patient states he takes 15 MG daily     sodium bicarbonate 650 MG tablet Take 650 mg by mouth 2 (two) times daily.     sodium zirconium cyclosilicate (LOKELMA) 10 g PACK packet Take 10 g by mouth every other day.     tamsulosin (FLOMAX) 0.4 MG CAPS capsule Take 1 capsule by mouth every day 30 capsule 11   verapamil (CALAN) 80 MG tablet Take 80 mg by mouth 3 (three) times daily.      vitamin B-12 (CYANOCOBALAMIN) 500 MCG tablet Take 2 tablets (1,000 mcg total) by mouth daily. (Patient not taking: Reported on 03/27/2021) 30 tablet 3   No current facility-administered medications for this visit.    Allergies  Allergen Reactions   Lisinopril Cough     REVIEW OF SYSTEMS:  '[X]'$  denotes positive finding, '[ ]'$  denotes negative finding Cardiac  Comments:  Chest pain or chest pressure:    Shortness of breath upon exertion:    Short of breath when lying flat:    Irregular heart rhythm:        Vascular    Pain in calf, thigh, or hip brought on by ambulation:    Pain in feet at night that wakes you up from your sleep:     Blood clot in your veins:    Leg swelling:         Pulmonary    Oxygen at home:    Productive cough:     Wheezing:         Neurologic    Sudden weakness in arms or legs:     Sudden  numbness in arms or legs:     Sudden onset of difficulty speaking or slurred speech:    Temporary loss of vision in one eye:     Problems with dizziness:         Gastrointestinal    Blood in stool:     Vomited blood:         Genitourinary    Burning when urinating:     Blood in urine:        Psychiatric    Major depression:         Hematologic    Bleeding problems:    Problems with blood clotting too easily:        Skin    Rashes or ulcers:        Constitutional    Fever or chills:      PHYSICAL EXAMINATION:  There were no vitals filed for this visit.  General:  WDWN in NAD; vital signs documented above Gait: Not observed HENT: WNL, normocephalic Pulmonary: normal non-labored breathing , without wheezing Cardiac: regular HR, Abdomen: soft, NT, no masses Skin: without rashes Vascular Exam/Pulses:  Right Left  Radial 2+ (normal) 2+ (normal)  Ulnar 2+ (normal) 2+ (normal)                   Extremities: without ischemic changes, without Gangrene , without cellulitis; without open wounds;  Right arm with palpable thrill in the brachiobasilic fistula Musculoskeletal: no muscle wasting or atrophy  Neurologic: A&O X 3;  No focal  weakness or paresthesias are detected Psychiatric:  The pt has Normal affect.   Non-Invasive Vascular Imaging:   Summary:  Patent right first stage BVT.  Elevated velocity in the distal upper arm. at a confluence with a branch.  Narrowing of the proximal brachial artery with elevated velocity.  Ectatic appearing distal axillary vein.    ASSESSMENT/PLAN: Jacob [name] is a 76 y.o. male presenting status post right arm for stage brachiobasilic fistula creation.  The vein has an excellent thrill, with 2 L of flow per minute.  I had a long discussion with Jacob [name] regarding the above.  The fistula will need to be superficial lysed prior to use.  After discussing the risk Will work to schedule in the coming weeks.  [name],  MD [facility] [phone]

## 2021-07-12 ENCOUNTER — Other Ambulatory Visit: Payer: Self-pay

## 2021-07-12 ENCOUNTER — Ambulatory Visit: Payer: Medicare Other | Admitting: Vascular Surgery

## 2021-07-12 ENCOUNTER — Encounter: Payer: Self-pay | Admitting: Vascular Surgery

## 2021-07-12 ENCOUNTER — Ambulatory Visit (HOSPITAL_COMMUNITY)
Admission: RE | Admit: 2021-07-12 | Discharge: 2021-07-12 | Disposition: A | Discharge status: Home / Self Care | Payer: Medicare Other | Source: Ambulatory Visit | Attending: Vascular Surgery | Admitting: Vascular Surgery

## 2021-07-12 VITALS — BP 135/78 | HR 78 | Temp 97.7°F | Resp 18 | Ht 74.0 in | Wt 232.0 lb

## 2021-07-12 DIAGNOSIS — N185 Chronic kidney disease, stage 5: Secondary | ICD-10-CM | POA: Diagnosis not present

## 2021-07-12 DIAGNOSIS — N186 End stage renal disease: Secondary | ICD-10-CM | POA: Diagnosis present

## 2021-07-22 ENCOUNTER — Encounter (HOSPITAL_COMMUNITY): Payer: Self-pay | Admitting: Vascular Surgery

## 2021-07-22 ENCOUNTER — Other Ambulatory Visit: Payer: Self-pay

## 2021-07-22 NOTE — Progress Notes (Signed)
Anesthesia Chart Review: Same day workup  Follows with cardiology for history of HTN, HLD, and recent abnormal stress test.  Stress test December 2022 was intermediate risk due to presence of ischemia.  This was discussed at follow-up visit with Dr. Harriet Masson on 03/27/2021.  Per note, "I saw the patient on January 10, 2021 at that time we talked about the abnormal stress test I started the patient on Crestor 10 mg daily, Aspirin 81 mg with Imdur 30 mg daily. During that time we talked about left heart catheterization and I wanted the patient to talk to his nephrologist first. At his visit on February 07, 2021 the patient told me that he had discussed with his nephrology who preferred that we hold off on proceeding with heart catheterization which actually was appropriate given the fact that he was worsening kidney failure and contrast would speed the process up. During that visit me and the patient discussed with his worsening kidney failure and also he had thought about this a little more he preferred medical therapy for the future.  I continue his aspirin, Crestor as well as his Imdur. Since I saw the patient has had a fistula implanted.  This is still maturing.  He has not had any recurrent angina symptoms on his medication regimen.Marland KitchenMarland KitchenNo recurrent angina symptoms.  We will keep him on his medical management will include aspirin 81 mg daily, Crestor, Imdur.  He prefers not to proceed with left heart catheterization at this time.  We will continue to monitor the patient."   OSA not on CPAP, uses supplemental O2 2 L nightly.  CKD 5, not yet on HD.   Follows with oncology for history of anemia, IgG MGUS, prostate cancer treated with radiation 2013, and gastric cancer s/p gastrectomy and Billroth II reconstruction 2018.  CMP and CBC from 06/27/2018 reviewed, creatinine 5.67 consistent with CKD 5, chronic anemia stable with hemoglobin 8.9, otherwise unremarkable.  Patient will need day of surgery  evaluation.  EKG 02/16/2021: Sinus rhythm.  Rate 77.  Short PR.  Probable LAE.  Nuclear stress 12/25/2020:   Findings are consistent with ischemia.   No ST deviation was noted.   Left ventricular function is normal. End diastolic cavity size is normal.   LV perfusion is abnormal. There is evidence of ischemia. Defect 1: There is a medium defect with moderate reduction in uptake present in the mid to basal inferior and inferolateral location(s) that is reversible. There is normal wall motion in the defect area. Consistent with ischemia.   Consistent with ischemia.The study is intermediate risk.   Prior study not available for comparison.   TTE 12/25/2020:  1. Left ventricular ejection fraction, by estimation, is 50 to 55%. Left  ventricular ejection fraction by PLAX is 50 %. The left ventricle has low  normal function. The left ventricle has no regional wall motion  abnormalities. Left ventricular diastolic  parameters are consistent with Grade I diastolic dysfunction (impaired  relaxation).   2. Right ventricular systolic function is normal. The right ventricular  size is normal. There is normal pulmonary artery systolic pressure. The  estimated right ventricular systolic pressure is 45.8 mmHg.   3. The mitral valve is abnormal. Mild mitral valve regurgitation.   4. The aortic valve is tricuspid. Aortic valve regurgitation is not  visualized. Aortic valve sclerosis is present, with no evidence of aortic  valve stenosis.   5. The inferior vena cava is normal in size with greater than 50%  respiratory variability, suggesting right  atrial pressure of 3 mmHg.   Comparison(s): No prior Echocardiogram.     Wynonia Musty [facility] Short Stay Center/Anesthesiology Phone [phone] 07/22/2021 11:59 AM

## 2021-07-22 NOTE — Anesthesia Preprocedure Evaluation (Signed)
Anesthesia Evaluation  Patient identified by MRN, date of birth, ID band Patient awake    Reviewed: Allergy & Precautions, NPO status , Patient's Chart, lab work & pertinent test results, reviewed documented beta blocker date and time   History of Anesthesia Complications (+) PONV and history of anesthetic complications  Airway Mallampati: III  TM Distance: >3 FB Neck ROM: Full  Mouth opening: Limited Mouth Opening  Dental  (+) Edentulous Upper, Edentulous Lower, Dental Advisory Given   Pulmonary sleep apnea and Oxygen sleep apnea , former smoker,    Pulmonary exam normal breath sounds clear to auscultation       Cardiovascular hypertension, Pt. on medications and Pt. on home beta blockers + angina Normal cardiovascular exam Rhythm:Regular Rate:Normal  TTE 2022 1. Left ventricular ejection fraction, by estimation, is 50 to 55%. Left  ventricular ejection fraction by PLAX is 50 %. The left ventricle has low  normal function. The left ventricle has no regional wall motion  abnormalities. Left ventricular diastolic  parameters are consistent with Grade I diastolic dysfunction (impaired  relaxation).  2. Right ventricular systolic function is normal. The right ventricular  size is normal. There is normal pulmonary artery systolic pressure. The  estimated right ventricular systolic pressure is 62.1 mmHg.  3. The mitral valve is abnormal. Mild mitral valve regurgitation.  4. The aortic valve is tricuspid. Aortic valve regurgitation is not  visualized. Aortic valve sclerosis is present, with no evidence of aortic  valve stenosis.  5. The inferior vena cava is normal in size with greater than 50%  respiratory variability, suggesting right atrial pressure of 3 mmHg.   Nuclear stress 12/25/2020: . Findings are consistent with ischemia. . No ST deviation was noted. . Left ventricular function is normal. End diastolic cavity size  is normal. . LV perfusion is abnormal. There is evidence of ischemia. Defect 1: There is a medium defect with moderate reduction in uptake present in the mid to basal inferior and inferolateral location(s) that is reversible. There is normal wall motion in the defect area. Consistent with ischemia. . Consistent with ischemia.The study is intermediate risk. . Prior study not available for comparison Plan for medical management. No cath at this time given CKD.    Neuro/Psych PSYCHIATRIC DISORDERS Anxiety Depression CVA    GI/Hepatic Neg liver ROS, PUD, GERD  ,  Endo/Other  negative endocrine ROS  Renal/GU ESRFRenal disease  negative genitourinary   Musculoskeletal  (+) Arthritis ,   Abdominal   Peds  Hematology  (+) Blood dyscrasia (multiple myeloma), anemia ,   Anesthesia Other Findings   Reproductive/Obstetrics                          Anesthesia Physical Anesthesia Plan  ASA: 3  Anesthesia Plan: General   Post-op Pain Management: Tylenol PO (pre-op)*   Induction: Intravenous  PONV Risk Score and Plan: 3 and Ondansetron, Dexamethasone and TIVA  Airway Management Planned: LMA  Additional Equipment:   Intra-op Plan:   Post-operative Plan: Extubation in OR  Informed Consent: I have reviewed the patients History and Physical, chart, labs and discussed the procedure including the risks, benefits and alternatives for the proposed anesthesia with the patient or authorized representative who has indicated his/her understanding and acceptance.     Dental advisory given  Plan Discussed with: CRNA  Anesthesia Plan Comments: (   )    Anesthesia Quick Evaluation

## 2021-07-22 NOTE — Progress Notes (Signed)
PCP - Dr Keith Rake Cardiologist - Berniece Salines, DO Oncology - Dr Zola Button  Chest x-ray - 11/14/20 (2V) EKG - 02/16/21 Stress Test - 12/25/20 ECHO - 12/25/20 Cardiac Cath - n/a  ICD Pacemaker/Loop - n/a  Sleep Study -  Yes  CPAP -no CPAP, occasional use oxygen 2L qhs  Anesthesia review: Yes  STOP now taking any Aspirin (unless otherwise instructed by your surgeon), Aleve, Naproxen, Ibuprofen, Motrin, Advil, Goody's, BC's, all herbal medications, fish oil, and all vitamins.   Coronavirus Screening Do you have any of the following symptoms:  Cough yes/no: No Fever (>100.38F)  yes/no: No Runny nose yes/no: No Sore throat yes/no: No Difficulty breathing/shortness of breath  yes/no: No  Have you traveled in the last 14 days and where? yes/no: No  Patient verbalized understanding of instructions that were given via phone.

## 2021-07-23 ENCOUNTER — Other Ambulatory Visit: Payer: Self-pay

## 2021-07-23 ENCOUNTER — Ambulatory Visit (HOSPITAL_COMMUNITY): Payer: Medicare Other | Admitting: Physician Assistant

## 2021-07-23 ENCOUNTER — Ambulatory Visit (HOSPITAL_BASED_OUTPATIENT_CLINIC_OR_DEPARTMENT_OTHER): Payer: Medicare Other | Admitting: Physician Assistant

## 2021-07-23 ENCOUNTER — Ambulatory Visit (HOSPITAL_COMMUNITY)
Admission: RE | Admit: 2021-07-23 | Discharge: 2021-07-23 | Disposition: A | Discharge status: Home / Self Care | Payer: Medicare Other | Attending: Vascular Surgery | Admitting: Vascular Surgery

## 2021-07-23 ENCOUNTER — Encounter (HOSPITAL_COMMUNITY)
Admission: RE | Disposition: A | Discharge status: Home / Self Care | Payer: Self-pay | Source: Home / Self Care | Attending: Vascular Surgery

## 2021-07-23 DIAGNOSIS — E785 Hyperlipidemia, unspecified: Secondary | ICD-10-CM | POA: Diagnosis not present

## 2021-07-23 DIAGNOSIS — G4733 Obstructive sleep apnea (adult) (pediatric): Secondary | ICD-10-CM | POA: Diagnosis not present

## 2021-07-23 DIAGNOSIS — D631 Anemia in chronic kidney disease: Secondary | ICD-10-CM | POA: Diagnosis not present

## 2021-07-23 DIAGNOSIS — Z87891 Personal history of nicotine dependence: Secondary | ICD-10-CM

## 2021-07-23 DIAGNOSIS — N185 Chronic kidney disease, stage 5: Secondary | ICD-10-CM | POA: Insufficient documentation

## 2021-07-23 DIAGNOSIS — I120 Hypertensive chronic kidney disease with stage 5 chronic kidney disease or end stage renal disease: Secondary | ICD-10-CM

## 2021-07-23 DIAGNOSIS — N186 End stage renal disease: Secondary | ICD-10-CM | POA: Diagnosis not present

## 2021-07-23 DIAGNOSIS — F418 Other specified anxiety disorders: Secondary | ICD-10-CM

## 2021-07-23 HISTORY — PX: BASCILIC VEIN TRANSPOSITION: SHX5742

## 2021-07-23 LAB — POCT I-STAT, CHEM 8
BUN: 49 mg/dL — ABNORMAL HIGH (ref 8–23)
Calcium, Ion: 1.21 mmol/L (ref 1.15–1.40)
Chloride: 104 mmol/L (ref 98–111)
Creatinine, Ser: 5 mg/dL — ABNORMAL HIGH (ref 0.61–1.24)
Glucose, Bld: 82 mg/dL (ref 70–99)
HCT: 25 % — ABNORMAL LOW (ref 39.0–52.0)
Hemoglobin: 8.5 g/dL — ABNORMAL LOW (ref 13.0–17.0)
Potassium: 4.4 mmol/L (ref 3.5–5.1)
Sodium: 138 mmol/L (ref 135–145)
TCO2: 22 mmol/L (ref 22–32)

## 2021-07-23 SURGERY — TRANSPOSITION, VEIN, BASILIC
Anesthesia: General | Site: Arm Upper | Laterality: Right

## 2021-07-23 MED ORDER — SODIUM CHLORIDE 0.9 % IV SOLN: INTRAVENOUS | Status: DC

## 2021-07-23 MED ORDER — PROTAMINE SULFATE 10 MG/ML IV SOLN
INTRAVENOUS | Status: AC
  Filled 2021-07-23: qty 5

## 2021-07-23 MED ORDER — PROTAMINE SULFATE 10 MG/ML IV SOLN
INTRAVENOUS | Status: DC | PRN
  Administered 2021-07-23: 20 mg via INTRAVENOUS

## 2021-07-23 MED ORDER — FENTANYL CITRATE (PF) 250 MCG/5ML IJ SOLN
INTRAMUSCULAR | Status: DC | PRN
  Administered 2021-07-23 (×3): 50 ug via INTRAVENOUS

## 2021-07-23 MED ORDER — ONDANSETRON HCL 4 MG/2ML IJ SOLN
INTRAMUSCULAR | Status: DC | PRN
  Administered 2021-07-23: 4 mg via INTRAVENOUS

## 2021-07-23 MED ORDER — HEPARIN 6000 UNIT IRRIGATION SOLUTION
Status: AC
  Filled 2021-07-23: qty 500

## 2021-07-23 MED ORDER — CHLORHEXIDINE GLUCONATE 0.12 % MT SOLN
OROMUCOSAL | Status: AC
  Administered 2021-07-23: 15 mL via OROMUCOSAL
  Filled 2021-07-23: qty 15

## 2021-07-23 MED ORDER — 0.9 % SODIUM CHLORIDE (POUR BTL) OPTIME
TOPICAL | Status: DC | PRN
  Administered 2021-07-23: 1000 mL

## 2021-07-23 MED ORDER — FENTANYL CITRATE (PF) 100 MCG/2ML IJ SOLN: 25.0000 ug | INTRAMUSCULAR | Status: DC | PRN

## 2021-07-23 MED ORDER — ONDANSETRON HCL 4 MG/2ML IJ SOLN
INTRAMUSCULAR | Status: AC
  Filled 2021-07-23: qty 2

## 2021-07-23 MED ORDER — PHENYLEPHRINE HCL-NACL 20-0.9 MG/250ML-% IV SOLN: INTRAVENOUS | Status: DC | PRN

## 2021-07-23 MED ORDER — HEMOSTATIC AGENTS (NO CHARGE) OPTIME
TOPICAL | Status: DC | PRN
  Administered 2021-07-23: 1 via TOPICAL
  Administered 2021-07-23: 3 via TOPICAL

## 2021-07-23 MED ORDER — DEXAMETHASONE SODIUM PHOSPHATE 10 MG/ML IJ SOLN
INTRAMUSCULAR | Status: DC | PRN
  Administered 2021-07-23: 5 mg via INTRAVENOUS

## 2021-07-23 MED ORDER — LIDOCAINE HCL (PF) 1 % IJ SOLN
INTRAMUSCULAR | Status: AC
  Filled 2021-07-23: qty 30

## 2021-07-23 MED ORDER — PROPOFOL 500 MG/50ML IV EMUL
INTRAVENOUS | Status: DC | PRN
  Administered 2021-07-23 (×2): 150 ug/kg/min via INTRAVENOUS

## 2021-07-23 MED ORDER — OXYCODONE-ACETAMINOPHEN 5-325 MG PO TABS: 1.0000 | ORAL_TABLET | ORAL | 0 refills | Status: DC | PRN

## 2021-07-23 MED ORDER — FENTANYL CITRATE (PF) 250 MCG/5ML IJ SOLN
INTRAMUSCULAR | Status: AC
  Filled 2021-07-23: qty 5

## 2021-07-23 MED ORDER — PROPOFOL 10 MG/ML IV BOLUS
INTRAVENOUS | Status: DC | PRN
  Administered 2021-07-23: 100 mg via INTRAVENOUS
  Administered 2021-07-23 (×2): 50 mg via INTRAVENOUS

## 2021-07-23 MED ORDER — CHLORHEXIDINE GLUCONATE 0.12 % MT SOLN: 15.0000 mL | Freq: Once | OROMUCOSAL | Status: AC

## 2021-07-23 MED ORDER — CEFAZOLIN SODIUM-DEXTROSE 2-4 GM/100ML-% IV SOLN
INTRAVENOUS | Status: AC
  Filled 2021-07-23: qty 100

## 2021-07-23 MED ORDER — CHLORHEXIDINE GLUCONATE 4 % EX LIQD: 60.0000 mL | Freq: Once | CUTANEOUS | Status: DC

## 2021-07-23 MED ORDER — ORAL CARE MOUTH RINSE: 15.0000 mL | Freq: Once | OROMUCOSAL | Status: AC

## 2021-07-23 MED ORDER — DEXAMETHASONE SODIUM PHOSPHATE 10 MG/ML IJ SOLN
INTRAMUSCULAR | Status: AC
  Filled 2021-07-23: qty 1

## 2021-07-23 MED ORDER — HEPARIN 6000 UNIT IRRIGATION SOLUTION
Status: DC | PRN
  Administered 2021-07-23: 1

## 2021-07-23 MED ORDER — ACETAMINOPHEN 500 MG PO TABS: 1000.0000 mg | ORAL_TABLET | Freq: Once | ORAL | Status: DC

## 2021-07-23 MED ORDER — PHENYLEPHRINE 80 MCG/ML (10ML) SYRINGE FOR IV PUSH (FOR BLOOD PRESSURE SUPPORT)
PREFILLED_SYRINGE | INTRAVENOUS | Status: AC
  Filled 2021-07-23: qty 10

## 2021-07-23 MED ORDER — HEPARIN SODIUM (PORCINE) 1000 UNIT/ML IJ SOLN
INTRAMUSCULAR | Status: DC | PRN
  Administered 2021-07-23: 2000 [IU] via INTRAVENOUS

## 2021-07-23 MED ORDER — PHENYLEPHRINE 80 MCG/ML (10ML) SYRINGE FOR IV PUSH (FOR BLOOD PRESSURE SUPPORT)
PREFILLED_SYRINGE | INTRAVENOUS | Status: DC | PRN
  Administered 2021-07-23 (×2): 80 ug via INTRAVENOUS
  Administered 2021-07-23: 160 ug via INTRAVENOUS

## 2021-07-23 MED ORDER — CEFAZOLIN SODIUM-DEXTROSE 2-4 GM/100ML-% IV SOLN
2.0000 g | INTRAVENOUS | Status: AC
  Administered 2021-07-23: 2 g via INTRAVENOUS

## 2021-07-23 MED ORDER — LIDOCAINE 2% (20 MG/ML) 5 ML SYRINGE
INTRAMUSCULAR | Status: DC | PRN
  Administered 2021-07-23: 60 mg via INTRAVENOUS

## 2021-07-23 SURGICAL SUPPLY — 54 items
ADH SKN CLS APL DERMABOND .7 (GAUZE/BANDAGES/DRESSINGS) ×1
ARMBAND PINK RESTRICT EXTREMIT (MISCELLANEOUS) ×3 IMPLANT
BAG COUNTER SPONGE SURGICOUNT (BAG) ×3 IMPLANT
BAG SPNG CNTER NS LX DISP (BAG) ×1
BNDG ELASTIC 4X5.8 VLCR STR LF (GAUZE/BANDAGES/DRESSINGS) ×1 IMPLANT
BNDG GAUZE DERMACEA FLUFF (GAUZE/BANDAGES/DRESSINGS) ×1
BNDG GAUZE DERMACEA FLUFF 4 (GAUZE/BANDAGES/DRESSINGS) IMPLANT
BNDG GZE DERMACEA 4 6PLY (GAUZE/BANDAGES/DRESSINGS) ×1
CANISTER SUCT 3000ML PPV (MISCELLANEOUS) ×3 IMPLANT
CLIP LIGATING EXTRA MED SLVR (CLIP) ×3 IMPLANT
CLIP LIGATING EXTRA SM BLUE (MISCELLANEOUS) ×3 IMPLANT
COVER PROBE W GEL 5X96 (DRAPES) ×4 IMPLANT
DERMABOND ADVANCED (GAUZE/BANDAGES/DRESSINGS) ×1
DERMABOND ADVANCED .7 DNX12 (GAUZE/BANDAGES/DRESSINGS) ×2 IMPLANT
DRSG COVADERM 4X6 (GAUZE/BANDAGES/DRESSINGS) ×1 IMPLANT
ELECT REM PT RETURN 9FT ADLT (ELECTROSURGICAL) ×2
ELECTRODE REM PT RTRN 9FT ADLT (ELECTROSURGICAL) ×2 IMPLANT
GLOVE BIO SURGEON STRL SZ7.5 (GLOVE) ×3 IMPLANT
GLOVE BIOGEL PI IND STRL 6.5 (GLOVE) IMPLANT
GLOVE BIOGEL PI IND STRL 8 (GLOVE) ×2 IMPLANT
GLOVE BIOGEL PI INDICATOR 6.5 (GLOVE) ×1
GLOVE BIOGEL PI INDICATOR 8 (GLOVE) ×1
GLOVE SRG 8 PF TXTR STRL LF DI (GLOVE) ×2 IMPLANT
GLOVE SURG POLYISO LF SZ8 (GLOVE) IMPLANT
GLOVE SURG UNDER POLY LF SZ8 (GLOVE) ×2
GOWN STRL REUS W/ TWL LRG LVL3 (GOWN DISPOSABLE) ×4 IMPLANT
GOWN STRL REUS W/TWL 2XL LVL3 (GOWN DISPOSABLE) ×3 IMPLANT
GOWN STRL REUS W/TWL LRG LVL3 (GOWN DISPOSABLE) ×4
HEMOSTAT SNOW SURGICEL 2X4 (HEMOSTASIS) ×4 IMPLANT
KIT BASIN OR (CUSTOM PROCEDURE TRAY) ×3 IMPLANT
KIT TURNOVER KIT B (KITS) ×3 IMPLANT
NDL HYPO 25GX1X1/2 BEV (NEEDLE) ×2 IMPLANT
NEEDLE HYPO 25GX1X1/2 BEV (NEEDLE) ×2 IMPLANT
NS IRRIG 1000ML POUR BTL (IV SOLUTION) ×3 IMPLANT
PACK CV ACCESS (CUSTOM PROCEDURE TRAY) ×3 IMPLANT
PAD ARMBOARD 7.5X6 YLW CONV (MISCELLANEOUS) ×6 IMPLANT
SLING ARM FOAM STRAP LRG (SOFTGOODS) ×1 IMPLANT
SLING ARM FOAM STRAP MED (SOFTGOODS) IMPLANT
SLING ARM IMMOBILIZER LRG (SOFTGOODS) ×1 IMPLANT
SPIKE FLUID TRANSFER (MISCELLANEOUS) ×3 IMPLANT
SPONGE T-LAP 18X18 ~~LOC~~+RFID (SPONGE) ×1 IMPLANT
STAPLER VISISTAT 35W (STAPLE) ×1 IMPLANT
SUT MNCRL AB 4-0 PS2 18 (SUTURE) ×3 IMPLANT
SUT PROLENE 6 0 BV (SUTURE) ×11 IMPLANT
SUT PROLENE 7 0 BV 1 (SUTURE) ×1 IMPLANT
SUT SILK 2 0 SH (SUTURE) IMPLANT
SUT SILK 2 0 SH CR/8 (SUTURE) ×3 IMPLANT
SUT VIC AB 2-0 CT1 27 (SUTURE) ×2
SUT VIC AB 2-0 CT1 TAPERPNT 27 (SUTURE) ×2 IMPLANT
SUT VIC AB 3-0 SH 27 (SUTURE) ×8
SUT VIC AB 3-0 SH 27X BRD (SUTURE) ×4 IMPLANT
TOWEL GREEN STERILE (TOWEL DISPOSABLE) ×3 IMPLANT
UNDERPAD 30X36 HEAVY ABSORB (UNDERPADS AND DIAPERS) ×3 IMPLANT
WATER STERILE IRR 1000ML POUR (IV SOLUTION) ×3 IMPLANT

## 2021-07-23 NOTE — Discharge Instructions (Signed)
[facility]  Discharge Instructions  AV Fistula or Graft Surgery for Dialysis Access  Please refer to the following instructions for your post-procedure care. Your surgeon or physician assistant will discuss any changes with you.  Activity  You may drive the day following your surgery, if you are comfortable and no longer taking prescription pain medication. Resume full activity as the soreness in your incision resolves.  Bathing/Showering  You may shower after you go home. Keep your incision dry for 48 hours. Do not soak in a bathtub, hot tub, or swim until the incision heals completely. You may not shower if you have a hemodialysis catheter.  Incision Care  Clean your incision with mild soap and water after 48 hours. Pat the area dry with a clean towel. You do not need a bandage unless otherwise instructed. Do not apply any ointments or creams to your incision. You may have skin glue on your incision. Do not peel it off. It will come off on its own in about one week. Your arm may swell a bit after surgery. To reduce swelling use pillows to elevate your arm so it is above your heart. Your doctor will tell you if you need to lightly wrap your arm with an ACE bandage.  Diet  Resume your normal diet. There are not special food restrictions following this procedure. In order to heal from your surgery, it is CRITICAL to get adequate nutrition. Your body requires vitamins, minerals, and protein. Vegetables are the best source of vitamins and minerals. Vegetables also provide the perfect balance of protein. Processed food has little nutritional value, so try to avoid this.  Medications  Resume taking all of your medications. If your incision is causing pain, you may take over-the counter pain relievers such as acetaminophen (Tylenol). If you were prescribed a stronger pain medication, please be aware these medications can cause nausea and constipation. Prevent  nausea by taking the medication with a snack or meal. Avoid constipation by drinking plenty of fluids and eating foods with high amount of fiber, such as fruits, vegetables, and grains.  Do not take Tylenol if you are taking prescription pain medications.  Follow up Your surgeon may want to see you in the office following your access surgery. If so, this will be arranged at the time of your surgery.  Please call us immediately for any of the following conditions:  Increased pain, redness, drainage (pus) from your incision site Fever of 101 degrees or higher Severe or worsening pain at your incision site Hand pain or numbness.  Reduce your risk of vascular disease:  Stop smoking. If you would like help, call [facility] at 1-800-QUIT-NOW 727-311-7171) or [place] at [place] your cholesterol Maintain a desired weight Control your diabetes Keep your blood pressure down  Dialysis  It will take several weeks to several months for your new dialysis access to be ready for use. Your surgeon will determine when it is okay to use it. Your nephrologist will continue to direct your dialysis. You can continue to use your Permcath until your new access is ready for use.   07/23/2021 Jacob [name] [id number] 07/30/1945  Surgeon(s): Jacob John, MD  Procedure(s): RIGHT ARM BASILIC VEIN TRANSPOSITION   May stick graft immediately   May stick graft on designated area only:   X Do not stick Right AV Fistula  for 6 weeks    If you have any questions, please call the office at  [phone]. 

## 2021-07-23 NOTE — Anesthesia Procedure Notes (Signed)
Procedure Name: Intubation Date/Time: 07/23/2021 9:55 AM  Performed by: Bryson Corona, CRNAPre-anesthesia Checklist: Patient identified, Emergency Drugs available, Suction available and Patient being monitored Patient Re-evaluated:Patient Re-evaluated prior to induction Oxygen Delivery Method: Circle System Utilized Preoxygenation: Pre-oxygenation with 100% oxygen Induction Type: IV induction Ventilation: Mask ventilation without difficulty LMA: LMA with gastric port inserted LMA Size: 5.0 Tube type: Oral Number of attempts: 2 Airway Equipment and Method: Stylet and Oral airway Placement Confirmation: ETT inserted through vocal cords under direct vision, positive ETCO2 and breath sounds checked- equal and bilateral Tube secured with: Tape Dental Injury: Teeth and Oropharynx as per pre-operative assessment  Comments: Changed LMA size 4 for size 5 LMA.

## 2021-07-23 NOTE — Transfer of Care (Signed)
Immediate Anesthesia Transfer of Care Note  Patient: Jevan Gaunt  Procedure(s) Performed: RIGHT ARM BASILIC VEIN TRANSPOSITION (Right: Arm Upper)  Patient Location: PACU  Anesthesia Type:General  Level of Consciousness: drowsy and patient cooperative  Airway & Oxygen Therapy: Patient Spontanous Breathing and Patient connected to face mask oxygen  Post-op Assessment: Report given to RN and Post -op Vital signs reviewed and stable  Post vital signs: Reviewed and stable  Last Vitals:  Vitals Value Taken Time  BP 137/85 (102) 07/23/21 12:11 PM  Temp    Pulse 75 07/23/21 1211  Resp 13 07/23/21 1211  SpO2 94 % 07/23/21 1211  Vitals shown include unvalidated device data.  Last Pain:  Vitals:   07/23/21 0813  TempSrc:   PainSc: 0-No pain         Complications: No notable events documented.

## 2021-07-23 NOTE — H&P (Signed)
Office Note   Patient seen and examined in preop holding.  No complaints. No changes to medication history or physical exam since last seen in clinic. After discussing the risks and benefits of right arm 2nd stage brachiobasilic fistula transposition, Jacob [name] elected to proceed.   [name] MD   HPI: Jacob [name] is a 76 y.o. (01/01/1946) male presenting in follow-up status post 02/19/2021 right, first stage, brachiobasilic fistula creation. 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  On exam today, Jacob [name] was doing well with no complaints.  He denied symptoms of steal syndrome in the hand. He had no wound healing concerns.    Past Medical History:  Diagnosis Date   Anemia    hx iron deficiency   Anxiety    Arthritis    gout   Back pain    BPH with obstruction/lower urinary tract symptoms    Chronic kidney disease    "they said I do"   Constipation    Depression    ED (erectile dysfunction)    Epigastric pain    GERD (gastroesophageal reflux disease)    Heartburn    History of blood transfusion    History of radiation therapy 11/03/11-12/29/11   prostate   Hypercholesterolemia    Hypertension    Night sweats    Oxygen deficiency    2x per week at night for sleep   Post-operative nausea and vomiting 04/09/2016   Prostate cancer ([place]) 08/14/2011   Adenocarcinoma,gleason:3+3=6,& 3+4=7,PSA=5.66   PUD (peptic ulcer disease)    Sleep apnea    does not use Cpap but occasionally uses O2 @ 2L qhs   Stomach cancer ([place])    Ulcer    peptic ulcer hx    Past Surgical History:  Procedure Laterality Date   AV FISTULA PLACEMENT Right 02/19/2021   Procedure: ARTERIOVENOUS (AV) FISTULA CREATION;  Surgeon: [name], MD;  Location: [facility] OR;  Service: Vascular;  Laterality: Right;   colon polyps bx  11/24/06   colon,transverse and rectosigmoid polyps:tubular adenomas and hyperplastic polyps,no high grade dysplasia or malignancy     COLONOSCOPY WITH PROPOFOL N/A 08/24/2019   Procedure: COLONOSCOPY WITH PROPOFOL;  Surgeon: [name], DO;  Location: [facility] ENDOSCOPY;  Service: Gastroenterology;  Laterality: N/A;   duodenal bx  11/24/06   benign   ESOPHAGOGASTRODUODENOSCOPY N/A 10/27/2015   Procedure: ESOPHAGOGASTRODUODENOSCOPY (EGD);  Surgeon: [name], MD;  Location: [facility];  Service: Endoscopy;  Laterality: N/A;   EUS N/A 11/08/2015   Procedure: UPPER ENDOSCOPIC ULTRASOUND (EUS) RADIAL;  Surgeon: [name], MD;  Location: [facility]COPY;  Service: Endoscopy;  Laterality: N/A;   GASTRECTOMY N/A 03/25/2016   Procedure: DISTAL GASTRECTOMY;  Surgeon: [name], MD;  Location: [facility];  Service: General;  Laterality: N/A;   gastric bx  11/24/06   chronic active gastritis,with metaplasia and focal changaes of xanthelasma   GASTROSTOMY N/A 03/25/2016   Procedure: INSERTION OF FEEDING TUBE;  Surgeon: [name], MD;  Location: [place];  Service: General;  Laterality: N/A;   INSERTION PROSTATE RADIATION SEED  12-29-11   IR GENERIC HISTORICAL  04/01/2016   IR [place] TUBE CHANGE 04/01/2016 Markus Daft, MD [facility]   IR GENERIC HISTORICAL  04/05/2016   IR [place] TUBE CHANGE 04/05/2016 Markus Daft, MD [facility]   LAPAROSCOPIC GASTRECTOMY  03/25/2016   Diagnostic laparoscopy, distal gastrectomy with Billroth 2 reconstruction and gastrojejunostomy tube   LAPAROSCOPY N/A 03/25/2016   Procedure: DIAGNOSTIC LAPAROSCOPY;  Surgeon: [name], MD;  Location: [facility] OR;  Service: General;  Laterality: N/A;   POLYPECTOMY  08/24/2019   Procedure: POLYPECTOMY;  Surgeon: [name], DO;  Location: [facility] ENDOS[facility];  Service: Gastroenterology;;   PORTACATH PLACEMENT Left 12/04/2015   Procedure: INSERTION PORT-A-CATH;  Surgeon: [name], MD;  Location: [place];  Service: General;  Laterality: Left;   PROSTATE BIOPSY  08/14/11   Adenocarcinoma/volume=58.51cc,gleason=3+3=6 & 3+4=7   TONSILLECTOMY     76 years old    Social  History   Socioeconomic History   Marital status: Married    Spouse name: [name]   Number of children: 3   Years of education: 12   Highest education level: Not on file  Occupational History   Occupation: retired    Fish farm manager: [employer]  Tobacco Use   Smoking status: Former    Packs/day: 1.50    Years: 30.00    Total pack years: 45.00    Types: Cigarettes    Quit date: 04/13/2000    Years since quitting: 21.2   Smokeless tobacco: Never  Vaping Use   Vaping Use: Never used  Substance and Sexual Activity   Alcohol use: Not Currently    Alcohol/week: 1.0 standard drink of alcohol    Types: 1 Cans of beer per week    Comment: a beer about once a week, less than in his youth   Drug use: No   Sexual activity: Not Currently  Other Topics Concern   Not on file  Social History Narrative   Patient is married [name]) and lives at home with his wife and one child.   Patient has three children.   Patient is retired.   Patient has a high school education.   Patient is ambi-dextrous.   Patient drinks two cups of coffee about three times a week.    Social Determinants of Health   Financial Resource Strain: Not on file  Food Insecurity: Not on file  Transportation Needs: Not on file  Physical Activity: Not on file  Stress: Not on file  Social Connections: Not on file  Intimate Partner Violence: Not on file   Family History  Problem Relation Age of Onset   Cancer Mother        NOS   Alcohol abuse Father    Alcohol abuse Brother 66   Cancer Paternal Aunt        NOS   Colon cancer Neg Hx     Current Facility-Administered Medications  Medication Dose Route Frequency Provider Last Rate Last Admin   0.9 %  sodium chloride infusion   Intravenous Continuous [name], MD 10 mL/hr at 07/23/21 8546 Continued from Pre-op at 07/23/21 2703   acetaminophen (TYLENOL) tablet 1,000 mg  1,000 mg Oral Once Woodrum, Chelsey L, MD       ceFAZolin (ANCEF) 2-4 GM/100ML-% IVPB             ceFAZolin (ANCEF) IVPB 2g/100 mL premix  2 g Intravenous 30 min Pre-Op [name], MD       chlorhexidine (HIBICLENS) 4 % liquid 4 Application  60 mL Topical Once [name], MD       And   [START ON 07/24/2021] chlorhexidine (HIBICLENS) 4 % liquid 4 Application  60 mL Topical Once [name], MD        Allergies  Allergen Reactions   Lisinopril Cough     REVIEW OF SYSTEMS:  '[X]'$  denotes positive finding, '[ ]'$  denotes  negative finding Cardiac  Comments:  Chest pain or chest pressure:    Shortness of breath upon exertion:    Short of breath when lying flat:    Irregular heart rhythm:        Vascular    Pain in calf, thigh, or hip brought on by ambulation:    Pain in feet at night that wakes you up from your sleep:     Blood clot in your veins:    Leg swelling:         Pulmonary    Oxygen at home:    Productive cough:     Wheezing:         Neurologic    Sudden weakness in arms or legs:     Sudden numbness in arms or legs:     Sudden onset of difficulty speaking or slurred speech:    Temporary loss of vision in one eye:     Problems with dizziness:         Gastrointestinal    Blood in stool:     Vomited blood:         Genitourinary    Burning when urinating:     Blood in urine:        Psychiatric    Major depression:         Hematologic    Bleeding problems:    Problems with blood clotting too easily:        Skin    Rashes or ulcers:        Constitutional    Fever or chills:      PHYSICAL EXAMINATION:  Vitals:   07/22/21 1751 07/23/21 0707  BP:  125/71  Pulse:  82  Resp:  18  Temp:  97.6 F (36.4 C)  TempSrc:  Oral  SpO2:  97%  Weight: 106.6 kg   Height: '6\' 2"'$  (1.88 m)     General:  WDWN in NAD; vital signs documented above Gait: Not observed HENT: WNL, normocephalic Pulmonary: normal non-labored breathing , without wheezing Cardiac: regular HR, Abdomen: soft, NT, no masses Skin: without rashes Vascular Exam/Pulses:   Right Left  Radial 2+ (normal) 2+ (normal)  Ulnar 2+ (normal) 2+ (normal)                   Extremities: without ischemic changes, without Gangrene , without cellulitis; without open wounds;  Right arm with palpable thrill in the brachiobasilic fistula Musculoskeletal: no muscle wasting or atrophy  Neurologic: A&O X 3;  No focal weakness or paresthesias are detected Psychiatric:  The pt has Normal affect.   Non-Invasive Vascular Imaging:   Summary:  Patent right first stage BVT.  Elevated velocity in the distal upper arm. at a confluence with a branch.  Narrowing of the proximal brachial artery with elevated velocity.  Ectatic appearing distal axillary vein.    ASSESSMENT/PLAN: Aldine Grainger is a 76 y.o. male presenting status post right arm for stage brachiobasilic fistula creation.  The vein has an excellent thrill, with 2 L of flow per minute.  I had a long discussion with Lona Millard regarding the above.  The fistula will need to be superficial lysed prior to use.  After discussing the risk Will work to schedule in the coming weeks.  [name], MD [facility] [phone]

## 2021-07-23 NOTE — Anesthesia Postprocedure Evaluation (Signed)
Anesthesia Post Note  Patient: Ramsey Guadamuz  Procedure(s) Performed: RIGHT ARM BASILIC VEIN TRANSPOSITION (Right: Arm Upper)     Patient location during evaluation: PACU Anesthesia Type: General Level of consciousness: awake and alert Pain management: pain level controlled Vital Signs Assessment: post-procedure vital signs reviewed and stable Respiratory status: spontaneous breathing, nonlabored ventilation, respiratory function stable and patient connected to nasal cannula oxygen Cardiovascular status: blood pressure returned to baseline and stable Postop Assessment: no apparent nausea or vomiting Anesthetic complications: no   No notable events documented.  Last Vitals:  Vitals:   07/23/21 1224 07/23/21 1239  BP: (!) 153/84 (!) 150/93  Pulse: 74 75  Resp: 13 15  Temp:  (!) 36.2 C  SpO2: 96% 93%    Last Pain:  Vitals:   07/23/21 1239  TempSrc:   PainSc: 0-No pain                 Chirsty Armistead L Deandrae Wajda

## 2021-07-23 NOTE — Op Note (Signed)
    NAME: Bocephus Cali    MRN: [id number] DOB: 09/10/1945    DATE OF OPERATION: 07/23/2021  PREOP DIAGNOSIS:    Chronic kidney disease stage V  POSTOP DIAGNOSIS:    Same  PROCEDURE:    Right upper extremity second stage brachiobasilic transposition  SURGEON: [name]  ASSIST: Paulo Fruit, PA  ANESTHESIA: General  EBL: 200 mL  INDICATIONS:    Loys Shugars is a 76 y.o. male who presents today status post 02/19/2021 right for stage brachiobasilic fistula creation.  Recent ultrasound demonstrated adequate flow, size, however the vein remains too deep.  I discussed the risk and benefits of second stage basilic vein transposition, Theresa elected to proceed.  FINDINGS:   The basilic vein had multiple, large venous branches to the brachial vein.  It entered the brachial vein early at the mid humerus, continuing as the axillary vein.  TECHNIQUE:   Patient was brought to the OR laid in supine position.  General anesthesia was induced and the patient was prepped draped in standard fashion.  The case began with ultrasound insonation of the right arm brachiobasilic fistula.  Longitudinal skip incisions x2 were made along the course of the fistula and the fistula was exposed in standard fashion.  There were multiple venous branches connecting the fistula to the deep system.  The basilic vein joined the deep system in the mid humerus, and continued into the axillary vein.  I mobilized the brachial vein and the basilic vein fed into an effort to gain length for transposition.  I elected not to ligate the final branch between the paired brachial veins, as the vein I mobilized continued as the axillary vein.    At this point, I elected to transpose the brachiobasilic fistula.  A Gore tunneler was brought onto the field and subcutaneous tunnel tract made along the medial aspect of the right arm.  The fistula was cut at a beveled angle approximately and pulled through the  newly formed tunnel using uterine forceps.  The anastomosis was made using 2, 6-0 Prolene sutures in an effort to limit narrowing.  At case completion, there was an excellent thrill throughout the fistula.  No bleeding at the anastomosis.  Triphasic signal at the wrist.  The wound was irrigated with copious amounts of saline and closed using 3-0 Vicryl suture with staples at the level of the skin.  A compression dressing was placed, and the arm was placed in a sling.   Macie Burows, MD [facility] DATE OF DICTATION:   07/23/2021

## 2021-07-24 ENCOUNTER — Encounter (HOSPITAL_COMMUNITY): Payer: Self-pay | Admitting: Vascular Surgery

## 2021-07-29 ENCOUNTER — Encounter: Payer: Self-pay | Admitting: Cardiology

## 2021-07-29 ENCOUNTER — Ambulatory Visit: Payer: Medicare Other | Admitting: Cardiology

## 2021-07-29 VITALS — BP 118/64 | HR 71 | Ht 74.0 in | Wt 239.8 lb

## 2021-07-29 DIAGNOSIS — E669 Obesity, unspecified: Secondary | ICD-10-CM | POA: Diagnosis not present

## 2021-07-29 DIAGNOSIS — R9439 Abnormal result of other cardiovascular function study: Secondary | ICD-10-CM | POA: Diagnosis not present

## 2021-07-29 DIAGNOSIS — I10 Essential (primary) hypertension: Secondary | ICD-10-CM

## 2021-07-29 DIAGNOSIS — E782 Mixed hyperlipidemia: Secondary | ICD-10-CM

## 2021-07-29 NOTE — Patient Instructions (Signed)

## 2021-07-29 NOTE — Progress Notes (Unsigned)
Cardiology Office Note:    Date:  07/30/2021   ID:  Jacob [name], DOB [birth year], MRN [id number]  PCP:  Jacob Nova, MD  Cardiologist:  Jacob Salines, DO  Electrophysiologist:  None   Referring MD: Jacob Nova, MD   " I am doing well"  History of Present Illness:    Jacob [name] is a 76 y.o. male with a hx of hypertension, obesity, history of stomach cancer, chronic kidney disease and gout is here today.   I saw the patient on November 23, 2020 at that time he was experiencing intermittent chest discomfort.  I sent the patient for a nuclear stress test.  He was able to get this testing done on December 25, 2020 which reported evidence of ischemia.    I saw the patient on January 10, 2021 at that time we talked about the abnormal stress test I started the patient on Crestor 10 mg daily, Aspirin 81 mg with Imdur 30 mg daily. During that time we talked about left heart catheterization and I wanted the patient to talk to his nephrologist first.   At his visit on February 07, 2021 the patient told me that he had discussed with his nephrology who preferred that we hold off on proceeding with heart catheterization which actually was appropriate given the fact that he was worsening kidney failure and contrast would speed the process up.  At his visit on March 27, 2021 he was doing well on his medical therapy.  He had had his fistula implanted.  Past Medical History:  Diagnosis Date   Anemia    hx iron deficiency   Anxiety    Arthritis    gout   Back pain    BPH with obstruction/lower urinary tract symptoms    Chronic kidney disease    "they said I do"   Constipation    Depression    ED (erectile dysfunction)    Epigastric pain    GERD (gastroesophageal reflux disease)    Heartburn    History of blood transfusion    History of radiation therapy 11/03/11-12/29/11   prostate   Hypercholesterolemia    Hypertension    Night sweats    Oxygen deficiency     2x per week at night for sleep   Post-operative nausea and vomiting 04/09/2016   Prostate cancer ([place]) 08/14/2011   Adenocarcinoma,gleason:3+3=6,& 3+4=7,PSA=5.66   PUD (peptic ulcer disease)    Sleep apnea    does not use Cpap but occasionally uses O2 @ 2L qhs   Stomach cancer ([place])    Ulcer    peptic ulcer hx    Past Surgical History:  Procedure Laterality Date   AV FISTULA PLACEMENT Right 02/19/2021   Procedure: ARTERIOVENOUS (AV) FISTULA CREATION;  Surgeon: [name], MD;  Location: [facility];  Service: Vascular;  Laterality: Right;   [place] Right 07/23/2021   Procedure: RIGHT ARM BASILIC VEIN TRANSPOSITION;  Surgeon: [name], MD;  Location: [facility];  Service: Vascular;  Laterality: Right;  PERIPHERAL NERVE BLOCK   colon polyps bx  11/24/06   colon,transverse and rectosigmoid polyps:tubular adenomas and hyperplastic polyps,no high grade dysplasia or malignancy    COLONOSCOPY WITH PROPOFOL N/A 08/24/2019   Procedure: COLONOSCOPY WITH PROPOFOL;  Surgeon: [name], DO;  Location: [facility];  Service: Gastroenterology;  Laterality: N/A;   duodenal bx  11/24/06   benign   ESOPHAGOGASTRODUODENOSCOPY N/A 10/27/2015   Procedure: ESOPHAGOGASTRODUODENOSCOPY (EGD);  Surgeon: Ofilia Neas  Carlean Purl, MD;  Location: [facility];  Service: Endoscopy;  Laterality: N/A;   EUS N/A 11/08/2015   Procedure: UPPER ENDOSCOPIC ULTRASOUND (EUS) RADIAL;  Surgeon: [name], MD;  Location: [facility];  Service: Endoscopy;  Laterality: N/A;   GASTRECTOMY N/A 03/25/2016   Procedure: DISTAL GASTRECTOMY;  Surgeon: [name], MD;  Location: [place];  Service: General;  Laterality: N/A;   gastric bx  11/24/06   chronic active gastritis,with metaplasia and focal changaes of xanthelasma   GASTROSTOMY N/A 03/25/2016   Procedure: INSERTION OF FEEDING TUBE;  Surgeon: [name], MD;  Location: [facility];  Service: General;  Laterality: N/A;   INSERTION PROSTATE RADIATION SEED   12-29-11   IR GENERIC HISTORICAL  04/01/2016   IR [place] TUBE CHANGE 04/01/2016 Markus Daft, MD [facility]   IR GENERIC HISTORICAL  04/05/2016   IR [place] TUBE CHANGE 04/05/2016 Markus Daft, MD [facility]   LAPAROSCOPIC GASTRECTOMY  03/25/2016   Diagnostic laparoscopy, distal gastrectomy with Billroth 2 reconstruction and gastrojejunostomy tube   LAPAROSCOPY N/A 03/25/2016   Procedure: DIAGNOSTIC LAPAROSCOPY;  Surgeon: [name], MD;  Location: [place];  Service: General;  Laterality: N/A;   POLYPECTOMY  08/24/2019   Procedure: POLYPECTOMY;  Surgeon: [name], DO;  Location: [facility];  Service: Gastroenterology;;   PORTACATH PLACEMENT Left 12/04/2015   Procedure: INSERTION PORT-A-CATH;  Surgeon: [name], MD;  Location: [facility];  Service: General;  Laterality: Left;   PROSTATE BIOPSY  08/14/11   Adenocarcinoma/volume=58.51cc,gleason=3+3=6 & 3+4=7   TONSILLECTOMY     76 years old    Current Medications: Current Meds  Medication Sig   acetaminophen (TYLENOL) 500 MG tablet Take 1,000 mg by mouth daily as needed for moderate pain or headache.   allopurinol (ZYLOPRIM) 300 MG tablet Take 150 mg by mouth daily.   aspirin EC 81 MG tablet Take 1 tablet (81 mg total) by mouth daily. Swallow whole.   buPROPion (WELLBUTRIN XL) 300 MG 24 hr tablet Take 300 mg by mouth daily.    cetirizine (ZYRTEC) 10 MG tablet Take 10 mg by mouth daily.   Cholecalciferol (VITAMIN D3) 10 MCG (400 UNIT) CAPS Take 400 mg by mouth daily.   donepezil (ARICEPT) 5 MG tablet Take 1 tablet (5 mg total) by mouth at bedtime.   furosemide (LASIX) 40 MG tablet Take 40 mg by mouth daily.   hydrALAZINE (APRESOLINE) 50 MG tablet Take 50 mg by mouth 3 (three) times daily.   Melatonin 10 MG TABS Take 10 mg by mouth at bedtime.   Nebivolol HCl 20 MG TABS Take 20 mg by mouth at bedtime.   oxyCODONE-acetaminophen (PERCOCET) 5-325 MG tablet Take 1 tablet by mouth every 4 (four) hours as needed for severe pain.   rosuvastatin  (CRESTOR) 5 MG tablet Take 5 mg by mouth daily.   sodium bicarbonate 650 MG tablet Take 650 mg by mouth 2 (two) times daily.   sodium zirconium cyclosilicate (LOKELMA) 10 g PACK packet Take 10 g by mouth every other day.   tamsulosin (FLOMAX) 0.4 MG CAPS capsule Take 1 capsule by mouth every day   venlafaxine XR (EFFEXOR-XR) 75 MG 24 hr capsule Take 75 mg by mouth daily.   verapamil (CALAN) 80 MG tablet Take 80 mg by mouth 3 (three) times daily.      Allergies:   Lisinopril   Social History   Socioeconomic History   Marital status: Married    Spouse name: Jacob [name]   Number of children: 3  Years of education: 90   Highest education level: Not on file  Occupational History   Occupation: retired    Fish farm manager: [employer]  Tobacco Use   Smoking status: Former    Packs/day: 1.50    Years: 30.00    Total pack years: 45.00    Types: Cigarettes    Quit date: 04/13/2000    Years since quitting: 21.3   Smokeless tobacco: Never  Vaping Use   Vaping Use: Never used  Substance and Sexual Activity   Alcohol use: Not Currently    Alcohol/week: 1.0 standard drink of alcohol    Types: 1 Cans of beer per week    Comment: a beer about once a week, less than in his youth   Drug use: No   Sexual activity: Not Currently  Other Topics Concern   Not on file  Social History Narrative   Patient is married Jacob [name]) and lives at home with his wife and one child.   Patient has three children.   Patient is retired.   Patient has a high school education.   Patient is ambi-dextrous.   Patient drinks two cups of coffee about three times a week.    Social Determinants of Health   Financial Resource Strain: Not on file  Food Insecurity: Not on file  Transportation Needs: Not on file  Physical Activity: Not on file  Stress: Not on file  Social Connections: Not on file     Family History: The patient's family history includes Alcohol abuse in his father; Alcohol abuse (age of onset: 34) in his  brother; Cancer in his mother and paternal aunt. There is no history of Colon cancer.  ROS:   Review of Systems  Constitution: Negative for decreased appetite, fever and weight gain.  HENT: Negative for congestion, ear discharge, hoarse voice and sore throat.   Eyes: Negative for discharge, redness, vision loss in right eye and visual halos.  Cardiovascular: Negative for chest pain, dyspnea on exertion, leg swelling, orthopnea and palpitations.  Respiratory: Negative for cough, hemoptysis, shortness of breath and snoring.   Endocrine: Negative for heat intolerance and polyphagia.  Hematologic/Lymphatic: Negative for bleeding problem. Does not bruise/bleed easily.  Skin: Negative for flushing, nail changes, rash and suspicious lesions.  Musculoskeletal: Negative for arthritis, joint pain, muscle cramps, myalgias, neck pain and stiffness.  Gastrointestinal: Negative for abdominal pain, bowel incontinence, diarrhea and excessive appetite.  Genitourinary: Negative for decreased libido, genital sores and incomplete emptying.  Neurological: Negative for brief paralysis, focal weakness, headaches and loss of balance.  Psychiatric/Behavioral: Negative for altered mental status, depression and suicidal ideas.  Allergic/Immunologic: Negative for HIV exposure and persistent infections.    EKGs/Labs/Other Studies Reviewed:    The following studies were reviewed today:   EKG:  The ekg ordered today demonstrates   Recent Labs: 02/16/2021: Magnesium 2.2; TSH 1.616 06/26/2021: ALT 14; Platelet Count 181 07/23/2021: BUN 49; Creatinine, Ser 5.00; Hemoglobin 8.5; Potassium 4.4; Sodium 138  Recent Lipid Panel    Component Value Date/Time   CHOL 76 02/16/2021 1814   TRIG 91 02/16/2021 1814   HDL 24 (L) 02/16/2021 1814   CHOLHDL 3.2 02/16/2021 1814   VLDL 18 02/16/2021 1814   LDLCALC 34 02/16/2021 1814    Physical Exam:    VS:  BP 118/64 (BP Location: Left Arm, Patient Position: Sitting, Cuff Size:  Large)   Pulse 71   Ht '6\' 2"'$  (1.88 m)   Wt 239 lb 12.8 oz (108.8 kg)  SpO2 96%   BMI 30.79 kg/m     Wt Readings from Last 3 Encounters:  07/29/21 239 lb 12.8 oz (108.8 kg)  07/22/21 235 lb (106.6 kg)  07/12/21 232 lb (105.2 kg)     GEN: Well nourished, well developed in no acute distress HEENT: Normal NECK: No JVD; No carotid bruits LYMPHATICS: No lymphadenopathy CARDIAC: S1S2 noted,RRR, no murmurs, rubs, gallops RESPIRATORY:  Clear to auscultation without rales, wheezing or rhonchi  ABDOMEN: Soft, non-tender, non-distended, +bowel sounds, no guarding. EXTREMITIES: No edema, No cyanosis, no clubbing MUSCULOSKELETAL:  No deformity  SKIN: Warm and dry NEUROLOGIC:  Alert and oriented x 3, non-focal PSYCHIATRIC:  Normal affect, good insight  ASSESSMENT:    1. Essential hypertension   2. Abnormal stress test   3. Mixed hyperlipidemia   4. Obesity (BMI 30-39.9)    PLAN:    He appears to be doing well from a cardiovascular standpoint.  He no change in medication.  He still prefers medical option over any invasive testing.  Blood pressure is acceptable, continue with current antihypertensive regimen.    The patient is in agreement with the above plan. The patient left the office in stable condition.  The patient will follow up in   Medication Adjustments/Labs and Tests Ordered: Current medicines are reviewed at length with the patient today.  Concerns regarding medicines are outlined above.  No orders of the defined types were placed in this encounter.  No orders of the defined types were placed in this encounter.   Patient Instructions  Medication Instructions:  Your physician recommends that you continue on your current medications as directed. Please refer to the Current Medication list given to you today.  *If you need a refill on your cardiac medications before your next appointment, please call your pharmacy*   Lab Work: None If you have labs (blood work)  drawn today and your tests are completely normal, you will receive your results only by: [place] (if you have MyChart) OR A paper copy in the mail If you have any lab test that is abnormal or we need to change your treatment, we will call you to review the results.   Testing/Procedures: None   Follow-Up: At [facility], you and your health needs are our priority.  As part of our continuing mission to provide you with exceptional heart care, we have created designated Provider Care Teams.  These Care Teams include your primary Cardiologist (physician) and Advanced Practice Providers (APPs -  Physician Assistants and Nurse Practitioners) who all work together to provide you with the care you need, when you need it.  We recommend signing up for the patient portal called "MyChart".  Sign up information is provided on this After Visit Summary.  MyChart is used to connect with patients for Virtual Visits (Telemedicine).  Patients are able to view lab/test results, encounter notes, upcoming appointments, etc.  Non-urgent messages can be sent to your provider as well.   To learn more about what you can do with MyChart, go to [email or web address].    Your next appointment:   6 month(s)  The format for your next appointment:   In Person  Provider:   Berniece Salines, DO     Other Instructions   Important Information About Sugar         Adopting a Healthy Lifestyle.  Know what a healthy weight is for you (roughly BMI <25) and aim to maintain this   Aim for 7+ servings of fruits and  vegetables daily   65-80+ fluid ounces of water or unsweet tea for healthy kidneys   Limit to max 1 drink of alcohol per day; avoid smoking/tobacco   Limit animal fats in diet for cholesterol and heart health - choose grass fed whenever available   Avoid highly processed foods, and foods high in saturated/trans fats   Aim for low stress - take time to unwind and care for your mental health    Aim for 150 min of moderate intensity exercise weekly for heart health, and weights twice weekly for bone health   Aim for 7-9 hours of sleep daily   When it comes to diets, agreement about the perfect plan isnt easy to find, even among the experts. Experts at the [place] developed an idea known as the Healthy Eating Plate. Just imagine a plate divided into logical, healthy portions.   The emphasis is on diet quality:   Load up on vegetables and fruits - one-half of your plate: Aim for color and variety, and remember that potatoes dont count.   Go for whole grains - one-quarter of your plate: Whole wheat, barley, wheat berries, quinoa, oats, brown rice, and foods made with them. If you want pasta, go with whole wheat pasta.   Protein power - one-quarter of your plate: Fish, chicken, beans, and nuts are all healthy, versatile protein sources. Limit red meat.   The diet, however, does go beyond the plate, offering a few other suggestions.   Use healthy plant oils, such as olive, canola, soy, corn, sunflower and peanut. Check the labels, and avoid partially hydrogenated oil, which have unhealthy trans fats.   If youre thirsty, drink water. Coffee and tea are good in moderation, but skip sugary drinks and limit milk and dairy products to one or two daily servings.   The type of carbohydrate in the diet is more important than the amount. Some sources of carbohydrates, such as vegetables, fruits, whole grains, and beans-are healthier than others.   Finally, stay active  Signed, Jacob Salines, DO  07/30/2021 8:59 AM    [place]

## 2021-08-05 NOTE — Progress Notes (Signed)
    Postoperative Access Visit   History of Present Illness   Rik Heskett is a 76 y.o. year old male who presents for postoperative follow-up for: Right upper extremity second stage brachiobasilic transposition 2/42/35 by Dr. Virl Cagey.  The patient's wounds are slowly healing. He has not noticed any drainage or bleeding from the incision. The patient notes no steal symptoms.  He does report some numbness along incision line. He is not currently on HD  Physical Examination   Vitals:   08/09/21 1017  BP: 125/78  Pulse: 88  Resp: 16  Temp: (!) 97.2 F (36.2 C)  TempSrc: Temporal  Weight: 229 lb (103.9 kg)  Height: '6\' 2"'$  (1.88 m)  PF: 96 L/min   Body mass index is 29.4 kg/m.  right arm Incisions are healing well. The proximal aspect of the more proximal incision in the right axilla has some separation and fibrinous exudate. I removed some of the staples in the more proximal incision. Applied dry gauze. The distal incision is healing well. Staples all removed. 1+ radial pulse, hand grip is 5/5, sensation in digits is intact, palpable thrill, bruit can be auscultated     Medical Decision Making   Travanti Schroeter is a 76 y.o. year old male who presents s/p Right upper extremity second stage brachiobasilic transposition 3/61/44 by Dr. Virl Cagey. The proximal aspect of the more proximal incision in the right axilla has some separation and fibrinous exudate. Does not appear infected at this time. I removed some of the staples in the more proximal incision. Applied dry gauze. The distal incision is healing well. Staples all removed. He is not currently on Hemodialysis.  Patent is without signs or symptoms of steal syndrome The patient's access will be ready for use after 08/23/21 I will have him return in 1 week for close follow up of the incision and removal of remaining staples. There are no APP's in the office so I will arrange for him to return to see Dr. Caralee Ates,  PA-C [facility] Office: [phone]  Clinic MD: Virl Cagey

## 2021-08-09 ENCOUNTER — Ambulatory Visit (INDEPENDENT_AMBULATORY_CARE_PROVIDER_SITE_OTHER): Payer: Medicare Other | Admitting: Physician Assistant

## 2021-08-09 VITALS — BP 125/78 | HR 88 | Temp 97.2°F | Resp 16 | Ht 74.0 in | Wt 229.0 lb

## 2021-08-09 DIAGNOSIS — N185 Chronic kidney disease, stage 5: Secondary | ICD-10-CM

## 2021-08-11 ENCOUNTER — Other Ambulatory Visit: Payer: Self-pay | Admitting: Urology

## 2021-08-15 NOTE — Progress Notes (Signed)
    Postoperative Access Visit   History of Present Illness   Jacob [name] is a 76 y.o. year old male who presents for postoperative follow-up for: Right upper extremity second stage brachiobasilic transposition 01/29/55.    At his last visit, staples at at the antecubital fossa were removed, staples in the axilla were left due to some exudate.  He has not noticed any drainage or bleeding from the incision. The patient notes no steal symptoms.  He does report some numbness along incision line. He is not currently on HD  Physical Examination   There were no vitals filed for this visit.  There is no height or weight on file to calculate BMI.  right arm Incisions are healing well. The distal incision is healing well.  No evidence appreciated.  Staples all removed. 1+ radial pulse, hand grip is 5/5, sensation in digits is intact, palpable thrill, bruit can be auscultated     Medical Decision Making   Jacob [name] is a 76 y.o. year old male who presents s/p Right upper extremity second stage brachiobasilic transposition 09/15/81 by Dr. Virl Cagey.   Incision healed.  Staples removed.  Excellent thrill in the fistula. Patent is without signs or symptoms of steal syndrome The patient's access will be ready for use after 08/23/21 Can follow-up with me as needed   Jacob [name],  [facility] Office: [phone]

## 2021-08-16 ENCOUNTER — Ambulatory Visit (INDEPENDENT_AMBULATORY_CARE_PROVIDER_SITE_OTHER): Payer: Medicare Other | Admitting: Vascular Surgery

## 2021-08-16 ENCOUNTER — Encounter: Payer: Self-pay | Admitting: Vascular Surgery

## 2021-08-16 VITALS — BP 138/79 | HR 79 | Temp 98.0°F | Resp 20 | Ht 74.0 in | Wt 229.0 lb

## 2021-08-16 DIAGNOSIS — N183 Chronic kidney disease, stage 3 unspecified: Secondary | ICD-10-CM

## 2021-10-18 ENCOUNTER — Encounter: Payer: Self-pay | Admitting: Oncology

## 2021-10-23 ENCOUNTER — Encounter: Payer: Self-pay | Admitting: Gastroenterology

## 2021-11-18 DIAGNOSIS — H401190 Primary open-angle glaucoma, unspecified eye, stage unspecified: Secondary | ICD-10-CM | POA: Insufficient documentation

## 2021-11-18 DIAGNOSIS — I872 Venous insufficiency (chronic) (peripheral): Secondary | ICD-10-CM | POA: Insufficient documentation

## 2021-12-03 ENCOUNTER — Telehealth: Payer: Self-pay | Admitting: *Deleted

## 2021-12-03 NOTE — Telephone Encounter (Signed)
PC to patient, no answer, left VM - informed patient his CT scan has been authorized by insurance & he may schedule it at any time by [employer], [phone].  Patient instructed to call this office with any questions/concerns, number given.

## 2021-12-10 ENCOUNTER — Telehealth: Payer: Self-pay | Admitting: *Deleted

## 2021-12-10 NOTE — Telephone Encounter (Signed)
PC to patient, informed him his CT scan has been authorized by insurance & may be scheduled by [employer], [phone].  He verbalizes understanding & will schedule.

## 2021-12-11 ENCOUNTER — Ambulatory Visit: Payer: Medicare Other | Admitting: Podiatry

## 2021-12-11 DIAGNOSIS — B351 Tinea unguium: Secondary | ICD-10-CM

## 2021-12-11 DIAGNOSIS — M21612 Bunion of left foot: Secondary | ICD-10-CM

## 2021-12-11 DIAGNOSIS — M79674 Pain in right toe(s): Secondary | ICD-10-CM

## 2021-12-11 DIAGNOSIS — M79675 Pain in left toe(s): Secondary | ICD-10-CM

## 2021-12-11 DIAGNOSIS — M2012 Hallux valgus (acquired), left foot: Secondary | ICD-10-CM | POA: Diagnosis not present

## 2021-12-12 NOTE — Progress Notes (Signed)
  Subjective:  Patient ID: Jacob [name], male    DOB: 05-06-1945,  MRN: [id number]  Chief Complaint  Patient presents with   Nail Problem    Thick painful toenails    76 y.o. male presents with the above complaint. History confirmed with patient.  He would like to know with his large bump is near his big toe is getting bigger does not hurt  Objective:  Physical Exam: warm, good capillary refill, no trophic changes or ulcerative lesions, normal DP and PT pulses and normal sensory exam.  Thickened elongated mycotic yellow-brown discoloration of all toenails Left Foot: Hallux valgus deformity nontender good range of motion of joint  Assessment:   1. Hallux valgus with bunions, left   2. Pain due to onychomycosis of toenails of both feet      Plan:  Patient was evaluated and treated and all questions answered.  Discussed the etiology and treatment options for the condition in detail with the patient. Educated patient on the topical and oral treatment options for mycotic nails. Recommended debridement of the nails today. Sharp and mechanical debridement performed of all painful and mycotic nails today. Nails debrided in length and thickness using a nail nipper to level of comfort. Discussed treatment options including appropriate shoe gear. Follow up as needed for painful nails.   We discussed etiology options of hallux valgus deformity and that most of this is congenital.  Currently is asymptomatic for him.  I recommended nonoperative treatment with wider shoe gear and avoiding pressure on this area.  He will return to see me further if this worsens.  Return if symptoms worsen or fail to improve.

## 2021-12-17 ENCOUNTER — Encounter: Payer: Medicare Other | Admitting: Gastroenterology

## 2021-12-18 ENCOUNTER — Other Ambulatory Visit: Payer: Medicare Other

## 2021-12-23 ENCOUNTER — Ambulatory Visit (HOSPITAL_COMMUNITY)
Admission: RE | Admit: 2021-12-23 | Discharge: 2021-12-23 | Disposition: A | Discharge status: Home / Self Care | Payer: Medicare Other | Source: Ambulatory Visit | Attending: Oncology | Admitting: Oncology

## 2021-12-23 ENCOUNTER — Telehealth: Payer: Self-pay

## 2021-12-23 ENCOUNTER — Inpatient Hospital Stay: Payer: Medicare Other | Attending: Oncology

## 2021-12-23 ENCOUNTER — Other Ambulatory Visit: Payer: Self-pay

## 2021-12-23 DIAGNOSIS — Z79899 Other long term (current) drug therapy: Secondary | ICD-10-CM | POA: Insufficient documentation

## 2021-12-23 DIAGNOSIS — Z888 Allergy status to other drugs, medicaments and biological substances status: Secondary | ICD-10-CM | POA: Insufficient documentation

## 2021-12-23 DIAGNOSIS — M898X9 Other specified disorders of bone, unspecified site: Secondary | ICD-10-CM | POA: Insufficient documentation

## 2021-12-23 DIAGNOSIS — C61 Malignant neoplasm of prostate: Secondary | ICD-10-CM | POA: Diagnosis present

## 2021-12-23 DIAGNOSIS — C169 Malignant neoplasm of stomach, unspecified: Secondary | ICD-10-CM | POA: Insufficient documentation

## 2021-12-23 DIAGNOSIS — D631 Anemia in chronic kidney disease: Secondary | ICD-10-CM | POA: Insufficient documentation

## 2021-12-23 DIAGNOSIS — N186 End stage renal disease: Secondary | ICD-10-CM | POA: Insufficient documentation

## 2021-12-23 DIAGNOSIS — M549 Dorsalgia, unspecified: Secondary | ICD-10-CM | POA: Insufficient documentation

## 2021-12-23 DIAGNOSIS — Z8546 Personal history of malignant neoplasm of prostate: Secondary | ICD-10-CM | POA: Insufficient documentation

## 2021-12-23 DIAGNOSIS — D472 Monoclonal gammopathy: Secondary | ICD-10-CM | POA: Insufficient documentation

## 2021-12-23 DIAGNOSIS — Z923 Personal history of irradiation: Secondary | ICD-10-CM | POA: Insufficient documentation

## 2021-12-23 DIAGNOSIS — Z992 Dependence on renal dialysis: Secondary | ICD-10-CM | POA: Insufficient documentation

## 2021-12-23 DIAGNOSIS — G8929 Other chronic pain: Secondary | ICD-10-CM | POA: Insufficient documentation

## 2021-12-23 LAB — CMP (CANCER CENTER ONLY)
ALT: 6 U/L (ref 0–44)
AST: 11 U/L — ABNORMAL LOW (ref 15–41)
Albumin: 3.7 g/dL (ref 3.5–5.0)
Alkaline Phosphatase: 47 U/L (ref 38–126)
Anion gap: 6 (ref 5–15)
BUN: 62 mg/dL — ABNORMAL HIGH (ref 8–23)
CO2: 28 mmol/L (ref 22–32)
Calcium: 10.1 mg/dL (ref 8.9–10.3)
Chloride: 104 mmol/L (ref 98–111)
Creatinine: 5.49 mg/dL (ref 0.61–1.24)
GFR, Estimated: 10 mL/min — ABNORMAL LOW (ref >60)
Glucose, Bld: 141 mg/dL — ABNORMAL HIGH (ref 70–99)
Potassium: 4.7 mmol/L (ref 3.5–5.1)
Sodium: 138 mmol/L (ref 135–145)
Total Bilirubin: 0.3 mg/dL (ref 0.3–1.2)
Total Protein: 6.8 g/dL (ref 6.5–8.1)

## 2021-12-23 LAB — CBC WITH DIFFERENTIAL (CANCER CENTER ONLY)
Abs Immature Granulocytes: 0.01 10*3/uL (ref 0.00–0.07)
Basophils Absolute: 0.1 10*3/uL (ref 0.0–0.1)
Basophils Relative: 1 %
Eosinophils Absolute: 0.3 10*3/uL (ref 0.0–0.5)
Eosinophils Relative: 5 %
HCT: 27.9 % — ABNORMAL LOW (ref 39.0–52.0)
Hemoglobin: 8.9 g/dL — ABNORMAL LOW (ref 13.0–17.0)
Immature Granulocytes: 0 %
Lymphocytes Relative: 19 %
Lymphs Abs: 1.2 10*3/uL (ref 0.7–4.0)
MCH: 31.9 pg (ref 26.0–34.0)
MCHC: 31.9 g/dL (ref 30.0–36.0)
MCV: 100 fL (ref 80.0–100.0)
Monocytes Absolute: 0.5 10*3/uL (ref 0.1–1.0)
Monocytes Relative: 7 %
Neutro Abs: 4.3 10*3/uL (ref 1.7–7.7)
Neutrophils Relative %: 68 %
Platelet Count: 185 10*3/uL (ref 150–400)
RBC: 2.79 MIL/uL — ABNORMAL LOW (ref 4.22–5.81)
RDW: 15.2 % (ref 11.5–15.5)
WBC Count: 6.4 10*3/uL (ref 4.0–10.5)
nRBC: 0 % (ref 0.0–0.2)

## 2021-12-23 NOTE — Telephone Encounter (Signed)
CRITICAL VALUE STICKER  CRITICAL VALUE: Creatinine 5.49  RECEIVER (on-site recipient of call): Nonie Lochner P. LPN  DATE & TIME NOTIFIED: 12/23/21 3:48 pm  MESSENGER (representative from lab): Pam  MD NOTIFIED:  Dede Query, PA  TIME OF NOTIFICATION: 3:54 PM  RESPONSE:   Patient is stable.

## 2021-12-24 LAB — KAPPA/LAMBDA LIGHT CHAINS
Kappa free light chain: 240 mg/L — ABNORMAL HIGH (ref 3.3–19.4)
Kappa, lambda light chain ratio: 3.11 — ABNORMAL HIGH (ref 0.26–1.65)
Lambda free light chains: 77.1 mg/L — ABNORMAL HIGH (ref 5.7–26.3)

## 2021-12-25 ENCOUNTER — Other Ambulatory Visit: Payer: Self-pay

## 2021-12-25 ENCOUNTER — Inpatient Hospital Stay: Payer: Medicare Other | Admitting: Oncology

## 2021-12-25 VITALS — BP 134/98 | HR 74 | Temp 97.5°F | Resp 17 | Ht 74.0 in | Wt 220.4 lb

## 2021-12-25 DIAGNOSIS — Z992 Dependence on renal dialysis: Secondary | ICD-10-CM | POA: Diagnosis not present

## 2021-12-25 DIAGNOSIS — Z923 Personal history of irradiation: Secondary | ICD-10-CM | POA: Diagnosis not present

## 2021-12-25 DIAGNOSIS — N186 End stage renal disease: Secondary | ICD-10-CM | POA: Diagnosis not present

## 2021-12-25 DIAGNOSIS — M549 Dorsalgia, unspecified: Secondary | ICD-10-CM | POA: Diagnosis not present

## 2021-12-25 DIAGNOSIS — M898X9 Other specified disorders of bone, unspecified site: Secondary | ICD-10-CM | POA: Diagnosis not present

## 2021-12-25 DIAGNOSIS — Z8546 Personal history of malignant neoplasm of prostate: Secondary | ICD-10-CM | POA: Diagnosis not present

## 2021-12-25 DIAGNOSIS — C169 Malignant neoplasm of stomach, unspecified: Secondary | ICD-10-CM | POA: Diagnosis present

## 2021-12-25 DIAGNOSIS — N189 Chronic kidney disease, unspecified: Secondary | ICD-10-CM

## 2021-12-25 DIAGNOSIS — Z888 Allergy status to other drugs, medicaments and biological substances status: Secondary | ICD-10-CM | POA: Diagnosis not present

## 2021-12-25 DIAGNOSIS — D631 Anemia in chronic kidney disease: Secondary | ICD-10-CM | POA: Diagnosis not present

## 2021-12-25 DIAGNOSIS — Z79899 Other long term (current) drug therapy: Secondary | ICD-10-CM | POA: Diagnosis not present

## 2021-12-25 DIAGNOSIS — D472 Monoclonal gammopathy: Secondary | ICD-10-CM

## 2021-12-25 DIAGNOSIS — G8929 Other chronic pain: Secondary | ICD-10-CM | POA: Diagnosis not present

## 2021-12-25 NOTE — Progress Notes (Signed)
Hematology and Oncology Follow Up Visit  Jacob [name] [id number] July 17, 1945 76 y.o. 12/25/2021 9:52 AM Jacob [name], MDShah, Jacob Forest, MD   Principle Diagnosis:  76 year old man with gastric adenocarcinoma diagnosed in 2017.  He was found to have T3N0 disease.  Secondary diagnoses:   1.  Stage T1c prostate cancer diagnosed in 2013.  He presented with Gleason score 3+4 = 7 and a PSA 5.66 and currently on active surveillance.  2.  IgG lambda MGUS diagnosed in 2017 there is currently on active surveillance without any indication for treatment.  3.  Anemia of chronic kidney disease.  Prior Therapy:  He is status post radiation therapy for definitive treatment for his prostate cancer. Therapy concluded in December 2013. He is status post packed red cell transfusions in April 2017. He is status post bone marrow biopsy in April 2017. Results did not show any plasma cell disorder or myelodysplasia. He is status post IV iron infusion completed in April 2017. This was repeated in September 2017. FOLFOX chemotherapy with cycle 1 to be given on 12/12/2015.  He is S/P cycle 5 of therapy given on 02/20/2016. He is status post distal gastrectomy and Billroth II reconstruction completed on 03/25/2016. The final pathology revealed a T3 N0 residual tumor without any lymphadenopathy involved.  Current therapy: Active surveillance.   Interim History:  Jacob [name] returns today for repeat follow-up.  Since the last visit, he reports no major changes in his health.  He has reported decline in his performance status although did not require dialysis at this time.  Fistula is in place although has not required initiation of dialysis.  He has reported chronic back pain and bone pain which has not changed at this time.  He denies any recent falls or syncope.  He is still able to ambulate short distances.     Medications: Updated on review. Current Outpatient Medications  Medication Sig  Dispense Refill   acetaminophen (TYLENOL) 500 MG tablet Take 1,000 mg by mouth daily as needed for moderate pain or headache.     allopurinol (ZYLOPRIM) 300 MG tablet Take 150 mg by mouth daily.     aspirin EC 81 MG tablet Take 1 tablet (81 mg total) by mouth daily. Swallow whole. 90 tablet 3   buPROPion (WELLBUTRIN XL) 300 MG 24 hr tablet Take 300 mg by mouth daily.      calcium carbonate (TUMS - DOSED IN MG ELEMENTAL CALCIUM) 500 MG chewable tablet Chew 3 tablets by mouth 2 (two) times daily as needed for indigestion or heartburn.     cetirizine (ZYRTEC) 10 MG tablet Take 10 mg by mouth daily.     Cholecalciferol (VITAMIN D3) 10 MCG (400 UNIT) CAPS Take 400 mg by mouth daily.     diclofenac Sodium (VOLTAREN) 1 % GEL Apply 2 g topically 4 (four) times daily. 100 g 1   donepezil (ARICEPT) 5 MG tablet Take 1 tablet (5 mg total) by mouth at bedtime. 30 tablet 6   ferrous sulfate 325 (65 FE) MG tablet Take 325 mg by mouth daily with breakfast.     furosemide (LASIX) 40 MG tablet Take 40 mg by mouth daily.     hydrALAZINE (APRESOLINE) 50 MG tablet Take 50 mg by mouth 3 (three) times daily.     lidocaine (XYLOCAINE) 5 % ointment Apply 1 application topically as needed. 35.44 g 0   Melatonin 10 MG TABS Take 10 mg by mouth at bedtime.     Nebivolol  HCl 20 MG TABS Take 20 mg by mouth at bedtime.     oxyCODONE-acetaminophen (PERCOCET) 5-325 MG tablet Take 1 tablet by mouth every 4 (four) hours as needed for severe pain. 24 tablet 0   pantoprazole (PROTONIX) 40 MG tablet Take 1 tablet (40 mg total) by mouth daily before supper. 30 tablet 0   rosuvastatin (CRESTOR) 5 MG tablet Take 5 mg by mouth daily.     sodium bicarbonate 650 MG tablet Take 650 mg by mouth 2 (two) times daily.     sodium zirconium cyclosilicate (LOKELMA) 10 g PACK packet Take 10 g by mouth every other day.     tamsulosin (FLOMAX) 0.4 MG CAPS capsule Take 1 capsule by mouth every day 30 capsule 11   venlafaxine XR (EFFEXOR-XR) 75 MG 24  hr capsule Take 75 mg by mouth daily.     verapamil (CALAN) 80 MG tablet Take 80 mg by mouth 3 (three) times daily.      No current facility-administered medications for this visit.     Allergies:  Allergies  Allergen Reactions   Lisinopril Cough       Physical Exam:     Blood pressure (!) 134/98, pulse 74, temperature (!) 97.5 F (36.4 C), temperature source Temporal, resp. rate 17, height _0  (1.88 m), weight 220 lb 6.4 oz (100 kg), SpO2 100 %.     ECOG: 1    General appearance: Alert, awake without any distress. Head: Atraumatic without abnormalities Oropharynx: Without any thrush or ulcers. Eyes: No scleral icterus. Lymph nodes: No lymphadenopathy noted in the cervical, supraclavicular, or axillary nodes Heart:regular rate and rhythm, without any murmurs or gallops.   Lung: Clear to auscultation without any rhonchi, wheezes or dullness to percussion. Abdomin: Soft, nontender without any shifting dullness or ascites. Musculoskeletal: No clubbing or cyanosis. Neurological: No motor or sensory deficits. Skin: No rashes or lesions.       Lab Results: Lab Results  Component Value Date   WBC 6.4 12/23/2021   HGB 8.9 (L) 12/23/2021   HCT 27.9 (L) 12/23/2021   MCV 100.0 12/23/2021   PLT 185 12/23/2021     Chemistry      Component Value Date/Time   NA 138 12/23/2021 1503   NA 141 01/16/2017 0924   K 4.7 12/23/2021 1503   K 4.8 01/16/2017 0924   CL 104 12/23/2021 1503   CO2 28 12/23/2021 1503   CO2 25 01/16/2017 0924   BUN 62 (H) 12/23/2021 1503   BUN 24.0 01/16/2017 0924   CREATININE 5.49 (HH) 12/23/2021 1503   CREATININE 1.9 (H) 01/16/2017 0924      Component Value Date/Time   CALCIUM 10.1 12/23/2021 1503   CALCIUM 9.8 01/16/2017 0924   ALKPHOS 47 12/23/2021 1503   ALKPHOS 54 01/16/2017 0924   AST 11 (L) 12/23/2021 1503   AST 16 01/16/2017 0924   ALT 6 12/23/2021 1503   ALT 10 01/16/2017 0924   BILITOT 0.3 12/23/2021 1503   BILITOT 0.54  01/16/2017 0924      MPRESSION: No evidence of recurrent or metastatic carcinoma within the chest, abdomen, or pelvis.   Several subcapsular renal lesions have higher than fluid attenuation, with some showing mild increase in size since previous study. These may represent proteinaceous cysts although solid renal masses cannot be excluded. Abdomen MRI without and with contrast is recommended for further characterization.   Cholelithiasis. No radiographic evidence of cholecystitis.   Colonic diverticulosis, without radiographic evidence of diverticulitis.  Stable 3.1 cm distal abdominal aortic aneurysm. Recommend follow-up ultrasound every 3 years. This recommendation follows ACR consensus guidelines: White Paper of the ACR Incidental Findings Committee II on Vascular Findings. J Am Coll Radiol 2013; 10:789-794.   Aortic Atherosclerosis (ICD10-I70.0).  Impression and Plan:  76 year old man with:    1.  Gastric adenocarcinoma diagnosed in 2017.  He was found to have T3N0 disease and that currently in remission.  CT scan obtained on December 23, 2021 was personally reviewed and showed no evidence of relapsed disease.  At this time he is over 6 years out from his cancer treatment and no further imaging studies are required.  Repeat evaluation will be needed if he develops any symptoms.   2.  Anemia: Related to chronic renal failure and will be managed by nephrology moving forward.  Likely will receive growth factor support with dialysis.   3.  IgG MGUS: His M spike remains very low without any evidence to suggest endorgan damage.  His renal failure is unrelated and bone marrow biopsy showed borderline increase in his plasma cells in 2017.  Based on these findings have recommended annual monitoring of protein studies.  His current protein studies do not show evidence of evolving symptomatic multiple myeloma.   4. Renal insufficiency: Unrelated to plasma cell disorder and  continues to follow with nephrology.   5. Follow-up: Will return in 12 months for repeat follow-up and updating his protein studies.  30  minutes were spent on this visit.  The time was dedicated to updating disease status, treatment choices and outlining future plan of care discussion.   Zola Button, MD 12/13/20239:52 AM

## 2021-12-30 LAB — MULTIPLE MYELOMA PANEL, SERUM
Albumin SerPl Elph-Mcnc: 3.4 g/dL (ref 2.9–4.4)
Albumin/Glob SerPl: 1.2 (ref 0.7–1.7)
Alpha 1: 0.3 g/dL (ref 0.0–0.4)
Alpha2 Glob SerPl Elph-Mcnc: 0.8 g/dL (ref 0.4–1.0)
B-Globulin SerPl Elph-Mcnc: 0.9 g/dL (ref 0.7–1.3)
Gamma Glob SerPl Elph-Mcnc: 1.1 g/dL (ref 0.4–1.8)
Globulin, Total: 3 g/dL (ref 2.2–3.9)
IgA: 306 mg/dL (ref 61–437)
IgG (Immunoglobin G), Serum: 1014 mg/dL (ref 603–1613)
IgM (Immunoglobulin M), Srm: 39 mg/dL (ref 15–143)
M Protein SerPl Elph-Mcnc: 0.1 g/dL — ABNORMAL HIGH
Total Protein ELP: 6.4 g/dL (ref 6.0–8.5)

## 2022-01-01 ENCOUNTER — Other Ambulatory Visit (HOSPITAL_COMMUNITY): Payer: Self-pay | Admitting: *Deleted

## 2022-01-02 ENCOUNTER — Ambulatory Visit (HOSPITAL_COMMUNITY)
Admission: RE | Admit: 2022-01-02 | Discharge: 2022-01-02 | Disposition: A | Discharge status: Home / Self Care | Payer: Medicare Other | Source: Ambulatory Visit | Attending: Nephrology | Admitting: Nephrology

## 2022-01-02 VITALS — BP 121/86 | HR 80 | Temp 97.2°F | Resp 18

## 2022-01-02 DIAGNOSIS — N185 Chronic kidney disease, stage 5: Secondary | ICD-10-CM | POA: Diagnosis present

## 2022-01-02 DIAGNOSIS — D631 Anemia in chronic kidney disease: Secondary | ICD-10-CM | POA: Insufficient documentation

## 2022-01-02 LAB — POCT HEMOGLOBIN-HEMACUE: Hemoglobin: 9 g/dL — ABNORMAL LOW (ref 13.0–17.0)

## 2022-01-02 MED ORDER — EPOETIN ALFA-EPBX 10000 UNIT/ML IJ SOLN
20000.0000 [IU] | INTRAMUSCULAR | Status: DC
  Administered 2022-01-02: 20000 [IU] via SUBCUTANEOUS

## 2022-01-02 MED ORDER — EPOETIN ALFA-EPBX 10000 UNIT/ML IJ SOLN
INTRAMUSCULAR | Status: AC
  Filled 2022-01-02: qty 2

## 2022-01-16 ENCOUNTER — Encounter (HOSPITAL_COMMUNITY): Payer: Medicare Other

## 2022-01-30 ENCOUNTER — Ambulatory Visit (HOSPITAL_COMMUNITY)
Admission: RE | Admit: 2022-01-30 | Discharge: 2022-01-30 | Disposition: A | Discharge status: Home / Self Care | Payer: Medicare Other | Source: Ambulatory Visit | Attending: Nephrology | Admitting: Nephrology

## 2022-01-30 VITALS — BP 112/77 | HR 70 | Temp 97.9°F | Resp 18

## 2022-01-30 DIAGNOSIS — N185 Chronic kidney disease, stage 5: Secondary | ICD-10-CM | POA: Diagnosis not present

## 2022-01-30 DIAGNOSIS — D631 Anemia in chronic kidney disease: Secondary | ICD-10-CM

## 2022-01-30 LAB — RENAL FUNCTION PANEL
Albumin: 3.2 g/dL — ABNORMAL LOW (ref 3.5–5.0)
Anion gap: 9 (ref 5–15)
BUN: 64 mg/dL — ABNORMAL HIGH (ref 8–23)
CO2: 26 mmol/L (ref 22–32)
Calcium: 9.7 mg/dL (ref 8.9–10.3)
Chloride: 101 mmol/L (ref 98–111)
Creatinine, Ser: 6.06 mg/dL — ABNORMAL HIGH (ref 0.61–1.24)
GFR, Estimated: 9 mL/min — ABNORMAL LOW (ref >60)
Glucose, Bld: 86 mg/dL (ref 70–99)
Phosphorus: 2.9 mg/dL (ref 2.5–4.6)
Potassium: 4.2 mmol/L (ref 3.5–5.1)
Sodium: 136 mmol/L (ref 135–145)

## 2022-01-30 LAB — IRON AND TIBC
Iron: 69 ug/dL (ref 45–182)
Saturation Ratios: 25 % (ref 17.9–39.5)
TIBC: 279 ug/dL (ref 250–450)
UIBC: 210 ug/dL

## 2022-01-30 LAB — POCT HEMOGLOBIN-HEMACUE: Hemoglobin: 9.4 g/dL — ABNORMAL LOW (ref 13.0–17.0)

## 2022-01-30 MED ORDER — EPOETIN ALFA-EPBX 10000 UNIT/ML IJ SOLN
10000.0000 [IU] | INTRAMUSCULAR | Status: DC
  Administered 2022-01-30: 10000 [IU] via SUBCUTANEOUS

## 2022-01-30 MED ORDER — EPOETIN ALFA-EPBX 10000 UNIT/ML IJ SOLN: 20000.0000 [IU] | INTRAMUSCULAR | Status: DC

## 2022-01-30 MED ORDER — EPOETIN ALFA-EPBX 10000 UNIT/ML IJ SOLN
INTRAMUSCULAR | Status: AC
  Filled 2022-01-30: qty 1

## 2022-02-03 ENCOUNTER — Other Ambulatory Visit: Payer: Self-pay | Admitting: *Deleted

## 2022-02-03 DIAGNOSIS — N185 Chronic kidney disease, stage 5: Secondary | ICD-10-CM

## 2022-02-11 NOTE — Progress Notes (Deleted)
    Postoperative Access Visit   History of Present Illness   Adonijah Cerney is a 77 y.o. year old male who presents for postoperative follow-up for: Right upper extremity second stage brachiobasilic transposition 1/49/70 by Dr. Virl Cagey.  The patient's wounds are slowly healing. He has not noticed any drainage or bleeding from the incision. The patient notes no steal symptoms.  He does report some numbness along incision line. He is not currently on HD  Physical Examination   There were no vitals filed for this visit.  There is no height or weight on file to calculate BMI.  right arm Incisions are healing well. The proximal aspect of the more proximal incision in the right axilla has some separation and fibrinous exudate. I removed some of the staples in the more proximal incision. Applied dry gauze. The distal incision is healing well. Staples all removed. 1+ radial pulse, hand grip is 5/5, sensation in digits is intact, palpable thrill, bruit can be auscultated     Medical Decision Making   Sahan Forrester is a 77 y.o. year old male who presents s/p Right upper extremity second stage brachiobasilic transposition 2/63/78 by Dr. Virl Cagey. The proximal aspect of the more proximal incision in the right axilla has some separation and fibrinous exudate. Does not appear infected at this time. I removed some of the staples in the more proximal incision. Applied dry gauze. The distal incision is healing well. Staples all removed. He is not currently on Hemodialysis.  Patent is without signs or symptoms of steal syndrome The patient's access will be ready for use after 08/23/21 I will have him return in 1 week for close follow up of the incision and removal of remaining staples. There are no APP's in the office so I will arrange for him to return to see Dr. Laurel Dimmer, PA-C [facility] Office: [phone]  Clinic MD: Virl Cagey

## 2022-02-14 ENCOUNTER — Ambulatory Visit: Payer: Medicare Other | Admitting: Vascular Surgery

## 2022-02-14 ENCOUNTER — Ambulatory Visit (HOSPITAL_COMMUNITY): Payer: Medicare Other

## 2022-02-14 ENCOUNTER — Ambulatory Visit (HOSPITAL_COMMUNITY): Payer: Medicare Other | Attending: Vascular Surgery

## 2022-02-17 ENCOUNTER — Other Ambulatory Visit: Payer: Self-pay | Admitting: Oncology

## 2022-02-21 ENCOUNTER — Emergency Department (HOSPITAL_COMMUNITY)
Admission: EM | Admit: 2022-02-21 | Discharge: 2022-02-22 | Disposition: A | Discharge status: Home / Self Care | Payer: Medicare Other | Attending: Emergency Medicine | Admitting: Emergency Medicine

## 2022-02-21 ENCOUNTER — Other Ambulatory Visit: Payer: Self-pay

## 2022-02-21 ENCOUNTER — Encounter (HOSPITAL_COMMUNITY): Payer: Self-pay

## 2022-02-21 DIAGNOSIS — R918 Other nonspecific abnormal finding of lung field: Secondary | ICD-10-CM | POA: Insufficient documentation

## 2022-02-21 DIAGNOSIS — K5730 Diverticulosis of large intestine without perforation or abscess without bleeding: Secondary | ICD-10-CM | POA: Diagnosis not present

## 2022-02-21 DIAGNOSIS — K5900 Constipation, unspecified: Secondary | ICD-10-CM | POA: Insufficient documentation

## 2022-02-21 DIAGNOSIS — I3481 Nonrheumatic mitral (valve) annulus calcification: Secondary | ICD-10-CM | POA: Insufficient documentation

## 2022-02-21 DIAGNOSIS — I2510 Atherosclerotic heart disease of native coronary artery without angina pectoris: Secondary | ICD-10-CM | POA: Diagnosis not present

## 2022-02-21 DIAGNOSIS — I700 Atherosclerosis of aorta: Secondary | ICD-10-CM | POA: Insufficient documentation

## 2022-02-21 DIAGNOSIS — R059 Cough, unspecified: Secondary | ICD-10-CM | POA: Diagnosis not present

## 2022-02-21 DIAGNOSIS — K8020 Calculus of gallbladder without cholecystitis without obstruction: Secondary | ICD-10-CM | POA: Insufficient documentation

## 2022-02-21 DIAGNOSIS — I7143 Infrarenal abdominal aortic aneurysm, without rupture: Secondary | ICD-10-CM | POA: Diagnosis not present

## 2022-02-21 LAB — CBC WITH DIFFERENTIAL/PLATELET
Abs Immature Granulocytes: 0.28 10*3/uL — ABNORMAL HIGH (ref 0.00–0.07)
Basophils Absolute: 0 10*3/uL (ref 0.0–0.1)
Basophils Relative: 1 %
Eosinophils Absolute: 0.1 10*3/uL (ref 0.0–0.5)
Eosinophils Relative: 1 %
HCT: 35.6 % — ABNORMAL LOW (ref 39.0–52.0)
Hemoglobin: 11.6 g/dL — ABNORMAL LOW (ref 13.0–17.0)
Immature Granulocytes: 3 %
Lymphocytes Relative: 9 %
Lymphs Abs: 0.7 10*3/uL (ref 0.7–4.0)
MCH: 31.3 pg (ref 26.0–34.0)
MCHC: 32.6 g/dL (ref 30.0–36.0)
MCV: 96 fL (ref 80.0–100.0)
Monocytes Absolute: 0.4 10*3/uL (ref 0.1–1.0)
Monocytes Relative: 5 %
Neutro Abs: 6.7 10*3/uL (ref 1.7–7.7)
Neutrophils Relative %: 81 %
Platelets: 278 10*3/uL (ref 150–400)
RBC: 3.71 MIL/uL — ABNORMAL LOW (ref 4.22–5.81)
RDW: 14.3 % (ref 11.5–15.5)
WBC: 8.3 10*3/uL (ref 4.0–10.5)
nRBC: 0 % (ref 0.0–0.2)

## 2022-02-21 LAB — COMPREHENSIVE METABOLIC PANEL
ALT: 8 U/L (ref 0–44)
AST: 13 U/L — ABNORMAL LOW (ref 15–41)
Albumin: 3.1 g/dL — ABNORMAL LOW (ref 3.5–5.0)
Alkaline Phosphatase: 58 U/L (ref 38–126)
Anion gap: 14 (ref 5–15)
BUN: 96 mg/dL — ABNORMAL HIGH (ref 8–23)
CO2: 22 mmol/L (ref 22–32)
Calcium: 9.8 mg/dL (ref 8.9–10.3)
Chloride: 98 mmol/L (ref 98–111)
Creatinine, Ser: 7.38 mg/dL — ABNORMAL HIGH (ref 0.61–1.24)
GFR, Estimated: 7 mL/min — ABNORMAL LOW (ref >60)
Glucose, Bld: 93 mg/dL (ref 70–99)
Potassium: 4.1 mmol/L (ref 3.5–5.1)
Sodium: 134 mmol/L — ABNORMAL LOW (ref 135–145)
Total Bilirubin: 0.6 mg/dL (ref 0.3–1.2)
Total Protein: 7.7 g/dL (ref 6.5–8.1)

## 2022-02-21 LAB — PROTIME-INR
INR: 1.1 (ref 0.8–1.2)
Prothrombin Time: 14.2 seconds (ref 11.4–15.2)

## 2022-02-21 LAB — POC OCCULT BLOOD, ED: Fecal Occult Bld: NEGATIVE

## 2022-02-21 NOTE — ED Triage Notes (Signed)
Patient BIB EMS. Patient was constipated x4 days and family gave stool softener today. Since has had black tarry diarrhea. Patient denies taking blood thinners. Patient reports nausea and vomiting. Denies abdominal pain.

## 2022-02-21 NOTE — ED Provider Notes (Signed)
[facility] Emergency Department Provider Note MRN:  [id number]  Arrival date & time: 02/22/22     Chief Complaint   Diarrhea   History of Present Illness   Jacob [name] is a 77 y.o. year-old male presents to the ED with chief complaint of constipation for 1 week.  States that he took some medicine today and has had some diarrhea. Denies fevers, chills.  Has had some cough.  States that his stool has been dark.  Denies any abdominal pain.  History provided by patient.   Review of Systems  Pertinent positive and negative review of systems noted in HPI.    Physical Exam   Vitals:   02/22/22 0100 02/22/22 0203  BP: 122/88   Pulse: 78   Resp: 16   Temp:  98.5 F (36.9 C)  SpO2: 100%     CONSTITUTIONAL:  non toxic-appearing, NAD NEURO:  Alert and oriented x 3, CN 3-12 grossly intact EYES:  eyes equal and reactive ENT/NECK:  Supple, no stridor  CARDIO:  normal rate, regular rhythm, appears well-perfused  PULM:  No respiratory distress, CTAB GI/GU:  non-distended, no focal abdominal tenderness, dark loose stool in diaper MSK/SPINE:  No gross deformities, no edema, moves all extremities  SKIN:  no rash, atraumatic   *Additional and/or pertinent findings included in MDM below  Diagnostic and Interventional Summary    EKG Interpretation  Date/Time:    Ventricular Rate:    PR Interval:    QRS Duration:   QT Interval:    QTC Calculation:   R Axis:     Text Interpretation:         Labs Reviewed  COMPREHENSIVE METABOLIC PANEL - Abnormal; Notable for the following components:      Result Value   Sodium 134 (*)    BUN 96 (*)    Creatinine, Ser 7.38 (*)    Albumin 3.1 (*)    AST 13 (*)    GFR, Estimated 7 (*)    All other components within normal limits  CBC WITH DIFFERENTIAL/PLATELET - Abnormal; Notable for the following components:   RBC 3.71 (*)    Hemoglobin 11.6 (*)    HCT 35.6 (*)    Abs Immature Granulocytes 0.28 (*)     All other components within normal limits  PROTIME-INR  POC OCCULT BLOOD, ED  TYPE AND SCREEN    CT ABDOMEN PELVIS WO CONTRAST  Final Result      Medications  lactated ringers bolus 500 mL (0 mLs Intravenous Stopped 02/22/22 0223)     Procedures  /  Critical Care Procedures  ED Course and Medical Decision Making  I have reviewed the triage vital signs, the nursing notes, and pertinent available records from the EMR.  Social Determinants Affecting Complexity of Care: Patient has no clinically significant social determinants affecting this chief complaint..   ED Course:    Medical Decision Making Patient here with 1 week of constipation.  Now having some diarrhea.  He has been taking stool softeners.  He denies any abdominal pain.  Denies fevers or chills.    He had a large BM in his diaper.  He states that he is feeling much better.  CT doesn't show any obvious obstructive process.  Questionable infection in lung bases, which with patient's reported cough, will give azithromax.  Will give miralax and recommend hydration to help pass stools.  Hemoccult negative.  VSS.    He says that he feels better and is comfortable  with going home.  Amount and/or Complexity of Data Reviewed Labs: ordered.    Details: POC occult blood negative Radiology: ordered.  Risk Prescription drug management.     Consultants: No consultations were needed in caring for this patient.   Treatment and Plan: Emergency department workup does not suggest an emergent condition requiring admission or immediate intervention beyond  what has been performed at this time. The patient is safe for discharge and has  been instructed to return immediately for worsening symptoms, change in  symptoms or any other concerns    Final Clinical Impressions(s) / ED Diagnoses     ICD-10-CM   1. Constipation, unspecified constipation type  K59.00     2. Cough, unspecified type  R05.9       ED Discharge Orders           Ordered    azithromycin (ZITHROMAX) 250 MG tablet  Daily        02/22/22 0228    polyethylene glycol powder (GLYCOLAX/MIRALAX) 17 GM/SCOOP powder  2 times daily        02/22/22 0228              Discharge Instructions Discussed with and Provided to Patient:   Discharge Instructions   None      Montine Circle, PA-C 02/22/22 0236    Horton, Alvin Critchley, DO 02/22/22 1706

## 2022-02-22 ENCOUNTER — Encounter: Payer: Self-pay | Admitting: Vascular Surgery

## 2022-02-22 ENCOUNTER — Emergency Department (HOSPITAL_COMMUNITY): Payer: Medicare Other

## 2022-02-22 LAB — TYPE AND SCREEN
ABO/RH(D): O POS
Antibody Screen: NEGATIVE

## 2022-02-22 MED ORDER — LACTATED RINGERS IV BOLUS
500.0000 mL | Freq: Once | INTRAVENOUS | Status: AC
  Administered 2022-02-22: 500 mL via INTRAVENOUS

## 2022-02-22 MED ORDER — AZITHROMYCIN 250 MG PO TABS: 250.0000 mg | ORAL_TABLET | Freq: Every day | ORAL | 0 refills | Status: DC

## 2022-02-22 MED ORDER — POLYETHYLENE GLYCOL 3350 17 GM/SCOOP PO POWD: 17.0000 g | Freq: Two times a day (BID) | ORAL | 0 refills | Status: DC

## 2022-02-22 MED ORDER — HEPARIN SOD (PORK) LOCK FLUSH 100 UNIT/ML IV SOLN
500.0000 [IU] | Freq: Once | INTRAVENOUS | Status: AC
  Administered 2022-02-22: 500 [IU]
  Filled 2022-02-22: qty 5

## 2022-02-25 ENCOUNTER — Encounter (HOSPITAL_COMMUNITY): Payer: Medicare Other

## 2022-02-27 ENCOUNTER — Encounter (HOSPITAL_COMMUNITY): Payer: Medicare Other

## 2022-03-10 ENCOUNTER — Encounter (HOSPITAL_COMMUNITY)
Admission: RE | Admit: 2022-03-10 | Discharge: 2022-03-10 | Disposition: A | Discharge status: Home / Self Care | Payer: Medicare Other | Source: Ambulatory Visit | Attending: Nephrology | Admitting: Nephrology

## 2022-03-10 VITALS — BP 97/71 | HR 82 | Temp 97.3°F | Resp 17

## 2022-03-10 DIAGNOSIS — D631 Anemia in chronic kidney disease: Secondary | ICD-10-CM | POA: Insufficient documentation

## 2022-03-10 DIAGNOSIS — N185 Chronic kidney disease, stage 5: Secondary | ICD-10-CM | POA: Insufficient documentation

## 2022-03-10 LAB — RENAL FUNCTION PANEL
Albumin: 3.2 g/dL — ABNORMAL LOW (ref 3.5–5.0)
Anion gap: 13 (ref 5–15)
BUN: 59 mg/dL — ABNORMAL HIGH (ref 8–23)
CO2: 20 mmol/L — ABNORMAL LOW (ref 22–32)
Calcium: 9.9 mg/dL (ref 8.9–10.3)
Chloride: 97 mmol/L — ABNORMAL LOW (ref 98–111)
Creatinine, Ser: 6.24 mg/dL — ABNORMAL HIGH (ref 0.61–1.24)
GFR, Estimated: 9 mL/min — ABNORMAL LOW (ref >60)
Glucose, Bld: 171 mg/dL — ABNORMAL HIGH (ref 70–99)
Phosphorus: 3.7 mg/dL (ref 2.5–4.6)
Potassium: 4.2 mmol/L (ref 3.5–5.1)
Sodium: 130 mmol/L — ABNORMAL LOW (ref 135–145)

## 2022-03-10 LAB — IRON AND TIBC
Iron: 66 ug/dL (ref 45–182)
Saturation Ratios: 23 % (ref 17.9–39.5)
TIBC: 283 ug/dL (ref 250–450)
UIBC: 217 ug/dL

## 2022-03-10 LAB — POCT HEMOGLOBIN-HEMACUE: Hemoglobin: 10 g/dL — ABNORMAL LOW (ref 13.0–17.0)

## 2022-03-10 MED ORDER — EPOETIN ALFA-EPBX 10000 UNIT/ML IJ SOLN
INTRAMUSCULAR | Status: AC
  Filled 2022-03-10: qty 1

## 2022-03-10 MED ORDER — EPOETIN ALFA-EPBX 10000 UNIT/ML IJ SOLN
10000.0000 [IU] | INTRAMUSCULAR | Status: DC
  Administered 2022-03-10: 10000 [IU] via SUBCUTANEOUS

## 2022-03-25 ENCOUNTER — Encounter (HOSPITAL_COMMUNITY): Payer: Medicare Other

## 2022-03-27 NOTE — Progress Notes (Signed)
Office Note     CC:  Need for new HD access.  Requesting Provider:  Rexene Agent, MD  HPI: Jacob [name] is a Left handed 77 y.o. (Jul 28, 1945) male with kidney disease who presents at the request of Rexene Agent, MD for permanent HD access. The patient has had one prior access procedure - Left brachiobasilic fistula creation with subsequent transposition.  Unfortunate, his last nephrology appointment, the fistula was found to be thrombosed.  Jacob [name] was unsure when the fistula thrombosed.    On exam, Jacob [name] was doing well, accompanied by his son.  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 Since last seen roughly a year ago, he has declined significantly, with significant muscle loss in the lower extremities, with recent history of falls due to dizziness.     Past Medical History:  Diagnosis Date   Anemia    hx iron deficiency   Anxiety    Arthritis    gout   Back pain    BPH with obstruction/lower urinary tract symptoms    Chronic kidney disease    "they said I do"   Constipation    Depression    ED (erectile dysfunction)    Epigastric pain    GERD (gastroesophageal reflux disease)    Heartburn    History of blood transfusion    History of radiation therapy 11/03/11-12/29/11   prostate   Hypercholesterolemia    Hypertension    Night sweats    Oxygen deficiency    2x per week at night for sleep   Post-operative nausea and vomiting 04/09/2016   Prostate cancer ([place]) 08/14/2011   Adenocarcinoma,gleason:3+3=6,& 3+4=7,PSA=5.66   PUD (peptic ulcer disease)    Sleep apnea    does not use Cpap but occasionally uses O2 @ 2L qhs   Stomach cancer ([place])    Ulcer    peptic ulcer hx    Past Surgical History:  Procedure Laterality Date   AV FISTULA PLACEMENT Right 02/19/2021   Procedure: ARTERIOVENOUS (AV) FISTULA CREATION;  Surgeon: [name], MD;  Location: [place];  Service: Vascular;  Laterality: Right;   [place] Right 07/23/2021   Procedure: RIGHT ARM BASILIC VEIN TRANSPOSITION;  Surgeon: [name], MD;  Location: [place];  Service: Vascular;  Laterality: Right;  PERIPHERAL NERVE BLOCK   colon polyps bx  11/24/06   colon,transverse and rectosigmoid polyps:tubular adenomas and hyperplastic polyps,no high grade dysplasia or malignancy    COLONOSCOPY WITH PROPOFOL N/A 08/24/2019   Procedure: COLONOSCOPY WITH PROPOFOL;  Surgeon: [name], DO;  Location: [facility];  Service: Gastroenterology;  Laterality: N/A;   duodenal bx  11/24/06   benign   ESOPHAGOGASTRODUODENOSCOPY N/A 10/27/2015   Procedure: ESOPHAGOGASTRODUODENOSCOPY (EGD);  Surgeon: [name], MD;  Location: [facility];  Service: Endoscopy;  Laterality: N/A;   EUS N/A 11/08/2015   Procedure: UPPER ENDOSCOPIC ULTRASOUND (EUS) RADIAL;  Surgeon: [name], MD;  Location: [facility];  Service: Endoscopy;  Laterality: N/A;   GASTRECTOMY N/A 03/25/2016   Procedure: DISTAL GASTRECTOMY;  Surgeon: [name], MD;  Location: [place];  Service: General;  Laterality: N/A;   gastric bx  11/24/06   chronic active gastritis,with metaplasia and focal changaes of xanthelasma   GASTROSTOMY N/A 03/25/2016   Procedure: INSERTION OF FEEDING TUBE;  Surgeon: [name], MD;  Location: [place];  Service: General;  Laterality: N/A;   INSERTION PROSTATE RADIATION SEED  12-29-11   IR GENERIC HISTORICAL  04/01/2016   IR [facility] TUBE CHANGE 04/01/2016 Markus Daft, MD [facility]   IR GENERIC HISTORICAL  04/05/2016   IR [place] TUBE CHANGE 04/05/2016 Markus Daft, MD [facility]   LAPAROSCOPIC GASTRECTOMY  03/25/2016   Diagnostic laparoscopy, distal gastrectomy with Billroth 2 reconstruction and gastrojejunostomy tube   LAPAROSCOPY N/A 03/25/2016   Procedure: DIAGNOSTIC LAPAROSCOPY;  Surgeon: [name], MD;  Location: [place] [facility];  Service: General;  Laterality: N/A;   POLYPECTOMY  08/24/2019   Procedure: POLYPECTOMY;  Surgeon: [name],  DO;  Location: [facility];  Service: Gastroenterology;;   PORTACATH PLACEMENT Left 12/04/2015   Procedure: INSERTION PORT-A-CATH;  Surgeon: [name], MD;  Location: [place];  Service: General;  Laterality: Left;   PROSTATE BIOPSY  08/14/11   Adenocarcinoma/volume=58.51cc,gleason=3+3=6 & 3+4=7   TONSILLECTOMY     77 years old    Social History   Socioeconomic History   Marital status: Married    Spouse name: Jacob [name]   Number of children: 3   Years of education: 12   Highest education level: Not on file  Occupational History   Occupation: retired    Fish farm manager: [employer]  Tobacco Use   Smoking status: Former    Packs/day: 1.50    Years: 30.00    Additional pack years: 0.00    Total pack years: 45.00    Types: Cigarettes    Quit date: 04/13/2000    Years since quitting: 21.9   Smokeless tobacco: Never  Vaping Use   Vaping Use: Never used  Substance and Sexual Activity   Alcohol use: Not Currently    Alcohol/week: 1.0 standard drink of alcohol    Types: 1 Cans of beer per week    Comment: a beer about once a week, less than in his youth   Drug use: No   Sexual activity: Not Currently  Other Topics Concern   Not on file  Social History Narrative   Patient is married Jacob [name]) and lives at home with his wife and one child.   Patient has three children.   Patient is retired.   Patient has a high school education.   Patient is ambi-dextrous.   Patient drinks two cups of coffee about three times a week.    Social Determinants of Health   Financial Resource Strain: Not on file  Food Insecurity: Not on file  Transportation Needs: Not on file  Physical Activity: Not on file  Stress: Not on file  Social Connections: Not on file  Intimate Partner Violence: Not on file   Family History  Problem Relation Age of Onset   Cancer Mother        NOS   Alcohol abuse Father    Alcohol abuse Brother 67   Cancer Paternal Aunt        NOS   Colon cancer Neg Hx     Current  Outpatient Medications  Medication Sig Dispense Refill   acetaminophen (TYLENOL) 500 MG tablet Take 1,000 mg by mouth daily as needed for moderate pain or headache.     allopurinol (ZYLOPRIM) 300 MG tablet Take 150 mg by mouth daily.     aspirin EC 81 MG tablet Take 1 tablet (81 mg total) by mouth daily. Swallow whole. 90 tablet 3   azithromycin (ZITHROMAX) 250 MG tablet Take 1 tablet (250 mg total) by mouth daily. Take first 2 tablets together, then 1 every day until finished. 6 tablet 0   buPROPion (WELLBUTRIN XL) 300 MG 24 hr tablet  Take 300 mg by mouth daily.      calcium carbonate (TUMS - DOSED IN MG ELEMENTAL CALCIUM) 500 MG chewable tablet Chew 3 tablets by mouth 2 (two) times daily as needed for indigestion or heartburn.     cetirizine (ZYRTEC) 10 MG tablet Take 10 mg by mouth daily.     Cholecalciferol (VITAMIN D3) 10 MCG (400 UNIT) CAPS Take 400 mg by mouth daily.     diclofenac Sodium (VOLTAREN) 1 % GEL Apply 2 g topically 4 (four) times daily. 100 g 1   donepezil (ARICEPT) 5 MG tablet Take 1 tablet (5 mg total) by mouth at bedtime. 30 tablet 6   ferrous sulfate 325 (65 FE) MG tablet Take 325 mg by mouth daily with breakfast.     furosemide (LASIX) 40 MG tablet Take 40 mg by mouth daily.     hydrALAZINE (APRESOLINE) 50 MG tablet Take 50 mg by mouth 3 (three) times daily.     lidocaine (XYLOCAINE) 5 % ointment Apply 1 application topically as needed. 35.44 g 0   Melatonin 10 MG TABS Take 10 mg by mouth at bedtime.     Nebivolol HCl 20 MG TABS Take 20 mg by mouth at bedtime.     oxyCODONE-acetaminophen (PERCOCET) 5-325 MG tablet Take 1 tablet by mouth every 4 (four) hours as needed for severe pain. 24 tablet 0   pantoprazole (PROTONIX) 40 MG tablet Take 1 tablet (40 mg total) by mouth daily before supper. 30 tablet 0   polyethylene glycol powder (GLYCOLAX/MIRALAX) 17 GM/SCOOP powder Take 17 g by mouth 2 (two) times daily. Until daily soft stools OTC 225 g 0   rosuvastatin (CRESTOR) 5 MG  tablet Take 5 mg by mouth daily.     sodium bicarbonate 650 MG tablet Take 650 mg by mouth 2 (two) times daily.     sodium zirconium cyclosilicate (LOKELMA) 10 g PACK packet Take 10 g by mouth every other day.     tamsulosin (FLOMAX) 0.4 MG CAPS capsule Take 1 capsule by mouth every day 30 capsule 11   venlafaxine XR (EFFEXOR-XR) 75 MG 24 hr capsule Take 75 mg by mouth daily.     verapamil (CALAN) 80 MG tablet Take 80 mg by mouth 3 (three) times daily.      No current facility-administered medications for this visit.    Allergies  Allergen Reactions   Lisinopril Cough     REVIEW OF SYSTEMS:  [X]  denotes positive finding, [ ]  denotes negative finding Cardiac  Comments:  Chest pain or chest pressure:    Shortness of breath upon exertion:    Short of breath when lying flat:    Irregular heart rhythm:        Vascular    Pain in calf, thigh, or hip brought on by ambulation:    Pain in feet at night that wakes you up from your sleep:     Blood clot in your veins:    Leg swelling:         Pulmonary    Oxygen at home:    Productive cough:     Wheezing:         Neurologic    Sudden weakness in arms or legs:     Sudden numbness in arms or legs:     Sudden onset of difficulty speaking or slurred speech:    Temporary loss of vision in one eye:     Problems with dizziness:  Gastrointestinal    Blood in stool:     Vomited blood:         Genitourinary    Burning when urinating:     Blood in urine:        Psychiatric    Major depression:         Hematologic    Bleeding problems:    Problems with blood clotting too easily:        Skin    Rashes or ulcers:        Constitutional    Fever or chills:      PHYSICAL EXAMINATION:  There were no vitals filed for this visit.  General:  WDWN in NAD; vital signs documented above Gait: Not observed HENT: WNL, normocephalic Pulmonary: normal non-labored breathing , without Rales, rhonchi,  wheezing Cardiac: regular  HR Abdomen: soft, NT, no masses Skin: without rashes Vascular Exam/Pulses:  Right Left  Radial 2+ (normal) 2+ (normal)  Ulnar 2+ (normal) 2+ (normal)                   Extremities: without ischemic changes, without Gangrene , without cellulitis; without open wounds;  Some bilateral lower extremity edema Musculoskeletal: no muscle wasting or atrophy  Neurologic: A&O X 3;  No focal weakness or paresthesias are detected Psychiatric:  The pt has Normal affect.   Non-Invasive Vascular Imaging:   Small cephalic veins bilaterally, right brachiobasilic fistula thrombosed, left basilic vein of adequate size, liver not visualized at the level of the shoulder.    ASSESSMENT/PLAN:  Zailyn Reppucci is a 77 y.o. male who presents with chronic kidney disease stage 5  Based on vein mapping and examination, Jacob [name] is a candidate for right-sided AV graft versus left-sided brachiobasilic fistula. Being that he is left-handed, and that the left basilic vein appears small in the axilla, I think he would be best suited with right arm AV graft. I had an extensive discussion with this patient in regards to the nature of access surgery, including risk, benefits, and alternatives.   The patient is aware that the risks of access surgery include but are not limited to: bleeding, infection, steal syndrome, nerve damage, ischemic monomelic neuropathy, failure of access to mature, complications related to venous hypertension, and possible need for additional access procedures in the future. Paperwork asked that in the setting of graft, we hold until the patient's kidney function worsens.  I am happy to set up this surgery once deemed appropriate by nephrology.  [name], MD [facility] [phone] Total time of patient care including pre-visit research, consultation, and documentation greater than 30 minutes

## 2022-03-28 ENCOUNTER — Ambulatory Visit: Payer: Medicare Other | Admitting: Vascular Surgery

## 2022-03-28 ENCOUNTER — Ambulatory Visit (INDEPENDENT_AMBULATORY_CARE_PROVIDER_SITE_OTHER)
Admission: RE | Admit: 2022-03-28 | Discharge: 2022-03-28 | Disposition: A | Discharge status: Home / Self Care | Payer: Medicare Other | Source: Ambulatory Visit | Attending: Vascular Surgery | Admitting: Vascular Surgery

## 2022-03-28 ENCOUNTER — Encounter: Payer: Self-pay | Admitting: Vascular Surgery

## 2022-03-28 ENCOUNTER — Ambulatory Visit (HOSPITAL_COMMUNITY)
Admission: RE | Admit: 2022-03-28 | Discharge: 2022-03-28 | Disposition: A | Discharge status: Home / Self Care | Payer: Medicare Other | Source: Ambulatory Visit | Attending: Vascular Surgery | Admitting: Vascular Surgery

## 2022-03-28 VITALS — BP 108/72 | HR 68 | Temp 98.2°F | Resp 20 | Ht 74.0 in | Wt 200.0 lb

## 2022-03-28 DIAGNOSIS — N185 Chronic kidney disease, stage 5: Secondary | ICD-10-CM

## 2022-04-07 ENCOUNTER — Encounter (HOSPITAL_COMMUNITY)
Admission: RE | Admit: 2022-04-07 | Discharge: 2022-04-07 | Disposition: A | Discharge status: Home / Self Care | Payer: Medicare Other | Source: Ambulatory Visit | Attending: Nephrology | Admitting: Nephrology

## 2022-04-07 VITALS — BP 104/80 | HR 79 | Temp 97.4°F | Resp 18

## 2022-04-07 DIAGNOSIS — D631 Anemia in chronic kidney disease: Secondary | ICD-10-CM | POA: Diagnosis present

## 2022-04-07 DIAGNOSIS — N185 Chronic kidney disease, stage 5: Secondary | ICD-10-CM | POA: Diagnosis not present

## 2022-04-07 LAB — RENAL FUNCTION PANEL
Albumin: 3.3 g/dL — ABNORMAL LOW (ref 3.5–5.0)
Anion gap: 11 (ref 5–15)
BUN: 45 mg/dL — ABNORMAL HIGH (ref 8–23)
CO2: 24 mmol/L (ref 22–32)
Calcium: 10 mg/dL (ref 8.9–10.3)
Chloride: 98 mmol/L (ref 98–111)
Creatinine, Ser: 5.03 mg/dL — ABNORMAL HIGH (ref 0.61–1.24)
GFR, Estimated: 11 mL/min — ABNORMAL LOW (ref >60)
Glucose, Bld: 83 mg/dL (ref 70–99)
Phosphorus: 3.2 mg/dL (ref 2.5–4.6)
Potassium: 4 mmol/L (ref 3.5–5.1)
Sodium: 133 mmol/L — ABNORMAL LOW (ref 135–145)

## 2022-04-07 LAB — FERRITIN: Ferritin: 158 ng/mL (ref 24–336)

## 2022-04-07 LAB — IRON AND TIBC
Iron: 76 ug/dL (ref 45–182)
Saturation Ratios: 28 % (ref 17.9–39.5)
TIBC: 276 ug/dL (ref 250–450)
UIBC: 200 ug/dL

## 2022-04-07 LAB — POCT HEMOGLOBIN-HEMACUE: Hemoglobin: 9.8 g/dL — ABNORMAL LOW (ref 13.0–17.0)

## 2022-04-07 MED ORDER — EPOETIN ALFA-EPBX 10000 UNIT/ML IJ SOLN
INTRAMUSCULAR | Status: AC
  Filled 2022-04-07: qty 1

## 2022-04-07 MED ORDER — EPOETIN ALFA-EPBX 10000 UNIT/ML IJ SOLN
10000.0000 [IU] | INTRAMUSCULAR | Status: DC
  Administered 2022-04-07: 10000 [IU] via SUBCUTANEOUS

## 2022-04-09 LAB — PTH, INTACT AND CALCIUM
Calcium, Total (PTH): 10.2 mg/dL (ref 8.6–10.2)
PTH: 220 pg/mL — ABNORMAL HIGH (ref 15–65)

## 2022-04-17 ENCOUNTER — Other Ambulatory Visit: Payer: Self-pay

## 2022-04-17 ENCOUNTER — Telehealth: Payer: Self-pay

## 2022-04-17 ENCOUNTER — Encounter (HOSPITAL_COMMUNITY): Payer: Self-pay | Admitting: Vascular Surgery

## 2022-04-17 DIAGNOSIS — N185 Chronic kidney disease, stage 5: Secondary | ICD-10-CM

## 2022-04-17 NOTE — Progress Notes (Signed)
Spoke with pt for pre-op call. Pt denies cardiac history or Diabetes. He is treated for HTN and CKD. He states his medications come in daily packets and he can't identify the pills to know which ones to take.   Shower instructions given to pt.

## 2022-04-17 NOTE — Telephone Encounter (Signed)
Attempted to reach patient to schedule surgery at both contact numbers listed. Left vm message for patient to return call.

## 2022-04-17 NOTE — Telephone Encounter (Signed)
Error. Duplicate note creation.

## 2022-04-17 NOTE — Telephone Encounter (Signed)
Spoke with patient and scheduled surgery for 4/8. Instructions provided and patient repeated back, verbalizing understanding.

## 2022-04-17 NOTE — Telephone Encounter (Signed)
-----   Message from [name], MD sent at 04/16/2022  9:25 PM EDT ----- Regarding: FW: Please schedule for Right arm AVG, with TDC this Monday with me   Thank you ----- Message ----- From: Rexene Agent, MD Sent: 04/15/2022   3:40 PM EDT To: [name], MD  Hi Eli Thanks for seeing him recently.  I just saw him in clinic. He's lost about 30lb and has no appetatie. I think its all uremia.  Nothing emergent, do you think you can get him in quickly for the AVG and place a TDC at the same time? We'll get him started on HD thereafter RS

## 2022-04-21 ENCOUNTER — Ambulatory Visit (HOSPITAL_BASED_OUTPATIENT_CLINIC_OR_DEPARTMENT_OTHER): Payer: Medicare Other | Admitting: Anesthesiology

## 2022-04-21 ENCOUNTER — Other Ambulatory Visit: Payer: Self-pay

## 2022-04-21 ENCOUNTER — Ambulatory Visit (HOSPITAL_COMMUNITY): Payer: Medicare Other

## 2022-04-21 ENCOUNTER — Ambulatory Visit (HOSPITAL_COMMUNITY)
Admission: RE | Admit: 2022-04-21 | Discharge: 2022-04-21 | Disposition: A | Discharge status: Home / Self Care | Payer: Medicare Other | Attending: Vascular Surgery | Admitting: Vascular Surgery

## 2022-04-21 ENCOUNTER — Encounter (HOSPITAL_COMMUNITY): Payer: Self-pay | Admitting: Vascular Surgery

## 2022-04-21 ENCOUNTER — Ambulatory Visit (HOSPITAL_COMMUNITY): Payer: Medicare Other | Admitting: Anesthesiology

## 2022-04-21 ENCOUNTER — Encounter (HOSPITAL_COMMUNITY)
Admission: RE | Disposition: A | Discharge status: Home / Self Care | Payer: Self-pay | Source: Home / Self Care | Attending: Vascular Surgery

## 2022-04-21 DIAGNOSIS — I120 Hypertensive chronic kidney disease with stage 5 chronic kidney disease or end stage renal disease: Secondary | ICD-10-CM

## 2022-04-21 DIAGNOSIS — N185 Chronic kidney disease, stage 5: Secondary | ICD-10-CM

## 2022-04-21 DIAGNOSIS — Z87891 Personal history of nicotine dependence: Secondary | ICD-10-CM | POA: Insufficient documentation

## 2022-04-21 DIAGNOSIS — Z992 Dependence on renal dialysis: Secondary | ICD-10-CM | POA: Insufficient documentation

## 2022-04-21 DIAGNOSIS — G4730 Sleep apnea, unspecified: Secondary | ICD-10-CM | POA: Diagnosis not present

## 2022-04-21 DIAGNOSIS — D631 Anemia in chronic kidney disease: Secondary | ICD-10-CM

## 2022-04-21 DIAGNOSIS — Z09 Encounter for follow-up examination after completed treatment for conditions other than malignant neoplasm: Secondary | ICD-10-CM | POA: Diagnosis not present

## 2022-04-21 DIAGNOSIS — N186 End stage renal disease: Secondary | ICD-10-CM

## 2022-04-21 HISTORY — DX: Pneumonia, unspecified organism: J18.9

## 2022-04-21 HISTORY — PX: AV FISTULA PLACEMENT: SHX1204

## 2022-04-21 HISTORY — PX: INSERTION OF DIALYSIS CATHETER: SHX1324

## 2022-04-21 HISTORY — DX: Dyspnea, unspecified: R06.00

## 2022-04-21 LAB — POCT I-STAT, CHEM 8
BUN: 42 mg/dL — ABNORMAL HIGH (ref 8–23)
Calcium, Ion: 1.32 mmol/L (ref 1.15–1.40)
Chloride: 104 mmol/L (ref 98–111)
Creatinine, Ser: 5.2 mg/dL — ABNORMAL HIGH (ref 0.61–1.24)
Glucose, Bld: 79 mg/dL (ref 70–99)
HCT: 32 % — ABNORMAL LOW (ref 39.0–52.0)
Hemoglobin: 10.9 g/dL — ABNORMAL LOW (ref 13.0–17.0)
Potassium: 4.3 mmol/L (ref 3.5–5.1)
Sodium: 141 mmol/L (ref 135–145)
TCO2: 27 mmol/L (ref 22–32)

## 2022-04-21 SURGERY — INSERTION OF ARTERIOVENOUS (AV) GORE-TEX GRAFT ARM
Anesthesia: General | Site: Chest | Laterality: Right

## 2022-04-21 MED ORDER — SODIUM CHLORIDE 0.9 % IV SOLN: INTRAVENOUS | Status: DC

## 2022-04-21 MED ORDER — LIDOCAINE 2% (20 MG/ML) 5 ML SYRINGE
INTRAMUSCULAR | Status: DC | PRN
  Administered 2022-04-21: 60 mg via INTRAVENOUS

## 2022-04-21 MED ORDER — HEPARIN 6000 UNIT IRRIGATION SOLUTION
Status: DC | PRN
  Administered 2022-04-21: 1

## 2022-04-21 MED ORDER — PROPOFOL 10 MG/ML IV BOLUS
INTRAVENOUS | Status: AC
  Filled 2022-04-21: qty 20

## 2022-04-21 MED ORDER — PHENYLEPHRINE 80 MCG/ML (10ML) SYRINGE FOR IV PUSH (FOR BLOOD PRESSURE SUPPORT)
PREFILLED_SYRINGE | INTRAVENOUS | Status: AC
  Filled 2022-04-21: qty 10

## 2022-04-21 MED ORDER — OXYCODONE HCL 5 MG/5ML PO SOLN: 5.0000 mg | Freq: Once | ORAL | Status: AC | PRN

## 2022-04-21 MED ORDER — ONDANSETRON HCL 4 MG/2ML IJ SOLN
INTRAMUSCULAR | Status: AC
  Filled 2022-04-21: qty 2

## 2022-04-21 MED ORDER — FENTANYL CITRATE (PF) 250 MCG/5ML IJ SOLN
INTRAMUSCULAR | Status: AC
  Filled 2022-04-21: qty 5

## 2022-04-21 MED ORDER — ORAL CARE MOUTH RINSE: 15.0000 mL | Freq: Once | OROMUCOSAL | Status: AC

## 2022-04-21 MED ORDER — HEPARIN SODIUM (PORCINE) 1000 UNIT/ML IJ SOLN
INTRAMUSCULAR | Status: DC | PRN
  Administered 2022-04-21: 3000 [IU] via INTRAVENOUS
  Administered 2022-04-21: 2000 [IU] via INTRAVENOUS

## 2022-04-21 MED ORDER — ONDANSETRON HCL 4 MG/2ML IJ SOLN
INTRAMUSCULAR | Status: DC | PRN
  Administered 2022-04-21: 4 mg via INTRAVENOUS

## 2022-04-21 MED ORDER — HEPARIN 6000 UNIT IRRIGATION SOLUTION
Status: AC
  Filled 2022-04-21: qty 500

## 2022-04-21 MED ORDER — HEPARIN SODIUM (PORCINE) 1000 UNIT/ML IJ SOLN
INTRAMUSCULAR | Status: AC
  Filled 2022-04-21: qty 10

## 2022-04-21 MED ORDER — OXYCODONE HCL 5 MG PO TABS
ORAL_TABLET | ORAL | Status: AC
  Administered 2022-04-21: 5 mg via ORAL
  Filled 2022-04-21: qty 1

## 2022-04-21 MED ORDER — DEXAMETHASONE SODIUM PHOSPHATE 10 MG/ML IJ SOLN
INTRAMUSCULAR | Status: AC
  Filled 2022-04-21: qty 1

## 2022-04-21 MED ORDER — AMISULPRIDE (ANTIEMETIC) 5 MG/2ML IV SOLN: 10.0000 mg | Freq: Once | INTRAVENOUS | Status: DC | PRN

## 2022-04-21 MED ORDER — FENTANYL CITRATE (PF) 250 MCG/5ML IJ SOLN
INTRAMUSCULAR | Status: DC | PRN
  Administered 2022-04-21 (×2): 50 ug via INTRAVENOUS

## 2022-04-21 MED ORDER — HEMOSTATIC AGENTS (NO CHARGE) OPTIME
TOPICAL | Status: DC | PRN
  Administered 2022-04-21: 2 via TOPICAL
  Administered 2022-04-21: 1 via TOPICAL

## 2022-04-21 MED ORDER — PROPOFOL 10 MG/ML IV BOLUS
INTRAVENOUS | Status: DC | PRN
  Administered 2022-04-21: 120 mg via INTRAVENOUS
  Administered 2022-04-21: 80 mg via INTRAVENOUS

## 2022-04-21 MED ORDER — EPHEDRINE SULFATE-NACL 50-0.9 MG/10ML-% IV SOSY
PREFILLED_SYRINGE | INTRAVENOUS | Status: DC | PRN
  Administered 2022-04-21: 5 mg via INTRAVENOUS
  Administered 2022-04-21: 10 mg via INTRAVENOUS

## 2022-04-21 MED ORDER — PROTAMINE SULFATE 10 MG/ML IV SOLN
INTRAVENOUS | Status: DC | PRN
  Administered 2022-04-21: 30 mg via INTRAVENOUS

## 2022-04-21 MED ORDER — PHENYLEPHRINE 80 MCG/ML (10ML) SYRINGE FOR IV PUSH (FOR BLOOD PRESSURE SUPPORT)
PREFILLED_SYRINGE | INTRAVENOUS | Status: DC | PRN
  Administered 2022-04-21: 160 ug via INTRAVENOUS

## 2022-04-21 MED ORDER — EPHEDRINE 5 MG/ML INJ
INTRAVENOUS | Status: AC
  Filled 2022-04-21: qty 5

## 2022-04-21 MED ORDER — 0.9 % SODIUM CHLORIDE (POUR BTL) OPTIME
TOPICAL | Status: DC | PRN
  Administered 2022-04-21: 1000 mL

## 2022-04-21 MED ORDER — CHLORHEXIDINE GLUCONATE 0.12 % MT SOLN
15.0000 mL | Freq: Once | OROMUCOSAL | Status: AC
  Administered 2022-04-21: 15 mL via OROMUCOSAL
  Filled 2022-04-21: qty 15

## 2022-04-21 MED ORDER — HEPARIN SODIUM (PORCINE) 1000 UNIT/ML IJ SOLN
INTRAMUSCULAR | Status: DC | PRN
  Administered 2022-04-21: 4200 [IU]

## 2022-04-21 MED ORDER — PROMETHAZINE HCL 25 MG/ML IJ SOLN: 6.2500 mg | INTRAMUSCULAR | Status: DC | PRN

## 2022-04-21 MED ORDER — CHLORHEXIDINE GLUCONATE 4 % EX LIQD: 60.0000 mL | Freq: Once | CUTANEOUS | Status: DC

## 2022-04-21 MED ORDER — CEFAZOLIN SODIUM-DEXTROSE 2-4 GM/100ML-% IV SOLN
2.0000 g | INTRAVENOUS | Status: AC
  Administered 2022-04-21: 2 g via INTRAVENOUS
  Filled 2022-04-21: qty 100

## 2022-04-21 MED ORDER — DEXAMETHASONE SODIUM PHOSPHATE 10 MG/ML IJ SOLN
INTRAMUSCULAR | Status: DC | PRN
  Administered 2022-04-21: 5 mg via INTRAVENOUS

## 2022-04-21 MED ORDER — OXYCODONE-ACETAMINOPHEN 5-325 MG PO TABS: 1.0000 | ORAL_TABLET | Freq: Four times a day (QID) | ORAL | 0 refills | Status: DC | PRN

## 2022-04-21 MED ORDER — OXYCODONE HCL 5 MG PO TABS: 5.0000 mg | ORAL_TABLET | Freq: Once | ORAL | Status: AC | PRN

## 2022-04-21 MED ORDER — PROTAMINE SULFATE 10 MG/ML IV SOLN
INTRAVENOUS | Status: AC
  Filled 2022-04-21: qty 25

## 2022-04-21 MED ORDER — DEXMEDETOMIDINE HCL IN NACL 80 MCG/20ML IV SOLN
INTRAVENOUS | Status: DC | PRN
  Administered 2022-04-21: 10 ug via INTRAVENOUS

## 2022-04-21 MED ORDER — VITAMIN D3 10 MCG (400 UNIT) PO CAPS: 1.0000 | ORAL_CAPSULE | Freq: Every day | ORAL | 0 refills | Status: AC

## 2022-04-21 MED ORDER — HYDROMORPHONE HCL 1 MG/ML IJ SOLN: 0.2500 mg | INTRAMUSCULAR | Status: DC | PRN

## 2022-04-21 SURGICAL SUPPLY — 54 items
ADH SKN CLS APL DERMABOND .7 (GAUZE/BANDAGES/DRESSINGS) ×2
ADH SKN CLS LQ APL DERMABOND (GAUZE/BANDAGES/DRESSINGS) ×4
ARMBAND PINK RESTRICT EXTREMIT (MISCELLANEOUS) ×3 IMPLANT
BIOPATCH RED 1 DISK 7.0 (GAUZE/BANDAGES/DRESSINGS) ×3 IMPLANT
BLADE CLIPPER SURG (BLADE) ×3 IMPLANT
BNDG ELASTIC 4X5.8 VLCR STR LF (GAUZE/BANDAGES/DRESSINGS) ×3 IMPLANT
CANISTER SUCT 3000ML PPV (MISCELLANEOUS) ×3 IMPLANT
CATH PALINDROME-P 28CM W/VT (CATHETERS) IMPLANT
CLIP TI MEDIUM 24 (CLIP) ×3 IMPLANT
CLIP TI WIDE RED SMALL 24 (CLIP) ×3 IMPLANT
COVER PROBE W GEL 5X96 (DRAPES) ×3 IMPLANT
COVER SURGICAL LIGHT HANDLE (MISCELLANEOUS) ×3 IMPLANT
DERMABOND ADVANCED .7 DNX12 (GAUZE/BANDAGES/DRESSINGS) ×3 IMPLANT
DERMABOND ADVANCED .7 DNX6 (GAUZE/BANDAGES/DRESSINGS) IMPLANT
DRAPE ORTHO SPLIT 77X108 STRL (DRAPES) ×2
DRAPE SURG ORHT 6 SPLT 77X108 (DRAPES) IMPLANT
DRSG COVADERM 4X6 (GAUZE/BANDAGES/DRESSINGS) IMPLANT
ELECT REM PT RETURN 9FT ADLT (ELECTROSURGICAL) ×2
ELECTRODE REM PT RTRN 9FT ADLT (ELECTROSURGICAL) ×3 IMPLANT
GAUZE 4X4 16PLY ~~LOC~~+RFID DBL (SPONGE) ×3 IMPLANT
GLIDEWIRE ADV .035X180CM (WIRE) IMPLANT
GLOVE BIOGEL PI IND STRL 7.0 (GLOVE) IMPLANT
GLOVE BIOGEL PI IND STRL 8 (GLOVE) ×3 IMPLANT
GLOVE SURG SS PI 6.5 STRL IVOR (GLOVE) IMPLANT
GOWN STRL NON-REIN LRG LVL3 (GOWN DISPOSABLE) IMPLANT
GOWN STRL REUS W/ TWL LRG LVL3 (GOWN DISPOSABLE) ×6 IMPLANT
GOWN STRL REUS W/TWL 2XL LVL3 (GOWN DISPOSABLE) ×6 IMPLANT
GOWN STRL REUS W/TWL LRG LVL3 (GOWN DISPOSABLE) ×4
GRAFT GORETEX STRT 4-7X45 (Vascular Products) IMPLANT
HEMOSTAT SNOW SURGICEL 2X4 (HEMOSTASIS) IMPLANT
KIT BASIN OR (CUSTOM PROCEDURE TRAY) ×3 IMPLANT
KIT TURNOVER KIT B (KITS) ×3 IMPLANT
NDL 18GX1X1/2 (RX/OR ONLY) (NEEDLE) ×3 IMPLANT
NDL HYPO 25GX1X1/2 BEV (NEEDLE) ×3 IMPLANT
NEEDLE 18GX1X1/2 (RX/OR ONLY) (NEEDLE) ×2 IMPLANT
NEEDLE HYPO 25GX1X1/2 BEV (NEEDLE) ×2 IMPLANT
NS IRRIG 1000ML POUR BTL (IV SOLUTION) ×3 IMPLANT
PACK CV ACCESS (CUSTOM PROCEDURE TRAY) ×3 IMPLANT
PAD ARMBOARD 7.5X6 YLW CONV (MISCELLANEOUS) ×6 IMPLANT
SET MICROPUNCTURE 5F STIFF (MISCELLANEOUS) IMPLANT
SOAP 2 % CHG 4 OZ (WOUND CARE) ×3 IMPLANT
SUT ETHILON 3 0 PS 1 (SUTURE) ×3 IMPLANT
SUT MNCRL AB 4-0 PS2 18 (SUTURE) ×3 IMPLANT
SUT PROLENE 6 0 BV (SUTURE) ×3 IMPLANT
SUT SILK 2 0 PERMA HAND 18 BK (SUTURE) IMPLANT
SUT SILK 3 0 SH CR/8 (SUTURE) ×3 IMPLANT
SUT VIC AB 3-0 SH 27 (SUTURE) ×6
SUT VIC AB 3-0 SH 27X BRD (SUTURE) ×6 IMPLANT
SYR 10ML LL (SYRINGE) ×3 IMPLANT
TOWEL GREEN STERILE (TOWEL DISPOSABLE) ×3 IMPLANT
TOWEL GREEN STERILE FF (TOWEL DISPOSABLE) ×3 IMPLANT
UNDERPAD 30X36 HEAVY ABSORB (UNDERPADS AND DIAPERS) ×3 IMPLANT
WATER STERILE IRR 1000ML POUR (IV SOLUTION) ×3 IMPLANT
WIRE BENTSON .035X145CM (WIRE) IMPLANT

## 2022-04-21 NOTE — Op Note (Signed)
NAME: Mahan Gubbels    MRN: [id number] DOB: 11/01/1945    DATE OF OPERATION: 04/21/2022  PREOP DIAGNOSIS:    End stage renal disease  POSTOP DIAGNOSIS:    Same  PROCEDURE:    Left internal jugular tunneled dialysis catheter placement Right arm Brachial artery to axillary vein arteriovenous graft  SURGEON: Victorino Sparrow  ASSIST: Kayren Eaves, PA  ANESTHESIA: General   EBL: 44ml  INDICATIONS:    Linley Penaranda is a 77 y.o. male with history of CKD now with end stage renal disease, in need of dialysis.   FINDINGS:   15mm brachial artery 28mm axillary vein  TECHNIQUE:   Using ultrasound guidance the right internal jugular vein was accessed with micropuncture technique.  Through the micropuncture sheath a floppy J-wire was advanced into the superior vena cava.  A small incision was made around the skin access point.  A counterincision was made in the chest under the clavicle, medial to his port site.  A 28 cm tunneled dialysis catheter was then tunneled under the skin, over the clavicle into the incision in the neck.  The access point was serially dilated under direct fluoroscopic guidance.  A peel-away sheath was introduced into the superior vena cava under fluoroscopic guidance.  The tunneling device was removed and the catheter fed through the peel-away sheath into the superior vena cava.  The peel-away sheath was removed and the catheter gently pulled back.  Adequate position was confirmed with x-ray.  The catheter was tested and found to flush and draw back well.  Catheter was heparin locked.  Caps were applied.  Catheter was sutured to the skin.  The neck incision was closed with 4-0 Monocryl.  Next, I moved to AV graft creation. This portion of the procedure began with using ultrasound to insonate the brachial artery and axillary vein in the right arm.  Both proved of sufficient size to continue with AV graft.  A longitudinal incision was made at the distal  aspect of the humerus. This was a redo plane, therefore it took considerably longer to isolate the brachial artery. Sharp dissection was used. The brachial artery was identified, gently dissected, and encircled with silastic loops. A second incision was then made over the axillary vein and similarly dissected and encircled. The tunneling device was then passed between these incisions. A 4 X 7 taper PTFE graft was passed through the tunnel, taking care to avoid kinking or twists. The graft was irrigated with heparinized saline solution.   Proximal and distal arterial control was then obtained and a longitudinal arteriotomy was made on the brachial artery. The artery was irrigated with heparinized saline solution. An end to side artery anastomosis was then performed from the 98mm graft to the brachial vein in a running fashion using running 6-0 prolene suture. When the clamps were removed from the artery, the graft demonstrated excellent inflow. It was flushed with heparinized saline prior to being clamped. There was an excellent radial signal at the wrist.  Attention was then directed to the axillary vein anastomosis. Proximal and distal control were obtained and a longitudinal venotomy was made. The vein was then irrigated with heparinized saline solution. The end of the PTFE graft was then cut in a beveled fashion at the appropriate length. An end to side vein anastomosis was then performed, using two, running, 6-0 prolene sutures. Prior to establishing flow, all vessels were back bled and flushed to ensure there was no clot.   There was a  strong distal signal in the radial artery and a thrill in the vein centrally. Hemostasis was found to be satisfactory, and all wounds were irrigated with saline solution and closed in layers with vicryl and Monocryl at the skin. A dry sterile dressing was applied. Anesthetic care was terminated. The patient was transported to the recovery room in stable  condition.    Ladonna Snide, MD [facility] DATE OF DICTATION:   04/21/2022

## 2022-04-21 NOTE — Anesthesia Postprocedure Evaluation (Signed)
Anesthesia Post Note  Patient: Jacob [name]  Procedure(s) Performed: INSERTION OF RIGHT ARM ARTERIOVENOUS (AV) GORE-TEX GRAFT (Right: Arm Lower) INSERTION OF TUNNELED DIALYSIS CATHETER, LEFT INTERNAL JUGULAR (Left: Chest)     Patient location during evaluation: PACU Anesthesia Type: General Level of consciousness: awake and alert Pain management: pain level controlled Vital Signs Assessment: post-procedure vital signs reviewed and stable Respiratory status: spontaneous breathing, nonlabored ventilation and respiratory function stable Cardiovascular status: blood pressure returned to baseline and stable Postop Assessment: no apparent nausea or vomiting Anesthetic complications: no   No notable events documented.  Last Vitals:  Vitals:   04/21/22 1315 04/21/22 1330  BP: (!) 144/85 (!) 146/90  Pulse: 76 77  Resp: 11 12  Temp:  (!) 36.4 C  SpO2: 98% 100%    Last Pain:  Vitals:   04/21/22 0737  TempSrc:   PainSc: 0-No pain                 Lowella Curb

## 2022-04-21 NOTE — H&P (Signed)
Office Note   Patient seen and examined in preop holding.  No complaints. No changes to medication history or physical exam since last seen in clinic. After discussing the risks and benefits of right arm AV graft creation, Jacob [name] elected to proceed.   Victorino Sparrow MD   CC:  Need for new HD access.  Requesting Provider:  No ref. provider found  HPI: Jacob [name] is a Left handed 77 y.o. (02-Sep-1945) male with kidney disease who presents at the request of No ref. provider found for permanent HD access. The patient has had one prior access procedure - Left brachiobasilic fistula creation with subsequent transposition.  Unfortunate, his last nephrology appointment, the fistula was found to be thrombosed.  Jacob [name] was unsure when the fistula thrombosed.    On exam, Jacob [name] was doing well, accompanied by his son.  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 Since last seen roughly a year ago, he has declined significantly, with significant muscle loss in the lower extremities, with recent history of falls due to dizziness.     Past Medical History:  Diagnosis Date   Anemia    hx iron deficiency   Anxiety    Arthritis    gout   Back pain    BPH with obstruction/lower urinary tract symptoms    Chronic kidney disease    Stage 5 kidney   Constipation    Depression    Dyspnea    ED (erectile dysfunction)    Epigastric pain    GERD (gastroesophageal reflux disease)    Heartburn    History of blood transfusion    History of radiation therapy 11/03/11-12/29/11   prostate   Hypercholesterolemia    Hypertension    Night sweats    Oxygen deficiency    2x per week at night for sleep   Pneumonia    Post-operative nausea and vomiting 04/09/2016   Prostate cancer 08/14/2011   Adenocarcinoma,gleason:3+3=6,& 3+4=7,PSA=5.66   PUD (peptic ulcer disease)    Sleep apnea    does not use Cpap but occasionally uses O2 @ 2L qhs   Stomach cancer     Ulcer    peptic ulcer hx    Past Surgical History:  Procedure Laterality Date   AV FISTULA PLACEMENT Right 02/19/2021   Procedure: ARTERIOVENOUS (AV) FISTULA CREATION;  Surgeon: Victorino Sparrow, MD;  Location: [facility] OR;  Service: Vascular;  Laterality: Right;   BASCILIC VEIN TRANSPOSITION Right 07/23/2021   Procedure: RIGHT ARM BASILIC VEIN TRANSPOSITION;  Surgeon: Victorino Sparrow, MD;  Location: [facility] OR;  Service: Vascular;  Laterality: Right;  PERIPHERAL NERVE BLOCK   colon polyps bx  11/24/06   colon,transverse and rectosigmoid polyps:tubular adenomas and hyperplastic polyps,no high grade dysplasia or malignancy    COLONOSCOPY WITH PROPOFOL N/A 08/24/2019   Procedure: COLONOSCOPY WITH PROPOFOL;  Surgeon: Shellia Cleverly, DO;  Location: [facility];  Service: Gastroenterology;  Laterality: N/A;   duodenal bx  11/24/06   benign   ESOPHAGOGASTRODUODENOSCOPY N/A 10/27/2015   Procedure: ESOPHAGOGASTRODUODENOSCOPY (EGD);  Surgeon: Iva Boop, MD;  Location: [facility];  Service: Endoscopy;  Laterality: N/A;   EUS N/A 11/08/2015   Procedure: UPPER ENDOSCOPIC ULTRASOUND (EUS) RADIAL;  Surgeon: Rachael Fee, MD;  Location: [facility];  Service: Endoscopy;  Laterality: N/A;   GASTRECTOMY N/A 03/25/2016   Procedure: DISTAL GASTRECTOMY;  Surgeon: Almond Lint, MD;  Location: [facility] OR;  Service: General;  Laterality: N/A;   gastric bx  11/24/06  chronic active gastritis,with metaplasia and focal changaes of xanthelasma   GASTROSTOMY N/A 03/25/2016   Procedure: INSERTION OF FEEDING TUBE;  Surgeon: Almond Lint, MD;  Location: [facility] OR;  Service: General;  Laterality: N/A;   INSERTION PROSTATE RADIATION SEED  12-29-11   IR GENERIC HISTORICAL  04/01/2016   IR GJ TUBE CHANGE 04/01/2016 Richarda Overlie, MD [facility]   IR GENERIC HISTORICAL  04/05/2016   IR GJ TUBE CHANGE 04/05/2016 Richarda Overlie, MD [facility]   LAPAROSCOPIC GASTRECTOMY  03/25/2016   Diagnostic laparoscopy, distal gastrectomy with  Billroth 2 reconstruction and gastrojejunostomy tube   LAPAROSCOPY N/A 03/25/2016   Procedure: DIAGNOSTIC LAPAROSCOPY;  Surgeon: Almond Lint, MD;  Location: [facility] OR;  Service: General;  Laterality: N/A;   POLYPECTOMY  08/24/2019   Procedure: POLYPECTOMY;  Surgeon: Shellia Cleverly, DO;  Location: [facility];  Service: Gastroenterology;;   PORTACATH PLACEMENT Left 12/04/2015   Procedure: INSERTION PORT-A-CATH;  Surgeon: Almond Lint, MD;  Location: [facility] OR;  Service: General;  Laterality: Left;   PROSTATE BIOPSY  08/14/11   Adenocarcinoma/volume=58.51cc,gleason=3+3=6 & 3+4=7   TONSILLECTOMY     77 years old    Social History   Socioeconomic History   Marital status: Married    Spouse name: Jacob [name]   Number of children: 3   Years of education: 12   Highest education level: Not on file  Occupational History   Occupation: retired    Associate Professor: [employer]  Tobacco Use   Smoking status: Former    Packs/day: 1.50    Years: 30.00    Additional pack years: 0.00    Total pack years: 45.00    Types: Cigarettes    Quit date: 04/13/2000    Years since quitting: 22.0    Passive exposure: Past   Smokeless tobacco: Never  Vaping Use   Vaping Use: Never used  Substance and Sexual Activity   Alcohol use: Not Currently    Alcohol/week: 1.0 standard drink of alcohol    Types: 1 Cans of beer per week    Comment: a beer about once a week, less than in his youth   Drug use: No   Sexual activity: Not Currently  Other Topics Concern   Not on file  Social History Narrative   Patient is married Jacob [name]) and lives at home with his wife and one child.   Patient has three children.   Patient is retired.   Patient has a high school education.   Patient is ambi-dextrous.   Patient drinks two cups of coffee about three times a week.    Social Determinants of Health   Financial Resource Strain: Not on file  Food Insecurity: Not on file  Transportation Needs: Not on file  Physical Activity: Not  on file  Stress: Not on file  Social Connections: Not on file  Intimate Partner Violence: Not on file   Family History  Problem Relation Age of Onset   Cancer Mother        NOS   Alcohol abuse Father    Alcohol abuse Brother 25   Cancer Paternal Aunt        NOS   Colon cancer Neg Hx     Current Facility-Administered Medications  Medication Dose Route Frequency Provider Last Rate Last Admin   0.9 %  sodium chloride infusion   Intravenous Continuous Victorino Sparrow, MD       0.9 %  sodium chloride infusion   Intravenous Continuous Lowella Curb, MD 10  mL/hr at 04/21/22 0741 New Bag at 04/21/22 0741   0.9 % irrigation (POUR BTL)    PRN Victorino Sparrow, MD   1,000 mL at 04/21/22 1000   ceFAZolin (ANCEF) IVPB 2g/100 mL premix  2 g Intravenous 30 min Pre-Op Victorino Sparrow, MD       chlorhexidine (HIBICLENS) 4 % liquid 4 Application  60 mL Topical Once Victorino Sparrow, MD       And   [START ON 04/22/2022] chlorhexidine (HIBICLENS) 4 % liquid 4 Application  60 mL Topical Once Victorino Sparrow, MD       heparin 6000 units / NS 500 mL irrigation    PRN Victorino Sparrow, MD   1 Application at 04/21/22 [id number]    Allergies  Allergen Reactions   Lisinopril Cough     REVIEW OF SYSTEMS:  [X]  denotes positive finding, [ ]  denotes negative finding Cardiac  Comments:  Chest pain or chest pressure:    Shortness of breath upon exertion:    Short of breath when lying flat:    Irregular heart rhythm:        Vascular    Pain in calf, thigh, or hip brought on by ambulation:    Pain in feet at night that wakes you up from your sleep:     Blood clot in your veins:    Leg swelling:         Pulmonary    Oxygen at home:    Productive cough:     Wheezing:         Neurologic    Sudden weakness in arms or legs:     Sudden numbness in arms or legs:     Sudden onset of difficulty speaking or slurred speech:    Temporary loss of vision in one eye:     Problems with dizziness:          Gastrointestinal    Blood in stool:     Vomited blood:         Genitourinary    Burning when urinating:     Blood in urine:        Psychiatric    Major depression:         Hematologic    Bleeding problems:    Problems with blood clotting too easily:        Skin    Rashes or ulcers:        Constitutional    Fever or chills:      PHYSICAL EXAMINATION:  Vitals:   04/21/22 0716  BP: (!) 151/94  Pulse: 76  Resp: 18  Temp: 98.7 F (37.1 C)  TempSrc: Oral  SpO2: 99%  Weight: 86.2 kg  Height: 6\' 2"  (1.88 m)    General:  WDWN in NAD; vital signs documented above Gait: Not observed HENT: WNL, normocephalic Pulmonary: normal non-labored breathing , without Rales, rhonchi,  wheezing Cardiac: regular HR Abdomen: soft, NT, no masses Skin: without rashes Vascular Exam/Pulses:  Right Left  Radial 2+ (normal) 2+ (normal)  Ulnar 2+ (normal) 2+ (normal)                   Extremities: without ischemic changes, without Gangrene , without cellulitis; without open wounds;  Some bilateral lower extremity edema Musculoskeletal: no muscle wasting or atrophy  Neurologic: A&O X 3;  No focal weakness or paresthesias are detected Psychiatric:  The pt has Normal affect.   Non-Invasive Vascular Imaging:   Small  cephalic veins bilaterally, right brachiobasilic fistula thrombosed, left basilic vein of adequate size, not visualized at the level of the shoulder.    ASSESSMENT/PLAN:  Jacob [name] is a 77 y.o. male who presents with chronic kidney disease stage 5  Based on vein mapping and examination, Jacob [name] is a candidate for right-sided AV graft versus left-sided brachiobasilic fistula. Being that he is left-handed, and that the left basilic vein appears small in the axilla, I think he would be best suited with right arm AV graft. I had an extensive discussion with this patient in regards to the nature of access surgery, including risk, benefits, and alternatives.   The  patient is aware that the risks of access surgery include but are not limited to: bleeding, infection, steal syndrome, nerve damage, ischemic monomelic neuropathy, failure of access to mature, complications related to venous hypertension, and possible need for additional access procedures in the future. Paperwork asked that in the setting of graft, we hold until the patient's kidney function worsens.  I am happy to set up this surgery once deemed appropriate by nephrology.  Victorino SparrowJoshua E Hajime Asfaw, MD [facility] [phone] Total time of patient care including pre-visit research, consultation, and documentation greater than 30 minutes

## 2022-04-21 NOTE — Transfer of Care (Signed)
Immediate Anesthesia Transfer of Care Note  Patient: Jacob [name]  Procedure(s) Performed: INSERTION OF RIGHT ARM ARTERIOVENOUS (AV) GORE-TEX GRAFT (Right: Arm Lower) INSERTION OF TUNNELED DIALYSIS CATHETER, RIGHT INTERNAL JUGULAR (Chest)  Patient Location: PACU  Anesthesia Type:General  Level of Consciousness: awake, alert , and oriented  Airway & Oxygen Therapy: Patient Spontanous Breathing and Patient connected to nasal cannula oxygen  Post-op Assessment: Report given to RN and Post -op Vital signs reviewed and stable  Post vital signs: Reviewed and stable  Last Vitals:  Vitals Value Taken Time  BP 141/89 04/21/22 1257  Temp    Pulse 77 04/21/22 1258  Resp 22 04/21/22 1258  SpO2 98 % 04/21/22 1258  Vitals shown include unvalidated device data.  Last Pain:  Vitals:   04/21/22 0737  TempSrc:   PainSc: 0-No pain         Complications: No notable events documented.

## 2022-04-21 NOTE — Anesthesia Preprocedure Evaluation (Signed)
Anesthesia Evaluation  Patient identified by MRN, date of birth, ID band Patient awake    Reviewed: Allergy & Precautions, NPO status , Patient's Chart, lab work & pertinent test results, reviewed documented beta blocker date and time   History of Anesthesia Complications (+) PONV and history of anesthetic complications  Airway Mallampati: III  TM Distance: >3 FB Neck ROM: Full  Mouth opening: Limited Mouth Opening  Dental  (+) Edentulous Upper, Edentulous Lower, Dental Advisory Given   Pulmonary sleep apnea and Oxygen sleep apnea , former smoker   Pulmonary exam normal breath sounds clear to auscultation       Cardiovascular hypertension, Pt. on medications and Pt. on home beta blockers + angina  Normal cardiovascular exam Rhythm:Regular Rate:Normal  TTE 2022 1. Left ventricular ejection fraction, by estimation, is 50 to 55%. Left  ventricular ejection fraction by PLAX is 50 %. The left ventricle has low  normal function. The left ventricle has no regional wall motion  abnormalities. Left ventricular diastolic  parameters are consistent with Grade I diastolic dysfunction (impaired  relaxation).  2. Right ventricular systolic function is normal. The right ventricular  size is normal. There is normal pulmonary artery systolic pressure. The  estimated right ventricular systolic pressure is 16.4 mmHg.  3. The mitral valve is abnormal. Mild mitral valve regurgitation.  4. The aortic valve is tricuspid. Aortic valve regurgitation is not  visualized. Aortic valve sclerosis is present, with no evidence of aortic  valve stenosis.  5. The inferior vena cava is normal in size with greater than 50%  respiratory variability, suggesting right atrial pressure of 3 mmHg.   Nuclear stress 12/25/2020: . Findings are consistent with ischemia. . No ST deviation was noted. . Left ventricular function is normal. End diastolic cavity size  is normal. . LV perfusion is abnormal. There is evidence of ischemia. Defect 1: There is a medium defect with moderate reduction in uptake present in the mid to basal inferior and inferolateral location(s) that is reversible. There is normal wall motion in the defect area. Consistent with ischemia. . Consistent with ischemia.The study is intermediate risk. . Prior study not available for comparison Plan for medical management. No cath at this time given CKD.    Neuro/Psych  PSYCHIATRIC DISORDERS Anxiety Depression    CVA    GI/Hepatic Neg liver ROS, PUD,GERD  ,,  Endo/Other  negative endocrine ROS    Renal/GU ESRFRenal disease  negative genitourinary   Musculoskeletal  (+) Arthritis , Osteoarthritis,    Abdominal   Peds  Hematology  (+) Blood dyscrasia (multiple myeloma), anemia   Anesthesia Other Findings   Reproductive/Obstetrics                             Anesthesia Physical Anesthesia Plan  ASA: 4  Anesthesia Plan: General   Post-op Pain Management: Dilaudid IV   Induction: Intravenous  PONV Risk Score and Plan: 3 and Ondansetron, Dexamethasone, Midazolam and Treatment may vary due to age or medical condition  Airway Management Planned: LMA  Additional Equipment:   Intra-op Plan:   Post-operative Plan: Extubation in OR  Informed Consent: I have reviewed the patients History and Physical, chart, labs and discussed the procedure including the risks, benefits and alternatives for the proposed anesthesia with the patient or authorized representative who has indicated his/her understanding and acceptance.     Dental advisory given  Plan Discussed with: CRNA  Anesthesia Plan Comments: (   )  Anesthesia Quick Evaluation  

## 2022-04-21 NOTE — Anesthesia Procedure Notes (Signed)
Procedure Name: LMA Insertion Date/Time: 04/21/2022 10:28 AM  Performed by: Gus Puma, CRNAPre-anesthesia Checklist: Patient identified, Emergency Drugs available, Suction available and Patient being monitored Patient Re-evaluated:Patient Re-evaluated prior to induction Oxygen Delivery Method: Circle System Utilized Preoxygenation: Pre-oxygenation with 100% oxygen Induction Type: IV induction Ventilation: Mask ventilation without difficulty LMA: LMA inserted LMA Size: 5.0 Number of attempts: 1 Airway Equipment and Method: Bite block Placement Confirmation: positive ETCO2 Tube secured with: Tape Dental Injury: Teeth and Oropharynx as per pre-operative assessment

## 2022-04-21 NOTE — Discharge Instructions (Signed)
[facility]  Discharge Instructions  AV Fistula or Graft Surgery for Dialysis Access  Please refer to the following instructions for your post-procedure care. Your surgeon or physician assistant will discuss any changes with you.  Activity  You may drive the day following your surgery, if you are comfortable and no longer taking prescription pain medication. Resume full activity as the soreness in your incision resolves.  Bathing/Showering  You may shower after you go home. Keep your incision dry for 48 hours. Do not soak in a bathtub, hot tub, or swim until the incision heals completely. You may not shower if you have a hemodialysis catheter.  Incision Care  Clean your incision with mild soap and water after 48 hours. Pat the area dry with a clean towel. You do not need a bandage unless otherwise instructed. Do not apply any ointments or creams to your incision. You may have skin glue on your incision. Do not peel it off. It will come off on its own in about one week. Your arm may swell a bit after surgery. To reduce swelling use pillows to elevate your arm so it is above your heart. Your doctor will tell you if you need to lightly wrap your arm with an ACE bandage.  Diet  Resume your normal diet. There are not special food restrictions following this procedure. In order to heal from your surgery, it is CRITICAL to get adequate nutrition. Your body requires vitamins, minerals, and protein. Vegetables are the best source of vitamins and minerals. Vegetables also provide the perfect balance of protein. Processed food has little nutritional value, so try to avoid this.  Medications  Resume taking all of your medications. If your incision is causing pain, you may take over-the counter pain relievers such as acetaminophen (Tylenol). If you were prescribed a stronger pain medication, please be aware these medications can cause nausea and constipation. Prevent  nausea by taking the medication with a snack or meal. Avoid constipation by drinking plenty of fluids and eating foods with high amount of fiber, such as fruits, vegetables, and grains.  Do not take Tylenol if you are taking prescription pain medications.  Follow up Your surgeon may want to see you in the office following your access surgery. If so, this will be arranged at the time of your surgery.  Please call us immediately for any of the following conditions:  Increased pain, redness, drainage (pus) from your incision site Fever of 101 degrees or higher Severe or worsening pain at your incision site Hand pain or numbness.  Reduce your risk of vascular disease:  Stop smoking. If you would like help, call [facility] at 1-800-QUIT-NOW (775-575-9126) or [place] at (873)060-8795  Manage your cholesterol Maintain a desired weight Control your diabetes Keep your blood pressure down  Dialysis  It will take several weeks to several months for your new dialysis access to be ready for use. Your surgeon will determine when it is okay to use it. Your nephrologist will continue to direct your dialysis. You can continue to use your Permcath until your new access is ready for use.   04/21/2022 Jacob [name] [id number] 08-07-1945  Surgeon(s): Victorino Sparrow, MD  Procedure(s): INSERTION OF RIGHT ARM ARTERIOVENOUS (AV) GORE-TEX GRAFT INSERTION OF TUNNELED DIALYSIS CATHETER, RIGHT INTERNAL JUGULAR   May stick graft immediately   May stick graft on designated area only:   x Do not stick graft for 8 weeks    If you  have any questions, please call the office at [phone].

## 2022-04-22 ENCOUNTER — Encounter (HOSPITAL_COMMUNITY): Payer: Self-pay | Admitting: Vascular Surgery

## 2022-05-05 ENCOUNTER — Encounter (HOSPITAL_COMMUNITY): Payer: Medicare Other

## 2022-05-23 ENCOUNTER — Ambulatory Visit (INDEPENDENT_AMBULATORY_CARE_PROVIDER_SITE_OTHER): Payer: Medicare Other | Admitting: Physician Assistant

## 2022-05-23 ENCOUNTER — Encounter: Payer: Self-pay | Admitting: Physician Assistant

## 2022-05-23 VITALS — BP 111/73 | HR 87 | Temp 97.6°F | Resp 16 | Ht 74.0 in | Wt 187.0 lb

## 2022-05-23 DIAGNOSIS — N186 End stage renal disease: Secondary | ICD-10-CM

## 2022-05-23 NOTE — Progress Notes (Signed)
POST OPERATIVE OFFICE NOTE    CC:  F/u for surgery  HPI:  This is a 77 y.o. male who is s/p RUA AVG on 04/21/2022 by Dr. Karin Lieu as well as [facility] placement.    Dialysis access hx: -right 1st stage BVT 02/19/2021 -right 2nd stage BVT7/11/2021 Dr. Karin Lieu -RUA AVG 04/21/2022 Dr. Karin Lieu  Pt states he does not have pain/numbness in the right hand.  He says his catheter is working well.  The pt is on dialysis M/W/F [address].   Allergies  Allergen Reactions   Lisinopril Cough    Current Outpatient Medications  Medication Sig Dispense Refill   acetaminophen (TYLENOL) 500 MG tablet Take 1,000 mg by mouth daily as needed for moderate pain or headache.     allopurinol (ZYLOPRIM) 300 MG tablet Take 150 mg by mouth daily.     aspirin EC 81 MG tablet Take 1 tablet (81 mg total) by mouth daily. Swallow whole. 90 tablet 3   azithromycin (ZITHROMAX) 250 MG tablet Take 1 tablet (250 mg total) by mouth daily. Take first 2 tablets together, then 1 every day until finished. 6 tablet 0   buPROPion (WELLBUTRIN XL) 300 MG 24 hr tablet Take 300 mg by mouth daily.      calcium carbonate (TUMS - DOSED IN MG ELEMENTAL CALCIUM) 500 MG chewable tablet Chew 3 tablets by mouth 2 (two) times daily as needed for indigestion or heartburn.     cetirizine (ZYRTEC) 10 MG tablet Take 10 mg by mouth daily.     Cholecalciferol (VITAMIN D3) 10 MCG (400 UNIT) CAPS Take 1 capsule (400 Units total) by mouth daily. 30 capsule 0   diclofenac Sodium (VOLTAREN) 1 % GEL Apply 2 g topically 4 (four) times daily. 100 g 1   donepezil (ARICEPT) 5 MG tablet Take 1 tablet (5 mg total) by mouth at bedtime. 30 tablet 6   ferrous sulfate 325 (65 FE) MG tablet Take 325 mg by mouth daily with breakfast.     furosemide (LASIX) 40 MG tablet Take 40 mg by mouth daily.     hydrALAZINE (APRESOLINE) 50 MG tablet Take 50 mg by mouth 3 (three) times daily.     lidocaine (XYLOCAINE) 5 % ointment Apply 1 application topically as needed. 35.44 g  0   Melatonin 10 MG TABS Take 10 mg by mouth at bedtime.     Nebivolol HCl 20 MG TABS Take 20 mg by mouth at bedtime.     oxyCODONE-acetaminophen (PERCOCET) 5-325 MG tablet Take 1 tablet by mouth every 6 (six) hours as needed for severe pain. 20 tablet 0   pantoprazole (PROTONIX) 40 MG tablet Take 1 tablet (40 mg total) by mouth daily before supper. 30 tablet 0   polyethylene glycol powder (GLYCOLAX/MIRALAX) 17 GM/SCOOP powder Take 17 g by mouth 2 (two) times daily. Until daily soft stools OTC 225 g 0   rosuvastatin (CRESTOR) 5 MG tablet Take 5 mg by mouth daily.     sodium bicarbonate 650 MG tablet Take 650 mg by mouth 2 (two) times daily.     sodium zirconium cyclosilicate (LOKELMA) 10 g PACK packet Take 10 g by mouth every other day.     tamsulosin (FLOMAX) 0.4 MG CAPS capsule Take 1 capsule by mouth every day 30 capsule 11   venlafaxine XR (EFFEXOR-XR) 75 MG 24 hr capsule Take 75 mg by mouth daily.     verapamil (CALAN) 80 MG tablet Take 80 mg by mouth 3 (three) times  daily.      No current facility-administered medications for this visit.     ROS:  See HPI  Physical Exam:  Today's Vitals   05/23/22 1421  BP: 111/73  Pulse: 87  Resp: 16  Temp: 97.6 F (36.4 C)  TempSrc: Temporal  SpO2: 98%  Weight: 187 lb (84.8 kg)  Height: 6\' 2"  (1.88 m)   Body mass index is 24.01 kg/m.   Incision:  both incisions have healed nicely Extremities:   There is a palpable right radial pulse.   Motor and sensory are in tact.   There is a thrill/bruit present.  Access is  easily palpable   Assessment/Plan:  This is a 77 y.o. male who is s/p: RUA AVG on 04/21/2022 by Dr. Karin Lieu as well as [facility] placement.    -the pt does not have evidence of steal. -pt's access can be used 05/26/2022. -if pt has tunneled catheter, this can be removed at the discretion of the dialysis center once the pt's access has been successfully cannulated to their satisfaction.  -discussed with pt that access does not  last forever and will need intervention or even new access at some point.  -the pt will follow up as needed.   Doreatha Massed, [facility] [phone]  Clinic MD:  Karin Lieu

## 2022-06-02 ENCOUNTER — Inpatient Hospital Stay (HOSPITAL_COMMUNITY): Admission: RE | Admit: 2022-06-02 | Payer: Medicare Other | Source: Ambulatory Visit

## 2022-06-14 ENCOUNTER — Other Ambulatory Visit: Payer: Self-pay | Admitting: Urology

## 2022-06-30 ENCOUNTER — Encounter (HOSPITAL_COMMUNITY): Payer: Medicare Other

## 2022-07-02 ENCOUNTER — Emergency Department (HOSPITAL_COMMUNITY): Payer: Medicare Other

## 2022-07-02 ENCOUNTER — Inpatient Hospital Stay (HOSPITAL_COMMUNITY)
Admission: EM | Admit: 2022-07-02 | Discharge: 2022-07-09 | DRG: 480 | Disposition: A | Discharge status: Skilled Nursing Facility | Payer: Medicare Other | Attending: Family Medicine | Admitting: Family Medicine

## 2022-07-02 ENCOUNTER — Encounter (HOSPITAL_COMMUNITY): Payer: Self-pay | Admitting: Internal Medicine

## 2022-07-02 DIAGNOSIS — Z992 Dependence on renal dialysis: Secondary | ICD-10-CM

## 2022-07-02 DIAGNOSIS — G4736 Sleep related hypoventilation in conditions classified elsewhere: Secondary | ICD-10-CM | POA: Diagnosis present

## 2022-07-02 DIAGNOSIS — N2581 Secondary hyperparathyroidism of renal origin: Secondary | ICD-10-CM | POA: Diagnosis present

## 2022-07-02 DIAGNOSIS — Z8781 Personal history of (healed) traumatic fracture: Secondary | ICD-10-CM | POA: Diagnosis not present

## 2022-07-02 DIAGNOSIS — S52021A Displaced fracture of olecranon process without intraarticular extension of right ulna, initial encounter for closed fracture: Secondary | ICD-10-CM

## 2022-07-02 DIAGNOSIS — E538 Deficiency of other specified B group vitamins: Secondary | ICD-10-CM | POA: Diagnosis present

## 2022-07-02 DIAGNOSIS — Z923 Personal history of irradiation: Secondary | ICD-10-CM | POA: Diagnosis not present

## 2022-07-02 DIAGNOSIS — I10 Essential (primary) hypertension: Secondary | ICD-10-CM | POA: Diagnosis present

## 2022-07-02 DIAGNOSIS — E782 Mixed hyperlipidemia: Secondary | ICD-10-CM | POA: Diagnosis present

## 2022-07-02 DIAGNOSIS — Z8546 Personal history of malignant neoplasm of prostate: Secondary | ICD-10-CM | POA: Diagnosis not present

## 2022-07-02 DIAGNOSIS — Z87891 Personal history of nicotine dependence: Secondary | ICD-10-CM | POA: Diagnosis not present

## 2022-07-02 DIAGNOSIS — N186 End stage renal disease: Secondary | ICD-10-CM | POA: Diagnosis present

## 2022-07-02 DIAGNOSIS — N401 Enlarged prostate with lower urinary tract symptoms: Secondary | ICD-10-CM | POA: Diagnosis present

## 2022-07-02 DIAGNOSIS — Z79899 Other long term (current) drug therapy: Secondary | ICD-10-CM | POA: Diagnosis not present

## 2022-07-02 DIAGNOSIS — D62 Acute posthemorrhagic anemia: Secondary | ICD-10-CM | POA: Diagnosis not present

## 2022-07-02 DIAGNOSIS — S72009A Fracture of unspecified part of neck of unspecified femur, initial encounter for closed fracture: Secondary | ICD-10-CM | POA: Diagnosis present

## 2022-07-02 DIAGNOSIS — I120 Hypertensive chronic kidney disease with stage 5 chronic kidney disease or end stage renal disease: Secondary | ICD-10-CM | POA: Diagnosis present

## 2022-07-02 DIAGNOSIS — M1990 Unspecified osteoarthritis, unspecified site: Secondary | ICD-10-CM | POA: Diagnosis present

## 2022-07-02 DIAGNOSIS — D631 Anemia in chronic kidney disease: Secondary | ICD-10-CM | POA: Diagnosis present

## 2022-07-02 DIAGNOSIS — S52021P Displaced fracture of olecranon process without intraarticular extension of right ulna, subsequent encounter for closed fracture with malunion: Secondary | ICD-10-CM | POA: Diagnosis not present

## 2022-07-02 DIAGNOSIS — G4734 Idiopathic sleep related nonobstructive alveolar hypoventilation: Secondary | ICD-10-CM | POA: Diagnosis present

## 2022-07-02 DIAGNOSIS — N400 Enlarged prostate without lower urinary tract symptoms: Secondary | ICD-10-CM

## 2022-07-02 DIAGNOSIS — Z809 Family history of malignant neoplasm, unspecified: Secondary | ICD-10-CM

## 2022-07-02 DIAGNOSIS — K219 Gastro-esophageal reflux disease without esophagitis: Secondary | ICD-10-CM | POA: Diagnosis present

## 2022-07-02 DIAGNOSIS — N138 Other obstructive and reflux uropathy: Secondary | ICD-10-CM | POA: Diagnosis present

## 2022-07-02 DIAGNOSIS — F419 Anxiety disorder, unspecified: Secondary | ICD-10-CM | POA: Diagnosis present

## 2022-07-02 DIAGNOSIS — M898X9 Other specified disorders of bone, unspecified site: Secondary | ICD-10-CM | POA: Diagnosis present

## 2022-07-02 DIAGNOSIS — Z9981 Dependence on supplemental oxygen: Secondary | ICD-10-CM

## 2022-07-02 DIAGNOSIS — S72001A Fracture of unspecified part of neck of right femur, initial encounter for closed fracture: Secondary | ICD-10-CM | POA: Diagnosis not present

## 2022-07-02 DIAGNOSIS — Z8719 Personal history of other diseases of the digestive system: Secondary | ICD-10-CM

## 2022-07-02 DIAGNOSIS — G4730 Sleep apnea, unspecified: Secondary | ICD-10-CM | POA: Diagnosis not present

## 2022-07-02 DIAGNOSIS — S72001P Fracture of unspecified part of neck of right femur, subsequent encounter for closed fracture with malunion: Secondary | ICD-10-CM | POA: Diagnosis not present

## 2022-07-02 DIAGNOSIS — F0393 Unspecified dementia, unspecified severity, with mood disturbance: Secondary | ICD-10-CM | POA: Diagnosis present

## 2022-07-02 DIAGNOSIS — G4733 Obstructive sleep apnea (adult) (pediatric): Secondary | ICD-10-CM | POA: Diagnosis present

## 2022-07-02 DIAGNOSIS — M109 Gout, unspecified: Secondary | ICD-10-CM | POA: Diagnosis present

## 2022-07-02 DIAGNOSIS — Z751 Person awaiting admission to adequate facility elsewhere: Secondary | ICD-10-CM

## 2022-07-02 DIAGNOSIS — Z811 Family history of alcohol abuse and dependence: Secondary | ICD-10-CM

## 2022-07-02 DIAGNOSIS — Z7982 Long term (current) use of aspirin: Secondary | ICD-10-CM | POA: Diagnosis not present

## 2022-07-02 DIAGNOSIS — W010XXA Fall on same level from slipping, tripping and stumbling without subsequent striking against object, initial encounter: Secondary | ICD-10-CM | POA: Diagnosis present

## 2022-07-02 DIAGNOSIS — Z888 Allergy status to other drugs, medicaments and biological substances status: Secondary | ICD-10-CM

## 2022-07-02 DIAGNOSIS — Z85028 Personal history of other malignant neoplasm of stomach: Secondary | ICD-10-CM

## 2022-07-02 DIAGNOSIS — F32A Depression, unspecified: Secondary | ICD-10-CM | POA: Diagnosis present

## 2022-07-02 DIAGNOSIS — Y92538 Other ambulatory health services establishments as the place of occurrence of the external cause: Secondary | ICD-10-CM | POA: Diagnosis present

## 2022-07-02 DIAGNOSIS — S72141A Displaced intertrochanteric fracture of right femur, initial encounter for closed fracture: Secondary | ICD-10-CM | POA: Diagnosis present

## 2022-07-02 DIAGNOSIS — Z8711 Personal history of peptic ulcer disease: Secondary | ICD-10-CM

## 2022-07-02 DIAGNOSIS — W19XXXA Unspecified fall, initial encounter: Secondary | ICD-10-CM

## 2022-07-02 HISTORY — DX: Anxiety disorder, unspecified: F41.9

## 2022-07-02 HISTORY — DX: Depression, unspecified: F32.A

## 2022-07-02 LAB — CBC WITH DIFFERENTIAL/PLATELET
Abs Immature Granulocytes: 0.03 10*3/uL (ref 0.00–0.07)
Basophils Absolute: 0 10*3/uL (ref 0.0–0.1)
Basophils Relative: 1 %
Eosinophils Absolute: 0.3 10*3/uL (ref 0.0–0.5)
Eosinophils Relative: 3 %
HCT: 28.5 % — ABNORMAL LOW (ref 39.0–52.0)
Hemoglobin: 9.2 g/dL — ABNORMAL LOW (ref 13.0–17.0)
Immature Granulocytes: 0 %
Lymphocytes Relative: 12 %
Lymphs Abs: 1 10*3/uL (ref 0.7–4.0)
MCH: 32.3 pg (ref 26.0–34.0)
MCHC: 32.3 g/dL (ref 30.0–36.0)
MCV: 100 fL (ref 80.0–100.0)
Monocytes Absolute: 0.6 10*3/uL (ref 0.1–1.0)
Monocytes Relative: 7 %
Neutro Abs: 6.2 10*3/uL (ref 1.7–7.7)
Neutrophils Relative %: 77 %
Platelets: 187 10*3/uL (ref 150–400)
RBC: 2.85 MIL/uL — ABNORMAL LOW (ref 4.22–5.81)
RDW: 14.7 % (ref 11.5–15.5)
WBC: 8.1 10*3/uL (ref 4.0–10.5)
nRBC: 0 % (ref 0.0–0.2)

## 2022-07-02 LAB — I-STAT CHEM 8, ED
BUN: 34 mg/dL — ABNORMAL HIGH (ref 8–23)
Calcium, Ion: 0.88 mmol/L — CL (ref 1.15–1.40)
Chloride: 100 mmol/L (ref 98–111)
Creatinine, Ser: 7.1 mg/dL — ABNORMAL HIGH (ref 0.61–1.24)
Glucose, Bld: 95 mg/dL (ref 70–99)
HCT: 28 % — ABNORMAL LOW (ref 39.0–52.0)
Hemoglobin: 9.5 g/dL — ABNORMAL LOW (ref 13.0–17.0)
Potassium: 3.9 mmol/L (ref 3.5–5.1)
Sodium: 135 mmol/L (ref 135–145)
TCO2: 31 mmol/L (ref 22–32)

## 2022-07-02 LAB — HEPATITIS B SURFACE ANTIGEN: Hepatitis B Surface Ag: NONREACTIVE

## 2022-07-02 LAB — COMPREHENSIVE METABOLIC PANEL
ALT: 8 U/L (ref 0–44)
AST: 13 U/L — ABNORMAL LOW (ref 15–41)
Albumin: 2.8 g/dL — ABNORMAL LOW (ref 3.5–5.0)
Alkaline Phosphatase: 46 U/L (ref 38–126)
Anion gap: 13 (ref 5–15)
BUN: 36 mg/dL — ABNORMAL HIGH (ref 8–23)
CO2: 28 mmol/L (ref 22–32)
Calcium: 8.8 mg/dL — ABNORMAL LOW (ref 8.9–10.3)
Chloride: 96 mmol/L — ABNORMAL LOW (ref 98–111)
Creatinine, Ser: 6.41 mg/dL — ABNORMAL HIGH (ref 0.61–1.24)
GFR, Estimated: 8 mL/min — ABNORMAL LOW (ref >60)
Glucose, Bld: 96 mg/dL (ref 70–99)
Potassium: 4 mmol/L (ref 3.5–5.1)
Sodium: 137 mmol/L (ref 135–145)
Total Bilirubin: 0.5 mg/dL (ref 0.3–1.2)
Total Protein: 6.1 g/dL — ABNORMAL LOW (ref 6.5–8.1)

## 2022-07-02 LAB — TYPE AND SCREEN: Antibody Screen: NEGATIVE

## 2022-07-02 MED ORDER — CINACALCET HCL 30 MG PO TABS
30.0000 mg | ORAL_TABLET | ORAL | Status: DC
  Administered 2022-07-04 – 2022-07-09 (×3): 30 mg via ORAL
  Filled 2022-07-02 (×3): qty 1

## 2022-07-02 MED ORDER — HYDROXYZINE HCL 25 MG PO TABS: 25.0000 mg | ORAL_TABLET | Freq: Three times a day (TID) | ORAL | Status: DC | PRN

## 2022-07-02 MED ORDER — ONDANSETRON HCL 4 MG/2ML IJ SOLN
4.0000 mg | Freq: Four times a day (QID) | INTRAMUSCULAR | Status: DC | PRN
  Administered 2022-07-03: 4 mg via INTRAVENOUS

## 2022-07-02 MED ORDER — ONDANSETRON HCL 4 MG/2ML IJ SOLN
4.0000 mg | Freq: Once | INTRAMUSCULAR | Status: AC
  Administered 2022-07-02: 4 mg via INTRAVENOUS
  Filled 2022-07-02: qty 2

## 2022-07-02 MED ORDER — ROSUVASTATIN CALCIUM 5 MG PO TABS
5.0000 mg | ORAL_TABLET | Freq: Every day | ORAL | Status: DC
  Administered 2022-07-04 – 2022-07-09 (×6): 5 mg via ORAL
  Filled 2022-07-02 (×6): qty 1

## 2022-07-02 MED ORDER — ACETAMINOPHEN 650 MG RE SUPP: 650.0000 mg | Freq: Four times a day (QID) | RECTAL | Status: DC | PRN

## 2022-07-02 MED ORDER — MORPHINE SULFATE (PF) 4 MG/ML IV SOLN
4.0000 mg | Freq: Once | INTRAVENOUS | Status: AC
  Administered 2022-07-02: 4 mg via INTRAVENOUS
  Filled 2022-07-02: qty 1

## 2022-07-02 MED ORDER — ALLOPURINOL 300 MG PO TABS
150.0000 mg | ORAL_TABLET | Freq: Every day | ORAL | Status: DC
  Administered 2022-07-04 – 2022-07-09 (×6): 150 mg via ORAL
  Filled 2022-07-02 (×7): qty 1

## 2022-07-02 MED ORDER — METHOCARBAMOL 500 MG PO TABS
500.0000 mg | ORAL_TABLET | Freq: Four times a day (QID) | ORAL | Status: DC | PRN
  Administered 2022-07-02 – 2022-07-04 (×4): 500 mg via ORAL
  Filled 2022-07-02 (×4): qty 1

## 2022-07-02 MED ORDER — TAMSULOSIN HCL 0.4 MG PO CAPS: 0.4000 mg | ORAL_CAPSULE | Freq: Every day | ORAL | Status: DC

## 2022-07-02 MED ORDER — BUPROPION HCL ER (XL) 150 MG PO TB24
300.0000 mg | ORAL_TABLET | Freq: Every day | ORAL | Status: DC
  Administered 2022-07-04 – 2022-07-09 (×6): 300 mg via ORAL
  Filled 2022-07-02: qty 2
  Filled 2022-07-02: qty 1
  Filled 2022-07-02 (×5): qty 2

## 2022-07-02 MED ORDER — HYDROMORPHONE HCL 1 MG/ML IJ SOLN: 0.5000 mg | INTRAMUSCULAR | Status: DC | PRN

## 2022-07-02 MED ORDER — OXYCODONE HCL 5 MG PO TABS
5.0000 mg | ORAL_TABLET | ORAL | Status: DC | PRN
  Administered 2022-07-02: 10 mg via ORAL
  Administered 2022-07-03: 5 mg via ORAL
  Administered 2022-07-04 – 2022-07-05 (×3): 10 mg via ORAL
  Filled 2022-07-02: qty 2
  Filled 2022-07-02: qty 1
  Filled 2022-07-02 (×3): qty 2

## 2022-07-02 MED ORDER — MELATONIN 5 MG PO TABS
10.0000 mg | ORAL_TABLET | Freq: Every day | ORAL | Status: DC
  Administered 2022-07-02 – 2022-07-08 (×7): 10 mg via ORAL
  Filled 2022-07-02 (×7): qty 2

## 2022-07-02 MED ORDER — LORATADINE 10 MG PO TABS
10.0000 mg | ORAL_TABLET | Freq: Every day | ORAL | Status: DC
  Administered 2022-07-04 – 2022-07-09 (×6): 10 mg via ORAL
  Filled 2022-07-02 (×6): qty 1

## 2022-07-02 MED ORDER — DONEPEZIL HCL 5 MG PO TABS
5.0000 mg | ORAL_TABLET | Freq: Every day | ORAL | Status: DC
  Administered 2022-07-02 – 2022-07-08 (×7): 5 mg via ORAL
  Filled 2022-07-02 (×7): qty 1

## 2022-07-02 MED ORDER — DOCUSATE SODIUM 283 MG RE ENEM: 1.0000 | ENEMA | RECTAL | Status: DC | PRN

## 2022-07-02 MED ORDER — ACETAMINOPHEN 325 MG PO TABS
650.0000 mg | ORAL_TABLET | Freq: Four times a day (QID) | ORAL | Status: DC | PRN
  Administered 2022-07-03 – 2022-07-09 (×2): 650 mg via ORAL
  Filled 2022-07-02: qty 2

## 2022-07-02 MED ORDER — ZOLPIDEM TARTRATE 5 MG PO TABS
5.0000 mg | ORAL_TABLET | Freq: Every evening | ORAL | Status: DC | PRN
  Administered 2022-07-02 – 2022-07-06 (×3): 5 mg via ORAL
  Filled 2022-07-02 (×3): qty 1

## 2022-07-02 MED ORDER — CAMPHOR-MENTHOL 0.5-0.5 % EX LOTN: 1.0000 | TOPICAL_LOTION | Freq: Three times a day (TID) | CUTANEOUS | Status: DC | PRN

## 2022-07-02 MED ORDER — BISACODYL 5 MG PO TBEC: 5.0000 mg | DELAYED_RELEASE_TABLET | Freq: Every day | ORAL | Status: DC | PRN

## 2022-07-02 MED ORDER — DOCUSATE SODIUM 100 MG PO CAPS
100.0000 mg | ORAL_CAPSULE | Freq: Two times a day (BID) | ORAL | Status: DC
  Administered 2022-07-02 – 2022-07-03 (×2): 100 mg via ORAL
  Filled 2022-07-02 (×2): qty 1

## 2022-07-02 MED ORDER — CALCIUM CARBONATE ANTACID 1250 MG/5ML PO SUSP: 500.0000 mg | Freq: Four times a day (QID) | ORAL | Status: DC | PRN

## 2022-07-02 MED ORDER — NEBIVOLOL HCL 10 MG PO TABS
20.0000 mg | ORAL_TABLET | Freq: Every day | ORAL | Status: DC
  Administered 2022-07-02 – 2022-07-08 (×5): 20 mg via ORAL
  Filled 2022-07-02 (×9): qty 2

## 2022-07-02 MED ORDER — NEPRO/CARBSTEADY PO LIQD: 237.0000 mL | Freq: Three times a day (TID) | ORAL | Status: DC | PRN

## 2022-07-02 MED ORDER — CALCITRIOL 0.5 MCG PO CAPS
0.5000 ug | ORAL_CAPSULE | ORAL | Status: DC
  Administered 2022-07-04 – 2022-07-09 (×3): 0.5 ug via ORAL
  Filled 2022-07-02 (×4): qty 1

## 2022-07-02 MED ORDER — POLYETHYLENE GLYCOL 3350 17 G PO PACK: 17.0000 g | PACK | Freq: Every day | ORAL | Status: DC | PRN

## 2022-07-02 MED ORDER — CHLORHEXIDINE GLUCONATE CLOTH 2 % EX PADS
6.0000 | MEDICATED_PAD | Freq: Every day | CUTANEOUS | Status: DC
  Administered 2022-07-03 – 2022-07-08 (×5): 6 via TOPICAL

## 2022-07-02 MED ORDER — METHOCARBAMOL 1000 MG/10ML IJ SOLN: 500.0000 mg | Freq: Four times a day (QID) | INTRAVENOUS | Status: DC | PRN

## 2022-07-02 MED ORDER — ASPIRIN 81 MG PO TBEC
81.0000 mg | DELAYED_RELEASE_TABLET | Freq: Every day | ORAL | Status: DC
  Administered 2022-07-04 – 2022-07-09 (×5): 81 mg via ORAL
  Filled 2022-07-02 (×5): qty 1

## 2022-07-02 MED ORDER — VERAPAMIL HCL 40 MG PO TABS
80.0000 mg | ORAL_TABLET | Freq: Three times a day (TID) | ORAL | Status: DC
  Administered 2022-07-03: 80 mg via ORAL
  Filled 2022-07-02: qty 2
  Filled 2022-07-02: qty 1
  Filled 2022-07-02: qty 2

## 2022-07-02 MED ORDER — POLYETHYLENE GLYCOL 3350 17 G PO PACK
17.0000 g | PACK | Freq: Two times a day (BID) | ORAL | Status: DC
  Administered 2022-07-02 – 2022-07-08 (×9): 17 g via ORAL
  Filled 2022-07-02 (×12): qty 1

## 2022-07-02 MED ORDER — ONDANSETRON HCL 4 MG PO TABS: 4.0000 mg | ORAL_TABLET | Freq: Four times a day (QID) | ORAL | Status: DC | PRN

## 2022-07-02 MED ORDER — PANTOPRAZOLE SODIUM 40 MG PO TBEC
40.0000 mg | DELAYED_RELEASE_TABLET | Freq: Every day | ORAL | Status: DC
  Administered 2022-07-03 – 2022-07-09 (×7): 40 mg via ORAL
  Filled 2022-07-02 (×7): qty 1

## 2022-07-02 MED ORDER — SORBITOL 70 % SOLN: 30.0000 mL | Status: DC | PRN

## 2022-07-02 MED ORDER — VENLAFAXINE HCL ER 75 MG PO CP24
75.0000 mg | ORAL_CAPSULE | Freq: Every day | ORAL | Status: DC
  Administered 2022-07-04 – 2022-07-09 (×6): 75 mg via ORAL
  Filled 2022-07-02 (×6): qty 1

## 2022-07-02 NOTE — ED Triage Notes (Signed)
Walked a few steps from car at Dialysis Center when pt fell backwards. +Hit his head. Denies LOC. Pt reports right side pain from lower back to leg. Unable to bear weight to RLE per EMS. Alert and oriented x 4. Did not get dialysis today.

## 2022-07-02 NOTE — Consult Note (Signed)
[facility] Renal Consultation Note    Indication for Consultation:  Management of ESRD/hemodialysis; anemia, hypertension/volume and secondary hyperparathyroidism   HPI: Jacob [name] is a 77 y.o. male with ESRD on HD, HTN, HLD, gout, hx prostate cancer s/p radiation. Dialysis at [facility] MWF. He had a fall shortly after arriving for his usual dialysis today. Walked a few steps with his walker and fell backwards. Sent to ED via EMS. No acute findings on head CT but hip xray revealed mildly displaced intertrochanteric right hip fracture. Ortho has seen and planning for surgery in the  am.   Seen in the ED. Alert, oriented. States hip only hurts when he tries to move, a little uncomfortable in the stretcher. Otherwise no particular complaints. Last dialysis Monday, completed full treatment. Has been using RUE graft.   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, has had transfusion   Anxiety and depression    Arthritis    gout   Back pain    BPH with obstruction/lower urinary tract symptoms    Chronic kidney disease    Stage 5 kidney   Constipation    ED (erectile dysfunction)    GERD (gastroesophageal reflux disease)    Hypercholesterolemia    Hypertension    Night sweats    Post-operative nausea and vomiting 04/09/2016   Prostate cancer (HCC) 08/14/2011   Adenocarcinoma,gleason:3+3=6,& 3+4=7,PSA=5.66, s/p radiation therapy   PUD (peptic ulcer disease)    Sleep apnea    does not use Cpap but occasionally uses O2 @ 2L qhs   Stomach cancer (HCC)    Ulcer    peptic ulcer hx   Past Surgical History:  Procedure Laterality Date   AV FISTULA PLACEMENT Right 02/19/2021   Procedure: ARTERIOVENOUS (AV) FISTULA CREATION;  Surgeon: Victorino Sparrow, MD;  Location: [facility] OR;  Service: Vascular;  Laterality: Right;   AV FISTULA PLACEMENT Right 04/21/2022   Procedure: INSERTION OF RIGHT ARM ARTERIOVENOUS (AV) GORE-TEX  GRAFT;  Surgeon: Victorino Sparrow, MD;  Location: [facility] OR;  Service: Vascular;  Laterality: Right;   BASCILIC VEIN TRANSPOSITION Right 07/23/2021   Procedure: RIGHT ARM BASILIC VEIN TRANSPOSITION;  Surgeon: Victorino Sparrow, MD;  Location: [facility] OR;  Service: Vascular;  Laterality: Right;  PERIPHERAL NERVE BLOCK   colon polyps bx  11/24/06   colon,transverse and rectosigmoid polyps:tubular adenomas and hyperplastic polyps,no high grade dysplasia or malignancy    COLONOSCOPY WITH PROPOFOL N/A 08/24/2019   Procedure: COLONOSCOPY WITH PROPOFOL;  Surgeon: Shellia Cleverly, DO;  Location: [facility];  Service: Gastroenterology;  Laterality: N/A;   duodenal bx  11/24/06   benign   ESOPHAGOGASTRODUODENOSCOPY N/A 10/27/2015   Procedure: ESOPHAGOGASTRODUODENOSCOPY (EGD);  Surgeon: Iva Boop, MD;  Location: [facility];  Service: Endoscopy;  Laterality: N/A;   EUS N/A 11/08/2015   Procedure: UPPER ENDOSCOPIC ULTRASOUND (EUS) RADIAL;  Surgeon: Rachael Fee, MD;  Location: [facility];  Service: Endoscopy;  Laterality: N/A;   GASTRECTOMY N/A 03/25/2016   Procedure: DISTAL GASTRECTOMY;  Surgeon: Almond Lint, MD;  Location: [facility] OR;  Service: General;  Laterality: N/A;   gastric bx  11/24/06   chronic active gastritis,with metaplasia and focal changaes of xanthelasma   GASTROSTOMY N/A 03/25/2016   Procedure: INSERTION OF FEEDING TUBE;  Surgeon: Almond Lint, MD;  Location: [facility] OR;  Service: General;  Laterality: N/A;   INSERTION OF DIALYSIS CATHETER Left 04/21/2022   Procedure: INSERTION OF TUNNELED  DIALYSIS CATHETER, LEFT INTERNAL JUGULAR;  Surgeon: Victorino Sparrow, MD;  Location: [facility] OR;  Service: Vascular;  Laterality: Left;   INSERTION PROSTATE RADIATION SEED  12-29-11   IR GENERIC HISTORICAL  04/01/2016   IR GJ TUBE CHANGE 04/01/2016 Richarda Overlie, MD [facility]   IR GENERIC HISTORICAL  04/05/2016   IR GJ TUBE CHANGE 04/05/2016 Richarda Overlie, MD [facility]   LAPAROSCOPIC GASTRECTOMY  03/25/2016    Diagnostic laparoscopy, distal gastrectomy with Billroth 2 reconstruction and gastrojejunostomy tube   LAPAROSCOPY N/A 03/25/2016   Procedure: DIAGNOSTIC LAPAROSCOPY;  Surgeon: Almond Lint, MD;  Location: [facility] OR;  Service: General;  Laterality: N/A;   POLYPECTOMY  08/24/2019   Procedure: POLYPECTOMY;  Surgeon: Shellia Cleverly, DO;  Location: [facility];  Service: Gastroenterology;;   PORTACATH PLACEMENT Left 12/04/2015   Procedure: INSERTION PORT-A-CATH;  Surgeon: Almond Lint, MD;  Location: [facility] OR;  Service: General;  Laterality: Left;   PROSTATE BIOPSY  08/14/11   Adenocarcinoma/volume=58.51cc,gleason=3+3=6 & 3+4=7   TONSILLECTOMY     77 years old   Family History  Problem Relation Age of Onset   Cancer Mother        NOS   Alcohol abuse Father    Alcohol abuse Brother 48   Cancer Paternal Aunt        NOS   Colon cancer Neg Hx    Social History:  reports that he quit smoking about 22 years ago. His smoking use included cigarettes. He has a 45.00 pack-year smoking history. He has been exposed to tobacco smoke. He has never used smokeless tobacco. He reports that he does not currently use alcohol after a past usage of about 1.0 standard drink of alcohol per week. He reports that he does not use drugs. Allergies  Allergen Reactions   Lisinopril Cough   Prior to Admission medications   Medication Sig Start Date End Date Taking? Authorizing Provider  acetaminophen (TYLENOL) 500 MG tablet Take 1,000 mg by mouth daily as needed for moderate pain or headache.    [provider]  allopurinol (ZYLOPRIM) 300 MG tablet Take 150 mg by mouth daily. 04/15/19   [provider]  aspirin EC 81 MG tablet Take 1 tablet (81 mg total) by mouth daily. Swallow whole. 01/10/21   Tobb, Kardie, DO  azithromycin (ZITHROMAX) 250 MG tablet Take 1 tablet (250 mg total) by mouth daily. Take first 2 tablets together, then 1 every day until finished. Patient not taking: Reported on 05/23/2022  02/22/22   Roxy Horseman, PA-C  buPROPion (WELLBUTRIN XL) 300 MG 24 hr tablet Take 300 mg by mouth daily.  05/10/15   [provider]  calcium carbonate (TUMS - DOSED IN MG ELEMENTAL CALCIUM) 500 MG chewable tablet Chew 3 tablets by mouth 2 (two) times daily as needed for indigestion or heartburn.    [provider]  cetirizine (ZYRTEC) 10 MG tablet Take 10 mg by mouth daily.    [provider]  Cholecalciferol (VITAMIN D3) 10 MCG (400 UNIT) CAPS Take 1 capsule (400 Units total) by mouth daily. 04/21/22   Schuh, McKenzi P, PA-C  diclofenac Sodium (VOLTAREN) 1 % GEL Apply 2 g topically 4 (four) times daily. 11/06/20   White, Elita Boone, NP  donepezil (ARICEPT) 5 MG tablet Take 1 tablet (5 mg total) by mouth at bedtime. 08/13/20   Dohmeier, Porfirio Mylar, MD  ferrous sulfate 325 (65 FE) MG tablet Take 325 mg by mouth daily with breakfast. Patient not taking: Reported on 05/23/2022  [provider]  furosemide (LASIX) 40 MG tablet Take 40 mg by mouth daily.    [provider]  hydrALAZINE (APRESOLINE) 50 MG tablet Take 50 mg by mouth 3 (three) times daily. Patient not taking: Reported on 05/23/2022 02/05/21   [provider]  lidocaine (XYLOCAINE) 5 % ointment Apply 1 application topically as needed. 11/06/20   Valinda Hoar, NP  Melatonin 10 MG TABS Take 10 mg by mouth at bedtime.    [provider]  Nebivolol HCl 20 MG TABS Take 20 mg by mouth at bedtime.    [provider]  oxyCODONE-acetaminophen (PERCOCET) 5-325 MG tablet Take 1 tablet by mouth every 6 (six) hours as needed for severe pain. Patient not taking: Reported on 05/23/2022 04/21/22 04/21/23  Loel Dubonnet P, PA-C  pantoprazole (PROTONIX) 40 MG tablet Take 1 tablet (40 mg total) by mouth daily before supper. 09/18/18   Darlin Drop, DO  polyethylene glycol powder (GLYCOLAX/MIRALAX) 17 GM/SCOOP powder Take 17 g by mouth 2 (two) times daily. Until daily soft stools OTC 02/22/22    Roxy Horseman, PA-C  rosuvastatin (CRESTOR) 5 MG tablet Take 5 mg by mouth daily. 02/05/21   [provider]  sodium bicarbonate 650 MG tablet Take 650 mg by mouth 2 (two) times daily. Patient not taking: Reported on 05/23/2022    [provider]  sodium zirconium cyclosilicate (LOKELMA) 10 g PACK packet Take 10 g by mouth every other day. Patient not taking: Reported on 05/23/2022    [provider]  tamsulosin (FLOMAX) 0.4 MG CAPS capsule Take 1 capsule by mouth every day 06/21/22   Malen Gauze, MD  venlafaxine XR (EFFEXOR-XR) 75 MG 24 hr capsule Take 75 mg by mouth daily. 07/05/21   [provider]  verapamil (CALAN) 80 MG tablet Take 80 mg by mouth 3 (three) times daily.     [provider]   Current Facility-Administered Medications  Medication Dose Route Frequency Provider Last Rate Last Admin   [START ON 07/03/2022] Chlorhexidine Gluconate Cloth 2 % PADS 6 each  6 each Topical Q0600 Tomasa Blase, PA-C       Current Outpatient Medications  Medication Sig Dispense Refill   acetaminophen (TYLENOL) 500 MG tablet Take 1,000 mg by mouth daily as needed for moderate pain or headache.     allopurinol (ZYLOPRIM) 300 MG tablet Take 150 mg by mouth daily.     aspirin EC 81 MG tablet Take 1 tablet (81 mg total) by mouth daily. Swallow whole. 90 tablet 3   azithromycin (ZITHROMAX) 250 MG tablet Take 1 tablet (250 mg total) by mouth daily. Take first 2 tablets together, then 1 every day until finished. (Patient not taking: Reported on 05/23/2022) 6 tablet 0   buPROPion (WELLBUTRIN XL) 300 MG 24 hr tablet Take 300 mg by mouth daily.      calcium carbonate (TUMS - DOSED IN MG ELEMENTAL CALCIUM) 500 MG chewable tablet Chew 3 tablets by mouth 2 (two) times daily as needed for indigestion or heartburn.     cetirizine (ZYRTEC) 10 MG tablet Take 10 mg by mouth daily.     Cholecalciferol (VITAMIN D3) 10 MCG (400 UNIT) CAPS Take 1 capsule (400 Units  total) by mouth daily. 30 capsule 0   diclofenac Sodium (VOLTAREN) 1 % GEL Apply 2 g topically 4 (four) times daily. 100 g 1   donepezil (ARICEPT) 5 MG tablet Take 1 tablet (5 mg total) by mouth at bedtime. 30 tablet  6   ferrous sulfate 325 (65 FE) MG tablet Take 325 mg by mouth daily with breakfast. (Patient not taking: Reported on 05/23/2022)     furosemide (LASIX) 40 MG tablet Take 40 mg by mouth daily.     hydrALAZINE (APRESOLINE) 50 MG tablet Take 50 mg by mouth 3 (three) times daily. (Patient not taking: Reported on 05/23/2022)     lidocaine (XYLOCAINE) 5 % ointment Apply 1 application topically as needed. 35.44 g 0   Melatonin 10 MG TABS Take 10 mg by mouth at bedtime.     Nebivolol HCl 20 MG TABS Take 20 mg by mouth at bedtime.     oxyCODONE-acetaminophen (PERCOCET) 5-325 MG tablet Take 1 tablet by mouth every 6 (six) hours as needed for severe pain. (Patient not taking: Reported on 05/23/2022) 20 tablet 0   pantoprazole (PROTONIX) 40 MG tablet Take 1 tablet (40 mg total) by mouth daily before supper. 30 tablet 0   polyethylene glycol powder (GLYCOLAX/MIRALAX) 17 GM/SCOOP powder Take 17 g by mouth 2 (two) times daily. Until daily soft stools OTC 225 g 0   rosuvastatin (CRESTOR) 5 MG tablet Take 5 mg by mouth daily.     sodium bicarbonate 650 MG tablet Take 650 mg by mouth 2 (two) times daily. (Patient not taking: Reported on 05/23/2022)     sodium zirconium cyclosilicate (LOKELMA) 10 g PACK packet Take 10 g by mouth every other day. (Patient not taking: Reported on 05/23/2022)     tamsulosin (FLOMAX) 0.4 MG CAPS capsule Take 1 capsule by mouth every day 30 capsule 11   venlafaxine XR (EFFEXOR-XR) 75 MG 24 hr capsule Take 75 mg by mouth daily.     verapamil (CALAN) 80 MG tablet Take 80 mg by mouth 3 (three) times daily.        ROS: As per HPI otherwise negative.  Physical Exam: Vitals:   07/02/22 1145  BP: 108/70  Pulse: 77  Resp: 16  Temp: 98.6 F (37 C)  TempSrc: Oral  SpO2: 95%      General: Appears comfortable, in no distress  Head: NCAT sclera not icteric MMM Neck: Supple. No masses  Lungs: Clear bilaterally  Heart: RRR, no murmur, rub, or gallop  Abdomen: soft non-tender  Lower extremities: No LE edema  Neuro: A & O X 3. Follows commands  Psych:  Normal affect. Dialysis Access: RUE AVG+ bruit   Labs: Basic Metabolic Panel: Recent Labs  Lab 07/02/22 1339 07/02/22 1352  NA 137 135  K 4.0 3.9  CL 96* 100  CO2 28  --   GLUCOSE 96 95  BUN 36* 34*  CREATININE 6.41* 7.10*  CALCIUM 8.8*  --    Liver Function Tests: Recent Labs  Lab 07/02/22 1339  AST 13*  ALT 8  ALKPHOS 46  BILITOT 0.5  PROT 6.1*  ALBUMIN 2.8*   No results for input(s): "LIPASE", "AMYLASE" in the last 168 hours. No results for input(s): "AMMONIA" in the last 168 hours. CBC: Recent Labs  Lab 07/02/22 1339 07/02/22 1352  WBC 8.1  --   NEUTROABS 6.2  --   HGB 9.2* 9.5*  HCT 28.5* 28.0*  MCV 100.0  --   PLT 187  --    Cardiac Enzymes: No results for input(s): "CKTOTAL", "CKMB", "CKMBINDEX", "TROPONINI" in the last 168 hours. CBG: No results for input(s): "GLUCAP" in the last 168 hours. Iron Studies: No results for input(s): "IRON", "TIBC", "TRANSFERRIN", "FERRITIN" in the last 72 hours. Studies/Results: DG Chest  1 View  Result Date: 07/02/2022 CLINICAL DATA:  Pain after fall.  History of gastric cancer EXAM: CHEST  1 VIEW COMPARISON:  X-ray 04/21/2022 FINDINGS: Left upper chest port with tip along the central SVC. No consolidation, pneumothorax or effusion. No edema. Normal cardiopericardial silhouette. Film is under penetrated. IMPRESSION: Chest port.  No acute cardiopulmonary disease. Electronically Signed   By: Karen Kays M.D.   On: 07/02/2022 14:07   DG Knee Complete 4 Views Right  Result Date: 07/02/2022 CLINICAL DATA:  Pain after fall EXAM: RIGHT KNEE - COMPLETE 4 VIEW COMPARISON:  None Available. FINDINGS: Osteopenia. Mild joint space loss of the medial  compartment. No fracture or dislocation. Preserved bone mineralization. No joint effusion on lateral view. Chondrocalcinosis of the medial compartment. IMPRESSION: Mild degenerative changes of the medial compartment with chondrocalcinosis. Electronically Signed   By: Karen Kays M.D.   On: 07/02/2022 14:05   DG Hip Unilat W or Wo Pelvis 2-3 Views Right  Result Date: 07/02/2022 CLINICAL DATA:  Pain after fall EXAM: DG HIP (WITH OR WITHOUT PELVIS) 3V RIGHT COMPARISON:  None Available. FINDINGS: Mildly displaced oblique fracture involving the intertrochanteric region of the right hip. Osteopenia. No additional fracture or dislocation. Preserved joint spaces. Degenerative changes seen of the lumbar spine at the edge of the imaging field. IMPRESSION: Mildly displaced intertrochanteric right hip fracture Electronically Signed   By: Karen Kays M.D.   On: 07/02/2022 14:05   CT Head Wo Contrast  Result Date: 07/02/2022 CLINICAL DATA:  Head trauma, minor (Age >= 65y); Neck trauma (Age >= 65y) EXAM: CT HEAD WITHOUT CONTRAST CT CERVICAL SPINE WITHOUT CONTRAST TECHNIQUE: Multidetector CT imaging of the head and cervical spine was performed following the standard protocol without intravenous contrast. Multiplanar CT image reconstructions of the cervical spine were also generated. RADIATION DOSE REDUCTION: This exam was performed according to the departmental dose-optimization program which includes automated exposure control, adjustment of the mA and/or kV according to patient size and/or use of iterative reconstruction technique. COMPARISON:  CT Head 02/16/21 FINDINGS: CT HEAD FINDINGS Brain: No evidence of acute infarction, hemorrhage, hydrocephalus, extra-axial collection or mass lesion/mass effect. Mineralization of the basal ganglia bilaterally. There is a chronic appearing infarct in the centrum semiovale on the right Vascular: No hyperdense vessel or unexpected calcification. Skull: Normal. Negative for fracture  or focal lesion. Sinuses/Orbits: No middle ear or mastoid effusion. Paranasal sinuses are notable for mucosal thickening in the left maxillary sinus and bilateral sphenoid sinuses. Orbits are unremarkable. Other: None. CT CERVICAL SPINE FINDINGS Alignment: Normal. Skull base and vertebrae: No acute fracture. No primary bone lesion or focal pathologic process. Soft tissues and spinal canal: No prevertebral fluid or swelling. No visible canal hematoma. Disc levels:  No evidence of high-grade spinal canal stenosis. Upper chest: Incompletely imaged. Other: No IMPRESSION: 1. No acute intracranial abnormality. 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 2. No acute fracture or traumatic malalignment of the cervical spine. Electronically Signed   By: Lorenza Cambridge M.D.   On: 07/02/2022 13:55   CT Cervical Spine Wo Contrast  Result Date: 07/02/2022 CLINICAL DATA:  Head trauma, minor (Age >= 65y); Neck trauma (Age >= 65y) EXAM: CT HEAD WITHOUT CONTRAST CT CERVICAL SPINE WITHOUT CONTRAST TECHNIQUE: Multidetector CT imaging of the head and cervical spine was performed following the standard protocol without intravenous contrast. Multiplanar CT image reconstructions of the cervical spine were also generated. RADIATION DOSE REDUCTION: This exam was performed according to the departmental dose-optimization program which  includes automated exposure control, adjustment of the mA and/or kV according to patient size and/or use of iterative reconstruction technique. COMPARISON:  CT Head 02/16/21 FINDINGS: CT HEAD FINDINGS Brain: No evidence of acute infarction, hemorrhage, hydrocephalus, extra-axial collection or mass lesion/mass effect. Mineralization of the basal ganglia bilaterally. There is a chronic appearing infarct in the centrum semiovale on the right Vascular: No hyperdense vessel or unexpected calcification. Skull: Normal. Negative for fracture or focal lesion. Sinuses/Orbits: No middle ear or mastoid  effusion. Paranasal sinuses are notable for mucosal thickening in the left maxillary sinus and bilateral sphenoid sinuses. Orbits are unremarkable. Other: None. CT CERVICAL SPINE FINDINGS Alignment: Normal. Skull base and vertebrae: No acute fracture. No primary bone lesion or focal pathologic process. Soft tissues and spinal canal: No prevertebral fluid or swelling. No visible canal hematoma. Disc levels:  No evidence of high-grade spinal canal stenosis. Upper chest: Incompletely imaged. Other: No IMPRESSION: 1. No acute intracranial abnormality. 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 2. No acute fracture or traumatic malalignment of the cervical spine. Electronically Signed   By: Lorenza Cambridge M.D.   On: 07/02/2022 13:55    Dialysis Orders:  [facility] MWF 4:00  400/A1.5x EDW 86kg 2K/2Ca AVG  Heparin 1500 Mircera 75 q 2 wks -last 6/12 Calcitriol 0.5 TIW, Sensipar 30 TIW  Assessment/Plan:  R hip fracture d/t mechanical fall - Ortho consulted. For surgical repair tomorrow   ESRD -  HD MWF. Continue on schedule. Plan for HD tonight if staffing permits  Hypertension/volume  - BP on low side. Volume ok. Minimal UF.   Anemia  - Hgb 9.2 on ESA. Follow trends   Metabolic bone disease -  Ca acceptable. Continue binders/calcitriol/Sensipar when eating   Nutrition - Renal diet/Prot supp when eating.   Tomasa Blase PA-C [facility] 07/02/2022, 3:14 PM

## 2022-07-02 NOTE — ED Provider Notes (Signed)
[facility]ENC[facility]MENT AT [facility] Provider Note   CSN: [id number] Arrival date & time: 07/02/22  1142     History  Chief Complaint  Patient presents with   Marletta Lor    Jacob [name] is a 77 y.o. male.  Patient presents to the emergency department via EMS complaining of right leg pain secondary to a fall.  Patient states he was going to dialysis and he lost his balance, falling backwards and hitting his head on the ground.  He reports right-sided pain from the lower back down the lower right leg and is unable to bear weight on the right lower extremity.  He did not get dialysis today.  He is alert and oriented at this time.  He denies losing consciousness, denies blood thinner usage.  Past medical history significant for end-stage renal disease on hemodialysis, prostate cancer, GERD, stomach cancer  HPI     Home Medications Prior to Admission medications   Medication Sig Start Date End Date Taking? Authorizing Provider  acetaminophen (TYLENOL) 500 MG tablet Take 1,000 mg by mouth daily as needed for moderate pain or headache.    [provider]  allopurinol (ZYLOPRIM) 300 MG tablet Take 150 mg by mouth daily. 04/15/19   [provider]  aspirin EC 81 MG tablet Take 1 tablet (81 mg total) by mouth daily. Swallow whole. 01/10/21   Tobb, Kardie, DO  azithromycin (ZITHROMAX) 250 MG tablet Take 1 tablet (250 mg total) by mouth daily. Take first 2 tablets together, then 1 every day until finished. Patient not taking: Reported on 05/23/2022 02/22/22   Roxy Horseman, PA-C  buPROPion (WELLBUTRIN XL) 300 MG 24 hr tablet Take 300 mg by mouth daily.  05/10/15   [provider]  calcium carbonate (TUMS - DOSED IN MG ELEMENTAL CALCIUM) 500 MG chewable tablet Chew 3 tablets by mouth 2 (two) times daily as needed for indigestion or heartburn.    [provider]  cetirizine (ZYRTEC) 10 MG tablet Take 10 mg by mouth daily.    [provider]  Cholecalciferol (VITAMIN D3) 10 MCG (400 UNIT) CAPS Take 1 capsule (400 Units total) by mouth daily. 04/21/22   Schuh, McKenzi P, PA-C  diclofenac Sodium (VOLTAREN) 1 % GEL Apply 2 g topically 4 (four) times daily. 11/06/20   White, Elita Boone, NP  donepezil (ARICEPT) 5 MG tablet Take 1 tablet (5 mg total) by mouth at bedtime. 08/13/20   Dohmeier, Porfirio Mylar, MD  ferrous sulfate 325 (65 FE) MG tablet Take 325 mg by mouth daily with breakfast. Patient not taking: Reported on 05/23/2022    [provider]  furosemide (LASIX) 40 MG tablet Take 40 mg by mouth daily.    [provider]  hydrALAZINE (APRESOLINE) 50 MG tablet Take 50 mg by mouth 3 (three) times daily. Patient not taking: Reported on 05/23/2022 02/05/21   [provider]  lidocaine (XYLOCAINE) 5 % ointment Apply 1 application topically as needed. 11/06/20   Valinda Hoar, NP  Melatonin 10 MG TABS Take 10 mg by mouth at bedtime.    [provider]  Nebivolol HCl 20 MG TABS Take 20 mg by mouth at bedtime.    [provider]  oxyCODONE-acetaminophen (PERCOCET) 5-325 MG tablet Take 1 tablet by mouth every 6 (six) hours as needed for severe pain. Patient not taking: Reported on 05/23/2022 04/21/22 04/21/23  Loel Dubonnet P, PA-C  pantoprazole (PROTONIX) 40 MG tablet Take 1 tablet (40 mg total) by mouth  daily before supper. 09/18/18   Darlin Drop, DO  polyethylene glycol powder (GLYCOLAX/MIRALAX) 17 GM/SCOOP powder Take 17 g by mouth 2 (two) times daily. Until daily soft stools OTC 02/22/22   Roxy Horseman, PA-C  rosuvastatin (CRESTOR) 5 MG tablet Take 5 mg by mouth daily. 02/05/21   [provider]  sodium bicarbonate 650 MG tablet Take 650 mg by mouth 2 (two) times daily. Patient not taking: Reported on 05/23/2022    [provider]  sodium zirconium cyclosilicate (LOKELMA) 10 g PACK packet Take 10 g by mouth every other day. Patient not taking: Reported on 05/23/2022     [provider]  tamsulosin (FLOMAX) 0.4 MG CAPS capsule Take 1 capsule by mouth every day 06/21/22   Malen Gauze, MD  venlafaxine XR (EFFEXOR-XR) 75 MG 24 hr capsule Take 75 mg by mouth daily. 07/05/21   [provider]  verapamil (CALAN) 80 MG tablet Take 80 mg by mouth 3 (three) times daily.     [provider]      Allergies    Lisinopril    Review of Systems   Review of Systems  Physical Exam Updated Vital Signs BP 108/70 (BP Location: Left Arm)   Pulse 77   Temp 98.6 F (37 C) (Oral)   Resp 16   SpO2 95%  Physical Exam Vitals and nursing note reviewed.  Constitutional:      General: He is not in acute distress.    Appearance: He is well-developed.  HENT:     Head: Normocephalic.     Comments: Mild swelling noted to posterior right scalp with no bruising, laceration, deformity Eyes:     Conjunctiva/sclera: Conjunctivae normal.  Cardiovascular:     Rate and Rhythm: Normal rate and regular rhythm.     Heart sounds: No murmur heard. Pulmonary:     Effort: Pulmonary effort is normal. No respiratory distress.     Breath sounds: Normal breath sounds.  Abdominal:     Palpations: Abdomen is soft.     Tenderness: There is no abdominal tenderness.  Musculoskeletal:        General: No swelling.     Cervical back: Neck supple.     Comments: Right lower extremity externally rotated, minimally shortened.  Pain with passive range of motion of the right hip.  Patient neurovascularly intact distally.  Skin:    General: Skin is warm and dry.     Capillary Refill: Capillary refill takes less than 2 seconds.  Neurological:     Mental Status: He is alert.  Psychiatric:        Mood and Affect: Mood normal.     ED Results / Procedures / Treatments   Labs (all labs ordered are listed, but only abnormal results are displayed) Labs Reviewed  CBC WITH DIFFERENTIAL/PLATELET - Abnormal; Notable for the following components:      Result Value   RBC  2.85 (*)    Hemoglobin 9.2 (*)    HCT 28.5 (*)    All other components within normal limits  COMPREHENSIVE METABOLIC PANEL - Abnormal; Notable for the following components:   Chloride 96 (*)    BUN 36 (*)    Creatinine, Ser 6.41 (*)    Calcium 8.8 (*)    Total Protein 6.1 (*)    Albumin 2.8 (*)    AST 13 (*)    GFR, Estimated 8 (*)    All other components within normal limits  I-STAT CHEM 8, ED -  Abnormal; Notable for the following components:   BUN 34 (*)    Creatinine, Ser 7.10 (*)    Calcium, Ion 0.88 (*)    Hemoglobin 9.5 (*)    HCT 28.0 (*)    All other components within normal limits    EKG None  Radiology DG Chest 1 View  Result Date: 07/02/2022 CLINICAL DATA:  Pain after fall.  History of gastric cancer EXAM: CHEST  1 VIEW COMPARISON:  X-ray 04/21/2022 FINDINGS: Left upper chest port with tip along the central SVC. No consolidation, pneumothorax or effusion. No edema. Normal cardiopericardial silhouette. Film is under penetrated. IMPRESSION: Chest port.  No acute cardiopulmonary disease. Electronically Signed   By: Karen Kays M.D.   On: 07/02/2022 14:07   DG Knee Complete 4 Views Right  Result Date: 07/02/2022 CLINICAL DATA:  Pain after fall EXAM: RIGHT KNEE - COMPLETE 4 VIEW COMPARISON:  None Available. FINDINGS: Osteopenia. Mild joint space loss of the medial compartment. No fracture or dislocation. Preserved bone mineralization. No joint effusion on lateral view. Chondrocalcinosis of the medial compartment. IMPRESSION: Mild degenerative changes of the medial compartment with chondrocalcinosis. Electronically Signed   By: Karen Kays M.D.   On: 07/02/2022 14:05   DG Hip Unilat W or Wo Pelvis 2-3 Views Right  Result Date: 07/02/2022 CLINICAL DATA:  Pain after fall EXAM: DG HIP (WITH OR WITHOUT PELVIS) 3V RIGHT COMPARISON:  None Available. FINDINGS: Mildly displaced oblique fracture involving the intertrochanteric region of the right hip. Osteopenia. No additional  fracture or dislocation. Preserved joint spaces. Degenerative changes seen of the lumbar spine at the edge of the imaging field. IMPRESSION: Mildly displaced intertrochanteric right hip fracture Electronically Signed   By: Karen Kays M.D.   On: 07/02/2022 14:05   CT Head Wo Contrast  Result Date: 07/02/2022 CLINICAL DATA:  Head trauma, minor (Age >= 65y); Neck trauma (Age >= 65y) EXAM: CT HEAD WITHOUT CONTRAST CT CERVICAL SPINE WITHOUT CONTRAST TECHNIQUE: Multidetector CT imaging of the head and cervical spine was performed following the standard protocol without intravenous contrast. Multiplanar CT image reconstructions of the cervical spine were also generated. RADIATION DOSE REDUCTION: This exam was performed according to the departmental dose-optimization program which includes automated exposure control, adjustment of the mA and/or kV according to patient size and/or use of iterative reconstruction technique. COMPARISON:  CT Head 02/16/21 FINDINGS: CT HEAD FINDINGS Brain: No evidence of acute infarction, hemorrhage, hydrocephalus, extra-axial collection or mass lesion/mass effect. Mineralization of the basal ganglia bilaterally. There is a chronic appearing infarct in the centrum semiovale on the right Vascular: No hyperdense vessel or unexpected calcification. Skull: Normal. Negative for fracture or focal lesion. Sinuses/Orbits: No middle ear or mastoid effusion. Paranasal sinuses are notable for mucosal thickening in the left maxillary sinus and bilateral sphenoid sinuses. Orbits are unremarkable. Other: None. CT CERVICAL SPINE FINDINGS Alignment: Normal. Skull base and vertebrae: No acute fracture. No primary bone lesion or focal pathologic process. Soft tissues and spinal canal: No prevertebral fluid or swelling. No visible canal hematoma. Disc levels:  No evidence of high-grade spinal canal stenosis. Upper chest: Incompletely imaged. Other: No IMPRESSION: 1. No acute intracranial abnormality. 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 2. No acute fracture or traumatic malalignment of the cervical spine. Electronically Signed   By: Lorenza Cambridge M.D.   On: 07/02/2022 13:55   CT Cervical Spine Wo Contrast  Result Date: 07/02/2022 CLINICAL DATA:  Head trauma, minor (Age >= 65y); Neck trauma (Age >=  65y) EXAM: CT HEAD WITHOUT CONTRAST CT CERVICAL SPINE WITHOUT CONTRAST TECHNIQUE: Multidetector CT imaging of the head and cervical spine was performed following the standard protocol without intravenous contrast. Multiplanar CT image reconstructions of the cervical spine were also generated. RADIATION DOSE REDUCTION: This exam was performed according to the departmental dose-optimization program which includes automated exposure control, adjustment of the mA and/or kV according to patient size and/or use of iterative reconstruction technique. COMPARISON:  CT Head 02/16/21 FINDINGS: CT HEAD FINDINGS Brain: No evidence of acute infarction, hemorrhage, hydrocephalus, extra-axial collection or mass lesion/mass effect. Mineralization of the basal ganglia bilaterally. There is a chronic appearing infarct in the centrum semiovale on the right Vascular: No hyperdense vessel or unexpected calcification. Skull: Normal. Negative for fracture or focal lesion. Sinuses/Orbits: No middle ear or mastoid effusion. Paranasal sinuses are notable for mucosal thickening in the left maxillary sinus and bilateral sphenoid sinuses. Orbits are unremarkable. Other: None. CT CERVICAL SPINE FINDINGS Alignment: Normal. Skull base and vertebrae: No acute fracture. No primary bone lesion or focal pathologic process. Soft tissues and spinal canal: No prevertebral fluid or swelling. No visible canal hematoma. Disc levels:  No evidence of high-grade spinal canal stenosis. Upper chest: Incompletely imaged. Other: No IMPRESSION: 1. No acute intracranial abnormality. 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 2. No acute  fracture or traumatic malalignment of the cervical spine. Electronically Signed   By: Lorenza Cambridge M.D.   On: 07/02/2022 13:55    Procedures Procedures    Medications Ordered in ED Medications  morphine (PF) 4 MG/ML injection 4 mg (4 mg Intravenous Given 07/02/22 1337)  ondansetron (ZOFRAN) injection 4 mg (4 mg Intravenous Given 07/02/22 1336)    ED Course/ Medical Decision Making/ A&P                             Medical Decision Making Amount and/or Complexity of Data Reviewed Labs: ordered. Radiology: ordered.  Risk Prescription drug management.   This patient presents to the ED for concern of injury secondary to a fall, this involves an extensive number of treatment options, and is a complaint that carries with it a high risk of complications and morbidity.  The differential diagnosis includes intracranial abnormalities, fracture, dislocation, soft tissue injury, others   Co morbidities that complicate the patient evaluation  End-stage renal disease   Additional history obtained:  Additional history obtained from patient's son and EMS External records from outside source obtained and reviewed including vascular surgery notes from recent left internal jugular tunneled dialysis catheter placement   Lab Tests:  I Ordered, and personally interpreted labs.  The pertinent results include: Hemoglobin 9.2 appears close to patient's baseline   Imaging Studies ordered:  I ordered imaging studies including CT scans of the head and neck, plain films of chest, right knee, right hip I independently visualized and interpreted imaging which showed Mildly displaced intertrochanteric right hip fracture.  1. No acute intracranial abnormality. Chronic appearing infarct in  the right centrum semiovale.  2. No acute fracture or traumatic malalignment of the cervical  spine.   I agree with the radiologist interpretation   Consultations Obtained:  I requested consultation with the  orthopedic surgery team, Earney Hamburg, PA-C,  and discussed lab and imaging findings as well as pertinent plan - they recommend: Admission to medicine for likely surgical intervention  Requested consultation with the hospitalist.  Dr. Kevan Ny agrees to see patient for admission.  Request  that I contact nephrology to arrange hemodialysis  I requested consultation with nephrology who agreed to add the patient to the hemodialysis schedule.    Problem List / ED Course / Critical interventions / Medication management   I ordered medication including Morphine for pain, Zofran for nausea Reevaluation of the patient after these medicines showed that the patient improved I have reviewed the patients home medicines and have made adjustments as needed    Test / Admission - Considered:  Patient with a right-sided intertrochanteric fracture.  Patient will need admission for surgical intervention.  Patient will also need hemodialysis during the hospitalization due to missing his treatment earlier today.         Final Clinical Impression(s) / ED Diagnoses Final diagnoses:  Closed displaced intertrochanteric fracture of right femur, initial encounter (HCC)  Fall, initial encounter  ESRD (end stage renal disease) [facility])    Rx / DC Orders ED Discharge Orders     None         Pamala Duffel 07/02/22 1458    Alvira Monday, MD 07/02/22 2325

## 2022-07-02 NOTE — Plan of Care (Signed)

## 2022-07-02 NOTE — H&P (Signed)
History and Physical    Patient: Jacob [name] UJW:[id number] DOB: 17-Jun-1945 DOA: 07/02/2022 DOS: the patient was seen and examined on 07/02/2022 PCP: Ellyn Hack, MD  Patient coming from: Home - lives with wife, son; NOK: Omarii, [place], [phone]; Wife, Dena Cammarota, [phone]   Chief Complaint: fall  HPI: Jacob [name] is a 77 y.o. male with medical history significant of ESRD on MWF HD, HTN, HLD, prostate CA s/p radiation treatment, and OSA on at bedtime prn O2 presenting with a fall.   He was walking up the walkway to HD with his walker.  He turned it, took a step back, and lost control of his walker.  He felt backwards and landed on his buttocks.  He hurt his R hip.  No other injuries - but everything else is sore.    ER Course:  Fall on the way to HD.  R hip fracture.  No HD today.  Lost balance, sounds mechanical.  Head CT negative.  Will notify nephrology of need for HD.     Review of Systems: As mentioned in the history of present illness. All other systems reviewed and are negative. 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, has had transfusion   Anxiety and depression    Arthritis    gout   Back pain    BPH with obstruction/lower urinary tract symptoms    Chronic kidney disease    Stage 5 kidney   Constipation    ED (erectile dysfunction)    GERD (gastroesophageal reflux disease)    Hypercholesterolemia    Hypertension    Night sweats    Post-operative nausea and vomiting 04/09/2016   Prostate cancer (HCC) 08/14/2011   Adenocarcinoma,gleason:3+3=6,& 3+4=7,PSA=5.66, s/p radiation therapy   PUD (peptic ulcer disease)    Sleep apnea    does not use Cpap but occasionally uses O2 @ 2L qhs   Stomach cancer (HCC)    Ulcer    peptic ulcer hx   Past Surgical History:  Procedure Laterality Date   AV FISTULA PLACEMENT Right 02/19/2021   Procedure: ARTERIOVENOUS (AV) FISTULA CREATION;   Surgeon: Victorino Sparrow, MD;  Location: [facility] OR;  Service: Vascular;  Laterality: Right;   AV FISTULA PLACEMENT Right 04/21/2022   Procedure: INSERTION OF RIGHT ARM ARTERIOVENOUS (AV) GORE-TEX GRAFT;  Surgeon: Victorino Sparrow, MD;  Location: [facility] OR;  Service: Vascular;  Laterality: Right;   BASCILIC VEIN TRANSPOSITION Right 07/23/2021   Procedure: RIGHT ARM BASILIC VEIN TRANSPOSITION;  Surgeon: Victorino Sparrow, MD;  Location: [facility] OR;  Service: Vascular;  Laterality: Right;  PERIPHERAL NERVE BLOCK   colon polyps bx  11/24/06   colon,transverse and rectosigmoid polyps:tubular adenomas and hyperplastic polyps,no high grade dysplasia or malignancy    COLONOSCOPY WITH PROPOFOL N/A 08/24/2019   Procedure: COLONOSCOPY WITH PROPOFOL;  Surgeon: Shellia Cleverly, DO;  Location: [facility] END[facility]COPY;  Service: Gastroenterology;  Laterality: N/A;   duodenal bx  11/24/06   benign   ESOPHAGOGASTRODUODENOSCOPY N/A 10/27/2015   Procedure: ESOPHAGOGASTRODUODENOSCOPY (EGD);  Surgeon: Iva Boop, MD;  Location: [facility];  Service: Endoscopy;  Laterality: N/A;   EUS N/A 11/08/2015   Procedure: UPPER ENDOSCOPIC ULTRASOUND (EUS) RADIAL;  Surgeon: Rachael Fee, MD;  Location: [facility];  Service: Endoscopy;  Laterality: N/A;   GASTRECTOMY N/A 03/25/2016   Procedure: DISTAL GASTRECTOMY;  Surgeon: Almond Lint, MD;  Location: [facility] OR;  Service: General;  Laterality: N/A;  gastric bx  11/24/06   chronic active gastritis,with metaplasia and focal changaes of xanthelasma   GASTROSTOMY N/A 03/25/2016   Procedure: INSERTION OF FEEDING TUBE;  Surgeon: Almond Lint, MD;  Location: [facility] OR;  Service: General;  Laterality: N/A;   INSERTION OF DIALYSIS CATHETER Left 04/21/2022   Procedure: INSERTION OF TUNNELED DIALYSIS CATHETER, LEFT INTERNAL JUGULAR;  Surgeon: Victorino Sparrow, MD;  Location: [facility] OR;  Service: Vascular;  Laterality: Left;   INSERTION PROSTATE RADIATION SEED  12-29-11   IR GENERIC HISTORICAL  04/01/2016   IR GJ  TUBE CHANGE 04/01/2016 Richarda Overlie, MD [facility]   IR GENERIC HISTORICAL  04/05/2016   IR GJ TUBE CHANGE 04/05/2016 Richarda Overlie, MD [facility]   LAPAROSCOPIC GASTRECTOMY  03/25/2016   Diagnostic laparoscopy, distal gastrectomy with Billroth 2 reconstruction and gastrojejunostomy tube   LAPAROSCOPY N/A 03/25/2016   Procedure: DIAGNOSTIC LAPAROSCOPY;  Surgeon: Almond Lint, MD;  Location: [facility] OR;  Service: General;  Laterality: N/A;   POLYPECTOMY  08/24/2019   Procedure: POLYPECTOMY;  Surgeon: Shellia Cleverly, DO;  Location: [facility];  Service: Gastroenterology;;   PORTACATH PLACEMENT Left 12/04/2015   Procedure: INSERTION PORT-A-CATH;  Surgeon: Almond Lint, MD;  Location: [facility] OR;  Service: General;  Laterality: Left;   PROSTATE BIOPSY  08/14/11   Adenocarcinoma/volume=58.51cc,gleason=3+3=6 & 3+4=7   TONSILLECTOMY     77 years old   Social History:  reports that he quit smoking about 22 years ago. His smoking use included cigarettes. He has a 45.00 pack-year smoking history. He has been exposed to tobacco smoke. He has never used smokeless tobacco. He reports that he does not currently use alcohol after a past usage of about 1.0 standard drink of alcohol per week. He reports that he does not use drugs.  Allergies  Allergen Reactions   Lisinopril Cough    Family History  Problem Relation Age of Onset   Cancer Mother        NOS   Alcohol abuse Father    Alcohol abuse Brother 84   Cancer Paternal Aunt        NOS   Colon cancer Neg Hx     Prior to Admission medications   Medication Sig Start Date End Date Taking? Authorizing Provider  acetaminophen (TYLENOL) 500 MG tablet Take 1,000 mg by mouth daily as needed for moderate pain or headache.    [provider]  allopurinol (ZYLOPRIM) 300 MG tablet Take 150 mg by mouth daily. 04/15/19   [provider]  aspirin EC 81 MG tablet Take 1 tablet (81 mg total) by mouth daily. Swallow whole. 01/10/21   Tobb, Kardie, DO   azithromycin (ZITHROMAX) 250 MG tablet Take 1 tablet (250 mg total) by mouth daily. Take first 2 tablets together, then 1 every day until finished. Patient not taking: Reported on 05/23/2022 02/22/22   Roxy Horseman, PA-C  buPROPion (WELLBUTRIN XL) 300 MG 24 hr tablet Take 300 mg by mouth daily.  05/10/15   [provider]  calcium carbonate (TUMS - DOSED IN MG ELEMENTAL CALCIUM) 500 MG chewable tablet Chew 3 tablets by mouth 2 (two) times daily as needed for indigestion or heartburn.    [provider]  cetirizine (ZYRTEC) 10 MG tablet Take 10 mg by mouth daily.    [provider]  Cholecalciferol (VITAMIN D3) 10 MCG (400 UNIT) CAPS Take 1 capsule (400 Units total) by mouth daily. 04/21/22   Schuh, McKenzi P, PA-C  diclofenac Sodium (VOLTAREN) 1 % GEL  Apply 2 g topically 4 (four) times daily. 11/06/20   White, Elita Boone, NP  donepezil (ARICEPT) 5 MG tablet Take 1 tablet (5 mg total) by mouth at bedtime. 08/13/20   Dohmeier, Porfirio Mylar, MD  ferrous sulfate 325 (65 FE) MG tablet Take 325 mg by mouth daily with breakfast. Patient not taking: Reported on 05/23/2022    [provider]  furosemide (LASIX) 40 MG tablet Take 40 mg by mouth daily.    [provider]  hydrALAZINE (APRESOLINE) 50 MG tablet Take 50 mg by mouth 3 (three) times daily. Patient not taking: Reported on 05/23/2022 02/05/21   [provider]  lidocaine (XYLOCAINE) 5 % ointment Apply 1 application topically as needed. 11/06/20   Valinda Hoar, NP  Melatonin 10 MG TABS Take 10 mg by mouth at bedtime.    [provider]  Nebivolol HCl 20 MG TABS Take 20 mg by mouth at bedtime.    [provider]  oxyCODONE-acetaminophen (PERCOCET) 5-325 MG tablet Take 1 tablet by mouth every 6 (six) hours as needed for severe pain. Patient not taking: Reported on 05/23/2022 04/21/22 04/21/23  Loel Dubonnet P, PA-C  pantoprazole (PROTONIX) 40 MG tablet Take 1 tablet (40 mg total) by mouth  daily before supper. 09/18/18   Darlin Drop, DO  polyethylene glycol powder (GLYCOLAX/MIRALAX) 17 GM/SCOOP powder Take 17 g by mouth 2 (two) times daily. Until daily soft stools OTC 02/22/22   Roxy Horseman, PA-C  rosuvastatin (CRESTOR) 5 MG tablet Take 5 mg by mouth daily. 02/05/21   [provider]  sodium bicarbonate 650 MG tablet Take 650 mg by mouth 2 (two) times daily. Patient not taking: Reported on 05/23/2022    [provider]  sodium zirconium cyclosilicate (LOKELMA) 10 g PACK packet Take 10 g by mouth every other day. Patient not taking: Reported on 05/23/2022    [provider]  tamsulosin (FLOMAX) 0.4 MG CAPS capsule Take 1 capsule by mouth every day 06/21/22   Malen Gauze, MD  venlafaxine XR (EFFEXOR-XR) 75 MG 24 hr capsule Take 75 mg by mouth daily. 07/05/21   [provider]  verapamil (CALAN) 80 MG tablet Take 80 mg by mouth 3 (three) times daily.     [provider]    Physical Exam: Vitals:   07/02/22 1714 07/02/22 1720 07/02/22 1730 07/02/22 1800  BP: (!) 102/46 103/66 109/70   Pulse: 76 73 77 74  Resp: 13 (!) 23 14 16   Temp: 98.1 F (36.7 C)     TempSrc:      SpO2: 100% 100% 100% 97%   General:  Appears calm and comfortable and is in NAD; hip pain with movement Eyes:  PERRLA, EOMI, normal lids, iris ENT:  grossly normal lips & tongue, mmm Neck:  no LAD, masses or thyromegaly Cardiovascular:  RRR, no m/r/g. No LE edema.  Respiratory:   CTA bilaterally with no wheezes/rales/rhonchi.  Normal respiratory effort.   Abdomen:  soft, NT, ND Skin:  no rash or induration seen on limited exam Musculoskeletal:  R leg is shortened and externally rotated Psychiatric:  blunted mood and affect, speech fluent and appropriate, A&O x 3 Neurologic:  CN 2-12 grossly intact, moves all extremities in coordinated fashion other than RLE   Radiological Exams on Admission: Independently reviewed - see discussion in A/P where  applicable  DG Chest 1 View  Result Date: 07/02/2022 CLINICAL DATA:  Pain after fall.  History of gastric cancer EXAM: CHEST  1  VIEW COMPARISON:  X-ray 04/21/2022 FINDINGS: Left upper chest port with tip along the central SVC. No consolidation, pneumothorax or effusion. No edema. Normal cardiopericardial silhouette. Film is under penetrated. IMPRESSION: Chest port.  No acute cardiopulmonary disease. Electronically Signed   By: Karen Kays M.D.   On: 07/02/2022 14:07   DG Knee Complete 4 Views Right  Result Date: 07/02/2022 CLINICAL DATA:  Pain after fall EXAM: RIGHT KNEE - COMPLETE 4 VIEW COMPARISON:  None Available. FINDINGS: Osteopenia. Mild joint space loss of the medial compartment. No fracture or dislocation. Preserved bone mineralization. No joint effusion on lateral view. Chondrocalcinosis of the medial compartment. IMPRESSION: Mild degenerative changes of the medial compartment with chondrocalcinosis. Electronically Signed   By: Karen Kays M.D.   On: 07/02/2022 14:05   DG Hip Unilat W or Wo Pelvis 2-3 Views Right  Result Date: 07/02/2022 CLINICAL DATA:  Pain after fall EXAM: DG HIP (WITH OR WITHOUT PELVIS) 3V RIGHT COMPARISON:  None Available. FINDINGS: Mildly displaced oblique fracture involving the intertrochanteric region of the right hip. Osteopenia. No additional fracture or dislocation. Preserved joint spaces. Degenerative changes seen of the lumbar spine at the edge of the imaging field. IMPRESSION: Mildly displaced intertrochanteric right hip fracture Electronically Signed   By: Karen Kays M.D.   On: 07/02/2022 14:05   CT Head Wo Contrast  Result Date: 07/02/2022 CLINICAL DATA:  Head trauma, minor (Age >= 65y); Neck trauma (Age >= 65y) EXAM: CT HEAD WITHOUT CONTRAST CT CERVICAL SPINE WITHOUT CONTRAST TECHNIQUE: Multidetector CT imaging of the head and cervical spine was performed following the standard protocol without intravenous contrast. Multiplanar CT image reconstructions of  the cervical spine were also generated. RADIATION DOSE REDUCTION: This exam was performed according to the departmental dose-optimization program which includes automated exposure control, adjustment of the mA and/or kV according to patient size and/or use of iterative reconstruction technique. COMPARISON:  CT Head 02/16/21 FINDINGS: CT HEAD FINDINGS Brain: No evidence of acute infarction, hemorrhage, hydrocephalus, extra-axial collection or mass lesion/mass effect. Mineralization of the basal ganglia bilaterally. There is a chronic appearing infarct in the centrum semiovale on the right Vascular: No hyperdense vessel or unexpected calcification. Skull: Normal. Negative for fracture or focal lesion. Sinuses/Orbits: No middle ear or mastoid effusion. Paranasal sinuses are notable for mucosal thickening in the left maxillary sinus and bilateral sphenoid sinuses. Orbits are unremarkable. Other: None. CT CERVICAL SPINE FINDINGS Alignment: Normal. Skull base and vertebrae: No acute fracture. No primary bone lesion or focal pathologic process. Soft tissues and spinal canal: No prevertebral fluid or swelling. No visible canal hematoma. Disc levels:  No evidence of high-grade spinal canal stenosis. Upper chest: Incompletely imaged. Other: No IMPRESSION: 1. No acute intracranial abnormality. 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 2. No acute fracture or traumatic malalignment of the cervical spine. Electronically Signed   By: Lorenza Cambridge M.D.   On: 07/02/2022 13:55   CT Cervical Spine Wo Contrast  Result Date: 07/02/2022 CLINICAL DATA:  Head trauma, minor (Age >= 65y); Neck trauma (Age >= 65y) EXAM: CT HEAD WITHOUT CONTRAST CT CERVICAL SPINE WITHOUT CONTRAST TECHNIQUE: Multidetector CT imaging of the head and cervical spine was performed following the standard protocol without intravenous contrast. Multiplanar CT image reconstructions of the cervical spine were also generated. RADIATION DOSE REDUCTION:  This exam was performed according to the departmental dose-optimization program which includes automated exposure control, adjustment of the mA and/or kV according to patient size and/or use of iterative reconstruction technique. COMPARISON:  CT Head 02/16/21 FINDINGS: CT HEAD FINDINGS Brain: No evidence of acute infarction, hemorrhage, hydrocephalus, extra-axial collection or mass lesion/mass effect. Mineralization of the basal ganglia bilaterally. There is a chronic appearing infarct in the centrum semiovale on the right Vascular: No hyperdense vessel or unexpected calcification. Skull: Normal. Negative for fracture or focal lesion. Sinuses/Orbits: No middle ear or mastoid effusion. Paranasal sinuses are notable for mucosal thickening in the left maxillary sinus and bilateral sphenoid sinuses. Orbits are unremarkable. Other: None. CT CERVICAL SPINE FINDINGS Alignment: Normal. Skull base and vertebrae: No acute fracture. No primary bone lesion or focal pathologic process. Soft tissues and spinal canal: No prevertebral fluid or swelling. No visible canal hematoma. Disc levels:  No evidence of high-grade spinal canal stenosis. Upper chest: Incompletely imaged. Other: No IMPRESSION: 1. No acute intracranial abnormality. 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 2. No acute fracture or traumatic malalignment of the cervical spine. Electronically Signed   By: Lorenza Cambridge M.D.   On: 07/02/2022 13:55    EKG: Independently reviewed.  NSR with rate 77; nonspecific ST changes with no evidence of acute ischemia   Labs on Admission: I have personally reviewed the available labs and imaging studies at the time of the admission.  Pertinent labs:    Glucose 96 BUN 36/Creatinine 6.41/GFR 8 Albumin 2.8 WBC 8.1 Hgb 9.2 - stable   Assessment and Plan:   Hip fracture -Apparently mechanical fall resulting in hip fracture -Orthopedics consulted -NPO after midnight in anticipation of surgical repair  tomorrow -SCDs overnight, start Heparin post-operatively (or as per ortho) -Pain control with Tylenol, Robaxin, Oxycodone, and Dilaudid prn -TOC team consult for rehab placement -Will need PT consult post-operatively -Hip fracture order set utilized -TXA per orthopedics -Fascia iliacus block ordered per anesthesia  Pre-operative stratification -Orthopedic/spinal surgery is associated with an intermediate (1-5%) cardiovascular risk for cardiac death and nonfatal MI -With his h/o ESRD, his revised cardiac index gives a risk estimate of 6.0% -Because of this risk, he is recommended to have pre-operative EKG testing prior to surgery; this was done in the ER -His Detsky's Modified Cardiac Risk Index score is a low cardiac risk -It is reasonable for him to go to the OR without additional evaluation  ESRD on HD -Patient on chronic MWF HD -Nephrology prn order set utilized -He does not appear to be volume overloaded or otherwise in need of acute HD - but he was due for HD today and missed it -Nephrology is aware that patient will need HD and is arranging it for this evening -Continue allopurinol  HTN -Continue nebivolol, verapamil  BPH/remote Prostate CA -Continue tamsulosin  HLD -continue rosuvastatin  OSA -Not on CPAP -Wears nocturnal O2 about 2 nights/week  Mood d/o, dementia -Continue home bupropion, donepezil, melatonin, venlafaxine -Will order delirium precautions    Advance Care Planning:   Code Status: Full Code - Code status was discussed with the patient at the time of admission.  The patient would want to receive full resuscitative measures at this time.   Consults:  Orthopedics; SW, Nutrition; will need PT post-operatively   DVT Prophylaxis: SCDs until approved for Heparin by orthopedics  Family Communication: None present; he declined to have me call his son at the time of admission  Severity of Illness: The appropriate patient status for this patient is  INPATIENT. Inpatient status is judged to be reasonable and necessary in order to provide the required intensity of service to ensure the patient's safety. The patient's presenting symptoms,  physical exam findings, and initial radiographic and laboratory data in the context of their chronic comorbidities is felt to place them at high risk for further clinical deterioration. Furthermore, it is not anticipated that the patient will be medically stable for discharge from the hospital within 2 midnights of admission.   * I certify that at the point of admission it is my clinical judgment that the patient will require inpatient hospital care spanning beyond 2 midnights from the point of admission due to high intensity of service, high risk for further deterioration and high frequency of surveillance required.*  Author: Jonah Blue, MD 07/02/2022 6:04 PM  For on call review [email or web address].

## 2022-07-02 NOTE — Consult Note (Signed)
Reason for Consult:Right hip fx Referring Physician: Alvira Monday Time called: 1415 Time at bedside: 1425  Jacob [name] is an 77 y.o. male.  HPI: Al was going into HD today when he lost his balance and fell backwards. He had immediate right hip pain and could not get up. X-rays showed a right hip fx and orthopedic surgery was consulted. He lives at home with his wife and ambulates with a RW.  Past Medical History:  Diagnosis Date   Anemia    hx iron deficiency   Anxiety    Arthritis    gout   Back pain    BPH with obstruction/lower urinary tract symptoms    Chronic kidney disease    Stage 5 kidney   Constipation    Depression    Dyspnea    ED (erectile dysfunction)    Epigastric pain    GERD (gastroesophageal reflux disease)    Heartburn    History of blood transfusion    History of radiation therapy 11/03/11-12/29/11   prostate   Hypercholesterolemia    Hypertension    Night sweats    Oxygen deficiency    2x per week at night for sleep   Pneumonia    Post-operative nausea and vomiting 04/09/2016   Prostate cancer (HCC) 08/14/2011   Adenocarcinoma,gleason:3+3=6,& 3+4=7,PSA=5.66   PUD (peptic ulcer disease)    Sleep apnea    does not use Cpap but occasionally uses O2 @ 2L qhs   Stomach cancer (HCC)    Ulcer    peptic ulcer hx    Past Surgical History:  Procedure Laterality Date   AV FISTULA PLACEMENT Right 02/19/2021   Procedure: ARTERIOVENOUS (AV) FISTULA CREATION;  Surgeon: Victorino Sparrow, MD;  Location: [facility] OR;  Service: Vascular;  Laterality: Right;   AV FISTULA PLACEMENT Right 04/21/2022   Procedure: INSERTION OF RIGHT ARM ARTERIOVENOUS (AV) GORE-TEX GRAFT;  Surgeon: Victorino Sparrow, MD;  Location: [facility] OR;  Service: Vascular;  Laterality: Right;   BASCILIC VEIN TRANSPOSITION Right 07/23/2021   Procedure: RIGHT ARM BASILIC VEIN TRANSPOSITION;  Surgeon: Victorino Sparrow, MD;  Location: [facility] OR;  Service: Vascular;  Laterality: Right;  PERIPHERAL NERVE BLOCK    colon polyps bx  11/24/06   colon,transverse and rectosigmoid polyps:tubular adenomas and hyperplastic polyps,no high grade dysplasia or malignancy    COLONOSCOPY WITH PROPOFOL N/A 08/24/2019   Procedure: COLONOSCOPY WITH PROPOFOL;  Surgeon: Shellia Cleverly, DO;  Location: [facility];  Service: Gastroenterology;  Laterality: N/A;   duodenal bx  11/24/06   benign   ESOPHAGOGASTRODUODENOSCOPY N/A 10/27/2015   Procedure: ESOPHAGOGASTRODUODENOSCOPY (EGD);  Surgeon: Iva Boop, MD;  Location: [facility];  Service: Endoscopy;  Laterality: N/A;   EUS N/A 11/08/2015   Procedure: UPPER ENDOSCOPIC ULTRASOUND (EUS) RADIAL;  Surgeon: Rachael Fee, MD;  Location: [facility];  Service: Endoscopy;  Laterality: N/A;   GASTRECTOMY N/A 03/25/2016   Procedure: DISTAL GASTRECTOMY;  Surgeon: Almond Lint, MD;  Location: [facility] OR;  Service: General;  Laterality: N/A;   gastric bx  11/24/06   chronic active gastritis,with metaplasia and focal changaes of xanthelasma   GASTROSTOMY N/A 03/25/2016   Procedure: INSERTION OF FEEDING TUBE;  Surgeon: Almond Lint, MD;  Location: [facility] OR;  Service: General;  Laterality: N/A;   INSERTION OF DIALYSIS CATHETER Left 04/21/2022   Procedure: INSERTION OF TUNNELED DIALYSIS CATHETER, LEFT INTERNAL JUGULAR;  Surgeon: Victorino Sparrow, MD;  Location: [facility] OR;  Service: Vascular;  Laterality: Left;   INSERTION PROSTATE RADIATION  SEED  12-29-11   IR GENERIC HISTORICAL  04/01/2016   IR GJ TUBE CHANGE 04/01/2016 Richarda Overlie, MD [facility]   IR GENERIC HISTORICAL  04/05/2016   IR GJ TUBE CHANGE 04/05/2016 Richarda Overlie, MD [facility]   LAPAROSCOPIC GASTRECTOMY  03/25/2016   Diagnostic laparoscopy, distal gastrectomy with Billroth 2 reconstruction and gastrojejunostomy tube   LAPAROSCOPY N/A 03/25/2016   Procedure: DIAGNOSTIC LAPAROSCOPY;  Surgeon: Almond Lint, MD;  Location: [facility] OR;  Service: General;  Laterality: N/A;   POLYPECTOMY  08/24/2019   Procedure: POLYPECTOMY;  Surgeon:  Shellia Cleverly, DO;  Location: [facility];  Service: Gastroenterology;;   PORTACATH PLACEMENT Left 12/04/2015   Procedure: INSERTION PORT-A-CATH;  Surgeon: Almond Lint, MD;  Location: [facility] OR;  Service: General;  Laterality: Left;   PROSTATE BIOPSY  08/14/11   Adenocarcinoma/volume=58.51cc,gleason=3+3=6 & 3+4=7   TONSILLECTOMY     77 years old    Family History  Problem Relation Age of Onset   Cancer Mother        NOS   Alcohol abuse Father    Alcohol abuse Brother 6   Cancer Paternal Aunt        NOS   Colon cancer Neg Hx     Social History:  reports that he quit smoking about 22 years ago. His smoking use included cigarettes. He has a 45.00 pack-year smoking history. He has been exposed to tobacco smoke. He has never used smokeless tobacco. He reports that he does not currently use alcohol after a past usage of about 1.0 standard drink of alcohol per week. He reports that he does not use drugs.  Allergies:  Allergies  Allergen Reactions   Lisinopril Cough    Medications: I have reviewed the patient's current medications.  Results for orders placed or performed during the hospital encounter of 07/02/22 (from the past 48 hour(s))  CBC with Differential     Status: Abnormal   Collection Time: 07/02/22  1:39 PM  Result Value Ref Range   WBC 8.1 4.0 - 10.5 K/uL   RBC 2.85 (L) 4.22 - 5.81 MIL/uL   Hemoglobin 9.2 (L) 13.0 - 17.0 g/dL   HCT 29.5 (L) 62.1 - 30.8 %   MCV 100.0 80.0 - 100.0 fL   MCH 32.3 26.0 - 34.0 pg   MCHC 32.3 30.0 - 36.0 g/dL   RDW 65.7 84.6 - 96.2 %   Platelets 187 150 - 400 K/uL   nRBC 0.0 0.0 - 0.2 %   Neutrophils Relative % 77 %   Neutro Abs 6.2 1.7 - 7.7 K/uL   Lymphocytes Relative 12 %   Lymphs Abs 1.0 0.7 - 4.0 K/uL   Monocytes Relative 7 %   Monocytes Absolute 0.6 0.1 - 1.0 K/uL   Eosinophils Relative 3 %   Eosinophils Absolute 0.3 0.0 - 0.5 K/uL   Basophils Relative 1 %   Basophils Absolute 0.0 0.0 - 0.1 K/uL   Immature Granulocytes 0 %    Abs Immature Granulocytes 0.03 0.00 - 0.07 K/uL    Comment: Performed at [facility], [address] [place], [place] [address]  Comprehensive metabolic panel     Status: Abnormal   Collection Time: 07/02/22  1:39 PM  Result Value Ref Range   Sodium 137 135 - 145 mmol/L   Potassium 4.0 3.5 - 5.1 mmol/L   Chloride 96 (L) 98 - 111 mmol/L   CO2 28 22 - 32 mmol/L   Glucose, Bld 96 70 - 99  mg/dL    Comment: Glucose reference range applies only to samples taken after fasting for at least 8 hours.   BUN 36 (H) 8 - 23 mg/dL   Creatinine, Ser 1.61 (H) 0.61 - 1.24 mg/dL   Calcium 8.8 (L) 8.9 - 10.3 mg/dL   Total Protein 6.1 (L) 6.5 - 8.1 g/dL   Albumin 2.8 (L) 3.5 - 5.0 g/dL   AST 13 (L) 15 - 41 U/L   ALT 8 0 - 44 U/L   Alkaline Phosphatase 46 38 - 126 U/L   Total Bilirubin 0.5 0.3 - 1.2 mg/dL   GFR, Estimated 8 (L) >60 mL/min    Comment: (NOTE) Calculated using the CKD-EPI Creatinine Equation (2021)    Anion gap 13 5 - 15    Comment: Performed at [facility], [address], [place], [place] [address]  I-stat chem 8, ED (not at [facility], [facility] or [facility])     Status: Abnormal   Collection Time: 07/02/22  1:52 PM  Result Value Ref Range   Sodium 135 135 - 145 mmol/L   Potassium 3.9 3.5 - 5.1 mmol/L   Chloride 100 98 - 111 mmol/L   BUN 34 (H) 8 - 23 mg/dL   Creatinine, Ser 5.40 (H) 0.61 - 1.24 mg/dL   Glucose, Bld 95 70 - 99 mg/dL    Comment: Glucose reference range applies only to samples taken after fasting for at least 8 hours.   Calcium, Ion 0.88 (LL) 1.15 - 1.40 mmol/L   TCO2 31 22 - 32 mmol/L   Hemoglobin 9.5 (L) 13.0 - 17.0 g/dL   HCT 98.1 (L) 19.1 - 47.8 %   Comment NOTIFIED PHYSICIAN     DG Chest 1 View  Result Date: 07/02/2022 CLINICAL DATA:  Pain after fall.  History of gastric cancer EXAM: CHEST  1 VIEW COMPARISON:  X-ray 04/21/2022 FINDINGS: Left upper chest port with tip along the central SVC. No consolidation, pneumothorax or effusion. No edema. Normal  cardiopericardial silhouette. Film is under penetrated. IMPRESSION: Chest port.  No acute cardiopulmonary disease. Electronically Signed   By: Karen Kays M.D.   On: 07/02/2022 14:07   DG Knee Complete 4 Views Right  Result Date: 07/02/2022 CLINICAL DATA:  Pain after fall EXAM: RIGHT KNEE - COMPLETE 4 VIEW COMPARISON:  None Available. FINDINGS: Osteopenia. Mild joint space loss of the medial compartment. No fracture or dislocation. Preserved bone mineralization. No joint effusion on lateral view. Chondrocalcinosis of the medial compartment. IMPRESSION: Mild degenerative changes of the medial compartment with chondrocalcinosis. Electronically Signed   By: Karen Kays M.D.   On: 07/02/2022 14:05   DG Hip Unilat W or Wo Pelvis 2-3 Views Right  Result Date: 07/02/2022 CLINICAL DATA:  Pain after fall EXAM: DG HIP (WITH OR WITHOUT PELVIS) 3V RIGHT COMPARISON:  None Available. FINDINGS: Mildly displaced oblique fracture involving the intertrochanteric region of the right hip. Osteopenia. No additional fracture or dislocation. Preserved joint spaces. Degenerative changes seen of the lumbar spine at the edge of the imaging field. IMPRESSION: Mildly displaced intertrochanteric right hip fracture Electronically Signed   By: Karen Kays M.D.   On: 07/02/2022 14:05   CT Head Wo Contrast  Result Date: 07/02/2022 CLINICAL DATA:  Head trauma, minor (Age >= 65y); Neck trauma (Age >= 65y) EXAM: CT HEAD WITHOUT CONTRAST CT CERVICAL SPINE WITHOUT CONTRAST TECHNIQUE: Multidetector CT imaging of the head and cervical spine was performed following the standard protocol without intravenous contrast. Multiplanar CT image reconstructions  of the cervical spine were also generated. RADIATION DOSE REDUCTION: This exam was performed according to the departmental dose-optimization program which includes automated exposure control, adjustment of the mA and/or kV according to patient size and/or use of iterative reconstruction  technique. COMPARISON:  CT Head 02/16/21 FINDINGS: CT HEAD FINDINGS Brain: No evidence of acute infarction, hemorrhage, hydrocephalus, extra-axial collection or mass lesion/mass effect. Mineralization of the basal ganglia bilaterally. There is a chronic appearing infarct in the centrum semiovale on the right Vascular: No hyperdense vessel or unexpected calcification. Skull: Normal. Negative for fracture or focal lesion. Sinuses/Orbits: No middle ear or mastoid effusion. Paranasal sinuses are notable for mucosal thickening in the left maxillary sinus and bilateral sphenoid sinuses. Orbits are unremarkable. Other: None. CT CERVICAL SPINE FINDINGS Alignment: Normal. Skull base and vertebrae: No acute fracture. No primary bone lesion or focal pathologic process. Soft tissues and spinal canal: No prevertebral fluid or swelling. No visible canal hematoma. Disc levels:  No evidence of high-grade spinal canal stenosis. Upper chest: Incompletely imaged. Other: No IMPRESSION: 1. No acute intracranial abnormality. 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 2. No acute fracture or traumatic malalignment of the cervical spine. Electronically Signed   By: Lorenza Cambridge M.D.   On: 07/02/2022 13:55   CT Cervical Spine Wo Contrast  Result Date: 07/02/2022 CLINICAL DATA:  Head trauma, minor (Age >= 65y); Neck trauma (Age >= 65y) EXAM: CT HEAD WITHOUT CONTRAST CT CERVICAL SPINE WITHOUT CONTRAST TECHNIQUE: Multidetector CT imaging of the head and cervical spine was performed following the standard protocol without intravenous contrast. Multiplanar CT image reconstructions of the cervical spine were also generated. RADIATION DOSE REDUCTION: This exam was performed according to the departmental dose-optimization program which includes automated exposure control, adjustment of the mA and/or kV according to patient size and/or use of iterative reconstruction technique. COMPARISON:  CT Head 02/16/21 FINDINGS: CT HEAD FINDINGS  Brain: No evidence of acute infarction, hemorrhage, hydrocephalus, extra-axial collection or mass lesion/mass effect. Mineralization of the basal ganglia bilaterally. There is a chronic appearing infarct in the centrum semiovale on the right Vascular: No hyperdense vessel or unexpected calcification. Skull: Normal. Negative for fracture or focal lesion. Sinuses/Orbits: No middle ear or mastoid effusion. Paranasal sinuses are notable for mucosal thickening in the left maxillary sinus and bilateral sphenoid sinuses. Orbits are unremarkable. Other: None. CT CERVICAL SPINE FINDINGS Alignment: Normal. Skull base and vertebrae: No acute fracture. No primary bone lesion or focal pathologic process. Soft tissues and spinal canal: No prevertebral fluid or swelling. No visible canal hematoma. Disc levels:  No evidence of high-grade spinal canal stenosis. Upper chest: Incompletely imaged. Other: No IMPRESSION: 1. No acute intracranial abnormality. 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 2. No acute fracture or traumatic malalignment of the cervical spine. Electronically Signed   By: Lorenza Cambridge M.D.   On: 07/02/2022 13:55    Review of Systems  HENT:  Negative for ear discharge, ear pain, hearing loss and tinnitus.   Eyes:  Negative for photophobia and pain.  Respiratory:  Negative for cough and shortness of breath.   Cardiovascular:  Negative for chest pain.  Gastrointestinal:  Negative for abdominal pain, nausea and vomiting.  Genitourinary:  Negative for dysuria, flank pain, frequency and urgency.  Musculoskeletal:  Positive for arthralgias (Right hip). Negative for back pain, myalgias and neck pain.  Neurological:  Negative for dizziness and headaches.  Hematological:  Does not bruise/bleed easily.  Psychiatric/Behavioral:  The patient is not nervous/anxious.  Blood pressure 108/70, pulse 77, temperature 98.6 F (37 C), temperature source Oral, resp. rate 16, SpO2 95 %. Physical  Exam Constitutional:      General: He is not in acute distress.    Appearance: He is well-developed. He is not diaphoretic.  HENT:     Head: Normocephalic and atraumatic.  Eyes:     General: No scleral icterus.       Right eye: No discharge.        Left eye: No discharge.     Conjunctiva/sclera: Conjunctivae normal.  Cardiovascular:     Rate and Rhythm: Normal rate and regular rhythm.  Pulmonary:     Effort: Pulmonary effort is normal. No respiratory distress.  Musculoskeletal:     Cervical back: Normal range of motion.     Comments: RLE No traumatic wounds, ecchymosis, or rash  Mod TTP hip  No knee or ankle effusion  Knee stable to varus/ valgus and anterior/posterior stress  Sens DPN, SPN, TN intact  Motor EHL, ext, flex, evers 5/5  DP 1+, PT 0, No significant edema  Skin:    General: Skin is warm and dry.  Neurological:     Mental Status: He is alert.  Psychiatric:        Mood and Affect: Mood normal.        Behavior: Behavior normal.     Assessment/Plan: Right hip fx -- Plan IMN tomorrow with Dr. Susa Simmonds. Please keep NPO after MN.    Freeman Caldron, PA-C Orthopedic Surgery [phone] 07/02/2022, 2:29 PM

## 2022-07-03 ENCOUNTER — Encounter (HOSPITAL_COMMUNITY): Payer: Self-pay | Admitting: Internal Medicine

## 2022-07-03 ENCOUNTER — Inpatient Hospital Stay (HOSPITAL_COMMUNITY): Payer: Medicare Other

## 2022-07-03 ENCOUNTER — Encounter (HOSPITAL_COMMUNITY)
Admission: EM | Disposition: A | Discharge status: Skilled Nursing Facility | Payer: Self-pay | Source: Home / Self Care | Attending: Internal Medicine

## 2022-07-03 ENCOUNTER — Inpatient Hospital Stay (HOSPITAL_COMMUNITY): Payer: Medicare Other | Admitting: Anesthesiology

## 2022-07-03 ENCOUNTER — Other Ambulatory Visit: Payer: Self-pay

## 2022-07-03 DIAGNOSIS — N186 End stage renal disease: Secondary | ICD-10-CM | POA: Diagnosis not present

## 2022-07-03 DIAGNOSIS — I10 Essential (primary) hypertension: Secondary | ICD-10-CM

## 2022-07-03 DIAGNOSIS — G4730 Sleep apnea, unspecified: Secondary | ICD-10-CM

## 2022-07-03 DIAGNOSIS — F32A Depression, unspecified: Secondary | ICD-10-CM | POA: Diagnosis not present

## 2022-07-03 DIAGNOSIS — E782 Mixed hyperlipidemia: Secondary | ICD-10-CM

## 2022-07-03 DIAGNOSIS — S72141A Displaced intertrochanteric fracture of right femur, initial encounter for closed fracture: Secondary | ICD-10-CM

## 2022-07-03 DIAGNOSIS — S72001P Fracture of unspecified part of neck of right femur, subsequent encounter for closed fracture with malunion: Secondary | ICD-10-CM | POA: Diagnosis not present

## 2022-07-03 DIAGNOSIS — Z992 Dependence on renal dialysis: Secondary | ICD-10-CM

## 2022-07-03 DIAGNOSIS — Z87891 Personal history of nicotine dependence: Secondary | ICD-10-CM

## 2022-07-03 HISTORY — PX: INTRAMEDULLARY (IM) NAIL INTERTROCHANTERIC: SHX5875

## 2022-07-03 LAB — BASIC METABOLIC PANEL
Anion gap: 11 (ref 5–15)
BUN: 13 mg/dL (ref 8–23)
CO2: 29 mmol/L (ref 22–32)
Calcium: 8.7 mg/dL — ABNORMAL LOW (ref 8.9–10.3)
Chloride: 95 mmol/L — ABNORMAL LOW (ref 98–111)
Creatinine, Ser: 3.48 mg/dL — ABNORMAL HIGH (ref 0.61–1.24)
GFR, Estimated: 17 mL/min — ABNORMAL LOW (ref >60)
Glucose, Bld: 102 mg/dL — ABNORMAL HIGH (ref 70–99)
Potassium: 3.9 mmol/L (ref 3.5–5.1)
Sodium: 135 mmol/L (ref 135–145)

## 2022-07-03 LAB — CBC
HCT: 26.5 % — ABNORMAL LOW (ref 39.0–52.0)
Hemoglobin: 8.7 g/dL — ABNORMAL LOW (ref 13.0–17.0)
MCH: 32.5 pg (ref 26.0–34.0)
MCHC: 32.8 g/dL (ref 30.0–36.0)
MCV: 98.9 fL (ref 80.0–100.0)
Platelets: 187 10*3/uL (ref 150–400)
RBC: 2.68 MIL/uL — ABNORMAL LOW (ref 4.22–5.81)
RDW: 14.6 % (ref 11.5–15.5)
WBC: 10.3 10*3/uL (ref 4.0–10.5)
nRBC: 0 % (ref 0.0–0.2)

## 2022-07-03 LAB — SURGICAL PCR SCREEN
MRSA, PCR: NEGATIVE
Staphylococcus aureus: NEGATIVE

## 2022-07-03 LAB — HEPATITIS B SURFACE ANTIBODY, QUANTITATIVE: Hep B S AB Quant (Post): <3.5 m[IU]/mL — ABNORMAL LOW (ref >9.9)

## 2022-07-03 SURGERY — FIXATION, FRACTURE, INTERTROCHANTERIC, WITH INTRAMEDULLARY ROD
Anesthesia: General | Site: Hip | Laterality: Right

## 2022-07-03 MED ORDER — CHLORHEXIDINE GLUCONATE 4 % EX SOLN: 60.0000 mL | Freq: Once | CUTANEOUS | Status: DC

## 2022-07-03 MED ORDER — ROCURONIUM BROMIDE 10 MG/ML (PF) SYRINGE
PREFILLED_SYRINGE | INTRAVENOUS | Status: AC
  Filled 2022-07-03: qty 10

## 2022-07-03 MED ORDER — SUGAMMADEX SODIUM 200 MG/2ML IV SOLN
INTRAVENOUS | Status: DC | PRN
  Administered 2022-07-03: 175 mg via INTRAVENOUS

## 2022-07-03 MED ORDER — CHLORHEXIDINE GLUCONATE 0.12 % MT SOLN: 15.0000 mL | Freq: Once | OROMUCOSAL | Status: AC

## 2022-07-03 MED ORDER — FENTANYL CITRATE (PF) 250 MCG/5ML IJ SOLN
INTRAMUSCULAR | Status: DC | PRN
  Administered 2022-07-03: 100 ug via INTRAVENOUS

## 2022-07-03 MED ORDER — ONDANSETRON HCL 4 MG/2ML IJ SOLN: 4.0000 mg | Freq: Four times a day (QID) | INTRAMUSCULAR | Status: DC | PRN

## 2022-07-03 MED ORDER — ROCURONIUM BROMIDE 10 MG/ML (PF) SYRINGE
PREFILLED_SYRINGE | INTRAVENOUS | Status: DC | PRN
  Administered 2022-07-03: 50 mg via INTRAVENOUS

## 2022-07-03 MED ORDER — DEXAMETHASONE SODIUM PHOSPHATE 10 MG/ML IJ SOLN
INTRAMUSCULAR | Status: AC
  Filled 2022-07-03: qty 1

## 2022-07-03 MED ORDER — FENTANYL CITRATE (PF) 100 MCG/2ML IJ SOLN
INTRAMUSCULAR | Status: AC
  Filled 2022-07-03: qty 2

## 2022-07-03 MED ORDER — PROPOFOL 10 MG/ML IV BOLUS
INTRAVENOUS | Status: DC | PRN
  Administered 2022-07-03: 110 mg via INTRAVENOUS

## 2022-07-03 MED ORDER — VERAPAMIL HCL 40 MG PO TABS
80.0000 mg | ORAL_TABLET | Freq: Three times a day (TID) | ORAL | Status: DC
  Administered 2022-07-04 (×2): 80 mg via ORAL
  Filled 2022-07-03 (×3): qty 2
  Filled 2022-07-03: qty 1
  Filled 2022-07-03: qty 2

## 2022-07-03 MED ORDER — ONDANSETRON HCL 4 MG/2ML IJ SOLN
INTRAMUSCULAR | Status: AC
  Filled 2022-07-03: qty 2

## 2022-07-03 MED ORDER — FENTANYL CITRATE (PF) 250 MCG/5ML IJ SOLN
INTRAMUSCULAR | Status: AC
  Filled 2022-07-03: qty 5

## 2022-07-03 MED ORDER — SODIUM CHLORIDE 0.9 % IV SOLN: INTRAVENOUS | Status: DC

## 2022-07-03 MED ORDER — PHENYLEPHRINE 80 MCG/ML (10ML) SYRINGE FOR IV PUSH (FOR BLOOD PRESSURE SUPPORT)
PREFILLED_SYRINGE | INTRAVENOUS | Status: DC | PRN
  Administered 2022-07-03: 80 ug via INTRAVENOUS

## 2022-07-03 MED ORDER — PHENYLEPHRINE HCL-NACL 20-0.9 MG/250ML-% IV SOLN
INTRAVENOUS | Status: DC | PRN
  Administered 2022-07-03: 15 ug via INTRAVENOUS
  Administered 2022-07-03: 20 ug/min via INTRAVENOUS

## 2022-07-03 MED ORDER — RENA-VITE PO TABS
1.0000 | ORAL_TABLET | Freq: Every day | ORAL | Status: DC
  Administered 2022-07-04 – 2022-07-08 (×5): 1 via ORAL
  Filled 2022-07-03 (×5): qty 1

## 2022-07-03 MED ORDER — FENTANYL CITRATE (PF) 100 MCG/2ML IJ SOLN
25.0000 ug | INTRAMUSCULAR | Status: DC | PRN
  Administered 2022-07-03: 25 ug via INTRAVENOUS

## 2022-07-03 MED ORDER — CEFAZOLIN SODIUM-DEXTROSE 2-4 GM/100ML-% IV SOLN
2.0000 g | Freq: Two times a day (BID) | INTRAVENOUS | Status: AC
  Administered 2022-07-03 – 2022-07-04 (×3): 2 g via INTRAVENOUS
  Filled 2022-07-03 (×3): qty 100

## 2022-07-03 MED ORDER — PROPOFOL 10 MG/ML IV BOLUS
INTRAVENOUS | Status: AC
  Filled 2022-07-03: qty 20

## 2022-07-03 MED ORDER — ORAL CARE MOUTH RINSE: 15.0000 mL | Freq: Once | OROMUCOSAL | Status: AC

## 2022-07-03 MED ORDER — TAMSULOSIN HCL 0.4 MG PO CAPS
0.4000 mg | ORAL_CAPSULE | Freq: Every day | ORAL | Status: DC
  Administered 2022-07-04 – 2022-07-09 (×6): 0.4 mg via ORAL
  Filled 2022-07-03 (×6): qty 1

## 2022-07-03 MED ORDER — DEXAMETHASONE SODIUM PHOSPHATE 10 MG/ML IJ SOLN
INTRAMUSCULAR | Status: DC | PRN
  Administered 2022-07-03: 10 mg via INTRAVENOUS

## 2022-07-03 MED ORDER — OXYCODONE HCL 5 MG/5ML PO SOLN: 5.0000 mg | Freq: Once | ORAL | Status: DC | PRN

## 2022-07-03 MED ORDER — OXYCODONE HCL 5 MG PO TABS: 5.0000 mg | ORAL_TABLET | Freq: Once | ORAL | Status: DC | PRN

## 2022-07-03 MED ORDER — CHLORHEXIDINE GLUCONATE 0.12 % MT SOLN
OROMUCOSAL | Status: AC
  Administered 2022-07-03: 15 mL via OROMUCOSAL
  Filled 2022-07-03: qty 15

## 2022-07-03 MED ORDER — CEFAZOLIN SODIUM-DEXTROSE 2-4 GM/100ML-% IV SOLN
2.0000 g | INTRAVENOUS | Status: AC
  Administered 2022-07-03: 2 g via INTRAVENOUS
  Filled 2022-07-03: qty 100

## 2022-07-03 MED ORDER — POVIDONE-IODINE 10 % EX SWAB: 2.0000 | Freq: Once | CUTANEOUS | Status: DC

## 2022-07-03 MED ORDER — LIDOCAINE 2% (20 MG/ML) 5 ML SYRINGE
INTRAMUSCULAR | Status: DC | PRN
  Administered 2022-07-03: 40 mg via INTRAVENOUS

## 2022-07-03 MED ORDER — 0.9 % SODIUM CHLORIDE (POUR BTL) OPTIME
TOPICAL | Status: DC | PRN
  Administered 2022-07-03: 1000 mL

## 2022-07-03 MED ORDER — LIDOCAINE 2% (20 MG/ML) 5 ML SYRINGE
INTRAMUSCULAR | Status: AC
  Filled 2022-07-03: qty 5

## 2022-07-03 SURGICAL SUPPLY — 56 items
APL PRP STRL LF DISP 70% ISPRP (MISCELLANEOUS) ×1
BAG COUNTER SPONGE SURGICOUNT (BAG) ×2 IMPLANT
BAG SPNG CNTER NS LX DISP (BAG)
BIT DRILL 4.0X165 AO STYLE (BIT) IMPLANT
BIT DRILL 4.0X280 (BIT) IMPLANT
BLADE SURG 15 STRL LF DISP TIS (BLADE) ×2 IMPLANT
BLADE SURG 15 STRL SS (BLADE) ×1
BNDG GAUZE DERMACEA FLUFF 4 (GAUZE/BANDAGES/DRESSINGS) ×2 IMPLANT
BNDG GZE DERMACEA 4 6PLY (GAUZE/BANDAGES/DRESSINGS)
CHLORAPREP W/TINT 26 (MISCELLANEOUS) IMPLANT
COVER PERINEAL POST (MISCELLANEOUS) ×2 IMPLANT
COVER SURGICAL LIGHT HANDLE (MISCELLANEOUS) ×2 IMPLANT
DRAPE STERI IOBAN 125X83 (DRAPES) ×2 IMPLANT
DRESSING MEPILEX FLEX 4X4 (GAUZE/BANDAGES/DRESSINGS) IMPLANT
DRSG MEPILEX FLEX 4X4 (GAUZE/BANDAGES/DRESSINGS)
DRSG MEPILEX POST OP 4X8 (GAUZE/BANDAGES/DRESSINGS) ×2 IMPLANT
DRSG XEROFORM 1X8 (GAUZE/BANDAGES/DRESSINGS) IMPLANT
DURAPREP 26ML APPLICATOR (WOUND CARE) ×2 IMPLANT
ELECT REM PT RETURN 9FT ADLT (ELECTROSURGICAL) ×1
ELECTRODE REM PT RTRN 9FT ADLT (ELECTROSURGICAL) ×2 IMPLANT
FACESHIELD WRAPAROUND (MASK) IMPLANT
FACESHIELD WRAPAROUND OR TEAM (MASK) ×2 IMPLANT
GAUZE PAD ABD 8X10 STRL (GAUZE/BANDAGES/DRESSINGS) ×4 IMPLANT
GAUZE SPONGE 4X4 12PLY STRL (GAUZE/BANDAGES/DRESSINGS) IMPLANT
GAUZE XEROFORM 5X9 LF (GAUZE/BANDAGES/DRESSINGS) ×2 IMPLANT
GLOVE BIO SURGEON STRL SZ8 (GLOVE) ×2 IMPLANT
GLOVE BIOGEL PI IND STRL 8 (GLOVE) ×2 IMPLANT
GLOVE ORTHO TXT STRL SZ7.5 (GLOVE) ×2 IMPLANT
GOWN STRL REUS W/ TWL LRG LVL3 (GOWN DISPOSABLE) ×2 IMPLANT
GOWN STRL REUS W/ TWL XL LVL3 (GOWN DISPOSABLE) ×4 IMPLANT
GOWN STRL REUS W/TWL LRG LVL3 (GOWN DISPOSABLE) ×1
GOWN STRL REUS W/TWL XL LVL3 (GOWN DISPOSABLE) ×2
KIT BASIN OR (CUSTOM PROCEDURE TRAY) ×2 IMPLANT
KIT TURNOVER KIT B (KITS) ×2 IMPLANT
MANIFOLD NEPTUNE II (INSTRUMENTS) ×2 IMPLANT
NAIL 9X130X20 SHORT (Nail) IMPLANT
NS IRRIG 1000ML POUR BTL (IV SOLUTION) ×2 IMPLANT
PACK GENERAL/GYN (CUSTOM PROCEDURE TRAY) ×2 IMPLANT
PAD ARMBOARD 7.5X6 YLW CONV (MISCELLANEOUS) ×4 IMPLANT
PAD CAST 4YDX4 CTTN HI CHSV (CAST SUPPLIES) ×4 IMPLANT
PADDING CAST COTTON 4X4 STRL (CAST SUPPLIES)
PIN GUIDE THRD AR 3.2X330 (PIN) IMPLANT
SCREW LAG GALILEO FEM 10.5X100 (Screw) IMPLANT
SCREW LOCK CORT 5.0X38 (Screw) IMPLANT
STAPLER VISISTAT 35W (STAPLE) ×2 IMPLANT
SUT MNCRL AB 3-0 PS2 27 (SUTURE) IMPLANT
SUT MON AB 2-0 CT1 36 (SUTURE) IMPLANT
SUT VIC AB 0 CT1 27 (SUTURE)
SUT VIC AB 0 CT1 27XBRD ANBCTR (SUTURE) ×4 IMPLANT
SUT VIC AB 2-0 CT1 27 (SUTURE) ×1
SUT VIC AB 2-0 CT1 TAPERPNT 27 (SUTURE) ×4 IMPLANT
TAPE CLOTH SURG 6X10 WHT LF (GAUZE/BANDAGES/DRESSINGS) IMPLANT
TOOL ACTIVATION (INSTRUMENTS) IMPLANT
TOWEL GREEN STERILE (TOWEL DISPOSABLE) ×2 IMPLANT
TOWEL GREEN STERILE FF (TOWEL DISPOSABLE) ×2 IMPLANT
WATER STERILE IRR 1000ML POUR (IV SOLUTION) ×2 IMPLANT

## 2022-07-03 NOTE — TOC CAGE-AID Note (Signed)
Transition of Care [facility]) - CAGE-AID Screening  Patient Details  Name: Jacob [name] MRN: [id number] Date of Birth: 07-12-1945  Clinical Narrative:  Patient denies any alcohol or drug use, no need for substance abuse resources at this time.  CAGE-AID Screening:    Have You Ever Felt You Ought to Cut Down on Your Drinking or Drug Use?: No Have People Annoyed You By Critizing Your Drinking Or Drug Use?: No Have You Felt Bad Or Guilty About Your Drinking Or Drug Use?: No Have You Ever Had a Drink or Used Drugs First Thing In The Morning to Steady Your Nerves or to Get Rid of a Hangover?: No CAGE-AID Score: 0  Substance Abuse Education Offered: No

## 2022-07-03 NOTE — Progress Notes (Signed)
[facility] Progress Note   Subjective: Seen in room - Feels better today than yesterday. For hip surgery today. Completed dialysis overnight -minimal UF.   Objective Vitals:   07/02/22 2148 07/02/22 2258 07/03/22 0125 07/03/22 0359  BP:  113/78  (!) 91/55  Pulse:  86  88  Resp:  16 17 17   Temp: 97.7 F (36.5 C) 98 F (36.7 C) 98.4 F (36.9 C) 98.4 F (36.9 C)  TempSrc: Oral Oral    SpO2: (P) 98% 98%  97%  Weight:  84.4 kg    Height:  6\' 2"  (1.88 m)        Additional Objective Labs: Basic Metabolic Panel: Recent Labs  Lab 07/02/22 1339 07/02/22 1352 07/03/22 0104  NA 137 135 135  K 4.0 3.9 3.9  CL 96* 100 95*  CO2 28  --  29  GLUCOSE 96 95 102*  BUN 36* 34* 13  CREATININE 6.41* 7.10* 3.48*  CALCIUM 8.8*  --  8.7*   CBC: Recent Labs  Lab 07/02/22 1339 07/02/22 1352 07/03/22 0104  WBC 8.1  --  10.3  NEUTROABS 6.2  --   --   HGB 9.2* 9.5* 8.7*  HCT 28.5* 28.0* 26.5*  MCV 100.0  --  98.9  PLT 187  --  187   Blood Culture    Component Value Date/Time   SDES BLOOD LEFT HAND 09/09/2018 1122   SPECREQUEST  09/09/2018 1122    BOTTLES DRAWN AEROBIC ONLY Blood Culture results may not be optimal due to an inadequate volume of blood received in culture bottles   CULT  09/09/2018 1122    NO GROWTH 5 DAYS Performed at [facility], [address], [place], [place] [address]    REPTSTATUS 09/14/2018 FINAL 09/09/2018 1122     Physical Exam General: Elderly man, nad Heart: RRR Lungs: Clear bilaterally  Abdomen: soft non-tender Extremities: No LE edema  Dialysis Access: R AVG +bruit   Medications:  methocarbamol (ROBAXIN) IV      allopurinol  150 mg Oral Daily   aspirin EC  81 mg Oral Daily   buPROPion  300 mg Oral Daily   [START ON 07/04/2022] calcitRIOL  0.5 mcg Oral Q M,W,F-HD   Chlorhexidine Gluconate Cloth  6 each Topical Q0600   cinacalcet  30 mg Oral Q M,W,F-1800   docusate sodium  100 mg Oral BID   donepezil  5 mg Oral  QHS   loratadine  10 mg Oral Daily   melatonin  10 mg Oral QHS   nebivolol  20 mg Oral QHS   pantoprazole  40 mg Oral QAC supper   polyethylene glycol  17 g Oral BID   rosuvastatin  5 mg Oral Daily   [START ON 07/04/2022] tamsulosin  0.4 mg Oral Daily   venlafaxine XR  75 mg Oral Daily   [START ON 07/04/2022] verapamil  80 mg Oral TID    Dialysis Orders:  [facility] MWF 4:00  400/A1.5x EDW 86kg 2K/2Ca AVG  Heparin 1500 Mircera 75 q 2 wks -last 6/12 Calcitriol 0.5 TIW, Sensipar 30 TIW   Assessment/Plan:  R hip fracture d/t mechanical fall - Ortho consulted. For surgical repair  today   ESRD -  HD MWF. Continue on schedule. Next HD 6/21.   Hypertension/volume  - BP on low side. Volume ok. Minimal UF.   Anemia  - Hgb 9.2>8.7  on ESA. Follow trends   Metabolic bone disease -  Ca acceptable. Continue binders/calcitriol/Sensipar when  eating   Nutrition - Renal diet/Prot supp when eating.   Tomasa Blase PA-C [facility] 07/03/2022,9:11 AM

## 2022-07-03 NOTE — Progress Notes (Signed)
PROGRESS NOTE    Jacob [name]  ZOX:[id number] DOB: 05-24-1945 DOA: 07/02/2022 PCP: Ellyn Hack, MD    Chief Complaint  Patient presents with   Fall    Brief Narrative:  Patient pleasant 77 year old gentleman medical history significant of ESRD on HD Monday Wednesday Friday, hypertension, hyperlipidemia, prostate cancer status post radiation treatment, OSA on bedtime as needed O2 presented with a fall while walking up to walk away with his walker to hemodialysis.  Patient seen in the ED imaging done concerning for right hip fracture.  Patient seen by nephrology and underwent hemodialysis the evening of 07/02/2022.  Patient admitted, seen by orthopedics, underwent right hip fracture repair 07/03/2022.   Assessment & Plan:   Principal Problem:   Hip fracture (HCC) Active Problems:   Essential hypertension   Nocturnal hypoxemia   ESRD (end stage renal disease) on dialysis [facility])   Mixed hyperlipidemia   Depression  #1 right intertrochanteric hip fracture -Secondary to mechanical fall. -Patient seen in consultation by orthopedics and patient subsequently underwent operative fixation per orthopedics 07/03/2022. -PT/OT. -Pain management, DVT prophylaxis per orthopedics.  2.  ESRD on HD MWF -Patient seen in consultation by nephrology on admission and underwent hemodialysis on 07/02/2022 in preparation for operative fixation of right hip fracture 07/03/2022. -Per nephrology.  3.  Hypertension -Continue Bystolic, verapamil.  4.  BPH/remote prostate cancer -Continue tamsulosin.  5.  Hyperlipidemia -Continue statin.  6.  OSA -Not on CPAP. -Knows to wear nocturnal O2 about 2 nights per week.  7.  Mood disorder, dementia -Continue home regimen melatonin, venlafaxine, donezepil, bupropion. -Delirium precautions.   DVT prophylaxis: Per orthopedics Code Status: Full Family Communication: Updated patient.  No family at bedside. Disposition: TBD  Status is:  Inpatient Remains inpatient appropriate because: Severity of illness   Consultants:  Orthopedics: Earney Hamburg, PA 07/02/2022 Orthopedics: Dr. Susa Simmonds 07/03/2022 Nephrology: Dr. Signe Colt 07/02/2022  Procedures:  CT head 07/02/2022 CT C-spine 07/02/2022 Plain films of the right knee 07/02/2022 Chest x-ray 07/02/2022 Plain films of the right femur 07/03/2022 Plain films of the right elbow 07/03/2022 Plain films of the right hip and pelvis 07/02/2022   Antimicrobials:  Anti-infectives (From admission, onward)    Start     Dose/Rate Route Frequency Ordered Stop   07/03/22 1700  ceFAZolin (ANCEF) IVPB 2g/100 mL premix        2 g 200 mL/hr over 30 Minutes Intravenous Every 12 hours 07/03/22 1331 07/05/22 0459   07/03/22 0945  ceFAZolin (ANCEF) IVPB 2g/100 mL premix        2 g 200 mL/hr over 30 Minutes Intravenous On call to O.R. 07/03/22 0931 07/03/22 1050         Subjective: Just returned from the OR.  Alert.  Denies any chest pain or shortness of breath.  No abdominal pain.  Still with some right hip pain however improvement with pain postoperatively.  Objective: Vitals:   07/03/22 1300 07/03/22 1330 07/03/22 1409 07/03/22 1439  BP: 106/64  107/71 112/80  Pulse: 74     Resp: (!) 21     Temp: 98.4 F (36.9 C) 97.8 F (36.6 C)    TempSrc:  Oral    SpO2: 100%     Weight:      Height:        Intake/Output Summary (Last 24 hours) at 07/03/2022 1509 Last data filed at 07/03/2022 1153 Gross per 24 hour  Intake 300 ml  Output 50 ml  Net 250 ml   [employer]  07/02/22 2258 07/03/22 0932  Weight: 84.4 kg 84.4 kg    Examination:  General exam: Appears calm and comfortable  Respiratory system: Clear to auscultation anterior lung fields.Marland Kitchen Respiratory effort normal. Cardiovascular system: S1 & S2 heard, RRR. No JVD, murmurs, rubs, gallops or clicks. No pedal edema. Gastrointestinal system: Abdomen is nondistended, soft and nontender. No organomegaly or masses felt. Normal bowel  sounds heard. Central nervous system: Alert and oriented. No focal neurological deficits. Extremities: Right hip with postop dressing in place.  Skin: No rashes, lesions or ulcers Psychiatry: Judgement and insight appear normal. Mood & affect appropriate.     Data Reviewed: I have personally reviewed following labs and imaging studies  CBC: Recent Labs  Lab 07/02/22 1339 07/02/22 1352 07/03/22 0104  WBC 8.1  --  10.3  NEUTROABS 6.2  --   --   HGB 9.2* 9.5* 8.7*  HCT 28.5* 28.0* 26.5*  MCV 100.0  --  98.9  PLT 187  --  187    Basic Metabolic Panel: Recent Labs  Lab 07/02/22 1339 07/02/22 1352 07/03/22 0104  NA 137 135 135  K 4.0 3.9 3.9  CL 96* 100 95*  CO2 28  --  29  GLUCOSE 96 95 102*  BUN 36* 34* 13  CREATININE 6.41* 7.10* 3.48*  CALCIUM 8.8*  --  8.7*    GFR: Estimated Creatinine Clearance: 21 mL/min (A) (by C-G formula based on SCr of 3.48 mg/dL (H)).  Liver Function Tests: Recent Labs  Lab 07/02/22 1339  AST 13*  ALT 8  ALKPHOS 46  BILITOT 0.5  PROT 6.1*  ALBUMIN 2.8*    CBG: No results for input(s): "GLUCAP" in the last 168 hours.   Recent Results (from the past 240 hour(s))  Surgical pcr screen     Status: None   Collection Time: 07/02/22  5:36 PM   Specimen: Nasal Mucosa; Nasal Swab  Result Value Ref Range Status   MRSA, PCR NEGATIVE NEGATIVE Final   Staphylococcus aureus NEGATIVE NEGATIVE Final    Comment: (NOTE) The Xpert SA Assay (FDA approved for NASAL specimens in patients 46 years of age and older), is one component of a comprehensive surveillance program. It is not intended to diagnose infection nor to guide or monitor treatment. Performed at [facility], [address] [place], [place] [address]          Radiology Studies: DG ELBOW COMPLETE RIGHT (3+VIEW)  Result Date: 07/03/2022 CLINICAL DATA:  Right-sided elbow pain EXAM: RIGHT ELBOW - COMPLETE 4 VIEW COMPARISON:  None Available. FINDINGS: There is a mildly  displaced transverse fracture of the base of the olecranon on lateral view. No additional fracture or dislocation. Preserved joint spaces and bone mineralization. Surgical clips along the soft tissues. IMPRESSION: Minimally displaced transverse fracture of the base of the olecranon. Electronically Signed   By: Karen Kays M.D.   On: 07/03/2022 13:07   DG FEMUR, MIN 2 VIEWS RIGHT  Result Date: 07/03/2022 CLINICAL DATA:  Intertrochanteric femur fracture fixation. EXAM: RIGHT FEMUR 2 VIEWS COMPARISON:  Radiographs 07/02/2022. FINDINGS: C-arm fluoroscopy was provided in the operating room without the presence of a radiologist.44.3 fluoroscopy time. 10.1 mGy air kerma. Six C-arm fluoroscopic images were obtained intraoperatively and are submitted for post operative interpretation. These images demonstrate the placement of a proximal right femoral intramedullary nail and dynamic screw across the intertrochanteric femur fracture. The hardware appears well positioned. There is improved alignment of the fracture, and no complications are identified. Please  see intraoperative findings for further detail. IMPRESSION: Intraoperative fluoroscopic guidance for right femoral fracture fixation. Electronically Signed   By: Carey Bullocks M.D.   On: 07/03/2022 12:52   DG C-Arm 1-60 Min-No Report  Result Date: 07/03/2022 Fluoroscopy was utilized by the requesting physician.  No radiographic interpretation.   DG Chest 1 View  Result Date: 07/02/2022 CLINICAL DATA:  Pain after fall.  History of gastric cancer EXAM: CHEST  1 VIEW COMPARISON:  X-ray 04/21/2022 FINDINGS: Left upper chest port with tip along the central SVC. No consolidation, pneumothorax or effusion. No edema. Normal cardiopericardial silhouette. Film is under penetrated. IMPRESSION: Chest port.  No acute cardiopulmonary disease. Electronically Signed   By: Karen Kays M.D.   On: 07/02/2022 14:07   DG Knee Complete 4 Views Right  Result Date:  07/02/2022 CLINICAL DATA:  Pain after fall EXAM: RIGHT KNEE - COMPLETE 4 VIEW COMPARISON:  None Available. FINDINGS: Osteopenia. Mild joint space loss of the medial compartment. No fracture or dislocation. Preserved bone mineralization. No joint effusion on lateral view. Chondrocalcinosis of the medial compartment. IMPRESSION: Mild degenerative changes of the medial compartment with chondrocalcinosis. Electronically Signed   By: Karen Kays M.D.   On: 07/02/2022 14:05   DG Hip Unilat W or Wo Pelvis 2-3 Views Right  Result Date: 07/02/2022 CLINICAL DATA:  Pain after fall EXAM: DG HIP (WITH OR WITHOUT PELVIS) 3V RIGHT COMPARISON:  None Available. FINDINGS: Mildly displaced oblique fracture involving the intertrochanteric region of the right hip. Osteopenia. No additional fracture or dislocation. Preserved joint spaces. Degenerative changes seen of the lumbar spine at the edge of the imaging field. IMPRESSION: Mildly displaced intertrochanteric right hip fracture Electronically Signed   By: Karen Kays M.D.   On: 07/02/2022 14:05   CT Head Wo Contrast  Result Date: 07/02/2022 CLINICAL DATA:  Head trauma, minor (Age >= 65y); Neck trauma (Age >= 65y) EXAM: CT HEAD WITHOUT CONTRAST CT CERVICAL SPINE WITHOUT CONTRAST TECHNIQUE: Multidetector CT imaging of the head and cervical spine was performed following the standard protocol without intravenous contrast. Multiplanar CT image reconstructions of the cervical spine were also generated. RADIATION DOSE REDUCTION: This exam was performed according to the departmental dose-optimization program which includes automated exposure control, adjustment of the mA and/or kV according to patient size and/or use of iterative reconstruction technique. COMPARISON:  CT Head 02/16/21 FINDINGS: CT HEAD FINDINGS Brain: No evidence of acute infarction, hemorrhage, hydrocephalus, extra-axial collection or mass lesion/mass effect. Mineralization of the basal ganglia bilaterally. There is  a chronic appearing infarct in the centrum semiovale on the right Vascular: No hyperdense vessel or unexpected calcification. Skull: Normal. Negative for fracture or focal lesion. Sinuses/Orbits: No middle ear or mastoid effusion. Paranasal sinuses are notable for mucosal thickening in the left maxillary sinus and bilateral sphenoid sinuses. Orbits are unremarkable. Other: None. CT CERVICAL SPINE FINDINGS Alignment: Normal. Skull base and vertebrae: No acute fracture. No primary bone lesion or focal pathologic process. Soft tissues and spinal canal: No prevertebral fluid or swelling. No visible canal hematoma. Disc levels:  No evidence of high-grade spinal canal stenosis. Upper chest: Incompletely imaged. Other: No IMPRESSION: 1. No acute intracranial abnormality. 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 2. No acute fracture or traumatic malalignment of the cervical spine. Electronically Signed   By: Lorenza Cambridge M.D.   On: 07/02/2022 13:55   CT Cervical Spine Wo Contrast  Result Date: 07/02/2022 CLINICAL DATA:  Head trauma, minor (Age >= 65y); Neck trauma (Age >=  65y) EXAM: CT HEAD WITHOUT CONTRAST CT CERVICAL SPINE WITHOUT CONTRAST TECHNIQUE: Multidetector CT imaging of the head and cervical spine was performed following the standard protocol without intravenous contrast. Multiplanar CT image reconstructions of the cervical spine were also generated. RADIATION DOSE REDUCTION: This exam was performed according to the departmental dose-optimization program which includes automated exposure control, adjustment of the mA and/or kV according to patient size and/or use of iterative reconstruction technique. COMPARISON:  CT Head 02/16/21 FINDINGS: CT HEAD FINDINGS Brain: No evidence of acute infarction, hemorrhage, hydrocephalus, extra-axial collection or mass lesion/mass effect. Mineralization of the basal ganglia bilaterally. There is a chronic appearing infarct in the centrum semiovale on the right  Vascular: No hyperdense vessel or unexpected calcification. Skull: Normal. Negative for fracture or focal lesion. Sinuses/Orbits: No middle ear or mastoid effusion. Paranasal sinuses are notable for mucosal thickening in the left maxillary sinus and bilateral sphenoid sinuses. Orbits are unremarkable. Other: None. CT CERVICAL SPINE FINDINGS Alignment: Normal. Skull base and vertebrae: No acute fracture. No primary bone lesion or focal pathologic process. Soft tissues and spinal canal: No prevertebral fluid or swelling. No visible canal hematoma. Disc levels:  No evidence of high-grade spinal canal stenosis. Upper chest: Incompletely imaged. Other: No IMPRESSION: 1. No acute intracranial abnormality. 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 2. No acute fracture or traumatic malalignment of the cervical spine. Electronically Signed   By: Lorenza Cambridge M.D.   On: 07/02/2022 13:55        Scheduled Meds:  allopurinol  150 mg Oral Daily   aspirin EC  81 mg Oral Daily   buPROPion  300 mg Oral Daily   [START ON 07/04/2022] calcitRIOL  0.5 mcg Oral Q M,W,F-HD   Chlorhexidine Gluconate Cloth  6 each Topical Q0600   cinacalcet  30 mg Oral Q M,W,F-1800   docusate sodium  100 mg Oral BID   donepezil  5 mg Oral QHS   fentaNYL       loratadine  10 mg Oral Daily   melatonin  10 mg Oral QHS   [START ON 07/04/2022] multivitamin  1 tablet Oral QHS   nebivolol  20 mg Oral QHS   pantoprazole  40 mg Oral QAC supper   polyethylene glycol  17 g Oral BID   rosuvastatin  5 mg Oral Daily   [START ON 07/04/2022] tamsulosin  0.4 mg Oral Daily   venlafaxine XR  75 mg Oral Daily   [START ON 07/04/2022] verapamil  80 mg Oral TID   Continuous Infusions:   ceFAZolin (ANCEF) IV     methocarbamol (ROBAXIN) IV       LOS: 1 day    Time spent: 35 minutes    Ramiro Harvest, MD [facility]   To contact the attending provider between 7A-7P or the covering provider during after hours 7P-7A,  please log into the web site [email or web address] and access using universal [place] password for that web site. If you do not have the password, please call the hospital operator.  07/03/2022, 3:09 PM

## 2022-07-03 NOTE — Plan of Care (Signed)

## 2022-07-03 NOTE — Progress Notes (Signed)
Initial Nutrition Assessment  DOCUMENTATION CODES:   Not applicable  INTERVENTION:  Once diet resumes, recommend: Regular diet for optimal PO intake Nepro Shake po TID, each supplement provides 425 kcal and 19 grams protein Renal MVI with minerals daily  NUTRITION DIAGNOSIS:   Increased nutrient needs related to post-op healing, hip fracture as evidenced by estimated needs.  GOAL:   Patient will meet greater than or equal to 90% of their needs   MONITOR:   Diet advancement, Labs, Weight trends, Skin, I & O's  REASON FOR ASSESSMENT:   Consult Hip fracture protocol  ASSESSMENT:   Pt on his way to HD when he fell leading to R intertrochanteric hip fracture. PMH significant for ESRD on HD, HTN, HLD, prostate Ca s/p XRT.   Pt is NPO in the OR for surgical repair of R hip.   Unable to assess nutrition related history at this time.   EDW 86 kg Current weight 84.4 kg  Reviewed documented weight history. Pt's weight has been trending down. Noted a weight loss of ~20% within the last year (06/302/23-06/20/24).   Given significant weight loss, pt being under dry weight and now with hip fracture, he would benefit from addition of nutrition supplements and liberal diet to optimize PO intake and support post-op healing.   Medications: calcitriol, sensipar, colace, melatonin, protonix, miralax  Labs: Cr 3.48, GFR 17  NUTRITION - FOCUSED PHYSICAL EXAM: Pt off unit for R hip repair.   Diet Order:   Diet Order             Diet NPO time specified Except for: Sips with Meds  Diet effective now                   EDUCATION NEEDS:   Not appropriate for education at this time  Skin:  Skin Assessment: Reviewed RN Assessment  Last BM:  6/18  Height:   Ht Readings from Last 1 Encounters:  07/03/22 6\' 2"  (1.88 m)    Weight:   Wt Readings from Last 1 Encounters:  07/03/22 84.4 kg   BMI:  Body mass index is 23.89 kg/m.  Estimated Nutritional Needs:   Kcal:   2200-2400  Protein:  110-125g  Fluid:  1L + UOP  Drusilla Kanner, RDN, LDN Clinical Nutrition

## 2022-07-03 NOTE — Transfer of Care (Signed)
Immediate Anesthesia Transfer of Care Note  Patient: Jacob [name]  Procedure(s) Performed: OPEN TREATMENT RIGHT HIP FRACTURE (Right: Hip)  Patient Location: PACU  Anesthesia Type:General  Level of Consciousness: drowsy  Airway & Oxygen Therapy: Patient Spontanous Breathing  Post-op Assessment: Report given to RN and Post -op Vital signs reviewed and stable  Post vital signs: Reviewed and stable  Last Vitals:  Vitals Value Taken Time  BP 113/65 07/03/22 1151  Temp 36.9 C 07/03/22 1151  Pulse 75 07/03/22 1153  Resp 14 07/03/22 1153  SpO2 96 % 07/03/22 1153  Vitals shown include unvalidated device data.  Last Pain:  Vitals:   07/02/22 2258  TempSrc: Oral  PainSc: 9          Complications: No notable events documented.

## 2022-07-03 NOTE — Consult Note (Signed)
Reason for Consult: Right intertrochanteric hip fracture Referring Physician: [facility] department  Jacob [name] is an 77 y.o. male.  HPI: Patient was admitted to the hospital service after being evaluated in the emergency department found to have a right intertrochanteric hip fracture.  He had a fall at home and was unable to bear weight afterward.  He was brought in and evaluated.  He complains of pain in the right hip.  He complains of pain in the right elbow.  He has swelling in both areas.  He does have intact active motion of the elbow but some pain with range of motion.  Denies numbness or tingling.  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, has had transfusion   Anxiety and depression    Arthritis    gout   Back pain    BPH with obstruction/lower urinary tract symptoms    Chronic kidney disease    Stage 5 kidney   Constipation    ED (erectile dysfunction)    GERD (gastroesophageal reflux disease)    Hypercholesterolemia    Hypertension    Night sweats    Post-operative nausea and vomiting 04/09/2016   Prostate cancer (HCC) 08/14/2011   Adenocarcinoma,gleason:3+3=6,& 3+4=7,PSA=5.66, s/p radiation therapy   PUD (peptic ulcer disease)    Sleep apnea    does not use Cpap but occasionally uses O2 @ 2L qhs   Stomach cancer (HCC)    Ulcer    peptic ulcer hx    Past Surgical History:  Procedure Laterality Date   AV FISTULA PLACEMENT Right 02/19/2021   Procedure: ARTERIOVENOUS (AV) FISTULA CREATION;  Surgeon: Victorino Sparrow, MD;  Location: [facility] OR;  Service: Vascular;  Laterality: Right;   AV FISTULA PLACEMENT Right 04/21/2022   Procedure: INSERTION OF RIGHT ARM ARTERIOVENOUS (AV) GORE-TEX GRAFT;  Surgeon: Victorino Sparrow, MD;  Location: [facility];  Service: Vascular;  Laterality: Right;   BASCILIC VEIN TRANSPOSITION Right 07/23/2021   Procedure: RIGHT ARM BASILIC VEIN TRANSPOSITION;  Surgeon: Victorino Sparrow, MD;   Location: [facility] OR;  Service: Vascular;  Laterality: Right;  PERIPHERAL NERVE BLOCK   colon polyps bx  11/24/06   colon,transverse and rectosigmoid polyps:tubular adenomas and hyperplastic polyps,no high grade dysplasia or malignancy    COLONOSCOPY WITH PROPOFOL N/A 08/24/2019   Procedure: COLONOSCOPY WITH PROPOFOL;  Surgeon: Shellia Cleverly, DO;  Location: [facility] ENDOSCOPY;  Service: Gastroenterology;  Laterality: N/A;   duodenal bx  11/24/06   benign   ESOPHAGOGASTRODUODENOSCOPY N/A 10/27/2015   Procedure: ESOPHAGOGASTRODUODENOSCOPY (EGD);  Surgeon: Iva Boop, MD;  Location: [facility];  Service: Endoscopy;  Laterality: N/A;   EUS N/A 11/08/2015   Procedure: UPPER ENDOSCOPIC ULTRASOUND (EUS) RADIAL;  Surgeon: Rachael Fee, MD;  Location: [facility];  Service: Endoscopy;  Laterality: N/A;   GASTRECTOMY N/A 03/25/2016   Procedure: DISTAL GASTRECTOMY;  Surgeon: Almond Lint, MD;  Location: [facility] OR;  Service: General;  Laterality: N/A;   gastric bx  11/24/06   chronic active gastritis,with metaplasia and focal changaes of xanthelasma   GASTROSTOMY N/A 03/25/2016   Procedure: INSERTION OF FEEDING TUBE;  Surgeon: Almond Lint, MD;  Location: [facility] OR;  Service: General;  Laterality: N/A;   INSERTION OF DIALYSIS CATHETER Left 04/21/2022   Procedure: INSERTION OF TUNNELED DIALYSIS CATHETER, LEFT INTERNAL JUGULAR;  Surgeon: Victorino Sparrow, MD;  Location: [facility] OR;  Service: Vascular;  Laterality: Left;   INSERTION PROSTATE RADIATION SEED  12-29-11   IR GENERIC HISTORICAL  04/01/2016   IR GJ TUBE CHANGE 04/01/2016 Richarda Overlie, MD [facility]   IR GENERIC HISTORICAL  04/05/2016   IR GJ TUBE CHANGE 04/05/2016 Richarda Overlie, MD [facility]   LAPAROSCOPIC GASTRECTOMY  03/25/2016   Diagnostic laparoscopy, distal gastrectomy with Billroth 2 reconstruction and gastrojejunostomy tube   LAPAROSCOPY N/A 03/25/2016   Procedure: DIAGNOSTIC LAPAROSCOPY;  Surgeon: Almond Lint, MD;  Location: [facility] OR;  Service: General;   Laterality: N/A;   POLYPECTOMY  08/24/2019   Procedure: POLYPECTOMY;  Surgeon: Shellia Cleverly, DO;  Location: [facility];  Service: Gastroenterology;;   PORTACATH PLACEMENT Left 12/04/2015   Procedure: INSERTION PORT-A-CATH;  Surgeon: Almond Lint, MD;  Location: [facility] OR;  Service: General;  Laterality: Left;   PROSTATE BIOPSY  08/14/11   Adenocarcinoma/volume=58.51cc,gleason=3+3=6 & 3+4=7   TONSILLECTOMY     77 years old    Family History  Problem Relation Age of Onset   Cancer Mother        NOS   Alcohol abuse Father    Alcohol abuse Brother 8   Cancer Paternal Aunt        NOS   Colon cancer Neg Hx     Social History:  reports that he quit smoking about 22 years ago. His smoking use included cigarettes. He has a 45.00 pack-year smoking history. He has been exposed to tobacco smoke. He has never used smokeless tobacco. He reports that he does not currently use alcohol after a past usage of about 1.0 standard drink of alcohol per week. He reports that he does not use drugs.  Allergies:  Allergies  Allergen Reactions   Lisinopril Cough    Medications: I have reviewed the patient's current medications.  Results for orders placed or performed during the hospital encounter of 07/02/22 (from the past 48 hour(s))  CBC with Differential     Status: Abnormal   Collection Time: 07/02/22  1:39 PM  Result Value Ref Range   WBC 8.1 4.0 - 10.5 K/uL   RBC 2.85 (L) 4.22 - 5.81 MIL/uL   Hemoglobin 9.2 (L) 13.0 - 17.0 g/dL   HCT 16.1 (L) 09.6 - 04.5 %   MCV 100.0 80.0 - 100.0 fL   MCH 32.3 26.0 - 34.0 pg   MCHC 32.3 30.0 - 36.0 g/dL   RDW 40.9 81.1 - 91.4 %   Platelets 187 150 - 400 K/uL   nRBC 0.0 0.0 - 0.2 %   Neutrophils Relative % 77 %   Neutro Abs 6.2 1.7 - 7.7 K/uL   Lymphocytes Relative 12 %   Lymphs Abs 1.0 0.7 - 4.0 K/uL   Monocytes Relative 7 %   Monocytes Absolute 0.6 0.1 - 1.0 K/uL   Eosinophils Relative 3 %   Eosinophils Absolute 0.3 0.0 - 0.5 K/uL   Basophils  Relative 1 %   Basophils Absolute 0.0 0.0 - 0.1 K/uL   Immature Granulocytes 0 %   Abs Immature Granulocytes 0.03 0.00 - 0.07 K/uL    Comment: Performed at [facility], [address] [place], [place] [address]  Comprehensive metabolic panel     Status: Abnormal   Collection Time: 07/02/22  1:39 PM  Result Value Ref Range   Sodium 137 135 - 145 mmol/L   Potassium 4.0 3.5 - 5.1 mmol/L   Chloride 96 (L) 98 - 111 mmol/L   CO2 28 22 - 32 mmol/L   Glucose, Bld 96 70 - 99 mg/dL  Comment: Glucose reference range applies only to samples taken after fasting for at least 8 hours.   BUN 36 (H) 8 - 23 mg/dL   Creatinine, Ser 1.61 (H) 0.61 - 1.24 mg/dL   Calcium 8.8 (L) 8.9 - 10.3 mg/dL   Total Protein 6.1 (L) 6.5 - 8.1 g/dL   Albumin 2.8 (L) 3.5 - 5.0 g/dL   AST 13 (L) 15 - 41 U/L   ALT 8 0 - 44 U/L   Alkaline Phosphatase 46 38 - 126 U/L   Total Bilirubin 0.5 0.3 - 1.2 mg/dL   GFR, Estimated 8 (L) >60 mL/min    Comment: (NOTE) Calculated using the CKD-EPI Creatinine Equation (2021)    Anion gap 13 5 - 15    Comment: Performed at [facility], [address], [place], [place] [address]  I-stat chem 8, ED (not at [facility], [facility] or [facility])     Status: Abnormal   Collection Time: 07/02/22  1:52 PM  Result Value Ref Range   Sodium 135 135 - 145 mmol/L   Potassium 3.9 3.5 - 5.1 mmol/L   Chloride 100 98 - 111 mmol/L   BUN 34 (H) 8 - 23 mg/dL   Creatinine, Ser 5.40 (H) 0.61 - 1.24 mg/dL   Glucose, Bld 95 70 - 99 mg/dL    Comment: Glucose reference range applies only to samples taken after fasting for at least 8 hours.   Calcium, Ion 0.88 (LL) 1.15 - 1.40 mmol/L   TCO2 31 22 - 32 mmol/L   Hemoglobin 9.5 (L) 13.0 - 17.0 g/dL   HCT 98.1 (L) 19.1 - 47.8 %   Comment NOTIFIED PHYSICIAN   Hepatitis B surface antigen     Status: None   Collection Time: 07/02/22  3:11 PM  Result Value Ref Range   Hepatitis B Surface Ag NON REACTIVE NON REACTIVE    Comment: Performed at [facility]  [facility], [address], [place], [place] [address]  Hepatitis B surface antibody,quantitative     Status: Abnormal   Collection Time: 07/02/22  3:11 PM  Result Value Ref Range   Hep B S AB Quant (Post) <3.5 (L) Immunity>9.9 mIU/mL    Comment: (NOTE)  Status of Immunity                     Anti-HBs Level  ------------------                     -------------- Inconsistent with Immunity                   0.0 - 9.9 Consistent with Immunity                          >9.9 **Effective July 14, 2022 the reference interval**  will be changing to: Immunity >10 Performed At: [facility] [address] [place], [place] [id number] Jolene Schimke MD ON:[id number]   Surgical pcr screen     Status: None   Collection Time: 07/02/22  5:36 PM   Specimen: Nasal Mucosa; Nasal Swab  Result Value Ref Range   MRSA, PCR NEGATIVE NEGATIVE   Staphylococcus aureus NEGATIVE NEGATIVE    Comment: (NOTE) The Xpert SA Assay (FDA approved for NASAL specimens in patients 58 years of age and older), is one component of a comprehensive surveillance program. It is not intended to diagnose infection nor to guide or monitor treatment. Performed at [facility], [address], [place], [place] [address]   Type and screen [facility]     Status: None   Collection Time: 07/02/22 10:51 PM  Result Value Ref Range   ABO/RH(D) O POS    Antibody Screen NEG    Sample Expiration      07/05/2022,2359 Performed at [facility], [address], [place], [place] [address]   CBC     Status: Abnormal   Collection Time: 07/03/22  1:04 AM  Result Value Ref Range   WBC 10.3 4.0 - 10.5 K/uL   RBC 2.68 (L) 4.22 - 5.81 MIL/uL   Hemoglobin 8.7 (L) 13.0 - 17.0 g/dL   HCT 09.8 (L) 11.9 - 14.7 %   MCV 98.9 80.0 - 100.0 fL   MCH 32.5 26.0 - 34.0 pg   MCHC 32.8 30.0 - 36.0 g/dL   RDW 82.9 56.2 - 13.0 %   Platelets 187 150 - 400 K/uL   nRBC 0.0 0.0 - 0.2 %    Comment: Performed at [facility]  [facility], [address] [place], [place] [address]  Basic metabolic panel     Status: Abnormal   Collection Time: 07/03/22  1:04 AM  Result Value Ref Range   Sodium 135 135 - 145 mmol/L   Potassium 3.9 3.5 - 5.1 mmol/L   Chloride 95 (L) 98 - 111 mmol/L   CO2 29 22 - 32 mmol/L   Glucose, Bld 102 (H) 70 - 99 mg/dL    Comment: Glucose reference range applies only to samples taken after fasting for at least 8 hours.   BUN 13 8 - 23 mg/dL   Creatinine, Ser 4.69 (H) 0.61 - 1.24 mg/dL   Calcium 8.7 (L) 8.9 - 10.3 mg/dL   GFR, Estimated 17 (L) >60 mL/min    Comment: (NOTE) Calculated using the CKD-EPI Creatinine Equation (2021)    Anion gap 11 5 - 15    Comment: Performed at [facility], [address] [place], [place] [address]    DG Chest 1 View  Result Date: 07/02/2022 CLINICAL DATA:  Pain after fall.  History of gastric cancer EXAM: CHEST  1 VIEW COMPARISON:  X-ray 04/21/2022 FINDINGS: Left upper chest port with tip along the central SVC. No consolidation, pneumothorax or effusion. No edema. Normal cardiopericardial silhouette. Film is under penetrated. IMPRESSION: Chest port.  No acute cardiopulmonary disease. Electronically Signed   By: Karen Kays M.D.   On: 07/02/2022 14:07   DG Knee Complete 4 Views Right  Result Date: 07/02/2022 CLINICAL DATA:  Pain after fall EXAM: RIGHT KNEE - COMPLETE 4 VIEW COMPARISON:  None Available. FINDINGS: Osteopenia. Mild joint space loss of the medial compartment. No fracture or dislocation. Preserved bone mineralization. No joint effusion on lateral view. Chondrocalcinosis of the medial compartment. IMPRESSION: Mild degenerative changes of the medial compartment with chondrocalcinosis. Electronically Signed   By: Karen Kays M.D.   On: 07/02/2022 14:05   DG Hip Unilat W or Wo Pelvis 2-3 Views Right  Result Date: 07/02/2022 CLINICAL DATA:  Pain after fall EXAM: DG HIP (WITH OR WITHOUT PELVIS) 3V RIGHT COMPARISON:  None Available. FINDINGS: Mildly  displaced oblique fracture involving the intertrochanteric region of the right hip. Osteopenia. No additional fracture or dislocation. Preserved joint spaces. Degenerative changes seen of the lumbar spine at the edge of the imaging field. IMPRESSION: Mildly displaced intertrochanteric right hip fracture Electronically Signed   By: Piedad Climes.D.  On: 07/02/2022 14:05   CT Head Wo Contrast  Result Date: 07/02/2022 CLINICAL DATA:  Head trauma, minor (Age >= 65y); Neck trauma (Age >= 65y) EXAM: CT HEAD WITHOUT CONTRAST CT CERVICAL SPINE WITHOUT CONTRAST TECHNIQUE: Multidetector CT imaging of the head and cervical spine was performed following the standard protocol without intravenous contrast. Multiplanar CT image reconstructions of the cervical spine were also generated. RADIATION DOSE REDUCTION: This exam was performed according to the departmental dose-optimization program which includes automated exposure control, adjustment of the mA and/or kV according to patient size and/or use of iterative reconstruction technique. COMPARISON:  CT Head 02/16/21 FINDINGS: CT HEAD FINDINGS Brain: No evidence of acute infarction, hemorrhage, hydrocephalus, extra-axial collection or mass lesion/mass effect. Mineralization of the basal ganglia bilaterally. There is a chronic appearing infarct in the centrum semiovale on the right Vascular: No hyperdense vessel or unexpected calcification. Skull: Normal. Negative for fracture or focal lesion. Sinuses/Orbits: No middle ear or mastoid effusion. Paranasal sinuses are notable for mucosal thickening in the left maxillary sinus and bilateral sphenoid sinuses. Orbits are unremarkable. Other: None. CT CERVICAL SPINE FINDINGS Alignment: Normal. Skull base and vertebrae: No acute fracture. No primary bone lesion or focal pathologic process. Soft tissues and spinal canal: No prevertebral fluid or swelling. No visible canal hematoma. Disc levels:  No evidence of high-grade spinal canal  stenosis. Upper chest: Incompletely imaged. Other: No IMPRESSION: 1. No acute intracranial abnormality. 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 2. No acute fracture or traumatic malalignment of the cervical spine. Electronically Signed   By: Lorenza Cambridge M.D.   On: 07/02/2022 13:55   CT Cervical Spine Wo Contrast  Result Date: 07/02/2022 CLINICAL DATA:  Head trauma, minor (Age >= 65y); Neck trauma (Age >= 65y) EXAM: CT HEAD WITHOUT CONTRAST CT CERVICAL SPINE WITHOUT CONTRAST TECHNIQUE: Multidetector CT imaging of the head and cervical spine was performed following the standard protocol without intravenous contrast. Multiplanar CT image reconstructions of the cervical spine were also generated. RADIATION DOSE REDUCTION: This exam was performed according to the departmental dose-optimization program which includes automated exposure control, adjustment of the mA and/or kV according to patient size and/or use of iterative reconstruction technique. COMPARISON:  CT Head 02/16/21 FINDINGS: CT HEAD FINDINGS Brain: No evidence of acute infarction, hemorrhage, hydrocephalus, extra-axial collection or mass lesion/mass effect. Mineralization of the basal ganglia bilaterally. There is a chronic appearing infarct in the centrum semiovale on the right Vascular: No hyperdense vessel or unexpected calcification. Skull: Normal. Negative for fracture or focal lesion. Sinuses/Orbits: No middle ear or mastoid effusion. Paranasal sinuses are notable for mucosal thickening in the left maxillary sinus and bilateral sphenoid sinuses. Orbits are unremarkable. Other: None. CT CERVICAL SPINE FINDINGS Alignment: Normal. Skull base and vertebrae: No acute fracture. No primary bone lesion or focal pathologic process. Soft tissues and spinal canal: No prevertebral fluid or swelling. No visible canal hematoma. Disc levels:  No evidence of high-grade spinal canal stenosis. Upper chest: Incompletely imaged. Other: No IMPRESSION:  1. No acute intracranial abnormality. 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 2. No acute fracture or traumatic malalignment of the cervical spine. Electronically Signed   By: Lorenza Cambridge M.D.   On: 07/02/2022 13:55    Review of Systems  Constitutional: Negative.   HENT: Negative.    Respiratory: Negative.    Musculoskeletal:  Positive for arthralgias.  All other systems reviewed and are negative.  Blood pressure 104/63, pulse 74, temperature 97.9 F (36.6 C), resp. rate 18,  height 6\' 2"  (1.88 m), weight 84.4 kg, SpO2 98 %. Physical Exam HENT:     Head: Atraumatic.     Mouth/Throat:     Mouth: Mucous membranes are moist.  Eyes:     Extraocular Movements: Extraocular movements intact.  Cardiovascular:     Rate and Rhythm: Normal rate.  Pulmonary:     Effort: Pulmonary effort is normal.  Abdominal:     General: Abdomen is flat.  Musculoskeletal:     Cervical back: Neck supple.     Comments: Tender to palpation about the right hip.  Did not assess for range of motion but the hip is slightly externally rotated.  No tenderness distally along the thigh, knee, leg or ankle.  He does have some right elbow pain to palpation and mild swelling there.  Some pain with range of motion of the elbow.  Full digital motion bilaterally.  Skin:    General: Skin is warm.  Neurological:     General: No focal deficit present.     Mental Status: He is alert.  Psychiatric:        Mood and Affect: Mood normal.     Assessment/Plan: Patient has a right intertrochanteric hip fracture that is amenable to operative fixation.  He also has pain in the right elbow but I do not see any elbow films.  We will get some of those postoperatively.  For now he is on bedrest.  N.p.o. for surgery.    Terance Hart 07/03/2022, 9:41 AM

## 2022-07-03 NOTE — Anesthesia Procedure Notes (Signed)
Procedure Name: Intubation Date/Time: 07/03/2022 10:41 AM  Performed by: Orlin Hilding, CRNAPre-anesthesia Checklist: Patient identified, Emergency Drugs available, Suction available, Patient being monitored and Timeout performed Patient Re-evaluated:Patient Re-evaluated prior to induction Oxygen Delivery Method: Circle system utilized Preoxygenation: Pre-oxygenation with 100% oxygen Induction Type: IV induction Ventilation: Mask ventilation without difficulty and Oral airway inserted - appropriate to patient size Laryngoscope Size: Mac and 4 Grade View: Grade I Tube type: Oral Tube size: 7.0 mm Number of attempts: 1 Placement Confirmation: ETT inserted through vocal cords under direct vision, positive ETCO2 and breath sounds checked- equal and bilateral Secured at: 23 cm Tube secured with: Tape Dental Injury: Teeth and Oropharynx as per pre-operative assessment

## 2022-07-03 NOTE — Anesthesia Preprocedure Evaluation (Signed)
Anesthesia Evaluation  Patient identified by MRN, date of birth, ID band Patient awake    Reviewed: Allergy & Precautions, H&P , NPO status , Patient's Chart, lab work & pertinent test results  History of Anesthesia Complications (+) PONV and history of anesthetic complications  Airway Mallampati: II   Neck ROM: full    Dental   Pulmonary sleep apnea , former smoker   breath sounds clear to auscultation       Cardiovascular hypertension,  Rhythm:regular Rate:Normal     Neuro/Psych  PSYCHIATRIC DISORDERS Anxiety Depression    CVA    GI/Hepatic PUD,GERD  ,,  Endo/Other    Renal/GU Renal InsufficiencyRenal disease     Musculoskeletal  (+) Arthritis ,    Abdominal   Peds  Hematology  (+) Blood dyscrasia, anemia   Anesthesia Other Findings   Reproductive/Obstetrics                             Anesthesia Physical Anesthesia Plan  ASA: 3  Anesthesia Plan: General   Post-op Pain Management:    Induction: Intravenous  PONV Risk Score and Plan: 3 and Ondansetron, Dexamethasone, Midazolam and Treatment may vary due to age or medical condition  Airway Management Planned: Oral ETT  Additional Equipment:   Intra-op Plan:   Post-operative Plan: Extubation in OR  Informed Consent: I have reviewed the patients History and Physical, chart, labs and discussed the procedure including the risks, benefits and alternatives for the proposed anesthesia with the patient or authorized representative who has indicated his/her understanding and acceptance.     Dental advisory given  Plan Discussed with: CRNA, Anesthesiologist and Surgeon  Anesthesia Plan Comments:        Anesthesia Quick Evaluation

## 2022-07-03 NOTE — Progress Notes (Signed)
Pt receives out-pt HD at [facility] on MWF. Pt arrives at 11:10 for 11:30 chair time. Received a message from pt's out-pt clinic with request that case management staff meet with pt/family to provide assistance with transportation options to/from HD at d/c. Contacted TOC staff this am with clinic's request. Will assist as needed.   Olivia Canter Renal Navigator [phone]

## 2022-07-03 NOTE — TOC Initial Note (Signed)
Transition of Care [facility]) - Initial/Assessment Note    Patient Details  Name: Jacob [name] MRN: [id number] Date of Birth: 06-26-1945  Transition of [facility]) CM/SW Contact:    Lorri Frederick, LCSW Phone Number: 07/03/2022, 2:01 PM  Clinical Narrative:    CSW requested to follow up on transportation issues related to HD.  [facility] arrive at 1110 for 1130 chair time.  CSW called [facility] and they confirm that pt is all set up for transport in the system but has not yet scheduled any rides.  Needs to call 7 days in advance initially, can get on standing schedule at some point.  CSW spoke with pt in room, he was not aware that he was set up for rides. Pt reports his son has transported him when he is not at work and pt has driven himself recently as well. Discussed [facility], provided number to schedule rides and also placed on AVS.  CSW attempted to call son regarding this also, left message.  Pt is from home with wife.  Reports he is seen at [facility]  Reports he has HH PT through the [place] currently.  Permission given to speak with wife and both sons.  TOC will continue to follow for DC needs.                   Barriers to Discharge: Continued Medical Work up   Patient Goals and CMS Choice Patient states their goals for this hospitalization and ongoing recovery are:: get back to normal          Expected Discharge Plan and Services In-house Referral: Clinical Social Work     Living arrangements for the past 2 months: Single Family Home                                      Prior Living Arrangements/Services Living arrangements for the past 2 months: Single Family Home Lives with:: Spouse Patient language and need for interpreter reviewed:: Yes Do you feel safe going back to the place where you live?: Yes      Need for Family Participation in Patient Care: Yes (Comment) Care giver support system in place?: Yes (comment) Current  home services: Home PT (through [facility]) Criminal Activity/Legal Involvement Pertinent to Current Situation/Hospitalization: No - Comment as needed  Activities of Daily Living Home Assistive Devices/Equipment: Environmental consultant (specify type) ADL Screening (condition at time of admission) Patient's cognitive ability adequate to safely complete daily activities?: Yes Is the patient deaf or have difficulty hearing?: No Does the patient have difficulty seeing, even when wearing glasses/contacts?: No Does the patient have difficulty concentrating, remembering, or making decisions?: No Patient able to express need for assistance with ADLs?: Yes Does the patient have difficulty dressing or bathing?: No Independently performs ADLs?: Yes (appropriate for developmental age) Does the patient have difficulty walking or climbing stairs?: Yes Weakness of Legs: Right Weakness of Arms/Hands: Right  Permission Sought/Granted Permission sought to share information with : Family Supports Permission granted to share information with : Yes, Verbal Permission Granted  Share Information with NAME: wife Talbert Forest, son Francee Piccolo and Mellody Dance           Emotional Assessment Appearance:: Appears stated age Attitude/Demeanor/Rapport: Engaged Affect (typically observed): Appropriate, Pleasant Orientation: : Oriented to Place, Oriented to Self, Oriented to  Time, Oriented to Situation  Admission diagnosis:  ESRD (end stage renal disease) (HCC) [N18.6] Fall, initial encounter [W19.XXXA] Closed displaced intertrochanteric fracture of right femur, initial encounter (HCC) [S72.141A] Hip fracture (HCC) [S72.009A] Patient Active Problem List   Diagnosis Date Noted   Hip fracture (HCC) 07/02/2022   Primary open angle glaucoma 11/18/2021   Venous (peripheral) insufficiency 11/18/2021   Mixed hyperlipidemia 03/28/2021   Stroke (HCC) 02/16/2021   Abnormal stress test 01/10/2021   Obesity (BMI 30-39.9) 01/10/2021    Other chest pain 11/23/2020   Hypertension 11/23/2020   Murmur 11/23/2020   Dizziness 11/23/2020   SOB (shortness of breath) 11/23/2020   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   Stooped posture 08/13/2020   Multiple myeloma (HCC) 08/13/2020   Depression 05/27/2020   Nausea 05/27/2020   Pain 05/27/2020   Hyperkalemia 12/20/2019   History of stomach cancer 12/20/2019   Metabolic acidosis 12/20/2019   Hypoglycemia due to insulin 12/20/2019   Hypertensive urgency 12/20/2019   Personal history of colonic polyps    Diverticulosis of colon without hemorrhage    Multiple polyps of sigmoid colon    AKI (acute kidney injury) (HCC) 09/15/2018   ESRD (end stage renal disease) on dialysis (HCC) 09/15/2018   Pneumonia 09/09/2018   Acute renal failure superimposed on stage 3 chronic kidney disease (HCC) 09/09/2018   SBO (small bowel obstruction) (HCC) 09/09/2018   Port-A-Cath in place 11/23/2017   Post-operative nausea and vomiting 04/09/2016   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   Genetic testing 12/19/2015   Stomach cancer (HCC)    Gastric adenocarcinoma (HCC)    Gastric mass    GI bleed 10/26/2015   Gastrointestinal hemorrhage    PUD (peptic ulcer disease)    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   Nocturnal hypoxemia 11/04/2013   Noncompliance with treatment 09/02/2013   Chronic kidney disease, stage II (mild) 08/30/2013   Morbid obesity (HCC) 07/26/2013   Fatigue 07/26/2013   Prostate cancer (HCC) 08/14/2011   Iron deficiency anemia 03/19/2007   DEPRESSION 03/19/2007   Essential hypertension 03/19/2007   NEOPLASM, BENIGN, STOMACH 11/23/2006   POLYP, COLON 11/23/2006   Grade I internal hemorrhoids 11/23/2006   GASTRITIS 11/23/2006   PCP:  Ellyn Hack, MD Pharmacy:   [facility] [id number] - [place], [place] - [address] [place] [address] Phone: [phone] Fax:  [phone]  [facility] - [place], [place] - [address] [place] [address] Phone: [phone] Fax: [phone]  [facility] [place] - [address] [place] [address] Phone: [phone] Fax: [phone]     Social Determinants of Health (SDOH) Social History: SDOH Screenings   Food Insecurity: No Food Insecurity (07/02/2022)  Housing: Low Risk  (07/02/2022)  Transportation Needs: No Transportation Needs (07/02/2022)  Utilities: Not At Risk (07/02/2022)  Tobacco Use: Medium Risk (07/03/2022)   SDOH Interventions:     Readmission Risk Interventions    12/22/2019    9:46 AM  Readmission Risk Prevention Plan  Transportation Screening Complete  PCP or Specialist Appt within 3-5 Days Not Complete  Not Complete comments first available appt on 12/23  HRI or Home Care Consult Complete  Social Work Consult for Recovery Care Planning/Counseling Complete  Palliative Care Screening Not Applicable

## 2022-07-04 ENCOUNTER — Inpatient Hospital Stay (HOSPITAL_COMMUNITY): Payer: Medicare Other

## 2022-07-04 DIAGNOSIS — N186 End stage renal disease: Secondary | ICD-10-CM

## 2022-07-04 DIAGNOSIS — S52021P Displaced fracture of olecranon process without intraarticular extension of right ulna, subsequent encounter for closed fracture with malunion: Secondary | ICD-10-CM

## 2022-07-04 DIAGNOSIS — I10 Essential (primary) hypertension: Secondary | ICD-10-CM | POA: Diagnosis not present

## 2022-07-04 DIAGNOSIS — F32A Depression, unspecified: Secondary | ICD-10-CM | POA: Diagnosis not present

## 2022-07-04 DIAGNOSIS — S72001P Fracture of unspecified part of neck of right femur, subsequent encounter for closed fracture with malunion: Secondary | ICD-10-CM | POA: Diagnosis not present

## 2022-07-04 DIAGNOSIS — N400 Enlarged prostate without lower urinary tract symptoms: Secondary | ICD-10-CM

## 2022-07-04 DIAGNOSIS — G4733 Obstructive sleep apnea (adult) (pediatric): Secondary | ICD-10-CM

## 2022-07-04 DIAGNOSIS — D62 Acute posthemorrhagic anemia: Secondary | ICD-10-CM

## 2022-07-04 DIAGNOSIS — S52021A Displaced fracture of olecranon process without intraarticular extension of right ulna, initial encounter for closed fracture: Secondary | ICD-10-CM

## 2022-07-04 LAB — RENAL FUNCTION PANEL
Albumin: 2.4 g/dL — ABNORMAL LOW (ref 3.5–5.0)
Anion gap: 15 (ref 5–15)
BUN: 39 mg/dL — ABNORMAL HIGH (ref 8–23)
CO2: 27 mmol/L (ref 22–32)
Calcium: 8.4 mg/dL — ABNORMAL LOW (ref 8.9–10.3)
Chloride: 91 mmol/L — ABNORMAL LOW (ref 98–111)
Creatinine, Ser: 5.94 mg/dL — ABNORMAL HIGH (ref 0.61–1.24)
GFR, Estimated: 9 mL/min — ABNORMAL LOW (ref >60)
Glucose, Bld: 139 mg/dL — ABNORMAL HIGH (ref 70–99)
Phosphorus: 6.6 mg/dL — ABNORMAL HIGH (ref 2.5–4.6)
Potassium: 4.5 mmol/L (ref 3.5–5.1)
Sodium: 133 mmol/L — ABNORMAL LOW (ref 135–145)

## 2022-07-04 LAB — CBC
HCT: 21.7 % — ABNORMAL LOW (ref 39.0–52.0)
Hemoglobin: 7 g/dL — ABNORMAL LOW (ref 13.0–17.0)
MCH: 31.1 pg (ref 26.0–34.0)
MCHC: 32.3 g/dL (ref 30.0–36.0)
MCV: 96.4 fL (ref 80.0–100.0)
Platelets: 178 10*3/uL (ref 150–400)
RBC: 2.25 MIL/uL — ABNORMAL LOW (ref 4.22–5.81)
RDW: 14.5 % (ref 11.5–15.5)
WBC: 10.4 10*3/uL (ref 4.0–10.5)
nRBC: 0 % (ref 0.0–0.2)

## 2022-07-04 LAB — IRON AND TIBC
Iron: 28 ug/dL — ABNORMAL LOW (ref 45–182)
Saturation Ratios: 11 % — ABNORMAL LOW (ref 17.9–39.5)
TIBC: 253 ug/dL (ref 250–450)
UIBC: 225 ug/dL

## 2022-07-04 LAB — TYPE AND SCREEN

## 2022-07-04 LAB — FOLATE: Folate: 4.8 ng/mL — ABNORMAL LOW (ref >5.9)

## 2022-07-04 LAB — HEMOGLOBIN AND HEMATOCRIT, BLOOD
HCT: 26.2 % — ABNORMAL LOW (ref 39.0–52.0)
Hemoglobin: 8.6 g/dL — ABNORMAL LOW (ref 13.0–17.0)

## 2022-07-04 LAB — BPAM RBC
Blood Product Expiration Date: 202407192359
ISSUE DATE / TIME: 202406211049

## 2022-07-04 LAB — VITAMIN B12: Vitamin B-12: 186 pg/mL (ref 180–914)

## 2022-07-04 LAB — PREPARE RBC (CROSSMATCH)

## 2022-07-04 LAB — FERRITIN: Ferritin: 178 ng/mL (ref 24–336)

## 2022-07-04 MED ORDER — ANTICOAGULANT SODIUM CITRATE 4% (200MG/5ML) IV SOLN: 5.0000 mL | Status: DC | PRN

## 2022-07-04 MED ORDER — HEPARIN SODIUM (PORCINE) 5000 UNIT/ML IJ SOLN
5000.0000 [IU] | Freq: Three times a day (TID) | INTRAMUSCULAR | Status: DC
  Administered 2022-07-04 – 2022-07-09 (×15): 5000 [IU] via SUBCUTANEOUS
  Filled 2022-07-04 (×16): qty 1

## 2022-07-04 MED ORDER — ALTEPLASE 2 MG IJ SOLR: 2.0000 mg | Freq: Once | INTRAMUSCULAR | Status: DC | PRN

## 2022-07-04 MED ORDER — SENNOSIDES-DOCUSATE SODIUM 8.6-50 MG PO TABS
1.0000 | ORAL_TABLET | Freq: Two times a day (BID) | ORAL | Status: DC
  Administered 2022-07-04 – 2022-07-09 (×9): 1 via ORAL
  Filled 2022-07-04 (×10): qty 1

## 2022-07-04 MED ORDER — SEVELAMER CARBONATE 800 MG PO TABS
800.0000 mg | ORAL_TABLET | Freq: Three times a day (TID) | ORAL | Status: DC
  Administered 2022-07-04 – 2022-07-09 (×14): 800 mg via ORAL
  Filled 2022-07-04 (×15): qty 1

## 2022-07-04 MED ORDER — LIDOCAINE-PRILOCAINE 2.5-2.5 % EX CREA: 1.0000 | TOPICAL_CREAM | CUTANEOUS | Status: DC | PRN

## 2022-07-04 MED ORDER — LIDOCAINE HCL (PF) 1 % IJ SOLN: 5.0000 mL | INTRAMUSCULAR | Status: DC | PRN

## 2022-07-04 MED ORDER — PENTAFLUOROPROP-TETRAFLUOROETH EX AERO: 1.0000 | INHALATION_SPRAY | CUTANEOUS | Status: DC | PRN

## 2022-07-04 MED ORDER — ACETAMINOPHEN 325 MG PO TABS: 650.0000 mg | ORAL_TABLET | Freq: Once | ORAL | Status: DC

## 2022-07-04 MED ORDER — DARBEPOETIN ALFA 100 MCG/0.5ML IJ SOSY
100.0000 ug | PREFILLED_SYRINGE | INTRAMUSCULAR | Status: DC
  Administered 2022-07-04: 100 ug via SUBCUTANEOUS
  Filled 2022-07-04: qty 0.5

## 2022-07-04 MED ORDER — SODIUM CHLORIDE 0.9% IV SOLUTION: Freq: Once | INTRAVENOUS | Status: DC

## 2022-07-04 MED ORDER — HEPARIN SODIUM (PORCINE) 1000 UNIT/ML DIALYSIS: 1000.0000 [IU] | INTRAMUSCULAR | Status: DC | PRN

## 2022-07-04 NOTE — Progress Notes (Signed)
PROGRESS NOTE    Jacob [name]  QMV:[id number] DOB: April 30, 1945 DOA: 07/02/2022 PCP: Ellyn Hack, MD    Chief Complaint  Patient presents with   Fall    Brief Narrative:  Patient pleasant 77 year old gentleman medical history significant of ESRD on HD Monday Wednesday Friday, hypertension, hyperlipidemia, prostate cancer status post radiation treatment, OSA on bedtime as needed O2 presented with a fall while walking up to walk away with his walker to hemodialysis.  Patient seen in the ED imaging done concerning for right hip fracture.  Patient seen by nephrology and underwent hemodialysis the evening of 07/02/2022.  Patient admitted, seen by orthopedics, underwent right hip fracture repair 07/03/2022.   Assessment & Plan:   Principal Problem:   Hip fracture (HCC) Active Problems:   Essential hypertension   Nocturnal hypoxemia   ESRD (end stage renal disease) on dialysis (HCC)   Mixed hyperlipidemia   Depression   Acute postoperative anemia due to expected blood loss   Closed fracture of right olecranon process   Benign prostatic hyperplasia   OSA (obstructive sleep apnea)   ESRD (end stage renal disease) (HCC)  #1 right intertrochanteric hip fracture/minimally displaced transverse fracture of the base of the olecranon on the right -Secondary to mechanical fall. -Patient seen in consultation by orthopedics and patient subsequently underwent operative fixation per orthopedics 07/03/2022. -Patient also noted with complaints of right elbow pain, plain films of the right elbow obtained showed a displaced transverse fracture of the base of the olecranon on the right.  Patient assessed by orthopedics and nonoperative management at this time. -PT/OT. -Pain management, DVT prophylaxis per orthopedics.   2.  ESRD on HD MWF -Patient seen in consultation by nephrology on admission and underwent hemodialysis on 07/02/2022 in preparation for operative fixation of right hip fracture  07/03/2022. -Patient in hemodialysis today back on regular schedule. -Per nephrology.  3.  Hypertension -Continue Bystolic, verapamil.  4.  BPH/remote prostate cancer -Tamsulosin.    5.  Hyperlipidemia -Statin.   6.  OSA -Not on CPAP. -Knows to wear nocturnal O2 about 2 nights per week.  7.  Mood disorder, dementia -Continue home regimen venlafaxine, donezepil, bupropion, melatonin.   -Delirium precautions.  8.  Postop acute blood loss anemia -Hemoglobin currently at 7.0 from 9.5 on admission.. -Check an anemia panel. -2 units PRBCs ordered to be transfused in hemodialysis, however per hemodialysis RN since patient is O+ blood blood bank will only allow 1 unit at a time with subsequent posttransfusion H&H prior to release of the second unit.   DVT prophylaxis: Heparin.  Code Status: Full Family Communication: Updated patient.  No family at bedside. Disposition: TBD  Status is: Inpatient Remains inpatient appropriate because: Severity of illness   Consultants:  Orthopedics: Earney Hamburg, PA 07/02/2022 Orthopedics: Dr. Susa Simmonds 07/03/2022 Nephrology: Dr. Signe Colt 07/02/2022  Procedures:  CT head 07/02/2022 CT C-spine 07/02/2022 Plain films of the right knee 07/02/2022 Chest x-ray 07/02/2022 Plain films of the right femur 07/03/2022 Plain films of the right elbow 07/03/2022 Plain films of the right hip and pelvis 07/02/2022 Transfuse 1 unit PRBC 07/04/2022 Plain films of the right elbow 07/03/2022  Antimicrobials:  Anti-infectives (From admission, onward)    Start     Dose/Rate Route Frequency Ordered Stop   07/03/22 1700  ceFAZolin (ANCEF) IVPB 2g/100 mL premix        2 g 200 mL/hr over 30 Minutes Intravenous Every 12 hours 07/03/22 1331 07/05/22 0459   07/03/22 0945  ceFAZolin (ANCEF) IVPB  2g/100 mL premix        2 g 200 mL/hr over 30 Minutes Intravenous On call to O.R. 07/03/22 0931 07/03/22 1050         Subjective: In hemodialysis.  Denies any chest pain.  No  shortness of breath.  No abdominal pain.  Right hip pain improved postop.  Complaining of some right elbow swelling and pain.  Tolerating current diet.  About to receive a unit of PRBCs in HD.   Objective: Vitals:   07/04/22 1400 07/04/22 1430 07/04/22 1523 07/04/22 1530  BP: 103/73 101/60 102/62 101/62  Pulse: (!) 138 73 72 72  Resp: 19 17 18 13   Temp:      TempSrc:      SpO2: 93% 100% 100% 100%  Weight:      Height:        Intake/Output Summary (Last 24 hours) at 07/04/2022 1746 Last data filed at 07/04/2022 1530 Gross per 24 hour  Intake 427 ml  Output 1000 ml  Net -573 ml   Filed Weights   07/02/22 2258 07/03/22 0932 07/04/22 0924  Weight: 84.4 kg 84.4 kg 66.8 kg    Examination:  General exam: NAD. Respiratory system: CTAB anterior lung fields.  No wheezes, no crackles, no rhonchi.  Fair air movement.  Speaking in full sentences.   Cardiovascular system: Regular rate rhythm no murmurs rubs or gallops.  No JVD.  No lower extremity edema.   Gastrointestinal system: Abdomen is soft, nontender, nondistended, positive bowel sounds.  No rebound.  No guarding.  Central nervous system: Alert and oriented. No focal neurological deficits. Extremities: Right hip with postop dressing in place.  Right elbow with slight edema, some tenderness to palpation. Skin: No rashes, lesions or ulcers Psychiatry: Judgement and insight appear normal. Mood & affect appropriate.     Data Reviewed: I have personally reviewed following labs and imaging studies  CBC: Recent Labs  Lab 07/02/22 1339 07/02/22 1352 07/03/22 0104 07/04/22 0641 07/04/22 1730  WBC 8.1  --  10.3 10.4  --   NEUTROABS 6.2  --   --   --   --   HGB 9.2* 9.5* 8.7* 7.0* 8.6*  HCT 28.5* 28.0* 26.5* 21.7* 26.2*  MCV 100.0  --  98.9 96.4  --   PLT 187  --  187 178  --     Basic Metabolic Panel: Recent Labs  Lab 07/02/22 1339 07/02/22 1352 07/03/22 0104 07/04/22 0641  NA 137 135 135 133*  K 4.0 3.9 3.9 4.5  CL  96* 100 95* 91*  CO2 28  --  29 27  GLUCOSE 96 95 102* 139*  BUN 36* 34* 13 39*  CREATININE 6.41* 7.10* 3.48* 5.94*  CALCIUM 8.8*  --  8.7* 8.4*  PHOS  --   --   --  6.6*    GFR: Estimated Creatinine Clearance: 10 mL/min (A) (by C-G formula based on SCr of 5.94 mg/dL (H)).  Liver Function Tests: Recent Labs  Lab 07/02/22 1339 07/04/22 0641  AST 13*  --   ALT 8  --   ALKPHOS 46  --   BILITOT 0.5  --   PROT 6.1*  --   ALBUMIN 2.8* 2.4*    CBG: No results for input(s): "GLUCAP" in the last 168 hours.   Recent Results (from the past 240 hour(s))  Surgical pcr screen     Status: None   Collection Time: 07/02/22  5:36 PM   Specimen: Nasal Mucosa; Nasal Swab  Result Value Ref Range Status   MRSA, PCR NEGATIVE NEGATIVE Final   Staphylococcus aureus NEGATIVE NEGATIVE Final    Comment: (NOTE) The Xpert SA Assay (FDA approved for NASAL specimens in patients 33 years of age and older), is one component of a comprehensive surveillance program. It is not intended to diagnose infection nor to guide or monitor treatment. Performed at [facility], [address], [place], [place] [address]          Radiology Studies: DG ELBOW COMPLETE RIGHT (3+VIEW)  Result Date: 07/03/2022 CLINICAL DATA:  Right-sided elbow pain EXAM: RIGHT ELBOW - COMPLETE 4 VIEW COMPARISON:  None Available. FINDINGS: There is a mildly displaced transverse fracture of the base of the olecranon on lateral view. No additional fracture or dislocation. Preserved joint spaces and bone mineralization. Surgical clips along the soft tissues. IMPRESSION: Minimally displaced transverse fracture of the base of the olecranon. Electronically Signed   By: Karen Kays M.D.   On: 07/03/2022 13:07   DG FEMUR, MIN 2 VIEWS RIGHT  Result Date: 07/03/2022 CLINICAL DATA:  Intertrochanteric femur fracture fixation. EXAM: RIGHT FEMUR 2 VIEWS COMPARISON:  Radiographs 07/02/2022. FINDINGS: C-arm fluoroscopy was provided in the  operating room without the presence of a radiologist.44.3 fluoroscopy time. 10.1 mGy air kerma. Six C-arm fluoroscopic images were obtained intraoperatively and are submitted for post operative interpretation. These images demonstrate the placement of a proximal right femoral intramedullary nail and dynamic screw across the intertrochanteric femur fracture. The hardware appears well positioned. There is improved alignment of the fracture, and no complications are identified. Please see intraoperative findings for further detail. IMPRESSION: Intraoperative fluoroscopic guidance for right femoral fracture fixation. Electronically Signed   By: Carey Bullocks M.D.   On: 07/03/2022 12:52   DG C-Arm 1-60 Min-No Report  Result Date: 07/03/2022 Fluoroscopy was utilized by the requesting physician.  No radiographic interpretation.        Scheduled Meds:  sodium chloride   Intravenous Once   acetaminophen  650 mg Oral Once   allopurinol  150 mg Oral Daily   aspirin EC  81 mg Oral Daily   buPROPion  300 mg Oral Daily   calcitRIOL  0.5 mcg Oral Q M,W,F-HD   Chlorhexidine Gluconate Cloth  6 each Topical Q0600   cinacalcet  30 mg Oral Q M,W,F-1800   darbepoetin (ARANESP) injection - DIALYSIS  100 mcg Subcutaneous Q Fri-1800   donepezil  5 mg Oral QHS   heparin injection (subcutaneous)  5,000 Units Subcutaneous Q8H   loratadine  10 mg Oral Daily   melatonin  10 mg Oral QHS   multivitamin  1 tablet Oral QHS   nebivolol  20 mg Oral QHS   pantoprazole  40 mg Oral QAC supper   polyethylene glycol  17 g Oral BID   rosuvastatin  5 mg Oral Daily   senna-docusate  1 tablet Oral BID   sevelamer carbonate  800 mg Oral TID WC   tamsulosin  0.4 mg Oral Daily   venlafaxine XR  75 mg Oral Daily   verapamil  80 mg Oral TID   Continuous Infusions:   ceFAZolin (ANCEF) IV 2 g (07/04/22 1735)   methocarbamol (ROBAXIN) IV       LOS: 2 days    Time spent: 35 minutes    Ramiro Harvest, MD [facility]   To contact the attending provider between 7A-7P or the covering provider during after hours 7P-7A, please log into the web site [email or web address]  and access using universal [place] password for that web site. If you do not have the password, please call the hospital operator.  07/04/2022, 5:46 PM

## 2022-07-04 NOTE — Progress Notes (Signed)
PT Cancellation Note  Patient Details Name: Jacob [name] MRN: [id number] DOB: 1945-12-09   Cancelled Treatment:    Reason Eval/Treat Not Completed: Patient at procedure or test/unavailable  Remains off unit. Will attempt formal evaluation tomorrow.   Kathlyn Sacramento, PT, DPT [facility]  Rehabilitation Services Physical Therapist Office: [phone] Website: [email or web address]   Berton Mount 07/04/2022, 3:54 PM

## 2022-07-04 NOTE — Plan of Care (Signed)

## 2022-07-04 NOTE — Op Note (Signed)
Jacob [name] male 77 y.o. 07/03/2022  PreOperative Diagnosis: Right intertochanteric hip fracture  PostOperative Diagnosis: same  PROCEDURE: Cephalomedullary nail of right hip fracture  SURGEON: Dub Mikes, MD  ASSISTANT: Jesse [place], PA-C was necessary for patient positioning, prep, drape and assistance with reduction and hardware placement  ANESTHESIA: General endotracheal tube anesthesia  FINDINGS: Displaced intertrochanteric hip fracture  IMPLANTS: Arthrex short innertroch nail  INDICATIONS:76 y.o. Jacob [name] a fall and sustained a intertrochanteric hip fracture.  Jacob [name] was indicated for surgery due to baseline ambulatory status and significant pain due to the fracture.  Discussion was had with the patient about the surgery and they wish to proceed.  Patient understood the risks, benefits and alternatives to surgery which include but are not limited to wound healing complications, infection, nonunion, malunion, need for further surgery as well as damage to surrounding structures. They also understood the potential for continued pain in that there were no guarantees of acceptable outcome After weighing these risks the patient opted to proceed with surgery.  PROCEDURE: Patient was identified in the preoperative holding area and the right hip was marked by myself.  The consent was signed by myself and the patient.  They  were taken to the operating room where general endotracheal anesthesia was induced without difficulty and then was moved supine on a Hana table.  The operative leg was placed into the traction boot.  The non-operative leg was well-padded and affixed to the right leg holder with coban.  The arm was crossed over the chest and well padded. 2 g of Ancef was given.  Surgical timeout was performed.    Using fluoroscopy appropriate AP and lateral x-rays of the hip were obtained.  Then using traction through the Hana table was able to reduce the fracture fragments  in a closed fashion.  Acceptable reduction was confirmed on AP and lateral x-rays.  Then the right hip was prepped and draped in usual sterile fashion.  A shower curtain drape was placed.  We began the surgery by placing the guidepin percutaneously at the appropriate starting point at the tip of the greater trochanter.  The appropriate starting point was confirmed on AP and lateral radiograph.  Then a 3 cm incision was made about the site of the percutaneous placement of the pin and the opening reamer with a soft tissue guide was placed into the wound down to the tip of the greater trochanter and the bone was entered with the opening reamer down to the level of lesser trochanter.  Then the openning reamer and guide pin were removed nail was placed. Then we chose an 9 mm cephalo-medullary nail.  This was placed without difficulty and appropriate positioning was confirmed on fluoroscopy.  Then using the blade insertion guide a second incision was made distally down the thigh to allow for placement of the cephalo-medullary screw.  The guidepin for the blade was placed up the femoral neck in the appropriate position center center in the femoral head.  Then the reamer was used to cephalo-medullary screw and it was placed without difficulty. Then the set screw was tightened.  Then the distal interlock screw was placed without difficulty.  Final films were taken.  The wounds were irrigated with normal saline and the incisions were closed in a layered fashion using 2-0 monocryl and staples.  Mepilex dressings were used.  Patient was then transferred to the hospital bed and awakened from anesthesia without difficulty.  Counts were correct at the end the case.  There were  no complications.  POST OPERATIVE INSTRUCTIONS: WBAT operative extremity No specific hip precautions Follow-up in 2 weeks for x-rays of the operative hip and suture removal if appropriate    BLOOD LOSS:  200 mL         DRAINS: none          SPECIMEN: none       COMPLICATIONS:  * No complications entered in OR log *         Disposition: PACU - hemodynamically stable.         Condition: stable

## 2022-07-04 NOTE — Progress Notes (Signed)
[facility] Progress Note   Assessment/ Plan:   Dialysis Orders:  [facility] MWF 4:00  400/A1.5x EDW 86kg 2K/2Ca AVG  Heparin 1500 Mircera 75 q 2 wks -last 6/12 Calcitriol 0.5 TIW, Sensipar 30 TIW   Assessment/Plan:  R hip fracture d/t mechanical fall - Ortho consulted. S/p repair 07/03/22  ESRD -  HD MWF. Continue on schedule. Next HD 6/21.   Hypertension/volume  - BP on low side. Volume ok. Minimal UF.   Anemia  - Hgb 9.2>8.7  on ESA. Follow trends   Metabolic bone disease -  Ca acceptable. Continue binders/calcitriol/Sensipar when eating   Nutrition - Renal diet/Prot supp when eating.   Subjective:    Seen today in HD.  S/p Fx repair yesterday.  Hgb 7.0 today, getting blood.  Feels better.     Objective:   BP (!) 89/59   Pulse 71   Temp 97.9 F (36.6 C)   Resp 13   Ht 6\' 2"  (1.88 m)   Wt 66.8 kg   SpO2 95%   BMI 18.91 kg/m   Physical Exam: Gen:NAD, sitting in bed CVS: RRR  Pulm: clear Abd: soft Ext: R hip dressing in place ACCESS: + AVF  Labs: BMET Recent Labs  Lab 07/02/22 1339 07/02/22 1352 07/03/22 0104 07/04/22 0641  NA 137 135 135 133*  K 4.0 3.9 3.9 4.5  CL 96* 100 95* 91*  CO2 28  --  29 27  GLUCOSE 96 95 102* 139*  BUN 36* 34* 13 39*  CREATININE 6.41* 7.10* 3.48* 5.94*  CALCIUM 8.8*  --  8.7* 8.4*  PHOS  --   --   --  6.6*   CBC Recent Labs  Lab 07/02/22 1339 07/02/22 1352 07/03/22 0104 07/04/22 0641  WBC 8.1  --  10.3 10.4  NEUTROABS 6.2  --   --   --   HGB 9.2* 9.5* 8.7* 7.0*  HCT 28.5* 28.0* 26.5* 21.7*  MCV 100.0  --  98.9 96.4  PLT 187  --  187 178      Medications:     sodium chloride   Intravenous Once   acetaminophen  650 mg Oral Once   allopurinol  150 mg Oral Daily   aspirin EC  81 mg Oral Daily   buPROPion  300 mg Oral Daily   calcitRIOL  0.5 mcg Oral Q M,W,F-HD   Chlorhexidine Gluconate Cloth  6 each Topical Q0600   cinacalcet  30 mg Oral Q M,W,F-1800   donepezil  5 mg Oral QHS   heparin injection  (subcutaneous)  5,000 Units Subcutaneous Q8H   loratadine  10 mg Oral Daily   melatonin  10 mg Oral QHS   multivitamin  1 tablet Oral QHS   nebivolol  20 mg Oral QHS   pantoprazole  40 mg Oral QAC supper   polyethylene glycol  17 g Oral BID   rosuvastatin  5 mg Oral Daily   senna-docusate  1 tablet Oral BID   tamsulosin  0.4 mg Oral Daily   venlafaxine XR  75 mg Oral Daily   verapamil  80 mg Oral TID     Bufford Buttner, MD 07/04/2022, 10:02 AM

## 2022-07-04 NOTE — Progress Notes (Signed)
OT Cancellation Note  Patient Details Name: Jacob [name] MRN: [id number] DOB: 1945/04/07   Cancelled Treatment:    Reason Eval/Treat Not Completed: Patient declined, no reason specified (Attempted to follow-up with patient after HD treatment, pt politely refused and reports wanting to eat and rest since not being able to do so all day. OT will continue to follow-up with patient as able.)  07/04/2022  AB, OTR/L  Acute Rehabilitation Services  Office: [phone]   Tristan Schroeder 07/04/2022, 5:52 PM

## 2022-07-04 NOTE — Progress Notes (Signed)
OT Cancellation Note  Patient Details Name: Jacob [name] MRN: [id number] DOB: 05/03/1945   Cancelled Treatment:    Reason Eval/Treat Not Completed: Patient at procedure or test/ unavailable (Pt at HD, OT will continue to follow-up with patient as able.)  07/04/2022  AB, OTR/L  Acute Rehabilitation Services  Office: [phone]   Tristan Schroeder 07/04/2022, 9:32 AM

## 2022-07-04 NOTE — Progress Notes (Signed)
Orders to receive 2 units of PRBC. One unit of blood given in hemodialysis, orders to recheck HGB before next unit. Hemoglobin rechecked and was 8.6 MD notified and stated to hold off on giving the second unit. Patient and family informed.

## 2022-07-04 NOTE — Progress Notes (Signed)
Orthopedic Tech Progress Note Patient Details:  Jacob [name] 05-23-1945 [id number]  Ortho Devices Type of Ortho Device: Long arm splint Ortho Device/Splint Location: RUE Ortho Device/Splint Interventions: Ordered, Application, Adjustment   Post Interventions Patient Tolerated: Well Instructions Provided: Care of device  [place] A Gerilyn Pilgrim 07/04/2022, 6:22 PM

## 2022-07-04 NOTE — Progress Notes (Signed)
PT Cancellation Note  Patient Details Name: Keiden Deskin MRN: [id number] DOB: August 23, 1945   Cancelled Treatment:    Reason Eval/Treat Not Completed: Patient at procedure or test/unavailable  Currently at HD. Will follow-up this afternoon for formal PT evaluation.  Kathlyn [place], PT, DPT [facility]  Rehabilitation Services Physical Therapist Office: [phone] Website: [email or web address]   Berton Mount 07/04/2022, 9:45 AM

## 2022-07-04 NOTE — Progress Notes (Signed)
     Jacob [name] is a 77 y.o. male   Orthopaedic diagnosis: Status post IM nail fixation of right intertrochanteric hip fracture 07/03/2022  Subjective: Patient seen and evaluated.  He is resting comfortably.  Reports pain is controlled and improved in the hip postoperatively.  He continues to have right elbow pain we have obtained radiographs.  He does have an AV fistula in the right arm for dialysis access.  Denies any numbness, tingling, or radiating pain.   Objectyive: Vitals:   07/04/22 0429 07/04/22 0744  BP: (!) 100/59 (!) 94/58  Pulse: 74 72  Resp: 17 18  Temp: 97.9 F (36.6 C) 97.8 F (36.6 C)  SpO2: 99% 99%     Exam: Awake and alert Respirations even and unlabored No acute distress  Examination of the right hip shows intact clean, and dry surgical dressing.  The dressing was not removed.  He tolerates gentle flexion of the knee.  His calf is soft and nontender.  He has fairly strength at the ankle.  Endorses intact sensation to light touch distally.  Warm well-perfused distally  Examination right elbow shows tenderness to palpation about the olecranon.  There is no gross bony.  Does have notable swelling about the elbow.  He has pain with passive and active flexion extension at the elbow.  He has good range of motion, sensation, and motor function distally in the hand and wrist.  AV fistula noted anterior aspect of the upper arm.  Warm and well-perfused distally.  Assessment: Postop day 1 status post above, doing well Right minimally displaced transverse olecranon fracture  Plan: Patient seems to be doing well from his hip fracture.  Unfortunately, he does have olecranon fracture of the right elbow.  This may require surgical management. This will be discussed amongst the orthopedic team.  In the meantime we will place him in a long-arm splint in slight extension.  Will be sure to leave his AV fistula site accessible.  This obviously will make it difficult for him  to weight-bear with a walker.  For now, we will keep him on bedrest but he may work with physical therapy otherwise for his hip.  Pain control as needed, doing well on current regimen.  Plan for oxycodone at discharge.  Will try to limit narcotics in the setting  Discussed DVT prophylaxis with medicine team in this setting provided ESRD.  Will consult pharmacy for heparin dosing.     Jacob [name] J. [place], PA-C

## 2022-07-04 NOTE — Anesthesia Postprocedure Evaluation (Signed)
Anesthesia Post Note  Patient: Jacob [name]  Procedure(s) Performed: OPEN TREATMENT RIGHT HIP FRACTURE (Right: Hip)     Patient location during evaluation: PACU Anesthesia Type: General Level of consciousness: awake and alert Pain management: pain level controlled Vital Signs Assessment: post-procedure vital signs reviewed and stable Respiratory status: spontaneous breathing, nonlabored ventilation, respiratory function stable and patient connected to nasal cannula oxygen Cardiovascular status: blood pressure returned to baseline and stable Postop Assessment: no apparent nausea or vomiting Anesthetic complications: no   No notable events documented.  Last Vitals:  Vitals:   07/04/22 1145 07/04/22 1200  BP: 100/61 100/61  Pulse: 71 73  Resp: 16 (!) 23  Temp: 36.5 C   SpO2: 98% (!) 86%    Last Pain:  Vitals:   07/04/22 1145  TempSrc: Oral  PainSc:                  Consuelo Thayne S

## 2022-07-05 DIAGNOSIS — N186 End stage renal disease: Secondary | ICD-10-CM | POA: Diagnosis not present

## 2022-07-05 DIAGNOSIS — E538 Deficiency of other specified B group vitamins: Secondary | ICD-10-CM

## 2022-07-05 DIAGNOSIS — I10 Essential (primary) hypertension: Secondary | ICD-10-CM | POA: Diagnosis not present

## 2022-07-05 DIAGNOSIS — S72001P Fracture of unspecified part of neck of right femur, subsequent encounter for closed fracture with malunion: Secondary | ICD-10-CM | POA: Diagnosis not present

## 2022-07-05 DIAGNOSIS — F32A Depression, unspecified: Secondary | ICD-10-CM | POA: Diagnosis not present

## 2022-07-05 LAB — TYPE AND SCREEN
ABO/RH(D): O POS
Unit division: 0

## 2022-07-05 LAB — RENAL FUNCTION PANEL
Albumin: 2.4 g/dL — ABNORMAL LOW (ref 3.5–5.0)
Anion gap: 11 (ref 5–15)
BUN: 23 mg/dL (ref 8–23)
CO2: 27 mmol/L (ref 22–32)
Calcium: 7.9 mg/dL — ABNORMAL LOW (ref 8.9–10.3)
Chloride: 97 mmol/L — ABNORMAL LOW (ref 98–111)
Creatinine, Ser: 3.8 mg/dL — ABNORMAL HIGH (ref 0.61–1.24)
GFR, Estimated: 16 mL/min — ABNORMAL LOW (ref >60)
Glucose, Bld: 99 mg/dL (ref 70–99)
Phosphorus: 3.5 mg/dL (ref 2.5–4.6)
Potassium: 4 mmol/L (ref 3.5–5.1)
Sodium: 135 mmol/L (ref 135–145)

## 2022-07-05 LAB — CBC
HCT: 26.2 % — ABNORMAL LOW (ref 39.0–52.0)
Hemoglobin: 8.5 g/dL — ABNORMAL LOW (ref 13.0–17.0)
MCH: 30.8 pg (ref 26.0–34.0)
MCHC: 32.4 g/dL (ref 30.0–36.0)
MCV: 94.9 fL (ref 80.0–100.0)
Platelets: 162 10*3/uL (ref 150–400)
RBC: 2.76 MIL/uL — ABNORMAL LOW (ref 4.22–5.81)
RDW: 15 % (ref 11.5–15.5)
WBC: 7.9 10*3/uL (ref 4.0–10.5)
nRBC: 0 % (ref 0.0–0.2)

## 2022-07-05 LAB — BPAM RBC: Unit Type and Rh: 5100

## 2022-07-05 MED ORDER — FOLIC ACID 1 MG PO TABS
1.0000 mg | ORAL_TABLET | Freq: Every day | ORAL | Status: DC
  Administered 2022-07-05 – 2022-07-09 (×5): 1 mg via ORAL
  Filled 2022-07-05 (×5): qty 1

## 2022-07-05 MED ORDER — VERAPAMIL HCL 40 MG PO TABS
80.0000 mg | ORAL_TABLET | Freq: Three times a day (TID) | ORAL | Status: DC
  Filled 2022-07-05: qty 2

## 2022-07-05 NOTE — Progress Notes (Signed)
     Nellie Micale is a 77 y.o. male   Orthopaedic diagnosis:  07/03/22 IM nail R IT femur fx (Dr. Susa Simmonds) 07/03/22 non-op tx R olecranon fx  Subjective: Patient seen and evaluated.  He is resting comfortably, with reasonably controlled pain.  He does have an AV fistula in the right arm for dialysis access.  Denies any numbness, tingling, or radiating pain.   Objectyive: Vitals:   07/04/22 2006 07/05/22 0559  BP: 93/61 (!) 82/68  Pulse: 75 76  Resp: 15 15  Temp: 98.9 F (37.2 C) 98.5 F (36.9 C)  SpO2: 92% 98%     Exam: Awake and alert Respirations even and unlabored No acute distress  Examination of the right hip shows intact clean, and dry surgical dressing.  The dressing was not removed.  He tolerates gentle flexion of the knee.  His calf is soft and nontender.  He has fairly strength at the ankle.  Endorses intact sensation to light touch distally.  Warm well-perfused distally  Examination right UE reveals a splint encompassing the forearm, but allowing nearly un-restricted elbow ROM.  He has good range of motion, sensation, and motor function distally in the hand and wrist.  AV fistula noted anterior aspect of the upper arm.  Warm and well-perfused distally.  Assessment: Postop day 2 status post above, doing well Right minimally displaced transverse olecranon fracture  Recs:  -will work with splint tech to try to better splint/protect R UE -continue PT, WBAT R LE, NWB R UE -VTE=SCDs & heparin   Neil Crouch, MD Hand & Ortho Surgery

## 2022-07-05 NOTE — Progress Notes (Signed)
PROGRESS NOTE    Jacob [name]  VQQ:[id number] DOB: 11-Mar-1945 DOA: 07/02/2022 PCP: Ellyn Hack, MD    Chief Complaint  Patient presents with   Fall    Brief Narrative:  Patient pleasant 77 year old gentleman medical history significant of ESRD on HD Monday Wednesday Friday, hypertension, hyperlipidemia, prostate cancer status post radiation treatment, OSA on bedtime as needed O2 presented with a fall while walking up to walk away with his walker to hemodialysis.  Patient seen in the ED imaging done concerning for right hip fracture.  Patient seen by nephrology and underwent hemodialysis the evening of 07/02/2022.  Patient admitted, seen by orthopedics, underwent right hip fracture repair 07/03/2022.   Assessment & Plan:   Principal Problem:   Hip fracture (HCC) Active Problems:   Essential hypertension   Nocturnal hypoxemia   ESRD (end stage renal disease) on dialysis (HCC)   Mixed hyperlipidemia   Depression   Acute postoperative anemia due to expected blood loss   Closed fracture of right olecranon process   Benign prostatic hyperplasia   OSA (obstructive sleep apnea)   ESRD (end stage renal disease) (HCC)  #1 right intertrochanteric hip fracture/minimally displaced transverse fracture of the base of the olecranon on the right -Secondary to mechanical fall. -Patient seen in consultation by orthopedics and patient subsequently underwent operative fixation per orthopedics 07/03/2022. -Patient also noted with complaints of right elbow pain, plain films of the right elbow obtained showed a displaced transverse fracture of the base of the olecranon on the right.  Patient assessed by orthopedics and nonoperative management at this time. -PT/OT. -Pain management, DVT prophylaxis per orthopedics.   2.  ESRD on HD MWF -Patient seen in consultation by nephrology on admission and underwent hemodialysis on 07/02/2022 in preparation for operative fixation of right hip fracture  07/03/2022. -Per nephrology.  3.  Hypertension -Continue Bystolic.  - Hold verapamil today due to soft blood pressure.   4.  BPH/remote prostate cancer -Tamsulosin.    5.  Hyperlipidemia -Continue statin.  6.  OSA -Not on CPAP. -Knows to wear nocturnal O2 about 2 nights per week.  7.  Mood disorder, dementia -Continue home regimen venlafaxine, donezepil, bupropion, melatonin.   -Delirium precautions.  8.  Postop acute blood loss anemia/folate deficiency -Hemoglobin currently at 8.5 from 7.0 from 9.5 on admission.. -Anemia panel with iron level of 28, TIBC of 253, folate of 4.8, ferritin of 178.   -Status post transfusion 1 unit packed red blood cells, hemoglobin currently stable.   -Place on daily folic acid tablets.   -Follow H&H.   -Transfusion threshold hemoglobin  < 7.   DVT prophylaxis: Heparin.  Code Status: Full Family Communication: Updated patient.  No family at bedside. Disposition: TBD.  SNF versus home with home health.  Status is: Inpatient Remains inpatient appropriate because: Severity of illness   Consultants:  Orthopedics: Earney Hamburg, PA 07/02/2022 Orthopedics: Dr. Susa Simmonds 07/03/2022 Nephrology: Dr. Signe Colt 07/02/2022  Procedures:  CT head 07/02/2022 CT C-spine 07/02/2022 Plain films of the right knee 07/02/2022 Chest x-ray 07/02/2022 Plain films of the right femur 07/03/2022 Plain films of the right elbow 07/03/2022 Plain films of the right hip and pelvis 07/02/2022 Transfuse 1 unit PRBC 07/04/2022 Plain films of the right elbow 07/03/2022 Plain films of the left foot 07/04/2022  Antimicrobials:  Anti-infectives (From admission, onward)    Start     Dose/Rate Route Frequency Ordered Stop   07/03/22 1700  ceFAZolin (ANCEF) IVPB 2g/100 mL premix  2 g 200 mL/hr over 30 Minutes Intravenous Every 12 hours 07/03/22 1331 07/04/22 1805   07/03/22 0945  ceFAZolin (ANCEF) IVPB 2g/100 mL premix        2 g 200 mL/hr over 30 Minutes Intravenous On call to  O.R. 07/03/22 0931 07/03/22 1050         Subjective: Laying in bed.  Denies any chest pain or shortness of breath.  Some pain in the right elbow.  Right upper extremity currently in sling.  Denies any significant right hip pain.  Complaining of left great toe being bent however denies any significant pain in the left great toe.    Objective: Vitals:   07/04/22 2006 07/05/22 0559 07/05/22 0859 07/05/22 0911  BP: 93/61 (!) 82/68 (!) 82/59 (!) 92/55  Pulse: 75 76    Resp: 15 15    Temp: 98.9 F (37.2 C) 98.5 F (36.9 C)    TempSrc: Oral     SpO2: 92% 98%    Weight:      Height:        Intake/Output Summary (Last 24 hours) at 07/05/2022 1251 Last data filed at 07/04/2022 1530 Gross per 24 hour  Intake --  Output 1000 ml  Net -1000 ml    Filed Weights   07/02/22 2258 07/03/22 0932 07/04/22 0924  Weight: 84.4 kg 84.4 kg 66.8 kg    Examination:  General exam: NAD. Respiratory system: Lungs clear to auscultation bilaterally.  No wheezes, no crackles, no rhonchi.  Fair air movement.  Speaking in full sentences.  Cardiovascular system: RRR no murmurs rubs or gallops.  No JVD.  No lower extremity edema.  Gastrointestinal system: Abdomen is soft, nontender, nondistended, positive bowel sounds.  No rebound.  No guarding.  Central nervous system: Alert and oriented. No focal neurological deficits. Extremities: Right hip with postop dressing in place.  Right upper extremity in sling and cast.   Skin: No rashes, lesions or ulcers Psychiatry: Judgement and insight appear normal. Mood & affect appropriate.     Data Reviewed: I have personally reviewed following labs and imaging studies  CBC: Recent Labs  Lab 07/02/22 1339 07/02/22 1352 07/03/22 0104 07/04/22 0641 07/04/22 1730 07/05/22 0244  WBC 8.1  --  10.3 10.4  --  7.9  NEUTROABS 6.2  --   --   --   --   --   HGB 9.2* 9.5* 8.7* 7.0* 8.6* 8.5*  HCT 28.5* 28.0* 26.5* 21.7* 26.2* 26.2*  MCV 100.0  --  98.9 96.4  --  94.9   PLT 187  --  187 178  --  162     Basic Metabolic Panel: Recent Labs  Lab 07/02/22 1339 07/02/22 1352 07/03/22 0104 07/04/22 0641 07/05/22 0244  NA 137 135 135 133* 135  K 4.0 3.9 3.9 4.5 4.0  CL 96* 100 95* 91* 97*  CO2 28  --  29 27 27   GLUCOSE 96 95 102* 139* 99  BUN 36* 34* 13 39* 23  CREATININE 6.41* 7.10* 3.48* 5.94* 3.80*  CALCIUM 8.8*  --  8.7* 8.4* 7.9*  PHOS  --   --   --  6.6* 3.5     GFR: Estimated Creatinine Clearance: 15.6 mL/min (A) (by C-G formula based on SCr of 3.8 mg/dL (H)).  Liver Function Tests: Recent Labs  Lab 07/02/22 1339 07/04/22 0641 07/05/22 0244  AST 13*  --   --   ALT 8  --   --   ALKPHOS 46  --   --  BILITOT 0.5  --   --   PROT 6.1*  --   --   ALBUMIN 2.8* 2.4* 2.4*     CBG: No results for input(s): "GLUCAP" in the last 168 hours.   Recent Results (from the past 240 hour(s))  Surgical pcr screen     Status: None   Collection Time: 07/02/22  5:36 PM   Specimen: Nasal Mucosa; Nasal Swab  Result Value Ref Range Status   MRSA, PCR NEGATIVE NEGATIVE Final   Staphylococcus aureus NEGATIVE NEGATIVE Final    Comment: (NOTE) The Xpert SA Assay (FDA approved for NASAL specimens in patients 35 years of age and older), is one component of a comprehensive surveillance program. It is not intended to diagnose infection nor to guide or monitor treatment. Performed at [facility], [address] [place], [place] [address]          Radiology Studies: DG Foot Complete Left  Result Date: 07/04/2022 CLINICAL DATA:  Left toe pain EXAM: LEFT FOOT - COMPLETE 3+ VIEW COMPARISON:  None Available. FINDINGS: Demineralization. Hallux valgus. No acute fracture or dislocation. Degenerative arthritis at the first MTP joint, the midfoot, and DIP joints. Soft tissues are radiographically unremarkable. IMPRESSION: 1. No acute fracture or dislocation. 2. Hallux valgus. Electronically Signed   By: Minerva Fester M.D.   On: 07/04/2022 19:28    DG ELBOW COMPLETE RIGHT (3+VIEW)  Result Date: 07/03/2022 CLINICAL DATA:  Right-sided elbow pain EXAM: RIGHT ELBOW - COMPLETE 4 VIEW COMPARISON:  None Available. FINDINGS: There is a mildly displaced transverse fracture of the base of the olecranon on lateral view. No additional fracture or dislocation. Preserved joint spaces and bone mineralization. Surgical clips along the soft tissues. IMPRESSION: Minimally displaced transverse fracture of the base of the olecranon. Electronically Signed   By: Karen Kays M.D.   On: 07/03/2022 13:07        Scheduled Meds:  sodium chloride   Intravenous Once   acetaminophen  650 mg Oral Once   allopurinol  150 mg Oral Daily   aspirin EC  81 mg Oral Daily   buPROPion  300 mg Oral Daily   calcitRIOL  0.5 mcg Oral Q M,W,F-HD   Chlorhexidine Gluconate Cloth  6 each Topical Q0600   cinacalcet  30 mg Oral Q M,W,F-1800   darbepoetin (ARANESP) injection - DIALYSIS  100 mcg Subcutaneous Q Fri-1800   donepezil  5 mg Oral QHS   heparin injection (subcutaneous)  5,000 Units Subcutaneous Q8H   loratadine  10 mg Oral Daily   melatonin  10 mg Oral QHS   multivitamin  1 tablet Oral QHS   nebivolol  20 mg Oral QHS   pantoprazole  40 mg Oral QAC supper   polyethylene glycol  17 g Oral BID   rosuvastatin  5 mg Oral Daily   senna-docusate  1 tablet Oral BID   sevelamer carbonate  800 mg Oral TID WC   tamsulosin  0.4 mg Oral Daily   venlafaxine XR  75 mg Oral Daily   [START ON 07/06/2022] verapamil  80 mg Oral TID   Continuous Infusions:  methocarbamol (ROBAXIN) IV       LOS: 3 days    Time spent: 35 minutes    Ramiro Harvest, MD [facility]   To contact the attending provider between 7A-7P or the covering provider during after hours 7P-7A, please log into the web site [email or web address] and access using universal [place] password for that web site.  If you do not have the password, please call the hospital operator.  07/05/2022, 12:51 PM

## 2022-07-05 NOTE — Progress Notes (Signed)
[facility] Progress Note   Subjective:   Patient seen and examined at bedside.  Sitting up eating breakfast.  Tolerated dialysis well yesterday.  Denies CP, SOB, abdominal pain and n/v/d.  Admits to intermittently productive cough with clear sputum.  No other specific complaints.   Objective Vitals:   07/04/22 2006 07/05/22 0559 07/05/22 0859 07/05/22 0911  BP: 93/61 (!) 82/68 (!) 82/59 (!) 92/55  Pulse: 75 76    Resp: 15 15    Temp: 98.9 F (37.2 C) 98.5 F (36.9 C)    TempSrc: Oral     SpO2: 92% 98%    Weight:      Height:       Physical Exam General:well appearing male in NAD, sitting in bedside chair Heart:RRR Lungs: +scattered wheezing, no crackles Abdomen:soft, NTND Extremities:no LE edema, R arm w/splint in place Dialysis Access: RU AVF   Filed Weights   07/02/22 2258 07/03/22 0932 07/04/22 0924  Weight: 84.4 kg 84.4 kg 66.8 kg    Intake/Output Summary (Last 24 hours) at 07/05/2022 0946 Last data filed at 07/04/2022 1530 Gross per 24 hour  Intake 188 ml  Output 1000 ml  Net -812 ml    Additional Objective Labs: Basic Metabolic Panel: Recent Labs  Lab 07/03/22 0104 07/04/22 0641 07/05/22 0244  NA 135 133* 135  K 3.9 4.5 4.0  CL 95* 91* 97*  CO2 29 27 27   GLUCOSE 102* 139* 99  BUN 13 39* 23  CREATININE 3.48* 5.94* 3.80*  CALCIUM 8.7* 8.4* 7.9*  PHOS  --  6.6* 3.5   Liver Function Tests: Recent Labs  Lab 07/02/22 1339 07/04/22 0641 07/05/22 0244  AST 13*  --   --   ALT 8  --   --   ALKPHOS 46  --   --   BILITOT 0.5  --   --   PROT 6.1*  --   --   ALBUMIN 2.8* 2.4* 2.4*   CBC: Recent Labs  Lab 07/02/22 1339 07/02/22 1352 07/03/22 0104 07/04/22 0641 07/04/22 1730 07/05/22 0244  WBC 8.1  --  10.3 10.4  --  7.9  NEUTROABS 6.2  --   --   --   --   --   HGB 9.2*   < > 8.7* 7.0* 8.6* 8.5*  HCT 28.5*   < > 26.5* 21.7* 26.2* 26.2*  MCV 100.0  --  98.9 96.4  --  94.9  PLT 187  --  187 178  --  162   < > = values in this  interval not displayed.   Iron Studies:  Recent Labs    07/04/22 1730  IRON 28*  TIBC 253  FERRITIN 178   Lab Results  Component Value Date   INR 1.1 02/21/2022   INR 1.1 02/16/2021   INR 1.1 05/11/2020   Studies/Results: DG Foot Complete Left  Result Date: 07/04/2022 CLINICAL DATA:  Left toe pain EXAM: LEFT FOOT - COMPLETE 3+ VIEW COMPARISON:  None Available. FINDINGS: Demineralization. Hallux valgus. No acute fracture or dislocation. Degenerative arthritis at the first MTP joint, the midfoot, and DIP joints. Soft tissues are radiographically unremarkable. IMPRESSION: 1. No acute fracture or dislocation. 2. Hallux valgus. Electronically Signed   By: Minerva Fester M.D.   On: 07/04/2022 19:28   DG ELBOW COMPLETE RIGHT (3+VIEW)  Result Date: 07/03/2022 CLINICAL DATA:  Right-sided elbow pain EXAM: RIGHT ELBOW - COMPLETE 4 VIEW COMPARISON:  None Available. FINDINGS: There is a mildly displaced transverse  fracture of the base of the olecranon on lateral view. No additional fracture or dislocation. Preserved joint spaces and bone mineralization. Surgical clips along the soft tissues. IMPRESSION: Minimally displaced transverse fracture of the base of the olecranon. Electronically Signed   By: Karen Kays M.D.   On: 07/03/2022 13:07   DG FEMUR, MIN 2 VIEWS RIGHT  Result Date: 07/03/2022 CLINICAL DATA:  Intertrochanteric femur fracture fixation. EXAM: RIGHT FEMUR 2 VIEWS COMPARISON:  Radiographs 07/02/2022. FINDINGS: C-arm fluoroscopy was provided in the operating room without the presence of a radiologist.44.3 fluoroscopy time. 10.1 mGy air kerma. Six C-arm fluoroscopic images were obtained intraoperatively and are submitted for post operative interpretation. These images demonstrate the placement of a proximal right femoral intramedullary nail and dynamic screw across the intertrochanteric femur fracture. The hardware appears well positioned. There is improved alignment of the fracture, and no  complications are identified. Please see intraoperative findings for further detail. IMPRESSION: Intraoperative fluoroscopic guidance for right femoral fracture fixation. Electronically Signed   By: Carey Bullocks M.D.   On: 07/03/2022 12:52   DG C-Arm 1-60 Min-No Report  Result Date: 07/03/2022 Fluoroscopy was utilized by the requesting physician.  No radiographic interpretation.    Medications:  methocarbamol (ROBAXIN) IV      sodium chloride   Intravenous Once   acetaminophen  650 mg Oral Once   allopurinol  150 mg Oral Daily   aspirin EC  81 mg Oral Daily   buPROPion  300 mg Oral Daily   calcitRIOL  0.5 mcg Oral Q M,W,F-HD   Chlorhexidine Gluconate Cloth  6 each Topical Q0600   cinacalcet  30 mg Oral Q M,W,F-1800   darbepoetin (ARANESP) injection - DIALYSIS  100 mcg Subcutaneous Q Fri-1800   donepezil  5 mg Oral QHS   heparin injection (subcutaneous)  5,000 Units Subcutaneous Q8H   loratadine  10 mg Oral Daily   melatonin  10 mg Oral QHS   multivitamin  1 tablet Oral QHS   nebivolol  20 mg Oral QHS   pantoprazole  40 mg Oral QAC supper   polyethylene glycol  17 g Oral BID   rosuvastatin  5 mg Oral Daily   senna-docusate  1 tablet Oral BID   sevelamer carbonate  800 mg Oral TID WC   tamsulosin  0.4 mg Oral Daily   venlafaxine XR  75 mg Oral Daily   [START ON 07/06/2022] verapamil  80 mg Oral TID    Dialysis Orders: [facility] MWF 4:00  400/A1.5x EDW 86kg 2K/2Ca AVG  Heparin 1500 Mircera 75 q 2 wks -last 6/12 Calcitriol 0.5 TIW, Sensipar 30 TIW   Assessment/Plan:  R hip fracture d/t mechanical fall - Ortho consulted. S/p repair 07/03/22 R olecranon Frx - splint in place.    ESRD -  HD MWF. Continue on schedule. Next HD 6/24   Hypertension/volume  - BP soft today, may need to decrease meds. Volume ok. UF 1 L yesterday.   Anemia  - Hgb 9.2>8.5 on ESA. 1 unit pRBC given yesterday. Follow trends   Metabolic bone disease - Corrected Ca and phos in goal. Continue  binders/calcitriol/Sensipar.  Nutrition - Renal diet/Prot supp.  Virgina Norfolk, PA-C [facility] 07/05/2022,9:46 AM  LOS: 3 days

## 2022-07-05 NOTE — Evaluation (Signed)
Occupational Therapy Evaluation Patient Details Name: Jacob [name] MRN: [id number] DOB: 12/21/1945 Today's Date: 07/05/2022   History of Present Illness 77 y.o. male presenting with fall, found to have R hip fx. He is now s/p R hip repair 07/03/22. Non-op R olecranon fx splinted. NWB RUE with WBAT RLE. PMH of ESRD on HD, HTN, HLD, gout, hx prostate cancer s/p radiation.   Clinical Impression   Pt currently max to total +2 for simulated LB selfcare, sit to stand, and squat pivot transfers maintaining NWBing over the RLE.  Increased pain noted with in the RLE throughout transitional movements.  Pt is living with his spouse and son.  The spouse cannot provide assist per his report and the son works.  Based on pt's current level of assist, feel he will benefit from acute care OT to help increase functional performance of basic ADL tasks.  Anticipate pt will need 24 hour assist post discharge. Post acute stay he will benefit from continued inpatient follow up therapy, <3 hours/day.       Recommendations for follow up therapy are one component of a multi-disciplinary discharge planning process, led by the attending physician.  Recommendations may be updated based on patient status, additional functional criteria and insurance authorization.   Assistance Recommended at Discharge Frequent or constant Supervision/Assistance  Patient can return home with the following Two people to help with walking and/or transfers;Two people to help with bathing/dressing/bathroom;Assist for transportation;Help with stairs or ramp for entrance;Assistance with cooking/housework    Functional Status Assessment  Patient has had a recent decline in their functional status and demonstrates the ability to make significant improvements in function in a reasonable and predictable amount of time.  Equipment Recommendations  None recommended by OT (TBD next venue of care)       Precautions / Restrictions  Precautions Precautions: Fall Required Braces or Orthoses: Splint/Cast;Sling Splint/Cast: right long arm splint Restrictions Weight Bearing Restrictions: Yes RUE Weight Bearing: Non weight bearing RLE Weight Bearing: Weight bearing as tolerated      Mobility Bed Mobility Overal bed mobility: Needs Assistance Bed Mobility: Supine to Sit     Supine to sit: Max assist, HOB elevated     General bed mobility comments: Assist needed for all aspects of supine to sit    Transfers Overall transfer level: Needs assistance Equipment used: None Transfers: Sit to/from Stand, Bed to chair/wheelchair/BSC Sit to Stand: Total assist   Squat pivot transfers: Max assist, +2 safety/equipment       General transfer comment: Squat pivot to the left      Balance Overall balance assessment: Needs assistance Sitting-balance support: Feet supported Sitting balance-Leahy Scale: Fair Sitting balance - Comments: No LOB with static sitting   Standing balance support: During functional activity, No upper extremity supported Standing balance-Leahy Scale: Zero Standing balance comment: total assist needed for standing balance                           ADL either performed or assessed with clinical judgement   ADL Overall ADL's : Needs assistance/impaired Eating/Feeding: Set up;Sitting   Grooming: Wash/dry hands;Wash/dry face;Sitting;Set up Grooming Details (indicate cue type and reason): simulated Upper Body Bathing: Minimal assistance;Sitting Upper Body Bathing Details (indicate cue type and reason): simulated Lower Body Bathing: +2 for safety/equipment;Maximal assistance;Sit to/from stand Lower Body Bathing Details (indicate cue type and reason): simulated Upper Body Dressing : Maximal assistance;Sitting Upper Body Dressing Details (indicate cue type and reason): donning  sling Lower Body Dressing: Maximal assistance;+2 for safety/equipment   Toilet Transfer: Total  assistance;Squat-pivot Toilet Transfer Details (indicate cue type and reason): simulated to bedside recliner Toileting- Clothing Manipulation and Hygiene: Maximal assistance       Functional mobility during ADLs: +2 for physical assistance;Maximal assistance (Squat pivot to bedside recliner) General ADL Comments: Pt needed total assist for sit to stand without assistive device but was unable to maintain.  Squat pivot transfer to the left was utilized for transfer OOB.  BP low at 82/59, 92/55, and 85/58 in sitting.  Pt asymptomatic throughout session.     Vision Baseline Vision/History: 1 Wears glasses (reading only) Ability to See in Adequate Light: 0 Adequate Patient Visual Report: No change from baseline Vision Assessment?: No apparent visual deficits     Perception  WFL   Praxis  [facility]    Pertinent Vitals/Pain Pain Assessment Pain Assessment: 0-10 Pain Score: 4  Pain Location: right hip Pain Descriptors / Indicators: Discomfort Pain Intervention(s): Limited activity within patient's tolerance, Repositioned     Hand Dominance Left   Extremity/Trunk Assessment Upper Extremity Assessment Upper Extremity Assessment: RUE deficits/detail RUE Deficits / Details: Pt currently in long arm splint secondary to olecranon fracture.  Able to demonstrate full flexion and extension of digits.  AAROM shoulder flexion 0-80 degrees RUE Sensation: WNL   Lower Extremity Assessment Lower Extremity Assessment: Defer to PT evaluation   Cervical / Trunk Assessment Cervical / Trunk Assessment: Normal   Communication Communication Communication: No difficulties   Cognition Arousal/Alertness: Awake/alert Behavior During Therapy: WFL for tasks assessed/performed Overall Cognitive Status: No family/caregiver present to determine baseline cognitive functioning                                                  Home Living Family/patient expects to be discharged to:: Private  residence Living Arrangements: Spouse/significant other;Children Available Help at Discharge: Family;Available PRN/intermittently (spouse unable to assist and children work) Type of Home: House Home Access: Stairs to enter [employer] of Steps: 5 Entrance Stairs-Rails: Can reach both Home Layout: One level     Bathroom Shower/Tub: Producer, television/film/video: Handicapped height     Home Equipment: Agricultural consultant (2 wheels);Toilet riser;Shower seat;Grab bars - tub/shower;Grab bars - toilet   Additional Comments: Reports that therapy was coming to his house up until the fall.      Prior Functioning/Environment               Mobility Comments: Used RW in the house and in the community as well ADLs Comments: Modified independent with selfcare        OT Problem List: Decreased strength;Decreased knowledge of use of DME or AE;Decreased knowledge of precautions;Decreased range of motion;Impaired UE functional use;Pain;Impaired balance (sitting and/or standing);Decreased safety awareness      OT Treatment/Interventions: Self-care/ADL training;Patient/family education;Therapeutic exercise;Balance training;Therapeutic activities;DME and/or AE instruction    OT Goals(Current goals can be found in the care plan section) Acute Rehab OT Goals Patient Stated Goal: Pt wants to get where he can go back home OT Goal Formulation: With patient Time For Goal Achievement: 07/19/22 Potential to Achieve Goals: Good  OT Frequency: Min 2X/week       AM-PAC OT "6 Clicks" Daily Activity     Outcome Measure Help from another person eating meals?: A Little Help from another person  taking care of personal grooming?: A Little Help from another person toileting, which includes using toliet, bedpan, or urinal?: Total Help from another person bathing (including washing, rinsing, drying)?: Total Help from another person to put on and taking off regular upper body clothing?: A  Lot Help from another person to put on and taking off regular lower body clothing?: Total 6 Click Score: 11   End of Session Equipment Utilized During Treatment: Other (comment) (sling) Nurse Communication: Mobility status;Other (comment) (need for lift)  Activity Tolerance: Patient tolerated treatment well Patient left: in chair;with call bell/phone within reach;with chair alarm set  OT Visit Diagnosis: Unsteadiness on feet (R26.81);Muscle weakness (generalized) (M62.81);Pain Pain - Right/Left: Right Pain - part of body: Hip                Time: 3474-2595 OT Time Calculation (min): 39 min Charges:  OT General Charges $OT Visit: 1 Visit OT Evaluation $OT Eval Moderate Complexity: 1 Mod OT Treatments $Self Care/Home Management : 38-52 mins Perrin Maltese, OTR/L Acute Rehabilitation Services  Office [phone] 07/05/2022

## 2022-07-05 NOTE — Evaluation (Signed)
Physical Therapy Evaluation Patient Details Name: Jacob [name] MRN: [id number] DOB: December 15, 1945 Today's Date: 07/05/2022  History of Present Illness  77 y.o. male presenting with fall 6/19, found to have R hip and Rt elbow fx. He is now s/p R hip repair 07/03/22. Non-op R olecranon fx splinted. NWB RUE with WBAT RLE. PMH of ESRD on HD, HTN, HLD, gout, hx prostate cancer s/p radiation.  Clinical Impression  Patient is s/p above surgery resulting in functional limitations due to the deficits listed below (see PT Problem List). Previously ambulatory with RW, hx of falls. Currently requires heavy Mod assist for bed mobility and transfers with a hemi-walker. Gross weakness preventing improved function with this assistive device, however as pain improves and pt has some further repetition with device I expect him to quickly reach min assist level with hemiwalker for transfers. Will continue to progress as tolerated. Pt without adequate support at home. Patient will benefit from continued inpatient follow up therapy, <3 hours/day. Patient will benefit from acute skilled PT to increase their independence and safety with mobility to facilitate discharge.        Recommendations for follow up therapy are one component of a multi-disciplinary discharge planning process, led by the attending physician.  Recommendations may be updated based on patient status, additional functional criteria and insurance authorization.  Follow Up Recommendations Can patient physically be transported by private vehicle: No (soon)     Assistance Recommended at Discharge Frequent or constant Supervision/Assistance  Patient can return home with the following  Two people to help with walking and/or transfers;A lot of help with bathing/dressing/bathroom;Assistance with cooking/housework;Assist for transportation;Help with stairs or ramp for entrance    Equipment Recommendations None recommended by PT (TBD next venue)   Recommendations for Other Services       Functional Status Assessment Patient has had a recent decline in their functional status and demonstrates the ability to make significant improvements in function in a reasonable and predictable amount of time.     Precautions / Restrictions Precautions Precautions: Fall Required Braces or Orthoses: Splint/Cast;Sling Splint/Cast: right long arm splint Restrictions Weight Bearing Restrictions: Yes RUE Weight Bearing: Non weight bearing RLE Weight Bearing: Weight bearing as tolerated      Mobility  Bed Mobility Overal bed mobility: Needs Assistance Bed Mobility: Supine to Sit     Supine to sit: HOB elevated, Mod assist     General bed mobility comments: Mod assist for RLE support and trunk to rise to EOB. Cues for technique, effortful.    Transfers Overall transfer level: Needs assistance Equipment used: Hemi-walker Transfers: Sit to/from Stand, Bed to chair/wheelchair/BSC Sit to Stand: Mod assist, From elevated surface Stand pivot transfers: Mod assist, From elevated surface         General transfer comment: Mod assist for boost to stand from elevated bed surface due to height. Cues for technique and hand placement. Leaning posteriorly. Minimal WB on RLE tolerated. Stable with min guard once upright using Hemiwalker for support. Performed 3x from bed with one pivot to recliner, up to mod assist for balance and to help sequence hemi walker. Pt with difficulty tol weight on RLE but did take one small hop on Lt. Reduced UE strength with Lt to stablize and fully utilize hemiwalker for better capabilities.    Ambulation/Gait                  Careers information officer  Modified Rankin (Stroke Patients Only)       Balance Overall balance assessment: Needs assistance Sitting-balance support: Feet supported Sitting balance-Leahy Scale: Fair Sitting balance - Comments: No LOB with static sitting    Standing balance support: During functional activity, No upper extremity supported Standing balance-Leahy Scale: Poor Standing balance comment: Min guard with hemi walker; intermittent assist.                             Pertinent Vitals/Pain Pain Assessment Pain Assessment: Faces Faces Pain Scale: Hurts even more Pain Location: right hip with WB Pain Descriptors / Indicators: Discomfort Pain Intervention(s): Monitored during session, Repositioned, Limited activity within patient's tolerance    Home Living Family/patient expects to be discharged to:: Private residence Living Arrangements: Spouse/significant other;Children Available Help at Discharge: Family;Available PRN/intermittently (spouse unable to assist and children work) Type of Home: House Home Access: Stairs to enter Entrance Stairs-Rails: Can reach both Entrance Stairs-Number of Steps: 5   Home Layout: One level Home Equipment: Agricultural consultant (2 wheels);Toilet riser;Shower seat;Grab bars - tub/shower;Grab bars - toilet Additional Comments: Reports that therapy was coming to his house up until the fall.    Prior Function Prior Level of Function : Needs assist             Mobility Comments: Used RW in the house and in the community as well ADLs Comments: Modified independent with selfcare     Hand Dominance   Dominant Hand: Left    Extremity/Trunk Assessment   Upper Extremity Assessment Upper Extremity Assessment: Defer to OT evaluation    Lower Extremity Assessment Lower Extremity Assessment: RLE deficits/detail;Generalized weakness RLE Deficits / Details: Gauze pads over incision. Guarded as expected post op.    Cervical / Trunk Assessment Cervical / Trunk Assessment: Normal  Communication   Communication: No difficulties  Cognition Arousal/Alertness: Awake/alert Behavior During Therapy: WFL for tasks assessed/performed Overall Cognitive Status: No family/caregiver present to  determine baseline cognitive functioning                                          General Comments General comments (skin integrity, edema, etc.): RUE elevated, RLE elevated. Cues for precautions.    Exercises     Assessment/Plan    PT Assessment Patient needs continued PT services  PT Problem List Decreased strength;Decreased range of motion;Decreased activity tolerance;Decreased balance;Decreased mobility;Decreased knowledge of use of DME;Decreased knowledge of precautions;Pain       PT Treatment Interventions DME instruction;Gait training;Functional mobility training;Therapeutic activities;Therapeutic exercise;Balance training;Neuromuscular re-education;Patient/family education;Modalities;Wheelchair mobility training    PT Goals (Current goals can be found in the Care Plan section)  Acute Rehab PT Goals Patient Stated Goal: Get well, get some rehab PT Goal Formulation: With patient Time For Goal Achievement: 07/19/22 Potential to Achieve Goals: Good    Frequency Min 3X/week     Co-evaluation               AM-PAC PT "6 Clicks" Mobility  Outcome Measure Help needed turning from your back to your side while in a flat bed without using bedrails?: A Lot Help needed moving from lying on your back to sitting on the side of a flat bed without using bedrails?: A Lot Help needed moving to and from a bed to a chair (including a wheelchair)?: A Lot Help needed standing up from  a chair using your arms (e.g., wheelchair or bedside chair)?: A Lot Help needed to walk in hospital room?: Total Help needed climbing 3-5 steps with a railing? : Total 6 Click Score: 10    End of Session Equipment Utilized During Treatment: Gait belt Activity Tolerance: Patient limited by pain Patient left: in chair;with call bell/phone within reach;with chair alarm set (hoyer pad in chair.) Nurse Communication: Mobility status;Need for lift equipment;Weight bearing status PT Visit  Diagnosis: Unsteadiness on feet (R26.81);Muscle weakness (generalized) (M62.81);History of falling (Z91.81);Difficulty in walking, not elsewhere classified (R26.2);Pain Pain - Right/Left: Right Pain - part of body: Hip;Arm    Time: 1610-9604 PT Time Calculation (min) (ACUTE ONLY): 21 min   Charges:   PT Evaluation $PT Eval Moderate Complexity: 1 Mod          Kathlyn [place], PT, DPT [facility]  Rehabilitation Services Physical Therapist Office: [phone] Website: [email or web address]   Berton Mount 07/05/2022, 4:32 PM

## 2022-07-05 NOTE — Progress Notes (Signed)
Orthopedic Tech Progress Note Patient Details:  Jacob [name] [birth year] [id number]  Posterior long arm splint with upper arm stirrup applied to RUE with Dr. Janee Morn to replace the initial splint. Ace wrap was applied from hand to elbow and then the entire splint was covered with a stockinette to allow easier access for hemodialysis.   Ortho Devices Type of Ortho Device: Post (long arm) splint, Stirrup splint (stirrup/U for upper arm) Ortho Device/Splint Location: RUE Ortho Device/Splint Interventions: Ordered, Application, Adjustment   Post Interventions Patient Tolerated: Well Instructions Provided: Care of device, Adjustment of device  Jacob [name] [name] 07/05/2022, 8:59 AM

## 2022-07-06 DIAGNOSIS — I10 Essential (primary) hypertension: Secondary | ICD-10-CM | POA: Diagnosis not present

## 2022-07-06 DIAGNOSIS — S72001P Fracture of unspecified part of neck of right femur, subsequent encounter for closed fracture with malunion: Secondary | ICD-10-CM | POA: Diagnosis not present

## 2022-07-06 DIAGNOSIS — N186 End stage renal disease: Secondary | ICD-10-CM | POA: Diagnosis not present

## 2022-07-06 DIAGNOSIS — F32A Depression, unspecified: Secondary | ICD-10-CM | POA: Diagnosis not present

## 2022-07-06 LAB — CBC
HCT: 22.1 % — ABNORMAL LOW (ref 39.0–52.0)
Hemoglobin: 7.3 g/dL — ABNORMAL LOW (ref 13.0–17.0)
MCH: 31.6 pg (ref 26.0–34.0)
MCHC: 33 g/dL (ref 30.0–36.0)
MCV: 95.7 fL (ref 80.0–100.0)
Platelets: 178 10*3/uL (ref 150–400)
RBC: 2.31 MIL/uL — ABNORMAL LOW (ref 4.22–5.81)
RDW: 14.8 % (ref 11.5–15.5)
WBC: 7.6 10*3/uL (ref 4.0–10.5)
nRBC: 0 % (ref 0.0–0.2)

## 2022-07-06 LAB — RENAL FUNCTION PANEL
Albumin: 2.2 g/dL — ABNORMAL LOW (ref 3.5–5.0)
Anion gap: 11 (ref 5–15)
BUN: 35 mg/dL — ABNORMAL HIGH (ref 8–23)
CO2: 28 mmol/L (ref 22–32)
Calcium: 8.2 mg/dL — ABNORMAL LOW (ref 8.9–10.3)
Chloride: 95 mmol/L — ABNORMAL LOW (ref 98–111)
Creatinine, Ser: 5.29 mg/dL — ABNORMAL HIGH (ref 0.61–1.24)
GFR, Estimated: 11 mL/min — ABNORMAL LOW (ref >60)
Glucose, Bld: 93 mg/dL (ref 70–99)
Phosphorus: 5 mg/dL — ABNORMAL HIGH (ref 2.5–4.6)
Potassium: 3.9 mmol/L (ref 3.5–5.1)
Sodium: 134 mmol/L — ABNORMAL LOW (ref 135–145)

## 2022-07-06 MED ORDER — SODIUM CHLORIDE 0.9 % IV SOLN
125.0000 mg | INTRAVENOUS | Status: DC
  Administered 2022-07-07 – 2022-07-09 (×2): 125 mg via INTRAVENOUS
  Filled 2022-07-06 (×2): qty 10

## 2022-07-06 MED ORDER — CHLORHEXIDINE GLUCONATE CLOTH 2 % EX PADS
6.0000 | MEDICATED_PAD | Freq: Every day | CUTANEOUS | Status: DC
  Administered 2022-07-06 – 2022-07-09 (×4): 6 via TOPICAL

## 2022-07-06 NOTE — Progress Notes (Signed)
     Jacob [name] is a 77 y.o. male   Orthopaedic diagnosis:Status post IM nail fixation of right intertrochanteric hip fracture 07/03/2022  Right minimally displaced transverse olecranon fracture  Subjective: Patient seen and examined.  He is eating breakfast.  He reports his pain is well-controlled.  Splint in the left arm appears to be well-fitted and preserving dialysis access.  He reports his pain is well-controlled at this point.  Nursing staff states he has not required IV Dilaudid recently.  He has been up with physical therapy.  Hemoglobin 7.3 today.  He says he feels well.  Objectyive: Vitals:   07/06/22 0539 07/06/22 0811  BP: 90/63 (!) 93/57  Pulse: 77 76  Resp: 16 15  Temp: 98.9 F (37.2 C) 98.5 F (36.9 C)  SpO2: 98% 99%     Exam: Awake and alert Respirations even and unlabored No acute distress  Examination of the right hip shows intact clean, and dry surgical dressing. The dressing was not removed. He tolerates gentle flexion of the knee. His calf is soft and nontender. He has fairly strength at the ankle. Endorses intact sensation to light touch distally. Warm well-perfused distally   Examination of the right upper extremity shows well-fitted right lower extremity splint.  Splint was not removed.  He is unable to range the elbow and splint now.  Dialysis access preserved.  Good range of motion, sensation, motor function distally in the hand and wrist.  Warm and well-perfused distally with intact sensation.  Assessment: Postop day 3 status post the above, doing well Right minimally displaced transverse olecranon fracture, being treated nonoperatively    Plan: Patient stable for discharge from an orthopedic standpoint once cleared by physical therapy and primary team.  -WBAT right lower extremity.  Nonweightbearing right upper extremity in splint -Heparin and SCDs for DVT prophylaxis -Pain control as needed.  Will discontinue IV Dilaudid as he is not  requiring it.  Plan for oxycodone at discharge.  Plan for outpatient follow-up 2 weeks from surgery with Dr. Susa Simmonds for repeat radiographs of the right elbow and hip, and staple removal if appropriate.   Elenora Hawbaker J. [place], PA-C

## 2022-07-06 NOTE — Progress Notes (Signed)
PROGRESS NOTE    Jacob [name]  ZOX:[id number] DOB: 1945/05/31 DOA: 07/02/2022 PCP: Ellyn Hack, MD    Chief Complaint  Patient presents with   Fall    Brief Narrative:  Patient pleasant 77 year old gentleman medical history significant of ESRD on HD Monday Wednesday Friday, hypertension, hyperlipidemia, prostate cancer status post radiation treatment, OSA on bedtime as needed O2 presented with a fall while walking up to walk away with his walker to hemodialysis.  Patient seen in the ED imaging done concerning for right hip fracture.  Patient seen by nephrology and underwent hemodialysis the evening of 07/02/2022.  Patient admitted, seen by orthopedics, underwent right hip fracture repair 07/03/2022.   Assessment & Plan:   Principal Problem:   Hip fracture (HCC) Active Problems:   Essential hypertension   Nocturnal hypoxemia   ESRD (end stage renal disease) on dialysis (HCC)   Mixed hyperlipidemia   Depression   Acute postoperative anemia due to expected blood loss   Closed fracture of right olecranon process   Benign prostatic hyperplasia   OSA (obstructive sleep apnea)   ESRD (end stage renal disease) (HCC)  #1 right intertrochanteric hip fracture/minimally displaced transverse fracture of the base of the olecranon on the right -Secondary to mechanical fall. -Patient seen in consultation by orthopedics and patient subsequently underwent operative fixation per orthopedics 07/03/2022. -Patient also noted with complaints of right elbow pain, plain films of the right elbow obtained showed a displaced transverse fracture of the base of the olecranon on the right.  Patient assessed by orthopedics and nonoperative management at this time. -WBAT right lower extremity, nonweightbearing right upper extremity in splint per orthopedics. -PT/OT. -Pain management, DVT prophylaxis per orthopedics.   2.  ESRD on HD MWF -Patient seen in consultation by nephrology on admission and  underwent hemodialysis on 07/02/2022 in preparation for operative fixation of right hip fracture 07/03/2022. -Postoperatively patient receiving hemodialysis. -Patient for hemodialysis tomorrow 07/07/2022 in house to ensure patient able to use AVF with right upper extremity splint in place. -Per nephrology.  3.  Hypertension -Blood pressure soft.   -On Bystolic with holding parameters.   -Discontinue verapamil.    4.  BPH/remote prostate cancer -Tamsulosin.    5.  Hyperlipidemia -Statin.    6.  OSA -Not on CPAP.   -Noted to wear nocturnal O2 about 2 nights per week.   7.  Mood disorder, dementia -Continue home regimen venlafaxine, donezepil, bupropion, melatonin.   -Delirium precautions.  8.  Postop acute blood loss anemia/folate deficiency -Hemoglobin currently at 7.3 from 8.5 from 7.0 from 9.5 on admission.. -Anemia panel with iron level of 28, TIBC of 253, folate of 4.8, ferritin of 178.   -Status post transfusion 1 unit packed red blood cells, hemoglobin currently stable.   -Continue folic acid 1 mg daily.    -Follow H&H.   -Transfusion threshold hemoglobin  < 7.   DVT prophylaxis: Heparin.  Code Status: Full Family Communication: Updated patient.  No family at bedside. Disposition: TBD.  SNF versus home with home health.  Status is: Inpatient Remains inpatient appropriate because: Severity of illness   Consultants:  Orthopedics: Earney Hamburg, PA 07/02/2022 Orthopedics: Dr. Susa Simmonds 07/03/2022 Nephrology: Dr. Signe Colt 07/02/2022  Procedures:  CT head 07/02/2022 CT C-spine 07/02/2022 Plain films of the right knee 07/02/2022 Chest x-ray 07/02/2022 Plain films of the right femur 07/03/2022 Plain films of the right elbow 07/03/2022 Plain films of the right hip and pelvis 07/02/2022 Transfuse 1 unit PRBC 07/04/2022 Plain films  of the right elbow 07/03/2022 Plain films of the left foot 07/04/2022  Antimicrobials:  Anti-infectives (From admission, onward)    Start     Dose/Rate  Route Frequency Ordered Stop   07/03/22 1700  ceFAZolin (ANCEF) IVPB 2g/100 mL premix        2 g 200 mL/hr over 30 Minutes Intravenous Every 12 hours 07/03/22 1331 07/04/22 1805   07/03/22 0945  ceFAZolin (ANCEF) IVPB 2g/100 mL premix        2 g 200 mL/hr over 30 Minutes Intravenous On call to O.R. 07/03/22 0931 07/03/22 1050         Subjective: Laying in bed.  States right elbow pain improving.  Right hip pain improving.  No chest pain.  No shortness of breath.  No abdominal pain.     Objective: Vitals:   07/05/22 2212 07/06/22 0539 07/06/22 0811 07/06/22 1307  BP: (!) 94/57 90/63 (!) 93/57 100/62  Pulse: 77 77 76 79  Resp: 16 16 15 17   Temp: 98.6 F (37 C) 98.9 F (37.2 C) 98.5 F (36.9 C) 98.3 F (36.8 C)  TempSrc: Oral  Oral   SpO2: 99% 98% 99% 100%  Weight:      Height:        Intake/Output Summary (Last 24 hours) at 07/06/2022 1418 Last data filed at 07/06/2022 1200 Gross per 24 hour  Intake 300 ml  Output 350 ml  Net -50 ml    Filed Weights   07/02/22 2258 07/03/22 0932 07/04/22 0924  Weight: 84.4 kg 84.4 kg 66.8 kg    Examination:  General exam: NAD. Respiratory system: CTAB.  No wheezes, no crackles, no rhonchi.  Fair air movement.  Speaking in full sentences.  Cardiovascular system: Regular rate rhythm no murmurs rubs or gallops.  No JVD.  No lower extremity edema.   Gastrointestinal system: Abdomen is soft, nontender, nondistended, positive bowel sounds.  No rebound.  No guarding. Central nervous system: Alert and oriented. No focal neurological deficits. Extremities: Right hip with postop dressing in place.  Right upper extremity in sling and cast.   Skin: No rashes, lesions or ulcers Psychiatry: Judgement and insight appear normal. Mood & affect appropriate.     Data Reviewed: I have personally reviewed following labs and imaging studies  CBC: Recent Labs  Lab 07/02/22 1339 07/02/22 1352 07/03/22 0104 07/04/22 0641 07/04/22 1730  07/05/22 0244 07/06/22 0047  WBC 8.1  --  10.3 10.4  --  7.9 7.6  NEUTROABS 6.2  --   --   --   --   --   --   HGB 9.2*   < > 8.7* 7.0* 8.6* 8.5* 7.3*  HCT 28.5*   < > 26.5* 21.7* 26.2* 26.2* 22.1*  MCV 100.0  --  98.9 96.4  --  94.9 95.7  PLT 187  --  187 178  --  162 178   < > = values in this interval not displayed.     Basic Metabolic Panel: Recent Labs  Lab 07/02/22 1339 07/02/22 1352 07/03/22 0104 07/04/22 0641 07/05/22 0244 07/06/22 0047  NA 137 135 135 133* 135 134*  K 4.0 3.9 3.9 4.5 4.0 3.9  CL 96* 100 95* 91* 97* 95*  CO2 28  --  29 27 27 28   GLUCOSE 96 95 102* 139* 99 93  BUN 36* 34* 13 39* 23 35*  CREATININE 6.41* 7.10* 3.48* 5.94* 3.80* 5.29*  CALCIUM 8.8*  --  8.7* 8.4* 7.9* 8.2*  PHOS  --   --   --  6.6* 3.5 5.0*     GFR: Estimated Creatinine Clearance: 11.2 mL/min (A) (by C-G formula based on SCr of 5.29 mg/dL (H)).  Liver Function Tests: Recent Labs  Lab 07/02/22 1339 07/04/22 0641 07/05/22 0244 07/06/22 0047  AST 13*  --   --   --   ALT 8  --   --   --   ALKPHOS 46  --   --   --   BILITOT 0.5  --   --   --   PROT 6.1*  --   --   --   ALBUMIN 2.8* 2.4* 2.4* 2.2*     CBG: No results for input(s): "GLUCAP" in the last 168 hours.   Recent Results (from the past 240 hour(s))  Surgical pcr screen     Status: None   Collection Time: 07/02/22  5:36 PM   Specimen: Nasal Mucosa; Nasal Swab  Result Value Ref Range Status   MRSA, PCR NEGATIVE NEGATIVE Final   Staphylococcus aureus NEGATIVE NEGATIVE Final    Comment: (NOTE) The Xpert SA Assay (FDA approved for NASAL specimens in patients 33 years of age and older), is one component of a comprehensive surveillance program. It is not intended to diagnose infection nor to guide or monitor treatment. Performed at [facility], [address], [place], [place] [address]          Radiology Studies: DG Foot Complete Left  Result Date: 07/04/2022 CLINICAL DATA:  Left toe pain EXAM:  LEFT FOOT - COMPLETE 3+ VIEW COMPARISON:  None Available. FINDINGS: Demineralization. Hallux valgus. No acute fracture or dislocation. Degenerative arthritis at the first MTP joint, the midfoot, and DIP joints. Soft tissues are radiographically unremarkable. IMPRESSION: 1. No acute fracture or dislocation. 2. Hallux valgus. Electronically Signed   By: Minerva Fester M.D.   On: 07/04/2022 19:28        Scheduled Meds:  sodium chloride   Intravenous Once   acetaminophen  650 mg Oral Once   allopurinol  150 mg Oral Daily   aspirin EC  81 mg Oral Daily   buPROPion  300 mg Oral Daily   calcitRIOL  0.5 mcg Oral Q M,W,F-HD   Chlorhexidine Gluconate Cloth  6 each Topical Q0600   Chlorhexidine Gluconate Cloth  6 each Topical Q0600   cinacalcet  30 mg Oral Q M,W,F-1800   darbepoetin (ARANESP) injection - DIALYSIS  100 mcg Subcutaneous Q Fri-1800   donepezil  5 mg Oral QHS   folic acid  1 mg Oral Daily   heparin injection (subcutaneous)  5,000 Units Subcutaneous Q8H   loratadine  10 mg Oral Daily   melatonin  10 mg Oral QHS   multivitamin  1 tablet Oral QHS   nebivolol  20 mg Oral QHS   pantoprazole  40 mg Oral QAC supper   polyethylene glycol  17 g Oral BID   rosuvastatin  5 mg Oral Daily   senna-docusate  1 tablet Oral BID   sevelamer carbonate  800 mg Oral TID WC   tamsulosin  0.4 mg Oral Daily   venlafaxine XR  75 mg Oral Daily   Continuous Infusions:  [START ON 07/07/2022] ferric gluconate (FERRLECIT) IVPB     methocarbamol (ROBAXIN) IV       LOS: 4 days    Time spent: 35 minutes    Ramiro Harvest, MD [facility]   To contact the attending provider between 7A-7P or the covering provider during after hours 7P-7A, please log  into the web site [email or web address] and access using universal [place] password for that web site. If you do not have the password, please call the hospital operator.  07/06/2022, 2:18 PM

## 2022-07-06 NOTE — Progress Notes (Signed)
[facility] Progress Note   Subjective:   Patient seen and examined at bedside. Reports pain in leg with movement. R arm feeling a little better this AM.  Denies CP, SOB, abdominal pain and n/v/d.   Objective Vitals:   07/05/22 1849 07/05/22 2212 07/06/22 0539 07/06/22 0811  BP: (!) 91/58 (!) 94/57 90/63 (!) 93/57  Pulse:  77 77 76  Resp:  16 16 15   Temp:  98.6 F (37 C) 98.9 F (37.2 C) 98.5 F (36.9 C)  TempSrc:  Oral  Oral  SpO2:  99% 98% 99%  Weight:      Height:       Physical Exam General:well appearing, elderly male in NAD Heart:RRR, no mrg Lungs:CTAB, nml WOB on RA Abdomen:soft, NTND Extremities:no LE edema, R arm in splint Dialysis Access: RU AVF +b/t   Filed Weights   07/02/22 2258 07/03/22 0932 07/04/22 0924  Weight: 84.4 kg 84.4 kg 66.8 kg    Intake/Output Summary (Last 24 hours) at 07/06/2022 0812 Last data filed at 07/05/2022 2212 Gross per 24 hour  Intake --  Output 350 ml  Net -350 ml    Additional Objective Labs: Basic Metabolic Panel: Recent Labs  Lab 07/04/22 0641 07/05/22 0244 07/06/22 0047  NA 133* 135 134*  K 4.5 4.0 3.9  CL 91* 97* 95*  CO2 27 27 28   GLUCOSE 139* 99 93  BUN 39* 23 35*  CREATININE 5.94* 3.80* 5.29*  CALCIUM 8.4* 7.9* 8.2*  PHOS 6.6* 3.5 5.0*   Liver Function Tests: Recent Labs  Lab 07/02/22 1339 07/04/22 0641 07/05/22 0244 07/06/22 0047  AST 13*  --   --   --   ALT 8  --   --   --   ALKPHOS 46  --   --   --   BILITOT 0.5  --   --   --   PROT 6.1*  --   --   --   ALBUMIN 2.8* 2.4* 2.4* 2.2*   CBC: Recent Labs  Lab 07/02/22 1339 07/02/22 1352 07/03/22 0104 07/04/22 0641 07/04/22 1730 07/05/22 0244 07/06/22 0047  WBC 8.1  --  10.3 10.4  --  7.9 7.6  NEUTROABS 6.2  --   --   --   --   --   --   HGB 9.2*   < > 8.7* 7.0* 8.6* 8.5* 7.3*  HCT 28.5*   < > 26.5* 21.7* 26.2* 26.2* 22.1*  MCV 100.0  --  98.9 96.4  --  94.9 95.7  PLT 187  --  187 178  --  162 178   < > = values in this  interval not displayed.   Blood Culture    Component Value Date/Time   SDES BLOOD LEFT HAND 09/09/2018 1122   SPECREQUEST  09/09/2018 1122    BOTTLES DRAWN AEROBIC ONLY Blood Culture results may not be optimal due to an inadequate volume of blood received in culture bottles   CULT  09/09/2018 1122    NO GROWTH 5 DAYS Performed at [facility], [address], [place], [place] [address]    REPTSTATUS 09/14/2018 FINAL 09/09/2018 1122   Iron Studies:  Recent Labs    07/04/22 1730  IRON 28*  TIBC 253  FERRITIN 178   Lab Results  Component Value Date   INR 1.1 02/21/2022   INR 1.1 02/16/2021   INR 1.1 05/11/2020   Studies/Results: DG Foot Complete Left  Result Date: 07/04/2022 CLINICAL DATA:  Left toe pain EXAM: LEFT FOOT - COMPLETE 3+ VIEW COMPARISON:  None Available. FINDINGS: Demineralization. Hallux valgus. No acute fracture or dislocation. Degenerative arthritis at the first MTP joint, the midfoot, and DIP joints. Soft tissues are radiographically unremarkable. IMPRESSION: 1. No acute fracture or dislocation. 2. Hallux valgus. Electronically Signed   By: Minerva Fester M.D.   On: 07/04/2022 19:28    Medications:  methocarbamol (ROBAXIN) IV      sodium chloride   Intravenous Once   acetaminophen  650 mg Oral Once   allopurinol  150 mg Oral Daily   aspirin EC  81 mg Oral Daily   buPROPion  300 mg Oral Daily   calcitRIOL  0.5 mcg Oral Q M,W,F-HD   Chlorhexidine Gluconate Cloth  6 each Topical Q0600   cinacalcet  30 mg Oral Q M,W,F-1800   darbepoetin (ARANESP) injection - DIALYSIS  100 mcg Subcutaneous Q Fri-1800   donepezil  5 mg Oral QHS   folic acid  1 mg Oral Daily   heparin injection (subcutaneous)  5,000 Units Subcutaneous Q8H   loratadine  10 mg Oral Daily   melatonin  10 mg Oral QHS   multivitamin  1 tablet Oral QHS   nebivolol  20 mg Oral QHS   pantoprazole  40 mg Oral QAC supper   polyethylene glycol  17 g Oral BID   rosuvastatin  5 mg Oral Daily    senna-docusate  1 tablet Oral BID   sevelamer carbonate  800 mg Oral TID WC   tamsulosin  0.4 mg Oral Daily   venlafaxine XR  75 mg Oral Daily   verapamil  80 mg Oral TID    Dialysis Orders: [facility] MWF 4:00  400/A1.5x EDW 86kg 2K/2Ca AVG  Heparin 1500 Mircera 75 q 2 wks -last 6/12 Calcitriol 0.5 TIW, Sensipar 30 TIW   Assessment/Plan:  R hip fracture d/t mechanical fall - Ortho consulted. S/p repair 07/03/22 R olecranon Frx - splint in place.    ESRD -  HD MWF. Continue on schedule. Next HD 6/24 in hospital to ensure able to use AVF with splint in place. Expect they should not have an issue.  Hypertension/volume  - BP remains soft today without antihypertensive in last 24hours.  Holding parameters in place on meds.  Volume ok. UF 1 L last HD.  Anemia  - Hgb 7.3. on ESA, next due 2/26. 1 unit pRBC given 6/21. Tsat 11%, start IV iron.  Follow trends  Metabolic bone disease - Corrected Ca and phos in goal. Continue binders/calcitriol/Sensipar.  Nutrition - Renal diet/Prot supp  Virgina Norfolk, PA-C [facility] 07/06/2022,8:12 AM  LOS: 4 days

## 2022-07-07 DIAGNOSIS — I10 Essential (primary) hypertension: Secondary | ICD-10-CM | POA: Diagnosis not present

## 2022-07-07 DIAGNOSIS — N186 End stage renal disease: Secondary | ICD-10-CM | POA: Diagnosis not present

## 2022-07-07 DIAGNOSIS — F32A Depression, unspecified: Secondary | ICD-10-CM | POA: Diagnosis not present

## 2022-07-07 DIAGNOSIS — S72001P Fracture of unspecified part of neck of right femur, subsequent encounter for closed fracture with malunion: Secondary | ICD-10-CM | POA: Diagnosis not present

## 2022-07-07 LAB — RENAL FUNCTION PANEL
Albumin: 2.2 g/dL — ABNORMAL LOW (ref 3.5–5.0)
Anion gap: 13 (ref 5–15)
BUN: 49 mg/dL — ABNORMAL HIGH (ref 8–23)
CO2: 25 mmol/L (ref 22–32)
Calcium: 8.6 mg/dL — ABNORMAL LOW (ref 8.9–10.3)
Chloride: 97 mmol/L — ABNORMAL LOW (ref 98–111)
Creatinine, Ser: 6.74 mg/dL — ABNORMAL HIGH (ref 0.61–1.24)
GFR, Estimated: 8 mL/min — ABNORMAL LOW (ref >60)
Glucose, Bld: 124 mg/dL — ABNORMAL HIGH (ref 70–99)
Phosphorus: 4.9 mg/dL — ABNORMAL HIGH (ref 2.5–4.6)
Potassium: 3.6 mmol/L (ref 3.5–5.1)
Sodium: 135 mmol/L (ref 135–145)

## 2022-07-07 LAB — CBC
HCT: 21.7 % — ABNORMAL LOW (ref 39.0–52.0)
Hemoglobin: 7 g/dL — ABNORMAL LOW (ref 13.0–17.0)
MCH: 30.6 pg (ref 26.0–34.0)
MCHC: 32.3 g/dL (ref 30.0–36.0)
MCV: 94.8 fL (ref 80.0–100.0)
Platelets: 212 10*3/uL (ref 150–400)
RBC: 2.29 MIL/uL — ABNORMAL LOW (ref 4.22–5.81)
RDW: 14.6 % (ref 11.5–15.5)
WBC: 7.3 10*3/uL (ref 4.0–10.5)
nRBC: 0 % (ref 0.0–0.2)

## 2022-07-07 MED ORDER — HEPARIN SODIUM (PORCINE) 1000 UNIT/ML DIALYSIS: 1000.0000 [IU] | INTRAMUSCULAR | Status: DC | PRN

## 2022-07-07 MED ORDER — ANTICOAGULANT SODIUM CITRATE 4% (200MG/5ML) IV SOLN
5.0000 mL | Status: DC | PRN
Start: 2022-07-07 — End: 2022-07-07

## 2022-07-07 MED ORDER — HEPARIN SODIUM (PORCINE) 1000 UNIT/ML DIALYSIS
1500.0000 [IU] | INTRAMUSCULAR | Status: DC | PRN
  Administered 2022-07-07: 1500 [IU] via INTRAVENOUS_CENTRAL

## 2022-07-07 MED ORDER — LIDOCAINE-PRILOCAINE 2.5-2.5 % EX CREA: 1.0000 | TOPICAL_CREAM | CUTANEOUS | Status: DC | PRN

## 2022-07-07 MED ORDER — LIDOCAINE HCL (PF) 1 % IJ SOLN
5.0000 mL | INTRAMUSCULAR | Status: DC | PRN
Start: 2022-07-07 — End: 2022-07-07

## 2022-07-07 MED ORDER — ALTEPLASE 2 MG IJ SOLR: 2.0000 mg | Freq: Once | INTRAMUSCULAR | Status: DC | PRN

## 2022-07-07 MED ORDER — PENTAFLUOROPROP-TETRAFLUOROETH EX AERO: 1.0000 | INHALATION_SPRAY | CUTANEOUS | Status: DC | PRN

## 2022-07-07 MED ORDER — ANTICOAGULANT SODIUM CITRATE 4% (200MG/5ML) IV SOLN
5.0000 mL | Status: DC | PRN
  Filled 2022-07-07: qty 5

## 2022-07-07 MED ORDER — HEPARIN SODIUM (PORCINE) 1000 UNIT/ML IJ SOLN
INTRAMUSCULAR | Status: AC
  Filled 2022-07-07: qty 2

## 2022-07-07 MED ORDER — ALTEPLASE 2 MG IJ SOLR
2.0000 mg | Freq: Once | INTRAMUSCULAR | Status: DC | PRN
Start: 2022-07-07 — End: 2022-07-07

## 2022-07-07 MED ORDER — PENTAFLUOROPROP-TETRAFLUOROETH EX AERO
1.0000 | INHALATION_SPRAY | CUTANEOUS | Status: DC | PRN
Start: 2022-07-07 — End: 2022-07-07

## 2022-07-07 MED ORDER — LIDOCAINE HCL (PF) 1 % IJ SOLN: 5.0000 mL | INTRAMUSCULAR | Status: DC | PRN

## 2022-07-07 MED ORDER — HEPARIN SODIUM (PORCINE) 1000 UNIT/ML DIALYSIS
1000.0000 [IU] | INTRAMUSCULAR | Status: DC | PRN
Start: 2022-07-07 — End: 2022-07-07

## 2022-07-07 MED ORDER — ALBUMIN HUMAN 25 % IV SOLN
INTRAVENOUS | Status: AC
  Administered 2022-07-07: 25 g
  Filled 2022-07-07: qty 100

## 2022-07-07 NOTE — Progress Notes (Signed)
Pt. Off the unit to HD

## 2022-07-07 NOTE — Progress Notes (Signed)
PROGRESS NOTE    Jacob [name]  WGN:[id number] DOB: 02-Dec-1945 DOA: 07/02/2022 PCP: Ellyn Hack, MD    Chief Complaint  Patient presents with   Fall    Brief Narrative:  Patient pleasant 77 year old gentleman medical history significant of ESRD on HD Monday Wednesday Friday, hypertension, hyperlipidemia, prostate cancer status post radiation treatment, OSA on bedtime as needed O2 presented with a fall while walking up to walk away with his walker to hemodialysis.  Patient seen in the ED imaging done concerning for right hip fracture.  Patient seen by nephrology and underwent hemodialysis the evening of 07/02/2022.  Patient admitted, seen by orthopedics, underwent right hip fracture repair 07/03/2022.   Assessment & Plan:   Principal Problem:   Hip fracture (HCC) Active Problems:   Essential hypertension   Nocturnal hypoxemia   ESRD (end stage renal disease) on dialysis (HCC)   Mixed hyperlipidemia   Depression   Acute postoperative anemia due to expected blood loss   Closed fracture of right olecranon process   Benign prostatic hyperplasia   OSA (obstructive sleep apnea)   ESRD (end stage renal disease) (HCC)  #1 right intertrochanteric hip fracture/minimally displaced transverse fracture of the base of the olecranon on the right -Secondary to mechanical fall. -Patient seen in consultation by orthopedics and patient subsequently underwent operative fixation per orthopedics 07/03/2022. -Patient also noted with complaints of right elbow pain, plain films of the right elbow obtained showed a displaced transverse fracture of the base of the olecranon on the right.  Patient assessed by orthopedics and nonoperative management at this time. -WBAT right lower extremity, nonweightbearing right upper extremity in splint per orthopedics. -PT/OT. -Pain management, DVT prophylaxis per orthopedics. -Outpatient follow-up with orthopedics.   2.  ESRD on HD MWF -Patient seen in  consultation by nephrology on admission and underwent hemodialysis on 07/02/2022 in preparation for operative fixation of right hip fracture 07/03/2022. -Postoperatively patient receiving hemodialysis. -Patient in hemodialysis today, 07/07/2022 in house to ensure patient able to use AVF with right upper extremity splint in place. -Patient seems to be tolerating hemodialysis today. -Per nephrology.  3.  Hypertension -Blood pressure soft.   -On Bystolic with holding parameters.   -Verapamil discontinued.    4.  BPH/remote prostate cancer -Tamsulosin.    5.  Hyperlipidemia -Statin.  6.  OSA -Not on CPAP.   -Noted to wear nocturnal O2 about 2 nights per week.   7.  Mood disorder, dementia -Continue venlafaxine, donezepil, bupropion, melatonin.   -Delirium precautions.    8.  Postop acute blood loss anemia/folate deficiency -Hemoglobin currently at 7.0 from 7.3 from 8.5 from 7.0 from 9.5 on admission.. -Anemia panel with iron level of 28, TIBC of 253, folate of 4.8, ferritin of 178.   -Status post transfusion 1 unit packed red blood cells.  -Continue folic acid 1 mg daily.    -Follow H&H.   -Transfusion threshold hemoglobin  < 7.   DVT prophylaxis: Heparin/SCDs inpatient, on discharge aspirin 325 mg daily x 1 month per orthopedics. Code Status: Full Family Communication: Updated patient.  No family at bedside. Disposition: SNF when bed available.  Hopefully in the next 24 hours.  Patient medically stable.   Status is: Inpatient Remains inpatient appropriate because: Severity of illness   Consultants:  Orthopedics: Earney Hamburg, PA 07/02/2022 Orthopedics: Dr. Susa Simmonds 07/03/2022 Nephrology: Dr. Signe Colt 07/02/2022  Procedures:  CT head 07/02/2022 CT C-spine 07/02/2022 Plain films of the right knee 07/02/2022 Chest x-ray 07/02/2022 Plain films of the  right femur 07/03/2022 Plain films of the right elbow 07/03/2022 Plain films of the right hip and pelvis 07/02/2022 Transfuse 1 unit  PRBC 07/04/2022 Plain films of the right elbow 07/03/2022 Plain films of the left foot 07/04/2022  Antimicrobials:  Anti-infectives (From admission, onward)    Start     Dose/Rate Route Frequency Ordered Stop   07/03/22 1700  ceFAZolin (ANCEF) IVPB 2g/100 mL premix        2 g 200 mL/hr over 30 Minutes Intravenous Every 12 hours 07/03/22 1331 07/04/22 1805   07/03/22 0945  ceFAZolin (ANCEF) IVPB 2g/100 mL premix        2 g 200 mL/hr over 30 Minutes Intravenous On call to O.R. 07/03/22 0931 07/03/22 1050         Subjective: In hemodialysis.  Right hip and right elbow pain improving.  No complaints.    Objective: Vitals:   07/07/22 0902 07/07/22 0937 07/07/22 1000 07/07/22 1030  BP: 104/63 102/61 95/62 98/66   Pulse: 73 71 70 70  Resp: 14 16 17 17   Temp:      TempSrc:      SpO2: 100% 96% 100% 100%  Weight:      Height:        Intake/Output Summary (Last 24 hours) at 07/07/2022 1045 Last data filed at 07/07/2022 0459 Gross per 24 hour  Intake 300 ml  Output 800 ml  Net -500 ml    Filed Weights   07/03/22 0932 07/04/22 0924 07/07/22 0820  Weight: 84.4 kg 66.8 kg 67.7 kg    Examination:  General exam: NAD. Respiratory system: CTAB anterior lung fields.  No wheezes, no crackles, no rhonchi.  Fair air movement.  Speaking in full sentences.  Cardiovascular system: RRR no murmurs rubs or gallops.  No JVD.  No lower extremity edema.    Gastrointestinal system: Abdomen soft, nontender, nondistended, positive bowel sounds.  No rebound.  No guarding.  Central nervous system: Alert and oriented. No focal neurological deficits. Extremities: Right hip with postop dressing in place.  Right upper extremity in sling and cast.   Skin: No rashes, lesions or ulcers Psychiatry: Judgement and insight appear normal. Mood & affect appropriate.     Data Reviewed: I have personally reviewed following labs and imaging studies  CBC: Recent Labs  Lab 07/02/22 1339 07/02/22 1352  07/03/22 0104 07/04/22 0641 07/04/22 1730 07/05/22 0244 07/06/22 0047 07/07/22 0500  WBC 8.1  --  10.3 10.4  --  7.9 7.6 7.3  NEUTROABS 6.2  --   --   --   --   --   --   --   HGB 9.2*   < > 8.7* 7.0* 8.6* 8.5* 7.3* 7.0*  HCT 28.5*   < > 26.5* 21.7* 26.2* 26.2* 22.1* 21.7*  MCV 100.0  --  98.9 96.4  --  94.9 95.7 94.8  PLT 187  --  187 178  --  162 178 212   < > = values in this interval not displayed.     Basic Metabolic Panel: Recent Labs  Lab 07/03/22 0104 07/04/22 0641 07/05/22 0244 07/06/22 0047 07/07/22 0736  NA 135 133* 135 134* 135  K 3.9 4.5 4.0 3.9 3.6  CL 95* 91* 97* 95* 97*  CO2 29 27 27 28 25   GLUCOSE 102* 139* 99 93 124*  BUN 13 39* 23 35* 49*  CREATININE 3.48* 5.94* 3.80* 5.29* 6.74*  CALCIUM 8.7* 8.4* 7.9* 8.2* 8.6*  PHOS  --  6.6* 3.5  5.0* 4.9*     GFR: Estimated Creatinine Clearance: 8.9 mL/min (A) (by C-G formula based on SCr of 6.74 mg/dL (H)).  Liver Function Tests: Recent Labs  Lab 07/02/22 1339 07/04/22 0641 07/05/22 0244 07/06/22 0047 07/07/22 0736  AST 13*  --   --   --   --   ALT 8  --   --   --   --   ALKPHOS 46  --   --   --   --   BILITOT 0.5  --   --   --   --   PROT 6.1*  --   --   --   --   ALBUMIN 2.8* 2.4* 2.4* 2.2* 2.2*     CBG: No results for input(s): "GLUCAP" in the last 168 hours.   Recent Results (from the past 240 hour(s))  Surgical pcr screen     Status: None   Collection Time: 07/02/22  5:36 PM   Specimen: Nasal Mucosa; Nasal Swab  Result Value Ref Range Status   MRSA, PCR NEGATIVE NEGATIVE Final   Staphylococcus aureus NEGATIVE NEGATIVE Final    Comment: (NOTE) The Xpert SA Assay (FDA approved for NASAL specimens in patients 76 years of age and older), is one component of a comprehensive surveillance program. It is not intended to diagnose infection nor to guide or monitor treatment. Performed at [facility], [address], [place], [place] [address]          Radiology Studies: No  results found.      Scheduled Meds:  sodium chloride   Intravenous Once   acetaminophen  650 mg Oral Once   allopurinol  150 mg Oral Daily   aspirin EC  81 mg Oral Daily   buPROPion  300 mg Oral Daily   calcitRIOL  0.5 mcg Oral Q M,W,F-HD   Chlorhexidine Gluconate Cloth  6 each Topical Q0600   Chlorhexidine Gluconate Cloth  6 each Topical Q0600   cinacalcet  30 mg Oral Q M,W,F-1800   darbepoetin (ARANESP) injection - DIALYSIS  100 mcg Subcutaneous Q Fri-1800   donepezil  5 mg Oral QHS   folic acid  1 mg Oral Daily   heparin injection (subcutaneous)  5,000 Units Subcutaneous Q8H   heparin sodium (porcine)       loratadine  10 mg Oral Daily   melatonin  10 mg Oral QHS   multivitamin  1 tablet Oral QHS   nebivolol  20 mg Oral QHS   pantoprazole  40 mg Oral QAC supper   polyethylene glycol  17 g Oral BID   rosuvastatin  5 mg Oral Daily   senna-docusate  1 tablet Oral BID   sevelamer carbonate  800 mg Oral TID WC   tamsulosin  0.4 mg Oral Daily   venlafaxine XR  75 mg Oral Daily   Continuous Infusions:  anticoagulant sodium citrate     ferric gluconate (FERRLECIT) IVPB     methocarbamol (ROBAXIN) IV       LOS: 5 days    Time spent: 35 minutes    Ramiro Harvest, MD [facility]   To contact the attending provider between 7A-7P or the covering provider during after hours 7P-7A, please log into the web site [email or web address] and access using universal [place] password for that web site. If you do not have the password, please call the hospital operator.  07/07/2022, 10:45 AM

## 2022-07-07 NOTE — TOC Progression Note (Signed)
Transition of Care [facility]) - Progression Note    Patient Details  Name: Kshawn Canal MRN: [id number] Date of Birth: 17-May-1945  Transition of Care [facility]) CM/SW Contact  Lorri Frederick, LCSW Phone Number: 07/07/2022, 2:45 PM  Clinical Narrative:   CSW spoke with pt regarding PT recommendation for SNF.  Pt agreeable to this, medicare choice document provided, permission given to send out referral in hub.    Expected Discharge Plan: Skilled Nursing Facility Barriers to Discharge: SNF Pending bed offer  Expected Discharge Plan and Services In-house Referral: Clinical Social Work   Post Acute Care Choice: Skilled Nursing Facility Living arrangements for the past 2 months: Single Family Home                                       Social Determinants of Health (SDOH) Interventions SDOH Screenings   Food Insecurity: No Food Insecurity (07/02/2022)  Housing: Low Risk  (07/02/2022)  Transportation Needs: No Transportation Needs (07/02/2022)  Utilities: Not At Risk (07/02/2022)  Tobacco Use: Medium Risk (07/03/2022)    Readmission Risk Interventions    12/22/2019    9:46 AM  Readmission Risk Prevention Plan  Transportation Screening Complete  PCP or Specialist Appt within 3-5 Days Not Complete  Not Complete comments first available appt on 12/23  HRI or Home Care Consult Complete  Social Work Consult for Recovery Care Planning/Counseling Complete  Palliative Care Screening Not Applicable

## 2022-07-07 NOTE — Progress Notes (Signed)
     Jacob [name] is a 77 y.o. male   Orthopaedic diagnosis:Status post IM nail fixation of right intertrochanteric hip fracture 07/03/2022  Right minimally displaced transverse olecranon fracture  Subjective: Patient seen and evaluated this morning while undergoing dialysis. Able to access right upper arm site through splint well. Reports pain is controlled on current regimen. He does state his dressing is beginning to come off on the left hip.  No new complaints today.  Objectyive: Vitals:   07/07/22 1138 07/07/22 1203  BP: 100/63 103/62  Pulse: 66 69  Resp: 14 13  Temp:    SpO2: 100% 99%     Exam: Awake and alert Respirations even and unlabored No acute distress  Examination of the right hip shows intact clean, and dry surgical dressing. ABDs peeling back some. The dressing was not removed. He tolerates gentle flexion of the knee. His calf is soft and nontender. He has fairly strength at the ankle. Endorses intact sensation to light touch distally. Warm well-perfused distally    Examination of the right upper extremity shows well-fitted right lower extremity splint.  Splint Slightly undressed for dialysis access. Good range of motion, sensation, motor function distally in the hand and wrist.  Warm and well-perfused distally with intact sensation.  Assessment: Postop day  status post the above, doing well Right minimally displaced transverse olecranon fracture, being treated nonoperatively   Plan: Patient stable for discharge from an orthopedic standpoint once cleared by physical therapy and primary team.  We will ask nursing staff to change hip dressing as it is coming off when he is back from dialysis.   -WBAT right lower extremity.  Nonweightbearing right upper extremity in splint -Heparin and SCDs for DVT prophylaxis while inpatient.  Plan to discharge on 325 aspirin daily 1 month for DVT prophylaxis outpatient if other teams are agreeable. -Pain control as needed.   Plan for oxycodone at discharge.   Plan for outpatient follow-up 2 weeks from surgery with Dr. Susa Simmonds for repeat radiographs of the right elbow and hip, and staple removal if appropriate.   Jacob [name] J. [place], PA-C

## 2022-07-07 NOTE — NC FL2 (Addendum)
MEDICAID FL2 LEVEL OF CARE FORM     IDENTIFICATION  Patient Name: Jacob [name] Birthdate: 11-04-1945 Sex: male Admission Date (Current Location): 07/02/2022  [facility] and [place] Number:  Producer, television/film/video and Address:  [facility] [place], [address], [place], [place] [address]      Provider Number: [id number]  Attending Physician Name and Address:  Rodolph Bong, MD  Relative Name and Phone Number:  Tome, Wilson [phone]    Current Level of Care: Hospital Recommended Level of Care: Skilled Nursing Facility Prior Approval Number:    Date Approved/Denied:   PASRR Number: [id number]  Discharge Plan: SNF    Current Diagnoses: Patient Active Problem List   Diagnosis Date Noted   Acute postoperative anemia due to expected blood loss 07/04/2022   Closed fracture of right olecranon process 07/04/2022   Benign prostatic hyperplasia 07/04/2022   OSA (obstructive sleep apnea) 07/04/2022   ESRD (end stage renal disease) (HCC) 07/04/2022   Hip fracture (HCC) 07/02/2022   Primary open angle glaucoma 11/18/2021   Venous (peripheral) insufficiency 11/18/2021   Mixed hyperlipidemia 03/28/2021   Stroke (HCC) 02/16/2021   Abnormal stress test 01/10/2021   Obesity (BMI 30-39.9) 01/10/2021   Other chest pain 11/23/2020   Hypertension 11/23/2020   Murmur 11/23/2020   Dizziness 11/23/2020   SOB (shortness of breath) 11/23/2020   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   Stooped posture 08/13/2020   Multiple myeloma (HCC) 08/13/2020   Depression 05/27/2020   Nausea 05/27/2020   Pain 05/27/2020   Hyperkalemia 12/20/2019   History of stomach cancer 12/20/2019   Metabolic acidosis 12/20/2019   Hypoglycemia due to insulin 12/20/2019   Hypertensive urgency 12/20/2019   Personal history of colonic polyps    Diverticulosis of colon without hemorrhage    Multiple polyps of sigmoid colon     AKI (acute kidney injury) (HCC) 09/15/2018   ESRD (end stage renal disease) on dialysis (HCC) 09/15/2018   Pneumonia 09/09/2018   Acute renal failure superimposed on stage 3 chronic kidney disease (HCC) 09/09/2018   SBO (small bowel obstruction) (HCC) 09/09/2018   Port-A-Cath in place 11/23/2017   Post-operative nausea and vomiting 04/09/2016   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   Genetic testing 12/19/2015   Stomach cancer (HCC)    Gastric adenocarcinoma (HCC)    Gastric mass    GI bleed 10/26/2015   Gastrointestinal hemorrhage    PUD (peptic ulcer disease)    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   Nocturnal hypoxemia 11/04/2013   Noncompliance with treatment 09/02/2013   Chronic kidney disease, stage II (mild) 08/30/2013   Morbid obesity (HCC) 07/26/2013   Fatigue 07/26/2013   Prostate cancer (HCC) 08/14/2011   Iron deficiency anemia 03/19/2007   DEPRESSION 03/19/2007   Essential hypertension 03/19/2007   NEOPLASM, BENIGN, STOMACH 11/23/2006   POLYP, COLON 11/23/2006   Grade I internal hemorrhoids 11/23/2006   GASTRITIS 11/23/2006    Orientation RESPIRATION BLADDER Height & Weight     Self, Time, Situation, Place  Normal Continent Weight: 145 lb 11.6 oz (66.1 kg) Height:  6\' 2"  (188 cm)  BEHAVIORAL SYMPTOMS/MOOD NEUROLOGICAL BOWEL NUTRITION STATUS      Continent Diet (see discharge summary)  AMBULATORY STATUS COMMUNICATION OF NEEDS Skin   Total Care Verbally Surgical wounds                       Personal Care Assistance Level  of Assistance  Bathing, Feeding, Dressing Bathing Assistance: Maximum assistance Feeding assistance: Limited assistance Dressing Assistance: Maximum assistance     Functional Limitations Info  Sight, Hearing, Speech Sight Info: Adequate Hearing Info: Adequate Speech Info: Adequate    SPECIAL CARE FACTORS FREQUENCY  PT (By licensed PT), OT (By licensed OT)     PT Frequency: 5x week OT Frequency: 5x  week            Contractures Contractures Info: Not present    Additional Factors Info  Code Status, Allergies Code Status Info: full Allergies Info: lisinopril           Current Medications (07/07/2022):  This is the current hospital active medication list Current Facility-Administered Medications  Medication Dose Route Frequency Provider Last Rate Last Admin   0.9 %  sodium chloride infusion (Manually program via Guardrails IV Fluids)   Intravenous Once Rodolph Bong, MD       acetaminophen (TYLENOL) tablet 650 mg  650 mg Oral Q6H PRN [place], Jesse J, PA-C   650 mg at 07/03/22 2042   Or   acetaminophen (TYLENOL) suppository 650 mg  650 mg Rectal Q6H PRN [place], Jesse J, PA-C       acetaminophen (TYLENOL) tablet 650 mg  650 mg Oral Once Rodolph Bong, MD       allopurinol (ZYLOPRIM) tablet 150 mg  150 mg Oral Daily [place], Jesse J, PA-C   150 mg at 07/07/22 1348   aspirin EC tablet 81 mg  81 mg Oral Daily [place], Jesse J, [place]   81 mg at 07/06/22 0109   bisacodyl (DULCOLAX) EC tablet 5 mg  5 mg Oral Daily PRN [place], Jesse J, PA-C       buPROPion (WELLBUTRIN XL) 24 hr tablet 300 mg  300 mg Oral Daily [place], Jesse J, PA-C   300 mg at 07/07/22 1349   calcitRIOL (ROCALTROL) capsule 0.5 mcg  0.5 mcg Oral Q M,W,F-HD [place], Jesse J, PA-C   0.5 mcg at 07/07/22 1352   camphor-menthol (SARNA) lotion 1 Application  1 Application Topical Q8H PRN [place], Jesse J, PA-C       And   hydrOXYzine (ATARAX) tablet 25 mg  25 mg Oral Q8H PRN [place], Jesse J, PA-C       Chlorhexidine Gluconate Cloth 2 % PADS 6 each  6 each Topical Q0600 [place], Jesse J, PA-C   6 each at 07/06/22 0630   Chlorhexidine Gluconate Cloth 2 % PADS 6 each  6 each Topical Q0600 Penninger, Lillia Abed, [place]   6 each at 07/07/22 1353   cinacalcet (SENSIPAR) tablet 30 mg  30 mg Oral Q M,W,F-1800 [place], Jesse J, PA-C   30 mg at 07/04/22 1711   Darbepoetin [place] (ARANESP) injection 100 mcg  100 mcg Subcutaneous Q Fri-1800  Bufford Buttner, MD   100 mcg at 07/04/22 2220   docusate sodium (ENEMEEZ) enema 283 mg  1 enema Rectal PRN [place], Jesse J, PA-C       donepezil (ARICEPT) tablet 5 mg  5 mg Oral QHS [place], Jesse J, [place]   5 mg at 07/06/22 2131   feeding supplement (NEPRO CARB STEADY) liquid 237 mL  237 mL Oral TID PRN [place], Jesse J, PA-C       ferric gluconate (FERRLECIT) 125 mg in sodium chloride 0.9 % 100 mL IVPB  125 mg Intravenous Q M,W,F-HD Penninger, Lindsay, PA 110 mL/hr at 07/07/22 1100 125 mg at 07/07/22 1100   folic acid (FOLVITE) tablet  1 mg  1 mg Oral Daily Rodolph Bong, MD   1 mg at 07/07/22 1349   heparin injection 5,000 Units  5,000 Units Subcutaneous Q8H Ilda Basset, [place]   5,000 Units at 07/07/22 4540   heparin sodium (porcine) 1000 UNIT/ML injection            loratadine (CLARITIN) tablet 10 mg  10 mg Oral Daily [place], Jesse J, [place]   10 mg at 07/07/22 1349   melatonin tablet 10 mg  10 mg Oral QHS [place], Jesse J, [place]   10 mg at 07/06/22 2131   methocarbamol (ROBAXIN) tablet 500 mg  500 mg Oral Q6H PRN [place], Jesse J, PA-C   500 mg at 07/04/22 2105   Or   methocarbamol (ROBAXIN) 500 mg in dextrose 5 % 50 mL IVPB  500 mg Intravenous Q6H PRN [place], Jesse J, PA-C       multivitamin (RENA-VIT) tablet 1 tablet  1 tablet Oral QHS Rodolph Bong, MD   1 tablet at 07/06/22 2131   nebivolol (BYSTOLIC) tablet 20 mg  20 mg Oral QHS Penninger, Lindsay, PA   20 mg at 07/04/22 2104   ondansetron (ZOFRAN) tablet 4 mg  4 mg Oral Q6H PRN [place], Jesse J, PA-C       Or   ondansetron [facility]) injection 4 mg  4 mg Intravenous Q6H PRN [place], Jesse J, PA-C   4 mg at 07/03/22 1115   oxyCODONE (Oxy IR/ROXICODONE) immediate release tablet 5-10 mg  5-10 mg Oral Q4H PRN [place], Jesse J, PA-C   10 mg at 07/05/22 0432   pantoprazole (PROTONIX) EC tablet 40 mg  40 mg Oral QAC supper [place], Jesse J, PA-C   40 mg at 07/06/22 1726   polyethylene glycol (MIRALAX / GLYCOLAX) packet 17 g  17 g Oral Daily  PRN [place], Jesse J, PA-C       polyethylene glycol (MIRALAX / GLYCOLAX) packet 17 g  17 g Oral BID [place], Jesse J, [place]   17 g at 07/06/22 2132   rosuvastatin (CRESTOR) tablet 5 mg  5 mg Oral Daily [place], Jesse J, PA-C   5 mg at 07/07/22 1349   senna-docusate (Senokot-S) tablet 1 tablet  1 tablet Oral BID Rodolph Bong, MD   1 tablet at 07/07/22 1349   sevelamer carbonate (RENVELA) tablet 800 mg  800 mg Oral TID WC Bufford Buttner, MD   800 mg at 07/07/22 1350   sorbitol 70 % solution 30 mL  30 mL Oral PRN [place], Jesse J, PA-C       tamsulosin [facility]) capsule 0.4 mg  0.4 mg Oral Daily [place], Jesse J, PA-C   0.4 mg at 07/07/22 1348   venlafaxine XR (EFFEXOR-XR) 24 hr capsule 75 mg  75 mg Oral Daily [place], Jesse J, PA-C   75 mg at 07/07/22 1348   zolpidem (AMBIEN) tablet 5 mg  5 mg Oral QHS PRN [place], Jesse J, PA-C   5 mg at 07/06/22 2131     Discharge Medications: Please see discharge summary for a list of discharge medications.  Relevant Imaging Results:  Relevant Lab Results:   Additional Information [phone]  Pt is HD pt currently at [facility] M[facility] 1110am [facility] for 1130 chair time.   Lorri Frederick, LCSW

## 2022-07-07 NOTE — Progress Notes (Addendum)
Received patient in bed.Awake ,alert and oriented x 4.  Medicines given: Ferric gluconate 125 mg =100cc.                             Albumin 25 mg =120 cc.                             Heparin 1500 units pre-run bolus.  Access used :Right ARM avg that worked well.  Duration of treatment : 4 hours.  Fluid removed : 1.6  Hemo comment; Tolerated treatment very well.  Hand off to the patient's nurse.

## 2022-07-07 NOTE — Progress Notes (Signed)
[facility] Progress Note   Subjective:   Seen in dialysis --staff able to cannulate AVF w/o issue. No new complaints this am. Unsure of dispo  Objective Vitals:   07/07/22 0902 07/07/22 0937 07/07/22 1000 07/07/22 1030  BP: 104/63 102/61 95/62 98/66   Pulse: 73 71 70 70  Resp: 14 16 17 17   Temp:      TempSrc:      SpO2: 100% 96% 100% 100%  Weight:      Height:       Physical Exam General: well appearing, elderly male in NAD Heart: RRR, no mrg Lungs: CTAB, nml WOB on RA Abdomen: soft, NTND Extremities: no LE edema, R arm in splint Dialysis Access: RU AVF +b/t   Filed Weights   07/03/22 0932 07/04/22 0924 07/07/22 0820  Weight: 84.4 kg 66.8 kg 67.7 kg    Intake/Output Summary (Last 24 hours) at 07/07/2022 1052 Last data filed at 07/07/2022 0459 Gross per 24 hour  Intake 300 ml  Output 800 ml  Net -500 ml     Additional Objective Labs: Basic Metabolic Panel: Recent Labs  Lab 07/05/22 0244 07/06/22 0047 07/07/22 0736  NA 135 134* 135  K 4.0 3.9 3.6  CL 97* 95* 97*  CO2 27 28 25   GLUCOSE 99 93 124*  BUN 23 35* 49*  CREATININE 3.80* 5.29* 6.74*  CALCIUM 7.9* 8.2* 8.6*  PHOS 3.5 5.0* 4.9*    Liver Function Tests: Recent Labs  Lab 07/02/22 1339 07/04/22 0641 07/05/22 0244 07/06/22 0047 07/07/22 0736  AST 13*  --   --   --   --   ALT 8  --   --   --   --   ALKPHOS 46  --   --   --   --   BILITOT 0.5  --   --   --   --   PROT 6.1*  --   --   --   --   ALBUMIN 2.8*   < > 2.4* 2.2* 2.2*   < > = values in this interval not displayed.    CBC: Recent Labs  Lab 07/02/22 1339 07/02/22 1352 07/03/22 0104 07/04/22 0641 07/04/22 1730 07/05/22 0244 07/06/22 0047 07/07/22 0500  WBC 8.1  --  10.3 10.4  --  7.9 7.6 7.3  NEUTROABS 6.2  --   --   --   --   --   --   --   HGB 9.2*   < > 8.7* 7.0*   < > 8.5* 7.3* 7.0*  HCT 28.5*   < > 26.5* 21.7*   < > 26.2* 22.1* 21.7*  MCV 100.0  --  98.9 96.4  --  94.9 95.7 94.8  PLT 187  --  187 178   --  162 178 212   < > = values in this interval not displayed.    Blood Culture    Component Value Date/Time   SDES BLOOD LEFT HAND 09/09/2018 1122   SPECREQUEST  09/09/2018 1122    BOTTLES DRAWN AEROBIC ONLY Blood Culture results may not be optimal due to an inadequate volume of blood received in culture bottles   CULT  09/09/2018 1122    NO GROWTH 5 DAYS Performed at [facility], [address], [place], [place] [address]    REPTSTATUS 09/14/2018 FINAL 09/09/2018 1122   Iron Studies:  Recent Labs    07/04/22 1730  IRON 28*  TIBC 253  FERRITIN 178  Lab Results  Component Value Date   INR 1.1 02/21/2022   INR 1.1 02/16/2021   INR 1.1 05/11/2020   Studies/Results: No results found.  Medications:  anticoagulant sodium citrate     ferric gluconate (FERRLECIT) IVPB     methocarbamol (ROBAXIN) IV      sodium chloride   Intravenous Once   acetaminophen  650 mg Oral Once   allopurinol  150 mg Oral Daily   aspirin EC  81 mg Oral Daily   buPROPion  300 mg Oral Daily   calcitRIOL  0.5 mcg Oral Q M,W,F-HD   Chlorhexidine Gluconate Cloth  6 each Topical Q0600   Chlorhexidine Gluconate Cloth  6 each Topical Q0600   cinacalcet  30 mg Oral Q M,W,F-1800   darbepoetin (ARANESP) injection - DIALYSIS  100 mcg Subcutaneous Q Fri-1800   donepezil  5 mg Oral QHS   folic acid  1 mg Oral Daily   heparin injection (subcutaneous)  5,000 Units Subcutaneous Q8H   heparin sodium (porcine)       loratadine  10 mg Oral Daily   melatonin  10 mg Oral QHS   multivitamin  1 tablet Oral QHS   nebivolol  20 mg Oral QHS   pantoprazole  40 mg Oral QAC supper   polyethylene glycol  17 g Oral BID   rosuvastatin  5 mg Oral Daily   senna-docusate  1 tablet Oral BID   sevelamer carbonate  800 mg Oral TID WC   tamsulosin  0.4 mg Oral Daily   venlafaxine XR  75 mg Oral Daily    Dialysis Orders: [facility] MWF 4:00  400/A1.5x EDW 86kg 2K/2Ca AVG  Heparin 1500 Mircera 75 q 2 wks -last  6/12 Calcitriol 0.5 TIW, Sensipar 30 TIW   Assessment/Plan: R hip fracture d/t mechanical fall - Ortho consulted. S/p repair 07/03/22 R olecranon Frx - splint in place.   ESRD -  HD MWF. Continue on schedule. HD today -using AVF with splint in place.  Hypertension/volume  - BP remains soft today without antihypertensive in last 24hours.  Holding parameters in place on meds.  Volume ok.  Anemia  - Hgb 7.3. on ESA, next due 6/26. 1 unit pRBC given 6/21. Tsat 11%, start IV iron.  Follow trends Metabolic bone disease - Corrected Ca and phos in goal. Continue binders/calcitriol/Sensipar. Nutrition - Renal diet/Prot supp  Tomasa Blase PA-C [facility] 07/07/2022,10:52 AM

## 2022-07-08 DIAGNOSIS — N186 End stage renal disease: Secondary | ICD-10-CM | POA: Diagnosis not present

## 2022-07-08 DIAGNOSIS — I10 Essential (primary) hypertension: Secondary | ICD-10-CM | POA: Diagnosis not present

## 2022-07-08 DIAGNOSIS — F32A Depression, unspecified: Secondary | ICD-10-CM | POA: Diagnosis not present

## 2022-07-08 DIAGNOSIS — S72001P Fracture of unspecified part of neck of right femur, subsequent encounter for closed fracture with malunion: Secondary | ICD-10-CM | POA: Diagnosis not present

## 2022-07-08 LAB — HEMOGLOBIN AND HEMATOCRIT, BLOOD
HCT: 22.7 % — ABNORMAL LOW (ref 39.0–52.0)
Hemoglobin: 7.5 g/dL — ABNORMAL LOW (ref 13.0–17.0)

## 2022-07-08 LAB — RENAL FUNCTION PANEL
Albumin: 2.6 g/dL — ABNORMAL LOW (ref 3.5–5.0)
Anion gap: 10 (ref 5–15)
BUN: 26 mg/dL — ABNORMAL HIGH (ref 8–23)
CO2: 28 mmol/L (ref 22–32)
Calcium: 8.3 mg/dL — ABNORMAL LOW (ref 8.9–10.3)
Chloride: 97 mmol/L — ABNORMAL LOW (ref 98–111)
Creatinine, Ser: 4.07 mg/dL — ABNORMAL HIGH (ref 0.61–1.24)
GFR, Estimated: 14 mL/min — ABNORMAL LOW (ref >60)
Glucose, Bld: 81 mg/dL (ref 70–99)
Phosphorus: 3 mg/dL (ref 2.5–4.6)
Potassium: 3.7 mmol/L (ref 3.5–5.1)
Sodium: 135 mmol/L (ref 135–145)

## 2022-07-08 MED ORDER — OXYCODONE HCL 5 MG PO TABS: ORAL_TABLET | ORAL | 0 refills | Status: DC

## 2022-07-08 NOTE — Progress Notes (Signed)
[facility] Progress Note   Subjective:   Seen in room. Got up with PT this am, now resting in recliner. Likely for SNF placement.   Objective Vitals:   07/07/22 1326 07/07/22 2037 07/08/22 0459 07/08/22 0744  BP: 90/62 126/77 95/65 109/64  Pulse: 73 70 73 74  Resp: 18 17 17 16   Temp: 98.2 F (36.8 C) 98.4 F (36.9 C) 98.2 F (36.8 C) 98.5 F (36.9 C)  TempSrc: Oral Oral Oral Oral  SpO2: 100% 100% 100% 100%  Weight:      Height:       Physical Exam General: well appearing, elderly male in NAD Heart: RRR, no mrg Lungs: CTAB, nml WOB on RA Abdomen: soft, NTND Extremities: no LE edema, R arm in splint Dialysis Access: RU AVF +b/t   Filed Weights   07/04/22 0924 07/07/22 0820 07/07/22 1300  Weight: 66.8 kg 67.7 kg 66.1 kg    Intake/Output Summary (Last 24 hours) at 07/08/2022 1112 Last data filed at 07/08/2022 0459 Gross per 24 hour  Intake 89.4 ml  Output 2050 ml  Net -1960.6 ml     Additional Objective Labs: Basic Metabolic Panel: Recent Labs  Lab 07/06/22 0047 07/07/22 0736 07/08/22 0109  NA 134* 135 135  K 3.9 3.6 3.7  CL 95* 97* 97*  CO2 28 25 28   GLUCOSE 93 124* 81  BUN 35* 49* 26*  CREATININE 5.29* 6.74* 4.07*  CALCIUM 8.2* 8.6* 8.3*  PHOS 5.0* 4.9* 3.0    Liver Function Tests: Recent Labs  Lab 07/02/22 1339 07/04/22 0641 07/06/22 0047 07/07/22 0736 07/08/22 0109  AST 13*  --   --   --   --   ALT 8  --   --   --   --   ALKPHOS 46  --   --   --   --   BILITOT 0.5  --   --   --   --   PROT 6.1*  --   --   --   --   ALBUMIN 2.8*   < > 2.2* 2.2* 2.6*   < > = values in this interval not displayed.    CBC: Recent Labs  Lab 07/02/22 1339 07/02/22 1352 07/03/22 0104 07/04/22 0641 07/04/22 1730 07/05/22 0244 07/06/22 0047 07/07/22 0500 07/08/22 0109  WBC 8.1  --  10.3 10.4  --  7.9 7.6 7.3  --   NEUTROABS 6.2  --   --   --   --   --   --   --   --   HGB 9.2*   < > 8.7* 7.0*   < > 8.5* 7.3* 7.0* 7.5*  HCT 28.5*   <  > 26.5* 21.7*   < > 26.2* 22.1* 21.7* 22.7*  MCV 100.0  --  98.9 96.4  --  94.9 95.7 94.8  --   PLT 187  --  187 178  --  162 178 212  --    < > = values in this interval not displayed.    Blood Culture    Component Value Date/Time   SDES BLOOD LEFT HAND 09/09/2018 1122   SPECREQUEST  09/09/2018 1122    BOTTLES DRAWN AEROBIC ONLY Blood Culture results may not be optimal due to an inadequate volume of blood received in culture bottles   CULT  09/09/2018 1122    NO GROWTH 5 DAYS Performed at [facility], [address] [place], [place] [address]  REPTSTATUS 09/14/2018 FINAL 09/09/2018 1122   Iron Studies:  No results for input(s): "IRON", "TIBC", "TRANSFERRIN", "FERRITIN" in the last 72 hours.  Lab Results  Component Value Date   INR 1.1 02/21/2022   INR 1.1 02/16/2021   INR 1.1 05/11/2020   Studies/Results: No results found.  Medications:  ferric gluconate (FERRLECIT) IVPB 125 mg (07/07/22 1100)   methocarbamol (ROBAXIN) IV      sodium chloride   Intravenous Once   acetaminophen  650 mg Oral Once   allopurinol  150 mg Oral Daily   aspirin EC  81 mg Oral Daily   buPROPion  300 mg Oral Daily   calcitRIOL  0.5 mcg Oral Q M,W,F-HD   Chlorhexidine Gluconate Cloth  6 each Topical Q0600   Chlorhexidine Gluconate Cloth  6 each Topical Q0600   cinacalcet  30 mg Oral Q M,W,F-1800   darbepoetin (ARANESP) injection - DIALYSIS  100 mcg Subcutaneous Q Fri-1800   donepezil  5 mg Oral QHS   folic acid  1 mg Oral Daily   heparin injection (subcutaneous)  5,000 Units Subcutaneous Q8H   loratadine  10 mg Oral Daily   melatonin  10 mg Oral QHS   multivitamin  1 tablet Oral QHS   nebivolol  20 mg Oral QHS   pantoprazole  40 mg Oral QAC supper   polyethylene glycol  17 g Oral BID   rosuvastatin  5 mg Oral Daily   senna-docusate  1 tablet Oral BID   sevelamer carbonate  800 mg Oral TID WC   tamsulosin  0.4 mg Oral Daily   venlafaxine XR  75 mg Oral Daily    Dialysis  Orders: [facility] MWF 4:00  400/A1.5x EDW 86kg 2K/2Ca AVG  Heparin 1500 Mircera 75 q 2 wks -last 6/12 Calcitriol 0.5 TIW, Sensipar 30 TIW   Assessment/Plan: R hip fracture d/t mechanical fall - Ortho consulted. S/p repair 07/03/22 R olecranon Frx - splint in place.   ESRD -  HD MWF. Continue on schedule. Next HD 6/26  Hypertension/volume  - BP remains soft today without antihypertensive in last 24hours.  Holding parameters in place on meds.  Volume ok.  Anemia  - Hgb 7.5. on ESA, next due 6/26. 1 unit pRBC given 6/21. Tsat 11%, getting IV iron. Follow trends Metabolic bone disease - Corrected Ca and phos in goal. Continue binders/calcitriol/Sensipar. Nutrition - Renal diet/Prot supp Dispo - For SNF placement   Jacob [name] Susann Givens PA-C [facility] 07/08/2022,11:12 AM

## 2022-07-08 NOTE — Plan of Care (Signed)
  Problem: Education: Goal: Knowledge of General Education information will improve Description: Including pain rating scale, medication(s)/side effects and non-pharmacologic comfort measures Outcome: Progressing   Problem: Health Behavior/Discharge Planning: Goal: Ability to manage health-related needs will improve Outcome: Progressing   Problem: Activity: Goal: Risk for activity intolerance will decrease Outcome: Progressing   

## 2022-07-08 NOTE — TOC Progression Note (Addendum)
Transition of Care [facility]) - Progression Note    Patient Details  Name: Jacob [name] MRN: [id number] Date of Birth: [birth year]  Transition of Care [facility]) CM/SW Contact  Lorri Frederick, LCSW Phone Number: 07/08/2022, 1:16 PM  Clinical Narrative:  Bed offers provided to pt, he asks CSW to contact family.  Unable to reach son Ladene Artist, spoke with wife who asked CSW to call Derrick's wife Eunice Blase, [phone].  CSW and pt spoke with Debbie on speakerphone and they want to accept offer at [facility].  CSW waiting on confirmation from [facility].    1330: [facility] confirms.  Expected Discharge Plan: Skilled Nursing Facility Barriers to Discharge: SNF Pending bed offer  Expected Discharge Plan and Services In-house Referral: Clinical Social Work   Post Acute Care Choice: Skilled Nursing Facility Living arrangements for the past 2 months: Single Family Home                                       Social Determinants of Health (SDOH) Interventions SDOH Screenings   Food Insecurity: No Food Insecurity (07/02/2022)  Housing: Low Risk  (07/02/2022)  Transportation Needs: No Transportation Needs (07/02/2022)  Utilities: Not At Risk (07/02/2022)  Tobacco Use: Medium Risk (07/03/2022)    Readmission Risk Interventions    12/22/2019    9:46 AM  Readmission Risk Prevention Plan  Transportation Screening Complete  PCP or Specialist Appt within 3-5 Days Not Complete  Not Complete comments first available appt on 12/23  HRI or Home Care Consult Complete  Social Work Consult for Recovery Care Planning/Counseling Complete  Palliative Care Screening Not Applicable

## 2022-07-08 NOTE — Progress Notes (Signed)
Physical Therapy Treatment Patient Details Name: Jacob [name] MRN: [id number] DOB: November 25, 1945 Today's Date: 07/08/2022   History of Present Illness 77 y.o. male presenting with fall 6/19, found to have R hip and Rt elbow fx. He is now s/p R hip repair 07/03/22. Non-op R olecranon fx splinted. NWB RUE with WBAT RLE. PMH of ESRD on HD, HTN, HLD, gout, hx prostate cancer s/p radiation.    PT Comments    Pt with fair tolerance to treatment today. Pt was able to transfer to chair with +2 Mod A using hemiwalker however did requires multiple verbal cues for maintain NWB precautions with RUE. No change in DC/DME recs at this time. PT will continue to follow.   Recommendations for follow up therapy are one component of a multi-disciplinary discharge planning process, led by the attending physician.  Recommendations may be updated based on patient status, additional functional criteria and insurance authorization.  Follow Up Recommendations  Can patient physically be transported by private vehicle: No    Assistance Recommended at Discharge Frequent or constant Supervision/Assistance  Patient can return home with the following Two people to help with walking and/or transfers;A lot of help with bathing/dressing/bathroom;Assistance with cooking/housework;Assist for transportation;Help with stairs or ramp for entrance   Equipment Recommendations  Other (comment) (Per accepting facility)    Recommendations for Other Services       Precautions / Restrictions Precautions Precautions: Fall Required Braces or Orthoses: Splint/Cast;Sling Splint/Cast: right long arm splint Restrictions Weight Bearing Restrictions: Yes RUE Weight Bearing: Non weight bearing RLE Weight Bearing: Weight bearing as tolerated     Mobility  Bed Mobility Overal bed mobility: Needs Assistance Bed Mobility: Supine to Sit     Supine to sit: Supervision, HOB elevated     General bed mobility comments: Cues for  using LLE to assist with RLE. Pt able to perform on his own however required increased time. Multiple cues for maintaining WB precations.    Transfers Overall transfer level: Needs assistance Equipment used: Hemi-walker Transfers: Sit to/from Stand, Bed to chair/wheelchair/BSC Sit to Stand: +2 physical assistance, Mod assist, From elevated surface   Step pivot transfers: +2 physical assistance, Mod assist       General transfer comment: Pt initially with posterior lean upon standing however able to correct with verbal and tactile cues. Pt with very small hops to chair. Pt with difficulty sequencing steps.    Ambulation/Gait               General Gait Details: Deferred for safety   Stairs             Wheelchair Mobility    Modified Rankin (Stroke Patients Only)       Balance Overall balance assessment: Needs assistance Sitting-balance support: Feet supported Sitting balance-Leahy Scale: Fair Sitting balance - Comments: No LOB with static sitting   Standing balance support: During functional activity, No upper extremity supported Standing balance-Leahy Scale: Poor Standing balance comment: Reliant on hemiwalker                            Cognition Arousal/Alertness: Awake/alert Behavior During Therapy: WFL for tasks assessed/performed Overall Cognitive Status: No family/caregiver present to determine baseline cognitive functioning                                 General Comments: Pt required multiple cues for his WB precautions  Exercises      General Comments General comments (skin integrity, edema, etc.): VSS on RA      Pertinent Vitals/Pain Pain Assessment Pain Assessment: No/denies pain    Home Living                          Prior Function            PT Goals (current goals can now be found in the care plan section) Progress towards PT goals: Progressing toward goals    Frequency    Min  3X/week      PT Plan Current plan remains appropriate    Co-evaluation              AM-PAC PT "6 Clicks" Mobility   Outcome Measure  Help needed turning from your back to your side while in a flat bed without using bedrails?: A Little Help needed moving from lying on your back to sitting on the side of a flat bed without using bedrails?: A Little Help needed moving to and from a bed to a chair (including a wheelchair)?: A Lot Help needed standing up from a chair using your arms (e.g., wheelchair or bedside chair)?: A Lot Help needed to walk in hospital room?: Total Help needed climbing 3-5 steps with a railing? : Total 6 Click Score: 12    End of Session Equipment Utilized During Treatment: Gait belt Activity Tolerance: Patient tolerated treatment well Patient left: in chair;with call bell/phone within reach;with chair alarm set Nurse Communication: Mobility status;Need for lift equipment;Weight bearing status PT Visit Diagnosis: Unsteadiness on feet (R26.81);Muscle weakness (generalized) (M62.81);History of falling (Z91.81);Difficulty in walking, not elsewhere classified (R26.2);Pain Pain - Right/Left: Right Pain - part of body: Hip;Arm     Time: 1040-1058 PT Time Calculation (min) (ACUTE ONLY): 18 min  Charges:  $Therapeutic Activity: 8-22 mins                     Shela Nevin, PT, DPT Acute Rehab Services [phone]    [name] 07/08/2022, 3:55 PM

## 2022-07-08 NOTE — Progress Notes (Signed)
Ortho Tech contacted for long arm splint placement

## 2022-07-08 NOTE — Progress Notes (Signed)
Advised by CSW that pt has made snf decision. Contacted inpt HD to request that pt receive inpt HD tomorrow 1st shift if possible in the event pt can d/c to snf tomorrow after HD. Will assist as needed.   Olivia Canter Renal Navigator [phone]

## 2022-07-08 NOTE — Progress Notes (Signed)
     Jacob [name] is a 77 y.o. male   Orthopaedic diagnosis:Status post IM nail fixation of right intertrochanteric hip fracture 07/03/2022  Right minimally displaced transverse olecranon fracture  Subjective: Patient resting in chair ordering lunch.  He did take off his right posterior splint by himself after dialysis yesterday.  Reports it was not applied correctly after dialysis and was causing discomfort.  Otherwise, reports pain is well-controlled at this point.  Planning for SNF placement.  Objectyive: Vitals:   07/08/22 0744 07/08/22 1355  BP: 109/64 (!) 100/52  Pulse: 74 76  Resp: 16 17  Temp: 98.5 F (36.9 C) 98.4 F (36.9 C)  SpO2: 100% 100%      Examination of the right hip shows intact clean, Mepilex dressings in place.  The dressing was not removed. He tolerates gentle flexion of the knee. His calf is soft and nontender. He has fairly strength at the ankle. Endorses intact sensation to light touch distally. Warm well-perfused distally    Examination of the right upper extremity shows arm in sling.  Splint has been removed.  Tenderness to palpation about the elbow.  Swelling noted.  Exposed skin is benign.  Good range of motion, sensation, motor function distally in the hand and wrist.  Warm and well-perfused distally with intact sensation.   Assessment: Postop day  status post the above, doing well Right minimally displaced transverse olecranon fracture, being treated nonoperatively   Plan: Patient stable for discharge from an orthopedic standpoint once cleared by physical therapy and primary team.  I have ordered a right long-arm posterior splint again and encouraged the patient to keep it on while for healing of his fracture in his elbow.  Please try to fabricate splint to allow for adequate dialysis access AV fistula in upper arm.   -WBAT right lower extremity.  Nonweightbearing right upper extremity in splint -Heparin and SCDs for DVT prophylaxis while  inpatient.  Plan to discharge on 325 aspirin daily 1 month for DVT prophylaxis outpatient if other teams are agreeable. -Pain control as needed.  Prescription of oxycodone printed and signed in patients chart for SNF placement.   Plan for outpatient follow-up 2 weeks from surgery with Dr. Susa Simmonds for repeat radiographs of the right elbow and hip, and staple removal if appropriate.   Jacob Vincent J. [place], PA-C

## 2022-07-08 NOTE — Progress Notes (Signed)
PROGRESS NOTE    Jacob [name]  VZD:[id number] DOB: 12/07/1945 DOA: 07/02/2022 PCP: Ellyn Hack, MD    Chief Complaint  Patient presents with   Fall    Brief Narrative:  Patient pleasant 77 year old gentleman medical history significant of ESRD on HD Monday Wednesday Friday, hypertension, hyperlipidemia, prostate cancer status post radiation treatment, OSA on bedtime as needed O2 presented with a fall while walking up to walk away with his walker to hemodialysis.  Patient seen in the ED imaging done concerning for right hip fracture.  Patient seen by nephrology and underwent hemodialysis the evening of 07/02/2022.  Patient admitted, seen by orthopedics, underwent right hip fracture repair 07/03/2022.  Patient also noted to have an olecranon fracture which has been treated conservatively, nonsurgically. Patient medically stable awaiting SNF placement.   Assessment & Plan:   Principal Problem:   Hip fracture (HCC) Active Problems:   Essential hypertension   Nocturnal hypoxemia   ESRD (end stage renal disease) on dialysis (HCC)   Mixed hyperlipidemia   Depression   Acute postoperative anemia due to expected blood loss   Closed fracture of right olecranon process   Benign prostatic hyperplasia   OSA (obstructive sleep apnea)   ESRD (end stage renal disease) (HCC)  #1 right intertrochanteric hip fracture/minimally displaced transverse fracture of the base of the olecranon on the right -Secondary to mechanical fall. -Patient seen in consultation by orthopedics and patient subsequently underwent operative fixation per orthopedics 07/03/2022. -Patient also noted with complaints of right elbow pain, plain films of the right elbow obtained showed a displaced transverse fracture of the base of the olecranon on the right.  Patient assessed by orthopedics and nonoperative management at this time. -WBAT right lower extremity, nonweightbearing right upper extremity in splint per  orthopedics. -PT/OT. -Pain management, DVT prophylaxis per orthopedics. -Outpatient follow-up with orthopedics.   2.  ESRD on HD MWF -Patient seen in consultation by nephrology on admission and underwent hemodialysis on 07/02/2022 in preparation for operative fixation of right hip fracture 07/03/2022. -Postoperatively patient receiving hemodialysis. -Patient tolerated hemodialysis, 07/07/2022 in house and able to use AVF with right upper extremity splint in place.  -Per nephrology.   3.  Hypertension -Blood pressure soft.   -On Bystolic with holding parameters.   -Verapamil discontinued.    4.  BPH/remote prostate cancer -Tamsulosin.    5.  Hyperlipidemia -Continue statin.   6.  OSA -Not on CPAP.   -Noted to wear nocturnal O2 about 2 nights per week.   7.  Mood disorder, dementia -Stable.   -Continue venlafaxine, donezepil, bupropion, melatonin.   -Delirium precautions.    8.  Postop acute blood loss anemia/folate deficiency -Hemoglobin currently at 7.5 from 7.0 from 7.3 from 8.5 from 7.0 from 9.5 on admission.. -Anemia panel with iron level of 28, TIBC of 253, folate of 4.8, ferritin of 178.   -Status post transfusion 1 unit packed red blood cells.  -Continue folic acid 1 mg daily.    -Follow H&H.   -Transfusion threshold hemoglobin  < 7.   DVT prophylaxis: Heparin/SCDs inpatient, on discharge aspirin 325 mg daily x 1 month per orthopedics. Code Status: Full Family Communication: Updated patient.  No family at bedside. Disposition: Patient medically stable as of 07/08/2022.  Awaiting SNF placement.    Status is: Inpatient Remains inpatient appropriate because: Severity of illness   Consultants:  Orthopedics: Earney Hamburg, PA 07/02/2022 Orthopedics: Dr. Susa Simmonds 07/03/2022 Nephrology: Dr. Signe Colt 07/02/2022  Procedures:  CT head 07/02/2022 CT  C-spine 07/02/2022 Plain films of the right knee 07/02/2022 Chest x-ray 07/02/2022 Plain films of the right femur 07/03/2022 Plain  films of the right elbow 07/03/2022 Plain films of the right hip and pelvis 07/02/2022 Transfuse 1 unit PRBC 07/04/2022 Plain films of the right elbow 07/03/2022 Plain films of the left foot 07/04/2022  Antimicrobials:  Anti-infectives (From admission, onward)    Start     Dose/Rate Route Frequency Ordered Stop   07/03/22 1700  ceFAZolin (ANCEF) IVPB 2g/100 mL premix        2 g 200 mL/hr over 30 Minutes Intravenous Every 12 hours 07/03/22 1331 07/04/22 1805   07/03/22 0945  ceFAZolin (ANCEF) IVPB 2g/100 mL premix        2 g 200 mL/hr over 30 Minutes Intravenous On call to O.R. 07/03/22 0931 07/03/22 1050         Subjective: Sitting up in recliner.  States right elbow and right hip pain have improved.  No chest pain.  No shortness of breath.  Objective: Vitals:   07/07/22 1326 07/07/22 2037 07/08/22 0459 07/08/22 0744  BP: 90/62 126/77 95/65 109/64  Pulse: 73 70 73 74  Resp: 18 17 17 16   Temp: 98.2 F (36.8 C) 98.4 F (36.9 C) 98.2 F (36.8 C) 98.5 F (36.9 C)  TempSrc: Oral Oral Oral Oral  SpO2: 100% 100% 100% 100%  Weight:      Height:        Intake/Output Summary (Last 24 hours) at 07/08/2022 1236 Last data filed at 07/08/2022 0459 Gross per 24 hour  Intake 89.4 ml  Output 450 ml  Net -360.6 ml    Filed Weights   07/04/22 0924 07/07/22 0820 07/07/22 1300  Weight: 66.8 kg 67.7 kg 66.1 kg    Examination:  General exam: NAD. Respiratory system: CTAB.  No wheezes, no crackles, no rhonchi.  Fair air movement.  Speaking in full sentences. Cardiovascular system: Regular rate rhythm no murmurs rubs or gallops.  No JVD.  No lower extremity edema.  Gastrointestinal system: Abdomen is soft, nontender, nondistended, positive bowel sounds.  No rebound.  No guarding.  Central nervous system: Alert and oriented. No focal neurological deficits. Extremities: Right hip with postop dressing in place.  Right upper extremity in sling and cast.   Skin: No rashes, lesions or  ulcers Psychiatry: Judgement and insight appear normal. Mood & affect appropriate.     Data Reviewed: I have personally reviewed following labs and imaging studies  CBC: Recent Labs  Lab 07/02/22 1339 07/02/22 1352 07/03/22 0104 07/04/22 0641 07/04/22 1730 07/05/22 0244 07/06/22 0047 07/07/22 0500 07/08/22 0109  WBC 8.1  --  10.3 10.4  --  7.9 7.6 7.3  --   NEUTROABS 6.2  --   --   --   --   --   --   --   --   HGB 9.2*   < > 8.7* 7.0* 8.6* 8.5* 7.3* 7.0* 7.5*  HCT 28.5*   < > 26.5* 21.7* 26.2* 26.2* 22.1* 21.7* 22.7*  MCV 100.0  --  98.9 96.4  --  94.9 95.7 94.8  --   PLT 187  --  187 178  --  162 178 212  --    < > = values in this interval not displayed.     Basic Metabolic Panel: Recent Labs  Lab 07/04/22 0641 07/05/22 0244 07/06/22 0047 07/07/22 0736 07/08/22 0109  NA 133* 135 134* 135 135  K 4.5 4.0 3.9 3.6 3.7  CL 91* 97* 95* 97* 97*  CO2 27 27 28 25 28   GLUCOSE 139* 99 93 124* 81  BUN 39* 23 35* 49* 26*  CREATININE 5.94* 3.80* 5.29* 6.74* 4.07*  CALCIUM 8.4* 7.9* 8.2* 8.6* 8.3*  PHOS 6.6* 3.5 5.0* 4.9* 3.0     GFR: Estimated Creatinine Clearance: 14.4 mL/min (A) (by C-G formula based on SCr of 4.07 mg/dL (H)).  Liver Function Tests: Recent Labs  Lab 07/02/22 1339 07/04/22 0641 07/05/22 0244 07/06/22 0047 07/07/22 0736 07/08/22 0109  AST 13*  --   --   --   --   --   ALT 8  --   --   --   --   --   ALKPHOS 46  --   --   --   --   --   BILITOT 0.5  --   --   --   --   --   PROT 6.1*  --   --   --   --   --   ALBUMIN 2.8* 2.4* 2.4* 2.2* 2.2* 2.6*     CBG: No results for input(s): "GLUCAP" in the last 168 hours.   Recent Results (from the past 240 hour(s))  Surgical pcr screen     Status: None   Collection Time: 07/02/22  5:36 PM   Specimen: Nasal Mucosa; Nasal Swab  Result Value Ref Range Status   MRSA, PCR NEGATIVE NEGATIVE Final   Staphylococcus aureus NEGATIVE NEGATIVE Final    Comment: (NOTE) The Xpert SA Assay (FDA approved  for NASAL specimens in patients 69 years of age and older), is one component of a comprehensive surveillance program. It is not intended to diagnose infection nor to guide or monitor treatment. Performed at [facility], [address] [place], [place] [address]          Radiology Studies: No results found.      Scheduled Meds:  sodium chloride   Intravenous Once   acetaminophen  650 mg Oral Once   allopurinol  150 mg Oral Daily   aspirin EC  81 mg Oral Daily   buPROPion  300 mg Oral Daily   calcitRIOL  0.5 mcg Oral Q M,W,F-HD   Chlorhexidine Gluconate Cloth  6 each Topical Q0600   Chlorhexidine Gluconate Cloth  6 each Topical Q0600   cinacalcet  30 mg Oral Q M,W,F-1800   darbepoetin (ARANESP) injection - DIALYSIS  100 mcg Subcutaneous Q Fri-1800   donepezil  5 mg Oral QHS   folic acid  1 mg Oral Daily   heparin injection (subcutaneous)  5,000 Units Subcutaneous Q8H   loratadine  10 mg Oral Daily   melatonin  10 mg Oral QHS   multivitamin  1 tablet Oral QHS   nebivolol  20 mg Oral QHS   pantoprazole  40 mg Oral QAC supper   polyethylene glycol  17 g Oral BID   rosuvastatin  5 mg Oral Daily   senna-docusate  1 tablet Oral BID   sevelamer carbonate  800 mg Oral TID WC   tamsulosin  0.4 mg Oral Daily   venlafaxine XR  75 mg Oral Daily   Continuous Infusions:  ferric gluconate (FERRLECIT) IVPB 125 mg (07/07/22 1100)   methocarbamol (ROBAXIN) IV       LOS: 6 days    Time spent: 35 minutes    Ramiro Harvest, MD [facility]   To contact the attending provider between 7A-7P or the covering provider  during after hours 7P-7A, please log into the web site [email or web address] and access using universal [place] password for that web site. If you do not have the password, please call the hospital operator.  07/08/2022, 12:36 PM

## 2022-07-08 NOTE — Care Management Important Message (Signed)
Important Message  Patient Details  Name: Jacob [name] MRN: [id number] Date of Birth: 26-Dec-1945   Medicare Important Message Given:  Yes     Sherilyn Banker 07/08/2022, 2:06 PM

## 2022-07-09 DIAGNOSIS — S52021A Displaced fracture of olecranon process without intraarticular extension of right ulna, initial encounter for closed fracture: Secondary | ICD-10-CM

## 2022-07-09 DIAGNOSIS — Z8781 Personal history of (healed) traumatic fracture: Secondary | ICD-10-CM

## 2022-07-09 LAB — BASIC METABOLIC PANEL
Anion gap: 10 (ref 5–15)
BUN: 37 mg/dL — ABNORMAL HIGH (ref 8–23)
CO2: 26 mmol/L (ref 22–32)
Calcium: 8.5 mg/dL — ABNORMAL LOW (ref 8.9–10.3)
Chloride: 97 mmol/L — ABNORMAL LOW (ref 98–111)
Creatinine, Ser: 5.67 mg/dL — ABNORMAL HIGH (ref 0.61–1.24)
GFR, Estimated: 10 mL/min — ABNORMAL LOW (ref >60)
Glucose, Bld: 103 mg/dL — ABNORMAL HIGH (ref 70–99)
Potassium: 3.7 mmol/L (ref 3.5–5.1)
Sodium: 133 mmol/L — ABNORMAL LOW (ref 135–145)

## 2022-07-09 LAB — HEMOGLOBIN AND HEMATOCRIT, BLOOD
HCT: 23.1 % — ABNORMAL LOW (ref 39.0–52.0)
Hemoglobin: 7.4 g/dL — ABNORMAL LOW (ref 13.0–17.0)

## 2022-07-09 LAB — GLUCOSE, CAPILLARY: Glucose-Capillary: 65 mg/dL — ABNORMAL LOW (ref 70–99)

## 2022-07-09 MED ORDER — SODIUM CHLORIDE 0.9% FLUSH
10.0000 mL | Freq: Two times a day (BID) | INTRAVENOUS | Status: DC
  Administered 2022-07-09: 10 mL

## 2022-07-09 MED ORDER — HEPARIN SODIUM (PORCINE) 1000 UNIT/ML IJ SOLN
INTRAMUSCULAR | Status: AC
  Administered 2022-07-09: 1500 [IU] via INTRAVENOUS
  Filled 2022-07-09: qty 2

## 2022-07-09 MED ORDER — HEPARIN SODIUM (PORCINE) 1000 UNIT/ML IJ SOLN: 1500.0000 [IU] | Freq: Once | INTRAMUSCULAR | Status: AC

## 2022-07-09 MED ORDER — HEPARIN SOD (PORK) LOCK FLUSH 100 UNIT/ML IV SOLN
500.0000 [IU] | INTRAVENOUS | Status: AC | PRN
  Administered 2022-07-09: 500 [IU]

## 2022-07-09 MED ORDER — SODIUM CHLORIDE 0.9% FLUSH: 10.0000 mL | INTRAVENOUS | Status: DC | PRN

## 2022-07-09 MED ORDER — HEPARIN SODIUM (PORCINE) 1000 UNIT/ML IJ SOLN
INTRAMUSCULAR | Status: AC
  Filled 2022-07-09: qty 2

## 2022-07-09 MED ORDER — DARBEPOETIN ALFA 150 MCG/0.3ML IJ SOSY: 150.0000 ug | PREFILLED_SYRINGE | INTRAMUSCULAR | Status: DC

## 2022-07-09 MED ORDER — ASPIRIN EC 81 MG PO TBEC: 325.0000 mg | DELAYED_RELEASE_TABLET | Freq: Every day | ORAL | 0 refills | Status: DC

## 2022-07-09 NOTE — Progress Notes (Signed)
Occupational Therapy Treatment Patient Details Name: Jacob [name] MRN: [id number] DOB: Jun 06, 1945 Today's Date: 07/09/2022   History of present illness 77 y.o. male presenting with fall 6/19, found to have R hip and Rt elbow fx. He is now s/p R hip IMN 07/03/22. Non-op R olecranon fx splinted. NWB RUE with WBAT RLE. PMH of ESRD on HD, HTN, HLD, gout, hx prostate cancer s/p radiation.   OT comments  Pt continuing to progress towards pt focused goals, Pt needing consistent cueing to maintain NWB RUE during functional mobility cues to place hand in lap. Pt also needing OT assist for anterior weight shift when transitioning to stand as he likes to lean posteriorly once standing he can take steps to nearby [facility] with Min A and cues to control descent. Educated pt on the benefits of mobility and encourage pt to continue to put in effort upon his DC to SNF, also educated pt on rolling in bed for pressure relief with strategy to use momentum to roll left due to impaired RUE. Pt verbalized understanding of OT education and advise. OT to continue to progress pt as able, DC plans remain appropriate for SNF.    Recommendations for follow up therapy are one component of a multi-disciplinary discharge planning process, led by the attending physician.  Recommendations may be updated based on patient status, additional functional criteria and insurance authorization.    Assistance Recommended at Discharge Frequent or constant Supervision/Assistance  Patient can return home with the following  Two people to help with bathing/dressing/bathroom;Assist for transportation;Help with stairs or ramp for entrance;Assistance with cooking/housework;A lot of help with walking and/or transfers   Equipment Recommendations  None recommended by OT (TBD at next level of care)    Recommendations for Other Services      Precautions / Restrictions Precautions Precautions: Fall Required Braces or Orthoses:  Splint/Cast;Sling Splint/Cast: right long arm splint Restrictions Weight Bearing Restrictions: Yes RUE Weight Bearing: Non weight bearing RLE Weight Bearing: Weight bearing as tolerated       Mobility Bed Mobility Overal bed mobility: Needs Assistance Bed Mobility: Supine to Sit, Sit to Supine     Supine to sit: Min assist, HOB elevated Sit to supine: Min assist   General bed mobility comments: Pt needing min A for RLE control during bed mobility, cueing to use LUE and rail to assist    Transfers Overall transfer level: Needs assistance Equipment used: Hemi-walker Transfers: Sit to/from Stand, Bed to chair/wheelchair/BSC Sit to Stand: +2 physical assistance, Mod assist, From elevated surface     Step pivot transfers: Min assist, +2 physical assistance     General transfer comment: intitial STS Mod A +2 from EOB elevated surface, STS from [facility] Mod A with cues to scoot forward and reposition heels with OT assist for anterior trunk weight shift. Pt needing consistent cueing to not use RUE during scooting and STS     Balance Overall balance assessment: Needs assistance Sitting-balance support: Feet supported Sitting balance-Leahy Scale: Fair     Standing balance support: Single extremity supported, During functional activity, Reliant on assistive device for balance Standing balance-Leahy Scale: Poor Standing balance comment: Hemi Walker reliant                           ADL either performed or assessed with clinical judgement   ADL Overall ADL's : Needs assistance/impaired  Lower Body Dressing: Maximal assistance Lower Body Dressing Details (indicate cue type and reason): don/doff shoes Toilet Transfer: Minimal assistance;+2 for physical assistance;+2 for safety/equipment;Rolling walker (2 wheels);BSC/3in1   Toileting- Clothing Manipulation and Hygiene: Maximal assistance Toileting - Clothing Manipulation Details (indicate cue  type and reason): Max A for rear pericare     Functional mobility during ADLs: Minimal assistance;Rolling walker (2 wheels);+2 for safety/equipment;+2 for physical assistance General ADL Comments: Hemi walker used for functional mobility to place RUE at resting position    Extremity/Trunk Assessment              Vision       Perception     Praxis      Cognition Arousal/Alertness: Awake/alert Behavior During Therapy: WFL for tasks assessed/performed Overall Cognitive Status: Impaired/Different from baseline Area of Impairment: Safety/judgement                         Safety/Judgement: Decreased awareness of safety, Decreased awareness of deficits     General Comments: Decreased recall of WB precautions for RUE        Exercises      Shoulder Instructions       General Comments VSS    Pertinent Vitals/ Pain       Pain Assessment Pain Assessment: Faces Faces Pain Scale: Hurts even more Pain Location: right hip with WB Pain Descriptors / Indicators: Discomfort, Aching Pain Intervention(s): Limited activity within patient's tolerance, Monitored during session, Repositioned  Home Living                                          Prior Functioning/Environment              Frequency  Min 2X/week        Progress Toward Goals  OT Goals(current goals can now be found in the care plan section)  Progress towards OT goals: Progressing toward goals  Acute Rehab OT Goals Patient Stated Goal: Pt wants to get where he can go back home OT Goal Formulation: With patient Time For Goal Achievement: 07/19/22 Potential to Achieve Goals: Good  Plan Discharge plan remains appropriate;Frequency remains appropriate    Co-evaluation                 AM-PAC OT "6 Clicks" Daily Activity     Outcome Measure   Help from another person eating meals?: A Little Help from another person taking care of personal grooming?: A Little Help  from another person toileting, which includes using toliet, bedpan, or urinal?: A Lot Help from another person bathing (including washing, rinsing, drying)?: Total Help from another person to put on and taking off regular upper body clothing?: A Lot Help from another person to put on and taking off regular lower body clothing?: Total 6 Click Score: 12    End of Session Equipment Utilized During Treatment: Gait belt;Other (comment) (hemi walker)  OT Visit Diagnosis: Unsteadiness on feet (R26.81);Muscle weakness (generalized) (M62.81);Pain Pain - Right/Left: Right Pain - part of body: Hip   Activity Tolerance Patient tolerated treatment well   Patient Left in bed;with call bell/phone within reach;with family/visitor present   Nurse Communication Mobility status        Time: 8413-2440 OT Time Calculation (min): 38 min  Charges: OT General Charges $OT Visit: 1 Visit OT Treatments $Self Care/Home Management : 8-22 mins $  Therapeutic Activity: 8-22 mins  07/09/2022  AB, OTR/L  Acute Rehabilitation Services  Office: [phone]   Tristan Schroeder 07/09/2022, 6:59 PM

## 2022-07-09 NOTE — Discharge Summary (Signed)
Physician Discharge Summary   Patient: Jacob [name] MRN: [id number] DOB: 1945/11/12  Admit date:     07/02/2022  Discharge date: 07/09/22  Discharge Physician: Rolm Gala   PCP: Ellyn Hack, MD   Recommendations at discharge:   WBAT right lower extremity Nonweightbearing right upper extremity in splint Ortho follow-up in 2 weeks with Dr.Adair for repeat x-rays of right elbow and hip and staple removal if appropriate Aspirin 325 mg for 90 days for DVT prophylaxis and resume aspirin 81 mgn thereafter Discontinued verapamil due to soft blood pressures, consider restarting as able If blood pressures continue to be soft, consider discontinuing tamsulosin Repeat CBC in 1 week to check hemoglobin.  Hemoglobin dropped to 7 postoperatively requiring 1 unit transfusion.  Hemoglobin on day of discharge 7.4 and patient asymptomatic Oxycodone for pain management, prescription provided  Please try to fabricate splint to allow for adequate dialysis access AV fistula in upper arm  Continue hemodialysis as an outpatient  Discharge Diagnoses: Principal Problem:   Hip fracture (HCC) Active Problems:   Essential hypertension   Nocturnal hypoxemia   ESRD (end stage renal disease) on dialysis (HCC)   Mixed hyperlipidemia   Depression   Acute postoperative anemia due to expected blood loss   Closed fracture of right olecranon process   Benign prostatic hyperplasia   OSA (obstructive sleep apnea)   ESRD (end stage renal disease) (HCC)  Resolved Problems:   * No resolved hospital problems. [place] [facility] Course: Patient pleasant 77 year old gentleman medical history significant of ESRD on HD Monday Wednesday Friday, hypertension, hyperlipidemia, prostate cancer status post radiation treatment, OSA on bedtime as needed O2 presented with a fall while walking up to walk away with his walker to hemodialysis.  Patient seen in the ED imaging done concerning for right hip fracture.  Patient  seen by nephrology and underwent hemodialysis the evening of 07/02/2022.  Patient admitted, seen by orthopedics, underwent right hip fracture repair 07/03/2022.  Patient also noted to have an olecranon fracture which has been treated conservatively, nonsurgically. Patient medically stable awaiting SNF placement.    Assessment and Plan:   Right intertrochanteric hip fracture/minimally displaced transverse fracture of the base of the olecranon on the right Patient presented with a fall.  Found to have a right intertrochanteric hip fracture and minimally displaced transverse fracture of the olecranon. Seen by orthopedics and underwent operative fixation per orthopedics 07/03/2022. For the elbow orthopedics and nonoperative management at this time. Ortho recommended WBAT right lower extremity, nonweightbearing right upper extremity in splint per orthopedics.  Further recommendations above.  Postop acute blood loss anemia/folate deficiency Hemoglobin on admission was 9.5.  Dropped postoperatively to 7.  S/p 1 unit packed red blood cells.Anemia panel with iron level of 28, TIBC of 253, folate of 4.8, ferritin of 178.  Transfusion threshold hemoglobin  < 7.  Follow-up as an outpatient  ESRD on HD MWF Patient tolerated hemodialysis well pre and postoperatively in hospital.  Blood pressures were soft however patient was asymptomatic.  Hypertension Blood pressure soft so verapamil was discontinued  BPH/remote prostate cancer Continued tamsulosin.    Hyperlipidemia Continued statin   Mood disorder  Dementia Continued venlafaxine, donezepil, bupropion, melatonin.           Consultants: orthopedics  Procedures performed: IM nail fixation of right intertrochanteric hip fracture  Disposition: Skilled nursing facility Diet recommendation:  Regular diet DISCHARGE MEDICATION: Allergies as of 07/09/2022       Reactions   Lisinopril Cough  Medication List     STOP taking these  medications    verapamil 80 MG tablet Commonly known as: CALAN       TAKE these medications    acetaminophen 500 MG tablet Commonly known as: TYLENOL Take 1,000-1,500 mg by mouth daily as needed for moderate pain or headache.   allopurinol 300 MG tablet Commonly known as: ZYLOPRIM Take 150 mg by mouth daily.   aspirin EC 81 MG tablet Take 4 tablets (325 mg total) by mouth daily. Swallow whole. What changed: how much to take   buPROPion 300 MG 24 hr tablet Commonly known as: WELLBUTRIN XL Take 300 mg by mouth daily.   cetirizine 10 MG tablet Commonly known as: ZYRTEC Take 10 mg by mouth daily.   diclofenac Sodium 1 % Gel Commonly known as: VOLTAREN Apply 2 g topically 4 (four) times daily.   donepezil 5 MG tablet Commonly known as: Aricept Take 1 tablet (5 mg total) by mouth at bedtime.   ferrous sulfate 325 (65 FE) MG tablet Take 325 mg by mouth daily with breakfast.   furosemide 40 MG tablet Commonly known as: LASIX Take 40 mg by mouth daily.   lidocaine 5 % ointment Commonly known as: XYLOCAINE Apply 1 application topically as needed.   Melatonin 10 MG Tabs Take 10 mg by mouth at bedtime.   Nebivolol HCl 20 MG Tabs Take 20 mg by mouth at bedtime.   oxyCODONE 5 MG immediate release tablet Commonly known as: Roxicodone Take 1 tablet by mouth every 4 to 6 hours as needed for pain   pantoprazole 40 MG tablet Commonly known as: PROTONIX Take 1 tablet (40 mg total) by mouth daily before supper.   polyethylene glycol powder 17 GM/SCOOP powder Commonly known as: GLYCOLAX/MIRALAX Take 17 g by mouth 2 (two) times daily. Until daily soft stools OTC   rosuvastatin 5 MG tablet Commonly known as: CRESTOR Take 5 mg by mouth daily.   tamsulosin 0.4 MG Caps capsule Commonly known as: FLOMAX Take 1 capsule by mouth every day   venlafaxine XR 75 MG 24 hr capsule Commonly known as: EFFEXOR-XR Take 75 mg by mouth daily.   Vitamin D3 10 MCG (400 UNIT)  Caps Take 1 capsule (400 Units total) by mouth daily.               Durable Medical Equipment  (From admission, onward)           Start     Ordered   07/03/22 1332  DME Walker rolling  Once       Question Answer Comment  Walker: With 5 Inch Wheels   Patient needs a walker to treat with the following condition Closed right hip fracture (HCC)      07/03/22 1331            Contact information for follow-up providers     [facility] transportation. Call.   Why: Please call [facility] 7 days in advance of your dialysis sessions to schedule transportation to and from dialysis. Contact information: P: [phone]        Ellyn Hack, MD. Schedule an appointment as soon as possible for a visit in 1 week(s).   Specialty: Family Medicine Contact information: [address] [place] [address] [phone]              Contact information for after-discharge care     Destination     [facility] Preferred SNF .  Service: Skilled Nursing Contact information: [address] [place] [address] [phone]                    Discharge Exam: Ceasar Mons Weights   07/04/22 0924 07/07/22 0820 07/07/22 1300  Weight: 66.8 kg 67.7 kg 66.1 kg   General: Alert, no acute distress, well-appearing Cardio: Normal S1 and S2, RRR, no r/m/g Pulm: CTAB, normal work of breathing Abdomen: Bowel sounds normal. Abdomen soft and non-tender.  Extremities: No peripheral edema, right elbow in sling, right hip op site clean and dry Neuro: Cranial nerves grossly intact   Condition at discharge: good  The results of significant diagnostics from this hospitalization (including imaging, microbiology, ancillary and laboratory) are listed below for reference.   Imaging Studies: DG Foot Complete Left  Result Date: 07/04/2022 CLINICAL DATA:  Left toe pain EXAM: LEFT FOOT - COMPLETE 3+ VIEW COMPARISON:  None  Available. FINDINGS: Demineralization. Hallux valgus. No acute fracture or dislocation. Degenerative arthritis at the first MTP joint, the midfoot, and DIP joints. Soft tissues are radiographically unremarkable. IMPRESSION: 1. No acute fracture or dislocation. 2. Hallux valgus. Electronically Signed   By: Minerva Fester M.D.   On: 07/04/2022 19:28   DG ELBOW COMPLETE RIGHT (3+VIEW)  Result Date: 07/03/2022 CLINICAL DATA:  Right-sided elbow pain EXAM: RIGHT ELBOW - COMPLETE 4 VIEW COMPARISON:  None Available. FINDINGS: There is a mildly displaced transverse fracture of the base of the olecranon on lateral view. No additional fracture or dislocation. Preserved joint spaces and bone mineralization. Surgical clips along the soft tissues. IMPRESSION: Minimally displaced transverse fracture of the base of the olecranon. Electronically Signed   By: Karen Kays M.D.   On: 07/03/2022 13:07   DG FEMUR, MIN 2 VIEWS RIGHT  Result Date: 07/03/2022 CLINICAL DATA:  Intertrochanteric femur fracture fixation. EXAM: RIGHT FEMUR 2 VIEWS COMPARISON:  Radiographs 07/02/2022. FINDINGS: C-arm fluoroscopy was provided in the operating room without the presence of a radiologist.44.3 fluoroscopy time. 10.1 mGy air kerma. Six C-arm fluoroscopic images were obtained intraoperatively and are submitted for post operative interpretation. These images demonstrate the placement of a proximal right femoral intramedullary nail and dynamic screw across the intertrochanteric femur fracture. The hardware appears well positioned. There is improved alignment of the fracture, and no complications are identified. Please see intraoperative findings for further detail. IMPRESSION: Intraoperative fluoroscopic guidance for right femoral fracture fixation. Electronically Signed   By: Carey Bullocks M.D.   On: 07/03/2022 12:52   DG C-Arm 1-60 Min-No Report  Result Date: 07/03/2022 Fluoroscopy was utilized by the requesting physician.  No  radiographic interpretation.   DG Chest 1 View  Result Date: 07/02/2022 CLINICAL DATA:  Pain after fall.  History of gastric cancer EXAM: CHEST  1 VIEW COMPARISON:  X-ray 04/21/2022 FINDINGS: Left upper chest port with tip along the central SVC. No consolidation, pneumothorax or effusion. No edema. Normal cardiopericardial silhouette. Film is under penetrated. IMPRESSION: Chest port.  No acute cardiopulmonary disease. Electronically Signed   By: Karen Kays M.D.   On: 07/02/2022 14:07   DG Knee Complete 4 Views Right  Result Date: 07/02/2022 CLINICAL DATA:  Pain after fall EXAM: RIGHT KNEE - COMPLETE 4 VIEW COMPARISON:  None Available. FINDINGS: Osteopenia. Mild joint space loss of the medial compartment. No fracture or dislocation. Preserved bone mineralization. No joint effusion on lateral view. Chondrocalcinosis of the medial compartment. IMPRESSION: Mild degenerative changes of the medial compartment with chondrocalcinosis. Electronically Signed   By: Scarlette Shorts  Chales Abrahams M.D.   On: 07/02/2022 14:05   DG Hip Unilat W or Wo Pelvis 2-3 Views Right  Result Date: 07/02/2022 CLINICAL DATA:  Pain after fall EXAM: DG HIP (WITH OR WITHOUT PELVIS) 3V RIGHT COMPARISON:  None Available. FINDINGS: Mildly displaced oblique fracture involving the intertrochanteric region of the right hip. Osteopenia. No additional fracture or dislocation. Preserved joint spaces. Degenerative changes seen of the lumbar spine at the edge of the imaging field. IMPRESSION: Mildly displaced intertrochanteric right hip fracture Electronically Signed   By: Karen Kays M.D.   On: 07/02/2022 14:05   CT Head Wo Contrast  Result Date: 07/02/2022 CLINICAL DATA:  Head trauma, minor (Age >= 65y); Neck trauma (Age >= 65y) EXAM: CT HEAD WITHOUT CONTRAST CT CERVICAL SPINE WITHOUT CONTRAST TECHNIQUE: Multidetector CT imaging of the head and cervical spine was performed following the standard protocol without intravenous contrast. Multiplanar CT image  reconstructions of the cervical spine were also generated. RADIATION DOSE REDUCTION: This exam was performed according to the departmental dose-optimization program which includes automated exposure control, adjustment of the mA and/or kV according to patient size and/or use of iterative reconstruction technique. COMPARISON:  CT Head 02/16/21 FINDINGS: CT HEAD FINDINGS Brain: No evidence of acute infarction, hemorrhage, hydrocephalus, extra-axial collection or mass lesion/mass effect. Mineralization of the basal ganglia bilaterally. There is a chronic appearing infarct in the centrum semiovale on the right Vascular: No hyperdense vessel or unexpected calcification. Skull: Normal. Negative for fracture or focal lesion. Sinuses/Orbits: No middle ear or mastoid effusion. Paranasal sinuses are notable for mucosal thickening in the left maxillary sinus and bilateral sphenoid sinuses. Orbits are unremarkable. Other: None. CT CERVICAL SPINE FINDINGS Alignment: Normal. Skull base and vertebrae: No acute fracture. No primary bone lesion or focal pathologic process. Soft tissues and spinal canal: No prevertebral fluid or swelling. No visible canal hematoma. Disc levels:  No evidence of high-grade spinal canal stenosis. Upper chest: Incompletely imaged. Other: No IMPRESSION: 1. No acute intracranial abnormality. 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 2. No acute fracture or traumatic malalignment of the cervical spine. Electronically Signed   By: Lorenza Cambridge M.D.   On: 07/02/2022 13:55   CT Cervical Spine Wo Contrast  Result Date: 07/02/2022 CLINICAL DATA:  Head trauma, minor (Age >= 65y); Neck trauma (Age >= 65y) EXAM: CT HEAD WITHOUT CONTRAST CT CERVICAL SPINE WITHOUT CONTRAST TECHNIQUE: Multidetector CT imaging of the head and cervical spine was performed following the standard protocol without intravenous contrast. Multiplanar CT image reconstructions of the cervical spine were also generated.  RADIATION DOSE REDUCTION: This exam was performed according to the departmental dose-optimization program which includes automated exposure control, adjustment of the mA and/or kV according to patient size and/or use of iterative reconstruction technique. COMPARISON:  CT Head 02/16/21 FINDINGS: CT HEAD FINDINGS Brain: No evidence of acute infarction, hemorrhage, hydrocephalus, extra-axial collection or mass lesion/mass effect. Mineralization of the basal ganglia bilaterally. There is a chronic appearing infarct in the centrum semiovale on the right Vascular: No hyperdense vessel or unexpected calcification. Skull: Normal. Negative for fracture or focal lesion. Sinuses/Orbits: No middle ear or mastoid effusion. Paranasal sinuses are notable for mucosal thickening in the left maxillary sinus and bilateral sphenoid sinuses. Orbits are unremarkable. Other: None. CT CERVICAL SPINE FINDINGS Alignment: Normal. Skull base and vertebrae: No acute fracture. No primary bone lesion or focal pathologic process. Soft tissues and spinal canal: No prevertebral fluid or swelling. No visible canal hematoma. Disc levels:  No evidence of high-grade  spinal canal stenosis. Upper chest: Incompletely imaged. Other: No IMPRESSION: 1. No acute intracranial abnormality. 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 2. No acute fracture or traumatic malalignment of the cervical spine. Electronically Signed   By: Lorenza Cambridge M.D.   On: 07/02/2022 13:55    Microbiology: Results for orders placed or performed during the hospital encounter of 07/02/22  Surgical pcr screen     Status: None   Collection Time: 07/02/22  5:36 PM   Specimen: Nasal Mucosa; Nasal Swab  Result Value Ref Range Status   MRSA, PCR NEGATIVE NEGATIVE Final   Staphylococcus aureus NEGATIVE NEGATIVE Final    Comment: (NOTE) The Xpert SA Assay (FDA approved for NASAL specimens in patients 59 years of age and older), is one component of a  comprehensive surveillance program. It is not intended to diagnose infection nor to guide or monitor treatment. Performed at [facility], [address] [place], [place] [address]     Labs: CBC: Recent Labs  Lab 07/03/22 0104 07/04/22 0641 07/04/22 1730 07/05/22 0244 07/06/22 0047 07/07/22 0500 07/08/22 0109 07/09/22 0109  WBC 10.3 10.4  --  7.9 7.6 7.3  --   --   HGB 8.7* 7.0*   < > 8.5* 7.3* 7.0* 7.5* 7.4*  HCT 26.5* 21.7*   < > 26.2* 22.1* 21.7* 22.7* 23.1*  MCV 98.9 96.4  --  94.9 95.7 94.8  --   --   PLT 187 178  --  162 178 212  --   --    < > = values in this interval not displayed.   Basic Metabolic Panel: Recent Labs  Lab 07/04/22 0641 07/05/22 0244 07/06/22 0047 07/07/22 0736 07/08/22 0109 07/09/22 0109  NA 133* 135 134* 135 135 133*  K 4.5 4.0 3.9 3.6 3.7 3.7  CL 91* 97* 95* 97* 97* 97*  CO2 27 27 28 25 28 26   GLUCOSE 139* 99 93 124* 81 103*  BUN 39* 23 35* 49* 26* 37*  CREATININE 5.94* 3.80* 5.29* 6.74* 4.07* 5.67*  CALCIUM 8.4* 7.9* 8.2* 8.6* 8.3* 8.5*  PHOS 6.6* 3.5 5.0* 4.9* 3.0  --    Liver Function Tests: Recent Labs  Lab 07/04/22 0641 07/05/22 0244 07/06/22 0047 07/07/22 0736 07/08/22 0109  ALBUMIN 2.4* 2.4* 2.2* 2.2* 2.6*   CBG: Recent Labs  Lab 07/09/22 1335  GLUCAP 65*    Discharge time spent: less than 30 minutes.  Signed: Rolm Gala, MD [facility] 07/09/2022

## 2022-07-09 NOTE — TOC Transition Note (Signed)
Transition of Care [facility]) - CM/SW Discharge Note   Patient Details  Name: Jacob [name] MRN: [id number] Date of Birth: 1945-03-19  Transition [facility] Care [facility]) CM/SW Contact:  Lorri Frederick, LCSW Phone Number: 07/09/2022, 3:52 PM   Clinical Narrative:  Pt discharging to [facility].  RN call report to [phone].        Final next level of care: Skilled Nursing Facility Barriers to Discharge: Barriers Resolved   Patient Goals and CMS Choice      Discharge Placement                Patient chooses bed at:  [facility]) Patient to be transferred to facility by: [facility] Name of family member notified: Left message with wife Melodye Ped, spoke with DIL Debbie Patient and family notified of of transfer: 07/09/22  Discharge Plan and Services Additional resources added to the After Visit Summary for   In-house Referral: Clinical Social Work   Post Acute Care Choice: Skilled Nursing Facility                               Social Determinants of Health (SDOH) Interventions SDOH Screenings   Food Insecurity: No Food Insecurity (07/02/2022)  Housing: Low Risk  (07/02/2022)  Transportation Needs: No Transportation Needs (07/02/2022)  Utilities: Not At Risk (07/02/2022)  Tobacco Use: Medium Risk (07/03/2022)     Readmission Risk Interventions    12/22/2019    9:46 AM  Readmission Risk Prevention Plan  Transportation Screening Complete  PCP or Specialist Appt within 3-5 Days Not Complete  Not Complete comments first available appt on 12/23  HRI or Home Care Consult Complete  Social Work Consult for Recovery Care Planning/Counseling Complete  Palliative Care Screening Not Applicable

## 2022-07-09 NOTE — Progress Notes (Signed)
[facility] Progress Note   Subjective:   Seen in dialysis. Tolerating UF. Didn't sleep well, otherwise no complaints this am  Objective Vitals:   07/09/22 0900 07/09/22 0933 07/09/22 1000 07/09/22 1034  BP: 109/69 (!) 98/54 113/61 110/77  Pulse: 70 68 68 68  Resp: (!) 24 13 17 17   Temp:      TempSrc:      SpO2: 98% 100% 96% 99%  Weight:      Height:       Physical Exam General: well appearing, elderly male in NAD Heart: RRR, no mrg Lungs: CTAB, nml WOB on RA Abdomen: soft, NTND Extremities: no LE edema, R arm in splint Dialysis Access: RU AVF +b/t   Filed Weights   07/04/22 0924 07/07/22 0820 07/07/22 1300  Weight: 66.8 kg 67.7 kg 66.1 kg    Intake/Output Summary (Last 24 hours) at 07/09/2022 1038 Last data filed at 07/09/2022 0723 Gross per 24 hour  Intake 594 ml  Output 300 ml  Net 294 ml     Additional Objective Labs: Basic Metabolic Panel: Recent Labs  Lab 07/06/22 0047 07/07/22 0736 07/08/22 0109 07/09/22 0109  NA 134* 135 135 133*  K 3.9 3.6 3.7 3.7  CL 95* 97* 97* 97*  CO2 28 25 28 26   GLUCOSE 93 124* 81 103*  BUN 35* 49* 26* 37*  CREATININE 5.29* 6.74* 4.07* 5.67*  CALCIUM 8.2* 8.6* 8.3* 8.5*  PHOS 5.0* 4.9* 3.0  --     Liver Function Tests: Recent Labs  Lab 07/02/22 1339 07/04/22 0641 07/06/22 0047 07/07/22 0736 07/08/22 0109  AST 13*  --   --   --   --   ALT 8  --   --   --   --   ALKPHOS 46  --   --   --   --   BILITOT 0.5  --   --   --   --   PROT 6.1*  --   --   --   --   ALBUMIN 2.8*   < > 2.2* 2.2* 2.6*   < > = values in this interval not displayed.    CBC: Recent Labs  Lab 07/02/22 1339 07/02/22 1352 07/03/22 0104 07/04/22 0641 07/04/22 1730 07/05/22 0244 07/06/22 0047 07/07/22 0500 07/08/22 0109 07/09/22 0109  WBC 8.1  --  10.3 10.4  --  7.9 7.6 7.3  --   --   NEUTROABS 6.2  --   --   --   --   --   --   --   --   --   HGB 9.2*   < > 8.7* 7.0*   < > 8.5* 7.3* 7.0* 7.5* 7.4*  HCT 28.5*   < >  26.5* 21.7*   < > 26.2* 22.1* 21.7* 22.7* 23.1*  MCV 100.0  --  98.9 96.4  --  94.9 95.7 94.8  --   --   PLT 187  --  187 178  --  162 178 212  --   --    < > = values in this interval not displayed.    Blood Culture    Component Value Date/Time   SDES BLOOD LEFT HAND 09/09/2018 1122   SPECREQUEST  09/09/2018 1122    BOTTLES DRAWN AEROBIC ONLY Blood Culture results may not be optimal due to an inadequate volume of blood received in culture bottles   CULT  09/09/2018 1122    NO GROWTH 5 DAYS Performed  at [facility], [address] [place], [place] [address]    REPTSTATUS 09/14/2018 FINAL 09/09/2018 1122   Iron Studies:  No results for input(s): "IRON", "TIBC", "TRANSFERRIN", "FERRITIN" in the last 72 hours.  Lab Results  Component Value Date   INR 1.1 02/21/2022   INR 1.1 02/16/2021   INR 1.1 05/11/2020   Studies/Results: No results found.  Medications:  ferric gluconate (FERRLECIT) IVPB 125 mg (07/07/22 1100)   methocarbamol (ROBAXIN) IV      allopurinol  150 mg Oral Daily   aspirin EC  81 mg Oral Daily   buPROPion  300 mg Oral Daily   calcitRIOL  0.5 mcg Oral Q M,W,F-HD   Chlorhexidine Gluconate Cloth  6 each Topical Q0600   Chlorhexidine Gluconate Cloth  6 each Topical Q0600   cinacalcet  30 mg Oral Q M,W,F-1800   darbepoetin (ARANESP) injection - DIALYSIS  100 mcg Subcutaneous Q Fri-1800   donepezil  5 mg Oral QHS   folic acid  1 mg Oral Daily   heparin injection (subcutaneous)  5,000 Units Subcutaneous Q8H   heparin sodium (porcine)       loratadine  10 mg Oral Daily   melatonin  10 mg Oral QHS   multivitamin  1 tablet Oral QHS   nebivolol  20 mg Oral QHS   pantoprazole  40 mg Oral QAC supper   polyethylene glycol  17 g Oral BID   rosuvastatin  5 mg Oral Daily   senna-docusate  1 tablet Oral BID   sevelamer carbonate  800 mg Oral TID WC   sodium chloride flush  10-40 mL Intracatheter Q12H   tamsulosin  0.4 mg Oral Daily   venlafaxine XR  75 mg  Oral Daily    Dialysis Orders: [facility] MWF 4:00  400/A1.5x EDW 86kg 2K/2Ca AVG  Heparin 1500 Mircera 75 q 2 wks -last 6/12 Calcitriol 0.5 TIW, Sensipar 30 TIW   Assessment/Plan: R hip fracture d/t mechanical fall - Ortho consulted. S/p repair 07/03/22 R olecranon Frx - splint in place.   ESRD -  HD MWF. Continue on schedule. HD today   Hypertension/volume  - BP remains soft today without antihypertensives.  Holding parameters in place on meds.  Volume ok.  Anemia  - Hgb 7.5. on ESA, 1 unit pRBC given 6/21. Tsat 11%, getting IV iron. Aranesp q Friday - increase next dose. Follow trends Metabolic bone disease - Corrected Ca and phos in goal. Continue binders/calcitriol/Sensipar. Nutrition - Renal diet/Prot supp Dispo - For SNF placement   Nicholis Stepanek Susann Givens PA-C [facility] 07/09/2022,10:38 AM

## 2022-07-09 NOTE — Progress Notes (Signed)
Orthopedic Tech Progress Note Patient Details:  Jacob [name] [birth year] [id number]  Ortho tech applied long arm yesterday with family at bedside. (I did not apply it)  Patient ID: Jacob [name], male   DOB: 02-17-1945, 77 y.o.   MRN: [id number]  [name] 07/09/2022, 7:39 AM

## 2022-07-09 NOTE — TOC Progression Note (Addendum)
Transition of Care [facility]) - Progression Note    Patient Details  Name: [name] MRN: [id number] Date of Birth: 1945/06/28  Transition of Care [facility]) CM/SW Contact  Lorri Frederick, LCSW Phone Number: 07/09/2022, 9:40 AM  Clinical Narrative:   SNF auth submitted in navi and approved: 6/26 - 6/28 [employer] Auth ID [id number].  MD informed.  CSW confirmed with [facility] that she can receive pt today.       Expected Discharge Plan: Skilled Nursing Facility Barriers to Discharge: SNF Pending bed offer  Expected Discharge Plan and Services In-house Referral: Clinical Social Work   Post Acute Care Choice: Skilled Nursing Facility Living arrangements for the past 2 months: Single Family Home                                       Social Determinants of Health (SDOH) Interventions SDOH Screenings   Food Insecurity: No Food Insecurity (07/02/2022)  Housing: Low Risk  (07/02/2022)  Transportation Needs: No Transportation Needs (07/02/2022)  Utilities: Not At Risk (07/02/2022)  Tobacco Use: Medium Risk (07/03/2022)    Readmission Risk Interventions    12/22/2019    9:46 AM  Readmission Risk Prevention Plan  Transportation Screening Complete  PCP or Specialist Appt within 3-5 Days Not Complete  Not Complete comments first available appt on 12/23  HRI or Home Care Consult Complete  Social Work Consult for Recovery Care Planning/Counseling Complete  Palliative Care Screening Not Applicable

## 2022-07-09 NOTE — Progress Notes (Signed)
Physical Therapy Treatment Patient Details Name: Jacob [name] MRN: [id number] DOB: [birth year] Today's Date: 07/09/2022   History of Present Illness 77 y.o. male presenting with fall 6/19, found to have R hip and Rt elbow fx. He is now s/p R hip IMN 07/03/22. Non-op R olecranon fx splinted. NWB RUE with WBAT RLE. PMH of ESRD on HD, HTN, HLD, gout, hx prostate cancer s/p radiation.    PT Comments    Pt making gradual progress.  Has no c/o pain in R UE and required frequent cues for NWB.  Pt with good pain control in R LE at rest and with exercises, but has difficulty with weight bearing and unable to take steps despite assist and cues for hemiwalker use.  Pt was able to pivot with mod A of 2 for safety.  Continue plan of care.     Recommendations for follow up therapy are one component of a multi-disciplinary discharge planning process, led by the attending physician.  Recommendations may be updated based on patient status, additional functional criteria and insurance authorization.  Follow Up Recommendations  Can patient physically be transported by private vehicle: No    Assistance Recommended at Discharge Frequent or constant Supervision/Assistance  Patient can return home with the following Two people to help with walking and/or transfers;Assistance with cooking/housework;Assist for transportation;Help with stairs or ramp for entrance;Two people to help with bathing/dressing/bathroom   Equipment Recommendations  Other (comment) (defer to post acute)    Recommendations for Other Services       Precautions / Restrictions Precautions Precautions: Fall Required Braces or Orthoses: Splint/Cast;Sling Splint/Cast: right long arm splint Restrictions RUE Weight Bearing: Non weight bearing RLE Weight Bearing: Weight bearing as tolerated     Mobility  Bed Mobility Overal bed mobility: Needs Assistance Bed Mobility: Supine to Sit     Supine to sit: Min assist, HOB elevated      General bed mobility comments: Min A for R LE for pain control, increased time and cues to not push with R UE    Transfers Overall transfer level: Needs assistance Equipment used: Hemi-walker Transfers: Sit to/from Stand, Bed to chair/wheelchair/BSC Sit to Stand: +2 physical assistance, Mod assist, From elevated surface Stand pivot transfers: Mod assist, +2 physical assistance         General transfer comment: Performed sit to stand x 2 with cues to scoot forward and mod A of 2 to rise.  Cues to avoid pushing with R UE.  Pt attempted step pivot to chair with hemiwalker but too painful to weight bear on R LE and had to stand pivot.    Ambulation/Gait Ambulation/Gait assistance: Mod assist, +2 physical assistance Gait Distance (Feet): 1 Feet Assistive device: Hemi-walker Gait Pattern/deviations: Step-to pattern, Decreased stride length, Decreased weight shift to right, Shuffle Gait velocity: decreased     General Gait Details: Tried steps forward with cues to push into hemiwalker during R stance but pt still painful and only taking a few shuffle steps   Stairs             Wheelchair Mobility    Modified Rankin (Stroke Patients Only)       Balance Overall balance assessment: Needs assistance Sitting-balance support: Feet supported Sitting balance-Leahy Scale: Fair Sitting balance - Comments: No LOB with static sitting   Standing balance support: Single extremity supported Standing balance-Leahy Scale: Poor Standing balance comment: Reliant on hemiwalker and min A at times.  Pt tending to lean forward requiring cues for upright posture  Cognition Arousal/Alertness: Awake/alert Behavior During Therapy: WFL for tasks assessed/performed Overall Cognitive Status: No family/caregiver present to determine baseline cognitive functioning                                 General Comments: Pt required multiple cues  for his WB precautions        Exercises General Exercises - Lower Extremity Ankle Circles/Pumps: PROM, Left, AROM, Right, 10 reps, Supine (reports foot drop L baseline) Quad Sets: AROM, Both, 10 reps, Supine Heel Slides: AROM, Left, AAROM, Right, 10 reps, Supine Hip ABduction/ADduction: AROM, Left, AAROM, Right, 10 reps, Supine    General Comments General comments (skin integrity, edema, etc.): VSS      Pertinent Vitals/Pain Pain Assessment Pain Assessment: Faces Faces Pain Scale: Hurts even more Pain Location: right hip with WB Pain Descriptors / Indicators: Discomfort Pain Intervention(s): Limited activity within patient's tolerance, Monitored during session, Repositioned    Home Living                          Prior Function            PT Goals (current goals can now be found in the care plan section) Progress towards PT goals: Progressing toward goals    Frequency    Min 3X/week      PT Plan Current plan remains appropriate    Co-evaluation              AM-PAC PT "6 Clicks" Mobility   Outcome Measure  Help needed turning from your back to your side while in a flat bed without using bedrails?: A Little Help needed moving from lying on your back to sitting on the side of a flat bed without using bedrails?: A Little Help needed moving to and from a bed to a chair (including a wheelchair)?: Total Help needed standing up from a chair using your arms (e.g., wheelchair or bedside chair)?: Total Help needed to walk in hospital room?: Total Help needed climbing 3-5 steps with a railing? : Total 6 Click Score: 10    End of Session Equipment Utilized During Treatment: Gait belt Activity Tolerance: Patient tolerated treatment well Patient left: in chair;with call bell/phone within reach;with chair alarm set Nurse Communication: Mobility status;Weight bearing status (assist of 2 for pivot vs STEDY) PT Visit Diagnosis: Unsteadiness on feet  (R26.81);Muscle weakness (generalized) (M62.81);History of falling (Z91.81);Difficulty in walking, not elsewhere classified (R26.2);Pain Pain - Right/Left: Right Pain - part of body: Hip;Arm     Time: 0865-7846 PT Time Calculation (min) (ACUTE ONLY): 29 min  Charges:  $Therapeutic Exercise: 8-22 mins $Therapeutic Activity: 8-22 mins                     Anise Salvo, PT Acute Rehab [facility] [phone]    Rayetta Humphrey 07/09/2022, 3:08 PM

## 2022-07-09 NOTE — Progress Notes (Signed)
D/C order noted. Contacted [facility] to advise clinic of pt's d/c today and that pt should resume care on Friday. Clinic advised pt will be going to [facility] as well.   Olivia Canter Renal Navigator [phone]

## 2022-07-09 NOTE — Progress Notes (Signed)
Report given to staff nurse at [facility], all questions and concerns were fully addressed.

## 2022-07-09 NOTE — Procedures (Signed)
HD Note:  Some information was entered later than the data was gathered due to patient care needs. The stated time with the data is accurate.  Received patient in bed to unit.  Alert and oriented. Informed consent signed and in chart.   TX duration: 3.5 hours  Patient had an episode of hypotension with no signs or symptoms of distress, UF turned off, see flowsheet  Transported back to the room  Alert, without acute distress.  Hand-off given to patient's nurse.   Access used: Upper right right arm fistula Access issues: None  Total UF removed: 2000 ml    [facility]

## 2022-07-10 NOTE — Plan of Care (Signed)
[name]  Patient's name: Jacob [name] Admit/DC Dates: 07/02/2022 - 07/09/2022  Discharge Diagnoses: R hip fracture/R elbow fracture secondary to fall    Aranesp: Given: yes     Date and amount of last dose: 6/21, Last Hgb: 7.4 PRBC's Given: Yes  Date/# of units: 6/19, 1 U ESA dose for discharge: Mircera 100 mcg IV q 2 weeks  IV Iron dose at discharge: Venofer 100 mg IV x 5  Heparin change: No change  EDW Change: -- New EDW:   Bath Change: --  Access intervention/Change: -- Details:  Hectorol/Calcitriol change: --  Discharge Labs: Calcium 8.5 Phosphorus 3.0 Albumin 2.6 K+ 3.7  IV Antibiotics: -- Details:  On Coumadin?: --- Last INR: Next INR: Managed By:   OTHER/APPTS/LAB ORDERS: Discharge to [facility] SNF   D/C Meds to be reconciled by nurse after every discharge.  Completed By: Tomasa Blase PA-C [facility] 07/10/2022,2:16 PM   Reviewed by: MD:______ RN_______

## 2022-12-26 ENCOUNTER — Inpatient Hospital Stay: Payer: Medicare Other | Attending: Oncology

## 2022-12-26 ENCOUNTER — Other Ambulatory Visit: Payer: Self-pay | Admitting: Oncology

## 2022-12-26 ENCOUNTER — Other Ambulatory Visit: Payer: Self-pay

## 2022-12-26 ENCOUNTER — Inpatient Hospital Stay: Payer: Medicare Other | Admitting: Oncology

## 2022-12-26 DIAGNOSIS — D472 Monoclonal gammopathy: Secondary | ICD-10-CM

## 2022-12-26 DIAGNOSIS — Z85028 Personal history of other malignant neoplasm of stomach: Secondary | ICD-10-CM

## 2022-12-26 DIAGNOSIS — C61 Malignant neoplasm of prostate: Secondary | ICD-10-CM

## 2022-12-26 NOTE — Progress Notes (Signed)
Called patient to make him aware that he missed his appointment with Dr. Arlana Pouch, NA was unable to leave a VM. Will have patient rescheduled.

## 2023-04-22 ENCOUNTER — Other Ambulatory Visit: Payer: Self-pay | Admitting: Urology

## 2023-10-26 ENCOUNTER — Emergency Department (HOSPITAL_COMMUNITY)

## 2023-10-26 ENCOUNTER — Inpatient Hospital Stay (HOSPITAL_COMMUNITY)
Admission: EM | Admit: 2023-10-26 | Discharge: 2023-10-28 | DRG: 444 | Disposition: A | Discharge status: Other Acute Inpatient Hospital | Attending: Internal Medicine | Admitting: Internal Medicine

## 2023-10-26 ENCOUNTER — Encounter (HOSPITAL_COMMUNITY): Payer: Self-pay

## 2023-10-26 ENCOUNTER — Other Ambulatory Visit: Payer: Self-pay

## 2023-10-26 DIAGNOSIS — Z903 Acquired absence of stomach [part of]: Secondary | ICD-10-CM

## 2023-10-26 DIAGNOSIS — R1013 Epigastric pain: Secondary | ICD-10-CM | POA: Diagnosis present

## 2023-10-26 DIAGNOSIS — Z87891 Personal history of nicotine dependence: Secondary | ICD-10-CM

## 2023-10-26 DIAGNOSIS — Z860100 Personal history of colon polyps, unspecified: Secondary | ICD-10-CM

## 2023-10-26 DIAGNOSIS — Z980 Intestinal bypass and anastomosis status: Secondary | ICD-10-CM | POA: Diagnosis not present

## 2023-10-26 DIAGNOSIS — R7401 Elevation of levels of liver transaminase levels: Secondary | ICD-10-CM | POA: Diagnosis present

## 2023-10-26 DIAGNOSIS — I120 Hypertensive chronic kidney disease with stage 5 chronic kidney disease or end stage renal disease: Secondary | ICD-10-CM | POA: Diagnosis present

## 2023-10-26 DIAGNOSIS — A084 Viral intestinal infection, unspecified: Secondary | ICD-10-CM | POA: Diagnosis present

## 2023-10-26 DIAGNOSIS — D631 Anemia in chronic kidney disease: Secondary | ICD-10-CM | POA: Diagnosis present

## 2023-10-26 DIAGNOSIS — Z8673 Personal history of transient ischemic attack (TIA), and cerebral infarction without residual deficits: Secondary | ICD-10-CM

## 2023-10-26 DIAGNOSIS — E11649 Type 2 diabetes mellitus with hypoglycemia without coma: Secondary | ICD-10-CM | POA: Diagnosis present

## 2023-10-26 DIAGNOSIS — Z9221 Personal history of antineoplastic chemotherapy: Secondary | ICD-10-CM

## 2023-10-26 DIAGNOSIS — C169 Malignant neoplasm of stomach, unspecified: Secondary | ICD-10-CM | POA: Diagnosis present

## 2023-10-26 DIAGNOSIS — E785 Hyperlipidemia, unspecified: Secondary | ICD-10-CM | POA: Diagnosis not present

## 2023-10-26 DIAGNOSIS — M1990 Unspecified osteoarthritis, unspecified site: Secondary | ICD-10-CM | POA: Diagnosis present

## 2023-10-26 DIAGNOSIS — R7989 Other specified abnormal findings of blood chemistry: Secondary | ICD-10-CM | POA: Diagnosis not present

## 2023-10-26 DIAGNOSIS — F32A Depression, unspecified: Secondary | ICD-10-CM | POA: Diagnosis present

## 2023-10-26 DIAGNOSIS — I4892 Unspecified atrial flutter: Principal | ICD-10-CM | POA: Diagnosis present

## 2023-10-26 DIAGNOSIS — E7800 Pure hypercholesterolemia, unspecified: Secondary | ICD-10-CM | POA: Diagnosis present

## 2023-10-26 DIAGNOSIS — K8070 Calculus of gallbladder and bile duct without cholecystitis without obstruction: Secondary | ICD-10-CM | POA: Diagnosis present

## 2023-10-26 DIAGNOSIS — G4733 Obstructive sleep apnea (adult) (pediatric): Secondary | ICD-10-CM | POA: Diagnosis present

## 2023-10-26 DIAGNOSIS — F419 Anxiety disorder, unspecified: Secondary | ICD-10-CM | POA: Diagnosis present

## 2023-10-26 DIAGNOSIS — D472 Monoclonal gammopathy: Secondary | ICD-10-CM | POA: Diagnosis present

## 2023-10-26 DIAGNOSIS — Z85028 Personal history of other malignant neoplasm of stomach: Secondary | ICD-10-CM | POA: Diagnosis not present

## 2023-10-26 DIAGNOSIS — Z992 Dependence on renal dialysis: Secondary | ICD-10-CM | POA: Diagnosis not present

## 2023-10-26 DIAGNOSIS — Z8711 Personal history of peptic ulcer disease: Secondary | ICD-10-CM

## 2023-10-26 DIAGNOSIS — K8050 Calculus of bile duct without cholangitis or cholecystitis without obstruction: Secondary | ICD-10-CM | POA: Diagnosis not present

## 2023-10-26 DIAGNOSIS — I2510 Atherosclerotic heart disease of native coronary artery without angina pectoris: Secondary | ICD-10-CM | POA: Diagnosis present

## 2023-10-26 DIAGNOSIS — N529 Male erectile dysfunction, unspecified: Secondary | ICD-10-CM | POA: Diagnosis present

## 2023-10-26 DIAGNOSIS — N186 End stage renal disease: Secondary | ICD-10-CM | POA: Diagnosis present

## 2023-10-26 DIAGNOSIS — R109 Unspecified abdominal pain: Secondary | ICD-10-CM | POA: Diagnosis not present

## 2023-10-26 DIAGNOSIS — M109 Gout, unspecified: Secondary | ICD-10-CM | POA: Diagnosis present

## 2023-10-26 DIAGNOSIS — E1122 Type 2 diabetes mellitus with diabetic chronic kidney disease: Secondary | ICD-10-CM | POA: Diagnosis present

## 2023-10-26 DIAGNOSIS — C61 Malignant neoplasm of prostate: Secondary | ICD-10-CM | POA: Diagnosis present

## 2023-10-26 DIAGNOSIS — Z923 Personal history of irradiation: Secondary | ICD-10-CM | POA: Diagnosis not present

## 2023-10-26 DIAGNOSIS — E162 Hypoglycemia, unspecified: Secondary | ICD-10-CM | POA: Diagnosis present

## 2023-10-26 DIAGNOSIS — R1010 Upper abdominal pain, unspecified: Secondary | ICD-10-CM | POA: Diagnosis not present

## 2023-10-26 DIAGNOSIS — Z79899 Other long term (current) drug therapy: Secondary | ICD-10-CM

## 2023-10-26 DIAGNOSIS — I10 Essential (primary) hypertension: Secondary | ICD-10-CM | POA: Diagnosis present

## 2023-10-26 DIAGNOSIS — I440 Atrioventricular block, first degree: Secondary | ICD-10-CM | POA: Diagnosis present

## 2023-10-26 DIAGNOSIS — R0989 Other specified symptoms and signs involving the circulatory and respiratory systems: Secondary | ICD-10-CM | POA: Diagnosis present

## 2023-10-26 DIAGNOSIS — Z888 Allergy status to other drugs, medicaments and biological substances status: Secondary | ICD-10-CM

## 2023-10-26 DIAGNOSIS — I4719 Other supraventricular tachycardia: Secondary | ICD-10-CM | POA: Diagnosis present

## 2023-10-26 DIAGNOSIS — E871 Hypo-osmolality and hyponatremia: Secondary | ICD-10-CM | POA: Diagnosis present

## 2023-10-26 DIAGNOSIS — Z8546 Personal history of malignant neoplasm of prostate: Secondary | ICD-10-CM | POA: Diagnosis not present

## 2023-10-26 DIAGNOSIS — K219 Gastro-esophageal reflux disease without esophagitis: Secondary | ICD-10-CM | POA: Diagnosis present

## 2023-10-26 DIAGNOSIS — Z934 Other artificial openings of gastrointestinal tract status: Secondary | ICD-10-CM

## 2023-10-26 DIAGNOSIS — N401 Enlarged prostate with lower urinary tract symptoms: Secondary | ICD-10-CM | POA: Diagnosis present

## 2023-10-26 DIAGNOSIS — R000 Tachycardia, unspecified: Secondary | ICD-10-CM | POA: Diagnosis not present

## 2023-10-26 DIAGNOSIS — Z7982 Long term (current) use of aspirin: Secondary | ICD-10-CM

## 2023-10-26 DIAGNOSIS — N138 Other obstructive and reflux uropathy: Secondary | ICD-10-CM | POA: Diagnosis present

## 2023-10-26 DIAGNOSIS — Z8739 Personal history of other diseases of the musculoskeletal system and connective tissue: Secondary | ICD-10-CM

## 2023-10-26 LAB — COMPREHENSIVE METABOLIC PANEL WITH GFR
ALT: 69 U/L — ABNORMAL HIGH (ref 0–44)
AST: 92 U/L — ABNORMAL HIGH (ref 15–41)
Albumin: 3 g/dL — ABNORMAL LOW (ref 3.5–5.0)
Alkaline Phosphatase: 169 U/L — ABNORMAL HIGH (ref 38–126)
Anion gap: 11 (ref 5–15)
BUN: 16 mg/dL (ref 8–23)
CO2: 20 mmol/L — ABNORMAL LOW (ref 22–32)
Calcium: 8.2 mg/dL — ABNORMAL LOW (ref 8.9–10.3)
Chloride: 104 mmol/L (ref 98–111)
Creatinine, Ser: 3.77 mg/dL — ABNORMAL HIGH (ref 0.61–1.24)
GFR, Estimated: 16 mL/min — ABNORMAL LOW (ref >60)
Glucose, Bld: 64 mg/dL — ABNORMAL LOW (ref 70–99)
Potassium: 4.4 mmol/L (ref 3.5–5.1)
Sodium: 135 mmol/L (ref 135–145)
Total Bilirubin: 2.4 mg/dL — ABNORMAL HIGH (ref 0.0–1.2)
Total Protein: 6.5 g/dL (ref 6.5–8.1)

## 2023-10-26 LAB — CBC WITH DIFFERENTIAL/PLATELET
Abs Immature Granulocytes: 0.02 K/uL (ref 0.00–0.07)
Basophils Absolute: 0 K/uL (ref 0.0–0.1)
Basophils Relative: 0 %
Eosinophils Absolute: 0.2 K/uL (ref 0.0–0.5)
Eosinophils Relative: 4 %
HCT: 33.5 % — ABNORMAL LOW (ref 39.0–52.0)
Hemoglobin: 10.7 g/dL — ABNORMAL LOW (ref 13.0–17.0)
Immature Granulocytes: 0 %
Lymphocytes Relative: 8 %
Lymphs Abs: 0.4 K/uL — ABNORMAL LOW (ref 0.7–4.0)
MCH: 31.4 pg (ref 26.0–34.0)
MCHC: 31.9 g/dL (ref 30.0–36.0)
MCV: 98.2 fL (ref 80.0–100.0)
Monocytes Absolute: 0.5 K/uL (ref 0.1–1.0)
Monocytes Relative: 9 %
Neutro Abs: 4.5 K/uL (ref 1.7–7.7)
Neutrophils Relative %: 79 %
Platelets: 156 K/uL (ref 150–400)
RBC: 3.41 MIL/uL — ABNORMAL LOW (ref 4.22–5.81)
RDW: 16 % — ABNORMAL HIGH (ref 11.5–15.5)
WBC: 5.7 K/uL (ref 4.0–10.5)
nRBC: 0 % (ref 0.0–0.2)

## 2023-10-26 LAB — LIPASE, BLOOD: Lipase: 43 U/L (ref 11–51)

## 2023-10-26 LAB — TROPONIN I (HIGH SENSITIVITY)
Troponin I (High Sensitivity): 26 ng/L — ABNORMAL HIGH (ref <18)
Troponin I (High Sensitivity): 24 ng/L — ABNORMAL HIGH (ref <18)

## 2023-10-26 LAB — CBG MONITORING, ED
Glucose-Capillary: 47 mg/dL — ABNORMAL LOW (ref 70–99)
Glucose-Capillary: 65 mg/dL — ABNORMAL LOW (ref 70–99)

## 2023-10-26 LAB — MAGNESIUM: Magnesium: 2.1 mg/dL (ref 1.7–2.4)

## 2023-10-26 LAB — TSH: TSH: 1.366 u[IU]/mL (ref 0.350–4.500)

## 2023-10-26 LAB — BRAIN NATRIURETIC PEPTIDE: B Natriuretic Peptide: 122.1 pg/mL — ABNORMAL HIGH (ref 0.0–100.0)

## 2023-10-26 LAB — HEMOGLOBIN AND HEMATOCRIT, BLOOD
HCT: 32.5 % — ABNORMAL LOW (ref 39.0–52.0)
Hemoglobin: 10.5 g/dL — ABNORMAL LOW (ref 13.0–17.0)

## 2023-10-26 MED ORDER — IOHEXOL 350 MG/ML SOLN
70.0000 mL | Freq: Once | INTRAVENOUS | Status: AC | PRN
  Administered 2023-10-26: 70 mL via INTRAVENOUS

## 2023-10-26 MED ORDER — METOPROLOL TARTRATE 25 MG PO TABS
12.5000 mg | ORAL_TABLET | Freq: Once | ORAL | Status: AC
  Administered 2023-10-26: 12.5 mg via ORAL
  Filled 2023-10-26: qty 1

## 2023-10-26 MED ORDER — DEXTROSE 10 % IV SOLN: INTRAVENOUS | Status: DC

## 2023-10-26 NOTE — Consult Note (Signed)
 Cardiology Consult Note:    Patient ID: Jacob [name] MRN: [id number]; DOB: [birth year]   Admission date: 10/26/2023  Primary Care Provider: [name], MD Primary Cardiologist: Dub Huntsman, DO  Primary Electrophysiologist:  None   Patient Profile:   Jacob [name] is a 78 y.o. male with gastric adenocarcinoma, ESRD, G1DD, HTN, gout   History of Present Illness:   Mr. [name] presents to the emergency department with 3 days of poor appetite, abdominal pain, and loose stools. He went to the urgent care for the symptoms and was found to be in a 2-1 atrial tachycardia. He was transferred to the emergency room for further management. He has no symptoms of chest pain or palpitations. He states that earlier today he felt his heart rate had increased, but he does not currently have any symptoms.  Upon arrival to the emergency department he was afebrile, with a heart rate of 123 bpm, blood pressure of 141/89, normal saturations on room air.  EKG with atrial tachycardia rate of 119 bpm.  Hemoglobin 10.7 last known hemoglobin was in 2024 at 7.4, creatinine 3.77, AST/ALT 92/69, troponin 24 and 26. 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, has had transfusion   Anxiety and depression    Arthritis    gout   Back pain    BPH with obstruction/lower urinary tract symptoms    Chronic kidney disease    Stage 5 kidney   Constipation    ED (erectile dysfunction)    GERD (gastroesophageal reflux disease)    Hypercholesterolemia    Hypertension    Night sweats    Post-operative nausea and vomiting 04/09/2016   Prostate cancer (HCC) 08/14/2011   Adenocarcinoma,gleason:3+3=6,& 3+4=7,PSA=5.66, s/p radiation therapy   PUD (peptic ulcer disease)    Sleep apnea    does not use Cpap but occasionally uses O2 @ 2L qhs   Stomach cancer (HCC)    Ulcer    peptic ulcer hx    Past Surgical History:  Procedure Laterality Date   AV FISTULA  PLACEMENT Right 02/19/2021   Procedure: ARTERIOVENOUS (AV) FISTULA CREATION;  Surgeon: Lanis Fonda BRAVO, MD;  Location: [facility] OR;  Service: Vascular;  Laterality: Right;   AV FISTULA PLACEMENT Right 04/21/2022   Procedure: INSERTION OF RIGHT ARM ARTERIOVENOUS (AV) GORE-TEX GRAFT;  Surgeon: Lanis Fonda BRAVO, MD;  Location: [facility] OR;  Service: Vascular;  Laterality: Right;   BASCILIC VEIN TRANSPOSITION Right 07/23/2021   Procedure: RIGHT ARM BASILIC VEIN TRANSPOSITION;  Surgeon: Lanis Fonda BRAVO, MD;  Location: [facility] OR;  Service: Vascular;  Laterality: Right;  PERIPHERAL NERVE BLOCK   colon polyps bx  11/24/06   colon,transverse and rectosigmoid polyps:tubular adenomas and hyperplastic polyps,no high grade dysplasia or malignancy    COLONOSCOPY WITH PROPOFOL  N/A 08/24/2019   Procedure: COLONOSCOPY WITH PROPOFOL ;  Surgeon: San Sandor GAILS, DO;  Location: [facility];  Service: Gastroenterology;  Laterality: N/A;   duodenal bx  11/24/06   benign   ESOPHAGOGASTRODUODENOSCOPY N/A 10/27/2015   Procedure: ESOPHAGOGASTRODUODENOSCOPY (EGD);  Surgeon: Lupita BRAVO Commander, MD;  Location: [facility];  Service: Endoscopy;  Laterality: N/A;   EUS N/A 11/08/2015   Procedure: UPPER ENDOSCOPIC ULTRASOUND (EUS) RADIAL;  Surgeon: Toribio SHAUNNA Cedar, MD;  Location: [facility];  Service: Endoscopy;  Laterality: N/A;   GASTRECTOMY N/A 03/25/2016   Procedure: DISTAL GASTRECTOMY;  Surgeon: Jina Nephew, MD;  Location: [facility] OR;  Service: General;  Laterality: N/A;   gastric bx  11/24/06   chronic active gastritis,with metaplasia and focal changaes of xanthelasma   GASTROSTOMY N/A 03/25/2016   Procedure: INSERTION OF FEEDING TUBE;  Surgeon: Jina Nephew, MD;  Location: [facility] OR;  Service: General;  Laterality: N/A;   INSERTION OF DIALYSIS CATHETER Left 04/21/2022   Procedure: INSERTION OF TUNNELED DIALYSIS CATHETER, LEFT INTERNAL JUGULAR;  Surgeon: Lanis Fonda BRAVO, MD;  Location: [facility] OR;  Service: Vascular;  Laterality: Left;   INSERTION  PROSTATE RADIATION SEED  12-29-11   INTRAMEDULLARY (IM) NAIL INTERTROCHANTERIC Right 07/03/2022   Procedure: OPEN TREATMENT RIGHT HIP FRACTURE;  Surgeon: Elsa Lonni SAUNDERS, MD;  Location: [facility] OR;  Service: Orthopedics;  Laterality: Right;   IR GENERIC HISTORICAL  04/01/2016   IR GJ TUBE CHANGE 04/01/2016 Juliene Balder, MD [facility]   IR GENERIC HISTORICAL  04/05/2016   IR GJ TUBE CHANGE 04/05/2016 Juliene Balder, MD [facility]   LAPAROSCOPIC GASTRECTOMY  03/25/2016   Diagnostic laparoscopy, distal gastrectomy with Billroth 2 reconstruction and gastrojejunostomy tube   LAPAROSCOPY N/A 03/25/2016   Procedure: DIAGNOSTIC LAPAROSCOPY;  Surgeon: Jina Nephew, MD;  Location: [facility] OR;  Service: General;  Laterality: N/A;   POLYPECTOMY  08/24/2019   Procedure: POLYPECTOMY;  Surgeon: San Sandor GAILS, DO;  Location: [facility];  Service: Gastroenterology;;   PORTACATH PLACEMENT Left 12/04/2015   Procedure: INSERTION PORT-A-CATH;  Surgeon: Jina Nephew, MD;  Location: [facility] OR;  Service: General;  Laterality: Left;   PROSTATE BIOPSY  08/14/11   Adenocarcinoma/volume=58.51cc,gleason=3+3=6 & 3+4=7   TONSILLECTOMY     78 years old     Medications Prior to Admission: Prior to Admission medications   Medication Sig Start Date End Date Taking? Authorizing Provider  acetaminophen  (TYLENOL ) 500 MG tablet Take 1,000-1,500 mg by mouth daily as needed for moderate pain or headache.    [provider]  allopurinol  (ZYLOPRIM ) 300 MG tablet Take 150 mg by mouth daily. 04/15/19   [provider]  aspirin  EC 81 MG tablet Take 4 tablets (325 mg total) by mouth daily. Swallow whole. 07/09/22   Patel, Poonamkumari J, MD  buPROPion  (WELLBUTRIN  XL) 300 MG 24 hr tablet Take 300 mg by mouth daily.  05/10/15   [provider]  cetirizine (ZYRTEC) 10 MG tablet Take 10 mg by mouth daily.    [provider]  Cholecalciferol (VITAMIN D3) 10 MCG (400 UNIT) CAPS Take 1 capsule (400 Units total) by mouth  daily. 04/21/22   Schuh, McKenzi P, PA-C  diclofenac  Sodium (VOLTAREN ) 1 % GEL Apply 2 g topically 4 (four) times daily. 11/06/20   White, Shelba SAUNDERS, NP  donepezil  (ARICEPT ) 5 MG tablet Take 1 tablet (5 mg total) by mouth at bedtime. Patient not taking: Reported on 07/06/2022 08/13/20   Dohmeier, Dedra, MD  ferrous sulfate 325 (65 FE) MG tablet Take 325 mg by mouth daily with breakfast. Patient not taking: Reported on 07/06/2022    [provider]  furosemide  (LASIX ) 40 MG tablet Take 40 mg by mouth daily.    [provider]  lidocaine  (XYLOCAINE ) 5 % ointment Apply 1 application topically as needed. 11/06/20   Teresa Shelba SAUNDERS, NP  Melatonin 10 MG TABS Take 10 mg by mouth at bedtime.    [provider]  Nebivolol  HCl 20 MG TABS Take 20 mg by mouth at bedtime.    [provider]  oxyCODONE  (ROXICODONE ) 5 MG immediate release tablet Take 1 tablet by mouth every 4 to 6 hours as needed for pain 07/08/22   [place],  Josefa PARAS, PA-C  pantoprazole  (PROTONIX ) 40 MG tablet Take 1 tablet (40 mg total) by mouth daily before supper. Patient not taking: Reported on 07/06/2022 09/18/18   Shona Terry SAILOR, DO  polyethylene glycol powder (GLYCOLAX /MIRALAX ) 17 GM/SCOOP powder Take 17 g by mouth 2 (two) times daily. Until daily soft stools OTC 02/22/22   Vicky Charleston, PA-C  rosuvastatin  (CRESTOR ) 5 MG tablet Take 5 mg by mouth daily. 02/05/21   [provider]  tamsulosin  (FLOMAX ) 0.4 MG CAPS capsule Take 1 capsule by mouth every day 05/05/23   Sherrilee Belvie CROME, MD  venlafaxine  XR (EFFEXOR -XR) 75 MG 24 hr capsule Take 75 mg by mouth daily. 07/05/21   [provider]     Allergies:    Allergies  Allergen Reactions   Lisinopril Cough    Social History:   Social History   Socioeconomic History   Marital status: Married    Spouse name: Orlean   Number of children: 3   Years of education: 12   Highest education level: Not on file  Occupational History    Occupation: retired    Associate Professor: [employer]  Tobacco Use   Smoking status: Former    Current packs/day: 0.00    Average packs/day: 1.5 packs/day for 30.0 years (45.0 ttl pk-yrs)    Types: Cigarettes    Start date: 04/14/1970    Quit date: 04/13/2000    Years since quitting: 23.5    Passive exposure: Past   Smokeless tobacco: Never  Vaping Use   Vaping status: Never Used  Substance and Sexual Activity   Alcohol use: Not Currently    Alcohol/week: 1.0 standard drink of alcohol    Types: 1 Cans of beer per week    Comment: a beer about once a week, less than in his youth   Drug use: No   Sexual activity: Not Currently  Other Topics Concern   Not on file  Social History Narrative   Patient is married Nickey) and lives at home with his wife and one child.   Patient has three children.   Patient is retired.   Patient has a high school education.   Patient is ambi-dextrous.   Patient drinks two cups of coffee about three times a week.    Social Drivers of Corporate investment banker Strain: Not on file  Food Insecurity: No Food Insecurity (07/02/2022)   Hunger Vital Sign    Worried About Running Out of Food in the Last Year: Never true    Ran Out of Food in the Last Year: Never true  Transportation Needs: No Transportation Needs (07/02/2022)   PRAPARE - Administrator, Civil Service (Medical): No    Lack of Transportation (Non-Medical): No  Physical Activity: Not on file  Stress: Not on file  Social Connections: Not on file  Intimate Partner Violence: Not At Risk (07/02/2022)   Humiliation, Afraid, Rape, and Kick questionnaire    Fear of Current or Ex-Partner: No    Emotionally Abused: No    Physically Abused: No    Sexually Abused: No    Family History:   The patient's family history includes Alcohol abuse in his father; Alcohol abuse (age of onset: 4) in his brother; Cancer in his mother and paternal aunt. There is no history of Colon cancer.    Review of  Systems: [y] = yes, [ ]  = no    General: Weight gain [ ] ; Weight loss [ ] ; Anorexia [ y];  Fatigue [ ] ; Fever [ ] ; Chills [ ] ; Weakness [ ]   Cardiac: Chest pain/pressure [ ] ; Resting SOB [ ] ; Exertional SOB [ ] ; Orthopnea [ ] ; Pedal Edema [ ] ; Palpitations [ ] ; Syncope [ ] ; Presyncope [ ] ; Paroxysmal nocturnal dyspnea[ ]   Pulmonary: Cough [ ] ; Wheezing[ ] ; Hemoptysis[ ] ; Sputum [ ] ; Snoring [ ]   GI: Vomiting[ ] ; Dysphagia[ ] ; Melena[ ] ; Hematochezia [ ] ; Heartburn[ ] ; Abdominal pain [ y]; Constipation [ ] ; Diarrhea [ y]; BRBPR [ ]   GU: Hematuria[ ] ; Dysuria [ ] ; Nocturia[ ]   Vascular: Pain in legs with walking [ ] ; Pain in feet with lying flat [ ] ; Non-healing sores [ ] ; Stroke [ ] ; TIA [ ] ; Slurred speech [ ] ;  Neuro: Headaches[ ] ; Vertigo[ ] ; Seizures[ ] ; Paresthesias[ ] ;Blurred vision [ ] ; Diplopia [ ] ; Vision changes [ ]   Ortho/Skin: Arthritis [ ] ; Joint pain [ ] ; Muscle pain [ ] ; Joint swelling [ ] ; Back Pain [ ] ; Rash [ ]   Psych: Depression[ ] ; Anxiety[ ]   Heme: Bleeding problems [ ] ; Clotting disorders [ ] ; Anemia [ ]   Endocrine: Diabetes [ ] ; Thyroid  dysfunction[ ]   Physical Exam/Data:   Vitals:   10/26/23 1749 10/26/23 2000 10/26/23 2030 10/26/23 2045  BP: (!) 141/89 (!) 130/92 120/89 (!) 126/96  Pulse: (!) 123 (!) 118 (!) 122 (!) 121  Resp: 18     Temp: 98.1 F (36.7 C)     TempSrc: Oral     SpO2: 100% 98% 96% 94%  Weight:      Height:       No intake or output data in the 24 hours ending 10/26/23 2131 Filed Weights   10/26/23 1747  Weight: 81.6 kg   Body mass index is 23.75 kg/m.  General: Temporal wasting present HEENT: normal Neck: no JVD Cardiac:  normal S1, S2; murmur present Lungs:  clear to auscultation bilaterally Abd: Mild tenderness around his umbilical area Ext: no edema Musculoskeletal:  No deformities Skin: warm and dry  Psych:  Normal affect    EKG:  EKG obtained 10/26/2023 at 17:51-  Laboratory Data:  Component     Latest Ref Rng 10/26/2023   WBC     4.0 - 10.5 K/uL 5.7   RBC     4.22 - 5.81 MIL/uL 3.41 (L)   Hemoglobin     13.0 - 17.0 g/dL 89.2 (L)   HCT     60.9 - 52.0 % 33.5 (L)   MCV     80.0 - 100.0 fL 98.2   MCH     26.0 - 34.0 pg 31.4   MCHC     30.0 - 36.0 g/dL 68.0   RDW     88.4 - 84.4 % 16.0 (H)   Platelets     150 - 400 K/uL 156   nRBC     0.0 - 0.2 % 0.0   Neutrophils     % 79   NEUT#     1.7 - 7.7 K/uL 4.5   Lymphocytes     % 8   Lymphs Abs     0.7 - 4.0 K/uL 0.4 (L)   Monocytes Relative     % 9   Monocyte #     0.1 - 1.0 K/uL 0.5   Eosinophil     % 4   Eosinophils Absolute     0.0 - 0.5 K/uL 0.2   Basophil     % 0   Basophils Absolute  0.0 - 0.1 K/uL 0.0   Immature Granulocytes     % 0   Abs Immature Granulocytes     0.00 - 0.07 K/uL 0.02     Component     Latest Ref Rng 10/26/2023  Sodium     135 - 145 mmol/L 135   Potassium     3.5 - 5.1 mmol/L 4.4   Chloride     98 - 111 mmol/L 104   CO2     22 - 32 mmol/L 20 (L)   Glucose     70 - 99 mg/dL 64 (L)   BUN     8 - 23 mg/dL 16   Creatinine     9.38 - 1.24 mg/dL 6.22 (H)   Calcium      8.9 - 10.3 mg/dL 8.2 (L)   Total Protein     6.5 - 8.1 g/dL 6.5   Albumin      3.5 - 5.0 g/dL 3.0 (L)   AST     15 - 41 U/L 92 (H)   ALT     0 - 44 U/L 69 (H)   Alkaline Phosphatase     38 - 126 U/L 169 (H)   Total Bilirubin     0.0 - 1.2 mg/dL 2.4 (H)   GFR, Estimated     >60 mL/min 16 (L)   Anion gap     5 - 15  11      Radiology/Studies:  US  Abdomen Limited RUQ (LIVER/GB) Result Date: 10/26/2023 EXAM: Right Upper Quadrant Abdominal Ultrasound TECHNIQUE: Real-time ultrasonography of the right upper quadrant of the abdomen was performed. COMPARISON: None. CLINICAL HISTORY: Cholelithiasis. FINDINGS: LIVER: The liver demonstrates hyperechoic hepatic parenchyma, suggesting hepatic steatosis. No intrahepatic biliary ductal dilatation. No mass. BILIARY SYSTEM: Layering small gallstones, measuring up to 9 mm. No gallbladder wall  thickening and/or pericholecystic fluid or sonographic murphy sign. Common bile duct measures 4 mm. OTHER: Right upper pole renal cyst, better evaluated on CT. No right upper quadrant ascites. IMPRESSION: 1. Cholelithiasis, without sonographic evidence of acute cholecystitis. 2. Hepatic steatosis. Electronically signed by: Pinkie Pebbles MD 10/26/2023 09:20 PM EDT RP Workstation: [id number]   CT ABDOMEN PELVIS W CONTRAST Result Date: 10/26/2023 EXAM: CT ABDOMEN AND PELVIS WITH CONTRAST 10/26/2023 07:30:25 PM TECHNIQUE: CT of the abdomen and pelvis was performed with the administration of 70 mL of iohexol (OMNIPAQUE) 350 MG/ML injection. Multiplanar reformatted images are provided for review. Automated exposure control, iterative reconstruction, and/or weight-based adjustment of the mA/kV was utilized to reduce the radiation dose to as low as reasonably achievable. COMPARISON: CT abdomen and pelvis dated 02/22/2010. CLINICAL HISTORY: Epigastric pain. Patient on dialysis. FINDINGS: LOWER CHEST: No acute abnormality. LIVER: The liver is unremarkable. GALLBLADDER AND BILE DUCTS: Gallstones are present. No biliary ductal dilatation. SPLEEN: No acute abnormality. PANCREAS: No acute abnormality. ADRENAL GLANDS: No acute abnormality. KIDNEYS, URETERS AND BLADDER: Bilateral renal cysts are again seen. The largest cyst is in the superior pole of the right kidney measuring 6.4 cm. Partially calcified low attenuation area in the right kidney measures 14 mm similar to prior. There is mild nonspecific bilateral perinephric fat stranding. There is no hydronephrosis. No stones in the kidneys or ureters. Urinary bladder is unremarkable. Per consensus, no follow-up is needed for simple Bosniak type 1 and 2 renal cysts, unless the patient has a malignancy history or risk factors. GI AND BOWEL: Stomach demonstrates no acute abnormality. There are postsurgical changes in the distal stomach. The appendix is visualized  and appears  normal. There is descending colon diverticulosis. There is no bowel obstruction. PERITONEUM AND RETROPERITONEUM: No ascites. No free air. VASCULATURE: There are atherosclerotic calcifications of the aorta. Abdominal aortic aneurysm measures 3 cm, unchanged. LYMPH NODES: No lymphadenopathy. REPRODUCTIVE ORGANS: No acute abnormality. BONES AND SOFT TISSUES: Degenerative changes affect the lower lumbar spine. Right hip screw present. There is a small fat-containing left inguinal hernia. No acute osseous abnormality. No focal soft tissue abnormality. IMPRESSION: 1. No acute abdominal or pelvic process identified 2. Cholelithiasis 3. Bilateral renal cysts 4. Partially calcified right renal lesion, stable 5. Descending colonic diverticulosis 6. Abdominal aortic aneurysm measuring 3 cm, unchanged Recommend surveillance ultrasound in 3 years. Electronically signed by: Greig Pique MD 10/26/2023 08:05 PM EDT RP Workstation: [id number]   DG Chest 2 View Result Date: 10/26/2023 CLINICAL DATA:  New atrial fibrillation. EXAM: CHEST - 2 VIEW COMPARISON:  Radiographs 07/02/2022 and 04/21/2022.  CT 12/23/2021. FINDINGS: Left subclavian Port-A-Cath extends to the mid SVC level. The heart size and mediastinal contours are stable. The lungs remain clear. No pleural effusion or pneumothorax. There are surgical clips in the right axilla. The bones appear unremarkable. IMPRESSION: Stable chest. No evidence of acute cardiopulmonary process. Electronically Signed   By: Elsie Perone M.D.   On: 10/26/2023 18:53    Assessment and Plan:  Mr. Gerst presented to the urgent care with epigastric tenderness, loose stools, and found to be in what looks to be an atrial tachycardia on telemetry with a rapid ventricular response. His EKG does not show the flutter waves. However on the telemetry there are 2 clear P waves for every QRS best seen in the precordial leads, which I believe is atrial tachycardia however could be atrial flutter  as well. Would recommend rate control at this current time and identification of his current GI illness which may be the trigger.  My overnight interpretation of his arrhythmia is atrial tachycardia however, cannot rule out atrial flutter. If he is in flutter he would require anticoagulation. However, he is notably anemic with a hemoglobin of 10.7. Mr. Olden has a history of acute blood loss anemia in the setting of a bleeding gastric ulcer found to be gastric adenocarcinoma in 2017 with a nadir hemoglobin of 5. Given this history, his presenting symptoms of abdominal pain and loss of appetite, no recent hemoglobin to compare his admission labs to, I would recommend getting 2 additional hemoglobin levels in the next few hours to identify if he would be a candidate for heparin  if required.  - ECHO in the AM  - Please evaluate etiology of anemia.  - Begin metoprolol  tartrate 12.5 mg   Signed, Jeriah Corkum C Paislea Hatton, MD  10/26/2023 9:31 PM   Merlene Blood, MD MS  Cardiology [facility]  =================  Notes from June 2024 state that home verapamil  was discontinued, but Mr. Mcloud confirmed to Dr. Gifford that he is currently on the medication. Continue verapamil  40 mg TID in the AM. Discontinue metoprolol  tartrate. Can consider increased dose to 80 mg if blood pressures remain elevated, and ultimately transition to once daily CR dosing.

## 2023-10-26 NOTE — ED Provider Notes (Signed)
 [facility] AT [facility] Provider Note   CSN: [id number] Arrival date & time: 10/26/23  1739     Patient presents with: Palpitations   Jacob [name] is a 78 y.o. male.   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 intake since Saturday, 10/11, without accompanying vomiting, fevers, or chills.  Patient denies any headaches, vision changes, chest pain, shortness of breath, or syncope, however endorses palpitations since this time as well.  He also endorses 2-4 X episodes of loose stool without black tarry appearance or gross blood since 10/11.  Notably, patient presented to UC earlier today who appreciated patient to have atrial flutter, and advised him to go to the ED.  Patient denies a history of atrial fibrillation/flutter and is not anticoagulated.        Prior to Admission medications   Medication Sig Start Date End Date Taking? Authorizing Provider  acetaminophen  (TYLENOL ) 500 MG tablet Take 1,000-1,500 mg by mouth daily as needed for moderate pain or headache.    [provider]  allopurinol  (ZYLOPRIM ) 300 MG tablet Take 150 mg by mouth daily. 04/15/19   [provider]  aspirin  EC 81 MG tablet Take 4 tablets (325 mg total) by mouth daily. Swallow whole. 07/09/22   Patel, Poonamkumari J, MD  buPROPion  (WELLBUTRIN  XL) 300 MG 24 hr tablet Take 300 mg by mouth daily.  05/10/15   [provider]  cetirizine (ZYRTEC) 10 MG tablet Take 10 mg by mouth daily.    [provider]  Cholecalciferol (VITAMIN D3) 10 MCG (400 UNIT) CAPS Take 1 capsule (400 Units total) by mouth daily. 04/21/22   Schuh, McKenzi P, PA-C  diclofenac  Sodium (VOLTAREN ) 1 % GEL Apply 2 g topically 4 (four) times daily. 11/06/20   White, Shelba SAUNDERS, NP   donepezil  (ARICEPT ) 5 MG tablet Take 1 tablet (5 mg total) by mouth at bedtime. Patient not taking: Reported on 07/06/2022 08/13/20   Dohmeier, Dedra, MD  ferrous sulfate 325 (65 FE) MG tablet Take 325 mg by mouth daily with breakfast. Patient not taking: Reported on 07/06/2022    [provider]  furosemide  (LASIX ) 40 MG tablet Take 40 mg by mouth daily.    [provider]  lidocaine  (XYLOCAINE ) 5 % ointment Apply 1 application topically as needed. 11/06/20   Teresa Shelba SAUNDERS, NP  Melatonin 10 MG TABS Take 10 mg by mouth at bedtime.    [provider]  Nebivolol  HCl 20 MG TABS Take 20 mg by mouth at bedtime.    [provider]  oxyCODONE  (ROXICODONE ) 5 MG immediate release tablet Take 1 tablet by mouth every 4 to 6 hours as needed for pain 07/08/22   [place], Josefa PARAS, PA-C  pantoprazole  (PROTONIX ) 40 MG tablet Take 1 tablet (40 mg total) by mouth daily before supper. Patient not taking: Reported on 07/06/2022 09/18/18   Shona Terry SAILOR, DO  polyethylene glycol powder (GLYCOLAX /MIRALAX ) 17 GM/SCOOP powder Take 17 g by mouth 2 (two) times daily. Until daily soft stools OTC 02/22/22   Vicky Charleston, PA-C  rosuvastatin  (CRESTOR ) 5 MG tablet Take 5 mg by mouth daily. 02/05/21   [provider]  tamsulosin  (FLOMAX ) 0.4 MG CAPS capsule Take 1 capsule by mouth every day 05/05/23   Sherrilee Belvie CROME,  MD  venlafaxine  XR (EFFEXOR -XR) 75 MG 24 hr capsule Take 75 mg by mouth daily. 07/05/21   [provider]    Allergies: Lisinopril    Review of Systems  Updated Vital Signs BP (!) 126/96   Pulse (!) 121   Temp 98.1 F (36.7 C) (Oral)   Resp 18   Ht 6' 1 (1.854 m)   Wt 81.6 kg   SpO2 94%   BMI 23.75 kg/m   Physical Exam Constitutional:      Appearance: Normal appearance.  HENT:     Head: Normocephalic.     Nose: Nose normal.     Mouth/Throat:     Mouth: Mucous membranes are dry.  Eyes:     Extraocular Movements: Extraocular movements  intact.     Pupils: Pupils are equal, round, and reactive to light.  Cardiovascular:     Rate and Rhythm: Tachycardia present. Rhythm irregular.     Pulses: Normal pulses.     Heart sounds: Normal heart sounds.  Abdominal:     General: Abdomen is flat.     Tenderness: There is abdominal tenderness. There is no guarding or rebound.     Comments: Epigastric tenderness to palpation  Musculoskeletal:     Cervical back: Normal range of motion.  Skin:    General: Skin is warm and dry.     Capillary Refill: Capillary refill takes 2 to 3 seconds.  Neurological:     Mental Status: He is alert.     (all labs ordered are listed, but only abnormal results are displayed) Labs Reviewed  CBC WITH DIFFERENTIAL/PLATELET - Abnormal; Notable for the following components:      Result Value   RBC 3.41 (*)    Hemoglobin 10.7 (*)    HCT 33.5 (*)    RDW 16.0 (*)    Lymphs Abs 0.4 (*)    All other components within normal limits  COMPREHENSIVE METABOLIC PANEL WITH GFR - Abnormal; Notable for the following components:   CO2 20 (*)    Glucose, Bld 64 (*)    Creatinine, Ser 3.77 (*)    Calcium  8.2 (*)    Albumin  3.0 (*)    AST 92 (*)    ALT 69 (*)    Alkaline Phosphatase 169 (*)    Total Bilirubin 2.4 (*)    GFR, Estimated 16 (*)    All other components within normal limits  BRAIN NATRIURETIC PEPTIDE - Abnormal; Notable for the following components:   B Natriuretic Peptide 122.1 (*)    All other components within normal limits  TROPONIN I (HIGH SENSITIVITY) - Abnormal; Notable for the following components:   Troponin I (High Sensitivity) 26 (*)    All other components within normal limits  TROPONIN I (HIGH SENSITIVITY) - Abnormal; Notable for the following components:   Troponin I (High Sensitivity) 24 (*)    All other components within normal limits  MAGNESIUM   LIPASE, BLOOD  TSH    EKG: EKG Interpretation Date/Time:  Monday October 26 2023 17:51:40 EDT Ventricular Rate:  119 PR  Interval:    QRS Duration:  86 QT Interval:  333 QTC Calculation: 469 R Axis:   53  Text Interpretation: Atrial flutter with predominant 2:1 AV block Abnormal R-wave progression, early transition Borderline ST depression, inferior leads when compared to prior, now aflutter No STEMI Confirmed by Ginger Barefoot ([id number]) on 10/26/2023 6:20:51 PM  Radiology: CT ABDOMEN PELVIS W CONTRAST Result Date: 10/26/2023 EXAM: CT ABDOMEN AND PELVIS WITH  CONTRAST 10/26/2023 07:30:25 PM TECHNIQUE: CT of the abdomen and pelvis was performed with the administration of 70 mL of iohexol (OMNIPAQUE) 350 MG/ML injection. Multiplanar reformatted images are provided for review. Automated exposure control, iterative reconstruction, and/or weight-based adjustment of the mA/kV was utilized to reduce the radiation dose to as low as reasonably achievable. COMPARISON: CT abdomen and pelvis dated 02/22/2010. CLINICAL HISTORY: Epigastric pain. Patient on dialysis. FINDINGS: LOWER CHEST: No acute abnormality. LIVER: The liver is unremarkable. GALLBLADDER AND BILE DUCTS: Gallstones are present. No biliary ductal dilatation. SPLEEN: No acute abnormality. PANCREAS: No acute abnormality. ADRENAL GLANDS: No acute abnormality. KIDNEYS, URETERS AND BLADDER: Bilateral renal cysts are again seen. The largest cyst is in the superior pole of the right kidney measuring 6.4 cm. Partially calcified low attenuation area in the right kidney measures 14 mm similar to prior. There is mild nonspecific bilateral perinephric fat stranding. There is no hydronephrosis. No stones in the kidneys or ureters. Urinary bladder is unremarkable. Per consensus, no follow-up is needed for simple Bosniak type 1 and 2 renal cysts, unless the patient has a malignancy history or risk factors. GI AND BOWEL: Stomach demonstrates no acute abnormality. There are postsurgical changes in the distal stomach. The appendix is visualized and appears normal. There is descending colon  diverticulosis. There is no bowel obstruction. PERITONEUM AND RETROPERITONEUM: No ascites. No free air. VASCULATURE: There are atherosclerotic calcifications of the aorta. Abdominal aortic aneurysm measures 3 cm, unchanged. LYMPH NODES: No lymphadenopathy. REPRODUCTIVE ORGANS: No acute abnormality. BONES AND SOFT TISSUES: Degenerative changes affect the lower lumbar spine. Right hip screw present. There is a small fat-containing left inguinal hernia. No acute osseous abnormality. No focal soft tissue abnormality. IMPRESSION: 1. No acute abdominal or pelvic process identified 2. Cholelithiasis 3. Bilateral renal cysts 4. Partially calcified right renal lesion, stable 5. Descending colonic diverticulosis 6. Abdominal aortic aneurysm measuring 3 cm, unchanged Recommend surveillance ultrasound in 3 years. Electronically signed by: Greig Pique MD 10/26/2023 08:05 PM EDT RP Workstation: [id number]   DG Chest 2 View Result Date: 10/26/2023 CLINICAL DATA:  New atrial fibrillation. EXAM: CHEST - 2 VIEW COMPARISON:  Radiographs 07/02/2022 and 04/21/2022.  CT 12/23/2021. FINDINGS: Left subclavian Port-A-Cath extends to the mid SVC level. The heart size and mediastinal contours are stable. The lungs remain clear. No pleural effusion or pneumothorax. There are surgical clips in the right axilla. The bones appear unremarkable. IMPRESSION: Stable chest. No evidence of acute cardiopulmonary process. Electronically Signed   By: Elsie Perone M.D.   On: 10/26/2023 18:53     Procedures   Medications Ordered in the ED  metoprolol  tartrate (LOPRESSOR ) tablet 12.5 mg (has no administration in time range)  iohexol (OMNIPAQUE) 350 MG/ML injection 70 mL (70 mLs Intravenous Contrast Given 10/26/23 1930)    Clinical Course as of 10/26/23 2118  Mon Oct 26, 2023  1931 CTAP [ ]   [WB]  2118 Discussed with admitting team. [WB]    Clinical Course User Index [WB] Machel Violante, Elsie, MD                                 Medical  Decision Making Amount and/or Complexity of Data Reviewed Labs: ordered. Radiology: ordered.  Risk Prescription drug management.  Patient is a 78 year old gentleman with PMH as above presenting to the ED with chief complaint of generalized/epigastric abdominal discomfort and decreased p.o. intake since Saturday, 10/11, found to have atrial flutter at urgent  care, and referred to the ED.  Patient denying any chest pain or shortness of breath, though reports intermittent palpitations since Saturday.  Notably, patient ESRD on HD MWF and reports full dialysis session earlier today.  Upon arrival, patient mildly hypertensive 141/89.  Afebrile.  Patient tachycardic 123 with atrial flutter with 2:1 conduction abnormality.  No tachypnea.  Saturating 100% on RA.  Patient mentating appropriately with GCS 15.  Warm and well-perfused.  Abdominal examination significant for LUQ/epigastric tenderness to palpation without rebound or guarding.  The setting of new onset atrial fibrillation/flutter with abdominal pain with p.o. aversion, mesenteric ischemia considered, however pain/discomfort reproduced upon examination with palpation, and bicarb 20, without evidence of severe lactic acidosis.  CTAP with contrast performed without evidence of acute abdominal or pelvic process identified.  Cholelithiasis noted.  Formal RUQ US  ordered with concern for potentially symptomatic cholelithiasis v early developing cholecystitis.  Notably, patient remains afebrile with no leukocytosis.  LFTs mildly elevated, with R factor 1.2, consistent with cholestatic pattern of injury.  BMP 122 and lipase WNL.  EKG reviewed without evidence of STEMI/OMI.  Troponin 26.  Patient denying any chest pain, shortness of breath, or anginal equivalents at this time.  Doubt ACS.  Cardiology team consulted.  Recommend initiation of metoprolol  to tartrate 12.5 mg.  Discussed with inpatient team, patient to be admitted for new onset atrial flutter in  the setting of symptomatic cholelithiasis.  RUQ ultrasound pending at time of admission to rule out evidence of early developing cholecystitis.  Final diagnoses:  Atrial flutter with rapid ventricular response [facility])    ED Discharge Orders     None          Dorcus Fallow, MD 10/26/23 2118    Tegeler, Lonni PARAS, MD 10/27/23 [phone]

## 2023-10-26 NOTE — ED Triage Notes (Addendum)
 Pt BIB [facility], at dialysis before going to UC. Pt feeling bad for a couple days. Abd pain, palpations, and lightheadedness. Completed dialysis today and still felt bad, went to UC. No hx of a fib, per EMS in a fib and a flutter. 110-150 rate. Denies n/v but lose of appetite.   3 81 mg, 243 of aspirin  given en route.  VS 134/80

## 2023-10-26 NOTE — H&P (Signed)
 History and Physical    Jacob [name] FMW:[id number] DOB: [birth year] DOA: 10/26/2023  Patient coming from: Home.  Chief Complaint: Abdominal pain.  Palpitations.  HPI: Jacob [name] is a 78 y.o. 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  Patient also has been having some watery diarrhea.  Denies any recent antibiotic use travel or sick contacts.  Has had at least 7 episodes of watery diarrhea in the last 3 days.  At the same time patient also had sensation of palpitations off and on.  Has been taking his verapamil .  Denies chest pain shortness of breath.  Has had dialysis today.  Following the dialysis he went to urgent care and was referred to the ER.  ED Course: In the ER patient's labs show elevated AST ALT and total bilirubin of 2.4 alkaline phos of 169.  Lipase was 43.  CT abdomen pelvis shows no acute abdominal pelvic process but did show gallstones.  Ultrasound of the abdomen does show gallstone but no cholecystitis features.  In the ER patient was tachycardic with a heart rate in the 120s initially concerning for atrial flutter.  Cardiology was consulted and at this time cardiology feels rhythm is likely atrial tachycardia was given 1 dose of p.o. metoprolol .  Patient also was hypoglycemic and was started on D10 after 2 doses of D50 did not improve the glucose levels.  Review of Systems: As per HPI, rest all negative.   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, has had transfusion   Anxiety and depression    Arthritis    gout   Back pain    BPH with obstruction/lower urinary tract symptoms    Chronic kidney disease    Stage 5 kidney    Constipation    ED (erectile dysfunction)    GERD (gastroesophageal reflux disease)    Hypercholesterolemia    Hypertension    Night sweats    Post-operative nausea and vomiting 04/09/2016   Prostate cancer (HCC) 08/14/2011   Adenocarcinoma,gleason:3+3=6,& 3+4=7,PSA=5.66, s/p radiation therapy   PUD (peptic ulcer disease)    Sleep apnea    does not use Cpap but occasionally uses O2 @ 2L qhs   Stomach cancer (HCC)    Ulcer    peptic ulcer hx    Past Surgical History:  Procedure Laterality Date   AV FISTULA PLACEMENT Right 02/19/2021   Procedure: ARTERIOVENOUS (AV) FISTULA CREATION;  Surgeon: Lanis Fonda BRAVO, MD;  Location: [facility] OR;  Service: Vascular;  Laterality: Right;   AV FISTULA PLACEMENT Right 04/21/2022   Procedure: INSERTION OF RIGHT ARM ARTERIOVENOUS (AV) GORE-TEX GRAFT;  Surgeon: Lanis Fonda BRAVO, MD;  Location: [facility] OR;  Service: Vascular;  Laterality: Right;   BASCILIC VEIN TRANSPOSITION Right 07/23/2021   Procedure: RIGHT ARM BASILIC VEIN TRANSPOSITION;  Surgeon: Lanis Fonda BRAVO, MD;  Location: [facility] OR;  Service: Vascular;  Laterality: Right;  PERIPHERAL NERVE BLOCK   colon polyps bx  11/24/06   colon,transverse and rectosigmoid polyps:tubular adenomas and hyperplastic polyps,no high grade dysplasia or malignancy    COLONOSCOPY WITH PROPOFOL  N/A 08/24/2019   Procedure: COLONOSCOPY WITH PROPOFOL ;  Surgeon: San Sandor GAILS, DO;  Location: [facility];  Service:  Gastroenterology;  Laterality: N/A;   duodenal bx  11/24/06   benign   ESOPHAGOGASTRODUODENOSCOPY N/A 10/27/2015   Procedure: ESOPHAGOGASTRODUODENOSCOPY (EGD);  Surgeon: Lupita FORBES Commander, MD;  Location: [facility];  Service: Endoscopy;  Laterality: N/A;   EUS N/A 11/08/2015   Procedure: UPPER ENDOSCOPIC ULTRASOUND (EUS) RADIAL;  Surgeon: Toribio SHAUNNA Cedar, MD;  Location: [facility];  Service: Endoscopy;  Laterality: N/A;   GASTRECTOMY N/A 03/25/2016   Procedure: DISTAL GASTRECTOMY;  Surgeon: Jina Nephew, MD;  Location:  [facility] OR;  Service: General;  Laterality: N/A;   gastric bx  11/24/06   chronic active gastritis,with metaplasia and focal changaes of xanthelasma   GASTROSTOMY N/A 03/25/2016   Procedure: INSERTION OF FEEDING TUBE;  Surgeon: Jina Nephew, MD;  Location: [facility] OR;  Service: General;  Laterality: N/A;   INSERTION OF DIALYSIS CATHETER Left 04/21/2022   Procedure: INSERTION OF TUNNELED DIALYSIS CATHETER, LEFT INTERNAL JUGULAR;  Surgeon: Lanis Fonda FORBES, MD;  Location: [facility] OR;  Service: Vascular;  Laterality: Left;   INSERTION PROSTATE RADIATION SEED  12-29-11   INTRAMEDULLARY (IM) NAIL INTERTROCHANTERIC Right 07/03/2022   Procedure: OPEN TREATMENT RIGHT HIP FRACTURE;  Surgeon: Elsa Lonni SAUNDERS, MD;  Location: [facility] OR;  Service: Orthopedics;  Laterality: Right;   IR GENERIC HISTORICAL  04/01/2016   IR GJ TUBE CHANGE 04/01/2016 Juliene Balder, MD [facility]   IR GENERIC HISTORICAL  04/05/2016   IR GJ TUBE CHANGE 04/05/2016 Juliene Balder, MD [facility]   LAPAROSCOPIC GASTRECTOMY  03/25/2016   Diagnostic laparoscopy, distal gastrectomy with Billroth 2 reconstruction and gastrojejunostomy tube   LAPAROSCOPY N/A 03/25/2016   Procedure: DIAGNOSTIC LAPAROSCOPY;  Surgeon: Jina Nephew, MD;  Location: [facility] OR;  Service: General;  Laterality: N/A;   POLYPECTOMY  08/24/2019   Procedure: POLYPECTOMY;  Surgeon: San Sandor GAILS, DO;  Location: [facility];  Service: Gastroenterology;;   PORTACATH PLACEMENT Left 12/04/2015   Procedure: INSERTION PORT-A-CATH;  Surgeon: Jina Nephew, MD;  Location: [facility] OR;  Service: General;  Laterality: Left;   PROSTATE BIOPSY  08/14/11   Adenocarcinoma/volume=58.51cc,gleason=3+3=6 & 3+4=7   TONSILLECTOMY     78 years old     reports that he quit smoking about 23 years ago. His smoking use included cigarettes. He started smoking about 53 years ago. He has a 45 pack-year smoking history. He has been exposed to tobacco smoke. He has never used smokeless tobacco. He reports that he does not  currently use alcohol after a past usage of about 1.0 standard drink of alcohol per week. He reports that he does not use drugs.  Allergies  Allergen Reactions   Lisinopril Cough    Family History  Problem Relation Age of Onset   Cancer Mother        NOS   Alcohol abuse Father    Alcohol abuse Brother 39   Cancer Paternal Aunt        NOS   Colon cancer Neg Hx     Prior to Admission medications   Medication Sig Start Date End Date Taking? Authorizing Provider  acetaminophen  (TYLENOL ) 500 MG tablet Take 1,000-1,500 mg by mouth daily as needed for moderate pain or headache.   Yes [provider]  allopurinol  (ZYLOPRIM ) 300 MG tablet Take 150 mg by mouth daily. 04/15/19  Yes [provider]  aspirin  EC 81 MG tablet Take 4 tablets (325 mg total) by mouth daily. Swallow whole. Patient taking differently: Take 81 mg by mouth daily. Swallow whole. 07/09/22  Yes Tobie Lola PARAS, MD  buPROPion  (WELLBUTRIN  XL) 300 MG 24 hr tablet Take 300 mg by mouth daily.  05/10/15  Yes [provider]  cetirizine (ZYRTEC) 10 MG tablet Take 10 mg by mouth daily.   Yes [provider]  Cholecalciferol (VITAMIN D3) 10 MCG (400 UNIT) CAPS Take 1 capsule (400 Units total) by mouth daily. 04/21/22  Yes Schuh, McKenzi P, PA-C  furosemide  (LASIX ) 40 MG tablet Take 40 mg by mouth daily.   Yes [provider]  Melatonin 10 MG TABS Take 10 mg by mouth at bedtime.   Yes [provider]  polyethylene glycol powder (GLYCOLAX /MIRALAX ) 17 GM/SCOOP powder Take 17 g by mouth 2 (two) times daily. Until daily soft stools OTC 02/22/22  Yes Vicky Charleston, PA-C  rosuvastatin  (CRESTOR ) 5 MG tablet Take 5 mg by mouth daily. 02/05/21  Yes [provider]  sevelamer  carbonate (RENVELA ) 800 MG tablet Take 800 mg by mouth 3 (three) times daily with meals. 10/20/22  Yes [provider]  tamsulosin  (FLOMAX ) 0.4 MG CAPS capsule Take 1 capsule by mouth every day 05/05/23   Yes McKenzie, Belvie CROME, MD  venlafaxine  XR (EFFEXOR -XR) 75 MG 24 hr capsule Take 75 mg by mouth daily. 07/05/21  Yes [provider]  verapamil  (CALAN ) 40 MG tablet Take 40 mg by mouth 3 (three) times daily. 02/11/23  Yes [provider]  Nebivolol  HCl 20 MG TABS Take 20 mg by mouth at bedtime.    [provider]    Physical Exam: Constitutional: Moderately built and nourished. Vitals:   10/26/23 2030 10/26/23 2045 10/26/23 2145 10/26/23 2327  BP: 120/89 (!) 126/96 (!) 125/91   Pulse: (!) 122 (!) 121 95   Resp:      Temp:    98.8 F (37.1 C)  TempSrc:    Oral  SpO2: 96% 94%    Weight:      Height:       Eyes: Anicteric no pallor. ENMT: No discharge from the ears eyes nose or mouth. Neck: No mass felt.  No neck rigidity. Respiratory: No rhonchi or crepitations. Cardiovascular: S1-S2 heard. Abdomen: Soft nontender bowel sound present. Musculoskeletal: No edema. Skin: No rash. Neurologic: Alert awake oriented time place and person.  Moves all extremities. Psychiatric: Appears normal.  Normal affect.   Labs on Admission: I have personally reviewed following labs and imaging studies  CBC: Recent Labs  Lab 10/26/23 1749 10/26/23 2220  WBC 5.7  --   NEUTROABS 4.5  --   HGB 10.7* 10.5*  HCT 33.5* 32.5*  MCV 98.2  --   PLT 156  --    Basic Metabolic Panel: Recent Labs  Lab 10/26/23 1749  NA 135  K 4.4  CL 104  CO2 20*  GLUCOSE 64*  BUN 16  CREATININE 3.77*  CALCIUM  8.2*  MG 2.1   GFR: Estimated Creatinine Clearance: 18.5 mL/min (A) (by C-G formula based on SCr of 3.77 mg/dL (H)). Liver Function Tests: Recent Labs  Lab 10/26/23 1749  AST 92*  ALT 69*  ALKPHOS 169*  BILITOT 2.4*  PROT 6.5  ALBUMIN  3.0*   Recent Labs  Lab 10/26/23 1749  LIPASE 43   No results for input(s): AMMONIA in the last 168 hours. Coagulation Profile: No results for input(s): INR, PROTIME in the last 168 hours. Cardiac Enzymes: No results for  input(s): CKTOTAL, CKMB, CKMBINDEX, TROPONINI in the last 168 hours. BNP (last 3 results) No results for input(s): PROBNP in the last 8760 hours. HbA1C: No results  for input(s): HGBA1C in the last 72 hours. CBG: Recent Labs  Lab 10/26/23 2303  GLUCAP 47*   Lipid Profile: No results for input(s): CHOL, HDL, LDLCALC, TRIG, CHOLHDL, LDLDIRECT in the last 72 hours. Thyroid  Function Tests: Recent Labs    10/26/23 1758  TSH 1.366   Anemia Panel: No results for input(s): VITAMINB12, FOLATE, FERRITIN, TIBC, IRON, RETICCTPCT in the last 72 hours. Urine analysis:    Component Value Date/Time   COLORURINE YELLOW 02/16/2021 2318   APPEARANCEUR CLEAR 02/16/2021 2318   LABSPEC 1.010 02/16/2021 2318   PHURINE 6.5 02/16/2021 2318   GLUCOSEU NEGATIVE 02/16/2021 2318   HGBUR NEGATIVE 02/16/2021 2318   BILIRUBINUR NEGATIVE 02/16/2021 2318   KETONESUR NEGATIVE 02/16/2021 2318   PROTEINUR NEGATIVE 02/16/2021 2318   NITRITE NEGATIVE 02/16/2021 2318   LEUKOCYTESUR NEGATIVE 02/16/2021 2318   Sepsis Labs: @LABRCNTIP (procalcitonin:4,lacticidven:4) )No results found for this or any previous visit (from the past 240 hours).   Radiological Exams on Admission: US  Abdomen Limited RUQ (LIVER/GB) Result Date: 10/26/2023 EXAM: Right Upper Quadrant Abdominal Ultrasound TECHNIQUE: Real-time ultrasonography of the right upper quadrant of the abdomen was performed. COMPARISON: None. CLINICAL HISTORY: Cholelithiasis. FINDINGS: LIVER: The liver demonstrates hyperechoic hepatic parenchyma, suggesting hepatic steatosis. No intrahepatic biliary ductal dilatation. No mass. BILIARY SYSTEM: Layering small gallstones, measuring up to 9 mm. No gallbladder wall thickening and/or pericholecystic fluid or sonographic murphy sign. Common bile duct measures 4 mm. OTHER: Right upper pole renal cyst, better evaluated on CT. No right upper quadrant ascites. IMPRESSION: 1. Cholelithiasis,  without sonographic evidence of acute cholecystitis. 2. Hepatic steatosis. Electronically signed by: Pinkie Pebbles MD 10/26/2023 09:20 PM EDT RP Workstation: [id number]   CT ABDOMEN PELVIS W CONTRAST Result Date: 10/26/2023 EXAM: CT ABDOMEN AND PELVIS WITH CONTRAST 10/26/2023 07:30:25 PM TECHNIQUE: CT of the abdomen and pelvis was performed with the administration of 70 mL of iohexol (OMNIPAQUE) 350 MG/ML injection. Multiplanar reformatted images are provided for review. Automated exposure control, iterative reconstruction, and/or weight-based adjustment of the mA/kV was utilized to reduce the radiation dose to as low as reasonably achievable. COMPARISON: CT abdomen and pelvis dated 02/22/2010. CLINICAL HISTORY: Epigastric pain. Patient on dialysis. FINDINGS: LOWER CHEST: No acute abnormality. LIVER: The liver is unremarkable. GALLBLADDER AND BILE DUCTS: Gallstones are present. No biliary ductal dilatation. SPLEEN: No acute abnormality. PANCREAS: No acute abnormality. ADRENAL GLANDS: No acute abnormality. KIDNEYS, URETERS AND BLADDER: Bilateral renal cysts are again seen. The largest cyst is in the superior pole of the right kidney measuring 6.4 cm. Partially calcified low attenuation area in the right kidney measures 14 mm similar to prior. There is mild nonspecific bilateral perinephric fat stranding. There is no hydronephrosis. No stones in the kidneys or ureters. Urinary bladder is unremarkable. Per consensus, no follow-up is needed for simple Bosniak type 1 and 2 renal cysts, unless the patient has a malignancy history or risk factors. GI AND BOWEL: Stomach demonstrates no acute abnormality. There are postsurgical changes in the distal stomach. The appendix is visualized and appears normal. There is descending colon diverticulosis. There is no bowel obstruction. PERITONEUM AND RETROPERITONEUM: No ascites. No free air. VASCULATURE: There are atherosclerotic calcifications of the aorta. Abdominal aortic  aneurysm measures 3 cm, unchanged. LYMPH NODES: No lymphadenopathy. REPRODUCTIVE ORGANS: No acute abnormality. BONES AND SOFT TISSUES: Degenerative changes affect the lower lumbar spine. Right hip screw present. There is a small fat-containing left inguinal hernia. No acute osseous abnormality. No focal soft tissue abnormality. IMPRESSION: 1. No acute abdominal or pelvic  process identified 2. Cholelithiasis 3. Bilateral renal cysts 4. Partially calcified right renal lesion, stable 5. Descending colonic diverticulosis 6. Abdominal aortic aneurysm measuring 3 cm, unchanged Recommend surveillance ultrasound in 3 years. Electronically signed by: Greig Pique MD 10/26/2023 08:05 PM EDT RP Workstation: [id number]   DG Chest 2 View Result Date: 10/26/2023 CLINICAL DATA:  New atrial fibrillation. EXAM: CHEST - 2 VIEW COMPARISON:  Radiographs 07/02/2022 and 04/21/2022.  CT 12/23/2021. FINDINGS: Left subclavian Port-A-Cath extends to the mid SVC level. The heart size and mediastinal contours are stable. The lungs remain clear. No pleural effusion or pneumothorax. There are surgical clips in the right axilla. The bones appear unremarkable. IMPRESSION: Stable chest. No evidence of acute cardiopulmonary process. Electronically Signed   By: Elsie Perone M.D.   On: 10/26/2023 18:53    EKG: Independently reviewed.  Atrial tachycardia.  Assessment/Plan Principal Problem:   Atrial flutter (HCC) Active Problems:   Abdominal pain    Abdominal pain with elevated LFTs and gallstones with prior history of gastric cancer status post distal gastrectomy -     Will get MRCP given the elevated LFTs and abdominal pain.  Consult GI.  Symptoms also concerning for possible biliary colic.  Since patient also has diarrhea we will check stool studies. Atrial tachycardia appreciate cardiology consult.  At this time cardiology recommended continuing verapamil .  TSH is normal. Hypoglycemia likely from poor oral intake.  Patient was  persistently hypoglycemic despite D50, started on D10. ESRD on hemodialysis patient had dialysis today.  Consult nephrology for dialysis. Anemia of chronic disease follow CBC. History of gout on allopurinol . History of depression on Wellbutrin  and venlafaxine . Prior history of prostate cancer and history of MGUS.  Since patient has abdominal pain with gallstones and elevated LFTs will need further workup and more than 2 midnight stay.   DVT prophylaxis: Heparin . Code Status: Full code. Family Communication: Discussed with patient. Disposition Plan: Monitored bed. Consults called: Cardiology.  GI. Admission status: Inpatient.

## 2023-10-27 ENCOUNTER — Encounter (HOSPITAL_COMMUNITY): Payer: Self-pay

## 2023-10-27 ENCOUNTER — Other Ambulatory Visit (HOSPITAL_COMMUNITY): Payer: Self-pay

## 2023-10-27 ENCOUNTER — Encounter (HOSPITAL_COMMUNITY): Payer: Self-pay | Admitting: Internal Medicine

## 2023-10-27 ENCOUNTER — Inpatient Hospital Stay (HOSPITAL_COMMUNITY)

## 2023-10-27 ENCOUNTER — Encounter: Payer: Self-pay | Admitting: Oncology

## 2023-10-27 ENCOUNTER — Telehealth (HOSPITAL_COMMUNITY): Payer: Self-pay | Admitting: Pharmacy Technician

## 2023-10-27 DIAGNOSIS — Z8739 Personal history of other diseases of the musculoskeletal system and connective tissue: Secondary | ICD-10-CM

## 2023-10-27 DIAGNOSIS — K8050 Calculus of bile duct without cholangitis or cholecystitis without obstruction: Secondary | ICD-10-CM

## 2023-10-27 DIAGNOSIS — N184 Chronic kidney disease, stage 4 (severe): Secondary | ICD-10-CM

## 2023-10-27 DIAGNOSIS — I2510 Atherosclerotic heart disease of native coronary artery without angina pectoris: Secondary | ICD-10-CM | POA: Diagnosis not present

## 2023-10-27 DIAGNOSIS — F419 Anxiety disorder, unspecified: Secondary | ICD-10-CM

## 2023-10-27 DIAGNOSIS — E785 Hyperlipidemia, unspecified: Secondary | ICD-10-CM | POA: Diagnosis not present

## 2023-10-27 DIAGNOSIS — R1010 Upper abdominal pain, unspecified: Secondary | ICD-10-CM

## 2023-10-27 DIAGNOSIS — K8070 Calculus of gallbladder and bile duct without cholecystitis without obstruction: Principal | ICD-10-CM

## 2023-10-27 DIAGNOSIS — R0989 Other specified symptoms and signs involving the circulatory and respiratory systems: Secondary | ICD-10-CM

## 2023-10-27 DIAGNOSIS — Z980 Intestinal bypass and anastomosis status: Secondary | ICD-10-CM | POA: Diagnosis not present

## 2023-10-27 DIAGNOSIS — I10 Essential (primary) hypertension: Secondary | ICD-10-CM | POA: Diagnosis not present

## 2023-10-27 DIAGNOSIS — I4892 Unspecified atrial flutter: Secondary | ICD-10-CM | POA: Diagnosis not present

## 2023-10-27 DIAGNOSIS — R7989 Other specified abnormal findings of blood chemistry: Secondary | ICD-10-CM

## 2023-10-27 DIAGNOSIS — F32A Depression, unspecified: Secondary | ICD-10-CM

## 2023-10-27 DIAGNOSIS — D631 Anemia in chronic kidney disease: Secondary | ICD-10-CM

## 2023-10-27 DIAGNOSIS — R1011 Right upper quadrant pain: Secondary | ICD-10-CM

## 2023-10-27 DIAGNOSIS — C61 Malignant neoplasm of prostate: Secondary | ICD-10-CM

## 2023-10-27 DIAGNOSIS — E162 Hypoglycemia, unspecified: Secondary | ICD-10-CM

## 2023-10-27 DIAGNOSIS — Z85028 Personal history of other malignant neoplasm of stomach: Secondary | ICD-10-CM

## 2023-10-27 DIAGNOSIS — C169 Malignant neoplasm of stomach, unspecified: Secondary | ICD-10-CM

## 2023-10-27 DIAGNOSIS — I4719 Other supraventricular tachycardia: Secondary | ICD-10-CM

## 2023-10-27 LAB — BASIC METABOLIC PANEL WITH GFR
Anion gap: 11 (ref 5–15)
BUN: 18 mg/dL (ref 8–23)
CO2: 21 mmol/L — ABNORMAL LOW (ref 22–32)
Calcium: 8.2 mg/dL — ABNORMAL LOW (ref 8.9–10.3)
Chloride: 101 mmol/L (ref 98–111)
Creatinine, Ser: 4.57 mg/dL — ABNORMAL HIGH (ref 0.61–1.24)
GFR, Estimated: 13 mL/min — ABNORMAL LOW (ref >60)
Glucose, Bld: 89 mg/dL (ref 70–99)
Potassium: 4.6 mmol/L (ref 3.5–5.1)
Sodium: 133 mmol/L — ABNORMAL LOW (ref 135–145)

## 2023-10-27 LAB — HEPATIC FUNCTION PANEL
ALT: 58 U/L — ABNORMAL HIGH (ref 0–44)
AST: 68 U/L — ABNORMAL HIGH (ref 15–41)
Albumin: 2.8 g/dL — ABNORMAL LOW (ref 3.5–5.0)
Alkaline Phosphatase: 150 U/L — ABNORMAL HIGH (ref 38–126)
Bilirubin, Direct: 0.5 mg/dL — ABNORMAL HIGH (ref 0.0–0.2)
Indirect Bilirubin: 1.2 mg/dL — ABNORMAL HIGH (ref 0.3–0.9)
Total Bilirubin: 1.7 mg/dL — ABNORMAL HIGH (ref 0.0–1.2)
Total Protein: 6 g/dL — ABNORMAL LOW (ref 6.5–8.1)

## 2023-10-27 LAB — FERRITIN: Ferritin: 1814 ng/mL — ABNORMAL HIGH (ref 24–336)

## 2023-10-27 LAB — GLUCOSE, CAPILLARY
Glucose-Capillary: 87 mg/dL (ref 70–99)
Glucose-Capillary: 96 mg/dL (ref 70–99)

## 2023-10-27 LAB — IRON AND TIBC
Iron: 170 ug/dL (ref 45–182)
Saturation Ratios: 76 % — ABNORMAL HIGH (ref 17.9–39.5)
TIBC: 223 ug/dL — ABNORMAL LOW (ref 250–450)
UIBC: 53 ug/dL

## 2023-10-27 LAB — CBC WITH DIFFERENTIAL/PLATELET
Abs Immature Granulocytes: 0.03 K/uL (ref 0.00–0.07)
Basophils Absolute: 0 K/uL (ref 0.0–0.1)
Basophils Relative: 0 %
Eosinophils Absolute: 0.2 K/uL (ref 0.0–0.5)
Eosinophils Relative: 5 %
HCT: 31.2 % — ABNORMAL LOW (ref 39.0–52.0)
Hemoglobin: 10.4 g/dL — ABNORMAL LOW (ref 13.0–17.0)
Immature Granulocytes: 1 %
Lymphocytes Relative: 12 %
Lymphs Abs: 0.6 K/uL — ABNORMAL LOW (ref 0.7–4.0)
MCH: 32.2 pg (ref 26.0–34.0)
MCHC: 33.3 g/dL (ref 30.0–36.0)
MCV: 96.6 fL (ref 80.0–100.0)
Monocytes Absolute: 0.5 K/uL (ref 0.1–1.0)
Monocytes Relative: 11 %
Neutro Abs: 3.2 K/uL (ref 1.7–7.7)
Neutrophils Relative %: 71 %
Platelets: 168 K/uL (ref 150–400)
RBC: 3.23 MIL/uL — ABNORMAL LOW (ref 4.22–5.81)
RDW: 15.9 % — ABNORMAL HIGH (ref 11.5–15.5)
WBC: 4.6 K/uL (ref 4.0–10.5)
nRBC: 0 % (ref 0.0–0.2)

## 2023-10-27 LAB — CBG MONITORING, ED
Glucose-Capillary: 84 mg/dL (ref 70–99)
Glucose-Capillary: 76 mg/dL (ref 70–99)
Glucose-Capillary: 76 mg/dL (ref 70–99)
Glucose-Capillary: 92 mg/dL (ref 70–99)
Glucose-Capillary: 62 mg/dL — ABNORMAL LOW (ref 70–99)

## 2023-10-27 LAB — HEPARIN LEVEL (UNFRACTIONATED)
Heparin Unfractionated: <0.1 [IU]/mL — ABNORMAL LOW (ref 0.30–0.70)
Heparin Unfractionated: >1.1 [IU]/mL — ABNORMAL HIGH (ref 0.30–0.70)
Heparin Unfractionated: 0.74 [IU]/mL — ABNORMAL HIGH (ref 0.30–0.70)

## 2023-10-27 LAB — TSH: TSH: 1.404 u[IU]/mL (ref 0.350–4.500)

## 2023-10-27 LAB — LIPASE, BLOOD: Lipase: 40 U/L (ref 11–51)

## 2023-10-27 LAB — HEPATITIS B SURFACE ANTIGEN: Hepatitis B Surface Ag: NONREACTIVE

## 2023-10-27 MED ORDER — HEPARIN (PORCINE) 25000 UT/250ML-% IV SOLN
1050.0000 [IU]/h | INTRAVENOUS | Status: DC
  Administered 2023-10-27: 1150 [IU]/h via INTRAVENOUS
  Filled 2023-10-27: qty 250

## 2023-10-27 MED ORDER — VENLAFAXINE HCL ER 75 MG PO CP24
75.0000 mg | ORAL_CAPSULE | Freq: Every day | ORAL | Status: DC
  Administered 2023-10-27: 75 mg via ORAL
  Filled 2023-10-27: qty 1

## 2023-10-27 MED ORDER — METOPROLOL TARTRATE 25 MG PO TABS: 25.0000 mg | ORAL_TABLET | Freq: Two times a day (BID) | ORAL | Status: AC

## 2023-10-27 MED ORDER — SEVELAMER CARBONATE 800 MG PO TABS
800.0000 mg | ORAL_TABLET | Freq: Three times a day (TID) | ORAL | Status: DC
  Administered 2023-10-27 (×2): 800 mg via ORAL
  Filled 2023-10-27 (×2): qty 1

## 2023-10-27 MED ORDER — PANTOPRAZOLE SODIUM 40 MG PO TBEC: 40.0000 mg | DELAYED_RELEASE_TABLET | Freq: Two times a day (BID) | ORAL | Status: AC

## 2023-10-27 MED ORDER — VERAPAMIL HCL 40 MG PO TABS
40.0000 mg | ORAL_TABLET | Freq: Three times a day (TID) | ORAL | Status: DC
  Administered 2023-10-27: 40 mg via ORAL
  Filled 2023-10-27 (×4): qty 1

## 2023-10-27 MED ORDER — ASPIRIN EC 81 MG PO TBEC: 81.0000 mg | DELAYED_RELEASE_TABLET | Freq: Every day | ORAL | Status: AC

## 2023-10-27 MED ORDER — ALLOPURINOL 300 MG PO TABS
150.0000 mg | ORAL_TABLET | Freq: Every day | ORAL | Status: DC
Start: 2023-10-27 — End: 2023-10-28
  Administered 2023-10-27: 150 mg via ORAL
  Filled 2023-10-27: qty 2

## 2023-10-27 MED ORDER — ASPIRIN 81 MG PO TBEC
81.0000 mg | DELAYED_RELEASE_TABLET | Freq: Every day | ORAL | Status: DC
Start: 2023-10-27 — End: 2023-10-28
  Administered 2023-10-27: 81 mg via ORAL
  Filled 2023-10-27: qty 1

## 2023-10-27 MED ORDER — CHLORHEXIDINE GLUCONATE CLOTH 2 % EX PADS: 6.0000 | MEDICATED_PAD | Freq: Every day | CUTANEOUS | Status: DC

## 2023-10-27 MED ORDER — ROSUVASTATIN CALCIUM 5 MG PO TABS: 5.0000 mg | ORAL_TABLET | Freq: Every day | ORAL | Status: DC

## 2023-10-27 MED ORDER — BUPROPION HCL ER (XL) 150 MG PO TB24
300.0000 mg | ORAL_TABLET | Freq: Every day | ORAL | Status: DC
  Administered 2023-10-27: 300 mg via ORAL
  Filled 2023-10-27: qty 2

## 2023-10-27 MED ORDER — TAMSULOSIN HCL 0.4 MG PO CAPS
0.4000 mg | ORAL_CAPSULE | Freq: Every day | ORAL | Status: DC
  Administered 2023-10-27: 0.4 mg via ORAL
  Filled 2023-10-27: qty 1

## 2023-10-27 MED ORDER — ENSURE PLUS HIGH PROTEIN PO LIQD: 237.0000 mL | Freq: Two times a day (BID) | ORAL | Status: DC

## 2023-10-27 MED ORDER — HEPARIN SODIUM (PORCINE) 5000 UNIT/ML IJ SOLN
5000.0000 [IU] | Freq: Three times a day (TID) | INTRAMUSCULAR | Status: DC
  Administered 2023-10-27: 5000 [IU] via SUBCUTANEOUS
  Filled 2023-10-27: qty 1

## 2023-10-27 MED ORDER — MELATONIN 3 MG PO TABS
3.0000 mg | ORAL_TABLET | Freq: Once | ORAL | Status: AC
  Administered 2023-10-27: 3 mg via ORAL
  Filled 2023-10-27: qty 1

## 2023-10-27 MED ORDER — METOPROLOL TARTRATE 25 MG PO TABS
25.0000 mg | ORAL_TABLET | Freq: Two times a day (BID) | ORAL | Status: DC
  Administered 2023-10-27 (×2): 25 mg via ORAL
  Filled 2023-10-27 (×2): qty 1

## 2023-10-27 MED ORDER — PANTOPRAZOLE SODIUM 40 MG PO TBEC
40.0000 mg | DELAYED_RELEASE_TABLET | Freq: Two times a day (BID) | ORAL | Status: DC
  Administered 2023-10-27 (×2): 40 mg via ORAL
  Filled 2023-10-27 (×2): qty 1

## 2023-10-27 NOTE — Progress Notes (Signed)
 Pt receives out-pt HD at [facility] on MWF 11:00 am chair time. Will assist as needed.   Randine Mungo Dialysis Navigator [phone]

## 2023-10-27 NOTE — Assessment & Plan Note (Addendum)
 Concern of atrial tachycardia/atrial flutter on admission, cardiology was consulted and they were recommending continue verapamil .  TSH was normal.CHA2DS2-VASc Score = 6  Patient was started on heparin  infusion by cardiology and echocardiogram was ordered Heart rate within goal now -Continue to monitor -Continue with verapamil  -Follow-up echocardiogram -Continue with heparin  infusion

## 2023-10-27 NOTE — ED Notes (Signed)
 PT sitting on side of bed to eat.

## 2023-10-27 NOTE — Assessment & Plan Note (Addendum)
 With mild transaminitis which started improving, GI symptoms improving likely some viral gastroenteritis.  MRCP with concern of choledocholithiasis, patient need ERCP at a tertiary care center due to his altered anatomy with prior gastric surgeries, will be high risk. Stool studies ordered but patient did not had any further bowel movements since in the hospital. -Follow-up GI recommendations -Supportive care

## 2023-10-27 NOTE — Telephone Encounter (Signed)
 Patient Product/process development scientist completed.    The patient is insured through [facility]. Patient has Medicare and is not eligible for a copay card, but may be able to apply for patient assistance or Medicare RX Payment Plan (Patient Must reach out to their plan, if eligible for payment plan), if available.    Ran test claim for Eliquis 5 mg and the current 30 day co-pay is $157.20 due to a deductible.  Ran test claim for Xarelto 20 mg and the current 30 day co-pay is $157.20 due to a deductible.  This test claim was processed through [facility]- copay amounts may vary at other pharmacies due to pharmacy/plan contracts, or as the patient moves through the different stages of their insurance plan.     Jacob [name], CPHT Pharmacy Technician Patient Advocate Specialist Lead [facility] Patient Advocate Team Direct Number: [phone]  Fax: [phone]

## 2023-10-27 NOTE — Assessment & Plan Note (Signed)
 CBC seems stable around baseline. - Continue to monitor

## 2023-10-27 NOTE — ED Notes (Signed)
 PT aware a stool sample is needed.

## 2023-10-27 NOTE — Hospital Course (Addendum)
 Partly taken from H&P.  Jacob [name] is a 78 y.o. 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  Patient also has been having some watery diarrhea.   Following dialysis he went to urgent care and then referred to ED.  On presentation patient was tachycardic, mildly tachypneic, initial concern of atrial flutter so cardiology was consulted and patient was given a dose of p.o. metoprolol .  Patient was also found to be hypoglycemic and was given 2 doses of D50 followed by started on D10 as glucose level did not improve with D50. Labs pertinent for transaminitis, T. bili of 2.4, alkaline phosphatase 169, lipase 43. CT abdomen and pelvis only shows cholelithiasis and no other significant acute abnormality. RUQ ultrasound did show gallstones but no cholecystitis.  MRCP was ordered and GI was consulted.  Stool studies ordered.   10/14: Vitals mostly stable, Labs with mild hyponatremia with sodium at 133, renal function consistent with ESRD, CBC stable, improving transaminitis.  MRCP was negative for cholecystitis but did show a small filling defect in CBD suspicious for choledocholithiasis.  Patient need ERCP but unfortunately due to his prior history of gastric cancer and surgery he will be high risk and need to go to a tertiary care center per GI.  They are trying to talk with GI at [facility] and if they accept him, [facility] will need to initiate transfer.  Addendum.  Patient was accepted at [facility], no bed availability.  Patient will need to be discharged and EMTALA need to be filled out once bed is available. GI wants hospitalist who is going to take over tomorrow to leave their number with transfer center.

## 2023-10-27 NOTE — Consult Note (Signed)
 Renal Service Consult Note [facility] Lamar JONETTA Fret, MD  Patient: Jacob [name] Date: 10/27/2023 Requesting Physician: Dr. Caleen   Reason for Consult: ESRD pt w/ abd pain and diarrhea HPI: The patient is a 78 y.o. year-old w/ PMH as below who presented to ED yesterday complaining of epigastric abdominal pain, poor appetite and watery diarrhea for the last 3 to 4 days.  Has been having some heart palpitations as well.  No SOB or CP.  Patient had dialysis earlier in the day.  In the ED BP was 140/90, HR 112-123, RR 16-19, temp 98.1.  O2 sat 98% on room air.  Labs showed K+ 4.6, BUN 18, creatinine 4.7.  Albumin  2.8, total bilirubin 1.7, WBC 4K.  Hgb 10.4.  Chest x-ray was negative.  CT abdomen showed no acute process but did show gallstones.  Cardiology was consulted for atrial flutter.  Cardiology felt it was likely atrial tachycardia and patient was given a dose of p.o. metoprolol .  Patient also got D10 for hypoglycemia.  Patient was admitted.  We are asked to see for dialysis.   Pt seen in room. No c/o's today. No SOB, leg swelling. No CP. Has been on HD for 1 year.    ROS - denies CP, no joint pain, no HA, no blurry vision, no rash, no diarrhea, no nausea/ vomiting   Past Medical History  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, has had transfusion   Anxiety and depression    Arthritis    gout   Back pain    BPH with obstruction/lower urinary tract symptoms    Chronic kidney disease    Stage 5 kidney   Constipation    ED (erectile dysfunction)    GERD (gastroesophageal reflux disease)    Hypercholesterolemia    Hypertension    Night sweats    Post-operative nausea and vomiting 04/09/2016   Prostate cancer (HCC) 08/14/2011   Adenocarcinoma,gleason:3+3=6,& 3+4=7,PSA=5.66, s/p radiation therapy   PUD (peptic ulcer disease)    Sleep apnea    does not use Cpap but occasionally uses O2 @ 2L qhs   Stomach cancer  (HCC)    Ulcer    peptic ulcer hx   Past Surgical History  Past Surgical History:  Procedure Laterality Date   AV FISTULA PLACEMENT Right 02/19/2021   Procedure: ARTERIOVENOUS (AV) FISTULA CREATION;  Surgeon: Lanis Fonda BRAVO, MD;  Location: [facility] OR;  Service: Vascular;  Laterality: Right;   AV FISTULA PLACEMENT Right 04/21/2022   Procedure: INSERTION OF RIGHT ARM ARTERIOVENOUS (AV) GORE-TEX GRAFT;  Surgeon: Lanis Fonda BRAVO, MD;  Location: [facility] OR;  Service: Vascular;  Laterality: Right;   BASCILIC VEIN TRANSPOSITION Right 07/23/2021   Procedure: RIGHT ARM BASILIC VEIN TRANSPOSITION;  Surgeon: Lanis Fonda BRAVO, MD;  Location: [facility] OR;  Service: Vascular;  Laterality: Right;  PERIPHERAL NERVE BLOCK   colon polyps bx  11/24/06   colon,transverse and rectosigmoid polyps:tubular adenomas and hyperplastic polyps,no high grade dysplasia or malignancy    COLONOSCOPY WITH PROPOFOL  N/A 08/24/2019   Procedure: COLONOSCOPY WITH PROPOFOL ;  Surgeon: San Sandor GAILS, DO;  Location: [facility];  Service: Gastroenterology;  Laterality: N/A;   duodenal bx  11/24/06   benign   ESOPHAGOGASTRODUODENOSCOPY N/A 10/27/2015   Procedure: ESOPHAGOGASTRODUODENOSCOPY (EGD);  Surgeon: Lupita BRAVO Commander, MD;  Location: [facility];  Service: Endoscopy;  Laterality: N/A;   EUS N/A 11/08/2015   Procedure: UPPER ENDOSCOPIC ULTRASOUND (EUS) RADIAL;  Surgeon: Toribio SHAUNNA Cedar, MD;  Location: [facility];  Service: Endoscopy;  Laterality: N/A;   GASTRECTOMY N/A 03/25/2016   Procedure: DISTAL GASTRECTOMY;  Surgeon: Jina Nephew, MD;  Location: [facility] OR;  Service: General;  Laterality: N/A;   gastric bx  11/24/06   chronic active gastritis,with metaplasia and focal changaes of xanthelasma   GASTROSTOMY N/A 03/25/2016   Procedure: INSERTION OF FEEDING TUBE;  Surgeon: Jina Nephew, MD;  Location: [facility] OR;  Service: General;  Laterality: N/A;   INSERTION OF DIALYSIS CATHETER Left 04/21/2022   Procedure: INSERTION OF TUNNELED DIALYSIS CATHETER,  LEFT INTERNAL JUGULAR;  Surgeon: Lanis Fonda BRAVO, MD;  Location: [facility] OR;  Service: Vascular;  Laterality: Left;   INSERTION PROSTATE RADIATION SEED  12-29-11   INTRAMEDULLARY (IM) NAIL INTERTROCHANTERIC Right 07/03/2022   Procedure: OPEN TREATMENT RIGHT HIP FRACTURE;  Surgeon: Elsa Lonni SAUNDERS, MD;  Location: [facility] OR;  Service: Orthopedics;  Laterality: Right;   IR GENERIC HISTORICAL  04/01/2016   IR GJ TUBE CHANGE 04/01/2016 Juliene Balder, MD [facility]   IR GENERIC HISTORICAL  04/05/2016   IR GJ TUBE CHANGE 04/05/2016 Juliene Balder, MD [facility]   LAPAROSCOPIC GASTRECTOMY  03/25/2016   Diagnostic laparoscopy, distal gastrectomy with Billroth 2 reconstruction and gastrojejunostomy tube   LAPAROSCOPY N/A 03/25/2016   Procedure: DIAGNOSTIC LAPAROSCOPY;  Surgeon: Jina Nephew, MD;  Location: [facility] OR;  Service: General;  Laterality: N/A;   POLYPECTOMY  08/24/2019   Procedure: POLYPECTOMY;  Surgeon: San Sandor GAILS, DO;  Location: [facility];  Service: Gastroenterology;;   PORTACATH PLACEMENT Left 12/04/2015   Procedure: INSERTION PORT-A-CATH;  Surgeon: Jina Nephew, MD;  Location: [facility] OR;  Service: General;  Laterality: Left;   PROSTATE BIOPSY  08/14/11   Adenocarcinoma/volume=58.51cc,gleason=3+3=6 & 3+4=7   TONSILLECTOMY     78 years old   Family History  Family History  Problem Relation Age of Onset   Cancer Mother        NOS   Alcohol abuse Father    Alcohol abuse Brother 62   Cancer Paternal Aunt        NOS   Colon cancer Neg Hx    Social History  reports that he quit smoking about 23 years ago. His smoking use included cigarettes. He started smoking about 53 years ago. He has a 45 pack-year smoking history. He has been exposed to tobacco smoke. He has never used smokeless tobacco. He reports that he does not currently use alcohol after a past usage of about 1.0 standard drink of alcohol per week. He reports that he does not use drugs. Allergies  Allergies  Allergen Reactions    Lisinopril Cough   Home medications Prior to Admission medications   Medication Sig Start Date End Date Taking? Authorizing Provider  acetaminophen  (TYLENOL ) 500 MG tablet Take 1,000-1,500 mg by mouth daily as needed for moderate pain or headache.   Yes [provider]  allopurinol  (ZYLOPRIM ) 300 MG tablet Take 150 mg by mouth daily. 04/15/19  Yes [provider]  aspirin  EC 81 MG tablet Take 4 tablets (325 mg total) by mouth daily. Swallow whole. Patient taking differently: Take 81 mg by mouth daily. Swallow whole. 07/09/22  Yes Tobie Lola PARAS, MD  buPROPion  (WELLBUTRIN  XL) 300 MG 24 hr tablet Take 300 mg by mouth daily.  05/10/15  Yes [provider]  cetirizine (ZYRTEC) 10 MG tablet Take 10 mg by mouth daily.   Yes [provider]  Cholecalciferol (VITAMIN D3) 10 MCG (400 UNIT)  CAPS Take 1 capsule (400 Units total) by mouth daily. 04/21/22  Yes Schuh, McKenzi P, PA-C  furosemide  (LASIX ) 40 MG tablet Take 40 mg by mouth daily.   Yes [provider]  Melatonin 10 MG TABS Take 10 mg by mouth at bedtime.   Yes [provider]  polyethylene glycol powder (GLYCOLAX /MIRALAX ) 17 GM/SCOOP powder Take 17 g by mouth 2 (two) times daily. Until daily soft stools OTC 02/22/22  Yes Vicky Charleston, PA-C  rosuvastatin  (CRESTOR ) 5 MG tablet Take 5 mg by mouth daily. 02/05/21  Yes [provider]  sevelamer  carbonate (RENVELA ) 800 MG tablet Take 800 mg by mouth 3 (three) times daily with meals. 10/20/22  Yes [provider]  tamsulosin  (FLOMAX ) 0.4 MG CAPS capsule Take 1 capsule by mouth every day 05/05/23  Yes McKenzie, Belvie CROME, MD  venlafaxine  XR (EFFEXOR -XR) 75 MG 24 hr capsule Take 75 mg by mouth daily. 07/05/21  Yes [provider]  verapamil  (CALAN ) 40 MG tablet Take 40 mg by mouth 3 (three) times daily. 02/11/23  Yes [provider]  Nebivolol  HCl 20 MG TABS Take 20 mg by mouth at bedtime.    [provider]      Vitals:   10/27/23 0700 10/27/23 0939 10/27/23 1121 10/27/23 1123  BP: (!) 136/93  125/82   Pulse: 80   62  Resp: (!) 22     Temp:  98.3 F (36.8 C)    TempSrc:  Oral    SpO2: 99%     Weight:      Height:       Exam Gen alert, no distress Sclera anicteric, throat clear  No jvd or bruits Chest clear bilat to bases RRR no MRG Abd soft ntnd no mass or ascites +bs Ext no LE or UE edema, no other edema Neuro is alert, Ox 3 , nf    LUA AVG+bruit   Home bp meds: Lasix  40mg  every day Verapamil  40mg  tid Nebivolol  20mg  at bedtime    OP HD: MWF G-O 4h  B400  81kg  2K bath  AVG   Heparin  1500 Mircera 50 mcg q 2, last 10/3, due 10/17 Last OP HD 10/13, post wt 80.5kg Gets to dry wt or under Wt gains 1.5- 3kg max     Assessment/ Plan: Atrial flutter w/ variable block: cardiology assessing, started metoprolol  25 bid and verapamil  dc'd (was taking for BP). TTE ordered.  Abd pain/ ^LFT's: w/ gallstones on CT. MRCP ordered. GI consulting.  ESRD: on HD MWF. Has not missed HD. Plan HD tomorrow.  HTN: bp's stable wnl range. Verapamil  dc'd and now getting metoprolol  25 bid per cardiology.  Volume: no vol excess, gets to dry wt. 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 Next esa due 10/17, will order if Hb < 10-11.        Jacob Fret  MD [facility] 10/27/2023, 12:36 PM  Recent Labs  Lab 10/26/23 1749 10/26/23 2220 10/27/23 0115  HGB 10.7* 10.5* 10.4*  ALBUMIN  3.0*  --  2.8*  CALCIUM  8.2*  --  8.2*  CREATININE 3.77*  --  4.57*  K 4.4  --  4.6   Inpatient medications:  allopurinol   150 mg Oral Daily   aspirin  EC  81 mg Oral Daily   buPROPion   300 mg Oral Daily   pantoprazole   40 mg Oral BID   sevelamer  carbonate  800 mg Oral TID WC   tamsulosin   0.4 mg Oral Daily  venlafaxine  XR  75 mg Oral Daily   verapamil   40 mg Oral TID    dextrose  50 mL/hr at 10/27/23 0012   heparin  1,150 Units/hr (10/27/23 1128)

## 2023-10-27 NOTE — Progress Notes (Signed)
 PHARMACY - ANTICOAGULATION CONSULT NOTE  Pharmacy Consult for heparin   Indication: atrial fibrillation  Allergies  Allergen Reactions   Lisinopril Cough    Patient Measurements: Height: 6' 1 (185.4 cm) Weight: 79.6 kg (175 lb 7.8 oz) IBW/kg (Calculated) : 79.9 HEPARIN  DW (KG): 79.6  Vital Signs: Temp: 97.6 F (36.4 C) (10/14 1959) Temp Source: Oral (10/14 1959) BP: 122/81 (10/14 1959) Pulse Rate: 71 (10/14 1959)  Labs: Recent Labs    10/26/23 1749 10/26/23 2017 10/26/23 2220 10/27/23 0115 10/27/23 0940 10/27/23 1845 10/27/23 2030  HGB 10.7*  --  10.5* 10.4*  --   --   --   HCT 33.5*  --  32.5* 31.2*  --   --   --   PLT 156  --   --  168  --   --   --   HEPARINUNFRC  --   --   --   --  <0.10* >1.10* 0.74*  CREATININE 3.77*  --   --  4.57*  --   --   --   TROPONINIHS 26* 24*  --   --   --   --   --     Estimated Creatinine Clearance: 15.2 mL/min (A) (by C-G formula based on SCr of 4.57 mg/dL (H)).   Assessment: Patient presenting with CC of abdominal pain, found to have new Afib. HgB 10.4 and PLTs 168. Not on anticoagulation PTA. Pharmacy consulted to dose heparin .   Heparin  level >1.1 earlier this evening. Pt is restricted on one side due to HD fistula so heparin  level has to be drawn from same arm heparin  is running in. Repeat level obtained where RN held heparin  for a few minutes and then flushed really well. This level is 0.74 (slightly supratherapeutic) on infusion at 1150 units/hr. No issues with line or bleeding reported per RN.  Goal of Therapy:  Heparin  level 0.3-0.7 units/ml Monitor platelets by anticoagulation protocol: Yes   Plan:  Decrease heparin  slightly to 1050 units/hr Will f/u 8 hr heparin  level  Vito Ralph, PharmD, BCPS Please see amion for complete clinical pharmacist phone list 10/27/2023,10:42 PM

## 2023-10-27 NOTE — Assessment & Plan Note (Signed)
 Continue home allopurinol

## 2023-10-27 NOTE — Assessment & Plan Note (Signed)
 Patient also has an history of MGUS -Outpatient follow-up

## 2023-10-27 NOTE — Progress Notes (Addendum)
 Contacted regarding this patient with history of T3N0 Gastric cancer, BII procedure, feeding tube, chemo in 2017 who presents with abdominal pain, elevated LFT's, likely choledocholithiasis, without evidence of cholecystitis. GI has recommended transfer to [facility] for ERCP. Agree. No role for urgent cholecystectomy and this can also be done at a tertiary facility after clearance of the common bile duct. Soonest availability for cholecystectomy with IOC here would be Thursday 10/16, barring any surgical emergencies that come in. If cholangiogram confirmed choledocholithiasis then he would still need to go to a tertiary center for ERCP.  Please call us  back as needed.   Almarie Pringle, PA-C [facility] Please see Amion for pager number during day hours 7:00am-4:30pm

## 2023-10-27 NOTE — Assessment & Plan Note (Signed)
No acute concern. -Outpatient follow-up

## 2023-10-27 NOTE — Assessment & Plan Note (Signed)
 Blood pressure within goal. - Continue verapamil 

## 2023-10-27 NOTE — ED Notes (Signed)
 PT resting comfortably in bed at this time. Call light within reach, pt encouraged to use when needs arise. PT agreeable to this at this time. Breathing is even and unlabored.  Lights dimmed for patients comfort. PT on room air.  PT updated on time for MRI.

## 2023-10-27 NOTE — Progress Notes (Addendum)
 Progress Note   Patient: Jacob [name] FMW:[id number] DOB: 1945/05/22 DOA: 10/26/2023     1 DOS: the patient was seen and examined on 10/27/2023   Brief hospital course: Partly taken from H&P.  Jacob [name] is a 78 y.o. 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  Patient also has been having some watery diarrhea.   Following dialysis he went to urgent care and then referred to ED.  On presentation patient was tachycardic, mildly tachypneic, initial concern of atrial flutter so cardiology was consulted and patient was given a dose of p.o. metoprolol .  Patient was also found to be hypoglycemic and was given 2 doses of D50 followed by started on D10 as glucose level did not improve with D50. Labs pertinent for transaminitis, T. bili of 2.4, alkaline phosphatase 169, lipase 43. CT abdomen and pelvis only shows cholelithiasis and no other significant acute abnormality. RUQ ultrasound did show gallstones but no cholecystitis.  MRCP was ordered and GI was consulted.  Stool studies ordered.   10/14: Vitals mostly stable, Labs with mild hyponatremia with sodium at 133, renal function consistent with ESRD, CBC stable, improving transaminitis.  MRCP was negative for cholecystitis but did show a small filling defect in CBD suspicious for choledocholithiasis.  Patient need ERCP but unfortunately due to his prior history of gastric cancer and surgery he will be high risk and need to go to a tertiary care center per GI.  They are trying to talk with GI at [facility] and if they accept him, [facility] will need to initiate transfer.  Addendum.  Patient was accepted at [facility], no bed availability.  Patient will need to be  discharged and EMTALA need to be filled out once bed is available. GI wants hospitalist who is going to take over tomorrow to leave their number with transfer center.   Assessment and Plan: * Abdominal pain With mild transaminitis which started improving, GI symptoms improving likely some viral gastroenteritis.  MRCP with concern of choledocholithiasis, patient need ERCP at a tertiary care center due to his altered anatomy with prior gastric surgeries, will be high risk. Stool studies ordered but patient did not had any further bowel movements since in the hospital. -Follow-up GI recommendations -Supportive care  Atrial tachycardia Concern of atrial tachycardia/atrial flutter on admission, cardiology was consulted and they were recommending continue verapamil .  TSH was normal.CHA2DS2-VASc Score = 6  Patient was started on heparin  infusion by cardiology and echocardiogram was ordered Heart rate within goal now -Continue to monitor -Continue with verapamil  -Follow-up echocardiogram -Continue with heparin  infusion  ESRD (end stage renal disease) on dialysis [facility]) Patient is on MWF schedule, last dialysis was on Monday. -Nephrology was consulted to continue routine dialysis  Hypoglycemia CBG now mostly in 90s.  No history of diabetes, likely secondary to poor p.o. intake. Continuing D10 for now as patient is n.p.o. for MRCP -Will add diet and slowly wean from D10 when appropriate  Essential hypertension Blood pressure within goal. - Continue verapamil   Gastric adenocarcinoma (HCC) No acute concern. - Outpatient follow-up  Prostate cancer [facility]) Patient also has an history of MGUS -Outpatient follow-up  Anemia in chronic kidney disease CBC seems stable around baseline. - Continue to monitor  Anxiety and  depression - Continue home Effexor  and Wellbutrin   History of gout - Continue home allopurinol    Subjective: Patient was seen and examined today.  Pain improved, did not  had any more dialysis yet.  No nausea or vomiting.  Patient wants to eat.  Awaiting MRCP  Physical Exam: Vitals:   10/27/23 1300 10/27/23 1400 10/27/23 1411 10/27/23 1440  BP: 128/83 114/76  119/76  Pulse: 84     Resp: 19 15  18   Temp:   98.1 F (36.7 C) (!) 97.3 F (36.3 C)  TempSrc:   Oral Oral  SpO2:      Weight:    79.6 kg  Height:    6' 1 (1.854 m)   General.  Frail elderly man, in no acute distress. Pulmonary.  Lungs clear bilaterally, normal respiratory effort. CV.  Regular rate and rhythm, no JVD, rub or murmur. Abdomen.  Soft, nontender, nondistended, BS positive. CNS.  Alert and oriented .  No focal neurologic deficit. Extremities.  No edema, no cyanosis, pulses intact and symmetrical. Psychiatry.  Judgment and insight appears normal.   Data Reviewed: Prior data reviewed  Family Communication: Talked with son on phone.  Disposition: Status is: Inpatient Remains inpatient appropriate because: Severity of illness  Planned Discharge Destination: Home  DVT prophylaxis.  Heparin  infusion Time spent: 50 minutes  This record has been created using Conservation officer, historic buildings. Errors have been sought and corrected,but may not always be located. Such creation errors do not reflect on the standard of care.   Author: Amaryllis Dare, MD 10/27/2023 4:15 PM  For on call review [email or web address].

## 2023-10-27 NOTE — Discharge Summary (Addendum)
 Physician Discharge Summary   Patient: Jacob [name] MRN: [id number] DOB: [birth year]  Admit date:     10/26/2023  Discharge date: 10/27/23  Discharge Physician: Dorn Dawson   PCP: [name], MD    Discharge Diagnoses: Principal Problem:   Abdominal pain Active Problems:   Atrial tachycardia   ESRD (end stage renal disease) on dialysis (HCC)   Hypoglycemia   Essential hypertension   Gastric adenocarcinoma (HCC)   Prostate cancer (HCC)   Anemia in chronic kidney disease   Anxiety and depression   History of gout   Atrial flutter (HCC)   Atrial flutter with rapid ventricular response (HCC)  Resolved Problems:   * No resolved hospital problems. [facility] Course: Partly taken from H&P.  Jacob [name] is a 78 y.o. 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  Patient also has been having some watery diarrhea.   Following dialysis he went to urgent care and then referred to ED.  On presentation patient was tachycardic, mildly tachypneic, initial concern of atrial flutter so cardiology was consulted and patient was given a dose of p.o. metoprolol .  Patient was also found to be hypoglycemic and was given 2 doses of D50 followed by started on D10 as glucose level did not improve with D50. Labs pertinent for transaminitis, T. bili of 2.4, alkaline phosphatase 169, lipase 43. CT abdomen and pelvis only shows cholelithiasis and no other significant acute abnormality. RUQ ultrasound did show gallstones but no cholecystitis.  MRCP was ordered and GI was consulted.  Stool studies ordered.   10/14: Vitals mostly stable, Labs with mild hyponatremia with sodium at 133, renal  function consistent with ESRD, CBC stable, improving transaminitis.  MRCP was negative for cholecystitis but did show a small filling defect in CBD suspicious for choledocholithiasis.  Patient need ERCP but unfortunately due to his prior history of gastric cancer and surgery he will be high risk and need to go to a tertiary care center per GI.  They are trying to talk with GI at [facility] and if they accept him, [facility] will need to initiate transfer.  Addendum.  Patient was accepted at [facility], no bed availability.  Patient will need to be discharged and EMTALA need to be filled out once bed is available. GI wants hospitalist who is going to take over tomorrow to leave their number with transfer center.  Assessment and Plan:  Choledocholithiasis  With mild transaminitis which started improving,   MRCP with concern of choledocholithiasis,  Per GI patient need ERCP at a tertiary care center due to his altered anatomy with prior gastric surgeries, will be high risk. Stool studies ordered but patient did not had any further bowel movements since in the hospital. Was started on PPI  -Supportive care  Atrial tachycardia Concern of atrial tachycardia/atrial flutter on admission, cardiology was consulted and they were recommending continue verapamil .  TSH was normal.CHA2DS2-VASc Score = 6  Patient was started on heparin  infusion by cardiology and echocardiogram was ordered Heart rate within goal now -Continue to monitor -Continue with metoprolol  -Follow-up echocardiogram -Continue with heparin  infusion  ESRD (end stage renal disease) on dialysis [facility]) Patient is on MWF schedule, last dialysis was on Monday. -Nephrology was consulted  to continue routine dialysis  Hypoglycemia CBG now mostly in 90s.  No history of diabetes, likely secondary to poor p.o. intake. Continuing D10 for now  -Will add diet and slowly wean from D10 when appropriate  Essential hypertension Blood pressure within goal. - Continue  metoprolol    Gastric adenocarcinoma (HCC) No acute concern. - Outpatient follow-up  Prostate cancer [facility]) Patient also has an history of MGUS -Outpatient follow-up  Anemia in chronic kidney disease CBC seems stable around baseline. - Continue to monitor  Anxiety and depression - Continue home Effexor  and Wellbutrin   History of gout - Continue home allopurinol     Consultants: Nephrology, GI, Cardiology  Procedures performed: None  Disposition: To another acute care hospital Diet recommendation:  Soft  DISCHARGE MEDICATION: Allergies as of 10/27/2023       Reactions   Lisinopril Cough        Medication List     STOP taking these medications    furosemide  40 MG tablet Commonly known as: LASIX    polyethylene glycol powder 17 GM/SCOOP powder Commonly known as: GLYCOLAX /MIRALAX    rosuvastatin  5 MG tablet Commonly known as: CRESTOR    verapamil  40 MG tablet Commonly known as: CALAN        TAKE these medications    acetaminophen  500 MG tablet Commonly known as: TYLENOL  Take 1,000-1,500 mg by mouth daily as needed for moderate pain or headache.   allopurinol  300 MG tablet Commonly known as: ZYLOPRIM  Take 150 mg by mouth daily.   aspirin  EC 81 MG tablet Take 1 tablet (81 mg total) by mouth daily. Swallow whole.   buPROPion  300 MG 24 hr tablet Commonly known as: WELLBUTRIN  XL Take 300 mg by mouth daily.   cetirizine 10 MG tablet Commonly known as: ZYRTEC Take 10 mg by mouth daily.   Melatonin 10 MG Tabs Take 10 mg by mouth at bedtime.   metoprolol  tartrate 25 MG tablet Commonly known as: LOPRESSOR  Take 1 tablet (25 mg total) by mouth 2 (two) times daily.   Nebivolol  HCl 20 MG Tabs Take 20 mg by mouth at bedtime.   pantoprazole  40 MG tablet Commonly known as: PROTONIX  Take 1 tablet (40 mg total) by mouth 2 (two) times daily.   sevelamer  carbonate 800 MG tablet Commonly known as: RENVELA  Take 800 mg by mouth 3 (three) times daily with  meals.   tamsulosin  0.4 MG Caps capsule Commonly known as: FLOMAX  Take 1 capsule by mouth every day   venlafaxine  XR 75 MG 24 hr capsule Commonly known as: EFFEXOR -XR Take 75 mg by mouth daily.   Vitamin D3 10 MCG (400 UNIT) Caps Take 1 capsule (400 Units total) by mouth daily.        Discharge Exam: Filed Weights   10/26/23 1747 10/27/23 1440  Weight: 81.6 kg 79.6 kg   See progress note 10/14   Condition at discharge: good  The results of significant diagnostics from this hospitalization (including imaging, microbiology, ancillary and laboratory) are listed below for reference.   Imaging Studies: VAS US  CAROTID Result Date: 10/27/2023 Carotid Arterial Duplex Study Patient Name:  NITISH ROES  Date of Exam:   10/27/2023 Medical Rec #: [id number]            Accession #:    [id number] Date of Birth: Jul 29, 1945           Patient Gender: M Patient Age:   62 years Exam Location:  [facility] Procedure:      VAS US  CAROTID Referring Phys: ZANE  ADAMS --------------------------------------------------------------------------------  Indications:       Bilateral bruits. Risk Factors:      Hypertension, hyperlipidemia. Other Factors:     History of right upper arm AVF.                    ESRD disease on dialysis, GERD. History of gastric cancer. Comparison Study:  No priors. Performing Technologist: Ricka Sturdivant-Jones RDMS, RVT  Examination Guidelines: A complete evaluation includes B-mode imaging, spectral Doppler, color Doppler, and power Doppler as needed of all accessible portions of each vessel. Bilateral testing is considered an integral part of a complete examination. Limited examinations for reoccurring indications may be performed as noted.  Right Carotid Findings: +----------+--------+--------+--------+------------------+------------------+           PSV cm/sEDV cm/sStenosisPlaque DescriptionComments            +----------+--------+--------+--------+------------------+------------------+ CCA Prox  47      16                                                   +----------+--------+--------+--------+------------------+------------------+ CCA Mid                                             intimal thickening +----------+--------+--------+--------+------------------+------------------+ CCA Distal34      14                                intimal thickening +----------+--------+--------+--------+------------------+------------------+ ICA Prox  38      18      1-39%                     intimal thickening +----------+--------+--------+--------+------------------+------------------+ ICA Distal44      21                                                   +----------+--------+--------+--------+------------------+------------------+ ECA       40                                                           +----------+--------+--------+--------+------------------+------------------+ +----------+--------+-------+----------------+-------------------+           PSV cm/sEDV cmsDescribe        Arm Pressure (mmHG) +----------+--------+-------+----------------+-------------------+ [id number]            Multiphasic, WNL                    +----------+--------+-------+----------------+-------------------+ +---------+--------+--+--------+--+---------+ VertebralPSV cm/s25EDV cm/s10Antegrade +---------+--------+--+--------+--+---------+  Left Carotid Findings: +----------+--------+--------+--------+------------------+------------------+           PSV cm/sEDV cm/sStenosisPlaque DescriptionComments           +----------+--------+--------+--------+------------------+------------------+ CCA Prox  69      19                                                   +----------+--------+--------+--------+------------------+------------------+  CCA Mid                                              intimal thickening +----------+--------+--------+--------+------------------+------------------+ CCA Distal41      11                                intimal thickening +----------+--------+--------+--------+------------------+------------------+ ICA Prox  28      11      1-39%                     intimal thickening +----------+--------+--------+--------+------------------+------------------+ ICA Distal45      23                                                   +----------+--------+--------+--------+------------------+------------------+ ECA       42      9                                                    +----------+--------+--------+--------+------------------+------------------+ +----------+--------+--------+----------------+-------------------+           PSV cm/sEDV cm/sDescribe        Arm Pressure (mmHG) +----------+--------+--------+----------------+-------------------+ [id number]              Multiphasic, WNL                    +----------+--------+--------+----------------+-------------------+ +---------+--------+--+--------+--+---------+ VertebralPSV cm/s54EDV cm/s16Antegrade +---------+--------+--+--------+--+---------+   Summary: Right Carotid: Velocities in the right ICA are consistent with a 1-39% stenosis. Left Carotid: Velocities in the left ICA are consistent with a 1-39% stenosis. Vertebrals:  Bilateral vertebral arteries demonstrate antegrade flow. Subclavians: Normal flow hemodynamics were seen in bilateral subclavian              arteries. *See table(s) above for measurements and observations.  Electronically signed by Debby Robertson on 10/27/2023 at 4:29:53 PM.    Final    MR ABDOMEN MRCP WO CONTRAST Result Date: 10/27/2023 CLINICAL DATA:  Abdominal pain and jaundice. Gallstones on ultrasound. History of prostate cancer, iron deficiency anemia and end-stage renal disease. EXAM: MRI ABDOMEN WITHOUT CONTRAST  (INCLUDING MRCP)  TECHNIQUE: Multiplanar multisequence MR imaging of the abdomen was performed. Heavily T2-weighted images of the biliary and pancreatic ducts were obtained, and three-dimensional MRCP images were rendered by post processing. COMPARISON:  Abdominopelvic CT and right upper quadrant abdominal ultrasound 10/26/2023. FINDINGS: Technical note: Despite efforts by the technologist and patient, mild motion artifact is present on today's exam and could not be eliminated. This reduces exam sensitivity and specificity. Lower chest:  No significant findings at the visualized lung bases. Hepatobiliary: The liver has a non cirrhotic morphology. There is diffusely decreased T2 signal throughout the liver with loss of signal on the gradient echo in phase images, most consistent with hemosiderosis. No focal lesions are identified on noncontrast imaging. There are scattered small gallstones without evidence of gallbladder wall thickening or surrounding inflammation. No intra or extrahepatic biliary dilatation. Difficult to exclude a small filling defect dependently in the common bile duct just  above the pancreatic head, best seen on images 15/4 and 12/12. Biliary assessment limited by breathing artifact. Pancreas: Atrophy throughout the pancreatic body and tail without associated ductal dilatation. There is mild focal ductal dilatation of the duct within the uncinate process to 9 mm on image 24/3. No evidence of pancreatic head mass or surrounding inflammation. No evidence of ampullary lesion. Spleen: Normal in size without focal abnormality. Diffusely decreased T2 signal attributed to hemosiderosis. Adrenals/Urinary Tract: Both adrenal glands appear normal. Mild renal cortical thinning bilaterally. There are numerous renal cysts bilaterally, including a large simple cyst in the upper pole of the right kidney which measures 6.6 cm on image 22/3. Known calcified lesion in the lower pole of the right kidney measures 1.4 cm and  demonstrates T1 hyperintensity, likely a complex cyst. This has been stable from previous CTs. No suspicious renal finding or hydronephrosis. Stomach/Bowel: The stomach appears unremarkable for its degree of distension. No evidence of bowel wall thickening, distention or surrounding inflammatory change. Vascular/Lymphatic: There are no enlarged abdominal lymph nodes. Aortic and branch vessel atherosclerosis with stable dilatation of the infrarenal abdominal aorta to 2.9 cm. Other: Mild mesenteric edema without ascites or focal extraluminal fluid collection. No evidence of abdominal wall hernia. Musculoskeletal: No acute or significant osseous findings. Mild multilevel spondylosis. IMPRESSION: 1. Cholelithiasis without evidence of cholecystitis or biliary dilatation. Possible small filling defect dependently in the common bile duct just above the pancreatic head, suspicious for choledocholithiasis. Biliary assessment limited by breathing artifact. 2. 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 3. Atrophy of the pancreatic body and tail with mild focal ductal dilatation in the uncinate process. No evidence of pancreatic head mass or surrounding inflammation. 4. Stable dilatation of the infrarenal abdominal aorta to 2.9 cm. See follow up recommendations from abdominopelvic CT 10/26/2023. 5. Numerous bilateral renal cysts are grossly stable. No specific follow-up imaging recommended for these lesions. Electronically Signed   By: Elsie Perone M.D.   On: 10/27/2023 11:09   MR 3D Recon At Scanner Result Date: 10/27/2023 CLINICAL DATA:  Abdominal pain and jaundice. Gallstones on ultrasound. History of prostate cancer, iron deficiency anemia and end-stage renal disease. EXAM: MRI ABDOMEN WITHOUT CONTRAST  (INCLUDING MRCP) TECHNIQUE: Multiplanar multisequence MR imaging of the abdomen was performed. Heavily  T2-weighted images of the biliary and pancreatic ducts were obtained, and three-dimensional MRCP images were rendered by post processing. COMPARISON:  Abdominopelvic CT and right upper quadrant abdominal ultrasound 10/26/2023. FINDINGS: Technical note: Despite efforts by the technologist and patient, mild motion artifact is present on today's exam and could not be eliminated. This reduces exam sensitivity and specificity. Lower chest:  No significant findings at the visualized lung bases. Hepatobiliary: The liver has a non cirrhotic morphology. There is diffusely decreased T2 signal throughout the liver with loss of signal on the gradient echo in phase images, most consistent with hemosiderosis. No focal lesions are identified on noncontrast imaging. There are scattered small gallstones without evidence of gallbladder wall thickening or surrounding inflammation. No intra or extrahepatic biliary dilatation. Difficult to exclude a small filling defect dependently in the common bile duct just above the pancreatic head, best seen on images 15/4 and 12/12. Biliary assessment limited by breathing artifact. Pancreas: Atrophy throughout the pancreatic body and tail without associated ductal dilatation. There is mild focal ductal dilatation of the duct within the uncinate process to 9 mm on image 24/3. No evidence of  pancreatic head mass or surrounding inflammation. No evidence of ampullary lesion. Spleen: Normal in size without focal abnormality. Diffusely decreased T2 signal attributed to hemosiderosis. Adrenals/Urinary Tract: Both adrenal glands appear normal. Mild renal cortical thinning bilaterally. There are numerous renal cysts bilaterally, including a large simple cyst in the upper pole of the right kidney which measures 6.6 cm on image 22/3. Known calcified lesion in the lower pole of the right kidney measures 1.4 cm and demonstrates T1 hyperintensity, likely a complex cyst. This has been stable from previous CTs.  No suspicious renal finding or hydronephrosis. Stomach/Bowel: The stomach appears unremarkable for its degree of distension. No evidence of bowel wall thickening, distention or surrounding inflammatory change. Vascular/Lymphatic: There are no enlarged abdominal lymph nodes. Aortic and branch vessel atherosclerosis with stable dilatation of the infrarenal abdominal aorta to 2.9 cm. Other: Mild mesenteric edema without ascites or focal extraluminal fluid collection. No evidence of abdominal wall hernia. Musculoskeletal: No acute or significant osseous findings. Mild multilevel spondylosis. IMPRESSION: 1. Cholelithiasis without evidence of cholecystitis or biliary dilatation. Possible small filling defect dependently in the common bile duct just above the pancreatic head, suspicious for choledocholithiasis. Biliary assessment limited by breathing artifact. 2. 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 3. Atrophy of the pancreatic body and tail with mild focal ductal dilatation in the uncinate process. No evidence of pancreatic head mass or surrounding inflammation. 4. Stable dilatation of the infrarenal abdominal aorta to 2.9 cm. See follow up recommendations from abdominopelvic CT 10/26/2023. 5. Numerous bilateral renal cysts are grossly stable. No specific follow-up imaging recommended for these lesions. Electronically Signed   By: Elsie Perone M.D.   On: 10/27/2023 11:09   US  Abdomen Limited RUQ (LIVER/GB) Result Date: 10/26/2023 EXAM: Right Upper Quadrant Abdominal Ultrasound TECHNIQUE: Real-time ultrasonography of the right upper quadrant of the abdomen was performed. COMPARISON: None. CLINICAL HISTORY: Cholelithiasis. FINDINGS: LIVER: The liver demonstrates hyperechoic hepatic parenchyma, suggesting hepatic steatosis. No intrahepatic biliary ductal dilatation. No mass. BILIARY SYSTEM: Layering  small gallstones, measuring up to 9 mm. No gallbladder wall thickening and/or pericholecystic fluid or sonographic murphy sign. Common bile duct measures 4 mm. OTHER: Right upper pole renal cyst, better evaluated on CT. No right upper quadrant ascites. IMPRESSION: 1. Cholelithiasis, without sonographic evidence of acute cholecystitis. 2. Hepatic steatosis. Electronically signed by: Pinkie Pebbles MD 10/26/2023 09:20 PM EDT RP Workstation: [id number]   CT ABDOMEN PELVIS W CONTRAST Result Date: 10/26/2023 EXAM: CT ABDOMEN AND PELVIS WITH CONTRAST 10/26/2023 07:30:25 PM TECHNIQUE: CT of the abdomen and pelvis was performed with the administration of 70 mL of iohexol (OMNIPAQUE) 350 MG/ML injection. Multiplanar reformatted images are provided for review. Automated exposure control, iterative reconstruction, and/or weight-based adjustment of the mA/kV was utilized to reduce the radiation dose to as low as reasonably achievable. COMPARISON: CT abdomen and pelvis dated 02/22/2010. CLINICAL HISTORY: Epigastric pain. Patient on dialysis. FINDINGS: LOWER CHEST: No acute abnormality. LIVER: The liver is unremarkable. GALLBLADDER AND BILE DUCTS: Gallstones are present. No biliary ductal dilatation. SPLEEN: No acute abnormality. PANCREAS: No acute abnormality. ADRENAL GLANDS: No acute abnormality. KIDNEYS, URETERS AND BLADDER: Bilateral renal cysts are again seen. The largest cyst is in the superior pole of the right kidney measuring 6.4 cm. Partially calcified low attenuation area in the right kidney measures 14 mm similar to prior. There is mild nonspecific bilateral perinephric fat stranding. There is no hydronephrosis. No stones in the  kidneys or ureters. Urinary bladder is unremarkable. Per consensus, no follow-up is needed for simple Bosniak type 1 and 2 renal cysts, unless the patient has a malignancy history or risk factors. GI AND BOWEL: Stomach demonstrates no acute abnormality. There are postsurgical changes in  the distal stomach. The appendix is visualized and appears normal. There is descending colon diverticulosis. There is no bowel obstruction. PERITONEUM AND RETROPERITONEUM: No ascites. No free air. VASCULATURE: There are atherosclerotic calcifications of the aorta. Abdominal aortic aneurysm measures 3 cm, unchanged. LYMPH NODES: No lymphadenopathy. REPRODUCTIVE ORGANS: No acute abnormality. BONES AND SOFT TISSUES: Degenerative changes affect the lower lumbar spine. Right hip screw present. There is a small fat-containing left inguinal hernia. No acute osseous abnormality. No focal soft tissue abnormality. IMPRESSION: 1. No acute abdominal or pelvic process identified 2. Cholelithiasis 3. Bilateral renal cysts 4. Partially calcified right renal lesion, stable 5. Descending colonic diverticulosis 6. Abdominal aortic aneurysm measuring 3 cm, unchanged Recommend surveillance ultrasound in 3 years. Electronically signed by: Greig Pique MD 10/26/2023 08:05 PM EDT RP Workstation: [id number]   DG Chest 2 View Result Date: 10/26/2023 CLINICAL DATA:  New atrial fibrillation. EXAM: CHEST - 2 VIEW COMPARISON:  Radiographs 07/02/2022 and 04/21/2022.  CT 12/23/2021. FINDINGS: Left subclavian Port-A-Cath extends to the mid SVC level. The heart size and mediastinal contours are stable. The lungs remain clear. No pleural effusion or pneumothorax. There are surgical clips in the right axilla. The bones appear unremarkable. IMPRESSION: Stable chest. No evidence of acute cardiopulmonary process. Electronically Signed   By: Elsie Perone M.D.   On: 10/26/2023 18:53    Microbiology: Results for orders placed or performed during the hospital encounter of 07/02/22  Surgical pcr screen     Status: None   Collection Time: 07/02/22  5:36 PM   Specimen: Nasal Mucosa; Nasal Swab  Result Value Ref Range Status   MRSA, PCR NEGATIVE NEGATIVE Final   Staphylococcus aureus NEGATIVE NEGATIVE Final    Comment: (NOTE) The Xpert SA  Assay (FDA approved for NASAL specimens in patients 29 years of age and older), is one component of a comprehensive surveillance program. It is not intended to diagnose infection nor to guide or monitor treatment. Performed at [facility], [address], [place], [place] [address]     Labs: CBC: Recent Labs  Lab 10/26/23 1749 10/26/23 2220 10/27/23 0115  WBC 5.7  --  4.6  NEUTROABS 4.5  --  3.2  HGB 10.7* 10.5* 10.4*  HCT 33.5* 32.5* 31.2*  MCV 98.2  --  96.6  PLT 156  --  168   Basic Metabolic Panel: Recent Labs  Lab 10/26/23 1749 10/27/23 0115  NA 135 133*  K 4.4 4.6  CL 104 101  CO2 20* 21*  GLUCOSE 64* 89  BUN 16 18  CREATININE 3.77* 4.57*  CALCIUM  8.2* 8.2*  MG 2.1  --    Liver Function Tests: Recent Labs  Lab 10/26/23 1749 10/27/23 0115  AST 92* 68*  ALT 69* 58*  ALKPHOS 169* 150*  BILITOT 2.4* 1.7*  PROT 6.5 6.0*  ALBUMIN  3.0* 2.8*   CBG: Recent Labs  Lab 10/27/23 0729 10/27/23 0957 10/27/23 1241 10/27/23 1455 10/27/23 2109  GLUCAP 76 92 62* 87 96    Discharge time spent: less than 30 minutes.  Signed: Dorn Dawson, MD [facility] 10/27/2023

## 2023-10-27 NOTE — ED Notes (Signed)
 PT to MRI. Breathing is even and unlabored. Limb Alert bracelet on right arm.

## 2023-10-27 NOTE — Progress Notes (Addendum)
 Progress Note  Patient Name: Jacob [name] Date of Encounter: 10/27/2023 [facility] Cardiologist: Kardie Tobb, DO   Interval Summary   Reported that he had palpitations that started yesterday morning while preparing to go to dialysis.  Had increased heart rates while at dialysis.  He goes to dialysis on Monday Wednesday Friday. Still has urine output.  Denies any nausea, vomiting, fever, diaphoresis, chest pain, lower extremity edema, orthopnea, melena, hematuria, and hematochezia.  Is unable to do more than 4 metabolic equivalents of exertion.  Patient's ability to walk is limited by his back pain.  Does report that he has some dyspnea on exertion. Also has a cough and frequent mucus production.  Vital Signs Vitals:   10/27/23 0430 10/27/23 0445 10/27/23 0500 10/27/23 0700  BP:   126/88 (!) 136/93  Pulse: 83 82 81 80  Resp:    (!) 22  Temp:      TempSrc:      SpO2: 96% 96% 97% 99%  Weight:      Height:       No intake or output data in the 24 hours ending 10/27/23 0750    10/26/2023    5:47 PM 07/07/2022    1:00 PM 07/07/2022    8:20 AM  Last 3 Weights  Weight (lbs) 180 lb 145 lb 11.6 oz 149 lb 4 oz  Weight (kg) 81.647 kg 66.1 kg 67.7 kg      Telemetry/ECG  Atrial flutter with a variable AV block and heart rates currently in the 80s.  When initially put on telemetry he was having heart rates in the 80s to 100s.- Personally Reviewed  Physical Exam  GEN: No acute distress.  Alert and orientated on room air. Neck: No JVD.  Bruits present bilaterally Cardiac: Irregularly irregular rhythm. no murmurs, rubs, or gallops.  Respiratory: Clear to auscultation bilaterally. GI: Soft, nontender, non-distended  MS: No edema  Assessment & Plan   Jacob [name] is a 78 y.o. 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 Gastric adenoma was initially discovered by biopsy in 2017.  From 2017-20 18 he received 5 cycles of chemotherapy.  He had a gastrectomy and Billroth II reconstruction. He was last seen by oncology on 12/2021 and was in remission at that time. Oncology was also following the patient for MGUS. Presented to the emergency department for epigastric pain and loose stools. Recommend following up with oncology Suspect he is likely a poor candidate for a TEE and will likely need to see GI first.    Anemia Patient has anemia this morning with a hemoglobin of 10.4.  The patient's anemia may be related to end-stage renal disease on HD.  It appears like this is about the patient's baseline.  Patient denies any melena, hematuria, and hematochezia. Recommend FOBT to evaluate for GI bleed   New onset atrial flutter HTN CHA2DS2-VASc Score = 6 [CHF History: 0, HTN History: 1, Diabetes History: 0, Stroke History: 2, Vascular Disease History: 1, Age Score: 2, Gender Score: 0].  Therefore, the patient's annual risk of stroke is 9.7 %.    EKG showed atrial flutter with a 2 is to 1 AV block and a ventricular rate of 119 Potassium 4.6, mag 2.1, TSH normal at 1.4. Heart rates are controlled in the 80s on telemetry. Start IV  heparin  and see how patient tolerates Ordered echocardiogram Continue verapamil  40mg  TID.   Coronary atherosclerosis Abnormal nuclear stress test Hyperlipidemia CT on 12/2021 showed coronary calcifications. Patient had chest pain on 11/2020. A nuclear stress test was done and showed evidence of ischemia.  The patient talked to his nephrologist about getting a cardiac catheterization because of his ESRD.  The patient decided to not proceed with the cardiac catheterization. Prior to admission was on aspirin  81 mg daily and Crestor  5 mg daily. Was previously on a higher dose of crestor  and patient reported having abdominal pain and GI upset with that. Stop  aspirin  given we are starting IV heparin  and will transition to OAC if tolerates. Continue Crestor  5mg  daily   OSA Had a sleep study done in 2015 and was diagnosed with OSA uses supplemental O2 at night.  Does not use CPAP because had difficulty with the mask always coming off. Continues to have poor sleep quality.   Carotid bruits. Order carotid ultrasound.   Signed, Morse Clause, PA-C  10/27/2023 8:45 AM     For questions or updates, please contact [facility] Please consult [email or web address] for contact info under        Signed, Laneah Luft, PA-C

## 2023-10-27 NOTE — ED Notes (Signed)
 PER MRI keep pt NPO.  Mri to be around 10:30 AM.

## 2023-10-27 NOTE — ED Notes (Signed)
 Per pharmacy in ED D10 and heparin  can be run together.

## 2023-10-27 NOTE — Assessment & Plan Note (Signed)
 CBG now mostly in 90s.  No history of diabetes, likely secondary to poor p.o. intake. Continuing D10 for now as patient is n.p.o. for MRCP -Will add diet and slowly wean from D10 when appropriate

## 2023-10-27 NOTE — Assessment & Plan Note (Signed)
 Patient is on MWF schedule, last dialysis was on Monday. -Nephrology was consulted to continue routine dialysis

## 2023-10-27 NOTE — Assessment & Plan Note (Addendum)
-   Continue home Effexor  and Wellbutrin

## 2023-10-27 NOTE — Progress Notes (Addendum)
 PHARMACY - ANTICOAGULATION CONSULT NOTE  Pharmacy Consult for heparin   Indication: atrial fibrillation  Allergies  Allergen Reactions   Lisinopril Cough    Patient Measurements: Height: 6' 1 (185.4 cm) Weight: 81.6 kg (180 lb) IBW/kg (Calculated) : 79.9 HEPARIN  DW (KG): 81.6  Vital Signs: Temp: 97.8 F (36.6 C) (10/14 0400) Temp Source: Oral (10/14 0400) BP: 136/93 (10/14 0700) Pulse Rate: 80 (10/14 0700)  Labs: Recent Labs    10/26/23 1749 10/26/23 2017 10/26/23 2220 10/27/23 0115  HGB 10.7*  --  10.5* 10.4*  HCT 33.5*  --  32.5* 31.2*  PLT 156  --   --  168  CREATININE 3.77*  --   --  4.57*  TROPONINIHS 26* 24*  --   --     Estimated Creatinine Clearance: 15.3 mL/min (A) (by C-G formula based on SCr of 4.57 mg/dL (H)).   Medical History: 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, has had transfusion   Anxiety and depression    Arthritis    gout   Back pain    BPH with obstruction/lower urinary tract symptoms    Chronic kidney disease    Stage 5 kidney   Constipation    ED (erectile dysfunction)    GERD (gastroesophageal reflux disease)    Hypercholesterolemia    Hypertension    Night sweats    Post-operative nausea and vomiting 04/09/2016   Prostate cancer (HCC) 08/14/2011   Adenocarcinoma,gleason:3+3=6,& 3+4=7,PSA=5.66, s/p radiation therapy   PUD (peptic ulcer disease)    Sleep apnea    does not use Cpap but occasionally uses O2 @ 2L qhs   Stomach cancer (HCC)    Ulcer    peptic ulcer hx    Assessment: Patient presenting with CC of abdominal pain, found to have new Afib. HgB 10.4 and PLTs 168. Not on anticoagulation PTA. Pharmacy consulted to dose heparin .   Goal of Therapy:  Heparin  level 0.3-0.7 units/ml Monitor platelets by anticoagulation protocol: Yes   Plan:  No bolus as patient was given heparin  subcutaneous at 06:00.  Start heparin  infusion at 1150 units/hr Check anti-Xa level in 8  hours and daily while on heparin  Continue to monitor H&H and platelets DOAC price checks pending.   Powell Blush, PharmD, BCCCP  10/27/2023,8:29 AM

## 2023-10-27 NOTE — Consult Note (Addendum)
 Consultation Note   Referring Provider:   [facility] PCP: Jacob Leni Edyth DELENA, MD Primary Gastroenterologist:  Previously Jacob Buddy, MD     Reason for Consultation: Abdominal pain, elevated LFTs DOA: 10/26/2023         Hospital Day: 2   Attending physician's note  I personally saw the patient and performed a substantive portion of the medical decision making process for this encounter (including a complete performance of the key components : MDM, Hx and Exam), in conjunction with the APP.  I agree with the APP's note, impression, and  the management plan for the number and complexity of problems addressed at the encounter for the patient and take responsibility for that plan with its inherent risk of complications, morbidity, or mortality with additional input as follows.    78 year old male presented to ER with acute upper abdominal pain radiating to the back, LFT abnormality concerning for biliary obstruction  History of gastric cancer, stage T3 N0 s/p distal gastrectomy with Billroth II reconstruction in March 2018, prostate CA s/p chemoradiation, end-stage renal disease on hemodialysis  MRCP suggestive of small distal CBD stone    Latest Ref Rng & Units 10/27/2023    1:15 AM 10/26/2023    5:49 PM 07/08/2022    1:09 AM  Hepatic Function  Total Protein 6.5 - 8.1 g/dL 6.0  6.5    Albumin  3.5 - 5.0 g/dL 2.8  3.0  2.6   AST 15 - 41 U/L 68  92    ALT 0 - 44 U/L 58  69    Alk Phosphatase 38 - 126 U/L 150  169    Total Bilirubin 0.0 - 1.2 mg/dL 1.7  2.4    Bilirubin, Direct 0.0 - 0.2 mg/dL 0.5        Patient reports improvement of abdominal pain, ?  Possible passage of small CBD stone  Dr. Avram [name] backup] will not be able to perform ERCP with Billroth II anatomy here.  Will have to consider transferring patient out to [facility]  Will request surgery to review if they would consider cholecystectomy and IOC but if  IOC is positive, patient will still need to be transferred to [facility] for ERCP  No signs of cholangitis, no leucocytosis or hemodynamically stable Soft diet as tolerated PPI twice daily Monitor daily LFT and CBC   The patient was provided an opportunity to ask questions and all were answered. The patient agreed with the plan and demonstrated an understanding of the instructions.  Jacob [name] , MD [phone]     ASSESSMENT    78 year old male with acute abdominal pain , elevated LFTs cholelithiasis, and MRCP suggesting possible choledocholithiasis.   Elevated HS troponin Atrial tachycardia Cardiology consulted, recommended continuation of verapamil .   Chronic Valdez anemia Baseline hgb difficult to discern with wide variations between 7 and 11 over the last year.  This admission hemoglobin is 10.7  Reported dark stools Stools darker than normal over the last 2 weeks.  Patient told that he may take oral iron but I do not see this on his home med list.  He has taken bismuth  products but believes stools intermittently dark prior to taking bismuth .  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   History of colon polyps Recommended to have 5-year surveillance colonoscopy in August 2026  Hypertension  ESRD on HD  MGUS  Gout  History of prostate cancer status post radiation in 2013  See PMH for any additional medical history  / medical problems   PLAN:   --Though liver chemistries have improved and pain spontaneously resolved he will still likely need ERCP for stone removal.  However, given Billroth II anatomy not sure we will be able to perform the ERCP -- Twice daily PPI --Monitor H/H  HPI   Jacob [name] was seen at urgent care yesterday for abdominal pain .  He was sent to the ED and was subsequently admitted for abdominal pain and elevated LFTs.  He began having generalized upper abdominal pain and back pain  on Saturday (3 days ago).  Pain was nearly constant.  No associated nausea or vomiting.  He has never experienced this type of pain before.  Patient does not take NSAIDs.  He denies changes in bowel habits, including diarrhea though it was mentioned in the H&P.  No blood in stool.  He has not had any fevers nor chills.  In the ED he was tachycardic.  High-sensitivity troponin elevated . Cardiology evaluated, he was started on metoprolol .  His blood sugar was low (felt to be from poor p.o. intake). Treated with dextrose  infusion.   Labs / studies notable for: WBC 5.7 Hemoglobin 10.7 MCV 98 Alk phos 169 >> 150 AST 92 >> 68 ALT 69 >> 58 total bilirubin 2.4 >> 1.7 ( indirect 1.2) Lipase 43 High-sensitivity troponin 26 >> 24  RUQ US  IMPRESSION: 1. Cholelithiasis, without sonographic evidence of acute cholecystitis. 2. Hepatic steatosis  CT AP with contrast  IMPRESSION: 1. No acute abdominal or pelvic process identified 2. Cholelithiasis 3. Bilateral renal cysts 4. Partially calcified right renal lesion, stable 5. Descending colonic diverticulosis 6. Abdominal aortic aneurysm measuring 3 cm, unchanged Recommend surveillance ultrasound in 3 years.  MRI / MRCP IMPRESSION: 1. Cholelithiasis without evidence of cholecystitis or biliary dilatation. Possible small filling defect dependently in the common bile duct just above the pancreatic head, suspicious for choledocholithiasis. Biliary assessment limited by breathing artifact. 2. 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 3. Atrophy of the pancreatic body and tail with mild focal ductal dilatation in the uncinate process. No evidence of pancreatic head mass or surrounding inflammation. 4. Stable dilatation of the infrarenal abdominal aorta to 2.9 cm. See follow up recommendations from abdominopelvic CT 10/26/2023. 5.  Numerous bilateral renal cysts are grossly stable. No specific follow-up imaging recommended for these lesions.   Pertinent GI Studies   August 2021 surveillance colonoscopy - Four 2 to 4 mm polyps in the sigmoid colon, removed with a cold snare. Resected and retrieved. - Diverticulosis in the sigmoid colon. - Non-bleeding internal hemorrhoids. - Stool in the sigmoid colon, in the ascending colon and in the cecum. - The examined portion of the ileum was normal.  A. COLON, SIGMOID, POLYPECTOMY:  - Colonic mucosa and food matter.  - No adenomatous change or carcinoma.  5-year follow-up colonoscopy recommended  Colonoscopy 06/2017 - Preparation of the colon was fair. - One 4 mm polyp in the cecum, removed with a cold biopsy forceps. Resected and retrieved. - Two 6 to 8 mm polyps in the descending colon and in the transverse  colon, removed with a cold snare. Resected and retrieved. - Moderate diverticulosis in the left colon. There was no evidence of diverticular bleeding. - Internal hemorrhoids. - The examination was otherwise normal on direct and retroflexion views   Labs and Imaging:  Recent Labs    10/26/23 1749 10/27/23 0115  PROT 6.5 6.0*  ALBUMIN  3.0* 2.8*  AST 92* 68*  ALT 69* 58*  ALKPHOS 169* 150*  BILITOT 2.4* 1.7*  BILIDIR  --  0.5*  IBILI  --  1.2*   Recent Labs    10/26/23 1749 10/26/23 2220 10/27/23 0115  WBC 5.7  --  4.6  HGB 10.7* 10.5* 10.4*  HCT 33.5* 32.5* 31.2*  MCV 98.2  --  96.6  PLT 156  --  168   Recent Labs    10/26/23 1749 10/27/23 0115  NA 135 133*  K 4.4 4.6  CL 104 101  CO2 20* 21*  GLUCOSE 64* 89  BUN 16 18  CREATININE 3.77* 4.57*  CALCIUM  8.2* 8.2*     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, has had transfusion   Anxiety and depression    Arthritis    gout   Back pain    BPH with obstruction/lower urinary tract symptoms    Chronic kidney disease    Stage 5 kidney    Constipation    ED (erectile dysfunction)    GERD (gastroesophageal reflux disease)    Hypercholesterolemia    Hypertension    Night sweats    Post-operative nausea and vomiting 04/09/2016   Prostate cancer (HCC) 08/14/2011   Adenocarcinoma,gleason:3+3=6,& 3+4=7,PSA=5.66, s/p radiation therapy   PUD (peptic ulcer disease)    Sleep apnea    does not use Cpap but occasionally uses O2 @ 2L qhs   Stomach cancer (HCC)    Ulcer    peptic ulcer hx    Past Surgical History:  Procedure Laterality Date   AV FISTULA PLACEMENT Right 02/19/2021   Procedure: ARTERIOVENOUS (AV) FISTULA CREATION;  Surgeon: Lanis Fonda BRAVO, MD;  Location: [facility] OR;  Service: Vascular;  Laterality: Right;   AV FISTULA PLACEMENT Right 04/21/2022   Procedure: INSERTION OF RIGHT ARM ARTERIOVENOUS (AV) GORE-TEX GRAFT;  Surgeon: Lanis Fonda BRAVO, MD;  Location: [facility] OR;  Service: Vascular;  Laterality: Right;   BASCILIC VEIN TRANSPOSITION Right 07/23/2021   Procedure: RIGHT ARM BASILIC VEIN TRANSPOSITION;  Surgeon: Lanis Fonda BRAVO, MD;  Location: [facility] OR;  Service: Vascular;  Laterality: Right;  PERIPHERAL NERVE BLOCK   colon polyps bx  11/24/06   colon,transverse and rectosigmoid polyps:tubular adenomas and hyperplastic polyps,no high grade dysplasia or malignancy    COLONOSCOPY WITH PROPOFOL  N/A 08/24/2019   Procedure: COLONOSCOPY WITH PROPOFOL ;  Surgeon: San Sandor GAILS, DO;  Location: [facility];  Service: Gastroenterology;  Laterality: N/A;   duodenal bx  11/24/06   benign   ESOPHAGOGASTRODUODENOSCOPY N/A 10/27/2015   Procedure: ESOPHAGOGASTRODUODENOSCOPY (EGD);  Surgeon: Lupita BRAVO Commander, MD;  Location: [facility];  Service: Endoscopy;  Laterality: N/A;   EUS N/A 11/08/2015   Procedure: UPPER ENDOSCOPIC ULTRASOUND (EUS) RADIAL;  Surgeon: Toribio SHAUNNA Cedar, MD;  Location: [facility];  Service: Endoscopy;  Laterality: N/A;   GASTRECTOMY N/A 03/25/2016   Procedure: DISTAL GASTRECTOMY;  Surgeon: Jina Nephew, MD;  Location:  [facility] OR;  Service: General;  Laterality: N/A;   gastric bx  11/24/06   chronic active gastritis,with metaplasia and focal changaes of xanthelasma   GASTROSTOMY  N/A 03/25/2016   Procedure: INSERTION OF FEEDING TUBE;  Surgeon: Jina Nephew, MD;  Location: [facility] OR;  Service: General;  Laterality: N/A;   INSERTION OF DIALYSIS CATHETER Left 04/21/2022   Procedure: INSERTION OF TUNNELED DIALYSIS CATHETER, LEFT INTERNAL JUGULAR;  Surgeon: Lanis Fonda BRAVO, MD;  Location: [facility] OR;  Service: Vascular;  Laterality: Left;   INSERTION PROSTATE RADIATION SEED  12-29-11   INTRAMEDULLARY (IM) NAIL INTERTROCHANTERIC Right 07/03/2022   Procedure: OPEN TREATMENT RIGHT HIP FRACTURE;  Surgeon: Elsa Lonni SAUNDERS, MD;  Location: [facility] OR;  Service: Orthopedics;  Laterality: Right;   IR GENERIC HISTORICAL  04/01/2016   IR GJ TUBE CHANGE 04/01/2016 Juliene Balder, MD [facility]   IR GENERIC HISTORICAL  04/05/2016   IR GJ TUBE CHANGE 04/05/2016 Juliene Balder, MD [facility]   LAPAROSCOPIC GASTRECTOMY  03/25/2016   Diagnostic laparoscopy, distal gastrectomy with Billroth 2 reconstruction and gastrojejunostomy tube   LAPAROSCOPY N/A 03/25/2016   Procedure: DIAGNOSTIC LAPAROSCOPY;  Surgeon: Jina Nephew, MD;  Location: [facility] OR;  Service: General;  Laterality: N/A;   POLYPECTOMY  08/24/2019   Procedure: POLYPECTOMY;  Surgeon: San Sandor GAILS, DO;  Location: [facility];  Service: Gastroenterology;;   PORTACATH PLACEMENT Left 12/04/2015   Procedure: INSERTION PORT-A-CATH;  Surgeon: Jina Nephew, MD;  Location: [facility] OR;  Service: General;  Laterality: Left;   PROSTATE BIOPSY  08/14/11   Adenocarcinoma/volume=58.51cc,gleason=3+3=6 & 3+4=7   TONSILLECTOMY     78 years old    Family History  Problem Relation Age of Onset   Cancer Mother        NOS   Alcohol abuse Father    Alcohol abuse Brother 31   Cancer Paternal Aunt        NOS   Colon cancer Neg Hx     Prior to Admission medications   Medication Sig Start Date End Date Taking?  Authorizing Provider  acetaminophen  (TYLENOL ) 500 MG tablet Take 1,000-1,500 mg by mouth daily as needed for moderate pain or headache.   Yes [provider]  allopurinol  (ZYLOPRIM ) 300 MG tablet Take 150 mg by mouth daily. 04/15/19  Yes [provider]  aspirin  EC 81 MG tablet Take 4 tablets (325 mg total) by mouth daily. Swallow whole. Patient taking differently: Take 81 mg by mouth daily. Swallow whole. 07/09/22  Yes Patel, Poonamkumari J, MD  buPROPion  (WELLBUTRIN  XL) 300 MG 24 hr tablet Take 300 mg by mouth daily.  05/10/15  Yes [provider]  cetirizine (ZYRTEC) 10 MG tablet Take 10 mg by mouth daily.   Yes [provider]  Cholecalciferol (VITAMIN D3) 10 MCG (400 UNIT) CAPS Take 1 capsule (400 Units total) by mouth daily. 04/21/22  Yes Schuh, McKenzi P, PA-C  furosemide  (LASIX ) 40 MG tablet Take 40 mg by mouth daily.   Yes [provider]  Melatonin 10 MG TABS Take 10 mg by mouth at bedtime.   Yes [provider]  polyethylene glycol powder (GLYCOLAX /MIRALAX ) 17 GM/SCOOP powder Take 17 g by mouth 2 (two) times daily. Until daily soft stools OTC 02/22/22  Yes Vicky Charleston, PA-C  rosuvastatin  (CRESTOR ) 5 MG tablet Take 5 mg by mouth daily. 02/05/21  Yes [provider]  sevelamer  carbonate (RENVELA ) 800 MG tablet Take 800 mg by mouth 3 (three) times daily with meals. 10/20/22  Yes [provider]  tamsulosin  (FLOMAX ) 0.4 MG CAPS capsule Take 1 capsule by mouth every day 05/05/23  Yes McKenzie, Belvie CROME, MD  venlafaxine  XR (EFFEXOR -XR) 75 MG  24 hr capsule Take 75 mg by mouth daily. 07/05/21  Yes [provider]  verapamil  (CALAN ) 40 MG tablet Take 40 mg by mouth 3 (three) times daily. 02/11/23  Yes [provider]  Nebivolol  HCl 20 MG TABS Take 20 mg by mouth at bedtime.    [provider]    Current Facility-Administered Medications  Medication Dose Route Frequency Provider Last Rate Last Admin    allopurinol  (ZYLOPRIM ) tablet 150 mg  150 mg Oral Daily Kakrakandy, Arshad N, MD       aspirin  EC tablet 81 mg  81 mg Oral Daily Franky Redia SAILOR, MD       buPROPion  (WELLBUTRIN  XL) 24 hr tablet 300 mg  300 mg Oral Daily Franky Redia SAILOR, MD       dextrose  10 % infusion   Intravenous Continuous Franky Redia SAILOR, MD 50 mL/hr at 10/27/23 0012 New Bag at 10/27/23 0012   heparin  ADULT infusion 100 units/mL (25000 units/250mL)  1,150 Units/hr Intravenous Continuous Tanda Powell ORN, [facility]       sevelamer  carbonate (RENVELA ) tablet 800 mg  800 mg Oral TID WC Franky Redia SAILOR, MD       tamsulosin  (FLOMAX ) capsule 0.4 mg  0.4 mg Oral Daily Kakrakandy, Arshad N, MD       venlafaxine  XR (EFFEXOR -XR) 24 hr capsule 75 mg  75 mg Oral Daily Franky Redia SAILOR, MD       verapamil  (CALAN ) tablet 40 mg  40 mg Oral TID Franky Redia SAILOR, MD       Current Outpatient Medications  Medication Sig Dispense Refill   acetaminophen  (TYLENOL ) 500 MG tablet Take 1,000-1,500 mg by mouth daily as needed for moderate pain or headache.     allopurinol  (ZYLOPRIM ) 300 MG tablet Take 150 mg by mouth daily.     aspirin  EC 81 MG tablet Take 4 tablets (325 mg total) by mouth daily. Swallow whole. (Patient taking differently: Take 81 mg by mouth daily. Swallow whole.) 30 tablet 0   buPROPion  (WELLBUTRIN  XL) 300 MG 24 hr tablet Take 300 mg by mouth daily.      cetirizine (ZYRTEC) 10 MG tablet Take 10 mg by mouth daily.     Cholecalciferol (VITAMIN D3) 10 MCG (400 UNIT) CAPS Take 1 capsule (400 Units total) by mouth daily. 30 capsule 0   furosemide  (LASIX ) 40 MG tablet Take 40 mg by mouth daily.     Melatonin 10 MG TABS Take 10 mg by mouth at bedtime.     polyethylene glycol powder (GLYCOLAX /MIRALAX ) 17 GM/SCOOP powder Take 17 g by mouth 2 (two) times daily. Until daily soft stools OTC 225 g 0   rosuvastatin  (CRESTOR ) 5 MG tablet Take 5 mg by mouth daily.     sevelamer  carbonate (RENVELA ) 800 MG tablet Take 800 mg  by mouth 3 (three) times daily with meals.     tamsulosin  (FLOMAX ) 0.4 MG CAPS capsule Take 1 capsule by mouth every day 30 capsule 11   venlafaxine  XR (EFFEXOR -XR) 75 MG 24 hr capsule Take 75 mg by mouth daily.     verapamil  (CALAN ) 40 MG tablet Take 40 mg by mouth 3 (three) times daily.     Nebivolol  HCl 20 MG TABS Take 20 mg by mouth at bedtime.      Allergies as of 10/26/2023 - Review Complete 10/26/2023  Allergen Reaction Noted   Lisinopril Cough 06/29/2008    Social History   Socioeconomic History   Marital status: Married  Spouse name: Orlean   Number of children: 3   Years of education: 12   Highest education level: Not on file  Occupational History   Occupation: retired    Associate Professor: [employer]  Tobacco Use   Smoking status: Former    Current packs/day: 0.00    Average packs/day: 1.5 packs/day for 30.0 years (45.0 ttl pk-yrs)    Types: Cigarettes    Start date: 04/14/1970    Quit date: 04/13/2000    Years since quitting: 23.5    Passive exposure: Past   Smokeless tobacco: Never  Vaping Use   Vaping status: Never Used  Substance and Sexual Activity   Alcohol use: Not Currently    Alcohol/week: 1.0 standard drink of alcohol    Types: 1 Cans of beer per week    Comment: a beer about once a week, less than in his youth   Drug use: No   Sexual activity: Not Currently  Other Topics Concern   Not on file  Social History Narrative   Patient is married Nickey) and lives at home with his wife and one child.   Patient has three children.   Patient is retired.   Patient has a high school education.   Patient is ambi-dextrous.   Patient drinks two cups of coffee about three times a week.    Social Drivers of Corporate investment banker Strain: Not on file  Food Insecurity: No Food Insecurity (07/02/2022)   Hunger Vital Sign    Worried About Running Out of Food in the Last Year: Never true    Ran Out of Food in the Last Year: Never true  Transportation Needs:  No Transportation Needs (07/02/2022)   PRAPARE - Administrator, Civil Service (Medical): No    Lack of Transportation (Non-Medical): No  Physical Activity: Not on file  Stress: Not on file  Social Connections: Not on file  Intimate Partner Violence: Not At Risk (07/02/2022)   Humiliation, Afraid, Rape, and Kick questionnaire    Fear of Current or Ex-Partner: No    Emotionally Abused: No    Physically Abused: No    Sexually Abused: No     Code Status   Code Status: Full Code  Review of Systems: All systems reviewed and negative except where noted in HPI.  Physical Exam: Vital signs in last 24 hours: Temp:  [97.8 F (36.6 C)-98.8 F (37.1 C)] 98.3 F (36.8 C) (10/14 0939) Pulse Rate:  [71-123] 80 (10/14 0700) Resp:  [14-29] 22 (10/14 0700) BP: (86-141)/(65-96) 136/93 (10/14 0700) SpO2:  [86 %-100 %] 99 % (10/14 0700) Weight:  [81.6 kg] 81.6 kg (10/13 1747)    General:  Pleasant male in NAD Psych:  Cooperative. Normal mood and affect Eyes: Pupils equal Ears:  Normal auditory acuity Nose: No deformity, discharge or lesions Neck:  Supple, no masses felt Lungs:  Clear to auscultation.  Heart:  Regular rate, regular rhythm.  Abdomen:  Soft, nondistended, nontender, active bowel sounds, no masses felt Rectal :  Deferred Msk: Symmetrical without gross deformities.  Neurologic:  Alert, oriented, grossly normal neurologically Extremities : No edema Skin:  Intact without significant lesions.    Intake/Output from previous day: No intake/output data recorded. Intake/Output this shift:  No intake/output data recorded.   Vina Dasen, NP-C   10/27/2023, 11:02 AM

## 2023-10-27 NOTE — Progress Notes (Signed)
 Carotid duplex  has been completed. Refer to [facility] under chart review to view preliminary results.   10/27/2023  12:14 PM Dandrea Widdowson, Ricka BIRCH

## 2023-10-27 NOTE — ED Notes (Signed)
 Upstairs contacted. Pt on the way up.

## 2023-10-28 LAB — MRSA NEXT GEN BY PCR, NASAL: MRSA by PCR Next Gen: NOT DETECTED

## 2023-10-28 NOTE — Progress Notes (Signed)
 Late Note Entry- October 28, 2023  Contacted [facility] to be advised that pt was transferred to another hospital for care.   Randine Mungo Dialysis Navigator [phone]

## 2023-10-29 LAB — HEPATITIS B SURFACE ANTIBODY, QUANTITATIVE: Hep B S AB Quant (Post): 273 m[IU]/mL

## 2023-10-30 NOTE — Discharge Summary (Signed)
 [facility] Discharge Summary  Admit Date: 10/28/2023 Discharge Date: 10/30/2023  Admitting Physician: Tawni Garr Hartigan, MD Discharge Physician: Geofm Carlin Puna, MD  Primary Care Provider: Maree Leni Gallon, MD, Phone [phone]  Discharge Destination: Home with home health: [facility]  Admission Diagnoses:  needs ERCP, has Bilroth II anatomy  Discharge Diagnoses:  Principal Problem:   Choledocholithiasis Active Problems:   Cholelithiasis with choledocholithiasis Resolved Problems:   * No resolved hospital problems. *  Primary Diagnosis: Admitted for concern of choledocholithiasis from OSH, now s/p laparoscopic cholecystectomy for cholelithiasis  Changes Made (with rationale):  Underwent laparoscopic cholecystectomy on 10/16 with intraoperative cholangiogram showing no filling defects and biliary tree, with normalization of transaminase and bilirubin elevation Underwent maintenance HD on 10/15 and 10/17 Discharged with ambulatory cardiac monitor for reported episode of atrial flutter noted at OSH (unable to find EKG evidence in paperwork from OSH, no redemonstration of atrial arrhythmia on telemetry here) Risk-benefit discussion on anticoagulation given uncertain history of atrial arrhythmia held with patient preferring to hold off on AC at this time pending amatory cardiac monitor results Dose reduced to bupropion  to 150 mg daily and allopurinol  to 150 mg daily given ESRD  To-Do List (incidental findings, follow-up studies, etc.): Incidentally found to have bilateral renal cysts and abdominal aortic ectasia 2.8 cm on outside abdominal imaging requiring attention on follow-up Please repeat CBC and CMP about 1 week after discharge Please follow-up cardiac event monitor results especially for any detected atrial arrhythmia that may warrant anticoagulation (CHA2DS2-VASc at least 2 given age) 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   Follow-up/Care Transition Plan: Sched. appts: No future appointments.  Follow-up info: [facility] [address] [place]  [id number] [phone]   Surgery follow-up requested as noted above     Allergies/Intolerances:  Allergies  Allergen Reactions  . Lisinopril Cough and Unknown     New Adverse Drug Events: none  Medications:     Current Discharge Medication List     These medications have been CHANGED      Instructions  allopurinoL  100 MG tablet Refills: 0 What changed: how much to take  Commonly known as: ZYLOPRIM  Take 1.5 tablets (150 mg total) by mouth once daily Last time this was given: 150 mg on October 30, 2023  1:28 PM   buPROPion  150 MG XL tablet Quantity: 30 tablet Refills: 0 What changed:  medication strength how much to take  Commonly known as: WELLBUTRIN  XL Take 1 tablet (150 mg total) by mouth once daily Last time this was given: 150 mg on October 30, 2023  1:29 PM   cetirizine 10 MG chewable tablet Refills: 0 What changed: how much to take  Commonly known as: ZyrTEC Take 0.5 tablets (5 mg total) by mouth once daily       CONTINUE taking these medications      Instructions  aspirin  81 MG chewable tablet Refills: 0  Take 81 mg by mouth once daily   cholecalciferol 400 unit tablet Refills: 0  Commonly known as: VITAMIN D3 Take 800 Units by mouth once daily Last time this was given: 800 Units on October 30, 2023  1:29 PM  pantoprazole  40 MG DR tablet Refills: 0  Commonly known as: PROTONIX  Take 40 mg by mouth 2 (two) times daily before meals Last time this was given: 40 mg on October 30, 2023  1:28 PM   sevelamer  carbonate 800 mg tablet Refills: 0  Commonly known as: RENVELA  Take 3 tablets by mouth 3 (three) times daily with meals Last time this was given: 800 mg on October 30, 2023  1:27 PM   tamsulosin  0.4 mg capsule Refills: 0  Commonly known as: FLOMAX  Take 0.4 mg by mouth once daily Take 30 minutes after same meal each day. Last time this was given: 0.4 mg on October 30, 2023  1:28 PM   venlafaxine  75 mg ER tablet Refills: 0  Take 75 mg by mouth once daily Last time this was given: Ask your nurse or doctor         History of Present Illness:  From H&P by Dr. Nada dated 10/28/2023: Jacob [name] is a 78 y.o. 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 Transfer to [facility] was requested given complex anatomy I/s/o prior distal gastrectomy/overall high risk.    He has overall been hemodynamically stable and afebrile since presentation. He was noted to have likely new aflutter on presentation that was rate controlled with IV fluid resuscitation and PO metoprolol . Since CHADS-VASC 6, was started on heparin  gtt with anticipated possible ERCP. He was also noted to be intermittently hypoglycemic (to the 60s) requiring dextrose , but no changes in mental status, fevers or chills. On arrival to the unit, patient has no acute complaints.     He corroborated reported history from [facility] health EMR that he had presented with abdominal pain and decreased appetite and was aware that they were considering doing a procedure to address the choledocholithiasis.  He did state that over the past several months he has had some dysphagia without any  aspiration events and thinks he has lost a small amount of weight during this time.SABRA  He currently denies any nausea, vomiting, fever or chills.  He states that he initially had some loose stools but this had resolved and had a normal bowel movement yesterday.  He states he has never had a abnormal heart rhythm before to his knowledge without a history of atrial flutter or A-fib and is not currently on Eliquis or Xarelto in the outpatient setting.  He states when he had the A-flutter at [facility] health he could feel that his heart was going quicker than normal but this is since resolved.  He states he is on verapamil  at an outpatient setting for hypertension that he takes 3 times a day.  He does not have a history of heart attack or stroke but is on aspirin  81 mg.  Rolled to the floor, heparin  GTT running at 1050 units/hr with D10 running at 50 mL/h.  He does get dialysis Monday Wednesday Friday with last HD on Monday prior to transfer.  Last p.o. intake was yesterday afternoon (10/14) around 4 PM.   On presentation to the ED, vitals were T 36.6 C (97.9 F), HR 69, BP 125/75, RR 18, and satting 99 % on RA. CBC 14 with white count of  4.6, hemoglobin of 10.4, platelets 168.  Known ESRD with recent BMP with creatinine 4.57 K4.6, NA 133, bicarb 21, LFTs 10/13 (ALP 169 AST 92 ALT 69 Tbili 2.4) downtrending on most recent LFTs at OSH (ALP 150 AST 68 ALT 58 Tbili 1.7).  With new onset A-flutter, had TSH performed which was normal 10/14 1.404.  MRCP on 10/14 with cholelithiasis w/ concern for choledocholithiasis with small filling defect in CBD just above pancreatic head without any associated cholecystitis.  Also noted to have atrophy of the pancreatic body/tail with mild focal ductal dilatation of the uncinate process without any obvious mass or surrounding inflammation. _____________________   Hospital Course by Problem: Deionte Spivack is a 78 y.o. male with PMH ESRD on HD MWF (RUE AVG), hypertension, gastric  adenocarcinoma s/p distal gastrectomy & chemotherapy in remission, prostate cancer s/p XRT 2013, MGUS who was transferred from [facility] health for ERCP out of c/f choledocholithiasis. Now s/p laparoscopic cholecystectomy for cholelithiasis.   # Cholelithiasis s/p laparoscopic cholecystectomy 10/29/23  # Hx of gastric adenocarcinoma s/p distal gastrectomy  Presented to [facility] for epigastric abdominal pain and decreased appetite, found to have elevated LFTs (ALP 169 AST 92 ALT 69 Tbili 2.4) with MRCP concerning for choledocholithiasis (small filling defect in CBD). On arrival remained HDS, afebrile with benign abdominal exam. GI biliary consulted for possible ERCP, deferred given downtrending LFTs and no overt choledocholithiasis on MRCP over read. Gen surg consulted for cholecystectomy given evidence of cholelithiasis, which he underwent on 10/16. Intraoperative cholangiogram demonstrated that while there was not robust emptying of contrast into the duodenum initially, there were no filling defects and contrast was eventually seen in the duodenum. These findings, along with normalization of LFTs, were reassuring against retained stones. Patient felt well post procedure and able to discharge home after HD on 10/17.  - Continued home Protonix  40 mg BID for GERD after prior distal gastrectomy - Acute care surgery to follow up virtually within 2 weeks [ ]  Please repeat CBC and CMP in 1-2 weeks after discharge   # ESRD on HD via RUE AVG, MWF  Etiology hypertension per patient, last HD session before presentation Monday 10/13 w/o issue. No electrolyte abnormalities noted at OSH on labs 10/14. Nephrology consulted for maintenance HD, underwent HD on 10/15 and 10/17 without issues. Will resume home HD on 10/20 - Continue home sevelamer  2400 mg TID - Continue home vitamin D3 800 units daily   # Possible Paroxysmal Atrial Flutter # HTN New atrial flutter reported at OSH for which cardiology was consulted.  TSH  was within normal limits 1.4.  Echocardiogram was ordered but not yet resulted (unclear if actually completed). He was started on metoprolol  tartrate 25 mg twice daily for rate control (OSH reported discontinuation of home verapamil , unclear that he has been taking this in outpatient setting from fill history). CHADs-VASC documented as 6, but on further discussion with patient, believe it is more around 2-3 (for age and prior hx of HTN not currently requiring medication; no definitive CVA hx). Initially started on heparin  gtt at OSH, discontinued after risk/benefit discussion given limited evidence of atrial arrhythmia with normal sinus rhythm on tele. Also discussed whether or not he needs to be on longterm AC given that he has not had any recurrent atrial flutter or AF on tele. He and his family decided that they would like to do a cardiac monitor on discharge to see if he has any recurrence, then decide on [facility] based on the results. -  Cardiac event monitor placed on discharge [ ]  Please follow up results of monitor to determine indication for antiarrhythmic/rate control therapy, anticoagulation - Discontinued metoprolol  tartrate 25 mg BID on discharge (not taking prior to OSH hospitalization)  # Hypoglycemia, resolved Noted at OSH presentation in setting of decreased p.o. intake and abdominal pain.  On arrival here he was on D10 drip with BG of 75, asymptomatic.  Discontinued D10 drip following surgery as diet was resumed without recurrence of hypoglycemia -renal diet   # Hx of prostate cancer s/p XRT  # BPH - Continued home tamsulosin  0.4 mg   # Gout  - Home allopurinol  dose-reduced from 300 mg to 150 mg QD   # Anxiety/depression  - Continued home Effexor  75 mg daily  - Home Wellbutrin  dose reduced from 300 mg daily to 150 mg given ESRD   # Chronic small vessel disease  No focal weakness on exam or noted memory deficits on arrival.  - Home ASA 81 mg held periprocedurally, resumed on  discharge - Resumed home rosuvastatin  10 mg on discharge given improvement in transaminases   # Dysphagia  # Edentulous  Patient wears dentures at baseline, but did not bring them to the hospital. Notes a hx of difficulty swallowing over the past few months but denies any aspiration events. Tolerated renal diet this admission.  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 IFE with IgG monoclonal protein with lambda light chain specificity. -CTM in the outpatient setting    #Renal cysts Bilateral renal cysts, some of which are hemorrhagic/proteinaceous noted on interpretation of outside MRI abdomen and CT abdomen. 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 -attention on f/u    Social Drivers of Health with Concerns   Tobacco Use: Medium Risk (10/27/2023)   Received from [facility]   Patient History   . Smoking Tobacco Use: Former   . Smokeless Tobacco Use: Never   . Passive Exposure: Past  Transportation Needs: Unknown (10/28/2023)   PRAPARE - Transportation   . Lack of Transportation (Medical): No  Housing Stability: Unknown (10/30/2023)   Housing Stability Vital Sign   . Unable to Pay for Housing in the Last Year: No   . Homeless in the Last Year: No    Surgeries and Procedures Performed:  Procedure(s): LAPAROSCOPY, SURGICAL; CHOLECYSTECTOMY 10/29/2023: OPERATIVE FINDINGS: No signs of cholecystitis. Expected adhesions from prior surgeries.   OPERATIVE REPORT:   The patient was placed on the operating table in the supine position with a foot board in place; left upper extremity was tucked and restraining straps were attached. Antibiotics were administered, and sequential compression devices placed for prophylaxis.  General anesthesia was induced; the abdomen was prepped and draped aseptically.  Time-out was completed verifying correct  patient, procedure, site, positioning, equipment/instruments and allergies. Entry into the peritoneal cavity was established via a Veress needle insertion in the RUQ midclavicular. Pneumoperitoneum was established with inflation of carbon dioxide to a pressure of 15 mmHg through the Veress needle. 0.25% Marcaine  was injected and a 5mm trocar was inserted in the lateral RUQ via optiview.  The laparoscope was inserted and general exploration revealed the above findings; there was no intraperitoneal injury from Veress needle insertion. 0.25% Marcaine  was injected, and two more 5mm trocars and a 12mm trocar were placed under direct visualization in the subcostal and subxyphoid positions. Care was taken to avoid injury  to the intestines and epigastric vessels. The table was placed in the reverse Trendelenburg position with right side elevated.   Using a locking grasper, the gallbladder was retracted cephalad.  Retracting the fundus cephalad and the infundibulum laterally, omental attachments were dissected off the gallbladder and the cystic duct and artery were identified using the critical view method.  The cystic artery was doubly clipped proximally and clipped distally then divided. Using the Kumar clamp, the distal infundibulum/hartman's pouch was grasped making sure the cystic duct was appropriately aligned and then the cholangiocatheter was advanced through the Kumar clamp and the cystic duct was accessed. Cholangiogram was then performed via injection of contrast with fluoroscopic images obtained which demonstrated that while there was not robust emptying of contrast into duodenum initially, there were no filling defects and contrast was eventually seen in the duodenum. Given that his T bili was 1.1 today, 1.0 yesterday and AST/ALT/Alk phos have since been normal, we are reassured that there is no evidence of retained stones. The cystic duct then was doubly clipped proximally and clipped distally then  divided. Next Gallbladder was dissected from the liver bed, using hooked bovie for hemostasis.  While dissecting the gallbladder of the cystic plate, the gallbladder wall was inadvertently penetrated with spillage of bile and stones; these were collected and suctioned up with proper irrigation. After aspirating until clear, hemostasis was confirmed, and gallbladder was placed in the pouch then removed through the subxyphoid 12 mm trocar.  Pneumoperitoneum was deflated while removing trocars under vision.  Sponge, needle and instrument counts were correct.  The 12 mm port site was closed with running 0 Vicryl suture.  Next, skin was approximated using 4 Monocryl suture and skin glue. Final sponge, needle and instrument counts were correct, and no radiofrequency markers were identified. The patient was awakened, extubated and transferred to recovery in stable condition.   No evidence of infection present       ESTIMATED BLOOD LOSS: minimal _____________________  Discharge Exam:  BP 137/84 (BP Location: Left upper arm, Patient Position: Lying)   Pulse 98   Temp 36.4 C (97.5 F) (Oral)   Resp 18   Ht 185.4 cm (6' 1)   Wt 84 kg (185 lb 3 oz)   SpO2 100%   BMI 24.43 kg/m    General: Sitting comfortably at dialysis. Non-toxic appearing. In no acute distress. HEENT: normocephalic, atraumatic Pulm: Normal work of breathing on RA CV: Normal rate. Regular rhythm. No murmurs or rubs appreciated.  Abdomen: Soft. Non-distended. No tenderness to palpation Skin: No rashes or lesions seen on exposed skin. Well-healing laparoscopy sites. Extremities: No lower extremity edema bilaterally. Receiving HD via RUE AVF. Neuro: Alert and oriented, speech fluent. Grossly normal use of all extremities.  Psych: Alert, oriented, and appropriate.   Pertinent Lab Testing: Recent Labs  Lab 10/28/23 0406 10/29/23 0512 10/30/23 0626  NA 128* 133* 133*  K 4.3 4.0 4.3  CL 95* 97* 95*  CO2 21 27 25   BUN 29* 12  20  CREATININE 6.2* 3.8* 5.5*  GLUCOSE 68* 81 72  CALCIUM  8.9 9.0 9.3   Recent Labs  Lab 10/28/23 0406 10/29/23 0512 10/30/23 0626  AST 35 22 30  ALT 40 30 19  ALKPHOS 123* 104 105  TBILI 1.0 1.1 1.4    Recent Labs  Lab 10/28/23 0406 10/29/23 0512 10/30/23 0626  WBC 5.0 3.0* 7.1  HGB 9.7* 9.5* 10.0*  HCT 29.1* 27.8* 30.9*  PLT 142* 132* 188   Recent  Labs  Lab 10/28/23 0406  APTT 79.0*  INR 1.0     Other Pertinent Labs:  HBsAb pos, HBsAg neg, HCV Ab neg  Micro:  None    Pertinent Imaging:   X-ray fluoro less than 1 hour Result Date: 10/29/2023 This order has been auto-finalized. Please see additional clinical documentation for result report.  CT abdominal outside interpretation Result Date: 10/28/2023  **INTERPRETATION OF OUTSIDE IMAGING** INDICATIONS: I48.20 Chronic atrial fibrillation, unspecified (CMS/HHS-HCC) COMPARISONS: None TECHNIQUE: - Patient full name: QUINTON SLADE - Date of Original Study: October 26, 2023, [facility]; interpreted on October 28, 2023 at [facility] - Study: CT abdomen and pelvis with contrast FINDINGS: - Lower Thorax: No suspicious pulmonary abnormalities. No pleural or pericardial effusions. - Liver: Normal in morphology and enhancement.  No suspicious hepatic masses are identified.  The portal and hepatic veins are patent. - Biliary and Gallbladder: No intrahepatic or extrahepatic bile duct dilatation. Subtle gallbladder wall thickening. Cholelithiasis. Normal appearance of the common bile duct. For better evaluation of the biliary system attention on forthcoming MRCP outside interpretation. - Spleen: Normal in appearance.  - Pancreas: Normal in appearance. - Adrenal Glands: Normal in appearance. - Kidneys: Bilateral renal cysts. There is a dominant right superior pole cyst that measures 5.9 x 6.2 cm. Partially calcified low-density lesion in the right interpolar region may reflect a partially calcified cyst. While  these are favored to reflect cysts, for further evaluation of multiple bilateral renal lesions, additional forthcoming abdominal MRI interpretation. No definite suspicious renal lesions. No hydronephrosis. - Abdominal Vasculature: No abdominal aortic aneurysm. - Gastrointestinal Tract: No abnormal dilation or wall thickening. Diverticulosis. Post surgical changes status post distal gastrectomy and Billroth reconstruction. Normal appearance of the gastrojejunal anastomosis. - Peritoneum/Mesentery/Retroperitoneum: No free fluid.  No free intraperitoneal air. - Lymph Nodes: No retroperitoneal or  mesenteric lymphadenopathy.  - Body Wall: Unremarkable. - Musculoskeletal:  No aggressive appearing osseous lesions. Postsurgical changes about the right femur. Degenerative changes about the lumbar spine with trace anterolisthesis of L3 on L4. IMPRESSION: 1.  Bilateral renal lesions are favored to reflect cysts, however for further assessment, attention on forthcoming MRI of the abdomen outside interpretation. 2.  Normal CT appearance of the common bile duct and intrahepatic ducts. For further evaluation, attention on forthcoming MRCP outside interpretation. 3.  Subtle thickening of the gallbladder wall with cholelithiasis. No gallbladder distention or pericholecystic stranding to suggest acute cholecystitis. Please note: Our interpretation of studies performed at an outside institution is limited by factors including absence of technical specifics of the image, undisclosed clinical information and the unavailability of the original interpretation.  Specialists at the institution that performed this study may have access to information not available to us  that could make a difference in the interpretation.  We suggest that you obtain the original interpretation from the site where the study was performed. Electronically Reviewed by:  Tanda Moose, MD, [facility] Electronically Reviewed on:  10/28/2023 4:20 PM I have  reviewed the images and concur with the above findings. Electronically Signed by:  Marin Edu, MD, [facility] Electronically Signed on:  10/28/2023 4:25 PM  MRI abdominal outside interpretation Result Date: 10/28/2023  **INTERPRETATION OF OUTSIDE IMAGING** INDICATIONS: I48.20 Chronic atrial fibrillation, unspecified (CMS/HHS-HCC) COMPARISONS: 10/26/2023 CT abdomen pelvis with contrast TECHNIQUE: - Patient full name: QUINTON SLADE - Date of Original Study: 10/27/2023; submitted for interpretation on 10/28/2023 - Study: MRI abdomen without contrast and MRCP. - Study Technique: Noncontrast MR imaging of the abdomen was performed. MRCP  was also performed. FINDINGS: - Lower Thorax: No suspicious pulmonary abnormalities. No pleural or pericardial effusions. - Liver: Iron deposition.  - Biliary and Gallbladder: No biliary ductal dilatation. Query a few small linear filling defects within the distal common bile duct, morphologically similar in appearance to patient's gallstones (series 3; images 22/02/2022). Cholelithiasis. - Spleen: Iron deposition.  - Pancreas: Mild prominence of the proximal pancreatic duct with upstream tapering, measuring up to 1 cm (series 4; image 15). No evidence of pancreatic head mass. - Adrenal Glands: Normal in appearance. - Kidneys: Bilateral renal cysts, some of which are hemorrhagic/proteinaceous. No hydronephrosis. - Abdominal Vasculature: Infrarenal abdominal aortic ectasia measuring up to 2.8 cm. - Gastrointestinal Tract: No abnormal dilation or wall thickening in the field of view. - Peritoneum/Mesentery/Retroperitoneum: No free fluid in the field of view. - Lymph Nodes: No retroperitoneal or mesenteric lymphadenopathy.  - Body Wall: Unremarkable. - Musculoskeletal:  No aggressive appearing osseous lesions. IMPRESSION: 1.  A few tiny linear filling defects within the distal common bile duct may reflect choledocholithiasis. No associated biliary ductal dilatation. 2.   Nonspecific prominence of pancreatic duct in the pancreatic head with upstream tapering. Please note: Our interpretation of studies performed at an outside institution is limited by factors including absence of technical specifics of the image, undisclosed clinical information and the unavailability of the original interpretation.  Specialists at the institution that performed this study may have access to information not available to us  that could make a difference in the interpretation.  We suggest that you obtain the original interpretation from the site where the study was performed. Electronically Reviewed by:  Rumalda Kaplan, MD, [facility] Electronically Reviewed on:  10/28/2023 11:12 AM I have reviewed the images and concur with the above findings. Electronically Signed by:  Randine Dunnings, MD, [facility] Electronically Signed on:  10/28/2023 3:55 PM  Request for image library services Result Date: 10/27/2023 Please refer to the appropriate PACS to view images.   CT Abdomen Pelvis Reference Only Result Date: 10/27/2023 This order has been auto-finalized.  Please see additional clinical documentation for result report.   MRI abdomen reference only Result Date: 10/27/2023 This order has been auto-finalized.  Please see additional clinical documentation for result report.   Ultrasound abdomen reference only Result Date: 10/27/2023 This order has been auto-finalized.  Please see additional clinical documentation for result report.   X-ray chest reference only Result Date: 10/27/2023 This order has been auto-finalized.  Please see additional clinical documentation for result report.    _____________________  Code Status: Full Code Goals of care were not addressed during this admission.   Status on Discharge:  Current activity: Walks occasionally (10/29/23 2100) Current mobility: No limitation (10/29/23 2100)  Activity Recommendation: activity as tolerated  Other Discharge  Instructions: Services setup at discharge: Home Health PT/OT Tubes/lines at discharge: None  Diet: Diet renal  Wound Care Order Instructions     None       _____________________  Time spent on discharge process: 35 minutes    KEVIN MILDER, MD [facility]  10/30/2023   Hospital Contact Information:  [facility]) [facility]) [facility] [employer] [facility])  Pending tests:  Laboratory: [phone] Microbiology: [phone] Pathology: [phone] Radiology: [phone]  General questions: [phone] Pending tests: Laboratory: [phone] Microbiology: [phone] Pathology: [phone] Radiology: [phone]  General questions:  [phone] Pending tests:  Laboratory: [phone] Microbiology: [phone] Pathology: [phone] Radiology: [phone])  [phone]  General questions:  [phone] Pending tests: Laboratory: [phone] Microbiology: [phone] Pathology: [phone] Radiology: [phone]  General questions:  [phone]    ------------------------------------------------------------------------------- Attestation signed by Geofm Carlin Puna, MD at 10/30/2023  3:28 PM Attestation Statement:   I personally saw and evaluated the patient, and participated in the management and treatment plan as documented in the resident/fellow note.  Carlin Puna Geofm, MD  -------------------------------------------------------------------------------

## 2024-01-08 ENCOUNTER — Encounter: Payer: Self-pay | Admitting: Physician Assistant

## 2024-02-09 ENCOUNTER — Ambulatory Visit: Admitting: Physician Assistant

## 2024-03-04 ENCOUNTER — Ambulatory Visit: Admitting: Physician Assistant

## 2024-03-08 ENCOUNTER — Ambulatory Visit: Admitting: Physician Assistant
